# Patient Record
Sex: Female | Born: 1935 | Race: White | Hispanic: No | Marital: Married | Smoking: Never smoker
Health system: Southern US, Community
[De-identification: ages and names within clinical notes are randomized; demographics above are authoritative.]

## PROBLEM LIST (undated history)

## (undated) ENCOUNTER — Ambulatory Visit: Admission: EM | Payer: Medicare Other | Source: Home / Self Care

## (undated) DIAGNOSIS — R112 Nausea with vomiting, unspecified: Secondary | ICD-10-CM

## (undated) DIAGNOSIS — E785 Hyperlipidemia, unspecified: Secondary | ICD-10-CM

## (undated) DIAGNOSIS — N183 Chronic kidney disease, stage 3 unspecified: Secondary | ICD-10-CM

## (undated) DIAGNOSIS — F0390 Unspecified dementia without behavioral disturbance: Secondary | ICD-10-CM

## (undated) DIAGNOSIS — G4730 Sleep apnea, unspecified: Secondary | ICD-10-CM

## (undated) DIAGNOSIS — I10 Essential (primary) hypertension: Secondary | ICD-10-CM

## (undated) DIAGNOSIS — M1990 Unspecified osteoarthritis, unspecified site: Secondary | ICD-10-CM

## (undated) DIAGNOSIS — IMO0002 Reserved for concepts with insufficient information to code with codable children: Secondary | ICD-10-CM

## (undated) DIAGNOSIS — G43909 Migraine, unspecified, not intractable, without status migrainosus: Secondary | ICD-10-CM

## (undated) DIAGNOSIS — L570 Actinic keratosis: Secondary | ICD-10-CM

## (undated) DIAGNOSIS — R51 Headache: Secondary | ICD-10-CM

## (undated) DIAGNOSIS — I872 Venous insufficiency (chronic) (peripheral): Secondary | ICD-10-CM

## (undated) DIAGNOSIS — K449 Diaphragmatic hernia without obstruction or gangrene: Secondary | ICD-10-CM

## (undated) DIAGNOSIS — I43 Cardiomyopathy in diseases classified elsewhere: Secondary | ICD-10-CM

## (undated) DIAGNOSIS — I428 Other cardiomyopathies: Secondary | ICD-10-CM

## (undated) DIAGNOSIS — I4819 Other persistent atrial fibrillation: Secondary | ICD-10-CM

## (undated) DIAGNOSIS — I4892 Unspecified atrial flutter: Secondary | ICD-10-CM

## (undated) DIAGNOSIS — I4821 Permanent atrial fibrillation: Secondary | ICD-10-CM

## (undated) DIAGNOSIS — Z9889 Other specified postprocedural states: Secondary | ICD-10-CM

## (undated) DIAGNOSIS — R55 Syncope and collapse: Secondary | ICD-10-CM

## (undated) DIAGNOSIS — R000 Tachycardia, unspecified: Secondary | ICD-10-CM

## (undated) DIAGNOSIS — K219 Gastro-esophageal reflux disease without esophagitis: Secondary | ICD-10-CM

## (undated) DIAGNOSIS — R079 Chest pain, unspecified: Secondary | ICD-10-CM

## (undated) DIAGNOSIS — I441 Atrioventricular block, second degree: Secondary | ICD-10-CM

## (undated) DIAGNOSIS — M898X9 Other specified disorders of bone, unspecified site: Secondary | ICD-10-CM

## (undated) DIAGNOSIS — R42 Dizziness and giddiness: Secondary | ICD-10-CM

## (undated) HISTORY — DX: Sleep apnea, unspecified: G47.30

## (undated) HISTORY — PX: COMBINED AUGMENTATION MAMMAPLASTY AND ABDOMINOPLASTY: SUR291

## (undated) HISTORY — DX: Other cardiomyopathies: I42.8

## (undated) HISTORY — DX: Diaphragmatic hernia without obstruction or gangrene: K44.9

## (undated) HISTORY — PX: CARDIOVERSION: SHX1299

## (undated) HISTORY — DX: Chronic kidney disease, stage 3 unspecified: N18.30

## (undated) HISTORY — DX: Hyperlipidemia, unspecified: E78.5

## (undated) HISTORY — DX: Atrioventricular block, second degree: I44.1

## (undated) HISTORY — DX: Essential (primary) hypertension: I10

## (undated) HISTORY — DX: Chronic kidney disease, stage 3 (moderate): N18.3

## (undated) HISTORY — DX: Cardiomyopathy in diseases classified elsewhere: R00.0

## (undated) HISTORY — DX: Reserved for concepts with insufficient information to code with codable children: IMO0002

## (undated) HISTORY — DX: Migraine, unspecified, not intractable, without status migrainosus: G43.909

## (undated) HISTORY — DX: Other specified disorders of bone, unspecified site: M89.8X9

## (undated) HISTORY — DX: Venous insufficiency (chronic) (peripheral): I87.2

## (undated) HISTORY — PX: TOTAL KNEE ARTHROPLASTY: SHX125

## (undated) HISTORY — PX: CARDIAC CATHETERIZATION: SHX172

## (undated) HISTORY — DX: Actinic keratosis: L57.0

## (undated) HISTORY — DX: Syncope and collapse: R55

## (undated) HISTORY — DX: Other persistent atrial fibrillation: I48.19

## (undated) HISTORY — PX: APPENDECTOMY: SHX54

## (undated) HISTORY — PX: NOSE SURGERY: SHX723

## (undated) HISTORY — DX: Tachycardia, unspecified: I43

## (undated) HISTORY — DX: Permanent atrial fibrillation: I48.21

## (undated) HISTORY — DX: Headache: R51

## (undated) HISTORY — PX: OTHER SURGICAL HISTORY: SHX169

## (undated) HISTORY — DX: Unspecified atrial flutter: I48.92

## (undated) HISTORY — PX: CHOLECYSTECTOMY: SHX55

---

## 1984-12-20 HISTORY — PX: MASTECTOMY: SHX3

## 1984-12-20 HISTORY — PX: AUGMENTATION MAMMAPLASTY: SUR837

## 1988-12-20 HISTORY — PX: AUGMENTATION MAMMAPLASTY: SUR837

## 1988-12-20 HISTORY — PX: ABDOMINAL HYSTERECTOMY: SHX81

## 2003-11-30 ENCOUNTER — Ambulatory Visit (HOSPITAL_COMMUNITY): Admission: RE | Admit: 2003-11-30 | Discharge: 2003-11-30 | Payer: Self-pay | Admitting: Internal Medicine

## 2004-05-14 ENCOUNTER — Other Ambulatory Visit: Payer: Self-pay

## 2004-05-15 ENCOUNTER — Other Ambulatory Visit: Payer: Self-pay

## 2004-12-24 ENCOUNTER — Ambulatory Visit: Payer: Self-pay | Admitting: Otolaryngology

## 2005-01-19 ENCOUNTER — Ambulatory Visit: Payer: Self-pay | Admitting: Otolaryngology

## 2005-01-27 ENCOUNTER — Ambulatory Visit: Payer: Self-pay | Admitting: Otolaryngology

## 2005-05-06 ENCOUNTER — Ambulatory Visit: Payer: Self-pay | Admitting: Internal Medicine

## 2005-08-04 ENCOUNTER — Emergency Department: Payer: Self-pay | Admitting: Unknown Physician Specialty

## 2005-08-04 ENCOUNTER — Other Ambulatory Visit: Payer: Self-pay

## 2005-11-22 ENCOUNTER — Ambulatory Visit: Payer: Self-pay | Admitting: Pulmonary Disease

## 2005-12-16 ENCOUNTER — Ambulatory Visit: Payer: Self-pay | Admitting: Pulmonary Disease

## 2006-03-08 ENCOUNTER — Observation Stay (HOSPITAL_COMMUNITY): Admission: AD | Admit: 2006-03-08 | Discharge: 2006-03-09 | Payer: Self-pay | Admitting: Cardiovascular Disease

## 2006-03-16 ENCOUNTER — Ambulatory Visit: Payer: Self-pay | Admitting: Unknown Physician Specialty

## 2006-03-21 ENCOUNTER — Ambulatory Visit: Payer: Self-pay | Admitting: Unknown Physician Specialty

## 2006-03-23 ENCOUNTER — Ambulatory Visit: Payer: Self-pay | Admitting: Unknown Physician Specialty

## 2006-05-13 ENCOUNTER — Ambulatory Visit: Payer: Self-pay | Admitting: Internal Medicine

## 2006-10-27 ENCOUNTER — Ambulatory Visit: Payer: Self-pay

## 2006-11-16 ENCOUNTER — Ambulatory Visit: Payer: Self-pay | Admitting: General Practice

## 2007-03-23 ENCOUNTER — Ambulatory Visit: Payer: Self-pay | Admitting: Specialist

## 2007-10-27 ENCOUNTER — Ambulatory Visit: Payer: Self-pay | Admitting: Internal Medicine

## 2008-03-30 ENCOUNTER — Other Ambulatory Visit: Payer: Self-pay

## 2008-03-30 ENCOUNTER — Emergency Department: Payer: Self-pay | Admitting: Internal Medicine

## 2008-06-13 ENCOUNTER — Encounter: Payer: Self-pay | Admitting: Pulmonary Disease

## 2008-06-13 ENCOUNTER — Ambulatory Visit (HOSPITAL_COMMUNITY): Admission: RE | Admit: 2008-06-13 | Discharge: 2008-06-13 | Payer: Self-pay | Admitting: *Deleted

## 2008-06-13 ENCOUNTER — Encounter (INDEPENDENT_AMBULATORY_CARE_PROVIDER_SITE_OTHER): Payer: Self-pay | Admitting: *Deleted

## 2008-06-27 ENCOUNTER — Encounter: Payer: Self-pay | Admitting: Pulmonary Disease

## 2008-11-27 ENCOUNTER — Ambulatory Visit: Payer: Self-pay | Admitting: Internal Medicine

## 2009-03-05 ENCOUNTER — Ambulatory Visit: Payer: Self-pay | Admitting: Pulmonary Disease

## 2009-03-05 DIAGNOSIS — R519 Headache, unspecified: Secondary | ICD-10-CM | POA: Insufficient documentation

## 2009-03-05 DIAGNOSIS — I10 Essential (primary) hypertension: Secondary | ICD-10-CM | POA: Insufficient documentation

## 2009-03-05 DIAGNOSIS — R51 Headache: Secondary | ICD-10-CM | POA: Insufficient documentation

## 2009-03-05 DIAGNOSIS — R0602 Shortness of breath: Secondary | ICD-10-CM | POA: Insufficient documentation

## 2009-03-05 LAB — CONVERTED CEMR LAB
Basophils Absolute: 0 10*3/uL (ref 0.0–0.1)
Basophils Relative: 0.3 % (ref 0.0–3.0)
Eosinophils Absolute: 0.1 10*3/uL (ref 0.0–0.7)
Eosinophils Relative: 1.4 % (ref 0.0–5.0)
HCT: 39.6 % (ref 36.0–46.0)
Hemoglobin: 13.8 g/dL (ref 12.0–15.0)
Lymphocytes Relative: 28.5 % (ref 12.0–46.0)
Lymphs Abs: 2.1 10*3/uL (ref 0.7–4.0)
MCHC: 34.9 g/dL (ref 30.0–36.0)
MCV: 93.8 fL (ref 78.0–100.0)
Monocytes Absolute: 0.4 10*3/uL (ref 0.1–1.0)
Monocytes Relative: 5.2 % (ref 3.0–12.0)
Neutro Abs: 4.6 10*3/uL (ref 1.4–7.7)
Neutrophils Relative %: 64.6 % (ref 43.0–77.0)
Platelets: 198 10*3/uL (ref 150.0–400.0)
Pro B Natriuretic peptide (BNP): 398 pg/mL — ABNORMAL HIGH (ref 0.0–100.0)
RBC: 4.22 M/uL (ref 3.87–5.11)
RDW: 12.1 % (ref 11.5–14.6)
TSH: 0.79 microintl units/mL (ref 0.35–5.50)
WBC: 7.2 10*3/uL (ref 4.5–10.5)

## 2009-06-05 ENCOUNTER — Ambulatory Visit: Payer: Self-pay | Admitting: Cardiovascular Disease

## 2009-08-18 ENCOUNTER — Emergency Department: Payer: Self-pay | Admitting: Emergency Medicine

## 2009-12-20 HISTORY — PX: AUGMENTATION MAMMAPLASTY: SUR837

## 2010-03-17 ENCOUNTER — Ambulatory Visit: Payer: Self-pay | Admitting: Cardiovascular Disease

## 2010-03-18 ENCOUNTER — Encounter: Payer: Self-pay | Admitting: Cardiovascular Disease

## 2010-03-18 LAB — CONVERTED CEMR LAB
INR: 2.25
INR: 2.25 — ABNORMAL HIGH (ref <1.50)
Prothrombin Time: 24.7 s
Prothrombin Time: 24.7 s — ABNORMAL HIGH (ref 11.6–15.2)

## 2010-04-01 ENCOUNTER — Telehealth: Payer: Self-pay | Admitting: Cardiovascular Disease

## 2010-04-02 DIAGNOSIS — Z8679 Personal history of other diseases of the circulatory system: Secondary | ICD-10-CM | POA: Insufficient documentation

## 2010-04-08 ENCOUNTER — Ambulatory Visit: Payer: Self-pay | Admitting: Internal Medicine

## 2010-04-15 ENCOUNTER — Ambulatory Visit: Payer: Self-pay | Admitting: Cardiovascular Disease

## 2010-04-15 ENCOUNTER — Encounter: Payer: Self-pay | Admitting: Cardiovascular Disease

## 2010-04-16 ENCOUNTER — Encounter: Payer: Self-pay | Admitting: Cardiovascular Disease

## 2010-04-16 LAB — CONVERTED CEMR LAB
INR: 1.74 — ABNORMAL HIGH (ref <1.50)
POC INR: 1.74
Prothrombin Time: 20.2 s — ABNORMAL HIGH (ref 11.6–15.2)

## 2010-04-29 ENCOUNTER — Ambulatory Visit: Payer: Self-pay | Admitting: Cardiovascular Disease

## 2010-04-30 LAB — CONVERTED CEMR LAB
INR: 1.93 — ABNORMAL HIGH (ref <1.50)
POC INR: 1.93
Prothrombin Time: 21.9 s
Prothrombin Time: 21.9 s — ABNORMAL HIGH (ref 11.6–15.2)

## 2010-05-21 ENCOUNTER — Ambulatory Visit: Payer: Self-pay | Admitting: Cardiovascular Disease

## 2010-05-22 LAB — CONVERTED CEMR LAB
Cholesterol: 190 mg/dL (ref 0–200)
HDL: 58 mg/dL (ref >39)
INR: 2.15 — ABNORMAL HIGH (ref <1.50)
LDL Cholesterol: 115 mg/dL — ABNORMAL HIGH (ref 0–99)
POC INR: 2.15
Prothrombin Time: 23.8 s
Prothrombin Time: 23.8 s — ABNORMAL HIGH (ref 11.6–15.2)
Total CHOL/HDL Ratio: 3.3
Triglycerides: 84 mg/dL (ref <150)
VLDL: 17 mg/dL (ref 0–40)

## 2010-06-19 ENCOUNTER — Ambulatory Visit: Payer: Self-pay | Admitting: Cardiovascular Disease

## 2010-07-01 LAB — CONVERTED CEMR LAB
INR: 2.29 — ABNORMAL HIGH (ref <1.50)
POC INR: 2.29
Prothrombin Time: 25 s
Prothrombin Time: 25 s — ABNORMAL HIGH (ref 11.6–15.2)

## 2010-07-06 ENCOUNTER — Encounter: Payer: Self-pay | Admitting: Cardiovascular Disease

## 2010-07-08 ENCOUNTER — Ambulatory Visit: Payer: Self-pay | Admitting: Cardiovascular Disease

## 2010-07-08 DIAGNOSIS — E785 Hyperlipidemia, unspecified: Secondary | ICD-10-CM | POA: Insufficient documentation

## 2010-07-16 ENCOUNTER — Telehealth: Payer: Self-pay | Admitting: Cardiovascular Disease

## 2010-07-22 ENCOUNTER — Emergency Department: Payer: Self-pay | Admitting: Emergency Medicine

## 2010-07-22 ENCOUNTER — Encounter: Payer: Self-pay | Admitting: Cardiovascular Disease

## 2010-07-24 ENCOUNTER — Ambulatory Visit: Payer: Self-pay | Admitting: Cardiovascular Disease

## 2010-07-29 ENCOUNTER — Telehealth (INDEPENDENT_AMBULATORY_CARE_PROVIDER_SITE_OTHER): Payer: Self-pay

## 2010-07-29 ENCOUNTER — Encounter: Payer: Self-pay | Admitting: Cardiovascular Disease

## 2010-08-04 ENCOUNTER — Ambulatory Visit: Payer: Self-pay

## 2010-09-22 ENCOUNTER — Emergency Department: Payer: Self-pay | Admitting: Emergency Medicine

## 2010-10-22 ENCOUNTER — Ambulatory Visit: Payer: Self-pay | Admitting: Cardiovascular Disease

## 2010-10-22 ENCOUNTER — Telehealth: Payer: Self-pay | Admitting: Cardiovascular Disease

## 2010-11-30 ENCOUNTER — Telehealth: Payer: Self-pay | Admitting: Cardiovascular Disease

## 2010-12-28 ENCOUNTER — Telehealth: Payer: Self-pay | Admitting: Cardiovascular Disease

## 2010-12-31 ENCOUNTER — Ambulatory Visit
Admission: RE | Admit: 2010-12-31 | Discharge: 2010-12-31 | Payer: Self-pay | Source: Home / Self Care | Attending: Cardiovascular Disease | Admitting: Cardiovascular Disease

## 2011-01-09 ENCOUNTER — Encounter: Payer: Self-pay | Admitting: Internal Medicine

## 2011-01-10 ENCOUNTER — Encounter: Payer: Self-pay | Admitting: Internal Medicine

## 2011-01-11 ENCOUNTER — Telehealth: Payer: Self-pay | Admitting: Cardiovascular Disease

## 2011-01-14 ENCOUNTER — Ambulatory Visit
Admission: RE | Admit: 2011-01-14 | Discharge: 2011-01-14 | Payer: Self-pay | Source: Home / Self Care | Attending: Cardiovascular Disease | Admitting: Cardiovascular Disease

## 2011-01-15 NOTE — Assessment & Plan Note (Signed)
[facility]                       [place] CARDIOLOGY OFFICE NOTE  TEREZ, MONTEE                      MRN:          [id number] DATE:01/14/2011                            DOB:          01-Nov-1936   Ms. Lauf is a 75 year old female who is here today for a followup visit.  She has the following problem list: 1. Atrial fibrillation status post cardioversion twice in the past. 2. 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  Ejection fraction has normalized after conversion     to sinus rhythm. 3. Labile hypertension. 4. Hyperlipidemia. 5. No history of coronary artery disease per cardiac catheterization     in 2007 at [facility].  INTERIM HISTORY:  The patient is here today for a followup visit after recent adjustment in her medications.  During her last visit, I decreased carvedilol to 3.125 mg twice daily due to significant bradycardia.  I also added losartan 50 mg once daily.  Overall, she feels better than before.  Her dizziness and lightheadedness has resolved.  She is still having high blood pressure readings but reviewing her home readings, it seems like her blood pressure is very labile.  In one day, her blood pressure was 175/94 and subsequently went down to 108/59.  Overall, the readings are improved since the last visit.  MEDICATIONS: 1. Fish oil 2 once daily. 2. Red yeast rice. 3. Aspirin 81 mg once daily. 4. Prilosec 20 mg once daily. 5. Amiodarone 200 mg once daily. 6. Carvedilol 3.125 mg twice daily. 7. Losartan 50 mg once daily which will be increased to twice daily.  PHYSICAL EXAMINATION:  VITAL SIGNS:  Weight is 172.6 pounds, blood pressure is 127/70, pulse is 50, oxygen saturation is 97% on room air. NECK:  No JVD or carotid bruits. LUNGS:  Clear to auscultation. HEART:  Regular rate and rhythm with no  gallops or murmurs. ABDOMEN:  Benign, nontender, nondistended. EXTREMITIES:  No clubbing, cyanosis or edema.  IMPRESSION: 1. Labile hypertension.  She is overall doing better than before.  Her     heart rate and bradycardia has improved since decreasing her     carvedilol.  She is still having high readings of blood pressure     but overall, it tends to fluctuate significantly throughout the     day.  I think it is important to space her medications in multiple     dosages.  Thus, I will go ahead and increase losartan but make it     twice daily.  She will be taking 50 mg twice daily for a total 100     mg daily.  We will continue with carvedilol 3.125 mg twice daily.     If her blood pressure remains elevated on this regimen, I recommend     adding a small dose amlodipine. 2. Atrial fibrillation.  She is currently in normal sinus rhythm with     amiodarone.  Amiodarone was chosen in the past due to her reduced  ejection fraction.  However, I think we might be able to revisit     this issue after checking her cardiac status in the near future and     see if we can switch her to an antiarrhythmic medication with less     side effects.  She seems to be tolerating it well.  I again     discussed the long-term importance of anticoagulation.  She was     actually doing reasonably well with warfarin but stopped taking the     medication due to inconvenience.  I discussed with her Pradaxa and     she will check with her insurance company to see if it is covered.     She will follow up in few months.    Lorine Bears, MD Electronically Signed   MA/MedQ  DD: 01/14/2011  DT: 01/15/2011  Job #: [id number]

## 2011-01-19 NOTE — Assessment & Plan Note (Signed)
Summary: Increased BP, Shortness of Breath   Visit Type:  Follow-up Primary Provider:  Valentina [name]  CC:  c/o increased blood pressure and shortness of breath, headache, and nausea and dizziness.  Blood pressure yesterday BP 189/105 with heart rate of 70.  Today she has some pain in shoulder blades..  History of Present Illness: 75 year old woman with a history of HTN, atrial fibrillation, cardioversion in May of 2009, recurrent atrial fibrillation with cardioversion in June 2010 with no further episodes of atrial fibrillation over the past year, cardiac catheterization in 2007 showing no significant coronary artery disease who presents for routine followup.  [name] [name] reports that recently she has been having difficulty sleeping. She has significant stress. She is snoring and has always snored. Previously she had a test for sleep apnea and initially used oxygen though this was then stopped. She has been snoring more recently and is uncertain if this is secondary to allergies. She has been waking up with a headache, and is hypertensive when she checks her pressures with systolic pressures greater than 170. She's had to take extra lisinopril and nitroglycerin sublingual to improve her blood pressure.  Prior to this, her blood pressure was well-controlled  She reports that she had a mastectomy in 1988, had a silicone implant changed to saline implant several years late.   on August 3, she developed a severe neck discomfort, severe headache, nausea vomiting times several episodes. Blood pressure was 170 systolic. She was adjusted by a chiropractor who do to her elevated blood pressure, she was referred to the emergency room. There she felt weak, had headache and felt dizzy. In the hospital, she had an MRI was essentially negative. Chest x-ray was normal. CT of the head did not show any acute process.  Transesophageal echo performed in 2009 shows mildly dilated left atrium, no significant mitral valve  disease, normal systolic function  Cholesterol in October 2010 shows total cholesterol 222, LDL 154, HDL 57, creatinine 0.9.  EKG shows sinus bradycardia with rate 51 beats per minute, T-wave abnormality in V3 through V6, 2, 3, aVF  Current Medications (verified): 1)  Fish Oil  Oil (Fish Oil) .[name].. 1 Tsp Once Daily--Hold For Surg. 2)  Red Yeast Rice 600 Mg Caps (Red Yeast Rice Extract) .... Take 4 Tabs By Mouth Daily 3)  Aspir-Low 81 Mg Tbec (Aspirin) .[name].. 1 Once Daily--Hold For Surg. 4)  Omeprazole 20 Mg Tbec (Omeprazole) .... Take 1 Tablet By Mouth Once A Day 5)  Amiodarone Hcl 200 Mg Tabs (Amiodarone Hcl) .[name].. 1 Once Daily 6)  Tramadol Hcl 50 Mg Tabs (Tramadol Hcl) .... As Needed For Pain 7)  Carvedilol 6.25 Mg Tabs (Carvedilol) .... One Tablet Every A.m. and Two Tablets in The P.m. 8)  Lisinopril 10 Mg Tabs (Lisinopril) .... Take 1/2  Tablet in The A.m. As Needed and One Tablet in The P.m.  Allergies (verified): 1)  ! Iodine 2)  ! Codeine 3)  ! Coumadin  Past History:  Past Medical History: Last updated: 07/08/2010 ATRIAL FIBRILLATION, PAROXYSMAL, HX OF (ICD-V12.50) CARDIOMYOPATHY (ICD-425.4) DYSPNEA (ICD-786.05) HEADACHE, CHRONIC (ICD-784.0) HYPERTENSION (ICD-401.9) Cardioversion x 2  Past Surgical History: Last updated: 07/08/2010 s/p cholecystectomy s/p appendectomy, hysterectomy h/o multiple orthopedic procedures. s/p bilateral mastectomy with breast implants.  Family History: Last updated: 03/05/2009 heart disease: mother cancer: father (stomach), 2 sisters (breast) maternal grandmother (breast)   Social History: Last updated: 03/05/2009 Patient never smoked.  pt is married with children. pt is retired. 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  Risk Factors: Smoking Status: never (03/05/2009)  Review of Systems  The patient denies fever, weight loss, weight gain, vision loss, decreased hearing, hoarseness, chest pain,  syncope, dyspnea on exertion, peripheral edema, prolonged cough, abdominal pain, incontinence, muscle weakness, depression, and enlarged lymph nodes.         Headache, HTN  Vital Signs:  Patient profile:   75 year old female Height:      64 inches Weight:      174 pounds BMI:     29.97 Pulse rate:   51 / minute BP supine:   120 / 62 BP sitting:   120 / 68  (left arm) BP standing:   120 / 70 Cuff size:   regular  Vitals Entered By: [name] [name], CMA (October 22, 2010 10:49 AM)  Physical Exam  General:  Well developed, well nourished, in no acute distress. Head:  normocephalic and atraumatic Neck:  Neck supple, no JVD. No masses, thyromegaly or abnormal cervical nodes. Lungs:  Clear bilaterally to auscultation and percussion. Heart:  Non-displaced PMI, chest non-tender; regular rate and rhythm, S1, S2 with I-II/VI SEM RSB, no rubs or gallops. Carotid upstroke normal, no bruit. Pedals normal pulses. No edema, no varicosities. Abdomen:  Bowel sounds positive; abdomen soft and non-tender without masses Msk:  Back normal, normal gait. Muscle strength and tone normal. Pulses:  pulses normal in all 4 extremities Extremities:  No clubbing or cyanosis. Neurologic:  Alert and oriented x 3.   Impression & Recommendations:  Problem # 1:  HYPERTENSION (ICD-401.9) because of her hypertension could be secondary to poor sleep or her headaches. Typically she has very well-controlled blood pressure. We have given her a prescription for hydralazine which she can take p.r.n. for hypertension. She will try an antihistamine/zyrtec to see if this helps her nasal congestion and her sleep hygiene. If she continues to have trouble with her sleep, we could try a nocturnal oximetry monitor.  Her updated medication list for this problem includes:    Aspir-low 81 Mg Tbec (Aspirin) .[name]... 1 once daily--hold for surg.    Carvedilol 6.25 Mg Tabs (Carvedilol) ..... One tablet every a.m. and two tablets in  the p.m.    Lisinopril 10 Mg Tabs (Lisinopril) .[name]... Take 1/2  tablet in the a.m. as needed and one tablet in the p.m.    Hydralazine Hcl 25 Mg Tabs (Hydralazine hcl) .[name]... Take one tablet by mouth three times a day as needed for for blood pressure greater than 150/90  Problem # 2:  HYPERLIPIDEMIA-MIXED (ICD-272.4) She is on red yeast rice. She will consider low-dose generic cholesterol medication.  Problem # 3:  ATRIAL FIBRILLATION, PAROXYSMAL, HX OF (ICD-V12.50) no recent episodes of atrial fibrillation. We'll continue her on her current medication regimen. Continue low-dose amiodarone and carvedilol. She is taking Coreg x2 at nighttime and one dose in the morning. We did suggest that she could take one dose in the evening given her bradycardia.  Problem # 4:  HEADACHE, CHRONIC (ICD-784.0) etiology of her headaches is uncertain. This could be secondary to the hypertension or secondary to poor sleep and underlying stress.  Her updated medication list for this problem includes:    Aspir-low 81 Mg Tbec (Aspirin) .[name]... 1 once daily--hold for surg.    Tramadol Hcl 50 Mg Tabs (Tramadol hcl) .[name]... As needed for pain    Carvedilol 6.25 Mg Tabs (Carvedilol) ..... One tablet every a.m. and two tablets in the p.m.  Patient Instructions: 1)  Your physician has recommended you make the following change in your medication: START hydralazine 25mg  three times a day as needed for blood pressure greater150/90.  2)  Your physician wants you to follow-up in:   6 months You will receive a reminder letter in the mail two months in advance. If you don't receive a letter, please call our office to schedule the follow-up appointment. Prescriptions: HYDRALAZINE HCL 25 MG TABS (HYDRALAZINE HCL) Take one tablet by mouth three times a day as needed for for blood pressure greater than 150/90  #90 x 6   Entered by:   Benedict Needy, RN   Authorized by:   Dossie Arbour MD   Signed by:   Benedict Needy, RN on  10/22/2010   Method used:   Electronically to        [facility] [phone]* (retail)       [address] [place], [place]  [address]       Ph: [phone]       Fax: [phone]   RxID:   [id number]

## 2011-01-19 NOTE — Progress Notes (Signed)
Summary: Follow-up on Coumadin/Off Coumadin at present  Phone Note Outgoing Call   Call placed by: Cloyde Reams RN,  July 29, 2010 2:37 PM Call placed to: Patient Summary of Call: Pt is past due for Coumadin follow-up.  Called pt, she states she is currently off Coumadin per Dr Mariah Milling, pending breast surgery on 08/04/10.  Pt states Dr Mariah Milling took her off Coumadin, and she is hoping to stay off.  She is currently in NSR, and Dr Mariah Milling advised pt he will continue to monitor and she may not need to resume Coumadin.  Since pt is off Coumadin at the present and unsure if she will resume, pt has been instructed to call us back for f/u if she does in fact resume Coumadin.  Pt Agrees.  Initial call taken by: Cloyde Reams RN,  July 29, 2010 2:40 PM

## 2011-01-19 NOTE — Assessment & Plan Note (Signed)
Summary: ROV/GLC   Visit Type:  Follow-up Primary Provider:  [name]  CC:  Needs a cardiac clearance for surg.(breast surg. implants). Has spells of decrease BP and feels like could pass out at times..  History of Present Illness: [name] [name] is a very pleasant 75 year old woman with a history of HTN, atrial fibrillation, cardioversion in May of 2009, recurrent atrial fibrillation and temperature doesn't 10 with cardioversion in June 2010 with no further episodes of age fibrillation over the past year, cardiac catheterization in 2007 showing no significant coronary artery disease who presents for preoperative evaluation prior to breast surgery.  She reports that she had a mastectomy in 1988, had a silicone implant changed to saline implant several years later and is now due to have a repair of her saline implants.  At baseline, she has no symptoms. She can feel her atrial fibrillation when she has it and has not had any sensations over the past year. She would like to come off warfarin if she can. She is active, denies any significant shortness of breath, chest pain. She does have a history of eye bleeding on warfarin.  she states that she has been taking carvedilol 9 mg every evening, lisinopril 10 mg in the a.m. With that her blood pressure has been dropping too low at times and she feels fatigue. She has cut the lisinopril back to 5 mg and feels a little better.  Transesophageal echo performed in 2009 shows mildly dilated left atrium, no significant mitral valve disease, normal systolic function  Cholesterol in October 2010 shows total cholesterol 222, LDL 154, HDL 57, creatinine 0.9.  EKG July 01 2010 shows normal sinus rhythm with rate 57 beats per minute, nonspecific T wave abnormality in leads V5, V6  EKG on today's visit shows sinus bradycardia with rate 53 beats per minute, no specific abnormalities noted, no ST or T wave changes  Current Medications (verified): 1)  Fish Oil  Oil  (Fish Oil) .[name].. 1 Tsp Once Daily 2)  Red Yeast Rice 600 Mg Caps (Red Yeast Rice Extract) .... Take 4 Tabs By Mouth Daily 3)  Aspir-Low 81 Mg Tbec (Aspirin) .[name].. 1 Once Daily 4)  Diltiazem Hcl Cr 180 Mg Xr24h-Cap (Diltiazem Hcl) .... Take 1 Tablet By Mouth Once A Day 5)  Omeprazole 20 Mg Tbec (Omeprazole) .... Take 1 Tablet By Mouth Once A Day 6)  Darvocet-N 100 100-650 Mg Tabs (Propoxyphene N-Apap) .... Take By Mouth As Needed 7)  Vitamin D 09323 Unit Caps (Ergocalciferol) .... Take 1 Tablet By Mouth Once A Day 8)  Amiodarone Hcl 200 Mg Tabs (Amiodarone Hcl) .[name].. 1 Once Daily 9)  Tramadol Hcl 50 Mg Tabs (Tramadol Hcl) .... As Needed For Pain 10)  Coumadin 4 Mg Tabs (Warfarin Sodium) .... Take As Directed 11)  Carvedilol 6.25 Mg Tabs (Carvedilol) .[name].. 1 and Half At Bedtime  Allergies (verified): 1)  ! Iodine 2)  ! Codeine 3)  ! Coumadin  Past History:  Family History: Last updated: 03/05/2009 heart disease: mother cancer: father (stomach), 2 sisters (breast) maternal grandmother (breast)   Social History: Last updated: 03/05/2009 Patient never smoked.  pt is married with children. pt is retired. 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  Risk Factors: Smoking Status: never (03/05/2009)  Past Medical History: ATRIAL FIBRILLATION, PAROXYSMAL, HX OF (ICD-V12.50) CARDIOMYOPATHY (ICD-425.4) DYSPNEA (ICD-786.05) HEADACHE, CHRONIC (ICD-784.0) HYPERTENSION (ICD-401.9) Cardioversion x 2  Past Surgical History: s/p cholecystectomy s/p appendectomy, hysterectomy h/o multiple orthopedic procedures.  s/p bilateral mastectomy with breast implants.  Vital Signs:  Patient profile:   75 year old female Height:      64 inches Weight:      168 pounds BMI:     28.94 Pulse rate:   54 / minute BP sitting:   147 / 83  (left arm) Cuff size:   regular  Vitals Entered By: Bishop Dublin, CMA (July 08, 2010 3:26 PM)  Physical Exam  General:  Well developed,  well nourished, in no acute distress. Head:  normocephalic and atraumatic Neck:  Neck supple, no JVD. No masses, thyromegaly or abnormal cervical nodes. Lungs:  Clear bilaterally to auscultation and percussion. Heart:  Non-displaced PMI, chest non-tender; regular rate and rhythm, S1, S2 without murmurs, rubs or gallops. Carotid upstroke normal, no bruit. Pedals normal pulses. No edema, no varicosities. Abdomen:  Bowel sounds positive; abdomen soft and non-tender without masses Msk:  Back normal, normal gait. Muscle strength and tone normal. Pulses:  pulses normal in all 4 extremities Extremities:  No clubbing or cyanosis. Neurologic:  Alert and oriented x 3. Skin:  Intact without lesions or rashes. Psych:  Normal affect.   Impression & Recommendations:  Problem # 1:  ATRIAL FIBRILLATION, PAROXYSMAL, HX OF (ICD-V12.50) she has maintained sinus rhythm by her report over the past year. EKG today and last week showed normal sinus rhythm. We have suggested to her that she could potentially come off her warfarin for the upcoming surgery and she did hold off on restarting her warfarin and we could closely monitor her rhythm.  I would continue her on her amiodarone to 200 mg daily. I've also suggested that she take her coreg 6.2 5 milligrams b.i.d.  She was taking 9 mg q.p.m. previously.  Orders: EKG w/ Interpretation (93000)  Problem # 2:  HYPERTENSION (ICD-401.9) She feels that her blood pressure is getting a little bit too low. She will take her carvedilol b.i.d., lisinopril 5 mg in the morning and monitor her pressure. 4 hypertension, she will increase her lisinopril to 10 mg.  The following medications were removed from the medication list:    Toprol Xl 25 Mg Xr24h-tab (Metoprolol succinate) .[name]... Take 1 tablet by mouth once a day Her updated medication list for this problem includes:    Aspir-low 81 Mg Tbec (Aspirin) .[name]... 1 once daily    Diltiazem Hcl Cr 180 Mg Xr24h-cap (Diltiazem  hcl) .[name]... Take 1 tablet by mouth once a day    Carvedilol 6.25 Mg Tabs (Carvedilol) .[name]... Take 1 tablet by mouth once a day    Lisinopril 5 Mg Tabs (Lisinopril) .[name]... Take one tablet by mouth daily  Problem # 3:  HYPERLIPIDEMIA-MIXED (ICD-272.4) She does have hyperlipidemia but she had a cardiac catheterization 4 years ago that showed no significant coronary artery disease.  We will recommend she stay on an aspirin after she discontinues her warfarin.  Problem # 4:  PRE-OPERATIVE CARDIAC EXAM (ICD-V72.81) she would be an acceptable risk for her upcoming breast surgery. She has no known cardiac history. She is currently in sinus rhythm. I went through very comfortable with her coming off her warfarin.  I would recommend given her history of atrial fibrillation that minimal IV fluids be used. Excess IV fluid may contribute to left atrial stretch and convert her back to atrial fibrillation.  The following medications were removed from the medication list:    Toprol Xl 25 Mg Xr24h-tab (Metoprolol succinate) .[name]... Take 1 tablet by mouth once a day Her  updated medication list for this problem includes:    Aspir-low 81 Mg Tbec (Aspirin) .[name]... 1 once daily    Diltiazem Hcl Cr 180 Mg Xr24h-cap (Diltiazem hcl) .[name]... Take 1 tablet by mouth once a day    Coumadin 4 Mg Tabs (Warfarin sodium) .[name]... Take as directed    Carvedilol 6.25 Mg Tabs (Carvedilol) .[name]... Take 1 tablet by mouth once a day    Lisinopril 5 Mg Tabs (Lisinopril) .[name]... Take one tablet by mouth daily  Patient Instructions: 1)  Your physician has recommended you make the following change in your medication: START carvedilol 6.25 two times a day and lisinopril 5mg  daily  2)  Your physician wants you to follow-up in:  6 months  You will receive a reminder letter in the mail two months in advance. If you don't receive a letter, please call our office to schedule the follow-up appointment.

## 2011-01-19 NOTE — Progress Notes (Signed)
Summary: REFILL lisinopril  Phone Note Refill Request   Refills Requested: Medication #1:  LISINOPRIL 5 MG TABS Take one tablet by mouth daily. PT WOULD LIKE THE 10MG  TABS AND WOULD CUT THEM IN HALF, SHE SAYS THAT SHE SOMETIMES NEEDS TO TAKE THE 10MG  TABLETS.  PLEASE CALL INTO [facility]  Initial call taken by: West Carbo,  July 16, 2010 2:13 PM  Follow-up for Phone Call        Rx faxed to pharmacy [facility]  Additional Follow-up for Phone Call Additional follow up Details #1::        Rx faxed to pharmacy Additional Follow-up by: Bishop Dublin, CMA,  July 16, 2010 3:38 PM    New/Updated Medications: LISINOPRIL 10 MG TABS (LISINOPRIL) 1/2- 1 tablet everyday. Prescriptions: LISINOPRIL 10 MG TABS (LISINOPRIL) 1/2- 1 tablet everyday.  #30 x 4   Entered by:   Bishop Dublin, CMA   Authorized by:   Dossie Arbour MD   Signed by:   Bishop Dublin, CMA on 07/16/2010   Method used:   Electronically to        [facility] [phone]* (retail)       [address] [place], [place]  [address]       Ph: [phone]       Fax: [phone]   RxID:   [id number]

## 2011-01-19 NOTE — Progress Notes (Signed)
Summary: RECORD RELEASE  RECORD RELEASE   Imported By: Frazier Butt Chriscoe 04/16/2010 11:39:59  _____________________________________________________________________  External Attachment:    Type:   Image     Comment:   External Document

## 2011-01-19 NOTE — Progress Notes (Signed)
Summary: Problems  Phone Note Call from Patient   Caller: Patient Call For: Gollan Summary of Call: Patient's blood pressure this week has been up as high as  190/101, HR 61 she took nitro  last night, feels dizzy and nauseated.  Would like RN to call her this morning asap.  Took Bp at 8:30 150/89, HR 58. She has shortness of breath and headache. Initial call taken by: West Carbo,  October 22, 2010 8:22 AM  Follow-up for Phone Call        Appt Scheduled Today Follow-up by: Bishop Dublin, CMA,  October 22, 2010 8:36 AM

## 2011-01-19 NOTE — Letter (Signed)
Summary: Historic Patient File  Historic Patient File   Imported By: West Carbo 07/09/2010 07:45:23  _____________________________________________________________________  External Attachment:    Type:   Image     Comment:   External Document

## 2011-01-19 NOTE — Assessment & Plan Note (Signed)
[facility]   Visit Type:  Follow-up Primary Provider:  [name]  CC:  c/o BP getting really high during the p.m. and low during the day with feeling dizzy when low BP reading. [name]  History of Present Illness: 75 year old woman with a history of HTN, atrial fibrillation, cardioversion in May of 2009, recurrent atrial fibrillation with cardioversion in June 2010 with no further episodes of atrial fibrillation over the past year, cardiac catheterization in 2007 showing no significant coronary artery disease who presents for routine followup.  She reports that she had a mastectomy in 1988, had a silicone implant changed to saline implant several years later and is now due to have a repair of her saline implants.  she reports that recently on August 3, she developed a severe neck discomfort, severe headache, nausea vomiting times several episodes. Blood pressure was 170 systolic. She was adjusted by a chiropractor who do to her elevated blood pressure, she was referred to the emergency room. There she felt weak, had headache and felt dizzy. In the hospital, she had an MRI was essentially negative. Chest x-ray was normal. CT of the head did not show any acute process.  Transesophageal echo performed in 2009 shows mildly dilated left atrium, no significant mitral valve disease, normal systolic function  Cholesterol in October 2010 shows total cholesterol 222, LDL 154, HDL 57, creatinine 0.9.  EKG July 01 2010 shows normal sinus rhythm with rate 57 beats per minute, nonspecific T wave abnormality in leads V5, V6  EKG today shows sinus bradycardia with rate 53 beats per minute, T-wave abnormality in leads 2, 3, aVF, V5, V6  Current Medications (verified): 1)  Fish Oil  Oil (Fish Oil) .[name].. 1 Tsp Once Daily--Hold For Surg. 2)  Red Yeast Rice 600 Mg Caps (Red Yeast Rice Extract) .... Take 4 Tabs By Mouth Daily 3)  Aspir-Low 81 Mg Tbec (Aspirin) .[name].. 1 Once Daily--Hold For Surg. 4)  Omeprazole  20 Mg Tbec (Omeprazole) .... Take 1 Tablet By Mouth Once A Day 5)  Amiodarone Hcl 200 Mg Tabs (Amiodarone Hcl) .[name].. 1 Once Daily 6)  Tramadol Hcl 50 Mg Tabs (Tramadol Hcl) .... As Needed For Pain 7)  Coumadin 4 Mg Tabs (Warfarin Sodium) .... Take As Directed--Hold For Sur. 8)  Carvedilol 6.25 Mg Tabs (Carvedilol) .... Take 1 Tablet By Mouth Once A Day 9)  Lisinopril 10 Mg Tabs (Lisinopril) .... 1/2- 1 Tablet Everyday.  Allergies (verified): 1)  ! Iodine 2)  ! Codeine 3)  ! Coumadin  Past History:  Past Medical History: Last updated: 07/08/2010 ATRIAL FIBRILLATION, PAROXYSMAL, HX OF (ICD-V12.50) CARDIOMYOPATHY (ICD-425.4) DYSPNEA (ICD-786.05) HEADACHE, CHRONIC (ICD-784.0) HYPERTENSION (ICD-401.9) Cardioversion x 2  Past Surgical History: Last updated: 07/08/2010 s/p cholecystectomy s/p appendectomy, hysterectomy h/o multiple orthopedic procedures. s/p bilateral mastectomy with breast implants.  Family History: Last updated: 03/05/2009 heart disease: mother cancer: father (stomach), 2 sisters (breast) maternal grandmother (breast)   Social History: Last updated: 03/05/2009 Patient never smoked.  pt is married with children. pt is retired. 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  Risk Factors: Smoking Status: never (03/05/2009)  Review of Systems  The patient denies fever, weight loss, weight gain, vision loss, decreased hearing, hoarseness, chest pain, syncope, dyspnea on exertion, peripheral edema, prolonged cough, abdominal pain, incontinence, muscle weakness, depression, and enlarged lymph nodes.         Recent headache, nausea, vomiting, dizzy, now resolved  Vital Signs:  Patient profile:   75  year old female Height:      64 inches Weight:      171 pounds BMI:     29.46 Pulse rate:   52 / minute BP sitting:   111 / 67  (left arm) Cuff size:   regular  Vitals Entered By: Bishop Dublin, CMA (July 24, 2010 11:13  AM)  Physical Exam  General:  Well developed, well nourished, in no acute distress. Head:  normocephalic and atraumatic Neck:  Neck supple, no JVD. No masses, thyromegaly or abnormal cervical nodes. Lungs:  Clear bilaterally to auscultation and percussion. Heart:  Non-displaced PMI, chest non-tender; regular rate and rhythm, S1, S2 without murmurs, rubs or gallops. Carotid upstroke normal, no bruit. Pedals normal pulses. No edema, no varicosities. Abdomen:  Bowel sounds positive; abdomen soft and non-tender without masses Msk:  Back normal, normal gait. Muscle strength and tone normal. Pulses:  pulses normal in all 4 extremities Extremities:  No clubbing or cyanosis. Neurologic:  Alert and oriented x 3. Skin:  Intact without lesions or rashes. Psych:  Normal affect.   Impression & Recommendations:  Problem # 1:  ATRIAL FIBRILLATION, PAROXYSMAL, HX OF (ICD-V12.50)  she has maintained sinus rhythm by her report over the past year. EKG today andprevious visits showed normal sinus rhythm. We have suggested to her that she could discontinue her warfarin.  I would continue her on her amiodarone to 200 mg daily. I've also suggested that she take her coreg 3.125 milligrams b.i.d.   Orders: EKG w/ Interpretation ([id number])  Problem # 2:  PRE-OPERATIVE CARDIAC EXAM (ICD-V72.81)  she would be an acceptable risk for her upcoming breast surgery. She has no known cardiac history. She is currently in sinus rhythm.  I would recommend given her history of atrial fibrillation that minimal IV fluids be used.   Her updated medication list for this problem includes:    Aspir-low 81 Mg Tbec (Aspirin) .[name]... 1 once daily    Diltiazem Hcl Cr 180 Mg Xr24h-cap (Diltiazem hcl) .[name]... Take 1 tablet by mouth once a day    Coumadin 4 Mg Tabs (Warfarin sodium) .[name]... Take as directed    Carvedilol 3.125 Mg Tabs (Carvedilol) .[name]... Take 1 tablet by mouth once a day    Lisinopril 5 Mg Tabs (Lisinopril)  .....BID  Problem # 3:  HYPERTENSION (ICD-401.9) blood pressure is somewhat labile. She is taking all of her medications at once. We will change both the choroid and lisinopril to b.i.d..  Her updated medication list for this problem includes:    Aspir-low 81 Mg Tbec (Aspirin) .[name]... 1 once daily--hold for surg.    Carvedilol 3.125 Mg Tabs (Carvedilol) ..... BID    Lisinopril 5 Mg Tabs (Lisinopril) .....  BID  Patient Instructions: 1)  Your physician recommends that you continue on your current medications as directed. Please refer to the Current Medication list given to you today. 2)  Your physician wants you to follow-up in:   6 months You will receive a reminder letter in the mail two months in advance. If you don't receive a letter, please call our office to schedule the follow-up appointment.

## 2011-01-19 NOTE — Medication Information (Signed)
Summary: [facility]  Anticoagulant Therapy  Managed by: Inactive Referring MD: Kirke Corin PCP: Bryson Corona MD: Shirlee Latch MD, [place] Indication 1: Atrial Fibrillation [place] Site: [place] INR RANGE 2.0-3.0          Comments: Pt is off Coumadin at present per Dr Mariah Milling for pending breast surgery on 08/04/10, pt unsure if she will resume after surgery.  Advised pt to call back for follow-up appt if she resumes Coumadin.  Pt agrees. Cloyde Reams RN  July 29, 2010 2:41 PM   Allergies: 1)  ! Iodine 2)  ! Codeine 3)  ! Coumadin  Anticoagulation Management History:      Positive risk factors for bleeding include an age of 73 years or older.  The bleeding index is 'intermediate risk'.  Positive CHADS2 values include History of HTN.  Negative CHADS2 values include Age > 71 years old.  Her last INR was 2.29.  Anticoagulation responsible provider: Shirlee Latch MD, [place].    Anticoagulation Management Assessment/Plan:      The patient's current anticoagulation dose is Coumadin 4 mg tabs: take as directed--hold for sur..  The target INR is 2.0-3.0.  The next INR is due 07/22/2010.  Anticoagulation instructions were given to patient.  Results were reviewed/authorized by Inactive.         Prior Anticoagulation Instructions: INR 2.29  Continue same dose of 1 tab daily. Recheck in 4 weeks.

## 2011-01-19 NOTE — Progress Notes (Signed)
Summary: PROBLEMS  Phone Note Call from Patient Call back at Home Phone [phone]   Caller: SELF Call For: Rockelle Heuerman Summary of Call: CARVEDILOL WAS RECENTLY CHANGED BY [facility]-PT THEN ELECTED TO TAKE 2 IN THE EVENING AND 1 IN THE MORNING-PT IS NOW FEELING WASHED OUT WITH LOW BP 111/75 AND 109/67-BP WAS 179/94 THE OTHER DAY WITH A SPLITTING HEADACHE AND SHE THREW UP. Initial call taken by: Harlon Flor,  April 01, 2010 3:18 PM  Follow-up for Phone Call        pt on coreg 6.25 mg two times a day. pt concerned that bp fluctuates so much.  when pt was taking 1 tab am/2 tab pm BP would be "low" during the day and she would feel washed out.  pt decided to try 1 am/1 pm.  BP would be too high in the evenings.  instructed pt to try taking 1.5 tab pm/ 1 tab am and let me know how this works.  will make f/u appt with [facility] if medications need further adjusting. Follow-up by: Charlena Cross, RN, BSN,  April 01, 2010 3:30 PM

## 2011-01-21 NOTE — Progress Notes (Signed)
Summary: Side Effects  Phone Note Call from Patient Call back at Home Phone [phone] Call back at Work Phone [phone]   Caller: Husband Call For: [name] [name] Summary of Call: Pt is getting SOB and lightheaded with Lisinopril. Initial call taken by: Harlon Flor,  December 28, 2010 9:33 AM  Follow-up for Phone Call        patient called back with BP readings of 155/90 last night and today 158/70 with heart rate 60.  She is S.O.B., lightheaded and has blurred vision.  She stopped the medication yesterday.  She was giving hydralazine three times a day but caused her to be nauseated.  What do you recommend her to take? Follow-up by: Bishop Dublin, CMA,  December 29, 2010 8:58 AM  Additional Follow-up for Phone Call Additional follow up Details #1::        Could try amlodipine 5 mg daily. May sure symptoms of SOB, lightheaded are not from sleep apnea or something else. Symptoms could even be from BP being too high. Would write down BP twice a day and what meds she is taking for [place]. Need number in 1 - 2 weeks      Appended Document: Side Effects Notified patient per Dr. Mariah Milling could try amlodipine 5 mg daily. I also told her symptoms may be coming from the sleep apnea or something else or even the BP being too high.  Told her to write down the BP twice a day for two weeks.  She has made an appt. with a pulmonlogist and will wait to see what he recommends first. Patient very anxious and very concerned with this matter.

## 2011-01-21 NOTE — Progress Notes (Signed)
Summary: RX  Phone Note Refill Request Call back at Home Phone [phone] Message from:  Patient on November 30, 2010 2:58 PM  Refills Requested: Medication #1:  CARVEDILOL 6.25 MG TABS one tablet every a.m. and two tablets in the p.m.  Medication #2:  AMIODARONE HCL 200 MG TABS 1 once daily [facility]  Initial call taken by: Harlon Flor,  November 30, 2010 2:59 PM    Prescriptions: CARVEDILOL 6.25 MG TABS (CARVEDILOL) one tablet every a.m. and two tablets in the p.m.  #90 x 6   Entered by:   Lysbeth Galas CMA   Authorized by:   Dossie Arbour MD   Signed by:   Lysbeth Galas CMA on 11/30/2010   Method used:   Electronically to        [facility] [phone]* (retail)       [address] [place], [place]  [address]       Ph: [phone]       Fax: [phone]   RxID:   [id number] AMIODARONE HCL 200 MG TABS (AMIODARONE HCL) 1 once daily  #30 x 6   Entered by:   Lysbeth Galas CMA   Authorized by:   Dossie Arbour MD   Signed by:   Lysbeth Galas CMA on 11/30/2010   Method used:   Electronically to        [facility] [phone]* (retail)       [address] [place], [place]  [address]       Ph: [phone]       Fax: [phone]   RxID:   [id number]

## 2011-01-21 NOTE — Progress Notes (Signed)
Summary: lab results  Phone Note Outgoing Call   Call placed by: Dessie Coma  LPN,  January 11, 2011 2:02 PM Call placed to: Patient Summary of Call: Spoke to patient and notified her per Dr. Kirke Corin, labs are fine.

## 2011-01-22 NOTE — Letter (Signed)
Summary: release of records  release of records   Imported By: Frazier Butt Chriscoe 07/06/2010 14:54:27  _____________________________________________________________________  External Attachment:    Type:   Image     Comment:   External Document

## 2011-01-29 ENCOUNTER — Telehealth: Payer: Self-pay | Admitting: Cardiovascular Disease

## 2011-02-01 ENCOUNTER — Encounter: Payer: Self-pay | Admitting: Cardiovascular Disease

## 2011-02-01 ENCOUNTER — Ambulatory Visit (INDEPENDENT_AMBULATORY_CARE_PROVIDER_SITE_OTHER): Payer: Medicare Other | Admitting: Cardiovascular Disease

## 2011-02-01 DIAGNOSIS — R0989 Other specified symptoms and signs involving the circulatory and respiratory systems: Secondary | ICD-10-CM | POA: Insufficient documentation

## 2011-02-01 DIAGNOSIS — I10 Essential (primary) hypertension: Secondary | ICD-10-CM

## 2011-02-01 DIAGNOSIS — R000 Tachycardia, unspecified: Secondary | ICD-10-CM

## 2011-02-01 DIAGNOSIS — E785 Hyperlipidemia, unspecified: Secondary | ICD-10-CM

## 2011-02-02 ENCOUNTER — Encounter: Payer: Self-pay | Admitting: Cardiovascular Disease

## 2011-02-03 ENCOUNTER — Encounter: Payer: Self-pay | Admitting: Cardiovascular Disease

## 2011-02-03 ENCOUNTER — Encounter (INDEPENDENT_AMBULATORY_CARE_PROVIDER_SITE_OTHER): Payer: Medicare Other

## 2011-02-03 DIAGNOSIS — I4892 Unspecified atrial flutter: Secondary | ICD-10-CM

## 2011-02-04 ENCOUNTER — Encounter: Payer: Self-pay | Admitting: Cardiovascular Disease

## 2011-02-04 ENCOUNTER — Telehealth: Payer: Self-pay | Admitting: Cardiovascular Disease

## 2011-02-04 ENCOUNTER — Encounter (INDEPENDENT_AMBULATORY_CARE_PROVIDER_SITE_OTHER): Payer: Medicare Other

## 2011-02-04 DIAGNOSIS — I4892 Unspecified atrial flutter: Secondary | ICD-10-CM

## 2011-02-04 NOTE — Progress Notes (Addendum)
  Phone Note Call from [name] [name]   Caller: [name] [name] Summary of Call: HR running high 90-104 last night and today  BP 153/92, 123/80, 126/80.  Unsure what to do about elevated HR.    Can take 2 tablets of Carvedilol instead of 1 if needed.   Appended Document:  [name] [name] notified per Dr. Kirke Corin, when HR is high, she can take 2 tablets of Carvedilol instead of one.  [name] [name] also states she will consider going back on Coumadin if Dr. Kirke Corin wants her to.  Uses [facility] in [place].

## 2011-02-09 ENCOUNTER — Ambulatory Visit (INDEPENDENT_AMBULATORY_CARE_PROVIDER_SITE_OTHER): Payer: Medicare Other | Admitting: Cardiovascular Disease

## 2011-02-09 ENCOUNTER — Encounter: Payer: Self-pay | Admitting: Cardiovascular Disease

## 2011-02-09 DIAGNOSIS — I10 Essential (primary) hypertension: Secondary | ICD-10-CM

## 2011-02-09 DIAGNOSIS — I4892 Unspecified atrial flutter: Secondary | ICD-10-CM

## 2011-02-10 ENCOUNTER — Telehealth: Payer: Self-pay | Admitting: Cardiovascular Disease

## 2011-02-10 NOTE — Assessment & Plan Note (Signed)
Summary: EKG/ MES  Nurse Visit   Vital Signs:  Patient profile:   75 year old female Height:      64 inches Weight:      175 pounds BMI:     30.15 Pulse (ortho):   77 / minute BP sitting:   132 / 75  Vitals Entered By: Lanny Hurst RN (February 04, 2011 2:30 PM) CC: Pt feels she is back in NSR. Comments Pt in for EKG after taking incr dose of Amiodarone 400mg  two times a day for 2 days. Pt still in A-Flutter, HR is lower than previous. Pt is also concerned with her BP being higher when she wakes up then after AM BP meds it runs much lower (low 90s - 100s SBP) She takes Coreg 12.5 two times a day and is asking if possible she needs to take 6.25mg  in AM and 12.5mg  dose in PM along with her PM dose of Lisinopril 10mg . Notified pt that we will contact her with any med changes after Dr. Mariah Milling has assessed BP numbers and meds. Advised pt to continue Amiodarone 400mg  two times a day for total of 3 days, so tomorrow should be her last day, and then advised to decr dose to 200mg  bid for 1 week.  Pt will f/u with [place] on Monday regarding her rhythm. If pt feels she is NSR we can do EKG.  Since pt is still in A Flutter, gave pt samples of Pradaxa 150mg  to take two times a day. If pt still in A flutter will need to continue on long-term anticoagulation.   CC:  Pt feels she is back in NSR.[name]   Allergies: 1)  ! Iodine 2)  ! Codeine 3)  ! Coumadin Prescriptions: PRADAXA 150 MG CAPS (DABIGATRAN ETEXILATE MESYLATE) Take one tablet by mouth two times a day.  #12 x 0   Entered by:   Lanny Hurst RN   Authorized by:   Dossie Arbour MD   Signed by:   Lanny Hurst RN on 02/04/2011   Method used:   Samples Given   RxID:   [id number] AMIODARONE HCL 200 MG TABS (AMIODARONE HCL) 2 tablets two times a day for 3 days then 1 tablet two times a day x 1 week. Then 1 tablet once daily.  #50 x 6   Entered by:   Lanny Hurst RN   Authorized by:   Dossie Arbour MD   Signed by:   Lanny Hurst RN on 02/04/2011   Method  used:   Electronically to        [facility] [phone]* (retail)       [address] [place], [place]  [address]       Ph: [phone]       Fax: [phone]   RxID:   [id number]

## 2011-02-10 NOTE — Assessment & Plan Note (Signed)
Summary: HR elevated/AMD   Visit Type:  Follow-up Primary Provider:  [name]  CC:  Heart rate elevated with some chest and arm & shoulder tightness with feeling lightheaded.[name] [name]  History of Present Illness: 75 year old woman with a history of HTN, atrial fibrillation, cardioversion in May of 2009, recurrent atrial fibrillation with cardioversion in June 2010 with no further episodes of atrial fibrillation over the past year, cardiac catheterization in 2007 showing no significant coronary artery disease who presents for routine followup.  [name] [name] presents today for evaluation of her tachycardia. Most recently in January, her heart rate was in the 50s and high 40s and her Coreg was decreased to a lower dose. She has had some significant stress this past week with her grandson coming to stay. He was a stressful evening as he could not sleep. Since then, her heart rate has been elevated. She is taking Coreg 6 mg in the a.m., 12 mg in the p.m.[name] [name] She reports waking up with a heart rate of 114 this morning.  In the past, she has had problems with stress causing elevated blood pressure and needing extra doses of blood pressure medication. She has had problems with sleeping, snoring and possible sleep apnea.   on August 3, she developed a severe neck discomfort, severe headache, nausea vomiting times several episodes. Blood pressure was 170 systolic. She was adjusted by a chiropractor who do to her elevated blood pressure, she was referred to the emergency room. There she felt weak, had headache and felt dizzy. In the hospital, she had an MRI was essentially negative. Chest x-ray was normal. CT of the head did not show any acute process.  Transesophageal echo performed in 2009 shows mildly dilated left atrium, no significant mitral valve disease, normal systolic function  EKG shows sinus tachycardia with right bundle branch block, rate 108 beats per minute, nonspecific ST changes diffusely in 2, 3, aVF, V3  through V6  Current Medications (verified): 1)  Fish Oil  Oil (Fish Oil) .[name] [name].. 1 Tsp Once Daily 2)  Red Yeast Rice 600 Mg Caps (Red Yeast Rice Extract) .... Take 4 Tabs By Mouth Daily 3)  Aspir-Low 81 Mg Tbec (Aspirin) .[name] [name].. 1 Once Daily 4)  Omeprazole 20 Mg Tbec (Omeprazole) .... Take 1 Tablet By Mouth Once A Day 5)  Amiodarone Hcl 200 Mg Tabs (Amiodarone Hcl) .[name] [name].. 1 Once Daily 6)  Tramadol Hcl 50 Mg Tabs (Tramadol Hcl) .... As Needed For Pain 7)  Carvedilol 6.25 Mg Tabs (Carvedilol) .... One Tablet Every A.m. & Two in The P.m. 8)  Lisinopril 10 Mg Tabs (Lisinopril) .... Take One Tablet in The P.m.  Allergies (verified): 1)  ! Iodine 2)  ! Codeine 3)  ! Coumadin  Past History:  Past Medical History: Last updated: 07/08/2010 ATRIAL FIBRILLATION, PAROXYSMAL, HX OF (ICD-V12.50) CARDIOMYOPATHY (ICD-425.4) DYSPNEA (ICD-786.05) HEADACHE, CHRONIC (ICD-784.0) HYPERTENSION (ICD-401.9) Cardioversion x 2  Past Surgical History: Last updated: 07/08/2010 s/p cholecystectomy s/p appendectomy, hysterectomy h/o multiple orthopedic procedures. s/p bilateral mastectomy with breast implants.  Family History: Last updated: 03/05/2009 heart disease: mother cancer: father (stomach), 2 sisters (breast) maternal grandmother (breast)   Social History: Last updated: 03/05/2009 Patient never smoked.  pt is married with children. pt is retired. 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  Risk Factors: Smoking Status: never (03/05/2009)  Review of Systems  The patient denies fever, weight loss, weight gain, vision loss, decreased hearing, hoarseness, chest pain, syncope, dyspnea on exertion, peripheral edema, prolonged cough,  abdominal pain, incontinence, muscle weakness, depression, and enlarged lymph nodes.         anxious, tachycardia  Vital Signs:  Patient profile:   75 year old female Height:      64 inches Weight:      175 pounds BMI:     30.15 Pulse  rate:   107 / minute BP sitting:   122 / 96  (left arm) Cuff size:   regular  Vitals Entered By: Bishop Dublin, CMA (February 01, 2011 10:19 AM)  Physical Exam  General:  Well developed, well nourished, in no acute distress. Head:  normocephalic and atraumatic Neck:  Neck supple, no JVD. No masses, thyromegaly or abnormal cervical nodes. Lungs:  Clear bilaterally to auscultation and percussion. Heart:  Non-displaced PMI, chest non-tender; regular rate and rhythm, S1, S2 with I-II/VI SEM RSB, no rubs or gallops. Carotid upstroke normal, no bruit. Pedals normal pulses. No edema, no varicosities. Abdomen:  Bowel sounds positive; abdomen soft and non-tender without masses Msk:  Back normal, normal gait. Muscle strength and tone normal. Pulses:  pulses normal in all 4 extremities Extremities:  No clubbing or cyanosis. Neurologic:  Alert and oriented x 3. Skin:  Intact without lesions or rashes. Psych:  Normal affect.   Impression & Recommendations:  Problem # 1:  TACHYCARDIA (ICD-785) she is in sinus tachycardia today. By her measurements has been having elevated heart rates in the past several days. Uncertain if this is secondary to underlying anxiety as recently she was bradycardic. We have given her a prescription for Coreg 12.5 mg b.i.d. and have asked her to cut the dose back when her heart rate as low. She did take an additional 6 mg Corag for tachycardia. She will likely need to hold her lisinopril with a higher dose of carvedilol to avoid hypotension. We will check a TSH this week  Problem # 2:  ATRIAL FIBRILLATION, PAROXYSMAL, HX OF (ICD-V12.50) she denies having symptoms of atrial fibrillation. She is not eager to restart warfarin. We will continue to monitor her closely.  Problem # 3:  HYPERLIPIDEMIA-MIXED (ICD-272.4) she is interested in having her cholesterol reevaluated on red yeast rice. We will check this today.  Orders: T-Lipid Profile [phone]) T-Hepatic  Function [phone])  Problem # 4:  HYPERTENSION (ICD-401.9) Blood pressures reasonably well controlled on her current medication regimen. We have not made any changes apart from a higher dose Coreg as needed.  The following medications were removed from the medication list:    Hydralazine Hcl 25 Mg Tabs (Hydralazine hcl) .[name] [name]... Take one tablet by mouth three times a day as needed for for blood pressure greater than 150/90    Losartan Potassium 50 Mg Tabs (Losartan potassium) .[name] [name]... Take one tablet by mouth two times a day Her updated medication list for this problem includes:    Aspir-low 81 Mg Tbec (Aspirin) .[name] [name]... 1 once daily    Carvedilol 12.5 Mg Tabs (Carvedilol) .[name] [name]... Take one tablet by mouth twice a day    Lisinopril 10 Mg Tabs (Lisinopril) .[name] [name]... Take one tablet in the p.m.  Other Orders: T-T3, Free [phone]) T-T4, Free [phone]) T-TSH [phone])  Patient Instructions: 1)  Your physician recommends that you schedule a follow-up appointment in: 6 months 2)  TO BE DONE AT [facility]:  Your physician recommends that you return for a FASTING lipid profile: (Lipid/LFT/Thyroid Panel) 3)  Your physician has recommended you make the following change in your medication: INCREASE Carvedilol 12.5mg  two times a day. Prescriptions: CARVEDILOL 12.5  MG TABS (CARVEDILOL) Take one tablet by mouth twice a day  #60 x 6   Entered by:   Lanny Hurst RN   Authorized by:   Dossie Arbour MD   Signed by:   Lanny Hurst RN on 02/01/2011   Method used:   Electronically to        [facility] [phone]* (retail)       [address] [place], [place]  [address]       Ph: [phone]       Fax: [phone]   RxID:   [id number]

## 2011-02-10 NOTE — Assessment & Plan Note (Signed)
Summary: NURSE VISIT/SAB  Nurse Visit   Vital Signs:  Patient profile:   75 year old female Height:      64 inches Weight:      175 pounds BMI:     30.15 Pulse rate:   106 / minute Pulse rhythm:   irregular BP sitting:   122 / 90  Vitals Entered By: Lanny Hurst RN (February 03, 2011 11:09 AM) CC: Pounding in chest Comments Pt in for EKG, shows Atrial flutter. Pt is not currently on anticoagulant. She is taking Amiodarone 200mg , Carvedilol 12.5mg  two times a day and Lisinopril 10mg  in PM. Pt brought in her recent BP and HR numbers. Notified pt we will be in contact with her about medication changes.   Allergies: 1)  ! Iodine 2)  ! Codeine 3)  ! Coumadin  Appended Document: NURSE VISIT/SAB EKG shows atrial flutter with ventricular rate 80 beats per minute. I will recommend increasing her amiodarone to 400 mg b.i.d. for 3 days or until her rhythm converted back to normal sinus rhythm. She could come in for an EKG tomorrow if she feels her rhythm is back to normal. When her rhythm is a normal sinus, which go back to amiodarone 200 mg b.i.d. for one week, then 200 mg daily  Appended Document: NURSE VISIT/SAB Notified pt to incr Amiodarone to 400mg  two times a day for 3 days or until back in NSR. Pt states she can feel when she goes in and out of rhythm. Pt will take another dose of Amiodarone 200mg  now and will take 2 tabs tonight. If pt feels she is back in NSR she will contact our office and we will have her come in for EKG. Discussed the possibility of starting anticoagulation, pt states if she does have to start she cannot afford Pradaxa and would have to be put on Coumadin.

## 2011-02-10 NOTE — Progress Notes (Signed)
Summary: EKG  Phone Note Call from Patient   Caller: Patient Summary of Call: Pt called this AM stating she did take the incr dose of Amiodarone 400mg  two times a day yesterday and this AM and she believes she is now back in NSR. Pt will come in today for EKG. Initial call taken by: Lanny Hurst RN,  February 04, 2011 10:10 AM

## 2011-02-10 NOTE — Letter (Signed)
Summary: At Home BP Readings  At Home BP Readings   Imported By: Harlon Flor 02/04/2011 10:07:26  _____________________________________________________________________  External Attachment:    Type:   Image     Comment:   External Document  Appended Document: At Home BP Readings Pt states that her BP in AM's have been elevated. She is taking Lisinopril per our recommendation in PM only since she is taking Carvedilol 12.5 two times a day. Notified pt we would look at her numbers and let her know if any BP med changes needed to be made. /MES

## 2011-02-16 NOTE — Letter (Signed)
Summary: At Home BP Readings  At Home BP Readings   Imported By: Harlon Flor 02/08/2011 13:16:57  _____________________________________________________________________  External Attachment:    Type:   Image     Comment:   External Document  Appended Document: At Home BP Readings BP's prior to EKG done on 02/04/11, pt scheduled for f/u with Dr. Mariah Milling tomorrow 02/09/11 Preliminarily reviewed. Forwarded to MD desktop for review and signature /MES

## 2011-02-16 NOTE — Progress Notes (Signed)
Summary: Authorization for Med  Phone Note Call from Patient Call back at Home Phone [phone]   Caller: Patient Call For: RN Summary of Call: Patient left voicemail that she went to pharmacy to pick up Pradaxa and they said our office needs to call insurance company to get authorization.  Patient does not have any medication left, do we have any samples we could offer her to hold her? Initial call taken by: [place],  February 10, 2011 7:46 AM  Follow-up for Phone Call        samples given of pradaxa. Follow-up by: Bishop Dublin, CMA,  February 10, 2011 8:21 AM

## 2011-02-16 NOTE — Assessment & Plan Note (Signed)
Summary: A-fib/ MES   Visit Type:  Follow-up Primary Provider:  Dr. [name]  CC:  c/o episode racing HR last night. Denies chest pain and SOB.[name]  History of Present Illness: 75 year old woman with a history of HTN, atrial fibrillation, cardioversion in May of 2009, recurrent atrial fibrillation with cardioversion in June 2010 with no further episodes of atrial fibrillation over the past year, cardiac catheterization in 2007 showing no significant coronary artery disease who recently converted to atrial flutter around the 13th of this February, who presents for routine followup.  We tried higher dose amiodarone in an attempt to pharmacologically convert her though she has rate controlled atrial flutter over the past week. No significant symptoms of shortness of breath or edema. She has been taking pradaxa over the past week though there was a week or so without anticoagulation.   blood pressure has been well-controlled on Coreg 12.5 mg b.i.d.. Currently she takes amiodarone 200 mg b.i.d..   In the past, she has had problems with stress causing elevated blood pressure and needing extra doses of blood pressure medication. She has had problems with sleeping, snoring and possible sleep apnea.  Transesophageal echo performed in 2009 shows mildly dilated left atrium, no significant mitral valve disease, normal systolic function  EKG showsAtrial flutter with ventricular rate 77 beats per minute, right bundle branch block  Current Medications (verified): 1)  Fish Oil  Oil (Fish Oil) .[name].. 1 Tsp Once Daily 2)  Red Yeast Rice 600 Mg Caps (Red Yeast Rice Extract) .... Take 4 Tabs By Mouth Daily 3)  Aspir-Low 81 Mg Tbec (Aspirin) .[name].. 1 Once Daily 4)  Omeprazole 20 Mg Tbec (Omeprazole) .... Take 1 Tablet By Mouth Once A Day 5)  Amiodarone Hcl 200 Mg Tabs (Amiodarone Hcl) .[name].. 1 Tablet Two Times A Day 6)  Tramadol Hcl 50 Mg Tabs (Tramadol Hcl) .... As Needed For Pain 7)  Carvedilol 12.5 Mg Tabs  (Carvedilol) .... Take One Tablet By Mouth Twice A Day 8)  Lisinopril 10 Mg Tabs (Lisinopril) .... Take One Tablet in The P.m. 9)  Pradaxa 150 Mg Caps (Dabigatran Etexilate Mesylate) .... Take One Tablet By Mouth Two Times A Day.  Allergies (verified): 1)  ! Iodine 2)  ! Codeine 3)  ! Coumadin  Past History:  Past Medical History: Last updated: 07/08/2010 ATRIAL FIBRILLATION, PAROXYSMAL, HX OF (ICD-V12.50) CARDIOMYOPATHY (ICD-425.4) DYSPNEA (ICD-786.05) HEADACHE, CHRONIC (ICD-784.0) HYPERTENSION (ICD-401.9) Cardioversion x 2  Past Surgical History: Last updated: 07/08/2010 s/p cholecystectomy s/p appendectomy, hysterectomy h/o multiple orthopedic procedures. s/p bilateral mastectomy with breast implants.  Family History: Last updated: 03/05/2009 heart disease: mother cancer: father (stomach), 2 sisters (breast) maternal grandmother (breast)   Social History: Last updated: 03/05/2009 Patient never smoked.  pt is married with children. pt is retired. 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  Risk Factors: Smoking Status: never (03/05/2009)  Review of Systems  The patient denies fever, weight loss, weight gain, vision loss, decreased hearing, hoarseness, chest pain, syncope, dyspnea on exertion, peripheral edema, prolonged cough, abdominal pain, incontinence, muscle weakness, depression, and enlarged lymph nodes.    Vital Signs:  Patient profile:   75 year old female Height:      64 inches Weight:      174 pounds BMI:     29.97 Pulse rate:   77 / minute BP sitting:   110 / 71  (left arm) Cuff size:   regular  Vitals Entered By: Lysbeth Galas CMA (February 09, 2011 11:26 AM)  Physical Exam  General:  Well developed, well nourished, in no acute distress. Head:  normocephalic and atraumatic Neck:  Neck supple, no JVD. No masses, thyromegaly or abnormal cervical nodes. Lungs:  Clear bilaterally to auscultation and percussion. Heart:   Non-displaced PMI, chest non-tender; irregular rate and rhythm, S1, S2 with I-II/VI SEM RSB, no rubs or gallops. Carotid upstroke normal, no bruit. Pedals normal pulses. No edema, no varicosities. Abdomen:  Bowel sounds positive; abdomen soft and non-tender without masses Msk:  Back normal, normal gait. Muscle strength and tone normal. Pulses:  pulses normal in all 4 extremities Extremities:  No clubbing or cyanosis. Neurologic:  Alert and oriented x 3. Skin:  Intact without lesions or rashes. Psych:  Normal affect.   Impression & Recommendations:  Problem # 1:  ATRIAL FIBRILLATION, PAROXYSMAL, HX OF (ICD-V12.50) she is currently in atrial flutter with a rate controlled ventricular rate. We will decrease the amiodarone to 200 mg daily and continue the Coreg 12.5 mg b.i.d.. We will continue anticoagulation for one month and then consider cardioversion.  Orders: EKG w/ Interpretation (93000)  Problem # 2:  HYPERTENSION (ICD-401.9) Blood pressure has been relatively well controlled on her current medication regimen. No further changes.  Her updated medication list for this problem includes:    Aspir-low 81 Mg Tbec (Aspirin) .[name]... 1 once daily    Carvedilol 12.5 Mg Tabs (Carvedilol) .[name]... Take one tablet by mouth twice a day    Lisinopril 10 Mg Tabs (Lisinopril) .[name]... Take one tablet in the p.m.  Patient Instructions: 1)  Your physician recommends that you schedule a follow-up appointment in: We will call you with appt for Cardioversion in 4 weeks at [facility]. 2)  Your physician has recommended you make the following change in your medication: CONTINUE Pradaxa 150mg  two times a day. DECREASE Amiodarone back to 200mg  once daily. Prescriptions: AMIODARONE HCL 200 MG TABS (AMIODARONE HCL) 1 tablet two times a day  #30 x 6   Entered by:   Lanny Hurst RN   Authorized by:   Dossie Arbour MD   Signed by:   Lanny Hurst RN on 02/09/2011   Method used:   Electronically to        [facility] [phone]* (retail)       [address] [place], [place]  [address]       Ph: [phone]       Fax: [phone]   RxID:   [id number] PRADAXA 150 MG CAPS (DABIGATRAN ETEXILATE MESYLATE) Take one tablet by mouth two times a day.  #60 x 6   Entered by:   Lanny Hurst RN   Authorized by:   Dossie Arbour MD   Signed by:   Lanny Hurst RN on 02/09/2011   Method used:   Electronically to        [facility] [phone]* (retail)       [address] [place], [place]  [address]       Ph: [phone]       Fax: [phone]   RxID:   [id number]

## 2011-02-18 DIAGNOSIS — I4892 Unspecified atrial flutter: Secondary | ICD-10-CM

## 2011-02-18 HISTORY — DX: Unspecified atrial flutter: I48.92

## 2011-02-19 ENCOUNTER — Telehealth: Payer: Self-pay | Admitting: Cardiovascular Disease

## 2011-02-24 ENCOUNTER — Encounter: Payer: Self-pay | Admitting: Cardiovascular Disease

## 2011-02-25 NOTE — Progress Notes (Addendum)
Summary: Aflutter-DCCV  Phone Note Call from Patient   Caller: Patient Summary of Call: Pt called today stating that she is taking Amiodarone 200mg  once daily now and has noticed her HR has incr slightly from 60s 70s to mid-high 80s. Pt c/o only mild discomfort in chest. She feels she is still in a flutter, feeling the irregularity in her chest. Pt states she does not prefer a DCCV but will do it if its the best option. She would rather have DCCV than to continue to stay in a fib/flutter and/or start Digoxin.  Pt will be on Pradaxa for 4 weeks as of 02/27/11. Do you wan me to schedule DCCV for the week of 03/01/11? And if so, do you want her to come in 1 day prior for EKG to confirm arrhythmia? Please advise. Initial call taken by: Lanny Hurst RN,  February 19, 2011 4:44 PM  Follow-up for Phone Call        OK to schedule that week     Appended Document: Aflutter-DCCV Pt is scheduled for DCCV 03/03/11 @ [facility] and will come in office 03/02/11 for EKG to verify A flutter.

## 2011-02-27 ENCOUNTER — Encounter: Payer: Self-pay | Admitting: Cardiovascular Disease

## 2011-03-02 ENCOUNTER — Encounter: Payer: Self-pay | Admitting: Cardiovascular Disease

## 2011-03-02 ENCOUNTER — Other Ambulatory Visit (INDEPENDENT_AMBULATORY_CARE_PROVIDER_SITE_OTHER): Payer: Medicare Other

## 2011-03-02 DIAGNOSIS — Z01810 Encounter for preprocedural cardiovascular examination: Secondary | ICD-10-CM

## 2011-03-02 DIAGNOSIS — I4891 Unspecified atrial fibrillation: Secondary | ICD-10-CM

## 2011-03-02 DIAGNOSIS — Z8679 Personal history of other diseases of the circulatory system: Secondary | ICD-10-CM

## 2011-03-02 NOTE — Letter (Signed)
Summary: Cardioversion/TEE Catering manager at [facility] [address]   [place], [place] [address]   Phone: [phone]  Fax: [phone]    Cardioversion  02/24/2011 MRN: [id number]  ELEORA SUTHERLAND [address] [place], [place]  [address]  [place]  Dear Ms. Weimar, You are scheduled for a Cardioversion on 03/03/11 with Dr. Mariah Milling.   Please arrive at [facility] at 6:30 a.m. on the day of your procedure.  1)   DIET:  A)   Nothing to eat or drink after midnight except your medications with a sip of water.  2)   You will have labs done today in the office.  3)   MAKE SURE YOU TAKE YOUR PRADAXA.        4)  B)   YOU MAY TAKE ALL of your remaining medications with a small amount of water.    5)  Must have a responsible person to drive you home.  6)   Bring a current list of your medications and current insurance cards.   * Special Note:  Every effort is made to have your procedure done on time. Occasionally there are emergencies that present themselves at the hospital that may cause delays. Please be patient if a delay does occur.  * If you have any questions after you get home, please call the office at [phone].

## 2011-03-03 ENCOUNTER — Ambulatory Visit: Payer: Self-pay | Admitting: Cardiovascular Disease

## 2011-03-03 DIAGNOSIS — I4892 Unspecified atrial flutter: Secondary | ICD-10-CM

## 2011-03-05 ENCOUNTER — Telehealth: Payer: Self-pay | Admitting: Cardiovascular Disease

## 2011-03-09 ENCOUNTER — Telehealth: Payer: Self-pay | Admitting: *Deleted

## 2011-03-09 ENCOUNTER — Ambulatory Visit (INDEPENDENT_AMBULATORY_CARE_PROVIDER_SITE_OTHER): Payer: Medicare Other | Admitting: *Deleted

## 2011-03-09 ENCOUNTER — Encounter: Payer: Self-pay | Admitting: *Deleted

## 2011-03-09 ENCOUNTER — Encounter: Payer: Self-pay | Admitting: Internal Medicine

## 2011-03-09 VITALS — BP 100/62 | HR 45 | Ht 64.0 in | Wt 182.8 lb

## 2011-03-09 DIAGNOSIS — I4892 Unspecified atrial flutter: Secondary | ICD-10-CM

## 2011-03-09 NOTE — Progress Notes (Signed)
Pt in for EKG, she feels she has converted back to a-flutter. Pt had DCCV 03/03/11 that was successful. Pt in SR today with Bradycardia, HR 45 and pt symptomatic. She feels weak, has c/o dizziness recently. Reviewed medications with pt, no changes have been made since her cardioversion. Will talk with Dr. Mariah Milling to have him review EKG and see if medications need to be adjusted. Will call pt today with this information. Advised pt to take it easy today due to low HR and her symptoms.

## 2011-03-09 NOTE — Assessment & Plan Note (Signed)
Summary: EKG prior to DCCV  Nurse Visit   Vital Signs:  Patient profile:   75 year old female Height:      64 inches Weight:      174 pounds Pulse rate:   69 / minute BP sitting:   113 / 71  (left arm) Cuff size:   regular CC: Atrial Flutter Comments Pt in the office today for EKG to confirm that she is still in Atrial Flutter. Pt is scheduled for an elective cardioversion tomorrow @ [facility] with Dr. Mariah Milling. Pt has been on Pradaxa 150mg  two times a day for 4 weeks. Instructions were given today for this procedure. Labs drawn and will be faxed with orders to [facility]. Pt has no further complaints at this time.   Allergies: 1)  ! Iodine 2)  ! Codeine 3)  ! Coumadin

## 2011-03-09 NOTE — Telephone Encounter (Signed)
Spoke to Dr. Mariah Milling about pt's nurse visit today, and he reviewed EKG and meds. Spoke to pt and advised her to decrease Carvedilol to 6.25 bid and to continue to hold Lisinopril 10mg  until BP stable. BP today was 100/62 and HR was 45. Pt will monitor BP/HR and call with any changes. Notified pt that if these changes do not help, to give [place] her BP/HR numbers so Dr. Mariah Milling can assess what med changes need to be made. Also notified pt that she does not need to continue ASA per Dr. Mariah Milling. Pt will take it easy today and will monitor HR and will decr PM dose of Carvedilol.

## 2011-03-09 NOTE — Patient Instructions (Signed)
We will call you with Dr. Windell Hummingbird recommendation after he reviews EKG. Do not do anything strenuous today due to low heart rate.

## 2011-03-09 NOTE — Letter (Signed)
Summary: At Home BP Readings  At Home BP Readings   Imported By: Harlon Flor 03/03/2011 08:25:11  _____________________________________________________________________  External Attachment:    Type:   Image     Comment:   External Document  Appended Document: At Home BP Readings Pt's BP readings, this was prior to her cardioversion. Preliminarily reviewed. Forwarded to MD desktop for review and signature /MES

## 2011-03-09 NOTE — Progress Notes (Signed)
Summary: Coreg  Phone Note Call from Patient   Caller: Patient Summary of Call: The patient had cardioversion and was told to start back on all medications. She never started the carvedilol because she thought BP & heart  rate running low; BP was 114/64 and pulse 54.  She is concerned because BP is running 177/104 and heart rate is 63.  Now heart rate is 53 with BP of 143/77.  She also not sure if she is out of rhythm.  Please call [phone]. Initial call taken by: Bishop Dublin, CMA,  March 05, 2011 8:40 AM  Follow-up for Phone Call        I spoke to pt, she states that she has continued to take Carvedilol 12.5mg  two times a day, and at times her BP has ran low but right now it is 126/77. Pt was previously taking Carvedilol 6.25mg  in AM and 12.5mg  in PM and she says this worked well for her before. Advised pt to monitor BP and take prior to taking BP med, if SBP <110 to not take carvedilol. Advised pt that if continues to be a problem, she can go back to her previous dose 6.25 in AM and 12.5 in PM and would let Dr. Mariah Milling know. Pt ok with this and she ahs f/u with Dr. Mariah Milling 03/07/11 for post DCCV. Follow-up by: Lanny Hurst RN,  March 05, 2011 1:25 PM

## 2011-03-09 NOTE — Telephone Encounter (Signed)
Chart opened in error

## 2011-03-10 ENCOUNTER — Telehealth: Payer: Self-pay

## 2011-03-10 NOTE — Telephone Encounter (Signed)
Called and spoke with pt, she states her HR has been running 47-50, and her symptoms have not improved after decreasing carvedilol to 6.25mg  bid. Pt is very fatigue and c/o DOE, she is an "outdoor person" and states this is not normal for her and is affecting ADL's. Instructed pt to STOP Carvedilol per Dr. Mariah Milling, and to continue Amiodarone 200mg  qd and Lisinopril 10mg  qd. Instructed her to take an extra Lisinopril prn if BP remains elevated. Informed pt that with her hx of paroxysmal a-fib that Dr. Mariah Milling is hesitant to d/c Amiodarone, but if HR remains low, he may cut dose in half. Pt is ok with this. She will call our office by the end of week to update on status.

## 2011-03-10 NOTE — Telephone Encounter (Signed)
Patient is not doing well.  She can't function, sleeps all the time, fatigue, shortness of breath and dizzy when gets up. " She is taking carvedilol and something needs to be done."  The patient's husband said she can't continue on like this.

## 2011-03-11 NOTE — Progress Notes (Signed)
Can we call to see how she is doing off coreg? thx

## 2011-03-12 ENCOUNTER — Telehealth: Payer: Self-pay | Admitting: *Deleted

## 2011-03-12 NOTE — Progress Notes (Signed)
See phone note

## 2011-03-12 NOTE — Telephone Encounter (Signed)
Would continue lisinopril 20 mg daily. If it continues to run high, would start HCTZ 25 mg daily in AM

## 2011-03-12 NOTE — Telephone Encounter (Signed)
Pt called to update [place] on how she was doing after stopping carvedilol. Pt states her HR has improved some, the lowest it has been is 47 and highest is 59. She states she is not as fatigued, but still not back to normal. As far as her BP, she was starting back on her Lisinopril 10mg  qhs, and her BP was staying elevated at 162/87 in am and she took an extra lisinopril 10mg  it did decr to 151/75 but in PM back up to 162/77. Last night she started Lisinopril 20mg  as instructed to do. Advised her to continue taking Lisinopril 20mg  qhs and to monitor BP over the weekend and call us Monday to see if this dose/med is going to work for her.

## 2011-03-17 ENCOUNTER — Ambulatory Visit: Payer: Medicare Other | Admitting: Cardiovascular Disease

## 2011-03-17 ENCOUNTER — Ambulatory Visit (INDEPENDENT_AMBULATORY_CARE_PROVIDER_SITE_OTHER): Payer: Medicare Other | Admitting: Cardiovascular Disease

## 2011-03-17 ENCOUNTER — Encounter: Payer: Self-pay | Admitting: Cardiovascular Disease

## 2011-03-17 DIAGNOSIS — R0602 Shortness of breath: Secondary | ICD-10-CM

## 2011-03-17 DIAGNOSIS — I10 Essential (primary) hypertension: Secondary | ICD-10-CM

## 2011-03-17 DIAGNOSIS — Z8679 Personal history of other diseases of the circulatory system: Secondary | ICD-10-CM

## 2011-03-17 DIAGNOSIS — E785 Hyperlipidemia, unspecified: Secondary | ICD-10-CM

## 2011-03-17 MED ORDER — AMLODIPINE BESYLATE 5 MG PO TABS: 5.0000 mg | ORAL_TABLET | Freq: Every day | ORAL | Status: DC

## 2011-03-17 MED ORDER — AMIODARONE HCL 100 MG PO TABS: 100.0000 mg | ORAL_TABLET | Freq: Every day | ORAL | Status: DC

## 2011-03-17 NOTE — Assessment & Plan Note (Signed)
She did have shortness of breath in the setting of bradycardia. This has improved off Coreg.

## 2011-03-17 NOTE — Progress Notes (Signed)
   Patient ID: [name] [name], female    DOB: November 28, 1936, 75 y.o.   MRN: [id number]  HPI Comments: 75 year old woman with a history of HTN, atrial fibrillation, cardioversion in May of 2009, recurrent atrial fibrillation with cardioversion in June 2010, cardiac catheterization in 2007 showing no significant coronary artery disease who recently converted to atrial flutter around  February 01 2011, with cardioversion 03/03/2011, who presents for routine followup.  Since her cardioversion, her heart rate has been very slow. We have held her coronary for over 10 days. She reports her heart rate continues to be any low 50s though improved from the 40s. She does have fatigue though this is better off Coreg. Her blood pressure continues to be elevated in the morning with frequent systolic pressures in the 140s to 160 range. Otherwise she is in good spirits.  In the past, she has had problems with stress causing elevated blood pressure and needing extra doses of blood pressure medication. She has had problems with sleeping, snoring and possible sleep apnea.  Transesophageal echo performed in 2009 shows mildly dilated left atrium, no significant mitral valve disease, normal systolic function  EKG shows sinus bradycardia with rate 48 beats per minute, right bundle branch block, T-wave abnormality in leads V3 to V6, II, III, aVF    Review of Systems  Constitutional: Negative.   HENT: Negative.   Eyes: Negative.   Respiratory: Negative.   Cardiovascular: Negative.   Gastrointestinal: Negative.   Musculoskeletal: Negative.   Skin: Negative.   Neurological: Negative.   Hematological: Negative.   Psychiatric/Behavioral: Negative.   All other systems reviewed and are negative.   BP 122/64  Pulse 48  Ht 5\' 4"  (1.626 m)  Wt 172 lb (78.019 kg)  BMI 29.52 kg/m2   Physical Exam  Nursing note and vitals reviewed. Constitutional: She is oriented to person, place, and time. She appears well-developed and  well-nourished.  HENT:  Head: Normocephalic.  Nose: Nose normal.  Mouth/Throat: Oropharynx is clear and moist.  Eyes: Conjunctivae are normal. Pupils are equal, round, and reactive to light.  Neck: Normal range of motion. Neck supple. No JVD present.  Cardiovascular: Normal rate, regular rhythm, normal heart sounds and intact distal pulses.  Exam reveals no gallop and no friction rub.   No murmur heard. Pulmonary/Chest: Effort normal and breath sounds normal. No respiratory distress. She has no wheezes. She has no rales. She exhibits no tenderness.  Abdominal: Soft. Bowel sounds are normal. She exhibits no distension. There is no tenderness.  Musculoskeletal: Normal range of motion. She exhibits no edema and no tenderness.  Lymphadenopathy:    She has no cervical adenopathy.  Neurological: She is alert and oriented to person, place, and time. Coordination normal.  Skin: Skin is warm and dry. No rash noted. No erythema.  Psychiatric: She has a normal mood and affect. Her behavior is normal. Judgment and thought content normal.    Assessment and Plan

## 2011-03-17 NOTE — Patient Instructions (Addendum)
Decrease your amiodarone to 100 mg daily. Start amlodipine 5 mg daily. Monitor your blood pressure. Contact the office if your blood pressure is running low. If this happens, we might need to decrease your amlodipine to 2.5 mg daily.  Continue pradaxa until the samples run out. Your physician recommends that you schedule a follow-up appointment in: 6 months

## 2011-03-17 NOTE — Assessment & Plan Note (Signed)
Blood pressure continues to be elevated in the morning. We will start amlodipine 5 mg daily, taken at nighttime. Continue lisinopril 20 mg daily.

## 2011-03-17 NOTE — Assessment & Plan Note (Signed)
We will decrease her amiodarone to 100 mg daily and have her monitor her heart rate. She is maintaining sinus rhythm. Hold off on Coreg at this time given her bradycardia.

## 2011-03-17 NOTE — Assessment & Plan Note (Signed)
She is taking red yeast rice. She is unable to tolerate a statin because of myalgia.

## 2011-03-22 ENCOUNTER — Other Ambulatory Visit: Payer: Self-pay | Admitting: Emergency Medicine

## 2011-03-22 MED ORDER — LISINOPRIL 20 MG PO TABS: 20.0000 mg | ORAL_TABLET | Freq: Every day | ORAL | Status: DC

## 2011-03-22 NOTE — Telephone Encounter (Signed)
rx sent to [facility] pharmacy

## 2011-03-30 ENCOUNTER — Telehealth: Payer: Self-pay

## 2011-03-30 NOTE — Telephone Encounter (Signed)
It is ok of she takes amiodarone 200 daily if heart rate is ok

## 2011-03-30 NOTE — Telephone Encounter (Signed)
Wants to make sure she understood Dr. Mariah Milling correctly that when she takes the last pill of Pradaxa does not need anymore.  The increased dose of  lisinopril 20mg  for increased blood pressure has been helping the BP 131/75.  She is now taking 200mg  daily; her heart rate is staying in the 60's.  [phone].

## 2011-03-30 NOTE — Telephone Encounter (Signed)
Pt notified that she can stop Pradaxa after completed samples per last office note. She is to continue Lisinopril 20mg  and Amlodipine 5mg  (pt states her BP/HR are doing great on this dose, however, if BP low we can decr amlod to 2.5) Pt was recently told to decr Amiodarone to 100mg  but this was due to her low HR (in 40s). Pt's HR has been stable in 60s and she has been taking Amiodarone 200 for past few days, told pt she could continue the 200 as long as HR stable.

## 2011-03-31 NOTE — Telephone Encounter (Signed)
Patient notified

## 2011-04-13 ENCOUNTER — Telehealth: Payer: Self-pay | Admitting: Cardiovascular Disease

## 2011-04-13 MED ORDER — AMIODARONE HCL 200 MG PO TABS: 200.0000 mg | ORAL_TABLET | Freq: Every day | ORAL | Status: DC

## 2011-04-13 NOTE — Telephone Encounter (Signed)
Patient would like to have amiodarone 200 mg one tablet daily sent to [facility] pharmacy.  Per Dr. Mariah Milling okay to change the amiodarone dose to 200 mg one tablet daily.

## 2011-04-13 NOTE — Telephone Encounter (Signed)
Pt has questions about Amioderone.

## 2011-04-13 NOTE — Telephone Encounter (Signed)
Ok to change to amio 200. This is what we did on her last visit to the clinic

## 2011-04-13 NOTE — Telephone Encounter (Signed)
LMOMTCB

## 2011-05-04 NOTE — Assessment & Plan Note (Signed)
[facility]                        [place] CARDIOLOGY OFFICE NOTE   LATIESHA, HARADA                      MRN:          [id number]  DATE:12/31/2010                            DOB:          07-May-1936    Ms. Pridgen is a 75 year old female who is here today for a followup  visit.  She used to be my previous patient in [place] and currently  she follows up with Dr. Mariah Milling.  She is here due to concerns about her  labile blood pressure.  She has the following problem list:  1. Atrial fibrillation status post cardioversion twice in the past.  2. 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  Ejection fraction normalized completely      after conversion to sinus rhythm.  3. Labile hypertension.  4. Hyperlipidemia.  5. No history of coronary artery disease per cardiac catheterization.   INTERVAL HISTORY:  Ms. Freshour has been having problems with her  hypertension.  She noticed lately symptoms of fatigue associated with  increased dyspnea as well as dizziness and lightheadedness.  This  happens mostly actually at night after she takes her p.m. dose of  medications.  She thought it was due to lisinopril and stopped taking  it.  She was tried on hydralazine three times daily, but that made her  sick in her stomach and thus she stopped taking the medication.  She has  not been having any chest pain or palpitations.  She is actually  currently not taking lisinopril.   MEDICATIONS:  1. Fish oil once daily.  2. Red yeast rice.  3. Aspirin 81 mg once daily.  4. Prilosec 20 mg once daily.  5. Amiodarone 200 mg once daily.  6. Carvedilol 6.25 mg 2 tablets at night and one in the morning.   PHYSICAL EXAMINATION:  VITAL SIGNS:  Weight is 173 pounds, blood  pressure is 116/68, pulse is 48, oxygen saturation is 95% on room air.  NECK:   Reveals no JVD or carotid bruits.  LUNGS:  Clear to auscultation.  HEART:  Regular rate and rhythm with no gallops or murmurs.  ABDOMEN:  Benign, nontender, nondistended.  EXTREMITIES:  With no clubbing, cyanosis, or edema.   Electrocardiogram was done which showed sinus bradycardia with a heart  rate of 74, right bundle-branch block, and T-wave changes in the  inferolateral leads.   IMPRESSION:  1. Labile hypertension with intolerance to multiple medications.  I am      actually concerned about her bradycardia which might be      contributing to symptoms of lightheadedness and not feeling well.      Her symptoms are happening mostly at night and that is after she      takes the higher dose of Coreg at night which is 12.5 mg which      might make her heart rate even slower in the night hours.  Thus, I      will go ahead  and increase carvedilol to 3.125 mg twice daily.  I      will start her on losartan 50 mg once daily, Given the benefit from      angiotensin-converting enzyme inhibitors or angiotensin receptor      blockers in the setting of atrial fibrillation.  If she does not      tolerate losartan well then the next step will be amlodipine 5 mg      once daily as recommended before.  She will keep checking her blood      pressure, will notify us with the readings.  2. Atrial fibrillation:  Currently maintained in sinus rhythm with      amiodarone.  She stopped taking Coumadin last year actually per her      choice.  I had a discussion with her about the long-term risk of      thromboembolic complications related to atrial fibrillation.      Although, her CHADs score is only 1 due to her history of      hypertension, her [place] VAS score is 3 due to her age, female      status, and hypertension.  Thus, she is at the intermediate risk      for thromboembolic complications.  In this situation, the benefit      of anticoagulation outweigh the risks and this was discussed with      her  today.  She is going to think about it.  I also gave her the      other option which is Pradexa.  The patient will follow up with [place]      in few months or with Dr. Mariah Milling.     Lorine Bears, MD  Electronically Signed    MA/MedQ  DD: 12/31/2010  DT: 01/01/2011  Job #: [id number]

## 2011-05-07 NOTE — Discharge Summary (Signed)
NAME:  [name] [name], [name] [name] NO.:  [id number]   MEDICAL RECORD NO.:  [id number]          PATIENT TYPE:  INP   LOCATION:  2631                         FACILITY:  [facility]   PHYSICIAN:  Vesta Mixer, M.D. DATE OF BIRTH:  1936/06/06   DATE OF ADMISSION:  03/08/2006  DATE OF DISCHARGE:  03/09/2006                                 DISCHARGE SUMMARY   DISCHARGE DIAGNOSES:  1.  Chest pain-noncardiac.  2.  Transient hypoxemia.  3.  Hypercholesterolemia.  4.  Intermittent atrial fibrillation.   DISCHARGE MEDICATIONS:  1.  Fish oil capsules once a day.  2.  Aspirin 81 milligrams a day.  3.  Red yeast rice once a day.  4.  Cardizem CD 180 milligrams a day.  5.  Triflex over the counter once a day.   She is to see Dr. Elease Hashimoto in one to two weeks. She is to see Dr. Shelle Iron at  his office in one to two weeks. She is to see Dr. [name] as needed.   HISTORY:  [name] is a 75 year old female who was admitted from the office  yesterday with an episode of chest pain and abnormal EKG. Please see  dictated H&P for further details.   HOSPITAL COURSE:  Problem 1: CHEST PAIN: The patient ruled out for  myocardial infarction with serial CPKs. Because of her abnormal EKG, she  underwent right and left heart catheterization. The right heart  catheterization was done because she was hypoxic with her first arterial  blood sample.   She was found to have relatively smooth and normal coronary arteries. She  had normal left ventricular systolic function. She had normal right heart  pressures with a normal cardiac output.   She did fairly well. Over the day, her pO2 gradually came up. I doubt that  she has had a pulmonary embolus. She has no evidence of DVT. We will  discharge her to home and she will see Dr. Elease Hashimoto and Dr. Shelle Iron as an  outpatient. Other medical problems relatively stable.           ______________________________  Vesta Mixer, M.D.     PJN/MEDQ  D:  03/09/2006   T:  03/10/2006  Job:  [id number]   cc:   Marcelyn Bruins, M.D. [facility]  [address]  [place]  [place] [address]   Thora Lance, M.D.  Fax: [phone]

## 2011-05-07 NOTE — Cardiovascular Report (Signed)
NAME:  [name] [name], [name] [name] NO.:  [id number]   MEDICAL RECORD NO.:  [id number]          PATIENT TYPE:  INP   LOCATION:  2631                         FACILITY:  [facility]   PHYSICIAN:  Vesta Mixer, M.D. DATE OF BIRTH:  06-23-1936   DATE OF PROCEDURE:  03/09/2006  DATE OF DISCHARGE:  03/09/2006                              CARDIAC CATHETERIZATION   HISTORY:  Ms. Amacher is a middle-aged female who was admitted with chest pain  yesterday with episodes of chest discomfort and EKG changes. She is referred  for heart catheterization for further evaluation.   PROCEDURE:  Right and left heart catheterization.   The right femoral artery and the right femoral vein were easily cannulated  using the modified Seldinger technique. We decided to do right heart  catheterization when her initial arterial sample was quite dark and she was  found to have a pO2 on 59%. We performed right heart cath to ensure that she  did not have significant right heart problems.   HEMODYNAMICS:  RA pressure is 5. The right ventricular pressures 21/1.  Pulmonary capillary wedge pressure is 5. Pulmonary artery pressure 17/11.  Cardiac outputs is 4.5 with an index of 2.5.   ANGIOGRAPHY:  Left main: Left main is smooth and normal.   Left anterior descending artery is smooth and normal. There is a large  diagonal artery which is normal.   Left circumflex artery had a large and normal vessel.   Right coronary artery is large and normal. Posterior descending artery is  normal.   Left ventriculogram was performed in the 30 RAO position. It reveals normal  left ventricular systolic function and there is no mitral regurgitation.   COMPLICATIONS:  None.   CONCLUSION:  1.  Smooth and normal coronary arteries.  2.  Normal left ventricular systolic function with an ejection fraction      around 65%.  3.  The patient does have hypoxemia. This may have been due to some Versed      that she received for  relaxation. We will check O2 saturation at the end      of the case when she is a little bit more awake. If she continues to be      hypoxic, then we will proceed with a pulmonary workup.           ______________________________  Vesta Mixer, M.D.     PJN/MEDQ  D:  03/09/2006  T:  03/10/2006  Job:  [id number]

## 2011-05-07 NOTE — H&P (Signed)
NAME:  [name], [name] NO.:  [id number]   MEDICAL RECORD NO.:  [id number]          PATIENT TYPE:  INP   LOCATION:  2631                         FACILITY:  [facility]   PHYSICIAN:  Vesta Mixer, M.D. DATE OF BIRTH:  1936-05-13   DATE OF ADMISSION:  03/08/2006  DATE OF DISCHARGE:                                HISTORY & PHYSICAL   HISTORY OF PRESENT ILLNESS:  [name] [name] is a middle-aged female with a  history of intermittent atrial fibrillation and some intermittent episodes  of chest pain.  She is admitted today for episodes of chest pain and  discomfort.   [name] has had some intermittent episodes of chest pain for the past week  or so.  She was in the gastroenterologist today and describing her symptoms.  They became alarmed and told her that she should see [place].   For the past several days, she has had a dull achiness in her chest.  It  radiates through to her back.  She also feels a lump in her throat when she  swallows.  Pain seems to radiate to her left arm.  It has caused her to have  a little bit of shortness of breath.  She denies any syncope, pre-syncope,  or diaphoresis.   CURRENT MEDICATIONS:  1.  Fish oil capsules 1 g daily.  2.  Aspirin 81 mg daily.  3.  Red yeast rice two tablets daily.  4.  Cardizem CD 180 mg daily.  5.  Tri-Flex over-the-counter once daily.   ALLERGIES:  1.  IODINE which causes syncope.  2.  CODEINE which causes a headache.   PAST MEDICAL HISTORY:  1.  History of intermittent atrial fibrillation.  2.  History of sinus problems.  3.  History of gastroesophageal reflux.   SOCIAL HISTORY:  The patient is a nonsmoker.  She does not drink alcohol.   FAMILY HISTORY:  Noncontributory.   REVIEW OF SYSTEMS:  As listed in the HPI.  Is otherwise negative.  She has  no problems with her eyes, ears, nose.  She does have some pain and some  discomfort with swallowing.  She denies any heat or cold intolerance, weight  gain or  weight loss.  She denies any GU problems.   PHYSICAL EXAMINATION:  GENERAL:  She is a middle-aged female in no acute  distress.  She is alert and oriented x3, and her mood and affect are normal.  VITAL SIGNS:  Weight is 167.  Blood pressure is 124/74, with a heart rate of  62.  HEENT:  2+ carotids.  She has no bruits.  No JVD.  No thyromegaly.  LUNGS:  Clear to auscultation.  HEART:  Regular rate, S1 and S2.  She has no murmurs.  ABDOMEN:  Good bowel sounds and is nontender.  EXTREMITIES:  She has no cyanosis, clubbing, or edema.  NEUROLOGIC:  Nonfocal.   LABORATORY DATA:  Her EKG reveals normal sinus rhythm.  She has T-wave  inversions in the inferior leads as well as the lateral leads.  These were  present very slightly during her EKG  a year ago, but they are certainly more  pronounced now.  Her laboratory data is pending.   [name] [name] presents with some episodes of chest pain that are fairly  worrisome for coronary artery disease.  In addition, she has EKG changes  which are consistent with ischemic changes.  I would like to admit her to  the hospital and start her on Lovenox and nitroglycerin drip.  We will  anticipate doing a heart catheterization tomorrow.   We will pre-medicate her because of allergy to iodine.  She will receive  some steroids tonight, as well as some Benadryl, as well as Benadryl,  steroids, and Pepcid tomorrow.           ______________________________  Vesta Mixer, M.D.     PJN/MEDQ  D:  03/08/2006  T:  03/08/2006  Job:  [id number]

## 2011-05-24 ENCOUNTER — Telehealth: Payer: Self-pay | Admitting: Cardiovascular Disease

## 2011-05-24 NOTE — Telephone Encounter (Signed)
Pt c/o pain in her shoulder blades since yesterday.  Not sure if this is indigestion.

## 2011-05-26 NOTE — Telephone Encounter (Signed)
Attempted to contact pt, LMOM TCB.  

## 2011-06-21 LAB — HM COLONOSCOPY

## 2011-06-21 LAB — HM MAMMOGRAPHY

## 2011-08-09 ENCOUNTER — Encounter: Payer: Self-pay | Admitting: Cardiovascular Disease

## 2011-08-09 ENCOUNTER — Ambulatory Visit (INDEPENDENT_AMBULATORY_CARE_PROVIDER_SITE_OTHER): Payer: Medicare Other | Admitting: Cardiovascular Disease

## 2011-08-09 DIAGNOSIS — E785 Hyperlipidemia, unspecified: Secondary | ICD-10-CM

## 2011-08-09 DIAGNOSIS — Z8679 Personal history of other diseases of the circulatory system: Secondary | ICD-10-CM

## 2011-08-09 DIAGNOSIS — R0602 Shortness of breath: Secondary | ICD-10-CM

## 2011-08-09 DIAGNOSIS — I10 Essential (primary) hypertension: Secondary | ICD-10-CM

## 2011-08-09 DIAGNOSIS — R0989 Other specified symptoms and signs involving the circulatory and respiratory systems: Secondary | ICD-10-CM

## 2011-08-09 DIAGNOSIS — I428 Other cardiomyopathies: Secondary | ICD-10-CM

## 2011-08-09 NOTE — Patient Instructions (Signed)
You are doing well. No medication changes were made. Please call us if you have new issues that need to be addressed before your next appt.  We will call you for a follow up Appt. In 12 months  

## 2011-08-09 NOTE — Assessment & Plan Note (Signed)
She does report occasional shortness of breath. Symptoms are somewhat atypical and model. Symptoms occur at rest, occasionally with exertion. Chest auscultation is normal. We have suggested if symptoms get worse, we could perform a chest x-ray, otherwise no workup is needed. No previous smoking history.

## 2011-08-09 NOTE — Assessment & Plan Note (Signed)
Cholesterol is at goal on the current lipid regimen. No changes to the medications were made. Cardiac catheterization several years ago with no significant disease.

## 2011-08-09 NOTE — Assessment & Plan Note (Signed)
Holding normal sinus rhythm with no atrial fibrillation or flutter. Surveillance labs for amiodarone were normal in February of this year.

## 2011-08-09 NOTE — Assessment & Plan Note (Signed)
Blood pressure is well controlled on today's visit. No changes made to the medications. 

## 2011-08-09 NOTE — Progress Notes (Signed)
Patient ID: [name] [name], female    DOB: 06/06/1936, 75 y.o.   MRN: [id number]  HPI Comments: 75 year old woman with a history of HTN, atrial fibrillation, cardioversion in May of 2009, recurrent atrial fibrillation with cardioversion in June 2010, cardiac catheterization in 2007 showing no significant coronary artery disease who recently converted to atrial flutter around  February 01 2011, with cardioversion 03/03/2011, who presents for routine followup.  Overall, she reports that she is doing very well. She is recovering from [facility] spotted fever several months ago in June. She had a tick bite on her left leg. Since then, she has felt fatigue. She has completed a course of doxycycline. Also with some new knee discomfort requiring cortisone shots. She is complaining of mild shortness of breath, typically at rest. No significant cough. No lower extremity edema. She does have dizziness from a blockage in her ear.  In the past, she has had problems with stress causing elevated blood pressure and needing extra doses of blood pressure medication. She has had problems with sleeping, snoring and possible sleep apnea.  Transesophageal echo performed in 2009 shows mildly dilated left atrium, no significant mitral valve disease, normal systolic function  EKG shows sinus bradycardia with rate 57 beats per minute, incomplete right bundle branch block, T-wave abnormality in leads  II, III, aVF, V3 and V4    Outpatient Encounter Prescriptions as of 08/09/2011  Medication Sig Dispense Refill  . amiodarone (PACERONE) 200 MG tablet Take 1 tablet (200 mg total) by mouth daily.  30 tablet  6  . amLODipine (NORVASC) 5 MG tablet Take 1 tablet (5 mg total) by mouth daily.  30 tablet  11  . aspirin 81 MG tablet Take 81 mg by mouth daily.        . Fish Oil OIL Take 5 mLs by mouth daily.        [name] lisinopril (PRINIVIL,ZESTRIL) 20 MG tablet Take 10 mg by mouth at bedtime.        [name] omeprazole (PRILOSEC) 20 MG  capsule Take 20 mg by mouth daily.        . Red Yeast Rice 600 MG CAPS Take 1 capsule by mouth daily.        . traMADol (ULTRAM) 50 MG tablet Take 50 mg by mouth as needed. For pain          Review of Systems  Constitutional: Negative.   HENT: Negative.   Eyes: Negative.   Respiratory: Positive for shortness of breath.   Cardiovascular: Negative.   Gastrointestinal: Negative.   Musculoskeletal: Negative.   Skin: Negative.   Neurological: Positive for dizziness.  Hematological: Negative.   Psychiatric/Behavioral: Negative.   All other systems reviewed and are negative.    BP 134/76  Pulse 57  Ht 5\' 4"  (1.626 m)  Wt 168 lb (76.204 kg)  BMI 28.84 kg/m2  Physical Exam  Nursing note and vitals reviewed. Constitutional: She is oriented to person, place, and time. She appears well-developed and well-nourished.  HENT:  Head: Normocephalic.  Nose: Nose normal.  Mouth/Throat: Oropharynx is clear and moist.  Eyes: Conjunctivae are normal. Pupils are equal, round, and reactive to light.  Neck: Normal range of motion. Neck supple. No JVD present.  Cardiovascular: Normal rate, regular rhythm, S1 normal, S2 normal, normal heart sounds and intact distal pulses.  Exam reveals no gallop and no friction rub.   No murmur heard. Pulmonary/Chest: Effort normal and breath sounds normal. No respiratory distress. She has no  wheezes. She has no rales. She exhibits no tenderness.  Abdominal: Soft. Bowel sounds are normal. She exhibits no distension. There is no tenderness.  Musculoskeletal: Normal range of motion. She exhibits no edema and no tenderness.  Lymphadenopathy:    She has no cervical adenopathy.  Neurological: She is alert and oriented to person, place, and time. Coordination normal.  Skin: Skin is warm and dry. No rash noted. No erythema.  Psychiatric: She has a normal mood and affect. Her behavior is normal. Judgment and thought content normal.         Assessment and Plan

## 2011-09-02 ENCOUNTER — Encounter: Payer: Self-pay | Admitting: Cardiovascular Disease

## 2011-09-28 ENCOUNTER — Telehealth: Payer: Self-pay | Admitting: *Deleted

## 2011-09-28 NOTE — Telephone Encounter (Signed)
Pt was started on Z-pak for acute bronchitis and sinusitis by pcp, she is asking, since common side effect of this abx can be arrhythmia for pt's with h/o arrhythimia- if she can take an extra amiodarone only if she has irreg HR while taking Z-pak. She currently takes Amiodarone 200mg  daily and has been doing great on this dose. Please advise. I told pt in meantime, if she has any HR irregularity to call the office.

## 2011-10-01 NOTE — Telephone Encounter (Signed)
LMOMTCB

## 2011-10-01 NOTE — Telephone Encounter (Signed)
Ok to take extra amio if needed for arrhythmia

## 2011-10-06 NOTE — Telephone Encounter (Signed)
LmOM TCB.

## 2011-10-06 NOTE — Telephone Encounter (Signed)
Pt already notified, she was only asking about amiodarone while on Z pack if she needed it. She has not been having any symptoms. Pt aware of msg below.

## 2011-12-20 ENCOUNTER — Other Ambulatory Visit: Payer: Self-pay

## 2011-12-20 MED ORDER — AMIODARONE HCL 200 MG PO TABS: 200.0000 mg | ORAL_TABLET | Freq: Every day | ORAL | Status: DC

## 2012-01-05 ENCOUNTER — Telehealth: Payer: Self-pay

## 2012-01-05 MED ORDER — LISINOPRIL 20 MG PO TABS: 20.0000 mg | ORAL_TABLET | Freq: Every day | ORAL | Status: DC

## 2012-01-05 NOTE — Telephone Encounter (Signed)
Refill sent to [facility] pharmacy for lisinopril

## 2012-02-02 ENCOUNTER — Other Ambulatory Visit: Payer: Self-pay

## 2012-02-02 ENCOUNTER — Telehealth: Payer: Self-pay | Admitting: Cardiovascular Disease

## 2012-02-02 ENCOUNTER — Observation Stay: Payer: Self-pay | Admitting: Internal Medicine

## 2012-02-02 LAB — BASIC METABOLIC PANEL
Anion Gap: 8 (ref 7–16)
BUN: 19 mg/dL — ABNORMAL HIGH (ref 7–18)
Calcium, Total: 8.6 mg/dL (ref 8.5–10.1)
Chloride: 105 mmol/L (ref 98–107)
Co2: 26 mmol/L (ref 21–32)
Creatinine: 1.42 mg/dL — ABNORMAL HIGH (ref 0.60–1.30)
EGFR (African American): 47 — ABNORMAL LOW
EGFR (Non-African Amer.): 38 — ABNORMAL LOW
Glucose: 120 mg/dL — ABNORMAL HIGH (ref 65–99)
Osmolality: 281 (ref 275–301)
Potassium: 4.7 mmol/L (ref 3.5–5.1)
Sodium: 139 mmol/L (ref 136–145)

## 2012-02-02 LAB — CBC
HCT: 37.2 % (ref 35.0–47.0)
HGB: 12.3 g/dL (ref 12.0–16.0)
MCH: 32.4 pg (ref 26.0–34.0)
MCHC: 33.1 g/dL (ref 32.0–36.0)
MCV: 98 fL (ref 80–100)
Platelet: 191 10*3/uL (ref 150–440)
RBC: 3.81 10*6/uL (ref 3.80–5.20)
RDW: 14 % (ref 11.5–14.5)
WBC: 5.7 10*3/uL (ref 3.6–11.0)

## 2012-02-02 LAB — TROPONIN I
Troponin-I: <0.02 ng/mL
Troponin-I: 0.03 ng/mL

## 2012-02-02 NOTE — Telephone Encounter (Signed)
Pt calling states that her BP is dropping during the day. She has been increasing her fluids with gatorade and ate more salt. This morning when 131/72 HR 57 at 9:00 am and 115/53 HR 51 just now and she is dizzy  Yesterday  2:30 am 147/68    7:30 am 110/69 HR 60,  8:00 am 94/57 HR  62      10:00 am 102/47 HR 49   5:00 pm 95/49 HR 50

## 2012-02-02 NOTE — Telephone Encounter (Signed)
N/A.  LMTC. 

## 2012-02-02 NOTE — Telephone Encounter (Signed)
Would hold amlodipine for hypotension, only take daily for SBP >120 If rate is slow, hold amiodarone today, take 100 mg for rate in the 50s, full 200 mg daily for rate in the 60s

## 2012-02-02 NOTE — Telephone Encounter (Signed)
Pt called back with a decreased heart rate of 42 and increased dizziness.  I recommended that she go to the ER or urgent care now.

## 2012-02-02 NOTE — Telephone Encounter (Signed)
BP is flucuating as described below.  Dizziness is off and on usually when BP is low.  She states she is only slightly dizzy.  She states she cut her lisinopril back to 10mg  the past two nights.  She is taking the regular doses of amiodarone (200mg  qd) and Norvasc (5mg  qhs).  Please advise.  Thanks.

## 2012-02-03 LAB — BASIC METABOLIC PANEL
Anion Gap: 10 (ref 7–16)
BUN: 17 mg/dL (ref 7–18)
Calcium, Total: 8.2 mg/dL — ABNORMAL LOW (ref 8.5–10.1)
Chloride: 108 mmol/L — ABNORMAL HIGH (ref 98–107)
Co2: 26 mmol/L (ref 21–32)
Creatinine: 1.08 mg/dL (ref 0.60–1.30)
EGFR (African American): >60
EGFR (Non-African Amer.): 53 — ABNORMAL LOW
Glucose: 106 mg/dL — ABNORMAL HIGH (ref 65–99)
Osmolality: 289 (ref 275–301)
Potassium: 4.3 mmol/L (ref 3.5–5.1)
Sodium: 144 mmol/L (ref 136–145)

## 2012-02-04 ENCOUNTER — Encounter: Payer: Self-pay | Admitting: Cardiovascular Disease

## 2012-02-04 NOTE — Telephone Encounter (Signed)
Patient went to ER and was admitted will be discharged 02/02/12 and follow up in the [place] office on Monday Feb. 18th

## 2012-02-07 ENCOUNTER — Encounter: Payer: Self-pay | Admitting: Cardiovascular Disease

## 2012-02-07 ENCOUNTER — Ambulatory Visit (INDEPENDENT_AMBULATORY_CARE_PROVIDER_SITE_OTHER): Payer: Medicare Other | Admitting: Cardiovascular Disease

## 2012-02-07 DIAGNOSIS — R0989 Other specified symptoms and signs involving the circulatory and respiratory systems: Secondary | ICD-10-CM

## 2012-02-07 DIAGNOSIS — Z8679 Personal history of other diseases of the circulatory system: Secondary | ICD-10-CM

## 2012-02-07 DIAGNOSIS — I10 Essential (primary) hypertension: Secondary | ICD-10-CM

## 2012-02-07 DIAGNOSIS — E785 Hyperlipidemia, unspecified: Secondary | ICD-10-CM

## 2012-02-07 NOTE — Progress Notes (Signed)
Patient ID: [name] [name], female    DOB: 27-May-1936, 76 y.o.   MRN: [id number]  HPI Comments: 76 year old woman with a history of HTN, atrial fibrillation, cardioversion in May of 2009, recurrent atrial fibrillation with cardioversion in June 2010, cardiac catheterization in 2007 showing no significant coronary artery disease who recently converted to atrial flutter around  February 01 2011, with cardioversion 03/03/2011, who presents for routine followup. H/o [facility] spotted fever  in June 2012.  She had a recent admission to the hospital for bradycardia and hypotension. At home she recorded heart rates in the 40s, blood pressure in the 90s. In the hospital, she was noted to be mildly dehydrated and was given IV fluids. She felt better with fluids. She also has stopped her lisinopril as she reports having many  side effects. Workup in the hospital was essentially negative. It was recommended that she cut her amiodarone in half to 100 mg daily. She was hesitant to do this given previous history of arrhythmia and has continued on amiodarone 200 mg daily.  In the past, she has had problems with stress causing elevated blood pressure and needing extra doses of blood pressure medication.  She has had problems with sleeping, snoring and possible sleep apnea.  Transesophageal echo performed in 2009 shows mildly dilated left atrium, no significant mitral valve disease, normal systolic function  EKG shows sinus bradycardia with rate 55 beats per minute, incomplete right bundle branch block, T-wave abnormality in leads  II, III, aVF, V3 and V5    Outpatient Encounter Prescriptions as of 02/07/2012  Medication Sig Dispense Refill  . amiodarone (PACERONE) 200 MG tablet Take 1 tablet (200 mg total) by mouth daily.  30 tablet  8  . amLODipine (NORVASC) 5 MG tablet Take 1 tablet (5 mg total) by mouth daily.  30 tablet  11  . aspirin 81 MG tablet Take 81 mg by mouth daily.        . Fish Oil OIL  Take 5 mLs by mouth daily.        [name] omeprazole (PRILOSEC) 20 MG capsule Take 20 mg by mouth daily.        . Red Yeast Rice 600 MG CAPS Take 1 capsule by mouth daily.        . traMADol (ULTRAM) 50 MG tablet Take 50 mg by mouth as needed. For pain         Review of Systems  Constitutional: Positive for fatigue.  HENT: Negative.   Eyes: Negative.   Cardiovascular: Negative.   Gastrointestinal: Negative.   Musculoskeletal: Negative.   Skin: Negative.   Hematological: Negative.   Psychiatric/Behavioral: Negative.   All other systems reviewed and are negative.    BP 154/77  Pulse 57  Ht 5' 4.5" (1.638 m)  Wt 174 lb (78.926 kg)  BMI 29.41 kg/m2  Physical Exam  Nursing note and vitals reviewed. Constitutional: She is oriented to person, place, and time. She appears well-developed and well-nourished.  HENT:  Head: Normocephalic.  Nose: Nose normal.  Mouth/Throat: Oropharynx is clear and moist.  Eyes: Conjunctivae are normal. Pupils are equal, round, and reactive to light.  Neck: Normal range of motion. Neck supple. No JVD present.  Cardiovascular: Normal rate, regular rhythm, S1 normal, S2 normal, normal heart sounds and intact distal pulses.  Exam reveals no gallop and no friction rub.   No murmur heard. Pulmonary/Chest: Effort normal and breath sounds normal. No respiratory distress. She has no wheezes. She has no  rales. She exhibits no tenderness.  Abdominal: Soft. Bowel sounds are normal. She exhibits no distension. There is no tenderness.  Musculoskeletal: Normal range of motion. She exhibits no edema and no tenderness.  Lymphadenopathy:    She has no cervical adenopathy.  Neurological: She is alert and oriented to person, place, and time. Coordination normal.  Skin: Skin is warm and dry. No rash noted. No erythema.  Psychiatric: She has a normal mood and affect. Her behavior is normal. Judgment and thought content normal.         Assessment and Plan

## 2012-02-07 NOTE — Patient Instructions (Addendum)
You are doing well. No medication changes were made.  Please call us if you have new issues that need to be addressed before your next appt.  Your physician wants you to follow-up in: 6 months.  You will receive a reminder letter in the mail two months in advance. If you don't receive a letter, please call our office to schedule the follow-up appointment.   

## 2012-02-07 NOTE — Assessment & Plan Note (Signed)
Cholesterol is mildly elevated. We have asked her to work on her diet and exercise.

## 2012-02-07 NOTE — Assessment & Plan Note (Signed)
Maintaining normal sinus rhythm with bradycardia. She does not appear symptomatic with heart rates in the 50s to 60s. She would like to continue on amiodarone 200 mg daily.

## 2012-02-07 NOTE — Assessment & Plan Note (Signed)
Blood pressure appears adequate for now without lisinopril. We have asked her to closely monitor her blood pressure. If it starts to trend upward, she could take amlodipine 10 mg daily.

## 2012-03-06 ENCOUNTER — Other Ambulatory Visit: Payer: Self-pay | Admitting: *Deleted

## 2012-03-06 DIAGNOSIS — I10 Essential (primary) hypertension: Secondary | ICD-10-CM

## 2012-03-06 MED ORDER — AMLODIPINE BESYLATE 5 MG PO TABS: 5.0000 mg | ORAL_TABLET | Freq: Every day | ORAL | Status: DC

## 2012-03-17 ENCOUNTER — Encounter: Payer: Self-pay | Admitting: Cardiovascular Disease

## 2012-03-24 ENCOUNTER — Telehealth: Payer: Self-pay

## 2012-03-24 ENCOUNTER — Ambulatory Visit (INDEPENDENT_AMBULATORY_CARE_PROVIDER_SITE_OTHER): Payer: Medicare Other | Admitting: Cardiovascular Disease

## 2012-03-24 ENCOUNTER — Encounter: Payer: Self-pay | Admitting: Cardiovascular Disease

## 2012-03-24 ENCOUNTER — Other Ambulatory Visit: Payer: Self-pay | Admitting: *Deleted

## 2012-03-24 VITALS — BP 120/86 | HR 102 | Ht 64.0 in | Wt 171.8 lb

## 2012-03-24 DIAGNOSIS — I4892 Unspecified atrial flutter: Secondary | ICD-10-CM

## 2012-03-24 DIAGNOSIS — R0602 Shortness of breath: Secondary | ICD-10-CM

## 2012-03-24 DIAGNOSIS — Z8679 Personal history of other diseases of the circulatory system: Secondary | ICD-10-CM

## 2012-03-24 DIAGNOSIS — I10 Essential (primary) hypertension: Secondary | ICD-10-CM

## 2012-03-24 MED ORDER — RIVAROXABAN 20 MG PO TABS: 20.0000 mg | ORAL_TABLET | Freq: Every day | ORAL | Status: DC

## 2012-03-24 NOTE — Progress Notes (Signed)
HPI  This is a 76 year old female who is here today for a followup visit. She is well-known to me. She has history of paroxysmal atrial fibrillation/flutter status post multiple cardioversion in the past. She had previous tachycardia-induced cardiomyopathy which improved after maintaining sinus rhythm. She has history of labile hypertension. She was hospitalized in February with bradycardia, mild hypotension and mild acute renal failure. She was noted to be in sinus bradycardia at that time. She improved with the hydration. She has been feeling well up until early this week when she started having palpitations. Usually her heart rate runs in the 50s. However, this week she noted her heart rate to be between 100- 105. She feels more tired and has increased dyspnea. She denies any chest pain.  Allergies  Allergen Reactions  . Codeine   . Iodine   . Warfarin Sodium      Current Outpatient Prescriptions on File Prior to Visit  Medication Sig Dispense Refill  . amiodarone (PACERONE) 200 MG tablet Take 1 tablet (200 mg total) by mouth daily.  30 tablet  8  . amLODipine (NORVASC) 5 MG tablet Take 1 tablet (5 mg total) by mouth daily.  30 tablet  11  . aspirin 81 MG tablet Take 81 mg by mouth daily.        . Fish Oil OIL Take 5 mLs by mouth daily.        [name] lisinopril (PRINIVIL,ZESTRIL) 10 MG tablet Take 10 mg by mouth as needed.      [name] omeprazole (PRILOSEC) 20 MG capsule Take 20 mg by mouth 2 (two) times daily.       . Red Yeast Rice 600 MG CAPS Take 1 capsule by mouth daily.        . traMADol (ULTRAM) 50 MG tablet Take 50 mg by mouth as needed. For pain       . Rivaroxaban (XARELTO) 20 MG TABS Take 20 mg by mouth daily.  30 tablet  3     Past Medical History  Diagnosis Date  . Atrial fibrillation     Paroxysmal, hx of  . Cardiomyopathy   . Dyspnea   . Headache     chronic  . Hypertension   . Pre-syncope   . Chronic kidney disease     acute renal failure secondary to dehydration  which is now resolved  . Light headedness     due to dehydration  . Atrial flutter 02/2011    s/p cardioversion      Past Surgical History  Procedure Date  . Cardioversion     x 3  . Cholecystectomy   . Appendectomy   . Abdominal hysterectomy   . Multiple orthopedic procedures   . Mastectomy     Bilateral with breast implants  . Cardiac catheterization      Family History  Problem Relation Age of Onset  . Heart disease Mother   . Cancer Father     stomach  . Cancer Sister     breast  . Cancer Maternal Grandmother     breast  . Cancer Sister     breast  . Diabetes Neg Hx      History   Social History  . Marital Status: Married    Spouse Name: N/A    Number of Children: N/A  . Years of Education: N/A   Occupational History  . Not on file.   Social History Main Topics  . Smoking status: Never Smoker   .  Smokeless tobacco: Not on file  . Alcohol Use: No  . Drug Use: No  . Sexually Active: Not on file   Other Topics Concern  . Not on file   Social History Narrative  . No narrative on file      PHYSICAL EXAM   BP 120/86  Pulse 102  Ht 5\' 4"  (1.626 m)  Wt 171 lb 12.8 oz (77.928 kg)  BMI 29.49 kg/m2  Constitutional: She is oriented to person, place, and time. She appears well-developed and well-nourished. No distress.  HENT: No nasal discharge.  Head: Normocephalic and atraumatic.  Eyes: Pupils are equal and round. Right eye exhibits no discharge. Left eye exhibits no discharge.  Neck: Normal range of motion. Neck supple. No JVD present. No thyromegaly present.  Cardiovascular: Slightly tachycardic, irregular rhythm, normal heart sounds. Exam reveals no gallop and no friction rub. No murmur heard.  Pulmonary/Chest: Effort normal and breath sounds normal. No stridor. No respiratory distress. She has no wheezes. She has no rales. She exhibits no tenderness.  Abdominal: Soft. Bowel sounds are normal. She exhibits no distension. There is no  tenderness. There is no rebound and no guarding.  Musculoskeletal: Normal range of motion. She exhibits no edema and no tenderness.  Neurological: She is alert and oriented to person, place, and time. Coordination normal.  Skin: Skin is warm and dry. No rash noted. She is not diaphoretic. No erythema. No pallor.  Psychiatric: She has a normal mood and affect. Her behavior is normal. Judgment and thought content normal.     EKG: Atypical Atrial flutter with variable AV block. There is right bundle branch block.   ASSESSMENT AND PLAN

## 2012-03-24 NOTE — Telephone Encounter (Signed)
Notified [name] [name] for prior authorization regarding the xarelto 20 me take one tablet daily. The xarelto is approved with ref number [id number]. Notified medicap/patient xarelto is approved. The patient will call back if wants the samples from [facility] office.

## 2012-03-24 NOTE — Assessment & Plan Note (Signed)
The patient is currently in atypical atrial flutter and is mildly tachycardic. She appears to be symptomatic. She has known history of bradycardia and I'm hesitant to increase amiodarone or add any other agent for rate control. She seems to be symptomatic with this and thus I recommend cardioversion in few weeks. She will need to be fully anticoagulated. Her [place] VAS score is 3 and thus she is at moderate risk for thromboembolic complications. Due to that, I recommend lifelong anticoagulation if there is no contraindication. She reports previous problems with warfarin in the past but she did well on Pradaxa.  I recommend starting Xarelto 20 mg once daily. I will check routine labs today including CBC, CMP, TSH and PT/INR. We need to check her thyroid and liver test given that she is on amiodarone. I will also request a 2-D echo to evaluate her LV systolic function and atrial size. I will consider switching to a different antiarrhythmic medication if her ejection fraction is normal.

## 2012-03-24 NOTE — Assessment & Plan Note (Signed)
Her blood pressure is well controlled. Continue current medications. She is not having hypotension anymore.   

## 2012-03-24 NOTE — Patient Instructions (Addendum)
Your physician has recommended you make the following change in your medication: START XARELTO 20 MG 1 TABLET DAILY  Your physician recommends that you return for lab work in: TODAY CBC W/DIFF, BMET, LFT, TSH, PT/INR.  Your physician has requested that you have an echocardiogram 04/11/12 @ 8:30 AM AT THE [place] OFFICE.DX DYSPNEA AND A-FLUTTER. Echocardiography is a painless test that uses sound waves to create images of your heart. It provides your doctor with information about the size and shape of your heart and how well your heart's chambers and valves are working. This procedure takes approximately one hour. There are no restrictions for this procedure.  Your physician recommends that you schedule a follow-up appointment in: 04/13/12 @10  am to see  Dr. Kirke Corin

## 2012-03-25 LAB — BASIC METABOLIC PANEL
BUN/Creatinine Ratio: 17 (ref 11–26)
BUN: 18 mg/dL (ref 8–27)
CO2: 19 mmol/L — ABNORMAL LOW (ref 20–32)
Calcium: 9.4 mg/dL (ref 8.6–10.2)
Chloride: 103 mmol/L (ref 97–108)
Creatinine, Ser: 1.09 mg/dL — ABNORMAL HIGH (ref 0.57–1.00)
GFR calc Af Amer: 58 mL/min/{1.73_m2} — ABNORMAL LOW (ref >59)
GFR calc non Af Amer: 50 mL/min/{1.73_m2} — ABNORMAL LOW (ref >59)
Glucose: 109 mg/dL — ABNORMAL HIGH (ref 65–99)
Potassium: 4.2 mmol/L (ref 3.5–5.2)
Sodium: 139 mmol/L (ref 134–144)

## 2012-03-25 LAB — CBC WITH DIFFERENTIAL/PLATELET
Basophils Absolute: 0 10*3/uL (ref 0.0–0.2)
Basos: 1 % (ref 0–3)
Eos: 1 % (ref 0–7)
Eosinophils Absolute: 0 10*3/uL (ref 0.0–0.4)
HCT: 43.6 % (ref 34.0–46.6)
Hemoglobin: 14.7 g/dL (ref 11.1–15.9)
Immature Grans (Abs): 0 10*3/uL (ref 0.0–0.1)
Immature Granulocytes: 0 % (ref 0–2)
Lymphocytes Absolute: 1 10*3/uL (ref 0.7–4.5)
Lymphs: 18 % (ref 14–46)
MCH: 33.1 pg — ABNORMAL HIGH (ref 26.6–33.0)
MCHC: 33.7 g/dL (ref 31.5–35.7)
MCV: 98 fL — ABNORMAL HIGH (ref 79–97)
Monocytes Absolute: 0.3 10*3/uL (ref 0.1–1.0)
Monocytes: 6 % (ref 4–13)
Neutrophils Absolute: 4.2 10*3/uL (ref 1.8–7.8)
Neutrophils Relative %: 74 % (ref 40–74)
RBC: 4.44 x10E6/uL (ref 3.77–5.28)
RDW: 13.5 % (ref 12.3–15.4)
WBC: 5.6 10*3/uL (ref 4.0–10.5)

## 2012-03-25 LAB — HEPATIC FUNCTION PANEL
ALT: 18 IU/L (ref 0–32)
AST: 17 IU/L (ref 0–40)
Albumin: 4.2 g/dL (ref 3.5–4.8)
Alkaline Phosphatase: 55 IU/L (ref 25–165)
Bilirubin, Direct: 0.13 mg/dL (ref 0.00–0.40)
Total Bilirubin: 0.4 mg/dL (ref 0.0–1.2)
Total Protein: 6.8 g/dL (ref 6.0–8.5)

## 2012-03-25 LAB — PROTIME-INR
INR: 1 (ref 0.8–1.2)
Prothrombin Time: 10.5 s (ref 9.1–12.0)

## 2012-03-25 LAB — TSH: TSH: 1.57 u[IU]/mL (ref 0.450–4.500)

## 2012-03-30 ENCOUNTER — Other Ambulatory Visit: Payer: Self-pay

## 2012-03-30 ENCOUNTER — Other Ambulatory Visit (INDEPENDENT_AMBULATORY_CARE_PROVIDER_SITE_OTHER): Payer: Medicare Other

## 2012-03-30 DIAGNOSIS — I4891 Unspecified atrial fibrillation: Secondary | ICD-10-CM

## 2012-03-30 DIAGNOSIS — R0602 Shortness of breath: Secondary | ICD-10-CM

## 2012-03-30 DIAGNOSIS — I4892 Unspecified atrial flutter: Secondary | ICD-10-CM

## 2012-04-10 ENCOUNTER — Institutional Professional Consult (permissible substitution): Payer: Medicare Other | Admitting: Cardiovascular Disease

## 2012-04-11 ENCOUNTER — Other Ambulatory Visit: Payer: Medicare Other

## 2012-04-13 ENCOUNTER — Encounter: Payer: Self-pay | Admitting: Cardiovascular Disease

## 2012-04-13 ENCOUNTER — Ambulatory Visit (INDEPENDENT_AMBULATORY_CARE_PROVIDER_SITE_OTHER): Payer: Medicare Other | Admitting: Cardiovascular Disease

## 2012-04-13 VITALS — BP 138/84 | HR 93 | Ht 64.0 in | Wt 169.5 lb

## 2012-04-13 DIAGNOSIS — I10 Essential (primary) hypertension: Secondary | ICD-10-CM

## 2012-04-13 DIAGNOSIS — Z8679 Personal history of other diseases of the circulatory system: Secondary | ICD-10-CM

## 2012-04-13 DIAGNOSIS — I4891 Unspecified atrial fibrillation: Secondary | ICD-10-CM

## 2012-04-13 NOTE — Assessment & Plan Note (Signed)
The patient continues to be in atypical atrial flutter. She appears to be symptomatic.  She is tolerating Xarelto 20 mg once daily. Her labs were okay. Continue amiodarone 200 mg once daily. Liver and thyroid functions were normal Her echocardiogram showed low normal LV systolic function. I recommend proceeding with cardioversion next week. She is to continue taking Xarelto once daily.

## 2012-04-13 NOTE — Patient Instructions (Addendum)
Cardioversion for next week on Apr 21, 2012 arrive at 6:30 am at [facility]. Follow up 1 week after cardioversion.

## 2012-04-13 NOTE — Assessment & Plan Note (Signed)
Her blood pressure is well controlled. Continue current medications. She is not having hypotension anymore.

## 2012-04-13 NOTE — Progress Notes (Signed)
HPI  This is a 76 year old female who is here today for a followup visit. She has history of paroxysmal atrial fibrillation/flutter status post multiple cardioversions in the past. She had previous tachycardia-induced cardiomyopathy which improved after maintaining sinus rhythm. She has history of labile hypertension. She was hospitalized in February with bradycardia, mild hypotension and mild acute renal failure. She was noted to be in sinus bradycardia at that time. She improved with the hydration. She has been feeling well up until early this week when she started having palpitations. Usually her heart rate runs in the 50s. She was seen recently due to palpitations and was found to be in atypical atrial flutter.  She feels more tired and has increased dyspnea. She denies any chest pain. She was started on Xarelto 20 mg once daily. Labs were unremarkable. Echo showed an EF of 50-5% with mild biatrial enlargement and mild pulmonary hypertension.  She has been taking Xarelto daily without side effects.    Allergies  Allergen Reactions  . Codeine   . Iodine   . Warfarin Sodium      Current Outpatient Prescriptions on File Prior to Visit  Medication Sig Dispense Refill  . amiodarone (PACERONE) 200 MG tablet Take 1 tablet (200 mg total) by mouth daily.  30 tablet  8  . amLODipine (NORVASC) 5 MG tablet Take 1 tablet (5 mg total) by mouth daily.  30 tablet  11  . cetirizine (ZYRTEC) 10 MG tablet Take 10 mg by mouth daily.      . Fish Oil OIL Take 5 mLs by mouth daily.        [name] omeprazole (PRILOSEC) 20 MG capsule Take 20 mg by mouth 2 (two) times daily.       . Red Yeast Rice 600 MG CAPS Take 1 capsule by mouth daily.        . Rivaroxaban (XARELTO) 20 MG TABS Take 20 mg by mouth daily.  30 tablet  3  . traMADol (ULTRAM) 50 MG tablet Take 50 mg by mouth as needed. For pain          Past Medical History  Diagnosis Date  . Atrial fibrillation     Paroxysmal, hx of  . Cardiomyopathy   .  Dyspnea   . Headache     chronic  . Hypertension   . Pre-syncope   . Chronic kidney disease     acute renal failure secondary to dehydration which is now resolved  . Light headedness     due to dehydration  . Atrial flutter 02/2011    s/p cardioversion      Past Surgical History  Procedure Date  . Cardioversion     x 3  . Cholecystectomy   . Appendectomy   . Abdominal hysterectomy   . Multiple orthopedic procedures   . Mastectomy     Bilateral with breast implants  . Cardiac catheterization      Family History  Problem Relation Age of Onset  . Heart disease Mother   . Cancer Father     stomach  . Cancer Sister     breast  . Cancer Maternal Grandmother     breast  . Cancer Sister     breast  . Diabetes Neg Hx      History   Social History  . Marital Status: Married    Spouse Name: N/A    Number of Children: N/A  . Years of Education: N/A   Occupational History  .  Not on file.   Social History Main Topics  . Smoking status: Never Smoker   . Smokeless tobacco: Not on file  . Alcohol Use: No  . Drug Use: No  . Sexually Active: Not on file   Other Topics Concern  . Not on file   Social History Narrative  . No narrative on file     PHYSICAL EXAM   BP 138/84  Pulse 68  Ht 5\' 4"  (1.626 m)  Wt 169 lb 8 oz (76.885 kg)  BMI 29.09 kg/m2  Constitutional: She is oriented to person, place, and time. She appears well-developed and well-nourished. No distress.  HENT: No nasal discharge.  Head: Normocephalic and atraumatic.  Eyes: Pupils are equal and round. Right eye exhibits no discharge. Left eye exhibits no discharge.  Neck: Normal range of motion. Neck supple. No JVD present. No thyromegaly present.  Cardiovascular: Normal rate, regular rhythm, normal heart sounds. Exam reveals no gallop and no friction rub. No murmur heard.  Pulmonary/Chest: Effort normal and breath sounds normal. No stridor. No respiratory distress. She has no wheezes. She has  no rales. She exhibits no tenderness.  Abdominal: Soft. Bowel sounds are normal. She exhibits no distension. There is no tenderness. There is no rebound and no guarding.  Musculoskeletal: Normal range of motion. She exhibits no edema and no tenderness.  Neurological: She is alert and oriented to person, place, and time. Coordination normal.  Skin: Skin is warm and dry. No rash noted. She is not diaphoretic. No erythema. No pallor.  Psychiatric: She has a normal mood and affect. Her behavior is normal. Judgment and thought content normal.    EKG: Atypical atrial flutter with variable AV block. RBBB.    ASSESSMENT AND PLAN

## 2012-04-16 ENCOUNTER — Telehealth: Payer: Self-pay | Admitting: Physician Assistant

## 2012-04-16 NOTE — Telephone Encounter (Signed)
Pt called requesting possible rescheduling of planned DCCV, scheduled for next Friday, 04/21/12, with Dr Kirke Corin. She received news yesterday that her son had died suddenly. She is hoping to have procedure done earlier in week, so as to tend to funeral arrangements. I expressed my condolences to her, and asked that she contact our [place] office in AM to speak with Dr Jari Sportsman RN, regarding this issue. She was appreciative of the call back.

## 2012-04-17 ENCOUNTER — Telehealth: Payer: Self-pay | Admitting: Cardiovascular Disease

## 2012-04-17 NOTE — Telephone Encounter (Signed)
Pt calling to try and get her cardioversion. Pt son died unexpectedly and not sure when the arrangements are going to be. She was asking for it to be done tomorrow and I explained to the pt that we may not be able to do it that soon. Please call pt to have this RS

## 2012-04-18 NOTE — Telephone Encounter (Signed)
The patient will call back to reschedule the cardioversion after her son's funeral.

## 2012-04-26 ENCOUNTER — Other Ambulatory Visit: Payer: Self-pay

## 2012-04-26 ENCOUNTER — Other Ambulatory Visit: Payer: Medicare Other

## 2012-04-26 DIAGNOSIS — Z01810 Encounter for preprocedural cardiovascular examination: Secondary | ICD-10-CM

## 2012-04-26 DIAGNOSIS — Z8679 Personal history of other diseases of the circulatory system: Secondary | ICD-10-CM

## 2012-04-26 DIAGNOSIS — Z5181 Encounter for therapeutic drug level monitoring: Secondary | ICD-10-CM

## 2012-04-27 ENCOUNTER — Other Ambulatory Visit: Payer: Self-pay | Admitting: Cardiovascular Disease

## 2012-04-27 DIAGNOSIS — Z8679 Personal history of other diseases of the circulatory system: Secondary | ICD-10-CM

## 2012-04-27 DIAGNOSIS — Z01810 Encounter for preprocedural cardiovascular examination: Secondary | ICD-10-CM

## 2012-04-27 LAB — CBC WITH DIFFERENTIAL/PLATELET
Basophils Absolute: 0 10*3/uL (ref 0.0–0.2)
Basos: 0 % (ref 0–3)
Eos: 1 % (ref 0–7)
Eosinophils Absolute: 0.1 10*3/uL (ref 0.0–0.4)
HCT: 44.7 % (ref 34.0–46.6)
Hemoglobin: 14.1 g/dL (ref 11.1–15.9)
Immature Grans (Abs): 0 10*3/uL (ref 0.0–0.1)
Immature Granulocytes: 0 % (ref 0–2)
Lymphocytes Absolute: 1.1 10*3/uL (ref 0.7–4.5)
Lymphs: 16 % (ref 14–46)
MCH: 31.3 pg (ref 26.6–33.0)
MCHC: 31.5 g/dL (ref 31.5–35.7)
MCV: 99 fL — ABNORMAL HIGH (ref 79–97)
Monocytes Absolute: 0.3 10*3/uL (ref 0.1–1.0)
Monocytes: 4 % (ref 4–13)
Neutrophils Absolute: 5.3 10*3/uL (ref 1.8–7.8)
Neutrophils Relative %: 79 % — ABNORMAL HIGH (ref 40–74)
RBC: 4.51 x10E6/uL (ref 3.77–5.28)
RDW: 13.5 % (ref 12.3–15.4)
WBC: 6.8 10*3/uL (ref 4.0–10.5)

## 2012-04-27 LAB — BASIC METABOLIC PANEL
BUN/Creatinine Ratio: 14 (ref 11–26)
BUN: 14 mg/dL (ref 8–27)
CO2: 23 mmol/L (ref 20–32)
Calcium: 9.2 mg/dL (ref 8.6–10.2)
Chloride: 102 mmol/L (ref 97–108)
Creatinine, Ser: 1 mg/dL (ref 0.57–1.00)
GFR calc Af Amer: 64 mL/min/{1.73_m2} (ref >59)
GFR calc non Af Amer: 56 mL/min/{1.73_m2} — ABNORMAL LOW (ref >59)
Glucose: 162 mg/dL — ABNORMAL HIGH (ref 65–99)
Potassium: 4 mmol/L (ref 3.5–5.2)
Sodium: 138 mmol/L (ref 134–144)

## 2012-04-27 LAB — PROTIME-INR
INR: 1 (ref 0.8–1.2)
Prothrombin Time: 11.2 s (ref 9.1–12.0)

## 2012-04-28 ENCOUNTER — Ambulatory Visit: Payer: Medicare Other | Admitting: Cardiovascular Disease

## 2012-05-02 ENCOUNTER — Ambulatory Visit: Payer: Self-pay | Admitting: Cardiovascular Disease

## 2012-05-02 DIAGNOSIS — I4892 Unspecified atrial flutter: Secondary | ICD-10-CM

## 2012-05-04 ENCOUNTER — Encounter: Payer: Self-pay | Admitting: Cardiovascular Disease

## 2012-05-09 ENCOUNTER — Encounter: Payer: Self-pay | Admitting: Cardiovascular Disease

## 2012-05-09 ENCOUNTER — Ambulatory Visit (INDEPENDENT_AMBULATORY_CARE_PROVIDER_SITE_OTHER): Payer: Medicare Other | Admitting: Cardiovascular Disease

## 2012-05-09 DIAGNOSIS — F419 Anxiety disorder, unspecified: Secondary | ICD-10-CM

## 2012-05-09 DIAGNOSIS — R0989 Other specified symptoms and signs involving the circulatory and respiratory systems: Secondary | ICD-10-CM

## 2012-05-09 DIAGNOSIS — I10 Essential (primary) hypertension: Secondary | ICD-10-CM

## 2012-05-09 DIAGNOSIS — F341 Dysthymic disorder: Secondary | ICD-10-CM

## 2012-05-09 DIAGNOSIS — I4819 Other persistent atrial fibrillation: Secondary | ICD-10-CM | POA: Insufficient documentation

## 2012-05-09 DIAGNOSIS — I4891 Unspecified atrial fibrillation: Secondary | ICD-10-CM

## 2012-05-09 DIAGNOSIS — F32A Depression, unspecified: Secondary | ICD-10-CM | POA: Insufficient documentation

## 2012-05-09 DIAGNOSIS — F329 Major depressive disorder, single episode, unspecified: Secondary | ICD-10-CM

## 2012-05-09 MED ORDER — SERTRALINE HCL 50 MG PO TABS: 50.0000 mg | ORAL_TABLET | Freq: Every day | ORAL | Status: DC

## 2012-05-09 NOTE — Patient Instructions (Signed)
Start Sertraline (Zoloft) 50 mg once daily.  Take Benadryl 25-50 mg at bedtime as needed.  Follow up in 1 month.

## 2012-05-09 NOTE — Assessment & Plan Note (Signed)
Her blood pressure is well controlled. Continue treatment with amlodipine.

## 2012-05-09 NOTE — Progress Notes (Signed)
HPI  [name] is a 76 year old female who is here today for a followup visit. She had a recent successful cardioversion for atypical atrial flutter and converted to normal sinus rhythm. She has been doing well from a cardiac standpoint since then. However, she is having significant problems with anxiety, depression and insomnia after the recent sudden death of her son. She is very tearful which is unusual for her. She is only able to get a few hours of sleep every night. She is very tired during the day.  Allergies  Allergen Reactions  . Codeine   . Iodine   . Warfarin Sodium      Current Outpatient Prescriptions on File Prior to Visit  Medication Sig Dispense Refill  . amiodarone (PACERONE) 200 MG tablet Take 1 tablet (200 mg total) by mouth daily.  30 tablet  8  . amLODipine (NORVASC) 5 MG tablet Take 1 tablet (5 mg total) by mouth daily.  30 tablet  11  . Fish Oil OIL Take 5 mLs by mouth daily.        [name] omeprazole (PRILOSEC) 20 MG capsule Take 20 mg by mouth 2 (two) times daily.       . Red Yeast Rice 600 MG CAPS Take 1 capsule by mouth daily.       . Rivaroxaban (XARELTO) 20 MG TABS Take 20 mg by mouth daily.  30 tablet  3  . traMADol (ULTRAM) 50 MG tablet Take 50 mg by mouth as needed. For pain       . sertraline (ZOLOFT) 50 MG tablet Take 1 tablet (50 mg total) by mouth daily.  30 tablet  3     Past Medical History  Diagnosis Date  . Atrial fibrillation     Paroxysmal, hx of  . Cardiomyopathy   . Dyspnea   . Headache     chronic  . Hypertension   . Pre-syncope   . Chronic kidney disease     acute renal failure secondary to dehydration which is now resolved  . Light headedness     due to dehydration  . Atrial flutter 02/2011    s/p cardioversion      Past Surgical History  Procedure Date  . Cardioversion     x 3  . Cholecystectomy   . Appendectomy   . Abdominal hysterectomy   . Multiple orthopedic procedures   . Mastectomy     Bilateral with breast  implants  . Cardiac catheterization      Family History  Problem Relation Age of Onset  . Heart disease Mother   . Cancer Father     stomach  . Cancer Sister     breast  . Cancer Maternal Grandmother     breast  . Cancer Sister     breast  . Diabetes Neg Hx      History   Social History  . Marital Status: Married    Spouse Name: N/A    Number of Children: N/A  . Years of Education: N/A   Occupational History  . Not on file.   Social History Main Topics  . Smoking status: Never Smoker   . Smokeless tobacco: Not on file  . Alcohol Use: No  . Drug Use: No  . Sexually Active: Not on file   Other Topics Concern  . Not on file   Social History Narrative  . No narrative on file      PHYSICAL EXAM   BP 139/69  Pulse 56  Ht 5\' 4"  (1.626 m)  Wt 168 lb (76.204 kg)  BMI 28.84 kg/m2  Constitutional: She is oriented to person, place, and time. She appears well-developed and well-nourished. No distress.  HENT: No nasal discharge.  Head: Normocephalic and atraumatic.  Eyes: Pupils are equal and round. Right eye exhibits no discharge. Left eye exhibits no discharge.  Neck: Normal range of motion. Neck supple. No JVD present. No thyromegaly present.  Cardiovascular: Normal rate, regular rhythm, normal heart sounds. Exam reveals no gallop and no friction rub. No murmur heard.  Pulmonary/Chest: Effort normal and breath sounds normal. No stridor. No respiratory distress. She has no wheezes. She has no rales. She exhibits no tenderness.  Abdominal: Soft. Bowel sounds are normal. She exhibits no distension. There is no tenderness. There is no rebound and no guarding.  Musculoskeletal: Normal range of motion. She exhibits no edema and no tenderness.  Neurological: She is alert and oriented to person, place, and time. Coordination normal.  Skin: Skin is warm and dry. No rash noted. She is not diaphoretic. No erythema. No pallor.  Psychiatric: She has a normal mood and  affect. Her behavior is normal. Judgment and thought content normal.    ZOX:WRUEA  Bradycardia  -Right bundle branch block.    ASSESSMENT AND PLAN

## 2012-05-09 NOTE — Assessment & Plan Note (Signed)
She converted to normal sinus rhythm after recent successful cardioversion. Continue current treatment with amiodarone 200 mg once daily as well as anticoagulation with Xarelto 20 mg once daily. Her LFTs and TSH were checked in April of this year and were normal.

## 2012-05-09 NOTE — Assessment & Plan Note (Signed)
This is related to the recent death of her son. I recommend starting sertraline 50 mg once daily. She can also use Benadryl as needed at bedtime to help her sleep.

## 2012-06-09 ENCOUNTER — Encounter: Payer: Self-pay | Admitting: Cardiovascular Disease

## 2012-06-09 ENCOUNTER — Ambulatory Visit (INDEPENDENT_AMBULATORY_CARE_PROVIDER_SITE_OTHER): Payer: Medicare Other | Admitting: Cardiovascular Disease

## 2012-06-09 VITALS — BP 110/60 | HR 62 | Ht 64.0 in | Wt 168.0 lb

## 2012-06-09 DIAGNOSIS — F341 Dysthymic disorder: Secondary | ICD-10-CM

## 2012-06-09 DIAGNOSIS — I4891 Unspecified atrial fibrillation: Secondary | ICD-10-CM

## 2012-06-09 DIAGNOSIS — I10 Essential (primary) hypertension: Secondary | ICD-10-CM

## 2012-06-09 DIAGNOSIS — F32A Depression, unspecified: Secondary | ICD-10-CM

## 2012-06-09 DIAGNOSIS — F329 Major depressive disorder, single episode, unspecified: Secondary | ICD-10-CM

## 2012-06-09 DIAGNOSIS — I4819 Other persistent atrial fibrillation: Secondary | ICD-10-CM

## 2012-06-09 NOTE — Assessment & Plan Note (Signed)
She is maintaining in normal sinus rhythm with amiodarone after recent successful cardioversion. Continue current treatment with amiodarone 200 mg once daily as well as anticoagulation with Xarelto 20 mg once daily. Her LFTs and TSH were checked in April of this year and were normal. I will plan on checking these once or twice a year.

## 2012-06-09 NOTE — Patient Instructions (Addendum)
Continue same medications.  Follow up in 6 months.  

## 2012-06-09 NOTE — Assessment & Plan Note (Signed)
Her symptoms improved significantly after starting treatment with Sertraline  which will be continued at the current dose.

## 2012-06-09 NOTE — Assessment & Plan Note (Signed)
Her blood pressure is well controlled. Continue current medications. 

## 2012-06-09 NOTE — Progress Notes (Signed)
HPI  [name] [name] is a 76 year old female who is here today for a followup visit. She had a recent successful cardioversion for atypical atrial flutter and converted to normal sinus rhythm. She has been doing well from a cardiac standpoint since then. During her last visit, she was noted to have significant anxiety, depression and insomnia related to the sudden death of her son. She was started on sertraline 50 mg once daily. Today, she reports significant improvement in her symptoms. She went on a trip to [place] where her son lives to take care of some business they have. She reports no chest pain or dyspnea. No palpitations. She has mild lower extremity edema at the end of the day which resolved in the morning.   Allergies  Allergen Reactions  . Codeine   . Iodine   . Warfarin Sodium      Current Outpatient Prescriptions on File Prior to Visit  Medication Sig Dispense Refill  . amiodarone (PACERONE) 200 MG tablet Take 1 tablet (200 mg total) by mouth daily.  30 tablet  8  . amLODipine (NORVASC) 5 MG tablet Take 1 tablet (5 mg total) by mouth daily.  30 tablet  11  . omeprazole (PRILOSEC) 20 MG capsule Take 20 mg by mouth 2 (two) times daily.       . Rivaroxaban (XARELTO) 20 MG TABS Take 20 mg by mouth daily.  30 tablet  3  . sertraline (ZOLOFT) 50 MG tablet Take 1 tablet (50 mg total) by mouth daily.  30 tablet  3  . traMADol (ULTRAM) 50 MG tablet Take 50 mg by mouth as needed. For pain          Past Medical History  Diagnosis Date  . Atrial fibrillation     Paroxysmal, hx of  . Cardiomyopathy   . Dyspnea   . Headache     chronic  . Hypertension   . Pre-syncope   . Chronic kidney disease     acute renal failure secondary to dehydration which is now resolved  . Light headedness     due to dehydration  . Atrial flutter 02/2011    s/p cardioversion      Past Surgical History  Procedure Date  . Cardioversion     x 3  . Cholecystectomy   . Appendectomy   .  Abdominal hysterectomy   . Multiple orthopedic procedures   . Mastectomy     Bilateral with breast implants  . Cardiac catheterization      Family History  Problem Relation Age of Onset  . Heart disease Mother   . Cancer Father     stomach  . Cancer Sister     breast  . Cancer Maternal Grandmother     breast  . Cancer Sister     breast  . Diabetes Neg Hx      History   Social History  . Marital Status: Married    Spouse Name: N/A    Number of Children: N/A  . Years of Education: N/A   Occupational History  . Not on file.   Social History Main Topics  . Smoking status: Never Smoker   . Smokeless tobacco: Not on file  . Alcohol Use: No  . Drug Use: No  . Sexually Active: Not on file   Other Topics Concern  . Not on file   Social History Narrative  . No narrative on file      PHYSICAL EXAM   BP  110/60  Pulse 62  Ht 5\' 4"  (1.626 m)  Wt 168 lb (76.204 kg)  BMI 28.84 kg/m2 Constitutional: She is oriented to person, place, and time. She appears well-developed and well-nourished. No distress.  HENT: No nasal discharge.  Head: Normocephalic and atraumatic.  Eyes: Pupils are equal and round. Right eye exhibits no discharge. Left eye exhibits no discharge.  Neck: Normal range of motion. Neck supple. No JVD present. No thyromegaly present.  Cardiovascular: Normal rate, regular rhythm, normal heart sounds. Exam reveals no gallop and no friction rub. No murmur heard.  Pulmonary/Chest: Effort normal and breath sounds normal. No stridor. No respiratory distress. She has no wheezes. She has no rales. She exhibits no tenderness.  Abdominal: Soft. Bowel sounds are normal. She exhibits no distension. There is no tenderness. There is no rebound and no guarding.  Musculoskeletal: Normal range of motion. She exhibits no edema and no tenderness.  Neurological: She is alert and oriented to person, place, and time. Coordination normal.  Skin: Skin is warm and dry. No rash  noted. She is not diaphoretic. No erythema. No pallor.  Psychiatric: She has a normal mood and affect. Her behavior is normal. Judgment and thought content normal.      ASSESSMENT AND PLAN

## 2012-06-20 ENCOUNTER — Ambulatory Visit (INDEPENDENT_AMBULATORY_CARE_PROVIDER_SITE_OTHER): Payer: Medicare Other | Admitting: Internal Medicine

## 2012-06-20 ENCOUNTER — Encounter: Payer: Self-pay | Admitting: Internal Medicine

## 2012-06-20 VITALS — BP 132/60 | HR 58 | Temp 98.3°F | Resp 14 | Ht 62.5 in | Wt 169.0 lb

## 2012-06-20 DIAGNOSIS — R0982 Postnasal drip: Secondary | ICD-10-CM

## 2012-06-20 DIAGNOSIS — I4891 Unspecified atrial fibrillation: Secondary | ICD-10-CM

## 2012-06-20 DIAGNOSIS — E538 Deficiency of other specified B group vitamins: Secondary | ICD-10-CM

## 2012-06-20 DIAGNOSIS — R5383 Other fatigue: Secondary | ICD-10-CM

## 2012-06-20 DIAGNOSIS — E785 Hyperlipidemia, unspecified: Secondary | ICD-10-CM

## 2012-06-20 DIAGNOSIS — I10 Essential (primary) hypertension: Secondary | ICD-10-CM

## 2012-06-20 DIAGNOSIS — Z8619 Personal history of other infectious and parasitic diseases: Secondary | ICD-10-CM

## 2012-06-20 DIAGNOSIS — R5381 Other malaise: Secondary | ICD-10-CM

## 2012-06-20 DIAGNOSIS — I4819 Other persistent atrial fibrillation: Secondary | ICD-10-CM

## 2012-06-20 DIAGNOSIS — R0989 Other specified symptoms and signs involving the circulatory and respiratory systems: Secondary | ICD-10-CM

## 2012-06-20 DIAGNOSIS — R0609 Other forms of dyspnea: Secondary | ICD-10-CM

## 2012-06-20 DIAGNOSIS — R0683 Snoring: Secondary | ICD-10-CM

## 2012-06-20 DIAGNOSIS — A938 Other specified arthropod-borne viral fevers: Secondary | ICD-10-CM

## 2012-06-20 MED ORDER — DOXYCYCLINE HYCLATE 100 MG PO TABS: 100.0000 mg | ORAL_TABLET | Freq: Two times a day (BID) | ORAL | Status: DC

## 2012-06-20 NOTE — Progress Notes (Signed)
Patient ID: [name] [name], female   DOB: [birth year], 76 y.o.   MRN: [id number]  Patient Active Problem List  Diagnosis  . HYPERLIPIDEMIA-MIXED  . HYPERTENSION  . HEADACHE, CHRONIC  . DYSPNEA  . TACHYCARDIA  . Persistent atrial fibrillation  . Anxiety and depression  . Fatigue  . History of [place] spotted fever    Subjective:  CC:   Chief Complaint  Patient presents with  . New Patient    HPI:   [name] [name] is a 76 y.o. female who presents as a new patient to establish primary care with the chief complaint of  Fatigue.  She is 76 year old female who is transferring form [facility] [facility].  She has a history of snoring with frequent dispruptions.  She has several sleep  study tests over the past ten years, which resulted in the prescription of nocturnal 02 temporarily without a diagnosis of OSA. [name]  She has had recent development of fatigue and wonders if she has contracted tick borne illness again since she has a history of RMSF last year and has found a tick last week on her thigh.  Denies headaches, fevers, and rash.     Past Medical History  Diagnosis Date  . Atrial fibrillation     Paroxysmal, hx of  . Cardiomyopathy   . Dyspnea   . Headache     chronic  . Hypertension   . Pre-syncope   . Chronic kidney disease     acute renal failure secondary to dehydration which is now resolved  . Light headedness     due to dehydration  . Atrial flutter 02/2011    s/p cardioversion   . Hyperlipidemia     Past Surgical History  Procedure Date  . Cardioversion     x 3  . Cholecystectomy   . Appendectomy   . Multiple orthopedic procedures   . Mastectomy 1986    Bilateral with silicone  breast implants, s/p saline replacements  . Cardiac catheterization   . Combined augmentation mammaplasty and abdominoplasty   . Abdominal hysterectomy 1990    Family History  Problem Relation Age of Onset  . Heart disease Mother   . Cancer Father     stomach  . Cancer  Sister     breast  . Cancer Maternal Grandmother     breast  . Cancer Sister     breast  . Diabetes Neg Hx   . Heart disease Son     found at autopsy    History   Social History  . Marital Status: Married    Spouse Name: N/A    Number of Children: N/A  . Years of Education: N/A   Occupational History  . Not on file.   Social History Main Topics  . Smoking status: Never Smoker   . Smokeless tobacco: Never Used  . Alcohol Use: No  . Drug Use: No  . Sexually Active: Not on file   Other Topics Concern  . Not on file   Social History Narrative  . No narrative on file         @ALLHX @    Review of Systems:   The remainder of the review of systems was negative except those addressed in the HPI.       Objective:  BP 132/60  Pulse 58  Temp 98.3 F (36.8 C) (Oral)  Resp 14  Ht 5' 2.5" (1.588 m)  Wt 169 lb (76.658 kg)  BMI 30.42 kg/m2  SpO2 96%  General appearance: alert, cooperative and appears stated age Ears: normal TM's and external ear canals both ears Throat: lips, mucosa, and tongue normal; teeth and gums normal Neck: no adenopathy, no carotid bruit, supple, symmetrical, trachea midline and thyroid not enlarged, symmetric, no tenderness/mass/nodules Back: symmetric, no curvature. ROM normal. No CVA tenderness. Lungs: clear to auscultation bilaterally Heart: regular rate and rhythm, S1, S2 normal, no murmur, click, rub or gallop Abdomen: soft, non-tender; bowel sounds normal; no masses,  no organomegaly Pulses: 2+ and symmetric Skin: Skin color, texture, turgor normal. No rashes or lesions Lymph nodes: Cervical, supraclavicular, and axillary nodes normal.  Assessment and Plan:  HYPERTENSION previously on lisinopril.  Not sure why it was stopped.  Has atrial fib with slow heart rate.  Amlodipine started by Dr. Mariah Milling.  Occasionally notes ankle edema by the end of the day relieved with elevation of legs.  No changes today.   Fatigue Likely  multifactorial,  Aggravated by snoring and poor sleep quality. Discussed trial of afrin and saline sinus lavage at  Bedtime to manage congestion which may be contributing.  Given recent tick bite will treat empirically for RMSF.   Persistent atrial fibrillation Managed by Dossie Arbour with amiodarone. Thromboembolic risk managed with xarelto.   Updated Medication List Outpatient Encounter Prescriptions as of 06/20/2012  Medication Sig Dispense Refill  . amiodarone (PACERONE) 200 MG tablet Take 1 tablet (200 mg total) by mouth daily.  30 tablet  8  . amLODipine (NORVASC) 5 MG tablet Take 1 tablet (5 mg total) by mouth daily.  30 tablet  11  . Omega-3 Fatty Acids (FISH OIL) 1200 MG CAPS Take 1,200 mg by mouth daily.      [name] omeprazole (PRILOSEC) 20 MG capsule Take 20 mg by mouth 2 (two) times daily.       . Red Yeast Rice Extract (RED YEAST RICE PO) Take 1,200 mg by mouth daily.      . Rivaroxaban (XARELTO) 20 MG TABS Take 20 mg by mouth daily.  30 tablet  3  . traMADol (ULTRAM) 50 MG tablet Take 50 mg by mouth as needed. For pain       . doxycycline (VIBRA-TABS) 100 MG tablet Take 1 tablet (100 mg total) by mouth 2 (two) times daily.  14 tablet  0  . DISCONTD: sertraline (ZOLOFT) 50 MG tablet Take 1 tablet (50 mg total) by mouth daily.  30 tablet  3     Orders Placed This Encounter  Procedures  . HM MAMMOGRAPHY  . Lipid panel  . COMPLETE METABOLIC PANEL WITH GFR  . Vitamin B12  . [place] spotted fvr ab, IgM-blood  . HM COLONOSCOPY    No Follow-up on file.

## 2012-06-20 NOTE — Patient Instructions (Addendum)
I recommend trying  Simply  Saline to lavage your sinuses every night,  Followed by one squirt of Afrin on each side.    If this improves your sleeping and snoring,  Continue it indefinitely.  If it doesn't ,  Stop the Afrin  But continue the saline every night.   Return for fasting blood work.  We are rechecking your [place] fever ab test and your b12 as well   I have called in doxycycline in to [facility].  Take with food.

## 2012-06-20 NOTE — Assessment & Plan Note (Addendum)
previously on lisinopril.  Not sure why it was stopped.  Has atrial fib with slow heart rate.  Amlodipine started by Dr. Mariah Milling.  Occasionally notes ankle edema by the end of the day relieved with elevation of legs.  No changes today.

## 2012-06-21 ENCOUNTER — Other Ambulatory Visit (INDEPENDENT_AMBULATORY_CARE_PROVIDER_SITE_OTHER): Payer: Medicare Other | Admitting: *Deleted

## 2012-06-21 DIAGNOSIS — A938 Other specified arthropod-borne viral fevers: Secondary | ICD-10-CM

## 2012-06-21 DIAGNOSIS — E785 Hyperlipidemia, unspecified: Secondary | ICD-10-CM

## 2012-06-21 DIAGNOSIS — E538 Deficiency of other specified B group vitamins: Secondary | ICD-10-CM

## 2012-06-21 DIAGNOSIS — I10 Essential (primary) hypertension: Secondary | ICD-10-CM

## 2012-06-21 LAB — LIPID PANEL
Cholesterol: 182 mg/dL (ref 0–200)
HDL: 63.2 mg/dL
LDL Cholesterol: 99 mg/dL (ref 0–99)
Total CHOL/HDL Ratio: 3
Triglycerides: 97 mg/dL (ref 0.0–149.0)
VLDL: 19.4 mg/dL (ref 0.0–40.0)

## 2012-06-21 LAB — VITAMIN B12: Vitamin B-12: 290 pg/mL (ref 211–911)

## 2012-06-22 ENCOUNTER — Encounter: Payer: Self-pay | Admitting: Internal Medicine

## 2012-06-22 DIAGNOSIS — R5383 Other fatigue: Secondary | ICD-10-CM | POA: Insufficient documentation

## 2012-06-22 DIAGNOSIS — Z8619 Personal history of other infectious and parasitic diseases: Secondary | ICD-10-CM | POA: Insufficient documentation

## 2012-06-22 LAB — COMPLETE METABOLIC PANEL WITH GFR
ALT: 14 U/L (ref 0–35)
AST: 16 U/L (ref 0–37)
Albumin: 3.9 g/dL (ref 3.5–5.2)
Alkaline Phosphatase: 51 U/L (ref 39–117)
BUN: 19 mg/dL (ref 6–23)
CO2: 27 mEq/L (ref 19–32)
Calcium: 9.1 mg/dL (ref 8.4–10.5)
Chloride: 105 mEq/L (ref 96–112)
Creat: 0.94 mg/dL (ref 0.50–1.10)
GFR, Est African American: 69 mL/min
GFR, Est Non African American: 60 mL/min
Glucose, Bld: 98 mg/dL (ref 70–99)
Potassium: 4.4 mEq/L (ref 3.5–5.3)
Sodium: 141 mEq/L (ref 135–145)
Total Bilirubin: 0.8 mg/dL (ref 0.3–1.2)
Total Protein: 6.4 g/dL (ref 6.0–8.3)

## 2012-06-22 LAB — ROCKY MTN SPOTTED FVR AB, IGM-BLOOD: ROCKY MTN SPOTTED FEVER, IGM: 1.01 IV — ABNORMAL HIGH

## 2012-06-22 NOTE — Assessment & Plan Note (Signed)
Managed by Dossie Arbour with amiodarone. Thromboembolic risk managed with xarelto.

## 2012-06-22 NOTE — Assessment & Plan Note (Addendum)
Likely multifactorial,  Aggravated by snoring and poor sleep quality. Discussed trial of afrin and saline sinus lavage at  Bedtime to manage congestion which may be contributing.  Given recent tick bite will treat empirically for RMSF.

## 2012-06-27 ENCOUNTER — Ambulatory Visit: Payer: Self-pay | Admitting: Internal Medicine

## 2012-06-28 ENCOUNTER — Telehealth: Payer: Self-pay | Admitting: Internal Medicine

## 2012-06-28 MED ORDER — FUROSEMIDE 20 MG PO TABS: 20.0000 mg | ORAL_TABLET | Freq: Every day | ORAL | Status: DC

## 2012-06-28 NOTE — Telephone Encounter (Signed)
Her chest ray, which was done because of cough,  Shows hyperinflated lungs, and possibly a component of mild chf. i would like  to add a diuretic in the morning,  Lasix 20 mg daily and see if the cough resolves.  If not,  I will refer her for pulmonary  Function tests.  Will send rx via [facility]

## 2012-06-29 NOTE — Telephone Encounter (Signed)
Patient notified.  She has an appt tomorrow for follow up.

## 2012-06-30 ENCOUNTER — Ambulatory Visit (INDEPENDENT_AMBULATORY_CARE_PROVIDER_SITE_OTHER): Payer: Medicare Other | Admitting: Internal Medicine

## 2012-06-30 ENCOUNTER — Encounter: Payer: Self-pay | Admitting: Internal Medicine

## 2012-06-30 VITALS — BP 124/62 | HR 58 | Temp 98.3°F | Resp 16 | Wt 169.0 lb

## 2012-06-30 DIAGNOSIS — R5381 Other malaise: Secondary | ICD-10-CM

## 2012-06-30 DIAGNOSIS — I10 Essential (primary) hypertension: Secondary | ICD-10-CM

## 2012-06-30 DIAGNOSIS — E669 Obesity, unspecified: Secondary | ICD-10-CM

## 2012-06-30 DIAGNOSIS — R5383 Other fatigue: Secondary | ICD-10-CM

## 2012-06-30 DIAGNOSIS — Z79899 Other long term (current) drug therapy: Secondary | ICD-10-CM

## 2012-06-30 DIAGNOSIS — Z8619 Personal history of other infectious and parasitic diseases: Secondary | ICD-10-CM

## 2012-06-30 DIAGNOSIS — R0602 Shortness of breath: Secondary | ICD-10-CM

## 2012-06-30 NOTE — Assessment & Plan Note (Signed)
I have addressed  BMI and recommended a low glycemic index diet utilizing smaller more frequent meals to increase metabolism.  I have also recommended that patient start exercising with a goal of 30 minutes of aerobic exercise a minimum of 5 days per week.  

## 2012-06-30 NOTE — Assessment & Plan Note (Signed)
Likely multifactorial,  But adding B supplements given low normal level.

## 2012-06-30 NOTE — Assessment & Plan Note (Signed)
Mild, with persistent nonproductive cough and suggestion of alveolar edema on CXR and ankle edema ,  Prn furosemide.

## 2012-06-30 NOTE — Progress Notes (Addendum)
Patient ID: [name] [name], female   DOB: 1936-06-28, 76 y.o.   MRN: [id number]   Patient Active Problem List  Diagnosis  . HYPERLIPIDEMIA-MIXED  . HYPERTENSION  . HEADACHE, CHRONIC  . DYSPNEA  . TACHYCARDIA  . Persistent atrial fibrillation  . Anxiety and depression  . Fatigue  . History of [facility] spotted fever  . Obesity    Subjective:  CC:   Chief Complaint  Patient presents with  . Follow-up    chest x ray    HPI:   [name] [name] a 76 y.o. female who presents  For fFollow up on abnormal cxr, positive RMSF IgM titre, both ordered due to symptoms of cough and headache/fatigue.  She was treated empirically with doxycycline with minimal improvement inf symptoms.  Her b12 borderline low so she was advised to start supplementation and will start sublingual 2500 mcg. She does note some ankle edema by the end of the day   Past Medical History  Diagnosis Date  . Atrial fibrillation     Paroxysmal, hx of  . Cardiomyopathy   . Dyspnea   . Headache     chronic  . Hypertension   . Pre-syncope   . Chronic kidney disease     acute renal failure secondary to dehydration which is now resolved  . Light headedness     due to dehydration  . Atrial flutter 02/2011    s/p cardioversion   . Hyperlipidemia     Past Surgical History  Procedure Date  . Cardioversion     x 3  . Cholecystectomy   . Appendectomy   . Multiple orthopedic procedures   . Mastectomy 1986    Bilateral with silicone  breast implants, s/p saline replacements  . Cardiac catheterization   . Combined augmentation mammaplasty and abdominoplasty   . Abdominal hysterectomy 1990         The following portions of the patient's history were reviewed and updated as appropriate: Allergies, current medications, and problem list.    Review of Systems:   12 Pt  review of systems was negative except those addressed in the HPI,     History   Social History  . Marital Status: Married   Spouse Name: N/A    Number of Children: N/A  . Years of Education: N/A   Occupational History  . Not on file.   Social History Main Topics  . Smoking status: Never Smoker   . Smokeless tobacco: Never Used  . Alcohol Use: No  . Drug Use: No  . Sexually Active: Not on file   Other Topics Concern  . Not on file   Social History Narrative  . No narrative on file    Objective:  BP 124/62  Pulse 58  Temp 98.3 F (36.8 C) (Oral)  Resp 16  Wt 169 lb (76.658 kg)  SpO2 93%  General appearance: alert, cooperative and appears stated age Ears: normal TM's and external ear canals both ears Throat: lips, mucosa, and tongue normal; teeth and gums normal Neck: no adenopathy, no carotid bruit, supple, symmetrical, trachea midline and thyroid not enlarged, symmetric, no tenderness/mass/nodules Back: symmetric, no curvature. ROM normal. No CVA tenderness. Lungs: clear to auscultation bilaterally Heart: regular rate and rhythm, S1, S2 normal, no murmur, click, rub or gallop Abdomen: soft, non-tender; bowel sounds normal; no masses,  no organomegaly Pulses: 2+ and symmetric Skin: Skin color, texture, turgor normal. No rashes or lesions Lymph nodes: Cervical, supraclavicular, and axillary nodes normal.  Assessment and Plan:  Obesity I have addressed  BMI and recommended a low glycemic index diet utilizing smaller more frequent meals to increase metabolism.  I have also recommended that patient start exercising with a goal of 30 minutes of aerobic exercise a minimum of 5 days per week.    DYSPNEA Mild, with persistent nonproductive cough and suggestion of alveolar edema on CXR and ankle edema ,  Prn furosemide.   History of [facility] spotted fever Last year ,.  She was treated again empirically due to symptoms of headache and recent tick bit with IgM levels equivocal by repeat testing.   HYPERTENSION Well controlled on current medications.  No changes today.   Updated  Medication List Outpatient Encounter Prescriptions as of 06/30/2012  Medication Sig Dispense Refill  . amiodarone (PACERONE) 200 MG tablet Take 1 tablet (200 mg total) by mouth daily.  30 tablet  8  . amLODipine (NORVASC) 5 MG tablet Take 1 tablet (5 mg total) by mouth daily.  30 tablet  11  . furosemide (LASIX) 20 MG tablet Take 1 tablet (20 mg total) by mouth daily.  30 tablet  3  . Omega-3 Fatty Acids (FISH OIL) 1200 MG CAPS Take 1,200 mg by mouth daily.      [name] omeprazole (PRILOSEC) 20 MG capsule Take 20 mg by mouth 2 (two) times daily.       . Red Yeast Rice Extract (RED YEAST RICE PO) Take 1,200 mg by mouth daily.      . Rivaroxaban (XARELTO) 20 MG TABS Take 20 mg by mouth daily.  30 tablet  3  . traMADol (ULTRAM) 50 MG tablet Take 50 mg by mouth as needed. For pain       . DISCONTD: doxycycline (VIBRA-TABS) 100 MG tablet Take 1 tablet (100 mg total) by mouth 2 (two) times daily.  14 tablet  0     Orders Placed This Encounter  Procedures  . Basic metabolic panel    Return in about 6 months (around 12/31/2012).

## 2012-06-30 NOTE — Assessment & Plan Note (Signed)
Last year ,.  She was treated again empirically due to symptoms of headache and recent tick bit with IgM levels equivocal by repeat testing.

## 2012-06-30 NOTE — Patient Instructions (Signed)
You can take your B12 supplement under the tongue  minimum of 3 days weekly, but it is ok to take daily  Starting a low dose diuretic,  furosemide to treat your cough and fluid retention  May take as needed, once daily in the morning,   But if you find that the cough resolves and then returns when you stop it ,  Continue it and return for a potassium check.

## 2012-06-30 NOTE — Assessment & Plan Note (Signed)
Well controlled on current medications.  No changes today. 

## 2012-07-03 ENCOUNTER — Encounter: Payer: Self-pay | Admitting: Cardiovascular Disease

## 2012-07-03 ENCOUNTER — Telehealth: Payer: Self-pay

## 2012-07-03 ENCOUNTER — Ambulatory Visit (INDEPENDENT_AMBULATORY_CARE_PROVIDER_SITE_OTHER): Payer: Medicare Other | Admitting: Cardiovascular Disease

## 2012-07-03 VITALS — BP 122/64 | HR 56 | Ht 62.5 in | Wt 165.0 lb

## 2012-07-03 DIAGNOSIS — I4819 Other persistent atrial fibrillation: Secondary | ICD-10-CM

## 2012-07-03 DIAGNOSIS — R0602 Shortness of breath: Secondary | ICD-10-CM

## 2012-07-03 DIAGNOSIS — I4891 Unspecified atrial fibrillation: Secondary | ICD-10-CM

## 2012-07-03 DIAGNOSIS — I10 Essential (primary) hypertension: Secondary | ICD-10-CM

## 2012-07-03 NOTE — Assessment & Plan Note (Signed)
Her blood pressure is well controlled. 

## 2012-07-03 NOTE — Telephone Encounter (Signed)
Pt called with c/o chest tightness and sob.  She has been seeing Dr. Darrick Huntsman for tx of [facility] Spotted Fever and was given doxycycline for this.  Since then she has developed a nagging dry cough that warranted a CXR by Dr. Darrick Huntsman.  She was told she has some "fluid on her lungs" and was started on lasix 20 mg PRN Friday 06/30/12. She has taken this over w/e and has began to have chest tightness associated with SOB.  She says cough has improved only slightly. She is concerned and feels she needs to be seen by Dr. Kirke Corin.  Worked her in at 1045 this am.

## 2012-07-03 NOTE — Progress Notes (Signed)
HPI  [name] [name] is a 76 year old female who was added to my schedule due to concerns about dyspnea and lower extremity edema. She has known history of atrial fibrillation/flutter status post multiple cardioversions in the past. Most recent echocardiogram was in April of this year which showed low normal LV systolic function with ejection fraction of 50-55%. 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 She had cardiac catheterization twice in the past without significant coronary artery disease. She had a tick bite recently and was treated with doxycycline. When she took the medication, she had some indigestion and substernal discomfort. The symptoms improved after she finished taking the medication. She noticed increased dyspnea and lower extremity edema. She saw Dr. Darrick Huntsman who performed a chest x-ray which showed mild pulmonary vascular congestion. She was started on furosemide 20 mg once daily. Her symptoms are slightly better.  Allergies  Allergen Reactions  . Codeine   . Iodine   . Warfarin Sodium      Current Outpatient Prescriptions on File Prior to Visit  Medication Sig Dispense Refill  . amiodarone (PACERONE) 200 MG tablet Take 1 tablet (200 mg total) by mouth daily.  30 tablet  8  . amLODipine (NORVASC) 5 MG tablet Take 1 tablet (5 mg total) by mouth daily.  30 tablet  11  . furosemide (LASIX) 20 MG tablet Take 1 tablet (20 mg total) by mouth daily.  30 tablet  3  . Omega-3 Fatty Acids (FISH OIL) 1200 MG CAPS Take 1,200 mg by mouth daily.      [name] omeprazole (PRILOSEC) 20 MG capsule Take 20 mg by mouth 2 (two) times daily.       . Red Yeast Rice Extract (RED YEAST RICE PO) Take 1,200 mg by mouth daily.      . Rivaroxaban (XARELTO) 20 MG TABS Take 20 mg by mouth daily.  30 tablet  3  . traMADol (ULTRAM) 50 MG tablet Take 50 mg by mouth as needed. For pain          Past Medical History  Diagnosis Date  . Atrial fibrillation     Paroxysmal, hx  of  . Cardiomyopathy   . Dyspnea   . Headache     chronic  . Hypertension   . Pre-syncope   . Chronic kidney disease     acute renal failure secondary to dehydration which is now resolved  . Light headedness     due to dehydration  . Atrial flutter 02/2011    s/p cardioversion   . Hyperlipidemia      Past Surgical History  Procedure Date  . Cardioversion     x 3  . Cholecystectomy   . Appendectomy   . Multiple orthopedic procedures   . Mastectomy 1986    Bilateral with silicone  breast implants, s/p saline replacements  . Cardiac catheterization   . Combined augmentation mammaplasty and abdominoplasty   . Abdominal hysterectomy 1990     Family History  Problem Relation Age of Onset  . Heart disease Mother   . Cancer Father     stomach  . Cancer Sister     breast  . Cancer Maternal Grandmother     breast  . Cancer Sister     breast  . Diabetes Neg Hx   . Heart disease Son     found at autopsy     History   Social History  . Marital Status: Married    Spouse Name:  N/A    Number of Children: N/A  . Years of Education: N/A   Occupational History  . Not on file.   Social History Main Topics  . Smoking status: Never Smoker   . Smokeless tobacco: Never Used  . Alcohol Use: No  . Drug Use: No  . Sexually Active: Not on file   Other Topics Concern  . Not on file   Social History Narrative  . No narrative on file      PHYSICAL EXAM   BP 122/64  Pulse 56  Ht 5' 2.5" (1.588 m)  Wt 165 lb (74.844 kg)  BMI 29.70 kg/m2  SpO2 97%  Constitutional: She is oriented to person, place, and time. She appears well-developed and well-nourished. No distress.  HENT: No nasal discharge.  Head: Normocephalic and atraumatic.  Eyes: Pupils are equal and round. Right eye exhibits no discharge. Left eye exhibits no discharge.  Neck: Normal range of motion. Neck supple. No JVD present. No thyromegaly present.  Cardiovascular: Normal rate, regular rhythm, normal  heart sounds. Exam reveals no gallop and no friction rub. No murmur heard.  Pulmonary/Chest: Effort normal and breath sounds normal. No stridor. No respiratory distress. She has no wheezes. She has no rales. She exhibits no tenderness.  Abdominal: Soft. Bowel sounds are normal. She exhibits no distension. There is no tenderness. There is no rebound and no guarding.  Musculoskeletal: Normal range of motion. She exhibits +1 edema and no tenderness.  Neurological: She is alert and oriented to person, place, and time. Coordination normal.  Skin: Skin is warm and dry. No rash noted. She is not diaphoretic. No erythema. No pallor.  Psychiatric: She has a normal mood and affect. Her behavior is normal. Judgment and thought content normal.      ASSESSMENT AND PLAN

## 2012-07-03 NOTE — Patient Instructions (Addendum)
Continue Lasix 20 mg once daily.  Let me know if shortness of breath and swelling do not get better in few weeks.

## 2012-07-03 NOTE — Assessment & Plan Note (Signed)
She is currently in normal sinus rhythm by physical exam. Heart rate and blood pressure on optimal. Continue current management.

## 2012-07-03 NOTE — Assessment & Plan Note (Signed)
Possible component of mild diastolic heart failure. Her symptoms seems to be gradually improving with the addition of small dose Lasix 20 mg once daily. I asked her to continue taking this medication. I don't see frank evidence of cardiac decompensation at this time and she was reassured. She will let me know if her symptoms do not improve in few weeks.

## 2012-07-11 ENCOUNTER — Encounter: Payer: Self-pay | Admitting: Internal Medicine

## 2012-07-26 ENCOUNTER — Other Ambulatory Visit: Payer: Self-pay | Admitting: *Deleted

## 2012-07-26 DIAGNOSIS — R0602 Shortness of breath: Secondary | ICD-10-CM

## 2012-07-26 DIAGNOSIS — I4892 Unspecified atrial flutter: Secondary | ICD-10-CM

## 2012-07-26 MED ORDER — RIVAROXABAN 20 MG PO TABS: 20.0000 mg | ORAL_TABLET | Freq: Every day | ORAL | Status: DC

## 2012-07-26 NOTE — Telephone Encounter (Signed)
Refilled Xarelto. 

## 2012-09-01 ENCOUNTER — Ambulatory Visit (INDEPENDENT_AMBULATORY_CARE_PROVIDER_SITE_OTHER): Payer: Medicare Other | Admitting: Internal Medicine

## 2012-09-01 ENCOUNTER — Encounter: Payer: Self-pay | Admitting: Internal Medicine

## 2012-09-01 VITALS — BP 130/70 | HR 59 | Temp 98.0°F | Ht 62.5 in | Wt 166.5 lb

## 2012-09-01 DIAGNOSIS — R233 Spontaneous ecchymoses: Secondary | ICD-10-CM

## 2012-09-01 LAB — COMPREHENSIVE METABOLIC PANEL
ALT: 23 U/L (ref 0–35)
AST: 22 U/L (ref 0–37)
Albumin: 4 g/dL (ref 3.5–5.2)
Alkaline Phosphatase: 52 U/L (ref 39–117)
BUN: 18 mg/dL (ref 6–23)
CO2: 29 mEq/L (ref 19–32)
Calcium: 9.2 mg/dL (ref 8.4–10.5)
Chloride: 103 mEq/L (ref 96–112)
Creatinine, Ser: 1.1 mg/dL (ref 0.4–1.2)
GFR: 50.98 mL/min — ABNORMAL LOW (ref >60.00)
Glucose, Bld: 103 mg/dL — ABNORMAL HIGH (ref 70–99)
Potassium: 4.3 mEq/L (ref 3.5–5.1)
Sodium: 138 mEq/L (ref 135–145)
Total Bilirubin: 0.7 mg/dL (ref 0.3–1.2)
Total Protein: 7.2 g/dL (ref 6.0–8.3)

## 2012-09-01 LAB — CBC WITH DIFFERENTIAL/PLATELET
Basophils Absolute: 0 10*3/uL (ref 0.0–0.1)
Basophils Relative: 0.5 % (ref 0.0–3.0)
Eosinophils Absolute: 0.1 10*3/uL (ref 0.0–0.7)
Eosinophils Relative: 1.4 % (ref 0.0–5.0)
HCT: 41.7 % (ref 36.0–46.0)
Hemoglobin: 13.6 g/dL (ref 12.0–15.0)
Lymphocytes Relative: 18.3 % (ref 12.0–46.0)
Lymphs Abs: 1.1 10*3/uL (ref 0.7–4.0)
MCHC: 32.6 g/dL (ref 30.0–36.0)
MCV: 97.5 fl (ref 78.0–100.0)
Monocytes Absolute: 0.4 10*3/uL (ref 0.1–1.0)
Monocytes Relative: 6.5 % (ref 3.0–12.0)
Neutro Abs: 4.3 10*3/uL (ref 1.4–7.7)
Neutrophils Relative %: 73.3 % (ref 43.0–77.0)
Platelets: 202 10*3/uL (ref 150.0–400.0)
RBC: 4.28 Mil/uL (ref 3.87–5.11)
RDW: 14.1 % (ref 11.5–14.6)
WBC: 5.8 10*3/uL (ref 4.5–10.5)

## 2012-09-01 LAB — APTT: aPTT: 47.7 s — ABNORMAL HIGH (ref 21.7–28.8)

## 2012-09-01 LAB — PROTIME-INR
INR: 1.3 ratio — ABNORMAL HIGH (ref 0.8–1.0)
Prothrombin Time: 14.3 s — ABNORMAL HIGH (ref 10.2–12.4)

## 2012-09-01 NOTE — Assessment & Plan Note (Signed)
Ecchymosis noted on right arm. No known trauma to this area. Ecchymosis likely secondary to patient's use of Xarelto. Will check CBC, CMP, PTT, and PT to be sure no other risk factors for spontaneous bleeding. She will call if symptoms are not improving.

## 2012-09-01 NOTE — Progress Notes (Signed)
  Subjective:    Patient ID: [name] [name], female    DOB: Sep 13, 1936, 76 y.o.   MRN: [id number]  HPI 76 year old female with history of atrial fibrillation on anticoagulation with Xarelto presents for acute visit complaining of a bruise on her right arm. She reports that she first noticed the bruise yesterday. It extends from her right axilla down to her left forearm. She denies any known trauma to her arm. She notes she has been physically active, mowing her lawn and taking care of her husband. The area is slightly tender to touch over her right medial forearm. She denies any fever or chills. She denies any other bleeding such as bleeding from her nose, mouth, in her urine or stool.  Outpatient Encounter Prescriptions as of 09/01/2012  Medication Sig Dispense Refill  . amiodarone (PACERONE) 200 MG tablet Take 1 tablet (200 mg total) by mouth daily.  30 tablet  8  . amLODipine (NORVASC) 5 MG tablet Take 1 tablet (5 mg total) by mouth daily.  30 tablet  11  . Cyanocobalamin (B-12) 2500 MCG TABS Take by mouth daily.      . furosemide (LASIX) 20 MG tablet Take 1 tablet (20 mg total) by mouth daily.  30 tablet  3  . Omega-3 Fatty Acids (FISH OIL) 1200 MG CAPS Take 1,200 mg by mouth daily.      [name] omeprazole (PRILOSEC) 20 MG capsule Take 20 mg by mouth 2 (two) times daily.       . Red Yeast Rice Extract (RED YEAST RICE PO) Take 1,200 mg by mouth daily.      . Rivaroxaban (XARELTO) 20 MG TABS Take 1 tablet (20 mg total) by mouth daily.  30 tablet  5  . traMADol (ULTRAM) 50 MG tablet Take 50 mg by mouth as needed. For pain        BP 130/70  Pulse 59  Temp 98 F (36.7 C) (Oral)  Ht 5' 2.5" (1.588 m)  Wt 166 lb 8 oz (75.524 kg)  BMI 29.97 kg/m2  SpO2 97%  Review of Systems  Constitutional: Negative for fever, chills and fatigue.  Respiratory: Negative for cough and shortness of breath.   Cardiovascular: Negative for chest pain and palpitations.  Musculoskeletal: Positive for joint swelling.  Negative for myalgias and arthralgias.  Hematological: Negative for adenopathy. Bruises/bleeds easily.       Objective:   Physical Exam  Constitutional: She is oriented to person, place, and time. She appears well-developed and well-nourished. No distress.  HENT:  Head: Normocephalic and atraumatic.  Eyes: Pupils are equal, round, and reactive to light.  Neck: Normal range of motion. Neck supple.  Pulmonary/Chest: Effort normal.  Musculoskeletal: Normal range of motion.  Neurological: She is alert and oriented to person, place, and time.  Skin: Ecchymosis noted. She is not diaphoretic.             Assessment & Plan:

## 2012-09-04 ENCOUNTER — Telehealth: Payer: Self-pay | Admitting: Cardiovascular Disease

## 2012-09-04 NOTE — Telephone Encounter (Signed)
Pt calling and wants to discuss her xarelto and a bruise she has.

## 2012-09-04 NOTE — Telephone Encounter (Signed)
Pt says she woke up Thursday am with large hematoma under arm. She went to PCP about this on Friday. Labs were drawn. She was called today by PCP and was told all labs look normal except aPTT. Pt wants Dr. Kirke Corin to review to see if this is an abnormal finding in a pt who is taking Xarelto. I had her hold while I discussed with him. He asks that I reassure pt an elevated aPTT in a pt taking Xarelto is normal. She should hold Xarelto x 1-2 days if hematoma not resolving.  Otherwise all labs look normal. Pt informed. Understanding verb. She says hematoma improving. Will continue to monitor.

## 2012-09-08 ENCOUNTER — Other Ambulatory Visit: Payer: Self-pay | Admitting: Cardiovascular Disease

## 2012-09-08 MED ORDER — AMIODARONE HCL 200 MG PO TABS: 200.0000 mg | ORAL_TABLET | Freq: Every day | ORAL | Status: DC

## 2012-09-11 ENCOUNTER — Ambulatory Visit (INDEPENDENT_AMBULATORY_CARE_PROVIDER_SITE_OTHER): Payer: Medicare Other | Admitting: Cardiovascular Disease

## 2012-09-11 ENCOUNTER — Encounter: Payer: Self-pay | Admitting: Cardiovascular Disease

## 2012-09-11 VITALS — BP 124/70 | HR 52 | Ht 64.0 in | Wt 165.8 lb

## 2012-09-11 DIAGNOSIS — I4891 Unspecified atrial fibrillation: Secondary | ICD-10-CM

## 2012-09-11 DIAGNOSIS — R233 Spontaneous ecchymoses: Secondary | ICD-10-CM

## 2012-09-11 DIAGNOSIS — I10 Essential (primary) hypertension: Secondary | ICD-10-CM

## 2012-09-11 DIAGNOSIS — I4819 Other persistent atrial fibrillation: Secondary | ICD-10-CM

## 2012-09-11 NOTE — Assessment & Plan Note (Signed)
She is maintaining in sinus rhythm with amiodarone.  Thyroid function and LFTs have been normal this year. These will be checked at least once a year given that she is on amiodarone. Given that she has been on amiodarone for a few years, I will also consider yearly pulmonary function testing with diffusion capacity.  Lab Results  Component Value Date   TSH 1.570 03/24/2012   Lab Results  Component Value Date   ALT 23 09/01/2012   AST 22 09/01/2012   ALKPHOS 52 09/01/2012   BILITOT 0.7 09/01/2012

## 2012-09-11 NOTE — Assessment & Plan Note (Signed)
This seems to be due to the effect of Xarelto. Recent labs were unremarkable. If this becomes a recurrent issue, I might have to consider decreasing the dose to 15 mg once daily. However, given that her kidney function is normal, this will likely decrease the efficacy of anticoagulation

## 2012-09-11 NOTE — Progress Notes (Signed)
HPI  [name] [name] is a 76 year old female who is here today for a followup visit.  She has known history of atrial fibrillation/flutter status post multiple cardioversions in the past. Most recent echocardiogram was in April of this year which showed low normal LV systolic function with ejection fraction of 50-55%. 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 She had cardiac catheterization twice in the past without significant coronary artery disease.  She has been on Xarelto for anticoagulation. Recently, she had an extensive ecchymosis in the right upper extremity without any noted trauma. This gradually improved. She had routine labs done showed normal CBC and basic metabolic profile. Her PT and PTT were elevated but this is an expected effect of Xarelto. Overall, she has been doing reasonably well. She denies any palpitations. Dyspnea is stable. She has minimal lower extremity edema.  Allergies  Allergen Reactions  . Codeine   . Iodine   . Warfarin Sodium      Current Outpatient Prescriptions on File Prior to Visit  Medication Sig Dispense Refill  . amiodarone (PACERONE) 200 MG tablet Take 1 tablet (200 mg total) by mouth daily.  30 tablet  8  . amLODipine (NORVASC) 5 MG tablet Take 1 tablet (5 mg total) by mouth daily.  30 tablet  11  . Cyanocobalamin (B-12) 2500 MCG TABS Take by mouth daily.      . furosemide (LASIX) 20 MG tablet Take 1 tablet (20 mg total) by mouth daily.  30 tablet  3  . omeprazole (PRILOSEC) 20 MG capsule Take 20 mg by mouth 2 (two) times daily.       . Red Yeast Rice Extract (RED YEAST RICE PO) Take 1,200 mg by mouth daily.      . Rivaroxaban (XARELTO) 20 MG TABS Take 1 tablet (20 mg total) by mouth daily.  30 tablet  5  . traMADol (ULTRAM) 50 MG tablet Take 50 mg by mouth as needed. For pain          Past Medical History  Diagnosis Date  . Atrial fibrillation     Paroxysmal, hx of  . Cardiomyopathy   . Dyspnea   .  Headache     chronic  . Hypertension   . Pre-syncope   . Chronic kidney disease     acute renal failure secondary to dehydration which is now resolved  . Light headedness     due to dehydration  . Atrial flutter 02/2011    s/p cardioversion   . Hyperlipidemia      Past Surgical History  Procedure Date  . Cardioversion     x 3  . Cholecystectomy   . Appendectomy   . Multiple orthopedic procedures   . Mastectomy 1986    Bilateral with silicone  breast implants, s/p saline replacements  . Cardiac catheterization   . Combined augmentation mammaplasty and abdominoplasty   . Abdominal hysterectomy 1990     Family History  Problem Relation Age of Onset  . Heart disease Mother   . Cancer Father     stomach  . Cancer Sister     breast  . Cancer Maternal Grandmother     breast  . Cancer Sister     breast  . Diabetes Neg Hx   . Heart disease Son     found at autopsy     History   Social History  . Marital Status: Married    Spouse Name: N/A    Number  of Children: N/A  . Years of Education: N/A   Occupational History  . Not on file.   Social History Main Topics  . Smoking status: Never Smoker   . Smokeless tobacco: Never Used  . Alcohol Use: No  . Drug Use: No  . Sexually Active: Not on file   Other Topics Concern  . Not on file   Social History Narrative  . No narrative on file     PHYSICAL EXAM   BP 124/70  Pulse 52  Ht 5\' 4"  (1.626 m)  Wt 165 lb 12 oz (75.184 kg)  BMI 28.45 kg/m2 Constitutional: She is oriented to person, place, and time. She appears well-developed and well-nourished. No distress.  HENT: No nasal discharge.  Head: Normocephalic and atraumatic.  Eyes: Pupils are equal and round. Right eye exhibits no discharge. Left eye exhibits no discharge.  Neck: Normal range of motion. Neck supple. No JVD present. No thyromegaly present.  Cardiovascular: Normal rate, regular rhythm, normal heart sounds. Exam reveals no gallop and no  friction rub. No murmur heard.  Pulmonary/Chest: Effort normal and breath sounds normal. No stridor. No respiratory distress. She has no wheezes. She has no rales. She exhibits no tenderness.  Abdominal: Soft. Bowel sounds are normal. She exhibits no distension. There is no tenderness. There is no rebound and no guarding.  Musculoskeletal: Normal range of motion. She exhibits trace edema and no tenderness.  Neurological: She is alert and oriented to person, place, and time. Coordination normal.  Skin: Skin is warm and dry. No rash noted. She is not diaphoretic. No erythema. No pallor.  Psychiatric: She has a normal mood and affect. Her behavior is normal. Judgment and thought content normal.     EKG: Sinus  Bradycardia  -RSR(V1) -nondiagnostic.   -Nonspecific ST depression   +   Diffuse nonspecific T-abnormality  -Nondiagnostic.  Normal QTC. ABNORMAL    ASSESSMENT AND PLAN

## 2012-09-11 NOTE — Patient Instructions (Addendum)
Continue same medications.  Follow up in 6 months.  

## 2012-09-20 ENCOUNTER — Ambulatory Visit: Payer: Medicare Other | Admitting: Internal Medicine

## 2012-10-03 ENCOUNTER — Encounter: Payer: Self-pay | Admitting: Internal Medicine

## 2012-10-03 ENCOUNTER — Ambulatory Visit (INDEPENDENT_AMBULATORY_CARE_PROVIDER_SITE_OTHER): Payer: Medicare Other | Admitting: Internal Medicine

## 2012-10-03 ENCOUNTER — Telehealth: Payer: Self-pay | Admitting: Cardiovascular Disease

## 2012-10-03 VITALS — BP 130/72 | HR 56 | Temp 97.5°F | Ht 63.5 in | Wt 167.8 lb

## 2012-10-03 DIAGNOSIS — R0602 Shortness of breath: Secondary | ICD-10-CM

## 2012-10-03 DIAGNOSIS — R1031 Right lower quadrant pain: Secondary | ICD-10-CM

## 2012-10-03 DIAGNOSIS — R5381 Other malaise: Secondary | ICD-10-CM

## 2012-10-03 DIAGNOSIS — M25569 Pain in unspecified knee: Secondary | ICD-10-CM

## 2012-10-03 DIAGNOSIS — M25561 Pain in right knee: Secondary | ICD-10-CM

## 2012-10-03 DIAGNOSIS — Z79899 Other long term (current) drug therapy: Secondary | ICD-10-CM

## 2012-10-03 DIAGNOSIS — R5383 Other fatigue: Secondary | ICD-10-CM

## 2012-10-03 DIAGNOSIS — E538 Deficiency of other specified B group vitamins: Secondary | ICD-10-CM

## 2012-10-03 DIAGNOSIS — R109 Unspecified abdominal pain: Secondary | ICD-10-CM

## 2012-10-03 DIAGNOSIS — Z1239 Encounter for other screening for malignant neoplasm of breast: Secondary | ICD-10-CM

## 2012-10-03 DIAGNOSIS — I10 Essential (primary) hypertension: Secondary | ICD-10-CM

## 2012-10-03 DIAGNOSIS — R350 Frequency of micturition: Secondary | ICD-10-CM

## 2012-10-03 MED ORDER — CETIRIZINE HCL 10 MG PO TABS: 10.0000 mg | ORAL_TABLET | Freq: Every day | ORAL | Status: DC

## 2012-10-03 NOTE — Patient Instructions (Addendum)
Try using one squirt of Afrin each nostril before bed to help snoring.    If your urine is abnormal we will treat you for a urine infection

## 2012-10-03 NOTE — Progress Notes (Signed)
Patient ID: [name] [name], female   DOB: 03/03/1936, 76 y.o.   MRN: [id number]  Patient Active Problem List  Diagnosis  . HYPERLIPIDEMIA-MIXED  . HYPERTENSION  . HEADACHE, CHRONIC  . DYSPNEA  . TACHYCARDIA  . Persistent atrial fibrillation  . Anxiety and depression  . Fatigue  . History of [facility] spotted fever  . Obesity  . Knee pain, bilateral  . Inguinal pain of both sides    Subjective:  CC:   Chief Complaint  Patient presents with  . Follow-up    HPI:   [name] a 76 y.o. female who presents Followup on chronic medical conditions. She reports persistent fatigue which is more pronounced in the mornings but last throughout the day. The fatigue has not improved since she was treated for [facility] spotted fever in July. She has been told that she snores. She has a history of a prior positive sleep study for OSA years ago and was prescribed nocturnal oxygen but has not worn it in years since her more recent sleep study showed improvement but not resolution.. She did not tolerate a CPAP trial in the past.   2) needs diagnostic mamogram annual.  3) severe pain in left knee ,  With prior  arthroscopy and now receiving intrarticular injections.  She is now having recurrent bilateral inguinal pain which she wonders is due to abnormal gait from the knee pain. However she is having more frequent urination and nocturia despite using a daily diuretic. She continues to have bilateral ankle edema.   Past Medical History  Diagnosis Date  . Atrial fibrillation     Paroxysmal, hx of  . Cardiomyopathy   . Dyspnea   . Headache     chronic  . Hypertension   . Pre-syncope   . Chronic kidney disease     acute renal failure secondary to dehydration which is now resolved  . Light headedness     due to dehydration  . Atrial flutter 02/2011    s/p cardioversion   . Hyperlipidemia     Past Surgical History  Procedure Date  . Cardioversion     x 3  . Cholecystectomy     . Appendectomy   . Multiple orthopedic procedures   . Mastectomy 1986    Bilateral with silicone  breast implants, s/p saline replacements  . Cardiac catheterization   . Combined augmentation mammaplasty and abdominoplasty   . Abdominal hysterectomy 1990    The following portions of the patient's history were reviewed and updated as appropriate: Allergies, current medications, and problem list.    Review of Systems:   12 Pt  review of systems was negative except those addressed in the HPI,     History   Social History  . Marital Status: Married    Spouse Name: N/A    Number of Children: N/A  . Years of Education: N/A   Occupational History  . Not on file.   Social History Main Topics  . Smoking status: Never Smoker   . Smokeless tobacco: Never Used  . Alcohol Use: No  . Drug Use: No  . Sexually Active: Not on file   Other Topics Concern  . Not on file   Social History Narrative  . No narrative on file    Objective:  BP 130/72  Pulse 56  Temp 97.5 F (36.4 C) (Oral)  Ht 5' 3.5" (1.613 m)  Wt 167 lb 12 oz (76.091 kg)  BMI 29.25 kg/m2  SpO2 96%  General appearance: alert, cooperative and appears stated age Ears: normal TM's and external ear canals both ears Throat: lips, mucosa, and tongue normal; teeth and gums normal Neck: no adenopathy, no carotid bruit, supple, symmetrical, trachea midline and thyroid not enlarged, symmetric, no tenderness/mass/nodules Back: symmetric, no curvature. ROM normal. No CVA tenderness. Lungs: clear to auscultation bilaterally Heart: regular rate and rhythm, S1, S2 normal, no murmur, click, rub or gallop Abdomen: soft, non-tender; bowel sounds normal; no masses,  no organomegaly Pulses: 2+ and symmetric Skin: Skin color, texture, turgor normal. No rashes or lesions Lymph nodes: Cervical, supraclavicular, and axillary nodes normal.  Assessment and Plan:  Fatigue Ideology unclear. She has no history of renal failure or  anemia by labs done last month. Her continued symptoms may be due to worsening condition of sleep apnea or bradycardia due to her medications used to control her atrial fibrillation.. I recommended that she consider having a repeat sleep study.   HYPERTENSION well controlled   DYSPNEA Improved with small dose of daily Lasix. However her persistent fatigue may warrant repeat echocardiogram for evaluation of ejection fraction. She will followup with Dr. Kirke Corin.  Knee pain, bilateral Secondary to degenerative joint disease. She's had prior arthroscopies. She is deferring total knee replacement but is considering having this done given the upcoming changes in health care    Inguinal pain of both sides Her exam is normal. Her symptoms may be coming from deconditioning due to bilateral knee pain which is causing her walk differently. I have ordered a urinalysis to rule out urinary infection but the results have not been obtained yet. We'll followup on this today.   Updated Medication List Outpatient Encounter Prescriptions as of 10/03/2012  Medication Sig Dispense Refill  . amiodarone (PACERONE) 200 MG tablet Take 1 tablet (200 mg total) by mouth daily.  30 tablet  8  . amLODipine (NORVASC) 5 MG tablet Take 1 tablet (5 mg total) by mouth daily.  30 tablet  11  . Cyanocobalamin (B-12) 2500 MCG TABS Take by mouth daily.      . furosemide (LASIX) 20 MG tablet Take 1 tablet (20 mg total) by mouth daily.  30 tablet  3  . omeprazole (PRILOSEC) 20 MG capsule Take 20 mg by mouth 2 (two) times daily.       . Red Yeast Rice Extract (RED YEAST RICE PO) Take 1,200 mg by mouth daily.      . Rivaroxaban (XARELTO) 20 MG TABS Take 1 tablet (20 mg total) by mouth daily.  30 tablet  5  . traMADol (ULTRAM) 50 MG tablet Take 50 mg by mouth as needed. For pain       . cetirizine (ZYRTEC) 10 MG tablet Take 1 tablet (10 mg total) by mouth daily.  30 tablet  11     Orders Placed This Encounter  Procedures  . MM  Digital Diagnostic Bilat  . Vitamin B12  . Basic metabolic panel  . POCT urinalysis dipstick    No Follow-up on file.

## 2012-10-03 NOTE — Telephone Encounter (Signed)
Pt states she is on blood thinner and has A-Fib.  Pt states she was outside blowing leaves and doing yard work when she may have over exerted herself.  She states her BP was 120/70 and her HR 137.  Pt would like to know what she should do or take.  Please call pt to advise.

## 2012-10-04 ENCOUNTER — Other Ambulatory Visit (INDEPENDENT_AMBULATORY_CARE_PROVIDER_SITE_OTHER): Payer: Medicare Other

## 2012-10-04 ENCOUNTER — Encounter: Payer: Self-pay | Admitting: Internal Medicine

## 2012-10-04 DIAGNOSIS — R5383 Other fatigue: Secondary | ICD-10-CM

## 2012-10-04 DIAGNOSIS — M25561 Pain in right knee: Secondary | ICD-10-CM | POA: Insufficient documentation

## 2012-10-04 DIAGNOSIS — R1031 Right lower quadrant pain: Secondary | ICD-10-CM | POA: Insufficient documentation

## 2012-10-04 DIAGNOSIS — R5381 Other malaise: Secondary | ICD-10-CM

## 2012-10-04 DIAGNOSIS — I10 Essential (primary) hypertension: Secondary | ICD-10-CM

## 2012-10-04 DIAGNOSIS — M25562 Pain in left knee: Secondary | ICD-10-CM | POA: Insufficient documentation

## 2012-10-04 LAB — POCT URINALYSIS DIPSTICK
Blood, UA: NEGATIVE
Glucose, UA: NEGATIVE
Nitrite, UA: NEGATIVE
Protein, UA: NEGATIVE
Spec Grav, UA: 1.025
Urobilinogen, UA: 0.2
pH, UA: 5

## 2012-10-04 LAB — BASIC METABOLIC PANEL
BUN: 23 mg/dL (ref 6–23)
CO2: 28 mEq/L (ref 19–32)
Calcium: 8.8 mg/dL (ref 8.4–10.5)
Chloride: 104 mEq/L (ref 96–112)
Creatinine, Ser: 1.1 mg/dL (ref 0.4–1.2)
GFR: 52.05 mL/min — ABNORMAL LOW (ref >60.00)
Glucose, Bld: 96 mg/dL (ref 70–99)
Potassium: 3.9 mEq/L (ref 3.5–5.1)
Sodium: 139 mEq/L (ref 135–145)

## 2012-10-04 LAB — VITAMIN B12: Vitamin B-12: >1500 pg/mL — ABNORMAL HIGH (ref 211–911)

## 2012-10-04 NOTE — Telephone Encounter (Signed)
LMTCB

## 2012-10-04 NOTE — Telephone Encounter (Signed)
Please see below and advise. thanks 

## 2012-10-04 NOTE — Assessment & Plan Note (Signed)
Improved with small dose of daily Lasix. However her persistent fatigue may warrant repeat echocardiogram for evaluation of ejection fraction. She will followup with Dr. Kirke Corin.

## 2012-10-04 NOTE — Telephone Encounter (Signed)
Metoprolol Tartrate 25 mg prn.

## 2012-10-04 NOTE — Assessment & Plan Note (Addendum)
Ideology unclear. She has no history of renal failure or anemia by labs done last month. Her continued symptoms may be due to worsening condition of sleep apnea or bradycardia due to her medications used to control her atrial fibrillation.. I recommended that she consider having a repeat sleep study.

## 2012-10-04 NOTE — Assessment & Plan Note (Addendum)
Secondary to degenerative joint disease. She's had prior arthroscopies. She is deferring total knee replacement but is considering having this done given the upcoming changes in health care

## 2012-10-04 NOTE — Addendum Note (Signed)
Addended by: Mauri Reading on: 10/04/2012 11:06 AM   Modules accepted: Orders

## 2012-10-04 NOTE — Telephone Encounter (Signed)
Pt says she was doing a lot of yard work yesterday and noticed HR got as high as 149 BPM.  She was able to rest, lay down and elevate feet/legs.  HR came back down to normal. She asks if there is a PRN med she can take for episodes like these, if they occur in the future.  She has had no other episodes and feel well this am.  HR is WNL. I told her I would discuss with Dr. Kirke Corin and call her back. She asks that I call her back on cell# [phone]

## 2012-10-04 NOTE — Assessment & Plan Note (Signed)
well controlled  

## 2012-10-04 NOTE — Assessment & Plan Note (Signed)
Her exam is normal. Her symptoms may be coming from deconditioning due to bilateral knee pain which is causing her walk differently. I have ordered a urinalysis to rule out urinary infection but the results have not been obtained yet. We'll followup on this today.

## 2012-10-05 ENCOUNTER — Telehealth: Payer: Self-pay | Admitting: Cardiovascular Disease

## 2012-10-05 ENCOUNTER — Other Ambulatory Visit: Payer: Self-pay

## 2012-10-05 MED ORDER — METOPROLOL TARTRATE 25 MG PO TABS: 25.0000 mg | ORAL_TABLET | ORAL | Status: DC | PRN

## 2012-10-05 NOTE — Telephone Encounter (Signed)
Pt informed Understanding verb New RX sent to pharm  

## 2012-10-05 NOTE — Telephone Encounter (Signed)
Pharmacy would like to clarify prescription refill for Metoprolol 25mg .  He says it was requested as needed.  He needs to know for how long and is this for a 90 day supply.  Please call back to advise.

## 2012-10-06 ENCOUNTER — Other Ambulatory Visit: Payer: Self-pay

## 2012-10-06 MED ORDER — METOPROLOL TARTRATE 25 MG PO TABS: 25.0000 mg | ORAL_TABLET | ORAL | Status: DC | PRN

## 2012-10-06 NOTE — Telephone Encounter (Signed)
Notified Kim pharmacist at [facility] the metoprolol prn is correct.

## 2012-10-08 LAB — URINE CULTURE

## 2012-10-10 ENCOUNTER — Other Ambulatory Visit: Payer: Self-pay

## 2012-10-10 DIAGNOSIS — I4892 Unspecified atrial flutter: Secondary | ICD-10-CM

## 2012-10-10 DIAGNOSIS — R0602 Shortness of breath: Secondary | ICD-10-CM

## 2012-10-10 MED ORDER — RIVAROXABAN 20 MG PO TABS: 20.0000 mg | ORAL_TABLET | Freq: Every day | ORAL | Status: DC

## 2012-11-02 ENCOUNTER — Telehealth: Payer: Self-pay | Admitting: Cardiovascular Disease

## 2012-11-02 DIAGNOSIS — R0602 Shortness of breath: Secondary | ICD-10-CM

## 2012-11-02 DIAGNOSIS — I4892 Unspecified atrial flutter: Secondary | ICD-10-CM

## 2012-11-02 MED ORDER — RIVAROXABAN 20 MG PO TABS: 20.0000 mg | ORAL_TABLET | Freq: Every day | ORAL | Status: DC

## 2012-11-02 NOTE — Telephone Encounter (Signed)
Pt informed samples left at [facility] Understanding verb.

## 2012-11-02 NOTE — Telephone Encounter (Signed)
Pt calling to see if we have any samples of Xarelto or if there is something else she can take. She is in the donut hole.

## 2012-11-23 ENCOUNTER — Other Ambulatory Visit: Payer: Self-pay

## 2012-11-23 MED ORDER — FUROSEMIDE 20 MG PO TABS: 20.0000 mg | ORAL_TABLET | Freq: Every day | ORAL | Status: DC

## 2012-11-23 NOTE — Telephone Encounter (Signed)
Refill request for Lasix 20 mg # 30 3 R sent electronic to [facility]

## 2012-12-15 ENCOUNTER — Encounter: Payer: Self-pay | Admitting: Cardiovascular Disease

## 2013-01-15 ENCOUNTER — Telehealth: Payer: Self-pay | Admitting: *Deleted

## 2013-01-15 NOTE — Telephone Encounter (Signed)
Pt calling states that she is having some problems with her BP and hr. 119/77 105/73  HR running 77-101

## 2013-01-15 NOTE — Telephone Encounter (Signed)
FYI

## 2013-01-15 NOTE — Telephone Encounter (Signed)
Pt says she got sick 12/28 and went to urgent care. She was prescribed prednisone and cefdinir x 6 days with no improvement in symptoms.  She began having elevated HR and low BP during this time.  She then went to ENT who started her on augmentin and amoxicillin x 14 days. During this time, HR stayed eleavted (90-105 BPM), BP was 105/72.  She says she completed abx 1/20 and wanted to give HR and BP "a chance to improve" off meds before calling us. Says yesterday HR still i high 90's and low 100's but today HR=91, 74, 75, bp=126/82, 101/62 She takes metoprolol PRN fast HR This am's readings were without metoprolol She asks if she should be concerned about fast HR Pt is asymptomatic I reassured her, as long as she is asymptomatic, and metoprolol helps bring HR down and BP ok, she should not be concerned I reassured her, if she has chronic atrial fib, main concern is anticoagulation (which she is taking) and HR control.  IF HR and BPs are remaining normal, the should not be cause for concern I told her to keep log of BP and HR over the next few days and call us wed/thurs to report I will also share info with Dr. Kirke Corin Understanding verb

## 2013-01-18 ENCOUNTER — Encounter: Payer: Self-pay | Admitting: Cardiovascular Disease

## 2013-01-18 ENCOUNTER — Ambulatory Visit (INDEPENDENT_AMBULATORY_CARE_PROVIDER_SITE_OTHER): Payer: Medicare Other | Admitting: Cardiovascular Disease

## 2013-01-18 VITALS — BP 128/82 | HR 88 | Ht 64.0 in | Wt 170.5 lb

## 2013-01-18 DIAGNOSIS — I4891 Unspecified atrial fibrillation: Secondary | ICD-10-CM

## 2013-01-18 DIAGNOSIS — I4819 Other persistent atrial fibrillation: Secondary | ICD-10-CM

## 2013-01-18 DIAGNOSIS — R0789 Other chest pain: Secondary | ICD-10-CM

## 2013-01-18 DIAGNOSIS — I10 Essential (primary) hypertension: Secondary | ICD-10-CM

## 2013-01-18 MED ORDER — METOPROLOL TARTRATE 25 MG PO TABS: 25.0000 mg | ORAL_TABLET | Freq: Two times a day (BID) | ORAL | Status: DC

## 2013-01-18 NOTE — Progress Notes (Signed)
HPI  [name] is a 77 year old female who is here today for a followup visit.  She has known history of atrial fibrillation/flutter status post multiple cardioversions in the past. Most recent echocardiogram was in April of last year which showed low normal LV systolic function with ejection fraction of 50-55%. 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 She had cardiac catheterization twice in the past without significant coronary artery disease.  She has been on Xarelto for anticoagulation. She has been on amiodarone for a few years now. Recently, she had an upper respiratory tract infection treated with antibiotics and prednisone. She started having increased palpitations and she felt that her heart was out of rhythm. She had few episodes of tachycardia with a maximum heart rate was around 105. Unlike her previous episodes of fibrillation, she does not feel significantly dyspneic and denies any chest pain.   Allergies  Allergen Reactions  . Codeine   . Iodine   . Warfarin Sodium      Current Outpatient Prescriptions on File Prior to Visit  Medication Sig Dispense Refill  . cetirizine (ZYRTEC) 10 MG tablet Take 1 tablet (10 mg total) by mouth daily.  30 tablet  11  . Cyanocobalamin (B-12) 2500 MCG TABS Take by mouth daily.      . furosemide (LASIX) 20 MG tablet Take 1 tablet (20 mg total) by mouth daily.  30 tablet  3  . metoprolol tartrate (LOPRESSOR) 25 MG tablet Take 1 tablet (25 mg total) by mouth 2 (two) times daily.  60 tablet  6  . omeprazole (PRILOSEC) 20 MG capsule Take 20 mg by mouth 2 (two) times daily.       . Red Yeast Rice Extract (RED YEAST RICE PO) Take 1,200 mg by mouth daily.      . Rivaroxaban (XARELTO) 20 MG TABS Take 1 tablet (20 mg total) by mouth daily.  35 tablet  0  . traMADol (ULTRAM) 50 MG tablet Take 50 mg by mouth as needed. For pain          Past Medical History  Diagnosis Date  . Atrial fibrillation    Paroxysmal, hx of  . Cardiomyopathy   . Dyspnea   . Headache     chronic  . Hypertension   . Pre-syncope   . Chronic kidney disease     acute renal failure secondary to dehydration which is now resolved  . Light headedness     due to dehydration  . Atrial flutter 02/2011    s/p cardioversion   . Hyperlipidemia      Past Surgical History  Procedure Date  . Cardioversion     x 3  . Cholecystectomy   . Appendectomy   . Multiple orthopedic procedures   . Mastectomy 1986    Bilateral with silicone  breast implants, s/p saline replacements  . Cardiac catheterization   . Combined augmentation mammaplasty and abdominoplasty   . Abdominal hysterectomy 1990     Family History  Problem Relation Age of Onset  . Heart disease Mother   . Cancer Father     stomach  . Cancer Sister     breast  . Cancer Maternal Grandmother     breast  . Cancer Sister     breast  . Diabetes Neg Hx   . Heart disease Son     found at autopsy     History   Social History  . Marital Status: Married  Spouse Name: N/A    Number of Children: N/A  . Years of Education: N/A   Occupational History  . Not on file.   Social History Main Topics  . Smoking status: Never Smoker   . Smokeless tobacco: Never Used  . Alcohol Use: No  . Drug Use: No  . Sexually Active: Not on file   Other Topics Concern  . Not on file   Social History Narrative  . No narrative on file     PHYSICAL EXAM   BP 128/82  Pulse 88  Ht 5\' 4"  (1.626 m)  Wt 170 lb 8 oz (77.338 kg)  BMI 29.27 kg/m2 Constitutional: She is oriented to person, place, and time. She appears well-developed and well-nourished. No distress.  HENT: No nasal discharge.  Head: Normocephalic and atraumatic.  Eyes: Pupils are equal and round. Right eye exhibits no discharge. Left eye exhibits no discharge.  Neck: Normal range of motion. Neck supple. No JVD present. No thyromegaly present.  Cardiovascular: Normal rate, irregular rhythm,  normal heart sounds. Exam reveals no gallop and no friction rub. No murmur heard.  Pulmonary/Chest: Effort normal and breath sounds normal. No stridor. No respiratory distress. She has no wheezes. She has no rales. She exhibits no tenderness.  Abdominal: Soft. Bowel sounds are normal. She exhibits no distension. There is no tenderness. There is no rebound and no guarding.  Musculoskeletal: Normal range of motion. She exhibits trace edema and no tenderness.  Neurological: She is alert and oriented to person, place, and time. Coordination normal.  Skin: Skin is warm and dry. No rash noted. She is not diaphoretic. No erythema. No pallor.  Psychiatric: She has a normal mood and affect. Her behavior is normal. Judgment and thought content normal.     EKG: Atypical atrial flutter with variable AV block.  ABNORMAL    ASSESSMENT AND PLAN

## 2013-01-18 NOTE — Assessment & Plan Note (Signed)
She is back in atypical atrial flutter. She had atrial fibrillation in the past. However, ventricular rate is not high and she does not appear to be symptomatic unlike previous episodes. Due to that, I do not recommend cardioversion. It's likely best to pursue rate control strategy at this point and to avoid being on long-term amiodarone given that she is not maintaining in sinus rhythm. Thus, I will go ahead and stop amiodarone. I will start her on metoprolol 25 mg twice daily. She has been having low blood pressure readings at home and thus I will stop amlodipine. She did have previous resting tachycardia when in sinus rhythm and will have to monitor her carefully on metoprolol. Continue long-term anticoagulation with Xarelto.

## 2013-01-18 NOTE — Assessment & Plan Note (Signed)
Stop amlodipine and start metoprolol as outlined above.

## 2013-01-18 NOTE — Patient Instructions (Addendum)
Stop Amlodipine.  Stop Amiodarone.  Start Metoprolol 25 mg twice daily.  Follow up in 2 weeks.

## 2013-01-26 ENCOUNTER — Telehealth: Payer: Self-pay | Admitting: *Deleted

## 2013-01-26 NOTE — Telephone Encounter (Signed)
Pt informed Understanding verb 

## 2013-01-26 NOTE — Telephone Encounter (Signed)
Pt husband calling wants to know if ok for pt to do some exercise on the treadmill

## 2013-01-26 NOTE — Telephone Encounter (Signed)
Yes, she should. No restrictions.

## 2013-01-26 NOTE — Telephone Encounter (Signed)
Is this ok?

## 2013-01-29 ENCOUNTER — Telehealth: Payer: Self-pay | Admitting: *Deleted

## 2013-01-29 ENCOUNTER — Ambulatory Visit (INDEPENDENT_AMBULATORY_CARE_PROVIDER_SITE_OTHER): Payer: Medicare Other

## 2013-01-29 VITALS — BP 110/70 | HR 89 | Ht 64.0 in | Wt 173.5 lb

## 2013-01-29 DIAGNOSIS — R0602 Shortness of breath: Secondary | ICD-10-CM

## 2013-01-29 DIAGNOSIS — R079 Chest pain, unspecified: Secondary | ICD-10-CM

## 2013-01-29 DIAGNOSIS — R6884 Jaw pain: Secondary | ICD-10-CM

## 2013-01-29 NOTE — Telephone Encounter (Signed)
Pt calling states that she is having some HR problems yesterday and this am. Had some pains in her temple

## 2013-01-29 NOTE — Progress Notes (Signed)
Pt here for EKG after c/o elevated HR and BP over w/e after recently stopping amiodarone at Dr. Jari Sportsman instructions Is now c/o left temporal pain as well as a "dull ache" between shoulder baldes, which she says has had before and was told it was arthritic pain Attempted to reach Dr. Kirke Corin to have him review EKG and advise He is scrubbed in at cath lab at [facility] I will review with Dr. Mariah Milling.  Per Dr. Mariah Milling, EKG is unchanged. Still atrial flutter I informed pt no acute changes on EKG I told her I would continue to try to reach Dr. Kirke Corin but if pain between shoulder blades/left temporal pain continues despite tylenol/NTG or if this gets worse, she should call 911 I also advised ok to take an exttra 25 mg of metoprolol if HR and Bp remains elevated Pt and husband both verb. Understanding  I was able to talk with Dr. Kirke Corin, who says "Have pt increase metoprolol to 50 mg PO BID. Keep f/u with me 2/14" VO Dr. Adline Mango, RN  Pt informed Understanding verb

## 2013-01-29 NOTE — Telephone Encounter (Signed)
Pt says since stopping amiodarone at last OV she has felt poorly. Says BP has been higher than usual as well as HR.  Confirms compliance with metoprolol 25 mg PO BID Also c/o new onset Left jaw/temple pain Yesterday am=130/80, HR=97 1200, HR=99 BPM This am BP=135/103, HR=99 BPM She took NTG SL x 1, BP went to 120/76, HR=106 Jaw pain has not improved C/o occasional chest tightness Was told by Dr. Kirke Corin at last OV to monitor symptoms and HR closely after m,ed change Has appt with [place] Friday I advised to come in now for EKG to assess rhythm Coming in now

## 2013-02-02 ENCOUNTER — Ambulatory Visit: Payer: Medicare Other | Admitting: Cardiovascular Disease

## 2013-02-06 ENCOUNTER — Ambulatory Visit (INDEPENDENT_AMBULATORY_CARE_PROVIDER_SITE_OTHER): Payer: Medicare Other | Admitting: Cardiovascular Disease

## 2013-02-06 ENCOUNTER — Encounter: Payer: Self-pay | Admitting: Cardiovascular Disease

## 2013-02-06 VITALS — BP 104/78 | HR 77 | Ht 64.0 in | Wt 172.2 lb

## 2013-02-06 DIAGNOSIS — I4891 Unspecified atrial fibrillation: Secondary | ICD-10-CM

## 2013-02-06 DIAGNOSIS — I4819 Other persistent atrial fibrillation: Secondary | ICD-10-CM

## 2013-02-06 DIAGNOSIS — I10 Essential (primary) hypertension: Secondary | ICD-10-CM

## 2013-02-06 MED ORDER — METOPROLOL TARTRATE 50 MG PO TABS: 50.0000 mg | ORAL_TABLET | Freq: Two times a day (BID) | ORAL | Status: DC

## 2013-02-06 NOTE — Assessment & Plan Note (Signed)
Her ventricular rate is currently well controlled and she is asymptomatic. Continue current dose of metoprolol. Continue long-term anticoagulation. I will likely repeat her echocardiogram in about 6 months to ensure stability and LV systolic function. In the past, she had worsening ejection fraction in the setting of atrial fibrillation. This is likely the maximum dose of metoprolol that can be used without causing significant hypotension. Additional rate control is needed, I will consider adding a small dose digoxin as a last resort

## 2013-02-06 NOTE — Assessment & Plan Note (Signed)
Blood pressure is well-controlled on metoprolol. 

## 2013-02-06 NOTE — Patient Instructions (Addendum)
Continue same medications.  Follow up in 6 months.  

## 2013-02-06 NOTE — Progress Notes (Signed)
HPI  [name] [name] is a 77 year old female who is here today for a followup visit.  She has known history of atrial fibrillation/flutter status post multiple cardioversions in the past. Most recent echocardiogram was in April of last year which showed low normal LV systolic function with ejection fraction of 50-55%. 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 She had cardiac catheterization twice in the past without significant coronary artery disease.  She has been on Xarelto for anticoagulation.  She was seen recently for palpitations and was found to be back in atypical atrial flutter. She didn't have significant symptoms related to that and thus I decided to pursue a rate control strategy. Amiodarone was stopped. I started her on metoprolol 25 mg twice daily and stop amlodipine. She called our office a week later with persistent palpitations. Thus, I increased the dose of metoprolol to 50 mg twice daily. Since then, she has not had any chest pain, shortness of breath or palpitations. She feels well.  Allergies  Allergen Reactions  . Codeine   . Iodine   . Warfarin Sodium      Current Outpatient Prescriptions on File Prior to Visit  Medication Sig Dispense Refill  . Cyanocobalamin (B-12) 2500 MCG TABS Take by mouth daily.      . furosemide (LASIX) 20 MG tablet Take 1 tablet (20 mg total) by mouth daily.  30 tablet  3  . omeprazole (PRILOSEC) 20 MG capsule Take 20 mg by mouth 2 (two) times daily.       . Red Yeast Rice Extract (RED YEAST RICE PO) Take 1,200 mg by mouth 2 (two) times daily.       . Rivaroxaban (XARELTO) 20 MG TABS Take 1 tablet (20 mg total) by mouth daily.  35 tablet  0  . traMADol (ULTRAM) 50 MG tablet Take 50 mg by mouth as needed. For pain        No current facility-administered medications on file prior to visit.     Past Medical History  Diagnosis Date  . Atrial fibrillation     Paroxysmal, hx of  . Cardiomyopathy   .  Dyspnea   . Headache     chronic  . Hypertension   . Pre-syncope   . Chronic kidney disease     acute renal failure secondary to dehydration which is now resolved  . Light headedness     due to dehydration  . Atrial flutter 02/2011    s/p cardioversion   . Hyperlipidemia      Past Surgical History  Procedure Laterality Date  . Cardioversion      x 3  . Cholecystectomy    . Appendectomy    . Multiple orthopedic procedures    . Mastectomy  1986    Bilateral with silicone  breast implants, s/p saline replacements  . Cardiac catheterization    . Combined augmentation mammaplasty and abdominoplasty    . Abdominal hysterectomy  1990     Family History  Problem Relation Age of Onset  . Heart disease Mother   . Cancer Father     stomach  . Cancer Sister     breast  . Cancer Maternal Grandmother     breast  . Cancer Sister     breast  . Diabetes Neg Hx   . Heart disease Son     found at autopsy     History   Social History  . Marital Status: Married  Spouse Name: N/A    Number of Children: N/A  . Years of Education: N/A   Occupational History  . Not on file.   Social History Main Topics  . Smoking status: Never Smoker   . Smokeless tobacco: Never Used  . Alcohol Use: No  . Drug Use: No  . Sexually Active: Not on file   Other Topics Concern  . Not on file   Social History Narrative  . No narrative on file     PHYSICAL EXAM   BP 104/78  Pulse 77  Ht 5\' 4"  (1.626 m)  Wt 172 lb 4 oz (78.132 kg)  BMI 29.55 kg/m2 Constitutional: She is oriented to person, place, and time. She appears well-developed and well-nourished. No distress.  HENT: No nasal discharge.  Head: Normocephalic and atraumatic.  Eyes: Pupils are equal and round. Right eye exhibits no discharge. Left eye exhibits no discharge.  Neck: Normal range of motion. Neck supple. No JVD present. No thyromegaly present.  Cardiovascular: Normal rate, irregular rhythm, normal heart sounds.  Exam reveals no gallop and no friction rub. No murmur heard.  Pulmonary/Chest: Effort normal and breath sounds normal. No stridor. No respiratory distress. She has no wheezes. She has no rales. She exhibits no tenderness.  Abdominal: Soft. Bowel sounds are normal. She exhibits no distension. There is no tenderness. There is no rebound and no guarding.  Musculoskeletal: Normal range of motion. She exhibits trace edema and no tenderness.  Neurological: She is alert and oriented to person, place, and time. Coordination normal.  Skin: Skin is warm and dry. No rash noted. She is not diaphoretic. No erythema. No pallor.  Psychiatric: She has a normal mood and affect. Her behavior is normal. Judgment and thought content normal.     EKG: Atypical atrial flutter with variable AV block.  ABNORMAL    ASSESSMENT AND PLAN

## 2013-02-19 ENCOUNTER — Telehealth: Payer: Self-pay | Admitting: *Deleted

## 2013-02-19 ENCOUNTER — Other Ambulatory Visit: Payer: Self-pay

## 2013-02-19 MED ORDER — METOPROLOL TARTRATE 50 MG PO TABS: 25.0000 mg | ORAL_TABLET | Freq: Two times a day (BID) | ORAL | Status: DC

## 2013-02-19 MED ORDER — DIGOXIN 125 MCG PO TABS: 0.1250 mg | ORAL_TABLET | Freq: Every day | ORAL | Status: DC

## 2013-02-19 NOTE — Telephone Encounter (Signed)
Pt taking metoprolol 50 mg BID HR=112,109,91,95,96 BPM BP=122/83 Pt reports "flu-like" symptoms while taking metoprolol C/o worsening fatigue and "no energy" She asks is there is an alternative to metoprolol I will ask Dr. Kirke Corin and call her back

## 2013-02-19 NOTE — Telephone Encounter (Signed)
Pt informed Understanding verb New RX sent to pharm  

## 2013-02-19 NOTE — Telephone Encounter (Signed)
Pt having problems with her Toprol and wants to speak to nurse about medications. Extremely tired

## 2013-02-19 NOTE — Telephone Encounter (Signed)
LMTCB

## 2013-02-19 NOTE — Telephone Encounter (Signed)
Add Digoxin 0.125 mg once daily.  Decrease Metoprolol to 25 mg bid after 4 days.

## 2013-02-20 ENCOUNTER — Institutional Professional Consult (permissible substitution): Payer: Medicare Other | Admitting: Pulmonary Disease

## 2013-03-13 ENCOUNTER — Encounter: Payer: Self-pay | Admitting: Pulmonary Disease

## 2013-03-13 ENCOUNTER — Ambulatory Visit (INDEPENDENT_AMBULATORY_CARE_PROVIDER_SITE_OTHER): Payer: Medicare Other | Admitting: Pulmonary Disease

## 2013-03-13 VITALS — BP 112/64 | HR 70 | Temp 97.5°F | Ht 64.0 in | Wt 171.0 lb

## 2013-03-13 DIAGNOSIS — R0982 Postnasal drip: Secondary | ICD-10-CM

## 2013-03-13 MED ORDER — FLUTICASONE PROPIONATE 50 MCG/ACT NA SUSP: 1.0000 | Freq: Every day | NASAL | Status: DC

## 2013-03-13 NOTE — Patient Instructions (Addendum)
Use Saline sprays at least twice per day using the instructions on the package. 1/2 hour after using the saline sprays, use Flonase one puff in each nostril once per day. Use chlortrimeton and phenylephrine as needed for the cough.  We will see you again in 4-6 weeks or sooner if needed

## 2013-03-13 NOTE — Assessment & Plan Note (Signed)
[name] [name] states that she's here to see [place] primarily for her postnasal drip. It sounds as if she has an allergic component to her disease as in the past she has had worsening symptoms during the spring and fall. It sounds as if she has never given a nasal steroid a thorough attempt and she does not use saline rinses regularly.  She has nothing on history to suggest an underlying pulmonary disease. Further, she saw my partner Dr. Shelle Iron several years ago who agreed with this assessment and performed pulmonary function testing which was normal.  Plan: -add chlorpheniramine-phenylephrine combination tab as directed for postnasal drip  -start Flonase -start saline sprays -maintain follow up with Dr. Jenne Campus -f/u with [place] PRN

## 2013-03-13 NOTE — Progress Notes (Signed)
Subjective:    Patient ID: [name] [name], female    DOB: [birth year], 77 y.o.   MRN: [id number]  HPI This is a 77 year old female with a past medical history significant for obstructive sleep apnea who comes to clinic today for evaluation of sinus congestion, hoarseness and throat congestion. She is a never smoker and had a normal childhood without respiratory illnesses. She states that for the past several months she's had significant postnasal drip and drainage into her throat. This associated with hoarseness and and feeling stopped up in the mornings. This is been associated with a feeling of year for this congestion. This started just before Christmas when she had a cold which is associated with significant sinus congestion and epistaxis. She was treated with antibiotics for approximately 4 weeks. Since then this significant sinus pressure has improved but she continues to have a postnasal drip. She does not take any antihistamines, decongestants or nasal sprays for this. She has been seen by Dr. Jenne Campus with [facility] the past. She denies cough and chest congestion and shortness of breath. She denies acid reflux. She says that she snores frequently and has had a UP3 procedure in the past. She was on oxygen at some point many years ago with sleep but she says this was taken off because she got better. She does not believe that she has obstructive sleep apnea that requires CPAP. She says she has had multiple sleep studies in the past. Apparently the UP3 procedure was performed just for heavy snoring. Her sinus congestion typically gets worse in the spring and the fall.   Past Medical History  Diagnosis Date  . Atrial fibrillation     Paroxysmal, hx of  . Cardiomyopathy   . Dyspnea   . Headache     chronic  . Hypertension   . Pre-syncope   . Chronic kidney disease     acute renal failure secondary to dehydration which is now resolved  . Light headedness     due to  dehydration  . Atrial flutter 02/2011    s/p cardioversion   . Hyperlipidemia      Family History  Problem Relation Age of Onset  . Heart disease Mother   . Cancer Father     stomach  . Cancer Sister     breast  . Cancer Maternal Grandmother     breast  . Cancer Sister     breast  . Diabetes Neg Hx   . Heart disease Son     found at autopsy     History   Social History  . Marital Status: Married    Spouse Name: N/A    Number of Children: 1  . Years of Education: N/A   Occupational History  .     Social History Main Topics  . Smoking status: Never Smoker   . Smokeless tobacco: Never Used  . Alcohol Use: No  . Drug Use: No  . Sexually Active: Not on file   Other Topics Concern  . Not on file   Social History Narrative  . No narrative on file     Allergies  Allergen Reactions  . Biaxin (Clarithromycin)   . Codeine   . Iodine   . Warfarin Sodium      Outpatient Prescriptions Prior to Visit  Medication Sig Dispense Refill  . Cyanocobalamin (B-12) 2500 MCG TABS Take by mouth daily.      . digoxin (LANOXIN) 0.125 MG tablet Take 1  tablet (0.125 mg total) by mouth daily.  90 tablet  3  . furosemide (LASIX) 20 MG tablet Take 1 tablet (20 mg total) by mouth daily.  30 tablet  3  . metoprolol (LOPRESSOR) 50 MG tablet Take 0.5 tablets (25 mg total) by mouth 2 (two) times daily.  60 tablet  6  . omeprazole (PRILOSEC) 20 MG capsule Take 20 mg by mouth 2 (two) times daily.       . Red Yeast Rice Extract (RED YEAST RICE PO) Take 1,200 mg by mouth 2 (two) times daily.       . Rivaroxaban (XARELTO) 20 MG TABS Take 1 tablet (20 mg total) by mouth daily.  35 tablet  0  . traMADol (ULTRAM) 50 MG tablet Take 50 mg by mouth as needed. For pain        No facility-administered medications prior to visit.      Review of Systems  Constitutional: Negative for fever, chills and unexpected weight change.  HENT: Positive for voice change and postnasal drip. Negative for ear  pain, nosebleeds, congestion, sore throat, rhinorrhea, sneezing, trouble swallowing, dental problem and sinus pressure.   Eyes: Negative for visual disturbance.  Respiratory: Positive for cough and shortness of breath. Negative for choking.   Cardiovascular: Negative for chest pain and leg swelling.  Gastrointestinal: Negative for vomiting, abdominal pain and diarrhea.  Genitourinary: Negative for difficulty urinating.  Musculoskeletal: Positive for arthralgias.  Skin: Negative for rash.  Neurological: Negative for tremors, syncope and headaches.  Hematological: Does not bruise/bleed easily.       Objective:   Physical Exam  Filed Vitals:   03/13/13 1357  BP: 112/64  Pulse: 70  Temp: 97.5 F (36.4 C)  TempSrc: Oral  Height: 5\' 4"  (1.626 m)  Weight: 77.565 kg (171 lb)  SpO2: 97%   Gen: well appearing, no acute distress HEENT: NCAT, PERRL, EOMi, OP clear, neck supple without masses PULM: CTA B CV: RRR, slight systolic murmur, no JVD AB: BS+, soft, nontender, no hsm Ext: warm, trace edema, no clubbing, no cyanosis Derm: no rash or skin breakdown Neuro: A&Ox4, CN II-XII intact, strength 5/5 in all 4 extremities      Assessment & Plan:   Post-nasal drip Mrs. Arch states that she's here to see [place] primarily for her postnasal drip. It sounds as if she has an allergic component to her disease as in the past she has had worsening symptoms during the spring and fall. It sounds as if she has never given a nasal steroid a thorough attempt and she does not use saline rinses regularly.  She has nothing on history to suggest an underlying pulmonary disease. Further, she saw my partner Dr. Shelle Iron several years ago who agreed with this assessment and performed pulmonary function testing which was normal.  Plan: -add chlorpheniramine-phenylephrine combination tab as directed for postnasal drip  -start Flonase -start saline sprays -maintain follow up with Dr. Jenne Campus -f/u with [place]  PRN   Updated Medication List Outpatient Encounter Prescriptions as of 03/13/2013  Medication Sig Dispense Refill  . Cyanocobalamin (B-12) 2500 MCG TABS Take by mouth daily.      . digoxin (LANOXIN) 0.125 MG tablet Take 1 tablet (0.125 mg total) by mouth daily.  90 tablet  3  . furosemide (LASIX) 20 MG tablet Take 1 tablet (20 mg total) by mouth daily.  30 tablet  3  . metoprolol (LOPRESSOR) 50 MG tablet Take 0.5 tablets (25 mg total) by mouth 2 (two) times  daily.  60 tablet  6  . omeprazole (PRILOSEC) 20 MG capsule Take 20 mg by mouth 2 (two) times daily.       . Red Yeast Rice Extract (RED YEAST RICE PO) Take 1,200 mg by mouth 2 (two) times daily.       . Rivaroxaban (XARELTO) 20 MG TABS Take 1 tablet (20 mg total) by mouth daily.  35 tablet  0  . traMADol (ULTRAM) 50 MG tablet Take 50 mg by mouth as needed. For pain       . fluticasone (FLONASE) 50 MCG/ACT nasal spray Place 1 spray into the nose daily.  48 g  2   No facility-administered encounter medications on file as of 03/13/2013.

## 2013-03-16 ENCOUNTER — Ambulatory Visit: Payer: Medicare Other | Admitting: Cardiovascular Disease

## 2013-03-23 ENCOUNTER — Telehealth: Payer: Self-pay | Admitting: *Deleted

## 2013-03-23 NOTE — Telephone Encounter (Signed)
Pt came into the office stating that she is needing to have knee surgery and wants to know if she needs to see Dr Kirke Corin prior to the surgery. Dr Ernest Pine will be doing the surgery. Surgery is not set up yet but looking at about 2 weeks.

## 2013-03-26 NOTE — Telephone Encounter (Signed)
Is she cleared for surg?

## 2013-03-26 NOTE — Telephone Encounter (Signed)
She probably will be ok but she should come for an office visit given the recent changes in her medications. We have to make sure her BP and heart rate are controlled.

## 2013-03-26 NOTE — Telephone Encounter (Signed)
See below

## 2013-03-27 ENCOUNTER — Emergency Department: Payer: Self-pay | Admitting: Emergency Medicine

## 2013-03-27 ENCOUNTER — Telehealth: Payer: Self-pay

## 2013-03-27 ENCOUNTER — Ambulatory Visit (INDEPENDENT_AMBULATORY_CARE_PROVIDER_SITE_OTHER): Payer: Medicare Other

## 2013-03-27 VITALS — BP 158/82 | HR 67 | Ht 64.0 in | Wt 172.5 lb

## 2013-03-27 DIAGNOSIS — I4891 Unspecified atrial fibrillation: Secondary | ICD-10-CM

## 2013-03-27 LAB — CBC
HCT: 44 % (ref 35.0–47.0)
HGB: 14.6 g/dL (ref 12.0–16.0)
MCH: 31.7 pg (ref 26.0–34.0)
MCHC: 33.1 g/dL (ref 32.0–36.0)
MCV: 96 fL (ref 80–100)
Platelet: 214 10*3/uL (ref 150–440)
RBC: 4.59 10*6/uL (ref 3.80–5.20)
RDW: 13.8 % (ref 11.5–14.5)
WBC: 8.3 10*3/uL (ref 3.6–11.0)

## 2013-03-27 LAB — PROTIME-INR
INR: 1.5
Prothrombin Time: 17.8 secs — ABNORMAL HIGH (ref 11.5–14.7)

## 2013-03-27 NOTE — Telephone Encounter (Signed)
I received t/c from Dr. Margarita Grizzle in ER saying he is getting ready to d/c pt Says she had negative CT of head, still c/o h/a, but suggests we f/u re:EKG changes Pt still denies CP and sob in ER I will let Dr. Kirke Corin know.

## 2013-03-27 NOTE — Telephone Encounter (Signed)
Pt called this am c/o high BP over the last 3 days that she cannot get controlled BP=160/101, 149/99, 168/96,  This is associated with severe h/a Says h/a began last night associated with high BP=168/96 and nausea She took a SL NTG since she had already taken her BP meds that day She checked BP again last night and found it to still be elevated and continues to have h/a This am BP=160/101, she took NTG SL x2 and BP came down only slightly to 154/103 She continues to have h/a, although she has taken 2 NTG this am. I explained h/a could be coming from NTG She took BP meds this am at 0800 I had her check BP again while on the phone. It was 149/99, HR=78 She confirms compliance with medications, including Xarelto Denies blurred vision, slurred speech or numbness in extremities  I advised her to come in for BP check and EKG this am She and husband are coming in this am

## 2013-03-27 NOTE — Progress Notes (Signed)
Pt c/o sudden onset h/a associated with high BP and nausea Denies slurred speech, blurred vision Says she took metoprolol x 2 this am with NTG SL x 2 this am d/t persistent high BP She c/o feeling "lightheaded" and "foggy" Denies CP, SOB Husband is with her EKG was performed and shows to be changed compared to last EKG Still shows atrial fib but with new T wave inversion I called and spoke with Dr. Kirke Corin in [place] who advised to send pt to ER. I called non-emergency transport to take her to ER Charge nurse at [facility] notified

## 2013-03-27 NOTE — Telephone Encounter (Signed)
Ok. EKG changes are likely due to Digoxin which is not unusual. Continue to monitor HR and BP.

## 2013-03-28 NOTE — Telephone Encounter (Signed)
Pt informed Understanding verb Pt reports h/a and BP have improved She will let us know should she need to be seen sooner than f/u appt 4/21

## 2013-03-29 ENCOUNTER — Telehealth: Payer: Self-pay | Admitting: *Deleted

## 2013-03-29 NOTE — Telephone Encounter (Signed)
Pt calling needs clearance letter for knee surgery. Pt has appt with Dr Ernest Pine in May. Pt also has appt with Arida on the 21st and would pick up then if ok to have surgery

## 2013-03-29 NOTE — Telephone Encounter (Signed)
Will address at OV this month

## 2013-04-02 ENCOUNTER — Encounter: Payer: Medicare Other | Admitting: Internal Medicine

## 2013-04-09 ENCOUNTER — Encounter: Payer: Self-pay | Admitting: Cardiovascular Disease

## 2013-04-09 ENCOUNTER — Ambulatory Visit (INDEPENDENT_AMBULATORY_CARE_PROVIDER_SITE_OTHER): Payer: Medicare Other | Admitting: Cardiovascular Disease

## 2013-04-09 VITALS — BP 122/72 | HR 63 | Ht 64.0 in | Wt 171.5 lb

## 2013-04-09 DIAGNOSIS — I10 Essential (primary) hypertension: Secondary | ICD-10-CM

## 2013-04-09 DIAGNOSIS — Z01810 Encounter for preprocedural cardiovascular examination: Secondary | ICD-10-CM | POA: Insufficient documentation

## 2013-04-09 DIAGNOSIS — I4819 Other persistent atrial fibrillation: Secondary | ICD-10-CM

## 2013-04-09 DIAGNOSIS — I4891 Unspecified atrial fibrillation: Secondary | ICD-10-CM

## 2013-04-09 NOTE — Progress Notes (Signed)
HPI  [name] [name] is a 77 year old female who is here today for a followup visit.  She has known history of atrial fibrillation/flutter status post multiple cardioversions in the past. Most recent echocardiogram was in April of  2013 which showed low normal LV systolic function with ejection fraction of 50-55%. 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 She had cardiac catheterization twice in the past without significant coronary artery disease.  She has been on Xarelto for anticoagulation.  She was seen early this year for palpitations and was found to be back in atypical atrial flutter. She didn't have significant symptoms related to that and thus I decided to pursue a rate control strategy. Amiodarone was stopped. I started her on metoprolol 25 mg twice daily and stop amlodipine. She called our office a week later with persistent palpitations. Thus, I increased the dose of metoprolol to 50 mg twice daily. However, she had significant fatigue on that dose. Due to that, I decreased metoprolol back to 25 mg twice daily and added digoxin 0.125 mg once daily. On April 8, she reported significantly elevated blood pressure with severe headache. Given that she is on anticoagulation, we instructed her to go to the emergency room. CT head showed no evidence of bleed. She is feeling back to her normal self. She has severe osteoarthritis involving the left knee and will be undergoing left knee replacement by Dr. Ernest Pine. She denies any chest pain, dyspnea or palpitations.  Allergies  Allergen Reactions  . Biaxin (Clarithromycin)   . Codeine   . Iodine   . Warfarin Sodium      Current Outpatient Prescriptions on File Prior to Visit  Medication Sig Dispense Refill  . Cyanocobalamin (B-12) 2500 MCG TABS Take by mouth daily.      . digoxin (LANOXIN) 0.125 MG tablet Take 1 tablet (0.125 mg total) by mouth daily.  90 tablet  3  . fluticasone (FLONASE) 50 MCG/ACT nasal  spray Place 1 spray into the nose daily.  48 g  2  . furosemide (LASIX) 20 MG tablet Take 1 tablet (20 mg total) by mouth daily.  30 tablet  3  . metoprolol (LOPRESSOR) 50 MG tablet Take 0.5 tablets (25 mg total) by mouth 2 (two) times daily.  60 tablet  6  . omeprazole (PRILOSEC) 20 MG capsule Take 20 mg by mouth 2 (two) times daily.       . Red Yeast Rice Extract (RED YEAST RICE PO) Take 1,200 mg by mouth 2 (two) times daily.       . Rivaroxaban (XARELTO) 20 MG TABS Take 1 tablet (20 mg total) by mouth daily.  35 tablet  0  . traMADol (ULTRAM) 50 MG tablet Take 50 mg by mouth as needed. For pain        No current facility-administered medications on file prior to visit.     Past Medical History  Diagnosis Date  . Atrial fibrillation     Paroxysmal, hx of  . Cardiomyopathy   . Dyspnea   . Headache     chronic  . Hypertension   . Pre-syncope   . Chronic kidney disease     acute renal failure secondary to dehydration which is now resolved  . Light headedness     due to dehydration  . Atrial flutter 02/2011    s/p cardioversion   . Hyperlipidemia      Past Surgical History  Procedure Laterality Date  . Cardioversion  x 3  . Cholecystectomy    . Appendectomy    . Multiple orthopedic procedures    . Mastectomy  1986    Bilateral with silicone  breast implants, s/p saline replacements  . Cardiac catheterization    . Combined augmentation mammaplasty and abdominoplasty    . Abdominal hysterectomy  1990     Family History  Problem Relation Age of Onset  . Heart disease Mother   . Cancer Father     stomach  . Cancer Sister     breast  . Cancer Maternal Grandmother     breast  . Cancer Sister     breast  . Diabetes Neg Hx   . Heart disease Son     found at autopsy     History   Social History  . Marital Status: Married    Spouse Name: N/A    Number of Children: 1  . Years of Education: N/A   Occupational History  .     Social History Main Topics    . Smoking status: Never Smoker   . Smokeless tobacco: Never Used  . Alcohol Use: No  . Drug Use: No  . Sexually Active: Not on file   Other Topics Concern  . Not on file   Social History Narrative  . No narrative on file     PHYSICAL EXAM   There were no vitals taken for this visit. Constitutional: She is oriented to person, place, and time. She appears well-developed and well-nourished. No distress.  HENT: No nasal discharge.  Head: Normocephalic and atraumatic.  Eyes: Pupils are equal and round. Right eye exhibits no discharge. Left eye exhibits no discharge.  Neck: Normal range of motion. Neck supple. No JVD present. No thyromegaly present.  Cardiovascular: Normal rate, irregular rhythm, normal heart sounds. Exam reveals no gallop and no friction rub. No murmur heard.  Pulmonary/Chest: Effort normal and breath sounds normal. No stridor. No respiratory distress. She has no wheezes. She has no rales. She exhibits no tenderness.  Abdominal: Soft. Bowel sounds are normal. She exhibits no distension. There is no tenderness. There is no rebound and no guarding.  Musculoskeletal: Normal range of motion. She exhibits trace edema and no tenderness.  Neurological: She is alert and oriented to person, place, and time. Coordination normal.  Skin: Skin is warm and dry. No rash noted. She is not diaphoretic. No erythema. No pallor.  Psychiatric: She has a normal mood and affect. Her behavior is normal. Judgment and thought content normal.     EKG: Atypical atrial flutter with variable AV block -Right bundle branch block.   -  Diffuse ST changes likely due to digoxin effect  ABNORMAL    ASSESSMENT AND PLAN

## 2013-04-09 NOTE — Patient Instructions (Addendum)
Labs today.  Continue same medications.  You are low risk for knee surgery. Stop Xarelto 2 days before the surgery and resume as soon as possible after surgery.  Follow up in 6 months.

## 2013-04-09 NOTE — Assessment & Plan Note (Signed)
She is currently being treated with rate control. Her current rhythm is still atypical atrial flutter. She did have atrial fibrillation in the past. Ventricular rate seems to be well-controlled on current dose of metoprolol and digoxin. I will check basic metabolic profile and digoxin level.

## 2013-04-09 NOTE — Assessment & Plan Note (Signed)
[name] [name] has chronic atrial fibrillation which is being treated with anticoagulation and rate control. Echocardiogram from last year showed normal LV systolic function. Previous cardiac catheterization showed no evidence of coronary artery disease. Currently she has no symptoms suggestive of angina or heart failure. Her functional capacity is reasonable. Due to all of that, she is considered at an overall low risk for cardiovascular complications. No further cardiac workup is needed before the surgery. If interruption of anticoagulation is needed, I recommend stopping Xarelto 2 days before the surgery and resuming it as soon as safe from a surgical standpoint. This should help with DVT prophylaxis as well.

## 2013-04-09 NOTE — Assessment & Plan Note (Signed)
Blood pressure is well controlled on current dose of metoprolol

## 2013-04-10 LAB — BASIC METABOLIC PANEL
BUN/Creatinine Ratio: 15 (ref 11–26)
BUN: 13 mg/dL (ref 8–27)
CO2: 24 mmol/L (ref 19–28)
Calcium: 9.2 mg/dL (ref 8.6–10.2)
Chloride: 103 mmol/L (ref 97–108)
Creatinine, Ser: 0.88 mg/dL (ref 0.57–1.00)
GFR calc Af Amer: 74 mL/min/{1.73_m2} (ref >59)
GFR calc non Af Amer: 64 mL/min/{1.73_m2} (ref >59)
Glucose: 89 mg/dL (ref 65–99)
Potassium: 4.8 mmol/L (ref 3.5–5.2)
Sodium: 139 mmol/L (ref 134–144)

## 2013-04-10 LAB — DIGOXIN LEVEL: Digoxin Level: 1.5 ng/mL (ref 0.9–2.0)

## 2013-04-11 NOTE — Progress Notes (Signed)
Pt informed of normal labs.

## 2013-04-17 ENCOUNTER — Encounter: Payer: Self-pay | Admitting: Cardiovascular Disease

## 2013-04-23 ENCOUNTER — Encounter: Payer: Self-pay | Admitting: Internal Medicine

## 2013-04-23 ENCOUNTER — Ambulatory Visit (INDEPENDENT_AMBULATORY_CARE_PROVIDER_SITE_OTHER): Payer: Medicare Other | Admitting: Internal Medicine

## 2013-04-23 VITALS — BP 118/82 | HR 75 | Temp 97.4°F | Resp 16 | Ht 63.0 in | Wt 170.2 lb

## 2013-04-23 DIAGNOSIS — Z1239 Encounter for other screening for malignant neoplasm of breast: Secondary | ICD-10-CM

## 2013-04-23 DIAGNOSIS — M25562 Pain in left knee: Secondary | ICD-10-CM

## 2013-04-23 DIAGNOSIS — R5381 Other malaise: Secondary | ICD-10-CM

## 2013-04-23 DIAGNOSIS — F4329 Adjustment disorder with other symptoms: Secondary | ICD-10-CM | POA: Insufficient documentation

## 2013-04-23 DIAGNOSIS — E785 Hyperlipidemia, unspecified: Secondary | ICD-10-CM

## 2013-04-23 DIAGNOSIS — R5383 Other fatigue: Secondary | ICD-10-CM

## 2013-04-23 DIAGNOSIS — F4321 Adjustment disorder with depressed mood: Secondary | ICD-10-CM | POA: Insufficient documentation

## 2013-04-23 DIAGNOSIS — F341 Dysthymic disorder: Secondary | ICD-10-CM

## 2013-04-23 DIAGNOSIS — Z803 Family history of malignant neoplasm of breast: Secondary | ICD-10-CM | POA: Insufficient documentation

## 2013-04-23 DIAGNOSIS — F32A Depression, unspecified: Secondary | ICD-10-CM

## 2013-04-23 DIAGNOSIS — F329 Major depressive disorder, single episode, unspecified: Secondary | ICD-10-CM

## 2013-04-23 DIAGNOSIS — E559 Vitamin D deficiency, unspecified: Secondary | ICD-10-CM

## 2013-04-23 DIAGNOSIS — Z0000 Encounter for general adult medical examination without abnormal findings: Secondary | ICD-10-CM | POA: Insufficient documentation

## 2013-04-23 DIAGNOSIS — M25561 Pain in right knee: Secondary | ICD-10-CM

## 2013-04-23 DIAGNOSIS — E538 Deficiency of other specified B group vitamins: Secondary | ICD-10-CM

## 2013-04-23 DIAGNOSIS — F419 Anxiety disorder, unspecified: Secondary | ICD-10-CM

## 2013-04-23 DIAGNOSIS — M25569 Pain in unspecified knee: Secondary | ICD-10-CM

## 2013-04-23 LAB — CBC WITH DIFFERENTIAL/PLATELET
Basophils Absolute: 0 10*3/uL (ref 0.0–0.1)
Basophils Relative: 0.5 % (ref 0.0–3.0)
Eosinophils Absolute: 0.1 10*3/uL (ref 0.0–0.7)
Eosinophils Relative: 1 % (ref 0.0–5.0)
HCT: 43.7 % (ref 36.0–46.0)
Hemoglobin: 14.7 g/dL (ref 12.0–15.0)
Lymphocytes Relative: 19 % (ref 12.0–46.0)
Lymphs Abs: 1.3 10*3/uL (ref 0.7–4.0)
MCHC: 33.6 g/dL (ref 30.0–36.0)
MCV: 95.1 fl (ref 78.0–100.0)
Monocytes Absolute: 0.4 10*3/uL (ref 0.1–1.0)
Monocytes Relative: 6 % (ref 3.0–12.0)
Neutro Abs: 4.9 10*3/uL (ref 1.4–7.7)
Neutrophils Relative %: 73.5 % (ref 43.0–77.0)
Platelets: 226 10*3/uL (ref 150.0–400.0)
RBC: 4.59 Mil/uL (ref 3.87–5.11)
RDW: 14.3 % (ref 11.5–14.6)
WBC: 6.7 10*3/uL (ref 4.5–10.5)

## 2013-04-23 MED ORDER — OMEPRAZOLE 20 MG PO CPDR: 20.0000 mg | DELAYED_RELEASE_CAPSULE | Freq: Two times a day (BID) | ORAL | Status: DC

## 2013-04-23 MED ORDER — ZOSTER VACCINE LIVE 19400 UNT/0.65ML ~~LOC~~ SOLR: 0.6500 mL | Freq: Once | SUBCUTANEOUS | Status: DC

## 2013-04-23 NOTE — Assessment & Plan Note (Signed)
Discussed trial of SSRI if symptoms persist.

## 2013-04-23 NOTE — Progress Notes (Signed)
Patient ID: [name] [name], female   DOB: [birth year], 77 y.o.   MRN: [id number]  The patient is here for annual Medicare wellness examination and management of other chronic and acute problems.   She has constant knee pain and has gained weight du e to inactivity.  She is awaiting knee replacement by Dr Ernest Pine.,  And needs preoperative Clearance requested from and has already seen Dr..Arida .  She has a history of atrial fib on Xarelto , with prior coumadin  Causing a left retinal hemorrhage with blurred vision finally resolved after a year.   Having loose post prandial stools occurring 3 times daily for the past month.  GI appt sheduled May 8 with Dr Randa Evens in [place].  No recent travel or antibiotics.  No weight loss,  cramping or blood in stools  Still grieving over the loss of her son over a year ago who died unexpectedly    The risk factors are reflected in the social history.  The roster of all physicians providing medical care to patient - is listed in the Snapshot section of the chart.  Activities of daily living:  The patient is 100% independent in all ADLs: dressing, toileting, feeding as well as independent mobility  Home safety : The patient has smoke detectors in the home. They wear seatbelts.  There are no firearms at home. There is no violence in the home.   There is no risks for hepatitis, STDs or HIV. There is no   history of blood transfusion. They have no travel history to infectious disease endemic areas of the world.  The patient has seen their dentist in the last six month. They have seen their eye doctor in the last year. They admit to slight hearing difficulty with regard to whispered voices and some television programs.  They have deferred audiologic testing in the last year.  They do not  have excessive sun exposure. Discussed the need for sun protection: hats, long sleeves and use of sunscreen if there is significant sun exposure.   Diet: the importance of a healthy diet is  discussed. They do have a healthy diet.  The benefits of regular aerobic exercise were discussed. She walks 4 times per week ,  20 minutes.   Depression screen: there are no signs or vegative symptoms of depression- irritability, change in appetite, anhedonia, sadness/tearfullness.  Cognitive assessment: the patient manages all their financial and personal affairs and is actively engaged. They could relate day,date,year and events; recalled 2/3 objects at 3 minutes; performed clock-face test normally.  The following portions of the patient's history were reviewed and updated as appropriate: allergies, current medications, past family history, past medical history,  past surgical history, past social history  and problem list.  Visual acuity was not assessed per patient preference since she has regular follow up with her ophthalmologist. Hearing and body mass index were assessed and reviewed.   During the course of the visit the patient was educated and counseled about appropriate screening and preventive services including : fall prevention , diabetes screening, nutrition counseling, colorectal cancer screening, and recommended immunizations.   Objective:  BP 118/82  Pulse 75  Temp(Src) 97.4 F (36.3 C) (Oral)  Resp 16  Ht 5\' 3"  (1.6 m)  Wt 170 lb 4 oz (77.225 kg)  BMI 30.17 kg/m2  SpO2 97%  General Appearance:    Alert, cooperative, no distress, appears stated age  Head:    Normocephalic, without obvious abnormality, atraumatic  Eyes:  PERRL, conjunctiva/corneas clear, EOM's intact, fundi    benign, both eyes  Ears:    Normal TM's and external ear canals, both ears  Nose:   Nares normal, septum midline, mucosa normal, no drainage    or sinus tenderness  Throat:   Lips, mucosa, and tongue normal; teeth and gums normal  Neck:   Supple, symmetrical, trachea midline, no adenopathy;    thyroid:  no enlargement/tenderness/nodules; no carotid   bruit or JVD  Back:     Symmetric, no  curvature, ROM normal, no CVA tenderness  Lungs:     Clear to auscultation bilaterally, respirations unlabored  Chest Wall:    No tenderness or deformity   Heart:    Regular rate and rhythm, S1 and S2 normal, no murmur, rub   or gallop  Breast Exam:    No tenderness, masses, or nipple abnormality  Abdomen:     Soft, non-tender, bowel sounds active all four quadrants,    no masses, no organomegaly  Genitalia:    Pelvic: cervix normal in appearance, external genitalia normal, no adnexal masses or tenderness, no cervical motion tenderness, rectovaginal septum normal, uterus normal size, shape, and consistency and vagina normal without discharge  Extremities:   Extremities normal, atraumatic, no cyanosis or edema  Pulses:   2+ and symmetric all extremities  Skin:   Skin color, texture, turgor normal, no rashes or lesions  Lymph nodes:   Cervical, supraclavicular, and axillary nodes normal  Neurologic:   CNII-XII intact, normal strength, sensation and reflexes    throughout    Assessment and Plan:  Routine general medical examination at a health care facility Annual comprehensive exam was done including breast and pelvic exam . All screenings have been addressed .   Anxiety and depression Discussed trial of SSRI if symptoms persist.   Family history of breast cancer in female Breast exam done, mammogram ordered  Knee pain, bilateral She is awaiting TKR by Dr. Ernest Pine.  Medically she is cleared for surgery    Updated Medication List Outpatient Encounter Prescriptions as of 04/23/2013  Medication Sig Dispense Refill  . Cyanocobalamin (B-12) 2500 MCG TABS Take by mouth daily.      . digoxin (LANOXIN) 0.125 MG tablet Take 1 tablet (0.125 mg total) by mouth daily.  90 tablet  3  . furosemide (LASIX) 20 MG tablet Take 20 mg by mouth daily as needed.      . metoprolol (LOPRESSOR) 50 MG tablet Take 0.5 tablets (25 mg total) by mouth 2 (two) times daily.  60 tablet  6  . omeprazole (PRILOSEC)  20 MG capsule Take 1 capsule (20 mg total) by mouth 2 (two) times daily.  90 capsule  3  . Red Yeast Rice Extract (RED YEAST RICE PO) Take 1,200 mg by mouth 2 (two) times daily.       . Rivaroxaban (XARELTO) 20 MG TABS Take 1 tablet (20 mg total) by mouth daily.  35 tablet  0  . traMADol (ULTRAM) 50 MG tablet Take 50 mg by mouth as needed. For pain       . [DISCONTINUED] omeprazole (PRILOSEC) 20 MG capsule Take 20 mg by mouth 2 (two) times daily.       [name] zoster vaccine live, PF, (ZOSTAVAX) 19147 UNT/0.65ML injection Inject 19,400 Units into the skin once.  1 each  0   No facility-administered encounter medications on file as of 04/23/2013.

## 2013-04-23 NOTE — Patient Instructions (Addendum)
You can use tramadol and 2 ibuprofen at night as needed for knee pain   I recommend the shingles vaccine and have printed a prescription for it.   If you continue to feel sad after you have your knee surgery, please let me know and we can try a medication to help you overcome your depression

## 2013-04-23 NOTE — Assessment & Plan Note (Signed)
Annual comprehensive exam was done including breast and pelvic exam. All screenings have been addressed .  

## 2013-04-23 NOTE — Assessment & Plan Note (Signed)
Breast exam done, mammogram ordered  

## 2013-04-23 NOTE — Assessment & Plan Note (Deleted)
Cussed trial of SSRI if symptoms persist.

## 2013-04-23 NOTE — Assessment & Plan Note (Signed)
She is awaiting TKR by Dr. Ernest Pine.  Medically she is cleared for surgery

## 2013-04-24 ENCOUNTER — Ambulatory Visit: Payer: Medicare Other | Admitting: Pulmonary Disease

## 2013-04-24 DIAGNOSIS — E559 Vitamin D deficiency, unspecified: Secondary | ICD-10-CM | POA: Insufficient documentation

## 2013-04-24 LAB — COMPREHENSIVE METABOLIC PANEL
ALT: 20 U/L (ref 0–35)
AST: 21 U/L (ref 0–37)
Albumin: 4 g/dL (ref 3.5–5.2)
Alkaline Phosphatase: 70 U/L (ref 39–117)
BUN: 12 mg/dL (ref 6–23)
CO2: 25 mEq/L (ref 19–32)
Calcium: 9.1 mg/dL (ref 8.4–10.5)
Chloride: 104 mEq/L (ref 96–112)
Creatinine, Ser: 0.9 mg/dL (ref 0.4–1.2)
GFR: 65.67 mL/min (ref >60.00)
Glucose, Bld: 100 mg/dL — ABNORMAL HIGH (ref 70–99)
Potassium: 4 mEq/L (ref 3.5–5.1)
Sodium: 138 mEq/L (ref 135–145)
Total Bilirubin: 1.1 mg/dL (ref 0.3–1.2)
Total Protein: 6.9 g/dL (ref 6.0–8.3)

## 2013-04-24 LAB — VITAMIN B12: Vitamin B-12: >1500 pg/mL — ABNORMAL HIGH (ref 211–911)

## 2013-04-24 LAB — LIPID PANEL
Cholesterol: 198 mg/dL (ref 0–200)
HDL: 56.3 mg/dL (ref >39.00)
LDL Cholesterol: 122 mg/dL — ABNORMAL HIGH (ref 0–99)
Total CHOL/HDL Ratio: 4
Triglycerides: 100 mg/dL (ref 0.0–149.0)
VLDL: 20 mg/dL (ref 0.0–40.0)

## 2013-04-24 LAB — VITAMIN D 25 HYDROXY (VIT D DEFICIENCY, FRACTURES): Vit D, 25-Hydroxy: 21 ng/mL — ABNORMAL LOW (ref 30–89)

## 2013-04-24 LAB — TSH: TSH: 1.3 u[IU]/mL (ref 0.35–5.50)

## 2013-04-24 MED ORDER — ERGOCALCIFEROL 1.25 MG (50000 UT) PO CAPS: 50000.0000 [IU] | ORAL_CAPSULE | ORAL | Status: DC

## 2013-04-24 NOTE — Addendum Note (Signed)
Addended by: Sherlene Shams on: 04/24/2013 12:37 PM   Modules accepted: Orders

## 2013-04-25 NOTE — Progress Notes (Signed)
Patient called confused about dosage on vit D clarified dosage for patient as written in note.

## 2013-04-30 ENCOUNTER — Ambulatory Visit: Payer: Self-pay | Admitting: Internal Medicine

## 2013-05-15 LAB — HM MAMMOGRAPHY

## 2013-05-21 ENCOUNTER — Ambulatory Visit: Payer: Self-pay | Admitting: General Practice

## 2013-05-21 ENCOUNTER — Telehealth: Payer: Self-pay

## 2013-05-21 LAB — MRSA PCR SCREENING

## 2013-05-21 LAB — URINALYSIS, COMPLETE
Bacteria: NONE SEEN
Bilirubin,UR: NEGATIVE
Blood: NEGATIVE
Glucose,UR: NEGATIVE mg/dL (ref 0–75)
Ketone: NEGATIVE
Leukocyte Esterase: NEGATIVE
Nitrite: NEGATIVE
Ph: 5 (ref 4.5–8.0)
Protein: NEGATIVE
RBC,UR: NONE SEEN /HPF (ref 0–5)
Specific Gravity: 1.009 (ref 1.003–1.030)
Squamous Epithelial: <1
WBC UR: 1 /HPF (ref 0–5)

## 2013-05-21 LAB — CBC
HCT: 44.1 % (ref 35.0–47.0)
HGB: 14.5 g/dL (ref 12.0–16.0)
MCH: 31.4 pg (ref 26.0–34.0)
MCHC: 33 g/dL (ref 32.0–36.0)
MCV: 95 fL (ref 80–100)
Platelet: 228 10*3/uL (ref 150–440)
RBC: 4.63 10*6/uL (ref 3.80–5.20)
RDW: 13.8 % (ref 11.5–14.5)
WBC: 6.9 10*3/uL (ref 3.6–11.0)

## 2013-05-21 LAB — BASIC METABOLIC PANEL
Anion Gap: 3 — ABNORMAL LOW (ref 7–16)
BUN: 13 mg/dL (ref 7–18)
Calcium, Total: 9.2 mg/dL (ref 8.5–10.1)
Chloride: 106 mmol/L (ref 98–107)
Co2: 29 mmol/L (ref 21–32)
Creatinine: 0.8 mg/dL (ref 0.60–1.30)
EGFR (African American): >60
EGFR (Non-African Amer.): >60
Glucose: 77 mg/dL (ref 65–99)
Osmolality: 275 (ref 275–301)
Potassium: 4.6 mmol/L (ref 3.5–5.1)
Sodium: 138 mmol/L (ref 136–145)

## 2013-05-21 LAB — SEDIMENTATION RATE: Erythrocyte Sed Rate: 8 mm/hr (ref 0–30)

## 2013-05-21 LAB — APTT: Activated PTT: 57.8 secs — ABNORMAL HIGH (ref 23.6–35.9)

## 2013-05-21 LAB — PROTIME-INR
INR: 1.4
Prothrombin Time: 17.3 secs — ABNORMAL HIGH (ref 11.5–14.7)

## 2013-05-21 NOTE — Telephone Encounter (Signed)
Pt called to say she is scheduled to have knee surg with Dr. Rhona Raider 06/04/13 Concerned b/;c HR is ranging from 56-103 and continues to fluctuate Asks if she needs appt prior to surg I will forward to Dr. Kirke Corin to get his advice and call pt back

## 2013-05-22 LAB — URINE CULTURE

## 2013-05-22 NOTE — Telephone Encounter (Signed)
Pt came by office with BP/HR readings.  Says she went to surgeon today, Dr. Ernest Pine, and he reassured her these readings were stable for surgery but suggested she let us know and fax letter stating OK to proceed with surgery to his office [phone]. He states anesthesia may need this clearance on their end.  BP/HR readings placed on Dr. Jari Sportsman desk for review (ange as follows:111/73-161/87, HR=54-105 BPM)

## 2013-05-22 NOTE — Telephone Encounter (Signed)
That should be fine. It is normal for the heart rate to fluctuate with A-fib. I already cleared her during last visit.

## 2013-05-23 NOTE — Telephone Encounter (Signed)
Will fax to Dr. Ernest Pine

## 2013-06-04 ENCOUNTER — Inpatient Hospital Stay: Payer: Self-pay | Admitting: General Practice

## 2013-06-04 HISTORY — PX: JOINT REPLACEMENT: SHX530

## 2013-06-04 LAB — APTT: Activated PTT: 37.8 secs — ABNORMAL HIGH (ref 23.6–35.9)

## 2013-06-04 LAB — PROTIME-INR
INR: 1
Prothrombin Time: 13 secs (ref 11.5–14.7)

## 2013-06-05 LAB — BASIC METABOLIC PANEL
Anion Gap: 7 (ref 7–16)
BUN: 10 mg/dL (ref 7–18)
Calcium, Total: 7.8 mg/dL — ABNORMAL LOW (ref 8.5–10.1)
Chloride: 108 mmol/L — ABNORMAL HIGH (ref 98–107)
Co2: 24 mmol/L (ref 21–32)
Creatinine: 0.84 mg/dL (ref 0.60–1.30)
EGFR (African American): >60
EGFR (Non-African Amer.): >60
Glucose: 115 mg/dL — ABNORMAL HIGH (ref 65–99)
Osmolality: 278 (ref 275–301)
Potassium: 4.1 mmol/L (ref 3.5–5.1)
Sodium: 139 mmol/L (ref 136–145)

## 2013-06-05 LAB — HEMOGLOBIN: HGB: 11.8 g/dL — ABNORMAL LOW (ref 12.0–16.0)

## 2013-06-05 LAB — PLATELET COUNT: Platelet: 178 10*3/uL (ref 150–440)

## 2013-06-06 LAB — BASIC METABOLIC PANEL
Anion Gap: 5 — ABNORMAL LOW (ref 7–16)
BUN: 5 mg/dL — ABNORMAL LOW (ref 7–18)
Calcium, Total: 7.9 mg/dL — ABNORMAL LOW (ref 8.5–10.1)
Chloride: 105 mmol/L (ref 98–107)
Co2: 27 mmol/L (ref 21–32)
Creatinine: 0.79 mg/dL (ref 0.60–1.30)
EGFR (African American): >60
EGFR (Non-African Amer.): >60
Glucose: 109 mg/dL — ABNORMAL HIGH (ref 65–99)
Osmolality: 272 (ref 275–301)
Potassium: 3.5 mmol/L (ref 3.5–5.1)
Sodium: 137 mmol/L (ref 136–145)

## 2013-06-06 LAB — PLATELET COUNT: Platelet: 163 10*3/uL (ref 150–440)

## 2013-06-06 LAB — HEMOGLOBIN: HGB: 11.5 g/dL — ABNORMAL LOW (ref 12.0–16.0)

## 2013-07-02 ENCOUNTER — Telehealth: Payer: Self-pay | Admitting: Internal Medicine

## 2013-07-02 NOTE — Telephone Encounter (Signed)
Pt went to her knee surgery dr and he wanted her to ask to get her Hemoglobin checked because she is feeling dizzy and light headed.

## 2013-07-02 NOTE — Telephone Encounter (Signed)
Spoke with pt, has occasional dizzy spells since knee surgery 3-4 weeks ago. Requesting to have labs drawn. Offered appointment to be seen for symptoms, pt declined. States she sees her cardiologist on 07/05/13 and will discuss with him then. Advised to call back with persistent or worsening symptoms.

## 2013-07-05 ENCOUNTER — Encounter: Payer: Self-pay | Admitting: Cardiovascular Disease

## 2013-07-05 ENCOUNTER — Ambulatory Visit (INDEPENDENT_AMBULATORY_CARE_PROVIDER_SITE_OTHER): Payer: Medicare Other | Admitting: Cardiovascular Disease

## 2013-07-05 VITALS — BP 139/70 | HR 80 | Ht 64.0 in | Wt 164.2 lb

## 2013-07-05 DIAGNOSIS — I10 Essential (primary) hypertension: Secondary | ICD-10-CM

## 2013-07-05 DIAGNOSIS — I4891 Unspecified atrial fibrillation: Secondary | ICD-10-CM

## 2013-07-05 DIAGNOSIS — I4819 Other persistent atrial fibrillation: Secondary | ICD-10-CM

## 2013-07-05 DIAGNOSIS — R000 Tachycardia, unspecified: Secondary | ICD-10-CM

## 2013-07-05 MED ORDER — METOPROLOL TARTRATE 50 MG PO TABS: 50.0000 mg | ORAL_TABLET | Freq: Two times a day (BID) | ORAL | Status: DC

## 2013-07-05 NOTE — Patient Instructions (Addendum)
Labs today.   Your physician has requested that you have an echocardiogram. Echocardiography is a painless test that uses sound waves to create images of your heart. It provides your doctor with information about the size and shape of your heart and how well your heart's chambers and valves are working. This procedure takes approximately one hour. There are no restrictions for this procedure.  Increase Metoprolol to 50 mg twice daily.   Follow up after echo.

## 2013-07-05 NOTE — Progress Notes (Signed)
HPI  [name] is a 77 year old female who is here today for a followup visit.  She has known history of atrial fibrillation/flutter status post multiple cardioversions in the past. Most recent echocardiogram was in April of  2013 which showed low normal LV systolic function with ejection fraction of 50-55%. 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 She had cardiac catheterization twice in the past without significant coronary artery disease.  She has been on Xarelto for anticoagulation.  She was seen early this year for palpitations and was found to be back in atypical atrial flutter. She didn't have significant symptoms related to that and thus I decided to pursue a rate control strategy. Amiodarone was stopped. I started her on metoprolol 25 mg twice daily and stopped amlodipine. She called our office a week later with persistent palpitations. Thus, I increased the dose of metoprolol to 50 mg twice daily. However, she had significant fatigue on that dose. Due to that, I decreased metoprolol back to 25 mg twice daily and added digoxin 0.125 mg once daily.  She underwent recent left knee replacement without complications. Since then ,  she has noticed increased heart rate with palpitations and fatigue. Heart rate has been running between 100-110.  Allergies  Allergen Reactions  . Biaxin (Clarithromycin)   . Codeine   . Iodine   . Warfarin Sodium      Current Outpatient Prescriptions on File Prior to Visit  Medication Sig Dispense Refill  . Cyanocobalamin (B-12) 2500 MCG TABS Take by mouth daily.      . digoxin (LANOXIN) 0.125 MG tablet Take 1 tablet (0.125 mg total) by mouth daily.  90 tablet  3  . ergocalciferol (DRISDOL) 50000 UNITS capsule Take 1 capsule (50,000 Units total) by mouth once a week.  4 capsule  1  . furosemide (LASIX) 20 MG tablet Take 20 mg by mouth daily as needed.      . metoprolol (LOPRESSOR) 50 MG tablet Take 0.5 tablets (25 mg  total) by mouth 2 (two) times daily.  60 tablet  6  . omeprazole (PRILOSEC) 20 MG capsule Take 1 capsule (20 mg total) by mouth 2 (two) times daily.  90 capsule  3  . Red Yeast Rice Extract (RED YEAST RICE PO) Take 1,200 mg by mouth 2 (two) times daily.       . Rivaroxaban (XARELTO) 20 MG TABS Take 1 tablet (20 mg total) by mouth daily.  35 tablet  0  . traMADol (ULTRAM) 50 MG tablet Take 50 mg by mouth as needed. For pain       . zoster vaccine live, PF, (ZOSTAVAX) 16109 UNT/0.65ML injection Inject 19,400 Units into the skin once.  1 each  0   No current facility-administered medications on file prior to visit.     Past Medical History  Diagnosis Date  . Atrial fibrillation     Paroxysmal, hx of  . Cardiomyopathy   . Dyspnea   . Headache(784.0)     chronic  . Hypertension   . Pre-syncope   . Chronic kidney disease     acute renal failure secondary to dehydration which is now resolved  . Light headedness     due to dehydration  . Atrial flutter 02/2011    s/p cardioversion   . Hyperlipidemia      Past Surgical History  Procedure Laterality Date  . Cardioversion      x 3  . Cholecystectomy    .  Appendectomy    . Multiple orthopedic procedures    . Mastectomy  1986    Bilateral with silicone  breast implants, s/p saline replacements  . Cardiac catheterization    . Combined augmentation mammaplasty and abdominoplasty    . Abdominal hysterectomy  1990  . Total knee arthroplasty Left      Family History  Problem Relation Age of Onset  . Heart disease Mother   . Cancer Father     stomach  . Cancer Sister     breast  . Cancer Maternal Grandmother     breast  . Cancer Sister     breast  . Diabetes Neg Hx   . Heart disease Son     found at autopsy     History   Social History  . Marital Status: Married    Spouse Name: N/A    Number of Children: 1  . Years of Education: N/A   Occupational History  .     Social History Main Topics  . Smoking status:  Never Smoker   . Smokeless tobacco: Never Used  . Alcohol Use: No  . Drug Use: No  . Sexually Active: Not on file   Other Topics Concern  . Not on file   Social History Narrative  . No narrative on file     PHYSICAL EXAM   BP 139/70  Pulse 80  Ht 5\' 4"  (1.626 m)  Wt 164 lb 4 oz (74.503 kg)  BMI 28.18 kg/m2 Constitutional: She is oriented to person, place, and time. She appears well-developed and well-nourished. No distress.  HENT: No nasal discharge.  Head: Normocephalic and atraumatic.  Eyes: Pupils are equal and round. Right eye exhibits no discharge. Left eye exhibits no discharge.  Neck: Normal range of motion. Neck supple. No JVD present. No thyromegaly present.  Cardiovascular: Normal rate, irregular rhythm, normal heart sounds. Exam reveals no gallop and no friction rub. No murmur heard.  Pulmonary/Chest: Effort normal and breath sounds normal. No stridor. No respiratory distress. She has no wheezes. She has no rales. She exhibits no tenderness.  Abdominal: Soft. Bowel sounds are normal. She exhibits no distension. There is no tenderness. There is no rebound and no guarding.  Musculoskeletal: Normal range of motion. She exhibits trace edema and no tenderness.  Neurological: She is alert and oriented to person, place, and time. Coordination normal.  Skin: Skin is warm and dry. No rash noted. She is not diaphoretic. No erythema. No pallor.  Psychiatric: She has a normal mood and affect. Her behavior is normal. Judgment and thought content normal.     EKG: Atrial flutter-fibrillation  -Right bundle branch block.   -  Negative T-waves  -Possible  Inferior  ischemia.   ABNORMAL   ASSESSMENT AND PLAN

## 2013-07-05 NOTE — Assessment & Plan Note (Signed)
Blood pressure has been reasonably controlled on metoprolol alone.

## 2013-07-05 NOTE — Assessment & Plan Note (Signed)
She seems to be more tachycardic than usual and appears to be symptomatic. She is having increased dyspnea. Given her recent knee surgery, I will check CBC and basic metabolic profile to make sure there is no significant anemia or other underlying metabolic abnormalities. I will also obtain an echocardiogram given her previous history of tachycardia-induced cardiomyopathy. Increase metoprolol to 50 mg twice daily and continue small dose digoxin. If she remains symptomatic, we might need to switch her to an antiarrhythmic medication and attempt cardioversion again.

## 2013-07-06 LAB — CBC WITH DIFFERENTIAL/PLATELET
Basophils Absolute: 0 10*3/uL (ref 0.0–0.2)
Basos: 0 % (ref 0–3)
Eos: 1 % (ref 0–5)
Eosinophils Absolute: 0.1 10*3/uL (ref 0.0–0.4)
HCT: 40.5 % (ref 34.0–46.6)
Hemoglobin: 13.2 g/dL (ref 11.1–15.9)
Immature Grans (Abs): 0 10*3/uL (ref 0.0–0.1)
Immature Granulocytes: 0 % (ref 0–2)
Lymphocytes Absolute: 1.1 10*3/uL (ref 0.7–3.1)
Lymphs: 12 % — ABNORMAL LOW (ref 14–46)
MCH: 31.1 pg (ref 26.6–33.0)
MCHC: 32.6 g/dL (ref 31.5–35.7)
MCV: 95 fL (ref 79–97)
Monocytes Absolute: 0.4 10*3/uL (ref 0.1–0.9)
Monocytes: 5 % (ref 4–12)
Neutrophils Absolute: 7.4 10*3/uL — ABNORMAL HIGH (ref 1.4–7.0)
Neutrophils Relative %: 82 % — ABNORMAL HIGH (ref 40–74)
RBC: 4.25 x10E6/uL (ref 3.77–5.28)
RDW: 13.7 % (ref 12.3–15.4)
WBC: 9.1 10*3/uL (ref 3.4–10.8)

## 2013-07-06 LAB — BASIC METABOLIC PANEL
BUN/Creatinine Ratio: 17 (ref 11–26)
BUN: 14 mg/dL (ref 8–27)
CO2: 22 mmol/L (ref 18–29)
Calcium: 9.5 mg/dL (ref 8.6–10.2)
Chloride: 101 mmol/L (ref 97–108)
Creatinine, Ser: 0.82 mg/dL (ref 0.57–1.00)
GFR calc Af Amer: 81 mL/min/{1.73_m2} (ref >59)
GFR calc non Af Amer: 70 mL/min/{1.73_m2} (ref >59)
Glucose: 113 mg/dL — ABNORMAL HIGH (ref 65–99)
Potassium: 5 mmol/L (ref 3.5–5.2)
Sodium: 137 mmol/L (ref 134–144)

## 2013-07-09 ENCOUNTER — Telehealth: Payer: Self-pay

## 2013-07-09 NOTE — Telephone Encounter (Signed)
Pt states she has echo on this Friday, 25th, states she is continue to get weaker and her HR is 115, 104, 59 all this a.m. Pt states dr Kirke Corin has increased her Meetroprolol. Please call.

## 2013-07-09 NOTE — Telephone Encounter (Signed)
Returned call to pt, Dr Kirke Corin increased Metoprolol to 50mg  BID on 07/05/13.  Advised pt weakness and fatigue is a common side effect of this medication, but to give the increased dosage amount some time for her body to adjust to.  Pt reports her HR fluctuating from 115 to 59, but does not remain significantly elevated for any extended amount of time.  Pt agrees to continue to monitor HR and call with readings if they remain elevated on increased dosage of Metoprolol. Advised pt to keep Echo appt on 07/13/13 and bring in log of HR to visit for review.  Pt agrees.

## 2013-07-13 ENCOUNTER — Other Ambulatory Visit (INDEPENDENT_AMBULATORY_CARE_PROVIDER_SITE_OTHER): Payer: Medicare Other

## 2013-07-13 ENCOUNTER — Other Ambulatory Visit: Payer: Self-pay

## 2013-07-13 DIAGNOSIS — I4891 Unspecified atrial fibrillation: Secondary | ICD-10-CM

## 2013-07-20 ENCOUNTER — Ambulatory Visit (INDEPENDENT_AMBULATORY_CARE_PROVIDER_SITE_OTHER): Payer: Medicare Other | Admitting: Cardiovascular Disease

## 2013-07-20 ENCOUNTER — Encounter: Payer: Self-pay | Admitting: Cardiovascular Disease

## 2013-07-20 VITALS — BP 120/70 | HR 94 | Ht 64.0 in | Wt 163.2 lb

## 2013-07-20 DIAGNOSIS — Z01810 Encounter for preprocedural cardiovascular examination: Secondary | ICD-10-CM

## 2013-07-20 DIAGNOSIS — I10 Essential (primary) hypertension: Secondary | ICD-10-CM

## 2013-07-20 DIAGNOSIS — I43 Cardiomyopathy in diseases classified elsewhere: Secondary | ICD-10-CM

## 2013-07-20 DIAGNOSIS — I428 Other cardiomyopathies: Secondary | ICD-10-CM

## 2013-07-20 DIAGNOSIS — I4891 Unspecified atrial fibrillation: Secondary | ICD-10-CM

## 2013-07-20 DIAGNOSIS — I499 Cardiac arrhythmia, unspecified: Secondary | ICD-10-CM

## 2013-07-20 DIAGNOSIS — R000 Tachycardia, unspecified: Secondary | ICD-10-CM

## 2013-07-20 DIAGNOSIS — Z7901 Long term (current) use of anticoagulants: Secondary | ICD-10-CM

## 2013-07-20 DIAGNOSIS — I4819 Other persistent atrial fibrillation: Secondary | ICD-10-CM

## 2013-07-20 MED ORDER — AMIODARONE HCL 200 MG PO TABS: 200.0000 mg | ORAL_TABLET | Freq: Every day | ORAL | Status: DC

## 2013-07-20 NOTE — Patient Instructions (Addendum)
Stop Digoxin.  Start Amiodarone 200 mg twice daily.  Continue to monitor blood pressure and heart rate.   Schedule cardioversion in 2-3 weeks on 08/10/13 at [facility].  Pre-procedure labwork and EKG on 08/03/13 at 9:00am.

## 2013-07-20 NOTE — Progress Notes (Signed)
HPI  [name] is a 77 year old female who is here today for a followup visit.  She has known history of atrial fibrillation/flutter status post multiple cardioversions in the past. Most recent echocardiogram was in April of  2013 which showed low normal LV systolic function with ejection fraction of 50-55%. 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 She had cardiac catheterization twice in the past without significant coronary artery disease.  She has been on Xarelto for anticoagulation.  She was seen early this year for palpitations and was found to be back in atypical atrial flutter. She didn't have significant symptoms related to that and thus I decided to pursue a rate control strategy. Amiodarone was stopped. I started her on metoprolol 25 mg twice daily and stopped amlodipine. She called our office a week later with persistent palpitations. Thus, I increased the dose of metoprolol to 50 mg twice daily. However, she had significant fatigue on that dose. Due to that, I decreased metoprolol back to 25 mg twice daily and added digoxin 0.125 mg once daily.  She underwent recent left knee replacement without complications. Since then ,  she has noticed increased heart rate with palpitations and fatigue. Heart rate has been running between 100-110. I increase metoprolol back to 50 mg twice daily. An echocardiogram was done which showed an ejection fraction of 50-55% with mild to moderately dilated left atrium. She continues diffuse poorly with heart rate going up to 120 with activities.  Allergies  Allergen Reactions  . Biaxin (Clarithromycin)   . Codeine   . Iodine   . Warfarin Sodium      Current Outpatient Prescriptions on File Prior to Visit  Medication Sig Dispense Refill  . Cyanocobalamin (B-12) 2500 MCG TABS Take by mouth daily.      . digoxin (LANOXIN) 0.125 MG tablet Take 1 tablet (0.125 mg total) by mouth daily.  90 tablet  3  . ergocalciferol  (DRISDOL) 50000 UNITS capsule Take 1 capsule (50,000 Units total) by mouth once a week.  4 capsule  1  . furosemide (LASIX) 20 MG tablet Take 20 mg by mouth daily as needed.      . metoprolol (LOPRESSOR) 50 MG tablet Take 1 tablet (50 mg total) by mouth 2 (two) times daily.  60 tablet  6  . omeprazole (PRILOSEC) 20 MG capsule Take 1 capsule (20 mg total) by mouth 2 (two) times daily.  90 capsule  3  . Red Yeast Rice Extract (RED YEAST RICE PO) Take 1,200 mg by mouth 2 (two) times daily.       . Rivaroxaban (XARELTO) 20 MG TABS Take 1 tablet (20 mg total) by mouth daily.  35 tablet  0  . traMADol (ULTRAM) 50 MG tablet Take 50 mg by mouth as needed. For pain       . zoster vaccine live, PF, (ZOSTAVAX) 41324 UNT/0.65ML injection Inject 19,400 Units into the skin once.  1 each  0   No current facility-administered medications on file prior to visit.     Past Medical History  Diagnosis Date  . Atrial fibrillation     Paroxysmal, hx of  . Cardiomyopathy   . Dyspnea   . Headache(784.0)     chronic  . Hypertension   . Pre-syncope   . Chronic kidney disease     acute renal failure secondary to dehydration which is now resolved  . Light headedness     due to dehydration  . Atrial  flutter 02/2011    s/p cardioversion   . Hyperlipidemia      Past Surgical History  Procedure Laterality Date  . Cardioversion      x 3  . Cholecystectomy    . Appendectomy    . Multiple orthopedic procedures    . Mastectomy  1986    Bilateral with silicone  breast implants, s/p saline replacements  . Cardiac catheterization    . Combined augmentation mammaplasty and abdominoplasty    . Abdominal hysterectomy  1990  . Total knee arthroplasty Left      Family History  Problem Relation Age of Onset  . Heart disease Mother   . Cancer Father     stomach  . Cancer Sister     breast  . Cancer Maternal Grandmother     breast  . Cancer Sister     breast  . Diabetes Neg Hx   . Heart disease Son      found at autopsy     History   Social History  . Marital Status: Married    Spouse Name: N/A    Number of Children: 1  . Years of Education: N/A   Occupational History  .     Social History Main Topics  . Smoking status: Never Smoker   . Smokeless tobacco: Never Used  . Alcohol Use: No  . Drug Use: No  . Sexually Active: Not on file   Other Topics Concern  . Not on file   Social History Narrative  . No narrative on file     PHYSICAL EXAM   BP 120/70  Pulse 94  Ht 5\' 4"  (1.626 m)  Wt 163 lb 4 oz (74.05 kg)  BMI 28.01 kg/m2 Constitutional: She is oriented to person, place, and time. She appears well-developed and well-nourished. No distress.  HENT: No nasal discharge.  Head: Normocephalic and atraumatic.  Eyes: Pupils are equal and round. Right eye exhibits no discharge. Left eye exhibits no discharge.  Neck: Normal range of motion. Neck supple. No JVD present. No thyromegaly present.  Cardiovascular: Normal rate, irregular rhythm, normal heart sounds. Exam reveals no gallop and no friction rub. No murmur heard.  Pulmonary/Chest: Effort normal and breath sounds normal. No stridor. No respiratory distress. She has no wheezes. She has no rales. She exhibits no tenderness.  Abdominal: Soft. Bowel sounds are normal. She exhibits no distension. There is no tenderness. There is no rebound and no guarding.  Musculoskeletal: Normal range of motion. She exhibits trace edema and no tenderness.  Neurological: She is alert and oriented to person, place, and time. Coordination normal.  Skin: Skin is warm and dry. No rash noted. She is not diaphoretic. No erythema. No pallor.  Psychiatric: She has a normal mood and affect. Her behavior is normal. Judgment and thought content normal.     EKG: Atrial fibrillation  -Right bundle branch block.   ABNORMAL   ASSESSMENT AND PLAN

## 2013-07-20 NOTE — Assessment & Plan Note (Signed)
Recent ejection fraction was 50-55% on echo which is reassuring.

## 2013-07-20 NOTE — Assessment & Plan Note (Signed)
She continues to be symptomatic in spite of  increasing the dose of metoprolol to 50 mg twice daily which made her more tired. Heart rate is easily going up to 110 - 120 beats per minute with activities. I discussed with her different management options including continued rate control versus an attempt of restoring normal sinus rhythm again. We discussed the risks and benefits and decided to proceed with the later.  Thus, I stopped digoxin. I will resume amiodarone 200 mg twice daily and plan cardioversion in 2-3 weeks. She is to continue monitoring heart rate and blood pressure.  Continue anticoagulation with Xarelto.

## 2013-07-20 NOTE — Assessment & Plan Note (Signed)
Blood pressure tends to run low and does not allow up titration of rate control medications.

## 2013-07-23 ENCOUNTER — Other Ambulatory Visit: Payer: Self-pay | Admitting: *Deleted

## 2013-07-23 MED ORDER — AMIODARONE HCL 200 MG PO TABS: 200.0000 mg | ORAL_TABLET | Freq: Two times a day (BID) | ORAL | Status: DC

## 2013-07-23 NOTE — Telephone Encounter (Signed)
Refilled Amidarone sent to [facility] pharmacy.

## 2013-08-03 ENCOUNTER — Ambulatory Visit (INDEPENDENT_AMBULATORY_CARE_PROVIDER_SITE_OTHER): Payer: Medicare Other

## 2013-08-03 VITALS — BP 145/72 | HR 85 | Ht 64.0 in | Wt 166.4 lb

## 2013-08-03 DIAGNOSIS — Z7901 Long term (current) use of anticoagulants: Secondary | ICD-10-CM

## 2013-08-03 DIAGNOSIS — I499 Cardiac arrhythmia, unspecified: Secondary | ICD-10-CM

## 2013-08-03 DIAGNOSIS — Z01810 Encounter for preprocedural cardiovascular examination: Secondary | ICD-10-CM

## 2013-08-03 DIAGNOSIS — I4891 Unspecified atrial fibrillation: Secondary | ICD-10-CM

## 2013-08-03 DIAGNOSIS — I4819 Other persistent atrial fibrillation: Secondary | ICD-10-CM

## 2013-08-04 LAB — CBC WITH DIFFERENTIAL/PLATELET
Basophils Absolute: 0 10*3/uL (ref 0.0–0.2)
Basos: 0 % (ref 0–3)
Eos: 1 % (ref 0–5)
Eosinophils Absolute: 0.1 10*3/uL (ref 0.0–0.4)
HCT: 40.5 % (ref 34.0–46.6)
Hemoglobin: 12.7 g/dL (ref 11.1–15.9)
Immature Grans (Abs): 0 10*3/uL (ref 0.0–0.1)
Immature Granulocytes: 0 % (ref 0–2)
Lymphocytes Absolute: 1.2 10*3/uL (ref 0.7–3.1)
Lymphs: 18 % (ref 14–46)
MCH: 30 pg (ref 26.6–33.0)
MCHC: 31.4 g/dL — ABNORMAL LOW (ref 31.5–35.7)
MCV: 96 fL (ref 79–97)
Monocytes Absolute: 0.4 10*3/uL (ref 0.1–0.9)
Monocytes: 6 % (ref 4–12)
Neutrophils Absolute: 5.1 10*3/uL (ref 1.4–7.0)
Neutrophils Relative %: 75 % — ABNORMAL HIGH (ref 40–74)
RBC: 4.23 x10E6/uL (ref 3.77–5.28)
RDW: 13.8 % (ref 12.3–15.4)
WBC: 6.9 10*3/uL (ref 3.4–10.8)

## 2013-08-04 LAB — BASIC METABOLIC PANEL
BUN/Creatinine Ratio: 16 (ref 11–26)
BUN: 14 mg/dL (ref 8–27)
CO2: 23 mmol/L (ref 18–29)
Calcium: 9.1 mg/dL (ref 8.6–10.2)
Chloride: 105 mmol/L (ref 97–108)
Creatinine, Ser: 0.86 mg/dL (ref 0.57–1.00)
GFR calc Af Amer: 76 mL/min/{1.73_m2} (ref >59)
GFR calc non Af Amer: 66 mL/min/{1.73_m2} (ref >59)
Glucose: 107 mg/dL — ABNORMAL HIGH (ref 65–99)
Potassium: 5 mmol/L (ref 3.5–5.2)
Sodium: 140 mmol/L (ref 134–144)

## 2013-08-04 LAB — PROTIME-INR
INR: 1.3 — ABNORMAL HIGH (ref 0.8–1.2)
Prothrombin Time: 13 s — ABNORMAL HIGH (ref 9.1–12.0)

## 2013-08-10 ENCOUNTER — Ambulatory Visit: Payer: Self-pay | Admitting: Cardiovascular Disease

## 2013-08-10 DIAGNOSIS — I4891 Unspecified atrial fibrillation: Secondary | ICD-10-CM

## 2013-08-13 ENCOUNTER — Telehealth: Payer: Self-pay | Admitting: *Deleted

## 2013-08-13 NOTE — Telephone Encounter (Signed)
ptcb and I advised her per Dr. Mariah Milling to let Dr. Kirke Corin handle about the metoprolol dosing. Pt states she would like to see Dr. Kirke Corin tomorrow, since this has been going on since the weekend, I advised w/have Jasmine December cb to fit pt in

## 2013-08-13 NOTE — Telephone Encounter (Signed)
She had a procedure on Friday and her bp keeps going up. Please advise

## 2013-08-13 NOTE — Telephone Encounter (Signed)
ptcb from her call earlier today about HR dropping. Pt states Dr. Kirke Corin said to stay on Amiodarone 200 mg bid, started metoprolol 25 bid. Pt states 8/25 metoprolol 12.5 mg bp 7 am 160/77 hr 58, 9 am 159/78 hr 73, then pt took 25 mg metoprolol due to increase in bp so at 11 am 113/63 hr 43. Pt states if she takes metoprolol 12.5 mg bid BP then goes to high, HR ok, but if she takes 25 mg bp is good but HR too low. Advised Dr. Kirke Corin is not in the office today but I will d/w Dr. Mariah Milling about what to do and I will cb later today. Pt verbalized understanding to Plan of Care .

## 2013-08-13 NOTE — Telephone Encounter (Signed)
The patient will come in tomorrow at 2:30 to see [place], [place].

## 2013-08-13 NOTE — Telephone Encounter (Signed)
ptcb from her call earlier today about HR dropping. Pt states Dr. Arida said to stay on Amiodarone 200 mg bid, started metoprolol 25 bid. Pt states 8/25 metoprolol 12.5 mg bp 7 am 160/77 hr 58, 9 am 159/78 hr 73, then pt took 25 mg metoprolol due to increase in bp so at 11 am 113/63 hr 43. Pt states if she takes metoprolol 12.5 mg bid BP then goes to high, HR ok, but if she takes 25 mg bp is good but HR too low. Advised Dr. Arida is not in the office today but I will d/w Dr. Gollan about what to do and I will cb later today. Pt verbalized understanding to Plan of Care . 

## 2013-08-14 ENCOUNTER — Encounter: Payer: Self-pay | Admitting: Cardiovascular Disease

## 2013-08-14 ENCOUNTER — Ambulatory Visit: Payer: Medicare Other | Admitting: Physician Assistant

## 2013-08-14 ENCOUNTER — Ambulatory Visit (INDEPENDENT_AMBULATORY_CARE_PROVIDER_SITE_OTHER): Payer: Medicare Other | Admitting: Cardiovascular Disease

## 2013-08-14 VITALS — BP 118/60 | HR 55 | Ht 64.0 in | Wt 165.8 lb

## 2013-08-14 DIAGNOSIS — I4891 Unspecified atrial fibrillation: Secondary | ICD-10-CM

## 2013-08-14 DIAGNOSIS — I10 Essential (primary) hypertension: Secondary | ICD-10-CM

## 2013-08-14 DIAGNOSIS — I4819 Other persistent atrial fibrillation: Secondary | ICD-10-CM

## 2013-08-14 MED ORDER — AMLODIPINE BESYLATE 5 MG PO TABS: 5.0000 mg | ORAL_TABLET | Freq: Every day | ORAL | Status: DC

## 2013-08-14 NOTE — Progress Notes (Signed)
HPI  [name] [name] is a 77 year old female who is here today for a followup visit.  She has known history of atrial fibrillation/flutter status post multiple cardioversions in the past. Most recent echocardiogram was in July of 2014 showed low normal LV systolic function with ejection fraction of 50-55%. 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 She had cardiac catheterization twice in the past without significant coronary artery disease.  She has been on Xarelto for anticoagulation.  She underwent cardioversion last week without complications. Since then, she has noted significant bradycardia with fatigue. She is on amiodarone 200 mg twice daily as well as metoprolol which was decreased to 25 mg twice daily after cardioversion. She cut down the dose to 12 a half milligram twice daily on few occasions and heart rate continued to be less than 55 beats per minute and occasionally went up to the high 40s. She feels fatigued with increased headache.  Allergies  Allergen Reactions  . Biaxin [Clarithromycin]   . Codeine   . Iodine   . Warfarin Sodium      Current Outpatient Prescriptions on File Prior to Visit  Medication Sig Dispense Refill  . amiodarone (PACERONE) 200 MG tablet Take 1 tablet (200 mg total) by mouth 2 (two) times daily.  60 tablet  3  . Cyanocobalamin (B-12) 2500 MCG TABS Take by mouth daily.      . furosemide (LASIX) 20 MG tablet Take 20 mg by mouth daily as needed.      [name] omeprazole (PRILOSEC) 20 MG capsule Take 1 capsule (20 mg total) by mouth 2 (two) times daily.  90 capsule  3  . Red Yeast Rice Extract (RED YEAST RICE PO) Take 1,200 mg by mouth 2 (two) times daily.       . Rivaroxaban (XARELTO) 20 MG TABS Take 1 tablet (20 mg total) by mouth daily.  35 tablet  0  . traMADol (ULTRAM) 50 MG tablet Take 50 mg by mouth as needed. For pain       . zoster vaccine live, PF, (ZOSTAVAX) 40981 UNT/0.65ML injection Inject 19,400 Units into the  skin once.  1 each  0   No current facility-administered medications on file prior to visit.     Past Medical History  Diagnosis Date  . Atrial fibrillation     Paroxysmal, hx of  . Cardiomyopathy   . Dyspnea   . Headache(784.0)     chronic  . Hypertension   . Pre-syncope   . Chronic kidney disease     acute renal failure secondary to dehydration which is now resolved  . Light headedness     due to dehydration  . Atrial flutter 02/2011    s/p cardioversion   . Hyperlipidemia      Past Surgical History  Procedure Laterality Date  . Cardioversion      x 3  . Cholecystectomy    . Appendectomy    . Multiple orthopedic procedures    . Mastectomy  1986    Bilateral with silicone  breast implants, s/p saline replacements  . Cardiac catheterization    . Combined augmentation mammaplasty and abdominoplasty    . Abdominal hysterectomy  1990  . Total knee arthroplasty Left   . Cardioversion       Family History  Problem Relation Age of Onset  . Heart disease Mother   . Cancer Father     stomach  . Cancer Sister     breast  .  Cancer Maternal Grandmother     breast  . Cancer Sister     breast  . Diabetes Neg Hx   . Heart disease Son     found at autopsy     History   Social History  . Marital Status: Married    Spouse Name: N/A    Number of Children: 1  . Years of Education: N/A   Occupational History  .     Social History Main Topics  . Smoking status: Never Smoker   . Smokeless tobacco: Never Used  . Alcohol Use: No  . Drug Use: No  . Sexual Activity: Not on file   Other Topics Concern  . Not on file   Social History Narrative  . No narrative on file     PHYSICAL EXAM   BP 118/60  Pulse 55  Ht 5\' 4"  (1.626 m)  Wt 165 lb 12 oz (75.184 kg)  BMI 28.44 kg/m2 Constitutional: She is oriented to person, place, and time. She appears well-developed and well-nourished. No distress.  HENT: No nasal discharge.  Head: Normocephalic and  atraumatic.  Eyes: Pupils are equal and round. Right eye exhibits no discharge. Left eye exhibits no discharge.  Neck: Normal range of motion. Neck supple. No JVD present. No thyromegaly present.  Cardiovascular: Normal rate, irregular rhythm, normal heart sounds. Exam reveals no gallop and no friction rub. No murmur heard.  Pulmonary/Chest: Effort normal and breath sounds normal. No stridor. No respiratory distress. She has no wheezes. She has no rales. She exhibits no tenderness.  Abdominal: Soft. Bowel sounds are normal. She exhibits no distension. There is no tenderness. There is no rebound and no guarding.  Musculoskeletal: Normal range of motion. She exhibits trace edema and no tenderness.  Neurological: She is alert and oriented to person, place, and time. Coordination normal.  Skin: Skin is warm and dry. No rash noted. She is not diaphoretic. No erythema. No pallor.  Psychiatric: She has a normal mood and affect. Her behavior is normal. Judgment and thought content normal.     EKG: Sinus  Bradycardia  -Right bundle branch block.   -  Nonspecific T-abnormality.   ABNORMAL   ASSESSMENT AND PLAN

## 2013-08-14 NOTE — Patient Instructions (Addendum)
Stop Metoprolol.  Start Amlodipine 5 mg once daily.  Continue other medications.  Follow up in 3 months (reschedule September appointment).

## 2013-08-14 NOTE — Assessment & Plan Note (Signed)
She is maintaining in sinus rhythm after recent cardioversion. However, she is bradycardic and symptomatic. Thus, I recommend stopping metoprolol. Continue the same dose of amiodarone twice daily. I might consider decreasing the dose of amiodarone to 200 mg once daily in few months. Continue long-term anticoagulation. She will need a followup liver and thyroid profile in few months.

## 2013-08-14 NOTE — Assessment & Plan Note (Signed)
Blood pressure is going up after decreasing the dose of metoprolol. Thus, I recommend resuming amlodipine at 5 mg once daily. Continue to monitor blood pressure.

## 2013-08-17 ENCOUNTER — Telehealth: Payer: Self-pay | Admitting: *Deleted

## 2013-08-17 DIAGNOSIS — I10 Essential (primary) hypertension: Secondary | ICD-10-CM

## 2013-08-17 MED ORDER — LISINOPRIL 20 MG PO TABS: 20.0000 mg | ORAL_TABLET | Freq: Every day | ORAL | Status: DC

## 2013-08-17 NOTE — Telephone Encounter (Signed)
Patient calling her bp is going up. Wants to know what to do?

## 2013-08-17 NOTE — Telephone Encounter (Signed)
Informed pt that Dr. Kirke Corin wants to add Lisinopril 20 mg 1 tablet at night.  She is in agreement with this plan.

## 2013-08-17 NOTE — Telephone Encounter (Signed)
mmk

## 2013-08-17 NOTE — Telephone Encounter (Signed)
Add Lisinopril 20 mg at night.

## 2013-08-17 NOTE — Telephone Encounter (Signed)
Spoke w/ pt.  She states that since her visit on 8/26 with Dr. Kirke Corin, her blood pressure is climbing again.  Per 8/26 note, "Blood pressure is going up after decreasing the dose of metoprolol. Thus, I recommend resuming amlodipine at 5 mg once daily. Continue to monitor blood pressure." She doesn't feel that the amlodipine is "holding her overnight" as her BP as been 155/85, 165/81, 156/84 in the mornings.  Pt states that "may not be high to you, but it is for me and I wake up sweating". Would like to know if there are any other options for controlling her BP?  Thank you.

## 2013-08-30 ENCOUNTER — Encounter: Payer: Medicare Other | Admitting: Cardiovascular Disease

## 2013-09-17 ENCOUNTER — Telehealth: Payer: Self-pay | Admitting: *Deleted

## 2013-09-17 NOTE — Telephone Encounter (Signed)
LMOM to have patient pick up samples of xarelto 20 mg.  

## 2013-09-17 NOTE — Telephone Encounter (Signed)
Patient needs samples of Xalerto 20mg 

## 2013-09-21 ENCOUNTER — Ambulatory Visit (INDEPENDENT_AMBULATORY_CARE_PROVIDER_SITE_OTHER): Payer: Medicare Other | Admitting: Internal Medicine

## 2013-09-21 ENCOUNTER — Encounter: Payer: Self-pay | Admitting: Internal Medicine

## 2013-09-21 VITALS — BP 138/70 | HR 59 | Temp 97.7°F | Resp 14 | Wt 166.5 lb

## 2013-09-21 DIAGNOSIS — F4329 Adjustment disorder with other symptoms: Secondary | ICD-10-CM

## 2013-09-21 DIAGNOSIS — F4321 Adjustment disorder with depressed mood: Secondary | ICD-10-CM

## 2013-09-21 DIAGNOSIS — E860 Dehydration: Secondary | ICD-10-CM

## 2013-09-21 DIAGNOSIS — R1011 Right upper quadrant pain: Secondary | ICD-10-CM

## 2013-09-21 DIAGNOSIS — F4381 Prolonged grief disorder: Secondary | ICD-10-CM

## 2013-09-21 DIAGNOSIS — G8929 Other chronic pain: Secondary | ICD-10-CM | POA: Insufficient documentation

## 2013-09-21 LAB — POCT URINALYSIS DIPSTICK
Blood, UA: NEGATIVE
Glucose, UA: NEGATIVE
Nitrite, UA: NEGATIVE
Protein, UA: NEGATIVE
Spec Grav, UA: >=1.03
Urobilinogen, UA: 0.2
pH, UA: 5

## 2013-09-21 LAB — COMPREHENSIVE METABOLIC PANEL
ALT: 17 U/L (ref 0–35)
AST: 20 U/L (ref 0–37)
Albumin: 4.1 g/dL (ref 3.5–5.2)
Alkaline Phosphatase: 68 U/L (ref 39–117)
BUN: 17 mg/dL (ref 6–23)
CO2: 27 mEq/L (ref 19–32)
Calcium: 9.7 mg/dL (ref 8.4–10.5)
Chloride: 103 mEq/L (ref 96–112)
Creat: 1.12 mg/dL — ABNORMAL HIGH (ref 0.50–1.10)
Glucose, Bld: 86 mg/dL (ref 70–99)
Potassium: 4 mEq/L (ref 3.5–5.3)
Sodium: 137 mEq/L (ref 135–145)
Total Bilirubin: 0.7 mg/dL (ref 0.3–1.2)
Total Protein: 7.2 g/dL (ref 6.0–8.3)

## 2013-09-21 NOTE — Patient Instructions (Addendum)
I am ordering an ultrasound to evaluate your abdomen   If the ultrasound and labs are normal,  i will recommend a CT of the abdomen and pelvis for further evaluation  In the meantime , please make note of whether your pain  is aggravated by eating,  Stooling,  Or activity

## 2013-09-21 NOTE — Progress Notes (Signed)
**Note De-Identified [name] Obfuscation** Patient ID: [name] [name], female   DOB: 1936-01-05, 77 y.o.   MRN: [id number]  Patient Active Problem List   Diagnosis Date Noted  . Dehydration 09/23/2013  . Chronic RUQ pain 09/21/2013  . Tachycardia induced cardiomyopathy 07/20/2013  . Unspecified vitamin D deficiency 04/24/2013  . Family history of breast cancer in female 04/23/2013  . Routine general medical examination at a health care facility 04/23/2013  . Preop cardiovascular exam 04/09/2013  . Knee pain, bilateral 10/04/2012  . Inguinal pain of both sides 10/04/2012  . Obesity 06/30/2012  . Fatigue 06/22/2012  . History of [place] spotted fever 06/22/2012  . Persistent atrial fibrillation 05/09/2012  . Anxiety and depression 05/09/2012  . TACHYCARDIA 02/01/2011  . HYPERLIPIDEMIA-MIXED 07/08/2010  . HYPERTENSION 03/05/2009  . HEADACHE, CHRONIC 03/05/2009  . DYSPNEA 03/05/2009    Subjective:  CC:   Chief Complaint  Patient presents with  . Acute Visit    Pain under right rib cage.     HPI:   [name] [name] a 77 y.o. female who presents Pain under rib cage on the right side, feels like a fist balled up under her ribcage.  .  No history of fall, blunt trauma or strenuous activity.  Aggravated by pushing on area but not by twisting or bending over.   Had sugery on her left knee  On June 16th and is not sure if pain was present prior to surgery.  The pain is not severe and has not become worse but has not improved.     Past Medical History  Diagnosis Date  . Atrial fibrillation     Paroxysmal, hx of  . Cardiomyopathy   . Dyspnea   . Headache(784.0)     chronic  . Hypertension   . Pre-syncope   . Chronic kidney disease     acute renal failure secondary to dehydration which is now resolved  . Light headedness     due to dehydration  . Atrial flutter 02/2011    s/p cardioversion   . Hyperlipidemia     Past Surgical History  Procedure Laterality Date  . Cardioversion      x 3  . Cholecystectomy     . Appendectomy    . Multiple orthopedic procedures    . Mastectomy  1986    Bilateral with silicone  breast implants, s/p saline replacements  . Cardiac catheterization    . Combined augmentation mammaplasty and abdominoplasty    . Abdominal hysterectomy  1990  . Total knee arthroplasty Left   . Cardioversion         The following portions of the patient's history were reviewed and updated as appropriate: Allergies, current medications, and problem list.    Review of Systems:   12 Pt  review of systems was negative except those addressed in the HPI,     History   Social History  . Marital Status: Married    Spouse Name: N/A    Number of Children: 1  . Years of Education: N/A   Occupational History  .     Social History Main Topics  . Smoking status: Never Smoker   . Smokeless tobacco: Never Used  . Alcohol Use: No  . Drug Use: No  . Sexual Activity: Not on file   Other Topics Concern  . Not on file   Social History Narrative  . No narrative on file    Objective:  Filed Vitals:   09/21/13 1549  BP: 138/70  Pulse: 59  Temp: 97.7 F (36.5 C)  Resp: 14     General appearance: alert, cooperative and appears stated age Ears: normal TM's and external ear canals both ears Throat: lips, mucosa, and tongue normal; teeth and gums normal Neck: no adenopathy, no carotid bruit, supple, symmetrical, trachea midline and thyroid not enlarged, symmetric, no tenderness/mass/nodules Back: symmetric, no curvature. ROM normal. No CVA tenderness. Lungs: clear to auscultation bilaterally Heart: regular rate and rhythm, S1, S2 normal, no murmur, click, rub or gallop Abdomen: soft, non-tender; bowel sounds normal; no masses,  no organomegaly Pulses: 2+ and symmetric Skin: Skin color, texture, turgor normal. No rashes or lesions Lymph nodes: Cervical, supraclavicular, and axillary nodes normal.  Assessment and Plan:  Chronic RUQ pain Etiology unclear.  She is s/p  cholecystectomy and is non tender on exam. UA is negative for blood and LFTS are normal.  Abdominal ultrasoundordered.  May need to consider CT of her chest abdomen and pelvis to rule out mass  Complicated grief Discussed trial of SSRI if symptoms persist.     Dehydration Suggested by UA and BMET.  Patient advised to suspend her furosemide and repeat BMET in one week.  Last EF was 50 to 55% so stopping the furosemide should not result in decompensation    Updated Medication List Outpatient Encounter Prescriptions as of 09/21/2013  Medication Sig Dispense Refill  . amiodarone (PACERONE) 200 MG tablet Take 1 tablet (200 mg total) by mouth 2 (two) times daily.  60 tablet  3  . amLODipine (NORVASC) 5 MG tablet Take 1 tablet (5 mg total) by mouth daily.  30 tablet  6  . Cyanocobalamin (B-12) 2500 MCG TABS Take by mouth daily.      [name] lisinopril (PRINIVIL,ZESTRIL) 20 MG tablet Take 1 tablet (20 mg total) by mouth at bedtime.  30 tablet  6  . omeprazole (PRILOSEC) 20 MG capsule Take 1 capsule (20 mg total) by mouth 2 (two) times daily.  90 capsule  3  . Red Yeast Rice Extract (RED YEAST RICE PO) Take 1,200 mg by mouth 2 (two) times daily.       . Rivaroxaban (XARELTO) 20 MG TABS Take 1 tablet (20 mg total) by mouth daily.  35 tablet  0  . traMADol (ULTRAM) 50 MG tablet Take 50 mg by mouth as needed. For pain       . zoster vaccine live, PF, (ZOSTAVAX) 65784 UNT/0.65ML injection Inject 19,400 Units into the skin once.  1 each  0  . furosemide (LASIX) 20 MG tablet Take 20 mg by mouth daily as needed.       No facility-administered encounter medications on file as of 09/21/2013.     Orders Placed This Encounter  Procedures  . US Abdomen Complete  . Comprehensive metabolic panel  . Basic metabolic panel  . POCT Urinalysis Dipstick    No Follow-up on file.

## 2013-09-23 ENCOUNTER — Encounter: Payer: Self-pay | Admitting: Internal Medicine

## 2013-09-23 DIAGNOSIS — E860 Dehydration: Secondary | ICD-10-CM | POA: Insufficient documentation

## 2013-09-23 NOTE — Assessment & Plan Note (Signed)
Discussed trial of SSRI if symptoms persist.

## 2013-09-23 NOTE — Assessment & Plan Note (Signed)
Suggested by UA and BMET.  Patient advised to suspend her furosemide and repeat BMET in one week.  Last EF was 50 to 55% so stopping the furosemide should not result in decompensation

## 2013-09-23 NOTE — Assessment & Plan Note (Signed)
Etiology unclear.  She is s/p cholecystectomy and is non tender on exam. UA is negative for blood and LFTS are normal.  Abdominal ultrasoundordered.  May need to consider CT of her chest abdomen and pelvis to rule out mass

## 2013-09-26 ENCOUNTER — Ambulatory Visit: Payer: Self-pay | Admitting: Internal Medicine

## 2013-09-27 ENCOUNTER — Telehealth: Payer: Self-pay | Admitting: Internal Medicine

## 2013-09-27 ENCOUNTER — Other Ambulatory Visit (INDEPENDENT_AMBULATORY_CARE_PROVIDER_SITE_OTHER): Payer: Medicare Other

## 2013-09-27 DIAGNOSIS — E860 Dehydration: Secondary | ICD-10-CM

## 2013-09-27 DIAGNOSIS — G8929 Other chronic pain: Secondary | ICD-10-CM

## 2013-09-27 LAB — BASIC METABOLIC PANEL
BUN: 17 mg/dL (ref 6–23)
CO2: 25 mEq/L (ref 19–32)
Calcium: 9 mg/dL (ref 8.4–10.5)
Chloride: 106 mEq/L (ref 96–112)
Creatinine, Ser: 1 mg/dL (ref 0.4–1.2)
GFR: 56.05 mL/min — ABNORMAL LOW (ref >60.00)
Glucose, Bld: 105 mg/dL — ABNORMAL HIGH (ref 70–99)
Potassium: 4.1 mEq/L (ref 3.5–5.1)
Sodium: 139 mEq/L (ref 135–145)

## 2013-09-27 NOTE — Assessment & Plan Note (Signed)
Her abdominal ultrasound was completely normal

## 2013-09-27 NOTE — Telephone Encounter (Signed)
Her abdominal ultrasound was completely normal and her kidney function has improved since stopping the furosemide. She needs to stay off of it.  If her Right upper quadrant is still bothering her, we will need to do additional imaging with CT or MRI

## 2013-09-28 NOTE — Telephone Encounter (Signed)
I agree,  If it is aggravated by movement and twisting it is likely musculoskeletal.  If she would like a PT evaluation ,  We can do that  .

## 2013-09-28 NOTE — Telephone Encounter (Signed)
Called and advised patient of results, verbalized understanding. 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

## 2013-10-02 NOTE — Telephone Encounter (Signed)
Left message, notifying of advice. Recommended to call back if she would like PT referral.

## 2013-10-02 NOTE — Telephone Encounter (Signed)
Spoke with pt, declines PT at this time, will call back if she changes her mind or pain worsens or persists.

## 2013-10-12 ENCOUNTER — Other Ambulatory Visit: Payer: Self-pay

## 2013-10-12 ENCOUNTER — Other Ambulatory Visit: Payer: Self-pay | Admitting: *Deleted

## 2013-10-12 DIAGNOSIS — R0602 Shortness of breath: Secondary | ICD-10-CM

## 2013-10-12 DIAGNOSIS — I4892 Unspecified atrial flutter: Secondary | ICD-10-CM

## 2013-10-12 MED ORDER — RIVAROXABAN 20 MG PO TABS: 20.0000 mg | ORAL_TABLET | Freq: Every day | ORAL | Status: DC

## 2013-10-12 NOTE — Telephone Encounter (Signed)
Requested Prescriptions   Signed Prescriptions Disp Refills  . Rivaroxaban (XARELTO) 20 MG TABS tablet 30 tablet 3    Sig: Take 1 tablet (20 mg total) by mouth daily.    Authorizing Provider: ARIDA, MUHAMMAD A    Ordering User: Lonisha Bobby C    

## 2013-10-12 NOTE — Telephone Encounter (Signed)
Pt states she only has 1 pill left

## 2013-10-16 ENCOUNTER — Encounter: Payer: Self-pay | Admitting: Internal Medicine

## 2013-11-05 ENCOUNTER — Telehealth: Payer: Self-pay | Admitting: Internal Medicine

## 2013-11-05 ENCOUNTER — Emergency Department: Payer: Self-pay | Admitting: Emergency Medicine

## 2013-11-05 LAB — BASIC METABOLIC PANEL
Anion Gap: 5 — ABNORMAL LOW (ref 7–16)
BUN: 12 mg/dL (ref 7–18)
Calcium, Total: 8.6 mg/dL (ref 8.5–10.1)
Chloride: 105 mmol/L (ref 98–107)
Co2: 26 mmol/L (ref 21–32)
Creatinine: 1.11 mg/dL (ref 0.60–1.30)
EGFR (African American): 56 — ABNORMAL LOW
EGFR (Non-African Amer.): 49 — ABNORMAL LOW
Glucose: 169 mg/dL — ABNORMAL HIGH (ref 65–99)
Osmolality: 276 (ref 275–301)
Potassium: 3.9 mmol/L (ref 3.5–5.1)
Sodium: 136 mmol/L (ref 136–145)

## 2013-11-05 LAB — PRO B NATRIURETIC PEPTIDE: B-Type Natriuretic Peptide: 1255 pg/mL — ABNORMAL HIGH (ref 0–450)

## 2013-11-05 LAB — PROTIME-INR
INR: 1.6
Prothrombin Time: 18.9 secs — ABNORMAL HIGH (ref 11.5–14.7)

## 2013-11-05 LAB — CBC
HCT: 38.6 % (ref 35.0–47.0)
HGB: 12.9 g/dL (ref 12.0–16.0)
MCH: 31.6 pg (ref 26.0–34.0)
MCHC: 33.4 g/dL (ref 32.0–36.0)
MCV: 95 fL (ref 80–100)
Platelet: 182 10*3/uL (ref 150–440)
RBC: 4.08 10*6/uL (ref 3.80–5.20)
RDW: 15 % — ABNORMAL HIGH (ref 11.5–14.5)
WBC: 6.3 10*3/uL (ref 3.6–11.0)

## 2013-11-05 LAB — TROPONIN I: Troponin-I: <0.02 ng/mL

## 2013-11-05 NOTE — Telephone Encounter (Signed)
Made appointment11/19 @ 10:15  Left message for pt to call office

## 2013-11-05 NOTE — Telephone Encounter (Signed)
Spouse called and confirmed appt

## 2013-11-05 NOTE — Telephone Encounter (Signed)
treaeted in ER this morning for PNA.   Need follow up with me on Wednesday.  15 mintue ok

## 2013-11-07 ENCOUNTER — Ambulatory Visit (INDEPENDENT_AMBULATORY_CARE_PROVIDER_SITE_OTHER): Payer: Medicare Other | Admitting: Internal Medicine

## 2013-11-07 ENCOUNTER — Encounter: Payer: Self-pay | Admitting: *Deleted

## 2013-11-07 ENCOUNTER — Encounter (INDEPENDENT_AMBULATORY_CARE_PROVIDER_SITE_OTHER): Payer: Self-pay

## 2013-11-07 ENCOUNTER — Encounter: Payer: Self-pay | Admitting: Internal Medicine

## 2013-11-07 VITALS — BP 122/58 | HR 59 | Temp 97.6°F | Resp 12 | Ht 64.0 in | Wt 167.8 lb

## 2013-11-07 DIAGNOSIS — Z23 Encounter for immunization: Secondary | ICD-10-CM

## 2013-11-07 DIAGNOSIS — J189 Pneumonia, unspecified organism: Secondary | ICD-10-CM

## 2013-11-07 MED ORDER — AMOXICILLIN-POT CLAVULANATE 875-125 MG PO TABS: 1.0000 | ORAL_TABLET | Freq: Two times a day (BID) | ORAL | Status: DC

## 2013-11-07 NOTE — Progress Notes (Signed)
Pre-visit discussion using our clinic review tool. No additional management support is needed unless otherwise documented below in the visit note.  

## 2013-11-07 NOTE — Progress Notes (Signed)
Patient ID: [name] [name], female   DOB: 1936-02-20, 77 y.o.   MRN: [id number]  Patient Active Problem List   Diagnosis Date Noted  . CAP (community acquired pneumonia) 11/09/2013  . Dehydration 09/23/2013  . Chronic RUQ pain 09/21/2013  . Tachycardia induced cardiomyopathy 07/20/2013  . Unspecified vitamin D deficiency 04/24/2013  . Family history of breast cancer in female 04/23/2013  . Routine general medical examination at a health care facility 04/23/2013  . Preop cardiovascular exam 04/09/2013  . Knee pain, bilateral 10/04/2012  . Inguinal pain of both sides 10/04/2012  . Obesity 06/30/2012  . Fatigue 06/22/2012  . History of [place] spotted fever 06/22/2012  . Persistent atrial fibrillation 05/09/2012  . Anxiety and depression 05/09/2012  . TACHYCARDIA 02/01/2011  . HYPERLIPIDEMIA-MIXED 07/08/2010  . HYPERTENSION 03/05/2009  . HEADACHE, CHRONIC 03/05/2009  . DYSPNEA 03/05/2009    Subjective:  CC:   Chief Complaint  Patient presents with  . Follow-up    HPI:   [name] [name] a 77 y.o. female who presents  For ER followup.  She was treated in ER on Monday  Nov 17th for  2 day history of worsening dyspnea and malaise accompanied by development of dull chest pain radiating to back.   CXR showed a RML pneumonia,  Started on abx and sent home with a Z Pak.  She reports no significant change n symptoms,   Still having pleuritic chest pain,  Throat is sore  And left ear feels full.   .  Has decreased BS on right side, and  left cervical LAD     Past Medical History  Diagnosis Date  . Atrial fibrillation     Paroxysmal, hx of  . Cardiomyopathy   . Dyspnea   . Headache(784.0)     chronic  . Hypertension   . Pre-syncope   . Chronic kidney disease     acute renal failure secondary to dehydration which is now resolved  . Light headedness     due to dehydration  . Atrial flutter 02/2011    s/p cardioversion   . Hyperlipidemia     Past Surgical History   Procedure Laterality Date  . Cardioversion      x 3  . Cholecystectomy    . Appendectomy    . Multiple orthopedic procedures    . Mastectomy  1986    Bilateral with silicone  breast implants, s/p saline replacements  . Cardiac catheterization    . Combined augmentation mammaplasty and abdominoplasty    . Abdominal hysterectomy  1990  . Total knee arthroplasty Left   . Cardioversion         The following portions of the patient's history were reviewed and updated as appropriate: Allergies, current medications, and problem list.    Review of Systems:   12 Pt  review of systems was negative except those addressed in the HPI,     History   Social History  . Marital Status: Married    Spouse Name: N/A    Number of Children: 1  . Years of Education: N/A   Occupational History  .     Social History Main Topics  . Smoking status: Never Smoker   . Smokeless tobacco: Never Used  . Alcohol Use: No  . Drug Use: No  . Sexual Activity: No   Other Topics Concern  . Not on file   Social History Narrative  . No narrative on file    Objective:  Filed  Vitals:   11/07/13 1024  BP: 122/58  Pulse: 59  Temp: 97.6 F (36.4 C)  Resp: 12     General appearance: alert, cooperative and appears stated age Ears: left ear with inflammed TM, external ear canals both ears Throat: lips, mucosa, and tongue normal; teeth and gums normal Neck: bilateral cervical denopathy, no carotid bruit, supple, symmetrical, trachea midline and thyroid not enlarged, symmetric,  Back: symmetric, no curvature. ROM normal. No CVA tenderness. Lungs: decreased BS right side at base and lateral/axilla Heart: regular rate and rhythm, S1, S2 normal, no murmur, click, rub or gallop Abdomen: soft, non-tender; bowel sounds normal; no masses,  no organomegaly Pulses: 2+ and symmetric Skin: Skin color, texture, turgor normal. No rashes or lesions Lymph nodes: Cervical, supraclavicular, and axillary  nodes normal.  Assessment and Plan:  CAP (community acquired pneumonia) Supported by exam and ER chest x ray, showing RML infiltrate.  Vital signs stable.  I have stopped  Z Pak and start augmentin.  ,  Will need follow up CXR in 6 weeks to ensure resolution of RML infiltrate   A total of 25 minutes of face to face time was spent with patient more than half of which was spent in counselling and coordination of care   Updated Medication List Outpatient Encounter Prescriptions as of 11/07/2013  Medication Sig  . amiodarone (PACERONE) 200 MG tablet Take 1 tablet (200 mg total) by mouth 2 (two) times daily.  [name] amLODipine (NORVASC) 5 MG tablet Take 1 tablet (5 mg total) by mouth daily.  . Cyanocobalamin (B-12) 2500 MCG TABS Take by mouth daily.  . furosemide (LASIX) 20 MG tablet Take 20 mg by mouth daily as needed.  [name] omeprazole (PRILOSEC) 20 MG capsule Take 1 capsule (20 mg total) by mouth 2 (two) times daily.  . Red Yeast Rice Extract (RED YEAST RICE PO) Take 1,200 mg by mouth 2 (two) times daily.   . Rivaroxaban (XARELTO) 20 MG TABS tablet Take 1 tablet (20 mg total) by mouth daily.  . traMADol (ULTRAM) 50 MG tablet Take 50 mg by mouth as needed. For pain   . [DISCONTINUED] azithromycin (ZITHROMAX) 250 MG tablet Take by mouth daily.  [name] amoxicillin-clavulanate (AUGMENTIN) 875-125 MG per tablet Take 1 tablet by mouth 2 (two) times daily. With food  . lisinopril (PRINIVIL,ZESTRIL) 20 MG tablet Take 1 tablet (20 mg total) by mouth at bedtime.  . [DISCONTINUED] zoster vaccine live, PF, (ZOSTAVAX) 16109 UNT/0.65ML injection Inject 19,400 Units into the skin once.     Orders Placed This Encounter  Procedures  . DG Chest 2 View  . Pneumococcal polysaccharide vaccine 23-valent greater than or equal to 2yo subcutaneous/IM    No Follow-up on file.

## 2013-11-07 NOTE — Patient Instructions (Signed)
I am changing your antibiotic to augmentin .  Please take it twice daily with food for the next 7 days  Please take a probiotic ( Align, Floraque or Culturelle)once daily for two weeks to prevent a serious antibiotic associated diarrhea  Called clostirudium dificile colitis  We will repeat your chest x ray in 6 weeks to make sure the right lobe infiltrate has resolved,  Sooner if you start feeling worse.

## 2013-11-09 ENCOUNTER — Encounter: Payer: Self-pay | Admitting: Internal Medicine

## 2013-11-09 DIAGNOSIS — J189 Pneumonia, unspecified organism: Secondary | ICD-10-CM | POA: Insufficient documentation

## 2013-11-09 NOTE — Assessment & Plan Note (Addendum)
Supported by exam and ER chest x ray, showing RML infiltrate.  Vital signs stable.  I have stopped  Z Pak and start augmentin.  ,  Will need follow up CXR in 6 weeks to ensure resolution of RML infiltrate

## 2013-11-12 NOTE — Telephone Encounter (Signed)
Patient called in this morning states she will be done with her medication tomorrow and wanted to know if she needed to come in for a follow up. She states she is feeling better but still has cough and wasn't sure if she needed to be on more medication. Please call and advise.

## 2013-11-13 ENCOUNTER — Ambulatory Visit (INDEPENDENT_AMBULATORY_CARE_PROVIDER_SITE_OTHER): Payer: Medicare Other | Admitting: Internal Medicine

## 2013-11-13 ENCOUNTER — Encounter: Payer: Self-pay | Admitting: Internal Medicine

## 2013-11-13 VITALS — BP 124/60 | HR 60 | Temp 97.5°F | Resp 12 | Ht 64.0 in | Wt 167.5 lb

## 2013-11-13 DIAGNOSIS — J189 Pneumonia, unspecified organism: Secondary | ICD-10-CM

## 2013-11-13 DIAGNOSIS — R519 Headache, unspecified: Secondary | ICD-10-CM

## 2013-11-13 DIAGNOSIS — R51 Headache: Secondary | ICD-10-CM

## 2013-11-13 DIAGNOSIS — R1011 Right upper quadrant pain: Secondary | ICD-10-CM

## 2013-11-13 DIAGNOSIS — R0609 Other forms of dyspnea: Secondary | ICD-10-CM

## 2013-11-13 DIAGNOSIS — G8929 Other chronic pain: Secondary | ICD-10-CM

## 2013-11-13 DIAGNOSIS — R0683 Snoring: Secondary | ICD-10-CM

## 2013-11-13 DIAGNOSIS — Z90710 Acquired absence of both cervix and uterus: Secondary | ICD-10-CM

## 2013-11-13 MED ORDER — BENZONATATE 200 MG PO CAPS: 200.0000 mg | ORAL_CAPSULE | Freq: Three times a day (TID) | ORAL | Status: DC | PRN

## 2013-11-13 MED ORDER — PREDNISONE (PAK) 10 MG PO TABS: ORAL_TABLET | ORAL | Status: DC

## 2013-11-13 NOTE — Progress Notes (Signed)
Patient ID: [name] [name], female   DOB: 1936-03-31, 77 y.o.   MRN: [id number]  Patient Active Problem List   Diagnosis Date Noted  . CAP (community acquired pneumonia) 11/09/2013  . Dehydration 09/23/2013  . Chronic RUQ pain 09/21/2013  . Tachycardia induced cardiomyopathy 07/20/2013  . Unspecified vitamin D deficiency 04/24/2013  . Family history of breast cancer in female 04/23/2013  . Routine general medical examination at a health care facility 04/23/2013  . Preop cardiovascular exam 04/09/2013  . Knee pain, bilateral 10/04/2012  . Inguinal pain of both sides 10/04/2012  . Obesity 06/30/2012  . Fatigue 06/22/2012  . History of [place] spotted fever 06/22/2012  . Persistent atrial fibrillation 05/09/2012  . Anxiety and depression 05/09/2012  . TACHYCARDIA 02/01/2011  . HYPERLIPIDEMIA-MIXED 07/08/2010  . HYPERTENSION 03/05/2009  . HEADACHE, CHRONIC 03/05/2009  . DYSPNEA 03/05/2009    Subjective:  CC:   Chief Complaint  Patient presents with  . Cough    Dry cough, once in a while she will cough up a discolored mucus.    HPI:   [name] [name] a 77 y.o. female who presents One week follow up on community acquired RML pneumonia diagnosed during ER visit last  Nov 17th for cough, malaise and chest pain .  She was prescribed a Z pak initially but failed to improve and I changed her to augmentin.  She is on Day 7,  Her sputum has resolved except for rare production of yellowish non blood streaked amounts. She notes continued malaise with resolution of pleuritic chest pain , left ear pain and denies  shortness of breath.  She has not had any loose stools and is still taking the probiotic. She feels worse in the morning. .  Husband says she snores and has had an abnormal breathing pattern.  She was diagnosed with "borderline" OSA several years ago and has never worn CPAP.     Past Medical History  Diagnosis Date  . Atrial fibrillation     Paroxysmal, hx of  .  Cardiomyopathy   . Dyspnea   . Headache(784.0)     chronic  . Hypertension   . Pre-syncope   . Chronic kidney disease     acute renal failure secondary to dehydration which is now resolved  . Light headedness     due to dehydration  . Atrial flutter 02/2011    s/p cardioversion   . Hyperlipidemia     Past Surgical History  Procedure Laterality Date  . Cardioversion      x 3  . Cholecystectomy    . Appendectomy    . Multiple orthopedic procedures    . Mastectomy  1986    Bilateral with silicone  breast implants, s/p saline replacements  . Cardiac catheterization    . Combined augmentation mammaplasty and abdominoplasty    . Abdominal hysterectomy  1990  . Total knee arthroplasty Left   . Cardioversion         The following portions of the patient's history were reviewed and updated as appropriate: Allergies, current medications, and problem list.    Review of Systems:   12 Pt  review of systems was negative except those addressed in the HPI,     History   Social History  . Marital Status: Married    Spouse Name: N/A    Number of Children: 1  . Years of Education: N/A   Occupational History  .     Social History Main Topics  .  Smoking status: Never Smoker   . Smokeless tobacco: Never Used  . Alcohol Use: No  . Drug Use: No  . Sexual Activity: No   Other Topics Concern  . Not on file   Social History Narrative  . No narrative on file    Objective:  Filed Vitals:   11/13/13 1016  BP: 124/60  Pulse: 60  Temp: 97.5 F (36.4 C)  Resp: 12     General appearance: alert, cooperative and appears stated age Ears: normal TM's and external ear canals both ears Throat: lips, mucosa, and tongue normal; teeth and gums normal Neck: no adenopathy, no carotid bruit, supple, symmetrical, trachea midline and thyroid not enlarged, symmetric, no tenderness/mass/nodules Back: symmetric, no curvature. ROM normal. No CVA tenderness. Lungs: clear to  auscultation bilaterally Heart: regular rate and rhythm, S1, S2 normal, no murmur, click, rub or gallop Abdomen: soft, non-tender; bowel sounds normal; no masses,  no organomegaly Pulses: 2+ and symmetric Skin: Skin color, texture, turgor normal. No rashes or lesions Lymph nodes: Cervical, supraclavicular, and axillary nodes normal.  Assessment and Plan: CAP (community acquired pneumonia) RML diagnosed Nov 17 during ER visit.  S/p A Z Pal followed by 7 day course of augmentin and 6 day prednisone taper .  No indication for continued abx.  Encouraged to resume daily walking.  Will need repeat CXR on or around jan 1 to ensure resolution of RML infiltrate.   Chronic RUQ pain Etiology unclear.  She is s/p cholecystectomy remotely.  Abd ultrasoudn done Oct 8 was normal. Given recent diagnosis of RML pneumonia, may need CT scan if chest x ray does not clear.   HEADACHE, CHRONIC Daily headache and morning malaise may be due to untreated OSA given history of snoring and observed abnormal breathing pattern by husband. .  Repeat Sleep study ordered.    Updated Medication List Outpatient Encounter Prescriptions as of 11/13/2013  Medication Sig  . amiodarone (PACERONE) 200 MG tablet Take 1 tablet (200 mg total) by mouth 2 (two) times daily.  [name] amLODipine (NORVASC) 5 MG tablet Take 1 tablet (5 mg total) by mouth daily.  . Cyanocobalamin (B-12) 2500 MCG TABS Take by mouth daily.  . furosemide (LASIX) 20 MG tablet Take 20 mg by mouth daily as needed.  [name] omeprazole (PRILOSEC) 20 MG capsule Take 1 capsule (20 mg total) by mouth 2 (two) times daily.  . Red Yeast Rice Extract (RED YEAST RICE PO) Take 1,200 mg by mouth 2 (two) times daily.   . Rivaroxaban (XARELTO) 20 MG TABS tablet Take 1 tablet (20 mg total) by mouth daily.  . traMADol (ULTRAM) 50 MG tablet Take 50 mg by mouth as needed. For pain   . amoxicillin-clavulanate (AUGMENTIN) 875-125 MG per tablet Take 1 tablet by mouth 2 (two) times daily. With  food  . benzonatate (TESSALON) 200 MG capsule Take 1 capsule (200 mg total) by mouth 3 (three) times daily as needed for cough.  . predniSONE (STERAPRED UNI-PAK) 10 MG tablet 6 tablets on Day 1 , then reduce by 1 tablet daily until gone  . [DISCONTINUED] lisinopril (PRINIVIL,ZESTRIL) 20 MG tablet Take 1 tablet (20 mg total) by mouth at bedtime.

## 2013-11-13 NOTE — Progress Notes (Signed)
Pre-visit discussion using our clinic review tool. No additional management support is needed unless otherwise documented below in the visit note.  

## 2013-11-13 NOTE — Patient Instructions (Signed)
Your pneumonia is improving.  You do not need another round of antibiotics.  But your body may take another week or two to get back to normal  Please go for a short walk (< 15 minutes) daily to aerate your lungs  I sent prednisone taper and tessalon cough capsules to your pharmacy  Keep flusing your sinuses,  Drink plenty of water,  And add guaifenesin 600 mg every 12 hours to help thin out the mucus  You need your sleep apnea test repeated.  We will call you with this.  Sleep Apnea  Sleep apnea is a sleep disorder characterized by abnormal pauses in breathing while you sleep. When your breathing pauses, the level of oxygen in your blood decreases. This causes you to move out of deep sleep and into light sleep. As a result, your quality of sleep is poor, and the system that carries your blood throughout your body (cardiovascular system) experiences stress. If sleep apnea remains untreated, the following conditions can develop:  High blood pressure (hypertension).  Coronary artery disease.  Inability to achieve or maintain an erection (impotence).  Impairment of your thought process (cognitive dysfunction). There are three types of sleep apnea: 1. Obstructive sleep apnea Pauses in breathing during sleep because of a blocked airway. 2. Central sleep apnea Pauses in breathing during sleep because the area of the brain that controls your breathing does not send the correct signals to the muscles that control breathing. 3. Mixed sleep apnea A combination of both obstructive and central sleep apnea. RISK FACTORS The following risk factors can increase your risk of developing sleep apnea:  Being overweight.  Smoking.  Having narrow passages in your nose and throat.  Being of older age.  Being female.  Alcohol use.  Sedative and tranquilizer use.  Ethnicity. Among individuals younger than 35 years, African Americans are at increased risk of sleep apnea. SYMPTOMS   Difficulty staying  asleep.  Daytime sleepiness and fatigue.  Loss of energy.  Irritability.  Loud, heavy snoring.  Morning headaches.  Trouble concentrating.  Forgetfulness.  Decreased interest in sex. DIAGNOSIS  In order to diagnose sleep apnea, your caregiver will perform a physical examination. Your caregiver may suggest that you take a home sleep test. Your caregiver may also recommend that you spend the night in a sleep lab. In the sleep lab, several monitors record information about your heart, lungs, and brain while you sleep. Your leg and arm movements and blood oxygen level are also recorded. TREATMENT The following actions may help to resolve mild sleep apnea:  Sleeping on your side.   Using a decongestant if you have nasal congestion.   Avoiding the use of depressants, including alcohol, sedatives, and narcotics.   Losing weight and modifying your diet if you are overweight. There also are devices and treatments to help open your airway:  Oral appliances. These are custom-made mouthpieces that shift your lower jaw forward and slightly open your bite. This opens your airway.  Devices that create positive airway pressure. This positive pressure "splints" your airway open to help you breathe better during sleep. The following devices create positive airway pressure:  Continuous positive airway pressure (CPAP) device. The CPAP device creates a continuous level of air pressure with an air pump. The air is delivered to your airway through a mask while you sleep. This continuous pressure keeps your airway open.  Nasal expiratory positive airway pressure (EPAP) device. The EPAP device creates positive air pressure as you exhale. The device  consists of single-use valves, which are inserted into each nostril and held in place by adhesive. The valves create very little resistance when you inhale but create much more resistance when you exhale. That increased resistance creates the positive  airway pressure. This positive pressure while you exhale keeps your airway open, making it easier to breath when you inhale again.  Bilevel positive airway pressure (BPAP) device. The BPAP device is used mainly in patients with central sleep apnea. This device is similar to the CPAP device because it also uses an air pump to deliver continuous air pressure through a mask. However, with the BPAP machine, the pressure is set at two different levels. The pressure when you exhale is lower than the pressure when you inhale.  Surgery. Typically, surgery is only done if you cannot comply with less invasive treatments or if the less invasive treatments do not improve your condition. Surgery involves removing excess tissue in your airway to create a wider passage way. Document Released: 11/26/2002 Document Revised: 04/02/2013 Document Reviewed: 04/13/2012 [facility] Patient Information 2014 [place], [place].

## 2013-11-13 NOTE — Telephone Encounter (Signed)
Patient seen in office today. 

## 2013-11-14 ENCOUNTER — Telehealth: Payer: Self-pay | Admitting: Internal Medicine

## 2013-11-14 NOTE — Telephone Encounter (Signed)
Pt calling.  Was here yesterday for appt.  Was recommended to have  A sleep apnea test.  Pt does not want to have sleep apnea testing at this time, and if it has been scheduled she would like it to be cancelled.  Is seeing Dr. Kirke Corin Monday.  Would like to try other method of testing that sleep apnea testing and would be willing to discuss sleep apnea testing at a later date.

## 2013-11-14 NOTE — Telephone Encounter (Signed)
FYI

## 2013-11-15 DIAGNOSIS — Z90710 Acquired absence of both cervix and uterus: Secondary | ICD-10-CM | POA: Insufficient documentation

## 2013-11-15 NOTE — Assessment & Plan Note (Signed)
Daily headache and morning malaise may be due to untreated OSA given history of snoring and observed abnormal breathing pattern by husband. .  Repeat Sleep study ordered.

## 2013-11-15 NOTE — Assessment & Plan Note (Addendum)
Etiology unclear.  She is s/p cholecystectomy remotely.  Abd ultrasoudn done Oct 8 was normal. Given recent diagnosis of RML pneumonia, may need CT scan if chest x ray does not clear.

## 2013-11-15 NOTE — Assessment & Plan Note (Addendum)
RML diagnosed Nov 17 during ER visit.  S/p A Z Pal followed by 7 day course of augmentin and 6 day prednisone taper .  No indication for continued abx.  Encouraged to resume daily walking.  Will need repeat CXR on or around jan 1 to ensure resolution of RML infiltrate.

## 2013-11-16 NOTE — Telephone Encounter (Signed)
[name] [name], sleep study referral cancelled.

## 2013-11-19 ENCOUNTER — Ambulatory Visit: Payer: Medicare Other | Admitting: Cardiovascular Disease

## 2013-11-19 ENCOUNTER — Ambulatory Visit (INDEPENDENT_AMBULATORY_CARE_PROVIDER_SITE_OTHER): Payer: Medicare Other | Admitting: Cardiovascular Disease

## 2013-11-19 ENCOUNTER — Encounter: Payer: Self-pay | Admitting: Cardiovascular Disease

## 2013-11-19 VITALS — BP 143/70 | HR 55 | Ht 64.5 in | Wt 165.0 lb

## 2013-11-19 DIAGNOSIS — I4891 Unspecified atrial fibrillation: Secondary | ICD-10-CM

## 2013-11-19 DIAGNOSIS — I10 Essential (primary) hypertension: Secondary | ICD-10-CM

## 2013-11-19 DIAGNOSIS — I4819 Other persistent atrial fibrillation: Secondary | ICD-10-CM

## 2013-11-19 MED ORDER — AMIODARONE HCL 200 MG PO TABS: 200.0000 mg | ORAL_TABLET | Freq: Every day | ORAL | Status: DC

## 2013-11-19 NOTE — Patient Instructions (Signed)
Decrease Amiodarone to 200 mg once daily.  Continue other medications.   Your physician wants you to follow-up in: 6 months.  You will receive a reminder letter in the mail two months in advance. If you don't receive a letter, please call our office to schedule the follow-up appointment.

## 2013-11-19 NOTE — Progress Notes (Signed)
HPI  [name] [name] is a 77 year old female who is here today for a followup visit.  She has known history of persistent atrial fibrillation/flutter status post multiple cardioversions in the past. Most recent echocardiogram in July of 2014 showed low normal LV systolic function with ejection fraction of 50-55%. 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 She had cardiac catheterization twice in the past without significant coronary artery disease.  She has been on Xarelto for anticoagulation.  Most recent cardioversion was in August of this year. She did have bradycardia post cardioversion. Thus, metoprolol was discontinued. For hypertension, amlodipine was resumed and later lisinopril was added. Lisinopril was discontinued after blood pressure went down significantly. She has been doing well from a cardiac standpoint with no recurrent palpitations, chest pain or dyspnea. She is suffering from an upper respiratory tract infection and currently on prednisone.  Allergies  Allergen Reactions  . Biaxin [Clarithromycin]   . Codeine   . Iodine   . Warfarin Sodium      Current Outpatient Prescriptions on File Prior to Visit  Medication Sig Dispense Refill  . amLODipine (NORVASC) 5 MG tablet Take 1 tablet (5 mg total) by mouth daily.  30 tablet  6  . benzonatate (TESSALON) 200 MG capsule Take 1 capsule (200 mg total) by mouth 3 (three) times daily as needed for cough.  20 capsule  0  . Cyanocobalamin (B-12) 2500 MCG TABS Take by mouth daily.      . furosemide (LASIX) 20 MG tablet Take 20 mg by mouth daily as needed.      [name] omeprazole (PRILOSEC) 20 MG capsule Take 1 capsule (20 mg total) by mouth 2 (two) times daily.  90 capsule  3  . Red Yeast Rice Extract (RED YEAST RICE PO) Take 1,200 mg by mouth 2 (two) times daily.       . Rivaroxaban (XARELTO) 20 MG TABS tablet Take 1 tablet (20 mg total) by mouth daily.  30 tablet  3  . traMADol (ULTRAM) 50 MG tablet Take  50 mg by mouth as needed. For pain        No current facility-administered medications on file prior to visit.     Past Medical History  Diagnosis Date  . Atrial fibrillation     Paroxysmal, hx of  . Cardiomyopathy   . Dyspnea   . Headache(784.0)     chronic  . Hypertension   . Pre-syncope   . Chronic kidney disease     acute renal failure secondary to dehydration which is now resolved  . Light headedness     due to dehydration  . Atrial flutter 02/2011    s/p cardioversion   . Hyperlipidemia      Past Surgical History  Procedure Laterality Date  . Cardioversion      x 3  . Cholecystectomy    . Appendectomy    . Multiple orthopedic procedures    . Mastectomy  1986    Bilateral with silicone  breast implants, s/p saline replacements  . Cardiac catheterization    . Combined augmentation mammaplasty and abdominoplasty    . Abdominal hysterectomy  1990  . Total knee arthroplasty Left   . Cardioversion       Family History  Problem Relation Age of Onset  . Heart disease Mother   . Cancer Father     stomach  . Cancer Sister     breast  . Cancer Maternal Grandmother  breast  . Cancer Sister     breast  . Diabetes Neg Hx   . Heart disease Son     found at autopsy     History   Social History  . Marital Status: Married    Spouse Name: N/A    Number of Children: 1  . Years of Education: N/A   Occupational History  .     Social History Main Topics  . Smoking status: Never Smoker   . Smokeless tobacco: Never Used  . Alcohol Use: No  . Drug Use: No  . Sexual Activity: No   Other Topics Concern  . Not on file   Social History Narrative  . No narrative on file     PHYSICAL EXAM   BP 143/70  Pulse 55  Ht 5' 4.5" (1.638 m)  Wt 165 lb (74.844 kg)  BMI 27.90 kg/m2 Constitutional: She is oriented to person, place, and time. She appears well-developed and well-nourished. No distress.  HENT: No nasal discharge.  Head: Normocephalic and  atraumatic.  Eyes: Pupils are equal and round. Right eye exhibits no discharge. Left eye exhibits no discharge.  Neck: Normal range of motion. Neck supple. No JVD present. No thyromegaly present.  Cardiovascular: Normal rate, irregular rhythm, normal heart sounds. Exam reveals no gallop and no friction rub. No murmur heard.  Pulmonary/Chest: Effort normal and breath sounds normal. No stridor. No respiratory distress. She has no wheezes. She has no rales. She exhibits no tenderness.  Abdominal: Soft. Bowel sounds are normal. She exhibits no distension. There is no tenderness. There is no rebound and no guarding.  Musculoskeletal: Normal range of motion. She exhibits trace edema and no tenderness.  Neurological: She is alert and oriented to person, place, and time. Coordination normal.  Skin: Skin is warm and dry. No rash noted. She is not diaphoretic. No erythema. No pallor.  Psychiatric: She has a normal mood and affect. Her behavior is normal. Judgment and thought content normal.     EKG: Normal sinus rhythm -Right bundle branch block.   -  Nonspecific T-abnormality.   ABNORMAL   ASSESSMENT AND PLAN

## 2013-11-19 NOTE — Assessment & Plan Note (Signed)
She is maintaining in sinus rhythm on amiodarone. I recommend decreasing the dose to 200 mg once daily. She had liver function enzymes checked in October which were normal. I will plan on repeat liver profile and TSH in 6 months from now. She is tolerating long-term anticoagulation with Xarelto.

## 2013-11-19 NOTE — Assessment & Plan Note (Signed)
Blood pressure is reasonably controlled on amlodipine. 

## 2013-11-21 ENCOUNTER — Ambulatory Visit (INDEPENDENT_AMBULATORY_CARE_PROVIDER_SITE_OTHER): Payer: Medicare Other | Admitting: Adult Health

## 2013-11-21 ENCOUNTER — Encounter: Payer: Self-pay | Admitting: Adult Health

## 2013-11-21 VITALS — BP 126/70 | HR 60 | Temp 98.2°F | Wt 167.0 lb

## 2013-11-21 DIAGNOSIS — J3489 Other specified disorders of nose and nasal sinuses: Secondary | ICD-10-CM

## 2013-11-21 DIAGNOSIS — R0981 Nasal congestion: Secondary | ICD-10-CM | POA: Insufficient documentation

## 2013-11-21 MED ORDER — FLUTICASONE PROPIONATE 50 MCG/ACT NA SUSP: 2.0000 | Freq: Every day | NASAL | Status: DC

## 2013-11-21 NOTE — Assessment & Plan Note (Signed)
Try flonase nasal spray 2 sprays into each nostril. Saline nasal spray to moisten and irrigate sinuses. Call if symptoms worsen, you develop a fever or secretions become green colored. OTC Coricidin products contain no stimulants and are safe for people with HTN or heart history.

## 2013-11-21 NOTE — Patient Instructions (Addendum)
Coricidin Over the counter products do not interfere with your heart medication. They do not contain any stimulant medications.  Start flonase nasal spray 2 sprays into each nostril daily until your symptoms are improved.  Drink plenty of fluids to maintain hydration and to help liquify your secretions.

## 2013-11-21 NOTE — Progress Notes (Signed)
Pre visit review using our clinic review tool, if applicable. No additional management support is needed unless otherwise documented below in the visit note. 

## 2013-11-21 NOTE — Progress Notes (Signed)
   Subjective:    Patient ID: [name] [name], female    DOB: [birth year], 77 y.o.   MRN: [id number]  HPI  Patient is a pleasant 77 year old female who presents to clinic with sinus congestion. She reports completing antibiotic approximately one week ago and treated for pneumonia. These symptoms have improved. She is afebrile, denies shortness of breath or wheezing.  Current Outpatient Prescriptions on File Prior to Visit  Medication Sig Dispense Refill  . amiodarone (PACERONE) 200 MG tablet Take 1 tablet (200 mg total) by mouth daily.  30 tablet  6  . amLODipine (NORVASC) 5 MG tablet Take 1 tablet (5 mg total) by mouth daily.  30 tablet  6  . benzonatate (TESSALON) 200 MG capsule Take 1 capsule (200 mg total) by mouth 3 (three) times daily as needed for cough.  20 capsule  0  . Cyanocobalamin (B-12) 2500 MCG TABS Take by mouth daily.      . furosemide (LASIX) 20 MG tablet Take 20 mg by mouth daily as needed.      [name] omeprazole (PRILOSEC) 20 MG capsule Take 1 capsule (20 mg total) by mouth 2 (two) times daily.  90 capsule  3  . Red Yeast Rice Extract (RED YEAST RICE PO) Take 1,200 mg by mouth 2 (two) times daily.       . Rivaroxaban (XARELTO) 20 MG TABS tablet Take 1 tablet (20 mg total) by mouth daily.  30 tablet  3  . traMADol (ULTRAM) 50 MG tablet Take 50 mg by mouth as needed. For pain        No current facility-administered medications on file prior to visit.      Review of Systems Positive for sinus congestion, clear drainage, mild cough Negative for fever, chills, wheezing or shortness of breath    Objective:   Physical Exam  Constitutional: She is oriented to person, place, and time. She appears well-developed and well-nourished. No distress.  Cardiovascular: Normal rate and regular rhythm.   Pulmonary/Chest: Effort normal and breath sounds normal. No respiratory distress. She has no wheezes. She has no rales.  Lymphadenopathy:    She has no cervical adenopathy.    Neurological: She is alert and oriented to person, place, and time.  Psychiatric: She has a normal mood and affect. Her behavior is normal. Judgment and thought content normal.          Assessment & Plan:

## 2013-12-07 ENCOUNTER — Telehealth: Payer: Self-pay | Admitting: Internal Medicine

## 2013-12-07 NOTE — Telephone Encounter (Signed)
Pt states she had pneumonia about 6 wk ago and was to have another chest x-ray.  Pt asking if this can be scheduled now.

## 2013-12-07 NOTE — Telephone Encounter (Signed)
I cannot find the cxr where she was diagnosed with pneumonia, but if she states was told she needed a f/u cxr then ok for her to get this done.  I don't know where her last one was.   I am not sure where she wants to have it done.  It appears that Dr Darrick Huntsman has ordered one on her.

## 2013-12-07 NOTE — Telephone Encounter (Signed)
Pt needs to come by office to pick up signed order and take it with her to where she had the last one performed. The xray is not due until Jan per TT's order

## 2013-12-10 NOTE — Telephone Encounter (Signed)
Pt called regarding cxr.  States Thursday, 12/18 would be the 6 week mark to have cxr done.

## 2013-12-14 NOTE — Telephone Encounter (Signed)
Pt came in to pick up order to have cxr done.  Advised pt order is not due until early January per Dr. Florina Ou.  No order given at this time.

## 2013-12-25 ENCOUNTER — Telehealth: Payer: Self-pay | Admitting: *Deleted

## 2013-12-25 NOTE — Telephone Encounter (Signed)
Notified patient samples of xarelto 20 mg available to pick up.  

## 2013-12-25 NOTE — Telephone Encounter (Signed)
Samples Xarelto 20mg . Call when ready.

## 2013-12-31 ENCOUNTER — Telehealth: Payer: Self-pay | Admitting: Internal Medicine

## 2013-12-31 NOTE — Telephone Encounter (Signed)
Pt states she was to have a 6 wk f/u chest x-ray per Dr. Derrel Nip.  Pt is having some congestion and has made an appt with R. Rey 1/13.  Would like her lungs checked also to see if she still needs the chest x-ray.

## 2014-01-01 ENCOUNTER — Ambulatory Visit (INDEPENDENT_AMBULATORY_CARE_PROVIDER_SITE_OTHER): Payer: Medicare Other | Admitting: Adult Health

## 2014-01-01 ENCOUNTER — Encounter: Payer: Self-pay | Admitting: Adult Health

## 2014-01-01 VITALS — BP 128/68 | HR 56 | Temp 97.6°F | Resp 14 | Wt 171.8 lb

## 2014-01-01 DIAGNOSIS — J329 Chronic sinusitis, unspecified: Secondary | ICD-10-CM | POA: Insufficient documentation

## 2014-01-01 MED ORDER — AMOXICILLIN-POT CLAVULANATE 875-125 MG PO TABS: 1.0000 | ORAL_TABLET | Freq: Two times a day (BID) | ORAL | Status: DC

## 2014-01-01 NOTE — Patient Instructions (Signed)
  Start Augmentin twice a day for 10 days.  Drink fluids to maintain hydration.  If no improvement within 4-5 days please let us know.

## 2014-01-01 NOTE — Assessment & Plan Note (Signed)
Symptoms > 2 weeks. Maxillary sinus pain. Start Augmentin x 10 days. RTC if no improvement within 4-5 days.

## 2014-01-01 NOTE — Telephone Encounter (Signed)
Please advise would you like patient to have CXR before appointment?

## 2014-01-01 NOTE — Progress Notes (Signed)
Pre-visit discussion using our clinic review tool. No additional management support is needed unless otherwise documented below in the visit note.  

## 2014-01-01 NOTE — Progress Notes (Signed)
   Subjective:    Patient ID: [name] [name], female    DOB: 1936/11/14, 78 y.o.   MRN: [id number]  HPI  Pt is a 78 y/o female who presents with sinus pressure, drainage, maxillary sinus pain, hoarseness that has been going on for several weeks. She was helping an ailing sister in law and did not have much time for herself. She recently had pneumonia in November. She was taking OTC products to help symptoms.   Current Outpatient Prescriptions on File Prior to Visit  Medication Sig Dispense Refill  . amiodarone (PACERONE) 200 MG tablet Take 1 tablet (200 mg total) by mouth daily.  30 tablet  6  . amLODipine (NORVASC) 5 MG tablet Take 1 tablet (5 mg total) by mouth daily.  30 tablet  6  . benzonatate (TESSALON) 200 MG capsule Take 1 capsule (200 mg total) by mouth 3 (three) times daily as needed for cough.  20 capsule  0  . Cyanocobalamin (B-12) 2500 MCG TABS Take by mouth daily.      . fluticasone (FLONASE) 50 MCG/ACT nasal spray Place 2 sprays into both nostrils daily.  16 g  6  . omeprazole (PRILOSEC) 20 MG capsule Take 1 capsule (20 mg total) by mouth 2 (two) times daily.  90 capsule  3  . Red Yeast Rice Extract (RED YEAST RICE PO) Take 1,200 mg by mouth 2 (two) times daily.       . Rivaroxaban (XARELTO) 20 MG TABS tablet Take 1 tablet (20 mg total) by mouth daily.  30 tablet  3  . traMADol (ULTRAM) 50 MG tablet Take 50 mg by mouth as needed. For pain       . furosemide (LASIX) 20 MG tablet Take 20 mg by mouth daily as needed.       No current facility-administered medications on file prior to visit.     Review of Systems  Constitutional: Positive for fatigue. Negative for fever and chills.  HENT: Positive for congestion, postnasal drip, rhinorrhea and sinus pressure.   Respiratory: Negative.   Cardiovascular: Negative.   Genitourinary: Negative.   Neurological: Negative.   Psychiatric/Behavioral: Negative.        Objective:   Physical Exam  Constitutional: She is oriented to  person, place, and time. She appears well-developed and well-nourished. No distress.  HENT:  Head: Normocephalic and atraumatic.  Mouth/Throat: No oropharyngeal exudate.  Pharyngeal erythema  Cardiovascular: Normal rate, regular rhythm and normal heart sounds.   Pulmonary/Chest: Effort normal and breath sounds normal. No respiratory distress. She has no wheezes. She has no rales.  Lymphadenopathy:    She has no cervical adenopathy.  Neurological: She is alert and oriented to person, place, and time.  Skin: Skin is warm and dry.  Psychiatric: She has a normal mood and affect. Her behavior is normal. Judgment and thought content normal.          Assessment & Plan:

## 2014-01-02 NOTE — Telephone Encounter (Signed)
Lungs were completely clear with good air movement. Symptoms were all sinus related. She wanted to pass on getting the follow up xray since her symptoms were resolved.

## 2014-01-14 ENCOUNTER — Other Ambulatory Visit: Payer: Self-pay | Admitting: *Deleted

## 2014-01-14 MED ORDER — AMIODARONE HCL 200 MG PO TABS: 200.0000 mg | ORAL_TABLET | Freq: Every day | ORAL | Status: DC

## 2014-01-14 NOTE — Telephone Encounter (Signed)
Requested Prescriptions   Signed Prescriptions Disp Refills  . amiodarone (PACERONE) 200 MG tablet 30 tablet 3    Sig: Take 1 tablet (200 mg total) by mouth daily.    Authorizing Provider: ARIDA, MUHAMMAD A    Ordering User: Jahiem Franzoni C    

## 2014-01-15 ENCOUNTER — Other Ambulatory Visit: Payer: Self-pay

## 2014-01-15 MED ORDER — AMIODARONE HCL 200 MG PO TABS: 200.0000 mg | ORAL_TABLET | Freq: Every day | ORAL | Status: DC

## 2014-01-15 NOTE — Telephone Encounter (Signed)
Requested Prescriptions   Signed Prescriptions Disp Refills  . amiodarone (PACERONE) 200 MG tablet 30 tablet 3    Sig: Take 1 tablet (200 mg total) by mouth daily.    Authorizing Provider: [name] A    Ordering User: Britt Bottom

## 2014-01-17 ENCOUNTER — Telehealth: Payer: Self-pay | Admitting: *Deleted

## 2014-01-17 NOTE — Telephone Encounter (Signed)
Patient is light headed, blurry eyes and has indigestion and no energy. Please advise

## 2014-01-17 NOTE — Telephone Encounter (Signed)
Her GERD might be getting worse.

## 2014-01-17 NOTE — Telephone Encounter (Signed)
Patient has been light headed and blurry vision when she wakes in the middle of the night she stated that Dr. Derrel Nip wanted her to have a sleep study for sleep apnea. She has not had that study yet. She has indigestion and some pain below her breat on the left side. She is burping a lot and thinks it might be related to her stomach. Her pressure has been "good" 143/71. HR  64 . O2 95%. I instructed to call Dr. Derrel Nip for sleep study and avoid foods that give her indigestion. Also, to call EMS if she felt that her chest pain worsened or became an emergency situation.

## 2014-01-18 NOTE — Telephone Encounter (Signed)
Patient stated she is feeling much better today and is going to see her pcp

## 2014-01-31 ENCOUNTER — Ambulatory Visit: Payer: Medicare Other | Admitting: Cardiovascular Disease

## 2014-02-07 ENCOUNTER — Ambulatory Visit: Payer: Medicare Other | Admitting: Cardiovascular Disease

## 2014-02-11 ENCOUNTER — Telehealth: Payer: Self-pay | Admitting: *Deleted

## 2014-02-11 NOTE — Telephone Encounter (Signed)
Patient called for [facility] 20mg  samples. Please call her when ready

## 2014-02-11 NOTE — Telephone Encounter (Signed)
Patient is aware that we currently don't have any samples of Xarelto 20 mg available at the moment. We will be contacting drug reps to bring [place] some and we will give her a call once samples are available.

## 2014-02-13 NOTE — Telephone Encounter (Signed)
Samples placed up front for pick up.  

## 2014-03-05 ENCOUNTER — Telehealth: Payer: Self-pay

## 2014-03-05 NOTE — Telephone Encounter (Signed)
It is likely related to treatment with Amlodipine. Not serious.

## 2014-03-05 NOTE — Telephone Encounter (Signed)
Informed patient that per Dr. Fletcher Anon "It is likely related to treatment with Amlodipine. Not serious". She verbalized understanding. Encouraged her to call if swelling became worse.

## 2014-03-05 NOTE — Telephone Encounter (Signed)
Pt was in office with her husband, and states she has had significant swelling over the last 2 days in her legs and feet, and is concerned. Please call.

## 2014-03-05 NOTE — Telephone Encounter (Signed)
Patient stated her ankles and feet have been swelling the past 2 days. It gets a little better if she elevates them. She is concerned because this has not happened before in the past . She denies feeling like there is fluid building any where else.

## 2014-03-08 ENCOUNTER — Other Ambulatory Visit: Payer: Self-pay | Admitting: *Deleted

## 2014-03-08 DIAGNOSIS — I10 Essential (primary) hypertension: Secondary | ICD-10-CM

## 2014-03-08 MED ORDER — AMLODIPINE BESYLATE 5 MG PO TABS: 5.0000 mg | ORAL_TABLET | Freq: Every day | ORAL | Status: DC

## 2014-03-08 NOTE — Telephone Encounter (Signed)
Requested Prescriptions   Signed Prescriptions Disp Refills  . amLODipine (NORVASC) 5 MG tablet 30 tablet 6    Sig: Take 1 tablet (5 mg total) by mouth daily.    Authorizing Provider: [name] A    Ordering User: Britt Bottom

## 2014-03-11 LAB — HM PAP SMEAR: HM Pap smear: NORMAL

## 2014-03-18 ENCOUNTER — Other Ambulatory Visit: Payer: Self-pay | Admitting: Internal Medicine

## 2014-03-18 ENCOUNTER — Telehealth: Payer: Self-pay | Admitting: *Deleted

## 2014-03-18 NOTE — Telephone Encounter (Signed)
Faxed risk assessment form for colonoscopy to [facility].

## 2014-03-26 ENCOUNTER — Other Ambulatory Visit: Payer: Self-pay | Admitting: Internal Medicine

## 2014-04-04 ENCOUNTER — Telehealth: Payer: Self-pay | Admitting: *Deleted

## 2014-04-04 ENCOUNTER — Ambulatory Visit: Payer: Medicare Other | Admitting: Cardiovascular Disease

## 2014-04-04 NOTE — Telephone Encounter (Signed)
Needs samples of Xarelto 20mg . She is coming in tomorrow at 7:45 am can you please have her samples ready.

## 2014-04-04 NOTE — Telephone Encounter (Signed)
Placed samples at front desk for pick.

## 2014-04-05 ENCOUNTER — Encounter (INDEPENDENT_AMBULATORY_CARE_PROVIDER_SITE_OTHER): Payer: Self-pay

## 2014-04-05 ENCOUNTER — Ambulatory Visit (INDEPENDENT_AMBULATORY_CARE_PROVIDER_SITE_OTHER): Payer: Medicare Other | Admitting: Cardiovascular Disease

## 2014-04-05 ENCOUNTER — Encounter: Payer: Self-pay | Admitting: Cardiovascular Disease

## 2014-04-05 VITALS — BP 134/62 | HR 54 | Ht 64.5 in | Wt 169.5 lb

## 2014-04-05 DIAGNOSIS — R000 Tachycardia, unspecified: Secondary | ICD-10-CM

## 2014-04-05 DIAGNOSIS — I10 Essential (primary) hypertension: Secondary | ICD-10-CM

## 2014-04-05 DIAGNOSIS — I4891 Unspecified atrial fibrillation: Secondary | ICD-10-CM

## 2014-04-05 DIAGNOSIS — I4819 Other persistent atrial fibrillation: Secondary | ICD-10-CM

## 2014-04-05 DIAGNOSIS — I43 Cardiomyopathy in diseases classified elsewhere: Secondary | ICD-10-CM

## 2014-04-05 MED ORDER — LISINOPRIL 20 MG PO TABS: 20.0000 mg | ORAL_TABLET | Freq: Every day | ORAL | Status: DC

## 2014-04-05 NOTE — Patient Instructions (Signed)
Your physician has recommended you make the following change in your medication:  Stop Amlodipine  Start Lisinopril 20 mg daily   Your physician wants you to follow-up in: 4 months. You will receive a reminder letter in the mail two months in advance. If you don't receive a letter, please call our office to schedule the follow-up appointment.

## 2014-04-05 NOTE — Assessment & Plan Note (Signed)
She is maintaining in sinus rhythm on amiodarone. She is tolerating long-term anticoagulation with Xarelto.  Repeat liver and thyroid profile in 6 months.  Hold Xarelto 2 days before colonoscopy.

## 2014-04-05 NOTE — Assessment & Plan Note (Signed)
Most recent EF was normal.

## 2014-04-05 NOTE — Assessment & Plan Note (Signed)
BP is controlled but she is having increase leg edema. Thus, I stopped Amlodipine and started Lisinopril 20 mg once daily which she was on before.

## 2014-04-05 NOTE — Progress Notes (Signed)
HPI  [name] [name] is a 78 year old female who is here today for a followup visit.  She has known history of persistent atrial fibrillation/flutter status post multiple cardioversions in the past. Most recent echocardiogram in July of 2014 showed low normal LV systolic function with ejection fraction of 50-55%. 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 She had cardiac catheterization twice in the past without significant coronary artery disease.  She has been on Xarelto for anticoagulation.  Most recent cardioversion was in August of 2014. She did have bradycardia post cardioversion. Thus, metoprolol was discontinued. For hypertension, amlodipine was resumed and later lisinopril was added. Lisinopril was discontinued after blood pressure went down significantly. She has been doing well from a cardiac standpoint with no recurrent palpitations, chest pain or dyspnea.  However, she is having worsening leg edema. She was found to have occult blood in stool and is having colonoscopy done soon.   Allergies  Allergen Reactions  . Biaxin [Clarithromycin]   . Codeine   . Iodine   . Warfarin Sodium      Current Outpatient Prescriptions on File Prior to Visit  Medication Sig Dispense Refill  . amiodarone (PACERONE) 200 MG tablet Take 1 tablet (200 mg total) by mouth daily.  30 tablet  3  . amLODipine (NORVASC) 5 MG tablet Take 1 tablet (5 mg total) by mouth daily.  30 tablet  6  . Cyanocobalamin (B-12) 2500 MCG TABS Take by mouth daily.      . fluticasone (FLONASE) 50 MCG/ACT nasal spray Place 2 sprays into both nostrils daily.  16 g  6  . furosemide (LASIX) 20 MG tablet TAKE ONE (1) TABLET EACH DAY  30 tablet  5  . omeprazole (PRILOSEC) 20 MG capsule TAKE ONE CAPSULE BY MOUTH TWICE A DAY  90 capsule  2  . Red Yeast Rice Extract (RED YEAST RICE PO) Take 1,200 mg by mouth 2 (two) times daily.       . Rivaroxaban (XARELTO) 20 MG TABS tablet Take 1 tablet (20 mg  total) by mouth daily.  30 tablet  3  . traMADol (ULTRAM) 50 MG tablet Take 50 mg by mouth as needed. For pain        No current facility-administered medications on file prior to visit.     Past Medical History  Diagnosis Date  . Atrial fibrillation     Paroxysmal, hx of  . Cardiomyopathy   . Dyspnea   . Headache(784.0)     chronic  . Hypertension   . Pre-syncope   . Chronic kidney disease     acute renal failure secondary to dehydration which is now resolved  . Light headedness     due to dehydration  . Atrial flutter 02/2011    s/p cardioversion   . Hyperlipidemia      Past Surgical History  Procedure Laterality Date  . Cardioversion      x 3  . Cholecystectomy    . Appendectomy    . Multiple orthopedic procedures    . Mastectomy  1986    Bilateral with silicone  breast implants, s/p saline replacements  . Cardiac catheterization    . Combined augmentation mammaplasty and abdominoplasty    . Abdominal hysterectomy  1990  . Total knee arthroplasty Left   . Cardioversion       Family History  Problem Relation Age of Onset  . Heart disease Mother   . Cancer Father  stomach  . Cancer Sister     breast  . Cancer Maternal Grandmother     breast  . Cancer Sister     breast  . Diabetes Neg Hx   . Heart disease Son     found at autopsy     History   Social History  . Marital Status: Married    Spouse Name: N/A    Number of Children: 1  . Years of Education: N/A   Occupational History  .     Social History Main Topics  . Smoking status: Never Smoker   . Smokeless tobacco: Never Used  . Alcohol Use: No  . Drug Use: No  . Sexual Activity: No   Other Topics Concern  . Not on file   Social History Narrative  . No narrative on file     PHYSICAL EXAM   BP 134/62  Pulse 64  Ht 5' 4.5" (1.638 m)  Wt 169 lb 8 oz (76.885 kg)  BMI 28.66 kg/m2 Constitutional: She is oriented to person, place, and time. She appears well-developed and  well-nourished. No distress.  HENT: No nasal discharge.  Head: Normocephalic and atraumatic.  Eyes: Pupils are equal and round. Right eye exhibits no discharge. Left eye exhibits no discharge.  Neck: Normal range of motion. Neck supple. No JVD present. No thyromegaly present.  Cardiovascular: Normal rate, irregular rhythm, normal heart sounds. Exam reveals no gallop and no friction rub. No murmur heard.  Pulmonary/Chest: Effort normal and breath sounds normal. No stridor. No respiratory distress. She has no wheezes. She has no rales. She exhibits no tenderness.  Abdominal: Soft. Bowel sounds are normal. She exhibits no distension. There is no tenderness. There is no rebound and no guarding.  Musculoskeletal: Normal range of motion. She exhibits trace edema and no tenderness.  Neurological: She is alert and oriented to person, place, and time. Coordination normal.  Skin: Skin is warm and dry. No rash noted. She is not diaphoretic. No erythema. No pallor.  Psychiatric: She has a normal mood and affect. Her behavior is normal. Judgment and thought content normal.     EKG: sinus bradycardia -Right bundle branch block.   -  Nonspecific T-abnormality.   ABNORMAL   ASSESSMENT AND PLAN

## 2014-04-11 ENCOUNTER — Ambulatory Visit: Payer: Self-pay | Admitting: Gastroenterology

## 2014-04-11 LAB — HM COLONOSCOPY: HM Colonoscopy: NORMAL

## 2014-04-20 ENCOUNTER — Telehealth: Payer: Self-pay | Admitting: Internal Medicine

## 2014-04-20 DIAGNOSIS — K449 Diaphragmatic hernia without obstruction or gangrene: Secondary | ICD-10-CM

## 2014-05-01 ENCOUNTER — Ambulatory Visit: Payer: Self-pay | Admitting: Obstetrics and Gynecology

## 2014-05-03 ENCOUNTER — Telehealth: Payer: Self-pay

## 2014-05-03 NOTE — Telephone Encounter (Signed)
Placed samples of Xarelto at front desk for pick up.

## 2014-05-03 NOTE — Telephone Encounter (Signed)
Pt would like Xarelto samples.  

## 2014-05-11 LAB — HM MAMMOGRAPHY: HM Mammogram: NORMAL

## 2014-05-15 ENCOUNTER — Other Ambulatory Visit: Payer: Self-pay

## 2014-05-15 MED ORDER — AMIODARONE HCL 200 MG PO TABS: 200.0000 mg | ORAL_TABLET | Freq: Every day | ORAL | Status: DC

## 2014-05-15 NOTE — Telephone Encounter (Signed)
Refill sent for amiodarone 

## 2014-05-22 ENCOUNTER — Encounter: Payer: Self-pay | Admitting: Internal Medicine

## 2014-06-07 ENCOUNTER — Encounter: Payer: Self-pay | Admitting: Cardiovascular Disease

## 2014-06-07 ENCOUNTER — Ambulatory Visit: Payer: Medicare Other | Admitting: Cardiovascular Disease

## 2014-06-07 ENCOUNTER — Ambulatory Visit (INDEPENDENT_AMBULATORY_CARE_PROVIDER_SITE_OTHER): Payer: Medicare Other | Admitting: Cardiovascular Disease

## 2014-06-07 ENCOUNTER — Emergency Department: Payer: Self-pay | Admitting: Emergency Medicine

## 2014-06-07 ENCOUNTER — Telehealth: Payer: Self-pay

## 2014-06-07 VITALS — BP 168/78 | HR 55 | Ht 64.0 in | Wt 167.0 lb

## 2014-06-07 DIAGNOSIS — R000 Tachycardia, unspecified: Secondary | ICD-10-CM

## 2014-06-07 DIAGNOSIS — I10 Essential (primary) hypertension: Secondary | ICD-10-CM

## 2014-06-07 DIAGNOSIS — I43 Cardiomyopathy in diseases classified elsewhere: Secondary | ICD-10-CM

## 2014-06-07 DIAGNOSIS — I4891 Unspecified atrial fibrillation: Secondary | ICD-10-CM

## 2014-06-07 DIAGNOSIS — I4819 Other persistent atrial fibrillation: Secondary | ICD-10-CM

## 2014-06-07 LAB — URINALYSIS, COMPLETE
Bacteria: NONE SEEN
Bilirubin,UR: NEGATIVE
Blood: NEGATIVE
Glucose,UR: NEGATIVE mg/dL (ref 0–75)
Ketone: NEGATIVE
Leukocyte Esterase: NEGATIVE
Nitrite: NEGATIVE
Ph: 8 (ref 4.5–8.0)
Protein: NEGATIVE
RBC,UR: <1 /HPF (ref 0–5)
Specific Gravity: 1.012 (ref 1.003–1.030)
Squamous Epithelial: NONE SEEN
WBC UR: NONE SEEN /HPF (ref 0–5)

## 2014-06-07 LAB — CBC
HCT: 42.5 % (ref 35.0–47.0)
HGB: 13.9 g/dL (ref 12.0–16.0)
MCH: 31.2 pg (ref 26.0–34.0)
MCHC: 32.8 g/dL (ref 32.0–36.0)
MCV: 95 fL (ref 80–100)
Platelet: 248 10*3/uL (ref 150–440)
RBC: 4.46 10*6/uL (ref 3.80–5.20)
RDW: 14.6 % — ABNORMAL HIGH (ref 11.5–14.5)
WBC: 7.8 10*3/uL (ref 3.6–11.0)

## 2014-06-07 LAB — COMPREHENSIVE METABOLIC PANEL
Albumin: 3.9 g/dL (ref 3.4–5.0)
Alkaline Phosphatase: 81 U/L
Anion Gap: 4 — ABNORMAL LOW (ref 7–16)
BUN: 12 mg/dL (ref 7–18)
Bilirubin,Total: 0.7 mg/dL (ref 0.2–1.0)
Calcium, Total: 9.4 mg/dL (ref 8.5–10.1)
Chloride: 102 mmol/L (ref 98–107)
Co2: 28 mmol/L (ref 21–32)
Creatinine: 0.89 mg/dL (ref 0.60–1.30)
EGFR (African American): >60
EGFR (Non-African Amer.): >60
Glucose: 119 mg/dL — ABNORMAL HIGH (ref 65–99)
Osmolality: 269 (ref 275–301)
Potassium: 4.5 mmol/L (ref 3.5–5.1)
SGOT(AST): 20 U/L (ref 15–37)
SGPT (ALT): 27 U/L (ref 12–78)
Sodium: 134 mmol/L — ABNORMAL LOW (ref 136–145)
Total Protein: 7.8 g/dL (ref 6.4–8.2)

## 2014-06-07 LAB — TROPONIN I: Troponin-I: <0.02 ng/mL

## 2014-06-07 NOTE — Telephone Encounter (Signed)
Patients husband stated that patients blood pressure was 200/94 for morning She took nitro at 0600 and pressure came down to 165/83 It is now trending back up  She is having nausea, "bad headache", and dizzy   Denies chest pain  She took her AM medication but vomited some time after and is not sure if she retained her medication  Patients husband is requesting that she see Dr. Fletcher Anon ASAP  Patient placed on schedule to see Dr. Fletcher Anon today

## 2014-06-07 NOTE — Assessment & Plan Note (Signed)
The patient is having severely elevated blood pressure with severe headache associated with nausea and vomiting. Given that she is on long-term anticoagulation, I am concerned about possible intracranial bleed. Thus, I am sending her to the emergency room in order to get CT scan of the head. She has not been able to keep her medications down and might need some IV medication to lower the blood pressure. I also recommend checking routine labs including CBC, CMP and cardiac enzymes.

## 2014-06-07 NOTE — Assessment & Plan Note (Signed)
Most recent ejection fraction was normal.

## 2014-06-07 NOTE — Assessment & Plan Note (Signed)
She is maintaining in sinus rhythm with amiodarone. 

## 2014-06-07 NOTE — Patient Instructions (Addendum)
Go the ED at [facility].   You will need a Head CT and Labs   I spoke with Arbutus Ped RN  She is aware of your situation

## 2014-06-07 NOTE — Telephone Encounter (Signed)
Pt husband called states pt BP is 200/94, 196/90, states her head hurts and has taken a nitro at 6:00 brought it down to 165/83.

## 2014-06-07 NOTE — Progress Notes (Signed)
HPI  [name] [name] is a 78 year old female who is here today for a followup visit.  She has known history of persistent atrial fibrillation/flutter status post multiple cardioversions in the past. Most recent echocardiogram in July of 2014 showed low normal LV systolic function with ejection fraction of 50-55%. 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 She had cardiac catheterization twice in the past without significant coronary artery disease.  She has been on Xarelto for anticoagulation.  Most recent cardioversion was in August of 2014. She did have bradycardia post cardioversion. She called our office to be seen urgently. She started feeling nauseous yesterday. She woke up this morning with severe headache associated with nausea, vomiting and dizziness. Blood pressure was 200/94 which is very unusual for her. She has not been able to keep anything down. She denies any chest pain. She does complain of fatigue and shortness of breath.   Allergies  Allergen Reactions  . Biaxin [Clarithromycin]   . Codeine   . Iodine   . Warfarin Sodium      Current Outpatient Prescriptions on File Prior to Visit  Medication Sig Dispense Refill  . amiodarone (PACERONE) 200 MG tablet Take 1 tablet (200 mg total) by mouth daily.  30 tablet  3  . Cyanocobalamin (B-12) 2500 MCG TABS Take by mouth daily.      . fluticasone (FLONASE) 50 MCG/ACT nasal spray Place 2 sprays into both nostrils daily.  16 g  6  . furosemide (LASIX) 20 MG tablet TAKE ONE (1) TABLET EACH DAY  30 tablet  5  . omeprazole (PRILOSEC) 20 MG capsule TAKE ONE CAPSULE BY MOUTH TWICE A DAY  90 capsule  2  . Red Yeast Rice Extract (RED YEAST RICE PO) Take 1,200 mg by mouth 2 (two) times daily.       . Rivaroxaban (XARELTO) 20 MG TABS tablet Take 1 tablet (20 mg total) by mouth daily.  30 tablet  3  . traMADol (ULTRAM) 50 MG tablet Take 50 mg by mouth as needed. For pain        No current  facility-administered medications on file prior to visit.     Past Medical History  Diagnosis Date  . Atrial fibrillation     Paroxysmal, hx of  . Cardiomyopathy   . Dyspnea   . Headache(784.0)     chronic  . Hypertension   . Pre-syncope   . Chronic kidney disease     acute renal failure secondary to dehydration which is now resolved  . Light headedness     due to dehydration  . Atrial flutter 02/2011    s/p cardioversion   . Hyperlipidemia      Past Surgical History  Procedure Laterality Date  . Cardioversion      x 3  . Cholecystectomy    . Appendectomy    . Multiple orthopedic procedures    . Mastectomy  1986    Bilateral with silicone  breast implants, s/p saline replacements  . Cardiac catheterization    . Combined augmentation mammaplasty and abdominoplasty    . Abdominal hysterectomy  1990  . Total knee arthroplasty Left   . Cardioversion       Family History  Problem Relation Age of Onset  . Heart disease Mother   . Cancer Father     stomach  . Cancer Sister     breast  . Cancer Maternal Grandmother     breast  . Cancer  Sister     breast  . Diabetes Neg Hx   . Heart disease Son     found at autopsy     History   Social History  . Marital Status: Married    Spouse Name: N/A    Number of Children: 1  . Years of Education: N/A   Occupational History  .     Social History Main Topics  . Smoking status: Never Smoker   . Smokeless tobacco: Never Used  . Alcohol Use: No  . Drug Use: No  . Sexual Activity: No   Other Topics Concern  . Not on file   Social History Narrative  . No narrative on file     PHYSICAL EXAM   BP 168/78  Pulse 55  Ht 5\' 4"  (1.626 m)  Wt 167 lb (75.751 kg)  BMI 28.65 kg/m2 Constitutional: She is oriented to person, place, and time. She appears well-developed and well-nourished. No distress.  HENT: No nasal discharge.  Head: Normocephalic and atraumatic.  Eyes: Pupils are equal and round. Right eye  exhibits no discharge. Left eye exhibits no discharge.  Neck: Normal range of motion. Neck supple. No JVD present. No thyromegaly present.  Cardiovascular: Normal rate, irregular rhythm, normal heart sounds. Exam reveals no gallop and no friction rub. No murmur heard.  Pulmonary/Chest: Effort normal and breath sounds normal. No stridor. No respiratory distress. She has no wheezes. She has no rales. She exhibits no tenderness.  Abdominal: Soft. Bowel sounds are normal. She exhibits no distension. There is no tenderness. There is no rebound and no guarding.  Musculoskeletal: Normal range of motion. She exhibits trace edema and no tenderness.  Neurological: She is alert and oriented to person, place, and time. Coordination normal.  Skin: Skin is warm and dry. No rash noted. She is not diaphoretic. No erythema. No pallor.  Psychiatric: She has a normal mood and affect. Her behavior is normal. Judgment and thought content normal.     EKG: Sinus  Bradycardia  -Right bundle branch block.   -  Negative T-waves  -Possible  Anterolateral  ischemia.   ABNORMAL    ASSESSMENT AND PLAN

## 2014-06-20 ENCOUNTER — Telehealth: Payer: Self-pay

## 2014-06-20 NOTE — Telephone Encounter (Signed)
Pt would like Xarelto 20 mg samples. Please call.

## 2014-06-20 NOTE — Telephone Encounter (Signed)
Notified patient samples available of xarelto 20 mg

## 2014-06-25 NOTE — Telephone Encounter (Signed)
This encounter was created in error - please disregard.

## 2014-07-18 ENCOUNTER — Telehealth: Payer: Self-pay

## 2014-07-18 NOTE — Telephone Encounter (Signed)
Samples @ front desk for pick up xarelto 20 mg.

## 2014-07-18 NOTE — Telephone Encounter (Signed)
Pt would like Xarelto20mg  samples

## 2014-07-30 ENCOUNTER — Telehealth: Payer: Self-pay

## 2014-07-30 NOTE — Telephone Encounter (Signed)
Error  Patient not calling for herself  She is calling about her husband

## 2014-07-30 NOTE — Telephone Encounter (Signed)
Pt would like to talk with you regarding Metoprolol. Please call.

## 2014-08-02 ENCOUNTER — Telehealth: Payer: Self-pay | Admitting: *Deleted

## 2014-08-02 NOTE — Telephone Encounter (Signed)
Spoke w/ pt.  Advised her that I have left samples of Xarelto 20mg  at the front desk for her to pick up at her convenience.

## 2014-08-02 NOTE — Telephone Encounter (Signed)
Wants samples of Xarelto. Please call when ready.

## 2014-08-12 ENCOUNTER — Emergency Department: Payer: Self-pay | Admitting: Emergency Medicine

## 2014-08-12 ENCOUNTER — Telehealth: Payer: Self-pay | Admitting: *Deleted

## 2014-08-12 LAB — URINALYSIS, COMPLETE
Bacteria: NONE SEEN
Bilirubin,UR: NEGATIVE
Blood: NEGATIVE
Glucose,UR: NEGATIVE mg/dL (ref 0–75)
Ketone: NEGATIVE
Leukocyte Esterase: NEGATIVE
Nitrite: NEGATIVE
Ph: 7 (ref 4.5–8.0)
Protein: NEGATIVE
RBC,UR: <1 /HPF (ref 0–5)
Specific Gravity: 1.003 (ref 1.003–1.030)
Squamous Epithelial: <1
WBC UR: 1 /HPF (ref 0–5)

## 2014-08-12 LAB — CBC WITH DIFFERENTIAL/PLATELET
Basophil #: 0.1 10*3/uL (ref 0.0–0.1)
Basophil %: 0.8 %
Eosinophil #: 0 10*3/uL (ref 0.0–0.7)
Eosinophil %: 0.4 %
HCT: 40.4 % (ref 35.0–47.0)
HGB: 12.8 g/dL (ref 12.0–16.0)
Lymphocyte #: 0.8 10*3/uL — ABNORMAL LOW (ref 1.0–3.6)
Lymphocyte %: 9.8 %
MCH: 30.4 pg (ref 26.0–34.0)
MCHC: 31.8 g/dL — ABNORMAL LOW (ref 32.0–36.0)
MCV: 96 fL (ref 80–100)
Monocyte #: 0.5 x10 3/mm (ref 0.2–0.9)
Monocyte %: 5.3 %
Neutrophil #: 7.1 10*3/uL — ABNORMAL HIGH (ref 1.4–6.5)
Neutrophil %: 83.7 %
Platelet: 204 10*3/uL (ref 150–440)
RBC: 4.22 10*6/uL (ref 3.80–5.20)
RDW: 14.1 % (ref 11.5–14.5)
WBC: 8.5 10*3/uL (ref 3.6–11.0)

## 2014-08-12 LAB — HEPATIC FUNCTION PANEL A (ARMC)
Albumin: 3.7 g/dL (ref 3.4–5.0)
Alkaline Phosphatase: 63 U/L
Bilirubin, Direct: 0.1 mg/dL (ref 0.00–0.20)
Bilirubin,Total: 0.6 mg/dL (ref 0.2–1.0)
SGOT(AST): 30 U/L (ref 15–37)
SGPT (ALT): 23 U/L
Total Protein: 7.2 g/dL (ref 6.4–8.2)

## 2014-08-12 LAB — BASIC METABOLIC PANEL
Anion Gap: 9 (ref 7–16)
BUN: 16 mg/dL (ref 7–18)
Calcium, Total: 8.8 mg/dL (ref 8.5–10.1)
Chloride: 103 mmol/L (ref 98–107)
Co2: 25 mmol/L (ref 21–32)
Creatinine: 1.08 mg/dL (ref 0.60–1.30)
EGFR (African American): 58 — ABNORMAL LOW
EGFR (Non-African Amer.): 50 — ABNORMAL LOW
Glucose: 100 mg/dL — ABNORMAL HIGH (ref 65–99)
Osmolality: 275 (ref 275–301)
Potassium: 4.5 mmol/L (ref 3.5–5.1)
Sodium: 137 mmol/L (ref 136–145)

## 2014-08-12 LAB — LIPASE, BLOOD: Lipase: 116 U/L (ref 73–393)

## 2014-08-12 LAB — TROPONIN I: Troponin-I: 0.02 ng/mL

## 2014-08-12 NOTE — Telephone Encounter (Signed)
Please called [name] [name] he is worried about his wife's bp. Its running very high.

## 2014-08-12 NOTE — Telephone Encounter (Signed)
Returned call to patient  Patients husband has taken her to the ed  Will follow up with patient tomorrow to discuss Dr. Jacklynn Ganong recommendation

## 2014-08-12 NOTE — Telephone Encounter (Signed)
Take an extra dose of Lisinopril now.  Increase the dose to 40 mg once daily.

## 2014-08-12 NOTE — Telephone Encounter (Signed)
Patients husband called and stated her blood pressure has been elevated in the morning  Pressure has been up for 2-3 days  On average it has been around 200/100  She took her morning medications and pressure is still 177/93 She has a headache and is vomiting  Her husband is worried because there is a red tinge in her vomit  He wants someone to see her today

## 2014-08-15 ENCOUNTER — Ambulatory Visit: Payer: Medicare Other | Admitting: Cardiovascular Disease

## 2014-08-15 ENCOUNTER — Other Ambulatory Visit: Payer: Self-pay

## 2014-08-15 MED ORDER — AMIODARONE HCL 200 MG PO TABS: 200.0000 mg | ORAL_TABLET | Freq: Every day | ORAL | Status: DC

## 2014-08-15 NOTE — Telephone Encounter (Signed)
Refill sent for amiodarone 

## 2014-08-19 ENCOUNTER — Ambulatory Visit (INDEPENDENT_AMBULATORY_CARE_PROVIDER_SITE_OTHER): Payer: Medicare Other | Admitting: Cardiovascular Disease

## 2014-08-19 ENCOUNTER — Encounter: Payer: Self-pay | Admitting: Cardiovascular Disease

## 2014-08-19 VITALS — BP 110/50 | HR 58 | Ht 64.0 in | Wt 166.5 lb

## 2014-08-19 DIAGNOSIS — I10 Essential (primary) hypertension: Secondary | ICD-10-CM

## 2014-08-19 DIAGNOSIS — I4891 Unspecified atrial fibrillation: Secondary | ICD-10-CM

## 2014-08-19 DIAGNOSIS — I4819 Other persistent atrial fibrillation: Secondary | ICD-10-CM

## 2014-08-19 MED ORDER — LISINOPRIL 20 MG PO TABS: 20.0000 mg | ORAL_TABLET | Freq: Every day | ORAL | Status: DC

## 2014-08-19 MED ORDER — CLONIDINE HCL 0.1 MG PO TABS: 0.1000 mg | ORAL_TABLET | Freq: Two times a day (BID) | ORAL | Status: DC | PRN

## 2014-08-19 NOTE — Progress Notes (Signed)
HPI  [name] [name] is a 78 year old female who is here today for a followup visit.  She has known history of persistent atrial fibrillation/flutter status post multiple cardioversions in the past. Most recent echocardiogram in July of 2014 showed low normal LV systolic function with ejection fraction of 50-55%. 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 She had cardiac catheterization twice in the past without significant coronary artery disease.  She has been on Xarelto for anticoagulation.  Most recent cardioversion was in August of 2014. She did have bradycardia post cardioversion. She had 2 recent emergency room visits in the last few months for severely elevated blood pressure with nausea and headache. CT scan of the head showed no evidence of bleeding. The dose of lisinopril was increased to 20 mg twice daily. Since then, she reports that her blood pressure has been running low and she has been feeling very tired and dizzy.   Allergies  Allergen Reactions  . Biaxin [Clarithromycin]   . Codeine   . Iodine   . Warfarin Sodium      Current Outpatient Prescriptions on File Prior to Visit  Medication Sig Dispense Refill  . amiodarone (PACERONE) 200 MG tablet Take 1 tablet (200 mg total) by mouth daily.  30 tablet  6  . Cyanocobalamin (B-12) 2500 MCG TABS Take by mouth daily.      . fluticasone (FLONASE) 50 MCG/ACT nasal spray Place 2 sprays into both nostrils daily.  16 g  6  . furosemide (LASIX) 20 MG tablet TAKE ONE (1) TABLET EACH DAY  30 tablet  5  . lisinopril (PRINIVIL,ZESTRIL) 10 MG tablet Take 20 mg by mouth daily.       [name] omeprazole (PRILOSEC) 20 MG capsule TAKE ONE CAPSULE BY MOUTH TWICE A DAY  90 capsule  2  . Red Yeast Rice Extract (RED YEAST RICE PO) Take 1,200 mg by mouth 2 (two) times daily.       . Rivaroxaban (XARELTO) 20 MG TABS tablet Take 1 tablet (20 mg total) by mouth daily.  30 tablet  3  . traMADol (ULTRAM) 50 MG tablet Take  50 mg by mouth as needed. For pain        No current facility-administered medications on file prior to visit.     Past Medical History  Diagnosis Date  . Atrial fibrillation     Paroxysmal, hx of  . Cardiomyopathy   . Dyspnea   . Headache(784.0)     chronic  . Hypertension   . Pre-syncope   . Chronic kidney disease     acute renal failure secondary to dehydration which is now resolved  . Light headedness     due to dehydration  . Atrial flutter 02/2011    s/p cardioversion   . Hyperlipidemia      Past Surgical History  Procedure Laterality Date  . Cardioversion      x 3  . Cholecystectomy    . Appendectomy    . Multiple orthopedic procedures    . Mastectomy  1986    Bilateral with silicone  breast implants, s/p saline replacements  . Cardiac catheterization    . Combined augmentation mammaplasty and abdominoplasty    . Abdominal hysterectomy  1990  . Total knee arthroplasty Left   . Cardioversion       Family History  Problem Relation Age of Onset  . Heart disease Mother   . Cancer Father     stomach  .  Cancer Sister     breast  . Cancer Maternal Grandmother     breast  . Cancer Sister     breast  . Diabetes Neg Hx   . Heart disease Son     found at autopsy     History   Social History  . Marital Status: Married    Spouse Name: N/A    Number of Children: 1  . Years of Education: N/A   Occupational History  .     Social History Main Topics  . Smoking status: Never Smoker   . Smokeless tobacco: Never Used  . Alcohol Use: No  . Drug Use: No  . Sexual Activity: No   Other Topics Concern  . Not on file   Social History Narrative  . No narrative on file     PHYSICAL EXAM   BP 110/50  Ht 5\' 4"  (1.626 m)  Wt 166 lb 8 oz (75.524 kg)  BMI 28.57 kg/m2 Constitutional: She is oriented to person, place, and time. She appears well-developed and well-nourished. No distress.  HENT: No nasal discharge.  Head: Normocephalic and atraumatic.    Eyes: Pupils are equal and round. Right eye exhibits no discharge. Left eye exhibits no discharge.  Neck: Normal range of motion. Neck supple. No JVD present. No thyromegaly present.  Cardiovascular: Normal rate, irregular rhythm, normal heart sounds. Exam reveals no gallop and no friction rub. No murmur heard.  Pulmonary/Chest: Effort normal and breath sounds normal. No stridor. No respiratory distress. She has no wheezes. She has no rales. She exhibits no tenderness.  Abdominal: Soft. Bowel sounds are normal. She exhibits no distension. There is no tenderness. There is no rebound and no guarding.  Musculoskeletal: Normal range of motion. She exhibits trace edema and no tenderness.  Neurological: She is alert and oriented to person, place, and time. Coordination normal.  Skin: Skin is warm and dry. No rash noted. She is not diaphoretic. No erythema. No pallor.  Psychiatric: She has a normal mood and affect. Her behavior is normal. Judgment and thought content normal.     EKG: Sinus  Bradycardia  -Right bundle branch block.   ABNORMAL    ASSESSMENT AND PLAN

## 2014-08-19 NOTE — Patient Instructions (Signed)
Your physician has recommended you make the following change in your medication:  Increase Lisinopril to 20 mg once daily Start Clonidine 0.1 mg twice daily as needed for SBP>180  Your physician recommends that you schedule a follow-up appointment in:  3 months with Dr. Fletcher Anon

## 2014-08-19 NOTE — Assessment & Plan Note (Signed)
She is maintaining in sinus rhythm with amiodarone. Continue long-term anticoagulation. I elected to keep the current dose of amiodarone given previous atrial arrhythmia while on amiodarone.

## 2014-08-19 NOTE — Assessment & Plan Note (Signed)
She has been having labile hypertension. Blood pressure improved after increasing the dose of lisinopril. However, blood pressure is now on the low side and she does complain of dizziness and fatigue. Thus, I decreased the dose back to 20 mg once daily and decided to add clonidine 0.1 mg to be used as needed if systolic blood pressure exceeds 180.

## 2014-09-02 ENCOUNTER — Other Ambulatory Visit: Payer: Self-pay | Admitting: *Deleted

## 2014-09-02 DIAGNOSIS — R0602 Shortness of breath: Secondary | ICD-10-CM

## 2014-09-02 MED ORDER — RIVAROXABAN 20 MG PO TABS: 20.0000 mg | ORAL_TABLET | Freq: Every day | ORAL | Status: DC

## 2014-09-10 ENCOUNTER — Telehealth: Payer: Self-pay

## 2014-09-10 NOTE — Telephone Encounter (Signed)
Patient stated that her blood pressure has been elevated regularly lately  She has had to take her clonidine twice daily for the past week  She stated that it makes her feel dizzy  She "feels like her heart rate is dropping" She wanted to know if there is anything else she can take for her elevated blood pressure

## 2014-09-10 NOTE — Telephone Encounter (Signed)
We can try Amlodipine 2.5 mg once daily.

## 2014-09-10 NOTE — Telephone Encounter (Signed)
Pt states Clonidine makes her dizzy and caused her HR to drop, doesn't necessarily bring her BP down.

## 2014-09-11 MED ORDER — AMLODIPINE BESYLATE 2.5 MG PO TABS: 2.5000 mg | ORAL_TABLET | Freq: Every day | ORAL | Status: DC

## 2014-09-11 NOTE — Telephone Encounter (Signed)
LVM 9/23 

## 2014-09-11 NOTE — Telephone Encounter (Signed)
Informed patient of Dr. Aridas recommendation Patient verbalized understanding   

## 2014-09-13 ENCOUNTER — Telehealth: Payer: Self-pay

## 2014-09-13 LAB — CBC AND DIFFERENTIAL
HCT: 40 % (ref 36–46)
Hemoglobin: 12.6 g/dL (ref 12.0–16.0)
Platelets: 195 10*3/uL (ref 150–399)
WBC: 5.8 10^3/mL

## 2014-09-13 LAB — BASIC METABOLIC PANEL
Anion Gap: 4 — ABNORMAL LOW (ref 7–16)
BUN: 18 mg/dL (ref 4–21)
BUN: 18 mg/dL (ref 7–18)
Calcium, Total: 8.5 mg/dL (ref 8.5–10.1)
Chloride: 103 mmol/L (ref 98–107)
Co2: 26 mmol/L (ref 21–32)
Creatinine: 1.1 mg/dL (ref 0.5–1.1)
Creatinine: 1.14 mg/dL (ref 0.60–1.30)
EGFR (African American): 60 — ABNORMAL LOW
EGFR (Non-African Amer.): 49 — ABNORMAL LOW
Glucose: 102 mg/dL
Glucose: 102 mg/dL — ABNORMAL HIGH (ref 65–99)
Osmolality: 268 (ref 275–301)
Potassium: 4.1 mmol/L (ref 3.5–5.1)
Sodium: 133 mmol/L — AB (ref 137–147)
Sodium: 133 mmol/L — ABNORMAL LOW (ref 136–145)

## 2014-09-13 LAB — CBC WITH DIFFERENTIAL/PLATELET
Basophil #: 0.1 10*3/uL (ref 0.0–0.1)
Basophil %: 0.8 %
Eosinophil #: 0 10*3/uL (ref 0.0–0.7)
Eosinophil %: 0.5 %
HCT: 41 % (ref 35.0–47.0)
HGB: 13.1 g/dL (ref 12.0–16.0)
Lymphocyte #: 1 10*3/uL (ref 1.0–3.6)
Lymphocyte %: 10.5 %
MCH: 30.7 pg (ref 26.0–34.0)
MCHC: 32 g/dL (ref 32.0–36.0)
MCV: 96 fL (ref 80–100)
Monocyte #: 0.5 x10 3/mm (ref 0.2–0.9)
Monocyte %: 5.1 %
Neutrophil #: 7.8 10*3/uL — ABNORMAL HIGH (ref 1.4–6.5)
Neutrophil %: 83.1 %
Platelet: 203 10*3/uL (ref 150–440)
RBC: 4.28 10*6/uL (ref 3.80–5.20)
RDW: 14.8 % — ABNORMAL HIGH (ref 11.5–14.5)
WBC: 9.4 10*3/uL (ref 3.6–11.0)

## 2014-09-13 LAB — TROPONIN I: Troponin-I: <0.02 ng/mL

## 2014-09-13 NOTE — Telephone Encounter (Signed)
Pt states she has done exactly what Dr. Fletcher Anon told her to do with her medications, and it has not helped. Please call to discuss this. Pt did make an appt for this Monday 9/28.

## 2014-09-13 NOTE — Telephone Encounter (Signed)
Discussed patient issue with Dr. Fletcher Anon Dr. Fletcher Anon instructed patient to continue the amlodipine through the weekend and keep her 9/28 appt  Patient verbalized understanding

## 2014-09-14 ENCOUNTER — Observation Stay: Payer: Self-pay | Admitting: Specialist

## 2014-09-14 LAB — CBC WITH DIFFERENTIAL/PLATELET
Basophil #: 0 10*3/uL (ref 0.0–0.1)
Basophil #: 0 10*3/uL (ref 0.0–0.1)
Basophil %: 0.4 %
Basophil %: 0.1 %
Eosinophil #: 0 10*3/uL (ref 0.0–0.7)
Eosinophil #: 0 10*3/uL (ref 0.0–0.7)
Eosinophil %: 0 %
Eosinophil %: 0 %
HCT: 39.7 % (ref 35.0–47.0)
HCT: 40.2 % (ref 35.0–47.0)
HGB: 13 g/dL (ref 12.0–16.0)
HGB: 12.6 g/dL (ref 12.0–16.0)
Lymphocyte #: 0.3 10*3/uL — ABNORMAL LOW (ref 1.0–3.6)
Lymphocyte #: 0.3 10*3/uL — ABNORMAL LOW (ref 1.0–3.6)
Lymphocyte %: 4.5 %
Lymphocyte %: 5.1 %
MCH: 31.5 pg (ref 26.0–34.0)
MCH: 30.2 pg (ref 26.0–34.0)
MCHC: 32.7 g/dL (ref 32.0–36.0)
MCHC: 31.3 g/dL — ABNORMAL LOW (ref 32.0–36.0)
MCV: 96 fL (ref 80–100)
MCV: 97 fL (ref 80–100)
Monocyte #: 0 x10 3/mm — ABNORMAL LOW (ref 0.2–0.9)
Monocyte #: 0 x10 3/mm — ABNORMAL LOW (ref 0.2–0.9)
Monocyte %: 0.6 %
Monocyte %: 0.4 %
Neutrophil #: 6.6 10*3/uL — ABNORMAL HIGH (ref 1.4–6.5)
Neutrophil #: 5.5 10*3/uL (ref 1.4–6.5)
Neutrophil %: 94.5 %
Neutrophil %: 94.4 %
Platelet: 195 10*3/uL (ref 150–440)
Platelet: 195 10*3/uL (ref 150–440)
RBC: 4.13 10*6/uL (ref 3.80–5.20)
RBC: 4.16 10*6/uL (ref 3.80–5.20)
RDW: 14.7 % — ABNORMAL HIGH (ref 11.5–14.5)
RDW: 14.5 % (ref 11.5–14.5)
WBC: 7 10*3/uL (ref 3.6–11.0)
WBC: 5.8 10*3/uL (ref 3.6–11.0)

## 2014-09-14 LAB — URINALYSIS, COMPLETE
Bacteria: NONE SEEN
Bilirubin,UR: NEGATIVE
Blood: NEGATIVE
Glucose,UR: NEGATIVE mg/dL (ref 0–75)
Ketone: NEGATIVE
Nitrite: NEGATIVE
Ph: 5 (ref 4.5–8.0)
Protein: NEGATIVE
RBC,UR: <1 /HPF (ref 0–5)
Specific Gravity: 1.006 (ref 1.003–1.030)
Squamous Epithelial: NONE SEEN
WBC UR: 1 /HPF (ref 0–5)

## 2014-09-16 ENCOUNTER — Ambulatory Visit: Payer: Medicare Other | Admitting: Cardiovascular Disease

## 2014-09-16 DIAGNOSIS — I059 Rheumatic mitral valve disease, unspecified: Secondary | ICD-10-CM

## 2014-09-18 ENCOUNTER — Telehealth: Payer: Self-pay | Admitting: Internal Medicine

## 2014-09-18 ENCOUNTER — Telehealth: Payer: Self-pay | Admitting: *Deleted

## 2014-09-18 NOTE — Telephone Encounter (Signed)
When was patient discharged?  7 days would need to be seen sooner

## 2014-09-18 NOTE — Telephone Encounter (Signed)
09/25/14 there is a 8.00 am and 1015 am slot blocked same day can I use. Patient was admitted with headache and elevated BP with dizziness please advise.

## 2014-09-18 NOTE — Telephone Encounter (Signed)
Discharged: 09/16/14  Transition Care Management Follow-up Telephone Call  How have you been since you were released from the hospital? Pt states that she is still feeling "weak and dizzy"    Do you understand why you were in the hospital? YES, high blood pressure, headache and hematemesis    Do you understand the discharge instrcutions? YES  Items Reviewed:  Medications reviewed: YES  Allergies reviewed:   Dietary changes reviewed: YES  Referrals reviewed: N/A   Functional Questionnaire:   Activities of Daily Living (ADLs):   She states they are independent in the following: N/A States they require assistance with the following:    Any transportation issues/concerns?: NO   Any patient concerns? YES, wants to know what her echocardiogram showed, she is also concerned about cardiologist not being very concerned about high blood pressure as she is. She is concerned about the bleeding and being on Xarelto.   Confirmed importance and date/time of follow-up visits scheduled: YES, Oct 5th @ 3:30   Confirmed with patient if condition begins to worsen call PCP or go to the ER.  Patient was given the Call-a-Nurse line [phone]: YES

## 2014-09-18 NOTE — Telephone Encounter (Signed)
Patient scheduled for 09/23/14.

## 2014-09-23 ENCOUNTER — Ambulatory Visit (INDEPENDENT_AMBULATORY_CARE_PROVIDER_SITE_OTHER): Payer: Medicare Other | Admitting: Internal Medicine

## 2014-09-23 ENCOUNTER — Encounter: Payer: Self-pay | Admitting: Internal Medicine

## 2014-09-23 VITALS — BP 138/64 | HR 58 | Temp 98.2°F | Resp 14 | Ht 64.0 in | Wt 166.2 lb

## 2014-09-23 DIAGNOSIS — I10 Essential (primary) hypertension: Secondary | ICD-10-CM

## 2014-09-23 DIAGNOSIS — Z7901 Long term (current) use of anticoagulants: Secondary | ICD-10-CM

## 2014-09-23 DIAGNOSIS — I4819 Other persistent atrial fibrillation: Secondary | ICD-10-CM

## 2014-09-23 MED ORDER — PROMETHAZINE HCL 12.5 MG RE SUPP: 12.5000 mg | Freq: Four times a day (QID) | RECTAL | Status: DC | PRN

## 2014-09-23 MED ORDER — TRAMADOL HCL 50 MG PO TABS: 50.0000 mg | ORAL_TABLET | Freq: Four times a day (QID) | ORAL | Status: DC | PRN

## 2014-09-23 MED ORDER — AMLODIPINE BESYLATE 5 MG PO TABS: 5.0000 mg | ORAL_TABLET | Freq: Every day | ORAL | Status: DC

## 2014-09-23 NOTE — Progress Notes (Signed)
Pre-visit discussion using our clinic review tool. No additional management support is needed unless otherwise documented below in the visit note.  

## 2014-09-23 NOTE — Progress Notes (Signed)
Patient ID: [name] [name], female   DOB: 08-Apr-1936, 78 y.o.   MRN: [id number] Patient Active Problem List   Diagnosis Date Noted  . Long term current use of anticoagulant therapy 09/25/2014  . HH (hiatus hernia) 04/20/2014  . S/P hysterectomy 11/15/2013  . Tachycardia induced cardiomyopathy 07/20/2013  . Unspecified vitamin D deficiency 04/24/2013  . Family history of breast cancer in female 04/23/2013  . Routine general medical examination at a health care facility 04/23/2013  . Preop cardiovascular exam 04/09/2013  . Obesity 06/30/2012  . Fatigue 06/22/2012  . History of [place] spotted fever 06/22/2012  . Persistent atrial fibrillation 05/09/2012  . Anxiety and depression 05/09/2012  . TACHYCARDIA 02/01/2011  . HYPERLIPIDEMIA-MIXED 07/08/2010  . Essential hypertension 03/05/2009  . HEADACHE, CHRONIC 03/05/2009  . DYSPNEA 03/05/2009    Subjective:  CC:   Chief Complaint  Patient presents with  . Follow-up    From hospital  with c/o severe headache and increased BP .    HPI:   [name] [name] is a 78 y.o. female who presents for  Hospital follow up , was admitted on Sept 26 to 28 for malignant hypertension,  severe headaches and hematemesis.  Symptoms started with elevated bp despite starting low dose norvasc,  Then severe headache started,  Then became nauseated and started vomiting. Noticed blood in the bowl and called EMS.   EGD was postponed for a dya due to pulmonary edema on chest x ray/exam and Xarelto was stopped at admission.  By the time EGD was done, there were nos signs of GI bleed.  She was discharged norvasc 2.5 and lisinopril 20 mg dail.  Dr.  Fletcher Anon added clonidine but she did not tolerate it.  Has increased amlodipine to 5 mg daily   Patient upset today and very frustrated.  Feels terrible,  Tired and worn out .  Feels that xarelto is the cause.  She is NOT going to keep taking it due to recrrent hematemesis   Has had blood in the stool for several  months . Found by Defrancesco during her annual exam  EGD/normal .  Stool is turning a light pink again.  She is afraid of the drug, thinks it's going to kill her before she has a chance to have a stroke    Past Medical History  Diagnosis Date  . Atrial fibrillation     Paroxysmal, hx of  . Cardiomyopathy   . Dyspnea   . Headache(784.0)     chronic  . Hypertension   . Pre-syncope   . Chronic kidney disease     acute renal failure secondary to dehydration which is now resolved  . Light headedness     due to dehydration  . Atrial flutter 02/2011    s/p cardioversion   . Hyperlipidemia     Past Surgical History  Procedure Laterality Date  . Cardioversion      x 3  . Cholecystectomy    . Appendectomy    . Multiple orthopedic procedures    . Mastectomy  1986    Bilateral with silicone  breast implants, s/p saline replacements  . Cardiac catheterization    . Combined augmentation mammaplasty and abdominoplasty    . Abdominal hysterectomy  1990  . Total knee arthroplasty Left   . Cardioversion         The following portions of the patient's history were reviewed and updated as appropriate: Allergies, current medications, and problem list.    Review of  Systems:   Patient denies headache, fevers, malaise, unintentional weight loss, skin rash, eye pain, sinus congestion and sinus pain, sore throat, dysphagia,  hemoptysis , cough, dyspnea, wheezing, chest pain, palpitations, orthopnea, edema, abdominal pain, nausea, melena, diarrhea, constipation, flank pain, dysuria, hematuria, urinary  Frequency, nocturia, numbness, tingling, seizures,  Focal weakness, Loss of consciousness,  Tremor, insomnia, depression, anxiety, and suicidal ideation.     History   Social History  . Marital Status: Married    Spouse Name: N/A    Number of Children: 1  . Years of Education: N/A   Occupational History  .     Social History Main Topics  . Smoking status: Never Smoker   .  Smokeless tobacco: Never Used  . Alcohol Use: No  . Drug Use: No  . Sexual Activity: No   Other Topics Concern  . Not on file   Social History Narrative  . No narrative on file    Objective:  Filed Vitals:   09/23/14 1531  BP: 138/64  Pulse: 58  Temp: 98.2 F (36.8 C)  Resp: 14     General appearance: alert, cooperative and appears stated age Ears: normal TM's and external ear canals both ears Throat: lips, mucosa, and tongue normal; teeth and gums normal Neck: no adenopathy, no carotid bruit, supple, symmetrical, trachea midline and thyroid not enlarged, symmetric, no tenderness/mass/nodules Back: symmetric, no curvature. ROM normal. No CVA tenderness. Lungs: clear to auscultation bilaterally Heart: regular rate and rhythm, S1, S2 normal, no murmur, click, rub or gallop Abdomen: soft, non-tender; bowel sounds normal; no masses,  no organomegaly Pulses: 2+ and symmetric Skin: Skin color, texture, turgor normal. No rashes or lesions Lymph nodes: Cervical, supraclavicular, and axillary nodes normal.  Assessment and Plan:  Essential hypertension Well controlled on current regimen. Renal function stable, no changes today.  Long term current use of anticoagulant therapy Patient has had at least two episodes of GI bleeding while on Arlo and refuses to continue taking it.  Advised to resume full strength aspirin.  Persistent atrial fibrillation Rate controlled .  Recent admission with pulmonary edema on exam.  Sep[t 26 2015  EF done during  [facility] admission was notable for borderline LVH  ER 55 to 93% ,  No diastolic dysfunction    Updated Medication List Outpatient Encounter Prescriptions as of 09/23/2014  Medication Sig  . amiodarone (PACERONE) 200 MG tablet Take 1 tablet (200 mg total) by mouth daily.  . furosemide (LASIX) 20 MG tablet TAKE ONE (1) TABLET EACH DAY  . lisinopril (PRINIVIL,ZESTRIL) 20 MG tablet Take 1 tablet (20 mg total) by mouth daily.  [name] omeprazole  (PRILOSEC) 20 MG capsule TAKE ONE CAPSULE BY MOUTH TWICE A DAY  . Red Yeast Rice Extract (RED YEAST RICE PO) Take 1,200 mg by mouth 2 (two) times daily.   . traMADol (ULTRAM) 50 MG tablet Take 1 tablet (50 mg total) by mouth every 6 (six) hours as needed. For pain  . [DISCONTINUED] amLODipine (NORVASC) 2.5 MG tablet Take 1 tablet (2.5 mg total) by mouth daily.  . [DISCONTINUED] amLODipine (NORVASC) 2.5 MG tablet Take 5 mg by mouth daily.  . [DISCONTINUED] fluticasone (FLONASE) 50 MCG/ACT nasal spray Place 2 sprays into both nostrils daily.  . [DISCONTINUED] rivaroxaban (XARELTO) 20 MG TABS tablet Take 1 tablet (20 mg total) by mouth daily.  . [DISCONTINUED] traMADol (ULTRAM) 50 MG tablet Take 50 mg by mouth as needed. For pain   . promethazine (PHENERGAN) 12.5 MG suppository Place  1 suppository (12.5 mg total) rectally every 6 (six) hours as needed for nausea or vomiting.  . [DISCONTINUED] amLODipine (NORVASC) 5 MG tablet Take 1 tablet (5 mg total) by mouth daily.  . [DISCONTINUED] Cyanocobalamin (B-12) 2500 MCG TABS Take by mouth daily.     Orders Placed This Encounter  Procedures  . CBC and differential  . Basic metabolic panel    No Follow-up on file.

## 2014-09-23 NOTE — Patient Instructions (Signed)
I am increasing the amlodipine to 5 mg daily  I am refilling the tramadol for pain ,  And adding promethazine suppositories for management of nausea to prevent vomiting.  It is sedating.  Stat a full strength aspirin daily when you stop the Xarelto  (324 mg )

## 2014-09-24 ENCOUNTER — Ambulatory Visit (INDEPENDENT_AMBULATORY_CARE_PROVIDER_SITE_OTHER): Payer: Medicare Other | Admitting: Cardiovascular Disease

## 2014-09-24 ENCOUNTER — Encounter: Payer: Self-pay | Admitting: Cardiovascular Disease

## 2014-09-24 VITALS — BP 128/70 | HR 57 | Ht 64.5 in | Wt 166.5 lb

## 2014-09-24 DIAGNOSIS — I10 Essential (primary) hypertension: Secondary | ICD-10-CM

## 2014-09-24 DIAGNOSIS — I481 Persistent atrial fibrillation: Secondary | ICD-10-CM

## 2014-09-24 DIAGNOSIS — I4819 Other persistent atrial fibrillation: Secondary | ICD-10-CM

## 2014-09-24 MED ORDER — ASPIRIN 81 MG PO TABS: 81.0000 mg | ORAL_TABLET | Freq: Every day | ORAL | Status: DC

## 2014-09-24 NOTE — Assessment & Plan Note (Signed)
Blood pressure seems to be reasonably controlled now after increasing the dose of amlodipine. She reports not responding to as needed clonidine. His blood pressure starts going up again, I will consider adding hydralazine as needed. She tends to have labile hypertension.

## 2014-09-24 NOTE — Progress Notes (Signed)
HPI  [name] is a 78 year old female who is here today for a followup visit.  She has known history of persistent atrial fibrillation/flutter status post multiple cardioversions in the past. Most recent echocardiogram in July of 2014 showed low normal LV systolic function with ejection fraction of 50-55%. 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 She had cardiac catheterization twice in the past without significant coronary artery disease.  She has been on Xarelto for anticoagulation.  Most recent cardioversion was in August of 2014. She did have bradycardia post cardioversion. She had multiple emergency room visits in the last few months for severely elevated blood pressure with nausea and headache. CT scan of the head showed no evidence of bleeding.  She was are present at [facility] where she presented with hematemesis and elevated blood pressure. Echocardiogram showed normal LV systolic function with moderately dilated left atrium, mild mitral regurgitation, moderate tricuspid regurgitation and moderate to severe pulmonary hypertension. EGD showed no source of bleeding. Xarelto was held. She was asked to resume anticoagulation after hospital discharge but has been extremely anxious about taking it. She does not want to take it even if she is at risk of stroke. She had previous bleeding complications with warfarin as well.  Allergies  Allergen Reactions  . Biaxin [Clarithromycin]   . Codeine   . Iodine   . Warfarin Sodium      Current Outpatient Prescriptions on File Prior to Visit  Medication Sig Dispense Refill  . amiodarone (PACERONE) 200 MG tablet Take 1 tablet (200 mg total) by mouth daily.  30 tablet  6  . furosemide (LASIX) 20 MG tablet TAKE ONE (1) TABLET EACH DAY  30 tablet  5  . lisinopril (PRINIVIL,ZESTRIL) 20 MG tablet Take 1 tablet (20 mg total) by mouth daily.  90 tablet  3  . omeprazole (PRILOSEC) 20 MG capsule TAKE ONE CAPSULE BY  MOUTH TWICE A DAY  90 capsule  2  . promethazine (PHENERGAN) 12.5 MG suppository Place 1 suppository (12.5 mg total) rectally every 6 (six) hours as needed for nausea or vomiting.  12 each  0  . Red Yeast Rice Extract (RED YEAST RICE PO) Take 1,200 mg by mouth 2 (two) times daily.       . traMADol (ULTRAM) 50 MG tablet Take 1 tablet (50 mg total) by mouth every 6 (six) hours as needed. For pain  90 tablet  3   No current facility-administered medications on file prior to visit.     Past Medical History  Diagnosis Date  . Atrial fibrillation     Paroxysmal, hx of  . Cardiomyopathy   . Dyspnea   . Headache(784.0)     chronic  . Hypertension   . Pre-syncope   . Chronic kidney disease     acute renal failure secondary to dehydration which is now resolved  . Light headedness     due to dehydration  . Atrial flutter 02/2011    s/p cardioversion   . Hyperlipidemia      Past Surgical History  Procedure Laterality Date  . Cardioversion      x 3  . Cholecystectomy    . Appendectomy    . Multiple orthopedic procedures    . Mastectomy  1986    Bilateral with silicone  breast implants, s/p saline replacements  . Cardiac catheterization    . Combined augmentation mammaplasty and abdominoplasty    . Abdominal hysterectomy  1990  . Total  knee arthroplasty Left   . Cardioversion       Family History  Problem Relation Age of Onset  . Heart disease Mother   . Cancer Father     stomach  . Cancer Sister     breast  . Cancer Maternal Grandmother     breast  . Cancer Sister     breast  . Diabetes Neg Hx   . Heart disease Son     found at autopsy     History   Social History  . Marital Status: Married    Spouse Name: N/A    Number of Children: 1  . Years of Education: N/A   Occupational History  .     Social History Main Topics  . Smoking status: Never Smoker   . Smokeless tobacco: Never Used  . Alcohol Use: No  . Drug Use: No  . Sexual Activity: No   Other  Topics Concern  . Not on file   Social History Narrative  . No narrative on file     PHYSICAL EXAM   BP 128/70  Pulse 57  Ht 5' 4.5" (1.638 m)  Wt 166 lb 8 oz (75.524 kg)  BMI 28.15 kg/m2 Constitutional: She is oriented to person, place, and time. She appears well-developed and well-nourished. No distress.  HENT: No nasal discharge.  Head: Normocephalic and atraumatic.  Eyes: Pupils are equal and round. Right eye exhibits no discharge. Left eye exhibits no discharge.  Neck: Normal range of motion. Neck supple. No JVD present. No thyromegaly present.  Cardiovascular: Normal rate, irregular rhythm, normal heart sounds. Exam reveals no gallop and no friction rub. No murmur heard.  Pulmonary/Chest: Effort normal and breath sounds normal. No stridor. No respiratory distress. She has no wheezes. She has no rales. She exhibits no tenderness.  Abdominal: Soft. Bowel sounds are normal. She exhibits no distension. There is no tenderness. There is no rebound and no guarding.  Musculoskeletal: Normal range of motion. She exhibits trace edema and no tenderness.  Neurological: She is alert and oriented to person, place, and time. Coordination normal.  Skin: Skin is warm and dry. No rash noted. She is not diaphoretic. No erythema. No pallor.  Psychiatric: She has a normal mood and affect. Her behavior is normal. Judgment and thought content normal.     EKG: Sinus  Bradycardia  -Right bundle branch block.   ABNORMAL    ASSESSMENT AND PLAN

## 2014-09-24 NOTE — Patient Instructions (Signed)
Your physician has recommended you make the following change in your medication:  Start Aspirin 81 mg once daily   Your physician recommends that you schedule a follow-up appointment in:  2 months

## 2014-09-24 NOTE — Assessment & Plan Note (Signed)
She is maintaining in sinus rhythm with amiodarone. She is extremely anxious and worried about resuming anticoagulation given his recent mild GI bleed which likely was due to Mallory-Weiss tear related to recurrent vomiting. She wants to avoid anticoagulation for now and she understands the risk of stroke. I asked her to start aspirin 81 mg once daily. I will consider resuming anticoagulation maybe with a different agent such as Eliquis in the near future.

## 2014-09-25 ENCOUNTER — Encounter: Payer: Self-pay | Admitting: Internal Medicine

## 2014-09-25 DIAGNOSIS — Z7901 Long term (current) use of anticoagulants: Secondary | ICD-10-CM | POA: Insufficient documentation

## 2014-09-25 NOTE — Assessment & Plan Note (Signed)
Rate controlled .  Recent admission with pulmonary edema on exam.  Sep[t 26 2015  EF done during  [facility] admission was notable for borderline LVH  ER 55 to 50% ,  No diastolic dysfunction

## 2014-09-25 NOTE — Assessment & Plan Note (Addendum)
Patient has had at least two episodes of GI bleeding while on Arlo and refuses to continue taking it.  Advised to resume full strength aspirin.

## 2014-09-25 NOTE — Assessment & Plan Note (Signed)
Well controlled on current regimen. Renal function stable, no changes today. 

## 2014-10-15 DIAGNOSIS — I481 Persistent atrial fibrillation: Secondary | ICD-10-CM

## 2014-10-15 DIAGNOSIS — I10 Essential (primary) hypertension: Secondary | ICD-10-CM

## 2014-10-15 DIAGNOSIS — Z7901 Long term (current) use of anticoagulants: Secondary | ICD-10-CM

## 2014-10-19 ENCOUNTER — Telehealth: Payer: Self-pay

## 2014-10-19 NOTE — Telephone Encounter (Signed)
LVM for pt to call back.   RE: scheduling AWV with NP or PA if pt allows.

## 2014-10-31 ENCOUNTER — Encounter: Payer: Self-pay | Admitting: Internal Medicine

## 2014-11-08 ENCOUNTER — Other Ambulatory Visit: Payer: Self-pay | Admitting: Internal Medicine

## 2014-11-22 ENCOUNTER — Ambulatory Visit: Payer: Medicare Other | Admitting: Cardiovascular Disease

## 2014-11-25 ENCOUNTER — Ambulatory Visit (INDEPENDENT_AMBULATORY_CARE_PROVIDER_SITE_OTHER): Payer: Medicare Other | Admitting: Cardiovascular Disease

## 2014-11-25 ENCOUNTER — Encounter: Payer: Self-pay | Admitting: Cardiovascular Disease

## 2014-11-25 ENCOUNTER — Telehealth: Payer: Self-pay | Admitting: *Deleted

## 2014-11-25 VITALS — BP 104/58 | HR 51 | Ht 64.5 in | Wt 168.8 lb

## 2014-11-25 DIAGNOSIS — I10 Essential (primary) hypertension: Secondary | ICD-10-CM

## 2014-11-25 DIAGNOSIS — I43 Cardiomyopathy in diseases classified elsewhere: Secondary | ICD-10-CM

## 2014-11-25 DIAGNOSIS — I4819 Other persistent atrial fibrillation: Secondary | ICD-10-CM

## 2014-11-25 DIAGNOSIS — R000 Tachycardia, unspecified: Secondary | ICD-10-CM

## 2014-11-25 DIAGNOSIS — I481 Persistent atrial fibrillation: Secondary | ICD-10-CM

## 2014-11-25 MED ORDER — APIXABAN 5 MG PO TABS: 5.0000 mg | ORAL_TABLET | Freq: Two times a day (BID) | ORAL | Status: DC

## 2014-11-25 NOTE — Patient Instructions (Signed)
Stop Aspirin.   Start Eliquis 5 mg twice daily.   Follow up in 3 months.

## 2014-11-25 NOTE — Assessment & Plan Note (Signed)
Blood pressure is now well controlled on current medications. 

## 2014-11-25 NOTE — Assessment & Plan Note (Signed)
Most recent ejection fraction was normal.

## 2014-11-25 NOTE — Telephone Encounter (Signed)
Needs prior auth on Eloquis.  Need samples as well.

## 2014-11-25 NOTE — Progress Notes (Signed)
HPI  [name] [name] is a 78 year old female who is here today for a followup visit.  She has known history of persistent atrial fibrillation/flutter status post multiple cardioversions in the past. 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 She had cardiac catheterization twice in the past without significant coronary artery disease.  She has been on Xarelto for anticoagulation.  Most recent cardioversion was in August of 2014. She did have bradycardia post cardioversion. She has known history of labile hypertension. She did have an emergency room visit for hematemesis and elevated blood pressure. Echocardiogram showed normal LV systolic function with moderately dilated left atrium, mild mitral regurgitation, moderate tricuspid regurgitation and moderate to severe pulmonary hypertension. EGD showed no source of bleeding. Xarelto was held. She has been extremely concerned about resuming anticoagulation. She had previous bleeding complications with warfarin as well. She has been doing reasonably well with no chest pain, shortness of breath or palpitations.  Allergies  Allergen Reactions  . Biaxin [Clarithromycin]   . Codeine   . Iodine   . Warfarin Sodium      Current Outpatient Prescriptions on File Prior to Visit  Medication Sig Dispense Refill  . amiodarone (PACERONE) 200 MG tablet Take 1 tablet (200 mg total) by mouth daily. 30 tablet 6  . amLODipine (NORVASC) 5 MG tablet Take 5 mg by mouth daily.    [name] aspirin 81 MG tablet Take 1 tablet (81 mg total) by mouth daily. 30 tablet   . furosemide (LASIX) 20 MG tablet TAKE ONE (1) TABLET EACH DAY 30 tablet 5  . lisinopril (PRINIVIL,ZESTRIL) 20 MG tablet Take 1 tablet (20 mg total) by mouth daily. 90 tablet 3  . omeprazole (PRILOSEC) 20 MG capsule TAKE ONE (1) CAPSULE BY MOUTH 2 TIMES DAILY 180 capsule 1  . promethazine (PHENERGAN) 12.5 MG suppository Place 1 suppository (12.5 mg total) rectally every 6  (six) hours as needed for nausea or vomiting. 12 each 0  . Red Yeast Rice Extract (RED YEAST RICE PO) Take 1,200 mg by mouth 2 (two) times daily.     . traMADol (ULTRAM) 50 MG tablet Take 1 tablet (50 mg total) by mouth every 6 (six) hours as needed. For pain 90 tablet 3   No current facility-administered medications on file prior to visit.     Past Medical History  Diagnosis Date  . Atrial fibrillation     Paroxysmal, hx of  . Cardiomyopathy   . Dyspnea   . Headache(784.0)     chronic  . Hypertension   . Pre-syncope   . Chronic kidney disease     acute renal failure secondary to dehydration which is now resolved  . Light headedness     due to dehydration  . Atrial flutter 02/2011    s/p cardioversion   . Hyperlipidemia      Past Surgical History  Procedure Laterality Date  . Cardioversion      x 3  . Cholecystectomy    . Appendectomy    . Multiple orthopedic procedures    . Mastectomy  1986    Bilateral with silicone  breast implants, s/p saline replacements  . Cardiac catheterization    . Combined augmentation mammaplasty and abdominoplasty    . Abdominal hysterectomy  1990  . Total knee arthroplasty Left   . Cardioversion       Family History  Problem Relation Age of Onset  . Heart disease Mother   . Cancer Father  stomach  . Cancer Sister     breast  . Cancer Maternal Grandmother     breast  . Cancer Sister     breast  . Diabetes Neg Hx   . Heart disease Son     found at autopsy     History   Social History  . Marital Status: Married    Spouse Name: N/A    Number of Children: 1  . Years of Education: N/A   Occupational History  .     Social History Main Topics  . Smoking status: Never Smoker   . Smokeless tobacco: Never Used  . Alcohol Use: No  . Drug Use: No  . Sexual Activity: No   Other Topics Concern  . Not on file   Social History Narrative     PHYSICAL EXAM   BP 104/58 mmHg  Pulse 51  Ht 5' 4.5" (1.638 m)  Wt 168  lb 12 oz (76.544 kg)  BMI 28.53 kg/m2 Constitutional: She is oriented to person, place, and time. She appears well-developed and well-nourished. No distress.  HENT: No nasal discharge.  Head: Normocephalic and atraumatic.  Eyes: Pupils are equal and round. Right eye exhibits no discharge. Left eye exhibits no discharge.  Neck: Normal range of motion. Neck supple. No JVD present. No thyromegaly present.  Cardiovascular: Normal rate, irregular rhythm, normal heart sounds. Exam reveals no gallop and no friction rub. No murmur heard.  Pulmonary/Chest: Effort normal and breath sounds normal. No stridor. No respiratory distress. She has no wheezes. She has no rales. She exhibits no tenderness.  Abdominal: Soft. Bowel sounds are normal. She exhibits no distension. There is no tenderness. There is no rebound and no guarding.  Musculoskeletal: Normal range of motion. She exhibits trace edema and no tenderness.  Neurological: She is alert and oriented to person, place, and time. Coordination normal.  Skin: Skin is warm and dry. No rash noted. She is not diaphoretic. No erythema. No pallor.  Psychiatric: She has a normal mood and affect. Her behavior is normal. Judgment and thought content normal.     EKG: Sinus  Bradycardia  -Right bundle branch block.   ABNORMAL    ASSESSMENT AND PLAN

## 2014-11-25 NOTE — Assessment & Plan Note (Signed)
She is maintaining in sinus rhythm on amiodarone. She has been extremely hesitant to resume anticoagulation. I had a prolonged discussion with her about the risk of stroke. Recent studies have shown increased risk of GI bleeding with Xarelto when compared to warfarin. That has not been the case with Eliquis. I thus recommended anticoagulation with Eliquis 5 mg twice daily. Discontinue aspirin. Continue to monitor for any signs of bleeding.

## 2014-11-25 NOTE — Telephone Encounter (Signed)
Samples given of eliquis 5 mg take one tablet twice a day. [employer] to get prior authorization.

## 2014-11-27 ENCOUNTER — Other Ambulatory Visit: Payer: Self-pay

## 2014-11-30 ENCOUNTER — Emergency Department: Payer: Self-pay | Admitting: Internal Medicine

## 2014-12-02 NOTE — Telephone Encounter (Signed)
Notified pharmacy prior authorization for Eliquis has been approved through 12//10/2015

## 2014-12-03 ENCOUNTER — Telehealth: Payer: Self-pay | Admitting: Internal Medicine

## 2014-12-03 ENCOUNTER — Ambulatory Visit: Payer: Medicare Other | Admitting: Internal Medicine

## 2014-12-03 NOTE — Telephone Encounter (Signed)
Patient scheduled for wound to right arm patient stated is red and hot.

## 2014-12-11 ENCOUNTER — Encounter: Payer: Self-pay | Admitting: Internal Medicine

## 2014-12-11 ENCOUNTER — Ambulatory Visit (INDEPENDENT_AMBULATORY_CARE_PROVIDER_SITE_OTHER): Payer: Medicare Other | Admitting: Internal Medicine

## 2014-12-11 VITALS — BP 102/64 | HR 60 | Temp 98.5°F | Resp 14 | Ht 65.0 in | Wt 169.2 lb

## 2014-12-11 DIAGNOSIS — Z90710 Acquired absence of both cervix and uterus: Secondary | ICD-10-CM

## 2014-12-11 DIAGNOSIS — Z13820 Encounter for screening for osteoporosis: Secondary | ICD-10-CM

## 2014-12-11 DIAGNOSIS — Z0000 Encounter for general adult medical examination without abnormal findings: Secondary | ICD-10-CM

## 2014-12-11 DIAGNOSIS — E559 Vitamin D deficiency, unspecified: Secondary | ICD-10-CM

## 2014-12-11 DIAGNOSIS — Z9013 Acquired absence of bilateral breasts and nipples: Secondary | ICD-10-CM

## 2014-12-11 DIAGNOSIS — R5383 Other fatigue: Secondary | ICD-10-CM

## 2014-12-11 DIAGNOSIS — Z23 Encounter for immunization: Secondary | ICD-10-CM

## 2014-12-11 DIAGNOSIS — Z9079 Acquired absence of other genital organ(s): Secondary | ICD-10-CM

## 2014-12-11 DIAGNOSIS — Z7901 Long term (current) use of anticoagulants: Secondary | ICD-10-CM

## 2014-12-11 DIAGNOSIS — E785 Hyperlipidemia, unspecified: Secondary | ICD-10-CM

## 2014-12-11 DIAGNOSIS — Z90722 Acquired absence of ovaries, bilateral: Secondary | ICD-10-CM

## 2014-12-11 DIAGNOSIS — E669 Obesity, unspecified: Secondary | ICD-10-CM

## 2014-12-11 MED ORDER — ONDANSETRON 8 MG PO TBDP: 8.0000 mg | ORAL_TABLET | Freq: Three times a day (TID) | ORAL | Status: DC | PRN

## 2014-12-11 MED ORDER — HYDROCODONE-ACETAMINOPHEN 5-325 MG PO TABS: 1.0000 | ORAL_TABLET | Freq: Four times a day (QID) | ORAL | Status: DC | PRN

## 2014-12-11 NOTE — Progress Notes (Signed)
Patient ID: [name] [name], female   DOB: 1936/03/23, 78 y.o.   MRN: [id number] \ The patient is here for annual Medicare wellness examination and management of other chronic and acute problems.   Had a fall onto knee while working in her yard spreading pine needles.  Turned around and  twisted her ankle on an upright paver,  fell against trailer and sprained her wrist and hit her head.  Avulsed some skin off the wrist. Happened  Thursday Dec 10th.  Left Wrist swelled up to twice normal size so she went to  ER on Dec 12th. Head and wrist imaged,  wrist  not fractured, no SDH. [name] Placed in a  soft cast for sprain,  After 24 hours she removed it due to pressure the cast was placing on the on the avulsion.  Then on Tuesday went to Walk in clinic at [place], treated for cellulitis from infected avulsion with  doxycycline and topical  abx.  Right knee started hurting more and more , made appt with [facility].  DJD and OA ,  Received a cortisone shot,.  Was taking 81 mg asa until last Monday based on Arida's advice,  But he the prescribed Eliquis bid , so far so good,    The risk factors are reflected in the social history.  The roster of all physicians providing medical care to patient - is listed in the Snapshot section of the chart.  Activities of daily living:  The patient is 100% independent in all ADLs: dressing, toileting, feeding as well as independent mobility  Home safety : The patient has smoke detectors in the home. They wear seatbelts.  There are no firearms at home. There is no violence in the home.   There is no risks for hepatitis, STDs or HIV. There is no   history of blood transfusion. They have no travel history to infectious disease endemic areas of the world.  The patient has seen their dentist in the last six month. They have seen their eye doctor in the last year. They admit to slight hearing difficulty with regard to whispered voices and some television programs.  They have deferred  audiologic testing in the last year.  They do not  have excessive sun exposure. Discussed the need for sun protection: hats, long sleeves and use of sunscreen if there is significant sun exposure.   Diet: the importance of a healthy diet is discussed. They do have a healthy diet.  The benefits of regular aerobic exercise were discussed. She walks 4 times per week ,  20 minutes.   Depression screen: there are no signs or vegative symptoms of depression- irritability, change in appetite, anhedonia, sadness/tearfullness.  Cognitive assessment: the patient manages all their financial and personal affairs and is actively engaged. They could relate day,date,year and events; recalled 2/3 objects at 3 minutes; performed clock-face test normally.  The following portions of the patient's history were reviewed and updated as appropriate: allergies, current medications, past family history, past medical history,  past surgical history, past social history  and problem list.  Visual acuity was not assessed per patient preference since she has regular follow up with her ophthalmologist. Hearing and body mass index were assessed and reviewed.   During the course of the visit the patient was educated and counseled about appropriate screening and preventive services including : fall prevention , diabetes screening, nutrition counseling, colorectal cancer screening, and recommended immunizations.   Objective: BP 102/64 mmHg  Pulse 60  Temp(Src) 98.5 F (36.9 C) (Oral)  Resp 14  Ht 5\' 5"  (1.651 m)  Wt 169 lb 4 oz (76.771 kg)  BMI 28.16 kg/m2  SpO2 96%   General appearance: alert, cooperative and appears stated age Head: Normocephalic, without obvious abnormality, atraumatic Eyes: conjunctivae/corneas clear. PERRL, EOM's intact. Fundi benign. Ears: normal TM's and external ear canals both ears Nose: Nares normal. Septum midline. Mucosa normal. No drainage or sinus tenderness. Throat: lips, mucosa, and  tongue normal; teeth and gums normal Neck: no adenopathy, no carotid bruit, no JVD, supple, symmetrical, trachea midline and thyroid not enlarged, symmetric, no tenderness/mass/nodules Lungs: clear to auscultation bilaterally  Heart: regular rate and rhythm, S1, S2 normal, no murmur, click, rub or gallop Abdomen: soft, non-tender; bowel sounds normal; no masses,  no organomegaly Extremities: extremities normal, atraumatic, no cyanosis or edema Pulses: 2+ and symmetric Skin: Skin color, texture, turgor normal. No rashes or lesions Neurologic: Alert and oriented X 3, normal strength and tone. Normal symmetric reflexes. Normal coordination and gait.   Assessment and Plan:   Problem List Items Addressed This Visit    Fatigue   Relevant Orders      CBC with Differential      Comprehensive metabolic panel      TSH   Long term current use of anticoagulant therapy    Patient has consented to taking Eliquis and thus far has had a nasty fall with fortunately,  no serious injuries.       Medicare annual wellness visit, subsequent    Annual Medicare wellness  exam was done as well as a comprehensive physical exam and management of acute and chronic conditions .  During the course of the visit the patient was educated and counseled about appropriate screening and preventive services including : fall prevention , diabetes screening, nutrition counseling, colorectal cancer screening, and recommended immunizations.  Printed recommendations for health maintenance screenings was given.     Obesity    I have addressed  BMI and recommended a low glycemic index diet utilizing smaller more frequent meals to increase metabolism.  I have also recommended that patient start exercising with a goal of 30 minutes of aerobic exercise a minimum of 5 days per week. Screening for lipid disorders, thyroid and diabetes to be done annually.      S/P bilateral mastectomy   S/P TAH-BSO    Other Visit Diagnoses     Osteoporosis screening    -  Primary    Relevant Orders       DG Bone Density    Vitamin D deficiency        Relevant Orders       Lipid panel    Hyperlipidemia        Relevant Orders       Vit D  25 hydroxy (rtn osteoporosis monitoring)    Need for vaccination with 13-polyvalent pneumococcal conjugate vaccine        Relevant Orders       Pneumococcal conjugate vaccine 13-valent (Completed)

## 2014-12-11 NOTE — Progress Notes (Signed)
Pre visit review using our clinic review tool, if applicable. No additional management support is needed unless otherwise documented below in the visit note. 

## 2014-12-11 NOTE — Patient Instructions (Addendum)
Try Salon Pas patches for your knee pain  I have prescribed vicodin as needed for severe pain ,  It will constipate you .  Use Ex Lax or dulcolax   I have sent in zofran orally dissolvable tablets to keep on hand for nausea  You can take an extra dose of lisinopril in the evening for BP > 160  Bone Density test ordered  Keep doing annual mammograms every  May   Health Maintenance Adopting a healthy lifestyle and getting preventive care can go a long way to promote health and wellness. Talk with your health care provider about what schedule of regular examinations is right for you. This is a good chance for you to check in with your provider about disease prevention and staying healthy. In between checkups, there are plenty of things you can do on your own. Experts have done a lot of research about which lifestyle changes and preventive measures are most likely to keep you healthy. Ask your health care provider for more information. WEIGHT AND DIET  Eat a healthy diet  Be sure to include plenty of vegetables, fruits, low-fat dairy products, and lean protein.  Do not eat a lot of foods high in solid fats, added sugars, or salt.  Get regular exercise. This is one of the most important things you can do for your health.  Most adults should exercise for at least 150 minutes each week. The exercise should increase your heart rate and make you sweat (moderate-intensity exercise).  Most adults should also do strengthening exercises at least twice a week. This is in addition to the moderate-intensity exercise.  Maintain a healthy weight  Body mass index (BMI) is a measurement that can be used to identify possible weight problems. It estimates body fat based on height and weight. Your health care provider can help determine your BMI and help you achieve or maintain a healthy weight.  For females 10 years of age and older:   A BMI below 18.5 is considered underweight.  A BMI of 18.5 to 24.9  is normal.  A BMI of 25 to 29.9 is considered overweight.  A BMI of 30 and above is considered obese.  Watch levels of cholesterol and blood lipids  You should start having your blood tested for lipids and cholesterol at 76 years of age, then have this test every 5 years.  You may need to have your cholesterol levels checked more often if:  Your lipid or cholesterol levels are high.  You are older than 76 years of age.  You are at high risk for heart disease.  CANCER SCREENING   Lung Cancer  Lung cancer screening is recommended for adults 57-67 years old who are at high risk for lung cancer because of a history of smoking.  A yearly low-dose CT scan of the lungs is recommended for people who:  Currently smoke.  Have quit within the past 15 years.  Have at least a 30-pack-year history of smoking. A pack year is smoking an average of one pack of cigarettes a day for 1 year.  Yearly screening should continue until it has been 15 years since you quit.  Yearly screening should stop if you develop a health problem that would prevent you from having lung cancer treatment.  Breast Cancer  Practice breast self-awareness. This means understanding how your breasts normally appear and feel.  It also means doing regular breast self-exams. Let your health care provider know about any changes, no matter  how small.  If you are in your 20s or 30s, you should have a clinical breast exam (CBE) by a health care provider every 1-3 years as part of a regular health exam.  If you are 36 or older, have a CBE every year. Also consider having a breast X-ray (mammogram) every year.  If you have a family history of breast cancer, talk to your health care provider about genetic screening.  If you are at high risk for breast cancer, talk to your health care provider about having an MRI and a mammogram every year.  Breast cancer gene (BRCA) assessment is recommended for women who have family  members with BRCA-related cancers. BRCA-related cancers include:  Breast.  Ovarian.  Tubal.  Peritoneal cancers.  Results of the assessment will determine the need for genetic counseling and BRCA1 and BRCA2 testing. Cervical Cancer Routine pelvic examinations to screen for cervical cancer are no longer recommended for nonpregnant women who are considered low risk for cancer of the pelvic organs (ovaries, uterus, and vagina) and who do not have symptoms. A pelvic examination may be necessary if you have symptoms including those associated with pelvic infections. Ask your health care provider if a screening pelvic exam is right for you.   The Pap test is the screening test for cervical cancer for women who are considered at risk.  If you had a hysterectomy for a problem that was not cancer or a condition that could lead to cancer, then you no longer need Pap tests.  If you are older than 65 years, and you have had normal Pap tests for the past 10 years, you no longer need to have Pap tests.  If you have had past treatment for cervical cancer or a condition that could lead to cancer, you need Pap tests and screening for cancer for at least 20 years after your treatment.  If you no longer get a Pap test, assess your risk factors if they change (such as having a new sexual partner). This can affect whether you should start being screened again.  Some women have medical problems that increase their chance of getting cervical cancer. If this is the case for you, your health care provider may recommend more frequent screening and Pap tests.  The human papillomavirus (HPV) test is another test that may be used for cervical cancer screening. The HPV test looks for the virus that can cause cell changes in the cervix. The cells collected during the Pap test can be tested for HPV.  The HPV test can be used to screen women 25 years of age and older. Getting tested for HPV can extend the interval  between normal Pap tests from three to five years.  An HPV test also should be used to screen women of any age who have unclear Pap test results.  After 76 years of age, women should have HPV testing as often as Pap tests.  Colorectal Cancer  This type of cancer can be detected and often prevented.  Routine colorectal cancer screening usually begins at 76 years of age and continues through 76 years of age.  Your health care provider may recommend screening at an earlier age if you have risk factors for colon cancer.  Your health care provider may also recommend using home test kits to check for hidden blood in the stool.  A small camera at the end of a tube can be used to examine your colon directly (sigmoidoscopy or colonoscopy). This is done  to check for the earliest forms of colorectal cancer.  Routine screening usually begins at age 69.  Direct examination of the colon should be repeated every 5-10 years through 76 years of age. However, you may need to be screened more often if early forms of precancerous polyps or small growths are found. Skin Cancer  Check your skin from head to toe regularly.  Tell your health care provider about any new moles or changes in moles, especially if there is a change in a mole's shape or color.  Also tell your health care provider if you have a mole that is larger than the size of a pencil eraser.  Always use sunscreen. Apply sunscreen liberally and repeatedly throughout the day.  Protect yourself by wearing long sleeves, pants, a wide-brimmed hat, and sunglasses whenever you are outside. HEART DISEASE, DIABETES, AND HIGH BLOOD PRESSURE   Have your blood pressure checked at least every 1-2 years. High blood pressure causes heart disease and increases the risk of stroke.  If you are between 62 years and 13 years old, ask your health care provider if you should take aspirin to prevent strokes.  Have regular diabetes screenings. This involves  taking a blood sample to check your fasting blood sugar level.  If you are at a normal weight and have a low risk for diabetes, have this test once every three years after 76 years of age.  If you are overweight and have a high risk for diabetes, consider being tested at a younger age or more often. PREVENTING INFECTION  Hepatitis B  If you have a higher risk for hepatitis B, you should be screened for this virus. You are considered at high risk for hepatitis B if:  You were born in a country where hepatitis B is common. Ask your health care provider which countries are considered high risk.  Your parents were born in a high-risk country, and you have not been immunized against hepatitis B (hepatitis B vaccine).  You have HIV or AIDS.  You use needles to inject street drugs.  You live with someone who has hepatitis B.  You have had sex with someone who has hepatitis B.  You get hemodialysis treatment.  You take certain medicines for conditions, including cancer, organ transplantation, and autoimmune conditions. Hepatitis C  Blood testing is recommended for:  Everyone born from [birth year] through 1963.  Anyone with known risk factors for hepatitis C. Sexually transmitted infections (STIs)  You should be screened for sexually transmitted infections (STIs) including gonorrhea and chlamydia if:  You are sexually active and are younger than 76 years of age.  You are older than 76 years of age and your health care provider tells you that you are at risk for this type of infection.  Your sexual activity has changed since you were last screened and you are at an increased risk for chlamydia or gonorrhea. Ask your health care provider if you are at risk.  If you do not have HIV, but are at risk, it may be recommended that you take a prescription medicine daily to prevent HIV infection. This is called pre-exposure prophylaxis (PrEP). You are considered at risk if:  You are sexually active  and do not regularly use condoms or know the HIV status of your partner(s).  You take drugs by injection.  You are sexually active with a partner who has HIV. Talk with your health care provider about whether you are at high risk of being infected with  HIV. If you choose to begin PrEP, you should first be tested for HIV. You should then be tested every 3 months for as long as you are taking PrEP.  PREGNANCY   If you are premenopausal and you may become pregnant, ask your health care provider about preconception counseling.  If you may become pregnant, take 400 to 800 micrograms (mcg) of folic acid every day.  If you want to prevent pregnancy, talk to your health care provider about birth control (contraception). OSTEOPOROSIS AND MENOPAUSE   Osteoporosis is a disease in which the bones lose minerals and strength with aging. This can result in serious bone fractures. Your risk for osteoporosis can be identified using a bone density scan.  If you are 60 years of age or older, or if you are at risk for osteoporosis and fractures, ask your health care provider if you should be screened.  Ask your health care provider whether you should take a calcium or vitamin D supplement to lower your risk for osteoporosis.  Menopause may have certain physical symptoms and risks.  Hormone replacement therapy may reduce some of these symptoms and risks. Talk to your health care provider about whether hormone replacement therapy is right for you.  HOME CARE INSTRUCTIONS   Schedule regular health, dental, and eye exams.  Stay current with your immunizations.   Do not use any tobacco products including cigarettes, chewing tobacco, or electronic cigarettes.  If you are pregnant, do not drink alcohol.  If you are breastfeeding, limit how much and how often you drink alcohol.  Limit alcohol intake to no more than 1 drink per day for nonpregnant women. One drink equals 12 ounces of beer, 5 ounces of wine,  or 1 ounces of hard liquor.  Do not use street drugs.  Do not share needles.  Ask your health care provider for help if you need support or information about quitting drugs.  Tell your health care provider if you often feel depressed.  Tell your health care provider if you have ever been abused or do not feel safe at home. Document Released: 06/21/2011 Document Revised: 04/22/2014 Document Reviewed: 11/07/2013 [facility] Patient Information 2015 [place], [place]. This information is not intended to replace advice given to you by your health care provider. Make sure you discuss any questions you have with your health care provider.

## 2014-12-13 DIAGNOSIS — Z9013 Acquired absence of bilateral breasts and nipples: Secondary | ICD-10-CM | POA: Insufficient documentation

## 2014-12-13 DIAGNOSIS — Z90722 Acquired absence of ovaries, bilateral: Secondary | ICD-10-CM

## 2014-12-13 DIAGNOSIS — Z90710 Acquired absence of both cervix and uterus: Secondary | ICD-10-CM | POA: Insufficient documentation

## 2014-12-13 DIAGNOSIS — Z9079 Acquired absence of other genital organ(s): Secondary | ICD-10-CM

## 2014-12-13 NOTE — Assessment & Plan Note (Signed)
I have addressed  BMI and recommended a low glycemic index diet utilizing smaller more frequent meals to increase metabolism.  I have also recommended that patient start exercising with a goal of 30 minutes of aerobic exercise a minimum of 5 days per week. Screening for lipid disorders, thyroid and diabetes to be done annually 

## 2014-12-13 NOTE — Assessment & Plan Note (Signed)

## 2014-12-13 NOTE — Assessment & Plan Note (Signed)
Patient has consented to taking Eliquis and thus far has had a nasty fall with fortunately,  no serious injuries.

## 2014-12-26 ENCOUNTER — Ambulatory Visit: Payer: Self-pay | Admitting: General Practice

## 2014-12-26 DIAGNOSIS — M25461 Effusion, right knee: Secondary | ICD-10-CM | POA: Diagnosis not present

## 2014-12-26 DIAGNOSIS — S82141D Displaced bicondylar fracture of right tibia, subsequent encounter for closed fracture with routine healing: Secondary | ICD-10-CM | POA: Diagnosis not present

## 2014-12-26 DIAGNOSIS — M1711 Unilateral primary osteoarthritis, right knee: Secondary | ICD-10-CM | POA: Diagnosis not present

## 2014-12-27 ENCOUNTER — Other Ambulatory Visit (INDEPENDENT_AMBULATORY_CARE_PROVIDER_SITE_OTHER): Payer: Medicare Other

## 2014-12-27 ENCOUNTER — Encounter: Payer: Self-pay | Admitting: *Deleted

## 2014-12-27 DIAGNOSIS — E785 Hyperlipidemia, unspecified: Secondary | ICD-10-CM

## 2014-12-27 DIAGNOSIS — E559 Vitamin D deficiency, unspecified: Secondary | ICD-10-CM | POA: Diagnosis not present

## 2014-12-27 DIAGNOSIS — R5383 Other fatigue: Secondary | ICD-10-CM

## 2014-12-27 LAB — CBC WITH DIFFERENTIAL/PLATELET
Basophils Absolute: 0 10*3/uL (ref 0.0–0.1)
Basophils Relative: 0.8 % (ref 0.0–3.0)
Eosinophils Absolute: 0.1 10*3/uL (ref 0.0–0.7)
Eosinophils Relative: 1 % (ref 0.0–5.0)
HCT: 40.9 % (ref 36.0–46.0)
Hemoglobin: 13.3 g/dL (ref 12.0–15.0)
Lymphocytes Relative: 20.6 % (ref 12.0–46.0)
Lymphs Abs: 1.2 10*3/uL (ref 0.7–4.0)
MCHC: 32.5 g/dL (ref 30.0–36.0)
MCV: 96.5 fl (ref 78.0–100.0)
Monocytes Absolute: 0.4 10*3/uL (ref 0.1–1.0)
Monocytes Relative: 7.5 % (ref 3.0–12.0)
Neutro Abs: 4.1 10*3/uL (ref 1.4–7.7)
Neutrophils Relative %: 70.1 % (ref 43.0–77.0)
Platelets: 260 10*3/uL (ref 150.0–400.0)
RBC: 4.24 Mil/uL (ref 3.87–5.11)
RDW: 14.3 % (ref 11.5–15.5)
WBC: 5.9 10*3/uL (ref 4.0–10.5)

## 2014-12-27 LAB — COMPREHENSIVE METABOLIC PANEL
ALT: 17 U/L (ref 0–35)
AST: 17 U/L (ref 0–37)
Albumin: 4.1 g/dL (ref 3.5–5.2)
Alkaline Phosphatase: 55 U/L (ref 39–117)
BUN: 23 mg/dL (ref 6–23)
CO2: 27 mEq/L (ref 19–32)
Calcium: 9.4 mg/dL (ref 8.4–10.5)
Chloride: 106 mEq/L (ref 96–112)
Creatinine, Ser: 1.1 mg/dL (ref 0.4–1.2)
GFR: 51.74 mL/min — ABNORMAL LOW (ref >60.00)
Glucose, Bld: 96 mg/dL (ref 70–99)
Potassium: 4.6 mEq/L (ref 3.5–5.1)
Sodium: 139 mEq/L (ref 135–145)
Total Bilirubin: 0.9 mg/dL (ref 0.2–1.2)
Total Protein: 7.2 g/dL (ref 6.0–8.3)

## 2014-12-27 LAB — LIPID PANEL
Cholesterol: 226 mg/dL — ABNORMAL HIGH (ref 0–200)
HDL: 58 mg/dL (ref >39.00)
LDL Cholesterol: 152 mg/dL — ABNORMAL HIGH (ref 0–99)
NonHDL: 168
Total CHOL/HDL Ratio: 4
Triglycerides: 80 mg/dL (ref 0.0–149.0)
VLDL: 16 mg/dL (ref 0.0–40.0)

## 2014-12-27 LAB — TSH: TSH: 3.15 u[IU]/mL (ref 0.35–4.50)

## 2014-12-27 LAB — VITAMIN D 25 HYDROXY (VIT D DEFICIENCY, FRACTURES): VITD: 12.83 ng/mL — ABNORMAL LOW (ref 30.00–100.00)

## 2014-12-27 MED ORDER — ERGOCALCIFEROL 1.25 MG (50000 UT) PO CAPS: 50000.0000 [IU] | ORAL_CAPSULE | ORAL | Status: DC

## 2014-12-27 NOTE — Addendum Note (Signed)
Addended by: [name] on: 12/27/2014 12:10 PM   Modules accepted: Orders

## 2015-01-03 ENCOUNTER — Telehealth: Payer: Self-pay

## 2015-01-03 NOTE — Telephone Encounter (Signed)
Pt would like to discuss cholesterol medication with Dr. Fletcher Anon Please call.

## 2015-01-03 NOTE — Telephone Encounter (Signed)
Patient stated she had a wellness check with Dr. Vanita Ingles and her cholesterol is elevated (see result in chart)  She has only been taking red yeast rice in the past  She wants to know if Dr. Fletcher Anon thinks she should come for a visit or start a medication for her cholesterol

## 2015-01-03 NOTE — Telephone Encounter (Signed)
She can schedule an appointment with me or Dr. Derrel Nip but this is not urgent.

## 2015-01-07 ENCOUNTER — Telehealth: Payer: Self-pay | Admitting: Cardiovascular Disease

## 2015-01-07 NOTE — Telephone Encounter (Signed)
Patient stated she would like to see Dr. Fletcher Anon about her cholesterol  Patient scheduled for 01/20/15 Patient aware

## 2015-01-07 NOTE — Telephone Encounter (Signed)
Eliquis 5 mg samples placed at front desk for pick up. 

## 2015-01-07 NOTE — Telephone Encounter (Signed)
Patient requesting samples of Eliquis.  She takes 5 mg. Daily.  Please call patient when ready.

## 2015-01-07 NOTE — Telephone Encounter (Signed)
LVM 1/19

## 2015-01-11 ENCOUNTER — Emergency Department: Payer: Self-pay | Admitting: Internal Medicine

## 2015-01-11 DIAGNOSIS — T380X5A Adverse effect of glucocorticoids and synthetic analogues, initial encounter: Secondary | ICD-10-CM | POA: Diagnosis not present

## 2015-01-11 DIAGNOSIS — E860 Dehydration: Secondary | ICD-10-CM | POA: Diagnosis not present

## 2015-01-11 DIAGNOSIS — S0990XA Unspecified injury of head, initial encounter: Secondary | ICD-10-CM | POA: Diagnosis not present

## 2015-01-11 DIAGNOSIS — R51 Headache: Secondary | ICD-10-CM | POA: Diagnosis not present

## 2015-01-11 DIAGNOSIS — Z7902 Long term (current) use of antithrombotics/antiplatelets: Secondary | ICD-10-CM | POA: Diagnosis not present

## 2015-01-11 DIAGNOSIS — R739 Hyperglycemia, unspecified: Secondary | ICD-10-CM | POA: Diagnosis not present

## 2015-01-11 DIAGNOSIS — R42 Dizziness and giddiness: Secondary | ICD-10-CM | POA: Diagnosis not present

## 2015-01-11 DIAGNOSIS — N289 Disorder of kidney and ureter, unspecified: Secondary | ICD-10-CM | POA: Diagnosis not present

## 2015-01-11 DIAGNOSIS — I10 Essential (primary) hypertension: Secondary | ICD-10-CM | POA: Diagnosis not present

## 2015-01-11 LAB — CBC WITH DIFFERENTIAL/PLATELET
Basophil #: 0.1 10*3/uL (ref 0.0–0.1)
Basophil %: 0.5 %
Eosinophil #: 0 10*3/uL (ref 0.0–0.7)
Eosinophil %: 0.4 %
HCT: 40.6 % (ref 35.0–47.0)
HGB: 13.1 g/dL (ref 12.0–16.0)
Lymphocyte #: 0.9 10*3/uL — ABNORMAL LOW (ref 1.0–3.6)
Lymphocyte %: 8.3 %
MCH: 32.1 pg (ref 26.0–34.0)
MCHC: 32.4 g/dL (ref 32.0–36.0)
MCV: 99 fL (ref 80–100)
Monocyte #: 0.7 x10 3/mm (ref 0.2–0.9)
Monocyte %: 6.2 %
Neutrophil #: 9.2 10*3/uL — ABNORMAL HIGH (ref 1.4–6.5)
Neutrophil %: 84.6 %
Platelet: 219 10*3/uL (ref 150–440)
RBC: 4.1 10*6/uL (ref 3.80–5.20)
RDW: 14.3 % (ref 11.5–14.5)
WBC: 10.9 10*3/uL (ref 3.6–11.0)

## 2015-01-11 LAB — COMPREHENSIVE METABOLIC PANEL
Albumin: 3.2 g/dL — ABNORMAL LOW (ref 3.4–5.0)
Alkaline Phosphatase: 49 U/L
Anion Gap: 7 (ref 7–16)
BUN: 24 mg/dL — ABNORMAL HIGH (ref 7–18)
Bilirubin,Total: 0.4 mg/dL (ref 0.2–1.0)
Calcium, Total: 8.4 mg/dL — ABNORMAL LOW (ref 8.5–10.1)
Chloride: 103 mmol/L (ref 98–107)
Co2: 26 mmol/L (ref 21–32)
Creatinine: 1.68 mg/dL — ABNORMAL HIGH (ref 0.60–1.30)
EGFR (African American): 38 — ABNORMAL LOW
EGFR (Non-African Amer.): 31 — ABNORMAL LOW
Glucose: 194 mg/dL — ABNORMAL HIGH (ref 65–99)
Osmolality: 281 (ref 275–301)
Potassium: 4.8 mmol/L (ref 3.5–5.1)
SGOT(AST): 20 U/L (ref 15–37)
SGPT (ALT): 28 U/L
Sodium: 136 mmol/L (ref 136–145)
Total Protein: 6.5 g/dL (ref 6.4–8.2)

## 2015-01-11 LAB — HEMOGLOBIN A1C: Hemoglobin A1C: 6.3 % (ref 4.2–6.3)

## 2015-01-13 ENCOUNTER — Encounter: Payer: Self-pay | Admitting: Internal Medicine

## 2015-01-13 ENCOUNTER — Ambulatory Visit (INDEPENDENT_AMBULATORY_CARE_PROVIDER_SITE_OTHER): Payer: Medicare Other | Admitting: Internal Medicine

## 2015-01-13 VITALS — BP 114/64 | HR 62 | Temp 97.4°F | Resp 14 | Ht 64.0 in | Wt 164.0 lb

## 2015-01-13 DIAGNOSIS — N179 Acute kidney failure, unspecified: Secondary | ICD-10-CM | POA: Diagnosis not present

## 2015-01-13 DIAGNOSIS — E273 Drug-induced adrenocortical insufficiency: Secondary | ICD-10-CM | POA: Diagnosis not present

## 2015-01-13 DIAGNOSIS — T380X5A Adverse effect of glucocorticoids and synthetic analogues, initial encounter: Secondary | ICD-10-CM

## 2015-01-13 DIAGNOSIS — E785 Hyperlipidemia, unspecified: Secondary | ICD-10-CM

## 2015-01-13 LAB — BASIC METABOLIC PANEL
BUN: 17 mg/dL (ref 6–23)
CO2: 26 mEq/L (ref 19–32)
Calcium: 9.4 mg/dL (ref 8.4–10.5)
Chloride: 102 mEq/L (ref 96–112)
Creatinine, Ser: 1.06 mg/dL (ref 0.40–1.20)
GFR: 53.43 mL/min — ABNORMAL LOW (ref >60.00)
Glucose, Bld: 95 mg/dL (ref 70–99)
Potassium: 4.9 mEq/L (ref 3.5–5.1)
Sodium: 135 mEq/L (ref 135–145)

## 2015-01-13 MED ORDER — PREDNISONE 5 MG PO TABS: ORAL_TABLET | ORAL | Status: DC

## 2015-01-13 NOTE — Progress Notes (Signed)
Patient ID: [name] [name], female   DOB: 10/23/1936, 79 y.o.   MRN: [id number]   Patient Active Problem List   Diagnosis Date Noted  . 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 Vitamin D deficiency 12/27/2014  . S/P TAH-BSO 12/13/2014  . S/P bilateral mastectomy 12/13/2014  . Long term current use of anticoagulant therapy 09/25/2014  . HH (hiatus hernia) 04/20/2014  . S/P hysterectomy 11/15/2013  . Tachycardia induced cardiomyopathy 07/20/2013  . Unspecified vitamin D deficiency 04/24/2013  . Family history of breast cancer in female 04/23/2013  . Medicare annual wellness visit, subsequent 04/23/2013  . Preop cardiovascular exam 04/09/2013  . Obesity 06/30/2012  . Fatigue 06/22/2012  . History of [place] spotted fever 06/22/2012  . Persistent atrial fibrillation 05/09/2012  . Anxiety and depression 05/09/2012  . TACHYCARDIA 02/01/2011  . Hyperlipidemia 07/08/2010  . Essential hypertension 03/05/2009  . HEADACHE, CHRONIC 03/05/2009  . DYSPNEA 03/05/2009    Subjective:  CC:   Chief Complaint  Patient presents with  . Follow-up    ER Dizzines     HPI:   [name] [name] is a 79 y.o. female who presents for  recurrent dizziness,  Bps running low , nausea and weakness.Pateint was  Seen in Er on Jan 23 rd for same symptoms and and concern for concussion given recent bump to head on Wedensday prior.     History: was prescribed a 2 weeks predniosne taper starting Jan 8th by orthopedist ,  Last dose was  20 mg   And was several days before ER visit on Jan 23,  Prednisone was prescribed by [facility]  For persistent right knee pain secondary to  nondisplaced tibial plateau fracture that  Was confirmed with CT scan after persistent pain did not respond to cortisone injection .  The prednisone started to make her feel better, but then developed dizziness and low energy and weakness after stopping it.    Then hit her head on the car trunk lid,   So went to ER and CT scan was fine. Was having urinary frequency as well..  ER labs notable for Cr 1.68,  serum glucose 194,    A1c was 6.3 (ULN) .  Was given IV fluids to correct acute renal failure .    Today feels nervous and jittery and weak, nausea without emesis.  Urinary frequency is improved bt still present.   2) Follow up on Separate issue:  Wants to start statin therapy for elevated lipids despite trial of red yeast rice.   Past Medical History  Diagnosis Date  . Atrial fibrillation     Paroxysmal, hx of  . Cardiomyopathy   . Dyspnea   . Headache(784.0)     chronic  . Hypertension   . Pre-syncope   . Chronic kidney disease     acute renal failure secondary to dehydration which is now resolved  . Light headedness     due to dehydration  . Atrial flutter 02/2011    s/p cardioversion   . Hyperlipidemia     Past Surgical History  Procedure Laterality Date  . Cardioversion      x 3  . Cholecystectomy    . Appendectomy    . Multiple orthopedic procedures    . Mastectomy  1986    Bilateral with silicone  breast implants, s/p saline replacements  . Cardiac catheterization    . Combined augmentation mammaplasty and abdominoplasty    . Abdominal hysterectomy  1990  .  Total knee arthroplasty Left   . Cardioversion         The following portions of the patient's history were reviewed and updated as appropriate: Allergies, current medications, and problem list.    Review of Systems:   Patient denies headache, fevers, malaise, unintentional weight loss, skin rash, eye pain, sinus congestion and sinus pain, sore throat, dysphagia,  hemoptysis , cough, dyspnea, wheezing, chest pain, palpitations, orthopnea, edema, abdominal pain, nausea, melena, diarrhea, constipation, flank pain, dysuria, hematuria, urinary  Frequency, nocturia, numbness, tingling, seizures,  Focal weakness, Loss of consciousness,  Tremor, insomnia, depression, anxiety, and suicidal ideation.      History   Social History  . Marital Status: Married    Spouse Name: N/A    Number of Children: 1  . Years of Education: N/A   Occupational History  .     Social History Main Topics  . Smoking status: Never Smoker   . Smokeless tobacco: Never Used  . Alcohol Use: No  . Drug Use: No  . Sexual Activity: No   Other Topics Concern  . Not on file   Social History Narrative    Objective:  Filed Vitals:   01/13/15 1124  BP: 114/64  Pulse: 62  Temp: 97.4 F (36.3 C)  Resp: 14     General appearance: alert, cooperative and appears stated age Ears: normal TM's and external ear canals both ears Throat: lips, mucosa, and tongue normal; teeth and gums normal Neck: no adenopathy, no carotid bruit, supple, symmetrical, trachea midline and thyroid not enlarged, symmetric, no tenderness/mass/nodules Back: symmetric, no curvature. ROM normal. No CVA tenderness. Lungs: clear to auscultation bilaterally Heart: regular rate and rhythm, S1, S2 normal, no murmur, click, rub or gallop Abdomen: soft, non-tender; bowel sounds normal; no masses,  no organomegaly Pulses: 2+ and symmetric Skin: Skin color, texture, turgor normal. No rashes or lesions Lymph nodes: Cervical, supraclavicular, and axillary nodes normal.  Assessment and Plan:  Problem List Items Addressed This Visit    Acute renal failure    Secondary to dehydration, Per review of ER labs and notes.Resolved by repeat assessment today.    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  It appears that patient was tapered to 20 mg and then stopped,  Will resume 10 mg daily for 3 days,   Then 5 mg daily for 3 days, then 5 mg every other day for 4 DAYS THEN STOP       Hyperlipidemia    Not at goal with trial of red yeast rice .  Trial of pravastatin once she is feeling back to normal.        A total of 40 minutes was spent with patient more than half of which was spent in counseling patient on the above mentioned issues , reviewing and explaining recent labs and imaging studies done at the [facility] ER visit , and coordination of care.

## 2015-01-13 NOTE — Progress Notes (Signed)
Pre-visit discussion using our clinic review tool. No additional management support is needed unless otherwise documented below in the visit note.  

## 2015-01-13 NOTE — Assessment & Plan Note (Addendum)
Suggested by relative  hypotension, orthostasis , nausea and weakness.  It appears that patient was tapered to 20 mg and then stopped,  Will resume 10 mg daily for 3 days,  Then 5 mg daily for 3 days, then 5 mg every other day for 4 DAYS THEN STOP

## 2015-01-13 NOTE — Patient Instructions (Addendum)
Your symptoms may be due to adrenal insufficiency caused by the steroids you were taking   I  am resuming a lower dose to taper over the next 10 days as follows:   2 Tablets once daily (total of 10 mg ) for  3 days,  Then 1 tablet daily (5 mg ) x 3 days,  then 1/2  tablet daily (2.5 mg )   for 4 days,  Then stop  We will start a cholesterol medication once you are feeling better .  Just let me know when you are feeling better

## 2015-01-14 DIAGNOSIS — N179 Acute kidney failure, unspecified: Secondary | ICD-10-CM | POA: Insufficient documentation

## 2015-01-14 NOTE — Assessment & Plan Note (Addendum)
Secondary to dehydration, Per review of ER labs and notes.Resolved by repeat assessment today.    Lab Results  Component Value Date   CREATININE 1.06 01/13/2015   Lab Results  Component Value Date   NA 135 01/13/2015   K 4.9 01/13/2015   CL 102 01/13/2015   CO2 26 01/13/2015

## 2015-01-14 NOTE — Assessment & Plan Note (Signed)
Not at goal with trial of red yeast rice .  Trial of pravastatin once she is feeling back to normal.

## 2015-01-20 ENCOUNTER — Ambulatory Visit: Payer: Medicare Other | Admitting: Cardiovascular Disease

## 2015-01-28 ENCOUNTER — Telehealth: Payer: Self-pay | Admitting: Internal Medicine

## 2015-01-28 ENCOUNTER — Telehealth: Payer: Self-pay

## 2015-01-28 DIAGNOSIS — E785 Hyperlipidemia, unspecified: Secondary | ICD-10-CM

## 2015-01-28 MED ORDER — COENZYME Q10 30 MG PO CAPS: 30.0000 mg | ORAL_CAPSULE | Freq: Every day | ORAL | Status: DC

## 2015-01-28 MED ORDER — PRAVASTATIN SODIUM 20 MG PO TABS: 20.0000 mg | ORAL_TABLET | Freq: Every day | ORAL | Status: DC

## 2015-01-28 NOTE — Telephone Encounter (Signed)
Pravastatin and Co Q 10 sent to pharmacy,  Take at bedtime. The co q 10 is reported by many to help improve toleration of the statin.

## 2015-01-28 NOTE — Telephone Encounter (Signed)
Patient notified and voiced understanding.

## 2015-01-28 NOTE — Telephone Encounter (Signed)
Patient called about starting cholesterol medication, patient stated she is over her symptoms and ask if you would like her to start medication that during visit you mentioned you would order pravastatin. Please advise?

## 2015-01-28 NOTE — Assessment & Plan Note (Signed)
Resuming trial of pravastatin along with Co Q 10

## 2015-01-28 NOTE — Telephone Encounter (Signed)
Samples given of eliquis 5 mg (5 boxes given).

## 2015-01-28 NOTE — Telephone Encounter (Signed)
Pt would like Eliquis 5 mg samples

## 2015-02-03 ENCOUNTER — Telehealth: Payer: Self-pay | Admitting: *Deleted

## 2015-02-03 NOTE — Telephone Encounter (Signed)
02/01/15:141/65 hr 63  02/02/15: Day 155/73 then went down to 102/44  2/14/16t Night 136/67 hr 59  02/03/15: 8 am 132/72 and 66  02/03/15: 1030am 98/46 and hr 63  Patient stated she was on Prednisone for a while after her knee injury  It was stopped without tapering  Dr. Derrel Nip restarted her on a smaller dose  Patient stated her blood pressure has been up and down so her medications have been adjusted several times   Patient stated she is worried all of the medication  She would like to be seen in the office by Dr. Fletcher Anon  Patient scheduled to see Dr. Fletcher Anon 2/18

## 2015-02-03 NOTE — Telephone Encounter (Signed)
Pt is calling stating she is haiving problems with blood pressue.   Pt c/o BP issue: STAT if pt c/o blurred vision, one-sided weakness or slurred speech  1. What are your last 5 BP readings?   02/01/15:141/65 hr 63  Yesterday 155/73 down 102/44  Last night 136/67 hr 59  This morning around 8 am 132/72 and 66  now 98/46 and hr 63   2. Are you having any other symptoms (ex. Dizziness, headache, blurred vision, passed out)?  no 3. What is your BP issue?  When she came on prozone on the 9th. Her bp is going back and forth. From high to low. Dr Kalman Jewels changed her meds.   Made an apt to see dr Fletcher Anon for Thursday. Wanted to come see him but needs advise now.. Did not want to do this over phone. But does not want wait, please advise. She stated it may be a medication change problem.

## 2015-02-06 ENCOUNTER — Encounter: Payer: Self-pay | Admitting: Cardiovascular Disease

## 2015-02-06 ENCOUNTER — Encounter (INDEPENDENT_AMBULATORY_CARE_PROVIDER_SITE_OTHER): Payer: Self-pay

## 2015-02-06 ENCOUNTER — Ambulatory Visit (INDEPENDENT_AMBULATORY_CARE_PROVIDER_SITE_OTHER): Payer: Medicare Other | Admitting: Cardiovascular Disease

## 2015-02-06 VITALS — BP 120/64 | HR 59 | Ht 64.5 in | Wt 167.8 lb

## 2015-02-06 DIAGNOSIS — I481 Persistent atrial fibrillation: Secondary | ICD-10-CM

## 2015-02-06 DIAGNOSIS — I4819 Other persistent atrial fibrillation: Secondary | ICD-10-CM

## 2015-02-06 DIAGNOSIS — I10 Essential (primary) hypertension: Secondary | ICD-10-CM | POA: Diagnosis not present

## 2015-02-06 NOTE — Assessment & Plan Note (Signed)
Blood pressure is well controlled on current medications. Recent fluctuations in blood pressure was likely due to treatment with steroids.

## 2015-02-06 NOTE — Assessment & Plan Note (Signed)
She is maintaining in sinus rhythm on amiodarone. She is tolerating anticoagulation with Eliquis with no reported side effects. Recent labs were unremarkable including CBC and basic metabolic profile.

## 2015-02-06 NOTE — Progress Notes (Signed)
HPI  [name] [name] is a 79 year old female who is here today for a followup visit.  She has known history of persistent atrial fibrillation/flutter status post multiple cardioversions in the past. 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 She had cardiac catheterization twice in the past without significant coronary artery disease.  Most recent cardioversion was in August of 2014. She did have bradycardia post cardioversion. She has known history of labile hypertension. Most recent Echocardiogram in September 2015 showed normal LV systolic function with moderately dilated left atrium, mild mitral regurgitation, moderate tricuspid regurgitation and moderate to severe pulmonary hypertension.  He fell recently and has a right knee fracture. She was prescribed prednisone which causes significant fluctuation in her blood pressure and blood sugar. She is off steroids now and feels close to her baseline. Blood pressure has improved over the last 2 days. Otherwise she is stable from a cardiac standpoint any chest pain, shortness of breath or palpitations.  Allergies  Allergen Reactions  . Biaxin [Clarithromycin]   . Codeine   . Iodine   . Promethazine Other (See Comments)  . Warfarin Sodium      Current Outpatient Prescriptions on File Prior to Visit  Medication Sig Dispense Refill  . amiodarone (PACERONE) 200 MG tablet Take 1 tablet (200 mg total) by mouth daily. 30 tablet 6  . amLODipine (NORVASC) 5 MG tablet Take 5 mg by mouth daily.    [name] apixaban (ELIQUIS) 5 MG TABS tablet Take 1 tablet (5 mg total) by mouth 2 (two) times daily. 60 tablet 6  . co-enzyme Q-10 30 MG capsule Take 1 capsule (30 mg total) by mouth daily. 90 capsule 3  . ergocalciferol (DRISDOL) 50000 UNITS capsule Take 1 capsule (50,000 Units total) by mouth once a week. 12 capsule 0  . HYDROcodone-acetaminophen (NORCO/VICODIN) 5-325 MG per tablet Take 1 tablet by mouth every 6 (six) hours as  needed for moderate pain. 90 tablet 0  . lisinopril (PRINIVIL,ZESTRIL) 20 MG tablet Take 1 tablet (20 mg total) by mouth daily. 90 tablet 3  . omeprazole (PRILOSEC) 20 MG capsule TAKE ONE (1) CAPSULE BY MOUTH 2 TIMES DAILY 180 capsule 1  . ondansetron (ZOFRAN-ODT) 8 MG disintegrating tablet Take 1 tablet (8 mg total) by mouth every 8 (eight) hours as needed for nausea or vomiting. 20 tablet 0  . pravastatin (PRAVACHOL) 20 MG tablet Take 1 tablet (20 mg total) by mouth daily. 90 tablet 0  . traMADol (ULTRAM) 50 MG tablet Take 1 tablet (50 mg total) by mouth every 6 (six) hours as needed. For pain 90 tablet 3  . furosemide (LASIX) 20 MG tablet TAKE ONE (1) TABLET EACH DAY (Patient not taking: Reported on 02/06/2015) 30 tablet 5   No current facility-administered medications on file prior to visit.     Past Medical History  Diagnosis Date  . Atrial fibrillation     Paroxysmal, hx of  . Cardiomyopathy   . Dyspnea   . Headache(784.0)     chronic  . Hypertension   . Pre-syncope   . Chronic kidney disease     acute renal failure secondary to dehydration which is now resolved  . Light headedness     due to dehydration  . Atrial flutter 02/2011    s/p cardioversion   . Hyperlipidemia   . Knee fracture      Past Surgical History  Procedure Laterality Date  . Cardioversion      x 3  .  Cholecystectomy    . Appendectomy    . Multiple orthopedic procedures    . Mastectomy  1986    Bilateral with silicone  breast implants, s/p saline replacements  . Cardiac catheterization    . Combined augmentation mammaplasty and abdominoplasty    . Abdominal hysterectomy  1990  . Total knee arthroplasty Left   . Cardioversion       Family History  Problem Relation Age of Onset  . Heart disease Mother   . Cancer Father     stomach  . Cancer Sister     breast  . Cancer Maternal Grandmother     breast  . Cancer Sister     breast  . Diabetes Neg Hx   . Heart disease Son     found at  autopsy     History   Social History  . Marital Status: Married    Spouse Name: N/A  . Number of Children: 1  . Years of Education: N/A   Occupational History  .     Social History Main Topics  . Smoking status: Never Smoker   . Smokeless tobacco: Never Used  . Alcohol Use: No  . Drug Use: No  . Sexual Activity: No   Other Topics Concern  . Not on file   Social History Narrative     PHYSICAL EXAM   BP 120/64 mmHg  Pulse 59  Ht 5' 4.5" (1.638 m)  Wt 167 lb 12 oz (76.091 kg)  BMI 28.36 kg/m2 Constitutional: She is oriented to person, place, and time. She appears well-developed and well-nourished. No distress.  HENT: No nasal discharge.  Head: Normocephalic and atraumatic.  Eyes: Pupils are equal and round. Right eye exhibits no discharge. Left eye exhibits no discharge.  Neck: Normal range of motion. Neck supple. No JVD present. No thyromegaly present.  Cardiovascular: Normal rate, irregular rhythm, normal heart sounds. Exam reveals no gallop and no friction rub. No murmur heard.  Pulmonary/Chest: Effort normal and breath sounds normal. No stridor. No respiratory distress. She has no wheezes. She has no rales. She exhibits no tenderness.  Abdominal: Soft. Bowel sounds are normal. She exhibits no distension. There is no tenderness. There is no rebound and no guarding.  Musculoskeletal: Normal range of motion. She exhibits trace edema and no tenderness.  Neurological: She is alert and oriented to person, place, and time. Coordination normal.  Skin: Skin is warm and dry. No rash noted. She is not diaphoretic. No erythema. No pallor.  Psychiatric: She has a normal mood and affect. Her behavior is normal. Judgment and thought content normal.     EKG: Sinus  Bradycardia  -Right bundle branch block.   ABNORMAL    ASSESSMENT AND PLAN

## 2015-02-06 NOTE — Patient Instructions (Signed)
Continue same medications.   Your physician wants you to follow-up in: 6 months.  You will receive a reminder letter in the mail two months in advance. If you don't receive a letter, please call our office to schedule the follow-up appointment.  

## 2015-02-10 ENCOUNTER — Ambulatory Visit: Payer: Medicare Other | Admitting: Cardiovascular Disease

## 2015-02-13 DIAGNOSIS — M25561 Pain in right knee: Secondary | ICD-10-CM | POA: Diagnosis not present

## 2015-02-13 DIAGNOSIS — M1711 Unilateral primary osteoarthritis, right knee: Secondary | ICD-10-CM | POA: Diagnosis not present

## 2015-02-21 DIAGNOSIS — M1711 Unilateral primary osteoarthritis, right knee: Secondary | ICD-10-CM | POA: Diagnosis not present

## 2015-02-21 DIAGNOSIS — M179 Osteoarthritis of knee, unspecified: Secondary | ICD-10-CM | POA: Insufficient documentation

## 2015-02-21 DIAGNOSIS — M1710 Unilateral primary osteoarthritis, unspecified knee: Secondary | ICD-10-CM | POA: Insufficient documentation

## 2015-02-24 ENCOUNTER — Ambulatory Visit: Payer: Medicare Other | Admitting: Cardiovascular Disease

## 2015-02-25 ENCOUNTER — Telehealth: Payer: Self-pay | Admitting: *Deleted

## 2015-02-25 DIAGNOSIS — E782 Mixed hyperlipidemia: Secondary | ICD-10-CM

## 2015-02-25 DIAGNOSIS — Z79899 Other long term (current) drug therapy: Secondary | ICD-10-CM

## 2015-02-25 NOTE — Telephone Encounter (Signed)
Pt started on Pravastatin 4 weeks ago, she wants to know when she needs to have labs rechecked?

## 2015-02-25 NOTE — Telephone Encounter (Signed)
6 WEEKS FROM START OF MEDICATION,  NO SOONER.

## 2015-02-25 NOTE — Telephone Encounter (Signed)
Left message, notifying pt to call and schedule fasting lab appt

## 2015-02-28 DIAGNOSIS — M1711 Unilateral primary osteoarthritis, right knee: Secondary | ICD-10-CM | POA: Diagnosis not present

## 2015-03-07 DIAGNOSIS — M1711 Unilateral primary osteoarthritis, right knee: Secondary | ICD-10-CM | POA: Diagnosis not present

## 2015-03-12 ENCOUNTER — Other Ambulatory Visit (INDEPENDENT_AMBULATORY_CARE_PROVIDER_SITE_OTHER): Payer: Medicare Other

## 2015-03-12 DIAGNOSIS — E782 Mixed hyperlipidemia: Secondary | ICD-10-CM | POA: Diagnosis not present

## 2015-03-12 DIAGNOSIS — Z79899 Other long term (current) drug therapy: Secondary | ICD-10-CM | POA: Diagnosis not present

## 2015-03-12 LAB — LIPID PANEL
Cholesterol: 168 mg/dL (ref 0–200)
HDL: 64.2 mg/dL (ref >39.00)
LDL Cholesterol: 89 mg/dL (ref 0–99)
NonHDL: 103.8
Total CHOL/HDL Ratio: 3
Triglycerides: 75 mg/dL (ref 0.0–149.0)
VLDL: 15 mg/dL (ref 0.0–40.0)

## 2015-03-12 LAB — COMPREHENSIVE METABOLIC PANEL
ALT: 14 U/L (ref 0–35)
AST: 17 U/L (ref 0–37)
Albumin: 4 g/dL (ref 3.5–5.2)
Alkaline Phosphatase: 56 U/L (ref 39–117)
BUN: 20 mg/dL (ref 6–23)
CO2: 29 mEq/L (ref 19–32)
Calcium: 9.2 mg/dL (ref 8.4–10.5)
Chloride: 104 mEq/L (ref 96–112)
Creatinine, Ser: 1.12 mg/dL (ref 0.40–1.20)
GFR: 50.12 mL/min — ABNORMAL LOW (ref >60.00)
Glucose, Bld: 93 mg/dL (ref 70–99)
Potassium: 4.5 mEq/L (ref 3.5–5.1)
Sodium: 139 mEq/L (ref 135–145)
Total Bilirubin: 0.8 mg/dL (ref 0.2–1.2)
Total Protein: 6.7 g/dL (ref 6.0–8.3)

## 2015-03-17 ENCOUNTER — Encounter: Payer: Self-pay | Admitting: *Deleted

## 2015-03-31 ENCOUNTER — Telehealth: Payer: Self-pay

## 2015-03-31 DIAGNOSIS — Z08 Encounter for follow-up examination after completed treatment for malignant neoplasm: Secondary | ICD-10-CM | POA: Diagnosis not present

## 2015-03-31 DIAGNOSIS — L570 Actinic keratosis: Secondary | ICD-10-CM | POA: Diagnosis not present

## 2015-03-31 DIAGNOSIS — Z1283 Encounter for screening for malignant neoplasm of skin: Secondary | ICD-10-CM | POA: Diagnosis not present

## 2015-03-31 DIAGNOSIS — L650 Telogen effluvium: Secondary | ICD-10-CM | POA: Diagnosis not present

## 2015-03-31 DIAGNOSIS — I788 Other diseases of capillaries: Secondary | ICD-10-CM | POA: Diagnosis not present

## 2015-03-31 DIAGNOSIS — Z85828 Personal history of other malignant neoplasm of skin: Secondary | ICD-10-CM | POA: Diagnosis not present

## 2015-03-31 NOTE — Telephone Encounter (Signed)
From lab note from 12/27/14 patient after 3 months of megadose Vit d is to start 1000 units daily Vit D 3, patient has been notified,

## 2015-03-31 NOTE — Telephone Encounter (Signed)
The patient called and is hoping to get information regarding her vitamin D.  She is unsure of the dose she should be taking.

## 2015-04-04 ENCOUNTER — Telehealth: Payer: Self-pay | Admitting: *Deleted

## 2015-04-04 NOTE — Telephone Encounter (Signed)
Samples of Eloquis 5mg . Please call when ready for pick up.

## 2015-04-04 NOTE — Telephone Encounter (Signed)
Samples available at front desk for Eliquis 5 mg.
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
## 2015-04-11 NOTE — Op Note (Signed)
PATIENT NAME:  [name] [name], [name] [name] MR#:  [id number] DATE OF BIRTH:  Feb 28, 1936  DATE OF PROCEDURE: 06/04/2013.   PREOPERATIVE DIAGNOSIS: Degenerative arthrosis of the left knee.   POSTOPERATIVE DIAGNOSIS: Degenerative arthrosis of the left knee.   PROCEDURE PERFORMED: Left total knee arthroplasty using computer-assisted navigation.   SURGEON: Dr. Skip Estimable.   ASSISTANT: Vance Peper, PA.   ANESTHESIA: Femoral nerve block, and spinal.   ESTIMATED BLOOD LOSS: 250 mL.   FLUIDS REPLACED: 1700 mL of crystalloid.   TOURNIQUET TIME: 96 minutes.   DRAINS: Two medium drains through reinfusion system.   SOFT TISSUE RELEASES: Anterior cruciate ligament, posterior cruciate ligament, deep medial collateral ligament, patellofemoral ligament.   IMPLANTS UTILIZED: DePuy PFC Sigma, size 3 posterior-stabilized femoral component (cemented), size 2.5 MBT tibial component (cemented), 32 mm 3-pegged oval-domed patella (cemented), and a 10 mm stabilized rotating platform polyethylene insert.   Gentamicin cement was utilized during the procedure due to the patient's positive MRSA nasal swab.   INDICATIONS FOR SURGERY: The patient is a 79 year old female who has been seen for complaints of progressive left knee pain. X-rays demonstrated severe degenerative changes in tricompartmental fashion, with relative valgus deformity. After a discussion of the risks and benefits of surgical intervention, the patient expressed understanding of the risks and benefits and agreed with plans for surgical intervention.   PROCEDURE IN DETAIL: The patient was brought to the operating room, and after adequate femoral nerve block and spinal anesthesia was achieved, a tourniquet was placed on the patient's upper left thigh. The patient's left knee and leg were cleaned and prepped with alcohol and DuraPrep, draped in the usual sterile fashion. A "time-out" was performed as per usual protocol. The left lower extremity was exsanguinated  using an Esmarch, and the tourniquet was inflated to 300 mmHg. An anterior longitudinal incision was made followed by a standard mid-vastus approach. A large effusion was evacuated. The deep fibers of the medial collateral ligament were elevated in a subperiosteal fashion off the medial flare of the tibia so as to maintain a continuous soft tissue sleeve. The patella was subluxed laterally and the patellofemoral ligament was incised. Inspection of the knee demonstrated severe degenerative changes, with bone-on-bone articulation noted laterally. Prominent osteophytes were debrided using a rongeur. Anterior and posterior cruciate ligaments were excised. Two 4.0 mm Schanz pins were inserted into the femur and into the tibia for attachment of the array of trackers used for computer-assisted navigation. Hip center was identified using circumduction technique. Distal landmarks were mapped using the computer. The distal femur and proximal tibia were mapped using the computer. Distal femoral cutting guide was positioned using computer-assisted navigation so as to achieve a  5 degree distal valgus cut. Cut was performed and verified using the computer. The distal femur was sized, and it was felt that a size 3 femoral component was appropriate. Size 3 cutting guide was positioned and the anterior cut was performed and verified using the computer. This was followed by completion of the posterior and chamfer cuts. Femoral cutting guide for the central box was then positioned and the central box cut was performed. Attention was then directed to the proximal tibia. Medial and lateral menisci were excised. The extramedullary tibial cutting guide was positioned using computer-assisted navigation so as to achieve 0 degree varus-valgus alignment and a 0 degree posterior slope. A cut was performed and verified using the computer. The proximal tibia was sized and it was felt that a 2.5 tibial tray was appropriate. Tibial and femoral  trials were inserted, followed by insertion of a 10 mm polyethylene insert. The knee was felt to be tight both in flexion and in extension. The trial components were removed. The extramedullary tibial cutting guide was positioned so as to resect an additional 2 mm of bone. Cut was performed and verified using the computer. Trial components were reinserted, with excellent mediolateral soft tissue balancing both in full extension and in flexion. Finally, the patella was cut and prepared so as to accommodate a 32 mm 3 pegged oval dome patella. Patellar trial was placed and the knee was placed through a range of motion, with excellent patellar tracking appreciated. The femoral trial was removed after debridement of posterior osteophytes. Central post hole for the tibial component was reamed, followed by insertion of a keel punch. Tibial trials were then removed. The cut surfaces of bone were irrigated with copious amounts of normal saline with antibiotic solution using pulsatile lavage and then suctioned dry. Polymethyl methacrylate cement with gentamicin was prepared in the usual fashion using a vacuum mixer. Cement was applied to the cut surface of the proximal tibia as well as along the undersurface of a size 2.5 MBT tibial component. The tibial component was positioned and impacted into place. Excess cement was removed using [employer] fast streamer. Cement was then applied to the cut surface of the femur as well as along the posterior flanges of a size 3 posterior-stabilized femoral component. Femoral component was positioned and impacted into place. Excess cement was removed using [employer] fast streamer. A 10 mm polyethylene trial was inserted and the knee was brought in full extension with steady axial compression applied. Finally, cement was applied to the backside of a 32 mm, 3-peg, oval dome patella and the patellar component was positioned and a patellar clamp applied. Excess cement was removed using [employer] fast streamer.    After adequate curing of cement, the tourniquet was deflated after a total tourniquet time of  96 minutes. Hemostasis was achieved using electrocautery. The knee was irrigated with copious amounts of normal saline with antibiotic solution using pulsatile lavage and then suctioned dry. The knee was inspected for any residual cement debris. Twenty mL of Exparel mixed with  40 mL of injectable normal saline was then injected along the posterior capsule, medial and lateral gutters, as well as along the arthrotomy site. A 10 mm stabilized rotating platform polyethylene insert was inserted and the knee was placed through a range of motion, with excellent patellar tracking appreciated and excellent medial and lateral soft tissue balancing noted. Two medium drains were placed in the wound bed and brought out through a separate stab incision to be attached to a reinfusion system. The medial parapatellar portion of the incision was reapproximated using interrupted sutures of #1 Vicryl. The subcutaneous tissue was approximated in layers using, first, #0 Vicryl followed by #2-0 Vicryl. Skin was closed with skin staples. A sterile dressing was applied.   The patient tolerated the procedure well. She was transported to the recovery room in stable condition.    ____________________________ Laurice Record. Holley Bouche., MD jph:dm D: 06/04/2013 23:40:12 ET T: 06/05/2013 09:04:05 ET JOB#: [id number]  cc: Jeneen Rinks P. Holley Bouche., MD, <Dictator> JAMES P Holley Bouche MD ELECTRONICALLY SIGNED 06/06/2013 11:30

## 2015-04-12 NOTE — Consult Note (Signed)
PATIENT NAME:  [name] [name], [name] [name] MR#:  [id number] DATE OF BIRTH:  03-07-1936  DATE OF CONSULTATION:  09/14/2014  CONSULTING PHYSICIAN:  Manya Silvas, MD.  HISTORY OF PRESENT ILLNESS: The patient is a 79 year old, white female who has had significant problems with headaches in the last few months. She also has a history of hypertension. It is at times very elevated and considered malignant hypertension. She also has a history of atrial arrhythmias with fibrillation and flutter. She has been cardioverted 4 times. She also is on Xarelto because of her atrial arrhythmias. She had severe headache Thursday night. The headache started with pain in her head and her neck and then Friday, she felt bad all day with headache and nausea and had vomiting 3 times with "yellow bile and fresh blood in it." She describes the total amount of blood as a handful of bright red blood. She was seen in the ER last night and admitted to the hospital. I was asked to see her in consultation for upper GI bleeding. The patient has not had vomiting since she has been in her room. She has not had any vomiting now for 18 hours and her headache is feeling some better. She has been on tramadol and Tylenol.   She sees Dr. Fletcher Anon for her cardiac problems. She is concerned about the bleeding and wishes to get to the bottom of it. Therefore, we were going to do an upper endoscopy tomorrow with anesthesia help. She was not put back on Xarelto and we will hold that also. We will change her diet from current diet to clear liquid diet, so there will not be any possible irritation from the bleeding source. She says she had been on blood thinners for years because of her irregular atrial problems and that she was on Coumadin, but it was always very hard to regulate it and she had a retinal bleed while on Coumadin and that is why she is on Xarelto now. The patient has had problems off and on with abdominal pain for a couple of months. Her husband was  diagnosed with a recurrence of lymphoma. He had lymphoma diagnosed and treated 19 years ago and has recurrence now that he is seeing Dr. Oliva Bustard for. She has an occasional cough that she sometimes notices when she wakes up from sleep or if it wakes her up from sleep.   ALLERGIES: IODINE, CODEINE AND BIAXIN.   DIAGNOSTIC DATA: A CAT scan of the head showed no acute intracranial abnormality. The patient did have an upper endoscopy on 04/11/2014 with Dr. Candace Cruise because of the heme-positive stool and a small hiatal hernia was seen. She had a colonoscopy also and the colon appeared normal.   PHYSICAL EXAMINATION: GENERAL: Elderly, white female in no acute distress.  VITAL SIGNS: Temperature 98.4, pulse 64, respirations 18, blood pressure 137/63, pulse oximetry 96% on 3 liters.  HEENT: Sclerae anicteric. Conjunctivae negative. Tongue negative. Head is atraumatic.  CHEST: Inspiratory crackles bibasilar. No A to E changes. No egophony.  HEART: No murmurs or gallops I can hear.  ABDOMEN: Nontender. No hepatosplenomegaly. No masses. No bruits.  SKIN: Warm and dry.  PSYCHIATRIC: Mood and affect are appropriate.   LABORATORY DATA: Shows hemoglobin of 13.1 on admission and 12.6 today, platelet count 195,000, white count 5.8. Urinalysis essentially unremarkable. Glucose 102, BUN 18, creatinine 1.14, sodium 133, potassium 4.1, chloride 103, CO2 of 26, calcium 8.5. Troponin less than 0.02.   ASSESSMENT: Hematemesis with a headache and blood pressure  problems. Probably a Mallory-Weiss tear. Because of this, I will change her diet from currently prescribed to clear liquids. She is very much concerned about this bleeding and would like to get to the bottom of it. We will do an upper endoscopy tomorrow. I will be sure that she gets her blood pressure medicine in the morning and that it is not held, given her labile blood pressure. This will be done with anesthesia assistance. I am a little concerned about her bibasilar  crackles and we will get a PA and lateral of the chest tonight and repeat exam tomorrow. She is currently on oxygen, uncertain if she has any history of pulmonary disease.     ____________________________ Manya Silvas, MD rte:TT D: 09/14/2014 18:27:27 ET T: 09/14/2014 19:29:02 ET JOB#: [id number]  cc: Manya Silvas, MD, <Dictator> Aaron Mose. Hower, MD [place] Verdell Carmine, MD Manya Silvas MD ELECTRONICALLY SIGNED 09/27/2014 12:26

## 2015-04-12 NOTE — Discharge Summary (Signed)
PATIENT NAME:  [name] [name], [name] [name] MR#:  [id number] DATE OF BIRTH:  10/01/1936  DATE OF ADMISSION:  09/14/2014 DATE OF DISCHARGE:  09/16/2014  For a detailed note, please take a look at the history and physical done on admission by Dr. Valentino Nose.    DIAGNOSES AT DISCHARGE: Malignant hypertension, hematemesis not resolved, history of chronic atrial fibrillation, gastroesophageal reflux disease.   DISCHARGE DIET: The patient is being discharged on a low-sodium diet.   ACTIVITY: As tolerated.   FOLLOWUP: with Dr. Deborra Medina in the next 1-2 weeks.   DISCHARGE MEDICATIONS: Omeprazole 20 mg b.i.d., amiodarone 200 mg daily, Xarelto 20 mg daily, tramadol 50 mg as needed for pain, lisinopril 20 mg daily, and Norvasc 2.5 mg daily.   [place] COURSE: Manya Silvas, MD from gastroenterology.   PERTINENT STUDIES DONE DURING THE HOSPITAL COURSE: A CT scan of the head done without contrast on admission showing no acute intracranial process. A chest x-ray done on admission showing enlargement of the cardiac silhouette with mild pulmonary vascular congestion.   A 2-dimensional echocardiogram done showing ejection fraction of 55-60%, borderline LVH, moderately dilated left and right atrium, mild mitral valve regurgitation, moderate tricuspid regurgitation, mild aortic regurgitation.   Upper GI endoscopy done on 09/16/2014 showing no evidence of any acute bleeding with a normal esophagus, normal duodenum and normal stomach.   HOSPITAL COURSE: This is a 79 year old female with medical problems as mentioned above, who presented to the hospital with a headache and dizziness, and noted to have significantly elevated blood pressures in the 200s. She also had a few episodes of hematemesis prior to coming to the hospital.  1.  Malignant hypertension. The patient's blood pressures remained fairly stable in the hospital after she was admitted and started on back on her home medications and placed on  some as-needed hydralazine. The exact etiology of why her blood pressure went up was unclear, although possibly due to the fact that she had a headache and the pain from headache probably provoked her elevated blood pressures. The patient has been resumed back on her home antihypertensives and her blood pressures have remained stable for the past 48-72 hours.  2.  Headache. This was likely a mild migraine headache. It has been treated with some as-needed tramadol and has resolved. Her CT head on admission was negative.  3.  Hematemesis. The patient was on Xarelto, which was held. Although, while in the hospital, she had no further episodes of hematemesis and her hemoglobin remained stable. She was seen by gastroenterology and underwent an upper GI endoscopy, which showed no evidence of any acute bleeding.  4.  Chronic atrial fibrillation. The patient has remained rate controlled in the hospital on her amiodarone. She will continue that. Xarelto was resumed as she had no further hematemesis and her endoscopy was essentially benign.   The patient is a Full Code.   She was discharged home.   TIME SPENT ON DISCHARGE: 40 minutes    ____________________________ Belia Heman. Verdell Carmine, MD vjs:MT D: 09/17/2014 08:18:40 ET T: 09/17/2014 08:34:49 ET JOB#: [id number]  cc: Belia Heman. Verdell Carmine, MD, <Dictator> Deborra Medina, MD Henreitta Leber MD ELECTRONICALLY SIGNED 10/01/2014 14:58

## 2015-04-12 NOTE — H&P (Signed)
PATIENT NAME:  [name] [name], [name] [name] MR#:  [id number] DATE OF BIRTH:  October 09, 1936  DATE OF ADMISSION:  09/14/2014  REFERRING PHYSICIAN: Dr. Dahlia Client.   PRIMARY CARE PHYSICIAN: Dr. Derrel Nip.   CHIEF COMPLAINT: Headache.   HISTORY OF PRESENT ILLNESS: This is a 79 year old Caucasian female with past medical history of atrial fibrillation on Xarelto for anticoagulation, as well as hypertension, presenting with headache. She describes intermittent headache for 2 week total duration. She notes it was associated with elevated blood pressure. She took her blood pressure a few times at home with maximum readings of systolic blood pressure in the 190s. She describes her headache as frontal in location, however, radiating to the occipital and neck. She describes only as "pain." Intensity 10 out of 10. No alleviating factors. Worsened by bright lights. Once again this has been intermittent last 2 weeks. However, she states on the day of arrival to the hospital her headache was more consistent with associated nausea and vomiting. She describes her emesis as "slightly bloody". Her blood pressure improved in the Emergency Department. Currently 127/66. Her headache is also improved. Original intensity of 10 down to about 6, otherwise no further complaints.   REVIEW OF SYSTEMS:  CONSTITUTIONAL: Denies fever, fatigue, weakness.  EYES: Denies blurred vision, double vision, eye pain.  EARS, NOSE, THROAT: Denies tinnitus, ear pain or hearing loss.  RESPIRATORY: Denies cough, wheeze, shortness of breath.  CARDIOVASCULAR: Denies chest pain. Positive for edema.   GASTROINTESTINAL: Positive for nausea and vomiting as described above as well as a bout of hematemesis. Denies any diarrhea or abdominal pain. Denies any change in bowel habits.  Denies any melena or hematochezia.  GENITOURINARY: Denies dysuria, hematuria. ENDOCRINE: Denies polyuria or thyroid problems.  HEMATOLOGIC: Denies easy bruising or bleeding.  SKIN: Denies rash  or lesion. MUSCULOSKELETAL: Denies pain in the neck, back, shoulders, knees, hips or arthritic pain.  NEUROLOGIC: Denies paralysis or paresthesias.  PSYCHIATRIC: Denies anxiety or depressive symptoms.  The rest of the review of systems were performed by me is negative.   PAST MEDICAL HISTORY: Hypertension as well as atrial fibrillation on Xarelto therapy for anticoagulation.   SOCIAL HISTORY: Denies any alcohol, tobacco, or drug use. Fully independent for activities of daily living.   FAMILY HISTORY: Denies any known cardiovascular or pulmonary disorders.   ALLERGIES: BIAXIN, CODEINE, COUMADIN, DEMEROL, IV CONTRAST DYE, OXYCODONE AND PHENERGAN.   HOME MEDICATIONS: Tramadol 50 mg p.o. daily as needed for pain, lisinopril 20 mg p.o. at bedtime, amiodarone 200 mg p.o. q. daily, Xarelto 20 mg p.o. q. daily,  1 capsule p.o. b.i.d., Prilosec 20 mg p.o. b.i.d., vitamin B12 2500 mcg p.o. q. daily.   PHYSICAL EXAMINATION:  VITAL SIGNS: Temperature 98.2, heart rate 59, respirations 18, blood pressure on arrival 174/78, currently 127/66, saturating 98% on room air. Weight 78.3 kg, BMI 29.7 kg.  GENERAL: Well-nourished, well-developed, Caucasian female in no apparent distress. HEAD: Normocephalic, atraumatic.  EYES: Pupils are equal, round and reactive to light. Extraocular muscles intact. No scleral icterus.  MOUTH: Moist mucous membranes. Dentition intact. No abscess noted.  EARS, NOSE, THROAT: Clear without exudates. No external lesions.  NECK: Supple. No thyromegaly. No nodules. No JVD.  PULMONARY: Clear to auscultation bilaterally without wheezes, rales or rhonchi. No accessory muscles usage. Good respiratory effort.  CHEST: Nontender to palpation. CARDIOVASCULAR: S1, S2, bradycardic, however, no murmur, rubs or gallops. No edema. Pedal pulses 2+ bilaterally.   GASTROINTESTINAL: Soft, nontender, nondistended. No masses. Positive bowel sounds. No hepatosplenomegaly.  MUSCULOSKELETAL: No  swelling,  clubbing or edema. Range of motion full in all extremities.  NEUROLOGIC: Cranial nerves II-XII intact. No gross focal neurologic deficits. Sensation intact. Reflexes intact.  within normal limits.  Strength is 5/5 in all extremities including proximal and distal flexion and extension.  SKIN: No  lesions, rashes, or cyanosis. Skin warm, dry. Turgor intact.  PSYCHIATRIC: Mood and affect within normal limits. The patient is awake, alert, oriented x 3. Insight and judgment intact.   LABORATORY DATA: CT head performed reveals no acute intracranial process. Sodium 133, potassium 4.1, chloride 103, bicarbonate 26, BUN 18, creatinine 1.14, glucose 102, troponin less than 0.02. WBC 9.4, hemoglobin 13.1, platelets of 203,000.   ASSESSMENT AND PLAN: A 79 year old Caucasian female with history of atrial fibrillation, hypertension, presenting with headache and had elevated blood pressures at home.  1. Hypertensive urgency with headache. Headache is actually improved at this point. CT head within normal limits. Supportive care for headache. This is likely secondary to elevated blood pressure. We will add p.r.n. hydralazine to her home regiment of antihypertensive with 10 mg  IV every 4 hours as needed for systolic greater than 357, diastolic greater than 017. 2. Hematemesis. Initiate PPI therapy IV b.i.d. Trend CBCs q. 6 hours. Transfusion threshold hemoglobin less than 7. We will consult gastroenterology.  3. Atrial fibrillation, currently in sinus bradycardia. Hold anticoagulation for now given questionable hematemesis.  4. Mild hyponatremia. IV fluid hydration with normal saline. Follow sodium levels.  5. Venous thromboembolism prophylaxis with SCDs.   CODE STATUS: The patient is a full code.  TIME SPENT:  45 minutes.     ____________________________ Aaron Mose. Tejah Brekke, MD dkh:JT D: 09/14/2014 03:42:24 ET T: 09/14/2014 04:14:16 ET JOB#: [id number]  cc: Aaron Mose. Jarmar Rousseau, MD, <Dictator> Andi Mahaffy Woodfin Ganja  MD ELECTRONICALLY SIGNED 09/14/2014 21:09

## 2015-04-12 NOTE — Consult Note (Signed)
Concerned about rales on PE at both bases and hx of malig HTN possible strain on heart.  CXR showed bibasilar infiltrates.  Exam today shows rales again.  Case discussed with Hospitalist.  Will consult her cardiologist Dr. Sophronia Simas and order an echo cardiogram for LV function.   Hold EGD as long as she is not having further hematemesis until cardio pul status is better since in the absense of further bleeding this is now an elective procedure.  Electronic Signatures: Manya Silvas (MD)  (Signed on 27-Sep-15 10:19)  Authored  Last Updated: 27-Sep-15 10:19 by Manya Silvas (MD)

## 2015-04-12 NOTE — Consult Note (Signed)
Pt EGD shows no esophageal, gastric, or duodenal pathology. Likely a small spot of either MW tear or gastritis which bled partly due to Xarelto effect.  May go home tonight per discussion with Dr. Tor Netters.  Electronic Signatures: Manya Silvas (MD)  (Signed on 28-Sep-15 16:40)  Authored  Last Updated: 28-Sep-15 16:40 by Manya Silvas (MD)

## 2015-04-13 NOTE — Op Note (Signed)
PATIENT NAME:  [name] [name], [name] [name] MR#:  [id number] DATE OF BIRTH:  05-12-1936  DATE OF PROCEDURE:  05/02/2012  PREOPERATIVE DIAGNOSIS: Atypical atrial flutter.   POSTOPERATIVE DIAGNOSIS: Atypical atrial flutter.   PROCEDURE PERFORMED: Cardioversion.   ATTENDING: Dorthy Magnussen A. Fletcher Anon, MD   PROCEDURE DETAILS: The patient's baseline ECG showed atypical atrial flutter with variable AV block. Her physical examination was unremarkable. Time-out was performed. Anesthesia was performed with propofol which was administered by the anesthesia staff. After appropriate sedation, she was cardioverted with 200 synchronized shock which restored normal sinus rhythm. There were no immediate complications. The patient has been anticoagulated with Xarelto 20 mg once daily for at least one month. She has not missed any dose.   CONCLUSION: Successful cardioversion of atypical atrial flutter into normal sinus rhythm.   RECOMMENDATIONS:  1. The patient's is to continue her current medications including Xarelto 20 mg once daily and amiodarone 200 mg once daily.  2. She is to follow-up in one week or earlier if needed.   ____________________________ Mertie Clause Fletcher Anon, MD maa:drc D: 05/02/2012 08:00:37 ET T: 05/02/2012 10:31:52 ET JOB#: [id number]  cc: Kailand Seda A. Fletcher Anon, MD, <Dictator> [place] MD ELECTRONICALLY SIGNED 05/11/2012 9:05

## 2015-04-13 NOTE — H&P (Signed)
PATIENT NAME:  [name] [name], [name] [name] MR#:  [id number] DATE OF BIRTH:  Jul 12, 1936  DATE OF ADMISSION:  02/02/2012  CHIEF COMPLAINT: Dizziness and weakness.   HISTORY OF PRESENT ILLNESS: This is a 79 year old female who has a history of atrial flutter. She has been cardio converted a couple of times, last time being last March. She said yesterday she kind of felt dizzy, lightheaded. She brought in several blood pressure readings, some of them were down in the 16S systolically and she associated with her symptoms. She did not actually pass out. She lowered her lisinopril from 20 daily to 10 daily but still had same symptoms today. Currently she does not feel that way. Did sit her up and do orthostatics which were normal but she did have some mild symptoms but no change in heart rate. She denied any palpitations during these episodes.   PAST MEDICAL HISTORY:  1. Atrial flutter. a. Status post cardioversion back in March 2012. b. On chronic anticoagulation.  2. Hypertension.   PAST SURGICAL HISTORY:  1. Cholecystectomy.  2. Bladder tack.  3. Mastectomy.   ALLERGIES: Codeine, Coumadin, Demerol and intravenous pyelogram dye.   CURRENT MEDICATIONS:  1. Amiodarone 200 mg daily.  2. Aspirin 81 mg daily.  3. Lisinopril 20 mg at bedtime. 4. Norvasc 5 mg daily.  5. Omeprazole 20 mg daily.  6. Pradaxa 150 mg b.i.d.  7. Tramadol 50 mg p.r.n.   SOCIAL HISTORY: Does not smoke. Does not drink alcohol.   FAMILY HISTORY: Significant for cancer and diabetes.    REVIEW OF SYSTEMS: CONSTITUTIONAL: No fever or chills. EYES: No blurred vision. ENT: No hearing loss. CARDIOVASCULAR: No chest pain. PULMONARY: No shortness of breath. GASTROINTESTINAL: No nausea, vomiting, or diarrhea. GENITOURINARY: No dysuria. ENDOCRINE: No heat or cold intolerance. INTEGUMENT: No rash. MUSCULOSKELETAL: Occasional joint pain. NEUROLOGICAL: No numbness or weakness.   PHYSICAL EXAMINATION:  VITAL SIGNS: Temperature 97.9, pulse 52,  respirations 20, blood pressure 118/56.   GENERAL: This is a well-nourished white female in no acute distress.   HEENT: The pupils are equal, round, reactive to light. Sclerae is not icteric. Oral mucosa is moist. Oropharynx is clear. Nasopharynx is clear.   NECK: Supple. No JVD, lymphadenopathy or thyromegaly.   CARDIOVASCULAR: Regular rate and rhythm. No murmurs, rubs, or gallops.   LUNGS: Clear to auscultation. No dullness to percussion. She is not using accessory muscles.   ABDOMEN: Soft, nontender, nondistended. Bowel sounds are positive. No hepatosplenomegaly. No masses.   EXTREMITIES: There is no edema. No joint deformity. Full range of motion in all extremities.   NEUROLOGIC: Cranial nerves II through XII are intact. She is alert and oriented x4.   SKIN: Moist with no rash.   LABORATORY, DIAGNOSTIC AND RADIOLOGICAL DATA: EKG shows normal sinus rhythm. Troponin 0.03, BUN 19, creatinine 1.42, sodium 139, potassium 4.7.   ASSESSMENT AND PLAN:  1. Presyncope. I suspect this may be from some dehydration. She does have elevated creatinine. Will go ahead and observe her on a monitor. We will check orthostatics.  2. Acute renal failure. Not sure what has caused this. It does not look prerenal but I am going to go ahead and give her fluids and she how she responds. I am also going to hold her ACE inhibitor.  3. Atrial flutter. She is now in normal sinus rhythm, status post cardioversion back last year. She is on Pradaxa for anticoagulation, will continue that.  4. Hypertension. I am holding her lisinopril for now because of the  renal function and will monitor her blood pressure.  TIME SPENT ON ADMISSION: 45 minutes.  ____________________________ Baxter Hire, MD jdj:cms D: 02/02/2012 17:17:13 ET T: 02/02/2012 17:32:15 ET JOB#: [id number]  cc: Baxter Hire, MD, <Dictator> Baxter Hire MD ELECTRONICALLY SIGNED 02/12/2012 17:31

## 2015-04-13 NOTE — Discharge Summary (Signed)
PATIENT NAME:  [name] [name], [name] [name] MR#:  [id number] DATE OF BIRTH:  07-03-1936  DATE OF ADMISSION:  02/02/2012 DATE OF DISCHARGE:  02/03/2012  DISCHARGE DIAGNOSES: 1. Presyncope. 2. Acute renal failure secondary to dehydration and hypotension which has resolved with fluids.  3. Hypertension.  4. History of atrial fibrillation converted to sinus.   DISCHARGE MEDICATIONS:  1. Aspirin 81 mg daily. 2. Omeprazole 20 mg p.o. daily. 3. Red yeast rice 600 mg capsule daily.  4. Tramadol 50 mg p.o. daily as needed.  5. Norvasc 5 mg p.o. daily.   ADDITIONAL MEDICATIONS:  1. Fish oil 2 tablets daily. 2. Amiodarone. The patient is advised to take amiodarone 100 mg daily. She was taking 200, but yesterday heart rate was 45, so Dr. Rockey Situ recommended the patient can cut the pill into half which is 100 mg daily. If the heart rate was up to 60, she can resume the full pill and stop the medication, which is lisinopril.   FOLLOW-UP: See Dr. Rockey Situ in 1 to 2 weeks.   HOSPITAL COURSE: This is a 79 year old female patient with history of hypertension and history of atrial fibrillation converted to sinus in March 2012, who came in because the patient felt lightheaded and her blood pressure was around 90/60 at home and heart rate also was 45 and the patient did not lose consciousness and admitted first to presyncope. The patient was started on IV fluids with normal saline 80 mL/h and lisinopril was held because her BUN was 19 and creatinine 1.42 on admission. We have admitted her because of presyncope secondary to dehydration with acute renal failure. The patient's renal failure got much better and she does not have any symptoms today. The patient's BUN is 17, creatinine 1.08 this morning. Troponins have been negative. Chest x-ray is clear. The patient's WBC is also normal on admission. The patient's blood pressure this morning was 126/60, pulse 54, respirations 18, temperature 96.1. The patient feels perfectly fine.  Spoke with Dr. Rockey Situ over the phone who recommended to stop the lisinopril and also cut the amiodarone to half as I mentioned previously and she can be discharged home. The patient's condition is stable. I discussed the plan with the patient.   TIME SPENT ON DISCHARGE PREPARATION: Around 30 minutes.    ____________________________ Epifanio Lesches, MD sk:ap D: 02/03/2012 11:02:12 ET T: 02/03/2012 16:29:39 ET JOB#: [id number]  cc: Epifanio Lesches, MD, <Dictator> Minna Merritts, MD Epifanio Lesches MD ELECTRONICALLY SIGNED 02/10/2012 9:16

## 2015-04-15 ENCOUNTER — Ambulatory Visit (INDEPENDENT_AMBULATORY_CARE_PROVIDER_SITE_OTHER): Payer: Medicare Other | Admitting: Cardiovascular Disease

## 2015-04-15 ENCOUNTER — Encounter: Payer: Self-pay | Admitting: Cardiovascular Disease

## 2015-04-15 VITALS — BP 149/76 | HR 54 | Ht 59.0 in | Wt 169.2 lb

## 2015-04-15 DIAGNOSIS — I481 Persistent atrial fibrillation: Secondary | ICD-10-CM

## 2015-04-15 DIAGNOSIS — R5383 Other fatigue: Secondary | ICD-10-CM | POA: Diagnosis not present

## 2015-04-15 DIAGNOSIS — I10 Essential (primary) hypertension: Secondary | ICD-10-CM

## 2015-04-15 DIAGNOSIS — G4730 Sleep apnea, unspecified: Secondary | ICD-10-CM | POA: Diagnosis not present

## 2015-04-15 DIAGNOSIS — I4819 Other persistent atrial fibrillation: Secondary | ICD-10-CM

## 2015-04-15 NOTE — Assessment & Plan Note (Signed)
Blood pressure is reasonably controlled. 

## 2015-04-15 NOTE — Progress Notes (Signed)
HPI  [name] [name] is a 79 year old female who is here today for a followup visit.  She has known history of persistent atrial fibrillation/flutter status post multiple cardioversions in the past. 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 She had cardiac catheterization twice in the past without significant coronary artery disease.  Most recent cardioversion was in August of 2014. She did have bradycardia post cardioversion. She has known history of labile hypertension. Most recent Echocardiogram in September 2015 showed normal LV systolic function with moderately dilated left atrium, mild mitral regurgitation, moderate tricuspid regurgitation and moderate to severe pulmonary hypertension.  She had a right knee fracture in January after a fall. She is still recovering from that. She gained a few pounds since then due to decreased activities. She reports worsening fatigue recently. She reports loud snoring at night reported by her husband. She also reports daytime somnolence. She falls asleep when she is in the car with her husband. She had a tick bite recently.  Allergies  Allergen Reactions  . Biaxin [Clarithromycin]   . Codeine   . Iodine   . Promethazine Other (See Comments)  . Warfarin Sodium      Current Outpatient Prescriptions on File Prior to Visit  Medication Sig Dispense Refill  . amiodarone (PACERONE) 200 MG tablet Take 1 tablet (200 mg total) by mouth daily. 30 tablet 6  . amLODipine (NORVASC) 5 MG tablet Take 5 mg by mouth daily.    [name] apixaban (ELIQUIS) 5 MG TABS tablet Take 1 tablet (5 mg total) by mouth 2 (two) times daily. 60 tablet 6  . furosemide (LASIX) 20 MG tablet TAKE ONE (1) TABLET EACH DAY 30 tablet 5  . HYDROcodone-acetaminophen (NORCO/VICODIN) 5-325 MG per tablet Take 1 tablet by mouth every 6 (six) hours as needed for moderate pain. 90 tablet 0  . lisinopril (PRINIVIL,ZESTRIL) 20 MG tablet Take 1 tablet (20 mg total) by  mouth daily. 90 tablet 3  . omeprazole (PRILOSEC) 20 MG capsule TAKE ONE (1) CAPSULE BY MOUTH 2 TIMES DAILY 180 capsule 1  . ondansetron (ZOFRAN-ODT) 8 MG disintegrating tablet Take 1 tablet (8 mg total) by mouth every 8 (eight) hours as needed for nausea or vomiting. 20 tablet 0  . pravastatin (PRAVACHOL) 20 MG tablet Take 1 tablet (20 mg total) by mouth daily. 90 tablet 0   No current facility-administered medications on file prior to visit.     Past Medical History  Diagnosis Date  . Atrial fibrillation     Paroxysmal, hx of  . Cardiomyopathy   . Dyspnea   . Headache(784.0)     chronic  . Hypertension   . Pre-syncope   . Chronic kidney disease     acute renal failure secondary to dehydration which is now resolved  . Light headedness     due to dehydration  . Atrial flutter 02/2011    s/p cardioversion   . Hyperlipidemia   . Knee fracture      Past Surgical History  Procedure Laterality Date  . Cardioversion      x 3  . Cholecystectomy    . Appendectomy    . Multiple orthopedic procedures    . Mastectomy  1986    Bilateral with silicone  breast implants, s/p saline replacements  . Cardiac catheterization    . Combined augmentation mammaplasty and abdominoplasty    . Abdominal hysterectomy  1990  . Total knee arthroplasty Left   . Cardioversion  Family History  Problem Relation Age of Onset  . Heart disease Mother   . Cancer Father     stomach  . Cancer Sister     breast  . Cancer Maternal Grandmother     breast  . Cancer Sister     breast  . Diabetes Neg Hx   . Heart disease Son     found at autopsy     History   Social History  . Marital Status: Married    Spouse Name: N/A  . Number of Children: 1  . Years of Education: N/A   Occupational History  .     Social History Main Topics  . Smoking status: Never Smoker   . Smokeless tobacco: Never Used  . Alcohol Use: No  . Drug Use: No  . Sexual Activity: No   Other Topics Concern  .  Not on file   Social History Narrative     PHYSICAL EXAM   BP 149/76 mmHg  Ht 4\' 11"  (1.499 m)  Wt 169 lb 4 oz (76.771 kg)  BMI 34.17 kg/m2 Constitutional: She is oriented to person, place, and time. She appears well-developed and well-nourished. No distress.  HENT: No nasal discharge.  Head: Normocephalic and atraumatic.  Eyes: Pupils are equal and round. Right eye exhibits no discharge. Left eye exhibits no discharge.  Neck: Normal range of motion. Neck supple. No JVD present. No thyromegaly present.  Cardiovascular: Normal rate, irregular rhythm, normal heart sounds. Exam reveals no gallop and no friction rub. No murmur heard.  Pulmonary/Chest: Effort normal and breath sounds normal. No stridor. No respiratory distress. She has no wheezes. She has no rales. She exhibits no tenderness.  Abdominal: Soft. Bowel sounds are normal. She exhibits no distension. There is no tenderness. There is no rebound and no guarding.  Musculoskeletal: Normal range of motion. She exhibits trace edema and no tenderness.  Neurological: She is alert and oriented to person, place, and time. Coordination normal.  Skin: Skin is warm and dry. No rash noted. She is not diaphoretic. No erythema. No pallor.  Psychiatric: She has a normal mood and affect. Her behavior is normal. Judgment and thought content normal.     EKG: Sinus  Bradycardia  -Right bundle branch block.   ABNORMAL    ASSESSMENT AND PLAN

## 2015-04-15 NOTE — Assessment & Plan Note (Signed)
I advised her to follow-up with Dr.Tullo regarding recent tick bite. She has symptoms highly suggestive of sleep apnea and thus I referred her to pulmonary for evaluation.

## 2015-04-15 NOTE — Patient Instructions (Signed)
Medication Instructions: - No change  Labwork: - none  Procedures/Testing: - none  Follow-Up: - Your physician recommends that you schedule a follow-up appointment in: 3 months with Dr. Fletcher Anon  Any Additional Special Instructions Will Be Listed Below (If Applicable). - You have been referred to : Dr. Stevenson Clinch with [facility] for evaluation for a sleep study

## 2015-04-15 NOTE — Assessment & Plan Note (Signed)
I don't think her fatigue is related to cardiac etiology. She is maintaining a normal sinus rhythm with relatively stable vital signs. Continue treatment with amiodarone and anticoagulation with Eliquis.

## 2015-04-18 ENCOUNTER — Other Ambulatory Visit: Payer: Self-pay | Admitting: Internal Medicine

## 2015-04-18 ENCOUNTER — Other Ambulatory Visit: Payer: Self-pay

## 2015-04-18 MED ORDER — AMIODARONE HCL 200 MG PO TABS: 200.0000 mg | ORAL_TABLET | Freq: Every day | ORAL | Status: DC

## 2015-04-18 NOTE — Telephone Encounter (Signed)
Refill sent for amiodarone hcl 200 mg one tablet daily.

## 2015-04-22 ENCOUNTER — Ambulatory Visit (INDEPENDENT_AMBULATORY_CARE_PROVIDER_SITE_OTHER): Payer: Medicare Other | Admitting: Internal Medicine

## 2015-04-22 ENCOUNTER — Encounter: Payer: Self-pay | Admitting: Internal Medicine

## 2015-04-22 VITALS — BP 123/60 | HR 50 | Temp 97.6°F | Resp 14 | Ht 59.0 in | Wt 169.0 lb

## 2015-04-22 DIAGNOSIS — A938 Other specified arthropod-borne viral fevers: Secondary | ICD-10-CM | POA: Diagnosis not present

## 2015-04-22 DIAGNOSIS — R5382 Chronic fatigue, unspecified: Secondary | ICD-10-CM

## 2015-04-22 DIAGNOSIS — E785 Hyperlipidemia, unspecified: Secondary | ICD-10-CM | POA: Diagnosis not present

## 2015-04-22 DIAGNOSIS — Z8619 Personal history of other infectious and parasitic diseases: Secondary | ICD-10-CM

## 2015-04-22 DIAGNOSIS — R609 Edema, unspecified: Secondary | ICD-10-CM

## 2015-04-22 DIAGNOSIS — E669 Obesity, unspecified: Secondary | ICD-10-CM

## 2015-04-22 DIAGNOSIS — E559 Vitamin D deficiency, unspecified: Secondary | ICD-10-CM | POA: Diagnosis not present

## 2015-04-22 DIAGNOSIS — R0602 Shortness of breath: Secondary | ICD-10-CM

## 2015-04-22 DIAGNOSIS — I272 Pulmonary hypertension, unspecified: Secondary | ICD-10-CM

## 2015-04-22 DIAGNOSIS — Z79899 Other long term (current) drug therapy: Secondary | ICD-10-CM

## 2015-04-22 DIAGNOSIS — R7301 Impaired fasting glucose: Secondary | ICD-10-CM

## 2015-04-22 DIAGNOSIS — I10 Essential (primary) hypertension: Secondary | ICD-10-CM

## 2015-04-22 DIAGNOSIS — I270 Primary pulmonary hypertension: Secondary | ICD-10-CM

## 2015-04-22 LAB — COMPREHENSIVE METABOLIC PANEL
ALT: 14 U/L (ref 0–35)
AST: 15 U/L (ref 0–37)
Albumin: 4 g/dL (ref 3.5–5.2)
Alkaline Phosphatase: 59 U/L (ref 39–117)
BUN: 25 mg/dL — ABNORMAL HIGH (ref 6–23)
CO2: 27 mEq/L (ref 19–32)
Calcium: 9.3 mg/dL (ref 8.4–10.5)
Chloride: 102 mEq/L (ref 96–112)
Creatinine, Ser: 1.35 mg/dL — ABNORMAL HIGH (ref 0.40–1.20)
GFR: 40.39 mL/min — ABNORMAL LOW (ref >60.00)
Glucose, Bld: 95 mg/dL (ref 70–99)
Potassium: 5.1 mEq/L (ref 3.5–5.1)
Sodium: 135 mEq/L (ref 135–145)
Total Bilirubin: 0.9 mg/dL (ref 0.2–1.2)
Total Protein: 7 g/dL (ref 6.0–8.3)

## 2015-04-22 LAB — CBC WITH DIFFERENTIAL/PLATELET
Basophils Absolute: 0 10*3/uL (ref 0.0–0.1)
Basophils Relative: 0.8 % (ref 0.0–3.0)
Eosinophils Absolute: 0.1 10*3/uL (ref 0.0–0.7)
Eosinophils Relative: 0.9 % (ref 0.0–5.0)
HCT: 38.6 % (ref 36.0–46.0)
Hemoglobin: 12.9 g/dL (ref 12.0–15.0)
Lymphocytes Relative: 18.6 % (ref 12.0–46.0)
Lymphs Abs: 1.1 10*3/uL (ref 0.7–4.0)
MCHC: 33.3 g/dL (ref 30.0–36.0)
MCV: 94.7 fl (ref 78.0–100.0)
Monocytes Absolute: 0.3 10*3/uL (ref 0.1–1.0)
Monocytes Relative: 5.8 % (ref 3.0–12.0)
Neutro Abs: 4.2 10*3/uL (ref 1.4–7.7)
Neutrophils Relative %: 73.9 % (ref 43.0–77.0)
Platelets: 234 10*3/uL (ref 150.0–400.0)
RBC: 4.08 Mil/uL (ref 3.87–5.11)
RDW: 14.3 % (ref 11.5–15.5)
WBC: 5.7 10*3/uL (ref 4.0–10.5)

## 2015-04-22 LAB — VITAMIN B12: Vitamin B-12: 324 pg/mL (ref 211–911)

## 2015-04-22 LAB — TSH: TSH: 2.03 u[IU]/mL (ref 0.35–4.50)

## 2015-04-22 LAB — VITAMIN D 25 HYDROXY (VIT D DEFICIENCY, FRACTURES): VITD: 18.39 ng/mL — ABNORMAL LOW (ref 30.00–100.00)

## 2015-04-22 NOTE — Progress Notes (Signed)
Pre-visit discussion using our clinic review tool. No additional management support is needed unless otherwise documented below in the visit note.  

## 2015-04-22 NOTE — Progress Notes (Signed)
Patient ID: [name] [name], female   DOB: 1936/12/12, 79 y.o.   MRN: [id number]  Patient Active Problem List   Diagnosis Date Noted  . Edema 04/24/2015  . Impaired fasting glucose 04/24/2015  . Pulmonary hypertension, moderate to severe 04/24/2015  . Tick borne fever 04/22/2015  . Acute renal failure 01/14/2015  . 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 Vitamin D deficiency 12/27/2014  . S/P TAH-BSO 12/13/2014  . S/P bilateral mastectomy 12/13/2014  . Long term current use of anticoagulant therapy 09/25/2014  . HH (hiatus hernia) 04/20/2014  . S/P hysterectomy 11/15/2013  . Tachycardia induced cardiomyopathy 07/20/2013  . Unspecified vitamin D deficiency 04/24/2013  . Family history of breast cancer in female 04/23/2013  . Medicare annual wellness visit, subsequent 04/23/2013  . Preop cardiovascular exam 04/09/2013  . Obesity 06/30/2012  . Fatigue 06/22/2012  . History of Rocky Mountain spotted fever 06/22/2012  . Persistent atrial fibrillation 05/09/2012  . Anxiety and depression 05/09/2012  . TACHYCARDIA 02/01/2011  . Hyperlipidemia 07/08/2010  . Essential hypertension 03/05/2009  . HEADACHE, CHRONIC 03/05/2009  . DYSPNEA 03/05/2009    Subjective:  CC:   Chief Complaint  Patient presents with  . Follow-up    Tick bite left inner thigh,   . Fatigue    saw Dr. Fletcher Anon for fatigue and low BP while outside.  . Shortness of Breath    On exertcion ECG performed by Dr>Arida was fine.stated by patient.    HPI:   [name] [name] is a 79 y.o. female who presents for fFollow up on multiple issues including  Persistent fatigue, shortness of breath ,  Dizziness with exertion .  Cardiac workup was done by [facility] and negative for reversible ischemia.  Symptoms are aggravated by bending over,  Has learned to sit down  To garden    Arida did not adjust blood pressure  Meds.  Home BPs have been as low as 90/64 at times  ,  Takes the lisinopril and  amlodipine at bedtime.  She has a history of  labile HTN with multiple ER visits for HTN urgency, none recetmly.    Her legs are  swelling more now,  Last night had an episde of nocturia x 4 last night,  Typically has nocturia x 2,    Pulled an engorged tick off of her leg 1.5 weeks ago,  Has had a headache for the past day due to rain,  But denies any recent subjective or objective fevers . She reports a History of "tick fever"  Over 8 years ago,  few years ago, diagnosed per patient as Lymes Disease,  was treated with doxycycline .     Past Medical History  Diagnosis Date  . Atrial fibrillation     Paroxysmal, hx of  . Cardiomyopathy   . Dyspnea   . Headache(784.0)     chronic  . Hypertension   . Pre-syncope   . Chronic kidney disease     acute renal failure secondary to dehydration which is now resolved  . Light headedness     due to dehydration  . Atrial flutter 02/2011    s/p cardioversion   . Hyperlipidemia   . Knee fracture     Past Surgical History  Procedure Laterality Date  . Cardioversion      x 3  . Cholecystectomy    . Appendectomy    . Multiple orthopedic procedures    . Mastectomy  1986    Bilateral with silicone  breast implants, s/p saline replacements  . Cardiac catheterization    . Combined augmentation mammaplasty and abdominoplasty    . Abdominal hysterectomy  1990  . Total knee arthroplasty Left   . Cardioversion         The following portions of the patient's history were reviewed and updated as appropriate: Allergies, current medications, and problem list.    Review of Systems:   Patient denies headache, fevers, malaise, unintentional weight loss, skin rash, eye pain, sinus congestion and sinus pain, sore throat, dysphagia,  hemoptysis , cough, dyspnea, wheezing, chest pain, palpitations, orthopnea, edema, abdominal pain, nausea, melena, diarrhea, constipation, flank pain, dysuria, hematuria, urinary  Frequency, nocturia, numbness,  tingling, seizures,  Focal weakness, Loss of consciousness,  Tremor, insomnia, depression, anxiety, and suicidal ideation.     History   Social History  . Marital Status: Married    Spouse Name: N/A  . Number of Children: 1  . Years of Education: N/A   Occupational History  .     Social History Main Topics  . Smoking status: Never Smoker   . Smokeless tobacco: Never Used  . Alcohol Use: No  . Drug Use: No  . Sexual Activity: No   Other Topics Concern  . Not on file   Social History Narrative    Objective:  Filed Vitals:   04/22/15 1010  BP: 123/60  Pulse: 50  Temp: 97.6 F (36.4 C)  Resp: 14     General appearance: alert, cooperative and appears stated age Ears: normal TM's and external ear canals both ears Throat: lips, mucosa, and tongue normal; teeth and gums normal Neck: no adenopathy, no carotid bruit, supple, symmetrical, trachea midline and thyroid not enlarged, symmetric, no tenderness/mass/nodules Back: symmetric, no curvature. ROM normal. No CVA tenderness. Lungs: clear to auscultation bilaterally Heart: regular rate and rhythm, S1, S2 normal, no murmur, click, rub or gallop Abdomen: soft, non-tender; bowel sounds normal; no masses,  no organomegaly Pulses: 2+ and symmetric Skin: Skin color, texture, turgor normal. No rashes or lesions. 1+ pitting edema bilaterally Lymph nodes: Cervical, supraclavicular, and axillary nodes normal.  Assessment and Plan:  Essential hypertension Changing amlodipine to am, continue lisinopril q pm to avoid daytime hypotension.  stopping furosemide for decrease in GFR noted on today's labs . Repeat BMET one week   Lab Results  Component Value Date   NA 135 04/22/2015   K 5.1 04/22/2015   CL 102 04/22/2015   CO2 27 04/22/2015  \ Lab Results  Component Value Date   CREATININE 1.35* 04/22/2015        History of Rocky Mountain spotted fever She continues to report history of "tick fever" .  Her RMSF titers  were equivocal last year.  Lyme antibodies are pending.  Current tick bite is asymptomatic,  Will not treat,    Edema Likely secondary to diastolic dysfunction and concurrent use of amlodipine, now with declining GFR possibly due to daily use of furosemide.  Will order bilateral LE venous Ultrasound to determine if venous insufficiency is present, and recommend use of compression stockings.    Obesity I have addressed  BMI and recommended a low glycemic index diet utilizing smaller more frequent meals to increase metabolism.  I have also recommended that patient start exercising with a goal of 30 minutes of aerobic exercise a minimum of 5 days per week.  She has  Impaired glucose tolerance with A1c of 6.3  Lab Results  Component Value Date   TSH 2.03  04/22/2015   Lab Results  Component Value Date   CHOL 174 04/22/2015   HDL 61.00 04/22/2015   LDLCALC 97 04/22/2015   TRIG 82.0 04/22/2015   CHOLHDL 3 04/22/2015   No results found for: HGBA1C    DYSPNEA Persistent, may be due to pulmonary hypertension suggested by September 2015 TEE.  Will have Dr. Gwenette Greet address if she continues to have symptoms preventing participation in age apprpriate exercise.   A total of 40 minutes was spent with patient more than half of which was spent in counseling patient on the above mentioned issues , reviewing and explaining recent labs and imaging studies done, and coordination of care.  Updated Medication List Outpatient Encounter Prescriptions as of 04/22/2015  Medication Sig  . amiodarone (PACERONE) 200 MG tablet Take 1 tablet (200 mg total) by mouth daily.  [name] amLODipine (NORVASC) 5 MG tablet Take 5 mg by mouth daily.  [name] apixaban (ELIQUIS) 5 MG TABS tablet Take 1 tablet (5 mg total) by mouth 2 (two) times daily.  . cholecalciferol (VITAMIN D) 1000 UNITS tablet Take 1,000 Units by mouth daily.  [name] HYDROcodone-acetaminophen (NORCO/VICODIN) 5-325 MG per tablet Take 1 tablet by mouth every 6 (six) hours  as needed for moderate pain.  [name] lisinopril (PRINIVIL,ZESTRIL) 20 MG tablet Take 1 tablet (20 mg total) by mouth daily.  [name] omeprazole (PRILOSEC) 20 MG capsule TAKE ONE (1) CAPSULE BY MOUTH 2 TIMES DAILY  . ondansetron (ZOFRAN-ODT) 8 MG disintegrating tablet Take 1 tablet (8 mg total) by mouth every 8 (eight) hours as needed for nausea or vomiting.  . pravastatin (PRAVACHOL) 20 MG tablet TAKE ONE (1) TABLET BY MOUTH EVERY DAY  . [DISCONTINUED] furosemide (LASIX) 20 MG tablet TAKE ONE (1) TABLET EACH DAY   No facility-administered encounter medications on file as of 04/22/2015.     Orders Placed This Encounter  Procedures  . B12  . Folate RBC  . CBC with Differential/Platelet  . TSH  . Comprehensive metabolic panel  . Lipid panel  . Vit D  25 hydroxy (rtn osteoporosis monitoring)  . Lyme Ab, Total/IgM Responses  . Ambulatory referral to Vascular Surgery    Return in about 3 months (around 07/23/2015).

## 2015-04-22 NOTE — Patient Instructions (Addendum)
Fluid retention in the legs (edema) can happen for many reasons,  Including heart failure, kidney disease, underactive thyroid, deep vein thromboses, and venous insufficiency, and finally medications like Advil,  Aleve  And amlodipine (but not tylenol)   1) Ultrasound of your legs to rule out  venous insufficiency  2)blood and urine test to evaluated thyroid and kidney function   The treatment depends on the cause.  Let's Change amlodipine to 2.5 mg in the morning ,  Continue lisinopril at night   If the fluid persists,  We will order the ultrasound  Continue  The  fluid pill (furosemide)  Daily   I  want you to lose 24 lbs over the next 6 moths to get your BMI < 25   This is  my version of a  "Low GI"  Weight loss Diet:  It will allow you to lose 4 to 8  lbs  per month if you follow it carefully.  Your goal with exercise is a minimum of 30 minutes of aerobic exercise 5 days per week (Walking does not count once it becomes easy!)     All of the foods can be found at grocery stores and in bulk at [employer].  The Atkins protein bars and shakes are available in more varieties at Target, [employer] and [place].     7 AM Breakfast:  Choose from the following:  Low carbohydrate Protein  Shakes (I recommend the EAS AdvantEdge "Carb Control" shakes, Atkins,  Muscle Milk or Premier Protein shakes  All are < 4  carbs   a scrambled egg/bacon/cheese burrito made with Mission's "carb balance" whole wheat tortilla  (about 10 net carbs )  A slice of home made fritatta (egg based dish without a crust:  google it)    Avoid cereal and bananas, oatmeal and cream of wheat and grits. They are loaded with carbohydrates!   10 AM: high protein snack  Protein bar by Atkins  Or KIND  (the snack size, under 200 cal, usually < 6 net carbs).    A stick of cheese:  Around 1 carb,  100 cal      Other so called "protein bars" and Greek yogurts tend to be loaded with carbohydrates.  Remember, in food advertising,  the word "energy" is synonymous for " carbohydrate."  Lunch:   A Sandwich using the bread choices listed, Can use any  Eggs,  lunchmeat, grilled meat or canned tuna), avocado, regular mayo/mustard  and cheese.  A Salad using blue cheese, ranch,  Goddess or vinagrette,  No croutons or "confetti" and no "candied nuts" but regular nuts OK.   No pretzels or chips.  Pickles and miniature sweet peppers are a good low carb alternative that provide a "crunch"  The bread is the only source of carbohydrate in a sandwich and  can be decreased by trying some of these alternatives to traditional loaf bread  Joseph's makes a pita bread and a flat bread that are 50 cal and 4 net carbs available at [place] and [place].  This can be toasted to use with hummous as well  Toufayan makes a low carb flatbread that's 100 cal and 9 net carbs available at [employer] and [employer]'s makes 2 sizes of  Low carb whole wheat tortilla  (The large one is 210 cal and 6 net carbs)  Flat Out makes flatbreads that are low carb as well  Avoid "Low fat dressings, as well as Barry Brunner and  [facility] dressings They are loaded with sugar!   3 PM/ Mid day  Snack:  Consider  1 ounce of  almonds, walnuts, pistachios, pecans, peanuts,  Macadamia nuts or a nut medley.  Avoid "granola"; the dried cranberries and raisins are loaded with carbohydrates. Mixed nuts as long as there are no raisins,  cranberries or dried fruit.    Try the prosciutto/mozzarella cheese sticks by [employer]  In deli /backery section   High protein   To avoid overindulging in snacks: Try drinking a glass of unsweeted almond/coconut milk  Or a cup of coffee with your Atkins chocolate bar to keep you from having 3!!!   Pork rinds!  Yes Pork Rinds        6 PM  Dinner:     Meat/fowl/fish with a green salad, and either broccoli, cauliflower, green beans, spinach, brussel sprouts or  Lima beans. DO NOT BREAD THE PROTEIN!!      There is a low carb pasta by Dreamfield's  that is acceptable and tastes great: only 5 digestible carbs/serving.( All grocery stores but BJs carry it )  Try Hurley Cisco Angelo's chicken piccata or chicken or eggplant parm over low carb pasta.(Lowes and BJs)   Marjory Lies Sanchez's "Carnitas" (pulled pork, no sauce,  0 carbs) or his beef pot roast to make a dinner burrito (at [employer]J's)  Pesto over low carb pasta (bj's sells a good quality pesto in the center refrigerated section of the deli   Try satueeing  Cheral Marker with mushroooms  Whole wheat pasta is still full of digestible carbs and  Not as low in glycemic index as Dreamfield's.   Brown rice is still rice,  So skip the rice and noodles if you eat [place] or [place] (or at least limit to 1/2 cup)  9 PM snack :   Breyer's "low carb" fudgsicle or  ice cream bar (Carb Smart line), or  Weight Watcher's ice cream bar , or another "no sugar added" ice cream;  a serving of fresh berries/cherries with whipped cream   Cheese or DANNON'S LlGHT N FIT GREEK YOGURT or the [employer] greek yogurt   8 ounces of [place] unsweetened almond/cococunut milk  Cheese and crackers (using WASA crackers,  They are low carb) or peanut butter on low carb crackers or pita bread     Avoid bananas, pineapple, grapes  and watermelon on a regular basis because they are high in sugar.  THINK OF THEM AS DESSERT  Remember that snack Substitutions should be less than 10 NET carbs per serving and meals should be < 20 net carbs. Remember that carbohydrates from fiber do not affect blood sugar, so you can  subtract fiber grams to get the "net carbs " of any particular food item.

## 2015-04-23 ENCOUNTER — Other Ambulatory Visit: Payer: Self-pay | Admitting: Internal Medicine

## 2015-04-23 DIAGNOSIS — N179 Acute kidney failure, unspecified: Secondary | ICD-10-CM

## 2015-04-23 LAB — LIPID PANEL
Cholesterol: 174 mg/dL (ref 0–200)
HDL: 61 mg/dL (ref >39.00)
LDL Cholesterol: 97 mg/dL (ref 0–99)
NonHDL: 113
Total CHOL/HDL Ratio: 3
Triglycerides: 82 mg/dL (ref 0.0–149.0)
VLDL: 16.4 mg/dL (ref 0.0–40.0)

## 2015-04-23 LAB — FOLATE RBC: RBC Folate: 806 ng/mL (ref >280)

## 2015-04-24 DIAGNOSIS — R609 Edema, unspecified: Secondary | ICD-10-CM | POA: Insufficient documentation

## 2015-04-24 DIAGNOSIS — I272 Pulmonary hypertension, unspecified: Secondary | ICD-10-CM | POA: Insufficient documentation

## 2015-04-24 DIAGNOSIS — R7301 Impaired fasting glucose: Secondary | ICD-10-CM | POA: Insufficient documentation

## 2015-04-24 NOTE — Assessment & Plan Note (Addendum)
Persistent, may be due to pulmonary hypertension suggested by September 2015 TEE.  Will have Dr. Gwenette Greet address if she continues to have symptoms preventing participation in age apprpriate exercise.

## 2015-04-24 NOTE — Assessment & Plan Note (Signed)
She continues to report history of "tick fever" .  Her RMSF titers were equivocal last year.  Lyme antibodies are pending.  Current tick bite is asymptomatic,  Will not treat,

## 2015-04-24 NOTE — Assessment & Plan Note (Addendum)
I have addressed  BMI and recommended a low glycemic index diet utilizing smaller more frequent meals to increase metabolism.  I have also recommended that patient start exercising with a goal of 30 minutes of aerobic exercise a minimum of 5 days per week.  She has  Impaired glucose tolerance with A1c of 6.3  Lab Results  Component Value Date   TSH 2.03 04/22/2015   Lab Results  Component Value Date   CHOL 174 04/22/2015   HDL 61.00 04/22/2015   LDLCALC 97 04/22/2015   TRIG 82.0 04/22/2015   CHOLHDL 3 04/22/2015   No results found for: HGBA1C

## 2015-04-24 NOTE — Assessment & Plan Note (Signed)
Likely secondary to diastolic dysfunction and concurrent use of amlodipine, now with declining GFR possibly due to daily use of furosemide.  Will order bilateral LE venous Ultrasound to determine if venous insufficiency is present, and recommend use of compression stockings.

## 2015-04-24 NOTE — Assessment & Plan Note (Addendum)
Changing amlodipine to am, continue lisinopril q pm to avoid daytime hypotension.  stopping furosemide for decrease in GFR noted on today's labs . Repeat BMET one week   Lab Results  Component Value Date   NA 135 04/22/2015   K 5.1 04/22/2015   CL 102 04/22/2015   CO2 27 04/22/2015  \ Lab Results  Component Value Date   CREATININE 1.35* 04/22/2015

## 2015-04-25 LAB — LYME AB/WESTERN BLOT REFLEX
LYME DISEASE AB, QUANT, IGM: <0.8 index (ref 0.00–0.79)
Lyme IgG/IgM Ab: <0.91 {ISR} (ref 0.00–0.90)

## 2015-05-01 ENCOUNTER — Other Ambulatory Visit (INDEPENDENT_AMBULATORY_CARE_PROVIDER_SITE_OTHER): Payer: Medicare Other

## 2015-05-01 DIAGNOSIS — N179 Acute kidney failure, unspecified: Secondary | ICD-10-CM

## 2015-05-01 LAB — BASIC METABOLIC PANEL
BUN: 18 mg/dL (ref 6–23)
CO2: 29 mEq/L (ref 19–32)
Calcium: 9.5 mg/dL (ref 8.4–10.5)
Chloride: 103 mEq/L (ref 96–112)
Creatinine, Ser: 0.99 mg/dL (ref 0.40–1.20)
GFR: 57.77 mL/min — ABNORMAL LOW (ref >60.00)
Glucose, Bld: 89 mg/dL (ref 70–99)
Potassium: 4.1 mEq/L (ref 3.5–5.1)
Sodium: 136 mEq/L (ref 135–145)

## 2015-05-06 ENCOUNTER — Other Ambulatory Visit: Payer: Self-pay | Admitting: Internal Medicine

## 2015-05-06 DIAGNOSIS — Z1231 Encounter for screening mammogram for malignant neoplasm of breast: Secondary | ICD-10-CM

## 2015-05-16 ENCOUNTER — Other Ambulatory Visit: Payer: Self-pay | Admitting: Internal Medicine

## 2015-05-16 ENCOUNTER — Ambulatory Visit
Admission: RE | Admit: 2015-05-16 | Discharge: 2015-05-16 | Disposition: A | Discharge status: Home / Self Care | Payer: Medicare Other | Source: Ambulatory Visit | Attending: Internal Medicine | Admitting: Internal Medicine

## 2015-05-16 DIAGNOSIS — Z1231 Encounter for screening mammogram for malignant neoplasm of breast: Secondary | ICD-10-CM

## 2015-05-20 ENCOUNTER — Encounter: Payer: Self-pay | Admitting: *Deleted

## 2015-06-03 DIAGNOSIS — L821 Other seborrheic keratosis: Secondary | ICD-10-CM | POA: Diagnosis not present

## 2015-06-03 DIAGNOSIS — I8312 Varicose veins of left lower extremity with inflammation: Secondary | ICD-10-CM | POA: Diagnosis not present

## 2015-06-03 DIAGNOSIS — L650 Telogen effluvium: Secondary | ICD-10-CM | POA: Diagnosis not present

## 2015-06-09 ENCOUNTER — Telehealth: Payer: Self-pay

## 2015-06-09 NOTE — Telephone Encounter (Signed)
Pt would like Eliquis 5 mg samples.

## 2015-06-09 NOTE — Telephone Encounter (Signed)
Eliquis 5 mg samples placed at front desk for pick up. 

## 2015-06-12 ENCOUNTER — Encounter: Payer: Self-pay | Admitting: Internal Medicine

## 2015-06-12 ENCOUNTER — Ambulatory Visit (INDEPENDENT_AMBULATORY_CARE_PROVIDER_SITE_OTHER): Payer: Medicare Other | Admitting: Internal Medicine

## 2015-06-12 VITALS — BP 118/68 | HR 53 | Temp 97.7°F | Resp 14 | Ht 63.25 in | Wt 163.2 lb

## 2015-06-12 DIAGNOSIS — T380X5A Adverse effect of glucocorticoids and synthetic analogues, initial encounter: Secondary | ICD-10-CM

## 2015-06-12 DIAGNOSIS — I10 Essential (primary) hypertension: Secondary | ICD-10-CM

## 2015-06-12 DIAGNOSIS — R5383 Other fatigue: Secondary | ICD-10-CM

## 2015-06-12 DIAGNOSIS — E273 Drug-induced adrenocortical insufficiency: Secondary | ICD-10-CM | POA: Diagnosis not present

## 2015-06-12 DIAGNOSIS — E559 Vitamin D deficiency, unspecified: Secondary | ICD-10-CM

## 2015-06-12 MED ORDER — ERGOCALCIFEROL 1.25 MG (50000 UT) PO CAPS: 50000.0000 [IU] | ORAL_CAPSULE | ORAL | Status: DC

## 2015-06-12 NOTE — Patient Instructions (Addendum)
I an repeating your megadose of vitamin d  Because your level was low again.  We will keep you on it indefinitel  You can add " Hair , Skin and nails " as a daily supplement  Bone density test has been ordered   The ultrasound is being done rule out venous insufficiency  Venous Stasis or Chronic Venous Insufficiency Chronic venous insufficiency, also called venous stasis, is a condition that affects the veins in the legs. The condition prevents blood from being pumped through these veins effectively. Blood may no longer be pumped effectively from the legs back to the heart. This condition can range from mild to severe. With proper treatment, you should be able to continue with an active life. CAUSES  Chronic venous insufficiency occurs when the vein walls become stretched, weakened, or damaged or when valves within the vein are damaged. Some common causes of this include:  High blood pressure inside the veins (venous hypertension).  Increased blood pressure in the leg veins from long periods of sitting or standing.  A blood clot that blocks blood flow in a vein (deep vein thrombosis).  Inflammation of a superficial vein (phlebitis) that causes a blood clot to form. RISK FACTORS Various things can make you more likely to develop chronic venous insufficiency, including:  Family history of this condition.  Obesity.  Pregnancy.  Sedentary lifestyle.  Smoking.  Jobs requiring long periods of standing or sitting in one place.  Being a certain age. Women in their 81s and 27s and men in their 13s are more likely to develop this condition. SIGNS AND SYMPTOMS  Symptoms may include:   Varicose veins.  Skin breakdown or ulcers.  Reddened or discolored skin on the leg.  Brown, smooth, tight, and painful skin just above the ankle, usually on the inside surface (lipodermatosclerosis).  Swelling. DIAGNOSIS  To diagnose this condition, your health care provider will take a medical  history and do a physical exam. The following tests may be ordered to confirm the diagnosis:  Duplex ultrasound--A procedure that produces a picture of a blood vessel and nearby organs and also provides information on blood flow through the blood vessel.  Plethysmography--A procedure that tests blood flow.  A venogram, or venography--A procedure used to look at the veins using X-ray and dye. TREATMENT The goals of treatment are to help you return to an active life and to minimize pain or disability. Treatment will depend on the severity of the condition. Medical procedures may be needed for severe cases. Treatment options may include:   Use of compression stockings. These can help with symptoms and lower the chances of the problem getting worse, but they do not cure the problem.  Sclerotherapy--A procedure involving an injection of a material that "dissolves" the damaged veins. Other veins in the network of blood vessels take over the function of the damaged veins.  Surgery to remove the vein or cut off blood flow through the vein (vein stripping or laser ablation surgery).  Surgery to repair a valve. HOME CARE INSTRUCTIONS   Wear compression stockings as directed by your health care provider.  Only take over-the-counter or prescription medicines for pain, discomfort, or fever as directed by your health care provider.  Follow up with your health care provider as directed. SEEK MEDICAL CARE IF:   You have redness, swelling, or increasing pain in the affected area.  You see a red streak or line that extends up or down from the affected area.  You have  a breakdown or loss of skin in the affected area, even if the breakdown is small.  You have an injury to the affected area. SEEK IMMEDIATE MEDICAL CARE IF:   You have an injury and open wound in the affected area.  Your pain is severe and does not improve with medicine.  You have sudden numbness or weakness in the foot or ankle  below the affected area, or you have trouble moving your foot or ankle.  You have a fever or persistent symptoms for more than 2-3 days.  You have a fever and your symptoms suddenly get worse. MAKE SURE YOU:   Understand these instructions.  Will watch your condition.  Will get help right away if you are not doing well or get worse. Document Released: 04/11/2007 Document Revised: 09/26/2013 Document Reviewed: 08/13/2013 [facility] Patient Information 2015 [place], [place]. This information is not intended to replace advice given to you by your health care provider. Make sure you discuss any questions you have with your health care provider.

## 2015-06-12 NOTE — Progress Notes (Signed)
Subjective:  Patient ID: [name] [name], female    DOB: 1936-03-29  Age: 79 y.o. MRN: [id number]  CC: The primary encounter diagnosis was Essential hypertension. 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  HPI [name] [name] presents for follow up on hypertension and malaise. She feels generally well,   Stopped the lisinooril,  Using norvasc at night and home BPs have been less labile. morning 132/68   Hair loss, scalp issues.  Not tolerating topical steroids fluocinonide  Leg ultrasound bilateral  Scheduled for jun 29th   Outpatient Prescriptions Prior to Visit  Medication Sig Dispense Refill  . amiodarone (PACERONE) 200 MG tablet Take 1 tablet (200 mg total) by mouth daily. 30 tablet 6  . amLODipine (NORVASC) 5 MG tablet Take 5 mg by mouth daily.    [name] apixaban (ELIQUIS) 5 MG TABS tablet Take 1 tablet (5 mg total) by mouth 2 (two) times daily. 60 tablet 6  . cholecalciferol (VITAMIN D) 1000 UNITS tablet Take 1,000 Units by mouth daily.    [name] HYDROcodone-acetaminophen (NORCO/VICODIN) 5-325 MG per tablet Take 1 tablet by mouth every 6 (six) hours as needed for moderate pain. 90 tablet 0  . omeprazole (PRILOSEC) 20 MG capsule TAKE ONE (1) CAPSULE BY MOUTH 2 TIMES DAILY 180 capsule 1  . ondansetron (ZOFRAN-ODT) 8 MG disintegrating tablet Take 1 tablet (8 mg total) by mouth every 8 (eight) hours as needed for nausea or vomiting. 20 tablet 0  . pravastatin (PRAVACHOL) 20 MG tablet TAKE ONE (1) TABLET BY MOUTH EVERY DAY 90 tablet 1  . lisinopril (PRINIVIL,ZESTRIL) 20 MG tablet Take 1 tablet (20 mg total) by mouth daily. (Patient not taking: Reported on 06/12/2015) 90 tablet 3   No facility-administered medications prior to visit.    Review of Systems;  Patient denies headache, fevers, malaise, unintentional weight loss, skin rash, eye pain, sinus congestion and sinus pain, sore throat,  dysphagia,  hemoptysis , cough, dyspnea, wheezing, chest pain, palpitations, orthopnea, edema, abdominal pain, nausea, melena, diarrhea, constipation, flank pain, dysuria, hematuria, urinary  Frequency, nocturia, numbness, tingling, seizures,  Focal weakness, Loss of consciousness,  Tremor, insomnia, depression, anxiety, and suicidal ideation.      Objective:  BP 118/68 mmHg  Pulse 53  Temp(Src) 97.7 F (36.5 C) (Oral)  Resp 14  Ht 5' 3.25" (1.607 m)  Wt 163 lb 4 oz (74.05 kg)  BMI 28.67 kg/m2  SpO2 98%  BP Readings from Last 3 Encounters:  06/12/15 118/68  04/22/15 123/60  04/15/15 149/76    Wt Readings from Last 3 Encounters:  06/12/15 163 lb 4 oz (74.05 kg)  04/22/15 169 lb (76.658 kg)  04/15/15 169 lb 4 oz (76.771 kg)    General appearance: alert, cooperative and appears stated age Ears: normal TM's and external ear canals both ears Throat: lips, mucosa, and tongue normal; teeth and gums normal Neck: no adenopathy, no carotid bruit, supple, symmetrical, trachea midline and thyroid not enlarged, symmetric, no tenderness/mass/nodules Back: symmetric, no curvature. ROM normal. No CVA tenderness. Lungs: clear to auscultation bilaterally Heart: regular rate and rhythm, S1, S2 normal, no murmur, click, rub or gallop Abdomen: soft, non-tender; bowel sounds normal; no masses,  no organomegaly Pulses: 2+ and symmetric Skin: Skin color, texture, turgor normal. No rashes or lesions Lymph nodes: Cervical, supraclavicular, and axillary nodes normal.  No results found for: HGBA1C  Lab Results  Component Value Date   CREATININE  0.99 05/01/2015   CREATININE 1.35* 04/22/2015   CREATININE 1.12 03/12/2015    Lab Results  Component Value Date   WBC 5.7 04/22/2015   HGB 12.9 04/22/2015   HCT 38.6 04/22/2015   PLT 234.0 04/22/2015   GLUCOSE 89 05/01/2015   CHOL 174 04/22/2015   TRIG 82.0 04/22/2015   HDL 61.00 04/22/2015   LDLCALC 97 04/22/2015   ALT 14 04/22/2015   AST 15  04/22/2015   NA 136 05/01/2015   K 4.1 05/01/2015   CL 103 05/01/2015   CREATININE 0.99 05/01/2015   BUN 18 05/01/2015   CO2 29 05/01/2015   TSH 2.03 04/22/2015   INR 1.6 11/05/2013    Mm Digital Screening W/ Implants Bilateral  05/16/2015   CLINICAL DATA:  Screening.  EXAM: DIGITAL SCREENING BILATERAL MAMMOGRAM WITH IMPLANTS AND CAD  The patient has retropectoral implants. Standard and implant displaced views were performed.  COMPARISON:  Previous exam(s).  ACR Breast Density Category b: There are scattered areas of fibroglandular density.  FINDINGS: There are no findings suspicious for malignancy. Images were processed with CAD.  IMPRESSION: No mammographic evidence of malignancy. A result letter of this screening mammogram will be mailed directly to the patient.  RECOMMENDATION: Screening mammogram in one year. (Code:[id number]-B-01Y)  BI-RADS CATEGORY  1:  Negative.   Electronically Signed   By: Nolon Nations M.D.   On: 05/16/2015 15:17    Assessment & Plan:   Problem List Items Addressed This Visit    Essential hypertension - Primary    Well controlled on current regimen. Renal function stable, no changes today.  Lab Results  Component Value Date   CREATININE 0.99 05/01/2015   Lab Results  Component Value Date   NA 136 05/01/2015   K 4.1 05/01/2015   CL 103 05/01/2015   CO2 29 05/01/2015         Fatigue    Wit hair loss and scalp issues.  Thyroid function is normal. .  Encouraged to exercise  Lab Results  Component Value Date   TSH 2.03 04/22/2015         Vitamin D deficiency    Replacing with megadose.       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         A total of 25 minutes of face to face time was spent with patient more than half of which was spent in counselling and coordination of care   I am having [name] start on ergocalciferol. I am also having her maintain her lisinopril, amLODipine,  omeprazole, apixaban, HYDROcodone-acetaminophen, ondansetron, cholecalciferol, amiodarone, and pravastatin.  Meds ordered this encounter  Medications  . ergocalciferol (DRISDOL) 50000 UNITS capsule    Sig: Take 1 capsule (50,000 Units total) by mouth once a week.    Dispense:  12 capsule    Refill:  0   There are no discontinued medications.  Follow-up: Return in about 6 months (around 12/12/2015).   [name], MD

## 2015-06-15 NOTE — Assessment & Plan Note (Addendum)
Wit hair loss and scalp issues.  Thyroid function is normal. .  Encouraged to exercise  Lab Results  Component Value Date   TSH 2.03 04/22/2015

## 2015-06-15 NOTE — Assessment & Plan Note (Signed)
Well controlled on current regimen. Renal function stable, no changes today.  Lab Results  Component Value Date   CREATININE 0.99 05/01/2015   Lab Results  Component Value Date   NA 136 05/01/2015   K 4.1 05/01/2015   CL 103 05/01/2015   CO2 29 05/01/2015

## 2015-06-15 NOTE — Assessment & Plan Note (Signed)
Replacing with megadose.

## 2015-06-15 NOTE — Assessment & Plan Note (Signed)
Resolved with prolonged steroid taper.

## 2015-06-18 DIAGNOSIS — M7989 Other specified soft tissue disorders: Secondary | ICD-10-CM | POA: Diagnosis not present

## 2015-06-25 ENCOUNTER — Other Ambulatory Visit: Payer: Self-pay

## 2015-06-25 MED ORDER — OMEPRAZOLE 20 MG PO CPDR: 20.0000 mg | DELAYED_RELEASE_CAPSULE | Freq: Every day | ORAL | Status: DC

## 2015-06-25 NOTE — Telephone Encounter (Signed)
Spoke with the patient and confirmed pharmacy.  RX sent.

## 2015-07-17 ENCOUNTER — Telehealth: Payer: Self-pay

## 2015-07-17 ENCOUNTER — Ambulatory Visit (INDEPENDENT_AMBULATORY_CARE_PROVIDER_SITE_OTHER): Payer: Medicare Other | Admitting: Cardiovascular Disease

## 2015-07-17 ENCOUNTER — Encounter: Payer: Self-pay | Admitting: Cardiovascular Disease

## 2015-07-17 VITALS — BP 110/56 | HR 52 | Ht 64.0 in | Wt 159.5 lb

## 2015-07-17 DIAGNOSIS — I481 Persistent atrial fibrillation: Secondary | ICD-10-CM | POA: Diagnosis not present

## 2015-07-17 DIAGNOSIS — I10 Essential (primary) hypertension: Secondary | ICD-10-CM

## 2015-07-17 DIAGNOSIS — I4819 Other persistent atrial fibrillation: Secondary | ICD-10-CM

## 2015-07-17 MED ORDER — AMIODARONE HCL 100 MG PO TABS: 100.0000 mg | ORAL_TABLET | Freq: Every day | ORAL | Status: DC

## 2015-07-17 MED ORDER — AMLODIPINE BESYLATE 2.5 MG PO TABS: 2.5000 mg | ORAL_TABLET | Freq: Every day | ORAL | Status: DC

## 2015-07-17 NOTE — Patient Instructions (Addendum)
Medication Instructions:  Your physician has recommended you make the following change in your medication:  DECREASE amlodipine to 2.5mg  once per day DECREASE amiodarone to 100mg  once per day  Labwork: none  Testing/Procedures: none  Follow-Up: Your physician recommends that you schedule a follow-up appointment in: three months with Dr. Fletcher Anon.     Any Other Special Instructions Will Be Listed Below (If Applicable).

## 2015-07-17 NOTE — Progress Notes (Signed)
HPI  [name] [name] is a 79 year old female who is here today for a followup visit.  She has known history of persistent atrial fibrillation/flutter status post multiple cardioversions in the past. 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 She had cardiac catheterization twice in the past without significant coronary artery disease.  Most recent cardioversion was in August of 2014. She did have bradycardia post cardioversion. She has known history of labile hypertension. Most recent Echocardiogram in September 2015 showed normal LV systolic function with moderately dilated left atrium, mild mitral regurgitation, moderate tricuspid regurgitation and moderate to severe pulmonary hypertension.  During last visit, she reported fatigue of unclear etiology. I directed her to follow-up with Dr.Tullo  regarding that. She had tick bites but Lyme disease serology was negative. Her labs were overall unremarkable except for evidence of volume depletion. Furosemide was discontinued with subsequent improvement in renal function to normal. She reports improvement in symptoms. She continues to complain of low blood pressure readings at home with dizziness. She cut down the dose of lisinopril to 10 mg once daily but this is still happening.  Allergies  Allergen Reactions  . Biaxin [Clarithromycin]   . Codeine   . Fluocinonide Other (See Comments)    Tingling sensation in head and redness to scalp.  . Iodine   . Promethazine Other (See Comments)  . Warfarin Sodium      Current Outpatient Prescriptions on File Prior to Visit  Medication Sig Dispense Refill  . amiodarone (PACERONE) 200 MG tablet Take 1 tablet (200 mg total) by mouth daily. 30 tablet 6  . amLODipine (NORVASC) 5 MG tablet Take 5 mg by mouth daily.    [name] apixaban (ELIQUIS) 5 MG TABS tablet Take 1 tablet (5 mg total) by mouth 2 (two) times daily. 60 tablet 6  . ergocalciferol (DRISDOL) 50000 UNITS capsule  Take 1 capsule (50,000 Units total) by mouth once a week. 12 capsule 0  . HYDROcodone-acetaminophen (NORCO/VICODIN) 5-325 MG per tablet Take 1 tablet by mouth every 6 (six) hours as needed for moderate pain. 90 tablet 0  . omeprazole (PRILOSEC) 20 MG capsule Take 1 capsule (20 mg total) by mouth daily. 180 capsule 1  . ondansetron (ZOFRAN-ODT) 8 MG disintegrating tablet Take 1 tablet (8 mg total) by mouth every 8 (eight) hours as needed for nausea or vomiting. 20 tablet 0  . pravastatin (PRAVACHOL) 20 MG tablet TAKE ONE (1) TABLET BY MOUTH EVERY DAY 90 tablet 1   No current facility-administered medications on file prior to visit.     Past Medical History  Diagnosis Date  . Atrial fibrillation     Paroxysmal, hx of  . Cardiomyopathy   . Dyspnea   . Headache(784.0)     chronic  . Hypertension   . Pre-syncope   . Chronic kidney disease     acute renal failure secondary to dehydration which is now resolved  . Light headedness     due to dehydration  . Atrial flutter 02/2011    s/p cardioversion   . Hyperlipidemia   . Knee fracture      Past Surgical History  Procedure Laterality Date  . Cardioversion      x 3  . Cholecystectomy    . Appendectomy    . Multiple orthopedic procedures    . Mastectomy  1986    Bilateral with silicone  breast implants, s/p saline replacements  . Cardiac catheterization    . Combined augmentation mammaplasty  and abdominoplasty    . Abdominal hysterectomy  1990  . Total knee arthroplasty Left   . Cardioversion    . Augmentation mammaplasty Bilateral 1986    implants  . Augmentation mammaplasty  1990  . Augmentation mammaplasty  2011     Family History  Problem Relation Age of Onset  . Heart disease Mother   . Cancer Father     stomach  . Cancer Sister     breast  . Breast cancer Sister   . Cancer Maternal Grandmother     breast  . Breast cancer Maternal Grandmother   . Cancer Sister     breast  . Breast cancer Sister   . Diabetes  Neg Hx   . Heart disease Son     found at autopsy  . Breast cancer Sister      History   Social History  . Marital Status: Married    Spouse Name: N/A  . Number of Children: 1  . Years of Education: N/A   Occupational History  .     Social History Main Topics  . Smoking status: Never Smoker   . Smokeless tobacco: Never Used  . Alcohol Use: No  . Drug Use: No  . Sexual Activity: No   Other Topics Concern  . Not on file   Social History Narrative     PHYSICAL EXAM   BP 110/56 mmHg  Pulse 52  Ht 5\' 4"  (1.626 m)  Wt 159 lb 8 oz (72.349 kg)  BMI 27.36 kg/m2 Constitutional: She is oriented to person, place, and time. She appears well-developed and well-nourished. No distress.  HENT: No nasal discharge.  Head: Normocephalic and atraumatic.  Eyes: Pupils are equal and round. Right eye exhibits no discharge. Left eye exhibits no discharge.  Neck: Normal range of motion. Neck supple. No JVD present. No thyromegaly present.  Cardiovascular: Normal rate, irregular rhythm, normal heart sounds. Exam reveals no gallop and no friction rub. No murmur heard.  Pulmonary/Chest: Effort normal and breath sounds normal. No stridor. No respiratory distress. She has no wheezes. She has no rales. She exhibits no tenderness.  Abdominal: Soft. Bowel sounds are normal. She exhibits no distension. There is no tenderness. There is no rebound and no guarding.  Musculoskeletal: Normal range of motion. She exhibits trace edema and no tenderness.  Neurological: She is alert and oriented to person, place, and time. Coordination normal.  Skin: Skin is warm and dry. No rash noted. She is not diaphoretic. No erythema. No pallor.  Psychiatric: She has a normal mood and affect. Her behavior is normal. Judgment and thought content normal.     EKG: Sinus  Bradycardia  -Second degree A-V block  (Type II)  -Right bundle branch block.   -  Negative T-waves  -Possible  Anterolateral  ischemia.   ABNORMAL    ASSESSMENT AND PLAN

## 2015-07-17 NOTE — Assessment & Plan Note (Signed)
She is maintaining in sinus rhythm on amiodarone. However, she is more bradycardic with evidence of second degree AV block. Thus, I decreased the dose of amiodarone to 100 mg once daily. Her labs in May were overall unremarkable including liver and thyroid function. In the past, when we decreased the dose of amiodarone she developed atrial fibrillation. That is a possibility that we have to monitor for. Continue anticoagulation.

## 2015-07-17 NOTE — Assessment & Plan Note (Signed)
Blood pressure is still running low in spite of decreasing the dose of lisinopril to 10 mg daily. I decreased the dose of amlodipine to 2.5 mg once daily.

## 2015-07-17 NOTE — Telephone Encounter (Signed)
S/w pt regarding amlodipine and amiodarone decrease at today's visit. Pt asks if we sent in new prescription. Informed pt prescription send electronically to [facility]. States at 6/23 OV with Dr. Derrel Nip, lisinopril was decreased to 10mg . She is concerned that since Dr. Fletcher Anon decreased amlodipine today, her BP may become elevated. Suggested to pt that she take medications as directed, monitor BP and if BP increased, contact us. Pt verbalized understanding with no further questions.

## 2015-07-17 NOTE — Telephone Encounter (Signed)
Pt was in this morning, and has some questions regarding her medications. Please call.

## 2015-07-22 ENCOUNTER — Telehealth: Payer: Self-pay

## 2015-07-22 NOTE — Telephone Encounter (Signed)
S/w pt who states she has still been dizzy, lightheaded, shaky even with decreased amiodarone and amlodipine. BP readings are listed below. Takes lisinopril 10mg  and amlodipine 2.5mg  in the evening. Amiodarone 100mg  in the morning. Advised pt to check BP before taking evening meds and hold if systolic <056. Unsure if she is in afib. Would like to come in tomorrow for EKG and BP check. Agreeable to Wed at 10am.

## 2015-07-22 NOTE — Telephone Encounter (Signed)
Pt called, states when she was in here last week, Dr. Fletcher Anon changed her medication due to low HR. Pt states today she is "very shaky", fatigue, dizzy.  BP 150/76 this a.m., HR 56 10 am today, 119/60, HR 49 Sunday am. BP 91/48 HR44 at 9:30, 122/54, HR 44 at 10:00  Please call.

## 2015-07-23 ENCOUNTER — Ambulatory Visit: Payer: Medicare Other

## 2015-07-23 ENCOUNTER — Telehealth: Payer: Self-pay

## 2015-07-23 VITALS — BP 136/69 | HR 49 | Resp 16

## 2015-07-23 DIAGNOSIS — I10 Essential (primary) hypertension: Secondary | ICD-10-CM

## 2015-07-23 NOTE — Telephone Encounter (Signed)
Rise Mu, PA-C at 07/23/2015 2:06 PM     Status: Signed       Expand All Collapse All   Please have her hold lisinopril at this time. She cant take amlodipine 5 mg daily. If BP starts to get on the soft side (SBP <110 then would decrease amlodipine to 2.5 mg). It appears she may feel a little better with holding of the lisinopril.        S/w pt regarding Ryan's recommendations. Pt verbalized understanding and states she will continue to keep BP log. Will CB if HR continues to be low.

## 2015-07-23 NOTE — Progress Notes (Signed)
Please have her hold lisinopril at this time. She cant take amlodipine 5 mg daily. If BP starts to get on the soft side (SBP <110 then would decrease amlodipine to 2.5 mg). It appears she may feel a little better with holding of the lisinopril.

## 2015-07-23 NOTE — Patient Instructions (Signed)
1.) Reason for visit: EKG and BP check  2.) Name of MD requesting visit:   3.) H&P: afib, HTN.   4.) ROS related to problem: At 7/28 OV, amiodarone and amlodipine decreased. Pt c/o being dizzy, weak and jittery, even more so than at her last OV. Pt provided BP log. Heart rate continues to run in upper 40s to low 50s.   She reports taking lisinopril and amlodipine in the PM, amiodarone in the AM. Says last night, she did not take lisinopril and increased amlodipine from 2.5mg  to 5mg  and reports feeling a little better this morning. When vitals taken in office  today, she had not yet taken amiodarone or BP meds.   5.) Assessment and plan per MD: EKG and BP log, along with nurse visit notes forward to Christell Faith, PA-C

## 2015-07-24 ENCOUNTER — Ambulatory Visit (INDEPENDENT_AMBULATORY_CARE_PROVIDER_SITE_OTHER): Payer: Medicare Other | Admitting: Family Medicine

## 2015-07-24 ENCOUNTER — Encounter: Payer: Self-pay | Admitting: Family Medicine

## 2015-07-24 VITALS — BP 124/62 | HR 48 | Temp 97.8°F | Ht 64.0 in | Wt 160.5 lb

## 2015-07-24 DIAGNOSIS — I10 Essential (primary) hypertension: Secondary | ICD-10-CM

## 2015-07-24 DIAGNOSIS — R079 Chest pain, unspecified: Secondary | ICD-10-CM | POA: Diagnosis not present

## 2015-07-24 DIAGNOSIS — R001 Bradycardia, unspecified: Secondary | ICD-10-CM | POA: Diagnosis not present

## 2015-07-24 DIAGNOSIS — R5383 Other fatigue: Secondary | ICD-10-CM

## 2015-07-24 DIAGNOSIS — R531 Weakness: Secondary | ICD-10-CM | POA: Diagnosis not present

## 2015-07-24 NOTE — Assessment & Plan Note (Signed)
Patient with low blood pressures at home and symptoms suggestive of hypertension. Orthostatics negative today. Decreasing Norvasc to 2.5 mg daily.

## 2015-07-24 NOTE — Assessment & Plan Note (Addendum)
Patient with genera discussed with the patient and her husband.lized weakness and associated dizziness. Etiology is unclear at this time but history suggestive of underlying cardiac issue and/or hypotension.  EKG was performed today and revealed sinus bradycardia with right bundle branch block. No evidence of heart block at this time (this was noted previously by her cardiologist). Obtaining CBC and metabolic panel today. I advised the patient to lower her dose of Norvasc. Patient to follow-up with Dr. Fletcher Anon next week.  Return precautions discussed with patient and husband.

## 2015-07-24 NOTE — Progress Notes (Addendum)
Subjective:  Patient ID: [name] [name], female    DOB: November 02, 1936  Age: 79 y.o. MRN: [id number]  CC: Dizziness/weakness  HPI 79 year old female with a PMH of HTN, Afib (on Xarelto), Pulmonary HTN presents to clinic today for acute visit with chief complaint of dizziness and weakness.  Dizziness/weakness  Patient reports that for the past month and more so recently over the past 1-2 weeks she's been experiencing significant dizziness and associated weakness.  She states that it's been recently worsening.  She states that she feels weak and as if she is going to faint.  She states it is particularly troublesome when she is going from lying to sitting to standing.  She also reports that she feels like her blood pressure is low when this occurs.  No known exacerbating or relieving factors.  Of note, patient was recently seen by her cardiologist on 7/28 and reported similar symptoms then. EKG was done and she was found to be in second degree heart block. Her amiodarone was decreased at that time. Additionally, she followed up and her blood pressure medications were further decreased.  Social Hx - Nonsmoker.   Review of Systems  Constitutional: Positive for fatigue.  Respiratory: Positive for shortness of breath.   Neurological: Positive for dizziness.    Objective:  BP 124/62 mmHg  Pulse 48  Temp(Src) 97.8 F (36.6 C) (Oral)  Ht 5\' 4"  (1.626 m)  Wt 160 lb 8 oz (72.802 kg)  BMI 27.54 kg/m2  SpO2 96%  BP/Weight 07/24/2015 07/23/2015 06/20/6377  Systolic BP 588 502 774  Diastolic BP 62 69 56  Wt. (Lbs) 160.5 - 159.5  BMI 27.54 - 27.36     Physical Exam  Constitutional: She is oriented to person, place, and time. She appears well-developed and well-nourished. No distress.  Cardiovascular: Regular rhythm.  Bradycardia present.   No murmur heard. Pulmonary/Chest: Effort normal and breath sounds normal. She has no wheezes. She has no rales.  Abdominal: Soft. She exhibits no  distension. There is no tenderness.  Musculoskeletal:  1+ LE edema bilaterally.   Neurological: She is alert and oriented to person, place, and time. No cranial nerve deficit.  Skin: Skin is warm and dry. No rash noted.  Psychiatric: She has a normal mood and affect.    Lab Results  Component Value Date   WBC 5.7 04/22/2015   HGB 12.9 04/22/2015   HCT 38.6 04/22/2015   PLT 234.0 04/22/2015   GLUCOSE 89 05/01/2015   CHOL 174 04/22/2015   TRIG 82.0 04/22/2015   HDL 61.00 04/22/2015   LDLCALC 97 04/22/2015   ALT 14 04/22/2015   AST 15 04/22/2015   NA 136 05/01/2015   K 4.1 05/01/2015   CL 103 05/01/2015   CREATININE 0.99 05/01/2015   BUN 18 05/01/2015   CO2 29 05/01/2015   TSH 2.03 04/22/2015   INR 1.6 11/05/2013   EKG - Sinus bradycardia with RBBB.   Assessment & Plan:   Problem List Items Addressed This Visit    Essential hypertension    Patient with low blood pressures at home and symptoms suggestive of hypertension. Orthostatics negative today. Decreasing Norvasc to 2.5 mg daily.      Relevant Orders   BASIC METABOLIC PANEL WITH GFR   Generalized weakness - Primary    Patient with genera discussed with the patient and her husband.lized weakness and associated dizziness. Etiology is unclear at this time but history suggestive of underlying cardiac issue and/or hypotension.  EKG was performed today and revealed sinus bradycardia with right bundle branch block. No evidence of heart block at this time (this was noted previously by her cardiologist). Obtaining CBC and metabolic panel today. I advised the patient to lower her dose of Norvasc. Patient to follow-up with Dr. Fletcher Anon next week.  Return precautions discussed with patient and husband.       Relevant Orders   CBC    Other Visit Diagnoses    Bradycardia        Relevant Orders    EKG 12-Lead (Completed)       Follow-up: Return if symptoms worsen or fail to improve.; Patient to follow up with Dr. Derrel Nip  as indicated prior.    Thersa Salt, DO

## 2015-07-24 NOTE — Patient Instructions (Signed)
It was nice to see you today.  Your fatigue/weakness is likely coming from you heart and low blood pressure.  Please decrease your Norvasc to 2.5 mg daily.  Be sure to follow up with Dr. Fletcher Anon (appt is thurs at 915 am).  Please take it easy until you see Dr. Fletcher Anon.  Take care  Dr. Lacinda Axon

## 2015-07-24 NOTE — Progress Notes (Signed)
Pre visit review using our clinic review tool, if applicable. No additional management support is needed unless otherwise documented below in the visit note. 

## 2015-07-25 LAB — BASIC METABOLIC PANEL WITH GFR
BUN: 18 mg/dL (ref 7–25)
CO2: 23 mmol/L (ref 20–31)
Calcium: 9.1 mg/dL (ref 8.6–10.4)
Chloride: 103 mmol/L (ref 98–110)
Creat: 1.06 mg/dL — ABNORMAL HIGH (ref 0.60–0.93)
GFR, Est African American: 59 mL/min — ABNORMAL LOW (ref >=60)
GFR, Est Non African American: 51 mL/min — ABNORMAL LOW (ref >=60)
Glucose, Bld: 92 mg/dL (ref 65–99)
Potassium: 4.7 mmol/L (ref 3.5–5.3)
Sodium: 138 mmol/L (ref 135–146)

## 2015-07-28 ENCOUNTER — Telehealth: Payer: Self-pay | Admitting: *Deleted

## 2015-07-28 NOTE — Telephone Encounter (Signed)
I received a call from [facility] lab late Friday afternoon. I was informed that patient needs to have her CBC recollected. Only one purple tube was drawn (needed two). Please advise if you want her to come back this week or hold off.

## 2015-07-28 NOTE — Telephone Encounter (Signed)
If patient desires she can have this redrawn.  I do not feel like she has to have this (unless she is concerned) as her CBC was normal on 04/22/15.

## 2015-07-30 NOTE — Telephone Encounter (Signed)
Pt states that she will hold off on repeating the CBC for now & copy of lab results mailed to patient at her request.

## 2015-07-31 ENCOUNTER — Ambulatory Visit (INDEPENDENT_AMBULATORY_CARE_PROVIDER_SITE_OTHER): Payer: Medicare Other | Admitting: Cardiovascular Disease

## 2015-07-31 ENCOUNTER — Ambulatory Visit (INDEPENDENT_AMBULATORY_CARE_PROVIDER_SITE_OTHER): Payer: Medicare Other

## 2015-07-31 ENCOUNTER — Encounter: Payer: Self-pay | Admitting: Cardiovascular Disease

## 2015-07-31 VITALS — BP 130/60 | HR 50 | Ht 64.5 in | Wt 160.2 lb

## 2015-07-31 DIAGNOSIS — I10 Essential (primary) hypertension: Secondary | ICD-10-CM

## 2015-07-31 DIAGNOSIS — R531 Weakness: Secondary | ICD-10-CM | POA: Diagnosis not present

## 2015-07-31 DIAGNOSIS — R42 Dizziness and giddiness: Secondary | ICD-10-CM | POA: Diagnosis not present

## 2015-07-31 DIAGNOSIS — I495 Sick sinus syndrome: Secondary | ICD-10-CM

## 2015-07-31 DIAGNOSIS — R000 Tachycardia, unspecified: Secondary | ICD-10-CM

## 2015-07-31 DIAGNOSIS — I43 Cardiomyopathy in diseases classified elsewhere: Secondary | ICD-10-CM

## 2015-07-31 DIAGNOSIS — I4819 Other persistent atrial fibrillation: Secondary | ICD-10-CM

## 2015-07-31 DIAGNOSIS — I481 Persistent atrial fibrillation: Secondary | ICD-10-CM | POA: Diagnosis not present

## 2015-07-31 NOTE — Assessment & Plan Note (Signed)
She is maintaining in sinus rhythm on small dose amiodarone. She did have recent Mobitz 2 second degree AV block but that's presently not seen on EKG. It's really not entirely clear if her fatigue is related to his bradycardia. I requested a 48-hour Holter monitor for evaluation. Continue same medications for now.

## 2015-07-31 NOTE — Progress Notes (Signed)
HPI  [name] [name] is a 79 year old female who is here today for a followup visit.  She has known history of persistent atrial fibrillation/flutter status post multiple cardioversions in the past. 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 She had cardiac catheterization twice in the past without significant coronary artery disease.  Most recent cardioversion was in August of 2014. She did have bradycardia post cardioversion. She has known history of labile hypertension. Most recent Echocardiogram in September 2015 showed normal LV systolic function with moderately dilated left atrium, mild mitral regurgitation, moderate tricuspid regurgitation and moderate to severe pulmonary hypertension.  During last visit, she was complaining of fatigue. Blood pressure was noted to be still low. EKG also showed intermittent Mobitz 2 second degree AV block. I decreased the dose of amiodarone to 100 mg once daily and the dose of amlodipine to 2.5 mg once daily. The patient was referred back by her primary care physician for evaluation of persistent fatigue. I reviewed her home blood pressure readings in heart rate. The heart rate is mostly staying in the 50s and 60s. Blood pressure seems to be now controlled with no episodes of hypotension. She reports gradual improvement in symptoms although she is not back to her normal self.   Allergies  Allergen Reactions  . Biaxin [Clarithromycin]   . Codeine   . Fluocinonide Other (See Comments)    Tingling sensation in head and redness to scalp.  . Iodine   . Promethazine Other (See Comments)  . Warfarin Sodium      Current Outpatient Prescriptions on File Prior to Visit  Medication Sig Dispense Refill  . amiodarone (PACERONE) 100 MG tablet Take 1 tablet (100 mg total) by mouth daily. 30 tablet 5  . amLODipine (NORVASC) 2.5 MG tablet Take 1 tablet (2.5 mg total) by mouth daily. 30 tablet 11  . apixaban (ELIQUIS) 5 MG TABS  tablet Take 1 tablet (5 mg total) by mouth 2 (two) times daily. 60 tablet 6  . ergocalciferol (DRISDOL) 50000 UNITS capsule Take 1 capsule (50,000 Units total) by mouth once a week. 12 capsule 0  . omeprazole (PRILOSEC) 20 MG capsule Take 1 capsule (20 mg total) by mouth daily. 180 capsule 1  . pravastatin (PRAVACHOL) 20 MG tablet TAKE ONE (1) TABLET BY MOUTH EVERY DAY 90 tablet 1   No current facility-administered medications on file prior to visit.     Past Medical History  Diagnosis Date  . Atrial fibrillation     Paroxysmal, hx of  . Cardiomyopathy   . Dyspnea   . Headache(784.0)     chronic  . Hypertension   . Pre-syncope   . Chronic kidney disease     acute renal failure secondary to dehydration which is now resolved  . Light headedness     due to dehydration  . Atrial flutter 02/2011    s/p cardioversion   . Hyperlipidemia   . Knee fracture      Past Surgical History  Procedure Laterality Date  . Cardioversion      x 3  . Cholecystectomy    . Appendectomy    . Multiple orthopedic procedures    . Mastectomy  1986    Bilateral with silicone  breast implants, s/p saline replacements  . Cardiac catheterization    . Combined augmentation mammaplasty and abdominoplasty    . Abdominal hysterectomy  1990  . Total knee arthroplasty Left   . Cardioversion    . Augmentation  mammaplasty Bilateral 1986    implants  . Augmentation mammaplasty  1990  . Augmentation mammaplasty  2011     Family History  Problem Relation Age of Onset  . Heart disease Mother   . Cancer Father     stomach  . Cancer Sister     breast  . Breast cancer Sister   . Cancer Maternal Grandmother     breast  . Breast cancer Maternal Grandmother   . Cancer Sister     breast  . Breast cancer Sister   . Diabetes Neg Hx   . Heart disease Son     found at autopsy  . Breast cancer Sister      Social History   Social History  . Marital Status: Married    Spouse Name: N/A  . Number of  Children: 1  . Years of Education: N/A   Occupational History  .     Social History Main Topics  . Smoking status: Never Smoker   . Smokeless tobacco: Never Used  . Alcohol Use: No  . Drug Use: No  . Sexual Activity: No   Other Topics Concern  . Not on file   Social History Narrative     PHYSICAL EXAM   BP 130/60 mmHg  Pulse 50  Ht 5' 4.5" (1.638 m)  Wt 160 lb 4 oz (72.689 kg)  BMI 27.09 kg/m2 Constitutional: She is oriented to person, place, and time. She appears well-developed and well-nourished. No distress.  HENT: No nasal discharge.  Head: Normocephalic and atraumatic.  Eyes: Pupils are equal and round. Right eye exhibits no discharge. Left eye exhibits no discharge.  Neck: Normal range of motion. Neck supple. No JVD present. No thyromegaly present.  Cardiovascular: Normal rate, irregular rhythm, normal heart sounds. Exam reveals no gallop and no friction rub. No murmur heard.  Pulmonary/Chest: Effort normal and breath sounds normal. No stridor. No respiratory distress. She has no wheezes. She has no rales. She exhibits no tenderness.  Abdominal: Soft. Bowel sounds are normal. She exhibits no distension. There is no tenderness. There is no rebound and no guarding.  Musculoskeletal: Normal range of motion. She exhibits trace edema and no tenderness.  Neurological: She is alert and oriented to person, place, and time. Coordination normal.  Skin: Skin is warm and dry. No rash noted. She is not diaphoretic. No erythema. No pallor.  Psychiatric: She has a normal mood and affect. Her behavior is normal. Judgment and thought content normal.     EKG: Sinus  Bradycardia  -Right bundle branch block.   -  Negative T-waves  -Possible  Anterolateral  ischemia.   ABNORMAL    ASSESSMENT AND PLAN

## 2015-07-31 NOTE — Assessment & Plan Note (Signed)
Blood pressure seems to be well-controlled in the office and at home with no episodes of hypotension. Continue same medications.

## 2015-07-31 NOTE — Assessment & Plan Note (Signed)
Most recent echo showed normal LV systolic function.

## 2015-07-31 NOTE — Patient Instructions (Signed)
Medication Instructions:  Your physician recommends that you continue on your current medications as directed. Please refer to the Current Medication list given to you today.   Labwork: none  Testing/Procedures: Your physician has recommended that you wear a holter monitor. Holter monitors are medical devices that record the heart's electrical activity. Doctors most often use these monitors to diagnose arrhythmias. Arrhythmias are problems with the speed or rhythm of the heartbeat. The monitor is a small, portable device. You can wear one while you do your normal daily activities. This is usually used to diagnose what is causing palpitations/syncope (passing out).    Follow-Up: Your physician recommends that you schedule a follow-up appointment in: two months with Dr. Fletcher Anon.    Any Other Special Instructions Will Be Listed Below (If Applicable).  Cardiac Event Monitoring A cardiac event monitor is a small recording device used to help detect abnormal heart rhythms (arrhythmias). The monitor is used to record heart rhythm when noticeable symptoms such as the following occur:  Fast heartbeats (palpitations), such as heart racing or fluttering.  Dizziness.  Fainting or light-headedness.  Unexplained weakness. The monitor is wired to two electrodes placed on your chest. Electrodes are flat, sticky disks that attach to your skin. The monitor can be worn for up to 30 days. You will wear the monitor at all times, except when bathing.  HOW TO USE YOUR CARDIAC EVENT MONITOR A technician will prepare your chest for the electrode placement. The technician will show you how to place the electrodes, how to work the monitor, and how to replace the batteries. Take time to practice using the monitor before you leave the office. Make sure you understand how to send the information from the monitor to your health care provider. This requires a telephone with a landline, not a cell phone. You need  to:  Wear your monitor at all times, except when you are in water:  Do not get the monitor wet.  Take the monitor off when bathing. Do not swim or use a hot tub with it on.  Keep your skin clean. Do not put body lotion or moisturizer on your chest.  Change the electrodes daily or any time they stop sticking to your skin. You might need to use tape to keep them on.  It is possible that your skin under the electrodes could become irritated. To keep this from happening, try to put the electrodes in slightly different places on your chest. However, they must remain in the area under your left breast and in the upper right section of your chest.  Make sure the monitor is safely clipped to your clothing or in a location close to your body that your health care provider recommends.  Press the button to record when you feel symptoms of heart trouble, such as dizziness, weakness, light-headedness, palpitations, thumping, shortness of breath, unexplained weakness, or a fluttering or racing heart. The monitor is always on and records what happened slightly before you pressed the button, so do not worry about being too late to get good information.  Keep a diary of your activities, such as walking, doing chores, and taking medicine. It is especially important to note what you were doing when you pushed the button to record your symptoms. This will help your health care provider determine what might be contributing to your symptoms. The information stored in your monitor will be reviewed by your health care provider alongside your diary entries.  Send the recorded information as recommended  by your health care provider. It is important to understand that it will take some time for your health care provider to process the results.  Change the batteries as recommended by your health care provider. SEEK IMMEDIATE MEDICAL CARE IF:   You have chest pain.  You have extreme difficulty breathing or shortness  of breath.  You develop a very fast heartbeat that persists.  You develop dizziness that does not go away.  You faint or constantly feel you are about to faint. Document Released: 09/14/2008 Document Revised: 04/22/2014 Document Reviewed: 06/04/2013 [facility] Patient Information 2015 [place], [place]. This information is not intended to replace advice given to you by your health care provider. Make sure you discuss any questions you have with your health care provider.

## 2015-08-02 ENCOUNTER — Telehealth: Payer: Self-pay | Admitting: Cardiology

## 2015-08-02 NOTE — Telephone Encounter (Signed)
Pt called about her monitor.  Kerin Ransom PA-C 08/02/2015 11:07 AM

## 2015-08-28 ENCOUNTER — Other Ambulatory Visit: Payer: Self-pay

## 2015-08-28 ENCOUNTER — Telehealth: Payer: Self-pay

## 2015-08-28 MED ORDER — AMLODIPINE BESYLATE 2.5 MG PO TABS: 5.0000 mg | ORAL_TABLET | Freq: Every day | ORAL | Status: DC

## 2015-08-28 NOTE — Telephone Encounter (Signed)
S/w pt with recommendations. Pt verbalized understanding Refill submitted to pharmacy

## 2015-08-28 NOTE — Telephone Encounter (Signed)
Increase Amlodipine back to 5 mg daily.

## 2015-08-28 NOTE — Telephone Encounter (Signed)
Pt states she is not taking anything at night for her BP.  States her BP is going up (like 142). Wants to know if she can take a Lisinopril at night.  States yesterday morning her BP was 152. Please Call.

## 2015-08-28 NOTE — Telephone Encounter (Signed)
S/w pt who reports BP of 334 and 356 systolic for several days. At 8/3 BP check/nurse visit, pt instructed to hold lisinopril Pt amlodipine decreased to 2.5mg  at 8/4 OV with Dr. Lacinda Axon Pt asks if she can increase back to 5mg  qd or 2.5mg  BID Forward to [place].

## 2015-09-12 ENCOUNTER — Ambulatory Visit: Payer: Medicare Other | Admitting: Family Medicine

## 2015-09-12 ENCOUNTER — Ambulatory Visit (INDEPENDENT_AMBULATORY_CARE_PROVIDER_SITE_OTHER): Payer: Medicare Other | Admitting: Nurse Practitioner

## 2015-09-12 ENCOUNTER — Encounter: Payer: Self-pay | Admitting: Nurse Practitioner

## 2015-09-12 VITALS — BP 128/52 | HR 54 | Temp 97.9°F | Resp 14 | Ht 64.5 in | Wt 159.6 lb

## 2015-09-12 DIAGNOSIS — R300 Dysuria: Secondary | ICD-10-CM | POA: Diagnosis not present

## 2015-09-12 DIAGNOSIS — N760 Acute vaginitis: Secondary | ICD-10-CM | POA: Diagnosis not present

## 2015-09-12 DIAGNOSIS — E559 Vitamin D deficiency, unspecified: Secondary | ICD-10-CM | POA: Diagnosis not present

## 2015-09-12 LAB — POCT URINALYSIS DIPSTICK
Bilirubin, UA: NEGATIVE
Blood, UA: NEGATIVE
Glucose, UA: NEGATIVE
Ketones, UA: NEGATIVE
Nitrite, UA: NEGATIVE
Protein, UA: NEGATIVE
Spec Grav, UA: 1.02
Urobilinogen, UA: 1
pH, UA: 6

## 2015-09-12 NOTE — Assessment & Plan Note (Signed)
Worsening. Asked pt to decrease tea with artificial sweeteners. POCT urine not convincing for UTI. Will obtain culture. Await results for treatment.

## 2015-09-12 NOTE — Progress Notes (Signed)
Pre visit review using our clinic review tool, if applicable. No additional management support is needed unless otherwise documented below in the visit note. 

## 2015-09-12 NOTE — Progress Notes (Signed)
Patient ID: [name] [name], female    DOB: 1936/11/10  Age: 79 y.o. MRN: [id number]  CC: Urinary Tract Infection   HPI [name] [name] presents for CC UTI symptoms and burning with discharge x 1 month.   1) Dysuria- intermittent, this week worsening after urinating. Drinking tea again (apparently this was an issue in the past), states it is decaff, but has artificial sweeteners.  2) Discharge, pruritis, and lower abdominal pain x 1 month. Progressively worsening. Does have small amount from clear to white. Denies odor or bloody discharge. Denies using irritating soaps or body washes.   History [name] [name] has a past medical history of Atrial fibrillation; Cardiomyopathy; Dyspnea; Headache(784.0); Hypertension; Pre-syncope; Chronic kidney disease; Light headedness; Atrial flutter (02/2011); Hyperlipidemia; and Knee fracture.   She has past surgical history that includes Cardioversion; Cholecystectomy; Appendectomy; Multiple orthopedic procedures; Mastectomy (1986); Cardiac catheterization; Combined augmentation mammaplasty and abdominoplasty; Abdominal hysterectomy (1990); Total knee arthroplasty (Left); Cardioversion; Augmentation mammaplasty (Bilateral, 1986); Augmentation mammaplasty (1990); and Augmentation mammaplasty (2011).   Her family history includes Breast cancer in her maternal grandmother, sister, sister, and sister; Cancer in her father, maternal grandmother, sister, and sister; Heart disease in her mother and son. There is no history of Diabetes.She reports that she has never smoked. She has never used smokeless tobacco. She reports that she does not drink alcohol or use illicit drugs.  Outpatient Prescriptions Prior to Visit  Medication Sig Dispense Refill  . amiodarone (PACERONE) 100 MG tablet Take 1 tablet (100 mg total) by mouth daily. 30 tablet 5  . amLODipine (NORVASC) 2.5 MG tablet Take 2 tablets (5 mg total) by mouth daily. 60 tablet 3  . apixaban (ELIQUIS) 5 MG TABS tablet  Take 1 tablet (5 mg total) by mouth 2 (two) times daily. 60 tablet 6  . ergocalciferol (DRISDOL) 50000 UNITS capsule Take 1 capsule (50,000 Units total) by mouth once a week. 12 capsule 0  . omeprazole (PRILOSEC) 20 MG capsule Take 1 capsule (20 mg total) by mouth daily. 180 capsule 1  . pravastatin (PRAVACHOL) 20 MG tablet TAKE ONE (1) TABLET BY MOUTH EVERY DAY 90 tablet 1   No facility-administered medications prior to visit.    ROS Review of Systems  Constitutional: Negative for fever, chills, diaphoresis and fatigue.  Respiratory: Negative for chest tightness, shortness of breath and wheezing.   Gastrointestinal: Negative for nausea, vomiting and diarrhea.  Genitourinary: Positive for dysuria, urgency, frequency and vaginal discharge. Negative for hematuria, flank pain, vaginal bleeding and vaginal pain.  Skin: Negative for rash.  Neurological: Negative for dizziness, weakness, numbness and headaches.  Psychiatric/Behavioral: The patient is not nervous/anxious.     Objective:  BP 128/52 mmHg  Pulse 54  Temp(Src) 97.9 F (36.6 C)  Resp 14  Ht 5' 4.5" (1.638 m)  Wt 159 lb 9.6 oz (72.394 kg)  BMI 26.98 kg/m2  SpO2 98%  Physical Exam  Constitutional: She is oriented to person, place, and time. She appears well-developed and well-nourished. No distress.  HENT:  Head: Normocephalic and atraumatic.  Right Ear: External ear normal.  Left Ear: External ear normal.  Abdominal: There is no CVA tenderness.  Genitourinary: No vaginal discharge found.  No vaginal discharge visualized and no abnormalities visualized externally. Small protrusion of bladder.   Neurological: She is alert and oriented to person, place, and time.  Skin: Skin is warm and dry. No rash noted. She is not diaphoretic.  Psychiatric: She has a normal mood and affect. Her behavior is normal.  Judgment and thought content normal.   Assessment & Plan:   Del was seen today for urinary tract  infection.  Diagnoses and all orders for this visit:  Dysuria -     POCT Urinalysis Dipstick -     Urine Culture  Vaginitis and vulvovaginitis -     WET PREP BY MOLECULAR PROBE  Vitamin D deficiency -     Vitamin D (25 hydroxy); Future   I am having [name] [name] maintain her apixaban, pravastatin, ergocalciferol, omeprazole, amiodarone, and amLODipine.  No orders of the defined types were placed in this encounter.    Follow-up: Return in about 4 weeks (around 10/10/2015) for Lab visit for Vitamin D.

## 2015-09-12 NOTE — Assessment & Plan Note (Signed)
Vitamin D lab ordered for 1 month. Pt finished with 50,000 units and will start Vitamin D 2,000 units daily.

## 2015-09-12 NOTE — Patient Instructions (Signed)
Dr. Derrel Nip says vitamin D (over the counter) 2,000 units daily.   We will let you know results of the culture and vaginal swab.

## 2015-09-12 NOTE — Assessment & Plan Note (Signed)
Worsening. Wet prep completed. Will follow.

## 2015-09-13 LAB — WET PREP BY MOLECULAR PROBE
Candida species: NEGATIVE
Gardnerella vaginalis: NEGATIVE
Trichomonas vaginosis: NEGATIVE

## 2015-09-15 LAB — URINE CULTURE: Colony Count: 75000

## 2015-09-16 ENCOUNTER — Other Ambulatory Visit: Payer: Self-pay | Admitting: Nurse Practitioner

## 2015-09-16 MED ORDER — CEPHALEXIN 500 MG PO CAPS: 500.0000 mg | ORAL_CAPSULE | Freq: Two times a day (BID) | ORAL | Status: DC

## 2015-09-16 MED ORDER — NITROFURANTOIN MONOHYD MACRO 100 MG PO CAPS: 100.0000 mg | ORAL_CAPSULE | Freq: Two times a day (BID) | ORAL | Status: DC

## 2015-09-29 ENCOUNTER — Telehealth: Payer: Self-pay | Admitting: *Deleted

## 2015-09-29 NOTE — Telephone Encounter (Signed)
Patient calling the office for samples of medication:   1.  What medication and dosage are you requesting samples for? Eliquis 5 mg   2.  Are you currently out of this medication? Has about 5 pills left.   3. Are you requesting samples to get you through until a mail order prescription arrives? No she cant' afford it.

## 2015-09-29 NOTE — Telephone Encounter (Signed)
Placed samples at front desk for pick up. Pt is aware that we do not have 5 mg samples tablet.  She is aware to take 2 tablets 2.5 mg Eliquis bid.

## 2015-10-01 DIAGNOSIS — L728 Other follicular cysts of the skin and subcutaneous tissue: Secondary | ICD-10-CM | POA: Diagnosis not present

## 2015-10-01 DIAGNOSIS — L918 Other hypertrophic disorders of the skin: Secondary | ICD-10-CM | POA: Diagnosis not present

## 2015-10-01 DIAGNOSIS — L821 Other seborrheic keratosis: Secondary | ICD-10-CM | POA: Diagnosis not present

## 2015-10-10 ENCOUNTER — Other Ambulatory Visit (INDEPENDENT_AMBULATORY_CARE_PROVIDER_SITE_OTHER): Payer: Medicare Other

## 2015-10-10 ENCOUNTER — Telehealth: Payer: Self-pay | Admitting: *Deleted

## 2015-10-10 DIAGNOSIS — E559 Vitamin D deficiency, unspecified: Secondary | ICD-10-CM | POA: Diagnosis not present

## 2015-10-10 DIAGNOSIS — R7301 Impaired fasting glucose: Secondary | ICD-10-CM

## 2015-10-10 DIAGNOSIS — R5381 Other malaise: Secondary | ICD-10-CM | POA: Diagnosis not present

## 2015-10-10 LAB — CBC WITH DIFFERENTIAL/PLATELET
Basophils Absolute: 0 10*3/uL (ref 0.0–0.1)
Basophils Relative: 0.5 % (ref 0.0–3.0)
Eosinophils Absolute: 0.1 10*3/uL (ref 0.0–0.7)
Eosinophils Relative: 0.8 % (ref 0.0–5.0)
HCT: 40.5 % (ref 36.0–46.0)
Hemoglobin: 13.1 g/dL (ref 12.0–15.0)
Lymphocytes Relative: 21.7 % (ref 12.0–46.0)
Lymphs Abs: 1.5 10*3/uL (ref 0.7–4.0)
MCHC: 32.4 g/dL (ref 30.0–36.0)
MCV: 95.4 fl (ref 78.0–100.0)
Monocytes Absolute: 0.3 10*3/uL (ref 0.1–1.0)
Monocytes Relative: 4.2 % (ref 3.0–12.0)
Neutro Abs: 5 10*3/uL (ref 1.4–7.7)
Neutrophils Relative %: 72.8 % (ref 43.0–77.0)
Platelets: 226 10*3/uL (ref 150.0–400.0)
RBC: 4.25 Mil/uL (ref 3.87–5.11)
RDW: 13.9 % (ref 11.5–15.5)
WBC: 6.9 10*3/uL (ref 4.0–10.5)

## 2015-10-10 LAB — COMPREHENSIVE METABOLIC PANEL
ALT: 17 U/L (ref 0–35)
AST: 17 U/L (ref 0–37)
Albumin: 3.9 g/dL (ref 3.5–5.2)
Alkaline Phosphatase: 58 U/L (ref 39–117)
BUN: 20 mg/dL (ref 6–23)
CO2: 25 mEq/L (ref 19–32)
Calcium: 9.3 mg/dL (ref 8.4–10.5)
Chloride: 105 mEq/L (ref 96–112)
Creatinine, Ser: 0.98 mg/dL (ref 0.40–1.20)
GFR: 58.38 mL/min — ABNORMAL LOW (ref >60.00)
Glucose, Bld: 172 mg/dL — ABNORMAL HIGH (ref 70–99)
Potassium: 4.2 mEq/L (ref 3.5–5.1)
Sodium: 138 mEq/L (ref 135–145)
Total Bilirubin: 0.7 mg/dL (ref 0.2–1.2)
Total Protein: 7 g/dL (ref 6.0–8.3)

## 2015-10-10 LAB — VITAMIN D 25 HYDROXY (VIT D DEFICIENCY, FRACTURES): VITD: 31.65 ng/mL (ref 30.00–100.00)

## 2015-10-10 NOTE — Telephone Encounter (Signed)
YES, LABS CAN BE DONE TODAY IF SHE PREFERS.

## 2015-10-10 NOTE — Telephone Encounter (Signed)
Please advise on Monday there is one work in spot at 5 PM ok to use? And get labs that morning?

## 2015-10-10 NOTE — Telephone Encounter (Signed)
Patient requested to be seen by Dr. Derrel Nip this following week. Patient stated that she Wasn't feeling well, and was wanting blood work ordered for her kidneys. Please advise where to place patient on the schedule.

## 2015-10-10 NOTE — Telephone Encounter (Signed)
Patient scheduled for lab and appointment for Monday.

## 2015-10-12 NOTE — Addendum Note (Signed)
Addended by: [name] on: 10/12/2015 09:31 PM   Modules accepted: Orders

## 2015-10-13 ENCOUNTER — Encounter: Payer: Self-pay | Admitting: Internal Medicine

## 2015-10-13 ENCOUNTER — Ambulatory Visit (INDEPENDENT_AMBULATORY_CARE_PROVIDER_SITE_OTHER): Payer: Medicare Other | Admitting: Internal Medicine

## 2015-10-13 VITALS — BP 140/78 | HR 54 | Temp 97.9°F | Resp 12 | Ht 64.5 in | Wt 158.0 lb

## 2015-10-13 DIAGNOSIS — G8929 Other chronic pain: Secondary | ICD-10-CM | POA: Diagnosis not present

## 2015-10-13 DIAGNOSIS — R1031 Right lower quadrant pain: Secondary | ICD-10-CM

## 2015-10-13 DIAGNOSIS — K4090 Unilateral inguinal hernia, without obstruction or gangrene, not specified as recurrent: Secondary | ICD-10-CM | POA: Insufficient documentation

## 2015-10-13 DIAGNOSIS — R399 Unspecified symptoms and signs involving the genitourinary system: Secondary | ICD-10-CM | POA: Diagnosis not present

## 2015-10-13 LAB — POCT URINALYSIS DIPSTICK
Bilirubin, UA: NEGATIVE
Glucose, UA: NEGATIVE
Ketones, UA: NEGATIVE
Nitrite, UA: NEGATIVE
Protein, UA: NEGATIVE
Spec Grav, UA: <=1.005
Urobilinogen, UA: 0.2
pH, UA: 5

## 2015-10-13 NOTE — Patient Instructions (Signed)
I am ordering a CT of your abdomen and pelvis before I sen you to Dr Tamala Julian for evaluation.      I .  I advise you to try switching from daily omeprazole to  either famotidine 20 mg or   ranitidine 150 mg twice daily.  These medications are  H2 blockers and are available without a prescriptions.   if your reflux symptoms are controlled,  You can Continue the daily h2 blocker.  If not we will resume omeprazole twice daily   .   Regards,  Dr. Derrel Nip +-

## 2015-10-13 NOTE — Progress Notes (Signed)
Subjective:  Patient ID: [name] [name], female    DOB: Jan 18, 1936  Age: 79 y.o. MRN: [id number]  CC: The primary encounter diagnosis was UTI symptoms. Diagnoses of Chronic RLQ pain and Chronic suprapubic pain, right were also pertinent to this visit.  HPI [name] [name] presents for rlq pain for the last several months,  Aggravated since she has  been doing a lot of outside yardwork  Very sore in right lower abdomen  Aggravated by lifiting anything  Tried heating pad which helps but the pain is waking her up at night . Bowels  moving normally, no fevers   History of right inguinal hernia repair and appy in early 20's.   Pelivc exam late September by [facility] for multiple urogyn symptoms .  Wet prep neg,  Treated for UTI with keflex for klebsiella and e coli sept 25th .    Outpatient Prescriptions Prior to Visit  Medication Sig Dispense Refill  . amiodarone (PACERONE) 100 MG tablet Take 1 tablet (100 mg total) by mouth daily. 30 tablet 5  . amLODipine (NORVASC) 2.5 MG tablet Take 2 tablets (5 mg total) by mouth daily. 60 tablet 3  . apixaban (ELIQUIS) 5 MG TABS tablet Take 1 tablet (5 mg total) by mouth 2 (two) times daily. 60 tablet 6  . omeprazole (PRILOSEC) 20 MG capsule Take 1 capsule (20 mg total) by mouth daily. 180 capsule 1  . ergocalciferol (DRISDOL) 50000 UNITS capsule Take 1 capsule (50,000 Units total) by mouth once a week. 12 capsule 0  . pravastatin (PRAVACHOL) 20 MG tablet TAKE ONE (1) TABLET BY MOUTH EVERY DAY 90 tablet 1  . cephALEXin (KEFLEX) 500 MG capsule Take 1 capsule (500 mg total) by mouth 2 (two) times daily. (Patient not taking: Reported on 10/13/2015) 14 capsule 0   No facility-administered medications prior to visit.    Review of Systems;  Patient denies headache, fevers, malaise, unintentional weight loss, skin rash, eye pain, sinus congestion and sinus pain, sore throat, dysphagia,  hemoptysis , cough, dyspnea, wheezing, chest pain, palpitations, orthopnea,  edema, abdominal pain, nausea, melena, diarrhea, constipation, flank pain, dysuria, hematuria, urinary  Frequency, nocturia, numbness, tingling, seizures,  Focal weakness, Loss of consciousness,  Tremor, insomnia, depression, anxiety, and suicidal ideation.      Objective:  BP 140/78 mmHg  Pulse 54  Temp(Src) 97.9 F (36.6 C) (Oral)  Resp 12  Ht 5' 4.5" (1.638 m)  Wt 158 lb (71.668 kg)  BMI 26.71 kg/m2  SpO2 99%  BP Readings from Last 3 Encounters:  10/13/15 140/78  09/12/15 128/52  07/31/15 130/60    Wt Readings from Last 3 Encounters:  10/13/15 158 lb (71.668 kg)  09/12/15 159 lb 9.6 oz (72.394 kg)  07/31/15 160 lb 4 oz (72.689 kg)    General appearance: alert, cooperative and appears stated age Ears: normal TM's and external ear canals both ears Throat: lips, mucosa, and tongue normal; teeth and gums normal Neck: no adenopathy, no carotid bruit, supple, symmetrical, trachea midline and thyroid not enlarged, symmetric, no tenderness/mass/nodules Back: symmetric, no curvature. ROM normal. No CVA tenderness. Lungs: clear to auscultation bilaterally Heart: regular rate and rhythm, S1, S2 normal, no murmur, click, rub or gallop Abdomen: soft, non-tender; bowel sounds normal; no masses,  no organomegaly Pulses: 2+ and symmetric Skin: Skin color, texture, turgor normal. No rashes or lesions Lymph nodes: Cervical, supraclavicular, and axillary nodes normal.  Lab Results  Component Value Date   HGBA1C 5.9 10/14/2015   HGBA1C  6.3 01/11/2015    Lab Results  Component Value Date   CREATININE 0.98 10/10/2015   CREATININE 1.06* 07/24/2015   CREATININE 0.99 05/01/2015    Lab Results  Component Value Date   WBC 6.9 10/10/2015   HGB 13.1 10/10/2015   HCT 40.5 10/10/2015   PLT 226.0 10/10/2015   GLUCOSE 172* 10/10/2015   CHOL 174 04/22/2015   TRIG 82.0 04/22/2015   HDL 61.00 04/22/2015   LDLCALC 97 04/22/2015   ALT 17 10/10/2015   AST 17 10/10/2015   NA 138  10/10/2015   K 4.2 10/10/2015   CL 105 10/10/2015   CREATININE 0.98 10/10/2015   BUN 20 10/10/2015   CO2 25 10/10/2015   TSH 2.03 04/22/2015   INR 1.6 11/05/2013   HGBA1C 5.9 10/14/2015    Mm Digital Screening W/ Implants Bilateral  05/16/2015  CLINICAL DATA:  Screening. EXAM: DIGITAL SCREENING BILATERAL MAMMOGRAM WITH IMPLANTS AND CAD The patient has retropectoral implants. Standard and implant displaced views were performed. COMPARISON:  Previous exam(s). ACR Breast Density Category b: There are scattered areas of fibroglandular density. FINDINGS: There are no findings suspicious for malignancy. Images were processed with CAD. IMPRESSION: No mammographic evidence of malignancy. A result letter of this screening mammogram will be mailed directly to the patient. RECOMMENDATION: Screening mammogram in one year. (Code:SM-B-01Y) BI-RADS CATEGORY  1:  Negative. Electronically Signed   By: Nolon Nations M.D.   On: 05/16/2015 15:17    Assessment & Plan:   Problem List Items Addressed This Visit    Chronic suprapubic pain    Suspect recurrent inginal hernia, given normal pelvic exam and normal UA.  Will obtain CT to assess given prior inguinal hernia repair        Other Visit Diagnoses    UTI symptoms    -  Primary    Relevant Orders    POCT urinalysis dipstick (Completed)    Urine culture    Chronic RLQ pain        Relevant Orders    CT Abdomen W Contrast       I have discontinued Ms. Warne's ergocalciferol. I am also having her maintain her apixaban, omeprazole, amiodarone, amLODipine, cephALEXin, and Vitamin D3.  Meds ordered this encounter  Medications  . Cholecalciferol (VITAMIN D3) 1000 UNITS CAPS    Sig: Take 1 capsule by mouth daily.    Medications Discontinued During This Encounter  Medication Reason  . ergocalciferol (DRISDOL) 50000 UNITS capsule Completed Course    Follow-up: No Follow-up on file.   [name], MD

## 2015-10-13 NOTE — Progress Notes (Signed)
Pre-visit discussion using our clinic review tool. No additional management support is needed unless otherwise documented below in the visit note.  

## 2015-10-14 ENCOUNTER — Other Ambulatory Visit (INDEPENDENT_AMBULATORY_CARE_PROVIDER_SITE_OTHER): Payer: Medicare Other

## 2015-10-14 ENCOUNTER — Other Ambulatory Visit: Payer: Self-pay | Admitting: Internal Medicine

## 2015-10-14 DIAGNOSIS — R7301 Impaired fasting glucose: Secondary | ICD-10-CM | POA: Diagnosis not present

## 2015-10-14 DIAGNOSIS — R102 Pelvic and perineal pain: Secondary | ICD-10-CM

## 2015-10-14 DIAGNOSIS — G8929 Other chronic pain: Secondary | ICD-10-CM

## 2015-10-14 DIAGNOSIS — R1031 Right lower quadrant pain: Principal | ICD-10-CM

## 2015-10-14 LAB — HEMOGLOBIN A1C: Hgb A1c MFr Bld: 5.9 % (ref 4.6–6.5)

## 2015-10-14 NOTE — Assessment & Plan Note (Signed)
Suspect recurrent inginal hernia, given normal pelvic exam and normal UA.  Will obtain CT to assess given prior inguinal hernia repair

## 2015-10-16 LAB — URINE CULTURE: Colony Count: 75000

## 2015-10-17 ENCOUNTER — Ambulatory Visit (INDEPENDENT_AMBULATORY_CARE_PROVIDER_SITE_OTHER): Payer: Medicare Other | Admitting: Cardiovascular Disease

## 2015-10-17 ENCOUNTER — Telehealth: Payer: Self-pay

## 2015-10-17 ENCOUNTER — Other Ambulatory Visit: Payer: Self-pay | Admitting: Internal Medicine

## 2015-10-17 ENCOUNTER — Encounter: Payer: Self-pay | Admitting: Cardiovascular Disease

## 2015-10-17 ENCOUNTER — Ambulatory Visit
Admission: RE | Admit: 2015-10-17 | Discharge: 2015-10-17 | Disposition: A | Discharge status: Home / Self Care | Payer: Medicare Other | Source: Ambulatory Visit | Attending: Internal Medicine | Admitting: Internal Medicine

## 2015-10-17 VITALS — BP 102/60 | HR 48 | Ht 64.0 in | Wt 158.2 lb

## 2015-10-17 DIAGNOSIS — I10 Essential (primary) hypertension: Secondary | ICD-10-CM | POA: Diagnosis not present

## 2015-10-17 DIAGNOSIS — I43 Cardiomyopathy in diseases classified elsewhere: Secondary | ICD-10-CM | POA: Diagnosis not present

## 2015-10-17 DIAGNOSIS — G8929 Other chronic pain: Secondary | ICD-10-CM | POA: Diagnosis not present

## 2015-10-17 DIAGNOSIS — I4819 Other persistent atrial fibrillation: Secondary | ICD-10-CM

## 2015-10-17 DIAGNOSIS — I481 Persistent atrial fibrillation: Secondary | ICD-10-CM | POA: Diagnosis not present

## 2015-10-17 DIAGNOSIS — R000 Tachycardia, unspecified: Secondary | ICD-10-CM | POA: Diagnosis not present

## 2015-10-17 DIAGNOSIS — R1030 Lower abdominal pain, unspecified: Secondary | ICD-10-CM

## 2015-10-17 DIAGNOSIS — R1031 Right lower quadrant pain: Secondary | ICD-10-CM | POA: Insufficient documentation

## 2015-10-17 MED ORDER — NITROFURANTOIN MONOHYD MACRO 100 MG PO CAPS: 100.0000 mg | ORAL_CAPSULE | Freq: Two times a day (BID) | ORAL | Status: DC

## 2015-10-17 MED ORDER — CIPROFLOXACIN HCL 500 MG PO TABS: 500.0000 mg | ORAL_TABLET | Freq: Two times a day (BID) | ORAL | Status: DC

## 2015-10-17 NOTE — Patient Instructions (Signed)
Medication Instructions:  Your physician recommends that you continue on your current medications as directed. Please refer to the Current Medication list given to you today.   Labwork: none  Testing/Procedures: Your physician has requested that you have an echocardiogram. Echocardiography is a painless test that uses sound waves to create images of your heart. It provides your doctor with information about the size and shape of your heart and how well your heart's chambers and valves are working. This procedure takes approximately one hour. There are no restrictions for this procedure.    Follow-Up: Your physician recommends that you schedule a follow-up appointment in: 4 months with Dr. Fletcher Anon.    Any Other Special Instructions Will Be Listed Below (If Applicable).     If you need a refill on your cardiac medications before your next appointment, please call your pharmacy.  Echocardiogram An echocardiogram, or echocardiography, uses sound waves (ultrasound) to produce an image of your heart. The echocardiogram is simple, painless, obtained within a short period of time, and offers valuable information to your health care provider. The images from an echocardiogram can provide information such as:  Evidence of coronary artery disease (CAD).  Heart size.  Heart muscle function.  Heart valve function.  Aneurysm detection.  Evidence of a past heart attack.  Fluid buildup around the heart.  Heart muscle thickening.  Assess heart valve function. LET [facility] CARE PROVIDER KNOW ABOUT:  Any allergies you have.  All medicines you are taking, including vitamins, herbs, eye drops, creams, and over-the-counter medicines.  Previous problems you or members of your family have had with the use of anesthetics.  Any blood disorders you have.  Previous surgeries you have had.  Medical conditions you have.  Possibility of pregnancy, if this applies. BEFORE THE PROCEDURE   No special preparation is needed. Eat and drink normally.  PROCEDURE   In order to produce an image of your heart, gel will be applied to your chest and a wand-like tool (transducer) will be moved over your chest. The gel will help transmit the sound waves from the transducer. The sound waves will harmlessly bounce off your heart to allow the heart images to be captured in real-time motion. These images will then be recorded.  You may need an IV to receive a medicine that improves the quality of the pictures. AFTER THE PROCEDURE You may return to your normal schedule including diet, activities, and medicines, unless your health care provider tells you otherwise.   This information is not intended to replace advice given to you by your health care provider. Make sure you discuss any questions you have with your health care provider.   Document Released: 12/03/2000 Document Revised: 12/27/2014 Document Reviewed: 08/13/2013 Elsevier Interactive Patient Education [employer].

## 2015-10-17 NOTE — Progress Notes (Signed)
HPI  [name] is a 79 year old female who is here today for a followup visit.  She has known history of persistent atrial fibrillation/flutter status post multiple cardioversions in the past. 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 She had cardiac catheterization twice in the past without significant coronary artery disease.  Most recent cardioversion was in August of 2014. She did have bradycardia post cardioversion. She has known history of labile hypertension. Most recent Echocardiogram in September 2015 showed normal LV systolic function with moderately dilated left atrium, mild mitral regurgitation, moderate tricuspid regurgitation and moderate to severe pulmonary hypertension.  The dose of amiodarone was decreased a few months ago to 100 mg once daily due to intermittent Mobitz 2 AV block..  Her resting heart rate improved gradually since then and it's mostly running in the 50s and 60s now and rarely below 50s. She continues to complain of fatigue and low energy. She is having recurrent urinary tract infections and she is going to have CT scan of the abdomen. She complains of chronic exertional dyspnea without chest pain. No syncope or presyncope.  Allergies  Allergen Reactions  . Biaxin [Clarithromycin]   . Codeine   . Fluocinonide Other (See Comments)    Tingling sensation in head and redness to scalp.  . Iodine   . Promethazine Other (See Comments)  . Warfarin Sodium      Current Outpatient Prescriptions on File Prior to Visit  Medication Sig Dispense Refill  . amiodarone (PACERONE) 100 MG tablet Take 1 tablet (100 mg total) by mouth daily. 30 tablet 5  . amLODipine (NORVASC) 2.5 MG tablet Take 2 tablets (5 mg total) by mouth daily. 60 tablet 3  . apixaban (ELIQUIS) 5 MG TABS tablet Take 1 tablet (5 mg total) by mouth 2 (two) times daily. 60 tablet 6  . Cholecalciferol (VITAMIN D3) 1000 UNITS CAPS Take 1 capsule by mouth daily.    [name]  omeprazole (PRILOSEC) 20 MG capsule Take 1 capsule (20 mg total) by mouth daily. 180 capsule 1  . pravastatin (PRAVACHOL) 20 MG tablet TAKE ONE (1) TABLET BY MOUTH EVERY DAY 90 tablet 2   No current facility-administered medications on file prior to visit.     Past Medical History  Diagnosis Date  . Atrial fibrillation (HCC)     Paroxysmal, hx of  . Cardiomyopathy   . Dyspnea   . Headache(784.0)     chronic  . Hypertension   . Pre-syncope   . Chronic kidney disease     acute renal failure secondary to dehydration which is now resolved  . Light headedness     due to dehydration  . Atrial flutter ([place]) 02/2011    s/p cardioversion   . Hyperlipidemia   . Knee fracture      Past Surgical History  Procedure Laterality Date  . Cardioversion      x 3  . Cholecystectomy    . Appendectomy    . Multiple orthopedic procedures    . Mastectomy  1986    Bilateral with silicone  breast implants, s/p saline replacements  . Cardiac catheterization    . Combined augmentation mammaplasty and abdominoplasty    . Abdominal hysterectomy  1990  . Total knee arthroplasty Left   . Cardioversion    . Augmentation mammaplasty Bilateral 1986    implants  . Augmentation mammaplasty  1990  . Augmentation mammaplasty  2011     Family History  Problem Relation Age  of Onset  . Heart disease Mother   . Cancer Father     stomach  . Cancer Sister     breast  . Breast cancer Sister   . Cancer Maternal Grandmother     breast  . Breast cancer Maternal Grandmother   . Cancer Sister     breast  . Breast cancer Sister   . Diabetes Neg Hx   . Heart disease Son     found at autopsy  . Breast cancer Sister      Social History   Social History  . Marital Status: Married    Spouse Name: N/A  . Number of Children: 1  . Years of Education: N/A   Occupational History  .     Social History Main Topics  . Smoking status: Never Smoker   . Smokeless tobacco: Never Used  . Alcohol Use: No   . Drug Use: No  . Sexual Activity: No   Other Topics Concern  . Not on file   Social History Narrative     PHYSICAL EXAM   BP 102/60 mmHg  Pulse 48  Ht 5\' 4"  (1.626 m)  Wt 158 lb 4 oz (71.782 kg)  BMI 27.15 kg/m2 Constitutional: She is oriented to person, place, and time. She appears well-developed and well-nourished. No distress.  HENT: No nasal discharge.  Head: Normocephalic and atraumatic.  Eyes: Pupils are equal and round. Right eye exhibits no discharge. Left eye exhibits no discharge.  Neck: Normal range of motion. Neck supple. No JVD present. No thyromegaly present.  Cardiovascular: Normal rate, irregular rhythm, normal heart sounds. Exam reveals no gallop and no friction rub. No murmur heard.  Pulmonary/Chest: Effort normal and breath sounds normal. No stridor. No respiratory distress. She has no wheezes. She has no rales. She exhibits no tenderness.  Abdominal: Soft. Bowel sounds are normal. She exhibits no distension. There is no tenderness. There is no rebound and no guarding.  Musculoskeletal: Normal range of motion. She exhibits trace edema and no tenderness.  Neurological: She is alert and oriented to person, place, and time. Coordination normal.  Skin: Skin is warm and dry. No rash noted. She is not diaphoretic. No erythema. No pallor.  Psychiatric: She has a normal mood and affect. Her behavior is normal. Judgment and thought content normal.     EKG: Sinus  Bradycardia  -Right bundle branch block.   -  Negative T-waves  -Possible  Anterolateral  ischemia.   ABNORMAL    ASSESSMENT AND PLAN

## 2015-10-17 NOTE — Assessment & Plan Note (Signed)
She is maintaining in sinus rhythm with small dose amiodarone. I do not see evidence of second-degree AV block on her current EKG. Continue to monitor. If bradycardia becomes more symptomatic, then she will require a permanent pacemaker. I'm hesitant to discontinue amiodarone altogether given her recurrent episodes of highly symptomatic atrial fibrillation in the past. Continue anticoagulation.

## 2015-10-17 NOTE — Progress Notes (Signed)
Completed.

## 2015-10-17 NOTE — Assessment & Plan Note (Signed)
Blood pressure is reasonably controlled on amlodipine.

## 2015-10-17 NOTE — Telephone Encounter (Signed)
Called in to [facility] per dr. Derrel Nip

## 2015-10-17 NOTE — Telephone Encounter (Signed)
Patient scheduled for CT of ABD and PELVIS with Contrast.  Patient has documented allergy to IV dye?  Please advise.    Per technologist depending on what you are looking for inguinal hernia can be viewed without contrast, depends on what you are looking for.  This was for a 3 month follow up for abdominal pain.

## 2015-10-17 NOTE — Assessment & Plan Note (Signed)
Given her persistent symptoms of fatigue and shortness of breath, I requested an echocardiogram for evaluation especially with the presence of moderate to severe pulmonary hypertension on echocardiogram from last year. If pulmonary hypertension is still present, she might require a right heart catheterization.

## 2015-10-23 ENCOUNTER — Telehealth: Payer: Self-pay | Admitting: Internal Medicine

## 2015-10-23 NOTE — Telephone Encounter (Signed)
Pt states she is ready to see the Dr Tamala Julian the surgeon due to her bladder fallen. Thank You!

## 2015-10-23 NOTE — Telephone Encounter (Signed)
Please clarify to  patient that Dr Tamala Julian repairs hernias but not  cystoceles (prolapsed bladders),  That issue is managed by urogynecologists and gynecologists.  I recommend either [facility] if she wants to stay i n [place], or Dr Blima Rich at [facility] who is excellent.  Would she like a referral and to whom?

## 2015-10-24 NOTE — Telephone Encounter (Signed)
Spoke with patient, she is unable to go to [facility], and Dr. Enzo Bi is already her doctor so she will go there.  She said that she has a follow up in February with him already.

## 2015-11-07 ENCOUNTER — Other Ambulatory Visit: Payer: Self-pay

## 2015-11-07 ENCOUNTER — Ambulatory Visit (INDEPENDENT_AMBULATORY_CARE_PROVIDER_SITE_OTHER): Payer: Medicare Other

## 2015-11-07 DIAGNOSIS — I4819 Other persistent atrial fibrillation: Secondary | ICD-10-CM

## 2015-11-07 DIAGNOSIS — I481 Persistent atrial fibrillation: Secondary | ICD-10-CM | POA: Diagnosis not present

## 2015-11-12 ENCOUNTER — Ambulatory Visit (INDEPENDENT_AMBULATORY_CARE_PROVIDER_SITE_OTHER): Payer: Medicare Other | Admitting: Obstetrics and Gynecology

## 2015-11-12 ENCOUNTER — Encounter: Payer: Self-pay | Admitting: Obstetrics and Gynecology

## 2015-11-12 VITALS — BP 128/63 | HR 60 | Ht 63.0 in | Wt 161.1 lb

## 2015-11-12 DIAGNOSIS — N3289 Other specified disorders of bladder: Secondary | ICD-10-CM | POA: Diagnosis not present

## 2015-11-12 DIAGNOSIS — Z803 Family history of malignant neoplasm of breast: Secondary | ICD-10-CM | POA: Diagnosis not present

## 2015-11-12 DIAGNOSIS — N8110 Cystocele, unspecified: Secondary | ICD-10-CM

## 2015-11-12 DIAGNOSIS — N952 Postmenopausal atrophic vaginitis: Secondary | ICD-10-CM | POA: Diagnosis not present

## 2015-11-12 DIAGNOSIS — N390 Urinary tract infection, site not specified: Secondary | ICD-10-CM | POA: Diagnosis not present

## 2015-11-12 DIAGNOSIS — IMO0002 Reserved for concepts with insufficient information to code with codable children: Secondary | ICD-10-CM

## 2015-11-12 LAB — POCT URINALYSIS DIPSTICK
Bilirubin, UA: NEGATIVE
Glucose, UA: NEGATIVE
Leukocytes, UA: NEGATIVE
Nitrite, UA: NEGATIVE
Protein, UA: NEGATIVE
Spec Grav, UA: 1.025
Urobilinogen, UA: 0.2
pH, UA: 5

## 2015-11-12 MED ORDER — TOLTERODINE TARTRATE ER 4 MG PO CP24: 4.0000 mg | ORAL_CAPSULE | Freq: Every day | ORAL | Status: DC

## 2015-11-12 MED ORDER — ESTROGENS, CONJUGATED 0.625 MG/GM VA CREA: TOPICAL_CREAM | VAGINAL | Status: DC

## 2015-11-12 NOTE — Patient Instructions (Signed)
1.  Begin Detrol LA 4 mg at night for urinary frequency, urgency and voiding at night. 2.  Begin estrogen vaginal cream twice a week for severe vagina Atrophy symptoms 3.  Cystocele is first to second degree and is not really causing problems that would require intervention at present. 4.  Return in 6 weeks for follow-up

## 2015-11-12 NOTE — Progress Notes (Signed)
Patient ID: [name] [name], female   DOB: [birth year], 79 y.o.   MRN: [name]  Chief complaint: 1.  Cystocele. 2.  Unstable bladder symptoms  The patient is a 79 year old married white female, Eric 2002, menopausal, on no hormone replacement therapy, having no vasomotor symptoms, with strong family history of breast cancer (sisters 58, maternal grandmother), status post subcutaneous mastectomy/silicone breast implant placement/rest implant replacement), status post TAH/BSO, presents for follow-up on unstable bladder symptoms and recently identified cystocele on pelvic CT scan.  Nursing intake: bladder drop noted on ct 09/2015- dr Derrel Nip 2 uti in 2 months Right side pelvic pain Urgency Getting worse- over the last year   Past Medical History  Diagnosis Date  . Atrial fibrillation (HCC)     Paroxysmal, hx of  . Cardiomyopathy   . Dyspnea   . Headache(784.0)     chronic  . Hypertension   . Pre-syncope   . Chronic kidney disease     acute renal failure secondary to dehydration which is now resolved  . Light headedness     due to dehydration  . Atrial flutter ([place]) 02/2011    s/p cardioversion   . Hyperlipidemia   . Knee fracture   . Cystocele    Past Surgical History  Procedure Laterality Date  . Cardioversion      x 3  . Cholecystectomy    . Appendectomy    . Multiple orthopedic procedures    . Mastectomy  1986    Bilateral with silicone  breast implants, s/p saline replacements  . Cardiac catheterization    . Combined augmentation mammaplasty and abdominoplasty    . Abdominal hysterectomy  1990  . Total knee arthroplasty Left   . Cardioversion    . Augmentation mammaplasty Bilateral 1986    implants  . Augmentation mammaplasty  1990  . Augmentation mammaplasty  2011    Past medical history: Past surgical history, problem list, medications, and allergies are reviewed.  Review of systems: Per HPI  OBJECTIVE: BP 128/63 mmHg  Pulse 60  Ht 5\' 3"  (1.6 m)  Wt 161  lb 1.6 oz (73.074 kg)  BMI 28.54 kg/m2 Pleasant elderly female in no acute distress.  She is alert and oriented. Back: No CVA tenderness. Abdomen: Soft, nontender, without organomegaly.  Pelvic exam: External genitalia-atrophic changes; Sharon hemangiomas are present. BUS-normal. Vagina-moderate atrophy; no significant discharge; first to second degree cystocele is noted, made worse with Valsalva; no rectocele; good apex vaginal support.  ASSESSMENT: 1.  Unstable bladder symptoms. 2.  Vaginal atrophy. 3.  Recent CT scan demonstrating cystocele; do not feel this is complicating symptomatology at this time.  PLAN: 1.  Begin Detrol LA 4 mg daily at bedtime 2.   Premarin cream intravaginally biweekly. 3.  Return in 6 weeks for reassessment  Brayton Mars, MD  Note: This dictation was prepared with Dragon dictation along with smaller phrase technology. Any transcriptional errors that result from this process are unintentional.

## 2015-11-14 ENCOUNTER — Other Ambulatory Visit: Payer: Self-pay | Admitting: Internal Medicine

## 2015-11-14 LAB — URINE CULTURE

## 2015-11-17 ENCOUNTER — Other Ambulatory Visit: Payer: Self-pay

## 2015-11-17 MED ORDER — OXYBUTYNIN CHLORIDE ER 5 MG PO TB24: 5.0000 mg | ORAL_TABLET | Freq: Every day | ORAL | Status: DC

## 2015-11-17 NOTE — Telephone Encounter (Signed)
Refill denied.  Needs to have OV to refill vicodin

## 2015-11-18 DIAGNOSIS — N3289 Other specified disorders of bladder: Secondary | ICD-10-CM | POA: Insufficient documentation

## 2015-11-18 DIAGNOSIS — N952 Postmenopausal atrophic vaginitis: Secondary | ICD-10-CM | POA: Insufficient documentation

## 2015-11-18 DIAGNOSIS — IMO0002 Reserved for concepts with insufficient information to code with codable children: Secondary | ICD-10-CM | POA: Insufficient documentation

## 2015-11-19 ENCOUNTER — Telehealth: Payer: Self-pay | Admitting: Internal Medicine

## 2015-11-19 NOTE — Telephone Encounter (Signed)
Pt came in requesting a refill on HYDROcodone-acetaminophen (NORCO/VICODIN) 5-325 MG tablet.. Please advise pt.[name] To MEDICAP

## 2015-11-19 NOTE — Telephone Encounter (Signed)
Left message to call back and scheduled appt

## 2015-11-19 NOTE — Telephone Encounter (Signed)
I have already responded to this request. With a "No refill without an office visit'  . Why wasn't the patient called?  Somebody is not doing refills as outlined

## 2015-11-20 ENCOUNTER — Encounter: Payer: Self-pay | Admitting: Family Medicine

## 2015-11-20 ENCOUNTER — Ambulatory Visit (INDEPENDENT_AMBULATORY_CARE_PROVIDER_SITE_OTHER): Payer: Medicare Other | Admitting: Family Medicine

## 2015-11-20 VITALS — BP 112/76 | HR 54 | Temp 98.5°F | Ht 63.0 in | Wt 159.2 lb

## 2015-11-20 DIAGNOSIS — K219 Gastro-esophageal reflux disease without esophagitis: Secondary | ICD-10-CM | POA: Diagnosis not present

## 2015-11-20 DIAGNOSIS — R0989 Other specified symptoms and signs involving the circulatory and respiratory systems: Secondary | ICD-10-CM

## 2015-11-20 DIAGNOSIS — I10 Essential (primary) hypertension: Secondary | ICD-10-CM | POA: Diagnosis not present

## 2015-11-20 MED ORDER — HYDROCODONE-ACETAMINOPHEN 5-325 MG PO TABS: 1.0000 | ORAL_TABLET | Freq: Four times a day (QID) | ORAL | Status: DC | PRN

## 2015-11-20 MED ORDER — PANTOPRAZOLE SODIUM 20 MG PO TBEC: 20.0000 mg | DELAYED_RELEASE_TABLET | Freq: Two times a day (BID) | ORAL | Status: DC

## 2015-11-20 NOTE — Progress Notes (Signed)
Pre visit review using our clinic review tool, if applicable. No additional management support is needed unless otherwise documented below in the visit note. 

## 2015-11-20 NOTE — Telephone Encounter (Signed)
Patient scheduled an appointment to discuss.

## 2015-11-20 NOTE — Addendum Note (Signed)
Addended by: [name] on: 11/20/2015 10:05 AM   Modules accepted: Orders

## 2015-11-20 NOTE — Patient Instructions (Signed)
Your exam was normal today.  I not sure of the cause of your issues/complaints. You can feel reassured that your exam was normal.  Be sure to rest and let us know if you have worsening symptoms or  Something else changes.  I have switched you to protonix as requested.  Take care  Dr. Lacinda Axon

## 2015-11-20 NOTE — Telephone Encounter (Signed)
Please tell patient that the vicodin was a one time thing ,  Not a chronic refill so I need to have her see me in an office visit to refill. I will print one month refill since I am gong out of town IF she is in a lot of pain but needs appt

## 2015-11-20 NOTE — Assessment & Plan Note (Signed)
Patient with chest congestion and several other somatic complaints today including upper back pain, hoarseness, shortness breath. Normal exam today. Patient does have a history of pulmonary hypertension which is likely the source of her shortness of breath. I advised that this is likely viral in nature and will slowly improve. Supportive care.

## 2015-11-20 NOTE — Assessment & Plan Note (Signed)
BP well controlled today. I advised continuation of prescribed dosage of amlodipine.

## 2015-11-20 NOTE — Assessment & Plan Note (Signed)
Established problem, worsening. Stopping omeprazole and starting Protonix. Rx sent today.

## 2015-11-20 NOTE — Progress Notes (Signed)
Subjective:  Patient ID: [name] [name], female    DOB: December 01, 1936  Age: 79 y.o. MRN: [name]  CC: Elevated BP, Dizziness, GERD, Congestion, Hoarseness  HPI:  79 year old female with a complicated past medical history presents with several complaints (see above).  HTN  Patient reports that at the beginning of the week she had high blood pressure at home.  She reports that she's been taking her blood pressure medication.  She was concerned about her blood pressure elevation and was unsure why was elevated.  As a result she increased her amlodipine.  She reports associated dizziness which she described as "things looking foggy".  GERD  Patient reports that as of recent she's been having worsening heartburn.  She states that she feels like her medication is not working well.  She is requesting medication change today.  Congestion/Hoarsness  Patient reports that yesterday she began to have hoarseness and some congestion.  No exacerbating or relieving factors.  She reports she had associated chest tightness with the congestion but this is now resolved.  No associated fevers or chills.  She has noted that she's had some shortness of breath as well.  Social Hx   Social History   Social History  . Marital Status: Married    Spouse Name: N/A  . Number of Children: 1  . Years of Education: N/A   Occupational History  .     Social History Main Topics  . Smoking status: Never Smoker   . Smokeless tobacco: Never Used  . Alcohol Use: No  . Drug Use: No  . Sexual Activity: No   Other Topics Concern  . None   Social History Narrative   Review of Systems  Constitutional: Negative for fever.  HENT: Positive for congestion, sore throat and voice change.   Respiratory: Positive for shortness of breath.   Neurological: Positive for dizziness.   Objective:  BP 112/76 mmHg  Pulse 54  Temp(Src) 98.5 F (36.9 C) (Oral)  Ht 5\' 3"  (1.6 m)  Wt 159 lb 4 oz  (72.235 kg)  BMI 28.22 kg/m2  SpO2 97%  BP/Weight 11/20/2015 11/12/2015 [id number]  Systolic BP [id number]  Diastolic BP 76 63 60  Wt. (Lbs) 159.25 161.1 158.25  BMI 28.22 28.54 27.15   Physical Exam  Constitutional: She appears well-developed. No distress.  HENT:  Head: Normocephalic and atraumatic.  Mouth/Throat: Oropharynx is clear and moist.  Normal TM's bilaterally.  Eyes: Pupils are equal, round, and reactive to light.  Neck: Neck supple.  Cardiovascular: Normal rate and regular rhythm.   Pulmonary/Chest: Effort normal and breath sounds normal. No respiratory distress. She has no wheezes. She has no rales.  Neurological: She is alert.  No focal deficits.  Psychiatric: She has a normal mood and affect.  Vitals reviewed.  Lab Results  Component Value Date   WBC 6.9 10/10/2015   HGB 13.1 10/10/2015   HCT 40.5 10/10/2015   PLT 226.0 10/10/2015   GLUCOSE 172* 10/10/2015   CHOL 174 04/22/2015   TRIG 82.0 04/22/2015   HDL 61.00 04/22/2015   LDLCALC 97 04/22/2015   ALT 17 10/10/2015   AST 17 10/10/2015   NA 138 10/10/2015   K 4.2 10/10/2015   CL 105 10/10/2015   CREATININE 0.98 10/10/2015   BUN 20 10/10/2015   CO2 25 10/10/2015   TSH 2.03 04/22/2015   INR 1.6 11/05/2013   HGBA1C 5.9 10/14/2015    Assessment & Plan:   Problem List Items Addressed  This Visit    GERD (gastroesophageal reflux disease) - Primary    Established problem, worsening. Stopping omeprazole and starting Protonix. Rx sent today.       Relevant Medications   pantoprazole (PROTONIX) 20 MG tablet   Essential hypertension    BP well controlled today. I advised continuation of prescribed dosage of amlodipine.      Chest congestion    Patient with chest congestion and several other somatic complaints today including upper back pain, hoarseness, shortness breath. Normal exam today. Patient does have a history of pulmonary hypertension which is likely the source of her shortness of  breath. I advised that this is likely viral in nature and will slowly improve. Supportive care.         Meds ordered this encounter  Medications  . pantoprazole (PROTONIX) 20 MG tablet    Sig: Take 1 tablet (20 mg total) by mouth 2 (two) times daily.    Dispense:  60 tablet    Refill:  1    Follow-up: No Follow-up on file.  [place]

## 2015-11-22 ENCOUNTER — Emergency Department: Payer: Medicare Other

## 2015-11-22 ENCOUNTER — Encounter: Payer: Self-pay | Admitting: Emergency Medicine

## 2015-11-22 ENCOUNTER — Emergency Department
Admission: EM | Admit: 2015-11-22 | Discharge: 2015-11-22 | Disposition: A | Discharge status: Home / Self Care | Payer: Medicare Other | Attending: Emergency Medicine | Admitting: Emergency Medicine

## 2015-11-22 DIAGNOSIS — Z79899 Other long term (current) drug therapy: Secondary | ICD-10-CM | POA: Insufficient documentation

## 2015-11-22 DIAGNOSIS — N189 Chronic kidney disease, unspecified: Secondary | ICD-10-CM | POA: Diagnosis not present

## 2015-11-22 DIAGNOSIS — J209 Acute bronchitis, unspecified: Secondary | ICD-10-CM | POA: Diagnosis not present

## 2015-11-22 DIAGNOSIS — R05 Cough: Secondary | ICD-10-CM | POA: Diagnosis not present

## 2015-11-22 DIAGNOSIS — R0789 Other chest pain: Secondary | ICD-10-CM | POA: Diagnosis present

## 2015-11-22 DIAGNOSIS — R079 Chest pain, unspecified: Secondary | ICD-10-CM | POA: Diagnosis not present

## 2015-11-22 DIAGNOSIS — I129 Hypertensive chronic kidney disease with stage 1 through stage 4 chronic kidney disease, or unspecified chronic kidney disease: Secondary | ICD-10-CM | POA: Insufficient documentation

## 2015-11-22 LAB — CBC WITH DIFFERENTIAL/PLATELET
Basophils Absolute: 0.1 10*3/uL (ref 0–0.1)
Basophils Relative: 1 %
Eosinophils Absolute: 0.1 10*3/uL (ref 0–0.7)
Eosinophils Relative: 1 %
HCT: 39.9 % (ref 35.0–47.0)
Hemoglobin: 13.2 g/dL (ref 12.0–16.0)
Lymphocytes Relative: 13 %
Lymphs Abs: 0.9 10*3/uL — ABNORMAL LOW (ref 1.0–3.6)
MCH: 31.4 pg (ref 26.0–34.0)
MCHC: 33 g/dL (ref 32.0–36.0)
MCV: 95 fL (ref 80.0–100.0)
Monocytes Absolute: 0.4 10*3/uL (ref 0.2–0.9)
Monocytes Relative: 6 %
Neutro Abs: 5.3 10*3/uL (ref 1.4–6.5)
Neutrophils Relative %: 79 %
Platelets: 217 10*3/uL (ref 150–440)
RBC: 4.2 MIL/uL (ref 3.80–5.20)
RDW: 13.8 % (ref 11.5–14.5)
WBC: 6.8 10*3/uL (ref 3.6–11.0)

## 2015-11-22 LAB — BASIC METABOLIC PANEL
Anion gap: 5 (ref 5–15)
BUN: 17 mg/dL (ref 6–20)
CO2: 27 mmol/L (ref 22–32)
Calcium: 9.1 mg/dL (ref 8.9–10.3)
Chloride: 107 mmol/L (ref 101–111)
Creatinine, Ser: 1.13 mg/dL — ABNORMAL HIGH (ref 0.44–1.00)
GFR calc Af Amer: 53 mL/min — ABNORMAL LOW (ref >60)
GFR calc non Af Amer: 46 mL/min — ABNORMAL LOW (ref >60)
Glucose, Bld: 103 mg/dL — ABNORMAL HIGH (ref 65–99)
Potassium: 4.2 mmol/L (ref 3.5–5.1)
Sodium: 139 mmol/L (ref 135–145)

## 2015-11-22 MED ORDER — NAPROXEN 500 MG PO TABS: 500.0000 mg | ORAL_TABLET | Freq: Two times a day (BID) | ORAL | Status: DC

## 2015-11-22 MED ORDER — PREDNISONE 20 MG PO TABS: 20.0000 mg | ORAL_TABLET | Freq: Every day | ORAL | Status: DC

## 2015-11-22 MED ORDER — PREDNISONE 20 MG PO TABS
40.0000 mg | ORAL_TABLET | Freq: Once | ORAL | Status: AC
  Administered 2015-11-22: 40 mg via ORAL
  Filled 2015-11-22: qty 2

## 2015-11-22 NOTE — Discharge Instructions (Signed)

## 2015-11-22 NOTE — ED Notes (Signed)
Pt here with c/o midsternal cp/tightness that radiates to shoulder blade area. Pt states her pain has come off and on all week, at rest. States she has had a dry cough for a few days as well. Appears in no distress at this time.

## 2015-11-22 NOTE — ED Provider Notes (Signed)
[facility] Emergency Department Provider Note  ____________________________________________  Time seen: 10:40 AM  I have reviewed the triage vital signs and the nursing notes.   HISTORY  Chief Complaint Chest Pain    HPI [name] [name] is a 79 y.o. female who complains of midsternal aching for approximately one week. It is intermittent lasting only a minute or 2 at a time, sometimes radiates to the upper back as well. No nausea vomiting diaphoresis or shortness of breath. Not exertional. She also has a nonproductive cough at times. No recent travel trauma hospitalizations or surgeries. No fever or chills. No dizziness. She recently saw her primary care doctor who advised rest and hydration and did not prescribe any steroids or antibiotics at that time..     Past Medical History  Diagnosis Date  . Atrial fibrillation (HCC)     Paroxysmal, hx of  . Cardiomyopathy   . Dyspnea   . Headache(784.0)     chronic  . Hypertension   . Pre-syncope   . Chronic kidney disease     acute renal failure secondary to dehydration which is now resolved  . Light headedness     due to dehydration  . Atrial flutter ([place]) 02/2011    s/p cardioversion   . Hyperlipidemia   . Knee fracture   . Cystocele      Patient Active Problem List   Diagnosis Date Noted  . GERD (gastroesophageal reflux disease) 11/20/2015  . Chest congestion 11/20/2015  . Cystocele 11/18/2015  . Unstable bladder 11/18/2015  . Vaginal atrophy 11/18/2015  . Chronic suprapubic pain 10/14/2015  . Inguinal hernia 10/13/2015  . Dysuria 09/12/2015  . Generalized weakness 07/24/2015  . Impaired fasting glucose 04/24/2015  . Pulmonary hypertension, moderate to severe ([place]) 04/24/2015  . Arthritis of knee, degenerative 02/21/2015  . 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 Vitamin D deficiency 12/27/2014  . S/P TAH-BSO 12/13/2014  . S/P bilateral mastectomy  12/13/2014  . Long term current use of anticoagulant therapy 09/25/2014  . HH (hiatus hernia) 04/20/2014  . Tachycardia induced cardiomyopathy ([place]) 07/20/2013  . Unspecified vitamin D deficiency 04/24/2013  . Family history of breast cancer in female 04/23/2013  . Medicare annual wellness visit, subsequent 04/23/2013  . Obesity 06/30/2012  . Fatigue 06/22/2012  . History of [place] spotted fever 06/22/2012  . Persistent atrial fibrillation ([place]) 05/09/2012  . Anxiety and depression 05/09/2012  . TACHYCARDIA 02/01/2011  . Hyperlipidemia 07/08/2010  . Essential hypertension 03/05/2009  . HEADACHE, CHRONIC 03/05/2009     Past Surgical History  Procedure Laterality Date  . Cardioversion      x 3  . Cholecystectomy    . Appendectomy    . Multiple orthopedic procedures    . Mastectomy  1986    Bilateral with silicone  breast implants, s/p saline replacements  . Cardiac catheterization    . Combined augmentation mammaplasty and abdominoplasty    . Abdominal hysterectomy  1990  . Total knee arthroplasty Left   . Cardioversion    . Augmentation mammaplasty Bilateral 1986    implants  . Augmentation mammaplasty  1990  . Augmentation mammaplasty  2011     Current Outpatient Rx  Name  Route  Sig  Dispense  Refill  . amiodarone (PACERONE) 100 MG tablet   Oral   Take 1 tablet (100 mg total) by mouth daily.   30 tablet   5   . amLODipine (NORVASC) 2.5 MG tablet  Oral   Take 2 tablets (5 mg total) by mouth daily.   60 tablet   3   . apixaban (ELIQUIS) 5 MG TABS tablet   Oral   Take 1 tablet (5 mg total) by mouth 2 (two) times daily.   60 tablet   6   . Cholecalciferol (VITAMIN D3) 1000 UNITS CAPS   Oral   Take 1 capsule by mouth daily.         [name] conjugated estrogens (PREMARIN) vaginal cream      1/2-1 gram intravaginal BIW   60 g   1   . HYDROcodone-acetaminophen (NORCO/VICODIN) 5-325 MG tablet   Oral   Take 1 tablet by mouth every 6 (six) hours as  needed for moderate pain.   30 tablet   0   . Multiple Vitamins-Minerals (CENTRUM ADULTS PO)   Oral   Take by mouth.         . naproxen (NAPROSYN) 500 MG tablet   Oral   Take 1 tablet (500 mg total) by mouth 2 (two) times daily with a meal.   20 tablet   0   . oxybutynin (DITROPAN-XL) 5 MG 24 hr tablet   Oral   Take 1 tablet (5 mg total) by mouth at bedtime.   30 tablet   6   . pantoprazole (PROTONIX) 20 MG tablet   Oral   Take 1 tablet (20 mg total) by mouth 2 (two) times daily.   60 tablet   1   . pravastatin (PRAVACHOL) 20 MG tablet      TAKE ONE (1) TABLET BY MOUTH EVERY DAY   90 tablet   2   . predniSONE (DELTASONE) 20 MG tablet   Oral   Take 1 tablet (20 mg total) by mouth daily.   4 tablet   0      Allergies Biaxin; Codeine; Fluocinonide; Iodine; Promethazine; and Warfarin sodium   Family History  Problem Relation Age of Onset  . Heart disease Mother   . Cancer Father     stomach  . Cancer Sister     breast  . Breast cancer Sister   . Cancer Maternal Grandmother     breast  . Breast cancer Maternal Grandmother   . Cancer Sister     breast  . Breast cancer Sister   . Diabetes Neg Hx   . Heart disease Son     found at autopsy  . Breast cancer Sister     Social History Social History  Substance Use Topics  . Smoking status: Never Smoker   . Smokeless tobacco: Never Used  . Alcohol Use: No    Review of Systems  Constitutional:   No fever or chills. No weight changes Eyes:   No blurry vision or double vision.  ENT:   No sore throat. Cardiovascular:   Positive chest pain. Respiratory:   No dyspnea positive nonproductive cough. Patient has some baseline dyspnea related to pulmonary hypertension, but she denies any thing out of the ordinary Gastrointestinal:   Negative for abdominal pain, vomiting and diarrhea.  No BRBPR or melena. Genitourinary:   Negative for dysuria, urinary retention, bloody urine, or difficulty  urinating. Musculoskeletal:   Negative for back pain. No joint swelling or pain. Skin:   Negative for rash. Neurological:   Negative for headaches, focal weakness or numbness. Psychiatric:  No anxiety or depression.   Endocrine:  No hot/cold intolerance, changes in energy, or sleep difficulty.  10-point ROS otherwise negative.  ____________________________________________   PHYSICAL EXAM:  VITAL SIGNS: ED Triage Vitals  Enc Vitals Group     BP 11/22/15 1035 126/58 mmHg     Pulse Rate 11/22/15 1035 53     Resp 11/22/15 1035 18     Temp 11/22/15 1035 97.5 F (36.4 C)     Temp Source 11/22/15 1035 Oral     SpO2 11/22/15 1035 100 %     Weight 11/22/15 1035 159 lb (72.122 kg)     Height 11/22/15 1035 5\' 4"  (1.626 m)     Head Cir --      Peak Flow --      Pain Score 11/22/15 1032 7     Pain Loc --      Pain Edu? --      Excl. in [place]? --      Constitutional:   Alert and oriented. Well appearing and in no distress. Eyes:   No scleral icterus. No conjunctival pallor. PERRL. EOMI ENT   Head:   Normocephalic and atraumatic.   Nose:   No congestion/rhinnorhea. No septal hematoma   Mouth/Throat:   MMM, no pharyngeal erythema. No peritonsillar mass. No uvula shift.   Neck:   No stridor. No SubQ emphysema. No meningismus. Hematological/Lymphatic/Immunilogical:   No cervical lymphadenopathy. Cardiovascular:   RRR. Normal and symmetric distal pulses are present in all extremities. No murmurs, rubs, or gallops. Respiratory:   Normal respiratory effort without tachypnea nor retractions. Breath sounds are clear and equal bilaterally except for small amount of crackles deep in the left base. No wheezes or rales. Gastrointestinal:   Soft and nontender. No distention. There is no CVA tenderness.  No rebound, rigidity, or guarding. Genitourinary:   deferred Musculoskeletal:   Nontender with normal range of motion in all extremities. No joint effusions.  No lower extremity  tenderness.  No edema. Neurologic:   Normal speech and language.  CN 2-10 normal. Motor grossly intact. No pronator drift.  Normal gait. No gross focal neurologic deficits are appreciated.  Skin:    Skin is warm, dry and intact. No rash noted.  No petechiae, purpura, or bullae. Psychiatric:   Mood and affect are normal. Speech and behavior are normal. Patient exhibits appropriate insight and judgment.  ____________________________________________    LABS (pertinent positives/negatives) (all labs ordered are listed, but only abnormal results are displayed) Labs Reviewed  BASIC METABOLIC PANEL - Abnormal; Notable for the following:    Glucose, Bld 103 (*)    Creatinine, Ser 1.13 (*)    GFR calc non Af Amer 46 (*)    GFR calc Af Amer 53 (*)    All other components within normal limits  CBC WITH DIFFERENTIAL/PLATELET - Abnormal; Notable for the following:    Lymphs Abs 0.9 (*)    All other components within normal limits   ____________________________________________   EKG  Interpreted by me Sinus bradycardia rate of 55, normal axis and intervals. Right bundle branch block. Normal ST segments, diffuse T-wave inversions in inferior and anterior leads, unchanged from previous  ____________________________________________    RADIOLOGY  Chest x-ray unremarkable except for benign chronic opacity in the left costophrenic angle  ____________________________________________   PROCEDURES   ____________________________________________   INITIAL IMPRESSION / ASSESSMENT AND PLAN / ED COURSE  Pertinent labs & imaging results that were available during my care of the patient were reviewed by me and considered in my medical decision making (see chart for details).  Patient presents with persistent nonproductive cough and chest discomfort.  We'll check labs and x-ray. Possible atypical pneumonia. Low suspicion for ACS PE TAD pneumothorax carditis mediastinitis or sepsis. She is  overall well appearing and in no distress.  ----------------------------------------- 11:26 AM on 11/22/2015 -----------------------------------------  Workup negative. Vital signs stable. She is artery on Norco for her cough, and I'll add NSAIDs and low-dose prednisone as well. Continue following up with primary care Dr. Derrel Nip.   ____________________________________________   FINAL CLINICAL IMPRESSION(S) / ED DIAGNOSES  Final diagnoses:  Acute bronchitis, unspecified organism      Carrie Mew, MD 11/22/15 1126

## 2015-11-25 ENCOUNTER — Other Ambulatory Visit: Payer: Self-pay

## 2015-11-25 ENCOUNTER — Telehealth: Payer: Self-pay

## 2015-11-25 MED ORDER — AMLODIPINE BESYLATE 2.5 MG PO TABS: 5.0000 mg | ORAL_TABLET | Freq: Every day | ORAL | Status: DC

## 2015-11-25 NOTE — Telephone Encounter (Signed)
S/w pt who requests refill on amlodipine and eliquis samples.  Refill submitted and samples left at front desk.  Medication Samples have been provided to the patient.  Drug name: Eliquis 5mg   Qty: 4 boxes  LOT: [id number]  Exp.Date: 1/19  The patient has been instructed regarding the correct time, dose, and frequency of taking this medication, including desired effects and most common side effects.   [name] [name] 9:44 AM 11/25/2015

## 2015-11-26 ENCOUNTER — Encounter: Payer: Self-pay | Admitting: Internal Medicine

## 2015-11-26 ENCOUNTER — Ambulatory Visit (INDEPENDENT_AMBULATORY_CARE_PROVIDER_SITE_OTHER): Payer: Medicare Other | Admitting: Internal Medicine

## 2015-11-26 ENCOUNTER — Telehealth: Payer: Self-pay | Admitting: Internal Medicine

## 2015-11-26 VITALS — BP 138/62 | HR 61 | Temp 98.2°F | Ht 63.0 in | Wt 162.5 lb

## 2015-11-26 DIAGNOSIS — J209 Acute bronchitis, unspecified: Secondary | ICD-10-CM | POA: Diagnosis not present

## 2015-11-26 DIAGNOSIS — F419 Anxiety disorder, unspecified: Secondary | ICD-10-CM

## 2015-11-26 DIAGNOSIS — H2513 Age-related nuclear cataract, bilateral: Secondary | ICD-10-CM | POA: Diagnosis not present

## 2015-11-26 DIAGNOSIS — F329 Major depressive disorder, single episode, unspecified: Secondary | ICD-10-CM

## 2015-11-26 DIAGNOSIS — F418 Other specified anxiety disorders: Secondary | ICD-10-CM | POA: Diagnosis not present

## 2015-11-26 DIAGNOSIS — F32A Depression, unspecified: Secondary | ICD-10-CM

## 2015-11-26 MED ORDER — PREDNISONE 10 MG PO TABS: ORAL_TABLET | ORAL | Status: DC

## 2015-11-26 MED ORDER — DIAZEPAM 5 MG PO TABS: 5.0000 mg | ORAL_TABLET | Freq: Every evening | ORAL | Status: DC | PRN

## 2015-11-26 NOTE — Telephone Encounter (Signed)
Pt husband states pt needs a follow up appt to check the bronchitis that she was diagnosed with at the ED. No appt avail to sch. Pt only wants to sse Dr Derrel Nip. Let me know where to sch. Thank You!

## 2015-11-26 NOTE — Telephone Encounter (Signed)
Left message to call back  

## 2015-11-26 NOTE — Patient Instructions (Signed)
I am giving you a prednisone taper to help resolve your bronchitis.  I am prescribing  generic Valium to help you rest at night,  Start with 1/2 tablet,  This will relax your tense muscles and hopefully help your headache

## 2015-11-26 NOTE — Progress Notes (Signed)
Subjective:  Patient ID: [name] [name], female    DOB: 1936/07/19  Age: 79 y.o. MRN: [name]  CC: The primary encounter diagnosis was Acute bronchitis, unspecified organism. A diagnosis of Anxiety and depression was also pertinent to this visit.  HPI [name] [name] presents for persistent  Cough.  She was treated for viral URI on dec 1  by Dr. Lorelee Cover was treated in ED on Dec 3,  Because she presented to Urgent Care with  persistent symptoms accompanied by  chest pain .  After an evaluation with EKG,  Cardiac enzymes and chest x ray,  She was prescribed naproxen and prednisone by Dr Joni Fears .  Chest x ray was clear except for stable left  Sided scarring at the CPA scar seen in 2014 . She continues to reort a feeling of tightness in chest and shoulder blades. .  Did not take the naproxen. She is requesting refill of the hydrocdone for shoulder blade pain   The cough has improved,  But still having a lot of sinus post nasal drainage which she has been trying to cough up. Has a nighttime cough   Having  trouble sleeping  Due to recurrent tension headaches. She feels very anxious most of the time due to her husband's recurrence of lymphoma.          Outpatient Prescriptions Prior to Visit  Medication Sig Dispense Refill  . amiodarone (PACERONE) 100 MG tablet Take 1 tablet (100 mg total) by mouth daily. 30 tablet 5  . amLODipine (NORVASC) 2.5 MG tablet Take 2 tablets (5 mg total) by mouth daily. 60 tablet 6  . apixaban (ELIQUIS) 5 MG TABS tablet Take 1 tablet (5 mg total) by mouth 2 (two) times daily. 60 tablet 6  . Cholecalciferol (VITAMIN D3) 1000 UNITS CAPS Take 1 capsule by mouth daily.    [name] conjugated estrogens (PREMARIN) vaginal cream 1/2-1 gram intravaginal BIW 60 g 1  . HYDROcodone-acetaminophen (NORCO/VICODIN) 5-325 MG tablet Take 1 tablet by mouth every 6 (six) hours as needed for moderate pain. 30 tablet 0  . pantoprazole (PROTONIX) 20 MG tablet Take 1 tablet (20 mg total) by  mouth 2 (two) times daily. 60 tablet 1  . pravastatin (PRAVACHOL) 20 MG tablet TAKE ONE (1) TABLET BY MOUTH EVERY DAY 90 tablet 2  . Multiple Vitamins-Minerals (CENTRUM ADULTS PO) Take by mouth.    . naproxen (NAPROSYN) 500 MG tablet Take 1 tablet (500 mg total) by mouth 2 (two) times daily with a meal. 20 tablet 0  . oxybutynin (DITROPAN-XL) 5 MG 24 hr tablet Take 1 tablet (5 mg total) by mouth at bedtime. 30 tablet 6  . predniSONE (DELTASONE) 20 MG tablet Take 1 tablet (20 mg total) by mouth daily. 4 tablet 0   No facility-administered medications prior to visit.    Review of Systems;  Patient denies headache, fevers, malaise, unintentional weight loss, skin rash, eye pain, sinus congestion and sinus pain, sore throat, dysphagia,  hemoptysis , cough, dyspnea, wheezing, chest pain, palpitations, orthopnea, edema, abdominal pain, nausea, melena, diarrhea, constipation, flank pain, dysuria, hematuria, urinary  Frequency, nocturia, numbness, tingling, seizures,  Focal weakness, Loss of consciousness,  Tremor, insomnia, depression, anxiety, and suicidal ideation.      Objective:  BP 138/62 mmHg  Pulse 61  Temp(Src) 98.2 F (36.8 C) (Oral)  Ht 5\' 3"  (1.6 m)  Wt 162 lb 8 oz (73.71 kg)  BMI 28.79 kg/m2  SpO2 94%  BP Readings from Last 3  Encounters:  11/26/15 138/62  11/22/15 115/57  11/20/15 112/76    Wt Readings from Last 3 Encounters:  11/26/15 162 lb 8 oz (73.71 kg)  11/22/15 159 lb (72.122 kg)  11/20/15 159 lb 4 oz (72.235 kg)    General appearance: alert, cooperative and appears stated age Ears: normal TM's and external ear canals both ears Throat: lips, mucosa, and tongue normal; teeth and gums normal Neck: no adenopathy, no carotid bruit, supple, symmetrical, trachea midline and thyroid not enlarged, symmetric, no tenderness/mass/nodules Back: symmetric, no curvature. ROM normal. No CVA tenderness. Lungs: clear to auscultation bilaterally Heart: regular rate and rhythm,  S1, S2 normal, no murmur, click, rub or gallop Abdomen: soft, non-tender; bowel sounds normal; no masses,  no organomegaly Pulses: 2+ and symmetric Skin: Skin color, texture, turgor normal. No rashes or lesions Lymph nodes: Cervical, supraclavicular, and axillary nodes normal.  Lab Results  Component Value Date   HGBA1C 5.9 10/14/2015   HGBA1C 6.3 01/11/2015    Lab Results  Component Value Date   CREATININE 1.13* 11/22/2015   CREATININE 0.98 10/10/2015   CREATININE 1.06* 07/24/2015    Lab Results  Component Value Date   WBC 6.8 11/22/2015   HGB 13.2 11/22/2015   HCT 39.9 11/22/2015   PLT 217 11/22/2015   GLUCOSE 103* 11/22/2015   CHOL 174 04/22/2015   TRIG 82.0 04/22/2015   HDL 61.00 04/22/2015   LDLCALC 97 04/22/2015   ALT 17 10/10/2015   AST 17 10/10/2015   NA 139 11/22/2015   K 4.2 11/22/2015   CL 107 11/22/2015   CREATININE 1.13* 11/22/2015   BUN 17 11/22/2015   CO2 27 11/22/2015   TSH 2.03 04/22/2015   INR 1.6 11/05/2013   HGBA1C 5.9 10/14/2015    Dg Chest 2 View  11/22/2015  CLINICAL DATA:  Midsternal chest pain and tightness radiating to shoulders. Dry cough for a few days. History of atrial fibrillation, cardiomyopathy, hypertension. EXAM: CHEST  2 VIEW COMPARISON:  Chest x-rays dated 09/14/2014 and 11/05/2013. FINDINGS: Cardiomegaly, moderate in degree, is unchanged. Pulmonary vasculature is within normal limits. No evidence of active congestive heart failure. Lungs are clear. No pleural effusions seen. No pneumothorax. Incidental note is made of a small opacity in the left costophrenic angle region which is stable dating back to 2014, indicating benignity, possibly nodular scarring. Osseous and soft tissue structures about the chest are unremarkable. Mild degenerative change again noted throughout the scoliotic thoracolumbar spine. IMPRESSION: Cardiomegaly, stable. Stable chest x-ray. No evidence of acute cardiopulmonary abnormality Electronically Signed   By:  Franki Cabot M.D.   On: 11/22/2015 11:15    Assessment & Plan:   Problem List Items Addressed This Visit    Anxiety and depression    With nighttime insomnia and tension headaches.  Trial of low dose valium      Acute bronchitis - Primary    Resolving.  Continue prednisone taper, cough suppressants.         A total of 25 minutes of face to face time was spent with patient more than half of which was spent in counselling about the above mentioned conditions  and coordination of care  I have discontinued Ms. Comella's Multiple Vitamins-Minerals (CENTRUM ADULTS PO), oxybutynin, naproxen, and predniSONE. I am also having her start on predniSONE and diazepam. Additionally, I am having her maintain her apixaban, amiodarone, Vitamin D3, pravastatin, conjugated estrogens, HYDROcodone-acetaminophen, pantoprazole, and amLODipine.  Meds ordered this encounter  Medications  . predniSONE (DELTASONE) 10 MG tablet  Sig: 6 tablets on Day 1 , then reduce by 1 tablet daily until gone    Dispense:  21 tablet    Refill:  0  . diazepam (VALIUM) 5 MG tablet    Sig: Take 1 tablet (5 mg total) by mouth at bedtime as needed and may repeat dose one time if needed for anxiety.    Dispense:  30 tablet    Refill:  0    Medications Discontinued During This Encounter  Medication Reason  . Multiple Vitamins-Minerals (CENTRUM ADULTS PO) Completed Course  . oxybutynin (DITROPAN-XL) 5 MG 24 hr tablet Completed Course  . predniSONE (DELTASONE) 20 MG tablet   . naproxen (NAPROSYN) 500 MG tablet     Follow-up: No Follow-up on file.   [name], MD

## 2015-11-26 NOTE — Telephone Encounter (Signed)
appt scheduled

## 2015-11-26 NOTE — Progress Notes (Signed)
Pre visit review using our clinic review tool, if applicable. No additional management support is needed unless otherwise documented below in the visit note. 

## 2015-11-27 DIAGNOSIS — I10 Essential (primary) hypertension: Secondary | ICD-10-CM | POA: Diagnosis not present

## 2015-11-27 DIAGNOSIS — N183 Chronic kidney disease, stage 3 (moderate): Secondary | ICD-10-CM | POA: Diagnosis not present

## 2015-11-29 ENCOUNTER — Encounter: Payer: Self-pay | Admitting: Internal Medicine

## 2015-11-29 DIAGNOSIS — J209 Acute bronchitis, unspecified: Secondary | ICD-10-CM | POA: Insufficient documentation

## 2015-11-29 NOTE — Assessment & Plan Note (Signed)
With nighttime insomnia and tension headaches.  Trial of low dose valium

## 2015-11-29 NOTE — Assessment & Plan Note (Signed)
Resolving.  Continue prednisone taper, cough suppressants.

## 2015-12-03 DIAGNOSIS — N183 Chronic kidney disease, stage 3 (moderate): Secondary | ICD-10-CM | POA: Diagnosis not present

## 2015-12-12 ENCOUNTER — Ambulatory Visit (INDEPENDENT_AMBULATORY_CARE_PROVIDER_SITE_OTHER): Payer: Medicare Other | Admitting: Internal Medicine

## 2015-12-12 ENCOUNTER — Encounter: Payer: Self-pay | Admitting: Internal Medicine

## 2015-12-12 VITALS — BP 122/70 | HR 54 | Temp 97.7°F | Resp 12 | Ht 63.0 in | Wt 158.5 lb

## 2015-12-12 DIAGNOSIS — F418 Other specified anxiety disorders: Secondary | ICD-10-CM

## 2015-12-12 DIAGNOSIS — E2839 Other primary ovarian failure: Secondary | ICD-10-CM | POA: Diagnosis not present

## 2015-12-12 DIAGNOSIS — R7301 Impaired fasting glucose: Secondary | ICD-10-CM

## 2015-12-12 DIAGNOSIS — J208 Acute bronchitis due to other specified organisms: Secondary | ICD-10-CM

## 2015-12-12 DIAGNOSIS — E559 Vitamin D deficiency, unspecified: Secondary | ICD-10-CM | POA: Diagnosis not present

## 2015-12-12 DIAGNOSIS — K219 Gastro-esophageal reflux disease without esophagitis: Secondary | ICD-10-CM

## 2015-12-12 DIAGNOSIS — Z0000 Encounter for general adult medical examination without abnormal findings: Secondary | ICD-10-CM | POA: Diagnosis not present

## 2015-12-12 DIAGNOSIS — E785 Hyperlipidemia, unspecified: Secondary | ICD-10-CM

## 2015-12-12 DIAGNOSIS — F329 Major depressive disorder, single episode, unspecified: Secondary | ICD-10-CM

## 2015-12-12 DIAGNOSIS — F32A Depression, unspecified: Secondary | ICD-10-CM

## 2015-12-12 DIAGNOSIS — F419 Anxiety disorder, unspecified: Secondary | ICD-10-CM

## 2015-12-12 DIAGNOSIS — I10 Essential (primary) hypertension: Secondary | ICD-10-CM

## 2015-12-12 NOTE — Progress Notes (Signed)
Pre-visit discussion using our clinic review tool. No additional management support is needed unless otherwise documented below in the visit note.  

## 2015-12-12 NOTE — Patient Instructions (Signed)

## 2015-12-15 ENCOUNTER — Encounter: Payer: Self-pay | Admitting: Internal Medicine

## 2015-12-15 DIAGNOSIS — Z0000 Encounter for general adult medical examination without abnormal findings: Secondary | ICD-10-CM | POA: Insufficient documentation

## 2015-12-15 NOTE — Progress Notes (Signed)
Patient ID: [name], female    DOB: [birth year]  Age: 79 y.o. MRN: [name]  The patient is here for annual Medicare wellness examination and management of other chronic and acute problems.   Normal colonoscopy, Oh in 2015, normal EGD, Hiatal hernia, Oh 2015 S/p TAH/BSO now with cystocele,  Now on Detrol and premarin annual pelvic exam  by DeFrancesco 2015 S/p prophylactic subQ bilateral  mastectomy with silicone implants, normal mammogram,  May 2015 DEXA ordered Dec 2015, never done ECHO Nov 2016, EF 60%, mild  pulm htn [place])    The risk factors are reflected in the social history.  The roster of all physicians providing medical care to patient - is listed in the Snapshot section of the chart.  Activities of daily living:  The patient is 100% independent in all ADLs: dressing, toileting, feeding as well as independent mobility  Home safety : The patient has smoke detectors in the home. They wear seatbelts.  There are no firearms at home. There is no violence in the home.   There is no risks for hepatitis, STDs or HIV. There is no   history of blood transfusion. They have no travel history to infectious disease endemic areas of the world.  The patient has seen their dentist in the last six month. They have seen their eye doctor in the last year. They admit to slight hearing difficulty with regard to whispered voices and some television programs.  They have deferred audiologic testing in the last year.  They do not  have excessive sun exposure. Discussed the need for sun protection: hats, long sleeves and use of sunscreen if there is significant sun exposure.   Diet: the importance of a healthy diet is discussed. They do have a healthy diet.  The benefits of regular aerobic exercise were discussed. She walks 4 times per week ,  20 minutes.   Depression screen: there are no signs or vegative symptoms of depression- irritability, change in appetite, anhedonia,  sadness/tearfullness.  Cognitive assessment: the patient manages all their financial and personal affairs and is actively engaged. They could relate day,date,year and events; recalled 2/3 objects at 3 minutes; performed clock-face test normally.  The following portions of the patient's history were reviewed and updated as appropriate: allergies, current medications, past family history, past medical history,  past surgical history, past social history  and problem list.  Visual acuity was not assessed per patient preference since she has regular follow up with her ophthalmologist. Hearing and body mass index were assessed and reviewed.   During the course of the visit the patient was educated and counseled about appropriate screening and preventive services including : fall prevention , diabetes screening, nutrition counseling, colorectal cancer screening, and recommended immunizations.    CC: The primary encounter diagnosis was Estrogen deficiency. Diagnoses of Visit for preventive health examination, Vitamin D deficiency, Acute bronchitis due to other specified organisms, Anxiety and depression, Impaired fasting glucose, Hyperlipidemia, Gastroesophageal reflux disease without esophagitis, and Essential hypertension were also pertinent to this visit.  Bronchitis now resolved after taking prednisone taper.  No new issues  History Emarie has a past medical history of Atrial fibrillation ([place]); Cardiomyopathy; Dyspnea; Headache(784.0); Hypertension; Pre-syncope; Chronic kidney disease; Light headedness; Atrial flutter ([place]) (02/2011); Hyperlipidemia; Knee fracture; and Cystocele.   She has past surgical history that includes Cardioversion; Cholecystectomy; Appendectomy; Multiple orthopedic procedures; Mastectomy (1986); Cardiac catheterization; Combined augmentation mammaplasty and abdominoplasty; Abdominal hysterectomy (1990); Total knee arthroplasty (Left); Cardioversion; Augmentation mammaplasty  (Bilateral,  1986); Augmentation mammaplasty (1990); and Augmentation mammaplasty (2011).   Her family history includes Breast cancer in her maternal grandmother, sister, sister, and sister; Cancer in her father, maternal grandmother, sister, and sister; Heart disease in her mother and son. There is no history of Diabetes.She reports that she has never smoked. She has never used smokeless tobacco. She reports that she does not drink alcohol or use illicit drugs.  Outpatient Prescriptions Prior to Visit  Medication Sig Dispense Refill  . amiodarone (PACERONE) 100 MG tablet Take 1 tablet (100 mg total) by mouth daily. 30 tablet 5  . amLODipine (NORVASC) 2.5 MG tablet Take 2 tablets (5 mg total) by mouth daily. 60 tablet 6  . apixaban (ELIQUIS) 5 MG TABS tablet Take 1 tablet (5 mg total) by mouth 2 (two) times daily. 60 tablet 6  . Cholecalciferol (VITAMIN D3) 1000 UNITS CAPS Take 1 capsule by mouth daily.    [name] conjugated estrogens (PREMARIN) vaginal cream 1/2-1 gram intravaginal BIW 60 g 1  . diazepam (VALIUM) 5 MG tablet Take 1 tablet (5 mg total) by mouth at bedtime as needed and may repeat dose one time if needed for anxiety. 30 tablet 0  . HYDROcodone-acetaminophen (NORCO/VICODIN) 5-325 MG tablet Take 1 tablet by mouth every 6 (six) hours as needed for moderate pain. 30 tablet 0  . pantoprazole (PROTONIX) 20 MG tablet Take 1 tablet (20 mg total) by mouth 2 (two) times daily. 60 tablet 1  . pravastatin (PRAVACHOL) 20 MG tablet TAKE ONE (1) TABLET BY MOUTH EVERY DAY 90 tablet 2  . predniSONE (DELTASONE) 10 MG tablet 6 tablets on Day 1 , then reduce by 1 tablet daily until gone (Patient not taking: Reported on 12/12/2015) 21 tablet 0   No facility-administered medications prior to visit.    Review of Systems  Patient denies headache, fevers, malaise, unintentional weight loss, skin rash, eye pain, sinus congestion and sinus pain, sore throat, dysphagia,  hemoptysis , cough, dyspnea, wheezing,  chest pain, palpitations, orthopnea, edema, abdominal pain, nausea, melena, diarrhea, constipation, flank pain, dysuria, hematuria, urinary  Frequency, nocturia, numbness, tingling, seizures,  Focal weakness, Loss of consciousness,  Tremor, insomnia, depression, anxiety, and suicidal ideation.     Objective:  BP 122/70 mmHg  Pulse 54  Temp(Src) 97.7 F (36.5 C) (Oral)  Resp 12  Ht 5\' 3"  (1.6 m)  Wt 158 lb 8 oz (71.895 kg)  BMI 28.08 kg/m2  SpO2 97%  Physical Exam   General appearance: alert, cooperative and appears stated age Head: Normocephalic, without obvious abnormality, atraumatic Eyes: conjunctivae/corneas clear. PERRL, EOM's intact. Fundi benign. Ears: normal TM's and external ear canals both ears Nose: Nares normal. Septum midline. Mucosa normal. No drainage or sinus tenderness. Throat: lips, mucosa, and tongue normal; teeth and gums normal Neck: no adenopathy, no carotid bruit, no JVD, supple, symmetrical, trachea midline and thyroid not enlarged, symmetric, no tenderness/mass/nodules Lungs: clear to auscultation bilaterally Breasts: normal appearance, no masses or tenderness Heart: regular rate and rhythm, S1, S2 normal, no murmur, click, rub or gallop Abdomen: soft, non-tender; bowel sounds normal; no masses,  no organomegaly Extremities: extremities normal, atraumatic, no cyanosis or edema Pulses: 2+ and symmetric Skin: Skin color, texture, turgor normal. No rashes or lesions Neurologic: Alert and oriented X 3, normal strength and tone. Normal symmetric reflexes. Normal coordination and gait.     Assessment & Plan:   Problem List Items Addressed This Visit    Hyperlipidemia    Tolerating  Pravastatin. LDL and triglycerides  are at goal on current medications. sHe has no side effects and liver enzymes are normal. No changes today   Lab Results  Component Value Date   CHOL 174 04/22/2015   HDL 61.00 04/22/2015   LDLCALC 97 04/22/2015   TRIG 82.0 04/22/2015    CHOLHDL 3 04/22/2015   Lab Results  Component Value Date   ALT 17 10/10/2015   AST 17 10/10/2015   ALKPHOS 58 10/10/2015   BILITOT 0.7 10/10/2015         Essential hypertension    Well controlled on current regimen.Mild bump in renal function noted earlier in month likely related to volume status,. She is not on a diuretic, and lisinopril was stopped. no changes today.  Lab Results  Component Value Date   CREATININE 1.13* 11/22/2015   Lab Results  Component Value Date   NA 139 11/22/2015   K 4.2 11/22/2015   CL 107 11/22/2015   CO2 27 11/22/2015         Anxiety and depression    With insomnia and daily headache, aggravated by health concerns, including husband Harold's battle with lymphoma.  Improved with prn valium.       Vitamin D deficiency    Resolved with megadose of D3 per October labs, recommended daily intake of 2000 IUs D3  until April.       Impaired fasting glucose    I have addressed  BMI and recommended wt loss of 10% of body weight over the next 6 months using a low fat, fruit/vegetable based Mediteranean diet and regular exercise a minimum of 5 days per week.   Lab Results  Component Value Date   HGBA1C 5.9 10/14/2015         GERD (gastroesophageal reflux disease)    With EGD done Nov 2015 for hematemsis while on anticoagulation.  No barrett's, noted. continue protonix      Acute bronchitis    Resolved  After steroid taper.       Visit for preventive health examination    Annual comprehensive preventive exam was done as well as an evaluation and management of acute and chronic conditions .  During the course of the visit the patient was educated and counseled about appropriate screening and preventive services including :  diabetes screening, lipid analysis with projected  10 year  risk for CAD , nutrition counseling, colorectal cancer screening, and recommended immunizations.  Printed recommendations for health maintenance screenings was given.    Lab Results  Component Value Date   HGBA1C 5.9 10/14/2015   Lab Results  Component Value Date   CHOL 174 04/22/2015   HDL 61.00 04/22/2015   LDLCALC 97 04/22/2015   TRIG 82.0 04/22/2015   CHOLHDL 3 04/22/2015          Other Visit Diagnoses    Estrogen deficiency    -  Primary    Relevant Orders    DG Bone Density       I have discontinued Ms. Schobert's predniSONE. I am also having her maintain her apixaban, amiodarone, Vitamin D3, pravastatin, conjugated estrogens, HYDROcodone-acetaminophen, pantoprazole, amLODipine, and diazepam.  No orders of the defined types were placed in this encounter.    Medications Discontinued During This Encounter  Medication Reason  . predniSONE (DELTASONE) 10 MG tablet     Follow-up: Return in about 4 weeks (around 01/09/2016).   [name], MD

## 2015-12-15 NOTE — Addendum Note (Signed)
Addended by: [name] on: 12/15/2015 12:24 PM   Modules accepted: Miquel Dunn

## 2015-12-15 NOTE — Assessment & Plan Note (Signed)
Annual comprehensive preventive exam was done as well as an evaluation and management of acute and chronic conditions .  During the course of the visit the patient was educated and counseled about appropriate screening and preventive services including :  diabetes screening, lipid analysis with projected  10 year  risk for CAD , nutrition counseling, colorectal cancer screening, and recommended immunizations.  Printed recommendations for health maintenance screenings was given.   Lab Results  Component Value Date   HGBA1C 5.9 10/14/2015   Lab Results  Component Value Date   CHOL 174 04/22/2015   HDL 61.00 04/22/2015   LDLCALC 97 04/22/2015   TRIG 82.0 04/22/2015   CHOLHDL 3 04/22/2015

## 2015-12-15 NOTE — Assessment & Plan Note (Signed)
Resolved with megadose of D3 per October labs, recommended daily intake of 2000 IUs D3  until April.

## 2015-12-15 NOTE — Assessment & Plan Note (Signed)
I have addressed  BMI and recommended wt loss of 10% of body weight over the next 6 months using a low fat, fruit/vegetable based Mediteranean diet and regular exercise a minimum of 5 days per week.   Lab Results  Component Value Date   HGBA1C 5.9 10/14/2015

## 2015-12-15 NOTE — Assessment & Plan Note (Addendum)
Well controlled on current regimen.Mild bump in renal function noted earlier in month likely related to volume status,. She is not on a diuretic, and lisinopril was stopped. no changes today.  Lab Results  Component Value Date   CREATININE 1.13* 11/22/2015   Lab Results  Component Value Date   NA 139 11/22/2015   K 4.2 11/22/2015   CL 107 11/22/2015   CO2 27 11/22/2015

## 2015-12-15 NOTE — Assessment & Plan Note (Addendum)
With insomnia and daily headache, aggravated by health concerns, including husband Harold's battle with lymphoma.  Improved with prn valium.

## 2015-12-15 NOTE — Assessment & Plan Note (Signed)
Tolerating  Pravastatin. LDL and triglycerides are at goal on current medications. sHe has no side effects and liver enzymes are normal. No changes today   Lab Results  Component Value Date   CHOL 174 04/22/2015   HDL 61.00 04/22/2015   LDLCALC 97 04/22/2015   TRIG 82.0 04/22/2015   CHOLHDL 3 04/22/2015   Lab Results  Component Value Date   ALT 17 10/10/2015   AST 17 10/10/2015   ALKPHOS 58 10/10/2015   BILITOT 0.7 10/10/2015

## 2015-12-15 NOTE — Assessment & Plan Note (Signed)
Resolved  After steroid taper.

## 2015-12-15 NOTE — Assessment & Plan Note (Signed)
With EGD done Nov 2015 for hematemsis while on anticoagulation.  No barrett's, noted. continue protonix

## 2015-12-17 ENCOUNTER — Other Ambulatory Visit: Payer: Self-pay

## 2015-12-17 ENCOUNTER — Telehealth: Payer: Self-pay

## 2015-12-17 MED ORDER — AMLODIPINE BESYLATE 5 MG PO TABS: 5.0000 mg | ORAL_TABLET | Freq: Every day | ORAL | Status: DC

## 2015-12-17 NOTE — Telephone Encounter (Signed)
S/w pt who pt who states she has been taking amlodipine 2.5mg  in the morning and 5mg  in the evening to keep her BP under control. She checks it regularly and states adding extra 2.5mg  has been effective. She would like refill on amlodipine. Advised pt to take medication as directed and I will make Dr. Fletcher Anon aware of her need to increase dosage. Forward to MD for review and recommendations.

## 2015-12-18 ENCOUNTER — Encounter: Payer: Self-pay | Admitting: Internal Medicine

## 2015-12-24 ENCOUNTER — Ambulatory Visit: Payer: Medicare Other | Admitting: Obstetrics and Gynecology

## 2015-12-28 NOTE — Telephone Encounter (Signed)
I am ok with increasing Amlodipine and using it as described.

## 2015-12-29 ENCOUNTER — Ambulatory Visit (INDEPENDENT_AMBULATORY_CARE_PROVIDER_SITE_OTHER): Payer: Medicare Other | Admitting: Nurse Practitioner

## 2015-12-29 ENCOUNTER — Encounter: Payer: Self-pay | Admitting: Nurse Practitioner

## 2015-12-29 VITALS — BP 118/70 | HR 58 | Temp 97.4°F | Resp 18 | Ht 63.0 in | Wt 160.5 lb

## 2015-12-29 DIAGNOSIS — J069 Acute upper respiratory infection, unspecified: Secondary | ICD-10-CM

## 2015-12-29 DIAGNOSIS — B9789 Other viral agents as the cause of diseases classified elsewhere: Principal | ICD-10-CM

## 2015-12-29 MED ORDER — AMOXICILLIN 500 MG PO CAPS: 500.0000 mg | ORAL_CAPSULE | Freq: Three times a day (TID) | ORAL | Status: DC

## 2015-12-29 MED ORDER — FLUTICASONE PROPIONATE 50 MCG/ACT NA SUSP: 2.0000 | Freq: Every day | NASAL | Status: DC

## 2015-12-29 NOTE — Assessment & Plan Note (Signed)
Recurrence. Worsening.  Pt has been treated with prednisone taper recently Continue her OTC measures Start flonase nasal spray daily  Benadryl OTC at night for symptoms If no improvement in 48 hours- amoxicillin 3 times daily PO Encouraged probiotics FU prn worsening/failure to improve.

## 2015-12-29 NOTE — Progress Notes (Signed)
Patient ID: [name] [name], female    DOB: 1936-02-17  Age: 80 y.o. MRN: [name]  CC: URI   HPI [name] [name] presents for CC of cough x 4 days.   1) Worse since Friday after drainage  Cough- yellow production- small amount Sick contacts: Denies Treatment recently: Cough drops Robitussin OTC Coricidin HBP Sinus rinse this morning  Pt seen by Dr. Derrel Nip on 12/12/15 for physical and follow up of bronchitis, which she treated for 11/26/15. Also treated in the ER and by Dr. Lacinda Axon on 11/20/2015.  Prednisone taper  No fever today, O2 level is adequate   History Irine has a past medical history of Atrial fibrillation ([place]); Cardiomyopathy; Dyspnea; Headache(784.0); Hypertension; Pre-syncope; Chronic kidney disease; Light headedness; Atrial flutter ([place]) (02/2011); Hyperlipidemia; Knee fracture; and Cystocele.   She has past surgical history that includes Cardioversion; Cholecystectomy; Appendectomy; Multiple orthopedic procedures; Mastectomy (1986); Cardiac catheterization; Combined augmentation mammaplasty and abdominoplasty; Abdominal hysterectomy (1990); Total knee arthroplasty (Left); Cardioversion; Augmentation mammaplasty (Bilateral, 1986); Augmentation mammaplasty (1990); and Augmentation mammaplasty (2011).   Her family history includes Breast cancer in her maternal grandmother, sister, sister, and sister; Cancer in her father, maternal grandmother, sister, and sister; Heart disease in her mother and son. There is no history of Diabetes.She reports that she has never smoked. She has never used smokeless tobacco. She reports that she does not drink alcohol or use illicit drugs.  Outpatient Prescriptions Prior to Visit  Medication Sig Dispense Refill  . amiodarone (PACERONE) 100 MG tablet Take 1 tablet (100 mg total) by mouth daily. 30 tablet 5  . amLODipine (NORVASC) 5 MG tablet Take 1 tablet (5 mg total) by mouth daily. 60 tablet 5  . apixaban (ELIQUIS) 5 MG TABS tablet Take 1  tablet (5 mg total) by mouth 2 (two) times daily. 60 tablet 6  . Cholecalciferol (VITAMIN D3) 1000 UNITS CAPS Take 1 capsule by mouth daily.    [name] conjugated estrogens (PREMARIN) vaginal cream 1/2-1 gram intravaginal BIW 60 g 1  . diazepam (VALIUM) 5 MG tablet Take 1 tablet (5 mg total) by mouth at bedtime as needed and may repeat dose one time if needed for anxiety. 30 tablet 0  . HYDROcodone-acetaminophen (NORCO/VICODIN) 5-325 MG tablet Take 1 tablet by mouth every 6 (six) hours as needed for moderate pain. 30 tablet 0  . pantoprazole (PROTONIX) 20 MG tablet Take 1 tablet (20 mg total) by mouth 2 (two) times daily. 60 tablet 1  . pravastatin (PRAVACHOL) 20 MG tablet TAKE ONE (1) TABLET BY MOUTH EVERY DAY 90 tablet 2   No facility-administered medications prior to visit.    ROS Review of Systems  Constitutional: Negative for fever, chills, diaphoresis and fatigue.  HENT: Positive for congestion, postnasal drip, rhinorrhea, sneezing and sore throat. Negative for ear pain and sinus pressure.   Eyes: Positive for discharge. Negative for pain, redness and itching.       Watery eyes  Respiratory: Positive for cough. Negative for chest tightness, shortness of breath and wheezing.   Cardiovascular: Negative for chest pain, palpitations and leg swelling.  Gastrointestinal: Negative for nausea, vomiting and diarrhea.  Skin: Negative for rash.  Neurological: Negative for dizziness, weakness, numbness and headaches.  Psychiatric/Behavioral: The patient is not nervous/anxious.     Objective:  BP 118/70 mmHg  Pulse 58  Temp(Src) 97.4 F (36.3 C) (Oral)  Resp 18  Ht 5\' 3"  (1.6 m)  Wt 160 lb 8 oz (72.802 kg)  BMI 28.44 kg/m2  SpO2  97%  Physical Exam  Constitutional: She is oriented to person, place, and time. She appears well-developed and well-nourished. No distress.  HENT:  Head: Normocephalic and atraumatic.  Right Ear: External ear normal.  Left Ear: External ear normal.  Mouth/Throat:  No oropharyngeal exudate.  Cardiovascular: Normal rate, regular rhythm and normal heart sounds.  Exam reveals no gallop and no friction rub.   No murmur heard. Pulmonary/Chest: Effort normal and breath sounds normal. No respiratory distress. She has no wheezes. She has no rales. She exhibits no tenderness.  Neurological: She is alert and oriented to person, place, and time. No cranial nerve deficit. She exhibits normal muscle tone. Coordination normal.  Skin: Skin is warm and dry. No rash noted. She is not diaphoretic.  Psychiatric: She has a normal mood and affect. Her behavior is normal. Judgment and thought content normal.   Assessment & Plan:   Shailah was seen today for uri.  Diagnoses and all orders for this visit:  Viral URI with cough  Other orders -     fluticasone (FLONASE) 50 MCG/ACT nasal spray; Place 2 sprays into both nostrils daily. -     amoxicillin (AMOXIL) 500 MG capsule; Take 1 capsule (500 mg total) by mouth 3 (three) times daily.  I am having Ms. Treaster start on fluticasone and amoxicillin. I am also having her maintain her apixaban, amiodarone, Vitamin D3, pravastatin, conjugated estrogens, HYDROcodone-acetaminophen, pantoprazole, diazepam, and amLODipine.  Meds ordered this encounter  Medications  . fluticasone (FLONASE) 50 MCG/ACT nasal spray    Sig: Place 2 sprays into both nostrils daily.    Dispense:  16 g    Refill:  0    Order Specific Question:  Supervising Provider    Answer:  Derrel Nip, TERESA L [[id number]]  . amoxicillin (AMOXIL) 500 MG capsule    Sig: Take 1 capsule (500 mg total) by mouth 3 (three) times daily.    Dispense:  15 capsule    Refill:  0    Order Specific Question:  Supervising Provider    Answer:  [name] [[id number]]     Follow-up: Return if symptoms worsen or fail to improve.

## 2015-12-29 NOTE — Patient Instructions (Signed)
This is probably a virus. The post-nasal drip from the drainage is causing your cough.  I also advise the use of the following OTC meds to help with your other symptoms.   Take generic OTC benadryl 25 mg  At bedtime for the drainage, Delsym for daytime cough. flush your sinuses twice daily with Simply Saline (remember to clean the tip to keep from re-infecting yourself)  If the coughing has not impoved in  48 hours  OR  if you develop T > 100.4,  Green nasal discharge, or facial pain, start the antibiotic.  Please take a probiotic ( Align, Floraque or Culturelle) while you are on the antibiotic to prevent a serious antibiotic associated diarrhea  Called clostridium dificile colitis and a vaginal yeast infection

## 2015-12-31 DIAGNOSIS — I10 Essential (primary) hypertension: Secondary | ICD-10-CM | POA: Diagnosis not present

## 2015-12-31 DIAGNOSIS — E7800 Pure hypercholesterolemia, unspecified: Secondary | ICD-10-CM | POA: Diagnosis not present

## 2015-12-31 DIAGNOSIS — N183 Chronic kidney disease, stage 3 (moderate): Secondary | ICD-10-CM | POA: Diagnosis not present

## 2015-12-31 DIAGNOSIS — N2581 Secondary hyperparathyroidism of renal origin: Secondary | ICD-10-CM | POA: Diagnosis not present

## 2016-01-01 ENCOUNTER — Telehealth: Payer: Self-pay

## 2016-01-01 ENCOUNTER — Telehealth: Payer: Self-pay | Admitting: Internal Medicine

## 2016-01-01 NOTE — Telephone Encounter (Signed)
I don't have the note from Dr. Holley Raring so I don't really know all the details about the reasons for starting it but it is a good blood pressure medication.

## 2016-01-01 NOTE — Telephone Encounter (Signed)
S/w Anderson Malta, nurse, at Dr. Elwyn Lade office who states enalapril 2.5mg  qd was added for renal protection. BP 134/64 at 1/12 OV.

## 2016-01-01 NOTE — Telephone Encounter (Signed)
S/w pt who stopped by the office. Informed her that Dr. Fletcher Anon states enalapril is a "good blood pressure medication". Advised pt to take all meds as prescribed, monitor BP, and notify us if she becomes hypotensive. Pt agreeable w/plan and will start enalapril today. Pt had no further questions.

## 2016-01-01 NOTE — Telephone Encounter (Signed)
S/w pt who states Dr. Holley Raring prescribed 2.5mg  enalapril at 1/11 OV w/instructions to check labs in one week. Pt is unsure why she is to take this medication and would like Dr. Fletcher Anon to be aware that it was added. Reports BP under control as she is taking 2.5mg  amlodipine in the morning and 5mg  in the evening.  Reading last evening: 130/67, HR 66 This morning: 122/60, HR 63 before taking morning meds. She has not taken enalapril and will hold until she hears from Dr. Fletcher Anon. I have left message for Anderson Malta, Dr. Elwyn Lade nurse.

## 2016-01-02 ENCOUNTER — Encounter: Payer: Self-pay | Admitting: Internal Medicine

## 2016-01-02 ENCOUNTER — Ambulatory Visit (INDEPENDENT_AMBULATORY_CARE_PROVIDER_SITE_OTHER): Payer: Medicare Other | Admitting: Internal Medicine

## 2016-01-02 VITALS — BP 110/64 | HR 55 | Temp 98.0°F | Resp 12 | Ht 63.0 in | Wt 160.4 lb

## 2016-01-02 DIAGNOSIS — J069 Acute upper respiratory infection, unspecified: Secondary | ICD-10-CM | POA: Diagnosis not present

## 2016-01-02 DIAGNOSIS — B9789 Other viral agents as the cause of diseases classified elsewhere: Principal | ICD-10-CM

## 2016-01-02 MED ORDER — PREDNISONE 10 MG PO TABS: ORAL_TABLET | ORAL | Status: DC

## 2016-01-02 NOTE — Progress Notes (Signed)
Pre-visit discussion using our clinic review tool. No additional management support is needed unless otherwise documented below in the visit note.  

## 2016-01-02 NOTE — Patient Instructions (Signed)
You have a viral syndrome which is causing drainage and cough.     I am prescribing a prednisone taper to manage the inflammation   I also advise use of the following OTC meds to help with your other symptoms.   Take generic OTC benadryl 25 mg  At bedtime for the drainage,,  Delsym for daytime cough. flush your sinuses twice daily with Simply Saline

## 2016-01-02 NOTE — Progress Notes (Signed)
Subjective:  Patient ID: [name] [name], female    DOB: [birth year]  Age: 80 y.o. MRN: [id number]  CC: The encounter diagnosis was Viral URI with cough.  HPI [name] [name] presents for right eye redness and itching and burning.  Treated recently for viral URI with cough by NP  On Jan 9th.  with amoxicillin but only took one day bc it made her woozy.  She denies any recent  fevers,  Just having sneezing ,  Coughing and rhinitis.  ,  now having headache and clear to yellow sinus drainage  Using saline flushes twice daily and benadryl .     Outpatient Prescriptions Prior to Visit  Medication Sig Dispense Refill  . amiodarone (PACERONE) 100 MG tablet Take 1 tablet (100 mg total) by mouth daily. 30 tablet 5  . amLODipine (NORVASC) 5 MG tablet Take 1 tablet (5 mg total) by mouth daily. 60 tablet 5  . apixaban (ELIQUIS) 5 MG TABS tablet Take 1 tablet (5 mg total) by mouth 2 (two) times daily. 60 tablet 6  . Cholecalciferol (VITAMIN D3) 1000 UNITS CAPS Take 1 capsule by mouth daily.    [name] conjugated estrogens (PREMARIN) vaginal cream 1/2-1 gram intravaginal BIW 60 g 1  . diazepam (VALIUM) 5 MG tablet Take 1 tablet (5 mg total) by mouth at bedtime as needed and may repeat dose one time if needed for anxiety. 30 tablet 0  . fluticasone (FLONASE) 50 MCG/ACT nasal spray Place 2 sprays into both nostrils daily. 16 g 0  . HYDROcodone-acetaminophen (NORCO/VICODIN) 5-325 MG tablet Take 1 tablet by mouth every 6 (six) hours as needed for moderate pain. 30 tablet 0  . pantoprazole (PROTONIX) 20 MG tablet Take 1 tablet (20 mg total) by mouth 2 (two) times daily. 60 tablet 1  . pravastatin (PRAVACHOL) 20 MG tablet TAKE ONE (1) TABLET BY MOUTH EVERY DAY 90 tablet 2  . amoxicillin (AMOXIL) 500 MG capsule Take 1 capsule (500 mg total) by mouth 3 (three) times daily. (Patient not taking: Reported on 01/02/2016) 15 capsule 0   No facility-administered medications prior to visit.    Review of Systems;  Patient  denies headache, fevers, malaise, unintentional weight loss, skin rash, eye pain, sinus congestion and sinus pain, sore throat, dysphagia,  hemoptysis , cough, dyspnea, wheezing, chest pain, palpitations, orthopnea, edema, abdominal pain, nausea, melena, diarrhea, constipation, flank pain, dysuria, hematuria, urinary  Frequency, nocturia, numbness, tingling, seizures,  Focal weakness, Loss of consciousness,  Tremor, insomnia, depression, anxiety, and suicidal ideation.      Objective:  BP 110/64 mmHg  Pulse 55  Temp(Src) 98 F (36.7 C) (Oral)  Resp 12  Ht 5\' 3"  (1.6 m)  Wt 160 lb 6 oz (72.746 kg)  BMI 28.42 kg/m2  SpO2 98%  BP Readings from Last 3 Encounters:  01/02/16 110/64  12/29/15 118/70  12/12/15 122/70    Wt Readings from Last 3 Encounters:  01/02/16 160 lb 6 oz (72.746 kg)  12/29/15 160 lb 8 oz (72.802 kg)  12/12/15 158 lb 8 oz (71.895 kg)    General appearance: alert, cooperative and appears stated age Ears: normal TM's and external ear canals both ears Throat: lips, mucosa, and tongue normal; teeth and gums normal Neck: no adenopathy, no carotid bruit, supple, symmetrical, trachea midline and thyroid not enlarged, symmetric, no tenderness/mass/nodules Back: symmetric, no curvature. ROM normal. No CVA tenderness. Lungs: clear to auscultation bilaterally Heart: regular rate and rhythm, S1, S2 normal, no murmur, click, rub or  gallop Abdomen: soft, non-tender; bowel sounds normal; no masses,  no organomegaly Pulses: 2+ and symmetric Skin: Skin color, texture, turgor normal. No rashes or lesions Lymph nodes: Cervical, supraclavicular, and axillary nodes normal.  Lab Results  Component Value Date   HGBA1C 5.9 10/14/2015   HGBA1C 6.3 01/11/2015    Lab Results  Component Value Date   CREATININE 1.13* 11/22/2015   CREATININE 0.98 10/10/2015   CREATININE 1.06* 07/24/2015    Lab Results  Component Value Date   WBC 6.8 11/22/2015   HGB 13.2 11/22/2015   HCT 39.9  11/22/2015   PLT 217 11/22/2015   GLUCOSE 103* 11/22/2015   CHOL 174 04/22/2015   TRIG 82.0 04/22/2015   HDL 61.00 04/22/2015   LDLCALC 97 04/22/2015   ALT 17 10/10/2015   AST 17 10/10/2015   NA 139 11/22/2015   K 4.2 11/22/2015   CL 107 11/22/2015   CREATININE 1.13* 11/22/2015   BUN 17 11/22/2015   CO2 27 11/22/2015   TSH 2.03 04/22/2015   INR 1.6 11/05/2013   HGBA1C 5.9 10/14/2015    Dg Chest 2 View  11/22/2015  CLINICAL DATA:  Midsternal chest pain and tightness radiating to shoulders. Dry cough for a few days. History of atrial fibrillation, cardiomyopathy, hypertension. EXAM: CHEST  2 VIEW COMPARISON:  Chest x-rays dated 09/14/2014 and 11/05/2013. FINDINGS: Cardiomegaly, moderate in degree, is unchanged. Pulmonary vasculature is within normal limits. No evidence of active congestive heart failure. Lungs are clear. No pleural effusions seen. No pneumothorax. Incidental note is made of a small opacity in the left costophrenic angle region which is stable dating back to 2014, indicating benignity, possibly nodular scarring. Osseous and soft tissue structures about the chest are unremarkable. Mild degenerative change again noted throughout the scoliotic thoracolumbar spine. IMPRESSION: Cardiomegaly, stable. Stable chest x-ray. No evidence of acute cardiopulmonary abnormality Electronically Signed   By: Franki Cabot M.D.   On: 11/22/2015 11:15    Assessment & Plan:   Problem List Items Addressed This Visit    Viral URI with cough - Primary    She has no signs of bacterial superinfection,.  Prescribing prednisone taper to help resolve congestion , inflammation and cough         I am having Ms. Holloran start on predniSONE. I am also having her maintain her apixaban, amiodarone, Vitamin D3, pravastatin, conjugated estrogens, HYDROcodone-acetaminophen, pantoprazole, diazepam, amLODipine, fluticasone, amoxicillin, and enalapril.  Meds ordered this encounter  Medications  . enalapril  (VASOTEC) 2.5 MG tablet    Sig: Take 2.5 mg by mouth daily.  . predniSONE (DELTASONE) 10 MG tablet    Sig: 6 tablets daily for 1 day,  Then taper by 1 tablet daily until gone    Dispense:  21 tablet    Refill:  0    There are no discontinued medications.  Follow-up: No Follow-up on file.   [name], MD

## 2016-01-04 NOTE — Assessment & Plan Note (Signed)
She has no signs of bacterial superinfection,.  Prescribing prednisone taper to help resolve congestion , inflammation and cough

## 2016-01-06 DIAGNOSIS — H1131 Conjunctival hemorrhage, right eye: Secondary | ICD-10-CM | POA: Diagnosis not present

## 2016-01-08 ENCOUNTER — Ambulatory Visit
Admission: RE | Admit: 2016-01-08 | Discharge: 2016-01-08 | Disposition: A | Discharge status: Home / Self Care | Payer: Medicare Other | Source: Ambulatory Visit | Attending: Internal Medicine | Admitting: Internal Medicine

## 2016-01-08 DIAGNOSIS — M85851 Other specified disorders of bone density and structure, right thigh: Secondary | ICD-10-CM | POA: Diagnosis not present

## 2016-01-08 DIAGNOSIS — E2839 Other primary ovarian failure: Secondary | ICD-10-CM | POA: Diagnosis not present

## 2016-01-08 DIAGNOSIS — Z780 Asymptomatic menopausal state: Secondary | ICD-10-CM | POA: Insufficient documentation

## 2016-01-10 ENCOUNTER — Encounter: Payer: Self-pay | Admitting: Internal Medicine

## 2016-01-10 LAB — HM DEXA SCAN

## 2016-01-12 ENCOUNTER — Other Ambulatory Visit: Payer: Self-pay | Admitting: *Deleted

## 2016-01-12 ENCOUNTER — Encounter: Payer: Self-pay | Admitting: *Deleted

## 2016-01-12 MED ORDER — AMIODARONE HCL 100 MG PO TABS: 100.0000 mg | ORAL_TABLET | Freq: Every day | ORAL | Status: DC

## 2016-01-20 ENCOUNTER — Ambulatory Visit (INDEPENDENT_AMBULATORY_CARE_PROVIDER_SITE_OTHER): Payer: Medicare Other | Admitting: Family Medicine

## 2016-01-20 ENCOUNTER — Encounter: Payer: Self-pay | Admitting: Family Medicine

## 2016-01-20 VITALS — BP 118/58 | HR 60 | Temp 97.7°F | Ht 63.0 in | Wt 161.0 lb

## 2016-01-20 DIAGNOSIS — H5789 Other specified disorders of eye and adnexa: Secondary | ICD-10-CM | POA: Insufficient documentation

## 2016-01-20 DIAGNOSIS — H578 Other specified disorders of eye and adnexa: Secondary | ICD-10-CM | POA: Diagnosis not present

## 2016-01-20 DIAGNOSIS — B349 Viral infection, unspecified: Secondary | ICD-10-CM | POA: Diagnosis not present

## 2016-01-20 DIAGNOSIS — B9789 Other viral agents as the cause of diseases classified elsewhere: Secondary | ICD-10-CM

## 2016-01-20 DIAGNOSIS — J988 Other specified respiratory disorders: Principal | ICD-10-CM

## 2016-01-20 MED ORDER — OLOPATADINE HCL 0.2 % OP SOLN: OPHTHALMIC | Status: DC

## 2016-01-20 NOTE — Progress Notes (Addendum)
Subjective:  Patient ID: [name] [name], female    DOB: 11-12-1936  Age: 80 y.o. MRN: [name]  CC: Congestion, Eye itching  HPI:  80 year old female with a past medical history of hypertension, hyperlipidemia, A. fib, pulmonary hypertension presents to clinic today with the above complaints.  Patient continues to have trouble with congestion. She reports associated cough that is mildly productive. She has had several respiratory illnesses in the past month. She's been treated on 3 occasions for bronchitis as well as viral URIs. She states that she just can't seem to get over this. She continues to have significant congestion and cough. No exacerbating or relieving factors. No reports of shortness of breath.  Yesterday the patient developed itching of left eye and associated blurry vision. She states that she's had some gunk in her eye but states that it was not matted this morning. She states that she is concerned that she may have conjunctivitis. Her husband has a similar illness.    Social Hx   Social History   Social History  . Marital Status: Married    Spouse Name: N/A  . Number of Children: 1  . Years of Education: N/A   Occupational History  .     Social History Main Topics  . Smoking status: Never Smoker   . Smokeless tobacco: Never Used  . Alcohol Use: No  . Drug Use: No  . Sexual Activity: No   Other Topics Concern  . None   Social History Narrative   Review of Systems  Constitutional: Negative.   HENT: Positive for congestion.   Eyes: Positive for itching.   Objective:  BP 118/58 mmHg  Pulse 60  Temp(Src) 97.7 F (36.5 C) (Oral)  Ht 5\' 3"  (1.6 m)  Wt 161 lb (73.029 kg)  BMI 28.53 kg/m2  SpO2 98%  BP/Weight 01/20/2016 [id number]  Systolic BP 123456 [id number]  Diastolic BP 58 64 70  Wt. (Lbs) 161 160.38 160.5  BMI 28.53 28.42 28.44   Physical Exam  Constitutional: She appears well-developed. No distress.  HENT:  Head: Normocephalic and  atraumatic.  Normal TMs bilaterally.  Eyes: Conjunctivae are normal. Right eye exhibits no discharge. Left eye exhibits no discharge.  Cardiovascular: Normal rate.   Pulmonary/Chest: Effort normal and breath sounds normal.  Neurological: She is alert.  Psychiatric: She has a normal mood and affect.  Vitals reviewed.  Lab Results  Component Value Date   WBC 6.8 11/22/2015   HGB 13.2 11/22/2015   HCT 39.9 11/22/2015   PLT 217 11/22/2015   GLUCOSE 103* 11/22/2015   CHOL 174 04/22/2015   TRIG 82.0 04/22/2015   HDL 61.00 04/22/2015   LDLCALC 97 04/22/2015   ALT 17 10/10/2015   AST 17 10/10/2015   NA 139 11/22/2015   K 4.2 11/22/2015   CL 107 11/22/2015   CREATININE 1.13* 11/22/2015   BUN 17 11/22/2015   CO2 27 11/22/2015   TSH 2.03 04/22/2015   INR 1.6 11/05/2013   HGBA1C 5.9 10/14/2015    Assessment & Plan:   Problem List Items Addressed This Visit    Itch of left eye    New problem.  Suspect viral etiology given other symptoms. Treating with Pataday.      Viral respiratory infection - Primary    Patient with continued respiratory complaints. This is likely viral in origin. No indication for antibiotics. Supportive care.         Meds ordered this encounter  Medications  .  Olopatadine HCl 0.2 % SOLN    Sig: 1 drop to affected eye daily.    Dispense:  2.5 mL    Refill:  0   Follow-up: PRN  [place]

## 2016-01-20 NOTE — Progress Notes (Signed)
Pre visit review using our clinic review tool, if applicable. No additional management support is needed unless otherwise documented below in the visit note. 

## 2016-01-20 NOTE — Assessment & Plan Note (Signed)
New problem.  Suspect viral etiology given other symptoms. Treating with Pataday.

## 2016-01-20 NOTE — Patient Instructions (Signed)
Your exam was unremarkable.  Use the drop as directed.  If it worsens, you can use the drop that I gave your husband.  This is likely viral.  Follow up as needed  Take care  Dr. Lacinda Axon

## 2016-01-20 NOTE — Assessment & Plan Note (Addendum)
Patient with continued respiratory complaints. This is likely viral in origin. No indication for antibiotics. Supportive care.

## 2016-01-20 NOTE — Addendum Note (Signed)
Addended by: Coral Spikes on: 01/20/2016 10:28 AM   Modules accepted: Level of Service

## 2016-01-26 ENCOUNTER — Other Ambulatory Visit: Payer: Self-pay | Admitting: Cardiovascular Disease

## 2016-01-26 ENCOUNTER — Telehealth: Payer: Self-pay | Admitting: Cardiovascular Disease

## 2016-01-26 MED ORDER — AMLODIPINE BESYLATE 2.5 MG PO TABS: 5.0000 mg | ORAL_TABLET | Freq: Every day | ORAL | Status: DC

## 2016-01-26 NOTE — Telephone Encounter (Signed)
Refill sent for norvasc 2.5 mg two daily 180 with 3 refills.

## 2016-01-26 NOTE — Telephone Encounter (Signed)
Patient pharmacy [facility] needs corrected rx refill for norvasc 2.5 mg po twice daily.  Refill was sent in for 90 tablets which is 45 day supply .  Patient needs 90 day supply.    *STAT* If patient is at the pharmacy, call can be transferred to refill team.   1. Which medications need to be refilled? (please list name of each medication and dose if known) norvasc 2.5 mg po twice daily   2. Which pharmacy/location (including street and city if local pharmacy) is medication to be sent to?  [address] [place]    3. Do they need a 30 day or 90 day supply? [place]

## 2016-01-30 ENCOUNTER — Other Ambulatory Visit: Payer: Self-pay

## 2016-01-30 MED ORDER — APIXABAN 5 MG PO TABS: 5.0000 mg | ORAL_TABLET | Freq: Two times a day (BID) | ORAL | Status: DC

## 2016-02-10 ENCOUNTER — Telehealth: Payer: Self-pay | Admitting: Internal Medicine

## 2016-02-10 MED ORDER — OMEPRAZOLE 40 MG PO CPDR: 40.0000 mg | DELAYED_RELEASE_CAPSULE | Freq: Every day | ORAL | Status: DC

## 2016-02-10 NOTE — Telephone Encounter (Signed)
Patient stated she did not like protonix and wants to be re-prescribed her prilosec. Please advise?

## 2016-02-10 NOTE — Telephone Encounter (Signed)
Yes, sent

## 2016-02-16 ENCOUNTER — Ambulatory Visit: Payer: Medicare Other | Admitting: Cardiovascular Disease

## 2016-02-19 ENCOUNTER — Telehealth: Payer: Self-pay

## 2016-02-19 NOTE — Telephone Encounter (Signed)
Left a message for patient to call back and schedule annual wellness exam with [facility].

## 2016-02-24 ENCOUNTER — Encounter: Payer: Self-pay | Admitting: Obstetrics and Gynecology

## 2016-03-18 ENCOUNTER — Ambulatory Visit (INDEPENDENT_AMBULATORY_CARE_PROVIDER_SITE_OTHER): Payer: Medicare Other

## 2016-03-18 ENCOUNTER — Other Ambulatory Visit: Payer: Self-pay

## 2016-03-18 VITALS — BP 142/70 | HR 60 | Temp 97.1°F | Resp 14 | Ht 63.0 in | Wt 159.8 lb

## 2016-03-18 DIAGNOSIS — Z0000 Encounter for general adult medical examination without abnormal findings: Secondary | ICD-10-CM

## 2016-03-18 DIAGNOSIS — Z760 Encounter for issue of repeat prescription: Secondary | ICD-10-CM

## 2016-03-18 MED ORDER — HYDROCODONE-ACETAMINOPHEN 5-325 MG PO TABS: 1.0000 | ORAL_TABLET | Freq: Four times a day (QID) | ORAL | Status: DC | PRN

## 2016-03-18 NOTE — Progress Notes (Signed)
Subjective:   [name] [name] is a 80 y.o. female who presents for Medicare Annual (Subsequent) preventive examination.  Review of Systems:  No ROS.  Medicare Wellness Visit.  Cardiac Risk Factors include: advanced age (>65men, >77 women);hypertension     Objective:     Vitals: BP 142/70 mmHg  Pulse 60  Temp(Src) 97.1 F (36.2 C) (Oral)  Resp 14  Ht 5\' 3"  (1.6 m)  Wt 159 lb 12.8 oz (72.485 kg)  BMI 28.31 kg/m2  SpO2 97%  Body mass index is 28.31 kg/(m^2).   Tobacco History  Smoking status  . Never Smoker   Smokeless tobacco  . Never Used     Counseling given: Not Answered   Past Medical History  Diagnosis Date  . Atrial fibrillation (HCC)     Paroxysmal, hx of  . Cardiomyopathy   . Dyspnea   . Headache(784.0)     chronic  . Hypertension   . Pre-syncope   . Chronic kidney disease     acute renal failure secondary to dehydration which is now resolved  . Light headedness     due to dehydration  . Atrial flutter ([place]) 02/2011    s/p cardioversion   . Hyperlipidemia   . Knee fracture   . Cystocele    Past Surgical History  Procedure Laterality Date  . Cardioversion      x 3  . Cholecystectomy    . Appendectomy    . Multiple orthopedic procedures    . Mastectomy  1986    Bilateral with silicone  breast implants, s/p saline replacements  . Cardiac catheterization    . Combined augmentation mammaplasty and abdominoplasty    . Abdominal hysterectomy  1990  . Total knee arthroplasty Left   . Cardioversion    . Augmentation mammaplasty Bilateral 1986    implants  . Augmentation mammaplasty  1990  . Augmentation mammaplasty  2011   Family History  Problem Relation Age of Onset  . Heart disease Mother   . Cancer Father     stomach  . Cancer Sister     breast  . Breast cancer Sister   . Cancer Maternal Grandmother     breast  . Breast cancer Maternal Grandmother   . Cancer Sister     breast  . Breast cancer Sister   . Diabetes Neg Hx   .  Heart disease Son     found at autopsy  . Breast cancer Sister    History  Sexual Activity  . Sexual Activity: No    Outpatient Encounter Prescriptions as of 03/18/2016  Medication Sig  . amiodarone (PACERONE) 100 MG tablet Take 1 tablet (100 mg total) by mouth daily.  [name] amLODipine (NORVASC) 2.5 MG tablet Take 2 tablets (5 mg total) by mouth daily.  [name] apixaban (ELIQUIS) 5 MG TABS tablet Take 1 tablet (5 mg total) by mouth 2 (two) times daily.  . Cholecalciferol (VITAMIN D3) 1000 UNITS CAPS Take 1 capsule by mouth daily.  [name] conjugated estrogens (PREMARIN) vaginal cream 1/2-1 gram intravaginal BIW  . diazepam (VALIUM) 5 MG tablet Take 1 tablet (5 mg total) by mouth at bedtime as needed and may repeat dose one time if needed for anxiety.  . enalapril (VASOTEC) 2.5 MG tablet Take 2.5 mg by mouth daily.  . fluticasone (FLONASE) 50 MCG/ACT nasal spray Place 2 sprays into both nostrils daily.  [name] omeprazole (PRILOSEC) 40 MG capsule Take 1 capsule (40 mg total) by mouth daily.  . [  DISCONTINUED] HYDROcodone-acetaminophen (NORCO/VICODIN) 5-325 MG tablet Take 1 tablet by mouth every 6 (six) hours as needed for moderate pain.  . [DISCONTINUED] Olopatadine HCl 0.2 % SOLN 1 drop to affected eye daily.  . pravastatin (PRAVACHOL) 20 MG tablet TAKE ONE (1) TABLET BY MOUTH EVERY DAY  . [DISCONTINUED] amoxicillin (AMOXIL) 500 MG capsule Take 1 capsule (500 mg total) by mouth 3 (three) times daily. (Patient not taking: Reported on 01/20/2016)  . [DISCONTINUED] predniSONE (DELTASONE) 10 MG tablet 6 tablets daily for 1 day,  Then taper by 1 tablet daily until gone   No facility-administered encounter medications on file as of 03/18/2016.    Activities of Daily Living In your present state of health, do you have any difficulty performing the following activities: 03/18/2016  Hearing? N  Vision? N  Difficulty concentrating or making decisions? N  Walking or climbing stairs? Y  Dressing or bathing? N  Doing  errands, shopping? N  Preparing Food and eating ? N  Using the Toilet? N  In the past six months, have you accidently leaked urine? N  Do you have problems with loss of bowel control? N  Managing your Medications? N  Managing your Finances? N  Housekeeping or managing your Housekeeping? N    Patient Care Team: [name], MD as PCP - General (Internal Medicine) Wellington Hampshire, MD as Consulting Physician (Cardiology)    Assessment:   This is a routine wellness examination for [name] [name]. The goal of the wellness visit is to assist the patient how to close the gaps in care and create a preventative care plan for the patient.   Taking VIT D3 Calcium as appropriate/Osteoporosis risk reviewed.  Medications reviewed; taking without issues or barriers.  Safety issues reviewed; smoke detectors in the home. Firearms locked in a safe area in the home. Wears seatbelts when driving or riding with others. No violence in the home.  No identified risk were noted; The patient was oriented x 3; appropriate in dress and manner and no objective failures at ADL's or IADL's.   Cardiomyopathy in-stable and followed by Dr. Fletcher Anon Persistent atrial fibril-stable and followed by Dr. Fletcher Anon Primary pulmonary hypertension-stable and followed by PCP  Patient Concerns:  Reports some soreness and lower back pain from doing yard work two days ago; pain medication refilled, per PCP.  Encouraged to follow up with PCP as needed.  Exercise Activities and Dietary recommendations Current Exercise Habits: Home exercise routine (Does yard work with husband.  ), Frequency (Times/Week): 1, Intensity: Moderate  Goals    . Healthy Lifestyle     Stay hydrated and drink plenty of water. Low carb foods.  Lean meats, fruits and vegetables. Start walking 3 times a week around the track, as tolerated.      Fall Risk Fall Risk  03/18/2016 12/12/2015 06/12/2015 12/11/2014 11/07/2013  Falls in the past year? Yes No  Yes Yes No  Number falls in past yr: 1 - 1 1 -  Injury with Fall? No - Yes Yes -  Risk for fall due to : Impaired balance/gait - - Impaired balance/gait -  Follow up Falls prevention discussed;Education provided - Falls prevention discussed - -   Depression Screen PHQ 2/9 Scores 03/18/2016 12/12/2015 06/12/2015 12/11/2014  PHQ - 2 Score 0 0 0 0     Cognitive Testing MMSE - Mini Mental State Exam 03/18/2016  Orientation to time 5  Orientation to Place 5  Registration 3  Attention/ Calculation 5  Recall 3  Language- name 2 objects 2  Language- repeat 1  Language- follow 3 step command 3  Language- read & follow direction 1  Write a sentence 1  Copy design 1  Total score 30    Immunization History  Administered Date(s) Administered  . Influenza Split 10/03/2012  . Influenza-Unspecified 09/21/2013, 10/09/2014, 10/01/2015  . Pneumococcal Conjugate-13 12/11/2014  . Pneumococcal Polysaccharide-23 11/08/2003, 11/07/2013  . Td 06/22/2008, 11/30/2014  . Tdap 11/30/2014  . Zoster 04/24/2011   Screening Tests Health Maintenance  Topic Date Due  . INFLUENZA VACCINE  07/20/2016  . TETANUS/TDAP  11/30/2024  . DEXA SCAN  Completed  . ZOSTAVAX  Completed  . PNA vac Low Risk Adult  Completed      Plan:   End of life planning; Advance aging; Advanced directives discussed. Copy of current HCPOA/Living Will requested.   During the course of the visit the patient was educated and counseled about the following appropriate screening and preventive services:   Vaccines to include Pneumoccal, Influenza, Hepatitis B, Td, Zostavax, HCV  Electrocardiogram  Cardiovascular Disease  Colorectal cancer screening  Bone density screening  Diabetes screening  Glaucoma screening  Mammography/PAP  Nutrition counseling   Patient Instructions (the written plan) was given to the patient.   Varney Biles, LPN  [id number]

## 2016-03-18 NOTE — Patient Instructions (Addendum)
[name] [name] , Thank you for taking time to come for your Medicare Wellness Visit. I appreciate your ongoing commitment to your health goals. Please review the following plan we discussed and let me know if I can assist you in the future.   Follow up with Dr. Derrel Nip as needed.    This is a list of the screening recommended for you and due dates:  Health Maintenance  Topic Date Due  . Flu Shot  07/20/2016  . Tetanus Vaccine  11/30/2024  . DEXA scan (bone density measurement)  Completed  . Shingles Vaccine  Completed  . Pneumonia vaccines  Completed    Fall Prevention in the Home  Falls can cause injuries. They can happen to people of all ages. There are many things you can do to make your home safe and to help prevent falls.  WHAT CAN I DO ON THE OUTSIDE OF MY HOME?  Regularly fix the edges of walkways and driveways and fix any cracks.  Remove anything that might make you trip as you walk through a door, such as a raised step or threshold.  Trim any bushes or trees on the path to your home.  Use bright outdoor lighting.  Clear any walking paths of anything that might make someone trip, such as rocks or tools.  Regularly check to see if handrails are loose or broken. Make sure that both sides of any steps have handrails.  Any raised decks and porches should have guardrails on the edges.  Have any leaves, snow, or ice cleared regularly.  Use sand or salt on walking paths during winter.  Clean up any spills in your garage right away. This includes oil or grease spills. WHAT CAN I DO IN THE BATHROOM?   Use night lights.  Install grab bars by the toilet and in the tub and shower. Do not use towel bars as grab bars.  Use non-skid mats or decals in the tub or shower.  If you need to sit down in the shower, use a plastic, non-slip stool.  Keep the floor dry. Clean up any water that spills on the floor as soon as it happens.  Remove soap buildup in the tub or shower  regularly.  Attach bath mats securely with double-sided non-slip rug tape.  Do not have throw rugs and other things on the floor that can make you trip. WHAT CAN I DO IN THE BEDROOM?  Use night lights.  Make sure that you have a light by your bed that is easy to reach.  Do not use any sheets or blankets that are too big for your bed. They should not hang down onto the floor.  Have a firm chair that has side arms. You can use this for support while you get dressed.  Do not have throw rugs and other things on the floor that can make you trip. WHAT CAN I DO IN THE KITCHEN?  Clean up any spills right away.  Avoid walking on wet floors.  Keep items that you use a lot in easy-to-reach places.  If you need to reach something above you, use a strong step stool that has a grab bar.  Keep electrical cords out of the way.  Do not use floor polish or wax that makes floors slippery. If you must use wax, use non-skid floor wax.  Do not have throw rugs and other things on the floor that can make you trip. WHAT CAN I DO WITH MY STAIRS?  Do  not leave any items on the stairs.  Make sure that there are handrails on both sides of the stairs and use them. Fix handrails that are broken or loose. Make sure that handrails are as long as the stairways.  Check any carpeting to make sure that it is firmly attached to the stairs. Fix any carpet that is loose or worn.  Avoid having throw rugs at the top or bottom of the stairs. If you do have throw rugs, attach them to the floor with carpet tape.  Make sure that you have a light switch at the top of the stairs and the bottom of the stairs. If you do not have them, ask someone to add them for you. WHAT ELSE CAN I DO TO HELP PREVENT FALLS?  Wear shoes that:  Do not have high heels.  Have rubber bottoms.  Are comfortable and fit you well.  Are closed at the toe. Do not wear sandals.  If you use a stepladder:  Make sure that it is fully  opened. Do not climb a closed stepladder.  Make sure that both sides of the stepladder are locked into place.  Ask someone to hold it for you, if possible.  Clearly mark and make sure that you can see:  Any grab bars or handrails.  First and last steps.  Where the edge of each step is.  Use tools that help you move around (mobility aids) if they are needed. These include:  Canes.  Walkers.  Scooters.  Crutches.  Turn on the lights when you go into a dark area. Replace any light bulbs as soon as they burn out.  Set up your furniture so you have a clear path. Avoid moving your furniture around.  If any of your floors are uneven, fix them.  If there are any pets around you, be aware of where they are.  Review your medicines with your doctor. Some medicines can make you feel dizzy. This can increase your chance of falling. Ask your doctor what other things that you can do to help prevent falls.   This information is not intended to replace advice given to you by your health care provider. Make sure you discuss any questions you have with your health care provider.   Document Released: 10/02/2009 Document Revised: 04/22/2015 Document Reviewed: 01/10/2015 Elsevier Interactive Patient Education [employer].

## 2016-03-22 NOTE — Progress Notes (Signed)
  I have reviewed the above information and agree with above.   Janice Seales, MD 

## 2016-03-24 ENCOUNTER — Ambulatory Visit (INDEPENDENT_AMBULATORY_CARE_PROVIDER_SITE_OTHER): Payer: Medicare Other | Admitting: Obstetrics and Gynecology

## 2016-03-24 ENCOUNTER — Encounter: Payer: Self-pay | Admitting: Obstetrics and Gynecology

## 2016-03-24 VITALS — BP 134/65 | HR 61 | Ht 63.0 in | Wt 161.2 lb

## 2016-03-24 DIAGNOSIS — IMO0002 Reserved for concepts with insufficient information to code with codable children: Secondary | ICD-10-CM

## 2016-03-24 DIAGNOSIS — N8110 Cystocele, unspecified: Secondary | ICD-10-CM | POA: Diagnosis not present

## 2016-03-24 DIAGNOSIS — N952 Postmenopausal atrophic vaginitis: Secondary | ICD-10-CM | POA: Diagnosis not present

## 2016-03-24 DIAGNOSIS — Z780 Asymptomatic menopausal state: Secondary | ICD-10-CM | POA: Diagnosis not present

## 2016-03-24 DIAGNOSIS — Z1211 Encounter for screening for malignant neoplasm of colon: Secondary | ICD-10-CM

## 2016-03-24 DIAGNOSIS — Z803 Family history of malignant neoplasm of breast: Secondary | ICD-10-CM

## 2016-03-24 NOTE — Patient Instructions (Signed)
1. No Pap smear needed. 2. Mammogram already obtained this year 3. Stool guaiac cards are given for colon cancer screening 4. Continue calcium with vitamin D supplementation along with weightbearing exercise and healthy diet 5. Continue Premarin cream intravaginally once or twice a week 6. Return in 1 year for follow-up

## 2016-03-24 NOTE — Progress Notes (Signed)
Patient ID: [name] [name], female   DOB: 1936-02-14, 80 y.o.   MRN: [id number] ANNUAL PREVENTATIVE CARE GYN  ENCOUNTER NOTE  Subjective:       [name] KUCHTA is a 80 y.o. G48P2002 female here for a routine annual gynecologic exam.  Current complaints: 1.  none  Bowel and bladder function are normal. Patient is using Premarin cream intravaginally once or twice a week Mammogram is already been obtained this year.  Gynecologic History No LMP recorded. Patient has had a hysterectomy. Status post TAH/BSO  Contraception: status post hysterectomy Last Pap: unsure. Results were: normal Last mammogram: 2016. Results were: normal  Obstetric History OB History  Gravida Para Term Preterm AB SAB TAB Ectopic Multiple Living  2 2 2       2     # Outcome Date GA Lbr Len/2nd Weight Sex Delivery Anes PTL Lv  2 Term 1964    M VBAC   Y  1 Term 1962    M Vag-Spont   Y      Past Medical History  Diagnosis Date  . Atrial fibrillation (HCC)     Paroxysmal, hx of  . Cardiomyopathy   . Dyspnea   . Headache(784.0)     chronic  . Hypertension   . Pre-syncope   . Chronic kidney disease     acute renal failure secondary to dehydration which is now resolved  . Light headedness     due to dehydration  . Atrial flutter ([place]) 02/2011    s/p cardioversion   . Hyperlipidemia   . Knee fracture   . Cystocele     Past Surgical History  Procedure Laterality Date  . Cardioversion      x 3  . Cholecystectomy    . Appendectomy    . Multiple orthopedic procedures    . Mastectomy  1986    Bilateral with silicone  breast implants, s/p saline replacements  . Cardiac catheterization    . Combined augmentation mammaplasty and abdominoplasty    . Abdominal hysterectomy  1990  . Total knee arthroplasty Left   . Cardioversion    . Augmentation mammaplasty Bilateral 1986    implants  . Augmentation mammaplasty  1990  . Augmentation mammaplasty  2011    Current Outpatient Prescriptions on File Prior to  Visit  Medication Sig Dispense Refill  . amiodarone (PACERONE) 100 MG tablet Take 1 tablet (100 mg total) by mouth daily. 30 tablet 3  . amLODipine (NORVASC) 2.5 MG tablet Take 2 tablets (5 mg total) by mouth daily. 180 tablet 3  . apixaban (ELIQUIS) 5 MG TABS tablet Take 1 tablet (5 mg total) by mouth 2 (two) times daily. 60 tablet 6  . Cholecalciferol (VITAMIN D3) 1000 UNITS CAPS Take 1 capsule by mouth daily.    [name] conjugated estrogens (PREMARIN) vaginal cream 1/2-1 gram intravaginal BIW 60 g 1  . fluticasone (FLONASE) 50 MCG/ACT nasal spray Place 2 sprays into both nostrils daily. 16 g 0  . HYDROcodone-acetaminophen (NORCO/VICODIN) 5-325 MG tablet Take 1 tablet by mouth every 6 (six) hours as needed for moderate pain. 30 tablet 0  . pravastatin (PRAVACHOL) 20 MG tablet TAKE ONE (1) TABLET BY MOUTH EVERY DAY 90 tablet 2   No current facility-administered medications on file prior to visit.    Allergies  Allergen Reactions  . Biaxin [Clarithromycin]   . Codeine   . Fluocinonide Other (See Comments)    Tingling sensation in head and redness to scalp.  [name]  Iodine   . Promethazine Other (See Comments)  . Warfarin Sodium     Social History   Social History  . Marital Status: Married    Spouse Name: N/A  . Number of Children: 1  . Years of Education: N/A   Occupational History  .     Social History Main Topics  . Smoking status: Never Smoker   . Smokeless tobacco: Never Used  . Alcohol Use: No  . Drug Use: No  . Sexual Activity: No   Other Topics Concern  . Not on file   Social History Narrative    Family History  Problem Relation Age of Onset  . Heart disease Mother   . Cancer Father     stomach  . Cancer Sister     breast  . Breast cancer Sister   . Cancer Maternal Grandmother     breast  . Breast cancer Maternal Grandmother   . Cancer Sister     breast  . Breast cancer Sister   . Diabetes Neg Hx   . Heart disease Son     found at autopsy  . Breast cancer  Sister     The following portions of the patient's history were reviewed and updated as appropriate: allergies, current medications, past family history, past medical history, past social history, past surgical history and problem list.  Review of Systems ROS Review of Systems - General ROS: negative for - chills, fatigue, fever, hot flashes, night sweats, weight gain or weight loss Psychological ROS: negative for - anxiety, decreased libido, depression, mood swings, physical abuse or sexual abuse Ophthalmic ROS: negative for - blurry vision, eye pain or loss of vision ENT ROS: negative for - headaches, hearing change, visual changes or vocal changes Allergy and Immunology ROS: negative for - hives, itchy/watery eyes or seasonal allergies Hematological and Lymphatic ROS: negative for - bleeding problems, bruising, swollen lymph nodes or weight loss Endocrine ROS: negative for - galactorrhea, hair pattern changes, hot flashes, malaise/lethargy, mood swings, palpitations, polydipsia/polyuria, skin changes, temperature intolerance or unexpected weight changes Breast ROS: negative for - new or changing breast lumps or nipple discharge Respiratory ROS: negative for - cough or shortness of breath Cardiovascular ROS: negative for - chest pain, irregular heartbeat, palpitations or shortness of breath Gastrointestinal ROS: no abdominal pain, change in bowel habits, or black or bloody stools Genito-Urinary ROS: no dysuria, trouble voiding, or hematuria Musculoskeletal ROS: negative for - joint pain or joint stiffness Neurological ROS: negative for - bowel and bladder control changes Dermatological ROS: negative for rash and skin lesion changes   Objective:   BP 134/65 mmHg  Pulse 61  Ht 5\' 3"  (1.6 m)  Wt 161 lb 3.2 oz (73.12 kg)  BMI 28.56 kg/m2 CONSTITUTIONAL: Well-developed, well-nourished female in no acute distress.  PSYCHIATRIC: Normal mood and affect. Normal behavior. Normal judgment and  thought content. [place]: Alert and oriented to person, place, and time. Normal muscle tone coordination. No cranial nerve deficit noted. HENT:  Normocephalic, atraumatic, External right and left ear normal. Oropharynx is clear and moist EYES: Conjunctivae and EOM are normal. No scleral icterus.  NECK: Normal range of motion, supple, no masses.  Normal thyroid.  SKIN: Skin is warm and dry. No rash noted. Not diaphoretic. No erythema. No pallor. CARDIOVASCULAR: Normal heart rate noted, regular rhythm, no murmur. RESPIRATORY: Clear to auscultation bilaterally. Effort and breath sounds normal, no problems with respiration noted. BREASTS: Symmetric in size. No masses, skin changes, nipple drainage, or lymphadenopathy.  ABDOMEN: Soft, normal bowel sounds, no distention noted.  No tenderness, rebound or guarding.  BLADDER: Normal PELVIC:  External Genitalia: Normal; A few Cherry hemangiomas on right labia majora  BUS: Normal  Vagina: Normal; Mild atrophy; vaginal cuff intact and nontender  Cervix:Surgically absent  Uterus:Surgically absent  Adnexa: Normal  RV: External Exam NormaI, No Rectal Masses and Normal Sphincter tone  MUSCULOSKELETAL: Normal range of motion. No tenderness.  No cyanosis, clubbing, or edema.  2+ distal pulses. LYMPHATIC: No Axillary, Supraclavicular, or Inguinal Adenopathy.    Assessment:   Annual gynecologic examination 80 y.o. Contraception: status post hysterectomy [id number] Menopause, asymptomatic Vaginal atrophy, mild, managed with estrogen cream  Plan:  Pap: Not needed Mammogram: thru pcp Stool Guaiac Testing:  ordered Labs: thru pcp Routine preventative health maintenance measures emphasized: Exercise/Diet/Weight control, Tobacco Warnings and Alcohol/Substance use risks  Continue estrogen cream once or twice a week Return to [place], CMA  Brayton Mars, MD  Note: This dictation was prepared with Dragon dictation along  with smaller phrase technology. Any transcriptional errors that result from this process are unintentional.

## 2016-03-29 DIAGNOSIS — N2581 Secondary hyperparathyroidism of renal origin: Secondary | ICD-10-CM | POA: Diagnosis not present

## 2016-03-29 DIAGNOSIS — E785 Hyperlipidemia, unspecified: Secondary | ICD-10-CM | POA: Diagnosis not present

## 2016-03-29 DIAGNOSIS — I10 Essential (primary) hypertension: Secondary | ICD-10-CM | POA: Diagnosis not present

## 2016-03-29 DIAGNOSIS — N183 Chronic kidney disease, stage 3 (moderate): Secondary | ICD-10-CM | POA: Diagnosis not present

## 2016-03-29 LAB — BASIC METABOLIC PANEL
BUN: 20 mg/dL (ref 4–21)
Creatinine: 0.9 mg/dL (ref 0.5–1.1)
Glucose: 98 mg/dL
Potassium: 4.5 mmol/L (ref 3.4–5.3)
Sodium: 141 mmol/L (ref 137–147)

## 2016-03-29 LAB — CBC AND DIFFERENTIAL
HCT: 41 % (ref 36–46)
Hemoglobin: 13.2 g/dL (ref 12.0–16.0)
Neutrophils Absolute: 5 /uL
Platelets: 271 10*3/uL (ref 150–399)
WBC: 6.4 10^3/mL

## 2016-04-09 DIAGNOSIS — Z1211 Encounter for screening for malignant neoplasm of colon: Secondary | ICD-10-CM | POA: Diagnosis not present

## 2016-04-14 LAB — FECAL OCCULT BLOOD, IMMUNOCHEMICAL: Fecal Occult Bld: POSITIVE — AB

## 2016-04-15 ENCOUNTER — Telehealth: Payer: Self-pay

## 2016-04-15 DIAGNOSIS — R195 Other fecal abnormalities: Secondary | ICD-10-CM

## 2016-04-15 NOTE — Telephone Encounter (Signed)
Pt aware. Referral in for dr Allen Norris.

## 2016-04-15 NOTE — Telephone Encounter (Signed)
-----   Message from Brayton Mars, MD sent at 04/14/2016  9:42 PM EDT ----- Please notify - Abnormal Labs\ Refer to GI

## 2016-04-20 ENCOUNTER — Telehealth: Payer: Self-pay | Admitting: Cardiovascular Disease

## 2016-04-20 ENCOUNTER — Ambulatory Visit: Payer: Medicare Other | Admitting: Cardiovascular Disease

## 2016-04-20 NOTE — Telephone Encounter (Signed)
Samples were picked up today per pt signature.

## 2016-04-20 NOTE — Telephone Encounter (Signed)
Patient calling the office for samples of medication:   1.  What medication and dosage are you requesting samples for?  ELIQUIS 5 mg po x 2 daily   2.  Are you currently out of this medication?  Will be out before Thursday appt.

## 2016-04-22 ENCOUNTER — Ambulatory Visit (INDEPENDENT_AMBULATORY_CARE_PROVIDER_SITE_OTHER): Payer: Medicare Other | Admitting: Cardiovascular Disease

## 2016-04-22 ENCOUNTER — Encounter: Payer: Self-pay | Admitting: Cardiovascular Disease

## 2016-04-22 VITALS — BP 120/56 | HR 58 | Ht 64.0 in | Wt 159.8 lb

## 2016-04-22 DIAGNOSIS — I481 Persistent atrial fibrillation: Secondary | ICD-10-CM | POA: Diagnosis not present

## 2016-04-22 DIAGNOSIS — R000 Tachycardia, unspecified: Secondary | ICD-10-CM

## 2016-04-22 DIAGNOSIS — I43 Cardiomyopathy in diseases classified elsewhere: Secondary | ICD-10-CM

## 2016-04-22 DIAGNOSIS — I10 Essential (primary) hypertension: Secondary | ICD-10-CM | POA: Diagnosis not present

## 2016-04-22 DIAGNOSIS — I4819 Other persistent atrial fibrillation: Secondary | ICD-10-CM

## 2016-04-22 NOTE — Patient Instructions (Signed)
Medication Instructions:  Your physician recommends that you continue on your current medications as directed. Please refer to the Current Medication list given to you today.   Labwork: BMET, CBC  Testing/Procedures: none  Follow-Up: Your physician wants you to follow-up in: six months with Dr. Arida.  You will receive a reminder letter in the mail two months in advance. If you don't receive a letter, please call our office to schedule the follow-up appointment.   Any Other Special Instructions Will Be Listed Below (If Applicable).     If you need a refill on your cardiac medications before your next appointment, please call your pharmacy.   

## 2016-04-22 NOTE — Progress Notes (Signed)
Cardiology Office Note   Date:  04/22/2016   ID:  ONDRA KALE, DOB 1936-07-27, MRN [name]  PCP:  [name], MD  Cardiologist:   [name], MD   Chief Complaint  Patient presents with  . other    6 month follow up. Meds reviewed by the patient verbally. Pt. c/o BP elevated at times and was told had some blood in stool from the stool cards.       History of Present Illness: [name] [name] is a 80 y.o. female who presents for a followup visit regarding persistent atrial fibrillation.   She has known history of persistent atrial fibrillation/flutter status post multiple cardioversions in the past. 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 She had cardiac catheterization twice in the past without significant coronary artery disease.  Most recent cardioversion was in August of 2014. She did have bradycardia post cardioversion. She has known history of labile hypertension. Most recent Echocardiogram in November 2016 showed normal LV systolic function with mildly dilated left atrium, mild mitral regurgitation, and mild to moderate pulmonary hypertension.  She had intermittent Mobitz 2 AV block which resolved after decreasing the dose of amiodarone to 100 mg once daily.  She has been doing well overall with no chest pain or significant dyspnea. No palpitations or dizziness. She was found to have positive fecal occult blood. She did not notice any bleeding herself. She is awaiting GI evaluation.  Past Medical History  Diagnosis Date  . Atrial fibrillation (HCC)     Paroxysmal, hx of  . Cardiomyopathy   . Dyspnea   . Headache(784.0)     chronic  . Hypertension   . Pre-syncope   . Chronic kidney disease     acute renal failure secondary to dehydration which is now resolved  . Light headedness     due to dehydration  . Atrial flutter ([place]) 02/2011    s/p cardioversion   . Hyperlipidemia   . Knee fracture   . Cystocele      Past Surgical History  Procedure Laterality Date  . Cardioversion      x 3  . Cholecystectomy    . Appendectomy    . Multiple orthopedic procedures    . Mastectomy  1986    Bilateral with silicone  breast implants, s/p saline replacements  . Cardiac catheterization    . Combined augmentation mammaplasty and abdominoplasty    . Abdominal hysterectomy  1990  . Total knee arthroplasty Left   . Cardioversion    . Augmentation mammaplasty Bilateral 1986    implants  . Augmentation mammaplasty  1990  . Augmentation mammaplasty  2011     Current Outpatient Prescriptions  Medication Sig Dispense Refill  . amiodarone (PACERONE) 100 MG tablet Take 1 tablet (100 mg total) by mouth daily. 30 tablet 3  . amLODipine (NORVASC) 2.5 MG tablet Take 2 tablets (5 mg total) by mouth daily. 180 tablet 3  . apixaban (ELIQUIS) 5 MG TABS tablet Take 1 tablet (5 mg total) by mouth 2 (two) times daily. 60 tablet 6  . Cholecalciferol (VITAMIN D3) 1000 UNITS CAPS Take 1 capsule by mouth daily.    [name] conjugated estrogens (PREMARIN) vaginal cream 1/2-1 gram intravaginal BIW 60 g 1  . fluticasone (FLONASE) 50 MCG/ACT nasal spray Place 2 sprays into both nostrils daily. 16 g 0  . HYDROcodone-acetaminophen (NORCO/VICODIN) 5-325 MG tablet Take 1 tablet by mouth every 6 (six) hours as needed for  moderate pain. 30 tablet 0  . pantoprazole (PROTONIX) 20 MG tablet Take 20 mg by mouth daily.    . pravastatin (PRAVACHOL) 20 MG tablet TAKE ONE (1) TABLET BY MOUTH EVERY DAY 90 tablet 2   No current facility-administered medications for this visit.    Allergies:   Biaxin; Codeine; Fluocinonide; Iodine; Promethazine; and Warfarin sodium    Social History:  The patient  reports that she has never smoked. She has never used smokeless tobacco. She reports that she does not drink alcohol or use illicit drugs.   Family History:  The patient's family history includes Breast cancer in her maternal grandmother, sister,  sister, and sister; Cancer in her father, maternal grandmother, sister, and sister; Heart disease in her mother and son. There is no history of Diabetes.    ROS:  Please see the history of present illness.   Otherwise, review of systems are positive for none.   All other systems are reviewed and negative.    PHYSICAL EXAM: VS:  BP 120/56 mmHg  Pulse 58  Ht 5\' 4"  (1.626 m)  Wt 159 lb 12 oz (72.462 kg)  BMI 27.41 kg/m2 , BMI Body mass index is 27.41 kg/(m^2). GEN: Well nourished, well developed, in no acute distress HEENT: normal Neck: no JVD, carotid bruits, or masses Cardiac: RRR; no murmurs, rubs, or gallops,no edema  Respiratory:  clear to auscultation bilaterally, normal work of breathing GI: soft, nontender, nondistended, + BS MS: no deformity or atrophy Skin: warm and dry, no rash Neuro:  Strength and sensation are intact Psych: euthymic mood, full affect   EKG:  EKG is ordered today. The ekg ordered today demonstrates normal sinus rhythm with right bundle branch block and nonspecific T wave changes.   Recent Labs: 10/10/2015: ALT 17 11/22/2015: BUN 17; Creatinine, Ser 1.13*; Hemoglobin 13.2; Platelets 217; Potassium 4.2; Sodium 139    Lipid Panel    Component Value Date/Time   CHOL 174 04/22/2015 1044   TRIG 82.0 04/22/2015 1044   HDL 61.00 04/22/2015 1044   CHOLHDL 3 04/22/2015 1044   VLDL 16.4 04/22/2015 1044   LDLCALC 97 04/22/2015 1044      Wt Readings from Last 3 Encounters:  04/22/16 159 lb 12 oz (72.462 kg)  03/24/16 161 lb 3.2 oz (73.12 kg)  03/18/16 159 lb 12.8 oz (72.485 kg)       ASSESSMENT AND PLAN:  1.  Persistent atrial fibrillation: Currently maintaining in sinus rhythm with small dose amiodarone. She is on anticoagulation with Eliquis. She was found to have positive fecal occult blood recently and thus I requested CBC and basic metabolic profile. If she is significantly anemic which I doubt, we might have to hold her anticoagulation.  2.  Essential hypertension: Blood pressure is well controlled on current medications.  3. History of tachycardia-induced cardiomyopathy. Most recent ejection fraction was normal and the pulmonary pressure has improved.    Disposition:   FU with me in 6 months  Signed,  [name], MD  04/22/2016 2:21 PM    [place]

## 2016-04-23 LAB — CBC
Hematocrit: 39.2 % (ref 34.0–46.6)
Hemoglobin: 12.7 g/dL (ref 11.1–15.9)
MCH: 30.5 pg (ref 26.6–33.0)
MCHC: 32.4 g/dL (ref 31.5–35.7)
MCV: 94 fL (ref 79–97)
Platelets: 241 10*3/uL (ref 150–379)
RBC: 4.17 x10E6/uL (ref 3.77–5.28)
RDW: 13.8 % (ref 12.3–15.4)
WBC: 6 10*3/uL (ref 3.4–10.8)

## 2016-04-23 LAB — BASIC METABOLIC PANEL
BUN/Creatinine Ratio: 19 (ref 12–28)
BUN: 20 mg/dL (ref 8–27)
CO2: 20 mmol/L (ref 18–29)
Calcium: 9.1 mg/dL (ref 8.7–10.3)
Chloride: 103 mmol/L (ref 96–106)
Creatinine, Ser: 1.08 mg/dL — ABNORMAL HIGH (ref 0.57–1.00)
GFR calc Af Amer: 57 mL/min/{1.73_m2} — ABNORMAL LOW (ref >59)
GFR calc non Af Amer: 49 mL/min/{1.73_m2} — ABNORMAL LOW (ref >59)
Glucose: 123 mg/dL — ABNORMAL HIGH (ref 65–99)
Potassium: 4.3 mmol/L (ref 3.5–5.2)
Sodium: 140 mmol/L (ref 134–144)

## 2016-04-26 ENCOUNTER — Telehealth: Payer: Self-pay | Admitting: Cardiovascular Disease

## 2016-04-26 NOTE — Telephone Encounter (Signed)
Pt calling back to let us know she is taking Enalapril Maleate 2.5 mg. States Dr. Holley Raring gave it to her for her kidneys. She wanted to let Dr. Fletcher Anon know

## 2016-04-27 ENCOUNTER — Other Ambulatory Visit: Payer: Self-pay

## 2016-04-27 NOTE — Telephone Encounter (Signed)
S/w pt who reports she is not taking enalapril 2.5mg  as prescribed by Dr. Holley Raring.  Reports when she took it, BP dropped. At 5/4 OV w/Dr. Fletcher Anon, she could not remember the name of this medication and called back to let us know. Confirmed that medication is not on pt med list.

## 2016-05-03 DIAGNOSIS — R195 Other fecal abnormalities: Secondary | ICD-10-CM | POA: Diagnosis not present

## 2016-05-07 ENCOUNTER — Other Ambulatory Visit: Payer: Self-pay | Admitting: Cardiovascular Disease

## 2016-05-07 ENCOUNTER — Encounter: Payer: Self-pay | Admitting: *Deleted

## 2016-05-14 DIAGNOSIS — M25561 Pain in right knee: Secondary | ICD-10-CM | POA: Diagnosis not present

## 2016-05-14 DIAGNOSIS — M2391 Unspecified internal derangement of right knee: Secondary | ICD-10-CM | POA: Diagnosis not present

## 2016-05-19 DIAGNOSIS — H2513 Age-related nuclear cataract, bilateral: Secondary | ICD-10-CM | POA: Diagnosis not present

## 2016-05-24 ENCOUNTER — Other Ambulatory Visit: Payer: Self-pay | Admitting: Physician Assistant

## 2016-05-24 DIAGNOSIS — M2391 Unspecified internal derangement of right knee: Secondary | ICD-10-CM

## 2016-06-07 ENCOUNTER — Other Ambulatory Visit: Payer: Self-pay | Admitting: Internal Medicine

## 2016-06-07 DIAGNOSIS — Z1231 Encounter for screening mammogram for malignant neoplasm of breast: Secondary | ICD-10-CM

## 2016-06-11 ENCOUNTER — Telehealth: Payer: Self-pay | Admitting: Cardiovascular Disease

## 2016-06-11 ENCOUNTER — Ambulatory Visit
Admission: RE | Admit: 2016-06-11 | Discharge: 2016-06-11 | Disposition: A | Discharge status: Home / Self Care | Payer: Medicare Other | Source: Ambulatory Visit | Attending: Physician Assistant | Admitting: Physician Assistant

## 2016-06-11 DIAGNOSIS — S83221A Peripheral tear of medial meniscus, current injury, right knee, initial encounter: Secondary | ICD-10-CM | POA: Diagnosis not present

## 2016-06-11 DIAGNOSIS — M23341 Other meniscus derangements, anterior horn of lateral meniscus, right knee: Secondary | ICD-10-CM | POA: Diagnosis not present

## 2016-06-11 DIAGNOSIS — M2391 Unspecified internal derangement of right knee: Secondary | ICD-10-CM | POA: Diagnosis present

## 2016-06-11 DIAGNOSIS — M23303 Other meniscus derangements, unspecified medial meniscus, right knee: Secondary | ICD-10-CM | POA: Diagnosis not present

## 2016-06-11 DIAGNOSIS — S83241A Other tear of medial meniscus, current injury, right knee, initial encounter: Secondary | ICD-10-CM | POA: Diagnosis not present

## 2016-06-11 NOTE — Telephone Encounter (Signed)
LMOM samples Eliquis 5 mg available to pick up.

## 2016-06-11 NOTE — Telephone Encounter (Signed)
Pt would like Eliquis 5 mg samples.  

## 2016-06-18 DIAGNOSIS — M2391 Unspecified internal derangement of right knee: Secondary | ICD-10-CM | POA: Diagnosis not present

## 2016-06-24 ENCOUNTER — Ambulatory Visit: Payer: Medicare Other

## 2016-06-28 ENCOUNTER — Ambulatory Visit (INDEPENDENT_AMBULATORY_CARE_PROVIDER_SITE_OTHER): Payer: Medicare Other | Admitting: Internal Medicine

## 2016-06-28 ENCOUNTER — Telehealth: Payer: Self-pay | Admitting: Internal Medicine

## 2016-06-28 VITALS — BP 124/62 | HR 57 | Temp 97.7°F | Resp 15 | Ht 64.0 in | Wt 160.8 lb

## 2016-06-28 DIAGNOSIS — E538 Deficiency of other specified B group vitamins: Secondary | ICD-10-CM

## 2016-06-28 DIAGNOSIS — R7303 Prediabetes: Secondary | ICD-10-CM

## 2016-06-28 DIAGNOSIS — Z01818 Encounter for other preprocedural examination: Secondary | ICD-10-CM

## 2016-06-28 DIAGNOSIS — F419 Anxiety disorder, unspecified: Secondary | ICD-10-CM

## 2016-06-28 DIAGNOSIS — M1711 Unilateral primary osteoarthritis, right knee: Secondary | ICD-10-CM

## 2016-06-28 DIAGNOSIS — F418 Other specified anxiety disorders: Secondary | ICD-10-CM | POA: Diagnosis not present

## 2016-06-28 DIAGNOSIS — F32A Depression, unspecified: Secondary | ICD-10-CM

## 2016-06-28 DIAGNOSIS — R5383 Other fatigue: Secondary | ICD-10-CM

## 2016-06-28 DIAGNOSIS — M179 Osteoarthritis of knee, unspecified: Secondary | ICD-10-CM

## 2016-06-28 DIAGNOSIS — F329 Major depressive disorder, single episode, unspecified: Secondary | ICD-10-CM

## 2016-06-28 NOTE — Progress Notes (Signed)
Pre visit review using our clinic review tool, if applicable. No additional management support is needed unless otherwise documented below in the visit note. 
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
## 2016-06-28 NOTE — Patient Instructions (Addendum)
Try taking "Tylenol PM" at night:  1 tablet  About 1 hour before you want to be asleep.    If it does not help, or if you do not tolerate it,  I recommend a trial of lexapro   We are checking you for  sodium,  B12,  CBC  And diabetes today

## 2016-06-29 ENCOUNTER — Encounter: Payer: Self-pay | Admitting: Internal Medicine

## 2016-06-29 DIAGNOSIS — R5383 Other fatigue: Secondary | ICD-10-CM | POA: Insufficient documentation

## 2016-06-29 DIAGNOSIS — Z01818 Encounter for other preprocedural examination: Secondary | ICD-10-CM | POA: Insufficient documentation

## 2016-06-29 LAB — COMPREHENSIVE METABOLIC PANEL
ALT: 14 U/L (ref 0–35)
AST: 14 U/L (ref 0–37)
Albumin: 4.1 g/dL (ref 3.5–5.2)
Alkaline Phosphatase: 50 U/L (ref 39–117)
BUN: 25 mg/dL — ABNORMAL HIGH (ref 6–23)
CO2: 28 mEq/L (ref 19–32)
Calcium: 9.2 mg/dL (ref 8.4–10.5)
Chloride: 106 mEq/L (ref 96–112)
Creatinine, Ser: 1.29 mg/dL — ABNORMAL HIGH (ref 0.40–1.20)
GFR: 42.43 mL/min — ABNORMAL LOW (ref >60.00)
Glucose, Bld: 106 mg/dL — ABNORMAL HIGH (ref 70–99)
Potassium: 4.4 mEq/L (ref 3.5–5.1)
Sodium: 140 mEq/L (ref 135–145)
Total Bilirubin: 0.6 mg/dL (ref 0.2–1.2)
Total Protein: 6.7 g/dL (ref 6.0–8.3)

## 2016-06-29 LAB — MICROALBUMIN / CREATININE URINE RATIO
Creatinine,U: 215.5 mg/dL
Microalb Creat Ratio: 0.5 mg/g (ref 0.0–30.0)
Microalb, Ur: 1 mg/dL (ref 0.0–1.9)

## 2016-06-29 LAB — VITAMIN B12: Vitamin B-12: 371 pg/mL (ref 211–911)

## 2016-06-29 LAB — HEMOGLOBIN A1C: Hgb A1c MFr Bld: 5.8 % (ref 4.6–6.5)
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
## 2016-06-29 NOTE — Assessment & Plan Note (Addendum)
Preoperative medical clearance, requested by her orthopedist, for future right knee arthroscopy.  She has  has had no recent episodes of chest pain, does not have  DM Type 2 or CKD.  She does have pulmonary hypertension, atrial fibrillation , on anticoagulation and normal EF with no WMA by most recent ECHO that was done Nov 2016.  Her Eliquis will require suspension 3 days prior to procedure and resuming 2 days post procedure.

## 2016-06-29 NOTE — Assessment & Plan Note (Addendum)
Etiology likely inadequate restorative sleep due to insomnia and nocturia,  Checking B12 , A1c , lytes, an d Thyroid etc today .  All are normal  Lab Results  Component Value Date   VITAMINB12 371 06/28/2016   Lab Results  Component Value Date   WBC 6.0 04/22/2016   HGB 13.2 03/29/2016   HCT 39.2 04/22/2016   MCV 94 04/22/2016   PLT 241 04/22/2016   Lab Results  Component Value Date   TSH 2.03 04/22/2015   Lab Results  Component Value Date   HGBA1C 5.8 06/28/2016

## 2016-06-29 NOTE — Assessment & Plan Note (Signed)
She is scheduled for arthroscopic surgery by Dr. Marry Guan in the next few weeks .  She is medically cleared and considered low risk for perioperative morbidity.

## 2016-07-02 LAB — METHYLMALONIC ACID, SERUM: Methylmalonic Acid, Quant: 217 nmol/L (ref 87–318)

## 2016-07-10 ENCOUNTER — Other Ambulatory Visit: Payer: Self-pay | Admitting: Internal Medicine

## 2016-07-14 ENCOUNTER — Telehealth: Payer: Self-pay

## 2016-07-14 NOTE — Telephone Encounter (Signed)
Pt called to rqst samples of Eliquis. Adv pt they will be left at the front desk for pick up.

## 2016-07-20 ENCOUNTER — Other Ambulatory Visit: Payer: Self-pay | Admitting: Internal Medicine

## 2016-07-20 ENCOUNTER — Ambulatory Visit
Admission: RE | Admit: 2016-07-20 | Discharge: 2016-07-20 | Disposition: A | Discharge status: Home / Self Care | Payer: Medicare Other | Source: Ambulatory Visit | Attending: Internal Medicine | Admitting: Internal Medicine

## 2016-07-20 DIAGNOSIS — Z1231 Encounter for screening mammogram for malignant neoplasm of breast: Secondary | ICD-10-CM | POA: Diagnosis not present

## 2016-07-26 ENCOUNTER — Telehealth: Payer: Self-pay | Admitting: Cardiovascular Disease

## 2016-07-26 NOTE — Telephone Encounter (Signed)
Pt called requesting eliquis 5mg . Samples of this drug were given to the patient, quantity 2 boxes, Lot Number [id number]  Left at front desk. Left message on pt home VM that eliquis ready for pickup

## 2016-08-06 ENCOUNTER — Encounter
Admission: RE | Admit: 2016-08-06 | Discharge: 2016-08-06 | Disposition: A | Discharge status: Home / Self Care | Payer: Medicare Other | Source: Ambulatory Visit | Attending: Orthopedic Surgery | Admitting: Orthopedic Surgery

## 2016-08-06 DIAGNOSIS — Z01818 Encounter for other preprocedural examination: Secondary | ICD-10-CM | POA: Insufficient documentation

## 2016-08-06 HISTORY — DX: Nausea with vomiting, unspecified: R11.2

## 2016-08-06 HISTORY — DX: Gastro-esophageal reflux disease without esophagitis: K21.9

## 2016-08-06 HISTORY — DX: Other specified postprocedural states: Z98.890

## 2016-08-06 LAB — SURGICAL PCR SCREEN
MRSA, PCR: NEGATIVE
Staphylococcus aureus: NEGATIVE

## 2016-08-06 NOTE — Patient Instructions (Signed)
  Your procedure is scheduled on: Mon. 08/16/16 Report to Day Surgery. To find out your arrival time please call [phone] between 1PM - 3PM on Friday 08/13/16.  Remember: Instructions that are not followed completely may result in serious medical risk, up to and including death, or upon the discretion of your surgeon and anesthesiologist your surgery may need to be rescheduled.    _x___ 1. Do not eat food or drink liquids after midnight. No gum chewing or hard candies.     _x___ 2. No Alcohol for 24 hours before or after surgery.   ____ 3. Do Not Smoke For 24 Hours Prior to Your Surgery.   ____ 4. Bring all medications with you on the day of surgery if instructed.    __x__ 5. Notify your doctor if there is any change in your medical condition     (cold, fever, infections).       Do not wear jewelry, make-up, hairpins, clips or nail polish.  Do not wear lotions, powders, or perfumes. You may wear deodorant.  Do not shave 48 hours prior to surgery. Men may shave face and neck.  Do not bring valuables to the hospital.    [facility] is not responsible for any belongings or valuables.               Contacts, dentures or bridgework may not be worn into surgery.  Leave your suitcase in the car. After surgery it may be brought to your room.  For patients admitted to the hospital, discharge time is determined by your                treatment team.   Patients discharged the day of surgery will not be allowed to drive home.   Please read over the following fact sheets that you were given:   MRSA Information and Surgical Site Infection Prevention   ____ Take these medicines the morning of surgery with A SIP OF WATER:    1. HYDROcodone-acetaminophen (NORCO/VICODIN) 5-325 MG tablet if needed  2. amiodarone (PACERONE) 200 MG tablet  3. amLODipine (NORVASC) 2.5 MG tablet  4.cetirizine (ZYRTEC) 10 MG tablet if needed  5.omeprazole (PRILOSEC) 40 MG capsule  6.  ____ Fleet Enema (as  directed)   __x__ Use CHG Soap as directed  ____ Use inhalers on the day of surgery  ____ Stop metformin 2 days prior to surgery    ____ Take 1/2 of usual insulin dose the night before surgery and none on the morning of surgery.   __x__ Stop Eloquis last dose on 8/25  ____ Stop Anti-inflammatories    ____ Stop supplements until after surgery.    ____ Bring C-Pap to the hospital.

## 2016-08-09 ENCOUNTER — Telehealth: Payer: Self-pay | Admitting: Cardiovascular Disease

## 2016-08-09 NOTE — Telephone Encounter (Signed)
Request for cardiac clearance for right knee scope @ [facility], Dr. Marry Guan received and placed in MD basket.

## 2016-08-10 NOTE — Pre-Procedure Instructions (Signed)
Cleared by dr Fletcher Anon low risk 08/09/16

## 2016-08-10 NOTE — Telephone Encounter (Signed)
Clearance for pt right knee scope faxed to PAT, [phone]

## 2016-08-16 ENCOUNTER — Ambulatory Visit: Payer: Medicare Other | Admitting: Anesthesiology

## 2016-08-16 ENCOUNTER — Encounter
Admission: RE | Disposition: A | Discharge status: Home / Self Care | Payer: Self-pay | Source: Ambulatory Visit | Attending: Orthopedic Surgery

## 2016-08-16 ENCOUNTER — Ambulatory Visit
Admission: RE | Admit: 2016-08-16 | Discharge: 2016-08-16 | Disposition: A | Discharge status: Home / Self Care | Payer: Medicare Other | Source: Ambulatory Visit | Attending: Orthopedic Surgery | Admitting: Orthopedic Surgery

## 2016-08-16 ENCOUNTER — Encounter: Payer: Self-pay | Admitting: Orthopedic Surgery

## 2016-08-16 DIAGNOSIS — Z800 Family history of malignant neoplasm of digestive organs: Secondary | ICD-10-CM | POA: Diagnosis not present

## 2016-08-16 DIAGNOSIS — Z79899 Other long term (current) drug therapy: Secondary | ICD-10-CM | POA: Insufficient documentation

## 2016-08-16 DIAGNOSIS — Z888 Allergy status to other drugs, medicaments and biological substances status: Secondary | ICD-10-CM | POA: Insufficient documentation

## 2016-08-16 DIAGNOSIS — Z8249 Family history of ischemic heart disease and other diseases of the circulatory system: Secondary | ICD-10-CM | POA: Diagnosis not present

## 2016-08-16 DIAGNOSIS — M23241 Derangement of anterior horn of lateral meniscus due to old tear or injury, right knee: Secondary | ICD-10-CM | POA: Insufficient documentation

## 2016-08-16 DIAGNOSIS — Z841 Family history of disorders of kidney and ureter: Secondary | ICD-10-CM | POA: Insufficient documentation

## 2016-08-16 DIAGNOSIS — I129 Hypertensive chronic kidney disease with stage 1 through stage 4 chronic kidney disease, or unspecified chronic kidney disease: Secondary | ICD-10-CM | POA: Diagnosis not present

## 2016-08-16 DIAGNOSIS — K449 Diaphragmatic hernia without obstruction or gangrene: Secondary | ICD-10-CM | POA: Insufficient documentation

## 2016-08-16 DIAGNOSIS — Z9049 Acquired absence of other specified parts of digestive tract: Secondary | ICD-10-CM | POA: Insufficient documentation

## 2016-08-16 DIAGNOSIS — Z833 Family history of diabetes mellitus: Secondary | ICD-10-CM | POA: Diagnosis not present

## 2016-08-16 DIAGNOSIS — Z803 Family history of malignant neoplasm of breast: Secondary | ICD-10-CM | POA: Insufficient documentation

## 2016-08-16 DIAGNOSIS — N189 Chronic kidney disease, unspecified: Secondary | ICD-10-CM | POA: Diagnosis not present

## 2016-08-16 DIAGNOSIS — M94261 Chondromalacia, right knee: Secondary | ICD-10-CM | POA: Diagnosis not present

## 2016-08-16 DIAGNOSIS — G4730 Sleep apnea, unspecified: Secondary | ICD-10-CM | POA: Insufficient documentation

## 2016-08-16 DIAGNOSIS — R002 Palpitations: Secondary | ICD-10-CM | POA: Insufficient documentation

## 2016-08-16 DIAGNOSIS — E785 Hyperlipidemia, unspecified: Secondary | ICD-10-CM | POA: Insufficient documentation

## 2016-08-16 DIAGNOSIS — K219 Gastro-esophageal reflux disease without esophagitis: Secondary | ICD-10-CM | POA: Insufficient documentation

## 2016-08-16 DIAGNOSIS — M23251 Derangement of posterior horn of lateral meniscus due to old tear or injury, right knee: Secondary | ICD-10-CM | POA: Insufficient documentation

## 2016-08-16 DIAGNOSIS — G43909 Migraine, unspecified, not intractable, without status migrainosus: Secondary | ICD-10-CM | POA: Diagnosis not present

## 2016-08-16 DIAGNOSIS — Z881 Allergy status to other antibiotic agents status: Secondary | ICD-10-CM | POA: Diagnosis not present

## 2016-08-16 DIAGNOSIS — M2391 Unspecified internal derangement of right knee: Secondary | ICD-10-CM | POA: Diagnosis not present

## 2016-08-16 DIAGNOSIS — M23221 Derangement of posterior horn of medial meniscus due to old tear or injury, right knee: Secondary | ICD-10-CM | POA: Insufficient documentation

## 2016-08-16 DIAGNOSIS — Z90710 Acquired absence of both cervix and uterus: Secondary | ICD-10-CM | POA: Diagnosis not present

## 2016-08-16 DIAGNOSIS — M2241 Chondromalacia patellae, right knee: Secondary | ICD-10-CM | POA: Diagnosis not present

## 2016-08-16 HISTORY — PX: KNEE ARTHROSCOPY: SHX127

## 2016-08-16 SURGERY — ARTHROSCOPY, KNEE
Anesthesia: General | Site: Knee | Laterality: Right | Wound class: Clean

## 2016-08-16 MED ORDER — KETAMINE HCL 50 MG/ML IJ SOLN
INTRAMUSCULAR | Status: DC | PRN
  Administered 2016-08-16: 3.5 mg via INTRAMUSCULAR
  Administered 2016-08-16: 25 mg via INTRAMUSCULAR

## 2016-08-16 MED ORDER — METOCLOPRAMIDE HCL 5 MG/ML IJ SOLN: 5.0000 mg | Freq: Three times a day (TID) | INTRAMUSCULAR | Status: DC | PRN

## 2016-08-16 MED ORDER — ONDANSETRON HCL 4 MG/2ML IJ SOLN
INTRAMUSCULAR | Status: DC | PRN
Start: 2016-08-16 — End: 2016-08-16
  Administered 2016-08-16: 4 mg via INTRAVENOUS

## 2016-08-16 MED ORDER — ACETAMINOPHEN 10 MG/ML IV SOLN
INTRAVENOUS | Status: DC | PRN
Start: 2016-08-16 — End: 2016-08-16
  Administered 2016-08-16: 1000 mg via INTRAVENOUS

## 2016-08-16 MED ORDER — MORPHINE SULFATE 4 MG/ML IJ SOLN
INTRAMUSCULAR | Status: DC | PRN
  Administered 2016-08-16: 26 mL via INTRA_ARTICULAR

## 2016-08-16 MED ORDER — LIDOCAINE HCL (CARDIAC) 20 MG/ML IV SOLN
INTRAVENOUS | Status: DC | PRN
  Administered 2016-08-16: 80 mg via INTRAVENOUS

## 2016-08-16 MED ORDER — MORPHINE SULFATE (PF) 4 MG/ML IV SOLN
INTRAVENOUS | Status: AC
  Filled 2016-08-16: qty 1

## 2016-08-16 MED ORDER — BUPIVACAINE-EPINEPHRINE (PF) 0.25% -1:200000 IJ SOLN
INTRAMUSCULAR | Status: AC
  Filled 2016-08-16: qty 30

## 2016-08-16 MED ORDER — METOCLOPRAMIDE HCL 10 MG PO TABS: 5.0000 mg | ORAL_TABLET | Freq: Three times a day (TID) | ORAL | Status: DC | PRN

## 2016-08-16 MED ORDER — HYDROCODONE-ACETAMINOPHEN 5-325 MG PO TABS
1.0000 | ORAL_TABLET | ORAL | Status: DC | PRN
  Administered 2016-08-16: 1 via ORAL

## 2016-08-16 MED ORDER — PROPOFOL 500 MG/50ML IV EMUL
INTRAVENOUS | Status: DC | PRN
  Administered 2016-08-16: 50 ug/kg/min via INTRAVENOUS

## 2016-08-16 MED ORDER — BUPIVACAINE-EPINEPHRINE 0.5% -1:200000 IJ SOLN
INTRAMUSCULAR | Status: DC | PRN
  Administered 2016-08-16: 5 mL

## 2016-08-16 MED ORDER — DEXAMETHASONE SODIUM PHOSPHATE 10 MG/ML IJ SOLN
INTRAMUSCULAR | Status: DC | PRN
  Administered 2016-08-16: 5 mg via INTRAVENOUS

## 2016-08-16 MED ORDER — ONDANSETRON HCL 4 MG/2ML IJ SOLN: 4.0000 mg | Freq: Four times a day (QID) | INTRAMUSCULAR | Status: DC | PRN

## 2016-08-16 MED ORDER — SODIUM CHLORIDE 0.9 % IV SOLN
INTRAVENOUS | Status: DC | PRN
  Administered 2016-08-16: 5 ug/kg/min via INTRAVENOUS

## 2016-08-16 MED ORDER — SODIUM CHLORIDE 0.9 % IV SOLN: INTRAVENOUS | Status: DC

## 2016-08-16 MED ORDER — HYDROCODONE-ACETAMINOPHEN 5-325 MG PO TABS
ORAL_TABLET | ORAL | Status: AC
  Filled 2016-08-16: qty 1

## 2016-08-16 MED ORDER — GLYCOPYRROLATE 0.2 MG/ML IJ SOLN
INTRAMUSCULAR | Status: DC | PRN
  Administered 2016-08-16: 0.2 mg via INTRAVENOUS

## 2016-08-16 MED ORDER — ACETAMINOPHEN 10 MG/ML IV SOLN
INTRAVENOUS | Status: AC
  Filled 2016-08-16: qty 100

## 2016-08-16 MED ORDER — HYDROCODONE-ACETAMINOPHEN 5-325 MG PO TABS: 1.0000 | ORAL_TABLET | ORAL | 0 refills | Status: DC | PRN

## 2016-08-16 MED ORDER — ONDANSETRON HCL 4 MG PO TABS: 4.0000 mg | ORAL_TABLET | Freq: Four times a day (QID) | ORAL | Status: DC | PRN

## 2016-08-16 MED ORDER — FENTANYL CITRATE (PF) 100 MCG/2ML IJ SOLN
INTRAMUSCULAR | Status: DC | PRN
  Administered 2016-08-16: 25 ug via INTRAVENOUS

## 2016-08-16 MED ORDER — FENTANYL CITRATE (PF) 100 MCG/2ML IJ SOLN: 25.0000 ug | INTRAMUSCULAR | Status: DC | PRN

## 2016-08-16 MED ORDER — PROPOFOL 10 MG/ML IV BOLUS
INTRAVENOUS | Status: DC | PRN
  Administered 2016-08-16: 140 mg via INTRAVENOUS
  Administered 2016-08-16: 35 mg via INTRAVENOUS

## 2016-08-16 MED ORDER — CEFAZOLIN SODIUM 1 G IJ SOLR
INTRAMUSCULAR | Status: DC | PRN
  Administered 2016-08-16: 2 g via INTRAMUSCULAR

## 2016-08-16 MED ORDER — LACTATED RINGERS IV SOLN
INTRAVENOUS | Status: DC | PRN
  Administered 2016-08-16: 11:00:00 via INTRAVENOUS

## 2016-08-16 MED ORDER — MIDAZOLAM HCL 2 MG/2ML IJ SOLN
INTRAMUSCULAR | Status: DC | PRN
  Administered 2016-08-16: 2 mg via INTRAVENOUS

## 2016-08-16 SURGICAL SUPPLY — 26 items
ADAPTER IRRIG TUBE 2 SPIKE SOL (ADAPTER) ×2 IMPLANT
ADPR TBG 2 SPK PMP STRL ASCP (ADAPTER) ×2
BLADE SHAVER 4.5 DBL SERAT CV (CUTTER) ×2 IMPLANT
BNDG ESMARK 6X12 TAN STRL LF (GAUZE/BANDAGES/DRESSINGS) ×2 IMPLANT
CUFF TOURN 24 STER (MISCELLANEOUS) ×1 IMPLANT
CUFF TOURN 30 STER DUAL PORT (MISCELLANEOUS) ×2 IMPLANT
DRSG DERMACEA 8X12 NADH (GAUZE/BANDAGES/DRESSINGS) ×2 IMPLANT
DURAPREP 26ML APPLICATOR (WOUND CARE) ×3 IMPLANT
GAUZE SPONGE 4X4 12PLY STRL (GAUZE/BANDAGES/DRESSINGS) ×2 IMPLANT
GLOVE BIO SURGEON STRL SZ7 (GLOVE) ×1 IMPLANT
GLOVE BIOGEL M STRL SZ7.5 (GLOVE) ×2 IMPLANT
GLOVE INDICATOR 8.0 STRL GRN (GLOVE) ×2 IMPLANT
GOWN STRL REUS W/ TWL LRG LVL3 (GOWN DISPOSABLE) ×2 IMPLANT
GOWN STRL REUS W/TWL LRG LVL3 (GOWN DISPOSABLE) ×4
IV LACTATED RINGER IRRG 3000ML (IV SOLUTION) ×8
IV LR IRRIG 3000ML ARTHROMATIC (IV SOLUTION) ×6 IMPLANT
KIT RM TURNOVER STRD PROC AR (KITS) ×2 IMPLANT
MANIFOLD NEPTUNE II (INSTRUMENTS) ×2 IMPLANT
PACK ARTHROSCOPY KNEE (MISCELLANEOUS) ×2 IMPLANT
SET TUBE SUCT SHAVER OUTFL 24K (TUBING) ×2 IMPLANT
SET TUBE TIP INTRA-ARTICULAR (MISCELLANEOUS) ×2 IMPLANT
SUT ETHILON 3-0 FS-10 30 BLK (SUTURE) ×2
SUTURE EHLN 3-0 FS-10 30 BLK (SUTURE) ×1 IMPLANT
TUBING ARTHRO INFLOW-ONLY STRL (TUBING) ×2 IMPLANT
WAND HAND CNTRL MULTIVAC 50 (MISCELLANEOUS) ×2 IMPLANT
WRAP KNEE W/COLD PACKS 25.5X14 (SOFTGOODS) ×2 IMPLANT

## 2016-08-16 NOTE — Anesthesia Procedure Notes (Signed)
Procedure Name: LMA Insertion Date/Time: 08/16/2016 11:30 AM Performed by: Aline Brochure Pre-anesthesia Checklist: Patient identified, Emergency Drugs available, Suction available and Patient being monitored Patient Re-evaluated:Patient Re-evaluated prior to inductionOxygen Delivery Method: Circle system utilized Preoxygenation: Pre-oxygenation with 100% oxygen Intubation Type: IV induction Ventilation: Mask ventilation without difficulty LMA: LMA inserted LMA Size: 4.0 Number of attempts: 1

## 2016-08-16 NOTE — Transfer of Care (Signed)
Immediate Anesthesia Transfer of Care Note  [name]  Procedure(s) Performed: Procedure(s): ARTHROSCOPY KNEE (Right)  Patient Location: PACU  Anesthesia Type:General  Level of Consciousness: sedated  Airway & Oxygen Therapy: Patient Spontanous Breathing and Patient connected to nasal cannula oxygen  Post-op Assessment: Report given to RN and Post -op Vital signs reviewed and stable  Post vital signs: Reviewed and stable  Last Vitals:  Vitals:   08/16/16 1009 08/16/16 1315  BP: (!) 125/57 (!) 116/54  Pulse: 60 64  Resp: 18 18  Temp: (!) 35.9 C 36.1 C    Last Pain:  Vitals:   08/16/16 1009  TempSrc: Tympanic         Complications: No apparent anesthesia complications

## 2016-08-16 NOTE — Anesthesia Postprocedure Evaluation (Signed)
Anesthesia Post Note  [name]  Procedure(s) Performed: Procedure(s) (LRB): ARTHROSCOPY KNEE, tear posterior horn medial meniscus, tear anterior and posterior horns of lateral meniscus, chondromalacia of lateral compartment grade 3 patella and grade 4 medial (Right)  Patient location during evaluation: PACU Anesthesia Type: General Level of consciousness: awake and alert Pain management: pain level controlled Vital Signs Assessment: post-procedure vital signs reviewed and stable Respiratory status: spontaneous breathing, nonlabored ventilation, respiratory function stable and patient connected to nasal cannula oxygen Cardiovascular status: blood pressure returned to baseline and stable Postop Assessment: no signs of nausea or vomiting Anesthetic complications: no    Last Vitals:  Vitals:   08/16/16 1443 08/16/16 1509  BP: (!) 125/54 127/61  Pulse: 60 (!) 57  Resp: 15 16  Temp:      Last Pain:  Vitals:   08/16/16 1443  TempSrc:   PainSc: 3                  Precious Haws Aisa Schoeppner

## 2016-08-16 NOTE — Discharge Instructions (Signed)
°  Instructions after Knee Arthroscopy  ° °- James P. Hooten, Jr., M.D.    ° Dept. of Orthopaedics & Sports Medicine ° [facility] ° [address] ° [place], [place]  [address] ° ° Phone: [phone]   Fax: [phone] ° ° °DIET: °• Drink plenty of non-alcoholic fluids & begin a light diet. °• Resume your normal diet the day after surgery. ° °ACTIVITY:  °• You may use crutches or a walker with weight-bearing as tolerated, unless instructed otherwise. °• You may wean yourself off of the walker or crutches as tolerated.  °• Begin doing gentle exercises. Exercising will reduce the pain and swelling, increase motion, and prevent muscle weakness.   °• Avoid strenuous activities or athletics for a minimum of 4-6 weeks after arthroscopic surgery. °• Do not drive or operate any equipment until instructed. ° °WOUND CARE:  °• Place one to two pillows under the knee the first day or two when sitting or lying.  °• Continue to use the ice packs periodically to reduce pain and swelling. °• The small incisions in your knee are closed with nylon stitches. The stitches will be removed in the office. °• The bulky dressing may be removed on the second day after surgery. DO NOT TOUCH THE STITCHES. Put a Band-Aid over each stitch. Do NOT use any ointments or creams on the incisions.  °• You may bathe or shower after the stitches are removed at the first office visit following surgery. ° °MEDICATIONS: °• You may resume your regular medications. °• Please take the pain medication as prescribed. °• Do not take pain medication on an empty stomach. °• Do not drive or drink alcoholic beverages when taking pain medications. ° °CALL THE OFFICE FOR: °• Temperature above 101 degrees °• Excessive bleeding or drainage on the dressing. °• Excessive swelling, coldness, or paleness of the toes. °• Persistent nausea and vomiting. ° °FOLLOW-UP:  °• You should have an appointment to return to the office in 7-10 days after surgery.   ° ° °AMBULATORY SURGERY  °DISCHARGE INSTRUCTIONS ° ° °1) The drugs that you were given will stay in your system until tomorrow so for the next 24 hours you should not: ° °A) Drive an automobile °B) Make any legal decisions °C) Drink any alcoholic beverage ° ° °2) You may resume regular meals tomorrow.  Today it is better to start with liquids and gradually work up to solid foods. ° °You may eat anything you prefer, but it is better to start with liquids, then soup and crackers, and gradually work up to solid foods. ° ° °3) Please notify your doctor immediately if you have any unusual bleeding, trouble breathing, redness and pain at the surgery site, drainage, fever, or pain not relieved by medication. ° ° ° °4) Additional Instructions: ° ° ° ° ° ° ° °Please contact your physician with any problems or Same Day Surgery at [phone], Monday through Friday 6 am to 4 pm, or [facility] [place] Main number at [phone]. ° °

## 2016-08-16 NOTE — Op Note (Signed)
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   ASSISTANT: none  ANESTHESIA: general  ESTIMATED BLOOD LOSS: Minimal  FLUIDS REPLACED: 900 mL of crystalloid  TOURNIQUET TIME: Not used   DRAINS: none  IMPLANTS UTILIZED: None  INDICATIONS FOR SURGERY: [name] [name] is a 80 y.o. year old female who has been seen for complaints of right knee pain. MRI demonstrated findings consistent with meniscal pathology. After discussion of the risks and benefits of surgical intervention, the patient expressed understanding of the risks benefits and agree with plans for right knee arthroscopy.   PROCEDURE IN DETAIL: The patient was brought into the operating room and, after adequate general anesthesia was achieved, a tourniquet was applied to the right thigh and the leg was placed in the leg holder. All bony prominences were well padded. The patient's right knee was cleaned and prepped with alcohol and Duraprep and draped in the usual sterile fashion. A "timeout" was performed as per usual protocol. The anticipated portal sites were injected with 0.25% Marcaine with epinephrine. An anterolateral incision was made and a cannula was inserted. A moderate effusion was evacuated and the knee was distended with fluid using the pump. The scope was advanced down the medial gutter into the medial compartment. Under visualization with the  scope, an anteromedial portal was created and a hooked probe was inserted. The medial meniscus was visualized and probed. There was a complex degenerative tear of the posterior horn of the medial meniscus. The tear was debrided using a combination of meniscal punches and a 4.5 mm incisor shaver. Final contouring was performed using the 50 ArthroCare wand. The remaining rim of meniscus was probed and felt to be stable. The articular cartilage was visualized. There was some fibrillation of the articular cartilage consistent with grade 3 chondromalacia. These areas were debrided and contoured using the ArthroCare wand.  The scope was then advanced into the intercondylar notch. The anterior cruciate ligament was visualized and probed and felt to be intact. The scope was removed from the lateral portal and reinserted via the anteromedial portal to better visualize the lateral compartment. The lateral meniscus was visualized and probed. A complex degenerative tears of both the anterior and posterior horns were noted. Most of the anterior horn was involved. The tears were debrided using meniscal punches and a 4.5 mm incisor shaver. Final contouring was performed using the ArthroCare wand. The articular cartilage of the lateral compartment was visualized. Full-thickness loss of articular cartilage was noted to the areas of the lateral femoral condyle and lateral tibial plateau consistent with grade 4 chondromalacia. The margins were debrided and contoured using the ArthroCare wand. Finally, the scope was advanced so as to visualize the patellofemoral articulation. Good patellar tracking was appreciated. Grade 3 changes of chondromalacia from the noted to the patellofemoral articulation these areas were debrided using the ArthroCare wand.  The knee was irrigated with copius amounts of fluid and suctioned dry. The anterolateral  portal was re-approximated with #3-0 nylon. A combination of 0.25% Marcaine with epinephrine  and 4 mg of Morphine were injected via the scope. The scope was removed and the anteromedial portal was re-approximated with #3-0 nylon. A sterile dressing was applied followed by application of an ice wrap.  The patient tolerated the procedure well and was transported to the PACU in stable condition.  [name] [name] P. [name] [name]., M.D.

## 2016-08-16 NOTE — Brief Op Note (Signed)
08/16/2016  1:13 PM  PATIENT:  [name] [name]  80 y.o. 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, right knee Grade 3 chondromalacia of the medial and patellofemoral compartments, right knee  PROCEDURE:  Right knee arthroscopy, partial medial and lateral meniscectomies, and chondroplasty  SURGEON:  Surgeon(s) and Role:    * Dereck Leep, MD - Primary  ASSISTANTS: none   ANESTHESIA:   general  EBL:  Total I/O In: 900 [I.V.:900] Out: 2 [Blood:2]  BLOOD ADMINISTERED:none  DRAINS: none   LOCAL MEDICATIONS USED:  MARCAINE     SPECIMEN:  No Specimen  DISPOSITION OF SPECIMEN:  N/A  COUNTS:  YES  TOURNIQUET:   not used  DICTATION: .Sales executive  PLAN OF CARE: Discharge to home after PACU  PATIENT DISPOSITION:  PACU - hemodynamically stable.   Delay start of Pharmacological VTE agent (>24hrs) due to surgical blood loss or risk of bleeding: not applicable

## 2016-08-16 NOTE — H&P (Signed)
The patient has been re-examined, and the chart reviewed, and there have been no interval changes to the documented history and physical.    The risks, benefits, and alternatives have been discussed at length. The patient expressed understanding of the risks benefits and agreed with plans for surgical intervention.  [name] [name], Jr. M.D.    

## 2016-08-16 NOTE — Anesthesia Preprocedure Evaluation (Signed)
Anesthesia Evaluation  Patient identified by MRN, date of birth, ID band Patient awake    Reviewed: Allergy & Precautions, H&P , NPO status , Patient's Chart, lab work & pertinent test results  History of Anesthesia Complications (+) PONV and history of anesthetic complications  Airway Mallampati: III  TM Distance: <3 FB Neck ROM: limited    Dental no notable dental hx. (+) Poor Dentition, Chipped   Pulmonary neg shortness of breath,    Pulmonary exam normal breath sounds clear to auscultation       Cardiovascular Exercise Tolerance: Good hypertension, (-) angina(-) Past MI and (-) DOE Normal cardiovascular exam+ dysrhythmias Atrial Fibrillation  Rhythm:regular Rate:Normal     Neuro/Psych  Headaches, PSYCHIATRIC DISORDERS  Neuromuscular disease    GI/Hepatic Neg liver ROS, GERD  Controlled,  Endo/Other  negative endocrine ROS  Renal/GU Renal disease     Musculoskeletal  (+) Arthritis ,   Abdominal   Peds  Hematology negative hematology ROS (+)   Anesthesia Other Findings Past Medical History: No date: Atrial fibrillation (HCC)     Comment: Paroxysmal, hx of 02/2011: Atrial flutter (HCC)     Comment: s/p cardioversion  No date: Cardiomyopathy No date: Chronic kidney disease     Comment: acute renal failure secondary to dehydration               which is now resolved No date: Cystocele No date: Dyspnea No date: Dysrhythmia No date: GERD (gastroesophageal reflux disease) No date: Headache(784.0)     Comment: chronic No date: Hyperlipidemia No date: Hypertension No date: Knee fracture No date: Light headedness     Comment: due to dehydration No date: PONV (postoperative nausea and vomiting)     Comment: oxycodone and codiene cause N/V  No date: Pre-syncope  Past Surgical History: 1990: ABDOMINAL HYSTERECTOMY No date: APPENDECTOMY 1986: AUGMENTATION MAMMAPLASTY Bilateral     Comment: implants 1990:  AUGMENTATION MAMMAPLASTY 2011: AUGMENTATION MAMMAPLASTY No date: CARDIAC CATHETERIZATION No date: CARDIOVERSION     Comment: x 3 No date: CARDIOVERSION No date: CHOLECYSTECTOMY No date: COMBINED AUGMENTATION MAMMAPLASTY AND ABDOMINO* 06/04/2013: JOINT REPLACEMENT Left     Comment: left knee 1986: MASTECTOMY     Comment: Bilateral with silicone  breast implants, s/p               saline replacements No date: Multiple orthopedic procedures No date: TOTAL KNEE ARTHROPLASTY Left     Reproductive/Obstetrics negative OB ROS                             Anesthesia Physical Anesthesia Plan  ASA: III  Anesthesia Plan: General LMA   Post-op Pain Management:    Induction:   Airway Management Planned:   Additional Equipment:   Intra-op Plan:   Post-operative Plan:   Informed Consent: I have reviewed the patients History and Physical, chart, labs and discussed the procedure including the risks, benefits and alternatives for the proposed anesthesia with the patient or authorized representative who has indicated his/her understanding and acceptance.     Plan Discussed with: Anesthesiologist, CRNA and Surgeon  Anesthesia Plan Comments:         Anesthesia Quick Evaluation

## 2016-08-25 ENCOUNTER — Other Ambulatory Visit: Payer: Self-pay | Admitting: Internal Medicine

## 2016-08-25 NOTE — Telephone Encounter (Signed)
This is a historical med, is this ok to fill?

## 2016-08-26 ENCOUNTER — Encounter: Payer: Self-pay | Admitting: Emergency Medicine

## 2016-08-26 ENCOUNTER — Emergency Department: Payer: Medicare Other

## 2016-08-26 ENCOUNTER — Emergency Department
Admission: EM | Admit: 2016-08-26 | Discharge: 2016-08-26 | Disposition: A | Discharge status: Home / Self Care | Payer: Medicare Other | Attending: Emergency Medicine | Admitting: Emergency Medicine

## 2016-08-26 DIAGNOSIS — Y929 Unspecified place or not applicable: Secondary | ICD-10-CM | POA: Diagnosis not present

## 2016-08-26 DIAGNOSIS — Y999 Unspecified external cause status: Secondary | ICD-10-CM | POA: Diagnosis not present

## 2016-08-26 DIAGNOSIS — Z96652 Presence of left artificial knee joint: Secondary | ICD-10-CM | POA: Diagnosis not present

## 2016-08-26 DIAGNOSIS — S0083XA Contusion of other part of head, initial encounter: Secondary | ICD-10-CM | POA: Insufficient documentation

## 2016-08-26 DIAGNOSIS — I129 Hypertensive chronic kidney disease with stage 1 through stage 4 chronic kidney disease, or unspecified chronic kidney disease: Secondary | ICD-10-CM | POA: Insufficient documentation

## 2016-08-26 DIAGNOSIS — Z7901 Long term (current) use of anticoagulants: Secondary | ICD-10-CM | POA: Insufficient documentation

## 2016-08-26 DIAGNOSIS — R51 Headache: Secondary | ICD-10-CM | POA: Diagnosis not present

## 2016-08-26 DIAGNOSIS — Y9389 Activity, other specified: Secondary | ICD-10-CM | POA: Diagnosis not present

## 2016-08-26 DIAGNOSIS — Z79899 Other long term (current) drug therapy: Secondary | ICD-10-CM | POA: Diagnosis not present

## 2016-08-26 DIAGNOSIS — W208XXA Other cause of strike by thrown, projected or falling object, initial encounter: Secondary | ICD-10-CM | POA: Insufficient documentation

## 2016-08-26 DIAGNOSIS — S0990XA Unspecified injury of head, initial encounter: Secondary | ICD-10-CM | POA: Diagnosis present

## 2016-08-26 DIAGNOSIS — S060X0A Concussion without loss of consciousness, initial encounter: Secondary | ICD-10-CM | POA: Diagnosis not present

## 2016-08-26 DIAGNOSIS — N189 Chronic kidney disease, unspecified: Secondary | ICD-10-CM | POA: Diagnosis not present

## 2016-08-26 NOTE — Telephone Encounter (Signed)
Replaces protonix as daily PPI

## 2016-08-26 NOTE — ED Provider Notes (Signed)
[facility] Emergency Department Provider Note   ____________________________________________   First MD Initiated Contact with Patient 08/26/16 [phone]     (approximate)  I have reviewed the triage vital signs and the nursing notes.   HISTORY  Chief Complaint Head Injury   HPI [name] [name] is a 80 y.o. female with a history of atrial fibrillation on eliquis was presenting to the emergency Department after head trauma. She said that 2 days ago she moved a ladder that had a piece of metal on top of it. She said that when she shifted the latter the metal fell on her head. She did not lose consciousness. She did not have any symptoms after that. However, yesterday, she was getting groceries out of the trunk of her car when the wind blew forcing the trunk of the car down to the top of her head. She says ever since this injury that she has been feeling weak with mild dizziness. She came to the emergency department for further evaluation. She did sustain ecchymosis to the front of her head after the piece of metal bruised her 2 days ago.   Past Medical History:  Diagnosis Date  . Atrial fibrillation (HCC)    Paroxysmal, hx of  . Atrial flutter ([place]) 02/2011   s/p cardioversion   . Cardiomyopathy   . Chronic kidney disease    acute renal failure secondary to dehydration which is now resolved  . Cystocele   . Dyspnea   . Dysrhythmia   . GERD (gastroesophageal reflux disease)   . Headache(784.0)    chronic  . Hyperlipidemia   . Hypertension   . Knee fracture   . Light headedness    due to dehydration  . PONV (postoperative nausea and vomiting)    oxycodone and codiene cause N/V   . Pre-syncope     Patient Active Problem List   Diagnosis Date Noted  . Preoperative clearance 06/29/2016  . Other fatigue 06/29/2016  . Visit for preventive health examination 12/15/2015  . GERD (gastroesophageal reflux disease) 11/20/2015  . Cystocele 11/18/2015  .  Unstable bladder 11/18/2015  . Vaginal atrophy 11/18/2015  . Chronic suprapubic pain 10/14/2015  . Inguinal hernia 10/13/2015  . Impaired fasting glucose 04/24/2015  . Pulmonary hypertension, moderate to severe ([place]) 04/24/2015  . Arthritis of knee, degenerative 02/21/2015  . Vitamin D deficiency 12/27/2014  . S/P TAH-BSO 12/13/2014  . S/P bilateral mastectomy 12/13/2014  . Long term current use of anticoagulant therapy 09/25/2014  . HH (hiatus hernia) 04/20/2014  . 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 Unspecified vitamin D deficiency 04/24/2013  . Family history of breast cancer in female 04/23/2013  . Medicare annual wellness visit, subsequent 04/23/2013  . Obesity 06/30/2012  . History of [place] spotted fever 06/22/2012  . Persistent atrial fibrillation ([place]) 05/09/2012  . Anxiety and depression 05/09/2012  . TACHYCARDIA 02/01/2011  . Hyperlipidemia 07/08/2010  . Essential hypertension 03/05/2009    Past Surgical History:  Procedure Laterality Date  . ABDOMINAL HYSTERECTOMY  1990  . APPENDECTOMY    . AUGMENTATION MAMMAPLASTY Bilateral 1986   implants  . AUGMENTATION MAMMAPLASTY  1990  . AUGMENTATION MAMMAPLASTY  2011  . CARDIAC CATHETERIZATION    . CARDIOVERSION     x 3  . CARDIOVERSION    . CHOLECYSTECTOMY    . COMBINED AUGMENTATION MAMMAPLASTY AND ABDOMINOPLASTY    . JOINT REPLACEMENT Left 06/04/2013   left knee  . KNEE ARTHROSCOPY Right 08/16/2016  Procedure: ARTHROSCOPY KNEE, tear posterior horn medial meniscus, tear anterior and posterior horns of lateral meniscus, chondromalacia of lateral compartment grade 3 patella and grade 4 medial;  Surgeon: Dereck Leep, MD;  Location: [facility] ORS;  Service: Orthopedics;  Laterality: Right;  . MASTECTOMY  1986   Bilateral with silicone  breast implants, s/p saline replacements  . Multiple orthopedic procedures    . TOTAL KNEE ARTHROPLASTY Left     Prior to Admission medications   Medication Sig  Start Date End Date Taking? Authorizing Provider  acetaminophen (TYLENOL) 500 MG tablet Take 1,000 mg by mouth every 6 (six) hours as needed.    Historical Provider, MD  aluminum-magnesium hydroxide 200-200 MG/5ML suspension Take 15 mLs by mouth every 6 (six) hours as needed for indigestion.    Historical Provider, MD  amiodarone (PACERONE) 200 MG tablet TAKE 1/2 TABLET BY MOUTH EVERY DAY 05/07/16   Wellington Hampshire, MD  amLODipine (NORVASC) 2.5 MG tablet Take 2 tablets (5 mg total) by mouth daily. Patient taking differently: Take 5 mg by mouth daily. 2.5 in am and 5mg  in the evening 01/26/16   Wellington Hampshire, MD  apixaban (ELIQUIS) 5 MG TABS tablet Take 1 tablet (5 mg total) by mouth 2 (two) times daily. 01/30/16   Wellington Hampshire, MD  carboxymethylcellulose (REFRESH PLUS) 0.5 % SOLN Place 1 drop into both eyes daily as needed.    Historical Provider, MD  cetirizine (ZYRTEC) 10 MG tablet Take 10 mg by mouth daily as needed for allergies.    Historical Provider, MD  Cholecalciferol (VITAMIN D3) 1000 UNITS CAPS Take 1 capsule by mouth every morning.     Historical Provider, MD  diazepam (VALIUM) 5 MG tablet Take 5 mg by mouth at bedtime as needed for anxiety.    Historical Provider, MD  fluticasone (FLONASE) 50 MCG/ACT nasal spray Place 2 sprays into both nostrils daily. Patient taking differently: Place 2 sprays into both nostrils daily as needed for rhinitis.  12/29/15   Rubbie Battiest, NP  HYDROcodone-acetaminophen (NORCO) 5-325 MG tablet Take 1-2 tablets by mouth every 4 (four) hours as needed for moderate pain. 08/16/16   Dereck Leep, MD  HYDROcodone-acetaminophen (NORCO/VICODIN) 5-325 MG tablet Take 1 tablet by mouth every 6 (six) hours as needed for moderate pain. 03/18/16   [name], MD  omeprazole (PRILOSEC) 40 MG capsule Take 40 mg by mouth every morning.    Historical Provider, MD  pravastatin (PRAVACHOL) 20 MG tablet TAKE ONE (1) TABLET BY MOUTH EVERY DAY Patient taking differently:  TAKE ONE (1) TABLET BY MOUTH in the evening 07/11/16   [name], MD  sodium chloride (OCEAN) 0.65 % SOLN nasal spray Place 1 spray into both nostrils as needed for congestion.    Historical Provider, MD    Allergies Iodine; Biaxin [clarithromycin]; Codeine; Fluocinonide; Oxycodone; Promethazine; and Warfarin sodium  Family History  Problem Relation Age of Onset  . Heart disease Mother   . Cancer Father     stomach  . Heart disease Son     found at autopsy  . Cancer Sister     breast  . Breast cancer Sister   . Cancer Maternal Grandmother     breast  . Breast cancer Maternal Grandmother   . Cancer Sister     breast  . Breast cancer Sister   . Breast cancer Sister   . Diabetes Neg Hx     Social History Social History  Substance Use  Topics  . Smoking status: Never Smoker  . Smokeless tobacco: Never Used  . Alcohol use No    Review of Systems Constitutional: No fever/chills Eyes: No visual changes. ENT: No sore throat. Cardiovascular: Denies chest pain. Respiratory: Denies shortness of breath. Gastrointestinal: No abdominal pain.  No nausea, no vomiting.  No diarrhea.  No constipation. Genitourinary: Negative for dysuria. Musculoskeletal: Negative for back pain. Skin: Negative for rash. Neurological: Negative for focal weakness or numbness.  10-point ROS otherwise negative.  ____________________________________________   PHYSICAL EXAM:  VITAL SIGNS: ED Triage Vitals  Enc Vitals Group     BP 08/26/16 0929 (!) 127/59     Pulse Rate 08/26/16 0929 60     Resp 08/26/16 0929 16     Temp 08/26/16 0929 97.6 F (36.4 C)     Temp Source 08/26/16 0929 Oral     SpO2 08/26/16 0929 97 %     Weight 08/26/16 0920 157 lb (71.2 kg)     Height 08/26/16 0920 5\' 4"  (1.626 m)     Head Circumference --      Peak Flow --      Pain Score 08/26/16 0920 7     Pain Loc --      Pain Edu? --      Excl. in [place]? --     Constitutional: Alert and oriented. Well appearing and  in no acute distress. Eyes: Conjunctivae are normal. PERRL. EOMI. Head: ALeft frontal ecchymosis about 3 x 4 cm without any depression or bogginess. Mild tenderness to palpation overlying. No ecchymosis or swelling to the top of the cranium where she said she sustained from the trunk of the car. Nose: No congestion/rhinnorhea. Mouth/Throat: Mucous membranes are moist.   Neck: No stridor.   Cardiovascular: Normal rate, regular rhythm. Grossly normal heart sounds.   Respiratory: Normal respiratory effort.  No retractions. Lungs CTAB. Gastrointestinal: Soft and nontender. No distention.  Musculoskeletal: No lower extremity tenderness.  Mild bilateral lower extremity edema with the right being greater than the left. The patient says that this is chronic. No joint effusions. Neurologic:  Normal speech and language. No gross focal neurologic deficits are appreciated. No nystagmus. No ataxia on finger to nose testing. No weakness or numbness. Skin:  Skin is warm, dry and intact. No rash noted. Psychiatric: Mood and affect are normal. Speech and behavior are normal.  ____________________________________________   LABS (all labs ordered are listed, but only abnormal results are displayed)  Labs Reviewed - No data to display ____________________________________________  EKG   ____________________________________________  RADIOLOGY  CT Head Wo Contrast (Accession [id number]) (Order [id number])  Imaging  Date: 08/26/2016 Department: [facility] EMERGENCY DEPARTMENT Released By/Authorizing: Orbie Pyo, MD (auto-released)  PACS Images   Show images for CT Head Wo Contrast  Study Result   CLINICAL DATA:  Posttraumatic headache and left forehead injury after ladder fell on it.  EXAM: CT HEAD WITHOUT CONTRAST  TECHNIQUE: Contiguous axial images were obtained from the base of the skull through the vertex without intravenous contrast.  COMPARISON:  CT  scan of January 11, 2015.  FINDINGS: Bony calvarium appears intact. Mild diffuse cortical atrophy is noted. No mass effect or midline shift is noted. Ventricular size is within normal limits. There is no evidence of mass lesion, hemorrhage or acute infarction.  IMPRESSION: Mild diffuse cortical atrophy. No acute intracranial abnormality seen.   Electronically Signed   By: Marijo Conception, M.D.   On: 08/26/2016 10:03  ____________________________________________   PROCEDURES  Procedure(s) performed:   Procedures  Critical Care performed:   ____________________________________________   INITIAL IMPRESSION / ASSESSMENT AND PLAN / ED COURSE  Pertinent labs & imaging results that were available during my care of the patient were reviewed by me and considered in my medical decision making (see chart for details).  ----------------------------------------- 10:58 AM on 08/26/2016 -----------------------------------------  Patient is resting comfortably. Very reassuring head CT. Patient with symptoms of concussion. Explained to the patient as well as her husband the results of the CAT scan as well as her diagnosis. We discussed techniques for brain rest. She says that she has had a concussion several years ago and understands the plan and is willing to comply and knows these precautions. She will return for any worsening or concerning symptoms. Will be discharged home.  Clinical Course     ____________________________________________   FINAL CLINICAL IMPRESSION(S) / ED DIAGNOSES  Ecchymosis. Concussion.    NEW MEDICATIONS STARTED DURING THIS VISIT:  New Prescriptions   No medications on file     Note:  This document was prepared using Dragon voice recognition software and may include unintentional dictation errors.    Orbie Pyo, MD 08/26/16 1058

## 2016-08-26 NOTE — ED Triage Notes (Signed)
Reports moving a ladder that had metal on it on Monday and the metal fell off onto her head.  Then last pm was putting groceries in the car and the wind blew the trunk door into her head.  Headache and nausea and drowsiness since

## 2016-08-26 NOTE — ED Notes (Signed)
Pt complains of a headache after getting hit on the head twice by a piece of metal than the car trunk, pt denies nausea or vomiting no neuro deficits

## 2016-08-31 ENCOUNTER — Ambulatory Visit (INDEPENDENT_AMBULATORY_CARE_PROVIDER_SITE_OTHER): Payer: Medicare Other | Admitting: Internal Medicine

## 2016-08-31 ENCOUNTER — Encounter: Payer: Self-pay | Admitting: Internal Medicine

## 2016-08-31 VITALS — BP 108/58 | HR 61 | Temp 97.6°F | Ht 64.0 in | Wt 157.5 lb

## 2016-08-31 DIAGNOSIS — I872 Venous insufficiency (chronic) (peripheral): Secondary | ICD-10-CM | POA: Diagnosis not present

## 2016-08-31 DIAGNOSIS — R600 Localized edema: Secondary | ICD-10-CM

## 2016-08-31 DIAGNOSIS — S060X0D Concussion without loss of consciousness, subsequent encounter: Secondary | ICD-10-CM | POA: Diagnosis not present

## 2016-08-31 MED ORDER — FUROSEMIDE 20 MG PO TABS: 20.0000 mg | ORAL_TABLET | Freq: Every day | ORAL | 1 refills | Status: DC

## 2016-08-31 NOTE — Progress Notes (Signed)
Subjective:  Patient ID: [name] [name], female    DOB: 11/28/1936  Age: 80 y.o. MRN: [name]  CC: The primary encounter diagnosis was Localized edema. Diagnoses of Concussion with no loss of consciousness, subsequent encounter and Chronic venous insufficiency were also pertinent to this visit.  HPI [name] [name] presents for [employer] issues  1) ER follow up on fall with blunt head trauma , 2 episodes,  Went to ER   On Eliquis   CT head negative.  Atrophy noted. Told she has a slight concussion.  Told to rest brain .  Has had some blurred vision and was told she had a cataract and has been referred to surgeon     2) left shoulder pain. Starts in shoulder blade and radiates down left shoulder. 3 days,   3) edema lower extremities  Right worse than left   Had knee surgery on right this summer.  Swelling preceded surgery.  Early Venous stasis changes noted to lower tibia.  Also taking 7.5 mg amlodipine daily.  Pitting edema 2+ no prior use of lasix .  Swelling a lot . Needs compression stockings,    4) Grief: brother in law Jimmy Izza [place]) died on April 12, 2023 morning at [facility] .   Outpatient Medications Prior to Visit  Medication Sig Dispense Refill  . acetaminophen (TYLENOL) 500 MG tablet Take 1,000 mg by mouth every 6 (six) hours as needed.    [name] aluminum-magnesium hydroxide 200-200 MG/5ML suspension Take 15 mLs by mouth every 6 (six) hours as needed for indigestion.    [name] amiodarone (PACERONE) 200 MG tablet TAKE 1/2 TABLET BY MOUTH EVERY DAY 15 tablet 6  . amLODipine (NORVASC) 2.5 MG tablet Take 2 tablets (5 mg total) by mouth daily. (Patient taking differently: Take 5 mg by mouth daily. 2.5 in am and 5mg  in the evening) 180 tablet 3  . apixaban (ELIQUIS) 5 MG TABS tablet Take 1 tablet (5 mg total) by mouth 2 (two) times daily. 60 tablet 6  . carboxymethylcellulose (REFRESH PLUS) 0.5 % SOLN Place 1 drop into both eyes daily as needed.    . cetirizine (ZYRTEC) 10 MG tablet Take 10 mg  by mouth daily as needed for allergies.    . Cholecalciferol (VITAMIN D3) 1000 UNITS CAPS Take 1 capsule by mouth every morning.     . diazepam (VALIUM) 5 MG tablet Take 5 mg by mouth at bedtime as needed for anxiety.    . fluticasone (FLONASE) 50 MCG/ACT nasal spray Place 2 sprays into both nostrils daily. (Patient taking differently: Place 2 sprays into both nostrils daily as needed for rhinitis. ) 16 g 0  . HYDROcodone-acetaminophen (NORCO) 5-325 MG tablet Take 1-2 tablets by mouth every 4 (four) hours as needed for moderate pain. 40 tablet 0  . omeprazole (PRILOSEC) 40 MG capsule TAKE ONE CAPSULE BY MOUTH DAILY 30 capsule 5  . pravastatin (PRAVACHOL) 20 MG tablet TAKE ONE (1) TABLET BY MOUTH EVERY DAY (Patient taking differently: TAKE ONE (1) TABLET BY MOUTH in the evening) 90 tablet 0  . sodium chloride (OCEAN) 0.65 % SOLN nasal spray Place 1 spray into both nostrils as needed for congestion.    [name] HYDROcodone-acetaminophen (NORCO/VICODIN) 5-325 MG tablet Take 1 tablet by mouth every 6 (six) hours as needed for moderate pain. 30 tablet 0  . omeprazole (PRILOSEC) 40 MG capsule Take 40 mg by mouth every morning.     No facility-administered medications prior to visit.     Review of  Systems;  Patient denies headache, fevers, malaise, unintentional weight loss, skin rash, eye pain, sinus congestion and sinus pain, sore throat, dysphagia,  hemoptysis , cough, dyspnea, wheezing, chest pain, palpitations, orthopnea, edema, abdominal pain, nausea, melena, diarrhea, constipation, flank pain, dysuria, hematuria, urinary  Frequency, nocturia, numbness, tingling, seizures,  Focal weakness, Loss of consciousness,  Tremor, insomnia, depression, anxiety, and suicidal ideation.      Objective:  BP (!) 108/58   Pulse 61   Temp 97.6 F (36.4 C) (Oral)   Ht 5\' 4"  (1.626 m)   Wt 157 lb 8 oz (71.4 kg)   SpO2 97%   BMI 27.03 kg/m   BP Readings from Last 3 Encounters:  08/31/16 (!) 108/58  08/26/16  120/62  08/16/16 127/61    Wt Readings from Last 3 Encounters:  08/31/16 157 lb 8 oz (71.4 kg)  08/26/16 157 lb (71.2 kg)  08/06/16 157 lb (71.2 kg)    General appearance: alert, cooperative and appears stated age Ears: normal TM's and external ear canals both ears Throat: lips, mucosa, and tongue normal; teeth and gums normal Neck: no adenopathy, no carotid bruit, supple, symmetrical, trachea midline and thyroid not enlarged, symmetric, no tenderness/mass/nodules Back: symmetric, no curvature. ROM normal. No CVA tenderness. Lungs: clear to auscultation bilaterally Heart: regular rate and rhythm, S1, S2 normal, no murmur, click, rub or gallop Abdomen: soft, non-tender; bowel sounds normal; no masses,  no organomegaly Pulses: 2+ and symmetric Skin: Skin color, texture, turgor normal. No rashes or lesions Lymph nodes: Cervical, supraclavicular, and axillary nodes normal.  Lab Results  Component Value Date   HGBA1C 5.8 06/28/2016   HGBA1C 5.9 10/14/2015   HGBA1C 6.3 01/11/2015    Lab Results  Component Value Date   CREATININE 1.29 (H) 06/28/2016   CREATININE 1.08 (H) 04/22/2016   CREATININE 0.9 03/29/2016    Lab Results  Component Value Date   WBC 6.0 04/22/2016   HGB 13.2 03/29/2016   HCT 39.2 04/22/2016   PLT 241 04/22/2016   GLUCOSE 106 (H) 06/28/2016   CHOL 174 04/22/2015   TRIG 82.0 04/22/2015   HDL 61.00 04/22/2015   LDLCALC 97 04/22/2015   ALT 14 06/28/2016   AST 14 06/28/2016   NA 140 06/28/2016   K 4.4 06/28/2016   CL 106 06/28/2016   CREATININE 1.29 (H) 06/28/2016   BUN 25 (H) 06/28/2016   CO2 28 06/28/2016   TSH 2.03 04/22/2015   INR 1.6 11/05/2013   HGBA1C 5.8 06/28/2016   MICROALBUR 1.0 06/28/2016    Ct Head Wo Contrast  Result Date: 08/26/2016 CLINICAL DATA:  Posttraumatic headache and left forehead injury after ladder fell on it. EXAM: CT HEAD WITHOUT CONTRAST TECHNIQUE: Contiguous axial images were obtained from the base of the skull through  the vertex without intravenous contrast. COMPARISON:  CT scan of January 11, 2015. FINDINGS: Bony calvarium appears intact. Mild diffuse cortical atrophy is noted. No mass effect or midline shift is noted. Ventricular size is within normal limits. There is no evidence of mass lesion, hemorrhage or acute infarction. IMPRESSION: Mild diffuse cortical atrophy. No acute intracranial abnormality seen. Electronically Signed   By: Marijo Conception, M.D.   On: 08/26/2016 10:03    Assessment & Plan:   Problem List Items Addressed This Visit    Concussion with no loss of consciousness    ER records reviewed with patient. Neurologic exam normal.  No warning signs.  Brain rest recommended      Chronic venous insufficiency  Mechanics explained,  Venous compression stockings advised.       Relevant Medications   furosemide (LASIX) 20 MG tablet    Other Visit Diagnoses    Localized edema    -  Primary   Relevant Orders   For home use only DME Other see comment     A total of 25 minutes of face to face time was spent with patient more than half of which was spent in counselling about the above mentioned conditions  and coordination of care  I am having Ms. Belvedere start on furosemide. I am also having her maintain her Vitamin D3, fluticasone, amLODipine, apixaban, amiodarone, pravastatin, sodium chloride, diazepam, carboxymethylcellulose, cetirizine, acetaminophen, aluminum-magnesium hydroxide, HYDROcodone-acetaminophen, and omeprazole.  Meds ordered this encounter  Medications  . furosemide (LASIX) 20 MG tablet    Sig: Take 1 tablet (20 mg total) by mouth daily. As needed for edema not more than every other day    Dispense:  45 tablet    Refill:  1    Medications Discontinued During This Encounter  Medication Reason  . HYDROcodone-acetaminophen (NORCO/VICODIN) 5-325 MG tablet Completed Course  . omeprazole (PRILOSEC) 40 MG capsule Duplicate    Follow-up: Return in about 4 weeks (around  09/28/2016).   [name], MD

## 2016-08-31 NOTE — Progress Notes (Signed)
Pre visit review using our clinic review tool, if applicable. No additional management support is needed unless otherwise documented below in the visit note. 

## 2016-08-31 NOTE — Patient Instructions (Signed)
[name] [name] can try glucosamine for hiis joint pain;  no side effects    You have venous insufficiency and the rash is  Venous stasis dermatitis.  This rash can be treated with a steroid cream if it itches.  I have prescribed a fluid pill for you to take not more  than 2-3 times per week.  Take the furosemide in the morning.  It will make you urinate A LOT  You also need to start wearing compression stockings daily.  Take the prescription [facility] to  [facility],  Or check with [facility] to see if they can fit you  People who have a concussion need to rest their brain for 2-3 weeks.  That means no television,  No puzzoles,  No reading,  And NO FALLS OR JUMPING AROUND!   Venous Stasis or Chronic Venous Insufficiency Chronic venous insufficiency, also called venous stasis, is a condition that affects the veins in the legs. The condition prevents blood from being pumped through these veins effectively. Blood may no longer be pumped effectively from the legs back to the heart. This condition can range from mild to severe. With proper treatment, you should be able to continue with an active life. CAUSES  Chronic venous insufficiency occurs when the vein walls become stretched, weakened, or damaged or when valves within the vein are damaged. Some common causes of this include:  High blood pressure inside the veins (venous hypertension).  Increased blood pressure in the leg veins from long periods of sitting or standing.  A blood clot that blocks blood flow in a vein (deep vein thrombosis).  Inflammation of a superficial vein (phlebitis) that causes a blood clot to form. RISK FACTORS Various things can make you more likely to develop chronic venous insufficiency, including:  Family history of this condition.  Obesity.  Pregnancy.  Sedentary lifestyle.  Smoking.  Jobs requiring long periods of standing or sitting in one place.  Being a certain age. Women in their 50s and 43s and men in  their 46s are more likely to develop this condition. SIGNS AND SYMPTOMS  Symptoms may include:   Varicose veins.  Skin breakdown or ulcers.  Reddened or discolored skin on the leg.  Brown, smooth, tight, and painful skin just above the ankle, usually on the inside surface (lipodermatosclerosis).  Swelling. DIAGNOSIS  To diagnose this condition, your health care provider will take a medical history and do a physical exam. The following tests may be ordered to confirm the diagnosis:  Duplex ultrasound--A procedure that produces a picture of a blood vessel and nearby organs and also provides information on blood flow through the blood vessel.  Plethysmography--A procedure that tests blood flow.  A venogram, or venography--A procedure used to look at the veins using X-ray and dye. TREATMENT The goals of treatment are to help you return to an active life and to minimize pain or disability. Treatment will depend on the severity of the condition. Medical procedures may be needed for severe cases. Treatment options may include:   Use of compression stockings. These can help with symptoms and lower the chances of the problem getting worse, but they do not cure the problem.  Sclerotherapy--A procedure involving an injection of a material that "dissolves" the damaged veins. Other veins in the network of blood vessels take over the function of the damaged veins.  Surgery to remove the vein or cut off blood flow through the vein (vein stripping or laser ablation surgery).  Surgery to  repair a valve. HOME CARE INSTRUCTIONS   Wear compression stockings as directed by your health care provider.  Only take over-the-counter or prescription medicines for pain, discomfort, or fever as directed by your health care provider.  Follow up with your health care provider as directed. SEEK MEDICAL CARE IF:   You have redness, swelling, or increasing pain in the affected area.  You see a red streak or  line that extends up or down from the affected area.  You have a breakdown or loss of skin in the affected area, even if the breakdown is small.  You have an injury to the affected area. SEEK IMMEDIATE MEDICAL CARE IF:   You have an injury and open wound in the affected area.  Your pain is severe and does not improve with medicine.  You have sudden numbness or weakness in the foot or ankle below the affected area, or you have trouble moving your foot or ankle.  You have a fever or persistent symptoms for more than 2-3 days.  You have a fever and your symptoms suddenly get worse. MAKE SURE YOU:   Understand these instructions.  Will watch your condition.  Will get help right away if you are not doing well or get worse.   This information is not intended to replace advice given to you by your health care provider. Make sure you discuss any questions you have with your health care provider.   Document Released: 04/11/2007 Document Revised: 09/26/2013 Document Reviewed: 08/13/2013 Elsevier Interactive Patient Education 2016 [place], Adult A concussion, or closed-head injury, is a brain injury caused by a direct blow to the head or by a quick and sudden movement (jolt) of the head or neck. Concussions are usually not life-threatening. Even so, the effects of a concussion can be serious. If you have had a concussion before, you are more likely to experience concussion-like symptoms after a direct blow to the head.  CAUSES  Direct blow to the head, such as from running into another player during a soccer game, being hit in a fight, or hitting your head on a hard surface.  A jolt of the head or neck that causes the brain to move back and forth inside the skull, such as in a car crash. SIGNS AND SYMPTOMS The signs of a concussion can be hard to notice. Early on, they may be missed by you, family members, and health care providers. You may look fine but act or feel  differently. Symptoms are usually temporary, but they may last for days, weeks, or even longer. Some symptoms may appear right away while others may not show up for hours or days. Every head injury is different. Symptoms include:  Mild to moderate headaches that will not go away.  A feeling of pressure inside your head.  Having more trouble than usual:  Learning or remembering things you have heard.  Answering questions.  Paying attention or concentrating.  Organizing daily tasks.  Making decisions and solving problems.  Slowness in thinking, acting or reacting, speaking, or reading.  Getting lost or being easily confused.  Feeling tired all the time or lacking energy (fatigued).  Feeling drowsy.  Sleep disturbances.  Sleeping more than usual.  Sleeping less than usual.  Trouble falling asleep.  Trouble sleeping (insomnia).  Loss of balance or feeling lightheaded or dizzy.  Nausea or vomiting.  Numbness or tingling.  Increased sensitivity to:  Sounds.  Lights.  Distractions.  Vision problems or  eyes that tire easily.  Diminished sense of taste or smell.  Ringing in the ears.  Mood changes such as feeling sad or anxious.  Becoming easily irritated or angry for little or no reason.  Lack of motivation.  Seeing or hearing things other people do not see or hear (hallucinations). DIAGNOSIS Your health care provider can usually diagnose a concussion based on a description of your injury and symptoms. He or she will ask whether you passed out (lost consciousness) and whether you are having trouble remembering events that happened right before and during your injury. Your evaluation might include:  A brain scan to look for signs of injury to the brain. Even if the test shows no injury, you may still have a concussion.  Blood tests to be sure other problems are not present. TREATMENT  Concussions are usually treated in an emergency department, in urgent  care, or at a clinic. You may need to stay in the hospital overnight for further treatment.  Tell your health care provider if you are taking any medicines, including prescription medicines, over-the-counter medicines, and natural remedies. Some medicines, such as blood thinners (anticoagulants) and aspirin, may increase the chance of complications. Also tell your health care provider whether you have had alcohol or are taking illegal drugs. This information may affect treatment.  Your health care provider will send you home with important instructions to follow.  How fast you will recover from a concussion depends on many factors. These factors include how severe your concussion is, what part of your brain was injured, your age, and how healthy you were before the concussion.  Most people with mild injuries recover fully. Recovery can take time. In general, recovery is slower in older persons. Also, persons who have had a concussion in the past or have other medical problems may find that it takes longer to recover from their current injury. HOME CARE INSTRUCTIONS General Instructions  Carefully follow the directions your health care provider gave you.  Only take over-the-counter or prescription medicines for pain, discomfort, or fever as directed by your health care provider.  Take only those medicines that your health care provider has approved.  Do not drink alcohol until your health care provider says you are well enough to do so. Alcohol and certain other drugs may slow your recovery and can put you at risk of further injury.  If it is harder than usual to remember things, write them down.  If you are easily distracted, try to do one thing at a time. For example, do not try to watch TV while fixing dinner.  Talk with family members or close friends when making important decisions.  Keep all follow-up appointments. Repeated evaluation of your symptoms is recommended for your  recovery.  Watch your symptoms and tell others to do the same. Complications sometimes occur after a concussion. Older adults with a brain injury may have a higher risk of serious complications, such as a blood clot on the brain.  Tell your teachers, school nurse, school counselor, coach, athletic trainer, or work Freight forwarder about your injury, symptoms, and restrictions. Tell them about what you can or cannot do. They should watch for:  Increased problems with attention or concentration.  Increased difficulty remembering or learning new information.  Increased time needed to complete tasks or assignments.  Increased irritability or decreased ability to cope with stress.  Increased symptoms.  Rest. Rest helps the brain to heal. Make sure you:  Get plenty of sleep at  night. Avoid staying up late at night.  Keep the same bedtime hours on weekends and weekdays.  Rest during the day. Take daytime naps or rest breaks when you feel tired.  Limit activities that require a lot of thought or concentration. These include:  Doing homework or job-related work.  Watching TV.  Working on the computer.  Avoid any situation where there is potential for another head injury (football, hockey, soccer, basketball, martial arts, downhill snow sports and horseback riding). Your condition will get worse every time you experience a concussion. You should avoid these activities until you are evaluated by the appropriate follow-up health care providers. Returning To Your Regular Activities You will need to return to your normal activities slowly, not all at once. You must give your body and brain enough time for recovery.  Do not return to sports or other athletic activities until your health care provider tells you it is safe to do so.  Ask your health care provider when you can drive, ride a bicycle, or operate heavy machinery. Your ability to react may be slower after a brain injury. Never do these  activities if you are dizzy.  Ask your health care provider about when you can return to work or school. Preventing Another Concussion It is very important to avoid another brain injury, especially before you have recovered. In rare cases, another injury can lead to permanent brain damage, brain swelling, or death. The risk of this is greatest during the first 7-10 days after a head injury. Avoid injuries by:  Wearing a seat belt when riding in a car.  Drinking alcohol only in moderation.  Wearing a helmet when biking, skiing, skateboarding, skating, or doing similar activities.  Avoiding activities that could lead to a second concussion, such as contact or recreational sports, until your health care provider says it is okay.  Taking safety measures in your home.  Remove clutter and tripping hazards from floors and stairways.  Use grab bars in bathrooms and handrails by stairs.  Place non-slip mats on floors and in bathtubs.  Improve lighting in dim areas. SEEK MEDICAL CARE IF:  You have increased problems paying attention or concentrating.  You have increased difficulty remembering or learning new information.  You need more time to complete tasks or assignments than before.  You have increased irritability or decreased ability to cope with stress.  You have more symptoms than before. Seek medical care if you have any of the following symptoms for more than 2 weeks after your injury:  Lasting (chronic) headaches.  Dizziness or balance problems.  Nausea.  Vision problems.  Increased sensitivity to noise or light.  Depression or mood swings.  Anxiety or irritability.  Memory problems.  Difficulty concentrating or paying attention.  Sleep problems.  Feeling tired all the time. SEEK IMMEDIATE MEDICAL CARE IF:  You have severe or worsening headaches. These may be a sign of a blood clot in the brain.  You have weakness (even if only in one hand, leg, or part of  the face).  You have numbness.  You have decreased coordination.  You vomit repeatedly.  You have increased sleepiness.  One pupil is larger than the other.  You have convulsions.  You have slurred speech.  You have increased confusion. This may be a sign of a blood clot in the brain.  You have increased restlessness, agitation, or irritability.  You are unable to recognize people or places.  You have neck pain.  It is  difficult to wake you up.  You have unusual behavior changes.  You lose consciousness. MAKE SURE YOU:  Understand these instructions.  Will watch your condition.  Will get help right away if you are not doing well or get worse.   This information is not intended to replace advice given to you by your health care provider. Make sure you discuss any questions you have with your health care provider.   Document Released: 02/26/2004 Document Revised: 12/27/2014 Document Reviewed: 06/28/2013 Elsevier Interactive Patient Education [employer].

## 2016-09-01 DIAGNOSIS — S060X0A Concussion without loss of consciousness, initial encounter: Secondary | ICD-10-CM | POA: Insufficient documentation

## 2016-09-01 DIAGNOSIS — I872 Venous insufficiency (chronic) (peripheral): Secondary | ICD-10-CM | POA: Insufficient documentation

## 2016-09-01 HISTORY — DX: Concussion without loss of consciousness, initial encounter: S06.0X0A

## 2016-09-01 NOTE — Assessment & Plan Note (Addendum)
ER records reviewed with patient. Neurologic exam normal.  No warning signs.  Brain rest recommended

## 2016-09-01 NOTE — Assessment & Plan Note (Signed)
Mechanics explained,  Venous compression stockings advised.

## 2016-09-07 DIAGNOSIS — Z86010 Personal history of colonic polyps: Secondary | ICD-10-CM | POA: Diagnosis not present

## 2016-09-07 DIAGNOSIS — Z7901 Long term (current) use of anticoagulants: Secondary | ICD-10-CM | POA: Diagnosis not present

## 2016-09-07 DIAGNOSIS — I480 Paroxysmal atrial fibrillation: Secondary | ICD-10-CM | POA: Diagnosis not present

## 2016-09-09 DIAGNOSIS — H35371 Puckering of macula, right eye: Secondary | ICD-10-CM | POA: Diagnosis not present

## 2016-09-16 DIAGNOSIS — H2511 Age-related nuclear cataract, right eye: Secondary | ICD-10-CM | POA: Diagnosis not present

## 2016-09-20 ENCOUNTER — Encounter: Payer: Self-pay | Admitting: *Deleted

## 2016-09-20 DIAGNOSIS — L821 Other seborrheic keratosis: Secondary | ICD-10-CM | POA: Diagnosis not present

## 2016-09-21 ENCOUNTER — Encounter
Admission: RE | Disposition: A | Discharge status: Home / Self Care | Payer: Self-pay | Source: Ambulatory Visit | Attending: Ophthalmology

## 2016-09-21 ENCOUNTER — Ambulatory Visit: Payer: Medicare Other | Admitting: Certified Registered Nurse Anesthetist

## 2016-09-21 ENCOUNTER — Encounter: Payer: Self-pay | Admitting: *Deleted

## 2016-09-21 ENCOUNTER — Ambulatory Visit
Admission: RE | Admit: 2016-09-21 | Discharge: 2016-09-21 | Disposition: A | Discharge status: Home / Self Care | Payer: Medicare Other | Source: Ambulatory Visit | Attending: Ophthalmology | Admitting: Ophthalmology

## 2016-09-21 DIAGNOSIS — I4891 Unspecified atrial fibrillation: Secondary | ICD-10-CM | POA: Diagnosis not present

## 2016-09-21 DIAGNOSIS — Z79899 Other long term (current) drug therapy: Secondary | ICD-10-CM | POA: Diagnosis not present

## 2016-09-21 DIAGNOSIS — I10 Essential (primary) hypertension: Secondary | ICD-10-CM | POA: Diagnosis not present

## 2016-09-21 DIAGNOSIS — I129 Hypertensive chronic kidney disease with stage 1 through stage 4 chronic kidney disease, or unspecified chronic kidney disease: Secondary | ICD-10-CM | POA: Diagnosis not present

## 2016-09-21 DIAGNOSIS — H2511 Age-related nuclear cataract, right eye: Secondary | ICD-10-CM | POA: Insufficient documentation

## 2016-09-21 DIAGNOSIS — K219 Gastro-esophageal reflux disease without esophagitis: Secondary | ICD-10-CM | POA: Diagnosis not present

## 2016-09-21 DIAGNOSIS — K449 Diaphragmatic hernia without obstruction or gangrene: Secondary | ICD-10-CM | POA: Insufficient documentation

## 2016-09-21 DIAGNOSIS — N189 Chronic kidney disease, unspecified: Secondary | ICD-10-CM | POA: Diagnosis not present

## 2016-09-21 DIAGNOSIS — E7800 Pure hypercholesterolemia, unspecified: Secondary | ICD-10-CM | POA: Diagnosis not present

## 2016-09-21 HISTORY — DX: Unspecified osteoarthritis, unspecified site: M19.90

## 2016-09-21 HISTORY — PX: CATARACT EXTRACTION W/PHACO: SHX586

## 2016-09-21 HISTORY — DX: Dizziness and giddiness: R42

## 2016-09-21 SURGERY — PHACOEMULSIFICATION, CATARACT, WITH IOL INSERTION
Anesthesia: Monitor Anesthesia Care | Site: Eye | Laterality: Right | Wound class: Clean

## 2016-09-21 MED ORDER — SODIUM CHLORIDE 0.9 % IV SOLN
INTRAVENOUS | Status: DC
  Administered 2016-09-21: 09:00:00 via INTRAVENOUS

## 2016-09-21 MED ORDER — NA CHONDROIT SULF-NA HYALURON 40-17 MG/ML IO SOLN
INTRAOCULAR | Status: AC
  Filled 2016-09-21: qty 1

## 2016-09-21 MED ORDER — TETRACAINE HCL 0.5 % OP SOLN
1.0000 [drp] | Freq: Once | OPHTHALMIC | Status: AC
  Administered 2016-09-21: 1 [drp] via OPHTHALMIC

## 2016-09-21 MED ORDER — MIDAZOLAM HCL 2 MG/2ML IJ SOLN
INTRAMUSCULAR | Status: DC | PRN
Start: 2016-09-21 — End: 2016-09-21
  Administered 2016-09-21: 1 mg via INTRAVENOUS

## 2016-09-21 MED ORDER — MOXIFLOXACIN HCL 0.5 % OP SOLN
1.0000 [drp] | Freq: Once | OPHTHALMIC | Status: AC
  Administered 2016-09-21: 1 [drp] via OPHTHALMIC

## 2016-09-21 MED ORDER — TETRACAINE HCL 0.5 % OP SOLN
OPHTHALMIC | Status: AC
  Filled 2016-09-21: qty 2

## 2016-09-21 MED ORDER — ARMC OPHTHALMIC DILATING DROPS
1.0000 "application " | OPHTHALMIC | Status: AC
  Administered 2016-09-21 (×3): 1 via OPHTHALMIC
  Filled 2016-09-21: qty 0.4

## 2016-09-21 MED ORDER — LIDOCAINE HCL 3.5 % OP GEL
1.0000 "application " | Freq: Once | OPHTHALMIC | Status: AC
  Administered 2016-09-21: 1 via OPHTHALMIC

## 2016-09-21 MED ORDER — MOXIFLOXACIN HCL 0.5 % OP SOLN
OPHTHALMIC | Status: DC | PRN
  Administered 2016-09-21: 1 [drp] via OPHTHALMIC

## 2016-09-21 MED ORDER — POVIDONE-IODINE 5 % OP SOLN
1.0000 "application " | Freq: Once | OPHTHALMIC | Status: AC
  Administered 2016-09-21: 1 via OPHTHALMIC

## 2016-09-21 MED ORDER — NA CHONDROIT SULF-NA HYALURON 40-17 MG/ML IO SOLN
INTRAOCULAR | Status: DC | PRN
  Administered 2016-09-21: 1 mL via INTRAOCULAR

## 2016-09-21 MED ORDER — LACTATED RINGERS IV SOLN: INTRAVENOUS | Status: DC

## 2016-09-21 MED ORDER — MOXIFLOXACIN HCL 0.5 % OP SOLN
OPHTHALMIC | Status: AC
  Administered 2016-09-21: 09:00:00
  Filled 2016-09-21: qty 3

## 2016-09-21 MED ORDER — POVIDONE-IODINE 5 % OP SOLN
OPHTHALMIC | Status: AC
  Filled 2016-09-21: qty 30

## 2016-09-21 MED ORDER — LIDOCAINE HCL 3.5 % OP GEL
OPHTHALMIC | Status: AC
  Administered 2016-09-21: 1 via OPHTHALMIC
  Filled 2016-09-21: qty 1

## 2016-09-21 MED ORDER — CARBACHOL 0.01 % IO SOLN
INTRAOCULAR | Status: DC | PRN
  Administered 2016-09-21: 0.5 mL via INTRAOCULAR

## 2016-09-21 MED ORDER — EPINEPHRINE HCL 1 MG/ML IJ SOLN
INTRAOCULAR | Status: DC | PRN
  Administered 2016-09-21: 1 mL via OPHTHALMIC

## 2016-09-21 MED ORDER — EPINEPHRINE HCL 1 MG/ML IJ SOLN
INTRAMUSCULAR | Status: AC
  Filled 2016-09-21: qty 1

## 2016-09-21 SURGICAL SUPPLY — 22 items
CANNULA ANT/CHMB 27G (MISCELLANEOUS) ×1 IMPLANT
CANNULA ANT/CHMB 27GA (MISCELLANEOUS) ×2 IMPLANT
CUP MEDICINE 2OZ PLAST GRAD ST (MISCELLANEOUS) ×2 IMPLANT
GLOVE BIO SURGEON STRL SZ8 (GLOVE) ×2 IMPLANT
GLOVE BIOGEL M 6.5 STRL (GLOVE) ×2 IMPLANT
GLOVE SURG LX 8.0 MICRO (GLOVE) ×1
GLOVE SURG LX STRL 8.0 MICRO (GLOVE) ×1 IMPLANT
GOWN STRL REUS W/ TWL LRG LVL3 (GOWN DISPOSABLE) ×2 IMPLANT
GOWN STRL REUS W/TWL LRG LVL3 (GOWN DISPOSABLE) ×4
LENS IOL TECNIS ITEC 22.5 (Intraocular Lens) ×1 IMPLANT
PACK CATARACT (MISCELLANEOUS) ×2 IMPLANT
PACK CATARACT BRASINGTON LX (MISCELLANEOUS) ×2 IMPLANT
PACK EYE AFTER SURG (MISCELLANEOUS) ×2 IMPLANT
SOL BSS BAG (MISCELLANEOUS) ×2
SOL PREP PVP 2OZ (MISCELLANEOUS) ×2
SOLUTION BSS BAG (MISCELLANEOUS) ×1 IMPLANT
SOLUTION PREP PVP 2OZ (MISCELLANEOUS) ×1 IMPLANT
SYR 3ML LL SCALE MARK (SYRINGE) ×2 IMPLANT
SYR 5ML LL (SYRINGE) ×2 IMPLANT
SYR TB 1ML 27GX1/2 LL (SYRINGE) ×2 IMPLANT
WATER STERILE IRR 250ML POUR (IV SOLUTION) ×2 IMPLANT
WIPE NON LINTING 3.25X3.25 (MISCELLANEOUS) ×2 IMPLANT

## 2016-09-21 NOTE — Discharge Instructions (Signed)
Eye Surgery Discharge Instructions  Expect mild scratchy sensation or mild soreness. DO NOT RUB YOUR EYE!  The day of surgery:  Minimal physical activity, but bed rest is not required  No reading, computer work, or close hand work  No bending, lifting, or straining.  May watch TV  For 24 hours:  No driving, legal decisions, or alcoholic beverages  Safety precautions  Eat anything you prefer: It is better to start with liquids, then soup then solid foods.  _____ Eye patch should be worn until postoperative exam tomorrow.  ____ Solar shield eyeglasses should be worn for comfort in the sunlight/patch while sleeping  Resume all regular medications including aspirin or Coumadin if these were discontinued prior to surgery. You may shower, bathe, shave, or wash your hair. Tylenol may be taken for mild discomfort.  Call your doctor if you experience significant pain, nausea, or vomiting, fever > 101 or other signs of infection. [phone] or [phone] Specific instructions:  Follow-up Information    Tim Lair, MD .   Specialty:  Ophthalmology Why:  October 4 at 9:05am Contact information: [address] [place] [address] [phone]

## 2016-09-21 NOTE — Transfer of Care (Signed)
Immediate Anesthesia Transfer of Care Note  [name]  Procedure(s) Performed: Procedure(s) with comments: CATARACT EXTRACTION PHACO AND INTRAOCULAR LENS PLACEMENT (IOC) (Right) - [place] 44.1 AP% 16.5 CDE 7.30 Fluid Pack Lot [id number]  Patient Location: PACU  Anesthesia Type:MAC  Level of Consciousness: awake, alert  and oriented  Airway & Oxygen Therapy: Patient Spontanous Breathing  Post-op Assessment: Report given to RN and Post -op Vital signs reviewed and stable  Post vital signs: Reviewed and stable  Last Vitals:  Vitals:   09/21/16 0833  BP: 132/60  Pulse: (!) 59  Resp: 16  Temp: 37.2 C    Last Pain:  Vitals:   09/21/16 0833  TempSrc: Tympanic         Complications: No apparent anesthesia complications

## 2016-09-21 NOTE — Anesthesia Postprocedure Evaluation (Signed)
Anesthesia Post Note  [name]  Procedure(s) Performed: Procedure(s) (LRB): CATARACT EXTRACTION PHACO AND INTRAOCULAR LENS PLACEMENT (IOC) (Right)  Patient location during evaluation: PACU Anesthesia Type: MAC Level of consciousness: oriented and awake and alert Pain management: satisfactory to patient Vital Signs Assessment: post-procedure vital signs reviewed and stable Respiratory status: respiratory function stable Cardiovascular status: stable Anesthetic complications: no    Last Vitals:  Vitals:   09/21/16 0833  BP: 132/60  Pulse: (!) 59  Resp: 16  Temp: 37.2 C    Last Pain:  Vitals:   09/21/16 0833  TempSrc: Tympanic                 Blima Singer

## 2016-09-21 NOTE — Anesthesia Preprocedure Evaluation (Signed)
Anesthesia Evaluation  Patient identified by MRN, date of birth, ID band Patient awake    Reviewed: Allergy & Precautions, H&P , NPO status , Patient's Chart, lab work & pertinent test results  History of Anesthesia Complications (+) PONV and history of anesthetic complications  Airway Mallampati: III  TM Distance: <3 FB Neck ROM: limited    Dental no notable dental hx. (+) Poor Dentition, Chipped   Pulmonary neg pulmonary ROS,    Pulmonary exam normal breath sounds clear to auscultation       Cardiovascular Exercise Tolerance: Good hypertension, (-) angina(-) Past MI and (-) DOE Normal cardiovascular exam+ dysrhythmias Atrial Fibrillation  Rhythm:regular Rate:Normal     Neuro/Psych  Headaches, PSYCHIATRIC DISORDERS  Neuromuscular disease    GI/Hepatic Neg liver ROS, hiatal hernia, GERD  Controlled,  Endo/Other  negative endocrine ROS  Renal/GU Renal disease     Musculoskeletal  (+) Arthritis ,   Abdominal   Peds  Hematology negative hematology ROS (+)   Anesthesia Other Findings Past Medical History: No date: Atrial fibrillation (HCC)     Comment: Paroxysmal, hx of 02/2011: Atrial flutter (HCC)     Comment: s/p cardioversion  No date: Cardiomyopathy No date: Chronic kidney disease     Comment: acute renal failure secondary to dehydration               which is now resolved No date: Cystocele No date: Dyspnea No date: Dysrhythmia No date: GERD (gastroesophageal reflux disease) No date: Headache(784.0)     Comment: chronic No date: Hyperlipidemia No date: Hypertension No date: Knee fracture No date: Light headedness     Comment: due to dehydration No date: PONV (postoperative nausea and vomiting)     Comment: oxycodone and codiene cause N/V  No date: Pre-syncope  Past Surgical History: 1990: ABDOMINAL HYSTERECTOMY No date: APPENDECTOMY 1986: AUGMENTATION MAMMAPLASTY Bilateral     Comment:  implants 1990: AUGMENTATION MAMMAPLASTY 2011: AUGMENTATION MAMMAPLASTY No date: CARDIAC CATHETERIZATION No date: CARDIOVERSION     Comment: x 3 No date: CARDIOVERSION No date: CHOLECYSTECTOMY No date: COMBINED AUGMENTATION MAMMAPLASTY AND ABDOMINO* 06/04/2013: JOINT REPLACEMENT Left     Comment: left knee 1986: MASTECTOMY     Comment: Bilateral with silicone  breast implants, s/p               saline replacements No date: Multiple orthopedic procedures No date: TOTAL KNEE ARTHROPLASTY Left     Reproductive/Obstetrics negative OB ROS                             Anesthesia Physical  Anesthesia Plan  ASA: III  Anesthesia Plan: MAC   Post-op Pain Management:    Induction:   Airway Management Planned:   Additional Equipment:   Intra-op Plan:   Post-operative Plan:   Informed Consent: I have reviewed the patients History and Physical, chart, labs and discussed the procedure including the risks, benefits and alternatives for the proposed anesthesia with the patient or authorized representative who has indicated his/her understanding and acceptance.     Plan Discussed with: Anesthesiologist, CRNA and Surgeon  Anesthesia Plan Comments:         Anesthesia Quick Evaluation

## 2016-09-21 NOTE — H&P (Signed)
All labs reviewed. Abnormal studies sent to patients PCP when indicated.  Previous H&P reviewed, patient examined, there are NO CHANGES.  [name] [name] LOUIS10/3/20179:39 AM

## 2016-09-21 NOTE — Op Note (Signed)
PREOPERATIVE DIAGNOSIS:  Nuclear sclerotic cataract of the right eye.   POSTOPERATIVE DIAGNOSIS:  NUCLEAR SCLEROTIC CATARACT RIGHT EYE   OPERATIVE PROCEDURE: Procedure(s): CATARACT EXTRACTION PHACO AND INTRAOCULAR LENS PLACEMENT (IOC)   SURGEON:  Birder Robson, MD.   ANESTHESIA:  Anesthesiologist: Martha Clan, MD CRNA: Demetrius Charity, CRNA  1.      Managed anesthesia care. 2.      Topical tetracaine drops followed by 2% Xylocaine jelly applied in the preoperative holding area.   COMPLICATIONS:  None.   TECHNIQUE:   Stop and chop   DESCRIPTION OF PROCEDURE:  The patient was examined and consented in the preoperative holding area where the aforementioned topical anesthesia was applied to the right eye and then brought back to the Operating Room where the right eye was prepped and draped in the usual sterile ophthalmic fashion and a lid speculum was placed. A paracentesis was created with the side port blade and the anterior chamber was filled with viscoelastic. A near clear corneal incision was performed with the steel keratome. A continuous curvilinear capsulorrhexis was performed with a cystotome followed by the capsulorrhexis forceps. Hydrodissection and hydrodelineation were carried out with BSS on a blunt cannula. The lens was removed in a stop and chop  technique and the remaining cortical material was removed with the irrigation-aspiration handpiece. The capsular bag was inflated with viscoelastic and the Technis [id number]  lens was placed in the capsular bag without complication. The remaining viscoelastic was removed from the eye with the irrigation-aspiration handpiece. The wounds were hydrated. The anterior chamber was flushed with Miostat and the eye was inflated to physiologic pressure. 0.2 mL of Vigamox diluted three/one with BSS was placed in the anterior chamber. The wounds were found to be water tight. The eye was dressed with Vigamox. The patient was given protective glasses to  wear throughout the day and a shield with which to sleep tonight. The patient was also given drops with which to begin a drop regimen today and will follow-up with me in one day.  Implant Name Type Inv. Item Serial No. Manufacturer Lot No. LRB No. Used  LENS IOL DIOP 22.5 - [id number] Intraocular Lens LENS IOL DIOP 22.5 [id number] AMO   Right 1   Procedure(s) with comments: CATARACT EXTRACTION PHACO AND INTRAOCULAR LENS PLACEMENT (IOC) (Right) - [place] 44.1 AP% 16.5 CDE 7.30 Fluid Pack Lot [id number]  Electronically signed: Lanora Reveron LOUIS 09/21/2016 10:10 AM

## 2016-09-21 NOTE — Anesthesia Procedure Notes (Signed)
Procedure Name: MAC Performed by: Hildred Mollica Pre-anesthesia Checklist: Patient identified, Emergency Drugs available, Suction available, Patient being monitored and Timeout performed Oxygen Delivery Method: Nasal cannula       

## 2016-09-30 ENCOUNTER — Ambulatory Visit (INDEPENDENT_AMBULATORY_CARE_PROVIDER_SITE_OTHER): Payer: Medicare Other | Admitting: Cardiovascular Disease

## 2016-09-30 ENCOUNTER — Encounter: Payer: Self-pay | Admitting: Cardiovascular Disease

## 2016-09-30 VITALS — BP 122/60 | HR 56 | Ht 63.0 in | Wt 155.8 lb

## 2016-09-30 DIAGNOSIS — I481 Persistent atrial fibrillation: Secondary | ICD-10-CM | POA: Diagnosis not present

## 2016-09-30 DIAGNOSIS — I10 Essential (primary) hypertension: Secondary | ICD-10-CM | POA: Diagnosis not present

## 2016-09-30 DIAGNOSIS — I4819 Other persistent atrial fibrillation: Secondary | ICD-10-CM

## 2016-09-30 NOTE — Patient Instructions (Signed)
Medication Instructions: Continue same medications.   Labwork: None.   Procedures/Testing: None.   Follow-Up: 6 months with Dr. Arida.   Any Additional Special Instructions Will Be Listed Below (If Applicable).     If you need a refill on your cardiac medications before your next appointment, please call your pharmacy.   

## 2016-09-30 NOTE — Progress Notes (Signed)
Cardiology Office Note   Date:  09/30/2016   ID:  [name], DOB 1936-12-01, MRN [id number]  PCP:  [name] Mc, MD  Cardiologist:   [name], MD   Chief Complaint  Patient presents with  . other    6 month follow up. Meds reviewed by the pt. verbally.       History of Present Illness: [name] [name] is a 80 y.o. female who presents for a followup visit regarding persistent atrial fibrillation.   She has known history of persistent atrial fibrillation/flutter status post multiple cardioversions in the past. 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 She had cardiac catheterization twice in the past without significant coronary artery disease.  Most recent cardioversion was in August of 2014. She did have bradycardia post cardioversion. She has known history of labile hypertension. Most recent Echocardiogram in November 2016 showed normal LV systolic function with mildly dilated left atrium, mild mitral regurgitation, and mild to moderate pulmonary hypertension.  She had intermittent Mobitz 2 AV block which resolved after decreasing the dose of amiodarone to 100 mg once daily.  She is on long-term anticoagulation with Eliquis. She had recent blunt head trauma and went to the emergency room. CT scan was negative for bleeding. She had worsening leg edema and was prescribed small dose furosemide by Dr.Tullo. She has been doing well Otherwise with no chest pain, shortness of breath, palpitations or dizziness.    Past Medical History:  Diagnosis Date  . Arthritis   . Atrial fibrillation (HCC)    Paroxysmal, hx of  . Atrial flutter ([place]) 02/2011   s/p cardioversion   . Cancer (HCC)    BREAST  . Cardiomyopathy   . Chronic kidney disease    acute renal failure secondary to dehydration which is now resolved  . Cystocele   . Dyspnea   . Dysrhythmia   . Edema    LEGS/FEET  . GERD (gastroesophageal reflux disease)   .  Headache(784.0)    chronic  . Hyperlipidemia   . Hypertension   . Knee fracture   . Light headedness    due to dehydration  . Palpitations   . PONV (postoperative nausea and vomiting)    oxycodone and codiene cause N/V   . Pre-syncope   . Vertigo     Past Surgical History:  Procedure Laterality Date  . ABDOMINAL HYSTERECTOMY  1990  . APPENDECTOMY    . AUGMENTATION MAMMAPLASTY Bilateral 1986   implants  . AUGMENTATION MAMMAPLASTY  1990  . AUGMENTATION MAMMAPLASTY  2011  . CARDIAC CATHETERIZATION    . CARDIOVERSION     x 3  . CARDIOVERSION    . CATARACT EXTRACTION W/PHACO Right 09/21/2016   Procedure: CATARACT EXTRACTION PHACO AND INTRAOCULAR LENS PLACEMENT (IOC);  Surgeon: Birder Robson, MD;  Location: [facility] ORS;  Service: Ophthalmology;  Laterality: Right;  [place] 44.1 AP% 16.5 CDE 7.30 Fluid Pack Lot [id number]  . CHOLECYSTECTOMY    . COMBINED AUGMENTATION MAMMAPLASTY AND ABDOMINOPLASTY    . JOINT REPLACEMENT Left 06/04/2013   left knee  . KNEE ARTHROSCOPY Right 08/16/2016   Procedure: ARTHROSCOPY KNEE, tear posterior horn medial meniscus, tear anterior and posterior horns of lateral meniscus, chondromalacia of lateral compartment grade 3 patella and grade 4 medial;  Surgeon: Dereck Leep, MD;  Location: [facility] ORS;  Service: Orthopedics;  Laterality: Right;  . MASTECTOMY  1986   Bilateral with silicone  breast implants, s/p saline replacements  . Multiple  orthopedic procedures    . NOSE SURGERY    . TOTAL KNEE ARTHROPLASTY Left      Current Outpatient Prescriptions  Medication Sig Dispense Refill  . acetaminophen (TYLENOL) 500 MG tablet Take 1,000 mg by mouth every 6 (six) hours as needed.    [name] aluminum-magnesium hydroxide 200-200 MG/5ML suspension Take 15 mLs by mouth every 6 (six) hours as needed for indigestion.    [name] amiodarone (PACERONE) 200 MG tablet TAKE 1/2 TABLET BY MOUTH EVERY DAY 15 tablet 6  . amLODipine (NORVASC) 2.5 MG tablet Take 2 tablets (5 mg total) by  mouth daily. (Patient taking differently: Take 5 mg by mouth daily. 2.5 in am and 5mg  in the evening) 180 tablet 3  . apixaban (ELIQUIS) 5 MG TABS tablet Take 1 tablet (5 mg total) by mouth 2 (two) times daily. 60 tablet 6  . carboxymethylcellulose (REFRESH PLUS) 0.5 % SOLN Place 1 drop into both eyes daily as needed.    . cetirizine (ZYRTEC) 10 MG tablet Take 10 mg by mouth daily as needed for allergies.    . Cholecalciferol (VITAMIN D3) 1000 UNITS CAPS Take 1 capsule by mouth every morning.     . diazepam (VALIUM) 5 MG tablet Take 5 mg by mouth at bedtime as needed for anxiety.    . fluticasone (FLONASE) 50 MCG/ACT nasal spray Place 2 sprays into both nostrils daily. (Patient taking differently: Place 2 sprays into both nostrils daily as needed for rhinitis. ) 16 g 0  . furosemide (LASIX) 20 MG tablet Take 1 tablet (20 mg total) by mouth daily. As needed for edema not more than every other day 45 tablet 1  . HYDROcodone-acetaminophen (NORCO) 5-325 MG tablet Take 1-2 tablets by mouth every 4 (four) hours as needed for moderate pain. 40 tablet 0  . omeprazole (PRILOSEC) 40 MG capsule TAKE ONE CAPSULE BY MOUTH DAILY 30 capsule 5  . pravastatin (PRAVACHOL) 20 MG tablet TAKE ONE (1) TABLET BY MOUTH EVERY DAY (Patient taking differently: TAKE ONE (1) TABLET BY MOUTH in the evening) 90 tablet 0  . sodium chloride (OCEAN) 0.65 % SOLN nasal spray Place 1 spray into both nostrils as needed for congestion.     No current facility-administered medications for this visit.     Allergies:   Iodine; Biaxin [clarithromycin]; Codeine; Enalapril; Fluocinonide; Oxycodone; Promethazine; Warfarin sodium; and Xarelto [rivaroxaban]    Social History:  The patient  reports that she has never smoked. She has never used smokeless tobacco. She reports that she does not drink alcohol or use drugs.   Family History:  The patient's family history includes Breast cancer in her maternal grandmother, sister, sister, and  sister; Cancer in her father, maternal grandmother, sister, and sister; Heart disease in her mother and son.    ROS:  Please see the history of present illness.   Otherwise, review of systems are positive for none.   All other systems are reviewed and negative.    PHYSICAL EXAM: VS:  Ht 5\' 3"  (1.6 m)   Wt 155 lb 12 oz (70.6 kg)   BMI 27.59 kg/m  , BMI Body mass index is 27.59 kg/m. GEN: Well nourished, well developed, in no acute distress  HEENT: normal  Neck: no JVD, carotid bruits, or masses Cardiac: RRR; no murmurs, rubs, or gallops,no edema  Respiratory:  clear to auscultation bilaterally, normal work of breathing GI: soft, nontender, nondistended, + BS MS: no deformity or atrophy  Skin: warm and dry, no rash Neuro:  Strength and sensation are intact Psych: euthymic mood, full affect   EKG:  EKG is ordered today. The ekg ordered today demonstrates normal sinus rhythm with right bundle branch block and nonspecific T wave changes.   Recent Labs: 03/29/2016: Hemoglobin 13.2 04/22/2016: Platelets 241 06/28/2016: ALT 14; BUN 25; Creatinine, Ser 1.29; Potassium 4.4; Sodium 140    Lipid Panel    Component Value Date/Time   CHOL 174 04/22/2015 1044   TRIG 82.0 04/22/2015 1044   HDL 61.00 04/22/2015 1044   CHOLHDL 3 04/22/2015 1044   VLDL 16.4 04/22/2015 1044   LDLCALC 97 04/22/2015 1044      Wt Readings from Last 3 Encounters:  09/30/16 155 lb 12 oz (70.6 kg)  09/21/16 158 lb (71.7 kg)  08/31/16 157 lb 8 oz (71.4 kg)       ASSESSMENT AND PLAN:  1.  Persistent atrial fibrillation: Currently maintaining in sinus rhythm with small dose amiodarone. She is on anticoagulation with Eliquis.  No bleeding complications.  2. Essential hypertension: Blood pressure is well controlled on current medications.  3. History of tachycardia-induced cardiomyopathy. Most recent ejection fraction was normal .  4. Chronic venous insufficiency mostly affecting the right leg: I agree  with support stockings and leg elevation. She has used few doses of furosemide with subsequent improvement.   Disposition:   FU with me in 6 months  Signed,  [name], MD  09/30/2016 11:41 AM    [place]

## 2016-10-04 DIAGNOSIS — I129 Hypertensive chronic kidney disease with stage 1 through stage 4 chronic kidney disease, or unspecified chronic kidney disease: Secondary | ICD-10-CM | POA: Diagnosis not present

## 2016-10-04 DIAGNOSIS — N2581 Secondary hyperparathyroidism of renal origin: Secondary | ICD-10-CM | POA: Diagnosis not present

## 2016-10-04 DIAGNOSIS — N183 Chronic kidney disease, stage 3 (moderate): Secondary | ICD-10-CM | POA: Diagnosis not present

## 2016-10-07 DIAGNOSIS — H60332 Swimmer's ear, left ear: Secondary | ICD-10-CM | POA: Diagnosis not present

## 2016-10-08 ENCOUNTER — Ambulatory Visit: Payer: Medicare Other | Admitting: Cardiovascular Disease

## 2016-10-12 DIAGNOSIS — H2512 Age-related nuclear cataract, left eye: Secondary | ICD-10-CM | POA: Diagnosis not present

## 2016-10-13 ENCOUNTER — Encounter: Payer: Self-pay | Admitting: *Deleted

## 2016-10-13 ENCOUNTER — Telehealth: Payer: Self-pay | Admitting: Internal Medicine

## 2016-10-13 MED ORDER — OMEPRAZOLE 20 MG PO CPDR: 20.0000 mg | DELAYED_RELEASE_CAPSULE | Freq: Two times a day (BID) | ORAL | 3 refills | Status: DC

## 2016-10-13 NOTE — Telephone Encounter (Signed)
OK to change to BID with meals patient request?

## 2016-10-13 NOTE — Telephone Encounter (Signed)
Dose changed to 20 mg twice daily

## 2016-10-13 NOTE — Telephone Encounter (Signed)
Pt would like to have her omeprazole (PRILOSEC) 40 MG capsule refilled. She is requesting that it is changed to twice a day before meals.(20 mg per pill to equal 40 mg a day).

## 2016-10-14 ENCOUNTER — Other Ambulatory Visit: Payer: Self-pay

## 2016-10-14 ENCOUNTER — Other Ambulatory Visit: Payer: Self-pay | Admitting: Internal Medicine

## 2016-10-19 ENCOUNTER — Encounter
Admission: RE | Disposition: A | Discharge status: Home / Self Care | Payer: Self-pay | Source: Ambulatory Visit | Attending: Ophthalmology

## 2016-10-19 ENCOUNTER — Ambulatory Visit: Payer: Medicare Other | Admitting: Anesthesiology

## 2016-10-19 ENCOUNTER — Encounter: Payer: Self-pay | Admitting: *Deleted

## 2016-10-19 ENCOUNTER — Ambulatory Visit
Admission: RE | Admit: 2016-10-19 | Discharge: 2016-10-19 | Disposition: A | Discharge status: Home / Self Care | Payer: Medicare Other | Source: Ambulatory Visit | Attending: Ophthalmology | Admitting: Ophthalmology

## 2016-10-19 DIAGNOSIS — G709 Myoneural disorder, unspecified: Secondary | ICD-10-CM | POA: Diagnosis not present

## 2016-10-19 DIAGNOSIS — N189 Chronic kidney disease, unspecified: Secondary | ICD-10-CM | POA: Diagnosis not present

## 2016-10-19 DIAGNOSIS — K449 Diaphragmatic hernia without obstruction or gangrene: Secondary | ICD-10-CM | POA: Insufficient documentation

## 2016-10-19 DIAGNOSIS — Z79899 Other long term (current) drug therapy: Secondary | ICD-10-CM | POA: Insufficient documentation

## 2016-10-19 DIAGNOSIS — I739 Peripheral vascular disease, unspecified: Secondary | ICD-10-CM | POA: Insufficient documentation

## 2016-10-19 DIAGNOSIS — H2512 Age-related nuclear cataract, left eye: Secondary | ICD-10-CM | POA: Diagnosis not present

## 2016-10-19 DIAGNOSIS — N289 Disorder of kidney and ureter, unspecified: Secondary | ICD-10-CM | POA: Insufficient documentation

## 2016-10-19 DIAGNOSIS — I129 Hypertensive chronic kidney disease with stage 1 through stage 4 chronic kidney disease, or unspecified chronic kidney disease: Secondary | ICD-10-CM | POA: Diagnosis not present

## 2016-10-19 DIAGNOSIS — E1136 Type 2 diabetes mellitus with diabetic cataract: Secondary | ICD-10-CM | POA: Insufficient documentation

## 2016-10-19 DIAGNOSIS — I10 Essential (primary) hypertension: Secondary | ICD-10-CM | POA: Insufficient documentation

## 2016-10-19 DIAGNOSIS — K219 Gastro-esophageal reflux disease without esophagitis: Secondary | ICD-10-CM | POA: Diagnosis not present

## 2016-10-19 HISTORY — PX: CATARACT EXTRACTION W/PHACO: SHX586

## 2016-10-19 SURGERY — PHACOEMULSIFICATION, CATARACT, WITH IOL INSERTION
Anesthesia: Monitor Anesthesia Care | Site: Eye | Laterality: Left | Wound class: Clean

## 2016-10-19 MED ORDER — NA CHONDROIT SULF-NA HYALURON 40-17 MG/ML IO SOLN
INTRAOCULAR | Status: DC | PRN
  Administered 2016-10-19: 1 mL via INTRAOCULAR

## 2016-10-19 MED ORDER — POVIDONE-IODINE 5 % OP SOLN
OPHTHALMIC | Status: AC
  Filled 2016-10-19: qty 30

## 2016-10-19 MED ORDER — MIDAZOLAM HCL 2 MG/2ML IJ SOLN
INTRAMUSCULAR | Status: DC | PRN
  Administered 2016-10-19: 1 mg via INTRAVENOUS

## 2016-10-19 MED ORDER — LIDOCAINE HCL (PF) 4 % IJ SOLN
INTRAMUSCULAR | Status: AC
Start: 2016-10-19 — End: 2016-10-19
  Filled 2016-10-19: qty 5

## 2016-10-19 MED ORDER — EPINEPHRINE PF 1 MG/ML IJ SOLN
INTRAMUSCULAR | Status: DC | PRN
  Administered 2016-10-19: 1 mL via OPHTHALMIC

## 2016-10-19 MED ORDER — ARMC OPHTHALMIC DILATING DROPS
1.0000 "application " | OPHTHALMIC | Status: AC
  Administered 2016-10-19 (×3): 1 via OPHTHALMIC

## 2016-10-19 MED ORDER — EPINEPHRINE PF 1 MG/ML IJ SOLN
INTRAMUSCULAR | Status: AC
  Filled 2016-10-19: qty 2

## 2016-10-19 MED ORDER — POVIDONE-IODINE 5 % OP SOLN
OPHTHALMIC | Status: DC | PRN
  Administered 2016-10-19: 1 via OPHTHALMIC

## 2016-10-19 MED ORDER — SODIUM CHLORIDE 0.9 % IV SOLN
INTRAVENOUS | Status: DC
  Administered 2016-10-19: 12:00:00 via INTRAVENOUS

## 2016-10-19 MED ORDER — ARMC OPHTHALMIC DILATING DROPS: 1.0000 "application " | OPHTHALMIC | Status: DC

## 2016-10-19 MED ORDER — MOXIFLOXACIN HCL 0.5 % OP SOLN
OPHTHALMIC | Status: AC
  Filled 2016-10-19: qty 3

## 2016-10-19 MED ORDER — MOXIFLOXACIN HCL 0.5 % OP SOLN
1.0000 [drp] | OPHTHALMIC | Status: AC
  Administered 2016-10-19 (×3): 1 [drp] via OPHTHALMIC

## 2016-10-19 MED ORDER — ARMC OPHTHALMIC DILATING DROPS
OPHTHALMIC | Status: AC
  Filled 2016-10-19: qty 0.4

## 2016-10-19 MED ORDER — NA CHONDROIT SULF-NA HYALURON 40-17 MG/ML IO SOLN
INTRAOCULAR | Status: AC
  Filled 2016-10-19: qty 1

## 2016-10-19 MED ORDER — CARBACHOL 0.01 % IO SOLN
INTRAOCULAR | Status: DC | PRN
  Administered 2016-10-19: 0.5 mL via INTRAOCULAR

## 2016-10-19 MED ORDER — BSS IO SOLN
INTRAOCULAR | Status: DC | PRN
  Administered 2016-10-19: 4 mL via OPHTHALMIC

## 2016-10-19 SURGICAL SUPPLY — 22 items
CANNULA ANT/CHMB 27G (MISCELLANEOUS) ×1 IMPLANT
CANNULA ANT/CHMB 27GA (MISCELLANEOUS) ×2 IMPLANT
CUP MEDICINE 2OZ PLAST GRAD ST (MISCELLANEOUS) ×2 IMPLANT
GLOVE BIO SURGEON STRL SZ8 (GLOVE) ×2 IMPLANT
GLOVE BIOGEL M 6.5 STRL (GLOVE) ×2 IMPLANT
GLOVE SURG LX 8.0 MICRO (GLOVE) ×1
GLOVE SURG LX STRL 8.0 MICRO (GLOVE) ×1 IMPLANT
GOWN STRL REUS W/ TWL LRG LVL3 (GOWN DISPOSABLE) ×2 IMPLANT
GOWN STRL REUS W/TWL LRG LVL3 (GOWN DISPOSABLE) ×4
LENS IOL TECNIS ITEC 22.0 (Intraocular Lens) ×1 IMPLANT
PACK CATARACT (MISCELLANEOUS) ×2 IMPLANT
PACK CATARACT BRASINGTON LX (MISCELLANEOUS) ×2 IMPLANT
PACK EYE AFTER SURG (MISCELLANEOUS) ×2 IMPLANT
SOL BSS BAG (MISCELLANEOUS) ×2
SOL PREP PVP 2OZ (MISCELLANEOUS) ×2
SOLUTION BSS BAG (MISCELLANEOUS) ×1 IMPLANT
SOLUTION PREP PVP 2OZ (MISCELLANEOUS) ×1 IMPLANT
SYR 3ML LL SCALE MARK (SYRINGE) ×2 IMPLANT
SYR 5ML LL (SYRINGE) ×2 IMPLANT
SYR TB 1ML 27GX1/2 LL (SYRINGE) ×2 IMPLANT
WATER STERILE IRR 250ML POUR (IV SOLUTION) ×2 IMPLANT
WIPE NON LINTING 3.25X3.25 (MISCELLANEOUS) ×2 IMPLANT

## 2016-10-19 NOTE — Discharge Instructions (Signed)

## 2016-10-19 NOTE — Transfer of Care (Signed)
Immediate Anesthesia Transfer of Care Note  [name]  Procedure(s) Performed: Procedure(s) with comments: CATARACT EXTRACTION PHACO AND INTRAOCULAR LENS PLACEMENT (IOC) (Left) - [place] 53.7 AP% 19.5 CDE 10.45 Fluid pack lot # [id number]  Patient Location: PACU  Anesthesia Type:MAC  Level of Consciousness: awake, alert  and oriented  Airway & Oxygen Therapy: Patient Spontanous Breathing  Post-op Assessment: Report given to RN and Post -op Vital signs reviewed and stable  Post vital signs: Reviewed and stable  Last Vitals:  Vitals:   10/19/16 1134  BP: (!) 147/71  Pulse: 62  Resp: 16  Temp: 36.3 C    Last Pain:  Vitals:   10/19/16 1134  TempSrc: Tympanic         Complications: No apparent anesthesia complications

## 2016-10-19 NOTE — Anesthesia Procedure Notes (Signed)
Procedure Name: MAC Performed by: Wei Newbrough Pre-anesthesia Checklist: Patient identified, Emergency Drugs available, Suction available, Patient being monitored and Timeout performed Oxygen Delivery Method: Nasal cannula       

## 2016-10-19 NOTE — H&P (Signed)
All labs reviewed. Abnormal studies sent to patients PCP when indicated.  Previous H&P reviewed, patient examined, there are NO CHANGES.  [name] [name] LOUIS10/31/201712:28 PM

## 2016-10-19 NOTE — Anesthesia Postprocedure Evaluation (Signed)
Anesthesia Post Note  [name]  Procedure(s) Performed: Procedure(s) (LRB): CATARACT EXTRACTION PHACO AND INTRAOCULAR LENS PLACEMENT (IOC) (Left)  Patient location during evaluation: PACU Anesthesia Type: MAC Level of consciousness: oriented and awake and alert Pain management: satisfactory to patient Vital Signs Assessment: post-procedure vital signs reviewed and stable Respiratory status: respiratory function stable Cardiovascular status: stable Anesthetic complications: no    Last Vitals:  Vitals:   10/19/16 1134  BP: (!) 147/71  Pulse: 62  Resp: 16  Temp: 36.3 C    Last Pain:  Vitals:   10/19/16 1134  TempSrc: Tympanic                 Blima Singer

## 2016-10-19 NOTE — Op Note (Signed)
PREOPERATIVE DIAGNOSIS:  Nuclear sclerotic cataract of the left eye.   POSTOPERATIVE DIAGNOSIS:  Nuclear sclerotic cataract of the left eye.   OPERATIVE PROCEDURE: Procedure(s): CATARACT EXTRACTION PHACO AND INTRAOCULAR LENS PLACEMENT (IOC)   SURGEON:  Birder Robson, MD.   ANESTHESIA:  Anesthesiologist: Molli Barrows, MD CRNA: Demetrius Charity, CRNA  1.      Managed anesthesia care. 2.     0.3ml of Shugarcaine was instilled following the paracentesis   COMPLICATIONS:  None.   TECHNIQUE:   Stop and chop   DESCRIPTION OF PROCEDURE:  The patient was examined and consented in the preoperative holding area where the aforementioned topical anesthesia was applied to the left eye and then brought back to the Operating Room where the left eye was prepped and draped in the usual sterile ophthalmic fashion and a lid speculum was placed. A paracentesis was created with the side port blade and the anterior chamber was filled with viscoelastic. A near clear corneal incision was performed with the steel keratome. A continuous curvilinear capsulorrhexis was performed with a cystotome followed by the capsulorrhexis forceps. Hydrodissection and hydrodelineation were carried out with BSS on a blunt cannula. The lens was removed in a stop and chop  technique and the remaining cortical material was removed with the irrigation-aspiration handpiece. The capsular bag was inflated with viscoelastic and the Technis [id number] lens was placed in the capsular bag without complication. The remaining viscoelastic was removed from the eye with the irrigation-aspiration handpiece. The wounds were hydrated. The anterior chamber was flushed with Miostat and the eye was inflated to physiologic pressure. 0.42ml Vigamox was placed in the anterior chamber. The wounds were found to be water tight. The eye was dressed with Vigamox. The patient was given protective glasses to wear throughout the day and a shield with which to sleep  tonight. The patient was also given drops with which to begin a drop regimen today and will follow-up with me in one day.  Implant Name Type Inv. Item Serial No. Manufacturer Lot No. LRB No. Used  LENS IOL DIOP 22.0 - [id number] Intraocular Lens LENS IOL DIOP 22.0 [id number]   Left 1    Procedure(s) with comments: CATARACT EXTRACTION PHACO AND INTRAOCULAR LENS PLACEMENT (IOC) (Left) - [place] 53.7 AP% 19.5 CDE 10.45 Fluid pack lot # [id number]  Electronically signed: [place] 10/19/2016 12:54 PM

## 2016-10-19 NOTE — Anesthesia Preprocedure Evaluation (Signed)
Anesthesia Evaluation  Patient identified by MRN, date of birth, ID band Patient awake    Reviewed: Allergy & Precautions, H&P , NPO status , Patient's Chart, lab work & pertinent test results, reviewed documented beta blocker date and time   History of Anesthesia Complications (+) PONV and history of anesthetic complications  Airway Mallampati: II  TM Distance: >3 FB Neck ROM: full    Dental no notable dental hx. (+) Teeth Intact   Pulmonary neg pulmonary ROS, shortness of breath,    Pulmonary exam normal breath sounds clear to auscultation       Cardiovascular Exercise Tolerance: Good hypertension, + Peripheral Vascular Disease  negative cardio ROS  + dysrhythmias  Rhythm:regular Rate:Normal     Neuro/Psych  Headaches, PSYCHIATRIC DISORDERS  Neuromuscular disease negative neurological ROS  negative psych ROS   GI/Hepatic negative GI ROS, Neg liver ROS, hiatal hernia, GERD  Medicated,  Endo/Other  negative endocrine ROSdiabetes  Renal/GU Renal disease     Musculoskeletal   Abdominal   Peds  Hematology negative hematology ROS (+)   Anesthesia Other Findings   Reproductive/Obstetrics negative OB ROS                             Anesthesia Physical Anesthesia Plan  ASA: III  Anesthesia Plan: MAC   Post-op Pain Management:    Induction:   Airway Management Planned:   Additional Equipment:   Intra-op Plan:   Post-operative Plan:   Informed Consent: I have reviewed the patients History and Physical, chart, labs and discussed the procedure including the risks, benefits and alternatives for the proposed anesthesia with the patient or authorized representative who has indicated his/her understanding and acceptance.     Plan Discussed with: CRNA  Anesthesia Plan Comments:         Anesthesia Quick Evaluation

## 2016-11-01 ENCOUNTER — Encounter: Payer: Self-pay | Admitting: Internal Medicine

## 2016-11-01 ENCOUNTER — Ambulatory Visit (INDEPENDENT_AMBULATORY_CARE_PROVIDER_SITE_OTHER): Payer: Medicare Other | Admitting: Internal Medicine

## 2016-11-01 VITALS — BP 130/66 | HR 70 | Temp 97.9°F | Resp 15 | Wt 158.0 lb

## 2016-11-01 DIAGNOSIS — R300 Dysuria: Secondary | ICD-10-CM

## 2016-11-01 DIAGNOSIS — K219 Gastro-esophageal reflux disease without esophagitis: Secondary | ICD-10-CM | POA: Diagnosis not present

## 2016-11-01 DIAGNOSIS — N952 Postmenopausal atrophic vaginitis: Secondary | ICD-10-CM | POA: Diagnosis not present

## 2016-11-01 LAB — POCT URINALYSIS DIPSTICK
Bilirubin, UA: NEGATIVE
Blood, UA: NEGATIVE
Glucose, UA: NEGATIVE
Ketones, UA: NEGATIVE
Nitrite, UA: NEGATIVE
Protein, UA: NEGATIVE
Spec Grav, UA: 1.02
Urobilinogen, UA: 0.2
pH, UA: 6

## 2016-11-01 MED ORDER — RANITIDINE HCL 150 MG PO TABS: 150.0000 mg | ORAL_TABLET | Freq: Two times a day (BID) | ORAL | 2 refills | Status: DC

## 2016-11-01 NOTE — Progress Notes (Signed)
Pre visit review using our clinic review tool, if applicable. No additional management support is needed unless otherwise documented below in the visit note. 

## 2016-11-01 NOTE — Progress Notes (Signed)
Subjective:  Patient ID: [name] [name], female    DOB: 02-21-1936  Age: 80 y.o. MRN: [name]  CC: The primary encounter diagnosis was Dysuria. Diagnoses of Vaginal atrophy and Gastroesophageal reflux disease without esophagitis were also pertinent to this visit.  HPI [name] [name] presents for evaluation  of urinary frequency accompanied by itching and burning at the end  of urination  athat started several days ago,  Has been using a new hypoallergenic wipes  2 days ago,  No difference ,  No discharge, urine has been clear. Used to be on vaginal estrogen  For drynbess,  Has not used in it months because symptoms had  Improved .  2) Burping a lot. Off and on during the day.   Takes omeprazole .no recent dietary changes.  Not constipated,  No increased flatus. Makes her worry about her heart because she feels pressure in her esophagus when she has to burp.   Outpatient Medications Prior to Visit  Medication Sig Dispense Refill  . acetaminophen (TYLENOL) 500 MG tablet Take 1,000 mg by mouth every 6 (six) hours as needed (pain).     [name] aluminum-magnesium hydroxide 200-200 MG/5ML suspension Take 20 mLs by mouth every 6 (six) hours as needed for indigestion.     [name] amiodarone (PACERONE) 200 MG tablet TAKE 1/2 TABLET BY MOUTH EVERY DAY 15 tablet 6  . amLODipine (NORVASC) 2.5 MG tablet Take 2 tablets (5 mg total) by mouth daily. (Patient taking differently: Take 2.5-5 mg by mouth 2 (two) times daily. 2.5 in am and 5mg  in the evening) 180 tablet 3  . apixaban (ELIQUIS) 5 MG TABS tablet Take 1 tablet (5 mg total) by mouth 2 (two) times daily. 60 tablet 6  . cetirizine (ZYRTEC) 10 MG tablet Take 10 mg by mouth daily as needed for allergies.    . Cholecalciferol (VITAMIN D3) 1000 UNITS CAPS Take 1,000 Units by mouth daily.     . fluticasone (FLONASE) 50 MCG/ACT nasal spray Place 2 sprays into both nostrils daily. 16 g 0  . furosemide (LASIX) 20 MG tablet Take 1 tablet (20 mg total) by mouth daily. As  needed for edema not more than every other day (Patient taking differently: Take 20 mg by mouth every other day as needed. As needed for edema) 45 tablet 1  . HYDROcodone-acetaminophen (NORCO) 5-325 MG tablet Take 1-2 tablets by mouth every 4 (four) hours as needed for moderate pain. 40 tablet 0  . omeprazole (PRILOSEC) 20 MG capsule Take 1 capsule (20 mg total) by mouth 2 (two) times daily. 60 capsule 3  . OVER THE COUNTER MEDICATION Take 1 capsule by mouth daily. Probiotic    . pravastatin (PRAVACHOL) 20 MG tablet TAKE ONE (1) TABLET BY MOUTH EVERY DAY 90 tablet 0  . sodium chloride (OCEAN) 0.65 % SOLN nasal spray Place 1 spray into both nostrils 2 (two) times daily as needed for congestion.      No facility-administered medications prior to visit.     Review of Systems;  Patient denies headache, fevers, malaise, unintentional weight loss, skin rash, eye pain, sinus congestion and sinus pain, sore throat, dysphagia,  hemoptysis , cough, dyspnea, wheezing, chest pain, palpitations, orthopnea, edema, abdominal pain, nausea, melena, diarrhea, constipation, flank pain,  hematuria, urinary  Frequency, nocturia, numbness, tingling, seizures,  Focal weakness, Loss of consciousness,  Tremor, insomnia, depression, anxiety, and suicidal ideation.      Objective:  BP 130/66   Pulse 70   Temp 97.9  F (36.6 C) (Oral)   Resp 15   Wt 158 lb (71.7 kg)   SpO2 94%   BMI 27.12 kg/m   BP Readings from Last 3 Encounters:  11/01/16 130/66  10/19/16 132/60  09/30/16 122/60    Wt Readings from Last 3 Encounters:  11/01/16 158 lb (71.7 kg)  10/19/16 158 lb (71.7 kg)  09/30/16 155 lb 12 oz (70.6 kg)    General appearance: alert, cooperative and appears stated age , symmetric, no tenderness/mass/nodules Back: symmetric, no curvature. ROM normal. No CVA tenderness. Lungs: clear to auscultation bilaterally Heart: regular rate and rhythm, S1, S2 normal, no murmur, click, rub or gallop Abdomen: soft,  non-tender; bowel sounds normal; no masses,  no organomegaly GYN: atrophic vaginal changes without erythema or discharge ,  Lab Results  Component Value Date   HGBA1C 5.8 06/28/2016   HGBA1C 5.9 10/14/2015   HGBA1C 6.3 01/11/2015    Lab Results  Component Value Date   CREATININE 1.29 (H) 06/28/2016   CREATININE 1.08 (H) 04/22/2016   CREATININE 0.9 03/29/2016    Lab Results  Component Value Date   WBC 6.0 04/22/2016   HGB 13.2 03/29/2016   HCT 39.2 04/22/2016   PLT 241 04/22/2016   GLUCOSE 106 (H) 06/28/2016   CHOL 174 04/22/2015   TRIG 82.0 04/22/2015   HDL 61.00 04/22/2015   LDLCALC 97 04/22/2015   ALT 14 06/28/2016   AST 14 06/28/2016   NA 140 06/28/2016   K 4.4 06/28/2016   CL 106 06/28/2016   CREATININE 1.29 (H) 06/28/2016   BUN 25 (H) 06/28/2016   CO2 28 06/28/2016   TSH 2.03 04/22/2015   INR 1.6 11/05/2013   HGBA1C 5.8 06/28/2016   MICROALBUR 1.0 06/28/2016    No results found.  Assessment & Plan:   Problem List Items Addressed This Visit    Dysuria - Primary    Unable to provide specimen,  sent home with container.       Relevant Orders   POCT Urinalysis Dipstick (Completed)   Urine Culture   Urinalysis, Routine w reflex microscopic   Vaginal atrophy    Advised to resume  Premarin cream based on exam and history . Sterile urine container given as she was unable to provide urine sample inhouse      GERD (gastroesophageal reflux disease)    Addressing   Cc of burping with trial of Mylanta Gas.       Relevant Medications   ranitidine (ZANTAC) 150 MG tablet     A total of 25 minutes of face to face time was spent with patient more than half of which was spent in counselling about the above mentioned conditions  and coordination of care  I am having Ms. Crisco start on ranitidine. I am also having her maintain her Vitamin D3, fluticasone, amLODipine, apixaban, amiodarone, sodium chloride, cetirizine, acetaminophen, aluminum-magnesium hydroxide,  HYDROcodone-acetaminophen, furosemide, OVER THE COUNTER MEDICATION, omeprazole, and pravastatin.  Meds ordered this encounter  Medications  . ranitidine (ZANTAC) 150 MG tablet    Sig: Take 1 tablet (150 mg total) by mouth 2 (two) times daily.    Dispense:  60 tablet    Refill:  2    There are no discontinued medications.  Follow-up: No Follow-up on file.   [name], MD

## 2016-11-01 NOTE — Patient Instructions (Addendum)
Your vaginal symptoms do not appear to be due to infection based on your exam  However, Please bring back a urine sample using the sterile container I gave you to be sure,  Resume vaginal estrogen:  Nightly for 2 weeks,  Then twice weekly thereafter   For your burping:  You can try substituting zantac TWICE DAILY  Instead of omeprazole  If you develop burping,  Try Mylanta Gas .  Make sure you eat slowly,  Avoid carbonation.   You can try taking Beano before any meal containing beans or vegetables

## 2016-11-02 ENCOUNTER — Other Ambulatory Visit (INDEPENDENT_AMBULATORY_CARE_PROVIDER_SITE_OTHER): Payer: Medicare Other

## 2016-11-02 DIAGNOSIS — R300 Dysuria: Secondary | ICD-10-CM | POA: Diagnosis not present

## 2016-11-02 LAB — URINALYSIS, ROUTINE W REFLEX MICROSCOPIC
Bilirubin Urine: NEGATIVE
Hgb urine dipstick: NEGATIVE
Ketones, ur: NEGATIVE
Leukocytes, UA: NEGATIVE
Nitrite: NEGATIVE
RBC / HPF: NONE SEEN (ref 0–?)
Specific Gravity, Urine: 1.01 (ref 1.000–1.030)
Total Protein, Urine: NEGATIVE
Urine Glucose: NEGATIVE
Urobilinogen, UA: 0.2 (ref 0.0–1.0)
pH: 7.5 (ref 5.0–8.0)

## 2016-11-02 NOTE — Assessment & Plan Note (Signed)
Unable to provide specimen,  sent home with container.

## 2016-11-02 NOTE — Assessment & Plan Note (Signed)
Advised to resume  Premarin cream based on exam and history . Sterile urine container given as she was unable to provide urine sample inhouse

## 2016-11-02 NOTE — Assessment & Plan Note (Signed)
Addressing   Cc of burping with trial of Mylanta Gas.

## 2016-11-05 LAB — URINE CULTURE

## 2016-11-08 ENCOUNTER — Telehealth: Payer: Self-pay | Admitting: Cardiovascular Disease

## 2016-11-08 NOTE — Telephone Encounter (Signed)
Pt requested Eliquis 5mg  samples  Samples of this drug were given to the patient, quantity 4 boxes, Lot Number [id number] Exp: 02/2019

## 2016-11-19 DIAGNOSIS — Z961 Presence of intraocular lens: Secondary | ICD-10-CM | POA: Diagnosis not present

## 2016-11-25 ENCOUNTER — Telehealth: Payer: Self-pay | Admitting: *Deleted

## 2016-11-25 ENCOUNTER — Ambulatory Visit (INDEPENDENT_AMBULATORY_CARE_PROVIDER_SITE_OTHER): Payer: Medicare Other | Admitting: Family Medicine

## 2016-11-25 ENCOUNTER — Encounter: Payer: Self-pay | Admitting: Family Medicine

## 2016-11-25 VITALS — BP 118/60 | HR 58 | Temp 98.1°F | Wt 158.0 lb

## 2016-11-25 DIAGNOSIS — Z789 Other specified health status: Secondary | ICD-10-CM

## 2016-11-25 DIAGNOSIS — R21 Rash and other nonspecific skin eruption: Secondary | ICD-10-CM | POA: Diagnosis not present

## 2016-11-25 MED ORDER — VALACYCLOVIR HCL 1 G PO TABS: 1000.0000 mg | ORAL_TABLET | Freq: Two times a day (BID) | ORAL | 0 refills | Status: DC

## 2016-11-25 NOTE — Telephone Encounter (Signed)
Patient had a missed call from the office, she stated that it could have been in reference to her office visit from today  Pt contact [phone]

## 2016-11-25 NOTE — Assessment & Plan Note (Addendum)
Patient's rash most likely related to shingles versus contact dermatitis. Given history concern would be for shingles. Patient is unsure if she had chickenpox as a child though she was exposed her children multiple times with chickenpox and did not contract chickenpox. I think this would argue for her having had immunity to varicella. We will check a varicella antibody. We will proceed with treatment with Valtrex. Discussed potential side effects of this medication with the patient and she will contact us if she develops them. If varicella antibody negative would consider topical steroid for contact dermatitis. Discussed avoidance of pregnant women, people that have not had chickenpox, and immunocompromised patients. She will keep the area covered as best as possible.

## 2016-11-25 NOTE — Progress Notes (Signed)
  Tommi Rumps, MD Phone: [phone]  [name] [name] is a 80 y.o. female who presents today for same-day visit.  Patient notes onset of rash yesterday on the left upper lateral arm. Notes there is a burning sensation preceding the rash for several days and then a papulovesicular rash with an erythematous base came up on the lateral aspect of her upper arm. She has had no fevers. No change in soaps or detergents. No contacts with any allergens. She did work outside though had long sleeves on. She cannot remember if she had chickenpox as a child though she was exposed to her children who had chickenpox and she did not get chickenpox. She got shingles vaccine previously. She has no rash elsewhere on her body.  ROS see history of present illness  Objective  Physical Exam Vitals:   11/25/16 0945  BP: 118/60  Pulse: (!) 58  Temp: 98.1 F (36.7 C)    BP Readings from Last 3 Encounters:  11/25/16 118/60  11/01/16 130/66  10/19/16 132/60   Wt Readings from Last 3 Encounters:  11/25/16 158 lb (71.7 kg)  11/01/16 158 lb (71.7 kg)  10/19/16 158 lb (71.7 kg)    Physical Exam  Constitutional: No distress.  Cardiovascular: Normal rate, regular rhythm and normal heart sounds.   Pulmonary/Chest: Effort normal and breath sounds normal.  Skin: She is not diaphoretic.        Assessment/Plan: Please see individual problem list.  Rash and nonspecific skin eruption Patient's rash most likely related to shingles versus contact dermatitis. Given history concern would be for shingles. Patient is unsure if she had chickenpox as a child though she was exposed her children multiple times with chickenpox and did not contract chickenpox. I think this would argue for her having had immunity to varicella. We will check a varicella antibody. We will proceed with treatment with Valtrex. Discussed potential side effects of this medication with the patient and she will contact us if she develops them.  If varicella antibody negative would consider topical steroid for contact dermatitis. Discussed avoidance of pregnant women, people that have not had chickenpox, and immunocompromised patients. She will keep the area covered as best as possible.   Orders Placed This Encounter  Procedures  . Varicella zoster antibody, IgG    Meds ordered this encounter  Medications  . valACYclovir (VALTREX) 1000 MG tablet    Sig: Take 1 tablet (1,000 mg total) by mouth 2 (two) times daily.    Dispense:  14 tablet    Refill:  0    Tommi Rumps, MD [place]

## 2016-11-25 NOTE — Telephone Encounter (Signed)
Dr.Tullo patient  

## 2016-11-25 NOTE — Patient Instructions (Signed)
Nice to see you. You potentially have shingles or a contact dermatitis. We will test to see if you had chickenpox when you were younger. If you did we will proceed with treatment of shingles. If you develop spreading rash or increasing pain please let us know.

## 2016-11-26 LAB — VARICELLA ZOSTER ANTIBODY, IGG: Varicella IgG: 651.9 Index — ABNORMAL HIGH (ref <135.00)

## 2016-11-26 NOTE — Telephone Encounter (Signed)
Did you try to reach patient? Concerning visit yesterday.

## 2016-11-26 NOTE — Telephone Encounter (Signed)
I have not called the patient. 

## 2016-11-26 NOTE — Telephone Encounter (Signed)
Dr. Caryl Bis had called patient in reference to DX of shingles.

## 2016-12-02 ENCOUNTER — Ambulatory Visit: Payer: Medicare Other | Admitting: Family Medicine

## 2016-12-06 ENCOUNTER — Telehealth: Payer: Self-pay | Admitting: Cardiovascular Disease

## 2016-12-06 NOTE — Telephone Encounter (Signed)
Medication Samples have been provided to the patient.  Drug name: Eliquis       Strength: 5mg         Qty: 4 boxes  LOT: [id number]  Exp.Date: 02/2019  Dosing instructions: One tablet twice daily  The patient has been instructed regarding the correct time, dose, and frequency of taking this medication, including desired effects and most common side effects.   [name] [name] 9:16 AM 12/06/2016

## 2016-12-07 IMAGING — CT CT OF THE RIGHT KNEE WITHOUT CONTRAST
3 series · 12 of 33 positions shown, 14 images · non-contrast
Comparison: None.

CLINICAL DATA: Right knee pain since a fall 4 weeks ago. Abnormal
radiograph demonstrating tibial plateau fracture.

EXAM:
CT OF THE RIGHT KNEE WITHOUT CONTRAST
TECHNIQUE: Multidetector CT imaging of the right knee was performed according
to the standard protocol. Multiplanar CT image reconstructions were
also generated.

[Series 6: ax knee soft tissue 2.0 · axial · 0.25mm/px · z∈[+688,+831]mm · 4 of 105 slices shown, 5 images]
[im 17/105  soft-tissue]
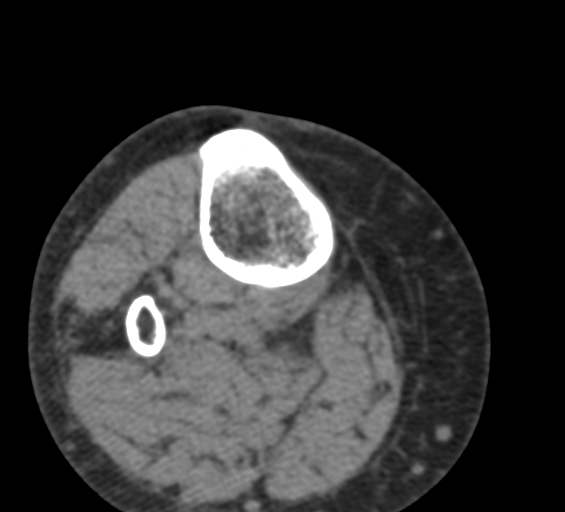
[im 17/105  bone]
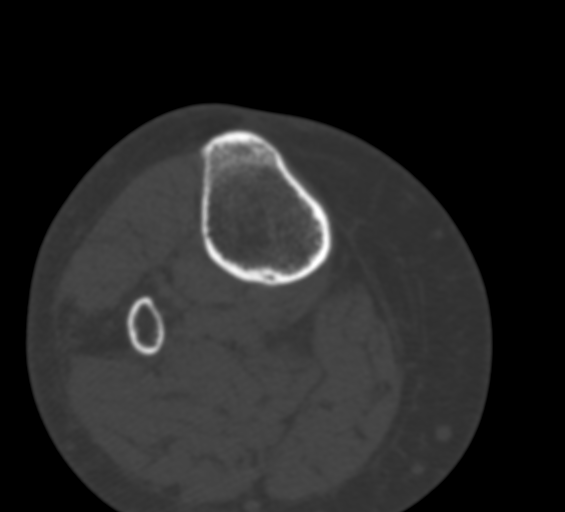
[im 41/105  bone]
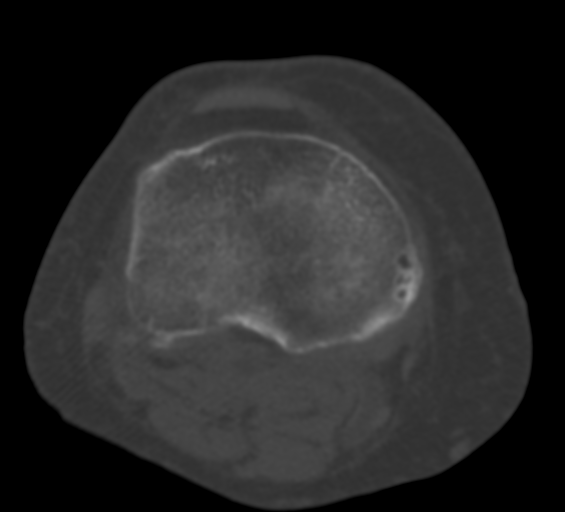
[im 65/105  bone]
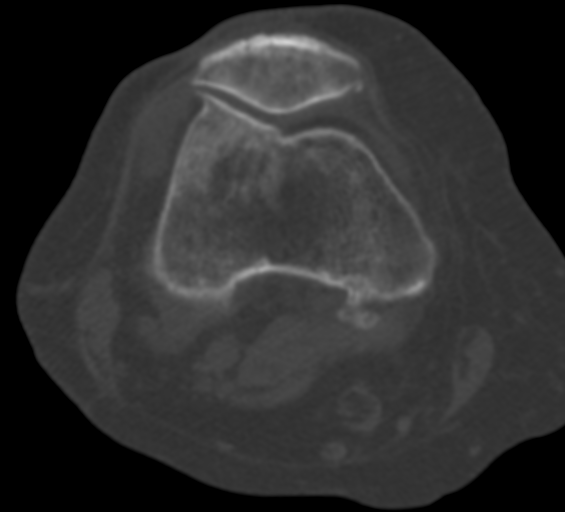
[im 89/105  bone]
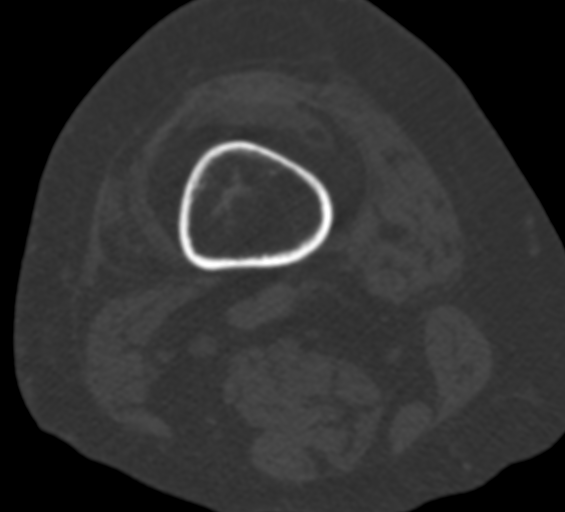

[Series 8: coronal knee st 2.0 · coronal · 0.29mm/px · 3 of 67 slices shown]
[im 14/67  bone]
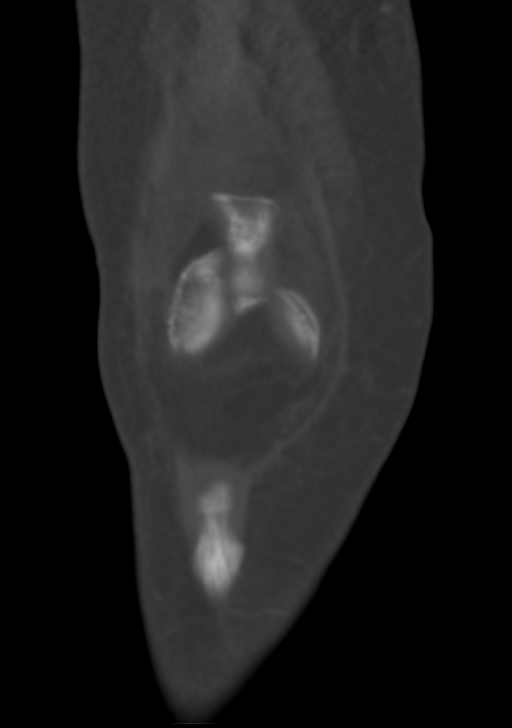
[im 27/67  bone]
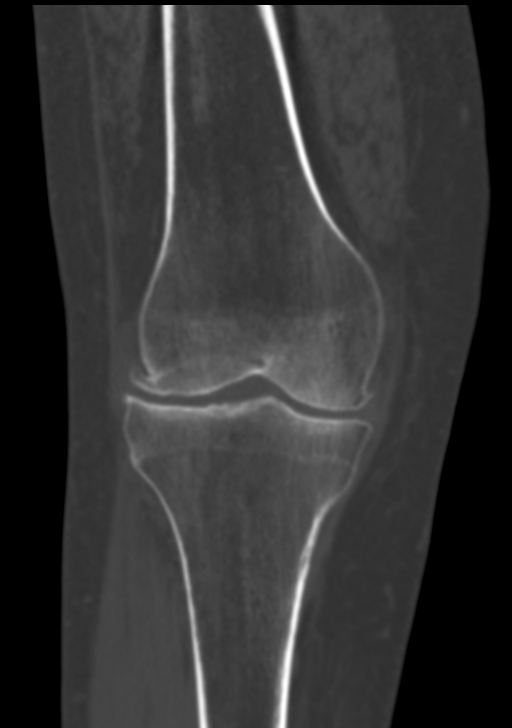
[im 40/67  bone]
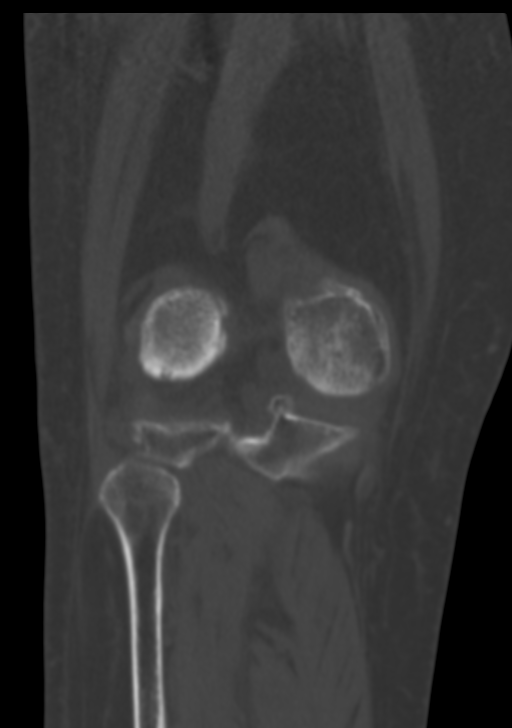

[Series 10: sagittal knee st 2.0 · sagittal · 0.26mm/px · 5 of 69 slices shown, 6 images]
[im 23/69  bone]
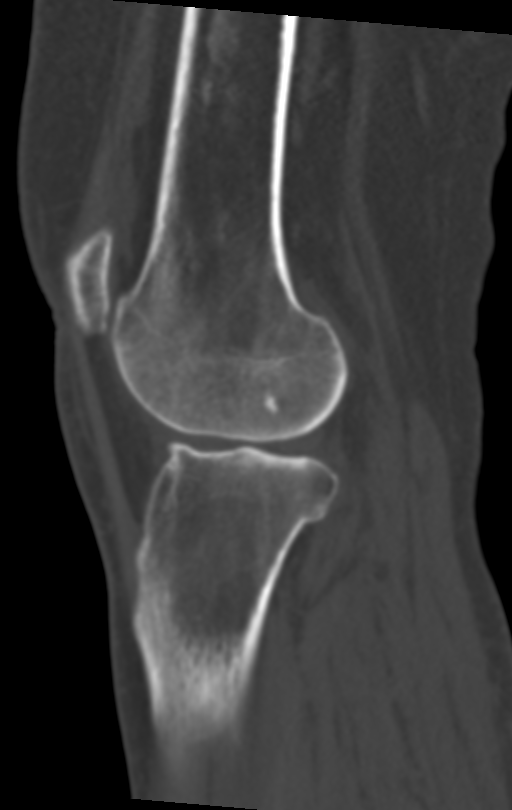
[im 29/69  bone]
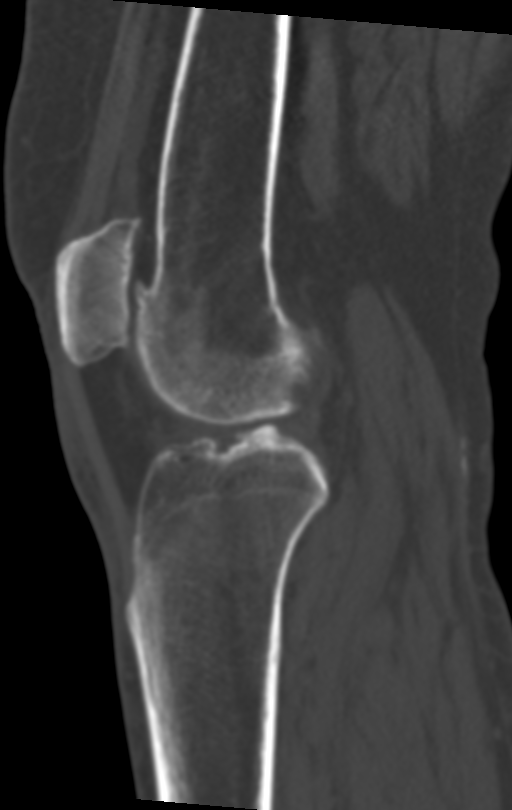
[im 35/69  soft-tissue]
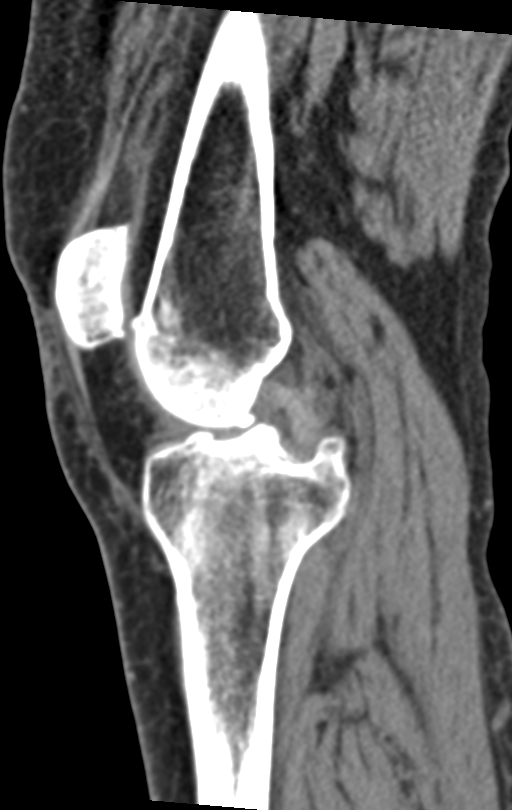
[im 35/69  bone]
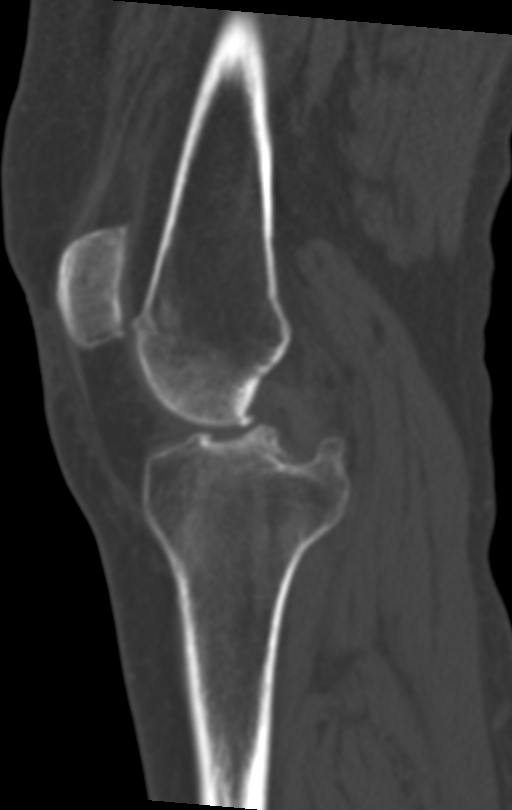
[im 40/69  bone]
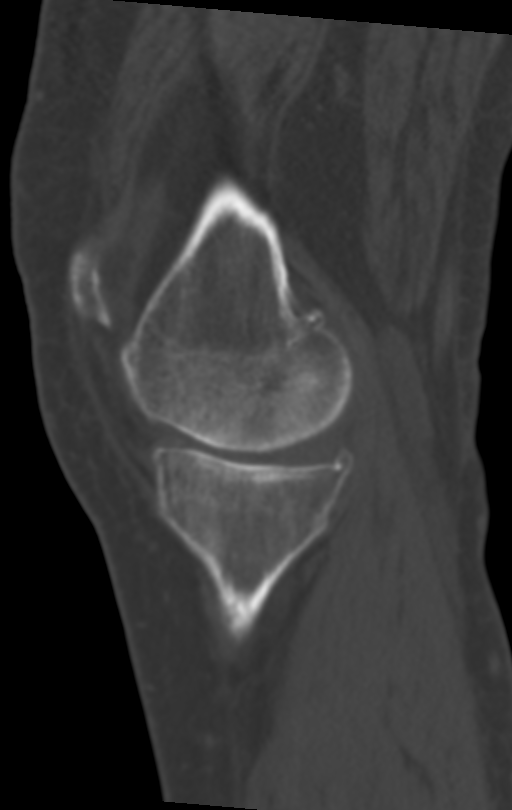
[im 46/69  bone]
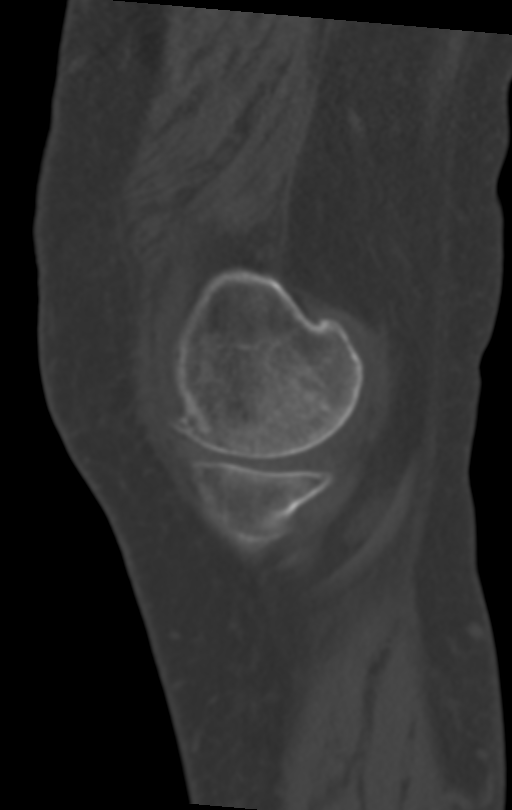

[12 of 33 positions shown; findings below may reference images not displayed]

FINDINGS: There is no fracture or dislocation. There is a small joint effusion
with a tiny Baker's cyst. Deep articular surface of the lateral
tibial plateau is undulating which simulates a impaction fracture
but this is due to asymmetric osteoarthritis. There are multiple
subcortical cysts at the site.

There is joint space narrowing, and lateral more than medial with

Tricompartmental marginal osteophytes. There is diffuse thinning of
the articular cartilage of the lateral facet of the patella.

The posterior cruciate ligament in the medial and lateral collateral
ligaments are intact. Anterior cruciate ligament is not well defined
but appears to be intact. The menisci are not well enough seen for
evaluation.

The extensor mechanism appears normal.
IMPRESSION: 1. Tricompartmental osteoarthritis, most prominent in the lateral
compartment with unusual undulation of the articular surface of the
lateral tibial plateau with simulates a fracture or but there is no
acute fracture.
2. Small joint effusion with small popliteal cyst.

## 2016-12-08 ENCOUNTER — Other Ambulatory Visit: Payer: Self-pay

## 2016-12-08 MED ORDER — AMIODARONE HCL 200 MG PO TABS
100.0000 mg | ORAL_TABLET | Freq: Every day | ORAL | 6 refills | Status: DC
Start: 2016-12-08 — End: 2017-02-04

## 2016-12-08 NOTE — Telephone Encounter (Signed)
Refill sent for amiodarone 200 mg take 1/2 tablet daily. 

## 2016-12-17 ENCOUNTER — Telehealth: Payer: Self-pay | Admitting: Cardiovascular Disease

## 2016-12-17 NOTE — Telephone Encounter (Signed)
Returned call to patient. She states this morning she woke up and felt her "heartbeat was irregular."  It lasted about 45 minutes and she experienced palpitations and some dizziness. BP 166/78, HR 84 during that time. She also said her lips felt numb and tingling.  She experiences indigestion from time to time and has that right now which she just took  Maalox for.  She took her morning cardiac medications as prescribed (eliquis, amiodarone, norvasc). At this time, patient "feels better." Most recent BP 122/59, HR 57,  Denies chest pain, palpitations, or dizziness at the current time.  Her lips still feel a little tingly. She denies weakness in upper and lower extremities and she says she can move them equally and walk without problems.  Her speech was clear on the phone, no slurring or words.  Patient has hx persistent a fib with multiple cardioversions in the past. Most recent was August 2014. EKG on 09/30/16 OV with Arida showed NSR with right bundle branch block and nonspecific T wave changes.  Advised patient that if these symptoms reoccurred to call 911. If she continued to have numbness or tingling in her lips and if it moved into her face and other parts of her body then to call 911 immediately as this could be a sign of stroke.   Will route to Christell Faith, PA for advice.

## 2016-12-17 NOTE — Telephone Encounter (Signed)
Pt is calling stating she doesn't want to go ED but is having some issues She has irregular heart and it was acting, her BP this morning was  166/78 hr 84  She states she has just taken her medications and everything seems to be getting back normal, and she feels fine now.  Has had some indigestion thinks it may be caused by that  Would like to come in for an EKG  Please call patient she is worried it may be something else.

## 2016-12-17 NOTE — Telephone Encounter (Signed)
Can add patient to the open slot at 3 PM on 1/3. Agree with continued treatment given she is feeling better.

## 2016-12-17 NOTE — Telephone Encounter (Signed)
Called aptient and appt added for 12/22/16 at 3pm. Patient verbalized understanding and will call 911 if symptoms come back.

## 2016-12-21 ENCOUNTER — Other Ambulatory Visit: Payer: Self-pay

## 2016-12-21 MED ORDER — AMLODIPINE BESYLATE 2.5 MG PO TABS: 2.5000 mg | ORAL_TABLET | Freq: Two times a day (BID) | ORAL | 3 refills | Status: DC

## 2016-12-22 ENCOUNTER — Telehealth: Payer: Self-pay | Admitting: Cardiovascular Disease

## 2016-12-22 ENCOUNTER — Encounter: Payer: Self-pay | Admitting: Physician Assistant

## 2016-12-22 ENCOUNTER — Ambulatory Visit (INDEPENDENT_AMBULATORY_CARE_PROVIDER_SITE_OTHER): Payer: Medicare Other | Admitting: Physician Assistant

## 2016-12-22 VITALS — BP 118/64 | HR 54 | Ht 64.0 in | Wt 160.5 lb

## 2016-12-22 DIAGNOSIS — Z0389 Encounter for observation for other suspected diseases and conditions ruled out: Secondary | ICD-10-CM

## 2016-12-22 DIAGNOSIS — R002 Palpitations: Secondary | ICD-10-CM | POA: Diagnosis not present

## 2016-12-22 DIAGNOSIS — I43 Cardiomyopathy in diseases classified elsewhere: Secondary | ICD-10-CM

## 2016-12-22 DIAGNOSIS — R079 Chest pain, unspecified: Secondary | ICD-10-CM | POA: Diagnosis not present

## 2016-12-22 DIAGNOSIS — I10 Essential (primary) hypertension: Secondary | ICD-10-CM

## 2016-12-22 DIAGNOSIS — R000 Tachycardia, unspecified: Secondary | ICD-10-CM | POA: Diagnosis not present

## 2016-12-22 DIAGNOSIS — I872 Venous insufficiency (chronic) (peripheral): Secondary | ICD-10-CM

## 2016-12-22 DIAGNOSIS — I481 Persistent atrial fibrillation: Secondary | ICD-10-CM | POA: Diagnosis not present

## 2016-12-22 DIAGNOSIS — I4819 Other persistent atrial fibrillation: Secondary | ICD-10-CM

## 2016-12-22 DIAGNOSIS — IMO0001 Reserved for inherently not codable concepts without codable children: Secondary | ICD-10-CM

## 2016-12-22 NOTE — Telephone Encounter (Signed)
Received refill request for Norvasc 5 mg. Order states to take two 2.5mg  tabs daily. Med list states that pt is taking differently: 2.5mg  in morning and 5mg  in evening. I called pt to verify this is how she is taking and she agreed. Pt stated that Dr.Arida is ok with this and that she will also take an extra 2.5mg  if needed. Pt states that she prefers 2.5mg  because the 5mg  crumbles when she cuts in half. Please advise. Thanks.

## 2016-12-22 NOTE — Patient Instructions (Addendum)
Medication Instructions:  Your physician has recommended you make the following change in your medication:  Start taking amlodipine 5mg  in the morning. If your systolic blood pressure (top number) is greater than 135 in the evening, you may take 2.5mg  at night.   Labwork: none  Testing/Procedures: Your physician has requested that you have a lexiscan myoview. For further information please visit [email or web address]. Please follow instruction sheet, as given.  [place]  Your caregiver has ordered a Stress Test with nuclear imaging. The purpose of this test is to evaluate the blood supply to your heart muscle. This procedure is referred to as a "Non-Invasive Stress Test." This is because other than having an IV started in your vein, nothing is inserted or "invades" your body. Cardiac stress tests are done to find areas of poor blood flow to the heart by determining the extent of coronary artery disease (CAD). Some patients exercise on a treadmill, which naturally increases the blood flow to your heart, while others who are  unable to walk on a treadmill due to physical limitations have a pharmacologic/chemical stress agent called Lexiscan . This medicine will mimic walking on a treadmill by temporarily increasing your coronary blood flow.   Please note: these test may take anywhere between 2-4 hours to complete  PLEASE REPORT TO [facility] AT THE FIRST DESK WILL DIRECT YOU WHERE TO GO  Date of Procedure:_Friday, January 5__  Arrival Time for Procedure:___8:15am____  Instructions regarding medication:   _xx___:  Hold lasix the morning of your test.  You may take all other medications with a sip of water.   PLEASE NOTIFY THE OFFICE AT LEAST 43 HOURS IN ADVANCE IF YOU ARE UNABLE TO KEEP YOUR APPOINTMENT.  [phone] AND  PLEASE NOTIFY NUCLEAR MEDICINE AT [facility] AT LEAST 24 HOURS IN ADVANCE IF YOU ARE UNABLE TO KEEP YOUR APPOINTMENT. [phone]  How to  prepare for your Myoview test:  1. Do not eat or drink after midnight 2. No caffeine for 24 hours prior to test 3. No smoking 24 hours prior to test. 4. Your medication may be taken with water.  If your doctor stopped a medication because of this test, do not take that medication. 5. Ladies, please do not wear dresses.  Skirts or pants are appropriate. Please wear a short sleeve shirt. 6. No perfume, cologne or lotion. 7. Wear comfortable walking shoes. No heels!            Follow-Up: Your physician recommends that you schedule a follow-up appointment in: 1 week with Dr. Fletcher Anon or Christell Faith, PA-C   Any Other Special Instructions Will Be Listed Below (If Applicable).     If you need a refill on your cardiac medications before your next appointment, please call your pharmacy.  Cardiac Nuclear Scanning A cardiac nuclear scan is used to check your heart for problems, such as the following:  A portion of the heart is not getting enough blood.  Part of the heart muscle has died, which happens with a heart attack.  The heart wall is not working normally.  In this test, a radioactive dye (tracer) is injected into your bloodstream. After the tracer has traveled to your heart, a scanning device is used to measure how much of the tracer is absorbed by or distributed to various areas of your heart. LET [facility] CARE PROVIDER KNOW ABOUT:  Any allergies you have.  All medicines you are taking, including vitamins, herbs, eye drops, creams,  and over-the-counter medicines.  Previous problems you or members of your family have had with the use of anesthetics.  Any blood disorders you have.  Previous surgeries you have had.  Medical conditions you have.  RISKS AND COMPLICATIONS Generally, this is a safe procedure. However, as with any procedure, problems can occur. Possible problems include:   Serious chest pain.  Rapid heartbeat.  Sensation of warmth in your chest. This  usually passes quickly. BEFORE THE PROCEDURE Ask your health care provider about changing or stopping your regular medicines. PROCEDURE This procedure is usually done at a hospital and takes 2-4 hours.  An IV tube is inserted into one of your veins.  Your health care provider will inject a small amount of radioactive tracer through the tube.  You will then wait for 20-40 minutes while the tracer travels through your bloodstream.  You will lie down on an exam table so images of your heart can be taken. Images will be taken for about 15-20 minutes.  You will exercise on a treadmill or stationary bike. While you exercise, your heart activity will be monitored with an electrocardiogram (ECG), and your blood pressure will be checked.  If you are unable to exercise, you may be given a medicine to make your heart beat faster.  When blood flow to your heart has peaked, tracer will again be injected through the IV tube.  After 20-40 minutes, you will get back on the exam table and have more images taken of your heart.  When the procedure is over, your IV tube will be removed. AFTER THE PROCEDURE  You will likely be able to leave shortly after the test. Unless your health care provider tells you otherwise, you may return to your normal schedule, including diet, activities, and medicines.  Make sure you find out how and when you will get your test results. This information is not intended to replace advice given to you by your health care provider. Make sure you discuss any questions you have with your health care provider. Document Released: 12/31/2004 Document Revised: 12/11/2013 Document Reviewed: 11/14/2013 Elsevier Interactive Patient Education  2017 [employer].

## 2016-12-22 NOTE — Progress Notes (Signed)
Cardiology Office Note Date:  12/22/2016  Patient ID:  [name] [name], DOB [birth year], MRN [name] PCP:  [name], MD  Cardiologist:  Dr. Fletcher Anon, MD    Chief Complaint: Afib follow up  History of Present Illness: [name] [name] is a 81 y.o. female with history of normal coronary arteries by cardiac cath x 2 most recently on 05/03/2011, persistent Afib on s/p multiple cardioversions in the past on Eliquis, previous tachy-mediated cardiomyopathy with resolution with rate and rhythm control, history of Mobitz 2 AV block which resolved after decreasing her dose of amiodarone to 100 mg daily, labile HTN who presents for evaluation of palpitations, elevated BP, and SOB.  Most recent cardioversion was in August 123456 complicated by bradycardia post-procedure. Most recent echo from November 2016 showed normal LV systolic function with mildly dilated left atirum, mild mitral regurgitation, and mild to moderate pulmonary hypertension. Prior episode of blunt head trauma in 08/2016 with negative CT head for bleeding. At her last follow up with Dr. Fletcher Anon on 09/30/2016 she had noted some worsening LE edema and had recently been started on low-dose Lasix by PCP, she was otherwise doing well. She was maintaining sinus rhythm at that time and it was felt her LE swelling was mostly 2/2 chronic venous insufficiency with support stockings and leg elevation being advised in addition to prn Lasix. Possible shingles in early December vs contact dermatitis.   She called the office on 12/17/16 with complaints of palpitations lasting approximately 45 minutes with associated dizziness. BP was noted to be elevated at 166/78, heart rate of 84 bpm. Status post taking her amlodipine 2.5 mg she felt much better with improved BP at 122/59 and heart rate of 57 bpm. She also noted some peri-oral numbness without further findings.   Today, she comes in stating her main concern is nonexertional chest discomfort with associated  increase in belching and generalized fatigue. She notes a history of a hiatal hernia, though over the past 4-5 days her belching has gotten worse and has developed a diffuse chest discomfort that she states is dull in nature. Pressure is worse with belching. Never with exertional symptoms. She does not feel like she has been going in and out of Afib. Weight has been stable. Currently, without pain. Has not missed any doses of Eliquis or amiodarone. BP at home has been in the 123456 systolic in the morning-->takes amlodipine 2.5 mg, and in the 1-teens systolic in the evening-->takes amlodipine 5 mg qhs. Heart rate at baseline frequently in the low to mid 50's bpm and asymptomatic. Not taking any Lasix. Wears compression stockings and elevates legs when sitting. LE swelling stable.    Past Medical History:  Diagnosis Date  . Arthritis   . Atrial flutter ([place]) 02/2011   s/p cardioversion   . Cancer (HCC)    BREAST  . Chronic kidney disease    acute renal failure secondary to dehydration which is now resolved  . Cystocele   . GERD (gastroesophageal reflux disease)   . Headache(784.0)    chronic  . Hyperlipidemia   . Hypertension   . Knee fracture   . Light headedness    due to dehydration  . Mobitz type 2 second degree atrioventricular block    a. felt to be 2/2 amiodarone, resolved with decreased amiodarone dose  . Persistent atrial fibrillation ([place])    a. status post multiple DCCV, most recently in 07/2013  . PONV (postoperative nausea and vomiting)    oxycodone and  codiene cause N/V   . Pre-syncope   . 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 Venous insufficiency   . Vertigo     Past Surgical History:  Procedure Laterality Date  . ABDOMINAL HYSTERECTOMY  1990  . APPENDECTOMY    . AUGMENTATION MAMMAPLASTY Bilateral 1986   implants  . AUGMENTATION MAMMAPLASTY  1990  . AUGMENTATION MAMMAPLASTY  2011  . CARDIAC CATHETERIZATION    . CARDIOVERSION     x 3  . CARDIOVERSION    .  CATARACT EXTRACTION W/PHACO Right 09/21/2016   Procedure: CATARACT EXTRACTION PHACO AND INTRAOCULAR LENS PLACEMENT (IOC);  Surgeon: Birder Robson, MD;  Location: [facility] ORS;  Service: Ophthalmology;  Laterality: Right;  [place] 44.1 AP% 16.5 CDE 7.30 Fluid Pack Lot [id number]  . CATARACT EXTRACTION W/PHACO Left 10/19/2016   Procedure: CATARACT EXTRACTION PHACO AND INTRAOCULAR LENS PLACEMENT (IOC);  Surgeon: Birder Robson, MD;  Location: [facility] ORS;  Service: Ophthalmology;  Laterality: Left;  [place] 53.7 AP% 19.5 CDE 10.45 Fluid pack lot # [id number]  . CHOLECYSTECTOMY    . COMBINED AUGMENTATION MAMMAPLASTY AND ABDOMINOPLASTY    . JOINT REPLACEMENT Left 06/04/2013   left knee  . KNEE ARTHROSCOPY Right 08/16/2016   Procedure: ARTHROSCOPY KNEE, tear posterior horn medial meniscus, tear anterior and posterior horns of lateral meniscus, chondromalacia of lateral compartment grade 3 patella and grade 4 medial;  Surgeon: Dereck Leep, MD;  Location: [facility] ORS;  Service: Orthopedics;  Laterality: Right;  . MASTECTOMY  1986   Bilateral with silicone  breast implants, s/p saline replacements  . Multiple orthopedic procedures    . NOSE SURGERY    . TOTAL KNEE ARTHROPLASTY Left     Current Outpatient Prescriptions  Medication Sig Dispense Refill  . acetaminophen (TYLENOL) 500 MG tablet Take 1,000 mg by mouth every 6 (six) hours as needed (pain).     [name] aluminum-magnesium hydroxide 200-200 MG/5ML suspension Take 20 mLs by mouth every 6 (six) hours as needed for indigestion.     [name] amiodarone (PACERONE) 200 MG tablet Take 0.5 tablets (100 mg total) by mouth daily. 15 tablet 6  . amLODipine (NORVASC) 2.5 MG tablet Take 1-2 tablets (2.5-5 mg total) by mouth 2 (two) times daily. 2.5 in am and 5mg  in the evening 180 tablet 3  . apixaban (ELIQUIS) 5 MG TABS tablet Take 1 tablet (5 mg total) by mouth 2 (two) times daily. 60 tablet 6  . cetirizine (ZYRTEC) 10 MG tablet Take 10 mg by mouth daily as needed for allergies.     . Cholecalciferol (VITAMIN D3) 1000 UNITS CAPS Take 1,000 Units by mouth daily.     . fluticasone (FLONASE) 50 MCG/ACT nasal spray Place 2 sprays into both nostrils daily. 16 g 0  . furosemide (LASIX) 20 MG tablet Take 1 tablet (20 mg total) by mouth daily. As needed for edema not more than every other day (Patient taking differently: Take 20 mg by mouth every other day as needed. As needed for edema) 45 tablet 1  . OVER THE COUNTER MEDICATION Take 1 capsule by mouth daily. Probiotic    . pravastatin (PRAVACHOL) 20 MG tablet TAKE ONE (1) TABLET BY MOUTH EVERY DAY 90 tablet 0  . ranitidine (ZANTAC) 150 MG tablet Take 1 tablet (150 mg total) by mouth 2 (two) times daily. 60 tablet 2  . sodium chloride (OCEAN) 0.65 % SOLN nasal spray Place 1 spray into both nostrils 2 (two) times daily as needed for congestion.     [name]  valACYclovir (VALTREX) 1000 MG tablet Take 1 tablet (1,000 mg total) by mouth 2 (two) times daily. 14 tablet 0  . HYDROcodone-acetaminophen (NORCO) 5-325 MG tablet Take 1-2 tablets by mouth every 4 (four) hours as needed for moderate pain. (Patient not taking: Reported on 12/22/2016) 40 tablet 0   No current facility-administered medications for this visit.     Allergies:   Iodine; Biaxin [clarithromycin]; Codeine; Enalapril; Fluocinonide; Oxycodone; Promethazine; Warfarin and related; and Xarelto [rivaroxaban]   Social History:  The patient  reports that she has never smoked. She has never used smokeless tobacco. She reports that she does not drink alcohol or use drugs.   Family History:  The patient's family history includes Breast cancer in her maternal grandmother, sister, sister, and sister; Cancer in her father, maternal grandmother, sister, and sister; Heart disease in her mother and son.  ROS:   Review of Systems  Constitutional: Positive for malaise/fatigue. Negative for chills, diaphoresis, fever and weight loss.  HENT: Negative for congestion.   Eyes: Negative for  discharge and redness.  Respiratory: Positive for shortness of breath. Negative for cough, hemoptysis, sputum production and wheezing.   Cardiovascular: Positive for chest pain, palpitations and leg swelling. Negative for orthopnea, claudication and PND.  Gastrointestinal: Positive for heartburn. Negative for abdominal pain, blood in stool, melena, nausea and vomiting.       Belching  Genitourinary: Negative for hematuria.  Musculoskeletal: Negative for falls and myalgias.  Skin: Negative for rash.  Neurological: Positive for weakness. Negative for dizziness, tingling, tremors, sensory change, speech change, focal weakness and loss of consciousness.  Endo/Heme/Allergies: Does not bruise/bleed easily.  Psychiatric/Behavioral: Negative for substance abuse. The patient is not nervous/anxious.   All other systems reviewed and are negative.    PHYSICAL EXAM:  VS:  BP 118/64 (BP Location: Left Arm, Patient Position: Sitting, Cuff Size: Normal)   Pulse (!) 54   Ht 5\' 4"  (1.626 m)   Wt 160 lb 8 oz (72.8 kg)   BMI 27.55 kg/m  BMI: Body mass index is 27.55 kg/m.  Physical Exam  Constitutional: She is oriented to person, place, and time. She appears well-developed and well-nourished.  HENT:  Head: Normocephalic and atraumatic.  Eyes: Right eye exhibits no discharge. Left eye exhibits no discharge.  Neck: Normal range of motion. No JVD present.  Cardiovascular: Regular rhythm, S1 normal, S2 normal and normal heart sounds.  Bradycardia present.  Exam reveals no distant heart sounds, no friction rub, no midsystolic click and no opening snap.   No murmur heard. Pulmonary/Chest: Effort normal and breath sounds normal. No respiratory distress. She has no decreased breath sounds. She has no wheezes. She has no rales. She exhibits no tenderness.  Abdominal: Soft. She exhibits no distension. There is no tenderness.  Musculoskeletal: She exhibits no edema.  1+ pitting edema to the bilateral shins.     Neurological: She is alert and oriented to person, place, and time.  Skin: Skin is warm and dry. No cyanosis. Nails show no clubbing.  Psychiatric: She has a normal mood and affect. Her speech is normal and behavior is normal. Judgment and thought content normal.     EKG:  Was ordered and interpreted by me today. Shows sinus bradycardia with sinus arrhythmia, 54 bpm, RBBB, rare blocked PAC, QTc 458 msec, diffuse nonspecific st/t changes (old)  Recent Labs: 03/29/2016: Hemoglobin 13.2 04/22/2016: Platelets 241 06/28/2016: ALT 14; BUN 25; Creatinine, Ser 1.29; Potassium 4.4; Sodium 140  No results found for requested labs  within last 8760 hours.   CrCl cannot be calculated (Patient's most recent lab result is older than the maximum 21 days allowed.).   Wt Readings from Last 3 Encounters:  12/22/16 160 lb 8 oz (72.8 kg)  11/25/16 158 lb (71.7 kg)  11/01/16 158 lb (71.7 kg)     Other studies reviewed: Additional studies/records reviewed today include: summarized above  ASSESSMENT AND PLAN:  1. Chest discomfort/belching/generalized fatigue: Schedule Lexiscan Myoview to evaluate for high-risk ischemia 9she cannto treadmill 2/2 fatigue). She does have a history of hiatal hernia and this certainly may be a symptom of this; however, must rule out ischemic etiology given multifactorial symptoms and 5+ years since her last ischemic evaluation. On Eliquis in place of ASA. Not on beta blocker 2/2 sinus bradycardia.   2. Palpitations: It appears her resting heart rate is generally in the 50's bpm range. She was symptomatic with reported palpitations with a heart rate in the 80-90 bpm range. Given her complicated Afib history and heart rate associated with these palpitations I do not suspect she has been going in and out of Afib as I do not think she was spontaneously convert to sinus rhythm without intervention given her history (she does not suspect this either as she reports being very symptomatic  with her Afib. Given the above reported heart rates at which she is symptomatic I also do not suspect SVT or ventricular ectopy. I suspect she is just symptomatic with a faster than her baseline heart rate in sinus rhythm. I cannot add prn beta blocker given the short duration of these episodes nor can I add scheduled beta blocker given her bradycardia. The only rate-limiting medication she is taking at this time is amiodarone 100 mg daily. Offered Zio patch monitor though suspect this would be of low yield, she declined this at this time. Continue current dose of amiodarone in an effort to maintain sinus rhythm.  3. Persistent Afib: She is maintaining sinus rhythm with a bradycardic rate at this time and is asymptomatic. Continue amiodarone 100 mg daily. Recent LFT from 06/28/16 ok. TSH from 04/2015 normal, needs recheck at follow up. QTx on EKG today stable. Continue Eliquis 5 mg bid (does not mean 2/3 reduced dose criteria). CHADS2VASc at least 4 (HTN, age x 2, sex category).  4. HTN: BP readings in the morning prior to taking her medication will be 123456 systolic. She has been taking amlodipine 2.5 mg q AM. Evening BP readings have been in the 1-teens systolic, takes amlodipine 5 mg after these readings. She will have a trial of amlodipine 5 mg q AM and prn amlodipine 2.5 mg q PM if systolic BP is > [id number] mmHg.   5. LE swelling: Likely venous insufficiency. Stable. Not taking prn Lasix. Continue compression hose and elevation of legs when sitting.   Disposition: F/u with Dr. Fletcher Anon 1 week s/p stress test.   Current medicines are reviewed at length with the patient today.  The patient did not have any concerns regarding medicines.  Melvern Banker PA-C 12/22/2016 3:03 PM     [address], [place] [address] [phone]

## 2016-12-22 NOTE — Telephone Encounter (Signed)
Refill submitted yesterday. She reported on medication list at [place] that she takes amlodipine 2.5mg  AM and 5mg  PM.

## 2016-12-23 ENCOUNTER — Telehealth: Payer: Self-pay | Admitting: *Deleted

## 2016-12-23 NOTE — Telephone Encounter (Signed)
No answer. Left detail message with instructions for myoview stress test tomorrow, 12/24/16, and to arrive at 08:15am.

## 2016-12-24 ENCOUNTER — Encounter
Admission: RE | Admit: 2016-12-24 | Discharge: 2016-12-24 | Disposition: A | Discharge status: Home / Self Care | Payer: Medicare Other | Source: Ambulatory Visit | Attending: Physician Assistant | Admitting: Physician Assistant

## 2016-12-24 DIAGNOSIS — R079 Chest pain, unspecified: Secondary | ICD-10-CM | POA: Diagnosis not present

## 2016-12-24 LAB — NM MYOCAR MULTI W/SPECT W/WALL MOTION / EF
Estimated workload: 1 METS
Exercise duration (min): 0 min
Exercise duration (sec): 0 s
LV dias vol: 85 mL (ref 46–106)
LV sys vol: 21 mL
MPHR: 142 {beats}/min
Peak HR: 68 {beats}/min
Percent HR: 52 %
Percent of predicted max HR: 47 %
Rest HR: 61 {beats}/min
SDS: 4
SRS: 4
SSS: 6
Stage 1 Grade: 0 %
Stage 1 HR: 58 {beats}/min
Stage 1 Speed: 0 mph
Stage 2 Grade: 0 %
Stage 2 HR: 58 {beats}/min
Stage 2 Speed: 0 mph
Stage 3 Grade: 0 %
Stage 3 HR: 68 {beats}/min
Stage 3 Speed: 0 mph
Stage 4 DBP: 37 mmHg
Stage 4 Grade: 0 %
Stage 4 HR: 65 {beats}/min
Stage 4 SBP: 101 mmHg
Stage 4 Speed: 0 mph
TID: 0.92

## 2016-12-24 MED ORDER — REGADENOSON 0.4 MG/5ML IV SOLN
0.4000 mg | Freq: Once | INTRAVENOUS | Status: AC
  Administered 2016-12-24: 0.4 mg via INTRAVENOUS

## 2016-12-24 MED ORDER — TECHNETIUM TC 99M TETROFOSMIN IV KIT
13.0000 | PACK | Freq: Once | INTRAVENOUS | Status: AC | PRN
  Administered 2016-12-24: 12.802 via INTRAVENOUS

## 2016-12-24 MED ORDER — TECHNETIUM TC 99M TETROFOSMIN IV KIT
29.6140 | PACK | Freq: Once | INTRAVENOUS | Status: AC | PRN
  Administered 2016-12-24: 29.614 via INTRAVENOUS

## 2016-12-27 ENCOUNTER — Other Ambulatory Visit: Payer: Self-pay

## 2016-12-27 MED ORDER — AMLODIPINE BESYLATE 2.5 MG PO TABS: 2.5000 mg | ORAL_TABLET | Freq: Two times a day (BID) | ORAL | 3 refills | Status: DC

## 2016-12-31 ENCOUNTER — Ambulatory Visit (INDEPENDENT_AMBULATORY_CARE_PROVIDER_SITE_OTHER): Payer: Medicare Other | Admitting: Cardiovascular Disease

## 2016-12-31 ENCOUNTER — Encounter: Payer: Self-pay | Admitting: Cardiovascular Disease

## 2016-12-31 VITALS — BP 112/60 | HR 60 | Ht 64.5 in | Wt 156.5 lb

## 2016-12-31 DIAGNOSIS — I481 Persistent atrial fibrillation: Secondary | ICD-10-CM | POA: Diagnosis not present

## 2016-12-31 DIAGNOSIS — I10 Essential (primary) hypertension: Secondary | ICD-10-CM

## 2016-12-31 DIAGNOSIS — I4819 Other persistent atrial fibrillation: Secondary | ICD-10-CM

## 2016-12-31 DIAGNOSIS — K219 Gastro-esophageal reflux disease without esophagitis: Secondary | ICD-10-CM | POA: Diagnosis not present

## 2016-12-31 MED ORDER — PANTOPRAZOLE SODIUM 40 MG PO TBEC: 40.0000 mg | DELAYED_RELEASE_TABLET | Freq: Every day | ORAL | 11 refills | Status: DC

## 2016-12-31 NOTE — Progress Notes (Signed)
Cardiology Office Note   Date:  12/31/2016   ID:  [name] [name], DOB July 28, 1936, MRN [id number]  PCP:  [name], MD  Cardiologist:   [name], MD   Chief Complaint  Patient presents with  . other    F/u myoview. Meds reviewed verbally with pt.      History of Present Illness: [name] [name] is a 81 y.o. female who presents for a followup visit regarding persistent atrial fibrillation.   She has known history of persistent atrial fibrillation/flutter status post multiple cardioversions in the past. 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 She had cardiac catheterization twice in the past without significant coronary artery disease.  Most recent cardioversion was in August of 2014. She did have bradycardia post cardioversion. She has known history of labile hypertension. Most recent Echocardiogram in November 2016 showed normal LV systolic function with mildly dilated left atrium, mild mitral regurgitation, and mild to moderate pulmonary hypertension.  She had intermittent Mobitz 2 AV block which resolved after decreasing the dose of amiodarone to 100 mg once daily.  She is on long-term anticoagulation with Eliquis. She was seen recently by [facility] for atypical chest pain. She underwent a pharmacologic nuclear stress test which showed no evidence of ischemia with normal ejection fraction. The chest pain was described as heartburn and happened at night. She does have history of GERD. No more palpitations.   Past Medical History:  Diagnosis Date  . Arthritis   . Atrial flutter ([place]) 02/2011   s/p cardioversion   . Cancer (HCC)    BREAST  . Chronic kidney disease    acute renal failure secondary to dehydration which is now resolved  . Cystocele   . GERD (gastroesophageal reflux disease)   . Headache(784.0)    chronic  . Hyperlipidemia   . Hypertension   . Knee fracture   . Light headedness    due to dehydration  . Mobitz  type 2 second degree atrioventricular block    a. felt to be 2/2 amiodarone, resolved with decreased amiodarone dose  . Persistent atrial fibrillation ([place])    a. status post multiple DCCV, most recently in 07/2013  . PONV (postoperative nausea and vomiting)    oxycodone and codiene cause N/V   . Pre-syncope   . 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 Venous insufficiency   . Vertigo     Past Surgical History:  Procedure Laterality Date  . ABDOMINAL HYSTERECTOMY  1990  . APPENDECTOMY    . AUGMENTATION MAMMAPLASTY Bilateral 1986   implants  . AUGMENTATION MAMMAPLASTY  1990  . AUGMENTATION MAMMAPLASTY  2011  . CARDIAC CATHETERIZATION    . CARDIOVERSION     x 3  . CARDIOVERSION    . CATARACT EXTRACTION W/PHACO Right 09/21/2016   Procedure: CATARACT EXTRACTION PHACO AND INTRAOCULAR LENS PLACEMENT (IOC);  Surgeon: Birder Robson, MD;  Location: [facility] ORS;  Service: Ophthalmology;  Laterality: Right;  [place] 44.1 AP% 16.5 CDE 7.30 Fluid Pack Lot [id number]  . CATARACT EXTRACTION W/PHACO Left 10/19/2016   Procedure: CATARACT EXTRACTION PHACO AND INTRAOCULAR LENS PLACEMENT (IOC);  Surgeon: Birder Robson, MD;  Location: [facility] ORS;  Service: Ophthalmology;  Laterality: Left;  [place] 53.7 AP% 19.5 CDE 10.45 Fluid pack lot # [id number]  . CHOLECYSTECTOMY    . COMBINED AUGMENTATION MAMMAPLASTY AND ABDOMINOPLASTY    . JOINT REPLACEMENT Left 06/04/2013   left knee  . KNEE ARTHROSCOPY Right 08/16/2016   Procedure: ARTHROSCOPY KNEE,  tear posterior horn medial meniscus, tear anterior and posterior horns of lateral meniscus, chondromalacia of lateral compartment grade 3 patella and grade 4 medial;  Surgeon: Dereck Leep, MD;  Location: [facility] ORS;  Service: Orthopedics;  Laterality: Right;  . MASTECTOMY  1986   Bilateral with silicone  breast implants, s/p saline replacements  . Multiple orthopedic procedures    . NOSE SURGERY    . TOTAL KNEE ARTHROPLASTY Left      Current Outpatient  Prescriptions  Medication Sig Dispense Refill  . acetaminophen (TYLENOL) 500 MG tablet Take 1,000 mg by mouth every 6 (six) hours as needed (pain).     [name] aluminum-magnesium hydroxide 200-200 MG/5ML suspension Take 20 mLs by mouth every 6 (six) hours as needed for indigestion.     [name] amiodarone (PACERONE) 200 MG tablet Take 0.5 tablets (100 mg total) by mouth daily. 15 tablet 6  . amLODipine (NORVASC) 2.5 MG tablet Take 1-2 tablets (2.5-5 mg total) by mouth 2 (two) times daily. 2.5 in am and 5mg  in the evening 180 tablet 3  . apixaban (ELIQUIS) 5 MG TABS tablet Take 1 tablet (5 mg total) by mouth 2 (two) times daily. 60 tablet 6  . cetirizine (ZYRTEC) 10 MG tablet Take 10 mg by mouth daily as needed for allergies.    . Cholecalciferol (VITAMIN D3) 1000 UNITS CAPS Take 1,000 Units by mouth daily.     . fluticasone (FLONASE) 50 MCG/ACT nasal spray Place 2 sprays into both nostrils daily. 16 g 0  . furosemide (LASIX) 20 MG tablet Take 1 tablet (20 mg total) by mouth daily. As needed for edema not more than every other day (Patient taking differently: Take 20 mg by mouth every other day as needed. As needed for edema) 45 tablet 1  . HYDROcodone-acetaminophen (NORCO) 5-325 MG tablet Take 1-2 tablets by mouth every 4 (four) hours as needed for moderate pain. 40 tablet 0  . OVER THE COUNTER MEDICATION Take 1 capsule by mouth daily. Probiotic    . pravastatin (PRAVACHOL) 20 MG tablet TAKE ONE (1) TABLET BY MOUTH EVERY DAY 90 tablet 0  . ranitidine (ZANTAC) 150 MG tablet Take 1 tablet (150 mg total) by mouth 2 (two) times daily. 60 tablet 2  . sodium chloride (OCEAN) 0.65 % SOLN nasal spray Place 1 spray into both nostrils 2 (two) times daily as needed for congestion.     . valACYclovir (VALTREX) 1000 MG tablet Take 1 tablet (1,000 mg total) by mouth 2 (two) times daily. 14 tablet 0   No current facility-administered medications for this visit.     Allergies:   Iodine; Biaxin [clarithromycin]; Codeine;  Enalapril; Fluocinonide; Oxycodone; Promethazine; Warfarin and related; and Xarelto [rivaroxaban]    Social History:  The patient  reports that she has never smoked. She has never used smokeless tobacco. She reports that she does not drink alcohol or use drugs.   Family History:  The patient's family history includes Breast cancer in her maternal grandmother, sister, sister, and sister; Cancer in her father, maternal grandmother, sister, and sister; Heart disease in her mother and son.    ROS:  Please see the history of present illness.   Otherwise, review of systems are positive for none.   All other systems are reviewed and negative.    PHYSICAL EXAM: VS:  BP 112/60 (BP Location: Left Arm, Patient Position: Sitting, Cuff Size: Normal)   Pulse 60   Ht 5' 4.5" (1.638 m)   Wt 156 lb 8  oz (71 kg)   BMI 26.45 kg/m  , BMI Body mass index is 26.45 kg/m. GEN: Well nourished, well developed, in no acute distress  HEENT: normal  Neck: no JVD, carotid bruits, or masses Cardiac: RRR; no murmurs, rubs, or gallops,no edema  Respiratory:  clear to auscultation bilaterally, normal work of breathing GI: soft, nontender, nondistended, + BS MS: no deformity or atrophy  Skin: warm and dry, no rash Neuro:  Strength and sensation are intact Psych: euthymic mood, full affect   EKG:  EKG is ordered today. The ekg ordered today demonstrates normal sinus rhythm with right bundle branch block and nonspecific T wave changes.   Recent Labs: 03/29/2016: Hemoglobin 13.2 04/22/2016: Platelets 241 06/28/2016: ALT 14; BUN 25; Creatinine, Ser 1.29; Potassium 4.4; Sodium 140    Lipid Panel    Component Value Date/Time   CHOL 174 04/22/2015 1044   TRIG 82.0 04/22/2015 1044   HDL 61.00 04/22/2015 1044   CHOLHDL 3 04/22/2015 1044   VLDL 16.4 04/22/2015 1044   LDLCALC 97 04/22/2015 1044      Wt Readings from Last 3 Encounters:  12/31/16 156 lb 8 oz (71 kg)  12/22/16 160 lb 8 oz (72.8 kg)  11/25/16 158  lb (71.7 kg)       ASSESSMENT AND PLAN:  1.  Persistent atrial fibrillation: Currently maintaining in sinus rhythm with small dose amiodarone. She is on anticoagulation with Eliquis.  No bleeding complications.  2. GERD: I suspect that her recent chest pain was likely due to this. Nuclear stress test was normal. I added Protonix 40 mg once daily for one month.  2. Essential hypertension: Blood pressure is well controlled on current medications.  3. History of tachycardia-induced cardiomyopathy. Most recent ejection fraction was normal .  4. Chronic venous insufficiency mostly affecting the right leg: continue with support stockings and leg elevation. She uses furosemide as needed.  Disposition:   FU with me in 6 months  Signed,  [name], MD  12/31/2016 2:27 PM    [place]

## 2016-12-31 NOTE — Patient Instructions (Signed)
Medication Instructions:  Your physician has recommended you make the following change in your medication:  START taking protonix 40mg  once daily   Labwork: none  Testing/Procedures: none  Follow-Up: Your physician wants you to follow-up in: 6 months with Dr. Fletcher Anon.  You will receive a reminder letter in the mail two months in advance. If you don't receive a letter, please call our office to schedule the follow-up appointment.   Any Other Special Instructions Will Be Listed Below (If Applicable).     If you need a refill on your cardiac medications before your next appointment, please call your pharmacy.

## 2017-01-10 ENCOUNTER — Encounter: Payer: Self-pay | Admitting: Internal Medicine

## 2017-01-10 ENCOUNTER — Encounter: Payer: Self-pay | Admitting: Family Medicine

## 2017-01-10 ENCOUNTER — Ambulatory Visit: Payer: Medicare Other | Admitting: Family Medicine

## 2017-01-10 ENCOUNTER — Telehealth: Payer: Self-pay | Admitting: *Deleted

## 2017-01-10 ENCOUNTER — Ambulatory Visit (INDEPENDENT_AMBULATORY_CARE_PROVIDER_SITE_OTHER): Payer: Medicare Other | Admitting: Family Medicine

## 2017-01-10 VITALS — BP 120/62 | HR 68 | Temp 98.4°F | Wt 157.6 lb

## 2017-01-10 DIAGNOSIS — R05 Cough: Secondary | ICD-10-CM | POA: Diagnosis not present

## 2017-01-10 DIAGNOSIS — J111 Influenza due to unidentified influenza virus with other respiratory manifestations: Secondary | ICD-10-CM

## 2017-01-10 DIAGNOSIS — R059 Cough, unspecified: Secondary | ICD-10-CM

## 2017-01-10 DIAGNOSIS — K219 Gastro-esophageal reflux disease without esophagitis: Secondary | ICD-10-CM | POA: Diagnosis not present

## 2017-01-10 HISTORY — DX: Influenza due to unidentified influenza virus with other respiratory manifestations: J11.1

## 2017-01-10 LAB — POCT INFLUENZA A/B
Influenza A, POC: POSITIVE — AB
Influenza B, POC: POSITIVE — AB

## 2017-01-10 MED ORDER — OSELTAMIVIR PHOSPHATE 6 MG/ML PO SUSR: 30.0000 mg | Freq: Two times a day (BID) | ORAL | 0 refills | Status: DC

## 2017-01-10 MED ORDER — OSELTAMIVIR PHOSPHATE 30 MG PO CAPS: 30.0000 mg | ORAL_CAPSULE | Freq: Two times a day (BID) | ORAL | 0 refills | Status: DC

## 2017-01-10 NOTE — Telephone Encounter (Signed)
Please advise 

## 2017-01-10 NOTE — Assessment & Plan Note (Signed)
Patient reports burping and some reflux discomfort. Recently had a cardiac evaluation to rule out cardiac cause. They placed her on Protonix which has not helped very much. We will refer to GI at [facility] for further evaluation at patient request.

## 2017-01-10 NOTE — Telephone Encounter (Signed)
[place] stated that the Rx sent over for tamiflu was not available in the generic brand. [facility] requested liquid form

## 2017-01-10 NOTE — Progress Notes (Signed)
Pre visit review using our clinic review tool, if applicable. No additional management support is needed unless otherwise documented below in the visit note. 

## 2017-01-10 NOTE — Telephone Encounter (Signed)
noted 

## 2017-01-10 NOTE — Assessment & Plan Note (Signed)
Patient's symptoms most likely related to influenza given positive influenza test. She has a benign lung exam and is not in any distress. Vital signs are stable. We will start on Tamiflu renally dosed at 30 mg twice daily. She'll stay well hydrated. If not starting to improve over the next 2-3 days she will follow-up in the office. Given return precautions.

## 2017-01-10 NOTE — Patient Instructions (Signed)
Nice to see you. Your flu test was positive. We will treat you with Tamiflu. Please stay well hydrated with this. Please continue your Protonix. We'll refer you to GI for further evaluation. If you're not improving in the next 2-3 days please follow-up in the office. If you develop cough productive of blood, fevers, shortness of breath, or any new or changing symptoms please seek medical attention immediately.

## 2017-01-10 NOTE — Progress Notes (Signed)
  Tommi Rumps, MD Phone: [phone]  [name] [name] is a 81 y.o. female who presents today for same-day visit.  Patient notes 2 days of chest congestion and head congestion with body aches. Some yellow productive cough this morning. Minimal shortness of breath with this though no chest pain, wheezing, chills, sweats, or fever. Husband has been sick and has been treated for a COPD exacerbation.  Patient also notes that she recently underwent a stress test that was low risk. They placed her on Protonix to try to help with reflux which they thought was causing the discomfort she was having previously. She notes Protonix has not helped very much and she does get reflux symptoms at night. She notes no abdominal pain or blood in her stool with this. She would like to consider GI referral.  PMH: nonsmoker.   ROS see history of present illness  Objective  Physical Exam Vitals:   01/10/17 1036  BP: 120/62  Pulse: 68  Temp: 98.4 F (36.9 C)    BP Readings from Last 3 Encounters:  01/10/17 120/62  12/31/16 112/60  12/22/16 118/64   Wt Readings from Last 3 Encounters:  01/10/17 157 lb 9.6 oz (71.5 kg)  12/31/16 156 lb 8 oz (71 kg)  12/22/16 160 lb 8 oz (72.8 kg)    Physical Exam  Constitutional: No distress.  HENT:  Head: Normocephalic and atraumatic.  Mouth/Throat: Oropharynx is clear and moist. No oropharyngeal exudate.  Normal TMs bilaterally  Eyes: Conjunctivae are normal. Pupils are equal, round, and reactive to light.  Cardiovascular: Normal rate, regular rhythm and normal heart sounds.   Pulmonary/Chest: Effort normal and breath sounds normal.  Abdominal: Soft. Bowel sounds are normal. She exhibits no distension. There is no tenderness. There is no rebound and no guarding.  Neurological: She is alert. Gait normal.  Skin: Skin is warm and dry. She is not diaphoretic.     Assessment/Plan: Please see individual problem list.  GERD (gastroesophageal reflux  disease) Patient reports burping and some reflux discomfort. Recently had a cardiac evaluation to rule out cardiac cause. They placed her on Protonix which has not helped very much. We will refer to GI at [facility] for further evaluation at patient request.  Influenza Patient's symptoms most likely related to influenza given positive influenza test. She has a benign lung exam and is not in any distress. Vital signs are stable. We will start on Tamiflu renally dosed at 30 mg twice daily. She'll stay well hydrated. If not starting to improve over the next 2-3 days she will follow-up in the office. Given return precautions.   Orders Placed This Encounter  Procedures  . Ambulatory referral to Gastroenterology    Referral Priority:   Routine    Referral Type:   Consultation    Referral Reason:   Specialty Services Required    Number of Visits Requested:   1  . POCT Influenza A/B    Meds ordered this encounter  Medications  . oseltamivir (TAMIFLU) 30 MG capsule    Sig: Take 1 capsule (30 mg total) by mouth 2 (two) times daily.    Dispense:  10 capsule    Refill:  0    This is renally dosed    Tommi Rumps, MD [place]

## 2017-01-10 NOTE — Telephone Encounter (Signed)
Liquid sent to pharmacy.

## 2017-01-12 ENCOUNTER — Telehealth: Payer: Self-pay | Admitting: Radiology

## 2017-01-12 ENCOUNTER — Other Ambulatory Visit: Payer: Self-pay | Admitting: Internal Medicine

## 2017-01-12 DIAGNOSIS — E559 Vitamin D deficiency, unspecified: Secondary | ICD-10-CM

## 2017-01-12 DIAGNOSIS — E7800 Pure hypercholesterolemia, unspecified: Secondary | ICD-10-CM

## 2017-01-12 DIAGNOSIS — I10 Essential (primary) hypertension: Secondary | ICD-10-CM

## 2017-01-12 DIAGNOSIS — Z7901 Long term (current) use of anticoagulants: Secondary | ICD-10-CM

## 2017-01-12 NOTE — Telephone Encounter (Signed)
Med refilled,  Needs fasting labs  Asap,  Ordered.

## 2017-01-12 NOTE — Telephone Encounter (Signed)
Lm on pt's vm to call office and make a lab appt.

## 2017-01-12 NOTE — Telephone Encounter (Signed)
Pt last refill of Pravachol was on 10/14/16. Last OV was on 11/01/16. Pt has not had recent labs drawn to check cholesterol. Next appt is Medicare Well Visit on 03/18/17. Ok to refill or does pt need to come in for labs?

## 2017-01-12 NOTE — Telephone Encounter (Signed)
Can you schedule pt for lab visit? Thank you.

## 2017-01-13 ENCOUNTER — Other Ambulatory Visit: Payer: Medicare Other

## 2017-01-24 ENCOUNTER — Other Ambulatory Visit (INDEPENDENT_AMBULATORY_CARE_PROVIDER_SITE_OTHER): Payer: Medicare Other

## 2017-01-24 DIAGNOSIS — Z7901 Long term (current) use of anticoagulants: Secondary | ICD-10-CM | POA: Diagnosis not present

## 2017-01-24 DIAGNOSIS — I10 Essential (primary) hypertension: Secondary | ICD-10-CM

## 2017-01-24 DIAGNOSIS — E559 Vitamin D deficiency, unspecified: Secondary | ICD-10-CM

## 2017-01-24 DIAGNOSIS — E7800 Pure hypercholesterolemia, unspecified: Secondary | ICD-10-CM

## 2017-01-24 LAB — COMPREHENSIVE METABOLIC PANEL
ALT: 17 U/L (ref 0–35)
AST: 18 U/L (ref 0–37)
Albumin: 4.3 g/dL (ref 3.5–5.2)
Alkaline Phosphatase: 66 U/L (ref 39–117)
BUN: 16 mg/dL (ref 6–23)
CO2: 29 mEq/L (ref 19–32)
Calcium: 9.3 mg/dL (ref 8.4–10.5)
Chloride: 105 mEq/L (ref 96–112)
Creatinine, Ser: 0.89 mg/dL (ref 0.40–1.20)
GFR: 65.03 mL/min (ref >60.00)
Glucose, Bld: 104 mg/dL — ABNORMAL HIGH (ref 70–99)
Potassium: 4.3 mEq/L (ref 3.5–5.1)
Sodium: 142 mEq/L (ref 135–145)
Total Bilirubin: 0.8 mg/dL (ref 0.2–1.2)
Total Protein: 7 g/dL (ref 6.0–8.3)

## 2017-01-24 LAB — CBC WITH DIFFERENTIAL/PLATELET
Basophils Absolute: 0.1 10*3/uL (ref 0.0–0.1)
Basophils Relative: 0.9 % (ref 0.0–3.0)
Eosinophils Absolute: 0.1 10*3/uL (ref 0.0–0.7)
Eosinophils Relative: 1.4 % (ref 0.0–5.0)
HCT: 39.7 % (ref 36.0–46.0)
Hemoglobin: 13.1 g/dL (ref 12.0–15.0)
Lymphocytes Relative: 19.3 % (ref 12.0–46.0)
Lymphs Abs: 1.1 10*3/uL (ref 0.7–4.0)
MCHC: 33.1 g/dL (ref 30.0–36.0)
MCV: 96.2 fl (ref 78.0–100.0)
Monocytes Absolute: 0.3 10*3/uL (ref 0.1–1.0)
Monocytes Relative: 6.1 % (ref 3.0–12.0)
Neutro Abs: 4 10*3/uL (ref 1.4–7.7)
Neutrophils Relative %: 72.3 % (ref 43.0–77.0)
Platelets: 246 10*3/uL (ref 150.0–400.0)
RBC: 4.13 Mil/uL (ref 3.87–5.11)
RDW: 14.4 % (ref 11.5–15.5)
WBC: 5.5 10*3/uL (ref 4.0–10.5)

## 2017-01-24 LAB — VITAMIN D 25 HYDROXY (VIT D DEFICIENCY, FRACTURES): VITD: 21.9 ng/mL — ABNORMAL LOW (ref 30.00–100.00)

## 2017-01-24 LAB — LIPID PANEL
Cholesterol: 169 mg/dL (ref 0–200)
HDL: 66.6 mg/dL (ref >39.00)
LDL Cholesterol: 85 mg/dL (ref 0–99)
NonHDL: 102.86
Total CHOL/HDL Ratio: 3
Triglycerides: 90 mg/dL (ref 0.0–149.0)
VLDL: 18 mg/dL (ref 0.0–40.0)

## 2017-01-24 LAB — TSH: TSH: 2.72 u[IU]/mL (ref 0.35–4.50)

## 2017-01-26 ENCOUNTER — Other Ambulatory Visit: Payer: Self-pay | Admitting: Internal Medicine

## 2017-01-26 MED ORDER — ERGOCALCIFEROL 1.25 MG (50000 UT) PO CAPS: 50000.0000 [IU] | ORAL_CAPSULE | ORAL | 2 refills | Status: DC

## 2017-02-03 ENCOUNTER — Emergency Department: Payer: Medicare Other

## 2017-02-03 ENCOUNTER — Emergency Department
Admission: EM | Admit: 2017-02-03 | Discharge: 2017-02-03 | Disposition: A | Discharge status: Home / Self Care | Payer: Medicare Other | Attending: Emergency Medicine | Admitting: Emergency Medicine

## 2017-02-03 ENCOUNTER — Telehealth: Payer: Self-pay | Admitting: Cardiovascular Disease

## 2017-02-03 DIAGNOSIS — I129 Hypertensive chronic kidney disease with stage 1 through stage 4 chronic kidney disease, or unspecified chronic kidney disease: Secondary | ICD-10-CM | POA: Insufficient documentation

## 2017-02-03 DIAGNOSIS — I480 Paroxysmal atrial fibrillation: Secondary | ICD-10-CM | POA: Insufficient documentation

## 2017-02-03 DIAGNOSIS — I4891 Unspecified atrial fibrillation: Secondary | ICD-10-CM | POA: Diagnosis not present

## 2017-02-03 DIAGNOSIS — Z79899 Other long term (current) drug therapy: Secondary | ICD-10-CM | POA: Diagnosis not present

## 2017-02-03 DIAGNOSIS — N189 Chronic kidney disease, unspecified: Secondary | ICD-10-CM | POA: Insufficient documentation

## 2017-02-03 DIAGNOSIS — R008 Other abnormalities of heart beat: Secondary | ICD-10-CM | POA: Diagnosis present

## 2017-02-03 LAB — HEPATIC FUNCTION PANEL
ALT: 17 U/L (ref 14–54)
AST: 28 U/L (ref 15–41)
Albumin: 4.3 g/dL (ref 3.5–5.0)
Alkaline Phosphatase: 64 U/L (ref 38–126)
Bilirubin, Direct: 0.1 mg/dL (ref 0.1–0.5)
Indirect Bilirubin: 0.7 mg/dL (ref 0.3–0.9)
Total Bilirubin: 0.8 mg/dL (ref 0.3–1.2)
Total Protein: 7.6 g/dL (ref 6.5–8.1)

## 2017-02-03 LAB — BASIC METABOLIC PANEL
Anion gap: 7 (ref 5–15)
BUN: 20 mg/dL (ref 6–20)
CO2: 26 mmol/L (ref 22–32)
Calcium: 9.2 mg/dL (ref 8.9–10.3)
Chloride: 105 mmol/L (ref 101–111)
Creatinine, Ser: 1 mg/dL (ref 0.44–1.00)
GFR calc Af Amer: >60 mL/min (ref >60)
GFR calc non Af Amer: 52 mL/min — ABNORMAL LOW (ref >60)
Glucose, Bld: 155 mg/dL — ABNORMAL HIGH (ref 65–99)
Potassium: 3.9 mmol/L (ref 3.5–5.1)
Sodium: 138 mmol/L (ref 135–145)

## 2017-02-03 LAB — CBC
HCT: 42.4 % (ref 35.0–47.0)
Hemoglobin: 14 g/dL (ref 12.0–16.0)
MCH: 31.6 pg (ref 26.0–34.0)
MCHC: 33.1 g/dL (ref 32.0–36.0)
MCV: 95.5 fL (ref 80.0–100.0)
Platelets: 236 10*3/uL (ref 150–440)
RBC: 4.45 MIL/uL (ref 3.80–5.20)
RDW: 14 % (ref 11.5–14.5)
WBC: 5.3 10*3/uL (ref 3.6–11.0)

## 2017-02-03 LAB — TROPONIN I: Troponin I: <0.03 ng/mL (ref <0.03)

## 2017-02-03 LAB — PROTIME-INR
INR: 1.16
Prothrombin Time: 14.9 seconds (ref 11.4–15.2)

## 2017-02-03 MED ORDER — SODIUM CHLORIDE 0.9 % IV BOLUS (SEPSIS)
1000.0000 mL | Freq: Once | INTRAVENOUS | Status: AC
  Administered 2017-02-03: 1000 mL via INTRAVENOUS

## 2017-02-03 MED ORDER — ASPIRIN 81 MG PO CHEW
162.0000 mg | CHEWABLE_TABLET | Freq: Once | ORAL | Status: AC
  Administered 2017-02-03: 162 mg via ORAL
  Filled 2017-02-03: qty 2

## 2017-02-03 NOTE — ED Provider Notes (Signed)
[facility] Emergency Department Provider Note   ____________________________________________   First MD Initiated Contact with Patient 02/03/17 0715     (approximate)  I have reviewed the triage vital signs and the nursing notes.   HISTORY  Chief Complaint Irregular Heart Beat  HPI [name] [name] is a 81 y.o. female who self presents to the ED with her husband for palpitations that began this morning.  She has a long history of intermittent atrial fibrillation and is managed with daily amiodarone and eliquis.  Her Cardiologist is Dr. Fletcher Anon.  Yesterday she began to feel fatigued with some mild nonradiating epigastric discomfort.  Pain was constant and nonexertional.  Nothing made it better or worse.  She has had no chest pain.  She has had two clean caths in the past and most recently had a stress test which was normal.  This morning she felt palpitations, checked her HR and noted it was 116 so she came to the Emergency Department.  A few weeks ago she was diagnosed with influenza and completed a course of tamiflu.  No current infectious symptoms.    Past Medical History:  Diagnosis Date  . Arthritis   . Atrial flutter ([place]) 02/2011   s/p cardioversion   . Cancer (HCC)    BREAST  . Chronic kidney disease    acute renal failure secondary to dehydration which is now resolved  . Cystocele   . GERD (gastroesophageal reflux disease)   . Headache(784.0)    chronic  . Hyperlipidemia   . Hypertension   . Knee fracture   . Light headedness    due to dehydration  . Mobitz type 2 second degree atrioventricular block    a. felt to be 2/2 amiodarone, resolved with decreased amiodarone dose  . Persistent atrial fibrillation ([place])    a. status post multiple DCCV, most recently in 07/2013  . PONV (postoperative nausea and vomiting)    oxycodone and codiene cause N/V   . Pre-syncope   . Tachycardia induced cardiomyopathy ([place])   . Venous insufficiency   .  Vertigo     Patient Active Problem List   Diagnosis Date Noted  . Influenza 01/10/2017  . Rash and nonspecific skin eruption 11/25/2016  . Concussion with no loss of consciousness 09/01/2016  . Chronic venous insufficiency 09/01/2016  . Preoperative clearance 06/29/2016  . Other fatigue 06/29/2016  . Visit for preventive health examination 12/15/2015  . GERD (gastroesophageal reflux disease) 11/20/2015  . Cystocele 11/18/2015  . Unstable bladder 11/18/2015  . Vaginal atrophy 11/18/2015  . Chronic suprapubic pain 10/14/2015  . Inguinal hernia 10/13/2015  . Dysuria 09/12/2015  . Impaired fasting glucose 04/24/2015  . Pulmonary hypertension, moderate to severe 04/24/2015  . Arthritis of knee, degenerative 02/21/2015  . Vitamin D deficiency 12/27/2014  . S/P TAH-BSO 12/13/2014  . S/P bilateral mastectomy 12/13/2014  . Long term current use of anticoagulant therapy 09/25/2014  . HH (hiatus hernia) 04/20/2014  . Tachycardia induced cardiomyopathy ([place]) 07/20/2013  . Unspecified vitamin D deficiency 04/24/2013  . Family history of breast cancer in female 04/23/2013  . Medicare annual wellness visit, subsequent 04/23/2013  . Obesity 06/30/2012  . History of [place] spotted fever 06/22/2012  . Persistent atrial fibrillation ([place]) 05/09/2012  . Anxiety and depression 05/09/2012  . TACHYCARDIA 02/01/2011  . Hyperlipidemia 07/08/2010  . Essential hypertension 03/05/2009    Past Surgical History:  Procedure Laterality Date  . ABDOMINAL HYSTERECTOMY  1990  . APPENDECTOMY    .  AUGMENTATION MAMMAPLASTY Bilateral 1986   implants  . AUGMENTATION MAMMAPLASTY  1990  . AUGMENTATION MAMMAPLASTY  2011  . CARDIAC CATHETERIZATION    . CARDIOVERSION     x 3  . CARDIOVERSION    . CATARACT EXTRACTION W/PHACO Right 09/21/2016   Procedure: CATARACT EXTRACTION PHACO AND INTRAOCULAR LENS PLACEMENT (IOC);  Surgeon: Birder Robson, MD;  Location: [facility] ORS;  Service: Ophthalmology;   Laterality: Right;  [place] 44.1 AP% 16.5 CDE 7.30 Fluid Pack Lot [id number]  . CATARACT EXTRACTION W/PHACO Left 10/19/2016   Procedure: CATARACT EXTRACTION PHACO AND INTRAOCULAR LENS PLACEMENT (IOC);  Surgeon: Birder Robson, MD;  Location: [facility] ORS;  Service: Ophthalmology;  Laterality: Left;  [place] 53.7 AP% 19.5 CDE 10.45 Fluid pack lot # [id number]  . CHOLECYSTECTOMY    . COMBINED AUGMENTATION MAMMAPLASTY AND ABDOMINOPLASTY    . JOINT REPLACEMENT Left 06/04/2013   left knee  . KNEE ARTHROSCOPY Right 08/16/2016   Procedure: ARTHROSCOPY KNEE, tear posterior horn medial meniscus, tear anterior and posterior horns of lateral meniscus, chondromalacia of lateral compartment grade 3 patella and grade 4 medial;  Surgeon: Dereck Leep, MD;  Location: [facility] ORS;  Service: Orthopedics;  Laterality: Right;  . MASTECTOMY  1986   Bilateral with silicone  breast implants, s/p saline replacements  . Multiple orthopedic procedures    . NOSE SURGERY    . TOTAL KNEE ARTHROPLASTY Left     Prior to Admission medications   Medication Sig Start Date End Date Taking? Authorizing Provider  acetaminophen (TYLENOL) 500 MG tablet Take 1,000 mg by mouth every 6 (six) hours as needed (pain).    Yes Historical Provider, MD  aluminum-magnesium hydroxide 200-200 MG/5ML suspension Take 20 mLs by mouth every 6 (six) hours as needed for indigestion.    Yes Historical Provider, MD  amLODipine (NORVASC) 2.5 MG tablet Take 1-2 tablets (2.5-5 mg total) by mouth 2 (two) times daily. 2.5 in am and 5mg  in the evening 12/27/16  Yes Wellington Hampshire, MD  apixaban (ELIQUIS) 5 MG TABS tablet Take 1 tablet (5 mg total) by mouth 2 (two) times daily. 01/30/16  Yes Wellington Hampshire, MD  cetirizine (ZYRTEC) 10 MG tablet Take 10 mg by mouth daily as needed for allergies.   Yes Historical Provider, MD  ergocalciferol (DRISDOL) 50000 units capsule Take 1 capsule (50,000 Units total) by mouth once a week. 01/26/17  Yes [name], MD  fluticasone  (FLONASE) 50 MCG/ACT nasal spray Place 2 sprays into both nostrils daily. 12/29/15  Yes Rubbie Battiest, NP  HYDROcodone-acetaminophen (NORCO) 5-325 MG tablet Take 1-2 tablets by mouth every 4 (four) hours as needed for moderate pain. 08/16/16  Yes Dereck Leep, MD  pantoprazole (PROTONIX) 40 MG tablet Take 1 tablet (40 mg total) by mouth daily. 12/31/16  Yes Wellington Hampshire, MD  pravastatin (PRAVACHOL) 20 MG tablet TAKE ONE (1) TABLET BY MOUTH EVERY DAY 01/12/17  Yes [name], MD  Probiotic Product (PROBIOTIC ADVANCED PO) Take 1 tablet by mouth daily.   Yes Historical Provider, MD  ranitidine (ZANTAC) 150 MG tablet Take 150 mg by mouth 2 (two) times daily.   Yes Historical Provider, MD  sodium chloride (OCEAN) 0.65 % SOLN nasal spray Place 1 spray into both nostrils 2 (two) times daily as needed for congestion.    Yes Historical Provider, MD  amiodarone (PACERONE) 200 MG tablet Take 1 tablet (200 mg total) by mouth 2 (two) times daily. 02/04/17   Wellington Hampshire, MD  furosemide (LASIX) 20 MG tablet Take 1 tablet (20 mg total) by mouth daily. As needed for edema not more than every other day 08/31/16   [name], MD    Allergies Iodine; Biaxin [clarithromycin]; Codeine; Enalapril; Fluocinonide; Oxycodone; Promethazine; Warfarin and related; and Xarelto [rivaroxaban]  Family History  Problem Relation Age of Onset  . Heart disease Mother   . Cancer Father     stomach  . Heart disease Son     found at autopsy  . Cancer Sister     breast  . Breast cancer Sister   . Cancer Maternal Grandmother     breast  . Breast cancer Maternal Grandmother   . Cancer Sister     breast  . Breast cancer Sister   . Breast cancer Sister   . Diabetes Neg Hx     Social History Social History  Substance Use Topics  . Smoking status: Never Smoker  . Smokeless tobacco: Never Used  . Alcohol use No    Review of Systems Constitutional: No fever/chills Eyes: No visual changes. ENT: No sore  throat. Cardiovascular: Denies chest pain.  Reports palpitations Respiratory: Denies shortness of breath. Gastrointestinal: Abdominal pain.  No nausea. Genitourinary: Negative for dysuria. Musculoskeletal: Negative for back pain. Skin: Negative for rash. Neurological: Negative for headaches, focal weakness or numbness. 10-point ROS otherwise negative.  ____________________________________________   PHYSICAL EXAM:  VITAL SIGNS: ED Triage Vitals  Enc Vitals Group     BP --      Pulse Rate 02/03/17 0705 (!) 110     Resp 02/03/17 0705 18     Temp 02/03/17 0705 97.8 F (36.6 C)     Temp Source 02/03/17 0705 Oral     SpO2 02/03/17 0705 98 %     Weight 02/03/17 0704 156 lb (70.8 kg)     Height 02/03/17 0704 5\' 4"  (1.626 m)     Head Circumference --      Peak Flow --      Pain Score --      Pain Loc --      Pain Edu? --      Excl. in [place]? --     Constitutional: Alert and oriented. Well appearing and in no acute distress. Eyes: Conjunctivae are normal. PERRL. EOMI. Head: Atraumatic. Nose: No congestion/rhinnorhea. Mouth/Throat: Mucous membranes are moist.  Oropharynx non-erythematous. Neck: No stridor.   Cardiovascular: Tachycardic regular rhythm no murmurs Respiratory: Normal respiratory effort.  No retractions. Lungs CTAB. Gastrointestinal: Soft and nontender. No distention. No abdominal bruits. No CVA tenderness. Musculoskeletal: No lower extremity tenderness nor edema.  No joint effusions.  Calves equal in size Neurologic:  Normal speech and language. No gross focal neurologic deficits are appreciated. No gait instability. Skin:  Skin is warm, dry and intact. No rash noted. Psychiatric: Mood and affect are normal. Speech and behavior are normal.  ____________________________________________   LABS (all labs ordered are listed, but only abnormal results are displayed)  Labs Reviewed  BASIC METABOLIC PANEL - Abnormal; Notable for the following:       Result Value    Glucose, Bld 155 (*)    GFR calc non Af Amer 52 (*)    All other components within normal limits  CBC  TROPONIN I  PROTIME-INR  HEPATIC FUNCTION PANEL   ____________________________________________  EKG  EKG reviewed by me at 0800: Atrial fibrillation to 107 with RBBB unchanged from previous.  No acute ischemic changes.   ____________________________________________  RADIOLOGY  No  acute disease ____________________________________________   PROCEDURES  Procedure(s) performed: None  Procedures  Critical Care performed: No  ____________________________________________   INITIAL IMPRESSION / ASSESSMENT AND PLAN / ED COURSE  Pertinent labs & imaging results that were available during my care of the patient were reviewed by me and considered in my medical decision making (see chart for details).  The patient arrives very well-appearing and comfortable although if irregularly irregular heartbeat slightly tachycardic  Clinical Course as of Feb 04 1357  Thu Feb 03, 2017  0804 Troponin I: <0.03 [NR]  0904 Dr. Rockey Situ paged  [NR]    Clinical Course User Index [NR] Darel Hong, MD     ____________________________________________   FINAL CLINICAL IMPRESSION(S) / ED DIAGNOSES  Final diagnoses:  Paroxysmal atrial fibrillation ([place])    I spoke with Dr. Rockey Situ on call for Dr. Fletcher Anon who recommends the patient take 200mg  of amiodarone whenever she becomes symptomatic.  NEW MEDICATIONS STARTED DURING THIS VISIT:  Discharge Medication List as of 02/03/2017  9:28 AM       Note:  This document was prepared using Dragon voice recognition software and may include unintentional dictation errors.    Darel Hong, MD 02/04/17 1400

## 2017-02-03 NOTE — Telephone Encounter (Signed)
Pt states she was in the ED this morning, states her heart was out of rhythm. States when she arrived home , she still felt like her heart was "flip flopping". Please call, pt also has a question regarding her amiodarone. Pt made appt for 2/27 with DR. Fletcher Anon

## 2017-02-03 NOTE — ED Triage Notes (Signed)
Pt to room 17 via w/c with no distress noted; st hx afib; st not feeling well since last night, noted HR irreg; reports dizziness this am; has noted some "indigestion" but no CP, no SHOB

## 2017-02-03 NOTE — Telephone Encounter (Signed)
S/w patient. She was concerned about not having enough amiodarone if she needs to take an extra 200 mg because she only gets 15 tablets at a time.  She was advised today in the ED to take an extra 200 mg amiodarone if she has symptoms. She has enough to equal 11 doses of 100 mg of amiodarone. She will contact her pharmacy to see if she can go ahead and get a refill so that it will last her until her appt with Dr Fletcher Anon on 02/15/17. Patient will let us know if she needs [place] to send in a refill.

## 2017-02-03 NOTE — ED Notes (Signed)
Assisted patient to ambulate to bathroom. Patient has steady gait and able to walk independently. NAD noted at this time. Will continue to monitor.

## 2017-02-03 NOTE — Discharge Instructions (Signed)
Fortunately today your blood work was normal and your atrial fibrillation converted back to normal sinus.  I spoke with Dr. Rockey Situ on call for Dr. Fletcher Anon who recommended you take a full 200mg  amiodarone tablet next time this happens and wait a few hours and you can take another 200mg  if you still feel symptomatic.  Please followup with Dr. Fletcher Anon sooner than July should your symptoms become more frequent.  Return to the ED for any new or worsening symptoms such as chest pain, shortness of breath, palpitations, or for any other concerns.  It has been a pleasure caring for you today, if you have any other questions please don't hesitate to have the nurse get me.  Darel Hong, MD

## 2017-02-04 ENCOUNTER — Encounter: Payer: Self-pay | Admitting: Cardiovascular Disease

## 2017-02-04 ENCOUNTER — Ambulatory Visit (INDEPENDENT_AMBULATORY_CARE_PROVIDER_SITE_OTHER): Payer: Medicare Other | Admitting: Cardiovascular Disease

## 2017-02-04 VITALS — BP 120/70 | HR 104 | Ht 64.5 in | Wt 159.2 lb

## 2017-02-04 DIAGNOSIS — I481 Persistent atrial fibrillation: Secondary | ICD-10-CM | POA: Diagnosis not present

## 2017-02-04 DIAGNOSIS — I4819 Other persistent atrial fibrillation: Secondary | ICD-10-CM

## 2017-02-04 DIAGNOSIS — I10 Essential (primary) hypertension: Secondary | ICD-10-CM | POA: Diagnosis not present

## 2017-02-04 MED ORDER — AMIODARONE HCL 200 MG PO TABS: 200.0000 mg | ORAL_TABLET | Freq: Two times a day (BID) | ORAL | 6 refills | Status: DC

## 2017-02-04 NOTE — Progress Notes (Signed)
Cardiology Office Note   Date:  02/04/2017   ID:  [name] [name], DOB [birth year], MRN [name]  PCP:  [name], MD  Cardiologist:   [name], MD   Chief Complaint  Patient presents with  . other    follow up from [facility] ER; A-fib. Meds reviewed by the pt. verbally. Pt. c/o still feeling like in A-Fib, weakness with pain across her shoulders.       History of Present Illness: [name] [name] is a 81 y.o. female who presents for a followup visit regarding persistent atrial fibrillation.   She has known history of persistent atrial fibrillation/flutter status post multiple cardioversions in the past. 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 She had cardiac catheterization twice in the past without significant coronary artery disease.  Most recent cardioversion was in August of 2014. She did have bradycardia post cardioversion. She has known history of labile hypertension. Most recent Echocardiogram in November 2016 showed normal LV systolic function with mildly dilated left atrium, mild mitral regurgitation, and mild to moderate pulmonary hypertension.  She had intermittent Mobitz 2 AV block which resolved after decreasing the dose of amiodarone to 100 mg once daily.  She is on long-term anticoagulation with Eliquis. Most recent cardioversion was in August 2014. She was seen last month and was noted to be in sinus rhythm at that time. She started having palpitations on Wednesday with associated shortness of breath and fatigue. This is very typical when she goes into atrial fibrillation. She went to the emergency room was noted to be in atrial fibrillation but ventricular rate was not extremely high. Heart rate was ranging from 110-115 bpm. Her labs were unremarkable. The patient was discharged and asked to follow-up with [place]. She continues to feel poorly.    Past Medical History:  Diagnosis Date  . Arthritis   . Atrial flutter ([place])  02/2011   s/p cardioversion   . Cancer (HCC)    BREAST  . Chronic kidney disease    acute renal failure secondary to dehydration which is now resolved  . Cystocele   . GERD (gastroesophageal reflux disease)   . Headache(784.0)    chronic  . Hyperlipidemia   . Hypertension   . Knee fracture   . Light headedness    due to dehydration  . Mobitz type 2 second degree atrioventricular block    a. felt to be 2/2 amiodarone, resolved with decreased amiodarone dose  . Persistent atrial fibrillation ([place])    a. status post multiple DCCV, most recently in 07/2013  . PONV (postoperative nausea and vomiting)    oxycodone and codiene cause N/V   . Pre-syncope   . 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 Venous insufficiency   . Vertigo     Past Surgical History:  Procedure Laterality Date  . ABDOMINAL HYSTERECTOMY  1990  . APPENDECTOMY    . AUGMENTATION MAMMAPLASTY Bilateral 1986   implants  . AUGMENTATION MAMMAPLASTY  1990  . AUGMENTATION MAMMAPLASTY  2011  . CARDIAC CATHETERIZATION    . CARDIOVERSION     x 3  . CARDIOVERSION    . CATARACT EXTRACTION W/PHACO Right 09/21/2016   Procedure: CATARACT EXTRACTION PHACO AND INTRAOCULAR LENS PLACEMENT (IOC);  Surgeon: Birder Robson, MD;  Location: [facility] ORS;  Service: Ophthalmology;  Laterality: Right;  [place] 44.1 AP% 16.5 CDE 7.30 Fluid Pack Lot [id number]  . CATARACT EXTRACTION W/PHACO Left 10/19/2016   Procedure: CATARACT EXTRACTION PHACO AND INTRAOCULAR  LENS PLACEMENT (IOC);  Surgeon: Birder Robson, MD;  Location: [facility] ORS;  Service: Ophthalmology;  Laterality: Left;  [place] 53.7 AP% 19.5 CDE 10.45 Fluid pack lot # [id number]  . CHOLECYSTECTOMY    . COMBINED AUGMENTATION MAMMAPLASTY AND ABDOMINOPLASTY    . JOINT REPLACEMENT Left 06/04/2013   left knee  . KNEE ARTHROSCOPY Right 08/16/2016   Procedure: ARTHROSCOPY KNEE, tear posterior horn medial meniscus, tear anterior and posterior horns of lateral meniscus, chondromalacia of lateral  compartment grade 3 patella and grade 4 medial;  Surgeon: Dereck Leep, MD;  Location: [facility] ORS;  Service: Orthopedics;  Laterality: Right;  . MASTECTOMY  1986   Bilateral with silicone  breast implants, s/p saline replacements  . Multiple orthopedic procedures    . NOSE SURGERY    . TOTAL KNEE ARTHROPLASTY Left      Current Outpatient Prescriptions  Medication Sig Dispense Refill  . acetaminophen (TYLENOL) 500 MG tablet Take 1,000 mg by mouth every 6 (six) hours as needed (pain).     [name] aluminum-magnesium hydroxide 200-200 MG/5ML suspension Take 20 mLs by mouth every 6 (six) hours as needed for indigestion.     [name] amiodarone (PACERONE) 200 MG tablet Take 0.5 tablets (100 mg total) by mouth daily. 15 tablet 6  . amLODipine (NORVASC) 2.5 MG tablet Take 1-2 tablets (2.5-5 mg total) by mouth 2 (two) times daily. 2.5 in am and 5mg  in the evening 180 tablet 3  . apixaban (ELIQUIS) 5 MG TABS tablet Take 1 tablet (5 mg total) by mouth 2 (two) times daily. 60 tablet 6  . cetirizine (ZYRTEC) 10 MG tablet Take 10 mg by mouth daily as needed for allergies.    [name] ergocalciferol (DRISDOL) 50000 units capsule Take 1 capsule (50,000 Units total) by mouth once a week. 4 capsule 2  . fluticasone (FLONASE) 50 MCG/ACT nasal spray Place 2 sprays into both nostrils daily. 16 g 0  . furosemide (LASIX) 20 MG tablet Take 1 tablet (20 mg total) by mouth daily. As needed for edema not more than every other day 45 tablet 1  . HYDROcodone-acetaminophen (NORCO) 5-325 MG tablet Take 1-2 tablets by mouth every 4 (four) hours as needed for moderate pain. 40 tablet 0  . pantoprazole (PROTONIX) 40 MG tablet Take 1 tablet (40 mg total) by mouth daily. 30 tablet 11  . pravastatin (PRAVACHOL) 20 MG tablet TAKE ONE (1) TABLET BY MOUTH EVERY DAY 90 tablet 0  . Probiotic Product (PROBIOTIC ADVANCED PO) Take 1 tablet by mouth daily.    . ranitidine (ZANTAC) 150 MG tablet Take 150 mg by mouth 2 (two) times daily.    . sodium chloride  (OCEAN) 0.65 % SOLN nasal spray Place 1 spray into both nostrils 2 (two) times daily as needed for congestion.      No current facility-administered medications for this visit.     Allergies:   Iodine; Biaxin [clarithromycin]; Codeine; Enalapril; Fluocinonide; Oxycodone; Promethazine; Warfarin and related; and Xarelto [rivaroxaban]    Social History:  The patient  reports that she has never smoked. She has never used smokeless tobacco. She reports that she does not drink alcohol or use drugs.   Family History:  The patient's family history includes Breast cancer in her maternal grandmother, sister, sister, and sister; Cancer in her father, maternal grandmother, sister, and sister; Heart disease in her mother and son.    ROS:  Please see the history of present illness.   Otherwise, review of systems are positive for none.  All other systems are reviewed and negative.    PHYSICAL EXAM: VS:  BP 120/70 (BP Location: Left Arm, Patient Position: Sitting, Cuff Size: Normal)   Pulse (!) 104   Ht 5' 4.5" (1.638 m)   Wt 159 lb 4 oz (72.2 kg)   BMI 26.91 kg/m  , BMI Body mass index is 26.91 kg/m. GEN: Well nourished, well developed, in no acute distress  HEENT: normal  Neck: no JVD, carotid bruits, or masses Cardiac: RRR; no murmurs, rubs, or gallops,no edema  Respiratory:  clear to auscultation bilaterally, normal work of breathing GI: soft, nontender, nondistended, + BS MS: no deformity or atrophy  Skin: warm and dry, no rash Neuro:  Strength and sensation are intact Psych: euthymic mood, full affect   EKG:  EKG is ordered today. The ekg ordered today demonstrates atrial fibrillation with rapid ventricular response. Ventricular rate is 104 bpm. Right bundle branch block with T wave changes suggestive of anterolateral ischemia.  Recent Labs: 01/24/2017: TSH 2.72 02/03/2017: ALT 17; BUN 20; Creatinine, Ser 1.00; Hemoglobin 14.0; Platelets 236; Potassium 3.9; Sodium 138    Lipid Panel     Component Value Date/Time   CHOL 169 01/24/2017 0823   TRIG 90.0 01/24/2017 0823   HDL 66.60 01/24/2017 0823   CHOLHDL 3 01/24/2017 0823   VLDL 18.0 01/24/2017 0823   LDLCALC 85 01/24/2017 0823      Wt Readings from Last 3 Encounters:  02/04/17 159 lb 4 oz (72.2 kg)  02/03/17 156 lb (70.8 kg)  01/10/17 157 lb 9.6 oz (71.5 kg)       ASSESSMENT AND PLAN:  1.  Persistent atrial fibrillation: The patient is currently in A. fib with RVR and she is very symptomatic. She is on anticoagulation with Eliquis and has not missed any doses.  I think the best option is to proceed with urgent cardioversion given that she is very symptomatic when she goes into A. Fib. We scheduled the procedure on Monday. In the meanwhile, I asked her to increase amiodarone to 200 mg twice daily. The dose will be decreased after cardioversion.  2. Essential hypertension: Blood pressure is well controlled on current medications.  3. History of tachycardia-induced cardiomyopathy. Most recent ejection fraction was normal .  4. Chronic venous insufficiency mostly affecting the right leg: continue with support stockings and leg elevation. She uses furosemide as needed.  Disposition:   FU with me in 1 months  Signed,  [name], MD  02/04/2017 11:46 AM    [place]

## 2017-02-04 NOTE — Telephone Encounter (Signed)
Pt states she is reall weak and "sore across her chest and shoulders". States her BP is 115/73, HR 119. Pt states the amiodarone is not helping.

## 2017-02-04 NOTE — Patient Instructions (Addendum)
Medication Instructions:  Your physician has recommended you make the following change in your medication:  INCREASE amiodarone to 200mg  twice daily   Labwork: none  Testing/Procedures: Your physician has recommended that you have a Cardioversion (DCCV). Electrical Cardioversion uses a jolt of electricity to your heart either through paddles or wired patches attached to your chest. This is a controlled, usually prescheduled, procedure. Defibrillation is done under light anesthesia in the hospital, and you usually go home the day of the procedure. This is done to get your heart back into a normal rhythm. You are not awake for the procedure. Please see the instruction sheet given to you today.  Monday, February 19 Arrival time  6:30am [name] [name] Nothing to eat or drink after midnight Sunday.  You may take your morning medications with a sip of water. YOU SHOULD TAKE YOUR ELIQUIS Monday morning!   Follow-Up: Your physician recommends that you schedule a follow-up appointment in 1-2 weeks with Dr. Fletcher Anon.    Any Other Special Instructions Will Be Listed Below (If Applicable).     If you need a refill on your cardiac medications before your next appointment, please call your pharmacy.   Electrical Cardioversion Electrical cardioversion is the delivery of a jolt of electricity to restore a normal rhythm to the heart. A rhythm that is too fast or is not regular keeps the heart from pumping well. In this procedure, sticky patches or metal paddles are placed on the chest to deliver electricity to the heart from a device. This procedure may be done in an emergency if:  There is low or no blood pressure as a result of the heart rhythm.  Normal rhythm must be restored as fast as possible to protect the brain and heart from further damage.  It may save a life. This procedure may also be done for irregular or fast heart rhythms that are not immediately life-threatening. Tell a health  care provider about:  Any allergies you have.  All medicines you are taking, including vitamins, herbs, eye drops, creams, and over-the-counter medicines.  Any problems you or family members have had with anesthetic medicines.  Any blood disorders you have.  Any surgeries you have had.  Any medical conditions you have.  Whether you are pregnant or may be pregnant. What are the risks? Generally, this is a safe procedure. However, problems may occur, including:  Allergic reactions to medicines.  A blood clot that breaks free and travels to other parts of your body.  The possible return of an abnormal heart rhythm within hours or days after the procedure.  Your heart stopping (cardiac arrest). This is rare. What happens before the procedure? Medicines  Your health care provider may have you start taking:  Blood-thinning medicines (anticoagulants) so your blood does not clot as easily.  Medicines may be given to help stabilize your heart rate and rhythm.  Ask your health care provider about changing or stopping your regular medicines. This is especially important if you are taking diabetes medicines or blood thinners. General instructions  Plan to have someone take you home from the hospital or clinic.  If you will be going home right after the procedure, plan to have someone with you for 24 hours.  Follow instructions from your health care provider about eating or drinking restrictions. What happens during the procedure?  To lower your risk of infection:  Your health care team will wash or sanitize their hands.  Your skin will be washed with soap.  An IV tube will be inserted into one of your veins.  You will be given a medicine to help you relax (sedative).  Sticky patches (electrodes) or metal paddles may be placed on your chest.  An electrical shock will be delivered. The procedure may vary among health care providers and hospitals. What happens after the  procedure?  Your blood pressure, heart rate, breathing rate, and blood oxygen level will be monitored until the medicines you were given have worn off.  Do not drive for 24 hours if you were given a sedative.  Your heart rhythm will be watched to make sure it does not change. This information is not intended to replace advice given to you by your health care provider. Make sure you discuss any questions you have with your health care provider. Document Released: 11/26/2002 Document Revised: 08/04/2016 Document Reviewed: 06/11/2016 Elsevier Interactive Patient Education  2017 [employer].  Electrical Cardioversion, Care After This sheet gives you information about how to care for yourself after your procedure. Your health care provider may also give you more specific instructions. If you have problems or questions, contact your health care provider. What can I expect after the procedure? After the procedure, it is common to have:  Some redness on the skin where the shocks were given. Follow these instructions at home:  Do not drive for 24 hours if you were given a medicine to help you relax (sedative).  Take over-the-counter and prescription medicines only as told by your health care provider.  Ask your health care provider how to check your pulse. Check it often.  Rest for 48 hours after the procedure or as told by your health care provider.  Avoid or limit your caffeine use as told by your health care provider. Contact a health care provider if:  You feel like your heart is beating too quickly or your pulse is not regular.  You have a serious muscle cramp that does not go away. Get help right away if:  You have discomfort in your chest.  You are dizzy or you feel faint.  You have trouble breathing or you are short of breath.  Your speech is slurred.  You have trouble moving an arm or leg on one side of your body.  Your fingers or toes turn cold or blue. This  information is not intended to replace advice given to you by your health care provider. Make sure you discuss any questions you have with your health care provider. Document Released: 09/26/2013 Document Revised: 07/09/2016 Document Reviewed: 06/11/2016 Elsevier Interactive Patient Education  2017 [employer].

## 2017-02-04 NOTE — Telephone Encounter (Signed)
Patient coming in to see Dr. Fletcher Anon today.

## 2017-02-07 ENCOUNTER — Ambulatory Visit: Payer: Medicare Other | Admitting: Certified Registered"

## 2017-02-07 ENCOUNTER — Ambulatory Visit
Admission: RE | Admit: 2017-02-07 | Discharge: 2017-02-07 | Disposition: A | Discharge status: Home / Self Care | Payer: Medicare Other | Source: Ambulatory Visit | Attending: Cardiovascular Disease | Admitting: Cardiovascular Disease

## 2017-02-07 ENCOUNTER — Encounter
Admission: RE | Disposition: A | Discharge status: Home / Self Care | Payer: Self-pay | Source: Ambulatory Visit | Attending: Cardiovascular Disease

## 2017-02-07 DIAGNOSIS — G709 Myoneural disorder, unspecified: Secondary | ICD-10-CM | POA: Diagnosis not present

## 2017-02-07 DIAGNOSIS — M1990 Unspecified osteoarthritis, unspecified site: Secondary | ICD-10-CM | POA: Insufficient documentation

## 2017-02-07 DIAGNOSIS — I4891 Unspecified atrial fibrillation: Secondary | ICD-10-CM | POA: Diagnosis not present

## 2017-02-07 DIAGNOSIS — K219 Gastro-esophageal reflux disease without esophagitis: Secondary | ICD-10-CM | POA: Insufficient documentation

## 2017-02-07 DIAGNOSIS — I10 Essential (primary) hypertension: Secondary | ICD-10-CM | POA: Insufficient documentation

## 2017-02-07 DIAGNOSIS — I481 Persistent atrial fibrillation: Secondary | ICD-10-CM

## 2017-02-07 DIAGNOSIS — I429 Cardiomyopathy, unspecified: Secondary | ICD-10-CM | POA: Insufficient documentation

## 2017-02-07 DIAGNOSIS — E785 Hyperlipidemia, unspecified: Secondary | ICD-10-CM | POA: Insufficient documentation

## 2017-02-07 DIAGNOSIS — K449 Diaphragmatic hernia without obstruction or gangrene: Secondary | ICD-10-CM | POA: Diagnosis not present

## 2017-02-07 DIAGNOSIS — R51 Headache: Secondary | ICD-10-CM | POA: Diagnosis not present

## 2017-02-07 DIAGNOSIS — I441 Atrioventricular block, second degree: Secondary | ICD-10-CM | POA: Diagnosis not present

## 2017-02-07 DIAGNOSIS — Z79899 Other long term (current) drug therapy: Secondary | ICD-10-CM | POA: Insufficient documentation

## 2017-02-07 DIAGNOSIS — I739 Peripheral vascular disease, unspecified: Secondary | ICD-10-CM | POA: Insufficient documentation

## 2017-02-07 DIAGNOSIS — I872 Venous insufficiency (chronic) (peripheral): Secondary | ICD-10-CM | POA: Diagnosis not present

## 2017-02-07 DIAGNOSIS — I4892 Unspecified atrial flutter: Secondary | ICD-10-CM | POA: Insufficient documentation

## 2017-02-07 HISTORY — PX: CARDIOVERSION: EP1203

## 2017-02-07 SURGERY — CARDIOVERSION (CATH LAB)
Anesthesia: General

## 2017-02-07 MED ORDER — PROPOFOL 10 MG/ML IV BOLUS
INTRAVENOUS | Status: AC
  Filled 2017-02-07: qty 20

## 2017-02-07 MED ORDER — ONDANSETRON HCL 4 MG/2ML IJ SOLN: 4.0000 mg | Freq: Once | INTRAMUSCULAR | Status: DC | PRN

## 2017-02-07 MED ORDER — PROPOFOL 10 MG/ML IV BOLUS
INTRAVENOUS | Status: DC | PRN
  Administered 2017-02-07: 40 mg via INTRAVENOUS
  Administered 2017-02-07 (×2): 10 mg via INTRAVENOUS

## 2017-02-07 MED ORDER — EPHEDRINE 5 MG/ML INJ
INTRAVENOUS | Status: AC
  Filled 2017-02-07: qty 10

## 2017-02-07 MED ORDER — FENTANYL CITRATE (PF) 100 MCG/2ML IJ SOLN: 25.0000 ug | INTRAMUSCULAR | Status: DC | PRN

## 2017-02-07 MED ORDER — SODIUM CHLORIDE 0.9 % IV SOLN
INTRAVENOUS | Status: DC
  Administered 2017-02-07: 07:00:00 via INTRAVENOUS

## 2017-02-07 MED ORDER — AMIODARONE HCL 200 MG PO TABS: 200.0000 mg | ORAL_TABLET | Freq: Every day | ORAL | 6 refills | Status: DC

## 2017-02-07 MED ORDER — PHENYLEPHRINE HCL 10 MG/ML IJ SOLN
INTRAMUSCULAR | Status: AC
  Filled 2017-02-07: qty 1

## 2017-02-07 NOTE — Interval H&P Note (Signed)
History and Physical Interval Note:  02/07/2017 7:56 AM  [name] [name]  has presented today for surgery, with the diagnosis of Cardioversion   Afib  The various methods of treatment have been discussed with the patient and family. After consideration of risks, benefits and other options for treatment, the patient has consented to  Procedure(s): CARDIOVERSION (N/A) as a surgical intervention .  The patient's history has been reviewed, patient examined, no change in status, stable for surgery.  I have reviewed the patient's chart and labs.  Questions were answered to the patient's satisfaction.     [name]

## 2017-02-07 NOTE — Anesthesia Postprocedure Evaluation (Signed)
Anesthesia Post Note  [name]  Procedure(s) Performed: Procedure(s) (LRB): CARDIOVERSION (N/A)  Patient location during evaluation: PACU Anesthesia Type: General Level of consciousness: awake and alert and oriented Pain management: pain level controlled Vital Signs Assessment: post-procedure vital signs reviewed and stable Respiratory status: spontaneous breathing Cardiovascular status: blood pressure returned to baseline Anesthetic complications: no     Last Vitals:  Vitals:   02/07/17 0800 02/07/17 0815  BP: (!) 108/55 (!) 114/57  Pulse: (!) 49 (!) 54  Resp: 17 (!) 22  Temp:      Last Pain:  Vitals:   02/07/17 0800  PainSc: 0-No pain                 Kimmie Doren

## 2017-02-07 NOTE — Transfer of Care (Signed)
Immediate Anesthesia Transfer of Care Note  [name]  Procedure(s) Performed: Procedure(s): CARDIOVERSION (N/A)  Patient Location: PACU  Anesthesia Type:MAC  Level of Consciousness: sedated and responds to stimulation  Airway & Oxygen Therapy: Patient Spontanous Breathing and Patient connected to nasal cannula oxygen  Post-op Assessment: Report given to RN and Post -op Vital signs reviewed and stable  Post vital signs: Reviewed and stable  Last Vitals:  Vitals:   02/07/17 0744 02/07/17 0745  BP:  (!) 103/57  Pulse: (!) 59 (!) 53  Resp: (!) 22 13  Temp:      Last Pain: There were no vitals filed for this visit.       Complications: No apparent anesthesia complications

## 2017-02-07 NOTE — Anesthesia Post-op Follow-up Note (Cosign Needed)
Anesthesia QCDR form completed.        

## 2017-02-07 NOTE — Anesthesia Preprocedure Evaluation (Signed)
Anesthesia Evaluation  Patient identified by MRN, date of birth, ID band Patient awake    Reviewed: Allergy & Precautions, H&P , NPO status , Patient's Chart, lab work & pertinent test results, reviewed documented beta blocker date and time   History of Anesthesia Complications (+) PONV and history of anesthetic complications  Airway Mallampati: II  TM Distance: >3 FB Neck ROM: full    Dental no notable dental hx. (+) Teeth Intact   Pulmonary neg pulmonary ROS, shortness of breath,    Pulmonary exam normal breath sounds clear to auscultation       Cardiovascular Exercise Tolerance: Good hypertension, Pt. on medications + Peripheral Vascular Disease  negative cardio ROS  + dysrhythmias Atrial Fibrillation  Rhythm:regular Rate:Normal     Neuro/Psych  Headaches, PSYCHIATRIC DISORDERS  Neuromuscular disease negative neurological ROS  negative psych ROS   GI/Hepatic negative GI ROS, Neg liver ROS, hiatal hernia, GERD  Medicated,  Endo/Other  negative endocrine ROSdiabetes  Renal/GU Renal InsufficiencyRenal disease     Musculoskeletal  (+) Arthritis , Osteoarthritis,    Abdominal   Peds  Hematology negative hematology ROS (+)   Anesthesia Other Findings   Reproductive/Obstetrics negative OB ROS                             Anesthesia Physical  Anesthesia Plan  ASA: III  Anesthesia Plan: General   Post-op Pain Management:    Induction: Inhalational  Airway Management Planned: Nasal Cannula  Additional Equipment:   Intra-op Plan:   Post-operative Plan:   Informed Consent: I have reviewed the patients History and Physical, chart, labs and discussed the procedure including the risks, benefits and alternatives for the proposed anesthesia with the patient or authorized representative who has indicated his/her understanding and acceptance.     Plan Discussed with: CRNA and  Surgeon  Anesthesia Plan Comments:         Anesthesia Quick Evaluation

## 2017-02-07 NOTE — CV Procedure (Addendum)
Cardioversion note: A standard informed consent was obtained. Timeout was performed. The pads were placed in the anterior posterior fashion. The patient was given propofol by the anesthesia team.  Successful cardioversion was performed with a 100 J. The patient converted to sinus rhythm. Pre-and post EKGs were reviewed. The patient tolerated the procedure with no immediate complications.  Recommendations: Decrease Amiodarone to 200 mg once daily, continue other medications and follow-up in 2 weeks.

## 2017-02-07 NOTE — H&P (View-Only) (Signed)
Cardiology Office Note   Date:  02/04/2017   ID:  [name], DOB 1936-05-14, MRN [name]  PCP:  [name], MD  Cardiologist:   [name], MD   Chief Complaint  Patient presents with  . other    follow up from [facility] ER; A-fib. Meds reviewed by the pt. verbally. Pt. c/o still feeling like in A-Fib, weakness with pain across her shoulders.       History of Present Illness: [name] [name] is a 81 y.o. female who presents for a followup visit regarding persistent atrial fibrillation.   She has known history of persistent atrial fibrillation/flutter status post multiple cardioversions in the past. 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 She had cardiac catheterization twice in the past without significant coronary artery disease.  Most recent cardioversion was in August of 2014. She did have bradycardia post cardioversion. She has known history of labile hypertension. Most recent Echocardiogram in November 2016 showed normal LV systolic function with mildly dilated left atrium, mild mitral regurgitation, and mild to moderate pulmonary hypertension.  She had intermittent Mobitz 2 AV block which resolved after decreasing the dose of amiodarone to 100 mg once daily.  She is on long-term anticoagulation with Eliquis. Most recent cardioversion was in August 2014. She was seen last month and was noted to be in sinus rhythm at that time. She started having palpitations on Wednesday with associated shortness of breath and fatigue. This is very typical when she goes into atrial fibrillation. She went to the emergency room was noted to be in atrial fibrillation but ventricular rate was not extremely high. Heart rate was ranging from 110-115 bpm. Her labs were unremarkable. The patient was discharged and asked to follow-up with [name] [name]. She continues to feel poorly.    Past Medical History:  Diagnosis Date  . Arthritis   . Atrial flutter ([place])  02/2011   s/p cardioversion   . Cancer (HCC)    BREAST  . Chronic kidney disease    acute renal failure secondary to dehydration which is now resolved  . Cystocele   . GERD (gastroesophageal reflux disease)   . Headache(784.0)    chronic  . Hyperlipidemia   . Hypertension   . Knee fracture   . Light headedness    due to dehydration  . Mobitz type 2 second degree atrioventricular block    a. felt to be 2/2 amiodarone, resolved with decreased amiodarone dose  . Persistent atrial fibrillation ([place])    a. status post multiple DCCV, most recently in 07/2013  . PONV (postoperative nausea and vomiting)    oxycodone and codiene cause N/V   . Pre-syncope   . 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 Venous insufficiency   . Vertigo     Past Surgical History:  Procedure Laterality Date  . ABDOMINAL HYSTERECTOMY  1990  . APPENDECTOMY    . AUGMENTATION MAMMAPLASTY Bilateral 1986   implants  . AUGMENTATION MAMMAPLASTY  1990  . AUGMENTATION MAMMAPLASTY  2011  . CARDIAC CATHETERIZATION    . CARDIOVERSION     x 3  . CARDIOVERSION    . CATARACT EXTRACTION W/PHACO Right 09/21/2016   Procedure: CATARACT EXTRACTION PHACO AND INTRAOCULAR LENS PLACEMENT (IOC);  Surgeon: Birder Robson, MD;  Location: [facility] ORS;  Service: Ophthalmology;  Laterality: Right;  [name] [name] 44.1 AP% 16.5 CDE 7.30 Fluid Pack Lot [id number]  . CATARACT EXTRACTION W/PHACO Left 10/19/2016   Procedure: CATARACT EXTRACTION PHACO AND INTRAOCULAR  LENS PLACEMENT (IOC);  Surgeon: Birder Robson, MD;  Location: [facility] ORS;  Service: Ophthalmology;  Laterality: Left;  [name] [name] 53.7 AP% 19.5 CDE 10.45 Fluid pack lot # [id number]  . CHOLECYSTECTOMY    . COMBINED AUGMENTATION MAMMAPLASTY AND ABDOMINOPLASTY    . JOINT REPLACEMENT Left 06/04/2013   left knee  . KNEE ARTHROSCOPY Right 08/16/2016   Procedure: ARTHROSCOPY KNEE, tear posterior horn medial meniscus, tear anterior and posterior horns of lateral meniscus, chondromalacia of lateral  compartment grade 3 patella and grade 4 medial;  Surgeon: Dereck Leep, MD;  Location: [facility] ORS;  Service: Orthopedics;  Laterality: Right;  . MASTECTOMY  1986   Bilateral with silicone  breast implants, s/p saline replacements  . Multiple orthopedic procedures    . NOSE SURGERY    . TOTAL KNEE ARTHROPLASTY Left      Current Outpatient Prescriptions  Medication Sig Dispense Refill  . acetaminophen (TYLENOL) 500 MG tablet Take 1,000 mg by mouth every 6 (six) hours as needed (pain).     [name] aluminum-magnesium hydroxide 200-200 MG/5ML suspension Take 20 mLs by mouth every 6 (six) hours as needed for indigestion.     [name] amiodarone (PACERONE) 200 MG tablet Take 0.5 tablets (100 mg total) by mouth daily. 15 tablet 6  . amLODipine (NORVASC) 2.5 MG tablet Take 1-2 tablets (2.5-5 mg total) by mouth 2 (two) times daily. 2.5 in am and 5mg  in the evening 180 tablet 3  . apixaban (ELIQUIS) 5 MG TABS tablet Take 1 tablet (5 mg total) by mouth 2 (two) times daily. 60 tablet 6  . cetirizine (ZYRTEC) 10 MG tablet Take 10 mg by mouth daily as needed for allergies.    [name] ergocalciferol (DRISDOL) 50000 units capsule Take 1 capsule (50,000 Units total) by mouth once a week. 4 capsule 2  . fluticasone (FLONASE) 50 MCG/ACT nasal spray Place 2 sprays into both nostrils daily. 16 g 0  . furosemide (LASIX) 20 MG tablet Take 1 tablet (20 mg total) by mouth daily. As needed for edema not more than every other day 45 tablet 1  . HYDROcodone-acetaminophen (NORCO) 5-325 MG tablet Take 1-2 tablets by mouth every 4 (four) hours as needed for moderate pain. 40 tablet 0  . pantoprazole (PROTONIX) 40 MG tablet Take 1 tablet (40 mg total) by mouth daily. 30 tablet 11  . pravastatin (PRAVACHOL) 20 MG tablet TAKE ONE (1) TABLET BY MOUTH EVERY DAY 90 tablet 0  . Probiotic Product (PROBIOTIC ADVANCED PO) Take 1 tablet by mouth daily.    . ranitidine (ZANTAC) 150 MG tablet Take 150 mg by mouth 2 (two) times daily.    . sodium chloride  (OCEAN) 0.65 % SOLN nasal spray Place 1 spray into both nostrils 2 (two) times daily as needed for congestion.      No current facility-administered medications for this visit.     Allergies:   Iodine; Biaxin [clarithromycin]; Codeine; Enalapril; Fluocinonide; Oxycodone; Promethazine; Warfarin and related; and Xarelto [rivaroxaban]    Social History:  The patient  reports that she has never smoked. She has never used smokeless tobacco. She reports that she does not drink alcohol or use drugs.   Family History:  The patient's family history includes Breast cancer in her maternal grandmother, sister, sister, and sister; Cancer in her father, maternal grandmother, sister, and sister; Heart disease in her mother and son.    ROS:  Please see the history of present illness.   Otherwise, review of systems are positive for none.  All other systems are reviewed and negative.    PHYSICAL EXAM: VS:  BP 120/70 (BP Location: Left Arm, Patient Position: Sitting, Cuff Size: Normal)   Pulse (!) 104   Ht 5' 4.5" (1.638 m)   Wt 159 lb 4 oz (72.2 kg)   BMI 26.91 kg/m  , BMI Body mass index is 26.91 kg/m. GEN: Well nourished, well developed, in no acute distress  HEENT: normal  Neck: no JVD, carotid bruits, or masses Cardiac: RRR; no murmurs, rubs, or gallops,no edema  Respiratory:  clear to auscultation bilaterally, normal work of breathing GI: soft, nontender, nondistended, + BS MS: no deformity or atrophy  Skin: warm and dry, no rash Neuro:  Strength and sensation are intact Psych: euthymic mood, full affect   EKG:  EKG is ordered today. The ekg ordered today demonstrates atrial fibrillation with rapid ventricular response. Ventricular rate is 104 bpm. Right bundle branch block with T wave changes suggestive of anterolateral ischemia.  Recent Labs: 01/24/2017: TSH 2.72 02/03/2017: ALT 17; BUN 20; Creatinine, Ser 1.00; Hemoglobin 14.0; Platelets 236; Potassium 3.9; Sodium 138    Lipid Panel     Component Value Date/Time   CHOL 169 01/24/2017 0823   TRIG 90.0 01/24/2017 0823   HDL 66.60 01/24/2017 0823   CHOLHDL 3 01/24/2017 0823   VLDL 18.0 01/24/2017 0823   LDLCALC 85 01/24/2017 0823      Wt Readings from Last 3 Encounters:  02/04/17 159 lb 4 oz (72.2 kg)  02/03/17 156 lb (70.8 kg)  01/10/17 157 lb 9.6 oz (71.5 kg)       ASSESSMENT AND PLAN:  1.  Persistent atrial fibrillation: The patient is currently in A. fib with RVR and she is very symptomatic. She is on anticoagulation with Eliquis and has not missed any doses.  I think the best option is to proceed with urgent cardioversion given that she is very symptomatic when she goes into A. Fib. We scheduled the procedure on Monday. In the meanwhile, I asked her to increase amiodarone to 200 mg twice daily. The dose will be decreased after cardioversion.  2. Essential hypertension: Blood pressure is well controlled on current medications.  3. History of tachycardia-induced cardiomyopathy. Most recent ejection fraction was normal .  4. Chronic venous insufficiency mostly affecting the right leg: continue with support stockings and leg elevation. She uses furosemide as needed.  Disposition:   FU with me in 1 months  Signed,  [name], MD  02/04/2017 11:46 AM    [place]

## 2017-02-09 ENCOUNTER — Ambulatory Visit: Payer: Medicare Other | Admitting: Internal Medicine

## 2017-02-14 DIAGNOSIS — L570 Actinic keratosis: Secondary | ICD-10-CM | POA: Diagnosis not present

## 2017-02-14 DIAGNOSIS — Z872 Personal history of diseases of the skin and subcutaneous tissue: Secondary | ICD-10-CM | POA: Diagnosis not present

## 2017-02-14 DIAGNOSIS — Z85828 Personal history of other malignant neoplasm of skin: Secondary | ICD-10-CM | POA: Diagnosis not present

## 2017-02-14 DIAGNOSIS — D229 Melanocytic nevi, unspecified: Secondary | ICD-10-CM | POA: Diagnosis not present

## 2017-02-14 DIAGNOSIS — L821 Other seborrheic keratosis: Secondary | ICD-10-CM | POA: Diagnosis not present

## 2017-02-14 DIAGNOSIS — D485 Neoplasm of uncertain behavior of skin: Secondary | ICD-10-CM | POA: Diagnosis not present

## 2017-02-15 ENCOUNTER — Ambulatory Visit: Payer: Medicare Other | Admitting: Cardiovascular Disease

## 2017-02-18 ENCOUNTER — Ambulatory Visit (INDEPENDENT_AMBULATORY_CARE_PROVIDER_SITE_OTHER): Payer: Medicare Other | Admitting: Cardiovascular Disease

## 2017-02-18 ENCOUNTER — Encounter: Payer: Self-pay | Admitting: Cardiovascular Disease

## 2017-02-18 VITALS — BP 110/56 | HR 58 | Ht 64.5 in | Wt 159.5 lb

## 2017-02-18 DIAGNOSIS — I4819 Other persistent atrial fibrillation: Secondary | ICD-10-CM

## 2017-02-18 DIAGNOSIS — I10 Essential (primary) hypertension: Secondary | ICD-10-CM | POA: Diagnosis not present

## 2017-02-18 DIAGNOSIS — I481 Persistent atrial fibrillation: Secondary | ICD-10-CM

## 2017-02-18 NOTE — Progress Notes (Signed)
Cardiology Office Note   Date:  02/18/2017   ID:  [name] [name], DOB 07/26/1936, MRN [id number]  PCP:  [name] Mc, MD  Cardiologist:   [name], MD   Chief Complaint  Patient presents with  . Other    F/u Cardioversion c/o occassional sob.  Meds reviewed verbally with pt.      History of Present Illness: [name] [name] is a 81 y.o. female who presents for a followup visit regarding persistent atrial fibrillation.   She has known history of persistent atrial fibrillation/flutter status post multiple cardioversions in the past. 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 She had cardiac catheterization twice in the past without significant coronary artery disease.  Most recent cardioversion was in August of 2014. She did have bradycardia post cardioversion. She has known history of labile hypertension. Most recent Echocardiogram in November 2016 showed normal LV systolic function with mildly dilated left atrium, mild mitral regurgitation, and mild to moderate pulmonary hypertension.  She had intermittent Mobitz 2 AV block which resolved after decreasing the dose of amiodarone to 100 mg once daily.  She is on long-term anticoagulation with Eliquis.   she was seen recently forA. fib with RVR and was highly symptomatic. I proceeded with cardioversion and she has been doing well since then with no recurrent symptoms. No chest pain, shortness of breath, palpitations or dizziness.   Past Medical History:  Diagnosis Date  . Arthritis   . Atrial flutter ([place]) 02/2011   s/p cardioversion   . Cancer (HCC)    BREAST  . Chronic kidney disease    acute renal failure secondary to dehydration which is now resolved  . Cystocele   . GERD (gastroesophageal reflux disease)   . Headache(784.0)    chronic  . Hyperlipidemia   . Hypertension   . Knee fracture   . Light headedness    due to dehydration  . Mobitz type 2 second degree  atrioventricular block    a. felt to be 2/2 amiodarone, resolved with decreased amiodarone dose  . Persistent atrial fibrillation ([place])    a. status post multiple DCCV, most recently in 07/2013  . PONV (postoperative nausea and vomiting)    oxycodone and codiene cause N/V   . Pre-syncope   . 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 Venous insufficiency   . Vertigo     Past Surgical History:  Procedure Laterality Date  . ABDOMINAL HYSTERECTOMY  1990  . APPENDECTOMY    . AUGMENTATION MAMMAPLASTY Bilateral 1986   implants  . AUGMENTATION MAMMAPLASTY  1990  . AUGMENTATION MAMMAPLASTY  2011  . CARDIAC CATHETERIZATION    . CARDIOVERSION     x 3  . CARDIOVERSION    . CARDIOVERSION N/A 02/07/2017   Procedure: CARDIOVERSION;  Surgeon: Wellington Hampshire, MD;  Location: [facility] ORS;  Service: Cardiovascular;  Laterality: N/A;  . CATARACT EXTRACTION W/PHACO Right 09/21/2016   Procedure: CATARACT EXTRACTION PHACO AND INTRAOCULAR LENS PLACEMENT ([place]);  Surgeon: Birder Robson, MD;  Location: [facility] ORS;  Service: Ophthalmology;  Laterality: Right;  [place] 44.1 AP% 16.5 CDE 7.30 Fluid Pack Lot [id number]  . CATARACT EXTRACTION W/PHACO Left 10/19/2016   Procedure: CATARACT EXTRACTION PHACO AND INTRAOCULAR LENS PLACEMENT (IOC);  Surgeon: Birder Robson, MD;  Location: [facility] ORS;  Service: Ophthalmology;  Laterality: Left;  [place] 53.7 AP% 19.5 CDE 10.45 Fluid pack lot # [id number]  . CHOLECYSTECTOMY    . COMBINED AUGMENTATION MAMMAPLASTY AND ABDOMINOPLASTY    .  JOINT REPLACEMENT Left 06/04/2013   left knee  . KNEE ARTHROSCOPY Right 08/16/2016   Procedure: ARTHROSCOPY KNEE, tear posterior horn medial meniscus, tear anterior and posterior horns of lateral meniscus, chondromalacia of lateral compartment grade 3 patella and grade 4 medial;  Surgeon: Dereck Leep, MD;  Location: [facility] ORS;  Service: Orthopedics;  Laterality: Right;  . MASTECTOMY  1986   Bilateral with silicone  breast implants, s/p saline  replacements  . Multiple orthopedic procedures    . NOSE SURGERY    . TOTAL KNEE ARTHROPLASTY Left      Current Outpatient Prescriptions  Medication Sig Dispense Refill  . acetaminophen (TYLENOL) 500 MG tablet Take 1,000 mg by mouth every 6 (six) hours as needed (pain).     [name] aluminum-magnesium hydroxide 200-200 MG/5ML suspension Take 20 mLs by mouth every 6 (six) hours as needed for indigestion.     [name] amiodarone (PACERONE) 200 MG tablet Take 1 tablet (200 mg total) by mouth daily. 60 tablet 6  . amLODipine (NORVASC) 2.5 MG tablet Take 1-2 tablets (2.5-5 mg total) by mouth 2 (two) times daily. 2.5 in am and 5mg  in the evening 180 tablet 3  . apixaban (ELIQUIS) 5 MG TABS tablet Take 1 tablet (5 mg total) by mouth 2 (two) times daily. 60 tablet 6  . cetirizine (ZYRTEC) 10 MG tablet Take 10 mg by mouth daily as needed for allergies.    [name] ergocalciferol (DRISDOL) 50000 units capsule Take 1 capsule (50,000 Units total) by mouth once a week. 4 capsule 2  . fluticasone (FLONASE) 50 MCG/ACT nasal spray Place 2 sprays into both nostrils daily. 16 g 0  . furosemide (LASIX) 20 MG tablet Take 1 tablet (20 mg total) by mouth daily. As needed for edema not more than every other day 45 tablet 1  . HYDROcodone-acetaminophen (NORCO) 5-325 MG tablet Take 1-2 tablets by mouth every 4 (four) hours as needed for moderate pain. 40 tablet 0  . pantoprazole (PROTONIX) 40 MG tablet Take 1 tablet (40 mg total) by mouth daily. 30 tablet 11  . pravastatin (PRAVACHOL) 20 MG tablet TAKE ONE (1) TABLET BY MOUTH EVERY DAY 90 tablet 0  . Probiotic Product (PROBIOTIC ADVANCED PO) Take 1 tablet by mouth daily.    . ranitidine (ZANTAC) 150 MG tablet Take 150 mg by mouth 2 (two) times daily.    . sodium chloride (OCEAN) 0.65 % SOLN nasal spray Place 1 spray into both nostrils 2 (two) times daily as needed for congestion.      No current facility-administered medications for this visit.     Allergies:   Iodine; Biaxin  [clarithromycin]; Codeine; Enalapril; Fluocinonide; Oxycodone; Promethazine; Warfarin and related; and Xarelto [rivaroxaban]    Social History:  The patient  reports that she has never smoked. She has never used smokeless tobacco. She reports that she does not drink alcohol or use drugs.   Family History:  The patient's family history includes Breast cancer in her maternal grandmother, sister, sister, and sister; Cancer in her father, maternal grandmother, sister, and sister; Heart disease in her mother and son.    ROS:  Please see the history of present illness.   Otherwise, review of systems are positive for none.   All other systems are reviewed and negative.    PHYSICAL EXAM: VS:  BP (!) 110/56 (BP Location: Left Arm, Patient Position: Sitting, Cuff Size: Normal)   Pulse (!) 58   Ht 5' 4.5" (1.638 m)   Wt 159 lb  8 oz (72.3 kg)   BMI 26.96 kg/m  , BMI Body mass index is 26.96 kg/m. GEN: Well nourished, well developed, in no acute distress  HEENT: normal  Neck: no JVD, carotid bruits, or masses Cardiac: RRR; no murmurs, rubs, or gallops,no edema  Respiratory:  clear to auscultation bilaterally, normal work of breathing GI: soft, nontender, nondistended, + BS MS: no deformity or atrophy  Skin: warm and dry, no rash Neuro:  Strength and sensation are intact Psych: euthymic mood, full affect   EKG:  EKG is ordered today. The ekg ordered today demonstrates  sinus bradycardia with a heart rate of 58 bpm, right bundle branch block  Recent Labs: 01/24/2017: TSH 2.72 02/03/2017: ALT 17; BUN 20; Creatinine, Ser 1.00; Hemoglobin 14.0; Platelets 236; Potassium 3.9; Sodium 138    Lipid Panel    Component Value Date/Time   CHOL 169 01/24/2017 0823   TRIG 90.0 01/24/2017 0823   HDL 66.60 01/24/2017 0823   CHOLHDL 3 01/24/2017 0823   VLDL 18.0 01/24/2017 0823   LDLCALC 85 01/24/2017 0823      Wt Readings from Last 3 Encounters:  02/18/17 159 lb 8 oz (72.3 kg)  02/07/17 157 lb  (71.2 kg)  02/04/17 159 lb 4 oz (72.2 kg)       ASSESSMENT AND PLAN:  1.  Persistent atrial fibrillation: She is maintaining in sinus rhythm after recent successful cardioversion. I'm going to continue same dose of amiodarone 200 mg once daily. She did have previous second degree AV block on this dose and will have to monitor this closely. Continue anticoagulation. Follow-up in 3 months.  2. Essential hypertension: Blood pressure is well controlled on current medications.  3. History of tachycardia-induced cardiomyopathy. Most recent ejection fraction was normal .   Disposition:   FU with me in 3 months  Signed,  [name], MD  02/18/2017 1:58 PM    [place]

## 2017-02-18 NOTE — Patient Instructions (Signed)
Medication Instructions: Continue same medications.   Labwork: None.   Procedures/Testing: None.   Follow-Up: 3 months with Dr. Jamiria Langill.   Any Additional Special Instructions Will Be Listed Below (If Applicable).     If you need a refill on your cardiac medications before your next appointment, please call your pharmacy.   

## 2017-02-28 DIAGNOSIS — M9902 Segmental and somatic dysfunction of thoracic region: Secondary | ICD-10-CM | POA: Diagnosis not present

## 2017-02-28 DIAGNOSIS — M5413 Radiculopathy, cervicothoracic region: Secondary | ICD-10-CM | POA: Diagnosis not present

## 2017-02-28 DIAGNOSIS — M5412 Radiculopathy, cervical region: Secondary | ICD-10-CM | POA: Diagnosis not present

## 2017-02-28 DIAGNOSIS — M9901 Segmental and somatic dysfunction of cervical region: Secondary | ICD-10-CM | POA: Diagnosis not present

## 2017-02-28 DIAGNOSIS — M542 Cervicalgia: Secondary | ICD-10-CM | POA: Diagnosis not present

## 2017-03-03 DIAGNOSIS — M5413 Radiculopathy, cervicothoracic region: Secondary | ICD-10-CM | POA: Diagnosis not present

## 2017-03-03 DIAGNOSIS — M542 Cervicalgia: Secondary | ICD-10-CM | POA: Diagnosis not present

## 2017-03-03 DIAGNOSIS — M5412 Radiculopathy, cervical region: Secondary | ICD-10-CM | POA: Diagnosis not present

## 2017-03-03 DIAGNOSIS — M9902 Segmental and somatic dysfunction of thoracic region: Secondary | ICD-10-CM | POA: Diagnosis not present

## 2017-03-03 DIAGNOSIS — M9901 Segmental and somatic dysfunction of cervical region: Secondary | ICD-10-CM | POA: Diagnosis not present

## 2017-03-08 DIAGNOSIS — M542 Cervicalgia: Secondary | ICD-10-CM | POA: Diagnosis not present

## 2017-03-08 DIAGNOSIS — M9902 Segmental and somatic dysfunction of thoracic region: Secondary | ICD-10-CM | POA: Diagnosis not present

## 2017-03-08 DIAGNOSIS — M9901 Segmental and somatic dysfunction of cervical region: Secondary | ICD-10-CM | POA: Diagnosis not present

## 2017-03-08 DIAGNOSIS — M5413 Radiculopathy, cervicothoracic region: Secondary | ICD-10-CM | POA: Diagnosis not present

## 2017-03-08 DIAGNOSIS — M5412 Radiculopathy, cervical region: Secondary | ICD-10-CM | POA: Diagnosis not present

## 2017-03-09 DIAGNOSIS — M9902 Segmental and somatic dysfunction of thoracic region: Secondary | ICD-10-CM | POA: Diagnosis not present

## 2017-03-09 DIAGNOSIS — M5413 Radiculopathy, cervicothoracic region: Secondary | ICD-10-CM | POA: Diagnosis not present

## 2017-03-09 DIAGNOSIS — M5412 Radiculopathy, cervical region: Secondary | ICD-10-CM | POA: Diagnosis not present

## 2017-03-09 DIAGNOSIS — M542 Cervicalgia: Secondary | ICD-10-CM | POA: Diagnosis not present

## 2017-03-09 DIAGNOSIS — M9901 Segmental and somatic dysfunction of cervical region: Secondary | ICD-10-CM | POA: Diagnosis not present

## 2017-03-10 DIAGNOSIS — M542 Cervicalgia: Secondary | ICD-10-CM | POA: Diagnosis not present

## 2017-03-10 DIAGNOSIS — M9901 Segmental and somatic dysfunction of cervical region: Secondary | ICD-10-CM | POA: Diagnosis not present

## 2017-03-10 DIAGNOSIS — M9902 Segmental and somatic dysfunction of thoracic region: Secondary | ICD-10-CM | POA: Diagnosis not present

## 2017-03-10 DIAGNOSIS — M5412 Radiculopathy, cervical region: Secondary | ICD-10-CM | POA: Diagnosis not present

## 2017-03-10 DIAGNOSIS — M5413 Radiculopathy, cervicothoracic region: Secondary | ICD-10-CM | POA: Diagnosis not present

## 2017-03-14 DIAGNOSIS — M542 Cervicalgia: Secondary | ICD-10-CM | POA: Diagnosis not present

## 2017-03-14 DIAGNOSIS — M9901 Segmental and somatic dysfunction of cervical region: Secondary | ICD-10-CM | POA: Diagnosis not present

## 2017-03-14 DIAGNOSIS — M5413 Radiculopathy, cervicothoracic region: Secondary | ICD-10-CM | POA: Diagnosis not present

## 2017-03-14 DIAGNOSIS — M5412 Radiculopathy, cervical region: Secondary | ICD-10-CM | POA: Diagnosis not present

## 2017-03-17 DIAGNOSIS — M542 Cervicalgia: Secondary | ICD-10-CM | POA: Diagnosis not present

## 2017-03-17 DIAGNOSIS — M9902 Segmental and somatic dysfunction of thoracic region: Secondary | ICD-10-CM | POA: Diagnosis not present

## 2017-03-17 DIAGNOSIS — M9901 Segmental and somatic dysfunction of cervical region: Secondary | ICD-10-CM | POA: Diagnosis not present

## 2017-03-17 DIAGNOSIS — M5413 Radiculopathy, cervicothoracic region: Secondary | ICD-10-CM | POA: Diagnosis not present

## 2017-03-17 DIAGNOSIS — M5412 Radiculopathy, cervical region: Secondary | ICD-10-CM | POA: Diagnosis not present

## 2017-03-18 ENCOUNTER — Ambulatory Visit: Payer: Medicare Other | Admitting: Podiatry

## 2017-03-18 ENCOUNTER — Ambulatory Visit: Payer: Medicare Other

## 2017-03-22 ENCOUNTER — Ambulatory Visit (INDEPENDENT_AMBULATORY_CARE_PROVIDER_SITE_OTHER): Payer: Medicare Other

## 2017-03-22 ENCOUNTER — Other Ambulatory Visit: Payer: Self-pay

## 2017-03-22 VITALS — BP 118/62 | HR 53 | Temp 97.6°F | Resp 14 | Ht 63.0 in | Wt 159.0 lb

## 2017-03-22 DIAGNOSIS — M542 Cervicalgia: Secondary | ICD-10-CM | POA: Diagnosis not present

## 2017-03-22 DIAGNOSIS — M5413 Radiculopathy, cervicothoracic region: Secondary | ICD-10-CM | POA: Diagnosis not present

## 2017-03-22 DIAGNOSIS — M5412 Radiculopathy, cervical region: Secondary | ICD-10-CM | POA: Diagnosis not present

## 2017-03-22 DIAGNOSIS — M9901 Segmental and somatic dysfunction of cervical region: Secondary | ICD-10-CM | POA: Diagnosis not present

## 2017-03-22 DIAGNOSIS — Z0000 Encounter for general adult medical examination without abnormal findings: Secondary | ICD-10-CM | POA: Diagnosis not present

## 2017-03-22 DIAGNOSIS — M9902 Segmental and somatic dysfunction of thoracic region: Secondary | ICD-10-CM | POA: Diagnosis not present

## 2017-03-22 NOTE — Progress Notes (Signed)
Subjective:   [name] [name] is a 81 y.o. female who presents for Medicare Annual (Subsequent) preventive examination.  Review of Systems:  No ROS.  Medicare Wellness Visit.  Cardiac Risk Factors include: advanced age (>52men, >57 women);hypertension     Objective:     Vitals: BP 118/62 (BP Location: Left Arm, Patient Position: Sitting, Cuff Size: Normal)   Pulse (!) 53   Temp 97.6 F (36.4 C) (Oral)   Resp 14   Ht 5\' 3"  (1.6 m)   Wt 159 lb (72.1 kg)   SpO2 97%   BMI 28.17 kg/m   Body mass index is 28.17 kg/m.   Tobacco History  Smoking Status  . Never Smoker  Smokeless Tobacco  . Never Used     Counseling given: Not Answered   Past Medical History:  Diagnosis Date  . Arthritis   . Atrial flutter ([place]) 02/2011   s/p cardioversion   . Cancer (HCC)    BREAST  . Chronic kidney disease    acute renal failure secondary to dehydration which is now resolved  . Cystocele   . GERD (gastroesophageal reflux disease)   . Headache(784.0)    chronic  . Hyperlipidemia   . Hypertension   . Knee fracture   . Light headedness    due to dehydration  . Mobitz type 2 second degree atrioventricular block    a. felt to be 2/2 amiodarone, resolved with decreased amiodarone dose  . Persistent atrial fibrillation ([place])    a. status post multiple DCCV, most recently in 07/2013  . PONV (postoperative nausea and vomiting)    oxycodone and codiene cause N/V   . Pre-syncope   . 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)   . Venous insufficiency   . Vertigo    Past Surgical History:  Procedure Laterality Date  . ABDOMINAL HYSTERECTOMY  1990  . APPENDECTOMY    . AUGMENTATION MAMMAPLASTY Bilateral 1986   implants  . AUGMENTATION MAMMAPLASTY  1990  . AUGMENTATION MAMMAPLASTY  2011  . CARDIAC CATHETERIZATION    . CARDIOVERSION     x 3  . CARDIOVERSION    . CARDIOVERSION N/A 02/07/2017   Procedure: CARDIOVERSION;  Surgeon: Wellington Hampshire, MD;  Location: [facility] ORS;  Service:  Cardiovascular;  Laterality: N/A;  . CATARACT EXTRACTION W/PHACO Right 09/21/2016   Procedure: CATARACT EXTRACTION PHACO AND INTRAOCULAR LENS PLACEMENT ([place]);  Surgeon: Birder Robson, MD;  Location: [facility] ORS;  Service: Ophthalmology;  Laterality: Right;  [place] 44.1 AP% 16.5 CDE 7.30 Fluid Pack Lot #[id number]  . CATARACT EXTRACTION W/PHACO Left 10/19/2016   Procedure: CATARACT EXTRACTION PHACO AND INTRAOCULAR LENS PLACEMENT ([facility]);  Surgeon: Birder Robson, MD;  Location: [facility] ORS;  Service: Ophthalmology;  Laterality: Left;  [place] 53.7 AP% 19.5 CDE 10.45 Fluid pack lot # [id number]  . CHOLECYSTECTOMY    . COMBINED AUGMENTATION MAMMAPLASTY AND ABDOMINOPLASTY    . JOINT REPLACEMENT Left 06/04/2013   left knee  . KNEE ARTHROSCOPY Right 08/16/2016   Procedure: ARTHROSCOPY KNEE, tear posterior horn medial meniscus, tear anterior and posterior horns of lateral meniscus, chondromalacia of lateral compartment grade 3 patella and grade 4 medial;  Surgeon: Dereck Leep, MD;  Location: [facility] ORS;  Service: Orthopedics;  Laterality: Right;  . MASTECTOMY  1986   Bilateral with silicone  breast implants, s/p saline replacements  . Multiple orthopedic procedures    . NOSE SURGERY    . TOTAL KNEE ARTHROPLASTY Left    Family History  Problem Relation Age of  Onset  . Heart disease Mother   . Cancer Father     stomach  . Heart disease Son     found at autopsy  . Cancer Sister     breast  . Breast cancer Sister   . Cancer Maternal Grandmother     breast  . Breast cancer Maternal Grandmother   . Cancer Sister     breast  . Breast cancer Sister   . Breast cancer Sister   . Cancer Sister     breast  . Diabetes Neg Hx    History  Sexual Activity  . Sexual activity: No    Outpatient Encounter Prescriptions as of 03/22/2017  Medication Sig  . acetaminophen (TYLENOL) 500 MG tablet Take 1,000 mg by mouth every 6 (six) hours as needed (pain).   [name] aluminum-magnesium hydroxide 200-200 MG/5ML suspension  Take 20 mLs by mouth every 6 (six) hours as needed for indigestion.   [name] amiodarone (PACERONE) 200 MG tablet Take 1 tablet (200 mg total) by mouth daily.  [name] amLODipine (NORVASC) 2.5 MG tablet Take 1-2 tablets (2.5-5 mg total) by mouth 2 (two) times daily. 2.5 in am and 5mg  in the evening  . apixaban (ELIQUIS) 5 MG TABS tablet Take 1 tablet (5 mg total) by mouth 2 (two) times daily.  . cetirizine (ZYRTEC) 10 MG tablet Take 10 mg by mouth daily as needed for allergies.  [name] ergocalciferol (DRISDOL) 50000 units capsule Take 1 capsule (50,000 Units total) by mouth once a week.  . fluticasone (FLONASE) 50 MCG/ACT nasal spray Place 2 sprays into both nostrils daily.  . furosemide (LASIX) 20 MG tablet Take 1 tablet (20 mg total) by mouth daily. As needed for edema not more than every other day  . HYDROcodone-acetaminophen (NORCO) 5-325 MG tablet Take 1-2 tablets by mouth every 4 (four) hours as needed for moderate pain.  . pantoprazole (PROTONIX) 40 MG tablet Take 1 tablet (40 mg total) by mouth daily.  . pravastatin (PRAVACHOL) 20 MG tablet TAKE ONE (1) TABLET BY MOUTH EVERY DAY  . Probiotic Product (PROBIOTIC ADVANCED PO) Take 1 tablet by mouth daily.  . sodium chloride (OCEAN) 0.65 % SOLN nasal spray Place 1 spray into both nostrils 2 (two) times daily as needed for congestion.   . [DISCONTINUED] ranitidine (ZANTAC) 150 MG tablet Take 150 mg by mouth 2 (two) times daily.   No facility-administered encounter medications on file as of 03/22/2017.     Activities of Daily Living In your present state of health, do you have any difficulty performing the following activities: 03/22/2017 08/06/2016  Hearing? N -  Vision? N -  Difficulty concentrating or making decisions? N -  Walking or climbing stairs? Y Y  Dressing or bathing? N -  Doing errands, shopping? N N  Preparing Food and eating ? N -  Using the Toilet? N -  In the past six months, have you accidently leaked urine? N -  Do you have problems with  loss of bowel control? N -  Managing your Medications? N -  Managing your Finances? N -  Housekeeping or managing your Housekeeping? N -  Some recent data might be hidden    Patient Care Team: [name], MD as PCP - General (Internal Medicine) Wellington Hampshire, MD as Consulting Physician (Cardiology)    Assessment:    This is a routine wellness examination for [place]. The goal of the wellness visit is to assist the patient how to close the gaps  in care and create a preventative care plan for the patient.   Taking calcium VIT D as appropriate/Osteoporosis risk reviewed.  Medications reviewed; taking without issues or barriers.  Safety issues reviewed; smoke detectors in the home. No firearms in the home.   Wears seatbelts when driving or riding with others. Patient does wear sunscreen or protective clothing when in direct sunlight. No violence in the home.  Patient is alert, normal appearance, oriented to person/place/and time. Correctly identified the president of the [place], recall of 3/3 words, and performing simple calculations.  Patient displays appropriate judgement and can read correct time from watch face.  No new identified risk were noted.  No failures at ADL's or IADL's.   BMI- discussed the importance of a healthy diet, water intake and exercise. Educational material provided.   HTN- followed by PCP.  Dental- every six months.  Eye- Visual acuity not assessed per patient preference since they have regular follow up with the ophthalmologist.  Wears corrective lenses.  Sleep patterns- Sleeps 4-5 hours at night.  Wakes feeling tired.  Naps in the evening.  Health maintenance gaps- closed.  Patient Concerns: None at this time. Follow up with PCP as needed.  Exercise Activities and Dietary recommendations Current Exercise Habits: Home exercise routine, Time (Minutes): 10, Frequency (Times/Week): 1, Weekly Exercise (Minutes/Week): 10, Intensity: Mild  Goals     . Increase physical activity          Ride the exercise bike 3 times a week, increase pace as tolerated.    . Increase water intake          Stay hydrated and drink plenty of fluids/water  Fill cup and finish all water when taking medications      Fall Risk Fall Risk  03/22/2017 03/18/2016 12/12/2015 06/12/2015 12/11/2014  Falls in the past year? Yes Yes No Yes Yes  Number falls in past yr: 1 1 - 1 1  Injury with Fall? Yes No - Yes Yes  Risk Factor Category  High Fall Risk - - - -  Risk for fall due to : History of fall(s) Impaired balance/gait - - Impaired balance/gait  Follow up Education provided Falls prevention discussed;Education provided - Falls prevention discussed -   Depression Screen PHQ 2/9 Scores 03/22/2017 03/18/2016 12/12/2015 06/12/2015  PHQ - 2 Score 0 0 0 0     Cognitive Function MMSE - Mini Mental State Exam 03/22/2017 03/18/2016  Orientation to time 5 5  Orientation to Place 5 5  Registration 3 3  Attention/ Calculation 5 5  Recall 3 3  Language- name 2 objects 2 2  Language- repeat 1 1  Language- follow 3 step command 3 3  Language- read & follow direction 1 1  Write a sentence 1 1  Copy design 1 1  Total score 30 30        Immunization History  Administered Date(s) Administered  . Influenza Split 10/03/2012  . Influenza-Unspecified 10/09/2014, 10/01/2015, 10/06/2016  . Pneumococcal Conjugate-13 12/11/2014  . Pneumococcal Polysaccharide-23 11/08/2003, 11/07/2013  . Td 06/22/2008, 11/30/2014  . Tdap 11/30/2014  . Zoster 04/24/2011   Screening Tests Health Maintenance  Topic Date Due  . INFLUENZA VACCINE  07/20/2017  . TETANUS/TDAP  11/30/2024  . DEXA SCAN  Completed  . PNA vac Low Risk Adult  Completed      Plan:    End of life planning; Advance aging; Advanced directives discussed. Copy of current HCPOA/Living Will requested.    Medicare Attestation I have  personally reviewed: The patient's medical and social history Their use of  alcohol, tobacco or illicit drugs Their current medications and supplements The patient's functional ability including ADLs,fall risks, home safety risks, cognitive, and hearing and visual impairment Diet and physical activities Evidence for depression   The patient's weight, height, BMI, and visual acuity have been recorded in the chart.  I have made referrals and provided education to the patient based on review of the above and I have provided the patient with a written personalized care plan for preventive services.    During the course of the visit the patient was educated and counseled about the following appropriate screening and preventive services:   Vaccines to include Pneumoccal, Influenza, Hepatitis B, Td, Zostavax, HCV  Cardiovascular Disease-followed by Dr. Fletcher Anon  Colorectal cancer screening-UTD  Bone density screening-UTD  Glaucoma screening-annual eye exam  Mammography-UTD  Nutrition counseling   Patient Instructions (the written plan) was given to the patient.   Varney Biles, LPN  02/21/3567   Reviewed above information.  Agree with plan.  Dr Nicki Reaper

## 2017-03-22 NOTE — Telephone Encounter (Signed)
Last OV with you 10/2016 Medication (NORCO 5-325) last administered 07/2016, 40 tabs Follow up scheduled 04/08/17 OK to refill and pick up Rx from front office tomorrow morning? Pended for your approval/denial

## 2017-03-22 NOTE — Telephone Encounter (Signed)
I cannot refill the vicodin without an office visit. This is a controlled substance,  And refills for chronic pain require an office visit to discuss what that entails,  She will have to sign a contract etc

## 2017-03-22 NOTE — Patient Instructions (Addendum)
Ms. Rane , Thank you for taking time to come for your Medicare Wellness Visit. I appreciate your ongoing commitment to your health goals. Please review the following plan we discussed and let me know if I can assist you in the future.  Follow up with Dr. Derrel Nip as needed.    Bring a copy of your [place] and/or Living Will to be scanned into chart.  Have a great day!   These are the goals we discussed: Goals    . Increase physical activity          Ride the exercise bike 3 times a week, increase pace as tolerated.    . Increase water intake          Stay hydrated and drink plenty of fluids/water  Fill cup and finish all water when taking medications       This is a list of the screening recommended for you and due dates:  Health Maintenance  Topic Date Due  . Flu Shot  07/20/2017  . Tetanus Vaccine  11/30/2024  . DEXA scan (bone density measurement)  Completed  . Pneumonia vaccines  Completed    Fall Prevention in the Home Falls can cause injuries. They can happen to people of all ages. There are many things you can do to make your home safe and to help prevent falls. What can I do on the outside of my home?  Regularly fix the edges of walkways and driveways and fix any cracks.  Remove anything that might make you trip as you walk through a door, such as a raised step or threshold.  Trim any bushes or trees on the path to your home.  Use bright outdoor lighting.  Clear any walking paths of anything that might make someone trip, such as rocks or tools.  Regularly check to see if handrails are loose or broken. Make sure that both sides of any steps have handrails.  Any raised decks and porches should have guardrails on the edges.  Have any leaves, snow, or ice cleared regularly.  Use sand or salt on walking paths during winter.  Clean up any spills in your garage right away. This includes oil or grease spills. What can I do in the  bathroom?  Use night lights.  Install grab bars by the toilet and in the tub and shower. Do not use towel bars as grab bars.  Use non-skid mats or decals in the tub or shower.  If you need to sit down in the shower, use a plastic, non-slip stool.  Keep the floor dry. Clean up any water that spills on the floor as soon as it happens.  Remove soap buildup in the tub or shower regularly.  Attach bath mats securely with double-sided non-slip rug tape.  Do not have throw rugs and other things on the floor that can make you trip. What can I do in the bedroom?  Use night lights.  Make sure that you have a light by your bed that is easy to reach.  Do not use any sheets or blankets that are too big for your bed. They should not hang down onto the floor.  Have a firm chair that has side arms. You can use this for support while you get dressed.  Do not have throw rugs and other things on the floor that can make you trip. What can I do in the kitchen?  Clean up any spills right away.  Avoid  walking on wet floors.  Keep items that you use a lot in easy-to-reach places.  If you need to reach something above you, use a strong step stool that has a grab bar.  Keep electrical cords out of the way.  Do not use floor polish or wax that makes floors slippery. If you must use wax, use non-skid floor wax.  Do not have throw rugs and other things on the floor that can make you trip. What can I do with my stairs?  Do not leave any items on the stairs.  Make sure that there are handrails on both sides of the stairs and use them. Fix handrails that are broken or loose. Make sure that handrails are as long as the stairways.  Check any carpeting to make sure that it is firmly attached to the stairs. Fix any carpet that is loose or worn.  Avoid having throw rugs at the top or bottom of the stairs. If you do have throw rugs, attach them to the floor with carpet tape.  Make sure that you have a  light switch at the top of the stairs and the bottom of the stairs. If you do not have them, ask someone to add them for you. What else can I do to help prevent falls?  Wear shoes that:  Do not have high heels.  Have rubber bottoms.  Are comfortable and fit you well.  Are closed at the toe. Do not wear sandals.  If you use a stepladder:  Make sure that it is fully opened. Do not climb a closed stepladder.  Make sure that both sides of the stepladder are locked into place.  Ask someone to hold it for you, if possible.  Clearly mark and make sure that you can see:  Any grab bars or handrails.  First and last steps.  Where the edge of each step is.  Use tools that help you move around (mobility aids) if they are needed. These include:  Canes.  Walkers.  Scooters.  Crutches.  Turn on the lights when you go into a dark area. Replace any light bulbs as soon as they burn out.  Set up your furniture so you have a clear path. Avoid moving your furniture around.  If any of your floors are uneven, fix them.  If there are any pets around you, be aware of where they are.  Review your medicines with your doctor. Some medicines can make you feel dizzy. This can increase your chance of falling. Ask your doctor what other things that you can do to help prevent falls. This information is not intended to replace advice given to you by your health care provider. Make sure you discuss any questions you have with your health care provider. Document Released: 10/02/2009 Document Revised: 05/13/2016 Document Reviewed: 01/10/2015 Elsevier Interactive Patient Education  2017 [employer].

## 2017-03-23 ENCOUNTER — Telehealth: Payer: Self-pay | Admitting: Cardiovascular Disease

## 2017-03-23 NOTE — Telephone Encounter (Signed)
Pt requesting eliquis 5mg  samples.  Samples of this drug were given to the patient, quantity 4 boxes, Lot Number [id number], exp date 03/2019 At front desk awaiting pick up

## 2017-03-23 NOTE — Telephone Encounter (Signed)
can you see my earlier response?  If so,  Handle it  Thank you

## 2017-03-23 NOTE — Telephone Encounter (Signed)
Patient made aware, verbalized understanding and will keep the upcoming follow up scheduled on 04/08/17.

## 2017-03-23 NOTE — Telephone Encounter (Signed)
Pt called looking for a update on this. Pt states that her spouse does not have an appt today and to please call her. Please advise, thank you!  Call pt @ [phone].

## 2017-03-24 DIAGNOSIS — M5413 Radiculopathy, cervicothoracic region: Secondary | ICD-10-CM | POA: Diagnosis not present

## 2017-03-24 DIAGNOSIS — M5412 Radiculopathy, cervical region: Secondary | ICD-10-CM | POA: Diagnosis not present

## 2017-03-24 DIAGNOSIS — M542 Cervicalgia: Secondary | ICD-10-CM | POA: Diagnosis not present

## 2017-03-24 DIAGNOSIS — M9901 Segmental and somatic dysfunction of cervical region: Secondary | ICD-10-CM | POA: Diagnosis not present

## 2017-03-24 DIAGNOSIS — M9902 Segmental and somatic dysfunction of thoracic region: Secondary | ICD-10-CM | POA: Diagnosis not present

## 2017-03-28 DIAGNOSIS — M9901 Segmental and somatic dysfunction of cervical region: Secondary | ICD-10-CM | POA: Diagnosis not present

## 2017-03-28 DIAGNOSIS — M5413 Radiculopathy, cervicothoracic region: Secondary | ICD-10-CM | POA: Diagnosis not present

## 2017-03-28 DIAGNOSIS — M5412 Radiculopathy, cervical region: Secondary | ICD-10-CM | POA: Diagnosis not present

## 2017-03-28 DIAGNOSIS — M542 Cervicalgia: Secondary | ICD-10-CM | POA: Diagnosis not present

## 2017-03-28 DIAGNOSIS — M9902 Segmental and somatic dysfunction of thoracic region: Secondary | ICD-10-CM | POA: Diagnosis not present

## 2017-03-29 ENCOUNTER — Ambulatory Visit (INDEPENDENT_AMBULATORY_CARE_PROVIDER_SITE_OTHER): Payer: Medicare Other | Admitting: Podiatry

## 2017-03-29 ENCOUNTER — Encounter: Payer: Self-pay | Admitting: Podiatry

## 2017-03-29 DIAGNOSIS — L600 Ingrowing nail: Secondary | ICD-10-CM | POA: Diagnosis not present

## 2017-03-29 DIAGNOSIS — L03039 Cellulitis of unspecified toe: Secondary | ICD-10-CM

## 2017-03-29 DIAGNOSIS — M79676 Pain in unspecified toe(s): Secondary | ICD-10-CM

## 2017-03-29 NOTE — Progress Notes (Signed)
Patient ID: [name] [name], female   DOB: April 15, 1936, 81 y.o.   MRN: [id number]    Subjective: Patient presents today for evaluation of a painful corn on the 2nd toe of the right foot that has been present for approximately 2 weeks. She also reports sudden onset second left toenail pain that began upon waking about two weeks ago. Patient is concerned for possible ingrown nail. She reports associated bleeding from the toenail. Wearing shoes increases the pain. Soaking in Epsom salt helps to alleviate the pain. Patient presents today for further treatment and evaluation.  Objective:  General: Well developed, nourished, in no acute distress, alert and oriented x3   Dermatology: Skin is warm, dry and supple bilateral. Left 2nd toe appears to be erythematous with evidence of an ingrowing nail. Pain on palpation noted to the border of the nail fold. The remaining nails appear unremarkable at this time. There are no open sores, lesions.  Vascular: Dorsalis Pedis artery and Posterior Tibial artery pedal pulses palpable. No lower extremity edema noted.   Neruologic: Grossly intact via light touch bilateral.  Musculoskeletal: Muscular strength within normal limits in all groups bilateral. Normal range of motion noted to all pedal and ankle joints.   Assesement: #1 Paronychia with ingrowing nail-2nd left toe #2 Pain in toe #3 Incurvated nail #4 Hammertoe 2-5 bilaterally #5 HAV bilaterally  Plan of Care:  1. Patient evaluated.  2. Discussed treatment alternatives and plan of care. Explained nail avulsion procedure and post procedure course to patient. 3. Patient opted for permanent total nail avulsion.  4. Prior to procedure, local anesthesia infiltration utilized using 3 ml of a 50:50 mixture of 2% plain lidocaine and 0.5% plain marcaine in a normal digital block fashion and a betadine prep performed.  5. Total permanent nail avulsion with chemical matrixectomy performed using [id number] applications  of phenol followed by alcohol flush.  6. Light dressing applied. 7. Silicone hammertoe pad dispensed for her second right 8. Post op shoe provided. 9. Return to clinic in 2 weeks.   Edrick Kins, DPM [facility]  Dr. Edrick Kins, [address]                                        [place], [place] [address]                Office [phone]  Fax [phone]

## 2017-03-29 NOTE — Progress Notes (Signed)
   Subjective:    Patient ID: [name] [name], female    DOB: August 01, 1936, 81 y.o.   MRN: [id number]  HPI    Review of Systems  Neurological: Positive for dizziness and headaches.  All other systems reviewed and are negative.      Objective:   Physical Exam        Assessment & Plan:

## 2017-03-30 DIAGNOSIS — M5412 Radiculopathy, cervical region: Secondary | ICD-10-CM | POA: Diagnosis not present

## 2017-03-30 DIAGNOSIS — M9901 Segmental and somatic dysfunction of cervical region: Secondary | ICD-10-CM | POA: Diagnosis not present

## 2017-03-30 DIAGNOSIS — M5413 Radiculopathy, cervicothoracic region: Secondary | ICD-10-CM | POA: Diagnosis not present

## 2017-03-30 DIAGNOSIS — M9902 Segmental and somatic dysfunction of thoracic region: Secondary | ICD-10-CM | POA: Diagnosis not present

## 2017-03-30 DIAGNOSIS — M542 Cervicalgia: Secondary | ICD-10-CM | POA: Diagnosis not present

## 2017-04-01 DIAGNOSIS — M5412 Radiculopathy, cervical region: Secondary | ICD-10-CM | POA: Diagnosis not present

## 2017-04-01 DIAGNOSIS — M542 Cervicalgia: Secondary | ICD-10-CM | POA: Diagnosis not present

## 2017-04-01 DIAGNOSIS — M9901 Segmental and somatic dysfunction of cervical region: Secondary | ICD-10-CM | POA: Diagnosis not present

## 2017-04-01 DIAGNOSIS — M5413 Radiculopathy, cervicothoracic region: Secondary | ICD-10-CM | POA: Diagnosis not present

## 2017-04-01 DIAGNOSIS — M9902 Segmental and somatic dysfunction of thoracic region: Secondary | ICD-10-CM | POA: Diagnosis not present

## 2017-04-04 DIAGNOSIS — R1084 Generalized abdominal pain: Secondary | ICD-10-CM | POA: Diagnosis not present

## 2017-04-04 DIAGNOSIS — R197 Diarrhea, unspecified: Secondary | ICD-10-CM | POA: Diagnosis not present

## 2017-04-08 ENCOUNTER — Encounter: Payer: Self-pay | Admitting: Internal Medicine

## 2017-04-08 ENCOUNTER — Ambulatory Visit (INDEPENDENT_AMBULATORY_CARE_PROVIDER_SITE_OTHER): Payer: Medicare Other | Admitting: Internal Medicine

## 2017-04-08 VITALS — BP 120/60 | HR 56 | Temp 97.6°F | Ht 63.0 in | Wt 161.0 lb

## 2017-04-08 DIAGNOSIS — R7301 Impaired fasting glucose: Secondary | ICD-10-CM | POA: Diagnosis not present

## 2017-04-08 DIAGNOSIS — F32A Depression, unspecified: Secondary | ICD-10-CM

## 2017-04-08 DIAGNOSIS — F419 Anxiety disorder, unspecified: Secondary | ICD-10-CM | POA: Diagnosis not present

## 2017-04-08 DIAGNOSIS — E119 Type 2 diabetes mellitus without complications: Secondary | ICD-10-CM | POA: Diagnosis not present

## 2017-04-08 DIAGNOSIS — F329 Major depressive disorder, single episode, unspecified: Secondary | ICD-10-CM | POA: Diagnosis not present

## 2017-04-08 DIAGNOSIS — I10 Essential (primary) hypertension: Secondary | ICD-10-CM | POA: Diagnosis not present

## 2017-04-08 DIAGNOSIS — R7303 Prediabetes: Secondary | ICD-10-CM

## 2017-04-08 DIAGNOSIS — E7800 Pure hypercholesterolemia, unspecified: Secondary | ICD-10-CM

## 2017-04-08 DIAGNOSIS — K219 Gastro-esophageal reflux disease without esophagitis: Secondary | ICD-10-CM | POA: Diagnosis not present

## 2017-04-08 LAB — POCT GLYCOSYLATED HEMOGLOBIN (HGB A1C): Hemoglobin A1C: 5.7

## 2017-04-08 LAB — MICROALBUMIN / CREATININE URINE RATIO
Creatinine,U: 39.2 mg/dL
Microalb Creat Ratio: 1.8 mg/g (ref 0.0–30.0)
Microalb, Ur: <0.7 mg/dL (ref 0.0–1.9)

## 2017-04-08 MED ORDER — TRAMADOL HCL 50 MG PO TABS: 50.0000 mg | ORAL_TABLET | Freq: Three times a day (TID) | ORAL | 0 refills | Status: DC | PRN

## 2017-04-08 NOTE — Progress Notes (Signed)
Pre visit review using our clinic review tool, if applicable. No additional management support is needed unless otherwise documented below in the visit note. 
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
## 2017-04-08 NOTE — Patient Instructions (Signed)
Your  fasting glucose  In February was 155 and theregfore diagnostic of diabetes; I am checking your A1c  To see if you need to start medication

## 2017-04-10 NOTE — Assessment & Plan Note (Addendum)
Fasting glucose was 155 in Feb, but her a1c remains below the prediabetes range. .  Lab Results  Component Value Date   HGBA1C 5.7 04/08/2017

## 2017-04-10 NOTE — Assessment & Plan Note (Addendum)
Patient reports improvement in burping and chest disconfort. Recently had a cardiac evaluation to rule out cardiac cause. Continue daily use of Protonix and probiotic

## 2017-04-10 NOTE — Assessment & Plan Note (Signed)
Tolerating  Pravastatin. LDL and triglycerides are at goal on current medications. sHe has no side effects and liver enzymes are normal. No changes today   Lab Results  Component Value Date   CHOL 169 01/24/2017   HDL 66.60 01/24/2017   LDLCALC 85 01/24/2017   TRIG 90.0 01/24/2017   CHOLHDL 3 01/24/2017   Lab Results  Component Value Date   ALT 17 02/03/2017   AST 28 02/03/2017   ALKPHOS 64 02/03/2017   BILITOT 0.8 02/03/2017
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
## 2017-04-11 ENCOUNTER — Telehealth: Payer: Self-pay | Admitting: *Deleted

## 2017-04-11 NOTE — Telephone Encounter (Signed)
Documented in result note.  

## 2017-04-11 NOTE — Telephone Encounter (Signed)
Pt requested lab results  Pt contact [phone]

## 2017-04-12 ENCOUNTER — Other Ambulatory Visit: Payer: Self-pay | Admitting: Internal Medicine

## 2017-04-15 ENCOUNTER — Ambulatory Visit (INDEPENDENT_AMBULATORY_CARE_PROVIDER_SITE_OTHER): Payer: Medicare Other | Admitting: Podiatry

## 2017-04-15 DIAGNOSIS — M79676 Pain in unspecified toe(s): Secondary | ICD-10-CM

## 2017-04-15 DIAGNOSIS — S91109D Unspecified open wound of unspecified toe(s) without damage to nail, subsequent encounter: Secondary | ICD-10-CM

## 2017-04-15 DIAGNOSIS — S91209D Unspecified open wound of unspecified toe(s) with damage to nail, subsequent encounter: Secondary | ICD-10-CM

## 2017-04-16 NOTE — Progress Notes (Signed)
   Subjective: Patient presents today 2 weeks post ingrown nail permanent nail avulsion procedure. Patient states that the toe and nail fold is feeling much better. She denies any drainage, redness or soreness.  Objective: Skin is warm, dry and supple. Nail and respective nail fold appears to be healing appropriately. Open wound to the associated nail fold with a granular wound base and moderate amount of fibrotic tissue. Minimal drainage noted. Mild erythema around the periungual region likely due to phenol chemical matricectomy.  Assessment: #1 postop permanent total nail avulsion left second toenail  #2 open wound periungual nail fold of respective digit.   Plan of care: #1 patient was evaluated  #2 debridement of open wound was performed to the periungual border of the respective toe using a currette. Antibiotic ointment and Band-Aid was applied. #3 patient is to return to clinic on a PRN  basis.   Edrick Kins, DPM [facility]  Dr. Edrick Kins, [address]                                        [place], [place] [address]                Office [phone]  Fax [phone]

## 2017-04-20 ENCOUNTER — Telehealth: Payer: Self-pay | Admitting: Cardiovascular Disease

## 2017-04-20 NOTE — Telephone Encounter (Signed)
Pt reports HR low 50s for the past week. Normally runs upper 62s Had Feb DCCV. Amiodarone dosage changed to 200mg  once daily. At f/u OV in March, pt instructed to continue same amiodarone dosage. Pt thinks Dr. Fletcher Anon mentioned he could decrease dosage a few months after DCCV. She asks if it is acceptable to take 100mg  QD.  Routed to MD for advice.

## 2017-04-20 NOTE — Telephone Encounter (Signed)
Patient was cardioverted and put on amiodarone. Patient says hr is recently in the 20's and wants to know if she needs to continue taking amiodarone at this dose.

## 2017-04-21 MED ORDER — AMIODARONE HCL 100 MG PO TABS
100.0000 mg | ORAL_TABLET | Freq: Every day | ORAL | 3 refills | Status: DC
Start: 2017-04-21 — End: 2017-07-18

## 2017-04-21 NOTE — Telephone Encounter (Signed)
No answer. Left message to call back.  Order for amiodarone 100 mg by mouth once a day sent to pharmacy.

## 2017-04-21 NOTE — Telephone Encounter (Signed)
Received incoming call from patient. She verbalized understanding to take Amiodarone 100 mg by mouth once a day. She will check with her pharmacy to see if the Rx went though for the 100mg  tablets and will call us back if needed.

## 2017-04-21 NOTE — Telephone Encounter (Signed)
Decrease Amiodarone to 100 mg once daily.

## 2017-04-22 ENCOUNTER — Ambulatory Visit: Payer: Self-pay | Admitting: Internal Medicine

## 2017-04-22 ENCOUNTER — Telehealth: Payer: Self-pay | Admitting: Internal Medicine

## 2017-04-22 DIAGNOSIS — H8309 Labyrinthitis, unspecified ear: Secondary | ICD-10-CM | POA: Diagnosis not present

## 2017-04-22 DIAGNOSIS — H66002 Acute suppurative otitis media without spontaneous rupture of ear drum, left ear: Secondary | ICD-10-CM | POA: Diagnosis not present

## 2017-04-22 DIAGNOSIS — J019 Acute sinusitis, unspecified: Secondary | ICD-10-CM | POA: Diagnosis not present

## 2017-04-22 DIAGNOSIS — B9689 Other specified bacterial agents as the cause of diseases classified elsewhere: Secondary | ICD-10-CM | POA: Diagnosis not present

## 2017-04-22 NOTE — Telephone Encounter (Signed)
Pt called about having left jaw tingling and swelling. No nausea. Bp is 137/67 and HR 65. Pt was transferred to [facility]. Thank you!

## 2017-04-22 NOTE — Telephone Encounter (Signed)
Reason for call: left jaw swelling, left facial swelling  Symptoms: left jaw swelling ,swelling in left side of face,  tingling at ear in left ear, once in while, no slurred talking fine, no numbness in arm, no facial droop, no arm numbness, no nausea, no chest pain BP 120/58, HR 51, HR 40 this week addressed by cardiologist Duration yesterday  Medications: CO-10 new medications  Last seen for this problem: Seen by: Patient is in agreement in going [employer] in Clinic today

## 2017-04-22 NOTE — Telephone Encounter (Signed)
Left message for patient to return call to office. 

## 2017-04-22 NOTE — Telephone Encounter (Signed)
Patient Name: [name] [name] DOB: Apr 13, 1936 Initial Comment caller states she has left jaw tingling an swelling. Blood pressure 137/67 heartrate is 65 . No nausea Nurse Assessment Nurse: Cherie Dark, RN, Jarrett Soho Date/Time (Eastern Time): 04/22/2017 8:34:27 AM Confirm and document reason for call. If symptomatic, describe symptoms. ---Caller states she has some neck pain, jaw pain and swelling and her ears are hurting. Thinks she might have an ear infection. Doesn't think it is a stroke. No numbness and tingling any other parts of her body. Pain in her face, neck, and ear. Does the patient have any new or worsening symptoms? ---Yes Will a triage be completed? ---Yes Related visit to physician within the last 2 weeks? ---No Does the PT have any chronic conditions? (i.e. diabetes, asthma, etc.) ---Yes List chronic conditions. ---HTN, A-fib, shocked back into rhythm about 2 months ago Is this a behavioral health or substance abuse call? ---No Guidelines Guideline Title Affirmed Question Affirmed Notes Face Pain Swelling around the eye Final Disposition User See Physician within 4 Hours (or PCP triage) Cherie Dark, RN, Jarrett Soho Comments patient did not want to see Dr. Silvio Pate today and now just wants to wait and see what happens after she takes some Tylenol. Encouarged pt. to call back PRN. Referrals REFERRED TO PCP OFFICE Disagree/Comply: Comply

## 2017-04-25 ENCOUNTER — Ambulatory Visit: Payer: Medicare Other | Admitting: Internal Medicine

## 2017-05-25 DIAGNOSIS — I10 Essential (primary) hypertension: Secondary | ICD-10-CM | POA: Diagnosis not present

## 2017-05-25 DIAGNOSIS — N183 Chronic kidney disease, stage 3 (moderate): Secondary | ICD-10-CM | POA: Diagnosis not present

## 2017-05-25 LAB — BASIC METABOLIC PANEL
BUN: 21 (ref 4–21)
Creatinine: 1.2 — AB (ref 0.5–1.1)
Glucose: 68
Potassium: 4.6 (ref 3.4–5.3)
Sodium: 138 (ref 137–147)

## 2017-05-25 LAB — MICROALBUMIN, URINE: Microalb, Ur: 8.3

## 2017-05-26 ENCOUNTER — Encounter: Payer: Self-pay | Admitting: Cardiovascular Disease

## 2017-05-26 ENCOUNTER — Ambulatory Visit (INDEPENDENT_AMBULATORY_CARE_PROVIDER_SITE_OTHER): Payer: Medicare Other | Admitting: Cardiovascular Disease

## 2017-05-26 VITALS — BP 130/60 | HR 50 | Ht 64.0 in | Wt 156.0 lb

## 2017-05-26 DIAGNOSIS — I10 Essential (primary) hypertension: Secondary | ICD-10-CM

## 2017-05-26 DIAGNOSIS — I481 Persistent atrial fibrillation: Secondary | ICD-10-CM | POA: Diagnosis not present

## 2017-05-26 DIAGNOSIS — I4819 Other persistent atrial fibrillation: Secondary | ICD-10-CM

## 2017-05-26 NOTE — Patient Instructions (Signed)
Medication Instructions: Continue same medications.   Labwork: None.   Procedures/Testing: None.   Follow-Up: 6 months with Dr. Khyrie Masi.   Any Additional Special Instructions Will Be Listed Below (If Applicable).     If you need a refill on your cardiac medications before your next appointment, please call your pharmacy.   

## 2017-05-26 NOTE — Progress Notes (Signed)
Cardiology Office Note   Date:  05/26/2017   ID:  [name] [name], DOB 07/29/1936, MRN [id number]  PCP:  [name], MD  Cardiologist:   [name], MD   Chief Complaint  Patient presents with  . other    3 month follow up. Patient deneis chest pain and SOB. Meds reviewed verbally with patient.       History of Present Illness: [name] [name] is a 81 y.o. female who presents for a followup visit regarding persistent atrial fibrillation.   She has known history of persistent atrial fibrillation/flutter status post multiple cardioversions in the past. 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 She had cardiac catheterization twice in the past without significant coronary artery disease.  Most recent cardioversion was in Feb of 2018.  Most recent Echocardiogram in November 2016 showed normal LV systolic function with mildly dilated left atrium, mild mitral regurgitation, and mild to moderate pulmonary hypertension.  She had intermittent Mobitz 2 AV block which resolved after decreasing the dose of amiodarone to 100 mg once daily.  She is on long-term anticoagulation with Eliquis.   She has been doing reasonably well. She called our office last month due to bradycardia with heart rate going in the 40s. Thus, we decreased the dose of amiodarone back to 100 mg once daily after it was increased to 1200 mg daily post cardioversion. She has been doing well since then with no chest pain, shortness of breath or palpitations.   Past Medical History:  Diagnosis Date  . Arthritis   . Atrial flutter ([place]) 02/2011   s/p cardioversion   . Cancer (HCC)    BREAST  . Chronic kidney disease    acute renal failure secondary to dehydration which is now resolved  . Cystocele   . GERD (gastroesophageal reflux disease)   . Headache(784.0)    chronic  . Hyperlipidemia   . Hypertension   . Knee fracture   . Light headedness    due to dehydration  .  Mobitz type 2 second degree atrioventricular block    a. felt to be 2/2 amiodarone, resolved with decreased amiodarone dose  . Persistent atrial fibrillation ([place])    a. status post multiple DCCV, most recently in 07/2013  . PONV (postoperative nausea and vomiting)    oxycodone and codiene cause N/V   . Pre-syncope   . 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 Venous insufficiency   . Vertigo     Past Surgical History:  Procedure Laterality Date  . ABDOMINAL HYSTERECTOMY  1990  . APPENDECTOMY    . AUGMENTATION MAMMAPLASTY Bilateral 1986   implants  . AUGMENTATION MAMMAPLASTY  1990  . AUGMENTATION MAMMAPLASTY  2011  . CARDIAC CATHETERIZATION    . CARDIOVERSION     x 3  . CARDIOVERSION    . CARDIOVERSION N/A 02/07/2017   Procedure: CARDIOVERSION;  Surgeon: Wellington Hampshire, MD;  Location: [facility] ORS;  Service: Cardiovascular;  Laterality: N/A;  . CATARACT EXTRACTION W/PHACO Right 09/21/2016   Procedure: CATARACT EXTRACTION PHACO AND INTRAOCULAR LENS PLACEMENT ([place]);  Surgeon: Birder Robson, MD;  Location: [facility] ORS;  Service: Ophthalmology;  Laterality: Right;  [place] 44.1 AP% 16.5 CDE 7.30 Fluid Pack Lot #[id number]  . CATARACT EXTRACTION W/PHACO Left 10/19/2016   Procedure: CATARACT EXTRACTION PHACO AND INTRAOCULAR LENS PLACEMENT (I[facility]);  Surgeon: Birder Robson, MD;  Location: [facility] ORS;  Service: Ophthalmology;  Laterality: Left;  [place] 53.7 AP% 19.5 CDE 10.45 Fluid pack  lot # [id number]  . CHOLECYSTECTOMY    . COMBINED AUGMENTATION MAMMAPLASTY AND ABDOMINOPLASTY    . JOINT REPLACEMENT Left 06/04/2013   left knee  . KNEE ARTHROSCOPY Right 08/16/2016   Procedure: ARTHROSCOPY KNEE, tear posterior horn medial meniscus, tear anterior and posterior horns of lateral meniscus, chondromalacia of lateral compartment grade 3 patella and grade 4 medial;  Surgeon: Dereck Leep, MD;  Location: [facility] ORS;  Service: Orthopedics;  Laterality: Right;  . MASTECTOMY  1986   Bilateral with silicone   breast implants, s/p saline replacements  . Multiple orthopedic procedures    . NOSE SURGERY    . TOTAL KNEE ARTHROPLASTY Left      Current Outpatient Prescriptions  Medication Sig Dispense Refill  . acetaminophen (TYLENOL) 500 MG tablet Take 1,000 mg by mouth every 6 (six) hours as needed (pain).     [name] aluminum-magnesium hydroxide 200-200 MG/5ML suspension Take 20 mLs by mouth every 6 (six) hours as needed for indigestion.     [name] amiodarone (PACERONE) 100 MG tablet Take 1 tablet (100 mg total) by mouth daily. 90 tablet 3  . amLODipine (NORVASC) 2.5 MG tablet Take 1-2 tablets (2.5-5 mg total) by mouth 2 (two) times daily. 2.5 in am and 5mg  in the evening 180 tablet 3  . apixaban (ELIQUIS) 5 MG TABS tablet Take 1 tablet (5 mg total) by mouth 2 (two) times daily. 60 tablet 6  . cetirizine (ZYRTEC) 10 MG tablet Take 10 mg by mouth daily as needed for allergies.    [name] ergocalciferol (DRISDOL) 50000 units capsule Take 1 capsule (50,000 Units total) by mouth once a week. (Patient taking differently: Take 1,000 Units by mouth daily. ) 4 capsule 2  . fluticasone (FLONASE) 50 MCG/ACT nasal spray Place 2 sprays into both nostrils daily. 16 g 0  . furosemide (LASIX) 20 MG tablet Take 1 tablet (20 mg total) by mouth daily. As needed for edema not more than every other day 45 tablet 1  . pantoprazole (PROTONIX) 40 MG tablet Take 1 tablet (40 mg total) by mouth daily. 30 tablet 11  . pravastatin (PRAVACHOL) 20 MG tablet TAKE ONE (1) TABLET BY MOUTH EVERY DAY 90 tablet 3  . Probiotic Product (PROBIOTIC ADVANCED PO) Take 1 tablet by mouth daily.    . sodium chloride (OCEAN) 0.65 % SOLN nasal spray Place 1 spray into both nostrils 2 (two) times daily as needed for congestion.     . traMADol (ULTRAM) 50 MG tablet Take 1 tablet (50 mg total) by mouth every 8 (eight) hours as needed. 30 tablet 0   No current facility-administered medications for this visit.     Allergies:   Iodine; Biaxin [clarithromycin];  Codeine; Enalapril; Fluocinonide; Oxycodone; Promethazine; Warfarin and related; and Xarelto [rivaroxaban]    Social History:  The patient  reports that she has never smoked. She has never used smokeless tobacco. She reports that she does not drink alcohol or use drugs.   Family History:  The patient's family history includes Breast cancer in her maternal grandmother, sister, sister, and sister; Cancer in her father, maternal grandmother, sister, sister, and sister; Heart disease in her mother and son.    ROS:  Please see the history of present illness.   Otherwise, review of systems are positive for none.   All other systems are reviewed and negative.    PHYSICAL EXAM: VS:  BP 130/60 (BP Location: Left Arm, Patient Position: Sitting, Cuff Size: Normal)   Pulse (!) 50  Ht 5\' 4"  (1.626 m)   Wt 156 lb (70.8 kg)   BMI 26.78 kg/m  , BMI Body mass index is 26.78 kg/m. GEN: Well nourished, well developed, in no acute distress  HEENT: normal  Neck: no JVD, carotid bruits, or masses Cardiac: RRR; no murmurs, rubs, or gallops,no edema  Respiratory:  clear to auscultation bilaterally, normal work of breathing GI: soft, nontender, nondistended, + BS MS: no deformity or atrophy  Skin: warm and dry, no rash Neuro:  Strength and sensation are intact Psych: euthymic mood, full affect   EKG:  EKG is ordered today. The ekg ordered today demonstrates  sinus bradycardia with a heart rate of 50 bpm, right bundle branch block. Inferolateral T wave changes suggestive of ischemia.  Recent Labs: 01/24/2017: TSH 2.72 02/03/2017: ALT 17; BUN 20; Creatinine, Ser 1.00; Hemoglobin 14.0; Platelets 236; Potassium 3.9; Sodium 138    Lipid Panel    Component Value Date/Time   CHOL 169 01/24/2017 0823   TRIG 90.0 01/24/2017 0823   HDL 66.60 01/24/2017 0823   CHOLHDL 3 01/24/2017 0823   VLDL 18.0 01/24/2017 0823   LDLCALC 85 01/24/2017 0823      Wt Readings from Last 3 Encounters:  05/26/17 156 lb  (70.8 kg)  04/08/17 161 lb (73 kg)  03/22/17 159 lb (72.1 kg)       ASSESSMENT AND PLAN:  1.  Persistent atrial fibrillation:  She is maintaining in sinus rhythm on small dose amiodarone 100 mg once daily. I made no changes today. Bradycardia improved after decreasing the dose of amiodarone. She is tolerating anticoagulation with Eliquis.  2. Essential hypertension: Blood pressure is well controlled on current medications.  3. History of tachycardia-induced cardiomyopathy. Most recent ejection fraction was normal .   Disposition:   FU with me in 6 months  Signed,  [name], MD  05/26/2017 11:34 AM    [place]

## 2017-05-27 ENCOUNTER — Other Ambulatory Visit: Payer: Self-pay

## 2017-06-01 DIAGNOSIS — S40811A Abrasion of right upper arm, initial encounter: Secondary | ICD-10-CM | POA: Diagnosis not present

## 2017-06-08 ENCOUNTER — Encounter: Payer: Self-pay | Admitting: Obstetrics and Gynecology

## 2017-06-08 ENCOUNTER — Ambulatory Visit (INDEPENDENT_AMBULATORY_CARE_PROVIDER_SITE_OTHER): Payer: Medicare Other | Admitting: Obstetrics and Gynecology

## 2017-06-08 VITALS — BP 109/56 | HR 61 | Ht 64.0 in | Wt 158.0 lb

## 2017-06-08 DIAGNOSIS — Z853 Personal history of malignant neoplasm of breast: Secondary | ICD-10-CM

## 2017-06-08 DIAGNOSIS — Z780 Asymptomatic menopausal state: Secondary | ICD-10-CM

## 2017-06-08 DIAGNOSIS — Z1211 Encounter for screening for malignant neoplasm of colon: Secondary | ICD-10-CM | POA: Diagnosis not present

## 2017-06-08 DIAGNOSIS — L989 Disorder of the skin and subcutaneous tissue, unspecified: Secondary | ICD-10-CM

## 2017-06-08 DIAGNOSIS — N952 Postmenopausal atrophic vaginitis: Secondary | ICD-10-CM | POA: Diagnosis not present

## 2017-06-08 NOTE — Patient Instructions (Signed)
1. No Pap smear needed 2. Mammogram through primary care 3. Stool guaiac cards are given for colon cancer screening 4. Referral to dermatology for evaluation of left lower leg skin lesion 5. Recommend calcium and vitamin D supplementation daily 6. Return in 1 year   Health Maintenance for Postmenopausal Women Menopause is a normal process in which your reproductive ability comes to an end. This process happens gradually over a span of months to years, usually between the ages of 74 and 34. Menopause is complete when you have missed 12 consecutive menstrual periods. It is important to talk with your health care provider about some of the most common conditions that affect postmenopausal women, such as heart disease, cancer, and bone loss (osteoporosis). Adopting a healthy lifestyle and getting preventive care can help to promote your health and wellness. Those actions can also lower your chances of developing some of these common conditions. What should I know about menopause? During menopause, you may experience a number of symptoms, such as:  Moderate-to-severe hot flashes.  Night sweats.  Decrease in sex drive.  Mood swings.  Headaches.  Tiredness.  Irritability.  Memory problems.  Insomnia.  Choosing to treat or not to treat menopausal changes is an individual decision that you make with your health care provider. What should I know about hormone replacement therapy and supplements? Hormone therapy products are effective for treating symptoms that are associated with menopause, such as hot flashes and night sweats. Hormone replacement carries certain risks, especially as you become older. If you are thinking about using estrogen or estrogen with progestin treatments, discuss the benefits and risks with your health care provider. What should I know about heart disease and stroke? Heart disease, heart attack, and stroke become more likely as you age. This may be due, in part, to  the hormonal changes that your body experiences during menopause. These can affect how your body processes dietary fats, triglycerides, and cholesterol. Heart attack and stroke are both medical emergencies. There are many things that you can do to help prevent heart disease and stroke:  Have your blood pressure checked at least every 1-2 years. High blood pressure causes heart disease and increases the risk of stroke.  If you are 58-66 years old, ask your health care provider if you should take aspirin to prevent a heart attack or a stroke.  Do not use any tobacco products, including cigarettes, chewing tobacco, or electronic cigarettes. If you need help quitting, ask your health care provider.  It is important to eat a healthy diet and maintain a healthy weight. ? Be sure to include plenty of vegetables, fruits, low-fat dairy products, and lean protein. ? Avoid eating foods that are high in solid fats, added sugars, or salt (sodium).  Get regular exercise. This is one of the most important things that you can do for your health. ? Try to exercise for at least 150 minutes each week. The type of exercise that you do should increase your heart rate and make you sweat. This is known as moderate-intensity exercise. ? Try to do strengthening exercises at least twice each week. Do these in addition to the moderate-intensity exercise.  Know your numbers.Ask your health care provider to check your cholesterol and your blood glucose. Continue to have your blood tested as directed by your health care provider.  What should I know about cancer screening? There are several types of cancer. Take the following steps to reduce your risk and to catch any cancer development  as early as possible. Breast Cancer  Practice breast self-awareness. ? This means understanding how your breasts normally appear and feel. ? It also means doing regular breast self-exams. Let your health care provider know about any  changes, no matter how small.  If you are 100 or older, have a clinician do a breast exam (clinical breast exam or CBE) every year. Depending on your age, family history, and medical history, it may be recommended that you also have a yearly breast X-ray (mammogram).  If you have a family history of breast cancer, talk with your health care provider about genetic screening.  If you are at high risk for breast cancer, talk with your health care provider about having an MRI and a mammogram every year.  Breast cancer (BRCA) gene test is recommended for women who have family members with BRCA-related cancers. Results of the assessment will determine the need for genetic counseling and BRCA1 and for BRCA2 testing. BRCA-related cancers include these types: ? Breast. This occurs in males or females. ? Ovarian. ? Tubal. This may also be called fallopian tube cancer. ? Cancer of the abdominal or pelvic lining (peritoneal cancer). ? Prostate. ? Pancreatic.  Cervical, Uterine, and Ovarian Cancer Your health care provider may recommend that you be screened regularly for cancer of the pelvic organs. These include your ovaries, uterus, and vagina. This screening involves a pelvic exam, which includes checking for microscopic changes to the surface of your cervix (Pap test).  For women ages 21-65, health care providers may recommend a pelvic exam and a Pap test every three years. For women ages 84-65, they may recommend the Pap test and pelvic exam, combined with testing for human papilloma virus (HPV), every five years. Some types of HPV increase your risk of cervical cancer. Testing for HPV may also be done on women of any age who have unclear Pap test results.  Other health care providers may not recommend any screening for nonpregnant women who are considered low risk for pelvic cancer and have no symptoms. Ask your health care provider if a screening pelvic exam is right for you.  If you have had past  treatment for cervical cancer or a condition that could lead to cancer, you need Pap tests and screening for cancer for at least 20 years after your treatment. If Pap tests have been discontinued for you, your risk factors (such as having a new sexual partner) need to be reassessed to determine if you should start having screenings again. Some women have medical problems that increase the chance of getting cervical cancer. In these cases, your health care provider may recommend that you have screening and Pap tests more often.  If you have a family history of uterine cancer or ovarian cancer, talk with your health care provider about genetic screening.  If you have vaginal bleeding after reaching menopause, tell your health care provider.  There are currently no reliable tests available to screen for ovarian cancer.  Lung Cancer Lung cancer screening is recommended for adults 51-78 years old who are at high risk for lung cancer because of a history of smoking. A yearly low-dose CT scan of the lungs is recommended if you:  Currently smoke.  Have a history of at least 30 pack-years of smoking and you currently smoke or have quit within the past 15 years. A pack-year is smoking an average of one pack of cigarettes per day for one year.  Yearly screening should:  Continue until it has  been 15 years since you quit.  Stop if you develop a health problem that would prevent you from having lung cancer treatment.  Colorectal Cancer  This type of cancer can be detected and can often be prevented.  Routine colorectal cancer screening usually begins at age 29 and continues through age 1.  If you have risk factors for colon cancer, your health care provider may recommend that you be screened at an earlier age.  If you have a family history of colorectal cancer, talk with your health care provider about genetic screening.  Your health care provider may also recommend using home test kits to check  for hidden blood in your stool.  A small camera at the end of a tube can be used to examine your colon directly (sigmoidoscopy or colonoscopy). This is done to check for the earliest forms of colorectal cancer.  Direct examination of the colon should be repeated every 5-10 years until age 63. However, if early forms of precancerous polyps or small growths are found or if you have a family history or genetic risk for colorectal cancer, you may need to be screened more often.  Skin Cancer  Check your skin from head to toe regularly.  Monitor any moles. Be sure to tell your health care provider: ? About any new moles or changes in moles, especially if there is a change in a mole's shape or color. ? If you have a mole that is larger than the size of a pencil eraser.  If any of your family members has a history of skin cancer, especially at a young age, talk with your health care provider about genetic screening.  Always use sunscreen. Apply sunscreen liberally and repeatedly throughout the day.  Whenever you are outside, protect yourself by wearing long sleeves, pants, a wide-brimmed hat, and sunglasses.  What should I know about osteoporosis? Osteoporosis is a condition in which bone destruction happens more quickly than new bone creation. After menopause, you may be at an increased risk for osteoporosis. To help prevent osteoporosis or the bone fractures that can happen because of osteoporosis, the following is recommended:  If you are 76-49 years old, get at least 1,000 mg of calcium and at least 600 mg of vitamin D per day.  If you are older than age 76 but younger than age 74, get at least 1,200 mg of calcium and at least 600 mg of vitamin D per day.  If you are older than age 81, get at least 1,200 mg of calcium and at least 800 mg of vitamin D per day.  Smoking and excessive alcohol intake increase the risk of osteoporosis. Eat foods that are rich in calcium and vitamin D, and do  weight-bearing exercises several times each week as directed by your health care provider. What should I know about how menopause affects my mental health? Depression may occur at any age, but it is more common as you become older. Common symptoms of depression include:  Low or sad mood.  Changes in sleep patterns.  Changes in appetite or eating patterns.  Feeling an overall lack of motivation or enjoyment of activities that you previously enjoyed.  Frequent crying spells.  Talk with your health care provider if you think that you are experiencing depression. What should I know about immunizations? It is important that you get and maintain your immunizations. These include:  Tetanus, diphtheria, and pertussis (Tdap) booster vaccine.  Influenza every year before the flu season begins.  Pneumonia vaccine.  Shingles vaccine.  Your health care provider may also recommend other immunizations. This information is not intended to replace advice given to you by your health care provider. Make sure you discuss any questions you have with your health care provider. Document Released: 01/28/2006 Document Revised: 06/25/2016 Document Reviewed: 09/09/2015 Elsevier Interactive Patient Education  2018 [employer]vier Inc.  

## 2017-06-08 NOTE — Progress Notes (Signed)
reportPatient ID: [name] [name], female   DOB: [birth year], 81 y.o.   MRN: [id number] ANNUAL PREVENTATIVE CARE GYN  ENCOUNTER NOTE  Subjective:       [name] [name] is a 81 y.o. G44P2002 female here for a routine annual gynecologic exam.  Patient reports mild increase in fatigue and some weight gain that she attributes to her knee surgery. She has no current gynecological complaints.  Current complaints: 1. Right sided abdominal pain x 1 month.  No acute trauma or injury reported, pain is local and non radiating.  She states the rest and a heating pad seem to alleviate pain.  Significant abdominal surgeries: vertical incision for hysterectomy, R side hernia repair with appendectomy.   Bowel and bladder function are normal.   Gynecologic History No LMP recorded. Patient has had a hysterectomy. Status post TAH/BSO  Contraception: status post hysterectomy Last Pap: unsure. Results were: normal Last mammogram: 2017 wnl. Results were: normal  Obstetric History OB History  Gravida Para Term Preterm AB Living  2 2 2     2   SAB TAB Ectopic Multiple Live Births          2    # Outcome Date GA Lbr Len/2nd Weight Sex Delivery Anes PTL Lv  2 Term 1964    M VBAC   LIV  1 Term 1962    M Vag-Spont   LIV      Past Medical History:  Diagnosis Date  . Arthritis   . Atrial flutter ([place]) 02/2011   s/p cardioversion   . Cancer (HCC)    BREAST  . Chronic kidney disease    acute renal failure secondary to dehydration which is now resolved  . Cystocele   . GERD (gastroesophageal reflux disease)   . Headache(784.0)    chronic  . Hyperlipidemia   . Hypertension   . Knee fracture   . Light headedness    due to dehydration  . Mobitz type 2 second degree atrioventricular block    a. felt to be 2/2 amiodarone, resolved with decreased amiodarone dose  . Persistent atrial fibrillation ([place])    a. status post multiple DCCV, most recently in 07/2013  . PONV (postoperative nausea and vomiting)     oxycodone and codiene cause N/V   . Pre-syncope   . Tachycardia induced cardiomyopathy ([place])   . Venous insufficiency   . Vertigo     Past Surgical History:  Procedure Laterality Date  . ABDOMINAL HYSTERECTOMY  1990  . APPENDECTOMY    . AUGMENTATION MAMMAPLASTY Bilateral 1986   implants  . AUGMENTATION MAMMAPLASTY  1990  . AUGMENTATION MAMMAPLASTY  2011  . CARDIAC CATHETERIZATION    . CARDIOVERSION     x 3  . CARDIOVERSION    . CARDIOVERSION N/A 02/07/2017   Procedure: CARDIOVERSION;  Surgeon: Wellington Hampshire, MD;  Location: [facility] ORS;  Service: Cardiovascular;  Laterality: N/A;  . CATARACT EXTRACTION W/PHACO Right 09/21/2016   Procedure: CATARACT EXTRACTION PHACO AND INTRAOCULAR LENS PLACEMENT ([place]);  Surgeon: Birder Robson, MD;  Location: [facility] ORS;  Service: Ophthalmology;  Laterality: Right;  [place] 44.1 AP% 16.5 CDE 7.30 Fluid Pack Lot #[id number]  . CATARACT EXTRACTION W/PHACO Left 10/19/2016   Procedure: CATARACT EXTRACTION PHACO AND INTRAOCULAR LENS PLACEMENT (IOC);  Surgeon: Birder Robson, MD;  Location: [facility] ORS;  Service: Ophthalmology;  Laterality: Left;  [place] 53.7 AP% 19.5 CDE 10.45 Fluid pack lot # [id number]  . CHOLECYSTECTOMY    . COMBINED AUGMENTATION MAMMAPLASTY AND ABDOMINOPLASTY    .  JOINT REPLACEMENT Left 06/04/2013   left knee  . KNEE ARTHROSCOPY Right 08/16/2016   Procedure: ARTHROSCOPY KNEE, tear posterior horn medial meniscus, tear anterior and posterior horns of lateral meniscus, chondromalacia of lateral compartment grade 3 patella and grade 4 medial;  Surgeon: Dereck Leep, MD;  Location: [facility] ORS;  Service: Orthopedics;  Laterality: Right;  . MASTECTOMY  1986   Bilateral with silicone  breast implants, s/p saline replacements  . Multiple orthopedic procedures    . NOSE SURGERY    . TOTAL KNEE ARTHROPLASTY Left     Current Outpatient Prescriptions on File Prior to Visit  Medication Sig Dispense Refill  . acetaminophen (TYLENOL) 500 MG tablet Take  1,000 mg by mouth every 6 (six) hours as needed (pain).     [name] aluminum-magnesium hydroxide 200-200 MG/5ML suspension Take 20 mLs by mouth every 6 (six) hours as needed for indigestion.     [name] amiodarone (PACERONE) 100 MG tablet Take 1 tablet (100 mg total) by mouth daily. 90 tablet 3  . amLODipine (NORVASC) 2.5 MG tablet Take 1-2 tablets (2.5-5 mg total) by mouth 2 (two) times daily. 2.5 in am and 5mg  in the evening 180 tablet 3  . apixaban (ELIQUIS) 5 MG TABS tablet Take 1 tablet (5 mg total) by mouth 2 (two) times daily. 60 tablet 6  . ergocalciferol (DRISDOL) 50000 units capsule Take 1 capsule (50,000 Units total) by mouth once a week. (Patient taking differently: Take 1,000 Units by mouth daily. ) 4 capsule 2  . fluticasone (FLONASE) 50 MCG/ACT nasal spray Place 2 sprays into both nostrils daily. 16 g 0  . furosemide (LASIX) 20 MG tablet Take 1 tablet (20 mg total) by mouth daily. As needed for edema not more than every other day 45 tablet 1  . pantoprazole (PROTONIX) 40 MG tablet Take 1 tablet (40 mg total) by mouth daily. 30 tablet 11  . pravastatin (PRAVACHOL) 20 MG tablet TAKE ONE (1) TABLET BY MOUTH EVERY DAY 90 tablet 3  . Probiotic Product (PROBIOTIC ADVANCED PO) Take 1 tablet by mouth daily.    . sodium chloride (OCEAN) 0.65 % SOLN nasal spray Place 1 spray into both nostrils 2 (two) times daily as needed for congestion.     . traMADol (ULTRAM) 50 MG tablet Take 1 tablet (50 mg total) by mouth every 8 (eight) hours as needed. 30 tablet 0   No current facility-administered medications on file prior to visit.     Allergies  Allergen Reactions  . Iodine Anaphylaxis    NO PROBLEMS WITH BETADINE  . Biaxin [Clarithromycin] Nausea And Vomiting  . Codeine Nausea And Vomiting  . Enalapril Other (See Comments)    unknown  . Fluocinonide Other (See Comments)    Tingling sensation in head and redness to scalp.  . Oxycodone Nausea And Vomiting  . Promethazine Other (See Comments)     Unknown   . Warfarin And Related   . Xarelto [Rivaroxaban] Other (See Comments)    Unknown     Social History   Social History  . Marital status: Married    Spouse name: N/A  . Number of children: 1  . Years of education: N/A   Occupational History  .  Retired   Social History Main Topics  . Smoking status: Never Smoker  . Smokeless tobacco: Never Used  . Alcohol use No  . Drug use: No  . Sexual activity: No   Other Topics Concern  . Not on file  Social History Narrative  . No narrative on file    Family History  Problem Relation Age of Onset  . Heart disease Mother   . Cancer Father        stomach  . Heart disease Son        found at autopsy  . Cancer Sister        breast  . Breast cancer Sister   . Cancer Maternal Grandmother        breast  . Breast cancer Maternal Grandmother   . Cancer Sister        breast  . Breast cancer Sister   . Breast cancer Sister   . Cancer Sister        breast  . Diabetes Neg Hx     The following portions of the patient's history were reviewed and updated as appropriate: allergies, current medications, past family history, past medical history, past social history, past surgical history and problem list.  Review of Systems Review of Systems  Constitutional: Positive for malaise/fatigue. Negative for chills, fever and weight loss.  HENT: Negative.   Eyes: Negative.   Respiratory: Negative.   Cardiovascular: Negative.   Gastrointestinal: Positive for abdominal pain and constipation.  Genitourinary: Negative.   Musculoskeletal: Positive for falls.  Skin: Negative.   Neurological: Negative.   Endo/Heme/Allergies: Bruises/bleeds easily.  Psychiatric/Behavioral: Negative.  Negative for suicidal ideas.    Objective:   BP (!) 109/56   Pulse 61   Ht 5\' 4"  (1.626 m)   Wt 158 lb (71.7 kg)   BMI 27.12 kg/m  CONSTITUTIONAL: Well-developed, well-nourished female in no acute distress.  PSYCHIATRIC: Normal mood and  affect. Normal behavior. Normal judgment and thought content. [place]: Alert and oriented to person, place, and time. Normal muscle tone coordination. No cranial nerve deficit noted. HENT:  Normocephalic, atraumatic, External right and left ear normal. Oropharynx is clear and moist EYES: Conjunctivae and EOM are normal. No scleral icterus.  NECK: Normal range of motion, supple, no masses.  Normal thyroid.  SKIN: Skin is warm and dry. No rash noted. Not diaphoretic. No erythema. No pallor. CARDIOVASCULAR: Normal heart rate noted, regular rhythm, no murmur. RESPIRATORY: Clear to auscultation bilaterally. Effort and breath sounds normal, no problems with respiration noted. BREASTS: Symmetric in size. No masses, skin changes, nipple drainage, or lymphadenopathy. ABDOMEN: Soft, normal bowel sounds, no distention noted.  Mild R sided tenderness, no rebound or guarding. Midline incision well-healed; appendectomy incision well-healed  BLADDER: Normal PELVIC:  External Genitalia: Normal; Moderate atrophic changes.  BUS: Normal  Vagina: Normal; Mild atrophy; vaginal cuff intact and nontender  Cervix:Surgically absent  Uterus:Surgically absent  Adnexa: Normal  RV: External Exam NormaI, No Rectal Masses and Normal Sphincter tone  MUSCULOSKELETAL: Normal range of motion. No tenderness.  No cyanosis, clubbing, or edema.  2+ distal pulses. LYMPHATIC: No Axillary, Supraclavicular, or Inguinal Adenopathy. SKIN: warm and dry, no rashes;  Left anterior thigh 1cm raised nodule with dark, increased black pigmentation and scaly appearance. Multiple seborrheic keratosis on back and thorax . Patient advised to start her abdomen increased black pigmentation  Assessment:   Annual gynecologic examination 81 y.o. Contraception: status post hysterectomy [id number] Menopause, asymptomatic Vaginal atrophy, mild, managed with estrogen cream R side belly pain- unlikely hernia, stool guiac cards to r/u occult  blood Raised, hyperpigmented scaly nodule on L anterior thigh- Ref to Dr. Sarina Ser  Plan:  Pap: Not needed Mammogram: thru pcp Stool Guaiac Testing:  Cards given Labs: thru pcp  Dermatology referral  Routine preventative health maintenance measures emphasized: Exercise/Diet/Weight control, Tobacco Warnings and Alcohol/Substance use risks  Return to [place], MD  Joyice Faster, [place]   I have seen, interviewed, and examined the patient in conjunction with the [place].A. student and affirm the diagnosis and management plan. Boen Sterbenz A. Sequita Wise, MD, FACOG   Note: This dictation was prepared with Dragon dictation along with smaller phrase technology. Any transcriptional errors that result from this process are unintentional.

## 2017-06-09 DIAGNOSIS — Z961 Presence of intraocular lens: Secondary | ICD-10-CM | POA: Diagnosis not present

## 2017-06-14 ENCOUNTER — Encounter: Payer: Self-pay | Admitting: Family Medicine

## 2017-06-14 ENCOUNTER — Ambulatory Visit (INDEPENDENT_AMBULATORY_CARE_PROVIDER_SITE_OTHER): Payer: Medicare Other | Admitting: Family Medicine

## 2017-06-14 DIAGNOSIS — J0100 Acute maxillary sinusitis, unspecified: Secondary | ICD-10-CM

## 2017-06-14 DIAGNOSIS — J329 Chronic sinusitis, unspecified: Secondary | ICD-10-CM | POA: Insufficient documentation

## 2017-06-14 MED ORDER — AMOXICILLIN-POT CLAVULANATE 875-125 MG PO TABS: 1.0000 | ORAL_TABLET | Freq: Two times a day (BID) | ORAL | 0 refills | Status: DC

## 2017-06-14 NOTE — Progress Notes (Signed)
  Tommi Rumps, MD Phone: [phone]  [name] [name] is a 81 y.o. female who presents today for same day visit.   Patient notes a week of left maxillary sinus pressure and left ear discomfort. Feels significant congestive. Was blowing stuff out of her nose that can get anything out now. Note she was treated about 2 months ago with Augmentin. Her symptoms did improve. Develop new symptoms about a week ago. No fevers or chills. Does note postnasal drip.  PMH: nonsmoker.   ROS see history of present illness  Objective  Physical Exam Vitals:   06/14/17 0947  BP: 130/60  Pulse: (!) 57  Temp: 98 F (36.7 C)    BP Readings from Last 3 Encounters:  06/14/17 130/60  06/08/17 (!) 109/56  05/26/17 130/60   Wt Readings from Last 3 Encounters:  06/14/17 160 lb 12.8 oz (72.9 kg)  06/08/17 158 lb (71.7 kg)  05/26/17 156 lb (70.8 kg)    Physical Exam  Constitutional: No distress.  HENT:  Head: Normocephalic and atraumatic.  Mouth/Throat: Oropharynx is clear and moist. No oropharyngeal exudate.  Normal TMs.  Eyes: Conjunctivae are normal. Pupils are equal, round, and reactive to light.  Cardiovascular: Normal rate, regular rhythm and normal heart sounds.   Pulmonary/Chest: Effort normal and breath sounds normal.  Neurological: She is alert. Gait normal.  Skin: Skin is warm and dry. She is not diaphoretic.     Assessment/Plan: Please see individual problem list.  Sinusitis Symptoms most consistent with sinusitis. We'll cover with Augmentin. Discussed taking a probiotic with this. She'll monitor and if not improving follow-up.   No orders of the defined types were placed in this encounter.   Meds ordered this encounter  Medications  . amoxicillin-clavulanate (AUGMENTIN) 875-125 MG tablet    Sig: Take 1 tablet by mouth 2 (two) times daily.    Dispense:  14 tablet    Refill:  0   Tommi Rumps, MD [place]

## 2017-06-14 NOTE — Assessment & Plan Note (Signed)
Symptoms most consistent with sinusitis. We'll cover with Augmentin. Discussed taking a probiotic with this. She'll monitor and if not improving follow-up.

## 2017-06-14 NOTE — Patient Instructions (Signed)
Nice to see you. You likely have a sinus infection. We will treat this with Augmentin. If you develop fevers or worsening symptoms please contact us.

## 2017-06-16 DIAGNOSIS — Z1211 Encounter for screening for malignant neoplasm of colon: Secondary | ICD-10-CM | POA: Diagnosis not present

## 2017-06-17 LAB — FECAL OCCULT BLOOD, IMMUNOCHEMICAL: Fecal Occult Bld: POSITIVE — AB

## 2017-06-17 LAB — PLEASE NOTE

## 2017-06-20 ENCOUNTER — Other Ambulatory Visit: Payer: Self-pay

## 2017-06-20 ENCOUNTER — Other Ambulatory Visit: Payer: Self-pay | Admitting: Cardiovascular Disease

## 2017-06-20 DIAGNOSIS — R195 Other fecal abnormalities: Secondary | ICD-10-CM

## 2017-06-21 ENCOUNTER — Telehealth: Payer: Self-pay | Admitting: Obstetrics and Gynecology

## 2017-06-21 NOTE — Telephone Encounter (Signed)
Patient lvm wanting to speak with a nurse or physician in regards to having suggestions to see a "gastrologist". Patient did not disclose any other information. Please advise.

## 2017-06-21 NOTE — Telephone Encounter (Signed)
lmtrc

## 2017-06-23 NOTE — Telephone Encounter (Signed)
Pt aware referral was placed on 06/20/2017. Pt aware if she has not gotten a call from [facility] by 7/16 to let me know.

## 2017-06-24 DIAGNOSIS — D485 Neoplasm of uncertain behavior of skin: Secondary | ICD-10-CM | POA: Diagnosis not present

## 2017-06-24 DIAGNOSIS — L821 Other seborrheic keratosis: Secondary | ICD-10-CM | POA: Diagnosis not present

## 2017-06-24 DIAGNOSIS — L570 Actinic keratosis: Secondary | ICD-10-CM | POA: Diagnosis not present

## 2017-06-24 DIAGNOSIS — D1801 Hemangioma of skin and subcutaneous tissue: Secondary | ICD-10-CM | POA: Diagnosis not present

## 2017-07-18 ENCOUNTER — Telehealth: Payer: Self-pay | Admitting: Cardiovascular Disease

## 2017-07-18 ENCOUNTER — Ambulatory Visit (INDEPENDENT_AMBULATORY_CARE_PROVIDER_SITE_OTHER): Payer: Medicare Other | Admitting: Physician Assistant

## 2017-07-18 ENCOUNTER — Encounter: Payer: Self-pay | Admitting: Physician Assistant

## 2017-07-18 ENCOUNTER — Ambulatory Visit (INDEPENDENT_AMBULATORY_CARE_PROVIDER_SITE_OTHER): Payer: Medicare Other | Admitting: *Deleted

## 2017-07-18 ENCOUNTER — Telehealth: Payer: Self-pay | Admitting: Internal Medicine

## 2017-07-18 VITALS — BP 158/80 | HR 59 | Resp 16

## 2017-07-18 VITALS — BP 130/82 | HR 93 | Ht 64.5 in | Wt 159.2 lb

## 2017-07-18 DIAGNOSIS — I43 Cardiomyopathy in diseases classified elsewhere: Secondary | ICD-10-CM

## 2017-07-18 DIAGNOSIS — I2510 Atherosclerotic heart disease of native coronary artery without angina pectoris: Secondary | ICD-10-CM

## 2017-07-18 DIAGNOSIS — R002 Palpitations: Secondary | ICD-10-CM | POA: Diagnosis not present

## 2017-07-18 DIAGNOSIS — R000 Tachycardia, unspecified: Secondary | ICD-10-CM | POA: Diagnosis not present

## 2017-07-18 DIAGNOSIS — I481 Persistent atrial fibrillation: Secondary | ICD-10-CM | POA: Diagnosis not present

## 2017-07-18 DIAGNOSIS — I872 Venous insufficiency (chronic) (peripheral): Secondary | ICD-10-CM

## 2017-07-18 DIAGNOSIS — I10 Essential (primary) hypertension: Secondary | ICD-10-CM | POA: Diagnosis not present

## 2017-07-18 DIAGNOSIS — I4819 Other persistent atrial fibrillation: Secondary | ICD-10-CM

## 2017-07-18 MED ORDER — AMIODARONE HCL 200 MG PO TABS: 200.0000 mg | ORAL_TABLET | Freq: Every day | ORAL | 6 refills | Status: DC

## 2017-07-18 NOTE — Progress Notes (Signed)
Patient came into office with c/o palpitations, to PCP while in visit with Husband PCP requested patient to be on nurse schedule for ECG. ECG performed and given to PCP patient BP 158/80 pulse 59-72 per pulse OX. Patient  stated symptoms started X 24 hours ago with fluttery feeling in chest with tightness. Denies chest pain at this time . PCP present ted with ECG and advised nurse to have patient call her cardiologist for appointment.

## 2017-07-18 NOTE — Telephone Encounter (Signed)
She saw her PCP today, was advised to call our office for appt.  See if she can come in to see Christell Faith, PA this afternoon.

## 2017-07-18 NOTE — Progress Notes (Signed)
Cardiology Office Note Date:  07/18/2017  Patient ID:  [name] [name], [name] [name] March 11, 1936, MRN [id number] PCP:  [name] Mc, [name] [name]  Cardiologist:  Dr. Fletcher Anon, [name] [name]    Chief Complaint: Afib follow up  History of Present Illness: [name] [name] is a 81 y.o. 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   Most recent echo from 10/2015 showed normal LV systolic function with mildly dilated left atrium, mild mitral regurgitation, and mild to moderate pulmonary hypertension. She called in May 2018 for bradycardia with heart rates in the 40s bpm. Her amiodarone was decreased back to 100 mg daily after it had been increased to 200 mg daily post DCCV. She was most recently seen by [name] [name] on 05/26/17 for follow up and was doing well at that time. Labs from PCP in June 2018 indicated a positive occult blood study, she has been referred to GI. Patient was seen at PCP office earlier today (there with her husband for his appointment) for palpitations with EKG showing Afib, 84 bpm, RBBB, inferolateral TWI.   She reported onset of palpitations with associated chest tightness on the morning of 7/29 upon waking up. She went to bed on 7/28 in her usual state of health. No recent illnesses, injuries, or trauma. She did use the riding mower to cut part of her yard on 7/27, though had no issues with that. She has continued to take Eliquis 5 mg bid and amiodarone 100 mg daily. She is not interested in undergoing another cardioversion at this time. BP at home since developing the above palpitations has been in the low 638V  systolic, leading her to hold amlodipine. Walking in from the parking lot this afternoon she did not note a large amount of SOB/fatigue, though was slightly more winded than she has been previously.   Labs 6/18: K+ 4.6, SCr 1.2 Labs 2/18: unremarkable CBC, normal TSH, LDL 85   Past Medical History:  Diagnosis Date  . Arthritis   . Atrial flutter ([place]) 02/2011   s/p cardioversion   . Cancer (HCC)    BREAST  . Chronic kidney disease    acute renal failure secondary to dehydration which is now resolved  . Cystocele   . GERD (gastroesophageal reflux disease)   . Headache(784.0)    chronic  . Hyperlipidemia   . Hypertension   . Knee fracture   . Light headedness    due to dehydration  . Mobitz type 2 second degree atrioventricular block    a. felt to be 2/2 amiodarone, resolved with decreased amiodarone dose  . Persistent atrial fibrillation ([place])    a. status post multiple DCCV, most recently in 07/2013  . PONV (postoperative nausea and vomiting)    oxycodone and codiene cause N/V   . Pre-syncope   . Tachycardia induced cardiomyopathy ([place])   . Venous insufficiency   . Vertigo     Past Surgical History:  Procedure Laterality Date  . ABDOMINAL HYSTERECTOMY  1990  . APPENDECTOMY    . AUGMENTATION MAMMAPLASTY Bilateral 1986   implants  . AUGMENTATION MAMMAPLASTY  1990  . AUGMENTATION MAMMAPLASTY  2011  . CARDIAC CATHETERIZATION    .  CARDIOVERSION     x 3  . CARDIOVERSION    . CARDIOVERSION N/A 02/07/2017   Procedure: CARDIOVERSION;  Surgeon: [name] Hampshire, [name] [name];  Location: [facility] OR[facility];  Service: Cardiovascular;  Laterality: N/A;  . CATARACT EXTRACTION W/PHACO Right 09/21/2016   Procedure: CATARACT EXTRACTION PHACO AND INTRAOCULAR LENS PLACEMENT ([place]);  Surgeon: [name] Robson, [name] [name];  Location: [facility];  Service: Ophthalmology;  Laterality: Right;  [place] 44.1 AP% 16.5 CDE 7.30 Fluid Pack Lot #[id number]  . CATARACT EXTRACTION W/PHACO Left 10/19/2016   Procedure: CATARACT  EXTRACTION PHACO AND INTRAOCULAR LENS PLACEMENT (IOC);  Surgeon: [name] Robson, [name] [name];  Location: [facility];  Service: Ophthalmology;  Laterality: Left;  [place] 53.7 AP% 19.5 CDE 10.45 Fluid pack lot # [id number]  . CHOLECYSTECTOMY    . COMBINED AUGMENTATION MAMMAPLASTY AND ABDOMINOPLASTY    . JOINT REPLACEMENT Left 06/04/2013   left knee  . KNEE ARTHROSCOPY Right 08/16/2016   Procedure: ARTHROSCOPY KNEE, tear posterior horn medial meniscus, tear anterior and posterior horns of lateral meniscus, chondromalacia of lateral compartment grade 3 patella and grade 4 medial;  Surgeon: [name] Leep, [name] [name];  Location: [facility];  Service: Orthopedics;  Laterality: Right;  . MASTECTOMY  1986   Bilateral with silicone  breast implants, s/p saline replacements  . Multiple orthopedic procedures    . NOSE SURGERY    . TOTAL KNEE ARTHROPLASTY Left     Current Meds  Medication Sig  . acetaminophen (TYLENOL) 500 MG tablet Take 1,000 mg by mouth every 6 (six) hours as needed (pain).   [name] aluminum-magnesium hydroxide 200-200 MG/5ML suspension Take 20 mLs by mouth every 6 (six) hours as needed for indigestion.   [name] amiodarone (PACERONE) 100 MG tablet Take 1 tablet (100 mg total) by mouth daily.  [name] amLODipine (NORVASC) 2.5 MG tablet TAKE 1-2 TABLETS BY MOUTH TWICE DAILY. TAKE 1 TABLET IN THE MORNING AND 2 TABLETS IN THE EVENING  . amoxicillin-clavulanate (AUGMENTIN) 875-125 MG tablet Take 1 tablet by mouth 2 (two) times daily.  [name] apixaban (ELIQUIS) 5 MG TABS tablet Take 1 tablet (5 mg total) by mouth 2 (two) times daily.  . Coenzyme Q10 100 MG capsule Take by mouth.  . ergocalciferol (DRISDOL) 50000 units capsule Take 1 capsule (50,000 Units total) by mouth once a week. (Patient taking differently: Take 1,000 Units by mouth daily. )  . fluticasone (FLONASE) 50 MCG/ACT nasal spray Place 2 sprays into both nostrils daily.  . furosemide (LASIX) 20 MG tablet Take 1 tablet (20 mg total) by mouth daily. As needed for edema not  more than every other day  . pantoprazole (PROTONIX) 40 MG tablet Take 1 tablet (40 mg total) by mouth daily.  . pravastatin (PRAVACHOL) 20 MG tablet TAKE ONE (1) TABLET BY MOUTH EVERY DAY  . Probiotic Product (PROBIOTIC ADVANCED PO) Take 1 tablet by mouth daily.  . sodium chloride (OCEAN) 0.65 % SOLN nasal spray Place 1 spray into both nostrils 2 (two) times daily as needed for congestion.   . traMADol (ULTRAM) 50 MG tablet Take 1 tablet (50 mg total) by mouth every 8 (eight) hours as needed.    Allergies:   Iodine; Biaxin [clarithromycin]; Codeine; Enalapril; Fluocinonide; Oxycodone; Promethazine; Warfarin and related; and Xarelto [rivaroxaban]   Social History:  The patient  reports that she has never smoked. She has never used smokeless tobacco. She reports that she does not drink alcohol or use drugs.   Family History:  The patient's family history includes Breast cancer in her  maternal grandmother, sister, sister, and sister; Cancer in her father, maternal grandmother, sister, sister, and sister; Diabetes in her brother; Heart disease in her mother and son.  ROS:   Review of Systems  Constitutional: Positive for malaise/fatigue. Negative for chills, diaphoresis, fever and weight loss.  HENT: Negative for congestion.   Eyes: Negative for discharge and redness.  Respiratory: Negative for cough, hemoptysis, sputum production, shortness of breath and wheezing.   Cardiovascular: Positive for palpitations. Negative for chest pain, orthopnea, claudication, leg swelling and PND.       Chest tightness  Gastrointestinal: Negative for abdominal pain, blood in stool, heartburn, melena, nausea and vomiting.  Genitourinary: Negative for hematuria.  Musculoskeletal: Negative for falls and myalgias.  Skin: Negative for rash.  Neurological: Positive for weakness. Negative for dizziness, tingling, tremors, sensory change, speech change, focal weakness and loss of consciousness.  Endo/Heme/Allergies:  Does not bruise/bleed easily.  Psychiatric/Behavioral: Negative for substance abuse. The patient is not nervous/anxious.   All other systems reviewed and are negative.    PHYSICAL EXAM:  VS:  BP 130/82 (BP Location: Left Arm, Patient Position: Sitting, Cuff Size: Normal)   Pulse 93   Ht 5' 4.5" (1.638 m)   Wt 159 lb 4 oz (72.2 kg)   BMI 26.91 kg/m  BMI: Body mass index is 26.91 kg/m.  Physical Exam  Constitutional: She is oriented to person, place, and time. She appears well-developed and well-nourished.  HENT:  Head: Normocephalic and atraumatic.  Eyes: Right eye exhibits no discharge. Left eye exhibits no discharge.  Neck: Normal range of motion. No JVD present.  Cardiovascular: Normal rate, S1 normal, S2 normal and normal heart sounds.  An irregularly irregular rhythm present. Exam reveals no distant heart sounds, no friction rub, no midsystolic click and no opening snap.   No murmur heard. Pulmonary/Chest: Effort normal and breath sounds normal. No respiratory distress. She has no decreased breath sounds. She has no wheezes. She has no rales. She exhibits no tenderness.  Abdominal: Soft. She exhibits no distension. There is no tenderness.  Musculoskeletal: She exhibits edema.  Trace to 1+ bilateral pre-tibial edema to the mid shins (left > right)  Neurological: She is alert and oriented to person, place, and time.  Skin: Skin is warm and dry. No cyanosis. Nails show no clubbing.  Psychiatric: She has a normal mood and affect. Her speech is normal and behavior is normal. Judgment and thought content normal.    EKG:  Was ordered and interpreted by me today. Shows Afib, 94 bpm, RBBB, inferolateral TWI (unchanged)  Recent Labs: 01/24/2017: TSH 2.72 02/03/2017: ALT 17; Hemoglobin 14.0; Platelets 236 05/25/2017: BUN 21; Creatinine 1.2; Potassium 4.6; Sodium 138  01/24/2017: Cholesterol 169; HDL 66.60; LDL Cholesterol 85; Total CHOL/HDL Ratio 3; Triglycerides 90.0; VLDL 18.0   CrCl  cannot be calculated (Patient's most recent lab result is older than the maximum 21 days allowed.).   Wt Readings from Last 3 Encounters:  07/18/17 159 lb 4 oz (72.2 kg)  06/14/17 160 lb 12.8 oz (72.9 kg)  06/08/17 158 lb (71.7 kg)     Other studies reviewed: Additional studies/records reviewed today include: summarized above  ASSESSMENT AND PLAN:  1. Persistent Afib: Currently in rate-controlled Afib. She has previously undergone multiple DCCV, most recently in 01/2017. She is almost certain she went into Afib on the morning of 7/29. Her case is slightly more complicated given her sinus rate is bradycardic in the 50s bpm and when she is in Afib her rate  has been in the 80s to 90s bpm. Because of her baseline bradycardic sinus rate I am unable to start her on a rate controlling medication. Increase amiodarone to 200 mg daily. Schedule to see EP for discussion of Afib ablation. She is not interested in undergoing any further DCCV. Continue Eliquis 5 mg bid. She has not missed any doses of anticoagulation. CHADS2VASc at least 4 (HTN, age x 2, female). Check bmet and magnesium. Recent tsh as above.   2. Nonobstructive CAD: No symptoms concerning for ischemia. Recent negative nuclear stress test in 12/2016. On Eliquis in place of ASA. No beta blocker given bradycardic sinus rate.   3. History of tachy-medicated cardiomyopathy: Rate controlled currently. She does not appear volume overloaded. Not on a beta blocker given underlying sinus bradycardic rate when in sinus rhythm.   4. HTN: BP is mildly elevated in the office today, though since she developed palpitations on the morning of 7/29 she has noted softer BP in the low 355H systolic. She has not taken amlodipine since the morning of 7/28. Continue to hold amlodipine for systolic blood pressure < 100 mmHg.   5. Venous insufficiency: Continue Lasix.   Disposition: F/u with [name] [name] one month after seeing EP.   Current medicines are reviewed at  length with the patient today.  The patient did not have any concerns regarding medicines.  Melvern Banker PA-C 07/18/2017 2:41 PM     [address] [place], [place] [address] [phone]

## 2017-07-18 NOTE — Telephone Encounter (Signed)
PT went to PCP office today and reports EKG A Fib Pt wants to be seen ASAP to talk about options Pt c/o of Chest Pain: STAT if CP now or developed within 24 hours  1. Are you having CP right now? Yes, tight  2. Are you experiencing any other symptoms (ex. SOB, nausea, vomiting, sweating)? SOB and weak  3. How long have you been experiencing CP? Started yesterday morning  4. Is your CP continuous or coming and going? Continuous  5. Have you taken Nitroglycerin? No, pt takes Amiodarone 100 mg once a day Pt wants to know if she should increase ?

## 2017-07-18 NOTE — Telephone Encounter (Signed)
Pt called back returning your call. Please advise, thank you! °

## 2017-07-18 NOTE — Telephone Encounter (Signed)
Notified patient was calling with labs see lab note.

## 2017-07-18 NOTE — Patient Instructions (Addendum)
Medication Instructions:  Please STOP amlodipine Please INCREASE your amiodarone to 200 mg once daily  Labwork: BMET, Magnesium  Testing/Procedures: None  Follow-Up: We are referring to Dr. Caryl Comes, electrophysiologist Follow-up with Dr. Fletcher Anon or Thurmond Butts 1 month after appt w/ Dr. Caryl Comes   If you need a refill on your cardiac medications before your next appointment, please call your pharmacy.

## 2017-07-19 LAB — BASIC METABOLIC PANEL
BUN/Creatinine Ratio: 19 (ref 12–28)
BUN: 22 mg/dL (ref 8–27)
CO2: 24 mmol/L (ref 20–29)
Calcium: 9.3 mg/dL (ref 8.7–10.3)
Chloride: 100 mmol/L (ref 96–106)
Creatinine, Ser: 1.15 mg/dL — ABNORMAL HIGH (ref 0.57–1.00)
GFR calc Af Amer: 52 mL/min/{1.73_m2} — ABNORMAL LOW (ref >59)
GFR calc non Af Amer: 45 mL/min/{1.73_m2} — ABNORMAL LOW (ref >59)
Glucose: 96 mg/dL (ref 65–99)
Potassium: 4.8 mmol/L (ref 3.5–5.2)
Sodium: 140 mmol/L (ref 134–144)

## 2017-07-19 LAB — MAGNESIUM: Magnesium: 2.5 mg/dL — ABNORMAL HIGH (ref 1.6–2.3)

## 2017-07-21 ENCOUNTER — Encounter: Payer: Self-pay | Admitting: Internal Medicine

## 2017-07-21 ENCOUNTER — Emergency Department: Payer: Medicare Other

## 2017-07-21 ENCOUNTER — Observation Stay
Admission: EM | Admit: 2017-07-21 | Discharge: 2017-07-22 | Disposition: A | Discharge status: Home / Self Care | Payer: Medicare Other | Attending: Internal Medicine | Admitting: Internal Medicine

## 2017-07-21 ENCOUNTER — Encounter: Payer: Self-pay | Admitting: Emergency Medicine

## 2017-07-21 ENCOUNTER — Ambulatory Visit (INDEPENDENT_AMBULATORY_CARE_PROVIDER_SITE_OTHER): Payer: Medicare Other | Admitting: Internal Medicine

## 2017-07-21 VITALS — BP 124/60 | HR 87 | Ht 65.0 in | Wt 159.0 lb

## 2017-07-21 DIAGNOSIS — R079 Chest pain, unspecified: Secondary | ICD-10-CM | POA: Diagnosis present

## 2017-07-21 DIAGNOSIS — I129 Hypertensive chronic kidney disease with stage 1 through stage 4 chronic kidney disease, or unspecified chronic kidney disease: Secondary | ICD-10-CM | POA: Insufficient documentation

## 2017-07-21 DIAGNOSIS — N189 Chronic kidney disease, unspecified: Secondary | ICD-10-CM | POA: Insufficient documentation

## 2017-07-21 DIAGNOSIS — Z79899 Other long term (current) drug therapy: Secondary | ICD-10-CM | POA: Diagnosis not present

## 2017-07-21 DIAGNOSIS — Z79891 Long term (current) use of opiate analgesic: Secondary | ICD-10-CM | POA: Diagnosis not present

## 2017-07-21 DIAGNOSIS — I429 Cardiomyopathy, unspecified: Secondary | ICD-10-CM | POA: Insufficient documentation

## 2017-07-21 DIAGNOSIS — Z7901 Long term (current) use of anticoagulants: Secondary | ICD-10-CM | POA: Diagnosis not present

## 2017-07-21 DIAGNOSIS — K219 Gastro-esophageal reflux disease without esophagitis: Secondary | ICD-10-CM | POA: Diagnosis not present

## 2017-07-21 DIAGNOSIS — I481 Persistent atrial fibrillation: Secondary | ICD-10-CM

## 2017-07-21 DIAGNOSIS — Z853 Personal history of malignant neoplasm of breast: Secondary | ICD-10-CM | POA: Diagnosis not present

## 2017-07-21 DIAGNOSIS — E785 Hyperlipidemia, unspecified: Secondary | ICD-10-CM | POA: Diagnosis not present

## 2017-07-21 DIAGNOSIS — Z7951 Long term (current) use of inhaled steroids: Secondary | ICD-10-CM | POA: Diagnosis not present

## 2017-07-21 DIAGNOSIS — I4819 Other persistent atrial fibrillation: Secondary | ICD-10-CM

## 2017-07-21 HISTORY — DX: Chest pain, unspecified: R07.9

## 2017-07-21 NOTE — Progress Notes (Signed)
ELECTROPHYSIOLOGY CONSULT NOTE  Patient ID: [name] [name], MRN: [id number], DOB/AGE: 07-10-1936 81 y.o. Admit date: (Not on file) Date of Consult: 07/21/2017  Primary Physician: [name], MD Primary Cardiologist: MA     [name] [name] is a 81 y.o. female who is being seen today for the evaluation of afib  at the request of MA.    HPI [name] [name] is a 81 y.o. female  Seen in consultation for atrial fibrillation and tachybradycardia syndrome.  She has a history of atrial fibrillation/flutter with multiple cardioversions most recently 2/18. She has a history of tachycardia-induced cardiomyopathy with interval resolution.   Her cardioversion prior to that was 9/14. She had been managed on amiodarone at 100 mg a day for much of that time.  A Holter monitor was read as AV block and bradycardia. Having reviewed it personally, monitor from 8/16 demonstrated perhaps sinus node exit block and bradycardia but no AV block   She has had recurrent atrial fibrillation now for about a week. It is associated with lightheadedness weakness and shortness of breath and worsening peripheral edema.   Patient denies symptoms of GI intolerance, sun sensitivity, neurological symptoms attributable to amiodarone.        DATE TEST    **9*/15    TEE   EF 55 %   11/16    Echo   EF 60% % Mild LAE                 Date          TSH      ALT Cr Hgb     2/18           2.72       17 1.00 14.0          Thromboembolic risk factors ( age  -2, HTN-1, Gender-1) for a CHADSVASc Score of 4    Past Medical History:  Diagnosis Date  . Arthritis   . Atrial flutter ([place]) 02/2011   s/p cardioversion   . Cancer (HCC)    BREAST  . Chronic kidney disease    acute renal failure secondary to dehydration which is now resolved  . Cystocele   . GERD (gastroesophageal reflux disease)   . Headache(784.0)    chronic  . Hyperlipidemia   . Hypertension   . Knee fracture   . Light headedness    due to  dehydration  . Mobitz type 2 second degree atrioventricular block    a. felt to be 2/2 amiodarone, resolved with decreased amiodarone dose  . Persistent atrial fibrillation ([place])    a. status post multiple DCCV, most recently in 07/2013  . PONV (postoperative nausea and vomiting)    oxycodone and codiene cause N/V   . Pre-syncope   . Tachycardia induced cardiomyopathy ([place])   . Venous insufficiency   . Vertigo       Surgical History:  Past Surgical History:  Procedure Laterality Date  . ABDOMINAL HYSTERECTOMY  1990  . APPENDECTOMY    . AUGMENTATION MAMMAPLASTY Bilateral 1986   implants  . AUGMENTATION MAMMAPLASTY  1990  . AUGMENTATION MAMMAPLASTY  2011  . CARDIAC CATHETERIZATION    . CARDIOVERSION     x 3  . CARDIOVERSION    . CARDIOVERSION N/A 02/07/2017   Procedure: CARDIOVERSION;  Surgeon: Wellington Hampshire, MD;  Location: [facility] ORS;  Service: Cardiovascular;  Laterality: N/A;  . CATARACT EXTRACTION W/PHACO Right 09/21/2016   Procedure:  CATARACT EXTRACTION PHACO AND INTRAOCULAR LENS PLACEMENT (IOC);  Surgeon: Birder Robson, MD;  Location: [facility] ORS;  Service: Ophthalmology;  Laterality: Right;  [place] 44.1 AP% 16.5 CDE 7.30 Fluid Pack Lot #[id number]  . CATARACT EXTRACTION W/PHACO Left 10/19/2016   Procedure: CATARACT EXTRACTION PHACO AND INTRAOCULAR LENS PLACEMENT (IOC);  Surgeon: Birder Robson, MD;  Location: [facility] ORS;  Service: Ophthalmology;  Laterality: Left;  [place] 53.7 AP% 19.5 CDE 10.45 Fluid pack lot # [id number]  . CHOLECYSTECTOMY    . COMBINED AUGMENTATION MAMMAPLASTY AND ABDOMINOPLASTY    . JOINT REPLACEMENT Left 06/04/2013   left knee  . KNEE ARTHROSCOPY Right 08/16/2016   Procedure: ARTHROSCOPY KNEE, tear posterior horn medial meniscus, tear anterior and posterior horns of lateral meniscus, chondromalacia of lateral compartment grade 3 patella and grade 4 medial;  Surgeon: Dereck Leep, MD;  Location: [facility] ORS;  Service: Orthopedics;  Laterality: Right;  .  MASTECTOMY  1986   Bilateral with silicone  breast implants, s/p saline replacements  . Multiple orthopedic procedures    . NOSE SURGERY    . TOTAL KNEE ARTHROPLASTY Left      Home Meds: Prior to Admission medications   Medication Sig Start Date End Date Taking? Authorizing Provider  acetaminophen (TYLENOL) 500 MG tablet Take 1,000 mg by mouth every 6 (six) hours as needed (pain).    Yes [provider]  aluminum-magnesium hydroxide 200-200 MG/5ML suspension Take 20 mLs by mouth every 6 (six) hours as needed for indigestion.    Yes [provider]  amiodarone (PACERONE) 200 MG tablet Take 1 tablet (200 mg total) by mouth daily. 07/18/17  Yes Dunn, Areta Haber, PA-C  apixaban (ELIQUIS) 5 MG TABS tablet Take 1 tablet (5 mg total) by mouth 2 (two) times daily. 01/30/16  Yes Wellington Hampshire, MD  cholecalciferol (VITAMIN D) 1000 units tablet Take 1,000 Units by mouth daily.   Yes [provider]  Coenzyme Q10 100 MG capsule Take by mouth.   Yes [provider]  fluticasone (FLONASE) 50 MCG/ACT nasal spray Place 2 sprays into both nostrils daily. 12/29/15  Yes Doss, Velora Heckler, NP  furosemide (LASIX) 20 MG tablet Take 1 tablet (20 mg total) by mouth daily. As needed for edema not more than every other day 08/31/16  Yes [name], MD  pantoprazole (PROTONIX) 40 MG tablet Take 1 tablet (40 mg total) by mouth daily. 12/31/16  Yes Wellington Hampshire, MD  pravastatin (PRAVACHOL) 20 MG tablet TAKE ONE (1) TABLET BY MOUTH EVERY DAY 04/12/17  Yes [name], MD  Probiotic Product (PROBIOTIC ADVANCED PO) Take 1 tablet by mouth daily.   Yes [provider]  sodium chloride (OCEAN) 0.65 % SOLN nasal spray Place 1 spray into both nostrils 2 (two) times daily as needed for congestion.    Yes [provider]  traMADol (ULTRAM) 50 MG tablet Take 1 tablet (50 mg total) by mouth every 8 (eight) hours as needed. 04/08/17  Yes [name], MD    Allergies:    Allergies  Allergen Reactions  . Iodine Anaphylaxis    NO PROBLEMS WITH BETADINE  . Biaxin [Clarithromycin] Nausea And Vomiting  . Codeine Nausea And Vomiting  . Enalapril Other (See Comments)    unknown  . Fluocinonide Other (See Comments)    Tingling sensation in head and redness to scalp.  . Oxycodone Nausea And Vomiting  . Promethazine Other (See Comments)    Unknown   . Warfarin And Related   .  Xarelto [Rivaroxaban] Other (See Comments)    Unknown     Social History   Social History  . Marital status: Married    Spouse name: N/A  . Number of children: 1  . Years of education: N/A   Occupational History  .  Retired   Social History Main Topics  . Smoking status: Never Smoker  . Smokeless tobacco: Never Used  . Alcohol use No  . Drug use: No  . Sexual activity: No   Other Topics Concern  . Not on file   Social History Narrative  . No narrative on file     Family History  Problem Relation Age of Onset  . Heart disease Mother   . Cancer Father        stomach  . Heart disease Son        found at autopsy  . Cancer Sister        breast  . Breast cancer Sister   . Cancer Maternal Grandmother        breast  . Breast cancer Maternal Grandmother   . Cancer Sister        breast  . Breast cancer Sister   . Breast cancer Sister   . Cancer Sister        breast  . Diabetes Brother   . Ovarian cancer Neg Hx      ROS:  Please see the history of present illness.     All other systems reviewed and negative.    Physical Exam: Blood pressure 124/60, pulse 87, height 5\' 5"  (1.651 m), weight 159 lb (72.1 kg). General: Well developed, well nourished female in no acute distress. Head: Normocephalic, atraumatic, sclera non-icteric, no xanthomas, nares are without discharge. EENT: normal  Lymph Nodes:  none Neck: Negative for carotid bruits. JVD not elevated. Back:without scoliosis kyphosis Lungs: Clear bilaterally to auscultation without wheezes, rales, or  rhonchi. Breathing is unlabored. Heart: Irregularly irregular rate and rhythm no  murmur . No rubs, or gallops appreciated. Abdomen: Soft, non-tender, non-distended with normoactive bowel sounds. No hepatomegaly. No rebound/guarding. No obvious abdominal masses. Msk:  Strength and tone appear normal for age. Extremities: No clubbing or cyanosis. 1-2+ edema.  Distal pedal pulses are 2+ and equal bilaterally. Skin: Warm and Dry Neuro: Alert and oriented X 3. CN III-XII intact Grossly normal sensory and motor function . Psych:  Responds to questions appropriately with a normal affect.      Labs: Cardiac Enzymes No results for input(s): CKTOTAL, CKMB, TROPONINI in the last 72 hours. CBC Lab Results  Component Value Date   WBC 5.3 02/03/2017   HGB 14.0 02/03/2017   HCT 42.4 02/03/2017   MCV 95.5 02/03/2017   PLT 236 02/03/2017   PROTIME: No results for input(s): LABPROT, INR in the last 72 hours. Chemistry  Recent Labs Lab 07/18/17 1520  NA 140  K 4.8  CL 100  CO2 24  BUN 22  CREATININE 1.15*  CALCIUM 9.3  GLUCOSE 96   Lipids Lab Results  Component Value Date   CHOL 169 01/24/2017   HDL 66.60 01/24/2017   LDLCALC 85 01/24/2017   TRIG 90.0 01/24/2017   BNP Pro B Natriuretic peptide (BNP)  Date/Time Value Ref Range Status  03/05/2009 02:49 PM 398.0 (H) 0.0 - 100.0 pg/mL Final   Thyroid Function Tests: No results for input(s): TSH, T4TOTAL, T3FREE, THYROIDAB in the last 72 hours.  Invalid input(s): FREET3 Miscellaneous No results found for: DDIMER  Radiology/Studies:  No  results found.  EKG: Atrial fibrillation and 87 Intervals-13/40 Axis XLIX Right bundle branch block   Assessment and Plan:  Atrial fibrillation-persistent  Amiodarone therapy  Sinus bradycardia/sinus node dysfunction  Rate related cardiomyopathy-resolved  Hypertension   On Anticoagulation;  No bleeding issues   we'll check amiodarone surveillance laboratories  Given her  history of sinus bradycardia, I suggested that after month she reduce her amiodarone from 200--100 mg a day.  Her frequency of atrial fibrillation episodes is very low. While there has been only a six-month interval at this point, this is only the second episode in almost 40 years. Hence, I think ongoing amiodarone therapy is a very good option. If the frequency changes, higher doses of amiodarone may well be successful. This might well precipitate the need for pacing to which she is somewhat averse as the dose reduction as noted above.  We will arrange for cardioversion with Dr. Gaylyn Cheers at his earliest convenience  Blood pressure is well-controlled  We will be glad to see her again as needed    Virl Axe

## 2017-07-21 NOTE — Patient Instructions (Addendum)
Medication Instructions: - Your physician recommends that you continue on your current medications as directed. Please refer to the Current Medication list given to you today.  Labwork: - Your physician recommends that you have lab work today: CBC/ Liver/ TSH  Procedures/Testing: - Your physician has recommended that you have a Cardioversion (DCCV). Electrical Cardioversion uses a jolt of electricity to your heart either through paddles or wired patches attached to your chest. This is a controlled, usually prescheduled, procedure. Defibrillation is done under light anesthesia in the hospital, and you usually go home the day of the procedure. This is done to get your heart back into a normal rhythm. You are not awake for the procedure.   You are scheduled for a Cardioversion on __Friday 8/10/18_ with Dr._Arida_  Please arrive at the [facility] [facility] at __6:30_ a.m. on the day of your procedure.  DIET INSTRUCTIONS:  Nothing to eat or drink after midnight the night before your procedure       1) Labs: ____8/2/18____        2) Medications:  You may take all of your medications with enough water to get                them down safely the morning of your procedure unless listed below:         - hold lasix (furosemide) the morning of your procedure  3) Must have a responsible person to drive you home.   4) Bring a current list of your medications and current insurance cards.    If you have any questions after you get home, please call the office at [phone]   Follow-Up: - Your physician recommends that you schedule a follow-up appointment in: 4-6 weeks (from 810/18) with Dr. Hillery Hunter, PA   Any Additional Special Instructions Will Be Listed Below (If Applicable).     If you need a refill on your cardiac medications before your next appointment, please call your pharmacy.

## 2017-07-21 NOTE — ED Provider Notes (Signed)
[facility] Emergency Department Provider Note   First MD Initiated Contact with Patient 07/21/17 2343     (approximate)  I have reviewed the triage vital signs and the nursing notes.   HISTORY  Chief Complaint Chest Pain    HPI [name] [name] is a 81 y.o. female Lucita Ferrara of chronic medical conditions including atrial fibrillation presents to the emergency department with central chest tightness radiating to the left shoulder accompanied by dyspnea with onset tonight. Patient states that she was seen by Dr. Caryl Comes today noted to be in atrial fibrillation with plan for cardioversion 1 week from today. Patient states however upon going home noted this chest tightness which has been persistent for "couple hours". Patient denies any diaphoresis no nausea or vomiting. Patient denies any abdominal pain. Patient also admits to bilateral lower extremity swelling.   Past Medical History:  Diagnosis Date  . Arthritis   . Atrial flutter ([place]) 02/2011   s/p cardioversion   . Cancer (HCC)    BREAST  . Chronic kidney disease    acute renal failure secondary to dehydration which is now resolved  . Cystocele   . GERD (gastroesophageal reflux disease)   . Headache(784.0)    chronic  . Hyperlipidemia   . Hypertension   . Knee fracture   . Light headedness    due to dehydration  . Mobitz type 2 second degree atrioventricular block    a. felt to be 2/2 amiodarone, resolved with decreased amiodarone dose  . Persistent atrial fibrillation ([place])    a. status post multiple DCCV, most recently in 07/2013  . PONV (postoperative nausea and vomiting)    oxycodone and codiene cause N/V   . Pre-syncope   . Tachycardia induced cardiomyopathy ([place])   . Venous insufficiency   . Vertigo     Patient Active Problem List   Diagnosis Date Noted  . Sinusitis 06/14/2017  . Influenza 01/10/2017  . Rash and nonspecific skin eruption 11/25/2016  . Concussion with no loss of  consciousness 09/01/2016  . Chronic venous insufficiency 09/01/2016  . Preoperative clearance 06/29/2016  . Other fatigue 06/29/2016  . Visit for preventive health examination 12/15/2015  . GERD (gastroesophageal reflux disease) 11/20/2015  . Cystocele 11/18/2015  . Unstable bladder 11/18/2015  . Vaginal atrophy 11/18/2015  . Chronic suprapubic pain 10/14/2015  . Inguinal hernia 10/13/2015  . Impaired fasting glucose 04/24/2015  . Pulmonary hypertension, moderate to severe ([place]) 04/24/2015  . Arthritis of knee, degenerative 02/21/2015  . Vitamin D deficiency 12/27/2014  . S/P TAH-BSO 12/13/2014  . S/P bilateral mastectomy 12/13/2014  . Long term current use of anticoagulant therapy 09/25/2014  . HH (hiatus hernia) 04/20/2014  . Tachycardia induced cardiomyopathy ([place]) 07/20/2013  . Unspecified vitamin D deficiency 04/24/2013  . Family history of breast cancer in female 04/23/2013  . Medicare annual wellness visit, subsequent 04/23/2013  . Obesity 06/30/2012  . History of [place] spotted fever 06/22/2012  . Persistent atrial fibrillation ([place]) 05/09/2012  . Anxiety and depression 05/09/2012  . TACHYCARDIA 02/01/2011  . Hyperlipidemia 07/08/2010  . Essential hypertension 03/05/2009    Past Surgical History:  Procedure Laterality Date  . ABDOMINAL HYSTERECTOMY  1990  . APPENDECTOMY    . AUGMENTATION MAMMAPLASTY Bilateral 1986   implants  . AUGMENTATION MAMMAPLASTY  1990  . AUGMENTATION MAMMAPLASTY  2011  . CARDIAC CATHETERIZATION    . CARDIOVERSION     x 3  . CARDIOVERSION    . CARDIOVERSION N/A 02/07/2017  Procedure: CARDIOVERSION;  Surgeon: Wellington Hampshire, MD;  Location: [facility] ORS;  Service: Cardiovascular;  Laterality: N/A;  . CATARACT EXTRACTION W/PHACO Right 09/21/2016   Procedure: CATARACT EXTRACTION PHACO AND INTRAOCULAR LENS PLACEMENT ([place]);  Surgeon: Birder Robson, MD;  Location: [facility] ORS;  Service: Ophthalmology;  Laterality: Right;  [place] 44.1 AP%  16.5 CDE 7.30 Fluid Pack Lot #[id number]  . CATARACT EXTRACTION W/PHACO Left 10/19/2016   Procedure: CATARACT EXTRACTION PHACO AND INTRAOCULAR LENS PLACEMENT (IOC);  Surgeon: Birder Robson, MD;  Location: [facility] ORS;  Service: Ophthalmology;  Laterality: Left;  [place] 53.7 AP% 19.5 CDE 10.45 Fluid pack lot # [id number]  . CHOLECYSTECTOMY    . COMBINED AUGMENTATION MAMMAPLASTY AND ABDOMINOPLASTY    . JOINT REPLACEMENT Left 06/04/2013   left knee  . KNEE ARTHROSCOPY Right 08/16/2016   Procedure: ARTHROSCOPY KNEE, tear posterior horn medial meniscus, tear anterior and posterior horns of lateral meniscus, chondromalacia of lateral compartment grade 3 patella and grade 4 medial;  Surgeon: Dereck Leep, MD;  Location: [facility] ORS;  Service: Orthopedics;  Laterality: Right;  . MASTECTOMY  1986   Bilateral with silicone  breast implants, s/p saline replacements  . Multiple orthopedic procedures    . NOSE SURGERY    . TOTAL KNEE ARTHROPLASTY Left     Prior to Admission medications   Medication Sig Start Date End Date Taking? Authorizing Provider  acetaminophen (TYLENOL) 500 MG tablet Take 1,000 mg by mouth every 6 (six) hours as needed (pain).     [provider]  aluminum-magnesium hydroxide 200-200 MG/5ML suspension Take 20 mLs by mouth every 6 (six) hours as needed for indigestion.     [provider]  amiodarone (PACERONE) 200 MG tablet Take 1 tablet (200 mg total) by mouth daily. 07/18/17   Rise Mu, PA-C  apixaban (ELIQUIS) 5 MG TABS tablet Take 1 tablet (5 mg total) by mouth 2 (two) times daily. 01/30/16   Wellington Hampshire, MD  cholecalciferol (VITAMIN D) 1000 units tablet Take 1,000 Units by mouth daily.    [provider]  Coenzyme Q10 100 MG capsule Take by mouth.    [provider]  fluticasone (FLONASE) 50 MCG/ACT nasal spray Place 2 sprays into both nostrils daily. 12/29/15   Rubbie Battiest, NP  furosemide (LASIX) 20 MG tablet Take 1 tablet (20 mg total)  by mouth daily. As needed for edema not more than every other day 08/31/16   [name], MD  pantoprazole (PROTONIX) 40 MG tablet Take 1 tablet (40 mg total) by mouth daily. 12/31/16   Wellington Hampshire, MD  pravastatin (PRAVACHOL) 20 MG tablet TAKE ONE (1) TABLET BY MOUTH EVERY DAY 04/12/17   [name], MD  Probiotic Product (PROBIOTIC ADVANCED PO) Take 1 tablet by mouth daily.    [provider]  sodium chloride (OCEAN) 0.65 % SOLN nasal spray Place 1 spray into both nostrils 2 (two) times daily as needed for congestion.     [provider]  traMADol (ULTRAM) 50 MG tablet Take 1 tablet (50 mg total) by mouth every 8 (eight) hours as needed. 04/08/17   [name], MD    Allergies Iodine; Biaxin [clarithromycin]; Codeine; Enalapril; Fluocinonide; Oxycodone; Promethazine; Warfarin and related; and Xarelto [rivaroxaban]  Family History  Problem Relation Age of Onset  . Heart disease Mother   . Cancer Father        stomach  . Heart disease Son        found  at autopsy  . Cancer Sister        breast  . Breast cancer Sister   . Cancer Maternal Grandmother        breast  . Breast cancer Maternal Grandmother   . Cancer Sister        breast  . Breast cancer Sister   . Breast cancer Sister   . Cancer Sister        breast  . Diabetes Brother   . Ovarian cancer Neg Hx     Social History Social History  Substance Use Topics  . Smoking status: Never Smoker  . Smokeless tobacco: Never Used  . Alcohol use No    Review of Systems Constitutional: No fever/chills Eyes: No visual changes. ENT: No sore throat. Cardiovascular: Denies chest pain. Respiratory: Denies shortness of breath. Gastrointestinal: No abdominal pain.  No nausea, no vomiting.  No diarrhea.  No constipation. Genitourinary: Negative for dysuria. Musculoskeletal: Negative for neck pain.  Negative for back pain. Integumentary: Negative for rash. Neurological: Negative for headaches, focal  weakness or numbness.   ____________________________________________   PHYSICAL EXAM:  VITAL SIGNS: ED Triage Vitals [07/21/17 2337]  Enc Vitals Group     BP      Pulse      Resp      Temp      Temp src      SpO2      Weight 72.1 kg (159 lb)     Height      Head Circumference      Peak Flow      Pain Score      Pain Loc      Pain Edu?      Excl. in [place]?     Constitutional: Alert and oriented. Well appearing and in no acute distress. Eyes: Conjunctivae are normal.  Head: Atraumatic. Mouth/Throat: Mucous membranes are moist.  Oropharynx non-erythematous. Neck: No stridor.   Cardiovascular: Normal rate, irregular rhythm. Good peripheral circulation. Grossly normal heart sounds. Respiratory: Normal respiratory effort.  No retractions. Lungs CTAB. Gastrointestinal: Soft and nontender. No distention.  Musculoskeletal: 1+ bilateral lower extremity pitting edema. No gross deformities of extremities. Neurologic:  Normal speech and language. No gross focal neurologic deficits are appreciated.  Skin:  Skin is warm, dry and intact. No rash noted. Psychiatric: Mood and affect are normal. Speech and behavior are normal.  ____________________________________________   LABS (all labs ordered are listed, but only abnormal results are displayed)  Labs Reviewed  CBC  TROPONIN I  BASIC METABOLIC PANEL   ____________________________________________  EKG  ED ECG REPORT I, [place] N Jakari Jacot, the attending physician, personally viewed and interpreted this ECG.   Date: 07/22/2017  EKG Time: 11:36 PM  Rate: 89  Rhythm: Atrial fibrillation with right bundle branch block  Axis: Normal  Intervals: Normal  ST&T Change: None  ____________________________________________  RADIOLOGY I, [place] N Satonya Lux, personally viewed and evaluated these images (plain radiographs) as part of my medical decision making, as well as reviewing the written report by the radiologist.  Dg Chest Port 1  View  Result Date: 07/22/2017 CLINICAL DATA:  Initial evaluation for acute left-sided chest pain. EXAM: PORTABLE CHEST 1 VIEW COMPARISON:  Prior radiograph from 02/03/2017. FINDINGS: Moderate cardiomegaly, stable. Mediastinal silhouette normal. Aortic atherosclerosis. Lungs normally inflated. No overt pulmonary edema. No pleural effusion. No focal infiltrates. No pneumothorax. No acute osseus abnormality. IMPRESSION: 1. Stable cardiomegaly without pulmonary edema. 2. No other active cardiopulmonary disease. 3. Aortic atherosclerosis. Electronically Signed  By: Jeannine Boga M.D.   On: 07/22/2017 00:12      Procedures   ____________________________________________   INITIAL IMPRESSION / ASSESSMENT AND PLAN / ED COURSE  Pertinent labs & imaging results that were available during my care of the patient were reviewed by me and considered in my medical decision making (see chart for details).  81 year old female presenting to the emergency department with central chest discomfort with radiation to left shoulder and bilateral lower extremity edema. EKG revealed rate control atrial fibrillation without ST segment changes. Patient also noted to have a right bundle branch block. Laboratory data unremarkable including troponin less than 0.03. Chest x-ray likewise negative. Patient with ongoing chest pain and a such patient discussed with Dr. Estanislado Pandy for hospital admission for further evaluation and management.      ____________________________________________  FINAL CLINICAL IMPRESSION(S) / ED DIAGNOSES  Final diagnoses:  Chest pain, unspecified type     MEDICATIONS GIVEN DURING THIS VISIT:  Medications - No data to display   NEW OUTPATIENT MEDICATIONS STARTED DURING THIS VISIT:  New Prescriptions   No medications on file    Modified Medications   No medications on file    Discontinued Medications   No medications on file     Note:  This document was prepared using  Dragon voice recognition software and may include unintentional dictation errors.    Gregor Hams, MD 07/22/17 [phone]

## 2017-07-21 NOTE — ED Triage Notes (Signed)
Pt arrived to triage in wheelchair, holding left side of chest. Pt c/o left sided chest pain. Pt sts she has HX of afib and seen Dr. Jens Som today and was in Afib, per pt, Dr. Jens Som set up an appointment to "shock" her into regular rhythm on Monday. Pt to ED due to increase in chest pain and SOB.

## 2017-07-22 ENCOUNTER — Other Ambulatory Visit: Payer: Medicare Other

## 2017-07-22 ENCOUNTER — Telehealth: Payer: Self-pay | Admitting: Internal Medicine

## 2017-07-22 ENCOUNTER — Ambulatory Visit: Payer: Medicare Other | Admitting: Internal Medicine

## 2017-07-22 ENCOUNTER — Observation Stay: Payer: Medicare Other | Admitting: Anesthesiology

## 2017-07-22 ENCOUNTER — Other Ambulatory Visit: Payer: Self-pay

## 2017-07-22 ENCOUNTER — Encounter
Admission: EM | Disposition: A | Discharge status: Home / Self Care | Payer: Self-pay | Source: Home / Self Care | Attending: Emergency Medicine

## 2017-07-22 ENCOUNTER — Telehealth: Payer: Self-pay

## 2017-07-22 ENCOUNTER — Encounter: Payer: Self-pay | Admitting: Internal Medicine

## 2017-07-22 DIAGNOSIS — I10 Essential (primary) hypertension: Secondary | ICD-10-CM

## 2017-07-22 DIAGNOSIS — I481 Persistent atrial fibrillation: Secondary | ICD-10-CM | POA: Diagnosis not present

## 2017-07-22 DIAGNOSIS — K219 Gastro-esophageal reflux disease without esophagitis: Secondary | ICD-10-CM | POA: Diagnosis not present

## 2017-07-22 DIAGNOSIS — E785 Hyperlipidemia, unspecified: Secondary | ICD-10-CM | POA: Diagnosis not present

## 2017-07-22 DIAGNOSIS — R079 Chest pain, unspecified: Secondary | ICD-10-CM

## 2017-07-22 DIAGNOSIS — I4891 Unspecified atrial fibrillation: Secondary | ICD-10-CM | POA: Diagnosis not present

## 2017-07-22 HISTORY — PX: CARDIOVERSION: EP1203

## 2017-07-22 LAB — CBC
HCT: 37.6 % (ref 35.0–47.0)
HCT: 39.3 % (ref 35.0–47.0)
Hemoglobin: 12.6 g/dL (ref 12.0–16.0)
Hemoglobin: 13.2 g/dL (ref 12.0–16.0)
MCH: 31.7 pg (ref 26.0–34.0)
MCH: 32.1 pg (ref 26.0–34.0)
MCHC: 33.6 g/dL (ref 32.0–36.0)
MCHC: 33.7 g/dL (ref 32.0–36.0)
MCV: 93.9 fL (ref 80.0–100.0)
MCV: 95.4 fL (ref 80.0–100.0)
Platelets: 215 10*3/uL (ref 150–440)
Platelets: 238 10*3/uL (ref 150–440)
RBC: 3.94 MIL/uL (ref 3.80–5.20)
RBC: 4.18 MIL/uL (ref 3.80–5.20)
RDW: 14.1 % (ref 11.5–14.5)
RDW: 14.4 % (ref 11.5–14.5)
WBC: 4.8 10*3/uL (ref 3.6–11.0)
WBC: 5.3 10*3/uL (ref 3.6–11.0)

## 2017-07-22 LAB — HEPATIC FUNCTION PANEL
ALT: 16 IU/L (ref 0–32)
AST: 22 IU/L (ref 0–40)
Albumin: 4.7 g/dL (ref 3.5–4.8)
Alkaline Phosphatase: 69 IU/L (ref 39–117)
Bilirubin Total: 0.7 mg/dL (ref 0.0–1.2)
Bilirubin, Direct: 0.2 mg/dL (ref 0.00–0.40)
Total Protein: 7.4 g/dL (ref 6.0–8.5)

## 2017-07-22 LAB — BASIC METABOLIC PANEL
Anion gap: 7 (ref 5–15)
Anion gap: 8 (ref 5–15)
BUN: 16 mg/dL (ref 6–20)
BUN: 22 mg/dL — ABNORMAL HIGH (ref 6–20)
CO2: 24 mmol/L (ref 22–32)
CO2: 26 mmol/L (ref 22–32)
Calcium: 8.6 mg/dL — ABNORMAL LOW (ref 8.9–10.3)
Calcium: 9.4 mg/dL (ref 8.9–10.3)
Chloride: 107 mmol/L (ref 101–111)
Chloride: 107 mmol/L (ref 101–111)
Creatinine, Ser: 0.93 mg/dL (ref 0.44–1.00)
Creatinine, Ser: 0.95 mg/dL (ref 0.44–1.00)
GFR calc Af Amer: >60 mL/min (ref >60)
GFR calc Af Amer: >60 mL/min (ref >60)
GFR calc non Af Amer: 55 mL/min — ABNORMAL LOW (ref >60)
GFR calc non Af Amer: 57 mL/min — ABNORMAL LOW (ref >60)
Glucose, Bld: 182 mg/dL — ABNORMAL HIGH (ref 65–99)
Glucose, Bld: 96 mg/dL (ref 65–99)
Potassium: 3.8 mmol/L (ref 3.5–5.1)
Potassium: 4.1 mmol/L (ref 3.5–5.1)
Sodium: 138 mmol/L (ref 135–145)
Sodium: 141 mmol/L (ref 135–145)

## 2017-07-22 LAB — LIPID PANEL
Cholesterol: 147 mg/dL (ref 0–200)
HDL: 63 mg/dL (ref >40)
LDL Cholesterol: 74 mg/dL (ref 0–99)
Total CHOL/HDL Ratio: 2.3 RATIO
Triglycerides: 51 mg/dL (ref <150)
VLDL: 10 mg/dL (ref 0–40)

## 2017-07-22 LAB — CBC WITH DIFFERENTIAL/PLATELET
Basophils Absolute: 0 10*3/uL (ref 0.0–0.2)
Basos: 1 %
EOS (ABSOLUTE): 0 10*3/uL (ref 0.0–0.4)
Eos: 1 %
Hematocrit: 42.5 % (ref 34.0–46.6)
Hemoglobin: 13.9 g/dL (ref 11.1–15.9)
Immature Grans (Abs): 0 10*3/uL (ref 0.0–0.1)
Immature Granulocytes: 0 %
Lymphocytes Absolute: 1 10*3/uL (ref 0.7–3.1)
Lymphs: 15 %
MCH: 31.4 pg (ref 26.6–33.0)
MCHC: 32.7 g/dL (ref 31.5–35.7)
MCV: 96 fL (ref 79–97)
Monocytes Absolute: 0.3 10*3/uL (ref 0.1–0.9)
Monocytes: 4 %
Neutrophils Absolute: 5.5 10*3/uL (ref 1.4–7.0)
Neutrophils: 79 %
Platelets: 272 10*3/uL (ref 150–379)
RBC: 4.42 x10E6/uL (ref 3.77–5.28)
RDW: 14.5 % (ref 12.3–15.4)
WBC: 6.9 10*3/uL (ref 3.4–10.8)

## 2017-07-22 LAB — TROPONIN I
Troponin I: <0.03 ng/mL (ref <0.03)
Troponin I: <0.03 ng/mL (ref <0.03)
Troponin I: <0.03 ng/mL (ref <0.03)

## 2017-07-22 LAB — TSH: TSH: 3.5 u[IU]/mL (ref 0.450–4.500)

## 2017-07-22 SURGERY — CARDIOVERSION (CATH LAB)
Anesthesia: General

## 2017-07-22 MED ORDER — SODIUM CHLORIDE 0.9 % IV SOLN
250.0000 mL | INTRAVENOUS | Status: DC
  Administered 2017-07-22: 250 mL via INTRAVENOUS

## 2017-07-22 MED ORDER — SODIUM CHLORIDE 0.9 % WEIGHT BASED INFUSION: 1.0000 mL/kg/h | INTRAVENOUS | Status: DC

## 2017-07-22 MED ORDER — PANTOPRAZOLE SODIUM 40 MG PO TBEC
40.0000 mg | DELAYED_RELEASE_TABLET | Freq: Every day | ORAL | Status: DC
  Administered 2017-07-22: 40 mg via ORAL
  Filled 2017-07-22: qty 1

## 2017-07-22 MED ORDER — SODIUM CHLORIDE 0.9% FLUSH: 3.0000 mL | INTRAVENOUS | Status: DC | PRN

## 2017-07-22 MED ORDER — ENOXAPARIN SODIUM 40 MG/0.4ML ~~LOC~~ SOLN: 40.0000 mg | SUBCUTANEOUS | Status: DC

## 2017-07-22 MED ORDER — FLUTICASONE PROPIONATE 50 MCG/ACT NA SUSP
2.0000 | Freq: Every day | NASAL | Status: DC
  Administered 2017-07-22: 2 via NASAL
  Filled 2017-07-22: qty 16

## 2017-07-22 MED ORDER — SODIUM CHLORIDE 0.9% FLUSH
3.0000 mL | Freq: Two times a day (BID) | INTRAVENOUS | Status: DC
  Administered 2017-07-22: 3 mL via INTRAVENOUS

## 2017-07-22 MED ORDER — SODIUM CHLORIDE 0.9 % IV SOLN
250.0000 mL | INTRAVENOUS | Status: DC | PRN
Start: 2017-07-22 — End: 2017-07-22
  Administered 2017-07-22: 10:00:00 via INTRAVENOUS

## 2017-07-22 MED ORDER — PROPOFOL 10 MG/ML IV BOLUS
INTRAVENOUS | Status: AC
  Filled 2017-07-22: qty 20

## 2017-07-22 MED ORDER — SODIUM CHLORIDE 0.9 % WEIGHT BASED INFUSION: 3.0000 mL/kg/h | INTRAVENOUS | Status: DC

## 2017-07-22 MED ORDER — PROPOFOL 10 MG/ML IV BOLUS
INTRAVENOUS | Status: DC | PRN
  Administered 2017-07-22: 20 mg via INTRAVENOUS
  Administered 2017-07-22: 50 mg via INTRAVENOUS

## 2017-07-22 MED ORDER — ACETAMINOPHEN 325 MG PO TABS
650.0000 mg | ORAL_TABLET | ORAL | Status: DC | PRN
  Administered 2017-07-22: 650 mg via ORAL
  Filled 2017-07-22: qty 2

## 2017-07-22 MED ORDER — ASPIRIN 300 MG RE SUPP: 300.0000 mg | RECTAL | Status: DC

## 2017-07-22 MED ORDER — ASPIRIN 81 MG PO CHEW
324.0000 mg | CHEWABLE_TABLET | Freq: Once | ORAL | Status: AC
  Administered 2017-07-22: 324 mg via ORAL
  Filled 2017-07-22: qty 4

## 2017-07-22 MED ORDER — APIXABAN 5 MG PO TABS
5.0000 mg | ORAL_TABLET | Freq: Two times a day (BID) | ORAL | Status: DC
  Administered 2017-07-22: 5 mg via ORAL
  Filled 2017-07-22: qty 1

## 2017-07-22 MED ORDER — ONDANSETRON HCL 4 MG/2ML IJ SOLN: 4.0000 mg | Freq: Four times a day (QID) | INTRAMUSCULAR | Status: DC | PRN

## 2017-07-22 MED ORDER — VITAMIN D 1000 UNITS PO TABS
1000.0000 [IU] | ORAL_TABLET | Freq: Every day | ORAL | Status: DC
  Administered 2017-07-22: 1000 [IU] via ORAL
  Filled 2017-07-22: qty 1

## 2017-07-22 MED ORDER — GI COCKTAIL ~~LOC~~
30.0000 mL | Freq: Once | ORAL | Status: AC
  Administered 2017-07-22: 30 mL via ORAL
  Filled 2017-07-22: qty 30

## 2017-07-22 MED ORDER — PRAVASTATIN SODIUM 20 MG PO TABS: 20.0000 mg | ORAL_TABLET | Freq: Every day | ORAL | Status: DC

## 2017-07-22 MED ORDER — ASPIRIN 81 MG PO CHEW: 324.0000 mg | CHEWABLE_TABLET | ORAL | Status: DC

## 2017-07-22 MED ORDER — NITROGLYCERIN 0.4 MG SL SUBL: 0.4000 mg | SUBLINGUAL_TABLET | SUBLINGUAL | Status: DC | PRN

## 2017-07-22 MED ORDER — AMIODARONE HCL 200 MG PO TABS
200.0000 mg | ORAL_TABLET | Freq: Every day | ORAL | Status: DC
  Administered 2017-07-22: 200 mg via ORAL
  Filled 2017-07-22: qty 1

## 2017-07-22 MED ORDER — RISAQUAD PO CAPS
ORAL_CAPSULE | Freq: Every day | ORAL | Status: DC
  Administered 2017-07-22: 1 via ORAL
  Filled 2017-07-22: qty 1

## 2017-07-22 MED ORDER — ASPIRIN EC 81 MG PO TBEC: 81.0000 mg | DELAYED_RELEASE_TABLET | Freq: Every day | ORAL | Status: DC

## 2017-07-22 NOTE — Telephone Encounter (Signed)
[name] [name] from [facility] called and needs to schedule pt for a 2 week HFU. Pt was in for chest pain. Pleas advise, thank you!

## 2017-07-22 NOTE — Discharge Summary (Signed)
[place] [facility] [place] NAME: [name] [name]    MR#:  [id number]  DATE OF BIRTH:  09/27/1936  DATE OF ADMISSION:  07/21/2017   ADMITTING PHYSICIAN: Saundra Shelling, MD  DATE OF DISCHARGE: 07/22/2017 PRIMARY CARE PHYSICIAN: [name], MD   ADMISSION DIAGNOSIS:  Chest pain, unspecified type [R07.9] DISCHARGE DIAGNOSIS:  Active Problems:   Chest pain  SECONDARY DIAGNOSIS:   Past Medical History:  Diagnosis Date  . Arthritis   . Atrial flutter ([place]) 02/2011   s/p cardioversion   . Cancer (HCC)    BREAST  . Chest pain    a. H/o cardiac cath x 2, last 2012 -->nl cors;  b. 12/2016 MV: EF 61%, small region of mild perfusion defect in the apical anteroseptal region c/w breast attenuation, no ischemia-->Low risk.  . Chronic kidney disease    acute renal failure secondary to dehydration which is now resolved  . Cystocele   . Diastolic dysfunction    a. 10/2015 Echo: EF 60-65%, no rwma, Gr1 DD, mild MR, mildly dil LA, nl RV fxn, PASP 38mmHg.  [name] GERD (gastroesophageal reflux disease)   . Headache(784.0)    chronic  . Hyperlipidemia   . Hypertension   . Knee fracture   . Light headedness    due to dehydration  . Mobitz type 2 second degree atrioventricular block    a. felt to be 2/2 amiodarone, resolved with decreased amiodarone dose  . Persistent atrial fibrillation ([place])    a. status post multiple DCCV, most recently in 07/2013  . PONV (postoperative nausea and vomiting)    oxycodone and codiene cause N/V   . Pre-syncope   . Tachycardia induced cardiomyopathy ([place])   . Venous insufficiency   . Vertigo    HOSPITAL COURSE:   81 year old female patient with history of atrial flutter, hyperlipidemia, hypertension, GERD, chronic kidney disease presented to the emergency room with chest tightness.  1. Chest tightness due to  Atrial fibrillation Back to sinus rhythm after cardioversion this morning. Continue current home medication  including amiodarone and Eliquis per Dr. Rockey Situ. The patient may be discharged to home today after ambulation per Dr. Rockey Situ.  3. Hypertension, controlled. 4. Hyperlipidemia 5. GERD  I discussed with Dr. Rockey Situ. DISCHARGE CONDITIONS:  Stable, discharge to home today. CONSULTS OBTAINED:  Treatment Team:  Minna Merritts, MD DRUG ALLERGIES:   Allergies  Allergen Reactions  . Iodine Anaphylaxis    NO PROBLEMS WITH BETADINE  . Biaxin [Clarithromycin] Nausea And Vomiting  . Codeine Nausea And Vomiting  . Enalapril Other (See Comments)    unknown  . Fluocinonide Other (See Comments)    Tingling sensation in head and redness to scalp.  . Oxycodone Nausea And Vomiting  . Promethazine Other (See Comments)    Unknown   . Warfarin And Related   . Xarelto [Rivaroxaban] Other (See Comments)    Unknown    DISCHARGE MEDICATIONS:   Allergies as of 07/22/2017      Reactions   Iodine Anaphylaxis   NO PROBLEMS WITH BETADINE   Biaxin [clarithromycin] Nausea And Vomiting   Codeine Nausea And Vomiting   Enalapril Other (See Comments)   unknown   Fluocinonide Other (See Comments)   Tingling sensation in head and redness to scalp.   Oxycodone Nausea And Vomiting   Promethazine Other (See Comments)   Unknown   Warfarin And Related    Xarelto [rivaroxaban] Other (See Comments)   Unknown  Medication List    TAKE these medications   acetaminophen 500 MG tablet Commonly known as:  TYLENOL Take 1,000 mg by mouth every 6 (six) hours as needed (pain).   aluminum-magnesium hydroxide 200-200 MG/5ML suspension Take 20 mLs by mouth every 6 (six) hours as needed for indigestion.   amiodarone 200 MG tablet Commonly known as:  PACERONE Take 1 tablet (200 mg total) by mouth daily.   apixaban 5 MG Tabs tablet Commonly known as:  ELIQUIS Take 1 tablet (5 mg total) by mouth 2 (two) times daily.   cholecalciferol 1000 units tablet Commonly known as:  VITAMIN D Take 1,000 Units by  mouth daily.   Coenzyme Q10 100 MG capsule Take by mouth.   fluticasone 50 MCG/ACT nasal spray Commonly known as:  FLONASE Place 2 sprays into both nostrils daily.   furosemide 20 MG tablet Commonly known as:  LASIX Take 1 tablet (20 mg total) by mouth daily. As needed for edema not more than every other day   pantoprazole 40 MG tablet Commonly known as:  PROTONIX Take 1 tablet (40 mg total) by mouth daily.   pravastatin 20 MG tablet Commonly known as:  PRAVACHOL TAKE ONE (1) TABLET BY MOUTH EVERY DAY   PROBIOTIC ADVANCED PO Take 1 tablet by mouth daily.   sodium chloride 0.65 % Soln nasal spray Commonly known as:  OCEAN Place 1 spray into both nostrils 2 (two) times daily as needed for congestion.   traMADol 50 MG tablet Commonly known as:  ULTRAM Take 1 tablet (50 mg total) by mouth every 8 (eight) hours as needed.        DISCHARGE INSTRUCTIONS:  See AVS. If you experience worsening of your admission symptoms, develop shortness of breath, life threatening emergency, suicidal or homicidal thoughts you must seek medical attention immediately by calling 911 or calling your MD immediately  if symptoms less severe.  You Must read complete instructions/literature along with all the possible adverse reactions/side effects for all the Medicines you take and that have been prescribed to you. Take any new Medicines after you have completely understood and accpet all the possible adverse reactions/side effects.   Please note  You were cared for by a hospitalist during your hospital stay. If you have any questions about your discharge medications or the care you received while you were in the hospital after you are discharged, you can call the unit and asked to speak with the hospitalist on call if the hospitalist that took care of you is not available. Once you are discharged, your primary care physician will handle any further medical issues. Please note that NO REFILLS for any  discharge medications will be authorized once you are discharged, as it is imperative that you return to your primary care physician (or establish a relationship with a primary care physician if you do not have one) for your aftercare needs so that they can reassess your need for medications and monitor your lab values.    On the day of Discharge:  VITAL SIGNS:  Blood pressure (!) 123/55, pulse (!) 55, temperature (!) 97.5 F (36.4 C), temperature source Oral, resp. rate 18, height 5\' 5"  (1.651 m), weight 157 lb 6.4 oz (71.4 kg), SpO2 97 %. PHYSICAL EXAMINATION:  GENERAL:  81 y.o.-year-old patient lying in the bed with no acute distress.  EYES: Pupils equal, round, reactive to light and accommodation. No scleral icterus. Extraocular muscles intact.  HEENT: Head atraumatic, normocephalic. Oropharynx and nasopharynx clear.  NECK:  Supple, no jugular venous distention. No thyroid enlargement, no tenderness.  LUNGS: Normal breath sounds bilaterally, no wheezing, rales,rhonchi or crepitation. No use of accessory muscles of respiration.  CARDIOVASCULAR: S1, S2 normal. No murmurs, rubs, or gallops.  ABDOMEN: Soft, non-tender, non-distended. Bowel sounds present. No organomegaly or mass.  EXTREMITIES: No pedal edema, cyanosis, or clubbing.  NEUROLOGIC: Cranial nerves II through XII are intact. Muscle strength 5/5 in all extremities. Sensation intact. Gait not checked.  PSYCHIATRIC: The patient is alert and oriented x 3.  SKIN: No obvious rash, lesion, or ulcer.  DATA REVIEW:   CBC  Recent Labs Lab 07/22/17 1332  WBC 4.8  HGB 12.6  HCT 37.6  PLT 215    Chemistries   Recent Labs Lab 07/18/17 1520 07/21/17 1043  07/22/17 1332  NA 140  --   < > 138  K 4.8  --   < > 3.8  CL 100  --   < > 107  CO2 24  --   < > 24  GLUCOSE 96  --   < > 182*  BUN 22  --   < > 16  CREATININE 1.15*  --   < > 0.93  CALCIUM 9.3  --   < > 8.6*  MG 2.5*  --   --   --   AST  --  22  --   --   ALT  --  16   --   --   ALKPHOS  --  69  --   --   BILITOT  --  0.7  --   --   < > = values in this interval not displayed.   Microbiology Results  Results for orders placed or performed in visit on 06/08/17  Fecal occult blood, imunochemical     Status: Abnormal   Collection Time: 06/16/17 12:00 AM  Result Value Ref Range Status   Fecal Occult Bld Positive (A) Negative Final    RADIOLOGY:  Dg Chest Port 1 View  Result Date: 07/22/2017 CLINICAL DATA:  Initial evaluation for acute left-sided chest pain. EXAM: PORTABLE CHEST 1 VIEW COMPARISON:  Prior radiograph from 02/03/2017. FINDINGS: Moderate cardiomegaly, stable. Mediastinal silhouette normal. Aortic atherosclerosis. Lungs normally inflated. No overt pulmonary edema. No pleural effusion. No focal infiltrates. No pneumothorax. No acute osseus abnormality. IMPRESSION: 1. Stable cardiomegaly without pulmonary edema. 2. No other active cardiopulmonary disease. 3. Aortic atherosclerosis. Electronically Signed   By: Jeannine Boga M.D.   On: 07/22/2017 00:12     Management plans discussed with the patient, family and they are in agreement.  CODE STATUS: Full Code   TOTAL TIME TAKING CARE OF THIS PATIENT: 33 minutes.    Demetrios Loll M.D on 07/22/2017 at 3:01 PM  Between 7am to 6pm - Pager - [phone]  After 6pm go to [email or web address] - Proofreader  [employer] [facility]  Office  [phone]  CC: Primary care physician; [name], MD   Note: This dictation was prepared with Dragon dictation along with smaller phrase technology. Any transcriptional errors that result from this process are unintentional.

## 2017-07-22 NOTE — Transfer of Care (Signed)
Immediate Anesthesia Transfer of Care Note  [name]  Procedure(s) Performed: Procedure(s): Cardioversion (N/A)  Patient Location: spu  Anesthesia Type:General  Level of Consciousness: awake  Airway & Oxygen Therapy: Patient Spontanous Breathing and Patient connected to nasal cannula oxygen  Post-op Assessment: Report given to RN and Post -op Vital signs reviewed and stable  Post vital signs: Reviewed  Last Vitals:  Vitals:   07/22/17 1011 07/22/17 1012  BP:  (!) 99/55  Pulse: (!) 52 (!) 54  Resp: 13 15  Temp:      Last Pain:  Vitals:   07/22/17 0930  TempSrc: Oral  PainSc: 3       Patients Stated Pain Goal: 1 (15/05/69 7948)  Complications: No apparent anesthesia complications

## 2017-07-22 NOTE — CV Procedure (Signed)
Cardioversion procedure note For atrial fibrillation, persistent  Procedure Details:  Consent: Risks of procedure as well as the alternatives and risks of each were explained to the (patient/caregiver). Consent for procedure obtained.  Time Out: Verified patient identification, verified procedure, site/side was marked, verified correct patient position, special equipment/implants available, medications/allergies/relevent history reviewed, required imaging and test results available. Performed  Patient placed on cardiac monitor, pulse oximetry, supplemental oxygen as necessary.  Sedation given: propofol IV, Dr. Thomas Pacer pads placed anterior and posterior chest.   Cardioverted 1 time(s).  Cardioverted at 150  J. Synchronized biphasic Converted to NSR   Evaluation: Findings: Post procedure EKG shows: NSR Complications: None Patient did tolerate procedure well.  Time Spent Directly with the Patient:  45 minutes   Tim Abir Eroh, M.D., Ph.D. 

## 2017-07-22 NOTE — Progress Notes (Signed)
Patient returned to the floor  At this time, alert and oriented, denies any pain, family at bedside. NSB at 49

## 2017-07-22 NOTE — Anesthesia Postprocedure Evaluation (Signed)
Anesthesia Post Note  Patient: [name]  Procedure(s) Performed: Procedure(s) (LRB): Cardioversion (N/A)  Patient location during evaluation: Cath Lab Anesthesia Type: General Level of consciousness: awake and alert Pain management: pain level controlled Vital Signs Assessment: post-procedure vital signs reviewed and stable Respiratory status: spontaneous breathing, nonlabored ventilation, respiratory function stable and patient connected to nasal cannula oxygen Cardiovascular status: blood pressure returned to baseline and stable Postop Assessment: no signs of nausea or vomiting Anesthetic complications: no     Last Vitals:  Vitals:   07/22/17 1045 07/22/17 1105  BP: (!) 116/54 (!) 123/55  Pulse: (!) 49 (!) 55  Resp: 17 18  Temp:  (!) 36.4 C    Last Pain:  Vitals:   07/22/17 1105  TempSrc: Oral  PainSc:                  Isaak Delmundo S

## 2017-07-22 NOTE — Consult Note (Signed)
Cardiology Consult    Patient ID: [name] [name] MRN: [id number], DOB/AGE: 79/22/1937   Admit date: 07/21/2017 Date of Consult: 07/22/2017  Primary Physician: [name], MD Primary Cardiologist: Jerilynn Mages. Fletcher Anon, MD  Requesting Provider: Q. Chen  Patient Profile    [name] [name] is a 81 y.o. female with a history of PAF/Flutter on amio/eliquis, HTN, HL, chest pain w/ nl cors on cath in 2012 and neg MV in 0/1749, and diastolic dysfunction, who is being seen today for the evaluation of chest pain at the request of Dr. Bridgett Larsson.  Past Medical History   Past Medical History:  Diagnosis Date  . Arthritis   . Atrial flutter ([place]) 02/2011   s/p cardioversion   . Cancer (HCC)    BREAST  . Chest pain    a. H/o cardiac cath x 2, last 2012 -->nl cors;  b. 12/2016 MV: EF 61%, small region of mild perfusion defect in the apical anteroseptal region c/w breast attenuation, no ischemia-->Low risk.  . Chronic kidney disease    acute renal failure secondary to dehydration which is now resolved  . Cystocele   . Diastolic dysfunction    a. 10/2015 Echo: EF 60-65%, no rwma, Gr1 DD, mild MR, mildly dil LA, nl RV fxn, PASP 92mmHg.  [name] GERD (gastroesophageal reflux disease)   . Headache(784.0)    chronic  . Hyperlipidemia   . Hypertension   . Knee fracture   . Light headedness    due to dehydration  . Mobitz type 2 second degree atrioventricular block    a. felt to be 2/2 amiodarone, resolved with decreased amiodarone dose  . Persistent atrial fibrillation ([place])    a. status post multiple DCCV, most recently in 07/2013  . PONV (postoperative nausea and vomiting)    oxycodone and codiene cause N/V   . Pre-syncope   . Tachycardia induced cardiomyopathy ([place])   . Venous insufficiency   . Vertigo     Past Surgical History:  Procedure Laterality Date  . ABDOMINAL HYSTERECTOMY  1990  . APPENDECTOMY    . AUGMENTATION MAMMAPLASTY Bilateral 1986   implants  . AUGMENTATION MAMMAPLASTY  1990  .  AUGMENTATION MAMMAPLASTY  2011  . CARDIAC CATHETERIZATION    . CARDIOVERSION     x 3  . CARDIOVERSION    . CARDIOVERSION N/A 02/07/2017   Procedure: CARDIOVERSION;  Surgeon: Wellington Hampshire, MD;  Location: [facility] ORS;  Service: Cardiovascular;  Laterality: N/A;  . CATARACT EXTRACTION W/PHACO Right 09/21/2016   Procedure: CATARACT EXTRACTION PHACO AND INTRAOCULAR LENS PLACEMENT ([place]);  Surgeon: Birder Robson, MD;  Location: [facility] ORS;  Service: Ophthalmology;  Laterality: Right;  [place] 44.1 AP% 16.5 CDE 7.30 Fluid Pack Lot #[id number]  . CATARACT EXTRACTION W/PHACO Left 10/19/2016   Procedure: CATARACT EXTRACTION PHACO AND INTRAOCULAR LENS PLACEMENT ([facility]);  Surgeon: Birder Robson, MD;  Location: [facility] ORS;  Service: Ophthalmology;  Laterality: Left;  [place] 53.7 AP% 19.5 CDE 10.45 Fluid pack lot # [id number]  . CHOLECYSTECTOMY    . COMBINED AUGMENTATION MAMMAPLASTY AND ABDOMINOPLASTY    . JOINT REPLACEMENT Left 06/04/2013   left knee  . KNEE ARTHROSCOPY Right 08/16/2016   Procedure: ARTHROSCOPY KNEE, tear posterior horn medial meniscus, tear anterior and posterior horns of lateral meniscus, chondromalacia of lateral compartment grade 3 patella and grade 4 medial;  Surgeon: Dereck Leep, MD;  Location: [facility] ORS;  Service: Orthopedics;  Laterality: Right;  . MASTECTOMY  1986   Bilateral with silicone  breast implants,  s/p saline replacements  . Multiple orthopedic procedures    . NOSE SURGERY    . TOTAL KNEE ARTHROPLASTY Left      Allergies  Allergies  Allergen Reactions  . Iodine Anaphylaxis    NO PROBLEMS WITH BETADINE  . Biaxin [Clarithromycin] Nausea And Vomiting  . Codeine Nausea And Vomiting  . Enalapril Other (See Comments)    unknown  . Fluocinonide Other (See Comments)    Tingling sensation in head and redness to scalp.  . Oxycodone Nausea And Vomiting  . Promethazine Other (See Comments)    Unknown   . Warfarin And Related   . Xarelto [Rivaroxaban] Other (See Comments)     Unknown     History of Present Illness    81 y/o ? with a h/o PAF/Flutter on amio/eliquis, HTN, HL, chest pain with nl cors on cath in 2012, breast cancer, and diastolic dysfunction.  There is also a question of tachy-brady syndrome with development of sinus bradycardia in the past on amio 200 mg daily requiring reduction of amio dose to 100 mg daily.  In late 2017, she c/o intermittent chest discomfort @ rest and underwent stress testing in Jan 2018, which was low risk and w/o ischemia.  She had recurrent afib and required DCCV in Feb 2018.  Initially, amio was increased to 200 mg daily however in 04/2017, the dose was again reduced to 100 mg daily in the setting of HRs in the 40's.  On 7/29, she noted elevated heart rates and fatigue, consistent with afib.  She was seen in clinic on 7/30 and her amio was increased back to 200 mg and she was referred to EP given prior h/o tachybrady.  She was seen by Dr. Caryl Comes on 8/2 and he recommended continuing Amio @ 200 mg daily for the time being with a plan for DCCV followed by eventual lowering of amio back to 100 mg daily.  Following that office visit, she returned home and later in the day began to experience mild retrosternal, mid-scapular, and bilateral shoulder pressure.  There were no associated Ss.  She says that her chest felt sore but somewhat improved by placing her hand over her chest and applying pressure.  Symptoms persisted for several hours and so she [facility].  Here, ECG showed afib w/o acute st/t changes.  Trop was nl.  C/p improved with tylenol but never fully abated.  She was able to sleep reasonably well but continues to note mild discomfort this am. Troponins remain nl.  Inpatient Medications    . acidophilus   Oral Daily  . amiodarone  200 mg Oral Daily  . apixaban  5 mg Oral BID  . aspirin  324 mg Oral NOW   Or  . aspirin  300 mg Rectal NOW  . [START ON 07/23/2017] aspirin EC  81 mg Oral Daily  . cholecalciferol  1,000  Units Oral Daily  . fluticasone  2 spray Each Nare Daily  . pantoprazole  40 mg Oral Daily  . pravastatin  20 mg Oral Daily  . sodium chloride flush  3 mL Intravenous Q12H  . sodium chloride flush  3 mL Intravenous Q12H    Family History    Family History  Problem Relation Age of Onset  . Heart disease Mother   . Cancer Father        stomach  . Heart disease Son        found at autopsy  . Cancer Sister  breast  . Breast cancer Sister   . Cancer Maternal Grandmother        breast  . Breast cancer Maternal Grandmother   . Cancer Sister        breast  . Breast cancer Sister   . Breast cancer Sister   . Cancer Sister        breast  . Diabetes Brother   . Ovarian cancer Neg Hx     Social History    Social History   Social History  . Marital status: Married    Spouse name: N/A  . Number of children: 1  . Years of education: N/A   Occupational History  .  Retired   Social History Main Topics  . Smoking status: Never Smoker  . Smokeless tobacco: Never Used  . Alcohol use No  . Drug use: No  . Sexual activity: No   Other Topics Concern  . Not on file   Social History Narrative  . No narrative on file     Review of Systems    General:  +++ fatigue in setting of Afib.  No chills, fever, night sweats or weight changes.  Cardiovascular:  +++ chest pain, no dyspnea on exertion, edema, orthopnea, +++ palpitations, no paroxysmal nocturnal dyspnea. Dermatological: No rash, lesions/masses Respiratory: No cough, dyspnea Urologic: No hematuria, dysuria Abdominal:   No nausea, vomiting, diarrhea, bright red blood per rectum, melena, or hematemesis Neurologic:  No visual changes, wkns, changes in mental status. All other systems reviewed and are otherwise negative except as noted above.  Physical Exam    Blood pressure (!) 123/55, pulse (!) 55, temperature (!) 97.5 F (36.4 C), temperature source Oral, resp. rate 18, height 5\' 5"  (1.651 m), weight 157 lb 6.4  oz (71.4 kg), SpO2 97 %.  General: Pleasant, NAD Psych: Normal affect. Neuro: Alert and oriented X 3. Moves all extremities spontaneously. HEENT: Normal  Neck: Supple without bruits or JVD. Lungs:  Resp regular and unlabored, CTA. Heart: IR, IR, no s3, s4, or murmurs. Abdomen: Soft, non-tender, non-distended, BS + x 4.  Extremities: No clubbing, cyanosis.  Trace bilat ankle edema. DP/PT/Radials 2+ and equal bilaterally.  Labs    Recent Labs  07/21/17 2346 07/22/17 0541  TROPONINI <0.03 <0.03   Lab Results  Component Value Date   WBC 5.3 07/21/2017   HGB 13.2 07/21/2017   HCT 39.3 07/21/2017   MCV 93.9 07/21/2017   PLT 238 07/21/2017     Recent Labs Lab 07/21/17 1043 07/21/17 2346  NA  --  141  K  --  4.1  CL  --  107  CO2  --  26  BUN  --  22*  CREATININE  --  0.95  CALCIUM  --  9.4  PROT 7.4  --   BILITOT 0.7  --   ALKPHOS 69  --   ALT 16  --   AST 22  --   GLUCOSE  --  96   Lab Results  Component Value Date   CHOL 169 01/24/2017   HDL 66.60 01/24/2017   LDLCALC 85 01/24/2017   TRIG 90.0 01/24/2017     Radiology Studies    Dg Chest Port 1 View  Result Date: 07/22/2017 CLINICAL DATA:  Initial evaluation for acute left-sided chest pain. EXAM: PORTABLE CHEST 1 VIEW COMPARISON:  Prior radiograph from 02/03/2017. FINDINGS: Moderate cardiomegaly, stable. Mediastinal silhouette normal. Aortic atherosclerosis. Lungs normally inflated. No overt pulmonary edema. No pleural effusion. No focal infiltrates.  No pneumothorax. No acute osseus abnormality. IMPRESSION: 1. Stable cardiomegaly without pulmonary edema. 2. No other active cardiopulmonary disease. 3. Aortic atherosclerosis. Electronically Signed   By: Jeannine Boga M.D.   On: 07/22/2017 00:12    ECG & Cardiac Imaging    Afib, 89, RBBB, inflat ST dep and TWI - no acute changes.  Assessment & Plan    1.  Precordial chest pain/pressure:  Pt presented with mild chest and midscapular pressure, which  lasted several hours on 8/2, eased up some with tylenol, and was present again this am.  Despite prolonged Ss, ECG is non-acute an troponins have remained nl.  She is on chronic eliquis making PE very unlikely.  She had nl cors on cath in 2012 and had a low risk/non-ischemic MV in 01/2017.  She was recently noted to be back in afib and does have a h/o fatigue and c/p when in afib in the past.  I suspect that that is playing a role now.  She was tentatively scheduled for DCCV next week, but given admission and ongoing symptoms, we will perform DCCV today.  No further ischemic eval warranted @ this time.  2.  Paroxysmal AFib:  Pt had recurrent afib beginning on 7/29.  Her amio was increased and she was seen by EP on 7/30 w/ rec for outpt DCCV and eventual lowering of amio back to 100 mg daily in the setting of a h/o bradycardia when on 200 mg in the past (as recently as 04/2017).  She has been compliant with eliquis and as a result, we will pursue DCCV today.  Plan to cont amio 200 daily for the time being.  Provided that DCCV is successful, she could be d/c'd later today.  3.  HTN:  Stable.  4.  HL:  Cont statin.  Signed, Murray Hodgkins, NP 07/22/2017, 12:27 PM

## 2017-07-22 NOTE — H&P (Addendum)
[place] [facility] [place] NAME: [name] [name]    MR#:  [id number]  DATE OF BIRTH:  November 07, 1936  DATE OF ADMISSION:  07/21/2017  PRIMARY CARE PHYSICIAN: [name], MD   REQUESTING/REFERRING PHYSICIAN:   CHIEF COMPLAINT:   Chief Complaint  Patient presents with  . Chest Pain    HISTORY OF PRESENT ILLNESS: [name] [name]  is a 81 y.o. female with a known history of Atrial flutter, chronic kidney disease, GERD, hyperlipidemia, hypertension, Mobitz type II second-degree AV block, vertigo presented to the emergency room with chest discomfort. Patient has this chest tightness since one day. It is aching in nature 4 out of 10 on a scale of 1-10. Patient had multiple episodes of cardioversion in the past for atrial fibrillation. She was seen by Dr. Caryl Comes and was noted to be in atrial fibrillation and there was a plan for cardioversion in one week from today. But after reaching home she had the chest tightness yesterday and which persisted. No history of any diaphoresis, nausea and vomiting. Has some swelling in the lower extremities and has been retaining fluid. Patient takes Lasix as needed. Troponin was negative and EKG shows atrial fibrillation. Hospitalist service was consulted. Patient follows up with [facility] as outpatient.  PAST MEDICAL HISTORY:   Past Medical History:  Diagnosis Date  . Arthritis   . Atrial flutter ([place]) 02/2011   s/p cardioversion   . Cancer (HCC)    BREAST  . Chronic kidney disease    acute renal failure secondary to dehydration which is now resolved  . Cystocele   . GERD (gastroesophageal reflux disease)   . Headache(784.0)    chronic  . Hyperlipidemia   . Hypertension   . Knee fracture   . Light headedness    due to dehydration  . Mobitz type 2 second degree atrioventricular block    a. felt to be 2/2 amiodarone, resolved with decreased amiodarone dose  . Persistent atrial fibrillation ([place])    a.  status post multiple DCCV, most recently in 07/2013  . PONV (postoperative nausea and vomiting)    oxycodone and codiene cause N/V   . Pre-syncope   . Tachycardia induced cardiomyopathy ([place])   . Venous insufficiency   . Vertigo     PAST SURGICAL HISTORY: Past Surgical History:  Procedure Laterality Date  . ABDOMINAL HYSTERECTOMY  1990  . APPENDECTOMY    . AUGMENTATION MAMMAPLASTY Bilateral 1986   implants  . AUGMENTATION MAMMAPLASTY  1990  . AUGMENTATION MAMMAPLASTY  2011  . CARDIAC CATHETERIZATION    . CARDIOVERSION     x 3  . CARDIOVERSION    . CARDIOVERSION N/A 02/07/2017   Procedure: CARDIOVERSION;  Surgeon: Wellington Hampshire, MD;  Location: [facility] ORS;  Service: Cardiovascular;  Laterality: N/A;  . CATARACT EXTRACTION W/PHACO Right 09/21/2016   Procedure: CATARACT EXTRACTION PHACO AND INTRAOCULAR LENS PLACEMENT ([place]);  Surgeon: Birder Robson, MD;  Location: [facility] ORS;  Service: Ophthalmology;  Laterality: Right;  [place] 44.1 AP% 16.5 CDE 7.30 Fluid Pack Lot #[id number]  . CATARACT EXTRACTION W/PHACO Left 10/19/2016   Procedure: CATARACT EXTRACTION PHACO AND INTRAOCULAR LENS PLACEMENT (IOC);  Surgeon: Birder Robson, MD;  Location: [facility] ORS;  Service: Ophthalmology;  Laterality: Left;  [place] 53.7 AP% 19.5 CDE 10.45 Fluid pack lot # [id number]  . CHOLECYSTECTOMY    . COMBINED AUGMENTATION MAMMAPLASTY AND ABDOMINOPLASTY    . JOINT REPLACEMENT Left 06/04/2013   left knee  . KNEE  ARTHROSCOPY Right 08/16/2016   Procedure: ARTHROSCOPY KNEE, tear posterior horn medial meniscus, tear anterior and posterior horns of lateral meniscus, chondromalacia of lateral compartment grade 3 patella and grade 4 medial;  Surgeon: Dereck Leep, MD;  Location: [facility] ORS;  Service: Orthopedics;  Laterality: Right;  . MASTECTOMY  1986   Bilateral with silicone  breast implants, s/p saline replacements  . Multiple orthopedic procedures    . NOSE SURGERY    . TOTAL KNEE ARTHROPLASTY Left     SOCIAL HISTORY:   Social History  Substance Use Topics  . Smoking status: Never Smoker  . Smokeless tobacco: Never Used  . Alcohol use No    FAMILY HISTORY:  Family History  Problem Relation Age of Onset  . Heart disease Mother   . Cancer Father        stomach  . Heart disease Son        found at autopsy  . Cancer Sister        breast  . Breast cancer Sister   . Cancer Maternal Grandmother        breast  . Breast cancer Maternal Grandmother   . Cancer Sister        breast  . Breast cancer Sister   . Breast cancer Sister   . Cancer Sister        breast  . Diabetes Brother   . Ovarian cancer Neg Hx     DRUG ALLERGIES:  Allergies  Allergen Reactions  . Iodine Anaphylaxis    NO PROBLEMS WITH BETADINE  . Biaxin [Clarithromycin] Nausea And Vomiting  . Codeine Nausea And Vomiting  . Enalapril Other (See Comments)    unknown  . Fluocinonide Other (See Comments)    Tingling sensation in head and redness to scalp.  . Oxycodone Nausea And Vomiting  . Promethazine Other (See Comments)    Unknown   . Warfarin And Related   . Xarelto [Rivaroxaban] Other (See Comments)    Unknown     REVIEW OF SYSTEMS:   CONSTITUTIONAL: No fever, fatigue or weakness.  EYES: No blurred or double vision.  EARS, NOSE, AND THROAT: No tinnitus or ear pain.  RESPIRATORY: No cough, shortness of breath, wheezing or hemoptysis.  CARDIOVASCULAR: Has chest tightness,  No orthopnea, edema.  GASTROINTESTINAL: No nausea, vomiting, diarrhea or abdominal pain.  GENITOURINARY: No dysuria, hematuria.  ENDOCRINE: No polyuria, nocturia,  HEMATOLOGY: No anemia, easy bruising or bleeding SKIN: No rash or lesion. MUSCULOSKELETAL: No joint pain or arthritis.   NEUROLOGIC: No tingling, numbness, weakness.  PSYCHIATRY: No anxiety or depression.   MEDICATIONS AT HOME:  Prior to Admission medications   Medication Sig Start Date End Date Taking? Authorizing Provider  acetaminophen (TYLENOL) 500 MG tablet Take 1,000 mg by  mouth every 6 (six) hours as needed (pain).     [provider]  aluminum-magnesium hydroxide 200-200 MG/5ML suspension Take 20 mLs by mouth every 6 (six) hours as needed for indigestion.     [provider]  amiodarone (PACERONE) 200 MG tablet Take 1 tablet (200 mg total) by mouth daily. 07/18/17   Rise Mu, PA-C  apixaban (ELIQUIS) 5 MG TABS tablet Take 1 tablet (5 mg total) by mouth 2 (two) times daily. 01/30/16   Wellington Hampshire, MD  cholecalciferol (VITAMIN D) 1000 units tablet Take 1,000 Units by mouth daily.    [provider]  Coenzyme Q10 100 MG capsule Take by mouth.    [provider]  fluticasone (FLONASE) 50 MCG/ACT nasal spray Place 2 sprays into both nostrils daily. 12/29/15   Rubbie Battiest, NP  furosemide (LASIX) 20 MG tablet Take 1 tablet (20 mg total) by mouth daily. As needed for edema not more than every other day 08/31/16   [name], MD  pantoprazole (PROTONIX) 40 MG tablet Take 1 tablet (40 mg total) by mouth daily. 12/31/16   Wellington Hampshire, MD  pravastatin (PRAVACHOL) 20 MG tablet TAKE ONE (1) TABLET BY MOUTH EVERY DAY 04/12/17   [name], MD  Probiotic Product (PROBIOTIC ADVANCED PO) Take 1 tablet by mouth daily.    [provider]  sodium chloride (OCEAN) 0.65 % SOLN nasal spray Place 1 spray into both nostrils 2 (two) times daily as needed for congestion.     [provider]  traMADol (ULTRAM) 50 MG tablet Take 1 tablet (50 mg total) by mouth every 8 (eight) hours as needed. 04/08/17   [name], MD      PHYSICAL EXAMINATION:   VITAL SIGNS: Blood pressure 134/65, pulse (!) 49, resp. rate 16, weight 72.1 kg (159 lb), SpO2 98 %.  GENERAL:  81 y.o.-year-old patient lying in the bed with no acute distress.  EYES: Pupils equal, round, reactive to light and accommodation. No scleral icterus. Extraocular muscles intact.  HEENT: Head atraumatic, normocephalic. Oropharynx and nasopharynx clear.  NECK:   Supple, no jugular venous distention. No thyroid enlargement, no tenderness.  LUNGS: Normal breath sounds bilaterally, no wheezing, rales,rhonchi or crepitation. No use of accessory muscles of respiration.  CARDIOVASCULAR: S1, S2 normal. No murmurs, rubs, or gallops.  ABDOMEN: Soft, nontender, nondistended. Bowel sounds present. No organomegaly or mass.  EXTREMITIES: Has pedal edema,  No cyanosis, or clubbing.  NEUROLOGIC: Cranial nerves II through XII are intact. Muscle strength 5/5 in all extremities. Sensation intact. Gait not checked.  PSYCHIATRIC: The patient is alert and oriented x 3.  SKIN: No obvious rash, lesion, or ulcer.   LABORATORY PANEL:   CBC  Recent Labs Lab 07/21/17 2346  WBC 5.3  HGB 13.2  HCT 39.3  PLT 238  MCV 93.9  MCH 31.7  MCHC 33.7  RDW 14.1   ------------------------------------------------------------------------------------------------------------------  Chemistries   Recent Labs Lab 07/18/17 1520 07/21/17 2346  NA 140 141  K 4.8 4.1  CL 100 107  CO2 24 26  GLUCOSE 96 96  BUN 22 22*  CREATININE 1.15* 0.95  CALCIUM 9.3 9.4  MG 2.5*  --    ------------------------------------------------------------------------------------------------------------------ estimated creatinine clearance is 47.8 mL/min (by C-G formula based on SCr of 0.95 mg/dL). ------------------------------------------------------------------------------------------------------------------ No results for input(s): TSH, T4TOTAL, T3FREE, THYROIDAB in the last 72 hours.  Invalid input(s): FREET3   Coagulation profile No results for input(s): INR, PROTIME in the last 168 hours. ------------------------------------------------------------------------------------------------------------------- No results for input(s): DDIMER in the last 72 hours. -------------------------------------------------------------------------------------------------------------------  Cardiac  Enzymes  Recent Labs Lab 07/21/17 2346  TROPONINI <0.03   ------------------------------------------------------------------------------------------------------------------ Invalid input(s): POCBNP  ---------------------------------------------------------------------------------------------------------------  Urinalysis    Component Value Date/Time   COLORURINE YELLOW 11/02/2016 1024   APPEARANCEUR CLEAR 11/02/2016 1024   APPEARANCEUR Clear 09/14/2014 0334   LABSPEC 1.010 11/02/2016 1024   LABSPEC 1.006 09/14/2014 0334   PHURINE 7.5 11/02/2016 1024   GLUCOSEU NEGATIVE 11/02/2016 1024   HGBUR NEGATIVE 11/02/2016 1024   BILIRUBINUR NEGATIVE 11/02/2016 1024   BILIRUBINUR Negative 11/01/2016 1834   BILIRUBINUR Negative 09/14/2014 [place] 11/02/2016 1024   PROTEINUR negative 11/01/2016 1834  PROTEINUR Negative 09/14/2014 0334   UROBILINOGEN 0.2 11/02/2016 1024   NITRITE NEGATIVE 11/02/2016 1024   LEUKOCYTESUR NEGATIVE 11/02/2016 1024   LEUKOCYTESUR Trace 09/14/2014 0334     RADIOLOGY: Dg Chest Port 1 View  Result Date: 07/22/2017 CLINICAL DATA:  Initial evaluation for acute left-sided chest pain. EXAM: PORTABLE CHEST 1 VIEW COMPARISON:  Prior radiograph from 02/03/2017. FINDINGS: Moderate cardiomegaly, stable. Mediastinal silhouette normal. Aortic atherosclerosis. Lungs normally inflated. No overt pulmonary edema. No pleural effusion. No focal infiltrates. No pneumothorax. No acute osseus abnormality. IMPRESSION: 1. Stable cardiomegaly without pulmonary edema. 2. No other active cardiopulmonary disease. 3. Aortic atherosclerosis. Electronically Signed   By: Jeannine Boga M.D.   On: 07/22/2017 00:12    EKG: Orders placed or performed during the hospital encounter of 07/21/17  . ED EKG within 10 minutes  . ED EKG within 10 minutes  . EKG 12-Lead  . EKG 12-Lead  . ED EKG  . ED EKG    IMPRESSION AND PLAN: 81 year old female patient with history of  atrial flutter, hyperlipidemia, hypertension, GERD, chronic kidney disease presented to the emergency room with chest tightness. Admitting diagnosis 1. Chest pain 2. Atrial fibrillation 3. Hypertension 4. Hyperlipidemia 5. GERD Treatment plan Admit patient to telemetry observation bed Cardiology consultation Serial cardiac enzymes Check echocardiogram Resume anticoagulation with oral eliquis.  All the records are reviewed and case discussed with ED provider. Management plans discussed with the patient, family and they are in agreement.  CODE STATUS:FULL CODE Surrogate decision maker : Daughter Code Status History    Date Active Date Inactive Code Status Order ID Comments User Context   08/16/2016  2:42 PM 08/16/2016  6:32 PM Full Code [id number]  Dereck Leep, MD Inpatient       TOTAL TIME TAKING CARE OF THIS PATIENT: 50 minutes.    Saundra Shelling M.D on 07/22/2017 at 1:40 AM  Between 7am to 6pm - Pager - [phone]  After 6pm go to [email or web address] - password [email or web address] [place] [facility]  Office  [phone]  CC: Primary care physician; [name], MD

## 2017-07-22 NOTE — Telephone Encounter (Signed)
-----   Message from Blain Pais sent at 07/22/2017 11:46 AM EDT ----- Regarding: tcm/ph 8/16 2:20 Dr. Fletcher Anon

## 2017-07-22 NOTE — Progress Notes (Signed)
Ambulate  around nurses station with pt she tolerated, denies any pain, heart rate SB at 55., patient is being discharge home, discharge instruction provided, iv removed tele removed

## 2017-07-22 NOTE — Progress Notes (Signed)
Patient is alert and oriented, denies any pain at this time, patient is off the floor to special  For cardioversion

## 2017-07-22 NOTE — Anesthesia Preprocedure Evaluation (Signed)
Anesthesia Evaluation  Patient identified by MRN, date of birth, ID band Patient awake    Reviewed: Allergy & Precautions, NPO status , Patient's Chart, lab work & pertinent test results, reviewed documented beta blocker date and time   History of Anesthesia Complications (+) PONV and history of anesthetic complications  Airway Mallampati: II  TM Distance: >3 FB     Dental  (+) Chipped   Pulmonary           Cardiovascular hypertension, Pt. on medications + dysrhythmias Atrial Fibrillation      Neuro/Psych  Headaches, Anxiety  Neuromuscular disease    GI/Hepatic hiatal hernia, GERD  ,  Endo/Other    Renal/GU Renal disease     Musculoskeletal  (+) Arthritis ,   Abdominal   Peds  Hematology   Anesthesia Other Findings   Reproductive/Obstetrics                             Anesthesia Physical Anesthesia Plan  ASA: III  Anesthesia Plan: General   Post-op Pain Management:    Induction: Intravenous  PONV Risk Score and Plan:   Airway Management Planned:   Additional Equipment:   Intra-op Plan:   Post-operative Plan:   Informed Consent: I have reviewed the patients History and Physical, chart, labs and discussed the procedure including the risks, benefits and alternatives for the proposed anesthesia with the patient or authorized representative who has indicated his/her understanding and acceptance.     Plan Discussed with: CRNA  Anesthesia Plan Comments:         Anesthesia Quick Evaluation

## 2017-07-22 NOTE — Anesthesia Post-op Follow-up Note (Cosign Needed)
Anesthesia QCDR form completed.        

## 2017-07-22 NOTE — Care Management Obs Status (Signed)
[place] NOTIFICATION   Patient Details  Name: [name] [name] MRN: [id number] Date of Birth: Oct 31, 1936   Medicare Observation Status Notification Given:  No < 24 hours   Katrina Stack, RN 07/22/2017, 11:50 AM

## 2017-07-22 NOTE — Telephone Encounter (Signed)
Patient contacted regarding discharge from [facility] on 07/22/17.  Patient understands to follow up with Dr. Fletcher Anon on 08/04/17 at 2:20 at [facility]. Patient understands discharge instructions? Yes  Patient understands medications and regiment? yes Patient understands to bring all medications to this visit? yes

## 2017-07-25 ENCOUNTER — Telehealth: Payer: Self-pay | Admitting: Cardiovascular Disease

## 2017-07-25 NOTE — Telephone Encounter (Signed)
no

## 2017-07-25 NOTE — Telephone Encounter (Signed)
Pt calling stating she got her heart shocked Friday Since then she's had some shoulder pain since then It is not going away. BP is also staying high  Most time when she gets it shocked BP goes down after but this time around it is not going down either.  148-151 She is concerned  Please advise.

## 2017-07-25 NOTE — Telephone Encounter (Signed)
Cardiology has the  TCM on this patient and is following up on  08/04/17 with Dr. Fletcher Anon do you want a HFU also with patient?

## 2017-07-25 NOTE — Telephone Encounter (Signed)
Reviewed with pt. She will continue tylenol for pain and resume amlodipine while SBP >100. Pt will call back if sx do not improve.

## 2017-07-25 NOTE — Telephone Encounter (Signed)
I agree with you

## 2017-07-25 NOTE — Telephone Encounter (Signed)
S/w pt who reports pain in shoulder blades and elevated BP 140s-150s since 8/3. Per July 30 AVS, amlodipine was d/c'd and amiodarone increased to 200mg  qd.  She was referred to Dr. Caryl Comes for possible afib ablation. At [place] 8/2, pt was scheduled for 8/10 DCCV. Pt experienced CP and SOB on the evening of 8/2 and proceeded to ER, was admitted, had DCCV 8/3 then d/c'd home.  Since discharge, pt has had pain in between her shoulder blades and SBP 140s-150s, HR low 60s. She has been taking tylenol 1000mg  q4hours with only slight improvement in pain. Per [facility] Dunn's 7/30 notes, pt was to hold amlodipine if SBP <100 as BP had been soft. She restarted amlodipine, 2.5mg  in the AM and  5mg  in the PM, on Aug 4. Little improvement in BP.  Discussed how pain could be from the DCCV and how pain can cause increase in BP. She will continue to monitor pain and BP, take tylenol and amlodipine as prescribed.  She asked if Dr. Fletcher Anon could prescribe stronger pain medication. Advised pt she would need to f/u with PCP for this.  Forward to Dr. Fletcher Anon for further advice regarding BP.

## 2017-07-29 ENCOUNTER — Ambulatory Visit: Admit: 2017-07-29 | Payer: Medicare Other | Admitting: Cardiovascular Disease

## 2017-07-29 SURGERY — CARDIOVERSION (CATH LAB)
Anesthesia: General

## 2017-08-01 ENCOUNTER — Ambulatory Visit (INDEPENDENT_AMBULATORY_CARE_PROVIDER_SITE_OTHER): Payer: Medicare Other | Admitting: Family Medicine

## 2017-08-01 ENCOUNTER — Encounter: Payer: Self-pay | Admitting: Family Medicine

## 2017-08-01 ENCOUNTER — Ambulatory Visit (INDEPENDENT_AMBULATORY_CARE_PROVIDER_SITE_OTHER): Payer: Medicare Other

## 2017-08-01 ENCOUNTER — Telehealth: Payer: Self-pay

## 2017-08-01 VITALS — BP 132/70 | HR 56 | Temp 97.8°F | Wt 159.0 lb

## 2017-08-01 DIAGNOSIS — M542 Cervicalgia: Secondary | ICD-10-CM

## 2017-08-01 DIAGNOSIS — M4182 Other forms of scoliosis, cervical region: Secondary | ICD-10-CM | POA: Diagnosis not present

## 2017-08-01 DIAGNOSIS — M47812 Spondylosis without myelopathy or radiculopathy, cervical region: Secondary | ICD-10-CM | POA: Diagnosis not present

## 2017-08-01 DIAGNOSIS — M898X1 Other specified disorders of bone, shoulder: Secondary | ICD-10-CM | POA: Diagnosis not present

## 2017-08-01 MED ORDER — BACLOFEN 10 MG PO TABS: 5.0000 mg | ORAL_TABLET | Freq: Three times a day (TID) | ORAL | 0 refills | Status: DC | PRN

## 2017-08-01 NOTE — Patient Instructions (Signed)
Nice to see you. We'll get an x-ray to evaluate further. You can try the muscle relaxer called baclofen. This was sent to your pharmacy. Please be wary as this may make you drowsy. If you develop worsening pain or you develop numbness, weakness, chest pain, shortness breath, or any new or changing symptoms please seek medical attention immediately.

## 2017-08-01 NOTE — Telephone Encounter (Signed)
Pt is aware and agreeable to normal results. She is also aware that a copy of labs have been forwarded to PCP Dr. Derrel Nip for her records. She is agreeable

## 2017-08-01 NOTE — Progress Notes (Signed)
Tommi Rumps, MD Phone: [phone]  [name] [name] is a 81 y.o. female who presents today for same-day visit.  Patient notes for a week or so she has had right scapular discomfort. Started after hospitalization for A. fib and chest pain. She ruled out for cardiac ischemia and underwent cardioversion that converted her to normal sinus rhythm. She notes she has done well with that since then. No palpitations, chest pain, or shortness of breath. She's not been dizzy. She notes over the weekend following the cardioversion she had developed right scapular discomfort. She contacted the cardiologist and they advised Tylenol and monitoring. Notes it feels like a spasm. Occasionally does radiate down the posterior aspect of her right arm in her tricep. Notes it feels tight if she moves it. Some specific movements do bother it. She's not had any chest pain or shortness of breath. She is taking Eliquis. She does report intermittent posterior neck pain though none currently. Discharge summary from her hospitalization has been reviewed.  PMH: nonsmoker.   ROS see history of present illness  Objective  Physical Exam Vitals:   08/01/17 0942  BP: 132/70  Pulse: (!) 56  Temp: 97.8 F (36.6 C)  SpO2: 97%    BP Readings from Last 3 Encounters:  08/01/17 132/70  07/22/17 (!) 123/55  07/21/17 124/60   Wt Readings from Last 3 Encounters:  08/01/17 159 lb (72.1 kg)  07/22/17 157 lb 6.4 oz (71.4 kg)  07/21/17 159 lb (72.1 kg)    Physical Exam  Constitutional: No distress.  Cardiovascular: Normal rate, regular rhythm and normal heart sounds.   Pulmonary/Chest: Effort normal and breath sounds normal.  Musculoskeletal: She exhibits no edema.  No midline spine tenderness, no midline spine step-off, there is muscular tenderness and spasm in the infraspinatus muscle area and right rhomboid area  Neurological: She is alert. Gait normal.  5/5 strength bilateral biceps, triceps, grip, sensation  light touch intact bilateral upper extremities  Skin: Skin is warm and dry. She is not diaphoretic.     Assessment/Plan: Please see individual problem list.  Pain of right scapula Patient with a week or so of likely muscular pain in her right scapula. She's tender over the right infraspinatus muscle and rhomboids. Suspect strain though I'm unsure exactly how she did this. She has no cardiac related symptoms. She is in sinus rhythm today on exam. She has 2+ radial pulses bilaterally. Discussed imaging her neck given history of neck pain and given the fact that the pain was down the back of her arm at times. We'll treat with baclofen as well. They did ask about narcotics for this and I advised that would not be an indicated treatment. Also advised against NSAID use given that she is on Eliquis. We'll see what the x-ray shows and then determine the next step. Given return precautions.   Orders Placed This Encounter  Procedures  . DG Cervical Spine Complete    Standing Status:   Future    Number of Occurrences:   1    Standing Expiration Date:   10/01/2018    Order Specific Question:   Reason for Exam (SYMPTOM  OR DIAGNOSIS REQUIRED)    Answer:   intermittent neck pain, persistent infraspinatus pain and radiating down posterior right arm    Order Specific Question:   Preferred imaging location?    Answer:   [facility] Specific Question:   Radiology Contrast Protocol - do NOT remove file path  Answer:   [email or web address]    Meds ordered this encounter  Medications  . baclofen (LIORESAL) 10 MG tablet    Sig: Take 0.5 tablets (5 mg total) by mouth 3 (three) times daily as needed for muscle spasms.    Dispense:  20 each    Refill:  0   Tommi Rumps, MD [place]

## 2017-08-01 NOTE — Assessment & Plan Note (Addendum)
Patient with a week or so of likely muscular pain in her right scapula. She's tender over the right infraspinatus muscle and rhomboids. Suspect strain though I'm unsure exactly how she did this. She has no cardiac related symptoms. She is in sinus rhythm today on exam. She has 2+ radial pulses bilaterally. Discussed imaging her neck given history of neck pain and given the fact that the pain was down the back of her arm at times. We'll treat with baclofen as well. They did ask about narcotics for this and I advised that would not be an indicated treatment. Also advised against NSAID use given that she is on Eliquis. We'll see what the x-ray shows and then determine the next step. Given return precautions.

## 2017-08-02 DIAGNOSIS — R195 Other fecal abnormalities: Secondary | ICD-10-CM | POA: Diagnosis not present

## 2017-08-02 DIAGNOSIS — K219 Gastro-esophageal reflux disease without esophagitis: Secondary | ICD-10-CM | POA: Diagnosis not present

## 2017-08-04 ENCOUNTER — Ambulatory Visit (INDEPENDENT_AMBULATORY_CARE_PROVIDER_SITE_OTHER): Payer: Medicare Other | Admitting: Cardiovascular Disease

## 2017-08-04 ENCOUNTER — Encounter: Payer: Self-pay | Admitting: Cardiovascular Disease

## 2017-08-04 VITALS — BP 132/62 | HR 57 | Ht 64.0 in | Wt 158.0 lb

## 2017-08-04 DIAGNOSIS — I481 Persistent atrial fibrillation: Secondary | ICD-10-CM

## 2017-08-04 DIAGNOSIS — I779 Disorder of arteries and arterioles, unspecified: Secondary | ICD-10-CM

## 2017-08-04 DIAGNOSIS — I10 Essential (primary) hypertension: Secondary | ICD-10-CM | POA: Diagnosis not present

## 2017-08-04 DIAGNOSIS — I739 Peripheral vascular disease, unspecified: Secondary | ICD-10-CM

## 2017-08-04 DIAGNOSIS — I4819 Other persistent atrial fibrillation: Secondary | ICD-10-CM

## 2017-08-04 NOTE — Progress Notes (Signed)
Cardiology Office Note   Date:  08/04/2017   ID:  [name] [name], DOB 02-Jan-1936, MRN [id number]  PCP:  [name], MD  Cardiologist:   [name], MD   Chief Complaint  Patient presents with  . other    ED follow up. Patient c/o shoulder pain. Meds reviewed verbally with patient.       History of Present Illness: [name] [name] is a 81 y.o. female who presents for a followup visit regarding persistent atrial fibrillation.   She has known history of persistent atrial fibrillation/flutter status post multiple cardioversions in the past. 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 She had cardiac catheterization twice in the past without significant coronary artery disease.  Most recent cardioversion was in Feb of 2018.  Most recent Echocardiogram in November 2016 showed normal LV systolic function with mildly dilated left atrium, mild mitral regurgitation, and mild to moderate pulmonary hypertension.  She had intermittent Mobitz 2 AV block which resolved after decreasing the dose of amiodarone to 100 mg once daily.  She is on long-term anticoagulation with Eliquis.   She had recent recurrent atrial fibrillation which was treated successfully with cardioversion. Post cardioversion, she developed right sided chest and back pain worse with certain movements. She was given baclofen by her PCP but she did not tolerate due to dizziness. She had a recent x-ray done that showed osteoarthritis of the neck and there was also incidental finding of left carotid calcifications. No recurrent palpitations or shortness of breath.   Past Medical History:  Diagnosis Date  . Arthritis   . Atrial flutter ([place]) 02/2011   s/p cardioversion   . Cancer (HCC)    BREAST  . Chest pain    a. H/o cardiac cath x 2, last 2012 -->nl cors;  b. 12/2016 MV: EF 61%, small region of mild perfusion defect in the apical anteroseptal region c/w breast attenuation, no  ischemia-->Low risk.  . Chronic kidney disease    acute renal failure secondary to dehydration which is now resolved  . Cystocele   . Diastolic dysfunction    a. 10/2015 Echo: EF 60-65%, no rwma, Gr1 DD, mild MR, mildly dil LA, nl RV fxn, PASP 58mmHg.  [name] GERD (gastroesophageal reflux disease)   . Headache(784.0)    chronic  . Hyperlipidemia   . Hypertension   . Knee fracture   . Light headedness    due to dehydration  . Mobitz type 2 second degree atrioventricular block    a. felt to be 2/2 amiodarone, resolved with decreased amiodarone dose  . Persistent atrial fibrillation ([place])    a. status post multiple DCCV, most recently in 07/2013  . PONV (postoperative nausea and vomiting)    oxycodone and codiene cause N/V   . Pre-syncope   . 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 Venous insufficiency   . Vertigo     Past Surgical History:  Procedure Laterality Date  . ABDOMINAL HYSTERECTOMY  1990  . APPENDECTOMY    . AUGMENTATION MAMMAPLASTY Bilateral 1986   implants  . AUGMENTATION MAMMAPLASTY  1990  . AUGMENTATION MAMMAPLASTY  2011  . CARDIAC CATHETERIZATION    . CARDIOVERSION     x 3  . CARDIOVERSION    . CARDIOVERSION N/A 02/07/2017   Procedure: CARDIOVERSION;  Surgeon: Wellington Hampshire, MD;  Location: [facility] ORS;  Service: Cardiovascular;  Laterality: N/A;  . CARDIOVERSION N/A 07/22/2017   Procedure: Cardioversion;  Surgeon: Minna Merritts,  MD;  Location: [facility] ORS;  Service: Cardiovascular;  Laterality: N/A;  . CATARACT EXTRACTION W/PHACO Right 09/21/2016   Procedure: CATARACT EXTRACTION PHACO AND INTRAOCULAR LENS PLACEMENT ([place]);  Surgeon: Birder Robson, MD;  Location: [facility] ORS;  Service: Ophthalmology;  Laterality: Right;  [place] 44.1 AP% 16.5 CDE 7.30 Fluid Pack Lot #[id number]  . CATARACT EXTRACTION W/PHACO Left 10/19/2016   Procedure: CATARACT EXTRACTION PHACO AND INTRAOCULAR LENS PLACEMENT ([facility]);  Surgeon: Birder Robson, MD;  Location: [facility] OR[facility];  Service:  Ophthalmology;  Laterality: Left;  [place] 53.7 AP% 19.5 CDE 10.45 Fluid pack lot # [id number]  . CHOLECYSTECTOMY    . COMBINED AUGMENTATION MAMMAPLASTY AND ABDOMINOPLASTY    . JOINT REPLACEMENT Left 06/04/2013   left knee  . KNEE ARTHROSCOPY Right 08/16/2016   Procedure: ARTHROSCOPY KNEE, tear posterior horn medial meniscus, tear anterior and posterior horns of lateral meniscus, chondromalacia of lateral compartment grade 3 patella and grade 4 medial;  Surgeon: Dereck Leep, MD;  Location: [facility] ORS;  Service: Orthopedics;  Laterality: Right;  . MASTECTOMY  1986   Bilateral with silicone  breast implants, s/p saline replacements  . Multiple orthopedic procedures    . NOSE SURGERY    . TOTAL KNEE ARTHROPLASTY Left      Current Outpatient Prescriptions  Medication Sig Dispense Refill  . acetaminophen (TYLENOL) 500 MG tablet Take 1,000 mg by mouth every 6 (six) hours as needed (pain).     [name] aluminum-magnesium hydroxide 200-200 MG/5ML suspension Take 20 mLs by mouth every 6 (six) hours as needed for indigestion.     [name] amiodarone (PACERONE) 200 MG tablet Take 1 tablet (200 mg total) by mouth daily. 30 tablet 6  . amLODipine (NORVASC) 5 MG tablet Take 5 mg by mouth daily.    [name] apixaban (ELIQUIS) 5 MG TABS tablet Take 1 tablet (5 mg total) by mouth 2 (two) times daily. 60 tablet 6  . cholecalciferol (VITAMIN D) 1000 units tablet Take 1,000 Units by mouth daily.    . Coenzyme Q10 100 MG capsule Take by mouth.    . fluticasone (FLONASE) 50 MCG/ACT nasal spray Place 2 sprays into both nostrils daily. 16 g 0  . furosemide (LASIX) 20 MG tablet Take 1 tablet (20 mg total) by mouth daily. As needed for edema not more than every other day 45 tablet 1  . pantoprazole (PROTONIX) 40 MG tablet Take 1 tablet (40 mg total) by mouth daily. 30 tablet 11  . pravastatin (PRAVACHOL) 20 MG tablet TAKE ONE (1) TABLET BY MOUTH EVERY DAY 90 tablet 3  . Probiotic Product (PROBIOTIC ADVANCED PO) Take 1 tablet by mouth  daily.    . sodium chloride (OCEAN) 0.65 % SOLN nasal spray Place 1 spray into both nostrils 2 (two) times daily as needed for congestion.      No current facility-administered medications for this visit.     Allergies:   Iodine; Biaxin [clarithromycin]; Codeine; Enalapril; Fluocinonide; Oxycodone; Promethazine; Warfarin and related; and Xarelto [rivaroxaban]    Social History:  The patient  reports that she has never smoked. She has never used smokeless tobacco. She reports that she does not drink alcohol or use drugs.   Family History:  The patient's family history includes Breast cancer in her maternal grandmother, sister, sister, and sister; Cancer in her father, maternal grandmother, sister, sister, and sister; Diabetes in her brother; Heart disease in her mother and son.    ROS:  Please see the history of present illness.   Otherwise, review  of systems are positive for none.   All other systems are reviewed and negative.    PHYSICAL EXAM: VS:  BP 132/62 (BP Location: Left Arm, Patient Position: Sitting, Cuff Size: Normal)   Pulse (!) 57   Ht 5\' 4"  (1.626 m)   Wt 158 lb (71.7 kg)   BMI 27.12 kg/m  , BMI Body mass index is 27.12 kg/m. GEN: Well nourished, well developed, in no acute distress  HEENT: normal  Neck: no JVD, carotid bruits, or masses Cardiac: RRR; no murmurs, rubs, or gallops,no edema  Respiratory:  clear to auscultation bilaterally, normal work of breathing GI: soft, nontender, nondistended, + BS MS: no deformity or atrophy  Skin: warm and dry, no rash Neuro:  Strength and sensation are intact Psych: euthymic mood, full affect   EKG:  EKG is ordered today. The ekg ordered today demonstrates  sinus bradycardia with right bundle branch block. Inferolateral T wave changes suggestive of ischemia.  Recent Labs: 07/18/2017: Magnesium 2.5 07/21/2017: ALT 16; TSH 3.500 07/22/2017: BUN 16; Creatinine, Ser 0.93; Hemoglobin 12.6; Platelets 215; Potassium 3.8; Sodium 138      Lipid Panel    Component Value Date/Time   CHOL 147 07/22/2017 1332   TRIG 51 07/22/2017 1332   HDL 63 07/22/2017 1332   CHOLHDL 2.3 07/22/2017 1332   VLDL 10 07/22/2017 1332   LDLCALC 74 07/22/2017 1332      Wt Readings from Last 3 Encounters:  08/04/17 158 lb (71.7 kg)  08/01/17 159 lb (72.1 kg)  07/22/17 157 lb 6.4 oz (71.4 kg)       ASSESSMENT AND PLAN:  1.  Persistent atrial fibrillation:  She had recurrent atrial fibrillation while she was on 100 mg of amiodarone. She is now on 200 mg once daily. She did have bradycardia in the past on this dose and will have to monitor her closely. If she develops symptomatic bradycardia, she might require a permanent pacemaker. A. fib ablation can also be considered if atrial fibrillation becomes more frequent.  She is tolerating anticoagulation with Eliquis.  2. Essential hypertension: Blood pressure is well controlled on amlodipine.  3. History of tachycardia-induced cardiomyopathy. Most recent ejection fraction was normal .  4. Left carotid calcifications: I requested carotid Doppler for evaluation.  Disposition:   FU with me in 4 months  Signed,  [name], MD  08/04/2017 3:57 PM    [place]

## 2017-08-04 NOTE — Patient Instructions (Signed)
Medication Instructions:  Your physician recommends that you continue on your current medications as directed. Please refer to the Current Medication list given to you today.   Labwork: none  Testing/Procedures: Your physician has requested that you have a carotid duplex. This test is an ultrasound of the carotid arteries in your neck. It looks at blood flow through these arteries that supply the brain with blood. Allow one hour for this exam. There are no restrictions or special instructions.    Follow-Up: Your physician recommends that you schedule a follow-up appointment in: 4 months with Dr. Fletcher Anon.    Any Other Special Instructions Will Be Listed Below (If Applicable).     If you need a refill on your cardiac medications before your next appointment, please call your pharmacy.

## 2017-08-05 ENCOUNTER — Ambulatory Visit: Payer: Medicare Other

## 2017-08-05 DIAGNOSIS — I779 Disorder of arteries and arterioles, unspecified: Secondary | ICD-10-CM | POA: Diagnosis not present

## 2017-08-05 DIAGNOSIS — I739 Peripheral vascular disease, unspecified: Principal | ICD-10-CM

## 2017-08-05 LAB — VAS US CAROTID
LEFT ECA DIAS: 0 cm/s
LEFT VERTEBRAL DIAS: -12 cm/s
Left CCA dist dias: -16 cm/s
Left CCA dist sys: -108 cm/s
Left CCA prox dias: 11 cm/s
Left CCA prox sys: 110 cm/s
Left ICA dist dias: -16 cm/s
Left ICA dist sys: -86 cm/s
Left ICA prox dias: 14 cm/s
Left ICA prox sys: 92 cm/s
RIGHT ECA DIAS: 0 cm/s
RIGHT VERTEBRAL DIAS: -8 cm/s
Right CCA prox dias: 15 cm/s
Right CCA prox sys: 81 cm/s
Right cca dist sys: 83 cm/s

## 2017-08-17 ENCOUNTER — Telehealth: Payer: Self-pay | Admitting: Cardiovascular Disease

## 2017-08-17 ENCOUNTER — Other Ambulatory Visit: Payer: Self-pay

## 2017-08-17 MED ORDER — AMLODIPINE BESYLATE 2.5 MG PO TABS: ORAL_TABLET | ORAL | 1 refills | Status: DC

## 2017-08-17 NOTE — Telephone Encounter (Signed)
Pt called requesting amlodipine refill. States there are two different directions on the prescription bottle. Confirmed pt takes 2.5mg  tablets, 1 tablet in the morning and 2 tablets in the evening. Refill submitted to [facility], clarifying instructions.

## 2017-08-18 ENCOUNTER — Ambulatory Visit: Payer: Medicare Other | Admitting: Cardiovascular Disease

## 2017-08-18 ENCOUNTER — Telehealth: Payer: Self-pay | Admitting: Cardiovascular Disease

## 2017-08-18 ENCOUNTER — Other Ambulatory Visit: Payer: Self-pay

## 2017-08-18 ENCOUNTER — Ambulatory Visit (INDEPENDENT_AMBULATORY_CARE_PROVIDER_SITE_OTHER): Payer: Medicare Other

## 2017-08-18 VITALS — BP 120/62 | HR 85 | Resp 16

## 2017-08-18 DIAGNOSIS — I482 Chronic atrial fibrillation, unspecified: Secondary | ICD-10-CM

## 2017-08-18 DIAGNOSIS — I4819 Other persistent atrial fibrillation: Secondary | ICD-10-CM

## 2017-08-18 MED ORDER — AMIODARONE HCL 200 MG PO TABS: 200.0000 mg | ORAL_TABLET | Freq: Two times a day (BID) | ORAL | 6 refills | Status: DC

## 2017-08-18 NOTE — Telephone Encounter (Signed)
Pt called today concerned for elevated HR 97-107 since yesterday.  SBP 130s-140s. August 10 DCCV, taking amiodarone 200mg  qd. She will come to the office this morning for an EKG/nurse visit.

## 2017-08-18 NOTE — Patient Instructions (Signed)
1.) Reason for visit: EKG  2.) Name of MD requesting visit:   3.) H&P: Pt has hx of afib. August 10 DCCV. Amiodarone increased to 200mg  qd.  4.) ROS related to problem: Pt called today to report elevated HR 97-103 at home. BP 130s-140s. She is slightly SOB.    5.) Assessment and plan per MD: EKG today shows atrial fibrillation, HR 85. Reviewed by Christell Faith, PA-C who advised pt to increase amiodarone to 200mg  BID. F/u w/Dr. Fletcher Anon to discuss possible cardiac ablation. Pt agreeable.

## 2017-08-23 ENCOUNTER — Ambulatory Visit (INDEPENDENT_AMBULATORY_CARE_PROVIDER_SITE_OTHER): Payer: Medicare Other | Admitting: Cardiovascular Disease

## 2017-08-23 ENCOUNTER — Encounter: Payer: Self-pay | Admitting: Cardiovascular Disease

## 2017-08-23 VITALS — BP 120/70 | HR 104 | Ht 64.5 in | Wt 158.0 lb

## 2017-08-23 DIAGNOSIS — I10 Essential (primary) hypertension: Secondary | ICD-10-CM

## 2017-08-23 DIAGNOSIS — I4819 Other persistent atrial fibrillation: Secondary | ICD-10-CM

## 2017-08-23 DIAGNOSIS — I779 Disorder of arteries and arterioles, unspecified: Secondary | ICD-10-CM

## 2017-08-23 DIAGNOSIS — I739 Peripheral vascular disease, unspecified: Secondary | ICD-10-CM

## 2017-08-23 DIAGNOSIS — I481 Persistent atrial fibrillation: Secondary | ICD-10-CM

## 2017-08-23 MED ORDER — METOPROLOL TARTRATE 25 MG PO TABS: 25.0000 mg | ORAL_TABLET | Freq: Two times a day (BID) | ORAL | 3 refills | Status: DC

## 2017-08-23 MED ORDER — AMIODARONE HCL 200 MG PO TABS
200.0000 mg | ORAL_TABLET | Freq: Every day | ORAL | 6 refills | Status: DC
Start: 2017-08-23 — End: 2017-10-19

## 2017-08-23 NOTE — Patient Instructions (Addendum)
Medication Instructions:  Your physician has recommended you make the following change in your medication:  DECREASE amiodarone to 200mg  once daily STOP taking amlodipine START taking metoprolol 25mg  twice dayly   Labwork: none  Testing/Procedures: Your physician has requested that you have an echocardiogram. Echocardiography is a painless test that uses sound waves to create images of your heart. It provides your doctor with information about the size and shape of your heart and how well your heart's chambers and valves are working. This procedure takes approximately one hour. There are no restrictions for this procedure.    Follow-Up: Your physician recommends that you schedule a follow-up appointment in: 4 months with Dr. Fletcher Anon as was recommended at August appt.     Any Other Special Instructions Will Be Listed Below (If Applicable). You have been referred to Dr. Thompson Grayer, EP, for cardiac ablation.   [name] [name]      If you need a refill on your cardiac medications before your next appointment, please call your pharmacy.  Echocardiogram An echocardiogram, or echocardiography, uses sound waves (ultrasound) to produce an image of your heart. The echocardiogram is simple, painless, obtained within a short period of time, and offers valuable information to your health care provider. The images from an echocardiogram can provide information such as:  Evidence of coronary artery disease (CAD).  Heart size.  Heart muscle function.  Heart valve function.  Aneurysm detection.  Evidence of a past heart attack.  Fluid buildup around the heart.  Heart muscle thickening.  Assess heart valve function.  Tell a health care provider about:  Any allergies you have.  All medicines you are taking, including vitamins, herbs, eye drops, creams, and over-the-counter medicines.  Any problems you or family members have had  with anesthetic medicines.  Any blood disorders you have.  Any surgeries you have had.  Any medical conditions you have.  Whether you are pregnant or may be pregnant. What happens before the procedure? No special preparation is needed. Eat and drink normally. What happens during the procedure?  In order to produce an image of your heart, gel will be applied to your chest and a wand-like tool (transducer) will be moved over your chest. The gel will help transmit the sound waves from the transducer. The sound waves will harmlessly bounce off your heart to allow the heart images to be captured in real-time motion. These images will then be recorded.  You may need an IV to receive a medicine that improves the quality of the pictures. What happens after the procedure? You may return to your normal schedule including diet, activities, and medicines, unless your health care provider tells you otherwise. This information is not intended to replace advice given to you by your health care provider. Make sure you discuss any questions you have with your health care provider. Document Released: 12/03/2000 Document Revised: 07/24/2016 Document Reviewed: 08/13/2013 Elsevier Interactive Patient Education  2017 [employer].  Cardiac Ablation Cardiac ablation is a procedure to stop some heart tissue from causing problems. The heart has many electrical connections. Sometimes these connections make the heart beat very fast or irregularly. Removing some problem areas can improve the heart rhythm or make it normal. What happens before the procedure?  Follow instructions from your doctor about what you cannot eat or drink.  Ask your doctor about: ? Changing or stopping your normal medicines. This is important if you take diabetes medicines or blood  thinners. ? Taking medicines such as aspirin and ibuprofen. These medicines can thin your blood. Do not take these medicines before your procedure if your  doctor tells you not to.  Plan to have someone take you home.  If you will be going home right after the procedure, plan to have someone with you for 24 hours. What happens during the procedure?  To lower your risk of infection: ? Your health care team will wash or sanitize their hands. ? Your skin will be washed with soap. ? Hair may be removed from your neck or groin.  An IV tube will be put into one of your veins.  You will be given a medicine to help you relax (sedative).  Skin on your neck or groin will be numbed.  A cut (incision) will be made in your neck or groin.  A needle will be put through your cut and into a vein in your neck or groin.  A tube (catheter) will be put into the needle. The tube will be moved to your heart. X-rays (fluoroscopy) will be used to help guide the tube.  Small devices (electrodes) on the tip of the tube will send out electrical currents.  Dye may be put through the tube. This helps your surgeon see your heart.  Electrical energy will be used to scar (ablate) some heart tissue. Your surgeon may use: ? Heat (radiofrequency energy). ? Laser energy. ? Extreme cold (cryoablation).  The tube will be taken out.  Pressure will be held on your cut. This helps stop bleeding.  A bandage (dressing) will be put on your cut. The procedure may vary. What happens after the procedure?  You will be monitored until your medicines have worn off.  Your cut will be watched for bleeding. You will need to lie still for a few hours.  Do not drive for 24 hours or as long as your doctor tells you. Summary  Cardiac ablation is a procedure to stop some heart tissue from causing problems.  Electrical energy will be used to scar (ablate) some heart tissue. This information is not intended to replace advice given to you by your health care provider. Make sure you discuss any questions you have with your health care provider. Document Released: 08/08/2013  Document Revised: 10/25/2016 Document Reviewed: 10/25/2016 Elsevier Interactive Patient Education  2017 [employer].

## 2017-08-23 NOTE — Progress Notes (Signed)
Cardiology Office Note   Date:  08/23/2017   ID:  [name] [name], DOB 23-Oct-1936, MRN [id number]  PCP:  [name], MD  Cardiologist:   [name], MD   Chief Complaint  Patient presents with  . other    Follow up s/p cardioversion. Pt. c/o being back in A-Fib with shortness of breath and rapid heart beats.       History of Present Illness: [name] [name] is a 81 y.o. female who presents for a followup visit regarding persistent atrial fibrillation.   She has known history of persistent atrial fibrillation/flutter status post multiple cardioversions in the past (total of 7). 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 She had cardiac catheterization twice in the past without significant coronary artery disease.  Most recent cardioversion was in Feb of 2018.  Most recent Echocardiogram in November 2016 showed normal LV systolic function with mildly dilated left atrium, mild mitral regurgitation, and mild to moderate pulmonary hypertension.  She had intermittent Mobitz 2 AV block which resolved after decreasing the dose of amiodarone to 100 mg once daily.  She is on long-term anticoagulation with Eliquis.   She had cardioversion done in February 2018 and most recently last month. Unfortunately, she called our office last week with palpitations and was noted to be back in atrial fibrillation. The dose of amiodarone was increased to 200 mg twice daily but she continues to have palpitations and fatigue. She reports mild dyspnea without chest pain.  Past Medical History:  Diagnosis Date  . Arthritis   . Atrial flutter ([place]) 02/2011   s/p cardioversion   . Cancer (HCC)    BREAST  . Chest pain    a. H/o cardiac cath x 2, last 2012 -->nl cors;  b. 12/2016 MV: EF 61%, small region of mild perfusion defect in the apical anteroseptal region c/w breast attenuation, no ischemia-->Low risk.  . Chronic kidney disease    acute renal failure secondary  to dehydration which is now resolved  . Cystocele   . Diastolic dysfunction    a. 10/2015 Echo: EF 60-65%, no rwma, Gr1 DD, mild MR, mildly dil LA, nl RV fxn, PASP 45mmHg.  [name] GERD (gastroesophageal reflux disease)   . Headache(784.0)    chronic  . Hyperlipidemia   . Hypertension   . Knee fracture   . Light headedness    due to dehydration  . Mobitz type 2 second degree atrioventricular block    a. felt to be 2/2 amiodarone, resolved with decreased amiodarone dose  . Persistent atrial fibrillation ([place])    a. status post multiple DCCV, most recently in 07/2013  . PONV (postoperative nausea and vomiting)    oxycodone and codiene cause N/V   . Pre-syncope   . 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 Venous insufficiency   . Vertigo     Past Surgical History:  Procedure Laterality Date  . ABDOMINAL HYSTERECTOMY  1990  . APPENDECTOMY    . AUGMENTATION MAMMAPLASTY Bilateral 1986   implants  . AUGMENTATION MAMMAPLASTY  1990  . AUGMENTATION MAMMAPLASTY  2011  . CARDIAC CATHETERIZATION    . CARDIOVERSION     x 3  . CARDIOVERSION    . CARDIOVERSION N/A 02/07/2017   Procedure: CARDIOVERSION;  Surgeon: Wellington Hampshire, MD;  Location: [facility];  Service: Cardiovascular;  Laterality: N/A;  . CARDIOVERSION N/A 07/22/2017   Procedure: Cardioversion;  Surgeon: Minna Merritts, MD;  Location: [facility];  Service: Cardiovascular;  Laterality: N/A;  . CATARACT EXTRACTION W/PHACO Right 09/21/2016   Procedure: CATARACT EXTRACTION PHACO AND INTRAOCULAR LENS PLACEMENT (IOC);  Surgeon: Birder Robson, MD;  Location: [facility];  Service: Ophthalmology;  Laterality: Right;  [place] 44.1 AP% 16.5 CDE 7.30 Fluid Pack Lot #[id number]  . CATARACT EXTRACTION W/PHACO Left 10/19/2016   Procedure: CATARACT EXTRACTION PHACO AND INTRAOCULAR LENS PLACEMENT (IOC);  Surgeon: Birder Robson, MD;  Location: [facility];  Service: Ophthalmology;  Laterality: Left;  [place] 53.7 AP% 19.5 CDE 10.45 Fluid pack lot #  [id number]  . CHOLECYSTECTOMY    . COMBINED AUGMENTATION MAMMAPLASTY AND ABDOMINOPLASTY    . JOINT REPLACEMENT Left 06/04/2013   left knee  . KNEE ARTHROSCOPY Right 08/16/2016   Procedure: ARTHROSCOPY KNEE, tear posterior horn medial meniscus, tear anterior and posterior horns of lateral meniscus, chondromalacia of lateral compartment grade 3 patella and grade 4 medial;  Surgeon: Dereck Leep, MD;  Location: [facility];  Service: Orthopedics;  Laterality: Right;  . MASTECTOMY  1986   Bilateral with silicone  breast implants, s/p saline replacements  . Multiple orthopedic procedures    . NOSE SURGERY    . TOTAL KNEE ARTHROPLASTY Left      Current Outpatient Prescriptions  Medication Sig Dispense Refill  . acetaminophen (TYLENOL) 500 MG tablet Take 1,000 mg by mouth every 6 (six) hours as needed (pain).     [name] aluminum-magnesium hydroxide 200-200 MG/5ML suspension Take 20 mLs by mouth every 6 (six) hours as needed for indigestion.     [name] amiodarone (PACERONE) 200 MG tablet Take 1 tablet (200 mg total) by mouth 2 (two) times daily. 60 tablet 6  . amLODipine (NORVASC) 2.5 MG tablet Take 1 tablet in the morning and 2 tablets in the evening 270 tablet 1  . apixaban (ELIQUIS) 5 MG TABS tablet Take 1 tablet (5 mg total) by mouth 2 (two) times daily. 60 tablet 6  . cholecalciferol (VITAMIN D) 1000 units tablet Take 1,000 Units by mouth daily.    . Coenzyme Q10 100 MG capsule Take by mouth.    . fluticasone (FLONASE) 50 MCG/ACT nasal spray Place 2 sprays into both nostrils daily. 16 g 0  . furosemide (LASIX) 20 MG tablet Take 1 tablet (20 mg total) by mouth daily. As needed for edema not more than every other day 45 tablet 1  . pantoprazole (PROTONIX) 40 MG tablet Take 1 tablet (40 mg total) by mouth daily. 30 tablet 11  . pravastatin (PRAVACHOL) 20 MG tablet TAKE ONE (1) TABLET BY MOUTH EVERY DAY 90 tablet 3  . Probiotic Product (PROBIOTIC ADVANCED PO) Take 1 tablet by mouth daily.    . sodium  chloride (OCEAN) 0.65 % SOLN nasal spray Place 1 spray into both nostrils 2 (two) times daily as needed for congestion.      No current facility-administered medications for this visit.     Allergies:   Iodine; Biaxin [clarithromycin]; Codeine; Enalapril; Fluocinonide; Oxycodone; Promethazine; Warfarin and related; and Xarelto [rivaroxaban]    Social History:  The patient  reports that she has never smoked. She has never used smokeless tobacco. She reports that she does not drink alcohol or use drugs.   Family History:  The patient's family history includes Breast cancer in her maternal grandmother, sister, sister, and sister; Cancer in her father, maternal grandmother, sister, sister, and sister; Diabetes in her brother; Heart disease in her mother and son.    ROS:  Please see the history of present illness.  Otherwise, review of systems are positive for none.   All other systems are reviewed and negative.    PHYSICAL EXAM: VS:  BP 120/70 (BP Location: Left Arm, Patient Position: Sitting, Cuff Size: Normal)   Pulse (!) 104   Ht 5' 4.5" (1.638 m)   Wt 158 lb (71.7 kg)   BMI 26.70 kg/m  , BMI Body mass index is 26.7 kg/m. GEN: Well nourished, well developed, in no acute distress  HEENT: normal  Neck: no JVD, carotid bruits, or masses Cardiac: Irregularly irregular; no murmurs, rubs, or gallops,no edema  Respiratory:  clear to auscultation bilaterally, normal work of breathing GI: soft, nontender, nondistended, + BS MS: no deformity or atrophy  Skin: warm and dry, no rash Neuro:  Strength and sensation are intact Psych: euthymic mood, full affect   EKG:  EKG is ordered today. The ekg ordered today demonstrates  atrial fibrillation with ventricular rate of 105 bpm with right bundle branch block. Inferolateral T wave changes suggestive of ischemia.  Recent Labs: 07/18/2017: Magnesium 2.5 07/21/2017: ALT 16; TSH 3.500 07/22/2017: BUN 16; Creatinine, Ser 0.93; Hemoglobin 12.6;  Platelets 215; Potassium 3.8; Sodium 138    Lipid Panel    Component Value Date/Time   CHOL 147 07/22/2017 1332   TRIG 51 07/22/2017 1332   HDL 63 07/22/2017 1332   CHOLHDL 2.3 07/22/2017 1332   VLDL 10 07/22/2017 1332   LDLCALC 74 07/22/2017 1332      Wt Readings from Last 3 Encounters:  08/23/17 158 lb (71.7 kg)  08/04/17 158 lb (71.7 kg)  08/01/17 159 lb (72.1 kg)       ASSESSMENT AND PLAN:  1.  Persistent atrial fibrillation:  Unfortunately, she is back in atrial fibrillation in spite of treatment with amiodarone. She had 2 cardioversions done this year and most recent one was only last month. It appears that she is failing treatment with amiodarone. I discussed different options with her including rate control versus maintaining sinus rhythm with ablation.  I elected to add metoprolol 25 mg twice daily and decrease amiodarone back to 200 mg once daily. Continue anticoagulation with Eliquis. I requested a follow-up echocardiogram. I'm going to refer her to Dr. Rayann Heman to discuss A. fib ablation. If symptoms improve with metoprolol and rate control , we can consider rate control. However, based on the patient's previous history, she has always been very symptomatic during atrial fibrillation.  2. Essential hypertension: I discontinued amlodipine in order to add metoprolol.  3. History of tachycardia-induced cardiomyopathy. Most recent ejection fraction was normal .  4. Left carotid calcifications: Recent carotid Doppler showed only mild nonobstructive disease.  Disposition:   FU with me in 3 months  Signed,  [name], MD  08/23/2017 2:07 PM    [place]

## 2017-08-30 ENCOUNTER — Ambulatory Visit (INDEPENDENT_AMBULATORY_CARE_PROVIDER_SITE_OTHER): Payer: Medicare Other

## 2017-08-30 ENCOUNTER — Other Ambulatory Visit: Payer: Self-pay

## 2017-08-30 DIAGNOSIS — I481 Persistent atrial fibrillation: Secondary | ICD-10-CM | POA: Diagnosis not present

## 2017-08-30 DIAGNOSIS — I4819 Other persistent atrial fibrillation: Secondary | ICD-10-CM

## 2017-09-05 ENCOUNTER — Other Ambulatory Visit: Payer: Self-pay | Admitting: Internal Medicine

## 2017-09-05 DIAGNOSIS — Z1231 Encounter for screening mammogram for malignant neoplasm of breast: Secondary | ICD-10-CM

## 2017-09-12 ENCOUNTER — Encounter: Payer: Self-pay | Admitting: Internal Medicine

## 2017-09-12 ENCOUNTER — Ambulatory Visit (INDEPENDENT_AMBULATORY_CARE_PROVIDER_SITE_OTHER): Payer: Medicare Other | Admitting: Internal Medicine

## 2017-09-12 ENCOUNTER — Encounter (INDEPENDENT_AMBULATORY_CARE_PROVIDER_SITE_OTHER): Payer: Self-pay

## 2017-09-12 VITALS — BP 124/70 | HR 76 | Ht 64.0 in | Wt 157.0 lb

## 2017-09-12 DIAGNOSIS — I495 Sick sinus syndrome: Secondary | ICD-10-CM

## 2017-09-12 DIAGNOSIS — I481 Persistent atrial fibrillation: Secondary | ICD-10-CM

## 2017-09-12 DIAGNOSIS — I4819 Other persistent atrial fibrillation: Secondary | ICD-10-CM

## 2017-09-12 LAB — CBC WITH DIFFERENTIAL/PLATELET
Basophils Absolute: 0 10*3/uL (ref 0.0–0.2)
Basos: 0 %
EOS (ABSOLUTE): 0.1 10*3/uL (ref 0.0–0.4)
Eos: 1 %
Hematocrit: 40.1 % (ref 34.0–46.6)
Hemoglobin: 12.7 g/dL (ref 11.1–15.9)
Immature Grans (Abs): 0 10*3/uL (ref 0.0–0.1)
Immature Granulocytes: 0 %
Lymphocytes Absolute: 1 10*3/uL (ref 0.7–3.1)
Lymphs: 13 %
MCH: 30.5 pg (ref 26.6–33.0)
MCHC: 31.7 g/dL (ref 31.5–35.7)
MCV: 96 fL (ref 79–97)
Monocytes Absolute: 0.4 10*3/uL (ref 0.1–0.9)
Monocytes: 5 %
Neutrophils Absolute: 5.8 10*3/uL (ref 1.4–7.0)
Neutrophils: 81 %
Platelets: 270 10*3/uL (ref 150–379)
RBC: 4.16 x10E6/uL (ref 3.77–5.28)
RDW: 14.1 % (ref 12.3–15.4)
WBC: 7.2 10*3/uL (ref 3.4–10.8)

## 2017-09-12 LAB — BASIC METABOLIC PANEL
BUN/Creatinine Ratio: 18 (ref 12–28)
BUN: 18 mg/dL (ref 8–27)
CO2: 24 mmol/L (ref 20–29)
Calcium: 9.4 mg/dL (ref 8.7–10.3)
Chloride: 102 mmol/L (ref 96–106)
Creatinine, Ser: 1.02 mg/dL — ABNORMAL HIGH (ref 0.57–1.00)
GFR calc Af Amer: 60 mL/min/{1.73_m2} (ref >59)
GFR calc non Af Amer: 52 mL/min/{1.73_m2} — ABNORMAL LOW (ref >59)
Glucose: 100 mg/dL — ABNORMAL HIGH (ref 65–99)
Potassium: 4.6 mmol/L (ref 3.5–5.2)
Sodium: 140 mmol/L (ref 134–144)

## 2017-09-12 NOTE — Progress Notes (Signed)
Electrophysiology Office Note   Date:  09/12/2017   ID:  [name] [name], DOB 1936/05/06, MRN [id number]  PCP:  [name], MD  Cardiologist:  Dr Fletcher Anon Primary Electrophysiologist: Thompson Grayer, MD    Chief Complaint  Patient presents with  . Persistant Atrial Fibrillation     History of Present Illness: [name] [name] is a 81 y.o. female who presents today for electrophysiology evaluation.   The patient is referred for EP consultation regarding afib.  She has had persistent afib since at least 2014.  She reports symptoms of tachypalpitations and fatigue.  She has had 7 cardioversions and has failed medical therapy with amiodarone.  She also has sinus bradycardia which has limited medical therapy.  She has declined pacing previously.  She has a sister with AF and states that her sister has a pacemaker and doesn't feel any better. She had bleeding with coumadin but is tolerating eliquis well at this time.  Today, she denies symptoms of palpitations, chest pain, shortness of breath, orthopnea, PND, lower extremity edema, claudication, dizziness, presyncope, syncope, bleeding, or neurologic sequela. The patient is tolerating medications without difficulties and is otherwise without complaint today.    Past Medical History:  Diagnosis Date  . Arthritis   . Atrial flutter ([place]) 02/2011   s/p cardioversion   . Cancer (HCC)    BREAST  . Chest pain    a. H/o cardiac cath x 2, last 2012 -->nl cors;  b. 12/2016 MV: EF 61%, small region of mild perfusion defect in the apical anteroseptal region c/w breast attenuation, no ischemia-->Low risk.  . Chronic kidney disease    acute renal failure secondary to dehydration which is now resolved  . Cystocele   . Diastolic dysfunction    a. 10/2015 Echo: EF 60-65%, no rwma, Gr1 DD, mild MR, mildly dil LA, nl RV fxn, PASP 84mmHg.  [name] GERD (gastroesophageal reflux disease)   . Headache(784.0)    chronic  . Hyperlipidemia   . Hypertension   .  Knee fracture   . Light headedness    due to dehydration  . Mobitz type 2 second degree atrioventricular block    a. felt to be 2/2 amiodarone, resolved with decreased amiodarone dose  . Persistent atrial fibrillation ([place])    a. status post multiple DCCV, most recently in 07/2013  . PONV (postoperative nausea and vomiting)    oxycodone and codiene cause N/V   . Pre-syncope   . 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 Venous insufficiency   . Vertigo    Past Surgical History:  Procedure Laterality Date  . ABDOMINAL HYSTERECTOMY  1990  . APPENDECTOMY    . AUGMENTATION MAMMAPLASTY Bilateral 1986   implants  . AUGMENTATION MAMMAPLASTY  1990  . AUGMENTATION MAMMAPLASTY  2011  . CARDIAC CATHETERIZATION    . CARDIOVERSION     x 3  . CARDIOVERSION    . CARDIOVERSION N/A 02/07/2017   Procedure: CARDIOVERSION;  Surgeon: Wellington Hampshire, MD;  Location: [facility];  Service: Cardiovascular;  Laterality: N/A;  . CARDIOVERSION N/A 07/22/2017   Procedure: Cardioversion;  Surgeon: Minna Merritts, MD;  Location: [facility];  Service: Cardiovascular;  Laterality: N/A;  . CATARACT EXTRACTION W/PHACO Right 09/21/2016   Procedure: CATARACT EXTRACTION PHACO AND INTRAOCULAR LENS PLACEMENT ([place]);  Surgeon: Birder Robson, MD;  Location: [facility];  Service: Ophthalmology;  Laterality: Right;  [place] 44.1 AP% 16.5 CDE 7.30 Fluid Pack Lot #[id number]  . CATARACT EXTRACTION W/PHACO Left 10/19/2016  Procedure: CATARACT EXTRACTION PHACO AND INTRAOCULAR LENS PLACEMENT (IOC);  Surgeon: Birder Robson, MD;  Location: [facility];  Service: Ophthalmology;  Laterality: Left;  [place] 53.7 AP% 19.5 CDE 10.45 Fluid pack lot # [id number]  . CHOLECYSTECTOMY    . COMBINED AUGMENTATION MAMMAPLASTY AND ABDOMINOPLASTY    . JOINT REPLACEMENT Left 06/04/2013   left knee  . KNEE ARTHROSCOPY Right 08/16/2016   Procedure: ARTHROSCOPY KNEE, tear posterior horn medial meniscus, tear anterior and posterior horns of lateral meniscus,  chondromalacia of lateral compartment grade 3 patella and grade 4 medial;  Surgeon: Dereck Leep, MD;  Location: [facility];  Service: Orthopedics;  Laterality: Right;  . MASTECTOMY  1986   Bilateral with silicone  breast implants, s/p saline replacements  . Multiple orthopedic procedures    . NOSE SURGERY    . TOTAL KNEE ARTHROPLASTY Left      Current Outpatient Prescriptions  Medication Sig Dispense Refill  . acetaminophen (TYLENOL) 500 MG tablet Take 1,000 mg by mouth every 6 (six) hours as needed (pain).     [name] aluminum-magnesium hydroxide 200-200 MG/5ML suspension Take 20 mLs by mouth every 6 (six) hours as needed for indigestion.     [name] amiodarone (PACERONE) 200 MG tablet Take 1 tablet (200 mg total) by mouth daily. 30 tablet 6  . apixaban (ELIQUIS) 5 MG TABS tablet Take 1 tablet (5 mg total) by mouth 2 (two) times daily. 60 tablet 6  . cholecalciferol (VITAMIN D) 1000 units tablet Take 1,000 Units by mouth daily.    . Coenzyme Q10 100 MG capsule Take by mouth.    . fluticasone (FLONASE) 50 MCG/ACT nasal spray Place 2 sprays into both nostrils daily. 16 g 0  . furosemide (LASIX) 20 MG tablet Take 1 tablet (20 mg total) by mouth daily. As needed for edema not more than every other day 45 tablet 1  . metoprolol tartrate (LOPRESSOR) 25 MG tablet Take 0.5 tablet (12.5 mg) by mouth in the AM and take 0.5 tablet (12.5 mg) by mouth in the PM as needed for increased heart rate    . pantoprazole (PROTONIX) 40 MG tablet Take 1 tablet (40 mg total) by mouth daily. 30 tablet 11  . pravastatin (PRAVACHOL) 20 MG tablet TAKE ONE (1) TABLET BY MOUTH EVERY DAY 90 tablet 3  . Probiotic Product (PROBIOTIC ADVANCED PO) Take 1 tablet by mouth daily.    . sodium chloride (OCEAN) 0.65 % SOLN nasal spray Place 1 spray into both nostrils 2 (two) times daily as needed for congestion.      No current facility-administered medications for this visit.     Allergies:   Iodine; Biaxin [clarithromycin]; Codeine;  Enalapril; Fluocinonide; Oxycodone; Promethazine; Warfarin and related; and Xarelto [rivaroxaban]   Social History:  The patient  reports that she has never smoked. She has never used smokeless tobacco. She reports that she does not drink alcohol or use drugs.   Family History:  The patient's  family history includes Breast cancer in her maternal grandmother, sister, sister, and sister; Cancer in her father, maternal grandmother, sister, sister, and sister; Diabetes in her brother; Heart disease in her mother and son.    ROS:  Please see the history of present illness.   All other systems are personally reviewed and negative.    PHYSICAL EXAM: VS:  BP 124/70   Pulse 76   Ht 5\' 4"  (1.626 m)   Wt 157 lb (71.2 kg)   SpO2 97%   BMI 26.95 kg/m  ,  BMI Body mass index is 26.95 kg/m. GEN: Well nourished, well developed, in no acute distress  HEENT: normal  Neck: no JVD, carotid bruits, or masses Cardiac: iRRR; no murmurs, rubs, or gallops,no edema  Respiratory:  clear to auscultation bilaterally, normal work of breathing GI: soft, nontender, nondistended, + BS MS: no deformity or atrophy  Skin: warm and dry  Neuro:  Strength and sensation are intact Psych: euthymic mood, full affect  EKG:  EKG is ordered today. The ekg ordered today is personally reviewed and shows afib, V rate 76 bpm, RBBB, diffuse TWI   Recent Labs: 07/18/2017: Magnesium 2.5 07/21/2017: ALT 16; TSH 3.500 07/22/2017: BUN 16; Creatinine, Ser 0.93; Hemoglobin 12.6; Platelets 215; Potassium 3.8; Sodium 138  personally reviewed   Lipid Panel     Component Value Date/Time   CHOL 147 07/22/2017 1332   TRIG 51 07/22/2017 1332   HDL 63 07/22/2017 1332   CHOLHDL 2.3 07/22/2017 1332   VLDL 10 07/22/2017 1332   LDLCALC 74 07/22/2017 1332   personally reviewed   Wt Readings from Last 3 Encounters:  09/12/17 157 lb (71.2 kg)  08/23/17 158 lb (71.7 kg)  08/04/17 158 lb (71.7 kg)      Other studies personally  reviewed: Additional studies/ records that were reviewed today include: Dr Tyrell Antonio notes , prior echo  Review of the above records today demonstrates: as above   ASSESSMENT AND PLAN:  1.  Persistent afib She has failed medical therapy with amiodarone.  Therapies have been limited by sinus bradycardia also.  Her LA is quite enlarged at 50 mm. Therapeutic strategies for afib including rate and rhythm control were discussed in detail with the patient today. We discussed PPM with AV nodal ablation as an option which she declines.  Risk, benefits, and alternatives to EP study and radiofrequency ablation for afib were also discussed in detail today.  I have informed her that I think that her anticipated success rates with ablation are 50-60% at best and will ikely require repeat procedures.  Risks including but are not limited to stroke, bleeding, vascular damage, tamponade, perforation, damage to the esophagus, lungs, and other structures, pulmonary vein stenosis, AV block or bradycardia requiring PPM worsening renal function, and death. The patient understands these risk and wishes to proceed.  We will therefore proceed with catheter ablation at the next available time.  Will plan TEE prior to ablation  2. Sinus bradycardia I worry that she will eventually require pacing Hold amiodarone and metoprolol for 48 hours prior to ablation   Current medicines are reviewed at length with the patient today.   The patient does not have concerns regarding her medicines.  The following changes were made today:  none    Signed, Thompson Grayer, MD  09/12/2017 10:50 AM     [facility] [address] [place]  [address] [phone] (office) [phone] (fax)

## 2017-09-12 NOTE — Patient Instructions (Addendum)
Medication Instructions:  Your physician recommends that you continue on your current medications as directed. Please refer to the Current Medication list given to you today.   Labwork: Your physician recommends that you return for lab work today: BMP/CBC   Testing/Procedures:   Your physician has requested that you have a TEE. During a TEE, sound waves are used to create images of your heart. It provides your doctor with information about the size and shape of your heart and how well your heart's chambers and valves are working. In this test, a transducer is attached to the end of a flexible tube that's guided down your throat and into your esophagus (the tube leading from you mouth to your stomach) to get a more detailed image of your heart. You are not awake for the procedure. Please see the instruction sheet given to you today. For further information please visit [email or web address]  Please arrive at [facility] at 7:30am Do not eat or drink after midnight the night prior to the procedure Okay to take morning medications with sip of water Will HOLD Metoprolol and Amiodarone starting 09/25/17.  Last dose of 09/24/17 Will need someone to drive you home at discharge     Your physician has recommended that you have an ablation. Catheter ablation is a medical procedure used to treat some cardiac arrhythmias (irregular heartbeats). During catheter ablation, a long, thin, flexible tube is put into a blood vessel in your groin (upper thigh), or neck. This tube is called an ablation catheter. It is then guided to your heart through the blood vessel. Radio frequency waves destroy small areas of heart tissue where abnormal heartbeats may cause an arrhythmia to start. Please see the instruction sheet given to you today.---09/27/17   Please arrive at [facility] at 5:30am Do not eat or drink after midnight  the night prior to the procedure Do not take any medications the morning of the test Will HOLD Metoprolol and Amiodarone starting 09/25/17.  Last dose of 09/24/17 Plan for one night stay Will need someone to drive you home at discharge     Follow-Up:  Your physician recommends that you schedule a follow-up appointment in: 4 weeks from 09/27/17 with afib clinic and 3 months from 09/27/17 with Dr Rayann Heman   Thank you for choosing [facility]!!     Janan Halter, RN [phone]

## 2017-09-14 ENCOUNTER — Telehealth: Payer: Self-pay

## 2017-09-14 NOTE — Telephone Encounter (Signed)
Pt would like Eliquis samples.  

## 2017-09-14 NOTE — Telephone Encounter (Signed)
Order

## 2017-09-14 NOTE — Telephone Encounter (Signed)
Samples placed up front.   4 boxes of Eliquis 5MG  LOT# [id number] EXP: 11/2019

## 2017-09-19 ENCOUNTER — Ambulatory Visit
Admission: RE | Admit: 2017-09-19 | Discharge: 2017-09-19 | Disposition: A | Discharge status: Home / Self Care | Payer: Medicare Other | Source: Ambulatory Visit | Attending: Internal Medicine | Admitting: Internal Medicine

## 2017-09-19 DIAGNOSIS — Z1231 Encounter for screening mammogram for malignant neoplasm of breast: Secondary | ICD-10-CM | POA: Diagnosis not present

## 2017-09-19 NOTE — Telephone Encounter (Signed)
Letter  

## 2017-09-26 ENCOUNTER — Ambulatory Visit (HOSPITAL_COMMUNITY)
Admission: RE | Admit: 2017-09-26 | Discharge: 2017-09-26 | Disposition: A | Discharge status: Home / Self Care | Payer: Medicare Other | Source: Ambulatory Visit | Attending: Internal Medicine | Admitting: Internal Medicine

## 2017-09-26 ENCOUNTER — Encounter (HOSPITAL_COMMUNITY)
Admission: RE | Disposition: A | Discharge status: Home / Self Care | Payer: Self-pay | Source: Ambulatory Visit | Attending: Internal Medicine

## 2017-09-26 ENCOUNTER — Ambulatory Visit (HOSPITAL_BASED_OUTPATIENT_CLINIC_OR_DEPARTMENT_OTHER)
Admission: RE | Admit: 2017-09-26 | Discharge: 2017-09-26 | Disposition: A | Discharge status: Home / Self Care | Payer: Medicare Other | Source: Ambulatory Visit | Attending: Internal Medicine | Admitting: Internal Medicine

## 2017-09-26 ENCOUNTER — Encounter (HOSPITAL_COMMUNITY): Payer: Self-pay | Admitting: *Deleted

## 2017-09-26 ENCOUNTER — Ambulatory Visit (HOSPITAL_COMMUNITY): Payer: Medicare Other | Admitting: Anesthesiology

## 2017-09-26 DIAGNOSIS — E785 Hyperlipidemia, unspecified: Secondary | ICD-10-CM | POA: Insufficient documentation

## 2017-09-26 DIAGNOSIS — Z8249 Family history of ischemic heart disease and other diseases of the circulatory system: Secondary | ICD-10-CM | POA: Insufficient documentation

## 2017-09-26 DIAGNOSIS — Z9049 Acquired absence of other specified parts of digestive tract: Secondary | ICD-10-CM | POA: Diagnosis not present

## 2017-09-26 DIAGNOSIS — Z803 Family history of malignant neoplasm of breast: Secondary | ICD-10-CM | POA: Insufficient documentation

## 2017-09-26 DIAGNOSIS — I872 Venous insufficiency (chronic) (peripheral): Secondary | ICD-10-CM | POA: Diagnosis not present

## 2017-09-26 DIAGNOSIS — R55 Syncope and collapse: Secondary | ICD-10-CM | POA: Diagnosis not present

## 2017-09-26 DIAGNOSIS — R42 Dizziness and giddiness: Secondary | ICD-10-CM | POA: Diagnosis not present

## 2017-09-26 DIAGNOSIS — R51 Headache: Secondary | ICD-10-CM | POA: Diagnosis not present

## 2017-09-26 DIAGNOSIS — I441 Atrioventricular block, second degree: Secondary | ICD-10-CM | POA: Diagnosis not present

## 2017-09-26 DIAGNOSIS — Z853 Personal history of malignant neoplasm of breast: Secondary | ICD-10-CM | POA: Diagnosis not present

## 2017-09-26 DIAGNOSIS — Z809 Family history of malignant neoplasm, unspecified: Secondary | ICD-10-CM | POA: Insufficient documentation

## 2017-09-26 DIAGNOSIS — Z888 Allergy status to other drugs, medicaments and biological substances status: Secondary | ICD-10-CM | POA: Diagnosis not present

## 2017-09-26 DIAGNOSIS — M1990 Unspecified osteoarthritis, unspecified site: Secondary | ICD-10-CM | POA: Insufficient documentation

## 2017-09-26 DIAGNOSIS — I4891 Unspecified atrial fibrillation: Secondary | ICD-10-CM | POA: Diagnosis not present

## 2017-09-26 DIAGNOSIS — R001 Bradycardia, unspecified: Secondary | ICD-10-CM | POA: Insufficient documentation

## 2017-09-26 DIAGNOSIS — Z833 Family history of diabetes mellitus: Secondary | ICD-10-CM | POA: Insufficient documentation

## 2017-09-26 DIAGNOSIS — I4892 Unspecified atrial flutter: Secondary | ICD-10-CM | POA: Diagnosis not present

## 2017-09-26 DIAGNOSIS — Z90710 Acquired absence of both cervix and uterus: Secondary | ICD-10-CM | POA: Insufficient documentation

## 2017-09-26 DIAGNOSIS — N189 Chronic kidney disease, unspecified: Secondary | ICD-10-CM | POA: Insufficient documentation

## 2017-09-26 DIAGNOSIS — I429 Cardiomyopathy, unspecified: Secondary | ICD-10-CM | POA: Diagnosis not present

## 2017-09-26 DIAGNOSIS — Z9841 Cataract extraction status, right eye: Secondary | ICD-10-CM | POA: Diagnosis not present

## 2017-09-26 DIAGNOSIS — I129 Hypertensive chronic kidney disease with stage 1 through stage 4 chronic kidney disease, or unspecified chronic kidney disease: Secondary | ICD-10-CM | POA: Insufficient documentation

## 2017-09-26 DIAGNOSIS — Z96652 Presence of left artificial knee joint: Secondary | ICD-10-CM | POA: Insufficient documentation

## 2017-09-26 DIAGNOSIS — Z885 Allergy status to narcotic agent status: Secondary | ICD-10-CM | POA: Diagnosis not present

## 2017-09-26 DIAGNOSIS — I481 Persistent atrial fibrillation: Secondary | ICD-10-CM | POA: Insufficient documentation

## 2017-09-26 DIAGNOSIS — Z79899 Other long term (current) drug therapy: Secondary | ICD-10-CM | POA: Diagnosis not present

## 2017-09-26 DIAGNOSIS — Z9842 Cataract extraction status, left eye: Secondary | ICD-10-CM | POA: Insufficient documentation

## 2017-09-26 HISTORY — PX: TEE WITHOUT CARDIOVERSION: SHX5443

## 2017-09-26 SURGERY — ECHOCARDIOGRAM, TRANSESOPHAGEAL
Anesthesia: Moderate Sedation

## 2017-09-26 MED ORDER — MIDAZOLAM HCL 10 MG/2ML IJ SOLN
INTRAMUSCULAR | Status: DC | PRN
  Administered 2017-09-26 (×2): 2 mg via INTRAVENOUS

## 2017-09-26 MED ORDER — SODIUM CHLORIDE 0.9 % IV SOLN: INTRAVENOUS | Status: DC

## 2017-09-26 MED ORDER — LIDOCAINE VISCOUS 2 % MT SOLN
OROMUCOSAL | Status: DC | PRN
  Administered 2017-09-26: 1 via OROMUCOSAL

## 2017-09-26 MED ORDER — FENTANYL CITRATE (PF) 100 MCG/2ML IJ SOLN
INTRAMUSCULAR | Status: AC
  Filled 2017-09-26: qty 2

## 2017-09-26 MED ORDER — MIDAZOLAM HCL 5 MG/ML IJ SOLN
INTRAMUSCULAR | Status: AC
  Filled 2017-09-26: qty 2

## 2017-09-26 MED ORDER — FENTANYL CITRATE (PF) 100 MCG/2ML IJ SOLN
INTRAMUSCULAR | Status: DC | PRN
  Administered 2017-09-26: 25 ug via INTRAVENOUS

## 2017-09-26 MED ORDER — LIDOCAINE VISCOUS 2 % MT SOLN
OROMUCOSAL | Status: AC
  Filled 2017-09-26: qty 15

## 2017-09-26 MED ORDER — SODIUM CHLORIDE 0.9 % IV SOLN
INTRAVENOUS | Status: DC
  Administered 2017-09-26: 500 mL via INTRAVENOUS

## 2017-09-26 NOTE — H&P (View-Only) (Signed)
Electrophysiology Office Note   Date:  09/12/2017   ID:  [name] [name], DOB [birth year], MRN [id number]  PCP:  [name], MD  Cardiologist:  Dr Fletcher Anon Primary Electrophysiologist: Thompson Grayer, MD    Chief Complaint  Patient presents with  . Persistant Atrial Fibrillation     History of Present Illness: [name] [name] is a 81 y.o. female who presents today for electrophysiology evaluation.   The patient is referred for EP consultation regarding afib.  She has had persistent afib since at least 2014.  She reports symptoms of tachypalpitations and fatigue.  She has had 7 cardioversions and has failed medical therapy with amiodarone.  She also has sinus bradycardia which has limited medical therapy.  She has declined pacing previously.  She has a sister with AF and states that her sister has a pacemaker and doesn't feel any better. She had bleeding with coumadin but is tolerating eliquis well at this time.  Today, she denies symptoms of palpitations, chest pain, shortness of breath, orthopnea, PND, lower extremity edema, claudication, dizziness, presyncope, syncope, bleeding, or neurologic sequela. The patient is tolerating medications without difficulties and is otherwise without complaint today.    Past Medical History:  Diagnosis Date  . Arthritis   . Atrial flutter ([place]) 02/2011   s/p cardioversion   . Cancer (HCC)    BREAST  . Chest pain    a. H/o cardiac cath x 2, last 2012 -->nl cors;  b. 12/2016 MV: EF 61%, small region of mild perfusion defect in the apical anteroseptal region c/w breast attenuation, no ischemia-->Low risk.  . Chronic kidney disease    acute renal failure secondary to dehydration which is now resolved  . Cystocele   . Diastolic dysfunction    a. 10/2015 Echo: EF 60-65%, no rwma, Gr1 DD, mild MR, mildly dil LA, nl RV fxn, PASP 41mmHg.  [name] GERD (gastroesophageal reflux disease)   . Headache(784.0)    chronic  . Hyperlipidemia   . Hypertension   .  Knee fracture   . Light headedness    due to dehydration  . Mobitz type 2 second degree atrioventricular block    a. felt to be 2/2 amiodarone, resolved with decreased amiodarone dose  . Persistent atrial fibrillation ([name] [name])    a. status post multiple DCCV, most recently in 07/2013  . PONV (postoperative nausea and vomiting)    oxycodone and codiene cause N/V   . Pre-syncope   . Tachycardia induced cardiomyopathy ([name] [name])   . Venous insufficiency   . Vertigo    Past Surgical History:  Procedure Laterality Date  . ABDOMINAL HYSTERECTOMY  1990  . APPENDECTOMY    . AUGMENTATION MAMMAPLASTY Bilateral 1986   implants  . AUGMENTATION MAMMAPLASTY  1990  . AUGMENTATION MAMMAPLASTY  2011  . CARDIAC CATHETERIZATION    . CARDIOVERSION     x 3  . CARDIOVERSION    . CARDIOVERSION N/A 02/07/2017   Procedure: CARDIOVERSION;  Surgeon: Wellington Hampshire, MD;  Location: [facility] ORS;  Service: Cardiovascular;  Laterality: N/A;  . CARDIOVERSION N/A 07/22/2017   Procedure: Cardioversion;  Surgeon: Minna Merritts, MD;  Location: [facility] OR[facility];  Service: Cardiovascular;  Laterality: N/A;  . CATARACT EXTRACTION W/PHACO Right 09/21/2016   Procedure: CATARACT EXTRACTION PHACO AND INTRAOCULAR LENS PLACEMENT ([place]);  Surgeon: Birder Robson, MD;  Location: [facility];  Service: Ophthalmology;  Laterality: Right;  [place] 44.1 AP% 16.5 CDE 7.30 Fluid Pack Lot #[id number]  . CATARACT EXTRACTION W/PHACO Left 10/19/2016  Procedure: CATARACT EXTRACTION PHACO AND INTRAOCULAR LENS PLACEMENT ([facility]);  Surgeon: Birder Robson, MD;  Location: [facility];  Service: Ophthalmology;  Laterality: Left;  [place] 53.7 AP% 19.5 CDE 10.45 Fluid pack lot # [id number]  . CHOLECYSTECTOMY    . COMBINED AUGMENTATION MAMMAPLASTY AND ABDOMINOPLASTY    . JOINT REPLACEMENT Left 06/04/2013   left knee  . KNEE ARTHROSCOPY Right 08/16/2016   Procedure: ARTHROSCOPY KNEE, tear posterior horn medial meniscus, tear anterior and posterior horns of lateral meniscus,  chondromalacia of lateral compartment grade 3 patella and grade 4 medial;  Surgeon: Dereck Leep, MD;  Location: [facility] OR[facility];  Service: Orthopedics;  Laterality: Right;  . MASTECTOMY  1986   Bilateral with silicone  breast implants, s/p saline replacements  . Multiple orthopedic procedures    . NOSE SURGERY    . TOTAL KNEE ARTHROPLASTY Left      Current Outpatient Prescriptions  Medication Sig Dispense Refill  . acetaminophen (TYLENOL) 500 MG tablet Take 1,000 mg by mouth every 6 (six) hours as needed (pain).     [name] aluminum-magnesium hydroxide 200-200 MG/5ML suspension Take 20 mLs by mouth every 6 (six) hours as needed for indigestion.     [name] amiodarone (PACERONE) 200 MG tablet Take 1 tablet (200 mg total) by mouth daily. 30 tablet 6  . apixaban (ELIQUIS) 5 MG TABS tablet Take 1 tablet (5 mg total) by mouth 2 (two) times daily. 60 tablet 6  . cholecalciferol (VITAMIN D) 1000 units tablet Take 1,000 Units by mouth daily.    . Coenzyme Q10 100 MG capsule Take by mouth.    . fluticasone (FLONASE) 50 MCG/ACT nasal spray Place 2 sprays into both nostrils daily. 16 g 0  . furosemide (LASIX) 20 MG tablet Take 1 tablet (20 mg total) by mouth daily. As needed for edema not more than every other day 45 tablet 1  . metoprolol tartrate (LOPRESSOR) 25 MG tablet Take 0.5 tablet (12.5 mg) by mouth in the AM and take 0.5 tablet (12.5 mg) by mouth in the PM as needed for increased heart rate    . pantoprazole (PROTONIX) 40 MG tablet Take 1 tablet (40 mg total) by mouth daily. 30 tablet 11  . pravastatin (PRAVACHOL) 20 MG tablet TAKE ONE (1) TABLET BY MOUTH EVERY DAY 90 tablet 3  . Probiotic Product (PROBIOTIC ADVANCED PO) Take 1 tablet by mouth daily.    . sodium chloride (OCEAN) 0.65 % SOLN nasal spray Place 1 spray into both nostrils 2 (two) times daily as needed for congestion.      No current facility-administered medications for this visit.     Allergies:   Iodine; Biaxin [clarithromycin]; Codeine;  Enalapril; Fluocinonide; Oxycodone; Promethazine; Warfarin and related; and Xarelto [rivaroxaban]   Social History:  The patient  reports that she has never smoked. She has never used smokeless tobacco. She reports that she does not drink alcohol or use drugs.   Family History:  The patient's  family history includes Breast cancer in her maternal grandmother, sister, sister, and sister; Cancer in her father, maternal grandmother, sister, sister, and sister; Diabetes in her brother; Heart disease in her mother and son.    ROS:  Please see the history of present illness.   All other systems are personally reviewed and negative.    PHYSICAL EXAM: VS:  BP 124/70   Pulse 76   Ht 5\' 4"  (1.626 m)   Wt 157 lb (71.2 kg)   SpO2 97%   BMI 26.95 kg/m  ,  BMI Body mass index is 26.95 kg/m. GEN: Well nourished, well developed, in no acute distress  HEENT: normal  Neck: no JVD, carotid bruits, or masses Cardiac: iRRR; no murmurs, rubs, or gallops,no edema  Respiratory:  clear to auscultation bilaterally, normal work of breathing GI: soft, nontender, nondistended, + BS MS: no deformity or atrophy  Skin: warm and dry  Neuro:  Strength and sensation are intact Psych: euthymic mood, full affect  EKG:  EKG is ordered today. The ekg ordered today is personally reviewed and shows afib, V rate 76 bpm, RBBB, diffuse TWI   Recent Labs: 07/18/2017: Magnesium 2.5 07/21/2017: ALT 16; TSH 3.500 07/22/2017: BUN 16; Creatinine, Ser 0.93; Hemoglobin 12.6; Platelets 215; Potassium 3.8; Sodium 138  personally reviewed   Lipid Panel     Component Value Date/Time   CHOL 147 07/22/2017 1332   TRIG 51 07/22/2017 1332   HDL 63 07/22/2017 1332   CHOLHDL 2.3 07/22/2017 1332   VLDL 10 07/22/2017 1332   LDLCALC 74 07/22/2017 1332   personally reviewed   Wt Readings from Last 3 Encounters:  09/12/17 157 lb (71.2 kg)  08/23/17 158 lb (71.7 kg)  08/04/17 158 lb (71.7 kg)      Other studies personally  reviewed: Additional studies/ records that were reviewed today include: Dr Tyrell Antonio notes , prior echo  Review of the above records today demonstrates: as above   ASSESSMENT AND PLAN:  1.  Persistent afib She has failed medical therapy with amiodarone.  Therapies have been limited by sinus bradycardia also.  Her LA is quite enlarged at 50 mm. Therapeutic strategies for afib including rate and rhythm control were discussed in detail with the patient today. We discussed PPM with AV nodal ablation as an option which she declines.  Risk, benefits, and alternatives to EP study and radiofrequency ablation for afib were also discussed in detail today.  I have informed her that I think that her anticipated success rates with ablation are 50-60% at best and will ikely require repeat procedures.  Risks including but are not limited to stroke, bleeding, vascular damage, tamponade, perforation, damage to the esophagus, lungs, and other structures, pulmonary vein stenosis, AV block or bradycardia requiring PPM worsening renal function, and death. The patient understands these risk and wishes to proceed.  We will therefore proceed with catheter ablation at the next available time.  Will plan TEE prior to ablation  2. Sinus bradycardia I worry that she will eventually require pacing Hold amiodarone and metoprolol for 48 hours prior to ablation   Current medicines are reviewed at length with the patient today.   The patient does not have concerns regarding her medicines.  The following changes were made today:  none    Signed, Thompson Grayer, MD  09/12/2017 10:50 AM     [facility] [address] [place] [address] [phone] (office) [phone] (fax)

## 2017-09-26 NOTE — Progress Notes (Signed)
  Echocardiogram Echocardiogram Transesophageal has been performed.  Johny Chess 09/26/2017, 10:05 AM

## 2017-09-26 NOTE — Interval H&P Note (Signed)
History and Physical Interval Note:  09/26/2017 8:49 AM  [name] [name]  has presented today for surgery, with the diagnosis of afib  The various methods of treatment have been discussed with the patient and family. After consideration of risks, benefits and other options for treatment, the patient has consented to  Procedure(s): TRANSESOPHAGEAL ECHOCARDIOGRAM (TEE) (N/A) as a surgical intervention .  The patient's history has been reviewed, patient examined, no change in status, stable for surgery.  I have reviewed the patient's chart and labs.  Questions were answered to the patient's satisfaction.     Dorris Carnes

## 2017-09-26 NOTE — CV Procedure (Signed)
TEE  LA, LA appendage without masses NO PFO by color doppler or with injection of agitated saline TV normal  MIld TR AV normal  Trace AI PV normal  MV normal LVEF normal   MIld fixed plaquing in the thoracic aorta.

## 2017-09-26 NOTE — Discharge Instructions (Signed)

## 2017-09-27 ENCOUNTER — Encounter (HOSPITAL_COMMUNITY): Payer: Self-pay | Admitting: Anesthesiology

## 2017-09-27 ENCOUNTER — Ambulatory Visit (HOSPITAL_COMMUNITY)
Admission: RE | Admit: 2017-09-27 | Discharge: 2017-09-27 | Disposition: A | Discharge status: Home / Self Care | Payer: Medicare Other | Source: Ambulatory Visit | Attending: Internal Medicine | Admitting: Internal Medicine

## 2017-09-27 ENCOUNTER — Telehealth: Payer: Self-pay | Admitting: Internal Medicine

## 2017-09-27 ENCOUNTER — Encounter (HOSPITAL_COMMUNITY)
Admission: RE | Disposition: A | Discharge status: Home / Self Care | Payer: Self-pay | Source: Ambulatory Visit | Attending: Internal Medicine

## 2017-09-27 DIAGNOSIS — I4891 Unspecified atrial fibrillation: Secondary | ICD-10-CM | POA: Diagnosis not present

## 2017-09-27 DIAGNOSIS — Z539 Procedure and treatment not carried out, unspecified reason: Secondary | ICD-10-CM | POA: Diagnosis not present

## 2017-09-27 SURGERY — ATRIAL FIBRILLATION ABLATION
Anesthesia: Monitor Anesthesia Care

## 2017-09-27 MED ORDER — SODIUM CHLORIDE 0.9 % IV SOLN: INTRAVENOUS | Status: DC

## 2017-09-27 NOTE — Telephone Encounter (Signed)
Copied from hospital progress note:  Unfortunately, the patient hasnt taken her eliquis since Sunday.  She is in afib today.  Given risks of stroke, I have cancelled her ablation.  We will need to bring her back after 3 weeks of uninterrupted eliquis.  I had a long discussion with the patient and her family about importance of compliance with anticoagulation periablation.   She will resume all medicines.  Hold amiodarone and metoprolol for 48 hours prior to her ablation.  Thompson Grayer MD, [facility] 09/27/2017   09/27/17 I spoke with patient, she  is calling to reschedule ablation. Pt advised I will forward to [facility] to follow-up with her about rescheduling ablation.

## 2017-09-27 NOTE — Progress Notes (Signed)
Unfortunately, the patient hasnt taken her eliquis since Sunday.  She is in afib today.  Given risks of stroke, I have cancelled her ablation.  We will need to bring her back after 3 weeks of uninterrupted eliquis.  I had a long discussion with the patient and her family about importance of compliance with anticoagulation periablation.   She will resume all medicines.  Hold amiodarone and metoprolol for 48 hours prior to her ablation.  Thompson Grayer MD, [facility] 09/27/2017 7:15 AM

## 2017-09-27 NOTE — Telephone Encounter (Signed)
New message    Pt is calling asking for a call back. She has some questions about the procedure that she was supposed to have today. Please call.

## 2017-09-27 NOTE — Progress Notes (Signed)
Pt has not taken Eliquis since Sat or Sun.  She can not be sure of last dose but is sure she did not take her dose last night or this am and probably not since Sat. Dr. Rayann Heman notified

## 2017-10-04 NOTE — Telephone Encounter (Signed)
Spoke with patient today to discuss the TEE that she will ned to repeat as her ablation had to be rescheduled due to missing her Eliquis.  She wants to come in and talk to Dr Rayann Heman again prior to doing the case.  I have scheduled her for 10/19/17 at 8:15am.  I will move her case to second case so we can schedule TEE same day if she proceeds.

## 2017-10-05 ENCOUNTER — Encounter: Payer: Self-pay | Admitting: Internal Medicine

## 2017-10-11 ENCOUNTER — Encounter: Payer: Self-pay | Admitting: Family Medicine

## 2017-10-11 ENCOUNTER — Ambulatory Visit (INDEPENDENT_AMBULATORY_CARE_PROVIDER_SITE_OTHER): Payer: Medicare Other | Admitting: Family Medicine

## 2017-10-11 VITALS — BP 146/84 | HR 95 | Temp 97.5°F | Ht 64.0 in | Wt 157.1 lb

## 2017-10-11 DIAGNOSIS — R300 Dysuria: Secondary | ICD-10-CM

## 2017-10-11 LAB — POCT URINALYSIS DIPSTICK
Bilirubin, UA: NEGATIVE
Blood, UA: NEGATIVE
Glucose, UA: NEGATIVE
Ketones, UA: NEGATIVE
Leukocytes, UA: NEGATIVE
Nitrite, UA: NEGATIVE
Protein, UA: NEGATIVE
Spec Grav, UA: 1.01 (ref 1.010–1.025)
Urobilinogen, UA: 0.2 E.U./dL
pH, UA: 7.5 (ref 5.0–8.0)

## 2017-10-11 LAB — COMPREHENSIVE METABOLIC PANEL
ALT: 18 U/L (ref 0–35)
AST: 19 U/L (ref 0–37)
Albumin: 4.2 g/dL (ref 3.5–5.2)
Alkaline Phosphatase: 54 U/L (ref 39–117)
BUN: 18 mg/dL (ref 6–23)
CO2: 31 mEq/L (ref 19–32)
Calcium: 9.7 mg/dL (ref 8.4–10.5)
Chloride: 101 mEq/L (ref 96–112)
Creatinine, Ser: 0.98 mg/dL (ref 0.40–1.20)
GFR: 58.08 mL/min — ABNORMAL LOW (ref >60.00)
Glucose, Bld: 76 mg/dL (ref 70–99)
Potassium: 4.4 mEq/L (ref 3.5–5.1)
Sodium: 137 mEq/L (ref 135–145)
Total Bilirubin: 0.8 mg/dL (ref 0.2–1.2)
Total Protein: 7.2 g/dL (ref 6.0–8.3)

## 2017-10-11 NOTE — Progress Notes (Signed)
SUBJECTIVE: [name] [name] is a 81 y.o. female pt of Dr. Derrel Nip, new to me, who complains of urinary frequency, urgency and dysuria x 7 days, without flank pain, fever, chills, or abnormal vaginal discharge or bleeding.   Current Outpatient Prescriptions on File Prior to Visit  Medication Sig Dispense Refill  . acetaminophen (TYLENOL) 500 MG tablet Take 1,000 mg by mouth every 6 (six) hours as needed (pain).     [name] aluminum-magnesium hydroxide 200-200 MG/5ML suspension Take 20 mLs by mouth every 6 (six) hours as needed for indigestion.     [name] amiodarone (PACERONE) 200 MG tablet Take 1 tablet (200 mg total) by mouth daily. 30 tablet 6  . apixaban (ELIQUIS) 5 MG TABS tablet Take 1 tablet (5 mg total) by mouth 2 (two) times daily. 60 tablet 6  . cholecalciferol (VITAMIN D) 1000 units tablet Take 1,000 Units by mouth daily.    . fluticasone (FLONASE) 50 MCG/ACT nasal spray Place 2 sprays into both nostrils daily. (Patient taking differently: Place 2 sprays into both nostrils daily as needed for allergies or rhinitis. ) 16 g 0  . metoprolol tartrate (LOPRESSOR) 25 MG tablet Take 0.5 tablet (12.5 mg) by mouth in the AM and take 0.5 tablet (12.5 mg) by mouth in the PM as needed for increased heart rate    . pantoprazole (PROTONIX) 40 MG tablet Take 1 tablet (40 mg total) by mouth daily. 30 tablet 11  . pravastatin (PRAVACHOL) 20 MG tablet TAKE ONE (1) TABLET BY MOUTH EVERY DAY 90 tablet 3  . Probiotic Product (PROBIOTIC ADVANCED PO) Take 1 tablet by mouth daily.    . sodium chloride (OCEAN) 0.65 % SOLN nasal spray Place 2 sprays into both nostrils 2 (two) times daily as needed for congestion.      No current facility-administered medications on file prior to visit.     Allergies  Allergen Reactions  . Iodine Anaphylaxis    NO PROBLEMS WITH BETADINE  . Biaxin [Clarithromycin] Nausea And Vomiting  . Codeine Nausea And Vomiting  . Enalapril Other (See Comments)    unknown  . Fluocinonide Other (See  Comments)    Tingling sensation in head and redness to scalp.  . Oxycodone Nausea And Vomiting  . Promethazine Other (See Comments)    Unknown   . Warfarin And Related   . Xarelto [Rivaroxaban] Other (See Comments)    Unknown     Past Medical History:  Diagnosis Date  . Arthritis   . Atrial flutter ([place]) 02/2011   s/p cardioversion   . Cancer (HCC)    BREAST  . Chest pain    a. H/o cardiac cath x 2, last 2012 -->nl cors;  b. 12/2016 MV: EF 61%, small region of mild perfusion defect in the apical anteroseptal region c/w breast attenuation, no ischemia-->Low risk.  . Chronic kidney disease    acute renal failure secondary to dehydration which is now resolved  . Cystocele   . Diastolic dysfunction    a. 10/2015 Echo: EF 60-65%, no rwma, Gr1 DD, mild MR, mildly dil LA, nl RV fxn, PASP 58mmHg.  [name] GERD (gastroesophageal reflux disease)   . Headache(784.0)    chronic  . Hyperlipidemia   . Hypertension   . Knee fracture   . Light headedness    due to dehydration  . Mobitz type 2 second degree atrioventricular block    a. felt to be 2/2 amiodarone, resolved with decreased amiodarone dose  . Persistent atrial fibrillation ([place])  a. status post multiple DCCV, most recently in 07/2013  . PONV (postoperative nausea and vomiting)    oxycodone and codiene cause N/V   . Pre-syncope   . Tachycardia induced cardiomyopathy ([place])   . Venous insufficiency   . Vertigo     Past Surgical History:  Procedure Laterality Date  . ABDOMINAL HYSTERECTOMY  1990  . APPENDECTOMY    . AUGMENTATION MAMMAPLASTY Bilateral 1986   implants  . AUGMENTATION MAMMAPLASTY  1990  . AUGMENTATION MAMMAPLASTY  2011  . CARDIAC CATHETERIZATION    . CARDIOVERSION     x 3  . CARDIOVERSION    . CARDIOVERSION N/A 02/07/2017   Procedure: CARDIOVERSION;  Surgeon: Wellington Hampshire, MD;  Location: [facility] OR[facility];  Service: Cardiovascular;  Laterality: N/A;  . CARDIOVERSION N/A 07/22/2017   Procedure: Cardioversion;   Surgeon: Minna Merritts, MD;  Location: [facility];  Service: Cardiovascular;  Laterality: N/A;  . CATARACT EXTRACTION W/PHACO Right 09/21/2016   Procedure: CATARACT EXTRACTION PHACO AND INTRAOCULAR LENS PLACEMENT ([place]);  Surgeon: Birder Robson, MD;  Location: [facility] OR[facility];  Service: Ophthalmology;  Laterality: Right;  [place] 44.1 AP% 16.5 CDE 7.30 Fluid Pack Lot #[id number]  . CATARACT EXTRACTION W/PHACO Left 10/19/2016   Procedure: CATARACT EXTRACTION PHACO AND INTRAOCULAR LENS PLACEMENT ([facility]);  Surgeon: Birder Robson, MD;  Location: [facility];  Service: Ophthalmology;  Laterality: Left;  [place] 53.7 AP% 19.5 CDE 10.45 Fluid pack lot # [id number]  . CHOLECYSTECTOMY    . COMBINED AUGMENTATION MAMMAPLASTY AND ABDOMINOPLASTY    . JOINT REPLACEMENT Left 06/04/2013   left knee  . KNEE ARTHROSCOPY Right 08/16/2016   Procedure: ARTHROSCOPY KNEE, tear posterior horn medial meniscus, tear anterior and posterior horns of lateral meniscus, chondromalacia of lateral compartment grade 3 patella and grade 4 medial;  Surgeon: Dereck Leep, MD;  Location: [facility] ORS;  Service: Orthopedics;  Laterality: Right;  . MASTECTOMY  1986   Bilateral with silicone  breast implants, s/p saline replacements  . Multiple orthopedic procedures    . NOSE SURGERY    . TEE WITHOUT CARDIOVERSION N/A 09/26/2017   Procedure: TRANSESOPHAGEAL ECHOCARDIOGRAM (TEE);  Surgeon: Fay Records, MD;  Location: [facility] ENDOSCOPY;  Service: Cardiovascular;  Laterality: N/A;  . TOTAL KNEE ARTHROPLASTY Left     Family History  Problem Relation Age of Onset  . Heart disease Mother   . Cancer Father        stomach  . Heart disease Son        found at autopsy  . Cancer Sister        breast  . Breast cancer Sister   . Cancer Maternal Grandmother        breast  . Breast cancer Maternal Grandmother   . Cancer Sister        breast  . Breast cancer Sister   . Breast cancer Sister   . Cancer Sister        breast  . Diabetes Brother   . Ovarian  cancer Neg Hx     Social History   Social History  . Marital status: Married    Spouse name: N/A  . Number of children: 1  . Years of education: N/A   Occupational History  .  Retired   Social History Main Topics  . Smoking status: Never Smoker  . Smokeless tobacco: Never Used  . Alcohol use No  . Drug use: No  . Sexual activity: No   Other Topics Concern  . Not on  file   Social History Narrative  . No narrative on file   The PMH, PSH, Social History, Family History, Medications, and allergies have been reviewed in [facility], and have been updated if relevant.  OBJECTIVE:  BP (!) 146/84 (BP Location: Left Arm, Patient Position: Sitting, Cuff Size: Normal)   Pulse 95   Temp (!) 97.5 F (36.4 C) (Oral)   Ht 5\' 4"  (1.626 m)   Wt 157 lb 1.9 oz (71.3 kg)   SpO2 99%   BMI 26.97 kg/m   Appears well, in no apparent distress.  Vital signs are normal. The abdomen is soft without tenderness, guarding, mass, rebound or organomegaly. No CVA tenderness or inguinal adenopathy noted. Urine dipstick shows negative for all components.  Micro exam: not done.   ASSESSMENT: UA neg- bladder irritation.  PLAN: . Frequency may be due to bladder irritants... Drink water, avoid alcohol, caffeine, soda, citris, tomato, spicy foods.  . Call or return to clinic prn if these symptoms worsen or fail to improve as anticipated.

## 2017-10-12 ENCOUNTER — Telehealth: Payer: Self-pay | Admitting: Internal Medicine

## 2017-10-12 ENCOUNTER — Telehealth: Payer: Self-pay | Admitting: Cardiovascular Disease

## 2017-10-12 ENCOUNTER — Emergency Department
Admission: EM | Admit: 2017-10-12 | Discharge: 2017-10-12 | Disposition: A | Discharge status: Home / Self Care | Payer: Medicare Other | Attending: Emergency Medicine | Admitting: Emergency Medicine

## 2017-10-12 ENCOUNTER — Encounter: Payer: Self-pay | Admitting: Emergency Medicine

## 2017-10-12 ENCOUNTER — Emergency Department: Payer: Medicare Other

## 2017-10-12 DIAGNOSIS — I130 Hypertensive heart and chronic kidney disease with heart failure and stage 1 through stage 4 chronic kidney disease, or unspecified chronic kidney disease: Secondary | ICD-10-CM | POA: Diagnosis not present

## 2017-10-12 DIAGNOSIS — R51 Headache: Secondary | ICD-10-CM | POA: Insufficient documentation

## 2017-10-12 DIAGNOSIS — N189 Chronic kidney disease, unspecified: Secondary | ICD-10-CM | POA: Diagnosis not present

## 2017-10-12 DIAGNOSIS — S199XXA Unspecified injury of neck, initial encounter: Secondary | ICD-10-CM | POA: Diagnosis not present

## 2017-10-12 DIAGNOSIS — R519 Headache, unspecified: Secondary | ICD-10-CM

## 2017-10-12 DIAGNOSIS — I5032 Chronic diastolic (congestive) heart failure: Secondary | ICD-10-CM | POA: Diagnosis not present

## 2017-10-12 DIAGNOSIS — Z79899 Other long term (current) drug therapy: Secondary | ICD-10-CM | POA: Insufficient documentation

## 2017-10-12 DIAGNOSIS — R110 Nausea: Secondary | ICD-10-CM

## 2017-10-12 DIAGNOSIS — W1789XA Other fall from one level to another, initial encounter: Secondary | ICD-10-CM | POA: Diagnosis not present

## 2017-10-12 DIAGNOSIS — Y929 Unspecified place or not applicable: Secondary | ICD-10-CM | POA: Diagnosis not present

## 2017-10-12 DIAGNOSIS — Z7901 Long term (current) use of anticoagulants: Secondary | ICD-10-CM | POA: Diagnosis not present

## 2017-10-12 DIAGNOSIS — Y999 Unspecified external cause status: Secondary | ICD-10-CM | POA: Diagnosis not present

## 2017-10-12 DIAGNOSIS — Y93H2 Activity, gardening and landscaping: Secondary | ICD-10-CM | POA: Insufficient documentation

## 2017-10-12 DIAGNOSIS — S0990XA Unspecified injury of head, initial encounter: Secondary | ICD-10-CM

## 2017-10-12 MED ORDER — DOCUSATE SODIUM 100 MG PO CAPS: ORAL_CAPSULE | ORAL | 0 refills | Status: DC

## 2017-10-12 MED ORDER — HYDROCODONE-ACETAMINOPHEN 5-325 MG PO TABS: 1.0000 | ORAL_TABLET | Freq: Four times a day (QID) | ORAL | 0 refills | Status: DC | PRN

## 2017-10-12 MED ORDER — ONDANSETRON 4 MG PO TBDP: ORAL_TABLET | ORAL | 0 refills | Status: DC

## 2017-10-12 NOTE — Telephone Encounter (Signed)
Pt called about her BP being high this morning 1:15 am 137/76 heart rate was 94 and 6:50 am 143/49 heart rate 73 and 11:00 am 156/97 heart rate 78. Pt had a fall last week she fell on her side and hit her head. Pt woke up this morning and vomited. Pt is being transferred to team health. Thank you!

## 2017-10-12 NOTE — Telephone Encounter (Addendum)
Pt called c/o SBP 140s, headache, and nausea since yesterday.  She has known afib; HR controlled 70s-90s. Awaiting Nov 16 ablation with Dr. Rayann Heman.  Amlodipine was changed to metoprolol at Sept 4 OV w/Dr. Fletcher Anon.  Since that time, pt reports having difficulty keeping pressures under control. She did mention epigastric pain and burping that started last evening; worse with her headache. She has hx of indigestion for which she takes Maalox but has not been able to tolerate Maalox d/t nausea. She last ate cheese toast and drank ginger ale. Vomited once; she has a history of migraines accompanied by vomiting.  She fell outside last week and landed on her shoulder. I asked her numerous times if she hit her head and pt states she did not. Denies any bruising or suspicions of a bone break. I reviewed pt concerns with Dr. Saunders Revel who recommends pt add back amlodipine 2.5mg  qd for BP control.  She should monitor sx and proceed to ED if sx continue or worsen.   Pt agreeable and will call back this afternoon to report pressures and sx.

## 2017-10-12 NOTE — Telephone Encounter (Signed)
Patient went to the ED, thanks

## 2017-10-12 NOTE — Discharge Instructions (Addendum)
You were seen in the Emergency Department (ED) today for a head injury.  Based on your evaluation, you may have sustained a concussion (or bruise) to your brain.  Fortunately, your CT scan did not show any evidence of serious injury or bleeding.    Symptoms to expect from a concussion include nausea, mild to moderate headache, difficulty concentrating or sleeping, and mild lightheadedness.  These symptoms should improve over the next few days to weeks, but it may take many weeks before you feel back to normal.  Return to the emergency department or follow-up with your primary care doctor if your symptoms are not improving over this time.  Signs of a more serious head injury include vomiting, severe headache, excessive sleepiness or confusion, and weakness or numbness in your face, arms or legs.  Return immediately to the Emergency Department if you experience any of these more concerning symptoms.    Rest, avoid strenuous physical or mental activity, and avoid activities that could potentially result in another head injury until all your symptoms from this head injury are completely resolved for at least 2-3 weeks.  You may take ibuprofen or acetaminophen over the counter according to label instructions for mild headache or scalp soreness.  Take Norco as prescribed for severe pain. Do not drink alcohol, drive or participate in any other potentially dangerous activities while taking this medication as it may make you sleepy. Do not take this medication with any other sedating medications, either prescription or over-the-counter. If you were prescribed Percocet or Vicodin, do not take these with acetaminophen (Tylenol) as it is already contained within these medications.   This medication is an opiate (or narcotic) pain medication and can be habit forming.  Use it as little as possible to achieve adequate pain control.  Do not use or use it with extreme caution if you have a history of opiate abuse or  dependence.  If you are on a pain contract with your primary care doctor or a pain specialist, be sure to let them know you were prescribed this medication today from the [facility] Emergency Department.  This medication is intended for your use only - do not give any to anyone else and keep it in a secure place where nobody else, especially children, have access to it.  It will also cause or worsen constipation, so you may want to consider taking an over-the-counter stool softener while you are taking this medication.

## 2017-10-12 NOTE — Telephone Encounter (Signed)
Patient arrived at ED about 12:30.

## 2017-10-12 NOTE — Telephone Encounter (Signed)
Please advise 

## 2017-10-12 NOTE — ED Triage Notes (Signed)
Pt from home with headache since she fell a week ago. She states that she fell off her lawn mower and her head and shoulder jerked and jarred; denies loc, bleeding. Pt takes eliquis. Pt states headache has continued and she called her pcp, who had her come here for evaluation of possible concussion. Pt alert & oriented with NAD noted.

## 2017-10-12 NOTE — Telephone Encounter (Signed)
Pt calling stating she is having really bad headaches And with that she's not able to keep her BP down  10/12/17  525 am 141/99 650 am 143/79   Would like some advise to see if she needs to be seen for this She states she knows she's in Afib but would like a call back on this concern

## 2017-10-12 NOTE — ED Provider Notes (Signed)
[facility] Emergency Department Provider Note  ____________________________________________   First MD Initiated Contact with Patient 10/12/17 1554     (approximate)  I have reviewed the triage vital signs and the nursing notes.   HISTORY  Chief Complaint Headache and Nausea    HPI [name] [name] is a 81 y.o. female with chronic A. fib on Eliquis who presents about 1 week after sustaining a head injury for evaluation of persistent headache and at least one episode of vomiting and some persistent nausea.  She was riding on the back of a lawn tractor when she fell off and struck her head on the ground.  She did not lose consciousness but says it caused quite a jolt.  She had some muscle tenderness in her sides of her neck which is improved but she still intermittently having headache.  It was more noticeable yesterday and today although it has improved since coming to the emergency department.  It feels like a generalized dull aching pain.  Nothing in particular makes it better nor worse.  She denies any visual changes.  She feels like she may have been a little bit confused when she woke up this morning but she says that that happens to her sometimes.  She denies neck pain, chest pain, shortness of breath, abdominal pain, and dysuria.  She wanted to follow-up with her primary care doctor but was not able to get in touch with them and she  Felt like she should be evaluated to make sure that her head was okay.  She is ambulatory without difficulty.   Past Medical History:  Diagnosis Date  . Arthritis   . Atrial flutter ([place]) 02/2011   s/p cardioversion   . Cancer (HCC)    BREAST  . Chest pain    a. H/o cardiac cath x 2, last 2012 -->nl cors;  b. 12/2016 MV: EF 61%, small region of mild perfusion defect in the apical anteroseptal region c/w breast attenuation, no ischemia-->Low risk.  . Chronic kidney disease    acute renal failure secondary to dehydration which  is now resolved  . Cystocele   . Diastolic dysfunction    a. 10/2015 Echo: EF 60-65%, no rwma, Gr1 DD, mild MR, mildly dil LA, nl RV fxn, PASP 54mmHg.  [name] GERD (gastroesophageal reflux disease)   . Headache(784.0)    chronic  . Hyperlipidemia   . Hypertension   . Knee fracture   . Light headedness    due to dehydration  . Mobitz type 2 second degree atrioventricular block    a. felt to be 2/2 amiodarone, resolved with decreased amiodarone dose  . Persistent atrial fibrillation ([place])    a. status post multiple DCCV, most recently in 07/2013  . PONV (postoperative nausea and vomiting)    oxycodone and codiene cause N/V   . Pre-syncope   . 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 Venous insufficiency   . Vertigo     Patient Active Problem List   Diagnosis Date Noted  . Pain of right scapula 08/01/2017  . Chest pain 07/22/2017  . Sinusitis 06/14/2017  . Influenza 01/10/2017  . Rash and nonspecific skin eruption 11/25/2016  . Concussion with no loss of consciousness 09/01/2016  . Chronic venous insufficiency 09/01/2016  . Preoperative clearance 06/29/2016  . Other fatigue 06/29/2016  . Visit for preventive health examination 12/15/2015  . GERD (gastroesophageal reflux disease) 11/20/2015  . Cystocele 11/18/2015  . Unstable bladder 11/18/2015  . Vaginal atrophy  11/18/2015  . Chronic suprapubic pain 10/14/2015  . Inguinal hernia 10/13/2015  . Impaired fasting glucose 04/24/2015  . Pulmonary hypertension, moderate to severe ([place]) 04/24/2015  . Arthritis of knee, degenerative 02/21/2015  . Vitamin D deficiency 12/27/2014  . S/P TAH-BSO 12/13/2014  . S/P bilateral mastectomy 12/13/2014  . Long term current use of anticoagulant therapy 09/25/2014  . HH (hiatus hernia) 04/20/2014  . 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 Unspecified vitamin D deficiency 04/24/2013  . Family history of breast cancer in female 04/23/2013  . Medicare annual wellness visit,  subsequent 04/23/2013  . Obesity 06/30/2012  . History of [place] spotted fever 06/22/2012  . Persistent atrial fibrillation ([place]) 05/09/2012  . Anxiety and depression 05/09/2012  . TACHYCARDIA 02/01/2011  . Hyperlipidemia 07/08/2010  . Essential hypertension 03/05/2009    Past Surgical History:  Procedure Laterality Date  . ABDOMINAL HYSTERECTOMY  1990  . APPENDECTOMY    . AUGMENTATION MAMMAPLASTY Bilateral 1986   implants  . AUGMENTATION MAMMAPLASTY  1990  . AUGMENTATION MAMMAPLASTY  2011  . CARDIAC CATHETERIZATION    . CARDIOVERSION     x 3  . CARDIOVERSION    . CARDIOVERSION N/A 02/07/2017   Procedure: CARDIOVERSION;  Surgeon: Wellington Hampshire, MD;  Location: [facility] ORS;  Service: Cardiovascular;  Laterality: N/A;  . CARDIOVERSION N/A 07/22/2017   Procedure: Cardioversion;  Surgeon: Minna Merritts, MD;  Location: [facility] ORS;  Service: Cardiovascular;  Laterality: N/A;  . CATARACT EXTRACTION W/PHACO Right 09/21/2016   Procedure: CATARACT EXTRACTION PHACO AND INTRAOCULAR LENS PLACEMENT ([place]);  Surgeon: Birder Robson, MD;  Location: [facility] ORS;  Service: Ophthalmology;  Laterality: Right;  [place] 44.1 AP% 16.5 CDE 7.30 Fluid Pack Lot #[id number]  . CATARACT EXTRACTION W/PHACO Left 10/19/2016   Procedure: CATARACT EXTRACTION PHACO AND INTRAOCULAR LENS PLACEMENT (IOC);  Surgeon: Birder Robson, MD;  Location: [facility] ORS;  Service: Ophthalmology;  Laterality: Left;  [place] 53.7 AP% 19.5 CDE 10.45 Fluid pack lot # [id number]  . CHOLECYSTECTOMY    . COMBINED AUGMENTATION MAMMAPLASTY AND ABDOMINOPLASTY    . JOINT REPLACEMENT Left 06/04/2013   left knee  . KNEE ARTHROSCOPY Right 08/16/2016   Procedure: ARTHROSCOPY KNEE, tear posterior horn medial meniscus, tear anterior and posterior horns of lateral meniscus, chondromalacia of lateral compartment grade 3 patella and grade 4 medial;  Surgeon: Dereck Leep, MD;  Location: [facility] ORS;  Service: Orthopedics;  Laterality: Right;  . MASTECTOMY   1986   Bilateral with silicone  breast implants, s/p saline replacements  . Multiple orthopedic procedures    . NOSE SURGERY    . TEE WITHOUT CARDIOVERSION N/A 09/26/2017   Procedure: TRANSESOPHAGEAL ECHOCARDIOGRAM (TEE);  Surgeon: Fay Records, MD;  Location: [place];  Service: Cardiovascular;  Laterality: N/A;  . TOTAL KNEE ARTHROPLASTY Left     Prior to Admission medications   Medication Sig Start Date End Date Taking? Authorizing Provider  acetaminophen (TYLENOL) 500 MG tablet Take 1,000 mg by mouth every 6 (six) hours as needed (pain).     [provider]  aluminum-magnesium hydroxide 200-200 MG/5ML suspension Take 20 mLs by mouth every 6 (six) hours as needed for indigestion.     [provider]  amiodarone (PACERONE) 200 MG tablet Take 1 tablet (200 mg total) by mouth daily. 08/23/17   Wellington Hampshire, MD  apixaban (ELIQUIS) 5 MG TABS tablet Take 1 tablet (5 mg total) by mouth 2 (two) times daily. 01/30/16   Wellington Hampshire, MD  cholecalciferol (  VITAMIN D) 1000 units tablet Take 1,000 Units by mouth daily.    [provider]  docusate sodium (COLACE) 100 MG capsule Take 1 tablet once or twice daily as needed for constipation while taking narcotic pain medicine 10/12/17   Hinda Kehr, MD  fluticasone [facility]) 50 MCG/ACT nasal spray Place 2 sprays into both nostrils daily. Patient taking differently: Place 2 sprays into both nostrils daily as needed for allergies or rhinitis.  12/29/15   Rubbie Battiest, RN  HYDROcodone-acetaminophen (NORCO/VICODIN) 5-325 MG tablet Take 1 tablet by mouth every 6 (six) hours as needed for moderate pain. 10/12/17   Hinda Kehr, MD  metoprolol tartrate (LOPRESSOR) 25 MG tablet Take 0.5 tablet (12.5 mg) by mouth in the AM and take 0.5 tablet (12.5 mg) by mouth in the PM as needed for increased heart rate    [provider]  ondansetron (ZOFRAN ODT) 4 MG disintegrating tablet Allow 1-2 tablets to dissolve in your mouth  every 8 hours as needed for nausea/vomiting 10/12/17   Hinda Kehr, MD  pantoprazole (PROTONIX) 40 MG tablet Take 1 tablet (40 mg total) by mouth daily. 12/31/16   Wellington Hampshire, MD  pravastatin (PRAVACHOL) 20 MG tablet TAKE ONE (1) TABLET BY MOUTH EVERY DAY 04/12/17   [name], MD  Probiotic Product (PROBIOTIC ADVANCED PO) Take 1 tablet by mouth daily.    [provider]  sodium chloride (OCEAN) 0.65 % SOLN nasal spray Place 2 sprays into both nostrils 2 (two) times daily as needed for congestion.     [provider]    Allergies Iodine; Biaxin [clarithromycin]; Codeine; Enalapril; Fluocinonide; Oxycodone; Promethazine; Warfarin and related; and Xarelto [rivaroxaban]  Family History  Problem Relation Age of Onset  . Heart disease Mother   . Cancer Father        stomach  . Heart disease Son        found at autopsy  . Cancer Sister        breast  . Breast cancer Sister   . Cancer Maternal Grandmother        breast  . Breast cancer Maternal Grandmother   . Cancer Sister        breast  . Breast cancer Sister   . Breast cancer Sister   . Cancer Sister        breast  . Diabetes Brother   . Ovarian cancer Neg Hx     Social History Social History  Substance Use Topics  . Smoking status: Never Smoker  . Smokeless tobacco: Never Used  . Alcohol use No    Review of Systems Constitutional: No fever/chills Eyes: No visual changes. ENT: No sore throat. Cardiovascular: Denies chest pain. Respiratory: Denies shortness of breath. Gastrointestinal: No abdominal pain.  Intermittent nausea after head injury, one episode emesis yesterday.  No diarrhea.  No constipation. Genitourinary: Negative for dysuria. Musculoskeletal: Negative for neck pain.  Negative for back pain. Integumentary: Negative for rash Neurological: Intermittent global headache after head injury 1 week ago, no focal  numbness/weakness   ____________________________________________   PHYSICAL EXAM:  VITAL SIGNS: ED Triage Vitals  Enc Vitals Group     BP 10/12/17 1248 138/82     Pulse Rate 10/12/17 1248 85     Resp 10/12/17 1248 16     Temp 10/12/17 1248 97.9 F (36.6 C)     Temp Source 10/12/17 1248 Oral     SpO2 10/12/17 1248 98 %     Weight 10/12/17  1249 71.2 kg (157 lb)     Height 10/12/17 1249 1.638 m (5' 4.5")     Head Circumference --      Peak Flow --      Pain Score 10/12/17 1316 10     Pain Loc --      Pain Edu? --      Excl. in [place]? --     Constitutional: Alert and oriented. Well appearing and in no acute distress. Eyes: Conjunctivae are normal. PERRL. EOMI. Head: Atraumatic.  No evidence of contusion or hematoma Nose: No congestion/rhinnorhea. Mouth/Throat: Mucous membranes are moist. Neck: No stridor.  No meningeal signs.  No cervical spine tenderness to palpation. Cardiovascular: Normal rate. Good peripheral circulation.  Respiratory: Normal respiratory effort.  No retractions.  Gastrointestinal: Soft and nontender. No distention.  Musculoskeletal: No lower extremity tenderness nor edema. No gross deformities of extremities.  No pain w/ ROM of shoulders/upper extremities Neurologic:  Normal speech and language. No gross focal neurologic deficits are appreciated.  Skin:  Skin is warm, dry and intact. No rash noted. Psychiatric: Mood and affect are normal. Speech and behavior are normal.  ____________________________________________   LABS (all labs ordered are listed, but only abnormal results are displayed)  Labs Reviewed - No data to display ____________________________________________  EKG  None - EKG not ordered by ED physician ____________________________________________  RADIOLOGY   Ct Head Wo Contrast  Result Date: 10/12/2017 CLINICAL DATA:  Pt from home with headache since she fell a week ago. She states that she fell off her lawn mower and her head  and shoulder jerked and jarred. No loss of consciousness. EXAM: CT HEAD WITHOUT CONTRAST CT CERVICAL SPINE WITHOUT CONTRAST TECHNIQUE: Multidetector CT imaging of the head and cervical spine was performed following the standard protocol without intravenous contrast. Multiplanar CT image reconstructions of the cervical spine were also generated. COMPARISON:  None. FINDINGS: CT HEAD FINDINGS Brain: No evidence of acute infarction, hemorrhage, hydrocephalus, extra-axial collection or mass lesion/mass effect. Mild generalized cerebral atrophy. Vascular: No hyperdense vessel or unexpected calcification. Skull: No osseous abnormality. Sinuses/Orbits: Visualized paranasal sinuses are clear. Visualized mastoid sinuses are clear. Visualized orbits demonstrate no focal abnormality. Other: None CT CERVICAL SPINE FINDINGS Alignment: Normal. Skull base and vertebrae: No acute fracture. No primary bone lesion or focal pathologic process. Soft tissues and spinal canal: No prevertebral fluid or swelling. No visible canal hematoma. Disc levels: Degenerative disc disease with disc height loss at C3-4, C4-5, C5-6 and C6-7. Mild right facet arthropathy at C2-3. Moderate bilateral facet arthropathy at C3-4. Mild right and severe left facet arthropathy at C4-5 with mild left foraminal stenosis. At C5-6 there is a broad-based disc osteophyte complex with left uncovertebral degenerative changes and mild left foraminal stenosis. Moderate left facet arthropathy at C7-T1. Upper chest: Lung apices are clear. Other: No fluid collection or hematoma. IMPRESSION: 1. No acute intracranial pathology. 2.  No acute osseous injury of the cervical spine. 3. Cervical spine spondylosis as described above. Electronically Signed   By: Kathreen Devoid   On: 10/12/2017 14:06   Ct Cervical Spine Wo Contrast  Result Date: 10/12/2017 CLINICAL DATA:  Pt from home with headache since she fell a week ago. She states that she fell off her lawn mower and her head  and shoulder jerked and jarred. No loss of consciousness. EXAM: CT HEAD WITHOUT CONTRAST CT CERVICAL SPINE WITHOUT CONTRAST TECHNIQUE: Multidetector CT imaging of the head and cervical spine was performed following the standard protocol without intravenous contrast.  Multiplanar CT image reconstructions of the cervical spine were also generated. COMPARISON:  None. FINDINGS: CT HEAD FINDINGS Brain: No evidence of acute infarction, hemorrhage, hydrocephalus, extra-axial collection or mass lesion/mass effect. Mild generalized cerebral atrophy. Vascular: No hyperdense vessel or unexpected calcification. Skull: No osseous abnormality. Sinuses/Orbits: Visualized paranasal sinuses are clear. Visualized mastoid sinuses are clear. Visualized orbits demonstrate no focal abnormality. Other: None CT CERVICAL SPINE FINDINGS Alignment: Normal. Skull base and vertebrae: No acute fracture. No primary bone lesion or focal pathologic process. Soft tissues and spinal canal: No prevertebral fluid or swelling. No visible canal hematoma. Disc levels: Degenerative disc disease with disc height loss at C3-4, C4-5, C5-6 and C6-7. Mild right facet arthropathy at C2-3. Moderate bilateral facet arthropathy at C3-4. Mild right and severe left facet arthropathy at C4-5 with mild left foraminal stenosis. At C5-6 there is a broad-based disc osteophyte complex with left uncovertebral degenerative changes and mild left foraminal stenosis. Moderate left facet arthropathy at C7-T1. Upper chest: Lung apices are clear. Other: No fluid collection or hematoma. IMPRESSION: 1. No acute intracranial pathology. 2.  No acute osseous injury of the cervical spine. 3. Cervical spine spondylosis as described above. Electronically Signed   By: Kathreen Devoid   On: 10/12/2017 14:06    ____________________________________________   PROCEDURES  Critical Care performed: No   Procedure(s) performed:    Procedures   ____________________________________________   INITIAL IMPRESSION / ASSESSMENT AND PLAN / ED COURSE  As part of my medical decision making, I reviewed the following data within the electronic MEDICAL RECORD NUMBER History obtained from family and Old chart reviewed    Differential diagnosis includes intracranial bleeding particularly given that she is on Eliquis, concussion/post concussive syndrome, CVA, migraine, etc.  Fortunately she has only very mild residual headache at this time and no other signs or symptoms that are concerning.  She has had some nausea and an episode of vomiting and she is not sure about the confusion; we had a talk about concussion and postconcussive syndrome and precautions.  However her CT scans were reassuring with no acute abnormality and she has no focal neurological deficits.  She should be safe for discharge and outpatient follow-up.  She is comfortable with the plan.  She has for something for pain and states that tramadol has not worked before, but recently she had some hydrocodone which did help.  I gave her strict precautions given her age for the use of hydrocodone and asked her to please only use it if absolutely necessary and she agrees.  I also warned her about the risk of constipation and gave her a prescription for Colace.     ____________________________________________  FINAL CLINICAL IMPRESSION(S) / ED DIAGNOSES  Final diagnoses:  Injury of head, initial encounter  Nausea  Acute nonintractable headache, unspecified headache type     MEDICATIONS GIVEN DURING THIS VISIT:  Medications - No data to display   NEW OUTPATIENT MEDICATIONS STARTED DURING THIS VISIT:  Discharge Medication List as of 10/12/2017  4:40 PM    START taking these medications   Details  docusate sodium (COLACE) 100 MG capsule Take 1 tablet once or twice daily as needed for constipation while taking narcotic pain medicine, Print     HYDROcodone-acetaminophen (NORCO/VICODIN) 5-325 MG tablet Take 1 tablet by mouth every 6 (six) hours as needed for moderate pain., Starting Wed 10/12/2017, Print    ondansetron (ZOFRAN ODT) 4 MG disintegrating tablet Allow 1-2 tablets to dissolve in your mouth every 8 hours as  needed for nausea/vomiting, Print        Discharge Medication List as of 10/12/2017  4:40 PM      Discharge Medication List as of 10/12/2017  4:40 PM       Note:  This document was prepared using Dragon voice recognition software and may include unintentional dictation errors.    Hinda Kehr, MD 10/12/17 [phone]

## 2017-10-12 NOTE — Telephone Encounter (Signed)
[place]  Patient Name: [name] [name]  DOB: 1936-07-19    Initial Comment Caller states she woke up vomiting, BP: 137/76 high 1:15am, in AFIB, HR: 94. 6:50am: 143/49, HR: 73. 11am: 156/97, HR: 78. Fell last week, hit head, been dizzy from that. No appts available at this time. Headache as well.    Nurse Assessment  Nurse: Sherrell Puller, RN, Amy Date/Time Eilene Ghazi Time): 10/12/2017 11:21:15 AM  Confirm and document reason for call. If symptomatic, describe symptoms. ---Caller states she has A-Fib, was supposed to have ablation recently but there was miscommunication about medications and she was unable to have the procedure done. Had a fall last week off the back of a trailer onto grass when she was reaching out for something but she doesn't remember hitting her head, has had some dizziness but she says she feels like her Metoprolol has been causing that. Says BP was elevated this morning, headache and nausea this morning, vomited one time.  Does the patient have any new or worsening symptoms? ---Yes  Will a triage be completed? ---Yes  Related visit to physician within the last 2 weeks? ---No  Does the PT have any chronic conditions? (i.e. diabetes, asthma, etc.) ---Yes  List chronic conditions. ---A-Fib, HTN  Is this a behavioral health or substance abuse call? ---No     Guidelines    Guideline Title Affirmed Question Affirmed Notes  Headache [1] SEVERE headache (e.g., excruciating) AND [2] not improved after 2 hours of pain medicine    Final Disposition User   See Physician within 4 Hours (or PCP triage) Sherrell Puller, RN, Amy    Comments  Called patient back to discuss an appt time, but she states that she decided to go to the ED.   Referrals  REFERRED TO PCP OFFICE   Caller Disagree/Comply Comply  Caller Understands Yes  PreDisposition Go to ED

## 2017-10-12 NOTE — ED Notes (Signed)
Pt verbalizes understandings of d/c teaching and RX. Pt in NAD at time of d/c, pt ambulatory with spouse to lobby. Pt unable to sign due to signature pad issues

## 2017-10-13 ENCOUNTER — Other Ambulatory Visit: Payer: Self-pay

## 2017-10-13 ENCOUNTER — Telehealth: Payer: Self-pay | Admitting: Internal Medicine

## 2017-10-13 MED ORDER — AMLODIPINE BESYLATE 2.5 MG PO TABS: 2.5000 mg | ORAL_TABLET | Freq: Every day | ORAL | 3 refills | Status: DC

## 2017-10-13 NOTE — Telephone Encounter (Signed)
yes

## 2017-10-13 NOTE — Patient Outreach (Signed)
Outreach patient after ED visit on 10/12/17 at [facility].  I spoke with patient and she said she was doing better.  She learned what to do and what not to do.  She stated that she is in the process of scheduling a follow up with her PCP.  She said it may take a few days to get through but she started reaching out to her this morning.  She thanked me for the call and hung up.  I did not get to engage patient and explain the 24 Hour Nurse Advice Line or [place].  Unsuccessful letter, magnet and know before you go will be mailed on 10/13/17.

## 2017-10-13 NOTE — Telephone Encounter (Signed)
Can you please schedule pt for Monday the 29th at 11:30am. Thank you so much!

## 2017-10-13 NOTE — Telephone Encounter (Signed)
Pt called and left a message about setting up pelvic exam. How soon did this need to be done? Please advise, thank you!

## 2017-10-13 NOTE — Telephone Encounter (Signed)
Pt called back and also stated that she needs a 5 day ed follow up

## 2017-10-13 NOTE — Telephone Encounter (Signed)
Patient scheduled.

## 2017-10-13 NOTE — Telephone Encounter (Signed)
Spoke with pt and there was a miss understanding about the pelvic exam pt was under the impression that she needed to have it done. I explained to the pt that she only needs the pelvic exam done if she continues to have the urinary symptoms. Pt stated that she does not need the pelvic exam but would like to still schedule the ED follow up. She stated that the doctor at the ED would like for her to follow up in 5 days which would be Monday. Is it okay to schedule pt for Monday at 11:30am?

## 2017-10-17 ENCOUNTER — Ambulatory Visit: Payer: Medicare Other | Admitting: Internal Medicine

## 2017-10-17 DIAGNOSIS — Z0289 Encounter for other administrative examinations: Secondary | ICD-10-CM

## 2017-10-19 ENCOUNTER — Encounter: Payer: Self-pay | Admitting: Internal Medicine

## 2017-10-19 ENCOUNTER — Ambulatory Visit (INDEPENDENT_AMBULATORY_CARE_PROVIDER_SITE_OTHER): Payer: Medicare Other | Admitting: Internal Medicine

## 2017-10-19 VITALS — BP 128/84 | HR 75 | Ht 64.5 in | Wt 158.0 lb

## 2017-10-19 DIAGNOSIS — I481 Persistent atrial fibrillation: Secondary | ICD-10-CM | POA: Diagnosis not present

## 2017-10-19 DIAGNOSIS — I4819 Other persistent atrial fibrillation: Secondary | ICD-10-CM

## 2017-10-19 NOTE — Patient Instructions (Signed)
Medication Instructions:  Your physician has recommended you make the following change in your medication:   1.) Stop Amiodarone   -- If you need a refill on your cardiac medications before your next appointment, please call your pharmacy. --  Labwork: None ordered  Testing/Procedures: None ordered  Follow-Up: Your physician wants you to follow-up as needed with Dr. Rayann Heman.  You will receive a reminder letter in the mail two months in advance. If you don't receive a letter, please call our office to schedule the follow-up appointment.  Thank you for choosing [facility]!!   Frederik Schmidt, RN [phone]  Any Other Special Instructions Will Be Listed Below (If Applicable).

## 2017-10-19 NOTE — Progress Notes (Signed)
PCP: [name], MD Primary Cardiologist: Dr Fletcher Anon Primary EP: Dr Rayann Heman  [name] [name] is a 81 y.o. female who presents today for routine electrophysiology followup. She has persistent afib.  I saw her recently had had planned ablation.  Due to noncompliance with eliquis, we had to cancel her ablation.  She then fell off of a tractor 10/12/17 and had a concussion.  She was evaluated at [facility] ED and had head CT which was unrevealing.  She is doing well in afib.  She has decided to cancel afib ablation.  Heart rates and blood pressure are currently well controlled and she feels "good".   Today, she denies symptoms of palpitations, chest pain, shortness of breath,  lower extremity edema, dizziness, presyncope, or syncope.  The patient is otherwise without complaint today.   Past Medical History:  Diagnosis Date  . Arthritis   . Atrial flutter ([place]) 02/2011   s/p cardioversion   . Cancer (HCC)    BREAST  . Chest pain    a. H/o cardiac cath x 2, last 2012 -->nl cors;  b. 12/2016 MV: EF 61%, small region of mild perfusion defect in the apical anteroseptal region c/w breast attenuation, no ischemia-->Low risk.  . Chronic kidney disease    acute renal failure secondary to dehydration which is now resolved  . Cystocele   . Diastolic dysfunction    a. 10/2015 Echo: EF 60-65%, no rwma, Gr1 DD, mild MR, mildly dil LA, nl RV fxn, PASP 16mmHg.  [name] GERD (gastroesophageal reflux disease)   . Headache(784.0)    chronic  . Hyperlipidemia   . Hypertension   . Knee fracture   . Light headedness    due to dehydration  . Mobitz type 2 second degree atrioventricular block    a. felt to be 2/2 amiodarone, resolved with decreased amiodarone dose  . Persistent atrial fibrillation ([place])    a. status post multiple DCCV, most recently in 07/2013  . PONV (postoperative nausea and vomiting)    oxycodone and codiene cause N/V   . Pre-syncope   . Tachycardia induced cardiomyopathy ([place])   . Venous  insufficiency   . Vertigo    Past Surgical History:  Procedure Laterality Date  . ABDOMINAL HYSTERECTOMY  1990  . APPENDECTOMY    . AUGMENTATION MAMMAPLASTY Bilateral 1986   implants  . AUGMENTATION MAMMAPLASTY  1990  . AUGMENTATION MAMMAPLASTY  2011  . CARDIAC CATHETERIZATION    . CARDIOVERSION     x 3  . CARDIOVERSION    . CARDIOVERSION N/A 02/07/2017   Procedure: CARDIOVERSION;  Surgeon: Wellington Hampshire, MD;  Location: [facility] ORS;  Service: Cardiovascular;  Laterality: N/A;  . CARDIOVERSION N/A 07/22/2017   Procedure: Cardioversion;  Surgeon: Minna Merritts, MD;  Location: [facility] ORS;  Service: Cardiovascular;  Laterality: N/A;  . CATARACT EXTRACTION W/PHACO Right 09/21/2016   Procedure: CATARACT EXTRACTION PHACO AND INTRAOCULAR LENS PLACEMENT ([place]);  Surgeon: Birder Robson, MD;  Location: [facility] OR[facility];  Service: Ophthalmology;  Laterality: Right;  [place] 44.1 AP% 16.5 CDE 7.30 Fluid Pack Lot #[id number]  . CATARACT EXTRACTION W/PHACO Left 10/19/2016   Procedure: CATARACT EXTRACTION PHACO AND INTRAOCULAR LENS PLACEMENT (IOC);  Surgeon: Birder Robson, MD;  Location: [facility];  Service: Ophthalmology;  Laterality: Left;  [place] 53.7 AP% 19.5 CDE 10.45 Fluid pack lot # [id number]  . CHOLECYSTECTOMY    . COMBINED AUGMENTATION MAMMAPLASTY AND ABDOMINOPLASTY    . JOINT REPLACEMENT Left 06/04/2013   left knee  .  KNEE ARTHROSCOPY Right 08/16/2016   Procedure: ARTHROSCOPY KNEE, tear posterior horn medial meniscus, tear anterior and posterior horns of lateral meniscus, chondromalacia of lateral compartment grade 3 patella and grade 4 medial;  Surgeon: Dereck Leep, MD;  Location: [facility] OR[facility];  Service: Orthopedics;  Laterality: Right;  . MASTECTOMY  1986   Bilateral with silicone  breast implants, s/p saline replacements  . Multiple orthopedic procedures    . NOSE SURGERY    . TEE WITHOUT CARDIOVERSION N/A 09/26/2017   Procedure: TRANSESOPHAGEAL ECHOCARDIOGRAM (TEE);  Surgeon: Fay Records, MD;   Location[facility] END[facility]COPY;  Service: Cardiovascular;  Laterality: N/A;  . TOTAL KNEE ARTHROPLASTY Left     ROS- all systems are reviewed and negatives except as per HPI above  Current Outpatient Prescriptions  Medication Sig Dispense Refill  . acetaminophen (TYLENOL) 500 MG tablet Take 1,000 mg by mouth every 6 (six) hours as needed (pain).     [name] aluminum-magnesium hydroxide 200-200 MG/5ML suspension Take 20 mLs by mouth every 6 (six) hours as needed for indigestion.     [name] amiodarone (PACERONE) 200 MG tablet Take 1 tablet (200 mg total) by mouth daily. 30 tablet 6  . amLODipine (NORVASC) 2.5 MG tablet Take 2.5 mg by mouth 2 (two) times daily.    [name] apixaban (ELIQUIS) 5 MG TABS tablet Take 1 tablet (5 mg total) by mouth 2 (two) times daily. 60 tablet 6  . cholecalciferol (VITAMIN D) 1000 units tablet Take 1,000 Units by mouth daily.    [name] HYDROcodone-acetaminophen (NORCO/VICODIN) 5-325 MG tablet Take 1 tablet by mouth every 6 (six) hours as needed for moderate pain. 15 tablet 0  . metoprolol tartrate (LOPRESSOR) 25 MG tablet Take 25 mg by mouth 2 (two) times daily.     . ondansetron (ZOFRAN ODT) 4 MG disintegrating tablet Allow 1-2 tablets to dissolve in your mouth every 8 hours as needed for nausea/vomiting 30 tablet 0  . pantoprazole (PROTONIX) 40 MG tablet Take 1 tablet (40 mg total) by mouth daily. 30 tablet 11  . pravastatin (PRAVACHOL) 20 MG tablet TAKE ONE (1) TABLET BY MOUTH EVERY DAY 90 tablet 3  . Probiotic Product (PROBIOTIC ADVANCED PO) Take 1 tablet by mouth daily.    . sodium chloride (OCEAN) 0.65 % SOLN nasal spray Place 2 sprays into both nostrils 2 (two) times daily as needed for congestion.      No current facility-administered medications for this visit.     Physical Exam: Vitals:   10/19/17 0901  BP: 128/84  Pulse: 75  SpO2: 96%  Weight: 158 lb (71.7 kg)  Height: 5' 4.5" (1.638 m)    GEN- The patient is well appearing, alert and oriented x 3 today.   Head-  normocephalic, atraumatic Eyes-  Sclera clear, conjunctiva pink Ears- hearing intact Oropharynx- clear Lungs- Clear to ausculation bilaterally, normal work of breathing Heart- irregular rate and rhythm, no murmurs, rubs or gallops, PMI not laterally displaced GI- soft, NT, ND, + BS Extremities- no clubbing, cyanosis, or edema   Assessment and Plan:  1. Persistent afib We discussed options again today at length.  She has decided that she would prefer rate control going forward rather than ablation or further pursuits of sinus rhythm.  Given advanced age and LA enlargement, I have not been very enthusiastic about ablation.  I think that she has made the correct decision.  I will stop amiodarone at this time. Continue eliquis She will follow-up with Dr Fletcher Anon and I will see  as needed going forward.  Thompson Grayer MD, [facility] 10/19/2017 9:16 AM

## 2017-11-03 ENCOUNTER — Telehealth: Payer: Self-pay | Admitting: Internal Medicine

## 2017-11-03 IMAGING — CR DG CHEST 2V
2 series · 2 of 2 positions shown · non-contrast
Comparison: Chest x-rays dated 09/14/2014 and 11/05/2013.

CLINICAL DATA: Midsternal chest pain and tightness radiating to
shoulders. Dry cough for a few days. History of atrial fibrillation,
cardiomyopathy, hypertension.

EXAM:
CHEST  2 VIEW

[chest pa]
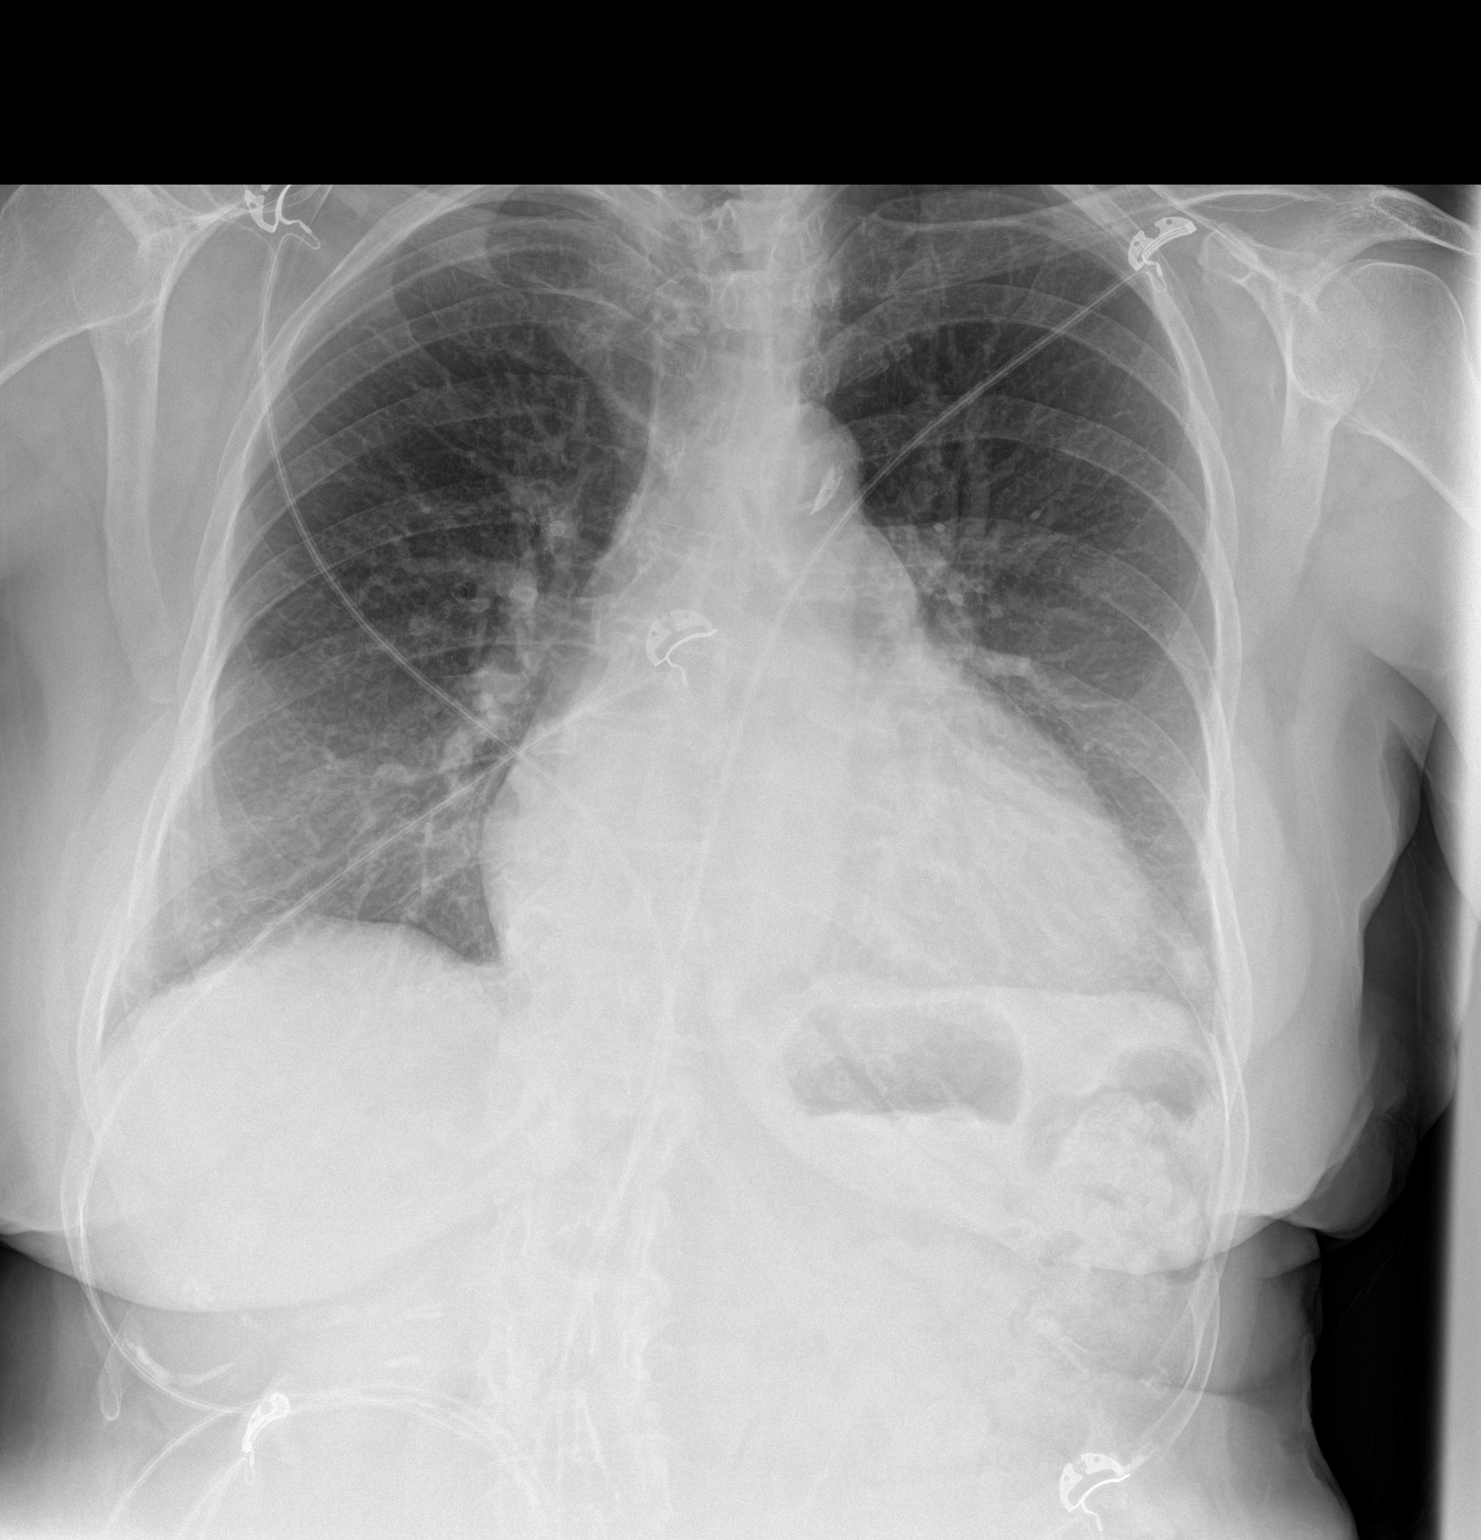

[chest lat]
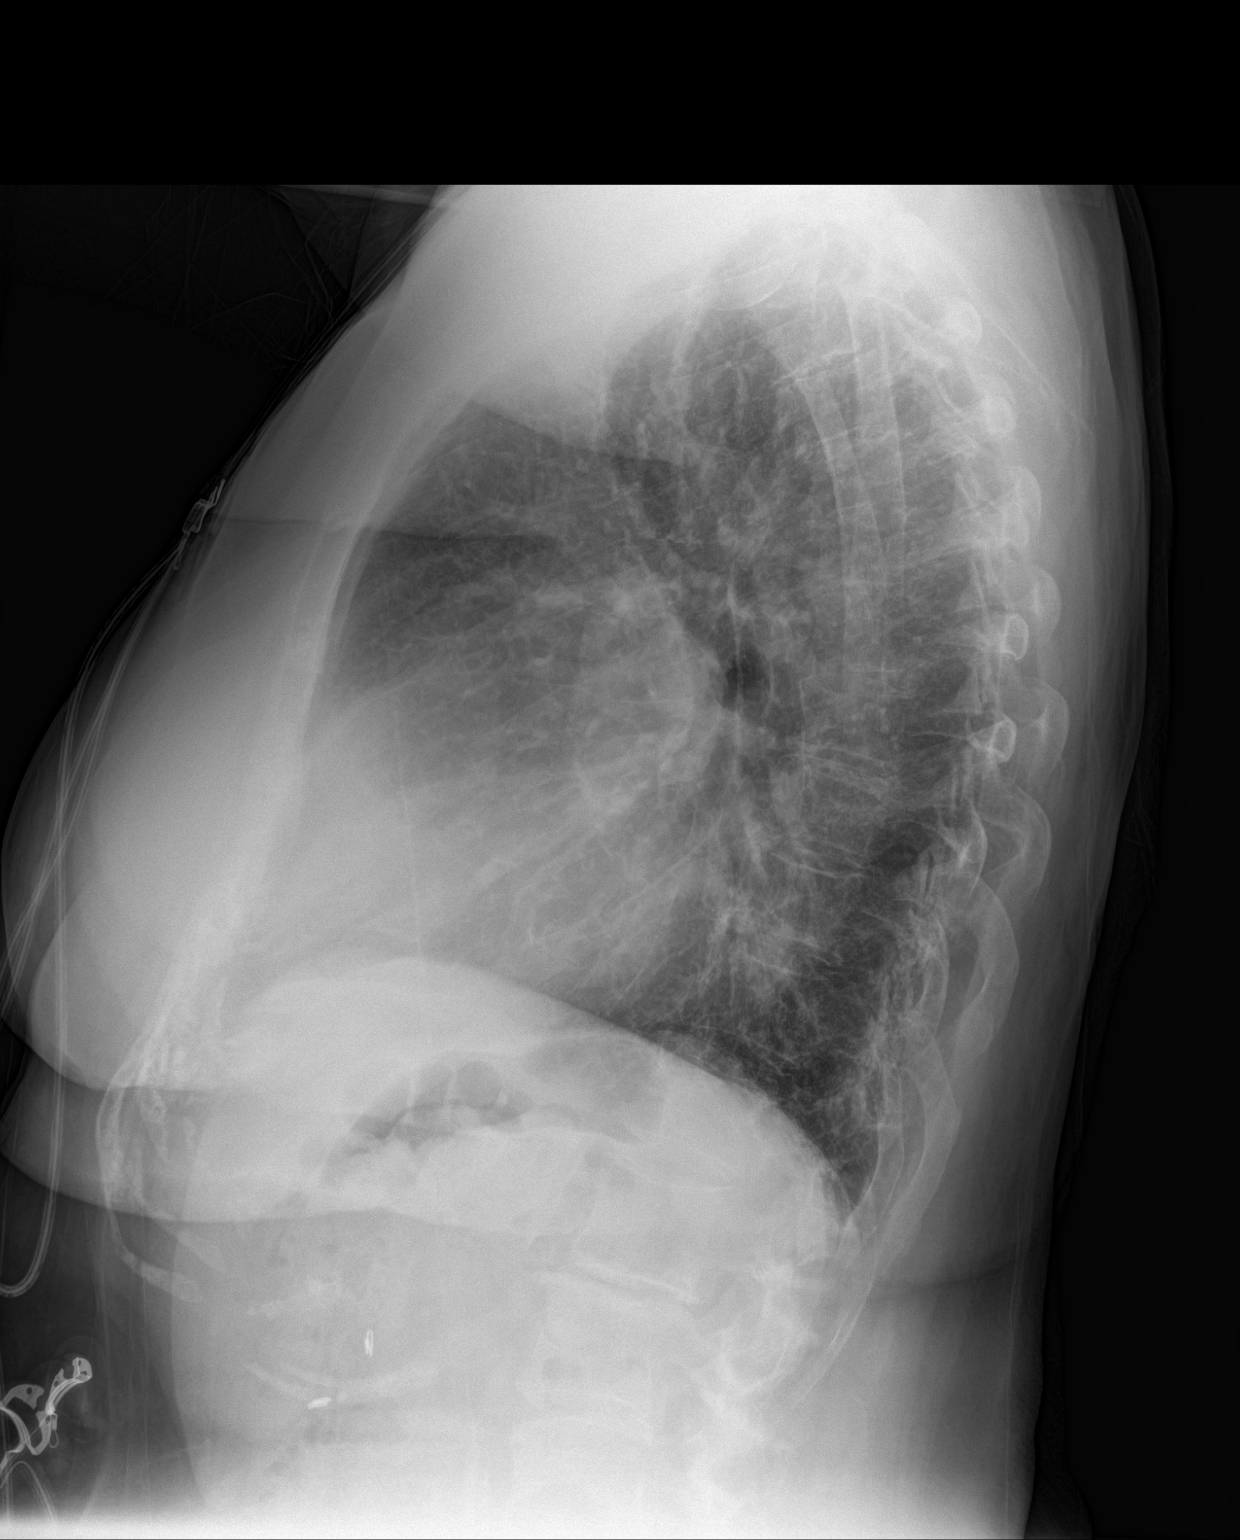

[2 of 2 positions shown; findings below may reference images not displayed]

FINDINGS: Cardiomegaly, moderate in degree, is unchanged. Pulmonary
vasculature is within normal limits. No evidence of active
congestive heart failure. Lungs are clear. No pleural effusions
seen. No pneumothorax.

Incidental note is made of a small opacity in the left costophrenic
angle region which is stable dating back to 6393, indicating
benignity, possibly nodular scarring.

Osseous and soft tissue structures about the chest are unremarkable.
Mild degenerative change again noted throughout the scoliotic
thoracolumbar spine.
IMPRESSION: Cardiomegaly, stable.

Stable chest x-ray. No evidence of acute cardiopulmonary abnormality

## 2017-11-03 NOTE — Telephone Encounter (Signed)
Please advise 

## 2017-11-03 NOTE — Telephone Encounter (Unsigned)
Copied from [place] [id number]. Topic: General - Other >> Nov 03, 2017  2:08 PM Neva Seat wrote: Pt wants most recent lab results mailed to her home address.

## 2017-11-04 ENCOUNTER — Ambulatory Visit (HOSPITAL_COMMUNITY): Admit: 2017-11-04 | Payer: Medicare Other | Admitting: Internal Medicine

## 2017-11-04 ENCOUNTER — Encounter (HOSPITAL_COMMUNITY): Payer: Self-pay

## 2017-11-04 ENCOUNTER — Other Ambulatory Visit: Payer: Self-pay

## 2017-11-04 SURGERY — ATRIAL FIBRILLATION ABLATION
Anesthesia: Monitor Anesthesia Care

## 2017-11-04 MED ORDER — APIXABAN 5 MG PO TABS: 5.0000 mg | ORAL_TABLET | Freq: Two times a day (BID) | ORAL | 3 refills | Status: DC

## 2017-11-04 NOTE — Telephone Encounter (Signed)
Labs have been mailed.

## 2017-11-04 NOTE — Telephone Encounter (Signed)
Please advise 

## 2017-11-19 DIAGNOSIS — M62838 Other muscle spasm: Secondary | ICD-10-CM | POA: Diagnosis not present

## 2017-11-22 ENCOUNTER — Ambulatory Visit (INDEPENDENT_AMBULATORY_CARE_PROVIDER_SITE_OTHER): Payer: Medicare Other

## 2017-11-22 ENCOUNTER — Encounter: Payer: Self-pay | Admitting: Internal Medicine

## 2017-11-22 ENCOUNTER — Ambulatory Visit (INDEPENDENT_AMBULATORY_CARE_PROVIDER_SITE_OTHER): Payer: Medicare Other | Admitting: Internal Medicine

## 2017-11-22 VITALS — BP 138/94 | HR 69 | Temp 97.8°F | Ht 64.5 in | Wt 159.5 lb

## 2017-11-22 DIAGNOSIS — I4819 Other persistent atrial fibrillation: Secondary | ICD-10-CM

## 2017-11-22 DIAGNOSIS — M48061 Spinal stenosis, lumbar region without neurogenic claudication: Secondary | ICD-10-CM | POA: Diagnosis not present

## 2017-11-22 DIAGNOSIS — I481 Persistent atrial fibrillation: Secondary | ICD-10-CM

## 2017-11-22 DIAGNOSIS — M25552 Pain in left hip: Secondary | ICD-10-CM

## 2017-11-22 MED ORDER — HYDROCODONE-ACETAMINOPHEN 5-325 MG PO TABS: ORAL_TABLET | ORAL | 0 refills | Status: DC

## 2017-11-22 NOTE — Progress Notes (Signed)
Chief Complaint  Patient presents with  . Hip Pain   Pt presents for f/u  1. C/o left hip pain [name] [name] went to Urgent care Saturday and they gave her Zabaflex 4 mg and [name] [name] took 1 pil and BP went to 63/41 and [name] [name] did not well but did not go to hospital. Pain is stabbing a radiates to left thigh side and posterior and at times down leg and to toe. [facility] dx'ed with "muscle spasm".   [name] [name] has been hurting worse qhs and having trouble getting out of bed and having to use a walker and [name] [name] normally does not walk with a walker. Due to hip pain gait is slowed and [name] [name] is not able to make it to the bathroom in time b/f [name] [name] urinates on herself.  2. H/o AF persistent [name] [name] reports her heart has been shocked x 7 and currently they are holding on ablation.    Review of Systems  Respiratory: Negative for shortness of breath.   Cardiovascular: Positive for palpitations. Negative for chest pain.       +AFib  Genitourinary:       +urinary incontinence   Musculoskeletal: Positive for joint pain. Negative for back pain.   Past Medical History:  Diagnosis Date  . Arthritis   . Atrial flutter ([place]) 02/2011   s/p cardioversion   . Cancer (HCC)    BREAST  . Chest pain    a. H/o cardiac cath x 2, last 2012 -->nl cors;  b. 12/2016 MV: EF 61%, small region of mild perfusion defect in the apical anteroseptal region c/w breast attenuation, no ischemia-->Low risk.  . Chronic kidney disease    acute renal failure secondary to dehydration which is now resolved  . Cystocele   . Diastolic dysfunction    a. 10/2015 Echo: EF 60-65%, no rwma, Gr1 DD, mild MR, mildly dil LA, nl RV fxn, PASP 32mmHg.  [name] GERD (gastroesophageal reflux disease)   . Headache(784.0)    chronic  . Hyperlipidemia   . Hypertension   . Knee fracture   . Light headedness    due to dehydration  . Mobitz type 2 second degree atrioventricular block    a. felt to be 2/2 amiodarone, resolved with decreased amiodarone dose  . Persistent atrial  fibrillation ([place])    a. status post multiple DCCV, most recently in 07/2013  . PONV (postoperative nausea and vomiting)    oxycodone and codiene cause N/V   . Pre-syncope   . Tachycardia induced cardiomyopathy ([place])   . Venous insufficiency   . Vertigo    Past Surgical History:  Procedure Laterality Date  . ABDOMINAL HYSTERECTOMY  1990  . APPENDECTOMY    . AUGMENTATION MAMMAPLASTY Bilateral 1986   implants  . AUGMENTATION MAMMAPLASTY  1990  . AUGMENTATION MAMMAPLASTY  2011  . CARDIAC CATHETERIZATION    . CARDIOVERSION     x 3  . CARDIOVERSION    . CARDIOVERSION N/A 02/07/2017   Procedure: CARDIOVERSION;  Surgeon: Wellington Hampshire, MD;  Location: [facility];  Service: Cardiovascular;  Laterality: N/A;  . CARDIOVERSION N/A 07/22/2017   Procedure: Cardioversion;  Surgeon: Minna Merritts, MD;  Location: [facility] OR[facility];  Service: Cardiovascular;  Laterality: N/A;  . CATARACT EXTRACTION W/PHACO Right 09/21/2016   Procedure: CATARACT EXTRACTION PHACO AND INTRAOCULAR LENS PLACEMENT ([place]);  Surgeon: Birder Robson, MD;  Location: [facility];  Service: Ophthalmology;  Laterality: Right;  [place] 44.1 AP% 16.5 CDE 7.30 Fluid Pack Lot #[id number]  .  CATARACT EXTRACTION W/PHACO Left 10/19/2016   Procedure: CATARACT EXTRACTION PHACO AND INTRAOCULAR LENS PLACEMENT (IOC);  Surgeon: Birder Robson, MD;  Location: [facility];  Service: Ophthalmology;  Laterality: Left;  [place] 53.7 AP% 19.5 CDE 10.45 Fluid pack lot # [id number]  . CHOLECYSTECTOMY    . COMBINED AUGMENTATION MAMMAPLASTY AND ABDOMINOPLASTY    . JOINT REPLACEMENT Left 06/04/2013   left knee  . KNEE ARTHROSCOPY Right 08/16/2016   Procedure: ARTHROSCOPY KNEE, tear posterior horn medial meniscus, tear anterior and posterior horns of lateral meniscus, chondromalacia of lateral compartment grade 3 patella and grade 4 medial;  Surgeon: Dereck Leep, MD;  Location: [facility] OR[facility];  Service: Orthopedics;  Laterality: Right;  . MASTECTOMY  1986   Bilateral with  silicone  breast implants, s/p saline replacements  . Multiple orthopedic procedures    . NOSE SURGERY    . TEE WITHOUT CARDIOVERSION N/A 09/26/2017   Procedure: TRANSESOPHAGEAL ECHOCARDIOGRAM (TEE);  Surgeon: Fay Records, MD;  Location: [facility] ENDOSCOPY;  Service: Cardiovascular;  Laterality: N/A;  . TOTAL KNEE ARTHROPLASTY Left    Family History  Problem Relation Age of Onset  . Heart disease Mother   . Cancer Father        stomach  . Heart disease Son        found at autopsy  . Cancer Sister        breast  . Breast cancer Sister   . Cancer Maternal Grandmother        breast  . Breast cancer Maternal Grandmother   . Cancer Sister        breast  . Breast cancer Sister   . Breast cancer Sister   . Cancer Sister        breast  . Diabetes Brother   . Ovarian cancer Neg Hx    Social History   Socioeconomic History  . Marital status: Married    Spouse name: Not on file  . Number of children: 1  . Years of education: Not on file  . Highest education level: Not on file  Social Needs  . Financial resource strain: Not on file  . Food insecurity - worry: Not on file  . Food insecurity - inability: Not on file  . Transportation needs - medical: Not on file  . Transportation needs - non-medical: Not on file  Occupational History    Employer: RETIRED  Tobacco Use  . Smoking status: Never Smoker  . Smokeless tobacco: Never Used  Substance and Sexual Activity  . Alcohol use: No  . Drug use: No  . Sexual activity: No    Birth control/protection: Surgical  Other Topics Concern  . Not on file  Social History Narrative   Married    Current Meds  Medication Sig  . acetaminophen (TYLENOL) 500 MG tablet Take 1,000 mg by mouth every 6 (six) hours as needed (pain).   [name] aluminum-magnesium hydroxide 200-200 MG/5ML suspension Take 20 mLs by mouth every 6 (six) hours as needed for indigestion.   [name] amLODipine (NORVASC) 2.5 MG tablet Take 2.5 mg by mouth 2 (two) times daily.  [name]  apixaban (ELIQUIS) 5 MG TABS tablet Take 1 tablet (5 mg total) 2 (two) times daily by mouth.  . cholecalciferol (VITAMIN D) 1000 units tablet Take 1,000 Units by mouth daily.  [name] HYDROcodone-acetaminophen (NORCO/VICODIN) 5-325 MG tablet Take 1 tablet by mouth every 6 (six) hours as needed for moderate pain.  . metoprolol tartrate (LOPRESSOR) 25 MG tablet Take 25 mg  by mouth 2 (two) times daily.   . pantoprazole (PROTONIX) 40 MG tablet Take 1 tablet (40 mg total) by mouth daily.  . pravastatin (PRAVACHOL) 20 MG tablet TAKE ONE (1) TABLET BY MOUTH EVERY DAY  . Probiotic Product (PROBIOTIC ADVANCED PO) Take 1 tablet by mouth daily.  . sodium chloride (OCEAN) 0.65 % SOLN nasal spray Place 2 sprays into both nostrils 2 (two) times daily as needed for congestion.    Allergies  Allergen Reactions  . Iodine Anaphylaxis    NO PROBLEMS WITH BETADINE  . Biaxin [Clarithromycin] Nausea And Vomiting  . Codeine Nausea And Vomiting  . Enalapril Other (See Comments)    unknown  . Fluocinonide Other (See Comments)    Tingling sensation in head and redness to scalp.  . Oxycodone Nausea And Vomiting  . Promethazine Other (See Comments)    Unknown   . Warfarin And Related   . Xarelto [Rivaroxaban] Other (See Comments)    Unknown   . Zanaflex [Tizanidine Hcl]     hypotension   Recent Results (from the past 2160 hour(s))  Basic metabolic panel     Status: Abnormal   Collection Time: 09/12/17 11:37 AM  Result Value Ref Range   Glucose 100 (H) 65 - 99 mg/dL   BUN 18 8 - 27 mg/dL   Creatinine, Ser 1.02 (H) 0.57 - 1.00 mg/dL   GFR calc non Af Amer 52 (L) >59 mL/min/1.73   GFR calc Af Amer 60 >59 mL/min/1.73   BUN/Creatinine Ratio 18 12 - 28   Sodium 140 134 - 144 mmol/L   Potassium 4.6 3.5 - 5.2 mmol/L   Chloride 102 96 - 106 mmol/L   CO2 24 20 - 29 mmol/L   Calcium 9.4 8.7 - 10.3 mg/dL  CBC with Differential     Status: None   Collection Time: 09/12/17 11:37 AM  Result Value Ref Range   WBC 7.2  3.4 - 10.8 x10E3/uL   RBC 4.16 3.77 - 5.28 x10E6/uL   Hemoglobin 12.7 11.1 - 15.9 g/dL   Hematocrit 40.1 34.0 - 46.6 %   MCV 96 79 - 97 fL   MCH 30.5 26.6 - 33.0 pg   MCHC 31.7 31.5 - 35.7 g/dL   RDW 14.1 12.3 - 15.4 %   Platelets 270 150 - 379 x10E3/uL   Neutrophils 81 Not Estab. %   Lymphs 13 Not Estab. %   Monocytes 5 Not Estab. %   Eos 1 Not Estab. %   Basos 0 Not Estab. %   Neutrophils Absolute 5.8 1.4 - 7.0 x10E3/uL   Lymphocytes Absolute 1.0 0.7 - 3.1 x10E3/uL   Monocytes Absolute 0.4 0.1 - 0.9 x10E3/uL   EOS (ABSOLUTE) 0.1 0.0 - 0.4 x10E3/uL   Basophils Absolute 0.0 0.0 - 0.2 x10E3/uL   Immature Granulocytes 0 Not Estab. %   Immature Grans (Abs) 0.0 0.0 - 0.1 x10E3/uL  POCT urinalysis dipstick     Status: None   Collection Time: 10/11/17 12:47 PM  Result Value Ref Range   Color, UA Yellow    Clarity, UA clear    Glucose, UA negative    Bilirubin, UA negative    Ketones, UA negative    Spec Grav, UA 1.010 1.010 - 1.025   Blood, UA negative    pH, UA 7.5 5.0 - 8.0   Protein, UA negative    Urobilinogen, UA 0.2 0.2 or 1.0 E.U./dL   Nitrite, UA negative    Leukocytes, UA Negative Negative  Comprehensive metabolic panel     Status: Abnormal   Collection Time: 10/11/17  1:11 PM  Result Value Ref Range   Sodium 137 135 - 145 mEq/L   Potassium 4.4 3.5 - 5.1 mEq/L   Chloride 101 96 - 112 mEq/L   CO2 31 19 - 32 mEq/L   Glucose, Bld 76 70 - 99 mg/dL   BUN 18 6 - 23 mg/dL   Creatinine, Ser 0.98 0.40 - 1.20 mg/dL   Total Bilirubin 0.8 0.2 - 1.2 mg/dL   Alkaline Phosphatase 54 39 - 117 U/L   AST 19 0 - 37 U/L   ALT 18 0 - 35 U/L   Total Protein 7.2 6.0 - 8.3 g/dL   Albumin 4.2 3.5 - 5.2 g/dL   Calcium 9.7 8.4 - 10.5 mg/dL   GFR 58.08 (L) >60.00 mL/min   Objective  Body mass index is 26.96 kg/m. Wt Readings from Last 3 Encounters:  11/22/17 159 lb 8 oz (72.3 kg)  10/19/17 158 lb (71.7 kg)  10/12/17 157 lb (71.2 kg)   Temp Readings from Last 3 Encounters:    11/22/17 97.8 F (36.6 C) (Oral)  10/12/17 97.9 F (36.6 C) (Oral)  10/11/17 (!) 97.5 F (36.4 C) (Oral)   BP Readings from Last 3 Encounters:  11/22/17 (!) 138/94  10/19/17 128/84  10/12/17 (!) 145/83   Pulse Readings from Last 3 Encounters:  11/22/17 69  10/19/17 75  10/12/17 67   Pulse oximetry on room air is 98% Physical Exam  Constitutional: [name] [name] is oriented to person, place, and time and well-developed, well-nourished, and in no distress. Vital signs are normal.  HENT:  Head: Normocephalic and atraumatic.  Mouth/Throat: Oropharynx is clear and moist and mucous membranes are normal.  Cardiovascular: Normal rate. An irregular rhythm present.  In AF  Pulmonary/Chest: Effort normal and breath sounds normal.  Musculoskeletal: [name] [name] exhibits tenderness.       Left hip: [name] [name] exhibits tenderness.       Legs: Neurological: [name] [name] is alert and oriented to person, place, and time.  Slowed gait   Skin: Skin is warm, dry and intact.  Psychiatric: Mood, memory, affect and judgment normal.  Nursing note and vitals reviewed.  Assessment   1. Left hip pain  2. Afib, persistent rate controlled   Plan  1.  Xray left hip and low back  Refer back to [place], Dr. Marry Guan  Prn Norco 1/2 tab qd to bid prn max dose Tylenol 3000 mg/day  Xray low back and left hip today  AVOID Zanaflex in future caused hypotension   2. F/u cardiology Provider: Dr. Olivia Mackie McLean-Scocuzza

## 2017-11-22 NOTE — Patient Instructions (Addendum)
We will refer you to ortho Rogers Blocker PA Please do Xrays today  We will give you medication for pain as needed  Maximum Tylenol dose is 3000 mg total in 1 day  Follow up in 2-3 months sooner if needed    Hip Pain The hip is the joint between the upper legs and the lower pelvis. The bones, cartilage, tendons, and muscles of your hip joint support your body and allow you to move around. Hip pain can range from a minor ache to severe pain in one or both of your hips. The pain may be felt on the inside of the hip joint near the groin, or the outside near the buttocks and upper thigh. You may also have swelling or stiffness. Follow these instructions at home: Managing pain, stiffness, and swelling  If directed, apply ice to the injured area. ? Put ice in a plastic bag. ? Place a towel between your skin and the bag. ? Leave the ice on for 20 minutes, 2-3 times a day  Sleep with a pillow between your legs on your most comfortable side.  Avoid any activities that cause pain. General instructions  Take over-the-counter and prescription medicines only as told by your health care provider.  Do any exercises as told by your health care provider.  Record the following: ? How often you have hip pain. ? The location of your pain. ? What the pain feels like. ? What makes the pain worse.  Keep all follow-up visits as told by your health care provider. This is important. Contact a health care provider if:  You cannot put weight on your leg.  Your pain or swelling continues or gets worse after one week.  It gets harder to walk.  You have a fever. Get help right away if:  You fall.  You have a sudden increase in pain and swelling in your hip.  Your hip is red or swollen or very tender to touch. Summary  Hip pain can range from a minor ache to severe pain in one or both of your hips.  The pain may be felt on the inside of the hip joint near the groin, or the outside near the buttocks and  upper thigh.  Avoid any activities that cause pain.  Record how often you have hip pain, the location of the pain, what makes it worse and what it feels like. This information is not intended to replace advice given to you by your health care provider. Make sure you discuss any questions you have with your health care provider. Document Released: 05/26/2010 Document Revised: 11/08/2016 Document Reviewed: 11/08/2016 Elsevier Interactive Patient Education  2017 [employer].

## 2017-11-22 NOTE — Progress Notes (Signed)
.  ho

## 2017-11-24 ENCOUNTER — Telehealth: Payer: Self-pay

## 2017-11-24 ENCOUNTER — Telehealth: Payer: Self-pay | Admitting: Internal Medicine

## 2017-11-24 DIAGNOSIS — M9903 Segmental and somatic dysfunction of lumbar region: Secondary | ICD-10-CM | POA: Diagnosis not present

## 2017-11-24 DIAGNOSIS — M5137 Other intervertebral disc degeneration, lumbosacral region: Secondary | ICD-10-CM | POA: Diagnosis not present

## 2017-11-24 DIAGNOSIS — M4608 Spinal enthesopathy, sacral and sacrococcygeal region: Secondary | ICD-10-CM | POA: Diagnosis not present

## 2017-11-24 DIAGNOSIS — M9904 Segmental and somatic dysfunction of sacral region: Secondary | ICD-10-CM | POA: Diagnosis not present

## 2017-11-24 NOTE — Telephone Encounter (Signed)
Pt needs a disc of her last xrays of her back ASAP Please advise pt when complete

## 2017-11-24 NOTE — Telephone Encounter (Signed)
Copied from [place] [id number]. Topic: Referral - Request >> Nov 24, 2017  9:19 AM Ivar Drape wrote: Reason for CRM: The patient would like a return call concerning her referrals. She didn't want to discuss it with the [place].

## 2017-11-24 NOTE — Telephone Encounter (Signed)
Copied from [place] [phone]. Topic: Referral - Request >> Nov 24, 2017  9:19 AM Ivar Drape wrote: Reason for CRM: The patient would like a return call concerning her referrals. She didn't want to discuss it with the [place].  >> Nov 24, 2017 10:21 AM Boyd Kerbs wrote: Patient wants to pick up copy of x-rays and report and take to a doctor she wants to see. Dr. Earle Gell.

## 2017-11-24 NOTE — Telephone Encounter (Signed)
Copied from [place] [phone]. Topic: Referral - Request >> Nov 24, 2017  9:19 AM Ivar Drape wrote: Reason for CRM: The patient would like a return call concerning her referrals. She didn't want to discuss it with the [place].

## 2017-11-24 NOTE — Telephone Encounter (Signed)
Called pt and notified copy of CD was ready.

## 2017-11-25 DIAGNOSIS — M9903 Segmental and somatic dysfunction of lumbar region: Secondary | ICD-10-CM | POA: Diagnosis not present

## 2017-11-25 DIAGNOSIS — M9904 Segmental and somatic dysfunction of sacral region: Secondary | ICD-10-CM | POA: Diagnosis not present

## 2017-11-25 DIAGNOSIS — M5137 Other intervertebral disc degeneration, lumbosacral region: Secondary | ICD-10-CM | POA: Diagnosis not present

## 2017-11-25 DIAGNOSIS — M4608 Spinal enthesopathy, sacral and sacrococcygeal region: Secondary | ICD-10-CM | POA: Diagnosis not present

## 2017-11-30 DIAGNOSIS — M9903 Segmental and somatic dysfunction of lumbar region: Secondary | ICD-10-CM | POA: Diagnosis not present

## 2017-11-30 DIAGNOSIS — M5137 Other intervertebral disc degeneration, lumbosacral region: Secondary | ICD-10-CM | POA: Diagnosis not present

## 2017-11-30 DIAGNOSIS — M4608 Spinal enthesopathy, sacral and sacrococcygeal region: Secondary | ICD-10-CM | POA: Diagnosis not present

## 2017-11-30 DIAGNOSIS — M9904 Segmental and somatic dysfunction of sacral region: Secondary | ICD-10-CM | POA: Diagnosis not present

## 2017-12-01 DIAGNOSIS — M9903 Segmental and somatic dysfunction of lumbar region: Secondary | ICD-10-CM | POA: Diagnosis not present

## 2017-12-01 DIAGNOSIS — M5137 Other intervertebral disc degeneration, lumbosacral region: Secondary | ICD-10-CM | POA: Diagnosis not present

## 2017-12-01 DIAGNOSIS — M4608 Spinal enthesopathy, sacral and sacrococcygeal region: Secondary | ICD-10-CM | POA: Diagnosis not present

## 2017-12-01 DIAGNOSIS — M9904 Segmental and somatic dysfunction of sacral region: Secondary | ICD-10-CM | POA: Diagnosis not present

## 2017-12-05 DIAGNOSIS — M4608 Spinal enthesopathy, sacral and sacrococcygeal region: Secondary | ICD-10-CM | POA: Diagnosis not present

## 2017-12-05 DIAGNOSIS — M5137 Other intervertebral disc degeneration, lumbosacral region: Secondary | ICD-10-CM | POA: Diagnosis not present

## 2017-12-05 DIAGNOSIS — M9903 Segmental and somatic dysfunction of lumbar region: Secondary | ICD-10-CM | POA: Diagnosis not present

## 2017-12-05 DIAGNOSIS — M9904 Segmental and somatic dysfunction of sacral region: Secondary | ICD-10-CM | POA: Diagnosis not present

## 2017-12-06 DIAGNOSIS — M9904 Segmental and somatic dysfunction of sacral region: Secondary | ICD-10-CM | POA: Diagnosis not present

## 2017-12-06 DIAGNOSIS — M5137 Other intervertebral disc degeneration, lumbosacral region: Secondary | ICD-10-CM | POA: Diagnosis not present

## 2017-12-06 DIAGNOSIS — M4608 Spinal enthesopathy, sacral and sacrococcygeal region: Secondary | ICD-10-CM | POA: Diagnosis not present

## 2017-12-06 DIAGNOSIS — M9903 Segmental and somatic dysfunction of lumbar region: Secondary | ICD-10-CM | POA: Diagnosis not present

## 2017-12-09 ENCOUNTER — Encounter: Payer: Self-pay | Admitting: Cardiovascular Disease

## 2017-12-09 ENCOUNTER — Ambulatory Visit: Payer: Medicare Other | Admitting: Cardiovascular Disease

## 2017-12-09 VITALS — BP 120/80 | HR 79 | Ht 64.5 in | Wt 159.8 lb

## 2017-12-09 DIAGNOSIS — I482 Chronic atrial fibrillation, unspecified: Secondary | ICD-10-CM

## 2017-12-09 DIAGNOSIS — I10 Essential (primary) hypertension: Secondary | ICD-10-CM | POA: Diagnosis not present

## 2017-12-09 NOTE — Progress Notes (Signed)
Cardiology Office Note   Date:  12/09/2017   ID:  CITLALLY CAPTAIN, DOB August 17, 1936, MRN [id number]  PCP:  [name], MD  Cardiologist:   [name], MD   Chief Complaint  Patient presents with  . other    4 month follow up.       History of Present Illness: [name] [name] is a 81 y.o. female who presents for a followup visit regarding permanent atrial fibrillation.   She has known history of  atrial fibrillation/flutter status post multiple cardioversions in the past (total of 7). 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 She had cardiac catheterization twice in the past without significant coronary artery disease.  Most recent cardioversion was in Feb of 2018.  Most recent echocardiogram in September 2018 showed an EF of 50-55%, mild mitral regurgitation and moderate biatrial enlargement. The patient was seen by Dr. Rayann Heman consider A. fib ablation but after discussion, she elected for rate control. She has been doing reasonably well with metoprolol with minimal palpitations.  No chest pain or shortness of breath.  Past Medical History:  Diagnosis Date  . Arthritis   . Atrial flutter ([place]) 02/2011   s/p cardioversion   . Cancer (HCC)    BREAST  . Chest pain    a. H/o cardiac cath x 2, last 2012 -->nl cors;  b. 12/2016 MV: EF 61%, small region of mild perfusion defect in the apical anteroseptal region c/w breast attenuation, no ischemia-->Low risk.  . Chronic kidney disease    acute renal failure secondary to dehydration which is now resolved  . Cystocele   . Diastolic dysfunction    a. 10/2015 Echo: EF 60-65%, no rwma, Gr1 DD, mild MR, mildly dil LA, nl RV fxn, PASP 68mmHg.  [name] GERD (gastroesophageal reflux disease)   . Headache(784.0)    chronic  . Hyperlipidemia   . Hypertension   . Knee fracture   . Light headedness    due to dehydration  . Mobitz type 2 second degree atrioventricular block    a. felt to be 2/2  amiodarone, resolved with decreased amiodarone dose  . Persistent atrial fibrillation ([place])    a. status post multiple DCCV, most recently in 07/2013  . PONV (postoperative nausea and vomiting)    oxycodone and codiene cause N/V   . Pre-syncope   . 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 Venous insufficiency   . Vertigo     Past Surgical History:  Procedure Laterality Date  . ABDOMINAL HYSTERECTOMY  1990  . APPENDECTOMY    . AUGMENTATION MAMMAPLASTY Bilateral 1986   implants  . AUGMENTATION MAMMAPLASTY  1990  . AUGMENTATION MAMMAPLASTY  2011  . CARDIAC CATHETERIZATION    . CARDIOVERSION     x 3  . CARDIOVERSION    . CARDIOVERSION N/A 02/07/2017   Procedure: CARDIOVERSION;  Surgeon: Wellington Hampshire, MD;  Location: [facility];  Service: Cardiovascular;  Laterality: N/A;  . CARDIOVERSION N/A 07/22/2017   Procedure: Cardioversion;  Surgeon: Minna Merritts, MD;  Location: [facility];  Service: Cardiovascular;  Laterality: N/A;  . CATARACT EXTRACTION W/PHACO Right 09/21/2016   Procedure: CATARACT EXTRACTION PHACO AND INTRAOCULAR LENS PLACEMENT ([place]);  Surgeon: Birder Robson, MD;  Location: [facility];  Service: Ophthalmology;  Laterality: Right;  [place] 44.1 AP% 16.5 CDE 7.30 Fluid Pack Lot #[id number]  . CATARACT EXTRACTION W/PHACO Left 10/19/2016   Procedure: CATARACT EXTRACTION PHACO AND INTRAOCULAR LENS PLACEMENT (IOC);  Surgeon: Gwyndolyn Saxon  Porfilio, MD;  Location: [facility];  Service: Ophthalmology;  Laterality: Left;  [place] 53.7 AP% 19.5 CDE 10.45 Fluid pack lot # [id number]  . CHOLECYSTECTOMY    . COMBINED AUGMENTATION MAMMAPLASTY AND ABDOMINOPLASTY    . JOINT REPLACEMENT Left 06/04/2013   left knee  . KNEE ARTHROSCOPY Right 08/16/2016   Procedure: ARTHROSCOPY KNEE, tear posterior horn medial meniscus, tear anterior and posterior horns of lateral meniscus, chondromalacia of lateral compartment grade 3 patella and grade 4 medial;  Surgeon: Dereck Leep, MD;  Location: [facility];  Service:  Orthopedics;  Laterality: Right;  . MASTECTOMY  1986   Bilateral with silicone  breast implants, s/p saline replacements  . Multiple orthopedic procedures    . NOSE SURGERY    . TEE WITHOUT CARDIOVERSION N/A 09/26/2017   Procedure: TRANSESOPHAGEAL ECHOCARDIOGRAM (TEE);  Surgeon: Fay Records, MD;  Location: [facility];  Service: Cardiovascular;  Laterality: N/A;  . TOTAL KNEE ARTHROPLASTY Left      Current Outpatient Medications  Medication Sig Dispense Refill  . acetaminophen (TYLENOL) 500 MG tablet Take 1,000 mg by mouth every 6 (six) hours as needed (pain).     [name] aluminum-magnesium hydroxide 200-200 MG/5ML suspension Take 20 mLs by mouth every 6 (six) hours as needed for indigestion.     [name] amLODipine (NORVASC) 2.5 MG tablet Take 2.5 mg by mouth 2 (two) times daily.    [name] apixaban (ELIQUIS) 5 MG TABS tablet Take 1 tablet (5 mg total) 2 (two) times daily by mouth. 60 tablet 3  . cholecalciferol (VITAMIN D) 1000 units tablet Take 1,000 Units by mouth daily.    [name] HYDROcodone-acetaminophen (NORCO/VICODIN) 5-325 MG tablet Take 1 tablet by mouth every 6 (six) hours as needed for moderate pain. 15 tablet 0  . metoprolol tartrate (LOPRESSOR) 25 MG tablet Take 25 mg by mouth 2 (two) times daily.     . pantoprazole (PROTONIX) 40 MG tablet Take 1 tablet (40 mg total) by mouth daily. 30 tablet 11  . pravastatin (PRAVACHOL) 20 MG tablet TAKE ONE (1) TABLET BY MOUTH EVERY DAY 90 tablet 3  . Probiotic Product (PROBIOTIC ADVANCED PO) Take 1 tablet by mouth daily.    . sodium chloride (OCEAN) 0.65 % SOLN nasal spray Place 2 sprays into both nostrils 2 (two) times daily as needed for congestion.      No current facility-administered medications for this visit.     Allergies:   Iodine; Biaxin [clarithromycin]; Codeine; Enalapril; Fluocinonide; Oxycodone; Promethazine; Warfarin and related; Xarelto [rivaroxaban]; and Zanaflex [tizanidine hcl]    Social History:  The patient  reports that  has never  smoked. she has never used smokeless tobacco. She reports that she does not drink alcohol or use drugs.   Family History:  The patient's family history includes Breast cancer in her maternal grandmother, sister, sister, and sister; Cancer in her father, maternal grandmother, sister, sister, and sister; Diabetes in her brother; Heart disease in her mother and son.    ROS:  Please see the history of present illness.   Otherwise, review of systems are positive for none.   All other systems are reviewed and negative.    PHYSICAL EXAM: VS:  BP 120/80 (BP Location: Left Arm, Patient Position: Sitting, Cuff Size: Normal)   Pulse 79   Ht 5' 4.5" (1.638 m)   Wt 159 lb 12 oz (72.5 kg)   BMI 27.00 kg/m  , BMI Body mass index is 27 kg/m. GEN: Well nourished, well developed, in no  acute distress  HEENT: normal  Neck: no JVD, carotid bruits, or masses Cardiac: Irregularly irregular; no murmurs, rubs, or gallops,no edema  Respiratory:  clear to auscultation bilaterally, normal work of breathing GI: soft, nontender, nondistended, + BS MS: no deformity or atrophy  Skin: warm and dry, no rash Neuro:  Strength and sensation are intact Psych: euthymic mood, full affect   EKG:  EKG is ordered today. The ekg ordered today demonstrates atrial fibrillation with ventricular rate of 79 bpm with right bundle branch block and inferolateral T wave changes.  Recent Labs: 07/18/2017: Magnesium 2.5 07/21/2017: TSH 3.500 09/12/2017: Hemoglobin 12.7; Platelets 270 10/11/2017: ALT 18; BUN 18; Creatinine, Ser 0.98; Potassium 4.4; Sodium 137    Lipid Panel    Component Value Date/Time   CHOL 147 07/22/2017 1332   TRIG 51 07/22/2017 1332   HDL 63 07/22/2017 1332   CHOLHDL 2.3 07/22/2017 1332   VLDL 10 07/22/2017 1332   LDLCALC 74 07/22/2017 1332      Wt Readings from Last 3 Encounters:  12/09/17 159 lb 12 oz (72.5 kg)  11/22/17 159 lb 8 oz (72.3 kg)  10/19/17 158 lb (71.7 kg)       ASSESSMENT AND  PLAN:  1.  Permanent atrial fibrillation: Ventricular rate is reasonably controlled on current dose of metoprolol 25 mg twice daily.  She is tolerating anticoagulation side effects.  2. Essential hypertension: His blood pressure is controlled on metoprolol and amlodipine.  3. History of tachycardia-induced cardiomyopathy. Most recent ejection fraction was low normal at 50-55%.  4. Left carotid calcifications: Carotid Doppler in August of this year showed mild nonobstructive disease.  Disposition:   FU with me in 6 months  Signed,  [name], MD  12/09/2017 11:22 AM    [place]

## 2017-12-09 NOTE — Patient Instructions (Signed)
Medication Instructions: Continue same medications.   Labwork: None.   Procedures/Testing: None.   Follow-Up: 6 months with Dr. Onesty Clair.   Any Additional Special Instructions Will Be Listed Below (If Applicable).     If you need a refill on your cardiac medications before your next appointment, please call your pharmacy.   

## 2017-12-15 DIAGNOSIS — M4608 Spinal enthesopathy, sacral and sacrococcygeal region: Secondary | ICD-10-CM | POA: Diagnosis not present

## 2017-12-15 DIAGNOSIS — M9903 Segmental and somatic dysfunction of lumbar region: Secondary | ICD-10-CM | POA: Diagnosis not present

## 2017-12-15 DIAGNOSIS — M5137 Other intervertebral disc degeneration, lumbosacral region: Secondary | ICD-10-CM | POA: Diagnosis not present

## 2017-12-15 DIAGNOSIS — M9904 Segmental and somatic dysfunction of sacral region: Secondary | ICD-10-CM | POA: Diagnosis not present

## 2017-12-22 DIAGNOSIS — M9904 Segmental and somatic dysfunction of sacral region: Secondary | ICD-10-CM | POA: Diagnosis not present

## 2017-12-22 DIAGNOSIS — M9903 Segmental and somatic dysfunction of lumbar region: Secondary | ICD-10-CM | POA: Diagnosis not present

## 2017-12-22 DIAGNOSIS — M5137 Other intervertebral disc degeneration, lumbosacral region: Secondary | ICD-10-CM | POA: Diagnosis not present

## 2017-12-22 DIAGNOSIS — M4608 Spinal enthesopathy, sacral and sacrococcygeal region: Secondary | ICD-10-CM | POA: Diagnosis not present

## 2017-12-23 ENCOUNTER — Telehealth: Payer: Self-pay | Admitting: Internal Medicine

## 2017-12-23 DIAGNOSIS — N2581 Secondary hyperparathyroidism of renal origin: Secondary | ICD-10-CM | POA: Diagnosis not present

## 2017-12-23 DIAGNOSIS — N183 Chronic kidney disease, stage 3 (moderate): Secondary | ICD-10-CM | POA: Diagnosis not present

## 2017-12-23 DIAGNOSIS — E7800 Pure hypercholesterolemia, unspecified: Secondary | ICD-10-CM | POA: Diagnosis not present

## 2017-12-23 DIAGNOSIS — I10 Essential (primary) hypertension: Secondary | ICD-10-CM | POA: Diagnosis not present

## 2017-12-23 NOTE — Telephone Encounter (Signed)
Returned the call to the patient. She stated that her blood pressure fluctuates anywhere from systolic 17'C to the 944'H. The patient stated that she decides whether or not she will take her Amlodipine 2.5 and Metoprolol 25 mg based on how she is feeling and how low her blood pressure is at that time. If her blood pressure is systolically 675 or below she will hold her Amlodipine. If her heart rate is below 70, she will hold her Metoprolol. She stated that it varies daily and there is not a definite way to say how much of the medication she actually takes. The patient has been educated that this could be why her blood pressure varies and why she feels weak because she takes her medication the way that she does.  She would like to discuss her medication at an appointment. An appointment has been made for 1/15 with Dr. Fletcher Anon.

## 2017-12-23 NOTE — Telephone Encounter (Signed)
Follow Up   Patient returning [name] [name] phone call

## 2017-12-23 NOTE — Telephone Encounter (Signed)
Pt c/o BP issue: STAT if pt c/o blurred vision, one-sided weakness or slurred speech  1. What are your last 5 BP readings?  95/58     2. Are you having any other symptoms (ex. Dizziness, headache, blurred vision, passed out)? Lightheaded   3. What is your BP issue?  Low and high and she feels weak

## 2017-12-23 NOTE — Telephone Encounter (Signed)
Left message to call back  

## 2017-12-25 NOTE — Telephone Encounter (Signed)
Ok thanks 

## 2017-12-26 ENCOUNTER — Telehealth: Payer: Self-pay

## 2017-12-26 NOTE — Telephone Encounter (Signed)
Copied from [place] [phone]. Topic: Inquiry >> Dec 26, 2017  3:45 PM Oliver Pila B wrote: Reason for CRM: pt called about the no show she is being charged for, on 10.29.18. Pt is needing a phone call to discuss the matter

## 2017-12-27 DIAGNOSIS — M25562 Pain in left knee: Secondary | ICD-10-CM | POA: Diagnosis not present

## 2017-12-27 DIAGNOSIS — M25561 Pain in right knee: Secondary | ICD-10-CM | POA: Diagnosis not present

## 2017-12-27 DIAGNOSIS — M1711 Unilateral primary osteoarthritis, right knee: Secondary | ICD-10-CM | POA: Diagnosis not present

## 2017-12-27 NOTE — Telephone Encounter (Signed)
Please advise. Thanks.  

## 2017-12-28 NOTE — Telephone Encounter (Signed)
Spoke with the patient , No show fee will be removed.

## 2017-12-29 DIAGNOSIS — M5137 Other intervertebral disc degeneration, lumbosacral region: Secondary | ICD-10-CM | POA: Diagnosis not present

## 2017-12-29 DIAGNOSIS — M4608 Spinal enthesopathy, sacral and sacrococcygeal region: Secondary | ICD-10-CM | POA: Diagnosis not present

## 2017-12-29 DIAGNOSIS — M9903 Segmental and somatic dysfunction of lumbar region: Secondary | ICD-10-CM | POA: Diagnosis not present

## 2017-12-29 DIAGNOSIS — M9904 Segmental and somatic dysfunction of sacral region: Secondary | ICD-10-CM | POA: Diagnosis not present

## 2018-01-03 ENCOUNTER — Ambulatory Visit: Payer: Medicare Other | Admitting: Cardiovascular Disease

## 2018-01-07 ENCOUNTER — Other Ambulatory Visit: Payer: Self-pay | Admitting: Internal Medicine

## 2018-01-10 ENCOUNTER — Other Ambulatory Visit: Payer: Self-pay

## 2018-01-10 MED ORDER — APIXABAN 5 MG PO TABS: 5.0000 mg | ORAL_TABLET | Freq: Two times a day (BID) | ORAL | 3 refills | Status: DC

## 2018-01-11 ENCOUNTER — Other Ambulatory Visit: Payer: Self-pay

## 2018-01-11 MED ORDER — METOPROLOL TARTRATE 25 MG PO TABS: 25.0000 mg | ORAL_TABLET | Freq: Two times a day (BID) | ORAL | 3 refills | Status: DC

## 2018-01-11 MED ORDER — AMLODIPINE BESYLATE 2.5 MG PO TABS: 2.5000 mg | ORAL_TABLET | Freq: Two times a day (BID) | ORAL | 3 refills | Status: DC

## 2018-01-13 ENCOUNTER — Telehealth: Payer: Self-pay | Admitting: Cardiovascular Disease

## 2018-01-13 ENCOUNTER — Other Ambulatory Visit: Payer: Self-pay

## 2018-01-13 MED ORDER — APIXABAN 5 MG PO TABS: 5.0000 mg | ORAL_TABLET | Freq: Two times a day (BID) | ORAL | 3 refills | Status: DC

## 2018-01-13 NOTE — Telephone Encounter (Signed)
[place] PAF form for eliquis to [phone]

## 2018-01-26 ENCOUNTER — Telehealth: Payer: Self-pay | Admitting: Cardiovascular Disease

## 2018-01-26 NOTE — Telephone Encounter (Signed)
S/w Eliquis patient assistance. Pt will need to meet out of pocket household expense of $649.48 before patient assistance may be considered. A letter was mailed to the patient and phone call initiated to inform.

## 2018-02-01 ENCOUNTER — Ambulatory Visit: Payer: Medicare Other | Admitting: Nurse Practitioner

## 2018-02-01 ENCOUNTER — Encounter: Payer: Self-pay | Admitting: Nurse Practitioner

## 2018-02-01 VITALS — BP 130/80 | HR 86 | Ht 64.5 in | Wt 163.5 lb

## 2018-02-01 DIAGNOSIS — E782 Mixed hyperlipidemia: Secondary | ICD-10-CM | POA: Diagnosis not present

## 2018-02-01 DIAGNOSIS — I482 Chronic atrial fibrillation: Secondary | ICD-10-CM | POA: Diagnosis not present

## 2018-02-01 DIAGNOSIS — I43 Cardiomyopathy in diseases classified elsewhere: Secondary | ICD-10-CM | POA: Diagnosis not present

## 2018-02-01 DIAGNOSIS — I10 Essential (primary) hypertension: Secondary | ICD-10-CM | POA: Diagnosis not present

## 2018-02-01 DIAGNOSIS — R000 Tachycardia, unspecified: Secondary | ICD-10-CM

## 2018-02-01 DIAGNOSIS — I4821 Permanent atrial fibrillation: Secondary | ICD-10-CM

## 2018-02-01 MED ORDER — METOPROLOL TARTRATE 50 MG PO TABS: 50.0000 mg | ORAL_TABLET | Freq: Two times a day (BID) | ORAL | 6 refills | Status: DC

## 2018-02-01 NOTE — Patient Instructions (Signed)
Medication Instructions: - Your physician has recommended you make the following change in your medication:  1) STOP novasc (amlodipine) 2) INCREASE lopressor (metoprolol tartate) to 50 mg- take 1 tablet by mouth TWICE daily  Labwork: - none ordered  Procedures/Testing: - .none ordered  Follow-Up: - Your physician recommends that you schedule a follow-up appointment in: 1 month with Dr. Fletcher Anon.    Any Additional Special Instructions Will Be Listed Below (If Applicable).     If you need a refill on your cardiac medications before your next appointment, please call your pharmacy.

## 2018-02-01 NOTE — Progress Notes (Signed)
Office Visit    Patient Name: [name] [name] Date of Encounter: 02/01/2018  Primary Care Provider:  [name], MD Primary Cardiologist:  [name], MD  Chief Complaint    82 year old female with a history of atrial fibrillation and flutter, hypertension, hyperlipidemia, and  remote tachycardia induced cardiomyopathy who presents for follow-up related to permanent atrial fibrillation and rate control.  Past Medical History    Past Medical History:  Diagnosis Date  . Arthritis   . Atrial flutter ([place]) 02/2011   s/p cardioversion   . Cancer (HCC)    BREAST  . Chest pain    a. H/o cardiac cath x 2, last 2012 -->nl cors;  b. 12/2016 MV: EF 61%, small region of mild perfusion defect in the apical anteroseptal region c/w breast attenuation, no ischemia-->Low risk.  . CKD (chronic kidney disease), stage III ([place])   . Cystocele   . GERD (gastroesophageal reflux disease)   . Headache(784.0)    chronic  . Hyperlipidemia   . Hypertension   . Knee fracture   . Mobitz type 2 second degree atrioventricular block    a. felt to be 2/2 amiodarone, resolved with decreased amiodarone dose.  Amio since d/c'd.  . Persistent atrial fibrillation (HCC)    a. status post multiple DCCVs; b. 2018 - eval for PVI but opted for rate control.  [name] PONV (postoperative nausea and vomiting)    oxycodone and codiene cause N/V   . Pre-syncope    a. In setting of dehydration and AKI in the past.  . 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 Resolved;  b. 08/2017 Echo: EF 50-55%, no rwma, mild MR, mildly to mod dil LA/RA.  [name] Venous insufficiency   . Vertigo    Past Surgical History:  Procedure Laterality Date  . ABDOMINAL HYSTERECTOMY  1990  . APPENDECTOMY    . AUGMENTATION MAMMAPLASTY Bilateral 1986   implants  . AUGMENTATION MAMMAPLASTY  1990  . AUGMENTATION MAMMAPLASTY  2011  . CARDIAC CATHETERIZATION    . CARDIOVERSION     x 3  . CARDIOVERSION    . CARDIOVERSION N/A 02/07/2017   Procedure: CARDIOVERSION;  Surgeon: Wellington Hampshire, MD;  Location: [facility];  Service: Cardiovascular;  Laterality: N/A;  . CARDIOVERSION N/A 07/22/2017   Procedure: Cardioversion;  Surgeon: Minna Merritts, MD;  Location: [facility];  Service: Cardiovascular;  Laterality: N/A;  . CATARACT EXTRACTION W/PHACO Right 09/21/2016   Procedure: CATARACT EXTRACTION PHACO AND INTRAOCULAR LENS PLACEMENT ([place]);  Surgeon: Birder Robson, MD;  Location: [facility];  Service: Ophthalmology;  Laterality: Right;  [place] 44.1 AP% 16.5 CDE 7.30 Fluid Pack Lot #[id number]  . CATARACT EXTRACTION W/PHACO Left 10/19/2016   Procedure: CATARACT EXTRACTION PHACO AND INTRAOCULAR LENS PLACEMENT (IOC);  Surgeon: Birder Robson, MD;  Location: [facility];  Service: Ophthalmology;  Laterality: Left;  [place] 53.7 AP% 19.5 CDE 10.45 Fluid pack lot # [id number]  . CHOLECYSTECTOMY    . COMBINED AUGMENTATION MAMMAPLASTY AND ABDOMINOPLASTY    . JOINT REPLACEMENT Left 06/04/2013   left knee  . KNEE ARTHROSCOPY Right 08/16/2016   Procedure: ARTHROSCOPY KNEE, tear posterior horn medial meniscus, tear anterior and posterior horns of lateral meniscus, chondromalacia of lateral compartment grade 3 patella and grade 4 medial;  Surgeon: Dereck Leep, MD;  Location: [facility];  Service: Orthopedics;  Laterality: Right;  . MASTECTOMY  1986   Bilateral with silicone  breast implants, s/p saline replacements  . Multiple orthopedic procedures    .  NOSE SURGERY    . TEE WITHOUT CARDIOVERSION N/A 09/26/2017   Procedure: TRANSESOPHAGEAL ECHOCARDIOGRAM (TEE);  Surgeon: Fay Records, MD;  [facility];  Service: Cardiovascular;  Laterality: N/A;  . TOTAL KNEE ARTHROPLASTY Left     Allergies  Allergies  Allergen Reactions  . Iodine Anaphylaxis    NO PROBLEMS WITH BETADINE  . Biaxin [Clarithromycin] Nausea And Vomiting  . Codeine Nausea And Vomiting  . Enalapril Other (See Comments)    unknown  . Fluocinonide Other (See Comments)     Tingling sensation in head and redness to scalp.  . Oxycodone Nausea And Vomiting  . Promethazine Other (See Comments)    Unknown   . Tizanidine Hcl Other (See Comments)    hypotension hypotension  . Warfarin And Related   . Xarelto [Rivaroxaban] Other (See Comments)    Unknown     History of Present Illness    82 year old female with the above complex past medical history including now permanent atrial fibrillation along with prior history of atrial flutter status post multiple cardioversions in the past.  She also has a history of tachycardia induced cardiomyopathy though this has resolved.  She has undergone diagnostic catheterization in the past without evidence of significant coronary artery disease.  In 2018, she was referred to electrophysiology for consideration of atrial fibrillation ablation.  After further discussion, both she and Dr. Rayann Heman agreed to pursue rate control and her amiodarone was discontinued, as she was not maintaining sinus rhythm anyway.  She was last seen in clinic in December, at which time it was felt she is doing reasonably well.  Unfortunately, since then, she notes fatigue throughout most days, often associated with heart rates in the 90s-1 teens.  This despite taking metoprolol 25 mg twice daily.  She thinks, though is not sure, that when heart rates are less than 85 or so, that she is less likely to feel fatigued.  She has also noted some fluctuation in her blood pressures, especially in the evenings prior to taking her amlodipine.  As result, she has been holding this from time to time.  She does have a list of blood pressures and heart rates with her today.  Typically, heart rates are in the 80s-90s while blood pressures are in the 120s-130s.  She denies chest pain, dyspnea, PND, orthopnea, dizziness, syncope, edema, or early satiety.  Home Medications    Prior to Admission medications   Medication Sig Start Date End Date Taking? Authorizing Provider    acetaminophen (TYLENOL) 500 MG tablet Take 1,000 mg by mouth every 6 (six) hours as needed (pain).    Yes [provider]  aluminum-magnesium hydroxide 200-200 MG/5ML suspension Take 20 mLs by mouth every 6 (six) hours as needed for indigestion.    Yes [provider]  amLODipine (NORVASC) 2.5 MG tablet Take 1 tablet (2.5 mg total) by mouth 2 (two) times daily. 01/11/18  Yes Wellington Hampshire, MD  apixaban (ELIQUIS) 5 MG TABS tablet Take 1 tablet (5 mg total) by mouth 2 (two) times daily. 01/13/18  Yes Wellington Hampshire, MD  cholecalciferol (VITAMIN D) 1000 units tablet Take 1,000 Units by mouth daily.   Yes [provider]  HYDROcodone-acetaminophen (NORCO/VICODIN) 5-325 MG tablet Take 1 tablet by mouth every 6 (six) hours as needed for moderate pain. 10/12/17  Yes Hinda Kehr, MD  metoprolol tartrate (LOPRESSOR) 25 MG tablet Take 1 tablet (25 mg total) by mouth 2 (two) times daily. 01/11/18  Yes [name]  A, MD  pantoprazole (PROTONIX) 40 MG tablet Take 1 tablet (40 mg total) by mouth daily. 12/31/16  Yes Wellington Hampshire, MD  pravastatin (PRAVACHOL) 20 MG tablet TAKE 1 TABLET BY MOUTH EVERY DAY 01/09/18  Yes [name], MD  Probiotic Product (PROBIOTIC ADVANCED PO) Take 1 tablet by mouth daily.   Yes [provider]  sodium chloride (OCEAN) 0.65 % SOLN nasal spray Place 2 sprays into both nostrils 2 (two) times daily as needed for congestion.    Yes [provider]    Review of Systems    She notes fatigue in the setting of elevated heart rates.  She denies chest pain, palpitations, PND, orthopnea, dizziness, syncope, edema, or early satiety.  All other systems reviewed and are otherwise negative except as noted above.  Physical Exam    VS:  BP 130/80 (BP Location: Left Arm, Patient Position: Sitting, Cuff Size: Normal)   Pulse 86   Ht 5' 4.5" (1.638 m)   Wt 163 lb 8 oz (74.2 kg)   BMI 27.63 kg/m  , BMI Body mass index is 27.63  kg/m. GEN: Well nourished, well developed, in no acute distress.  HEENT: normal.  Neck: Supple, no JVD, carotid bruits, or masses. Cardiac: Irregularly irregular, no murmurs, rubs, or gallops. No clubbing, cyanosis, edema.  Radials/DP/PT 2+ and equal bilaterally.  Respiratory:  Respirations regular and unlabored, clear to auscultation bilaterally. GI: Soft, nontender, nondistended, BS + x 4. MS: no deformity or atrophy. Skin: warm and dry, no rash. Neuro:  Strength and sensation are intact. Psych: Normal affect.  Accessory Clinical Findings    ECG -atrial fibrillation, 86, right bundle branch block, inferolateral T wave inversion, no acute changes.  Assessment & Plan    1.  Permanent atrial fibrillation: Patient with prior history of paroxysmal A. fib and flutter status post multiple cardioversions and also previously maintained on amiodarone.  Since discussion with Dr. Rayann Heman last fall, she has been rate controlled and in permanent atrial fibrillation.  She experiences fatigue, especially when rates trend in the 90s to low 100s.  Heart rate today is 86.  She is currently on metoprolol 25 mg twice daily.  I am going to increase her metoprolol to 50 mill grams twice daily.  She indicates that sometimes she has low blood pressures in the evening causing her to hold her amlodipine.  I have asked her to discontinue amlodipine for the time being and see how blood pressure trends on the higher dose of beta-blocker.  She remains anticoagulated on Eliquis.  If we are unable to achieve adequate rate control or help afford her fatigue, she would be willing to reconsider evaluation for catheter ablation.  2.  Essential hypertension: This has been stable though she has noted some low blood pressures in the 90s at home.  Going to hold amlodipine as we are increasing metoprolol.  3.  History of tachycardia induced cardia myopathy: EF 50-55% on most recent echo.  She is euvolemic on exam.  4.   Hyperlipidemia: LDL 74 in August 2018.  Continue pravastatin therapy.  5.  Disposition: Increasing beta-blocker.  We will follow-up in a month to reevaluate symptoms and rate control and determine whether or not referral back to A. fib clinic is appropriate.   Murray Hodgkins, NP 02/01/2018, 4:52 PM

## 2018-02-14 DIAGNOSIS — L570 Actinic keratosis: Secondary | ICD-10-CM | POA: Diagnosis not present

## 2018-02-14 DIAGNOSIS — Z872 Personal history of diseases of the skin and subcutaneous tissue: Secondary | ICD-10-CM | POA: Diagnosis not present

## 2018-02-14 DIAGNOSIS — L821 Other seborrheic keratosis: Secondary | ICD-10-CM | POA: Diagnosis not present

## 2018-02-14 DIAGNOSIS — Z85828 Personal history of other malignant neoplasm of skin: Secondary | ICD-10-CM | POA: Diagnosis not present

## 2018-02-14 DIAGNOSIS — L578 Other skin changes due to chronic exposure to nonionizing radiation: Secondary | ICD-10-CM | POA: Diagnosis not present

## 2018-02-24 ENCOUNTER — Other Ambulatory Visit: Payer: Self-pay | Admitting: Cardiovascular Disease

## 2018-03-03 ENCOUNTER — Encounter: Payer: Self-pay | Admitting: Cardiovascular Disease

## 2018-03-03 ENCOUNTER — Ambulatory Visit: Payer: Medicare Other | Admitting: Cardiovascular Disease

## 2018-03-03 VITALS — BP 142/80 | HR 86 | Ht 64.5 in | Wt 162.0 lb

## 2018-03-03 DIAGNOSIS — I4819 Other persistent atrial fibrillation: Secondary | ICD-10-CM

## 2018-03-03 DIAGNOSIS — R42 Dizziness and giddiness: Secondary | ICD-10-CM | POA: Diagnosis not present

## 2018-03-03 DIAGNOSIS — I481 Persistent atrial fibrillation: Secondary | ICD-10-CM | POA: Diagnosis not present

## 2018-03-03 DIAGNOSIS — I10 Essential (primary) hypertension: Secondary | ICD-10-CM | POA: Diagnosis not present

## 2018-03-03 MED ORDER — DILTIAZEM HCL ER COATED BEADS 180 MG PO CP24: 180.0000 mg | ORAL_CAPSULE | Freq: Every day | ORAL | 6 refills | Status: DC

## 2018-03-03 NOTE — Patient Instructions (Signed)
Medication Instructions: - Your physician has recommended you make the following change in your medication:  1) STOP metoprolol 2) START Cardizem (diltiazem) 180 mg- take 1 capsule by mouth once daily  Labwork: - Your physician recommends that you have lab work today: BMP/ CBC  Procedures/Testing: - Your physician has requested that you have an echocardiogram. Echocardiography is a painless test that uses sound waves to create images of your heart. It provides your doctor with information about the size and shape of your heart and how well your heart's chambers and valves are working. This procedure takes approximately one hour. There are no restrictions for this procedure.  Follow-Up: - Your physician recommends that you schedule a follow-up appointment in: 2 months with Dr. Fletcher Anon.   Any Additional Special Instructions Will Be Listed Below (If Applicable).     If you need a refill on your cardiac medications before your next appointment, please call your pharmacy.

## 2018-03-03 NOTE — Progress Notes (Signed)
Cardiology Office Note   Date:  03/03/2018   ID:  [name] [name], DOB Jun 07, 1936, MRN [id number]  PCP:  [name], MD  Cardiologist:   [name], MD   Chief Complaint  Patient presents with  . OTHER    1 month f/u c/o fatigue, sob, nervousness and dizziness with recommended increase of Metoprolol. Pt now taking Meto. 1/2 am and 1 tab pm. Meds reviewed verbally with pt.      History of Present Illness: [name] [name] is a 82 y.o. female who presents for a followup visit regarding permanent atrial fibrillation.   She has known history of  atrial fibrillation/flutter status post multiple cardioversions in the past (total of 7). 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 She had cardiac catheterization twice in the past without significant coronary artery disease.  Most recent cardioversion was in Feb of 2018.  Most recent echocardiogram in September 2018 showed an EF of 50-55%, mild mitral regurgitation and moderate biatrial enlargement. The patient was seen by Dr. Rayann Heman consider A. fib ablation but after discussion, she elected for rate control.  She had worsening symptoms of dizziness and fatigue over the last month and was seen by Ignacia Bayley.  The dose of metoprolol was increased to 50 mg twice daily but she did not tolerate that dose.  Thus, she decreased the morning dose to 25 mg and evening dose remained at 50 mg daily.  Initially, her symptoms improved over the last few weeks she reports extreme dizziness, fatigue and increased shortness of breath without chest pain.  She has no energy to do anything.  Past Medical History:  Diagnosis Date  . Arthritis   . Atrial flutter ([place]) 02/2011   s/p cardioversion   . Cancer (HCC)    BREAST  . Chest pain    a. H/o cardiac cath x 2, last 2012 -->nl cors;  b. 12/2016 MV: EF 61%, small region of mild perfusion defect in the apical anteroseptal region c/w breast attenuation, no  ischemia-->Low risk.  . CKD (chronic kidney disease), stage III ([place])   . Cystocele   . GERD (gastroesophageal reflux disease)   . Headache(784.0)    chronic  . Hyperlipidemia   . Hypertension   . Knee fracture   . Mobitz type 2 second degree atrioventricular block    a. felt to be 2/2 amiodarone, resolved with decreased amiodarone dose.  Amio since d/c'd.  . Persistent atrial fibrillation (HCC)    a. status post multiple DCCVs; b. 2018 - eval for PVI but opted for rate control.  [name] PONV (postoperative nausea and vomiting)    oxycodone and codiene cause N/V   . Pre-syncope    a. In setting of dehydration and AKI in the past.  . 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 Resolved;  b. 08/2017 Echo: EF 50-55%, no rwma, mild MR, mildly to mod dil LA/RA.  [name] Venous insufficiency   . Vertigo     Past Surgical History:  Procedure Laterality Date  . ABDOMINAL HYSTERECTOMY  1990  . APPENDECTOMY    . AUGMENTATION MAMMAPLASTY Bilateral 1986   implants  . AUGMENTATION MAMMAPLASTY  1990  . AUGMENTATION MAMMAPLASTY  2011  . CARDIAC CATHETERIZATION    . CARDIOVERSION     x 3  . CARDIOVERSION    . CARDIOVERSION N/A 02/07/2017   Procedure: CARDIOVERSION;  Surgeon: Wellington Hampshire, MD;  Location: [facility];  Service: Cardiovascular;  Laterality: N/A;  .  CARDIOVERSION N/A 07/22/2017   Procedure: Cardioversion;  Surgeon: Minna Merritts, MD;  Location: [facility];  Service: Cardiovascular;  Laterality: N/A;  . CATARACT EXTRACTION W/PHACO Right 09/21/2016   Procedure: CATARACT EXTRACTION PHACO AND INTRAOCULAR LENS PLACEMENT ([place]);  Surgeon: Birder Robson, MD;  Location: [facility];  Service: Ophthalmology;  Laterality: Right;  [place] 44.1 AP% 16.5 CDE 7.30 Fluid Pack Lot #[id number]  . CATARACT EXTRACTION W/PHACO Left 10/19/2016   Procedure: CATARACT EXTRACTION PHACO AND INTRAOCULAR LENS PLACEMENT (I[facility]);  Surgeon: Birder Robson, MD;  Location: [facility];  Service: Ophthalmology;  Laterality: Left;   [place] 53.7 AP% 19.5 CDE 10.45 Fluid pack lot # [id number]  . CHOLECYSTECTOMY    . COMBINED AUGMENTATION MAMMAPLASTY AND ABDOMINOPLASTY    . JOINT REPLACEMENT Left 06/04/2013   left knee  . KNEE ARTHROSCOPY Right 08/16/2016   Procedure: ARTHROSCOPY KNEE, tear posterior horn medial meniscus, tear anterior and posterior horns of lateral meniscus, chondromalacia of lateral compartment grade 3 patella and grade 4 medial;  Surgeon: Dereck Leep, MD;  Location: [facility];  Service: Orthopedics;  Laterality: Right;  . MASTECTOMY  1986   Bilateral with silicone  breast implants, s/p saline replacements  . Multiple orthopedic procedures    . NOSE SURGERY    . TEE WITHOUT CARDIOVERSION N/A 09/26/2017   Procedure: TRANSESOPHAGEAL ECHOCARDIOGRAM (TEE);  Surgeon: Fay Records, MD;  Location: [facility];  Service: Cardiovascular;  Laterality: N/A;  . TOTAL KNEE ARTHROPLASTY Left      Current Outpatient Medications  Medication Sig Dispense Refill  . acetaminophen (TYLENOL) 500 MG tablet Take 1,000 mg by mouth every 6 (six) hours as needed (pain).     [name] aluminum-magnesium hydroxide 200-200 MG/5ML suspension Take 20 mLs by mouth every 6 (six) hours as needed for indigestion.     [name] apixaban (ELIQUIS) 5 MG TABS tablet Take 1 tablet (5 mg total) by mouth 2 (two) times daily. 180 tablet 3  . cholecalciferol (VITAMIN D) 1000 units tablet Take 1,000 Units by mouth daily.    [name] HYDROcodone-acetaminophen (NORCO/VICODIN) 5-325 MG tablet Take 1 tablet by mouth every 6 (six) hours as needed for moderate pain. 15 tablet 0  . metoprolol tartrate (LOPRESSOR) 50 MG tablet Take 1 tablet (50 mg total) by mouth 2 (two) times daily. 60 tablet 6  . pantoprazole (PROTONIX) 40 MG tablet TAKE 1 TABLET BY MOUTH EVERY DAY 30 tablet 11  . pravastatin (PRAVACHOL) 20 MG tablet TAKE 1 TABLET BY MOUTH EVERY DAY 90 tablet 0  . Probiotic Product (PROBIOTIC ADVANCED PO) Take 1 tablet by mouth daily.    . sodium chloride (OCEAN) 0.65 %  SOLN nasal spray Place 2 sprays into both nostrils 2 (two) times daily as needed for congestion.      No current facility-administered medications for this visit.     Allergies:   Iodine; Biaxin [clarithromycin]; Codeine; Enalapril; Fluocinonide; Oxycodone; Promethazine; Tizanidine hcl; Warfarin and related; and Xarelto [rivaroxaban]    Social History:  The patient  reports that  has never smoked. she has never used smokeless tobacco. She reports that she does not drink alcohol or use drugs.   Family History:  The patient's family history includes Breast cancer in her maternal grandmother, sister, sister, and sister; Cancer in her father, maternal grandmother, sister, sister, and sister; Diabetes in her brother; Heart disease in her mother and son.    ROS:  Please see the history of present illness.   Otherwise, review of systems are positive for none.  All other systems are reviewed and negative.    PHYSICAL EXAM: VS:  BP (!) 142/80 (BP Location: Left Arm, Patient Position: Sitting, Cuff Size: Normal)   Pulse 86   Ht 5' 4.5" (1.638 m)   Wt 162 lb (73.5 kg)   BMI 27.38 kg/m  , BMI Body mass index is 27.38 kg/m. GEN: Well nourished, well developed, in no acute distress  HEENT: normal  Neck: no JVD, carotid bruits, or masses Cardiac: Irregularly irregular; no murmurs, rubs, or gallops,no edema  Respiratory:  clear to auscultation bilaterally, normal work of breathing GI: soft, nontender, nondistended, + BS MS: no deformity or atrophy  Skin: warm and dry, no rash Neuro:  Strength and sensation are intact Psych: euthymic mood, full affect   EKG:  EKG is ordered today. The ekg ordered today demonstrates atrial fibrillation with ventricular rate of 86 bpm, right bundle branch block and inferolateral T wave changes.   Recent Labs: 07/18/2017: Magnesium 2.5 07/21/2017: TSH 3.500 09/12/2017: Hemoglobin 12.7; Platelets 270 10/11/2017: ALT 18; BUN 18; Creatinine, Ser 0.98; Potassium  4.4; Sodium 137    Lipid Panel    Component Value Date/Time   CHOL 147 07/22/2017 1332   TRIG 51 07/22/2017 1332   HDL 63 07/22/2017 1332   CHOLHDL 2.3 07/22/2017 1332   VLDL 10 07/22/2017 1332   LDLCALC 74 07/22/2017 1332      Wt Readings from Last 3 Encounters:  03/03/18 162 lb (73.5 kg)  02/01/18 163 lb 8 oz (74.2 kg)  12/09/17 159 lb 12 oz (72.5 kg)       ASSESSMENT AND PLAN:  1.  Permanent atrial fibrillation: Ventricular rate is reasonably controlled on current dose of metoprolol .  However, she reports significant worsening of symptoms including dizziness, fatigue and shortness of breath.  I requested routine labs including CBC and basic metabolic profile. She thinks metoprolol might be making her too tired.  I elected to switch to diltiazem extended release 180 mg once daily as a trial. I am also going to repeat her echocardiogram and consider sending her back to see Dr. Rayann Heman to consider ablation if no significant improvement.  Previously, the enthusiasm was not high for ablation given her age and dilated atria.  I doubt that her symptoms are due to coronary artery disease.  Catheterizations in the past showed no significant disease and stress test last year was unremarkable.  2. Essential hypertension: Blood pressure is reasonably controlled.  3. History of tachycardia-induced cardiomyopathy. Most recent ejection fraction was low normal at 50-55%.  4. Left carotid calcifications: Carotid Doppler in August of this year showed mild nonobstructive disease.  Disposition:   FU with me in 2 months  Signed,  [name], MD  03/03/2018 10:55 AM    [place]

## 2018-03-04 LAB — CBC WITH DIFFERENTIAL/PLATELET
Basophils Absolute: 0 10*3/uL (ref 0.0–0.2)
Basos: 0 %
EOS (ABSOLUTE): 0.1 10*3/uL (ref 0.0–0.4)
Eos: 1 %
Hematocrit: 40.5 % (ref 34.0–46.6)
Hemoglobin: 13.2 g/dL (ref 11.1–15.9)
Immature Grans (Abs): 0 10*3/uL (ref 0.0–0.1)
Immature Granulocytes: 0 %
Lymphocytes Absolute: 1.2 10*3/uL (ref 0.7–3.1)
Lymphs: 17 %
MCH: 31.8 pg (ref 26.6–33.0)
MCHC: 32.6 g/dL (ref 31.5–35.7)
MCV: 98 fL — ABNORMAL HIGH (ref 79–97)
Monocytes Absolute: 0.4 10*3/uL (ref 0.1–0.9)
Monocytes: 5 %
Neutrophils Absolute: 5.6 10*3/uL (ref 1.4–7.0)
Neutrophils: 77 %
Platelets: 261 10*3/uL (ref 150–379)
RBC: 4.15 x10E6/uL (ref 3.77–5.28)
RDW: 13.8 % (ref 12.3–15.4)
WBC: 7.3 10*3/uL (ref 3.4–10.8)

## 2018-03-04 LAB — BASIC METABOLIC PANEL
BUN/Creatinine Ratio: 17 (ref 12–28)
BUN: 18 mg/dL (ref 8–27)
CO2: 22 mmol/L (ref 20–29)
Calcium: 9.2 mg/dL (ref 8.7–10.3)
Chloride: 102 mmol/L (ref 96–106)
Creatinine, Ser: 1.05 mg/dL — ABNORMAL HIGH (ref 0.57–1.00)
GFR calc Af Amer: 58 mL/min/{1.73_m2} — ABNORMAL LOW (ref >59)
GFR calc non Af Amer: 50 mL/min/{1.73_m2} — ABNORMAL LOW (ref >59)
Glucose: 81 mg/dL (ref 65–99)
Potassium: 5.2 mmol/L (ref 3.5–5.2)
Sodium: 139 mmol/L (ref 134–144)

## 2018-03-10 ENCOUNTER — Ambulatory Visit: Payer: Self-pay

## 2018-03-10 NOTE — Telephone Encounter (Signed)
Patient called with c/o "SOB, chest tightness, dizziness." She says "I have a history of A-Fib and had many shocks. I don't know if this is something with my heart or if it's with my lungs. 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 It's not bad at rest, but still noticeable. The chest tightness is mild, but noticeable and not related to anything I do. The dizziness comes and goes." I asked about chest pain, she says "no pain, just the tightness. I want to see if Dr. Derrel Nip has any time available to do a x-ray of my lungs or an EKG, whichever she thinks I need to have." I asked has she been coughing, she says "no." I asked about the severity of the SOB, she says "well, probably mild and some moderate. It's only when I go up stairs or have to walk for a distance when I do something." She says "I can just wait until Monday to be seen, but I want a physician." I advised no availability with her provider or any MD's in the practice for today or Monday, but there is availability with the NP in the practice, she says "I need to see someone, so that is fine." According to protocol, see PCP within 24 hours or 3 days, appointment made for Monday, 03/13/18 with Almira Coaster, NP, care advice given, patient verbalized understanding.  Reason for Disposition . [1] MILD difficulty breathing (e.g., minimal/no SOB at rest, SOB with walking, pulse <100) AND [2] NEW-onset or WORSE than normal . [1] MODERATE longstanding difficulty breathing (e.g., speaks in phrases, SOB even at rest, pulse 100-120) AND [2] SAME as normal  Answer Assessment - Initial Assessment Questions 1. RESPIRATORY STATUS: "Describe your breathing?" (e.g., wheezing, shortness of breath, unable to speak, severe coughing)      SOB when walking up steps 2. ONSET: "When did this breathing problem begin?"      Hard to pin point, but it's been ongoing for a while 3. PATTERN "Does the difficult breathing come and  go, or has it been constant since it started?"      Worse when going up stairs 4. SEVERITY: "How bad is your breathing?" (e.g., mild, moderate, severe)    - MILD: No SOB at rest, mild SOB with walking, speaks normally in sentences, can lay down, no retractions, pulse < 100.    - MODERATE: SOB at rest, SOB with minimal exertion and prefers to sit, cannot lie down flat, speaks in phrases, mild retractions, audible wheezing, pulse 100-120.    - SEVERE: Very SOB at rest, speaks in single words, struggling to breathe, sitting hunched forward, retractions, pulse > 120      Mild and Moderate 5. RECURRENT SYMPTOM: "Have you had difficulty breathing before?" If so, ask: "When was the last time?" and "What happened that time?"      No 6. CARDIAC HISTORY: "Do you have any history of heart disease?" (e.g., heart attack, angina, bypass surgery, angioplasty)      Afib, cardioversions 7. LUNG HISTORY: "Do you have any history of lung disease?"  (e.g., pulmonary embolus, asthma, emphysema)     No 8. CAUSE: "What do you think is causing the breathing problem?"      I don't know 9. OTHER SYMPTOMS: "Do you have any other symptoms? (e.g., dizziness, runny nose, cough, chest pain, fever)     Dizziness off and on, SOB with exertion, chest tightness-a little, worse when  walking up stairs 10. PREGNANCY: "Is there any chance you are pregnant?" "When was your last menstrual period?"       No 11. TRAVEL: "Have you traveled out of the country in the last month?" (e.g., travel history, exposures)       No  Protocols used: BREATHING DIFFICULTY-A-AH

## 2018-03-12 NOTE — Progress Notes (Signed)
Subjective:    Patient ID: [name] [name], female    DOB: 08/18/1936, 82 y.o.   MRN: [id number]  HPI  [name] [name] is an 82 yo female who presents today with fatigue and SOB.  Symptoms have been present on a chronic basis however she reports noticing these symptoms as worsening x 1 week. She believes that she does notice worsening SOB after doing yard work.   She has a history of permanent atrial fibrillation/flutter, tachycardia induced cardiomyopathy, pulmonary HTN, and GERD. She also has had 7 cardioversions in the past. Last cardioversion was 01/2017. Last ECHO 08/2017 that noted normal EF with stable findings of mild mitral regurgitation and bilatrial enlargement. Last carotid doppler 07/2017 showed mild nonobstructive disease.   She was evaluated by cardiology on 03/03/18 for a follow up visit and noted worsening symptoms of dizziness and fatigue with a change in her dose of metoprolol. She also noted increased SOB without chest pain and decreased energy level.  A trial of diltiazem 180 mg every 24 hours has been initiated instead of metoprolol for symtpoms.  She was also seen previously  by Dr. Rayann Heman to consider A Fib ablation but chose rate control therapy previously.  After visit on 03/03/18 with cardiology, her plan indicated that although her ventricular rate was reasonable controlled with metoprolol symptoms of dizziness, fatigue, and SOB promoted change to diltiazem and evaluation of CBC andBmP. Lab results were stable and she has been scheduled for a repeat ECHO (03/28/18) with consideration of sending her back to Dr. Rayann Heman to consider ablation if no improvement. Evaluation further noted that symptoms were less likely due to coronary artery disease as catheterizations in the past did not indicate significant disease and stress test last year was unremarkable.   EKG on 03/03/18 noted atrial fibrillation with ventricular rate of 86, right bundle branch block, and inferolateral T wave changes.    Today, she reports that she has stopped her diltiazem as she states that she did not feel this holds her BP down. She went back to metoprolol 50 mg in the am and 25 mg in the evening. She tried diltiazem for at least 2 days then switched back to metoprolol. She does correlate symptoms of fatigue and SOB worsening over the past week when she restarted metoprolol.    Today, patient states that symptoms of SOB is her concern Symptoms of fatigue which is chronic remains She denies dizziness, chest pain, palpitations, numbness, tingling, weakness, edema, HA, cough, nasal congestion, or sinus pressure.  She does report episodes of reflux that occurs intermittently. She does report drinking enough water that urine is pale yellow or clear.   Monitoring of BP is noted as systolic of 283T and diastolic of 51V when taking metoprolol. She denies symptoms of orthostatic hypotension.   Review of Systems  Constitutional: Positive for fatigue. Negative for chills and fever.  Eyes: Negative for visual disturbance.  Respiratory: Positive for shortness of breath. Negative for cough and wheezing.   Cardiovascular: Negative for chest pain, palpitations and leg swelling.  Gastrointestinal: Negative for abdominal pain, constipation, diarrhea, nausea and vomiting.  Skin: Negative for rash.  Neurological: Negative for dizziness, weakness, light-headedness, numbness and headaches.  Psychiatric/Behavioral:       Denies depressed or anxious mood today.   Past Medical History:  Diagnosis Date  . Arthritis   . Atrial flutter ([place]) 02/2011   s/p cardioversion   . Cancer (HCC)    BREAST  . Chest pain  a. H/o cardiac cath x 2, last 2012 -->nl cors;  b. 12/2016 MV: EF 61%, small region of mild perfusion defect in the apical anteroseptal region c/w breast attenuation, no ischemia-->Low risk.  . CKD (chronic kidney disease), stage III ([place])   . Cystocele   . GERD (gastroesophageal reflux disease)   .  Headache(784.0)    chronic  . Hyperlipidemia   . Hypertension   . Knee fracture   . Mobitz type 2 second degree atrioventricular block    a. felt to be 2/2 amiodarone, resolved with decreased amiodarone dose.  Amio since d/c'd.  . Persistent atrial fibrillation (HCC)    a. status post multiple DCCVs; b. 2018 - eval for PVI but opted for rate control.  [name] PONV (postoperative nausea and vomiting)    oxycodone and codiene cause N/V   . Pre-syncope    a. In setting of dehydration and AKI in the past.  . 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)    a. Resolved;  b. 08/2017 Echo: EF 50-55%, no rwma, mild MR, mildly to mod dil LA/RA.  [name] Venous insufficiency   . Vertigo      Social History   Socioeconomic History  . Marital status: Married    Spouse name: Not on file  . Number of children: 1  . Years of education: Not on file  . Highest education level: Not on file  Occupational History    Employer: RETIRED  Social Needs  . Financial resource strain: Not on file  . Food insecurity:    Worry: Not on file    Inability: Not on file  . Transportation needs:    Medical: Not on file    Non-medical: Not on file  Tobacco Use  . Smoking status: Never Smoker  . Smokeless tobacco: Never Used  Substance and Sexual Activity  . Alcohol use: No  . Drug use: No  . Sexual activity: Never    Birth control/protection: Surgical  Lifestyle  . Physical activity:    Days per week: Not on file    Minutes per session: Not on file  . Stress: Not on file  Relationships  . Social connections:    Talks on phone: Not on file    Gets together: Not on file    Attends religious service: Not on file    Active member of club or organization: Not on file    Attends meetings of clubs or organizations: Not on file    Relationship status: Not on file  . Intimate partner violence:    Fear of current or ex partner: Not on file    Emotionally abused: Not on file    Physically abused: Not on file    Forced  sexual activity: Not on file  Other Topics Concern  . Not on file  Social History Narrative   Married     Past Surgical History:  Procedure Laterality Date  . ABDOMINAL HYSTERECTOMY  1990  . APPENDECTOMY    . AUGMENTATION MAMMAPLASTY Bilateral 1986   implants  . AUGMENTATION MAMMAPLASTY  1990  . AUGMENTATION MAMMAPLASTY  2011  . CARDIAC CATHETERIZATION    . CARDIOVERSION     x 3  . CARDIOVERSION    . CARDIOVERSION N/A 02/07/2017   Procedure: CARDIOVERSION;  Surgeon: Wellington Hampshire, MD;  Location: [facility];  Service: Cardiovascular;  Laterality: N/A;  . CARDIOVERSION N/A 07/22/2017   Procedure: Cardioversion;  Surgeon: Minna Merritts, MD;  Location: [facility];  Service: Cardiovascular;  Laterality:  N/A;  . CATARACT EXTRACTION W/PHACO Right 09/21/2016   Procedure: CATARACT EXTRACTION PHACO AND INTRAOCULAR LENS PLACEMENT (IOC);  Surgeon: Birder Robson, MD;  Location: [facility];  Service: Ophthalmology;  Laterality: Right;  [place] 44.1 AP% 16.5 CDE 7.30 Fluid Pack Lot #[id number]  . CATARACT EXTRACTION W/PHACO Left 10/19/2016   Procedure: CATARACT EXTRACTION PHACO AND INTRAOCULAR LENS PLACEMENT (IOC);  Surgeon: Birder Robson, MD;  Location: [facility];  Service: Ophthalmology;  Laterality: Left;  [place] 53.7 AP% 19.5 CDE 10.45 Fluid pack lot # [id number]  . CHOLECYSTECTOMY    . COMBINED AUGMENTATION MAMMAPLASTY AND ABDOMINOPLASTY    . JOINT REPLACEMENT Left 06/04/2013   left knee  . KNEE ARTHROSCOPY Right 08/16/2016   Procedure: ARTHROSCOPY KNEE, tear posterior horn medial meniscus, tear anterior and posterior horns of lateral meniscus, chondromalacia of lateral compartment grade 3 patella and grade 4 medial;  Surgeon: Dereck Leep, MD;  Location: [facility];  Service: Orthopedics;  Laterality: Right;  . MASTECTOMY  1986   Bilateral with silicone  breast implants, s/p saline replacements  . Multiple orthopedic procedures    . NOSE SURGERY    . TEE WITHOUT CARDIOVERSION N/A 09/26/2017    Procedure: TRANSESOPHAGEAL ECHOCARDIOGRAM (TEE);  Surgeon: Fay Records, MD;  Location: [facility] ENDOSCOPY;  Service: Cardiovascular;  Laterality: N/A;  . TOTAL KNEE ARTHROPLASTY Left     Family History  Problem Relation Age of Onset  . Heart disease Mother   . Cancer Father        stomach  . Heart disease Son        found at autopsy  . Cancer Sister        breast  . Breast cancer Sister   . Cancer Maternal Grandmother        breast  . Breast cancer Maternal Grandmother   . Cancer Sister        breast  . Breast cancer Sister   . Breast cancer Sister   . Cancer Sister        breast  . Diabetes Brother   . Ovarian cancer Neg Hx     Allergies  Allergen Reactions  . Iodine Anaphylaxis    NO PROBLEMS WITH BETADINE  . Biaxin [Clarithromycin] Nausea And Vomiting  . Codeine Nausea And Vomiting  . Enalapril Other (See Comments)    unknown  . Fluocinonide Other (See Comments)    Tingling sensation in head and redness to scalp.  . Oxycodone Nausea And Vomiting  . Promethazine Other (See Comments)    Unknown   . Tizanidine Hcl Other (See Comments)    hypotension hypotension  . Warfarin And Related   . Xarelto [Rivaroxaban] Other (See Comments)    Unknown     Current Outpatient Medications on File Prior to Visit  Medication Sig Dispense Refill  . acetaminophen (TYLENOL) 500 MG tablet Take 1,000 mg by mouth every 6 (six) hours as needed (pain).     [name] aluminum-magnesium hydroxide 200-200 MG/5ML suspension Take 20 mLs by mouth every 6 (six) hours as needed for indigestion.     [name] apixaban (ELIQUIS) 5 MG TABS tablet Take 1 tablet (5 mg total) by mouth 2 (two) times daily. 180 tablet 3  . cholecalciferol (VITAMIN D) 1000 units tablet Take 1,000 Units by mouth daily.    . Coenzyme Q10 (COQ10 PO) Take by mouth daily.    [name] HYDROcodone-acetaminophen (NORCO/VICODIN) 5-325 MG tablet Take 1 tablet by mouth every 6 (six) hours as needed for moderate pain. 15  tablet 0  . metoprolol succinate  (TOPROL-XL) 50 MG 24 hr tablet Take 50 mg by mouth. Take 0.5mg  every morning and 1 at bedtime    . OVER THE COUNTER MEDICATION Saline nasal spray    . pantoprazole (PROTONIX) 40 MG tablet TAKE 1 TABLET BY MOUTH EVERY DAY 30 tablet 11  . pravastatin (PRAVACHOL) 20 MG tablet TAKE 1 TABLET BY MOUTH EVERY DAY 90 tablet 0  . Probiotic Product (PROBIOTIC ADVANCED PO) Take 1 tablet by mouth daily.    . sodium chloride (OCEAN) 0.65 % SOLN nasal spray Place 2 sprays into both nostrils 2 (two) times daily as needed for congestion.      No current facility-administered medications on file prior to visit.     BP 120/80 (BP Location: Left Arm, Patient Position: Sitting, Cuff Size: Normal)   Pulse 79   Temp 97.9 F (36.6 C) (Oral)   Ht 5' 4.5" (1.638 m)   Wt 162 lb 12.8 oz (73.8 kg)   SpO2 96%   BMI 27.51 kg/m       Objective:   Physical Exam  Constitutional: She is oriented to person, place, and time. She appears well-developed and well-nourished.  Eyes: Pupils are equal, round, and reactive to light. No scleral icterus.  Neck: Neck supple.  Cardiovascular: Exam reveals no gallop and no friction rub.  No murmur heard. Irregularly irregular HR  Pulmonary/Chest: Effort normal and breath sounds normal. She has no wheezes. She has no rales.  Abdominal: Soft. Bowel sounds are normal. There is no tenderness.  Musculoskeletal: She exhibits no edema.  Lymphadenopathy:    She has no cervical adenopathy.  Neurological: She is alert and oriented to person, place, and time. Coordination normal.  Skin: Skin is warm and dry. No rash noted.  Psychiatric: She has a normal mood and affect. Her behavior is normal. Judgment and thought content normal.          Assessment & Plan:  1. Persistent atrial fibrillation (HCC) HR controlled today at 79 on current dose of metoprolol. Lab work of CBC and BMP on 03/03/18 is stable. She stopped diltiazem and restarted metoprolol because she stated that diltiazem  did not control her BP. She could not remember what her BP was on diltiazem but reported this as "not controlling BP". Echo is scheduled 03/28/18.   2. Other fatigue Chronic in nature and she does not report worsening symptoms. Recent yard work with husband did make her feel tired however she reports that she does not feel worse today.  3. Shortness of breath Exam is reassuring today; Patient was walked up and down the hallway two times and O2 sats were stable and remained 96%. Heart rate ranged from 80s to low 90s with no lightheadedness noted. She reported mild dizziness that resolved spontaneously.   Recent increase in exercise with yard work and switching back to metoprolol which previously associated with symptoms of fatigue, dizziness, and SOB are most likely etiology of symptoms. We discussed the importance of obtaining Echo that has been ordered by her cardiologist and advised her to contact cardiology today to report stopping diltiazem and restarting metoprolol. She agreed to make an appointment with cardiology and will call today. She is also going to ask if Echo can be scheduled sooner.  Further advised her to seek medical attention immediately if symptoms worsen or she develops new symptoms. We reviewed reasons to call 911 including chest pain, increasing SOB, numbness, tingling, and weakness.  She voiced understand and  agreed with plan.  I spent 25 minutes with this patient and greater than 50% of the time was spent in face to face counseling regarding proper use of metoprolol and monitoring blood pressure/heart rate and bringing this information to her cardiologist today. We further reviewed prior lab results and also evaluated for changes in oxygen saturation and heart rate through activity.  Delano Metz, FNP-C

## 2018-03-13 ENCOUNTER — Ambulatory Visit (INDEPENDENT_AMBULATORY_CARE_PROVIDER_SITE_OTHER): Payer: Medicare Other | Admitting: Family Medicine

## 2018-03-13 ENCOUNTER — Encounter: Payer: Self-pay | Admitting: Family Medicine

## 2018-03-13 DIAGNOSIS — R5383 Other fatigue: Secondary | ICD-10-CM | POA: Diagnosis not present

## 2018-03-13 DIAGNOSIS — I4819 Other persistent atrial fibrillation: Secondary | ICD-10-CM

## 2018-03-13 DIAGNOSIS — I481 Persistent atrial fibrillation: Secondary | ICD-10-CM

## 2018-03-13 DIAGNOSIS — R0602 Shortness of breath: Secondary | ICD-10-CM

## 2018-03-13 NOTE — Patient Instructions (Signed)
Please follow up with cardiology and contact the office today to follow up on scheduling your ECHO. Also, please let them know that you are back on metoprolol and have stopped diltiazem that was recently changed.  If symptoms worsen, please seek medical attention immediately.

## 2018-03-14 ENCOUNTER — Telehealth: Payer: Self-pay | Admitting: Cardiovascular Disease

## 2018-03-14 NOTE — Telephone Encounter (Signed)
Patient left message stating she stopped cardizem and restarted metoprolol and would like a sooner echo appt. I s/w patient who reports cardizem 180mg  qd did not control BP and HR for 24 hours. SBP 150s. Metoprolol was switched to cardizem at March 15 OV due to fatigue. She has since discontinued cardizem and restarted metoprolol 25mg  AM; 50mg  PM. Patient saw FNP at Dr. Lupita Dawn office yesterday for SOB with recommendations for sooner echo appt. Pt agreeable to March 28 at 7:30am. Added to echo schedule.

## 2018-03-16 ENCOUNTER — Ambulatory Visit (INDEPENDENT_AMBULATORY_CARE_PROVIDER_SITE_OTHER): Payer: Medicare Other

## 2018-03-16 ENCOUNTER — Other Ambulatory Visit: Payer: Self-pay

## 2018-03-16 DIAGNOSIS — I481 Persistent atrial fibrillation: Secondary | ICD-10-CM

## 2018-03-16 DIAGNOSIS — I4819 Other persistent atrial fibrillation: Secondary | ICD-10-CM

## 2018-03-23 ENCOUNTER — Ambulatory Visit: Payer: Medicare Other

## 2018-03-28 ENCOUNTER — Other Ambulatory Visit: Payer: Medicare Other

## 2018-04-08 ENCOUNTER — Other Ambulatory Visit: Payer: Self-pay | Admitting: Internal Medicine

## 2018-04-13 DIAGNOSIS — K5909 Other constipation: Secondary | ICD-10-CM | POA: Diagnosis not present

## 2018-04-13 DIAGNOSIS — R1013 Epigastric pain: Secondary | ICD-10-CM | POA: Diagnosis not present

## 2018-04-13 DIAGNOSIS — K219 Gastro-esophageal reflux disease without esophagitis: Secondary | ICD-10-CM | POA: Diagnosis not present

## 2018-05-12 ENCOUNTER — Encounter: Payer: Self-pay | Admitting: Cardiovascular Disease

## 2018-05-12 ENCOUNTER — Ambulatory Visit: Payer: Medicare Other | Admitting: Cardiovascular Disease

## 2018-05-12 VITALS — BP 104/70 | HR 81 | Ht 64.5 in | Wt 164.0 lb

## 2018-05-12 DIAGNOSIS — I482 Chronic atrial fibrillation, unspecified: Secondary | ICD-10-CM

## 2018-05-12 DIAGNOSIS — I10 Essential (primary) hypertension: Secondary | ICD-10-CM | POA: Diagnosis not present

## 2018-05-12 MED ORDER — DIGOXIN 125 MCG PO TABS: 0.1250 mg | ORAL_TABLET | Freq: Every day | ORAL | 3 refills | Status: DC

## 2018-05-12 NOTE — Patient Instructions (Signed)
Medication Instructions:  Your physician has recommended you make the following change in your medication:  1- START Digoxin 0.125 mg (1 tablet) by mouth once a day.   Labwork: Your physician recommends that you return for lab work in: [place]. (BMET, Digoxin).   Testing/Procedures: NONE  Follow-Up: Your physician recommends that you schedule a follow-up appointment in: 2 WEEKS FOR LAB WORK.   Your physician recommends that you schedule a follow-up appointment in: [place].   If you need a refill on your cardiac medications before your next appointment, please call your pharmacy.

## 2018-05-12 NOTE — Progress Notes (Signed)
Cardiology Office Note   Date:  05/12/2018   ID:  [name] [name], DOB 02-Oct-1936, MRN [id number]  PCP:  [name], MD  Cardiologist:   [name], MD   Chief Complaint  Patient presents with  . other    2 month follow up. Meds reviewed by the pt. verbally. Pt. c/o rapid irreg. heart beats and shortness of breath.       History of Present Illness: [name] [name] is a 82 y.o. female who presents for a followup visit regarding permanent atrial fibrillation.   She has known history of  atrial fibrillation/flutter status post multiple cardioversions in the past (total of 7). 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 She had cardiac catheterization twice in the past without significant coronary artery disease.  Most recent cardioversion was in Feb of 2018.  Echocardiogram in September 2018 showed an EF of 50-55%, mild mitral regurgitation and moderate biatrial enlargement. The patient was seen by Dr. Rayann Heman consider A. fib ablation but after discussion, she elected for rate control. She is being treated with rate control.  During last visit, I switch her from metoprolol to diltiazem due to fatigue but diltiazem was not effective and she went back on metoprolol.  She currently takes 50 mg in the evening and 25 mg in the morning.  She reports intermittent palpitations with that.  She continues to complain of fatigue and low energy.  No chest pain. Echocardiogram in March showed an EF of 55 to 60% with mildly dilated left atrium and minimal pulmonary hypertension.   Past Medical History:  Diagnosis Date  . Arthritis   . Atrial flutter ([place]) 02/2011   s/p cardioversion   . Cancer (HCC)    BREAST  . Chest pain    a. H/o cardiac cath x 2, last 2012 -->nl cors;  b. 12/2016 MV: EF 61%, small region of mild perfusion defect in the apical anteroseptal region c/w breast attenuation, no ischemia-->Low risk.  . CKD (chronic kidney disease), stage  III ([place])   . Cystocele   . GERD (gastroesophageal reflux disease)   . Headache(784.0)    chronic  . Hyperlipidemia   . Hypertension   . Knee fracture   . Mobitz type 2 second degree atrioventricular block    a. felt to be 2/2 amiodarone, resolved with decreased amiodarone dose.  Amio since d/c'd.  . Persistent atrial fibrillation (HCC)    a. status post multiple DCCVs; b. 2018 - eval for PVI but opted for rate control.  [name] PONV (postoperative nausea and vomiting)    oxycodone and codiene cause N/V   . Pre-syncope    a. In setting of dehydration and AKI in the past.  . Tachycardia induced cardiomyopathy ([place])    a. Resolved;  b. 08/2017 Echo: EF 50-55%, no rwma, mild MR, mildly to mod dil LA/RA.  [name] Venous insufficiency   . Vertigo     Past Surgical History:  Procedure Laterality Date  . ABDOMINAL HYSTERECTOMY  1990  . APPENDECTOMY    . AUGMENTATION MAMMAPLASTY Bilateral 1986   implants  . AUGMENTATION MAMMAPLASTY  1990  . AUGMENTATION MAMMAPLASTY  2011  . CARDIAC CATHETERIZATION    . CARDIOVERSION     x 3  . CARDIOVERSION    . CARDIOVERSION N/A 02/07/2017   Procedure: CARDIOVERSION;  Surgeon: Wellington Hampshire, MD;  Location: [facility];  Service: Cardiovascular;  Laterality: N/A;  . CARDIOVERSION N/A 07/22/2017   Procedure: Cardioversion;  Surgeon: Minna Merritts, MD;  Location: [facility];  Service: Cardiovascular;  Laterality: N/A;  . CATARACT EXTRACTION W/PHACO Right 09/21/2016   Procedure: CATARACT EXTRACTION PHACO AND INTRAOCULAR LENS PLACEMENT ([place]);  Surgeon: Birder Robson, MD;  Location: [facility];  Service: Ophthalmology;  Laterality: Right;  [place] 44.1 AP% 16.5 CDE 7.30 Fluid Pack Lot #[id number]  . CATARACT EXTRACTION W/PHACO Left 10/19/2016   Procedure: CATARACT EXTRACTION PHACO AND INTRAOCULAR LENS PLACEMENT (IOC);  Surgeon: Birder Robson, MD;  Location: [facility];  Service: Ophthalmology;  Laterality: Left;  [place] 53.7 AP% 19.5 CDE 10.45 Fluid pack lot # [id number]  .  CHOLECYSTECTOMY    . COMBINED AUGMENTATION MAMMAPLASTY AND ABDOMINOPLASTY    . JOINT REPLACEMENT Left 06/04/2013   left knee  . KNEE ARTHROSCOPY Right 08/16/2016   Procedure: ARTHROSCOPY KNEE, tear posterior horn medial meniscus, tear anterior and posterior horns of lateral meniscus, chondromalacia of lateral compartment grade 3 patella and grade 4 medial;  Surgeon: Dereck Leep, MD;  Location: [facility];  Service: Orthopedics;  Laterality: Right;  . MASTECTOMY  1986   Bilateral with silicone  breast implants, s/p saline replacements  . Multiple orthopedic procedures    . NOSE SURGERY    . TEE WITHOUT CARDIOVERSION N/A 09/26/2017   Procedure: TRANSESOPHAGEAL ECHOCARDIOGRAM (TEE);  Surgeon: Fay Records, MD;  Location: [place];  Service: Cardiovascular;  Laterality: N/A;  . TOTAL KNEE ARTHROPLASTY Left      Current Outpatient Medications  Medication Sig Dispense Refill  . acetaminophen (TYLENOL) 500 MG tablet Take 1,000 mg by mouth every 6 (six) hours as needed (pain).     [name] aluminum-magnesium hydroxide 200-200 MG/5ML suspension Take 20 mLs by mouth every 6 (six) hours as needed for indigestion.     [name] amLODipine (NORVASC) 2.5 MG tablet Take 2.5 mg by mouth daily as needed.    [name] apixaban (ELIQUIS) 5 MG TABS tablet Take 1 tablet (5 mg total) by mouth 2 (two) times daily. 180 tablet 3  . cholecalciferol (VITAMIN D) 1000 units tablet Take 1,000 Units by mouth daily.    . Coenzyme Q10 (COQ10 PO) Take by mouth daily.    [name] HYDROcodone-acetaminophen (NORCO/VICODIN) 5-325 MG tablet Take 1 tablet by mouth every 6 (six) hours as needed for moderate pain. 15 tablet 0  . metoprolol succinate (TOPROL-XL) 50 MG 24 hr tablet Take 50 mg by mouth. Take 0.5mg  every morning and 1 at bedtime    . OVER THE COUNTER MEDICATION Saline nasal spray    . pantoprazole (PROTONIX) 40 MG tablet TAKE 1 TABLET BY MOUTH EVERY DAY 30 tablet 11  . pravastatin (PRAVACHOL) 20 MG tablet TAKE 1 TABLET BY MOUTH EVERY DAY 90  tablet 0  . Probiotic Product (PROBIOTIC ADVANCED PO) Take 1 tablet by mouth daily.     No current facility-administered medications for this visit.     Allergies:   Iodine; Biaxin [clarithromycin]; Codeine; Enalapril; Fluocinonide; Iodinated diagnostic agents; Oxycodone; Promethazine; Tizanidine hcl; Warfarin and related; and Xarelto [rivaroxaban]    Social History:  The patient  reports that she has never smoked. She has never used smokeless tobacco. She reports that she does not drink alcohol or use drugs.   Family History:  The patient's family history includes Breast cancer in her maternal grandmother, sister, sister, and sister; Cancer in her father, maternal grandmother, sister, sister, and sister; Diabetes in her brother; Heart disease in her mother and son.    ROS:  Please see the history of present illness.  Otherwise, review of systems are positive for none.   All other systems are reviewed and negative.    PHYSICAL EXAM: VS:  BP 104/70 (BP Location: Left Arm, Patient Position: Sitting, Cuff Size: Normal)   Pulse 81   Ht 5' 4.5" (1.638 m)   Wt 164 lb (74.4 kg)   BMI 27.72 kg/m  , BMI Body mass index is 27.72 kg/m. GEN: Well nourished, well developed, in no acute distress  HEENT: normal  Neck: no JVD, carotid bruits, or masses Cardiac: Irregularly irregular; no murmurs, rubs, or gallops,no edema  Respiratory:  clear to auscultation bilaterally, normal work of breathing GI: soft, nontender, nondistended, + BS MS: no deformity or atrophy  Skin: warm and dry, no rash Neuro:  Strength and sensation are intact Psych: euthymic mood, full affect   EKG:  EKG is ordered today. The ekg ordered today demonstrates atrial fibrillation with ventricular rate of 81 bpm, right bundle branch block and inferolateral T wave changes.   Recent Labs: 07/18/2017: Magnesium 2.5 07/21/2017: TSH 3.500 10/11/2017: ALT 18 03/03/2018: BUN 18; Creatinine, Ser 1.05; Hemoglobin 13.2; Platelets  261; Potassium 5.2; Sodium 139    Lipid Panel    Component Value Date/Time   CHOL 147 07/22/2017 1332   TRIG 51 07/22/2017 1332   HDL 63 07/22/2017 1332   CHOLHDL 2.3 07/22/2017 1332   VLDL 10 07/22/2017 1332   LDLCALC 74 07/22/2017 1332      Wt Readings from Last 3 Encounters:  05/12/18 164 lb (74.4 kg)  03/13/18 162 lb 12.8 oz (73.8 kg)  03/03/18 162 lb (73.5 kg)       ASSESSMENT AND PLAN:  1.  Permanent atrial fibrillation: Ventricular rate is reasonably controlled on current dose of metoprolol .  However, she continues to complain of intermittent palpitations and tachycardia.  I elected to add digoxin 0.125 mg once daily for better rate control.  Check basic metabolic profile and digoxin level in 2 weeks.  Continue current dose of Toprol.  Continue anticoagulation with Eliquis.    2. Essential hypertension: Blood pressure is well controlled.  3. History of tachycardia-induced cardiomyopathy. Most recent ejection fraction was 55%.  4. Left carotid calcifications: Carotid Doppler in August of this year showed mild nonobstructive disease.  Disposition:   FU with me in 3 months  Signed,  [name], MD  05/12/2018 10:47 AM    [place]

## 2018-05-16 DIAGNOSIS — R42 Dizziness and giddiness: Secondary | ICD-10-CM | POA: Diagnosis not present

## 2018-05-26 ENCOUNTER — Other Ambulatory Visit (INDEPENDENT_AMBULATORY_CARE_PROVIDER_SITE_OTHER): Payer: Medicare Other

## 2018-05-26 DIAGNOSIS — I482 Chronic atrial fibrillation, unspecified: Secondary | ICD-10-CM

## 2018-05-26 DIAGNOSIS — I10 Essential (primary) hypertension: Secondary | ICD-10-CM | POA: Diagnosis not present

## 2018-05-27 LAB — BASIC METABOLIC PANEL
BUN/Creatinine Ratio: 20 (ref 12–28)
BUN: 18 mg/dL (ref 8–27)
CO2: 26 mmol/L (ref 20–29)
Calcium: 9.3 mg/dL (ref 8.7–10.3)
Chloride: 101 mmol/L (ref 96–106)
Creatinine, Ser: 0.9 mg/dL (ref 0.57–1.00)
GFR calc Af Amer: 70 mL/min/{1.73_m2} (ref >59)
GFR calc non Af Amer: 61 mL/min/{1.73_m2} (ref >59)
Glucose: 91 mg/dL (ref 65–99)
Potassium: 5 mmol/L (ref 3.5–5.2)
Sodium: 139 mmol/L (ref 134–144)

## 2018-05-27 LAB — DIGOXIN LEVEL: Digoxin, Serum: 1.3 ng/mL — ABNORMAL HIGH (ref 0.5–0.9)

## 2018-05-29 ENCOUNTER — Telehealth: Payer: Self-pay | Admitting: Cardiovascular Disease

## 2018-05-29 DIAGNOSIS — R7889 Finding of other specified substances, not normally found in blood: Secondary | ICD-10-CM

## 2018-05-29 DIAGNOSIS — I4819 Other persistent atrial fibrillation: Secondary | ICD-10-CM

## 2018-05-29 MED ORDER — DIGOXIN 125 MCG PO TABS: 0.1250 mg | ORAL_TABLET | ORAL | 3 refills | Status: DC

## 2018-05-29 NOTE — Telephone Encounter (Signed)
Patient verbalized understanding of lab work from 05/26/18. She verbalized understanding to take digoxin 0.125mg  by mouth every other day [facility] work. Medication list updated and Digoxin lab work entered.

## 2018-05-29 NOTE — Telephone Encounter (Signed)
Pt calling back about lab work   Please call

## 2018-05-31 DIAGNOSIS — I10 Essential (primary) hypertension: Secondary | ICD-10-CM | POA: Diagnosis not present

## 2018-05-31 DIAGNOSIS — N183 Chronic kidney disease, stage 3 (moderate): Secondary | ICD-10-CM | POA: Diagnosis not present

## 2018-06-12 ENCOUNTER — Other Ambulatory Visit
Admission: RE | Admit: 2018-06-12 | Discharge: 2018-06-12 | Disposition: A | Discharge status: Home / Self Care | Payer: Medicare Other | Source: Ambulatory Visit | Attending: Cardiovascular Disease | Admitting: Cardiovascular Disease

## 2018-06-12 DIAGNOSIS — R7889 Finding of other specified substances, not normally found in blood: Secondary | ICD-10-CM | POA: Diagnosis not present

## 2018-06-12 DIAGNOSIS — I481 Persistent atrial fibrillation: Secondary | ICD-10-CM | POA: Diagnosis not present

## 2018-06-12 DIAGNOSIS — I4819 Other persistent atrial fibrillation: Secondary | ICD-10-CM

## 2018-06-12 LAB — DIGOXIN LEVEL: Digoxin Level: 0.3 ng/mL — ABNORMAL LOW (ref 0.8–2.0)

## 2018-06-15 ENCOUNTER — Telehealth: Payer: Self-pay | Admitting: Cardiovascular Disease

## 2018-06-15 ENCOUNTER — Other Ambulatory Visit: Payer: Self-pay | Admitting: Cardiovascular Disease

## 2018-06-15 NOTE — Telephone Encounter (Signed)
Please call the patient to find out what she is taking for metoprolol.  Someone already refilled her eliquis.   According to her last office note, she was on metoprolol succinate 50 mg- take 1/2 tablet (25 mg) in the AM & 1 tablet (50 mg) in the PM.  Please confirm what mg of tablet she has and how she is taking it.   Also make sure it is metoprolol succinate & not metoprolol tartrate.  Thank you!

## 2018-06-15 NOTE — Telephone Encounter (Signed)
Blood thinner Rx request.

## 2018-06-15 NOTE — Telephone Encounter (Signed)
Spoke to patient. Results given --continue taking every other day- digoxin.

## 2018-06-15 NOTE — Telephone Encounter (Signed)
Spoke with pt she will contact office because she is not sure if she has succinate or Tartrate.  She is not currently at home to verify her medications.

## 2018-06-15 NOTE — Telephone Encounter (Signed)
Pt called returning this office call for labs results please give her a call back.

## 2018-06-15 NOTE — Telephone Encounter (Signed)
Please advise if ok to refill Metoprolol 25 mg with updated instructions. Pt taking 1/2 tablet am and 1 tablet pm. Pt last Rx 1 tablet BID.

## 2018-06-16 ENCOUNTER — Other Ambulatory Visit: Payer: Self-pay

## 2018-06-16 NOTE — Telephone Encounter (Signed)
See note below of [place], [place].

## 2018-06-19 ENCOUNTER — Telehealth: Payer: Self-pay | Admitting: Cardiovascular Disease

## 2018-06-19 NOTE — Telephone Encounter (Signed)
I called and spoke with the patient as the refill dept had received a refill request for metoprolol tartrate 25 mg tablets. However the patient's chart was reading metoprolol succinate 50 mg tablets.  Lenda Kelp, CMA had spoken with the patient to clarify her refill. The patient advised her that she was taking: Metoprolol tartrate 25 mg once every morning & Metoprolol tartrate 50 mg once every evening  I called and spoke with [facility] and they report just having filled metoprolol tartrate 50 mg - take 1 tablet by mouth twice daily on 06/15/18 and the patient has picked this up.  They stated that a RX for metoprolol tatrate 25 mg BID originated in 08/2017 & a RX for metoprolol tartrate 50 mg BID originated in 01/2018.  I called and spoke with the patient. She confirms she picked up the metoprolol tratrate 50 mg RX. She reaffirmed that she is taking metoprolol tartrate 25 mg in the morning & 50 mg in the evening. I advised her that I am updating her chart now.

## 2018-06-19 NOTE — Telephone Encounter (Signed)
Pt mentioned that she has two separate Rx for Metoprolol Tartrate. Metoprolol Tartrate 25 mg 1 tablet am Metoprolol Tartrate 50 mg 1 tablet pm. She mentioned that she does not need the Metoprolol Tartrate 25 mg tablet refilled at this time. Please advise.

## 2018-07-03 ENCOUNTER — Telehealth: Payer: Self-pay | Admitting: Cardiovascular Disease

## 2018-07-03 NOTE — Telephone Encounter (Signed)
Pt c/o medication issue:  1. Name of Medication: Digoxin   2. How are you currently taking this medication (dosage and times per day)?  0.125 mg po q other day   3. Are you having a reaction (difficulty breathing--STAT)?  Patient has several side effects   4. What is your medication issue? Hoarseness dizziness blurry vision in left eye soreness and swelling in Left Temple  Tired weak  Change in mood memory loss and confusion    Todays BP  545 am  149/96 94 Took nitro      624 am  138/94  93      715 am 135/75  91      0800 am 123/70  97 After taking medication  PATIENT FEELS LIKE THIS IS URGENT AND WANTS TO WAIT IN THE LOBBY FOR A NURSE TO SPEAK WITH HER DIRECTLY

## 2018-07-03 NOTE — Telephone Encounter (Signed)
Vision on left eye wants to get blurry and side of head is "puffy". Seemed to feel better the day she did not take it. Stays tired all the time. Feels confused some of the time. She is experiencing hoarseness and dizziness. She does not feel she can continue taking the digoxin. She feels like these symptoms started when she started the digoxin. She does not want to continue taking the digoxin and wants to get off it as soon as she can. Advised I will route to Dr Fletcher Anon for advice.

## 2018-07-03 NOTE — Telephone Encounter (Signed)
Stop Digoxin. Continue other meds.

## 2018-07-03 NOTE — Telephone Encounter (Signed)
Patient verbalized understanding to stop digoxin and continue other medications.

## 2018-07-04 ENCOUNTER — Other Ambulatory Visit: Payer: Self-pay | Admitting: Internal Medicine

## 2018-08-17 ENCOUNTER — Encounter: Payer: Self-pay | Admitting: Cardiovascular Disease

## 2018-08-17 ENCOUNTER — Ambulatory Visit: Payer: Medicare Other | Admitting: Cardiovascular Disease

## 2018-08-17 VITALS — BP 110/66 | HR 89 | Ht 64.5 in | Wt 164.5 lb

## 2018-08-17 DIAGNOSIS — I739 Peripheral vascular disease, unspecified: Secondary | ICD-10-CM

## 2018-08-17 DIAGNOSIS — I482 Chronic atrial fibrillation: Secondary | ICD-10-CM | POA: Diagnosis not present

## 2018-08-17 DIAGNOSIS — I10 Essential (primary) hypertension: Secondary | ICD-10-CM | POA: Diagnosis not present

## 2018-08-17 DIAGNOSIS — I779 Disorder of arteries and arterioles, unspecified: Secondary | ICD-10-CM

## 2018-08-17 DIAGNOSIS — I4821 Permanent atrial fibrillation: Secondary | ICD-10-CM

## 2018-08-17 NOTE — Patient Instructions (Signed)
Medication Instructions: Your physician recommends that you continue on your current medications as directed. Please refer to the Current Medication list given to you today.  If you need a refill on your cardiac medications before your next appointment, please call your pharmacy.   Follow-Up: Your physician wants you to follow-up in 6 months with Dr. Arida. You will receive a reminder letter in the mail two months in advance. If you don't receive a letter, please call our office at [phone] to schedule this follow-up appointment.  Thank you for choosing [facility]!    

## 2018-08-17 NOTE — Progress Notes (Signed)
Cardiology Office Note   Date:  08/17/2018   ID:  [name] [name], DOB 11/07/1936, MRN [id number]  PCP:  [name], MD  Cardiologist:   [name], MD   Chief Complaint  Patient presents with  . other    3 month follow up. Meds reviewed by the pt. verbally. Pt. c/o rapid heart beats with having to take the metoprolol 50 mg one tablet three times a day.       History of Present Illness: [name] [name] is a 82 y.o. female who presents for a followup visit regarding permanent atrial fibrillation.   She has known history of  atrial fibrillation/flutter status post multiple cardioversions in the past (total of 7). 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 She had cardiac catheterization twice in the past without significant coronary artery disease.  Most recent cardioversion was in Feb of 2018. Echocardiogram in March, 2019 showed an EF of 55 to 60% with mildly dilated left atrium and minimal pulmonary hypertension. She is being treated with rate control at the present time.  Diltiazem was not effective.  We tried digoxin last time but she complained of side effects and it was stopped.  The dose of metoprolol was increased and currently she is taking 50 mg 3 times daily with reasonable control.  She denies any chest pain or shortness of breath.   Past Medical History:  Diagnosis Date  . Arthritis   . Atrial flutter ([place]) 02/2011   s/p cardioversion   . Cancer (HCC)    BREAST  . Chest pain    a. H/o cardiac cath x 2, last 2012 -->nl cors;  b. 12/2016 MV: EF 61%, small region of mild perfusion defect in the apical anteroseptal region c/w breast attenuation, no ischemia-->Low risk.  . CKD (chronic kidney disease), stage III ([place])   . Cystocele   . GERD (gastroesophageal reflux disease)   . Headache(784.0)    chronic  . Hyperlipidemia   . Hypertension   . Knee fracture   . Mobitz type 2 second degree atrioventricular block    a. felt  to be 2/2 amiodarone, resolved with decreased amiodarone dose.  Amio since d/c'd.  . Persistent atrial fibrillation (HCC)    a. status post multiple DCCVs; b. 2018 - eval for PVI but opted for rate control.  [name] PONV (postoperative nausea and vomiting)    oxycodone and codiene cause N/V   . Pre-syncope    a. In setting of dehydration and AKI in the past.  . 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 Resolved;  b. 08/2017 Echo: EF 50-55%, no rwma, mild MR, mildly to mod dil LA/RA.  [name] Venous insufficiency   . Vertigo     Past Surgical History:  Procedure Laterality Date  . ABDOMINAL HYSTERECTOMY  1990  . APPENDECTOMY    . AUGMENTATION MAMMAPLASTY Bilateral 1986   implants  . AUGMENTATION MAMMAPLASTY  1990  . AUGMENTATION MAMMAPLASTY  2011  . CARDIAC CATHETERIZATION    . CARDIOVERSION     x 3  . CARDIOVERSION    . CARDIOVERSION N/A 02/07/2017   Procedure: CARDIOVERSION;  Surgeon: Wellington Hampshire, MD;  Location: [facility];  Service: Cardiovascular;  Laterality: N/A;  . CARDIOVERSION N/A 07/22/2017   Procedure: Cardioversion;  Surgeon: Minna Merritts, MD;  Location: [facility];  Service: Cardiovascular;  Laterality: N/A;  . CATARACT EXTRACTION W/PHACO Right 09/21/2016   Procedure: CATARACT EXTRACTION PHACO AND INTRAOCULAR LENS  PLACEMENT (IOC);  Surgeon: Birder Robson, MD;  Location: [facility];  Service: Ophthalmology;  Laterality: Right;  [place] 44.1 AP% 16.5 CDE 7.30 Fluid Pack Lot #[id number]  . CATARACT EXTRACTION W/PHACO Left 10/19/2016   Procedure: CATARACT EXTRACTION PHACO AND INTRAOCULAR LENS PLACEMENT (IOC);  Surgeon: Birder Robson, MD;  Location: [facility];  Service: Ophthalmology;  Laterality: Left;  [place] 53.7 AP% 19.5 CDE 10.45 Fluid pack lot # [id number]  . CHOLECYSTECTOMY    . COMBINED AUGMENTATION MAMMAPLASTY AND ABDOMINOPLASTY    . JOINT REPLACEMENT Left 06/04/2013   left knee  . KNEE ARTHROSCOPY Right 08/16/2016   Procedure: ARTHROSCOPY KNEE, tear posterior horn medial  meniscus, tear anterior and posterior horns of lateral meniscus, chondromalacia of lateral compartment grade 3 patella and grade 4 medial;  Surgeon: Dereck Leep, MD;  Location: [facility];  Service: Orthopedics;  Laterality: Right;  . MASTECTOMY  1986   Bilateral with silicone  breast implants, s/p saline replacements  . Multiple orthopedic procedures    . NOSE SURGERY    . TEE WITHOUT CARDIOVERSION N/A 09/26/2017   Procedure: TRANSESOPHAGEAL ECHOCARDIOGRAM (TEE);  Surgeon: Fay Records, MD;  Location: [place];  Service: Cardiovascular;  Laterality: N/A;  . TOTAL KNEE ARTHROPLASTY Left      Current Outpatient Medications  Medication Sig Dispense Refill  . acetaminophen (TYLENOL) 500 MG tablet Take 1,000 mg by mouth every 6 (six) hours as needed (pain).     [name] aluminum-magnesium hydroxide 200-200 MG/5ML suspension Take 20 mLs by mouth every 6 (six) hours as needed for indigestion.     [name] amLODipine (NORVASC) 2.5 MG tablet Take 2.5 mg by mouth daily as needed.    . cholecalciferol (VITAMIN D) 1000 units tablet Take 1,000 Units by mouth daily.    . Coenzyme Q10 (COQ10 PO) Take by mouth daily.    [name] ELIQUIS 5 MG TABS tablet TAKE ONE TABLET BY MOUTH TWICE DAILY 60 tablet 3  . HYDROcodone-acetaminophen (NORCO/VICODIN) 5-325 MG tablet Take 1 tablet by mouth every 6 (six) hours as needed for moderate pain. 15 tablet 0  . metoprolol tartrate (LOPRESSOR) 50 MG tablet Take 50 mg by mouth 3 (three) times daily. Take 1 tablet (50 mg) by mouth once daily in the evening     . OVER THE COUNTER MEDICATION Saline nasal spray    . pantoprazole (PROTONIX) 40 MG tablet TAKE 1 TABLET BY MOUTH EVERY DAY 30 tablet 11  . pravastatin (PRAVACHOL) 20 MG tablet TAKE ONE TABLET BY MOUTH EVERY DAY 90 tablet 1  . Probiotic Product (PROBIOTIC ADVANCED PO) Take 1 tablet by mouth daily.     No current facility-administered medications for this visit.     Allergies:   Iodine; Biaxin [clarithromycin]; Codeine; Digoxin  and related; Enalapril; Fluocinonide; Iodinated diagnostic agents; Oxycodone; Promethazine; Tizanidine hcl; Warfarin and related; and Xarelto [rivaroxaban]    Social History:  The patient  reports that she has never smoked. She has never used smokeless tobacco. She reports that she does not drink alcohol or use drugs.   Family History:  The patient's family history includes Breast cancer in her maternal grandmother, sister, sister, and sister; Cancer in her father, maternal grandmother, sister, sister, and sister; Diabetes in her brother; Heart disease in her mother and son.    ROS:  Please see the history of present illness.   Otherwise, review of systems are positive for none.   All other systems are reviewed and negative.    PHYSICAL EXAM: VS:  BP  110/66 (BP Location: Left Arm, Patient Position: Sitting, Cuff Size: Normal)   Pulse 89   Ht 5' 4.5" (1.638 m)   Wt 164 lb 8 oz (74.6 kg)   BMI 27.80 kg/m  , BMI Body mass index is 27.8 kg/m. GEN: Well nourished, well developed, in no acute distress  HEENT: normal  Neck: no JVD, carotid bruits, or masses Cardiac: Irregularly irregular; no murmurs, rubs, or gallops,no edema  Respiratory:  clear to auscultation bilaterally, normal work of breathing GI: soft, nontender, nondistended, + BS MS: no deformity or atrophy  Skin: warm and dry, no rash Neuro:  Strength and sensation are intact Psych: euthymic mood, full affect   EKG:  EKG is ordered today. The ekg ordered today demonstrates atrial fibrillation with right bundle branch block.  Inferolateral T wave changes suggestive of ischemia.  Recent Labs: 10/11/2017: ALT 18 03/03/2018: Hemoglobin 13.2; Platelets 261 05/26/2018: BUN 18; Creatinine, Ser 0.90; Potassium 5.0; Sodium 139    Lipid Panel    Component Value Date/Time   CHOL 147 07/22/2017 1332   TRIG 51 07/22/2017 1332   HDL 63 07/22/2017 1332   CHOLHDL 2.3 07/22/2017 1332   VLDL 10 07/22/2017 1332   LDLCALC 74 07/22/2017  1332      Wt Readings from Last 3 Encounters:  08/17/18 164 lb 8 oz (74.6 kg)  05/12/18 164 lb (74.4 kg)  03/13/18 162 lb 12.8 oz (73.8 kg)       ASSESSMENT AND PLAN:  1.  Permanent atrial fibrillation: Ventricular rate is reasonably controlled on current dose of metoprolol 50 mg 3 times daily.   We stopped digoxin due to side effects. She is tolerating anticoagulation with Eliquis with no side effects.  Recent labs were unremarkable  2. Essential hypertension: Blood pressure is well controlled.  3. History of tachycardia-induced cardiomyopathy. Most recent ejection fraction was 55%.  4. Left carotid calcifications: Carotid Doppler in August of 2018 showed mild nonobstructive disease.  Disposition:   FU with me in 6 months  Signed,  [name], MD  08/17/2018 11:01 AM    [place]

## 2018-08-22 ENCOUNTER — Telehealth: Payer: Self-pay | Admitting: Cardiovascular Disease

## 2018-08-22 MED ORDER — METOPROLOL TARTRATE 50 MG PO TABS: 50.0000 mg | ORAL_TABLET | Freq: Three times a day (TID) | ORAL | 3 refills | Status: DC

## 2018-08-22 NOTE — Telephone Encounter (Signed)
°*  STAT* If patient is at the pharmacy, call can be transferred to refill team.   1. Which medications need to be refilled? (please list name of each medication and dose if known)  Metoprolol 50 mg po TID   2. Which pharmacy/location (including street and city if local pharmacy) is medication to be sent to? [facility]   3. Do they need a 30 day or 90 day supply?  [place]

## 2018-08-24 DIAGNOSIS — H6121 Impacted cerumen, right ear: Secondary | ICD-10-CM | POA: Diagnosis not present

## 2018-08-24 DIAGNOSIS — H902 Conductive hearing loss, unspecified: Secondary | ICD-10-CM | POA: Diagnosis not present

## 2018-09-15 ENCOUNTER — Other Ambulatory Visit: Payer: Self-pay | Admitting: Cardiovascular Disease

## 2018-09-15 NOTE — Telephone Encounter (Signed)
Please review for refill. Thanks!  

## 2018-10-03 ENCOUNTER — Other Ambulatory Visit: Payer: Self-pay | Admitting: Internal Medicine

## 2018-10-03 DIAGNOSIS — Z1231 Encounter for screening mammogram for malignant neoplasm of breast: Secondary | ICD-10-CM

## 2018-10-12 ENCOUNTER — Telehealth: Payer: Self-pay | Admitting: Cardiovascular Disease

## 2018-10-12 NOTE — Telephone Encounter (Signed)
Pt c/o BP issue: STAT if pt c/o blurred vision, one-sided weakness or slurred speech  1. What are your last 5 BP readings? No   2. Are you having any other symptoms (ex. Dizziness, headache, blurred vision, passed out)? Denies symptoms HR elevated to 109 sometimes   3. What is your BP issue? Sporadically elevated or low patient declined to give or have readings patient wants to only speak with Dr. Fletcher Anon a phone call will not help patient wants ov with Fletcher Anon only work in asap

## 2018-10-12 NOTE — Telephone Encounter (Signed)
Left a message for the patient to call back.  

## 2018-10-12 NOTE — Telephone Encounter (Signed)
Pt is returning your call

## 2018-10-13 MED ORDER — METOPROLOL TARTRATE 50 MG PO TABS: 100.0000 mg | ORAL_TABLET | Freq: Two times a day (BID) | ORAL | 3 refills | Status: DC

## 2018-10-13 NOTE — Telephone Encounter (Signed)
Pt c/o medication issue:  1. Name of Medication: Metroprolol 50 MG   2. How are you currently taking this medication (dosage and times per day)? 1 tablet 3 times a day  3. Are you having a reaction (difficulty breathing--STAT)? Heart rate has been ~105 in morning   4. What is your medication issue? Patient would like to know if she can take 4 a day instead.  Patient states she takes 1 in the morning, 1 at lunch and then 1 at supper - that leaves a long time in between before taking again in the morning.  Patient does eat supper early and then does not get to bed til about 10p. Patient is scheduled to see Dr. Fletcher Anon on 11/26, would like to know if she can be seen sooner.  Please call to discuss, if unable to reach ok to leave a detailed message.

## 2018-10-13 NOTE — Telephone Encounter (Signed)
Spoke with pt. Pt sts that she has been instructed by Dr.Arida to take Metoprolol 50mg  TID. She take 1 tab in the morning, 1 tab mid day, and 1 tab at dinner. When she wakes up each morning her heart rate has been well above 100. She would like to know if she can go up on her Metoprolol.  Adv pt that Dr.Arida and his nurse Lattie Haw is out of the office today. I will discuss her symptoms with the DOD Dr.Jordan and call her back with his recommendations.   Pt aware of Dr.Jordans recommendation to increase Metoprolol to 100mg  bid. She has taken 50mg  this morning. Adv her to go ahead and take another 50mg  now , and to take 100mg  this evening. Adv pt that I will fwd an update to [place]. Pt agreeable with plan and voiced appreciation for the assistance.

## 2018-10-19 ENCOUNTER — Other Ambulatory Visit: Payer: Self-pay | Admitting: Cardiovascular Disease

## 2018-10-19 NOTE — Telephone Encounter (Signed)
Left message need to clarify how she is taking her Amlodipine 2.5.

## 2018-10-19 NOTE — Telephone Encounter (Signed)
Left message to call back  

## 2018-10-19 NOTE — Telephone Encounter (Signed)
Patient returning call.

## 2018-10-19 NOTE — Telephone Encounter (Signed)
Patient returning call States that she takes Amlodipine by how she needs it day by day Bottle states take 1 2.5 MG tablet every morning and 2 tablets every evening But when patient takes Metoprolol 50 MG and her BP and HR gets low she does not take the amlodipine because it will only make it lower States Dr. Fletcher Anon is aware of this and says it is ok Please call with any further questions

## 2018-10-24 ENCOUNTER — Ambulatory Visit
Admission: RE | Admit: 2018-10-24 | Discharge: 2018-10-24 | Disposition: A | Discharge status: Home / Self Care | Payer: Medicare Other | Source: Ambulatory Visit | Attending: Internal Medicine | Admitting: Internal Medicine

## 2018-10-24 DIAGNOSIS — Z1231 Encounter for screening mammogram for malignant neoplasm of breast: Secondary | ICD-10-CM | POA: Diagnosis not present

## 2018-10-25 NOTE — Telephone Encounter (Signed)
Spoke with patient and reviewed medication changes and heart rates. She states that she did not tolerate the metoprolol 100 mg twice a day and her blood pressures were too low. She states her blood pressure this morning was 132/98 with heart rate 108. She states that she is going back to her metoprolol 50 mg three times a day until she comes in to see Dr. Fletcher Anon. Instructed her to monitor her blood pressures and heart rates and to please call us if she should have any concerns before her upcoming appointment. Confirmed date and time of that appointment with her and she verbalized understanding of our conversation with no further questions at this time.

## 2018-11-01 ENCOUNTER — Ambulatory Visit (INDEPENDENT_AMBULATORY_CARE_PROVIDER_SITE_OTHER): Payer: Medicare Other | Admitting: Obstetrics and Gynecology

## 2018-11-01 VITALS — BP 115/79 | Ht 64.5 in | Wt 165.8 lb

## 2018-11-01 DIAGNOSIS — Z01419 Encounter for gynecological examination (general) (routine) without abnormal findings: Secondary | ICD-10-CM

## 2018-11-01 DIAGNOSIS — Z90722 Acquired absence of ovaries, bilateral: Secondary | ICD-10-CM

## 2018-11-01 DIAGNOSIS — Z90710 Acquired absence of both cervix and uterus: Secondary | ICD-10-CM

## 2018-11-01 DIAGNOSIS — Z9079 Acquired absence of other genital organ(s): Secondary | ICD-10-CM

## 2018-11-01 DIAGNOSIS — Z780 Asymptomatic menopausal state: Secondary | ICD-10-CM

## 2018-11-01 DIAGNOSIS — N952 Postmenopausal atrophic vaginitis: Secondary | ICD-10-CM

## 2018-11-01 NOTE — Progress Notes (Signed)
reportPatient ID: [name] [name], female   DOB: [birth year], 82 y.o.   MRN: [id number] ANNUAL PREVENTATIVE CARE GYN  ENCOUNTER NOTE  Subjective:       [name] [name] is a 82 y.o. G13P2002 female here for a routine annual gynecologic exam.  Patient reports mild increase in fatigue and some weight gain that she attributes to her knee surgery. She has no current gynecological complaints.  Current complaints: 1. None   Bowel and bladder function are normal. Due to age and infirmity of spouse, she is no longer sexually active. Mammogram has already been obtained this year.  Ongoing health issues include chronic A. fib. Husband was recently evaluated and treated for C. difficile.   Gynecologic History No LMP recorded. Patient has had a hysterectomy. Status post TAH/BSO  Contraception: status post hysterectomy Last Pap: unsure. Results were: normal Last mammogram: 10/24/2018 wnl. Results were: Birads 1 normal  Obstetric History OB History  Gravida Para Term Preterm AB Living  2 2 2     2   SAB TAB Ectopic Multiple Live Births          2    # Outcome Date GA Lbr Len/2nd Weight Sex Delivery Anes PTL Lv  2 Term 1964    M VBAC   LIV  1 Term 1962    M Vag-Spont   LIV    Past Medical History:  Diagnosis Date  . Arthritis   . Atrial flutter ([place]) 02/2011   s/p cardioversion   . Chest pain    a. H/o cardiac cath x 2, last 2012 -->nl cors;  b. 12/2016 MV: EF 61%, small region of mild perfusion defect in the apical anteroseptal region c/w breast attenuation, no ischemia-->Low risk.  . CKD (chronic kidney disease), stage III ([place])   . Cystocele   . GERD (gastroesophageal reflux disease)   . Headache(784.0)    chronic  . Hyperlipidemia   . Hypertension   . Knee fracture   . Mobitz type 2 second degree atrioventricular block    a. felt to be 2/2 amiodarone, resolved with decreased amiodarone dose.  Amio since d/c'd.  . Persistent atrial fibrillation    a. status post multiple DCCVs; b.  2018 - eval for PVI but opted for rate control.  [name] PONV (postoperative nausea and vomiting)    oxycodone and codiene cause N/V   . Pre-syncope    a. In setting of dehydration and AKI in the past.  . Tachycardia induced cardiomyopathy ([place])    a. Resolved;  b. 08/2017 Echo: EF 50-55%, no rwma, mild MR, mildly to mod dil LA/RA.  [name] Venous insufficiency   . Vertigo     Past Surgical History:  Procedure Laterality Date  . ABDOMINAL HYSTERECTOMY  1990  . APPENDECTOMY    . AUGMENTATION MAMMAPLASTY Bilateral 1986   implants  . AUGMENTATION MAMMAPLASTY  1990  . AUGMENTATION MAMMAPLASTY  2011  . CARDIAC CATHETERIZATION    . CARDIOVERSION     x 3  . CARDIOVERSION    . CARDIOVERSION N/A 02/07/2017   Procedure: CARDIOVERSION;  Surgeon: Wellington Hampshire, MD;  Location: [facility] ORS;  Service: Cardiovascular;  Laterality: N/A;  . CARDIOVERSION N/A 07/22/2017   Procedure: Cardioversion;  Surgeon: Minna Merritts, MD;  Location: [facility] OR[facility];  Service: Cardiovascular;  Laterality: N/A;  . CATARACT EXTRACTION W/PHACO Right 09/21/2016   Procedure: CATARACT EXTRACTION PHACO AND INTRAOCULAR LENS PLACEMENT ([place]);  Surgeon: Birder Robson, MD;  Location: [facility] OR[facility];  Service: Ophthalmology;  Laterality: Right;  [place] 44.1 AP% 16.5 CDE 7.30 Fluid Pack Lot #[id number]  . CATARACT EXTRACTION W/PHACO Left 10/19/2016   Procedure: CATARACT EXTRACTION PHACO AND INTRAOCULAR LENS PLACEMENT (IOC);  Surgeon: Birder Robson, MD;  Location: [facility];  Service: Ophthalmology;  Laterality: Left;  [place] 53.7 AP% 19.5 CDE 10.45 Fluid pack lot # [id number]  . CHOLECYSTECTOMY    . COMBINED AUGMENTATION MAMMAPLASTY AND ABDOMINOPLASTY    . JOINT REPLACEMENT Left 06/04/2013   left knee  . KNEE ARTHROSCOPY Right 08/16/2016   Procedure: ARTHROSCOPY KNEE, tear posterior horn medial meniscus, tear anterior and posterior horns of lateral meniscus, chondromalacia of lateral compartment grade 3 patella and grade 4 medial;  Surgeon: Dereck Leep, MD;  Location: [facility];  Service: Orthopedics;  Laterality: Right;  . MASTECTOMY  1986   nipple sparing mastectomy/Bilateral with silicone  breast implants, s/p saline replacements  . Multiple orthopedic procedures    . NOSE SURGERY    . TEE WITHOUT CARDIOVERSION N/A 09/26/2017   Procedure: TRANSESOPHAGEAL ECHOCARDIOGRAM (TEE);  Surgeon: Fay Records, MD;  [facility]: [facility] ENDOSCOPY;  Service: Cardiovascular;  Laterality: N/A;  . TOTAL KNEE ARTHROPLASTY Left     Current Outpatient Medications on File Prior to Visit  Medication Sig Dispense Refill  . acetaminophen (TYLENOL) 500 MG tablet Take 1,000 mg by mouth every 6 (six) hours as needed (pain).     [name] aluminum-magnesium hydroxide 200-200 MG/5ML suspension Take 20 mLs by mouth every 6 (six) hours as needed for indigestion.     [name] amLODipine (NORVASC) 2.5 MG tablet Take 2.5 mg by mouth daily as needed.    [name] amLODipine (NORVASC) 2.5 MG tablet TAKE ONE TABLET EVERY MORNING AND TAKE TWO TABLETS EVERY EVENING 270 tablet 1  . cholecalciferol (VITAMIN D) 1000 units tablet Take 1,000 Units by mouth daily.    . Coenzyme Q10 (COQ10 PO) Take by mouth daily.    [name] ELIQUIS 5 MG TABS tablet TAKE 1 TABLET BY MOUTH TWICE DAILY 60 tablet 3  . HYDROcodone-acetaminophen (NORCO/VICODIN) 5-325 MG tablet Take 1 tablet by mouth every 6 (six) hours as needed for moderate pain. 15 tablet 0  . metoprolol tartrate (LOPRESSOR) 50 MG tablet Take 2 tablets (100 mg total) by mouth 2 (two) times daily. 270 tablet 3  . OVER THE COUNTER MEDICATION Saline nasal spray    . pantoprazole (PROTONIX) 40 MG tablet TAKE 1 TABLET BY MOUTH EVERY DAY 30 tablet 11  . pravastatin (PRAVACHOL) 20 MG tablet TAKE ONE TABLET BY MOUTH EVERY DAY 90 tablet 1  . Probiotic Product (PROBIOTIC ADVANCED PO) Take 1 tablet by mouth daily.     No current facility-administered medications on file prior to visit.     Allergies  Allergen Reactions  . Iodine Anaphylaxis    NO PROBLEMS WITH  BETADINE  . Biaxin [Clarithromycin] Nausea And Vomiting  . Codeine Nausea And Vomiting  . Digoxin And Related Other (See Comments)    Fatigue, eye puffiness, hoarsness  . Enalapril Other (See Comments)    unknown  . Fluocinonide Other (See Comments)    Tingling sensation in head and redness to scalp.  . Iodinated Diagnostic Agents Other (See Comments)    Tachycardia  . Oxycodone Nausea And Vomiting  . Promethazine Other (See Comments)    Unknown   . Tizanidine Hcl Other (See Comments)    hypotension hypotension  . Warfarin And Related   . Xarelto [Rivaroxaban] Other (See Comments)    Unknown  Social History   Socioeconomic History  . Marital status: Married    Spouse name: Not on file  . Number of children: 1  . Years of education: Not on file  . Highest education level: Not on file  Occupational History    Employer: RETIRED  Social Needs  . Financial resource strain: Not on file  . Food insecurity:    Worry: Not on file    Inability: Not on file  . Transportation needs:    Medical: Not on file    Non-medical: Not on file  Tobacco Use  . Smoking status: Never Smoker  . Smokeless tobacco: Never Used  Substance and Sexual Activity  . Alcohol use: No  . Drug use: No  . Sexual activity: Never    Birth control/protection: Surgical  Lifestyle  . Physical activity:    Days per week: Not on file    Minutes per session: Not on file  . Stress: Not on file  Relationships  . Social connections:    Talks on phone: Not on file    Gets together: Not on file    Attends religious service: Not on file    Active member of club or organization: Not on file    Attends meetings of clubs or organizations: Not on file    Relationship status: Not on file  . Intimate partner violence:    Fear of current or ex partner: Not on file    Emotionally abused: Not on file    Physically abused: Not on file    Forced sexual activity: Not on file  Other Topics Concern  . Not on  file  Social History Narrative   Married     Family History  Problem Relation Age of Onset  . Heart disease Mother   . Cancer Father        stomach  . Heart disease Son        found at autopsy  . Cancer Sister        breast  . Breast cancer Sister   . Cancer Maternal Grandmother        breast  . Breast cancer Maternal Grandmother   . Cancer Sister        breast  . Breast cancer Sister   . Breast cancer Sister   . Cancer Sister        breast  . Diabetes Brother   . Ovarian cancer Neg Hx     The following portions of the patient's history were reviewed and updated as appropriate: allergies, current medications, past family history, past medical history, past social history, past surgical history and problem list.  Review of Systems Review of Systems  Constitutional: Negative.   Respiratory: Negative.   Cardiovascular:       Chronic A. fib  Gastrointestinal: Negative.   Genitourinary: Negative.   Musculoskeletal: Negative.   Skin: Negative.   Neurological: Negative.   Psychiatric/Behavioral: Negative.   All other systems reviewed and are negative.    Objective:   BP 115/79   Ht 5' 4.5" (1.638 m)   Wt 165 lb 12.8 oz (75.2 kg)   BMI 28.02 kg/m  CONSTITUTIONAL: Well-developed, well-nourished female in no acute distress.  PSYCHIATRIC: Normal mood and affect. Normal behavior. Normal judgment and thought content. [place]: Alert and oriented to person, place, and time. Normal muscle tone coordination. No cranial nerve deficit noted. HENT:  Normocephalic, atraumatic, External right and left ear normal.  EYES: Conjunctivae and EOM are normal. No  scleral icterus.  NECK: Normal range of motion, supple, no masses.  Normal thyroid.  SKIN: Skin is warm and dry. No rash noted. Not diaphoretic. No erythema. No pallor. CARDIOVASCULAR: Normal heart rate noted, irregular rhythm, no murmur. RESPIRATORY: Clear to auscultation bilaterally. Effort and breath sounds normal, no  problems with respiration noted. BREASTS: Symmetric in size. No masses, skin changes, nipple drainage, or lymphadenopathy.  Post augmentation mammoplasty scarring is healed; breast implants present ABDOMEN: Soft, no distention noted.  Mild R sided tenderness, no rebound or guarding. Midline incision well-healed; appendectomy incision well-healed.  No hernias BLADDER: Normal PELVIC:  External Genitalia: Normal; Moderate atrophic changes.  BUS: Normal  Vagina: Normal; moderate atrophy; vaginal cuff intact and nontender  Cervix:Surgically absent  Uterus:Surgically absent  Adnexa: Normal  RV: External hemorrhoids nonthrombosed, No Rectal Masses and Normal Sphincter tone  MUSCULOSKELETAL: Normal range of motion. No tenderness.  No cyanosis, clubbing, or edema.  2+ distal pulses. LYMPHATIC: No Axillary, Supraclavicular, or Inguinal Adenopathy. SKIN: warm and dry, no rashes;  Left anterior thigh 1cm raised nodule with dark, increased black pigmentation and scaly appearance. Multiple seborrheic keratosis on back and thorax . Patient advised to start her abdomen increased black pigmentation  Assessment:   Annual gynecologic examination 82 y.o. Contraception: status post hysterectomy bmi 30 Menopause, asymptomatic Vaginal atrophy, mild, managed with estrogen cream    Plan:  Pap: Not needed Mammogram: thru pcp Stool Guaiac Testing:  PCP Labs: thru pcp  Continue with calcium and vitamin D supplementation daily Routine preventative health maintenance measures emphasized: Exercise/Diet/Weight control, Tobacco Warnings and Alcohol/Substance use risks  Return to Clinic - 1 Year   Durwin Glaze, CMA  Brayton Mars, MD   Note: This dictation was prepared with Dragon dictation along with smaller phrase technology. Any transcriptional errors that result from this process are unintentional.

## 2018-11-01 NOTE — Patient Instructions (Addendum)
1.  Pap smear is not done.  No further Pap smears are needed. 2.  Mammogram is already been done this year and was normal; she does get mammograms through primary care. 3.  Stool guaiac card testing is done through primary care. 4.  Screening labs are obtained through primary care. 5.  Patient is to continue with healthy eating and exercise. 6.  Continue with calcium and vitamin D supplementation twice a day 7.  Return in 1 year for annual exam-Dr. Byars Maintenance for Postmenopausal Women Menopause is a normal process in which your reproductive ability comes to an end. This process happens gradually over a span of months to years, usually between the ages of 76 and 35. Menopause is complete when you have missed 12 consecutive menstrual periods. It is important to talk with your health care provider about some of the most common conditions that affect postmenopausal women, such as heart disease, cancer, and bone loss (osteoporosis). Adopting a healthy lifestyle and getting preventive care can help to promote your health and wellness. Those actions can also lower your chances of developing some of these common conditions. What should I know about menopause? During menopause, you may experience a number of symptoms, such as:  Moderate-to-severe hot flashes.  Night sweats.  Decrease in sex drive.  Mood swings.  Headaches.  Tiredness.  Irritability.  Memory problems.  Insomnia.  Choosing to treat or not to treat menopausal changes is an individual decision that you make with your health care provider. What should I know about hormone replacement therapy and supplements? Hormone therapy products are effective for treating symptoms that are associated with menopause, such as hot flashes and night sweats. Hormone replacement carries certain risks, especially as you become older. If you are thinking about using estrogen or estrogen with progestin treatments, discuss the benefits  and risks with your health care provider. What should I know about heart disease and stroke? Heart disease, heart attack, and stroke become more likely as you age. This may be due, in part, to the hormonal changes that your body experiences during menopause. These can affect how your body processes dietary fats, triglycerides, and cholesterol. Heart attack and stroke are both medical emergencies. There are many things that you can do to help prevent heart disease and stroke:  Have your blood pressure checked at least every 1-2 years. High blood pressure causes heart disease and increases the risk of stroke.  If you are 65-28 years old, ask your health care provider if you should take aspirin to prevent a heart attack or a stroke.  Do not use any tobacco products, including cigarettes, chewing tobacco, or electronic cigarettes. If you need help quitting, ask your health care provider.  It is important to eat a healthy diet and maintain a healthy weight. ? Be sure to include plenty of vegetables, fruits, low-fat dairy products, and lean protein. ? Avoid eating foods that are high in solid fats, added sugars, or salt (sodium).  Get regular exercise. This is one of the most important things that you can do for your health. ? Try to exercise for at least 150 minutes each week. The type of exercise that you do should increase your heart rate and make you sweat. This is known as moderate-intensity exercise. ? Try to do strengthening exercises at least twice each week. Do these in addition to the moderate-intensity exercise.  Know your numbers.Ask your health care provider to check your cholesterol and your blood glucose.  Continue to have your blood tested as directed by your health care provider.  What should I know about cancer screening? There are several types of cancer. Take the following steps to reduce your risk and to catch any cancer development as early as possible. Breast  Cancer  Practice breast self-awareness. ? This means understanding how your breasts normally appear and feel. ? It also means doing regular breast self-exams. Let your health care provider know about any changes, no matter how small.  If you are 47 or older, have a clinician do a breast exam (clinical breast exam or CBE) every year. Depending on your age, family history, and medical history, it may be recommended that you also have a yearly breast X-ray (mammogram).  If you have a family history of breast cancer, talk with your health care provider about genetic screening.  If you are at high risk for breast cancer, talk with your health care provider about having an MRI and a mammogram every year.  Breast cancer (BRCA) gene test is recommended for women who have family members with BRCA-related cancers. Results of the assessment will determine the need for genetic counseling and BRCA1 and for BRCA2 testing. BRCA-related cancers include these types: ? Breast. This occurs in males or females. ? Ovarian. ? Tubal. This may also be called fallopian tube cancer. ? Cancer of the abdominal or pelvic lining (peritoneal cancer). ? Prostate. ? Pancreatic.  Cervical, Uterine, and Ovarian Cancer Your health care provider may recommend that you be screened regularly for cancer of the pelvic organs. These include your ovaries, uterus, and vagina. This screening involves a pelvic exam, which includes checking for microscopic changes to the surface of your cervix (Pap test).  For women ages 21-65, health care providers may recommend a pelvic exam and a Pap test every three years. For women ages 80-65, they may recommend the Pap test and pelvic exam, combined with testing for human papilloma virus (HPV), every five years. Some types of HPV increase your risk of cervical cancer. Testing for HPV may also be done on women of any age who have unclear Pap test results.  Other health care providers may not  recommend any screening for nonpregnant women who are considered low risk for pelvic cancer and have no symptoms. Ask your health care provider if a screening pelvic exam is right for you.  If you have had past treatment for cervical cancer or a condition that could lead to cancer, you need Pap tests and screening for cancer for at least 20 years after your treatment. If Pap tests have been discontinued for you, your risk factors (such as having a new sexual partner) need to be reassessed to determine if you should start having screenings again. Some women have medical problems that increase the chance of getting cervical cancer. In these cases, your health care provider may recommend that you have screening and Pap tests more often.  If you have a family history of uterine cancer or ovarian cancer, talk with your health care provider about genetic screening.  If you have vaginal bleeding after reaching menopause, tell your health care provider.  There are currently no reliable tests available to screen for ovarian cancer.  Lung Cancer Lung cancer screening is recommended for adults 69-43 years old who are at high risk for lung cancer because of a history of smoking. A yearly low-dose CT scan of the lungs is recommended if you:  Currently smoke.  Have a history of at least  30 pack-years of smoking and you currently smoke or have quit within the past 15 years. A pack-year is smoking an average of one pack of cigarettes per day for one year.  Yearly screening should:  Continue until it has been 15 years since you quit.  Stop if you develop a health problem that would prevent you from having lung cancer treatment.  Colorectal Cancer  This type of cancer can be detected and can often be prevented.  Routine colorectal cancer screening usually begins at age 54 and continues through age 22.  If you have risk factors for colon cancer, your health care provider may recommend that you be screened  at an earlier age.  If you have a family history of colorectal cancer, talk with your health care provider about genetic screening.  Your health care provider may also recommend using home test kits to check for hidden blood in your stool.  A small camera at the end of a tube can be used to examine your colon directly (sigmoidoscopy or colonoscopy). This is done to check for the earliest forms of colorectal cancer.  Direct examination of the colon should be repeated every 5-10 years until age 53. However, if early forms of precancerous polyps or small growths are found or if you have a family history or genetic risk for colorectal cancer, you may need to be screened more often.  Skin Cancer  Check your skin from head to toe regularly.  Monitor any moles. Be sure to tell your health care provider: ? About any new moles or changes in moles, especially if there is a change in a mole's shape or color. ? If you have a mole that is larger than the size of a pencil eraser.  If any of your family members has a history of skin cancer, especially at a young age, talk with your health care provider about genetic screening.  Always use sunscreen. Apply sunscreen liberally and repeatedly throughout the day.  Whenever you are outside, protect yourself by wearing long sleeves, pants, a wide-brimmed hat, and sunglasses.  What should I know about osteoporosis? Osteoporosis is a condition in which bone destruction happens more quickly than new bone creation. After menopause, you may be at an increased risk for osteoporosis. To help prevent osteoporosis or the bone fractures that can happen because of osteoporosis, the following is recommended:  If you are 56-60 years old, get at least 1,000 mg of calcium and at least 600 mg of vitamin D per day.  If you are older than age 55 but younger than age 75, get at least 1,200 mg of calcium and at least 600 mg of vitamin D per day.  If you are older than age 45,  get at least 1,200 mg of calcium and at least 800 mg of vitamin D per day.  Smoking and excessive alcohol intake increase the risk of osteoporosis. Eat foods that are rich in calcium and vitamin D, and do weight-bearing exercises several times each week as directed by your health care provider. What should I know about how menopause affects my mental health? Depression may occur at any age, but it is more common as you become older. Common symptoms of depression include:  Low or sad mood.  Changes in sleep patterns.  Changes in appetite or eating patterns.  Feeling an overall lack of motivation or enjoyment of activities that you previously enjoyed.  Frequent crying spells.  Talk with your health care provider if you think  that you are experiencing depression. What should I know about immunizations? It is important that you get and maintain your immunizations. These include:  Tetanus, diphtheria, and pertussis (Tdap) booster vaccine.  Influenza every year before the flu season begins.  Pneumonia vaccine.  Shingles vaccine.  Your health care provider may also recommend other immunizations. This information is not intended to replace advice given to you by your health care provider. Make sure you discuss any questions you have with your health care provider. Document Released: 01/28/2006 Document Revised: 06/25/2016 Document Reviewed: 09/09/2015 [employer]vier Interactive Patient Education  2018 [employer]  

## 2018-11-13 DIAGNOSIS — N183 Chronic kidney disease, stage 3 (moderate): Secondary | ICD-10-CM | POA: Diagnosis not present

## 2018-11-13 DIAGNOSIS — R8290 Unspecified abnormal findings in urine: Secondary | ICD-10-CM | POA: Diagnosis not present

## 2018-11-13 DIAGNOSIS — E7800 Pure hypercholesterolemia, unspecified: Secondary | ICD-10-CM | POA: Diagnosis not present

## 2018-11-13 DIAGNOSIS — N2581 Secondary hyperparathyroidism of renal origin: Secondary | ICD-10-CM | POA: Diagnosis not present

## 2018-11-13 DIAGNOSIS — I10 Essential (primary) hypertension: Secondary | ICD-10-CM | POA: Diagnosis not present

## 2018-11-14 ENCOUNTER — Encounter: Payer: Self-pay | Admitting: Cardiovascular Disease

## 2018-11-14 ENCOUNTER — Ambulatory Visit: Payer: Medicare Other | Admitting: Cardiovascular Disease

## 2018-11-14 VITALS — BP 122/70 | HR 81 | Ht 64.5 in | Wt 165.5 lb

## 2018-11-14 DIAGNOSIS — I739 Peripheral vascular disease, unspecified: Secondary | ICD-10-CM | POA: Diagnosis not present

## 2018-11-14 DIAGNOSIS — R000 Tachycardia, unspecified: Secondary | ICD-10-CM | POA: Diagnosis not present

## 2018-11-14 DIAGNOSIS — I4821 Permanent atrial fibrillation: Secondary | ICD-10-CM

## 2018-11-14 DIAGNOSIS — I43 Cardiomyopathy in diseases classified elsewhere: Secondary | ICD-10-CM

## 2018-11-14 DIAGNOSIS — I779 Disorder of arteries and arterioles, unspecified: Secondary | ICD-10-CM

## 2018-11-14 DIAGNOSIS — I10 Essential (primary) hypertension: Secondary | ICD-10-CM | POA: Diagnosis not present

## 2018-11-14 MED ORDER — DILTIAZEM HCL ER COATED BEADS 120 MG PO CP24: 120.0000 mg | ORAL_CAPSULE | Freq: Every evening | ORAL | 0 refills | Status: DC

## 2018-11-14 NOTE — Progress Notes (Signed)
Cardiology Office Note   Date:  11/14/2018   ID:  [name], DOB [birth year], MRN [id number]  PCP:  [name] Mc, MD  Cardiologist:   [name], MD   Chief Complaint  Patient presents with  . other    Pt. c/o rapid heart beats & elevated BP. Meds reviewed by the pt. verbally.       History of Present Illness: [name] [name] is a 82 y.o. female who presents for a followup visit regarding permanent atrial fibrillation.   She has known history of  atrial fibrillation/flutter status post multiple cardioversions in the past (total of 7). 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 She had cardiac catheterization twice in the past without significant coronary artery disease.  Most recent cardioversion was in Feb of 2018. Echocardiogram in March, 2019 showed an EF of 55 to 60% with mildly dilated left atrium and minimal pulmonary hypertension. She is being treated with rate control at the present time.  Diltiazem was not effective.  We tried digoxin last time but she complained of side effects and it was stopped.   Most recent echocardiogram in March of this year showed an EF of 55 to 60%.  She has been doing reasonably well but reports high heart rate readings at night.  This is associated with fatigue.  No chest pain.   Past Medical History:  Diagnosis Date  . Arthritis   . Atrial flutter ([place]) 02/2011   s/p cardioversion   . Chest pain    a. H/o cardiac cath x 2, last 2012 -->nl cors;  b. 12/2016 MV: EF 61%, small region of mild perfusion defect in the apical anteroseptal region c/w breast attenuation, no ischemia-->Low risk.  . CKD (chronic kidney disease), stage III ([place])   . Cystocele   . GERD (gastroesophageal reflux disease)   . Headache(784.0)    chronic  . Hyperlipidemia   . Hypertension   . Knee fracture   . Mobitz type 2 second degree atrioventricular block    a. felt to be 2/2 amiodarone, resolved with decreased  amiodarone dose.  Amio since d/c'd.  . Persistent atrial fibrillation    a. status post multiple DCCVs; b. 2018 - eval for PVI but opted for rate control.  [name] PONV (postoperative nausea and vomiting)    oxycodone and codiene cause N/V   . Pre-syncope    a. In setting of dehydration and AKI in the past.  . Tachycardia induced cardiomyopathy ([place])    a. Resolved;  b. 08/2017 Echo: EF 50-55%, no rwma, mild MR, mildly to mod dil LA/RA.  [name] Venous insufficiency   . Vertigo     Past Surgical History:  Procedure Laterality Date  . ABDOMINAL HYSTERECTOMY  1990  . APPENDECTOMY    . AUGMENTATION MAMMAPLASTY Bilateral 1986   implants  . AUGMENTATION MAMMAPLASTY  1990  . AUGMENTATION MAMMAPLASTY  2011  . CARDIAC CATHETERIZATION    . CARDIOVERSION     x 3  . CARDIOVERSION    . CARDIOVERSION N/A 02/07/2017   Procedure: CARDIOVERSION;  Surgeon: Wellington Hampshire, MD;  Location: [facility];  Service: Cardiovascular;  Laterality: N/A;  . CARDIOVERSION N/A 07/22/2017   Procedure: Cardioversion;  Surgeon: Minna Merritts, MD;  Location: [facility];  Service: Cardiovascular;  Laterality: N/A;  . CATARACT EXTRACTION W/PHACO Right 09/21/2016   Procedure: CATARACT EXTRACTION PHACO AND INTRAOCULAR LENS PLACEMENT ([place]);  Surgeon: Birder Robson, MD;  Location: [facility];  Service: Ophthalmology;  Laterality: Right;  [place] 44.1 AP% 16.5 CDE 7.30 Fluid Pack Lot #[id number]  . CATARACT EXTRACTION W/PHACO Left 10/19/2016   Procedure: CATARACT EXTRACTION PHACO AND INTRAOCULAR LENS PLACEMENT (IOC);  Surgeon: Birder Robson, MD;  Location: [facility];  Service: Ophthalmology;  Laterality: Left;  [place] 53.7 AP% 19.5 CDE 10.45 Fluid pack lot # [id number]  . CHOLECYSTECTOMY    . COMBINED AUGMENTATION MAMMAPLASTY AND ABDOMINOPLASTY    . JOINT REPLACEMENT Left 06/04/2013   left knee  . KNEE ARTHROSCOPY Right 08/16/2016   Procedure: ARTHROSCOPY KNEE, tear posterior horn medial meniscus, tear anterior and posterior horns of  lateral meniscus, chondromalacia of lateral compartment grade 3 patella and grade 4 medial;  Surgeon: Dereck Leep, MD;  Location: [facility];  Service: Orthopedics;  Laterality: Right;  . MASTECTOMY  1986   nipple sparing mastectomy/Bilateral with silicone  breast implants, s/p saline replacements  . Multiple orthopedic procedures    . NOSE SURGERY    . TEE WITHOUT CARDIOVERSION N/A 09/26/2017   Procedure: TRANSESOPHAGEAL ECHOCARDIOGRAM (TEE);  Surgeon: Fay Records, MD;  Location: [place];  Service: Cardiovascular;  Laterality: N/A;  . TOTAL KNEE ARTHROPLASTY Left      Current Outpatient Medications  Medication Sig Dispense Refill  . acetaminophen (TYLENOL) 500 MG tablet Take 1,000 mg by mouth every 6 (six) hours as needed (pain).     [name] aluminum-magnesium hydroxide 200-200 MG/5ML suspension Take 20 mLs by mouth every 6 (six) hours as needed for indigestion.     [name] amLODipine (NORVASC) 2.5 MG tablet Take 2.5 mg by mouth daily as needed.    [name] amLODipine (NORVASC) 2.5 MG tablet TAKE ONE TABLET EVERY MORNING AND TAKE TWO TABLETS EVERY EVENING 270 tablet 1  . cholecalciferol (VITAMIN D) 1000 units tablet Take 1,000 Units by mouth daily.    . Coenzyme Q10 (COQ10 PO) Take by mouth daily.    [name] ELIQUIS 5 MG TABS tablet TAKE 1 TABLET BY MOUTH TWICE DAILY 60 tablet 3  . metoprolol tartrate (LOPRESSOR) 50 MG tablet Take 2 tablets (100 mg total) by mouth 2 (two) times daily. (Patient taking differently: Take 50 mg by mouth 3 (three) times daily. ) 270 tablet 3  . OVER THE COUNTER MEDICATION Saline nasal spray    . pantoprazole (PROTONIX) 40 MG tablet TAKE 1 TABLET BY MOUTH EVERY DAY 30 tablet 11  . pravastatin (PRAVACHOL) 20 MG tablet TAKE ONE TABLET BY MOUTH EVERY DAY 90 tablet 1  . Probiotic Product (PROBIOTIC ADVANCED PO) Take 1 tablet by mouth daily.     No current facility-administered medications for this visit.     Allergies:   Iodine; Biaxin [clarithromycin]; Codeine; Digoxin and  related; Enalapril; Fluocinonide; Iodinated diagnostic agents; Oxycodone; Promethazine; Tizanidine hcl; Warfarin and related; and Xarelto [rivaroxaban]    Social History:  The patient  reports that she has never smoked. She has never used smokeless tobacco. She reports that she does not drink alcohol or use drugs.   Family History:  The patient's family history includes Breast cancer in her maternal grandmother, sister, sister, and sister; Cancer in her father, maternal grandmother, sister, sister, and sister; Diabetes in her brother; Heart disease in her mother and son.    ROS:  Please see the history of present illness.   Otherwise, review of systems are positive for none.   All other systems are reviewed and negative.    PHYSICAL EXAM: VS:  BP 122/70 (BP Location: Left Arm, Patient Position: Sitting, Cuff  Size: Normal)   Pulse 81   Ht 5' 4.5" (1.638 m)   Wt 165 lb 8 oz (75.1 kg)   BMI 27.97 kg/m  , BMI Body mass index is 27.97 kg/m. GEN: Well nourished, well developed, in no acute distress  HEENT: normal  Neck: no JVD, carotid bruits, or masses Cardiac: Irregularly irregular; no murmurs, rubs, or gallops,no edema  Respiratory:  clear to auscultation bilaterally, normal work of breathing GI: soft, nontender, nondistended, + BS MS: no deformity or atrophy  Skin: warm and dry, no rash Neuro:  Strength and sensation are intact Psych: euthymic mood, full affect   EKG:  EKG is ordered today. The ekg ordered today demonstrates atrial fibrillation with right bundle branch block.  Inferolateral T wave changes suggestive of ischemia.  Recent Labs: 03/03/2018: Hemoglobin 13.2; Platelets 261 05/26/2018: BUN 18; Creatinine, Ser 0.90; Potassium 5.0; Sodium 139    Lipid Panel    Component Value Date/Time   CHOL 147 07/22/2017 1332   TRIG 51 07/22/2017 1332   HDL 63 07/22/2017 1332   CHOLHDL 2.3 07/22/2017 1332   VLDL 10 07/22/2017 1332   LDLCALC 74 07/22/2017 1332      Wt Readings  from Last 3 Encounters:  11/14/18 165 lb 8 oz (75.1 kg)  11/01/18 165 lb 12.8 oz (75.2 kg)  08/17/18 164 lb 8 oz (74.6 kg)       ASSESSMENT AND PLAN:  1.  Permanent atrial fibrillation: Ventricular rate is reasonably controlled but tends to go higher at night.  She did not tolerate higher doses of metoprolol 100 mg twice daily.  Thus, I want her to continue with 50 mg 3 times daily.  She did not tolerate digoxin due to multiple side effects.  I am going to try a small dose diltiazem extended release 120 mg daily to be used daily at bedtime.  If blood pressure drops too much on this, the next step is to switch her to short acting diltiazem to be used as needed.  2. Essential hypertension: Blood pressure is well controlled.  I discontinued amlodipine in order to start diltiazem.  3. History of tachycardia-induced cardiomyopathy. Most recent ejection fraction was 55%.  4. Left carotid calcifications: Carotid Doppler in August of 2018 showed mild nonobstructive disease.  Disposition:   FU with me in 3 months  Signed,  [name], MD  11/14/2018 3:11 PM    [place]

## 2018-11-14 NOTE — Patient Instructions (Signed)
Medication Instructions:  Your physician has recommended you make the following change in your medication:  1- Stop Amlodipine today 2- Start Diltiazem 120 mg ( 1 tablet) daily in the evening.    If you need a refill on your cardiac medications before your next appointment, please call your pharmacy.   Lab work: None ordered   If you have labs (blood work) drawn today and your tests are completely normal, you will receive your results only by: [name] MyChart Message (if you have MyChart) OR . A paper copy in the mail If you have any lab test that is abnormal or we need to change your treatment, we will call you to review the results.  Testing/Procedures: None ordered   Follow-Up: At [facility], you and your health needs are our priority.  As part of our continuing mission to provide you with exceptional heart care, we have created designated Provider Care Teams.  These Care Teams include your primary Cardiologist (physician) and Advanced Practice Providers (APPs -  Physician Assistants and Nurse Practitioners) who all work together to provide you with the care you need, when you need it. You will need a follow up appointment in 3 months.  Please call our office 2 months in advance to schedule this appointment.  You may see [name], MD or one of the following Advanced Practice Providers on your designated Care Team:   Murray Hodgkins, NP Christell Faith, PA-C . Marrianne Mood, PA-C

## 2018-11-20 DIAGNOSIS — N183 Chronic kidney disease, stage 3 (moderate): Secondary | ICD-10-CM | POA: Diagnosis not present

## 2018-11-20 DIAGNOSIS — I129 Hypertensive chronic kidney disease with stage 1 through stage 4 chronic kidney disease, or unspecified chronic kidney disease: Secondary | ICD-10-CM | POA: Diagnosis not present

## 2018-12-14 ENCOUNTER — Other Ambulatory Visit: Payer: Self-pay | Admitting: *Deleted

## 2018-12-14 ENCOUNTER — Telehealth: Payer: Self-pay | Admitting: *Deleted

## 2018-12-14 MED ORDER — DILTIAZEM HCL ER COATED BEADS 180 MG PO CP24: 180.0000 mg | ORAL_CAPSULE | Freq: Every evening | ORAL | 3 refills | Status: DC

## 2018-12-14 NOTE — Telephone Encounter (Signed)
Per Dr. Fletcher Anon, Diltiazem has been increased to 180 mg daily. The patient has verbalized her understanding and will call with any concerns. The new prescription has been sent in for her.

## 2019-01-13 ENCOUNTER — Other Ambulatory Visit: Payer: Self-pay | Admitting: Cardiovascular Disease

## 2019-01-15 NOTE — Telephone Encounter (Signed)
Please review for refill.  

## 2019-02-07 NOTE — Progress Notes (Signed)
Cardiology Office Note   Date:  02/08/2019   ID:  [name] [name], DOB [birth year], MRN [id number]  PCP:  [name], MD  Cardiologist:   [name], MD   Chief Complaint  Patient presents with  . other    3 month f/u c/o swelling left side face, blurred eye vision and headache. Meds reviewed verbally with pt.      History of Present Illness: [name] [name] is a 83 y.o. female who presents for a followup visit regarding permanent atrial fibrillation.   She has known history of  atrial fibrillation/flutter status post multiple cardioversions in the past (total of 7). 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 She had cardiac catheterization twice in the past without significant coronary artery disease. Most recent cardioversion was in Feb of 2018. Echocardiogram in March, 2019 showed an EF of 55 to 60% with mildly dilated left atrium and minimal pulmonary hypertension. She is being treated with rate control at the present time.  During last visit, I switched amlodipine to diltiazem for better rate control and continued metoprolol 50 mg 3 times daily.  She is doing well, overall. Accompanied by her husband. She feels as if her heart rate has been more stable. She endorses a headache to the left side of face which started this past Sunday, 02/04/2019. Associated symptoms include swelling, numbness, left eye vision changes and very faint weakness in her left arm verses her right arm. She is trying to stay healthy because her husband will be undergoing back surgery in the near future. The patient denies SOB, chest pain, lower extremity edema or any other related symptoms or complaints at this time.    Past Medical History:  Diagnosis Date  . Arthritis   . Atrial flutter ([place]) 02/2011   s/p cardioversion   . Chest pain    a. H/o cardiac cath x 2, last 2012 -->nl cors;  b. 12/2016 MV: EF 61%, small region of mild perfusion defect in the apical  anteroseptal region c/w breast attenuation, no ischemia-->Low risk.  . CKD (chronic kidney disease), stage III ([place])   . Cystocele   . GERD (gastroesophageal reflux disease)   . Headache(784.0)    chronic  . Hyperlipidemia   . Hypertension   . Knee fracture   . Mobitz type 2 second degree atrioventricular block    a. felt to be 2/2 amiodarone, resolved with decreased amiodarone dose.  Amio since d/c'd.  . Persistent atrial fibrillation    a. status post multiple DCCVs; b. 2018 - eval for PVI but opted for rate control.  [name] PONV (postoperative nausea and vomiting)    oxycodone and codiene cause N/V   . Pre-syncope    a. In setting of dehydration and AKI in the past.  . Tachycardia induced cardiomyopathy ([place])    a. Resolved;  b. 08/2017 Echo: EF 50-55%, no rwma, mild MR, mildly to mod dil LA/RA.  [name] Venous insufficiency   . Vertigo     Past Surgical History:  Procedure Laterality Date  . ABDOMINAL HYSTERECTOMY  1990  . APPENDECTOMY    . AUGMENTATION MAMMAPLASTY Bilateral 1986   implants  . AUGMENTATION MAMMAPLASTY  1990  . AUGMENTATION MAMMAPLASTY  2011  . CARDIAC CATHETERIZATION    . CARDIOVERSION     x 3  . CARDIOVERSION    . CARDIOVERSION N/A 02/07/2017   Procedure: CARDIOVERSION;  Surgeon: Wellington Hampshire, MD;  Location: [facility];  Service: Cardiovascular;  Laterality: N/A;  . CARDIOVERSION N/A 07/22/2017   Procedure: Cardioversion;  Surgeon: Minna Merritts, MD;  Location: [facility];  Service: Cardiovascular;  Laterality: N/A;  . CATARACT EXTRACTION W/PHACO Right 09/21/2016   Procedure: CATARACT EXTRACTION PHACO AND INTRAOCULAR LENS PLACEMENT ([place]);  Surgeon: Birder Robson, MD;  Location: [facility];  Service: Ophthalmology;  Laterality: Right;  [place] 44.1 AP% 16.5 CDE 7.30 Fluid Pack Lot #[id number]  . CATARACT EXTRACTION W/PHACO Left 10/19/2016   Procedure: CATARACT EXTRACTION PHACO AND INTRAOCULAR LENS PLACEMENT (IOC);  Surgeon: Birder Robson, MD;  Location: [facility];   Service: Ophthalmology;  Laterality: Left;  [place] 53.7 AP% 19.5 CDE 10.45 Fluid pack lot # [id number]  . CHOLECYSTECTOMY    . COMBINED AUGMENTATION MAMMAPLASTY AND ABDOMINOPLASTY    . JOINT REPLACEMENT Left 06/04/2013   left knee  . KNEE ARTHROSCOPY Right 08/16/2016   Procedure: ARTHROSCOPY KNEE, tear posterior horn medial meniscus, tear anterior and posterior horns of lateral meniscus, chondromalacia of lateral compartment grade 3 patella and grade 4 medial;  Surgeon: Dereck Leep, MD;  Location: [facility];  Service: Orthopedics;  Laterality: Right;  . MASTECTOMY  1986   nipple sparing mastectomy/Bilateral with silicone  breast implants, s/p saline replacements  . Multiple orthopedic procedures    . NOSE SURGERY    . TEE WITHOUT CARDIOVERSION N/A 09/26/2017   Procedure: TRANSESOPHAGEAL ECHOCARDIOGRAM (TEE);  Surgeon: Fay Records, MD;  Location: [place];  Service: Cardiovascular;  Laterality: N/A;  . TOTAL KNEE ARTHROPLASTY Left      Current Outpatient Medications  Medication Sig Dispense Refill  . acetaminophen (TYLENOL) 500 MG tablet Take 1,000 mg by mouth every 6 (six) hours as needed (pain).     [name] aluminum-magnesium hydroxide 200-200 MG/5ML suspension Take 20 mLs by mouth every 6 (six) hours as needed for indigestion.     . cholecalciferol (VITAMIN D) 1000 units tablet Take 1,000 Units by mouth daily.    . Coenzyme Q10 (COQ10 PO) Take by mouth daily.    [name] diltiazem (CARDIZEM CD) 180 MG 24 hr capsule Take 1 capsule (180 mg total) by mouth every evening. 30 capsule 3  . ELIQUIS 5 MG TABS tablet TAKE ONE TABLET TWICE DAILY 60 tablet 3  . metoprolol tartrate (LOPRESSOR) 50 MG tablet Take 2 tablets (100 mg total) by mouth 2 (two) times daily. (Patient taking differently: Take 50 mg by mouth 3 (three) times daily. ) 270 tablet 3  . OVER THE COUNTER MEDICATION Saline nasal spray    . pantoprazole (PROTONIX) 40 MG tablet TAKE 1 TABLET BY MOUTH EVERY DAY 30 tablet 11  . pravastatin  (PRAVACHOL) 20 MG tablet TAKE ONE TABLET BY MOUTH EVERY DAY 90 tablet 1  . Probiotic Product (PROBIOTIC ADVANCED PO) Take 1 tablet by mouth daily.     No current facility-administered medications for this visit.     Allergies:   Iodine; Amiodarone; Biaxin [clarithromycin]; Codeine; Digoxin and related; Enalapril; Fluocinonide; Iodinated diagnostic agents; Oxycodone; Promethazine; Tizanidine hcl; Warfarin and related; and Xarelto [rivaroxaban]    Social History:  The patient  reports that she has never smoked. She has never used smokeless tobacco. She reports that she does not drink alcohol or use drugs.   Family History:  The patient's family history includes Breast cancer in her maternal grandmother, sister, sister, and sister; Cancer in her father, maternal grandmother, sister, sister, and sister; Diabetes in her brother; Heart disease in her mother and son.    ROS:  Please see the history  of present illness.   Otherwise, review of systems are positive for none.   All other systems are reviewed and negative.    PHYSICAL EXAM: VS:  BP 128/80 (BP Location: Left Arm, Patient Position: Sitting, Cuff Size: Normal)   Pulse 94   Ht 5' 4.5" (1.638 m)   Wt 165 lb 4 oz (75 kg)   BMI 27.93 kg/m  , BMI Body mass index is 27.93 kg/m. GEN: Well nourished, well developed, in no acute distress  HEENT: Left temporal lobe tenderness Neck: no JVD, carotid bruits, or masses Cardiac: Irregularly irregular; no murmurs, rubs, or gallops,no edema  Respiratory:  clear to auscultation bilaterally, normal work of breathing GI: soft, nontender, nondistended, + BS MS: no deformity or atrophy  Skin: warm and dry, no rash Neuro:  Strength and sensation are intact Psych: euthymic mood, full affect   EKG:  EKG is ordered today. The ekg ordered today demonstrates atrial fibrillation with right bundle branch block.  Inferolateral T wave changes suggestive of ischemia.   Recent Labs: 03/03/2018: Hemoglobin  13.2; Platelets 261 05/26/2018: BUN 18; Creatinine, Ser 0.90; Potassium 5.0; Sodium 139    Lipid Panel    Component Value Date/Time   CHOL 147 07/22/2017 1332   TRIG 51 07/22/2017 1332   HDL 63 07/22/2017 1332   CHOLHDL 2.3 07/22/2017 1332   VLDL 10 07/22/2017 1332   LDLCALC 74 07/22/2017 1332      Wt Readings from Last 3 Encounters:  02/08/19 165 lb 4 oz (75 kg)  11/14/18 165 lb 8 oz (75.1 kg)  11/01/18 165 lb 12.8 oz (75.2 kg)       ASSESSMENT AND PLAN:  1.  Permanent atrial fibrillation: Ventricular rate seems to be reasonably controlled now with diltiazem and metoprolol.  She is tolerating anticoagulation with Eliquis.  I requested routine labs including CBC and basic metabolic profile.  2. Essential hypertension: Blood pressure is well controlled on metoprolol and diltiazem.  3. History of tachycardia-induced cardiomyopathy. Most recent ejection fraction was 55%.  4. Left carotid calcifications: Carotid Doppler in August of 2018 showed mild nonobstructive disease.  5.  Left-sided headache and blurred vision: There is some tenderness in the temporal  area.  This is concerning for temporal arteritis.  Check sedimentation rate and C-reactive protein and if elevated the patient will need to be treated with steroids with possible need for temporal artery biopsy. I discussed the case with the patient's primary care physician, Dr. Derrel Nip.   Disposition:   FU with me in 4 months   I, Margit Banda am acting as a Education administrator for [name], M.D.  I have reviewed the above documentation for accuracy and completeness, and I agree with the above.   Signed, [name], MD 02/08/19 [place], [place]

## 2019-02-08 ENCOUNTER — Encounter: Payer: Self-pay | Admitting: Cardiovascular Disease

## 2019-02-08 ENCOUNTER — Ambulatory Visit: Payer: Medicare Other | Admitting: Cardiovascular Disease

## 2019-02-08 VITALS — BP 128/80 | HR 94 | Ht 64.5 in | Wt 165.2 lb

## 2019-02-08 DIAGNOSIS — I4821 Permanent atrial fibrillation: Secondary | ICD-10-CM

## 2019-02-08 DIAGNOSIS — I10 Essential (primary) hypertension: Secondary | ICD-10-CM

## 2019-02-08 DIAGNOSIS — R51 Headache: Secondary | ICD-10-CM

## 2019-02-08 DIAGNOSIS — I739 Peripheral vascular disease, unspecified: Secondary | ICD-10-CM

## 2019-02-08 DIAGNOSIS — R519 Headache, unspecified: Secondary | ICD-10-CM

## 2019-02-08 DIAGNOSIS — R000 Tachycardia, unspecified: Secondary | ICD-10-CM | POA: Diagnosis not present

## 2019-02-08 DIAGNOSIS — I779 Disorder of arteries and arterioles, unspecified: Secondary | ICD-10-CM

## 2019-02-08 DIAGNOSIS — I43 Cardiomyopathy in diseases classified elsewhere: Secondary | ICD-10-CM | POA: Diagnosis not present

## 2019-02-08 LAB — C-REACTIVE PROTEIN: CRP: 1 mg/L (ref 0–10)

## 2019-02-08 LAB — BASIC METABOLIC PANEL
BUN/Creatinine Ratio: 19 (ref 12–28)
BUN: 16 mg/dL (ref 8–27)
CO2: 20 mmol/L (ref 20–29)
Calcium: 9.2 mg/dL (ref 8.7–10.3)
Chloride: 102 mmol/L (ref 96–106)
Creatinine, Ser: 0.86 mg/dL (ref 0.57–1.00)
GFR calc Af Amer: 73 mL/min/{1.73_m2} (ref >59)
GFR calc non Af Amer: 64 mL/min/{1.73_m2} (ref >59)
Glucose: 132 mg/dL — ABNORMAL HIGH (ref 65–99)
Potassium: 4.8 mmol/L (ref 3.5–5.2)
Sodium: 137 mmol/L (ref 134–144)

## 2019-02-08 LAB — CBC
Hematocrit: 42.4 % (ref 34.0–46.6)
Hemoglobin: 14.1 g/dL (ref 11.1–15.9)
MCH: 31.6 pg (ref 26.6–33.0)
MCHC: 33.3 g/dL (ref 31.5–35.7)
MCV: 95 fL (ref 79–97)
Platelets: 266 10*3/uL (ref 150–450)
RBC: 4.46 x10E6/uL (ref 3.77–5.28)
RDW: 12.2 % (ref 11.7–15.4)
WBC: 6 10*3/uL (ref 3.4–10.8)

## 2019-02-08 LAB — SEDIMENTATION RATE: Sed Rate: 45 mm/hr — ABNORMAL HIGH (ref 0–40)

## 2019-02-08 NOTE — Patient Instructions (Signed)
Medication Instructions:  No changes If you need a refill on your cardiac medications before your next appointment, please call your pharmacy.   Lab work: Your provider would like for you to have the following labs today: Sedimentation rate, CBC, and BMET  If you have labs (blood work) drawn today and your tests are completely normal, you will receive your results only by: [name] MyChart Message (if you have MyChart) OR . A paper copy in the mail If you have any lab test that is abnormal or we need to change your treatment, we will call you to review the results.  Testing/Procedures: None ordered  Follow-Up: At [facility], you and your health needs are our priority.  As part of our continuing mission to provide you with exceptional heart care, we have created designated Provider Care Teams.  These Care Teams include your primary Cardiologist (physician) and Advanced Practice Providers (APPs -  Physician Assistants and Nurse Practitioners) who all work together to provide you with the care you need, when you need it. You will need a follow up appointment in 4 months.  You may see [name], MD or one of the following Advanced Practice Providers on your designated Care Team:   Murray Hodgkins, NP Christell Faith, PA-C

## 2019-02-09 ENCOUNTER — Other Ambulatory Visit: Payer: Self-pay | Admitting: Internal Medicine

## 2019-02-09 ENCOUNTER — Telehealth: Payer: Self-pay | Admitting: Cardiovascular Disease

## 2019-02-09 ENCOUNTER — Other Ambulatory Visit: Payer: Self-pay | Admitting: Cardiovascular Disease

## 2019-02-09 NOTE — Telephone Encounter (Signed)
Labs from 2/20 have resulted to Dr.Arida's inbasket. Awaiting MD reivew.

## 2019-02-09 NOTE — Telephone Encounter (Signed)
Patient made aware of results and verbalized understanding.  Inform patient that labs were normal. Inflammation test came back fine and thus I do not think she needs steroids. She should continue to monitor her symptoms and if there is no improvement, she should check with Dr. Lupita Dawn office early next week.

## 2019-02-09 NOTE — Telephone Encounter (Signed)
Patient came by to check on status of results Please call ASAP after reviewed by doctor  Patient wishes to also review results with PCP before weekend

## 2019-02-09 NOTE — Telephone Encounter (Signed)
Patient calling to discuss recent blood  testing results   Please call   

## 2019-02-12 ENCOUNTER — Telehealth: Payer: Self-pay

## 2019-02-12 ENCOUNTER — Emergency Department: Payer: Medicare Other

## 2019-02-12 ENCOUNTER — Encounter: Payer: Self-pay | Admitting: Emergency Medicine

## 2019-02-12 ENCOUNTER — Emergency Department
Admission: EM | Admit: 2019-02-12 | Discharge: 2019-02-12 | Disposition: A | Discharge status: Home / Self Care | Payer: Medicare Other | Attending: Emergency Medicine | Admitting: Emergency Medicine

## 2019-02-12 ENCOUNTER — Ambulatory Visit: Payer: Self-pay | Admitting: *Deleted

## 2019-02-12 ENCOUNTER — Other Ambulatory Visit: Payer: Self-pay

## 2019-02-12 DIAGNOSIS — N183 Chronic kidney disease, stage 3 (moderate): Secondary | ICD-10-CM | POA: Diagnosis not present

## 2019-02-12 DIAGNOSIS — R202 Paresthesia of skin: Secondary | ICD-10-CM | POA: Insufficient documentation

## 2019-02-12 DIAGNOSIS — I129 Hypertensive chronic kidney disease with stage 1 through stage 4 chronic kidney disease, or unspecified chronic kidney disease: Secondary | ICD-10-CM | POA: Insufficient documentation

## 2019-02-12 DIAGNOSIS — I4891 Unspecified atrial fibrillation: Secondary | ICD-10-CM | POA: Insufficient documentation

## 2019-02-12 DIAGNOSIS — Z96652 Presence of left artificial knee joint: Secondary | ICD-10-CM | POA: Diagnosis not present

## 2019-02-12 DIAGNOSIS — R200 Anesthesia of skin: Secondary | ICD-10-CM | POA: Diagnosis not present

## 2019-02-12 LAB — COMPREHENSIVE METABOLIC PANEL
ALT: 32 U/L (ref 0–44)
AST: 18 U/L (ref 15–41)
Albumin: 4.3 g/dL (ref 3.5–5.0)
Alkaline Phosphatase: 80 U/L (ref 38–126)
Anion gap: 5 (ref 5–15)
BUN: 17 mg/dL (ref 8–23)
CO2: 24 mmol/L (ref 22–32)
Calcium: 9.2 mg/dL (ref 8.9–10.3)
Chloride: 105 mmol/L (ref 98–111)
Creatinine, Ser: 0.75 mg/dL (ref 0.44–1.00)
GFR calc Af Amer: >60 mL/min (ref >60)
GFR calc non Af Amer: >60 mL/min (ref >60)
Glucose, Bld: 147 mg/dL — ABNORMAL HIGH (ref 70–99)
Potassium: 4.6 mmol/L (ref 3.5–5.1)
Sodium: 134 mmol/L — ABNORMAL LOW (ref 135–145)
Total Bilirubin: 1 mg/dL (ref 0.3–1.2)
Total Protein: 7.5 g/dL (ref 6.5–8.1)

## 2019-02-12 LAB — CBC
HCT: 44.8 % (ref 36.0–46.0)
Hemoglobin: 14.3 g/dL (ref 12.0–15.0)
MCH: 31 pg (ref 26.0–34.0)
MCHC: 31.9 g/dL (ref 30.0–36.0)
MCV: 97 fL (ref 80.0–100.0)
Platelets: 263 10*3/uL (ref 150–400)
RBC: 4.62 MIL/uL (ref 3.87–5.11)
RDW: 13.2 % (ref 11.5–15.5)
WBC: 6.1 10*3/uL (ref 4.0–10.5)
nRBC: 0 % (ref 0.0–0.2)

## 2019-02-12 LAB — DIFFERENTIAL
Abs Immature Granulocytes: 0.01 10*3/uL (ref 0.00–0.07)
Basophils Absolute: 0 10*3/uL (ref 0.0–0.1)
Basophils Relative: 1 %
Eosinophils Absolute: 0 10*3/uL (ref 0.0–0.5)
Eosinophils Relative: 1 %
Immature Granulocytes: 0 %
Lymphocytes Relative: 14 %
Lymphs Abs: 0.8 10*3/uL (ref 0.7–4.0)
Monocytes Absolute: 0.3 10*3/uL (ref 0.1–1.0)
Monocytes Relative: 5 %
Neutro Abs: 4.9 10*3/uL (ref 1.7–7.7)
Neutrophils Relative %: 79 %

## 2019-02-12 LAB — PROTIME-INR
INR: 1.3
Prothrombin Time: 15.6 seconds — ABNORMAL HIGH (ref 11.4–15.2)

## 2019-02-12 LAB — APTT: aPTT: 52 seconds — ABNORMAL HIGH (ref 24–36)

## 2019-02-12 MED ORDER — LORAZEPAM 0.5 MG PO TABS: 0.5000 mg | ORAL_TABLET | ORAL | 0 refills | Status: DC | PRN

## 2019-02-12 MED ORDER — TRAZODONE HCL 50 MG PO TABS: 50.0000 mg | ORAL_TABLET | Freq: Every day | ORAL | 0 refills | Status: DC

## 2019-02-12 NOTE — ED Provider Notes (Signed)
[facility] Emergency Department Provider Note       Time seen: ----------------------------------------- 12:06 PM on 02/12/2019 -----------------------------------------   I have reviewed the triage vital signs and the nursing notes.  HISTORY   Chief Complaint Numbness    HPI [name] [name] is a 83 y.o. female with a history of arthritis, atrial flutter, chest pain, CKD, hyperlipidemia, hypertension who presents to the ED for numbness.  Patient states she had a headache 8 days ago which got better.  She also states her lip started feeling numb on Saturday and she had some soreness to the left side of her face.  She had another headache yesterday.  She woke up this morning and her lips were numb this morning she has had left eye blurry vision as well.  Past Medical History:  Diagnosis Date  . Arthritis   . Atrial flutter ([place]) 02/2011   s/p cardioversion   . Chest pain    a. H/o cardiac cath x 2, last 2012 -->nl cors;  b. 12/2016 MV: EF 61%, small region of mild perfusion defect in the apical anteroseptal region c/w breast attenuation, no ischemia-->Low risk.  . CKD (chronic kidney disease), stage III ([place])   . Cystocele   . GERD (gastroesophageal reflux disease)   . Headache(784.0)    chronic  . Hyperlipidemia   . Hypertension   . Knee fracture   . Mobitz type 2 second degree atrioventricular block    a. felt to be 2/2 amiodarone, resolved with decreased amiodarone dose.  Amio since d/c'd.  . Persistent atrial fibrillation    a. status post multiple DCCVs; b. 2018 - eval for PVI but opted for rate control.  [name] PONV (postoperative nausea and vomiting)    oxycodone and codiene cause N/V   . Pre-syncope    a. In setting of dehydration and AKI in the past.  . Tachycardia induced cardiomyopathy ([place])    a. Resolved;  b. 08/2017 Echo: EF 50-55%, no rwma, mild MR, mildly to mod dil LA/RA.  [name] Venous insufficiency   . Vertigo     Patient Active Problem  List   Diagnosis Date Noted  . Pain of right scapula 08/01/2017  . Chest pain 07/22/2017  . Sinusitis 06/14/2017  . Influenza 01/10/2017  . Rash and nonspecific skin eruption 11/25/2016  . Concussion with no loss of consciousness 09/01/2016  . Chronic venous insufficiency 09/01/2016  . Preoperative clearance 06/29/2016  . Other fatigue 06/29/2016  . Visit for preventive health examination 12/15/2015  . GERD (gastroesophageal reflux disease) 11/20/2015  . Cystocele 11/18/2015  . Vaginal atrophy 11/18/2015  . Chronic suprapubic pain 10/14/2015  . Inguinal hernia 10/13/2015  . Impaired fasting glucose 04/24/2015  . Pulmonary hypertension, moderate to severe ([place]) 04/24/2015  . Arthritis of knee, degenerative 02/21/2015  . Vitamin D deficiency 12/27/2014  . S/P TAH-BSO 12/13/2014  . S/P bilateral mastectomy 12/13/2014  . Long term current use of anticoagulant therapy 09/25/2014  . HH (hiatus hernia) 04/20/2014  . Tachycardia induced cardiomyopathy ([place]) 07/20/2013  . Unspecified vitamin D deficiency 04/24/2013  . Family history of breast cancer in female 04/23/2013  . Medicare annual wellness visit, subsequent 04/23/2013  . Obesity 06/30/2012  . History of [place] spotted fever 06/22/2012  . Persistent atrial fibrillation 05/09/2012  . Anxiety and depression 05/09/2012  . TACHYCARDIA 02/01/2011  . Hyperlipidemia 07/08/2010  . Essential hypertension 03/05/2009    Past Surgical History:  Procedure Laterality Date  . ABDOMINAL HYSTERECTOMY  1990  .  APPENDECTOMY    . AUGMENTATION MAMMAPLASTY Bilateral 1986   implants  . AUGMENTATION MAMMAPLASTY  1990  . AUGMENTATION MAMMAPLASTY  2011  . CARDIAC CATHETERIZATION    . CARDIOVERSION     x 3  . CARDIOVERSION    . CARDIOVERSION N/A 02/07/2017   Procedure: CARDIOVERSION;  Surgeon: Wellington Hampshire, MD;  Location: [facility] ORS;  Service: Cardiovascular;  Laterality: N/A;  . CARDIOVERSION N/A 07/22/2017   Procedure: Cardioversion;   Surgeon: Minna Merritts, MD;  Location: [facility] ORS;  Service: Cardiovascular;  Laterality: N/A;  . CATARACT EXTRACTION W/PHACO Right 09/21/2016   Procedure: CATARACT EXTRACTION PHACO AND INTRAOCULAR LENS PLACEMENT ([place]);  Surgeon: Birder Robson, MD;  Location: [facility] ORS;  Service: Ophthalmology;  Laterality: Right;  [place] 44.1 AP% 16.5 CDE 7.30 Fluid Pack Lot #[id number]  . CATARACT EXTRACTION W/PHACO Left 10/19/2016   Procedure: CATARACT EXTRACTION PHACO AND INTRAOCULAR LENS PLACEMENT (IOC);  Surgeon: Birder Robson, MD;  Location: [facility] ORS;  Service: Ophthalmology;  Laterality: Left;  [place] 53.7 AP% 19.5 CDE 10.45 Fluid pack lot # [id number]  . CHOLECYSTECTOMY    . COMBINED AUGMENTATION MAMMAPLASTY AND ABDOMINOPLASTY    . JOINT REPLACEMENT Left 06/04/2013   left knee  . KNEE ARTHROSCOPY Right 08/16/2016   Procedure: ARTHROSCOPY KNEE, tear posterior horn medial meniscus, tear anterior and posterior horns of lateral meniscus, chondromalacia of lateral compartment grade 3 patella and grade 4 medial;  Surgeon: Dereck Leep, MD;  Location: [facility] ORS;  Service: Orthopedics;  Laterality: Right;  . MASTECTOMY  1986   nipple sparing mastectomy/Bilateral with silicone  breast implants, s/p saline replacements  . Multiple orthopedic procedures    . NOSE SURGERY    . TEE WITHOUT CARDIOVERSION N/A 09/26/2017   Procedure: TRANSESOPHAGEAL ECHOCARDIOGRAM (TEE);  Surgeon: Fay Records, MD;  Location: [facility] ENDOSCOPY;  Service: Cardiovascular;  Laterality: N/A;  . TOTAL KNEE ARTHROPLASTY Left     Allergies Iodine; Amiodarone; Biaxin [clarithromycin]; Codeine; Digoxin and related; Enalapril; Fluocinonide; Iodinated diagnostic agents; Oxycodone; Promethazine; Tizanidine hcl; Warfarin and related; and Xarelto [rivaroxaban]  Social History Social History   Tobacco Use  . Smoking status: Never Smoker  . Smokeless tobacco: Never Used  Substance Use Topics  . Alcohol use: No  . Drug use: No   Review of  Systems Constitutional: Negative for fever. Cardiovascular: Negative for chest pain. Respiratory: Negative for shortness of breath. Gastrointestinal: Negative for abdominal pain, vomiting and diarrhea. Musculoskeletal: Negative for back pain. Skin: Negative for rash. Neurological: Positive for numbness, blurry vision  All systems negative/normal/unremarkable except as stated in the HPI  ____________________________________________   PHYSICAL EXAM:  VITAL SIGNS: ED Triage Vitals  Enc Vitals Group     BP 02/12/19 1050 (!) 148/105     Pulse Rate 02/12/19 1050 77     Resp 02/12/19 1050 14     Temp 02/12/19 1050 98.2 F (36.8 C)     Temp Source 02/12/19 1050 Oral     SpO2 02/12/19 1050 96 %     Weight 02/12/19 1051 165 lb 2 oz (74.9 kg)     Height 02/12/19 1051 5\' 4"  (1.626 m)     Head Circumference --      Peak Flow --      Pain Score 02/12/19 1050 5     Pain Loc --      Pain Edu? --      Excl. in [place]? --    Constitutional: Alert and oriented. Well appearing and in no distress. Eyes:  Conjunctivae are normal. Normal extraocular movements. ENT      Head: Normocephalic and atraumatic.      Nose: No congestion/rhinnorhea.      Mouth/Throat: Mucous membranes are moist.      Neck: No stridor. Cardiovascular: Normal rate, regular rhythm. No murmurs, rubs, or gallops. Respiratory: Normal respiratory effort without tachypnea nor retractions. Breath sounds are clear and equal bilaterally. No wheezes/rales/rhonchi. Gastrointestinal: Soft and nontender. Normal bowel sounds Musculoskeletal: Nontender with normal range of motion in extremities. No lower extremity tenderness nor edema. Neurologic:  Normal speech and language. No gross focal neurologic deficits are appreciated.  Skin:  Skin is warm, dry and intact. No rash noted. Psychiatric: Mood and affect are normal. Speech and behavior are normal.  ____________________________________________  ED COURSE:  As part of my medical  decision making, I reviewed the following data within the [place] History obtained from family if available, nursing notes, old chart and ekg, as well as notes from prior ED visits. Patient presented for paresthesias, we will assess with labs and imaging as indicated at this time.   Procedures ____________________________________________   LABS (pertinent positives/negatives)  Labs Reviewed  PROTIME-INR - Abnormal; Notable for the following components:      Result Value   Prothrombin Time 15.6 (*)    All other components within normal limits  APTT - Abnormal; Notable for the following components:   aPTT 52 (*)    All other components within normal limits  COMPREHENSIVE METABOLIC PANEL - Abnormal; Notable for the following components:   Sodium 134 (*)    Glucose, Bld 147 (*)    All other components within normal limits  CBC  DIFFERENTIAL    RADIOLOGY  Ct head IMPRESSION: 1. No intracranial hemorrhage or CT evidence of large acute infarct. Hyperdensity of vasculature including basilar tip similar to prior exam without evidence of acute hyperdense vessel. 2. Chronic microvascular changes. 3. Global atrophy.  ____________________________________________   DIFFERENTIAL DIAGNOSIS   Anxiety, CVA, TIA, dehydration, electrolyte abnormality  FINAL ASSESSMENT AND PLAN  Paresthesias   Plan: The patient had presented for paresthesias. Patient's labs were reassuring. Patient's imaging was also reassuring.  She does appear to be under significant stress.  She notes that she is not sleeping at night, husband has been dealing with significant medical problems.  She will be discharged with Ativan to try as needed and trazodone at night to help her sleep.   Laurence Aly, MD    Note: This note was generated in part or whole with voice recognition software. Voice recognition is usually quite accurate but there are transcription errors that can and very often  do occur. I apologize for any typographical errors that were not detected and corrected.     Earleen Newport, MD 02/12/19 [phone]

## 2019-02-12 NOTE — Telephone Encounter (Signed)
Called patient to follow-up from note sent from [facility] Triage RN.  Spoke with patient.  Patient said that she was not feeling well.  Patient said that she began having headaches about a week ago and have been taking Tylenol for headaches.  Patient said that on Saturday she was swollen from fluid so much so that she could not fit into her clothes.  Patient said that she was no longer swollen all over her body and could fit into her clothes better today but now the left-side of her face is swollen.  Patient said that the left-side of her jaw is swollen and said that left temple is swollen and swore.  Patient said that the swelling is noticeable by looking at her face.  Patient also said that her upper lip was swollen and beginning to feel numb.  Patient said that she has some swelling in her ankles but denies any swelling in her feet and hands.  Patient says that she only has shortness of breath when she walks up the steps.  Did not hear any sounds of slurred speech or shortness of breath while talking with patient on phone.  Recommended that patient go to the ED for further evaluation.  Asked patient if she has someone to drive her to the hospital.  Patient said that her husband could take her.  Patient agreed to go to ED and said that she would leave for the ED as soon as she put her shirt on.  Patient said that she lives approximately 15 minutes from the hospital.  Will forward note to PCP.

## 2019-02-12 NOTE — Telephone Encounter (Signed)
Copied from [place] [id number]. Topic: Appointment Scheduling - Prior Auth Required for Appointment >> Feb 12, 2019  4:13 PM Vernona Rieger wrote: Patient states she went to the emergency room today & was advised to follow up in one week. Dr Derrel Nip is full, please advise.

## 2019-02-12 NOTE — Telephone Encounter (Signed)
Pt called with having some left facial soreness and swelling . Sore place across her temple and hairline. Tender bump there but has been.  She saw her cardiologist last Thursday and had blood work done. Per pt that was fine. Last Sunday, had a headache and took Extra strength Tylenol and went to sleep and that helped.  This morning another headache. She thinks her clothes are fitting tight that she thinks she has fluid onboard.  She states her lips sometimes feel numb.  Denies facial drooping or slurred speech.  Her b/p this morning was 138/80 HR 90 and last night it was 124/72 and HR was 84 She is requesting an appointment before March. Her husband will be having surgery and she has to be able to take care of him.  Pt stated she would go to the ED if her symptoms become worst. Routing to flow at [facility].  Reason for Disposition . Nursing judgment or information in reference  Answer Assessment - Initial Assessment Questions 1. REASON FOR CALL: "What is your main concern right now?"     To make an appointment 2. ONSET: "When did the symptoms start?"     Last week 3. SEVERITY: "How bad is the symptoms?"     Mild to moderate 4. FEVER: "Do you have a fever?"     no 5. OTHER SYMPTOMS: "Do you have any other new symptoms?"     no 6. INTERVENTIONS AND RESPONSE: "What have you done so far to try to make this better? What medications have you used?"     Take Ex-tylenol for the headache and saw her cardiologis 7. PREGNANCY: "Is there any chance you are pregnant?"     n/a  Protocols used: NO GUIDELINE AVAILABLE-A-AH

## 2019-02-12 NOTE — Telephone Encounter (Signed)
Would it be okay to schedule pt next Friday 02/23/2019 at 11:30 for an ED follow up?

## 2019-02-12 NOTE — ED Notes (Signed)
First Nurse Note: Patient complaining of numbness around her mouth and left side of face since Sat.  Worse this AM.  Recent HX of HA and "swelling all over".  Alert and oriented X4.

## 2019-02-12 NOTE — ED Notes (Signed)
NAD noted at time of D/C. Pt denies questions or concerns. Pt ambulatory to the lobby at this time.  

## 2019-02-12 NOTE — ED Triage Notes (Signed)
Says she had headache 8 days ago--got better.  Says lips started feeling numb on Saturday.  Also says she has had some sore ness in left face.  Had another headache yesterday.  She woke up and lips were numb this am.  Went to bed at 1030p, and was not numb then.  Also says her left eye is blurry since Thursday.

## 2019-02-12 NOTE — ED Notes (Signed)
Pt presents to ED via POV, states several days ago began having migraine HA. Pt c/o L temporal soreness and blurry vision to L eye that started this morning. Pt c/o some swelling to L face, and numbness around L mouth. LKW 2230 2/23. Pt c/o intermittent migraine HA at this time. Pt denies recent sickness, denies medication changes.

## 2019-02-13 ENCOUNTER — Ambulatory Visit: Payer: Medicare Other | Admitting: Family Medicine

## 2019-02-13 NOTE — Telephone Encounter (Signed)
Please use the 11:00 slot on Thursday feb 27 vacated by [employer] .

## 2019-02-13 NOTE — Telephone Encounter (Signed)
Spoke with pt and she has been scheduled for 02/15/2019 at 11am. Pt is aware of appt date and time.

## 2019-02-15 ENCOUNTER — Ambulatory Visit (INDEPENDENT_AMBULATORY_CARE_PROVIDER_SITE_OTHER): Payer: Medicare Other | Admitting: Internal Medicine

## 2019-02-15 ENCOUNTER — Encounter: Payer: Self-pay | Admitting: Internal Medicine

## 2019-02-15 VITALS — BP 118/82 | HR 78 | Temp 97.6°F | Resp 15 | Ht 64.0 in | Wt 169.0 lb

## 2019-02-15 DIAGNOSIS — F419 Anxiety disorder, unspecified: Secondary | ICD-10-CM | POA: Diagnosis not present

## 2019-02-15 DIAGNOSIS — F329 Major depressive disorder, single episode, unspecified: Secondary | ICD-10-CM

## 2019-02-15 DIAGNOSIS — E871 Hypo-osmolality and hyponatremia: Secondary | ICD-10-CM | POA: Diagnosis not present

## 2019-02-15 DIAGNOSIS — F32A Depression, unspecified: Secondary | ICD-10-CM

## 2019-02-15 DIAGNOSIS — G441 Vascular headache, not elsewhere classified: Secondary | ICD-10-CM

## 2019-02-15 DIAGNOSIS — G44001 Cluster headache syndrome, unspecified, intractable: Secondary | ICD-10-CM | POA: Diagnosis not present

## 2019-02-15 LAB — SEDIMENTATION RATE: Sed Rate: 23 mm/hr (ref 0–30)

## 2019-02-15 LAB — BASIC METABOLIC PANEL
BUN: 20 mg/dL (ref 6–23)
CO2: 26 mEq/L (ref 19–32)
Calcium: 9.2 mg/dL (ref 8.4–10.5)
Chloride: 100 mEq/L (ref 96–112)
Creatinine, Ser: 0.91 mg/dL (ref 0.40–1.20)
GFR: 59.32 mL/min — ABNORMAL LOW (ref >60.00)
Glucose, Bld: 76 mg/dL (ref 70–99)
Potassium: 4.7 mEq/L (ref 3.5–5.1)
Sodium: 135 mEq/L (ref 135–145)

## 2019-02-15 MED ORDER — PAROXETINE HCL 10 MG PO TABS: 10.0000 mg | ORAL_TABLET | Freq: Every day | ORAL | 2 refills | Status: DC

## 2019-02-15 NOTE — Patient Instructions (Addendum)
I am starting you on generic Paxil.   Start with 1/2 tablet daily on a full stomach (after dinner)  Try not to use the lorazepam during the day,  Or use 1/2 tablet if you have to.   After 4-5 days,  If no nausea,  Increase to full tablet daily  Ok to continue using lorazepam if needed for sleep   After 2 weeks on full tablet  Start taking melatonin after dinner as well   I want you to start walking every day.  It is good for anxiety
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
## 2019-02-17 DIAGNOSIS — R51 Headache: Secondary | ICD-10-CM

## 2019-02-17 DIAGNOSIS — R519 Headache, unspecified: Secondary | ICD-10-CM | POA: Insufficient documentation

## 2019-02-17 NOTE — Assessment & Plan Note (Signed)
Esr AND crp AE REPEATDLY NORMAL  Ct HEAD NEGATIVE FOR ACUTE CHANGES.[name]  Headache is currently resolved  .
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
## 2019-03-07 ENCOUNTER — Ambulatory Visit: Payer: Medicare Other | Admitting: Family Medicine

## 2019-03-08 ENCOUNTER — Other Ambulatory Visit: Payer: Self-pay

## 2019-03-08 ENCOUNTER — Encounter: Payer: Self-pay | Admitting: Internal Medicine

## 2019-03-08 ENCOUNTER — Ambulatory Visit (INDEPENDENT_AMBULATORY_CARE_PROVIDER_SITE_OTHER): Payer: Medicare Other | Admitting: Internal Medicine

## 2019-03-08 VITALS — BP 130/70 | HR 66 | Temp 98.1°F | Wt 169.0 lb

## 2019-03-08 DIAGNOSIS — S5002XA Contusion of left elbow, initial encounter: Secondary | ICD-10-CM | POA: Diagnosis not present

## 2019-03-08 DIAGNOSIS — M25422 Effusion, left elbow: Secondary | ICD-10-CM

## 2019-03-08 DIAGNOSIS — M25522 Pain in left elbow: Secondary | ICD-10-CM

## 2019-03-08 NOTE — Progress Notes (Signed)
Subjective:  Patient ID: [name] [name], female    DOB: [birth year]  Age: 83 y.o. MRN: [id number]  CC: The primary encounter diagnosis was Elbow effusion, left. A diagnosis of Left elbow pain was also pertinent to this visit.  HPI [name] [name] presents for evaluation of painful left arm following a recent fall. The fall occurred 9 days ago when she tripped over a concrete parking blokes ement chock stop  in a  public  parking lot.  Went to a Chiropractorx rayed it same day and no fracture seent but she has persistent pain at the elbow and distally ,  At the proximal end of the ulna and radius.  She is on chronic antiicoagulation with Eliquis and has developed a bloody effusion at  the elbow  ,  The skin is warm and the entire arm is purple    Outpatient Medications Prior to Visit  Medication Sig Dispense Refill   acetaminophen (TYLENOL) 500 MG tablet Take 1,000 mg by mouth every 6 (six) hours as needed (pain).      aluminum-magnesium hydroxide 200-200 MG/5ML suspension Take 20 mLs by mouth every 6 (six) hours as needed for indigestion.      amLODipine (NORVASC) 2.5 MG tablet TAKE ONE TABLET BY MOUTH EVERY MORNING AND TAKE TWO TABLETS EVERY EVENING 270 tablet 0   cholecalciferol (VITAMIN D) 1000 units tablet Take 1,000 Units by mouth daily.     Coenzyme Q10 (COQ10 PO) Take by mouth daily.     ELIQUIS 5 MG TABS tablet TAKE ONE TABLET TWICE DAILY 60 tablet 3   LORazepam (ATIVAN) 0.5 MG tablet Take 1 tablet (0.5 mg total) by mouth as needed for anxiety. 30 tablet 0   metoprolol tartrate (LOPRESSOR) 50 MG tablet Take 2 tablets (100 mg total) by mouth 2 (two) times daily. (Patient taking differently: Take 50 mg by mouth 3 (three) times daily. ) 270 tablet 3   OVER THE COUNTER MEDICATION Saline nasal spray     pantoprazole (PROTONIX) 40 MG tablet TAKE ONE TABLET EVERY DAY 30 tablet 6   pravastatin (PRAVACHOL) 20 MG tablet TAKE ONE TABLET EVERY DAY 90 tablet 1   Probiotic Product  (PROBIOTIC ADVANCED PO) Take 1 tablet by mouth daily.     traZODone (DESYREL) 50 MG tablet Take 1 tablet (50 mg total) by mouth at bedtime. 30 tablet 0   diltiazem (CARDIZEM CD) 180 MG 24 hr capsule Take 1 capsule (180 mg total) by mouth every evening. 30 capsule 3   PARoxetine (PAXIL) 10 MG tablet Take 1 tablet (10 mg total) by mouth daily. 30 tablet 2   No facility-administered medications prior to visit.     Review of Systems;  Patient denies headache, fevers, malaise, unintentional weight loss, skin rash, eye pain, sinus congestion and sinus pain, sore throat, dysphagia,  hemoptysis , cough, dyspnea, wheezing, chest pain, palpitations, orthopnea, edema, abdominal pain, nausea, melena, diarrhea, constipation, flank pain, dysuria, hematuria, urinary  Frequency, nocturia, numbness, tingling, seizures,  Focal weakness, Loss of consciousness,  Tremor, insomnia, depression, anxiety, and suicidal ideation.      Objective:  BP 130/70    Pulse 66    Temp 98.1 F (36.7 C) (Oral)    Wt 169 lb (76.7 kg)    SpO2 97%    BMI 29.01 kg/m   BP Readings from Last 3 Encounters:  03/08/19 130/70  02/15/19 118/82  02/12/19 (!) 145/94    Wt Readings from Last 3 Encounters:  03/08/19 169 lb (  76.7 kg)  02/15/19 169 lb (76.7 kg)  02/12/19 165 lb 2 oz (74.9 kg)    General appearance: alert, cooperative and appears stated age Ears: normal TM's and external ear canals both ears Throat: lips, mucosa, and tongue normal; teeth and gums normal Neck: no adenopathy, no carotid bruit, supple, symmetrical, trachea midline and thyroid not enlarged, symmetric, no tenderness/mass/nodules Back: symmetric, no curvature. ROM normal. No CVA tenderness. Lungs: clear to auscultation bilaterally Heart: regular rate and rhythm, S1, S2 normal, no murmur, click, rub or gallop Abdomen: soft, non-tender; bowel sounds normal; no masses,  no organomegaly Pulses: 2+ and symmetric Skin: left arm with diffuse purpura/hematoma  from elbow to wrist .  No warmth Lymph nodes: Cervical, supraclavicular, and axillary nodes normal.  Lab Results  Component Value Date   HGBA1C 5.7 04/08/2017   HGBA1C 5.8 06/28/2016   HGBA1C 5.9 10/14/2015    Lab Results  Component Value Date   CREATININE 0.91 02/15/2019   CREATININE 0.75 02/12/2019   CREATININE 0.86 02/08/2019    Lab Results  Component Value Date   WBC 6.1 02/12/2019   HGB 14.3 02/12/2019   HCT 44.8 02/12/2019   PLT 263 02/12/2019   GLUCOSE 76 02/15/2019   CHOL 147 07/22/2017   TRIG 51 07/22/2017   HDL 63 07/22/2017   LDLCALC 74 07/22/2017   ALT 32 02/12/2019   AST 18 02/12/2019   NA 135 02/15/2019   K 4.7 02/15/2019   CL 100 02/15/2019   CREATININE 0.91 02/15/2019   BUN 20 02/15/2019   CO2 26 02/15/2019   TSH 3.500 07/21/2017   INR 1.3 02/12/2019   HGBA1C 5.7 04/08/2017   MICROALBUR 8.3 05/25/2017    Ct Head Wo Contrast  Result Date: 02/12/2019 CLINICAL DATA:  83 year old female with numbness around mouth and left-side of face for the past 2 days. Initial encounter. EXAM: CT HEAD WITHOUT CONTRAST TECHNIQUE: Contiguous axial images were obtained from the base of the skull through the vertex without intravenous contrast. COMPARISON:  10/12/2017 CT. FINDINGS: Brain: No intracranial hemorrhage or CT evidence of large acute infarct. Chronic microvascular changes. Global atrophy. No intracranial mass lesion noted on this unenhanced exam. Vascular: Vascular calcifications. Hyperdensity of vasculature including basilar tip similar to prior exam without evidence of acute hyperdense vessel. Skull: Negative Sinuses/Orbits: No acute orbital abnormality noted. Post lens replacement. Visualized paranasal sinuses clear. Other: Visualized mastoid air cells and middle ear cavities clear. IMPRESSION: 1. No intracranial hemorrhage or CT evidence of large acute infarct. Hyperdensity of vasculature including basilar tip similar to prior exam without evidence of acute  hyperdense vessel. 2. Chronic microvascular changes. 3. Global atrophy. Electronically Signed   By: Genia Del M.D.   On: 02/12/2019 11:25    Assessment & Plan:   Problem List Items Addressed This Visit    Elbow effusion, left - Primary    Traumatic.  She has pai ndstlly at th ulc=nar  ,  Aggravated by use of Eliquis, which she has not stopped taking.  Referring to Orthopedics ([facility]) for evaluation with x rAY OF forearm  for aspiration and management .  Advised to suspend eliquis for one day       Relevant Orders   Ambulatory referral to Orthopedic Surgery    Other Visit Diagnoses    Left elbow pain       Relevant Orders   Ambulatory referral to Orthopedic Surgery      I am having Pamala Hurry A. Strauser maintain her acetaminophen,  aluminum-magnesium hydroxide, Probiotic Product (PROBIOTIC ADVANCED PO), cholecalciferol, Coenzyme Q10 (COQ10 PO), OVER THE COUNTER MEDICATION, metoprolol tartrate, Eliquis, amLODipine, pravastatin, pantoprazole, LORazepam, and traZODone.  No orders of the defined types were placed in this encounter.   There are no discontinued medications.  Follow-up: No follow-ups on file.   [name], MD

## 2019-03-08 NOTE — Patient Instructions (Addendum)
Suspend your Eliquis for 24 hours to allow your elbow to stop  Bleeding   I recommend going to [facility] to have your elbow x rayed again and wrapped.     Continue to ice it for 15 to 30 minutes 4 times daily

## 2019-03-09 ENCOUNTER — Other Ambulatory Visit: Payer: Self-pay | Admitting: Cardiovascular Disease

## 2019-03-09 ENCOUNTER — Other Ambulatory Visit: Payer: Self-pay | Admitting: Internal Medicine

## 2019-03-10 DIAGNOSIS — M25422 Effusion, left elbow: Secondary | ICD-10-CM | POA: Insufficient documentation

## 2019-03-10 NOTE — Assessment & Plan Note (Addendum)
Traumatic.  She has continued pain distally at the ulnar head and the pain is a Aggravated by use of Eliquis, which she has not stopped taking.  Referring to Orthopedics ([facility]) for evaluation with x rays of forearm  To rule out fractures of radial/ulnar heads and manage the effusion with  aspiration and management .  Advised to suspend eliquis for one day

## 2019-03-21 ENCOUNTER — Other Ambulatory Visit: Payer: Self-pay | Admitting: Internal Medicine

## 2019-03-21 DIAGNOSIS — K521 Toxic gastroenteritis and colitis: Secondary | ICD-10-CM

## 2019-03-21 DIAGNOSIS — T3695XA Adverse effect of unspecified systemic antibiotic, initial encounter: Principal | ICD-10-CM

## 2019-03-21 NOTE — Progress Notes (Unsigned)
c diff

## 2019-04-25 DIAGNOSIS — L570 Actinic keratosis: Secondary | ICD-10-CM | POA: Diagnosis not present

## 2019-04-25 DIAGNOSIS — D2230 Melanocytic nevi of unspecified part of face: Secondary | ICD-10-CM | POA: Diagnosis not present

## 2019-04-25 DIAGNOSIS — Z1283 Encounter for screening for malignant neoplasm of skin: Secondary | ICD-10-CM | POA: Diagnosis not present

## 2019-04-25 DIAGNOSIS — L820 Inflamed seborrheic keratosis: Secondary | ICD-10-CM | POA: Diagnosis not present

## 2019-04-25 DIAGNOSIS — L578 Other skin changes due to chronic exposure to nonionizing radiation: Secondary | ICD-10-CM | POA: Diagnosis not present

## 2019-04-25 DIAGNOSIS — D225 Melanocytic nevi of trunk: Secondary | ICD-10-CM | POA: Diagnosis not present

## 2019-04-25 DIAGNOSIS — D229 Melanocytic nevi, unspecified: Secondary | ICD-10-CM | POA: Diagnosis not present

## 2019-05-04 NOTE — Telephone Encounter (Signed)
error 

## 2019-05-08 ENCOUNTER — Other Ambulatory Visit: Payer: Self-pay | Admitting: Cardiovascular Disease

## 2019-05-08 ENCOUNTER — Other Ambulatory Visit: Payer: Self-pay | Admitting: Internal Medicine

## 2019-05-08 NOTE — Telephone Encounter (Signed)
Please review for refill.  

## 2019-05-08 NOTE — Telephone Encounter (Signed)
Prescription refill request received for eliquis.  83 yr Female.  LOV: Arida (02-08-2019) Weight: 76.7kg Crea: 0.91 (02-15-2019) Prescription refill sent.

## 2019-05-08 NOTE — Telephone Encounter (Signed)
The patient saw Dr. Fletcher Anon on 11/14/18 and per his note: "2. Essential hypertension: Blood pressure is well controlled.  I discontinued amlodipine in order to start diltiazem."  He saw her again on 02/08/19 and there was no mention of restarting this.  I do see it on her medication list from Dr. Derrel Nip on her visits from 02/15/19 & 03/08/19.   Please call the patient to verify if she is taking this as I do not see where anyone actually told her to restart amlodipine.

## 2019-05-17 DIAGNOSIS — I10 Essential (primary) hypertension: Secondary | ICD-10-CM | POA: Diagnosis not present

## 2019-05-17 DIAGNOSIS — E7800 Pure hypercholesterolemia, unspecified: Secondary | ICD-10-CM | POA: Diagnosis not present

## 2019-05-17 DIAGNOSIS — N183 Chronic kidney disease, stage 3 (moderate): Secondary | ICD-10-CM | POA: Diagnosis not present

## 2019-05-17 DIAGNOSIS — N2581 Secondary hyperparathyroidism of renal origin: Secondary | ICD-10-CM | POA: Diagnosis not present

## 2019-05-17 DIAGNOSIS — N390 Urinary tract infection, site not specified: Secondary | ICD-10-CM | POA: Diagnosis not present

## 2019-05-23 DIAGNOSIS — N183 Chronic kidney disease, stage 3 (moderate): Secondary | ICD-10-CM | POA: Diagnosis not present

## 2019-05-23 DIAGNOSIS — I129 Hypertensive chronic kidney disease with stage 1 through stage 4 chronic kidney disease, or unspecified chronic kidney disease: Secondary | ICD-10-CM | POA: Diagnosis not present

## 2019-06-01 ENCOUNTER — Telehealth: Payer: Self-pay

## 2019-06-01 NOTE — Telephone Encounter (Signed)

## 2019-06-02 ENCOUNTER — Other Ambulatory Visit: Payer: Self-pay | Admitting: Cardiovascular Disease

## 2019-06-07 ENCOUNTER — Other Ambulatory Visit: Payer: Self-pay

## 2019-06-07 ENCOUNTER — Telehealth (INDEPENDENT_AMBULATORY_CARE_PROVIDER_SITE_OTHER): Payer: Medicare Other | Admitting: Cardiovascular Disease

## 2019-06-07 ENCOUNTER — Encounter: Payer: Self-pay | Admitting: Cardiovascular Disease

## 2019-06-07 VITALS — BP 147/86 | HR 89 | Ht 66.0 in | Wt 165.0 lb

## 2019-06-07 DIAGNOSIS — I10 Essential (primary) hypertension: Secondary | ICD-10-CM | POA: Diagnosis not present

## 2019-06-07 DIAGNOSIS — I482 Chronic atrial fibrillation, unspecified: Secondary | ICD-10-CM | POA: Diagnosis not present

## 2019-06-07 NOTE — Patient Instructions (Signed)
Medication Instructions:  Continue same medications If you need a refill on your cardiac medications before your next appointment, please call your pharmacy.   Lab work: None If you have labs (blood work) drawn today and your tests are completely normal, you will receive your results only by: . MyChart Message (if you have MyChart) OR . A paper copy in the mail If you have any lab test that is abnormal or we need to change your treatment, we will call you to review the results.  Testing/Procedures: None  Follow-Up: At [facility], you and your health needs are our priority.  As part of our continuing mission to provide you with exceptional heart care, we have created designated Provider Care Teams.  These Care Teams include your primary Cardiologist (physician) and Advanced Practice Providers (APPs -  Physician Assistants and Nurse Practitioners) who all work together to provide you with the care you need, when you need it. You will need a follow up appointment in 4 months.  Please call our office 2 months in advance to schedule this appointment.  You may see Muhammad Arida, MD or one of the following Advanced Practice Providers on your designated Care Team:   Christopher Berge, NP Ryan Dunn, PA-C . Jacquelyn Visser, PA-C  

## 2019-06-07 NOTE — Progress Notes (Signed)
Virtual Visit via Telephone Note   This visit type was conducted due to national recommendations for restrictions regarding the COVID-19 Pandemic (e.g. social distancing) in an effort to limit this patient's exposure and mitigate transmission in our community.  Due to her co-morbid illnesses, this patient is at least at moderate risk for complications without adequate follow up.  This format is felt to be most appropriate for this patient at this time.  The patient did not have access to video technology/had technical difficulties with video requiring transitioning to audio format only (telephone).  All issues noted in this document were discussed and addressed.  No physical exam could be performed with this format.  Please refer to the patient's chart for her  consent to telehealth for [facility].   Date:  06/07/2019   ID:  [name] [name], DOB Jan 23, 1936, MRN [id number]  Patient Location: Home Provider Location: Office  PCP:  [name], MD  Cardiologist:  [name], MD  Electrophysiologist:  None   Evaluation Performed:  Follow-Up Visit  Chief Complaint:   Elevated blood pressure  History of Present Illness:    [name] [name] is a 83 y.o. female who was reached via phone for a follow-up visit regarding permanent atrial fibrillation.   She has known history of  atrial fibrillation/flutter status post multiple cardioversions in the past (total of 7). 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 She had cardiac catheterization twice in the past without significant coronary artery disease. Most recent cardioversion was in Feb of 2018. Echocardiogram in March, 2019 showed an EF of 55 to 60% with mildly dilated left atrium and minimal pulmonary hypertension. She is being treated with rate control at the present time.   She has been doing reasonably well with no recent chest pain, shortness of breath or palpitations.  Her headache improved  significantly since last visit. Her blood pressure is elevated today but usually her blood pressure is controlled.  The patient does not have symptoms concerning for COVID-19 infection (fever, chills, cough, or new shortness of breath).    Past Medical History:  Diagnosis Date   Arthritis    Atrial flutter ([place]) 02/2011   s/p cardioversion    Chest pain    a. H/o cardiac cath x 2, last 2012 -->nl cors;  b. 12/2016 MV: EF 61%, small region of mild perfusion defect in the apical anteroseptal region c/w breast attenuation, no ischemia-->Low risk.   CKD (chronic kidney disease), stage III (HCC)    Cystocele    GERD (gastroesophageal reflux disease)    Headache(784.0)    chronic   Hyperlipidemia    Hypertension    Knee fracture    Mobitz type 2 second degree atrioventricular block    a. felt to be 2/2 amiodarone, resolved with decreased amiodarone dose.  Amio since d/c'd.   Persistent atrial fibrillation    a. status post multiple DCCVs; b. 2018 - eval for PVI but opted for rate control.   PONV (postoperative nausea and vomiting)    oxycodone and codiene cause N/V    Pre-syncope    a. In setting of dehydration and AKI in the past.   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 Resolved;  b. 08/2017 Echo: EF 50-55%, no rwma, mild MR, mildly to mod dil LA/RA.   Venous insufficiency    Vertigo    Past Surgical History:  Procedure Laterality Date   ABDOMINAL HYSTERECTOMY  1990   APPENDECTOMY  AUGMENTATION MAMMAPLASTY Bilateral 1986   implants   AUGMENTATION MAMMAPLASTY  1990   AUGMENTATION MAMMAPLASTY  2011   CARDIAC CATHETERIZATION     CARDIOVERSION     x 3   CARDIOVERSION     CARDIOVERSION N/A 02/07/2017   Procedure: CARDIOVERSION;  Surgeon: Wellington Hampshire, MD;  Location: [facility];  Service: Cardiovascular;  Laterality: N/A;   CARDIOVERSION N/A 07/22/2017   Procedure: Cardioversion;  Surgeon: Minna Merritts, MD;  Location: [facility];  Service:  Cardiovascular;  Laterality: N/A;   CATARACT EXTRACTION W/PHACO Right 09/21/2016   Procedure: CATARACT EXTRACTION PHACO AND INTRAOCULAR LENS PLACEMENT ([place]);  Surgeon: Birder Robson, MD;  Location: [facility];  Service: Ophthalmology;  Laterality: Right;  [place] 44.1 AP% 16.5 CDE 7.30 Fluid Pack Lot #[id number]   CATARACT EXTRACTION W/PHACO Left 10/19/2016   Procedure: CATARACT EXTRACTION PHACO AND INTRAOCULAR LENS PLACEMENT (I[facility]);  Surgeon: Birder Robson, MD;  Location: [facility];  Service: Ophthalmology;  Laterality: Left;  [place] 53.7 AP% 19.5 CDE 10.45 Fluid pack lot # [id number]   CHOLECYSTECTOMY     COMBINED AUGMENTATION MAMMAPLASTY AND ABDOMINOPLASTY     JOINT REPLACEMENT Left 06/04/2013   left knee   KNEE ARTHROSCOPY Right 08/16/2016   Procedure: ARTHROSCOPY KNEE, tear posterior horn medial meniscus, tear anterior and posterior horns of lateral meniscus, chondromalacia of lateral compartment grade 3 patella and grade 4 medial;  Surgeon: Dereck Leep, MD;  Location: [facility];  Service: Orthopedics;  Laterality: Right;   MASTECTOMY  1986   nipple sparing mastectomy/Bilateral with silicone  breast implants, s/p saline replacements   Multiple orthopedic procedures     NOSE SURGERY     TEE WITHOUT CARDIOVERSION N/A 09/26/2017   Procedure: TRANSESOPHAGEAL ECHOCARDIOGRAM (TEE);  Surgeon: Fay Records, MD;  Location: [facility] ENDOSCOPY;  Service: Cardiovascular;  Laterality: N/A;   TOTAL KNEE ARTHROPLASTY Left      Current Meds  Medication Sig   acetaminophen (TYLENOL) 500 MG tablet Take 1,000 mg by mouth every 6 (six) hours as needed (pain).    aluminum-magnesium hydroxide 200-200 MG/5ML suspension Take 20 mLs by mouth every 6 (six) hours as needed for indigestion.    amLODipine (NORVASC) 2.5 MG tablet TAKE ONE TABLET BY MOUTH EVERY MORNING AND TAKE TWO TABLETS EVERY EVENING   cholecalciferol (VITAMIN D) 1000 units tablet Take 1,000 Units by mouth daily.   Coenzyme Q10 (COQ10 PO)  Take by mouth daily.   diltiazem (CARDIZEM CD) 180 MG 24 hr capsule TAKE 1 CAPSULE BY MOUTH EVERY EVENING   ELIQUIS 5 MG TABS tablet TAKE ONE TABLET BY MOUTH TWICE DAILY   LORazepam (ATIVAN) 0.5 MG tablet Take 1 tablet (0.5 mg total) by mouth as needed for anxiety.   metoprolol tartrate (LOPRESSOR) 50 MG tablet TAKE ONE TABLET 3 TIMES DAILY   OVER THE COUNTER MEDICATION Saline nasal spray   pantoprazole (PROTONIX) 40 MG tablet TAKE ONE TABLET BY MOUTH EVERY DAY   PARoxetine (PAXIL) 10 MG tablet TAKE ONE TABLET BY MOUTH EVERY DAY   pravastatin (PRAVACHOL) 20 MG tablet TAKE ONE TABLET BY MOUTH EVERY DAY   Probiotic Product (PROBIOTIC ADVANCED PO) Take 1 tablet by mouth daily.   traZODone (DESYREL) 50 MG tablet Take 1 tablet (50 mg total) by mouth at bedtime.     Allergies:   Iodine, Amiodarone, Biaxin [clarithromycin], Codeine, Digoxin and related, Enalapril, Fluocinonide, Iodinated diagnostic agents, Oxycodone, Promethazine, Tizanidine hcl, Warfarin and related, and Xarelto [rivaroxaban]   Social History   Tobacco Use  Smoking status: Never Smoker   Smokeless tobacco: Never Used  Substance Use Topics   Alcohol use: No   Drug use: No     Family Hx: The patient's family history includes Breast cancer in her maternal grandmother, sister, sister, and sister; Cancer in her father, maternal grandmother, sister, sister, and sister; Diabetes in her brother; Heart disease in her mother and son. There is no history of Ovarian cancer.  ROS:   Please see the history of present illness.     All other systems reviewed and are negative.   Prior CV studies:   The following studies were reviewed today:    Labs/Other Tests and Data Reviewed:    EKG:  No ECG reviewed.  Recent Labs: 02/12/2019: ALT 32; Hemoglobin 14.3; Platelets 263 02/15/2019: BUN 20; Creatinine, Ser 0.91; Potassium 4.7; Sodium 135   Recent Lipid Panel Lab Results  Component Value Date/Time   CHOL 147  07/22/2017 01:32 PM   TRIG 51 07/22/2017 01:32 PM   HDL 63 07/22/2017 01:32 PM   CHOLHDL 2.3 07/22/2017 01:32 PM   LDLCALC 74 07/22/2017 01:32 PM    Wt Readings from Last 3 Encounters:  06/07/19 165 lb (74.8 kg)  03/08/19 169 lb (76.7 kg)  02/15/19 169 lb (76.7 kg)     Objective:    Vital Signs:  BP (!) 147/86    Pulse 89    Ht 5\' 6"  (1.676 m)    Wt 165 lb (74.8 kg)    BMI 26.63 kg/m    VITAL SIGNS:  reviewed  ASSESSMENT & PLAN:    1.  Permanent atrial fibrillation: Ventricular rate seems to be reasonably controlled now with diltiazem and metoprolol.  She is tolerating anticoagulation with Eliquis.    Most recent labs in February were unremarkable.  2. Essential hypertension: Blood pressure is mildly elevated but usually her blood pressure is more controlled.  Continue to monitor for now and we can consider adding an ARB if needed.  3. History of tachycardia-induced cardiomyopathy. Most recent ejection fraction was 55%.  4. Left carotid calcifications: Carotid Doppler in August of 2018 showed mild nonobstructive disease.   COVID-19 Education: The signs and symptoms of COVID-19 were discussed with the patient and how to seek care for testing (follow up with PCP or arrange E-visit).  The importance of social distancing was discussed today.  Time:   Today, I have spent 10 minutes with the patient with telehealth technology discussing the above problems.     Medication Adjustments/Labs and Tests Ordered: Current medicines are reviewed at length with the patient today.  Concerns regarding medicines are outlined above.   Tests Ordered: No orders of the defined types were placed in this encounter.   Medication Changes: No orders of the defined types were placed in this encounter.   Follow Up:  In Person in 4 month(s)  Signed, [name], MD  06/07/2019 9:08 AM    [place]

## 2019-06-11 ENCOUNTER — Telehealth: Payer: Self-pay | Admitting: Cardiovascular Disease

## 2019-06-11 ENCOUNTER — Other Ambulatory Visit: Payer: Self-pay

## 2019-06-11 ENCOUNTER — Encounter: Payer: Self-pay | Admitting: Internal Medicine

## 2019-06-11 ENCOUNTER — Telehealth: Payer: Self-pay | Admitting: Internal Medicine

## 2019-06-11 ENCOUNTER — Ambulatory Visit (INDEPENDENT_AMBULATORY_CARE_PROVIDER_SITE_OTHER): Payer: Medicare Other | Admitting: Internal Medicine

## 2019-06-11 DIAGNOSIS — Z20828 Contact with and (suspected) exposure to other viral communicable diseases: Secondary | ICD-10-CM | POA: Diagnosis not present

## 2019-06-11 DIAGNOSIS — Z20822 Contact with and (suspected) exposure to covid-19: Secondary | ICD-10-CM

## 2019-06-11 NOTE — Assessment & Plan Note (Addendum)
Her son came into contact with her when he was not feeling well and tested positive.  She will need to be tested on Wednesday . She was advised to self quarantine until her covid screening test has been resulted

## 2019-06-11 NOTE — Telephone Encounter (Signed)
Pt called this morning. Her son has tested positive for COVID-19. Son was around her and husband. She has the following symptoms; sore throat off and on, some abdominal pain, loss stool for the past 2 days, Sunday started headache, some weakness, and some shortness of breathe. Pt has been scheduled for a telephone visit on Wednesday, 06/13/2019. There are no available appointments for today or Tuesday.

## 2019-06-11 NOTE — Telephone Encounter (Signed)
I spoke with the patient. She states that her son brought her groceries on Saturday and did not know that he was positive for COVID-19 at the time.  Her husband is currently in the hospital and awaiting COVID results.   I have advised the patient that we cannot see her in our office until she contacts her PCP for COVID testing.  Per the patient, she is awaiting a telehealth visit today with Dr.Tullo. I have advised her she will notify her how to obtain testing at that time if she feels this is appropriate.   She is aware we will follow along and touch base with her once COVID testing is hopefully obtained.   The patient voices understanding and is agreeable.

## 2019-06-11 NOTE — Telephone Encounter (Signed)
Patient wants to be evaluated by Dr. Fletcher Anon asap for generized weakness and fatigue.  Recall for October and patient declines app appt. See screening below patient advised to also call pcp office for covid testing and advice.      COVID-19 Pre-Screening Questions:   In the past 7 to 10 days have you had a cough,  shortness of breath, headache, congestion, fever (100 or greater) body aches, chills, sore throat, or sudden loss of taste or sense of smell?  YES - Congestion headache   Have you been around anyone with known Covid 19. YES  Have you been around anyone who is awaiting Covid 19 test results in the past 7 to 10 days? YES  Have you been around anyone who has been exposed to Covid 19, or has mentioned symptoms of Covid 19 within the past 7 to 10 days? YES   If you have any concerns/questions about symptoms patients report during screening (either on the phone or at threshold). Contact the provider seeing the patient or DOD for further guidance.  If neither are available contact a member of the leadership team.

## 2019-06-11 NOTE — Progress Notes (Deleted)
TELEPHONE Note  This visit type was conducted due to national recommendations for restrictions regarding the COVID-19 pandemic (e.g. social distancing).  This format is felt to be most appropriate for this patient at this time.  All issues noted in this document were discussed and addressed.  No physical exam was performed (except for noted visual exam findings with Video Visits).   I connected with@ on 06/11/19 at  4:00 PM EDT by a video enabled telemedicine application or telephone and verified that I am speaking with the correct person using two identifiers. Location patient: home Location provider: work or home office Persons participating in the virtual visit: patient, provider  I discussed the limitations, risks, security and privacy concerns of performing an evaluation and management service by telephone and the availability of in person appointments. I also discussed with the patient that there may be a patient responsible charge related to this service. The patient expressed understanding and agreed to proceed.  Interactive audio and video telecommunications were attempted between this provider and patient, however failed, due to patient having technical difficulties OR patient did not have access to video capability.  We continued and completed visit with audio only. ***  Reason for visit: COVID EXPOSURE  Symptomatic   HPI:  Son and grandson both infected and son visited her to drop off groceries wile symptomatic before he had been advised of his results .  The contact with son occurred Saturday night (3 days ago) , he bought them groceries and came into the house to carry them in.   Feels weak and tired, had a severe  Headache the night before last but has been doing the yardwork bc husband has been declining in health. .  husband was tested on Saturday during hospitalization and was negative .  She has been afebrile .  Discussed getting covid testing on 24   iin the afternoon,   Husband has a hospital follow up with me on 11:00     ROS: See pertinent positives and negatives per HPI.  Past Medical History:  Diagnosis Date  . Arthritis   . Atrial flutter ([place]) 02/2011   s/p cardioversion   . Chest pain    a. H/o cardiac cath x 2, last 2012 -->nl cors;  b. 12/2016 MV: EF 61%, small region of mild perfusion defect in the apical anteroseptal region c/w breast attenuation, no ischemia-->Low risk.  . CKD (chronic kidney disease), stage III ([place])   . Cystocele   . GERD (gastroesophageal reflux disease)   . Headache(784.0)    chronic  . Hyperlipidemia   . Hypertension   . Knee fracture   . Mobitz type 2 second degree atrioventricular block    a. felt to be 2/2 amiodarone, resolved with decreased amiodarone dose.  Amio since d/c'd.  . Persistent atrial fibrillation    a. status post multiple DCCVs; b. 2018 - eval for PVI but opted for rate control.  [name] PONV (postoperative nausea and vomiting)    oxycodone and codiene cause N/V   . Pre-syncope    a. In setting of dehydration and AKI in the past.  . Tachycardia induced cardiomyopathy ([place])    a. Resolved;  b. 08/2017 Echo: EF 50-55%, no rwma, mild MR, mildly to mod dil LA/RA.  [name] Venous insufficiency   . Vertigo     Past Surgical History:  Procedure Laterality Date  . ABDOMINAL HYSTERECTOMY  1990  . APPENDECTOMY    . AUGMENTATION MAMMAPLASTY Bilateral 1986   implants  .  AUGMENTATION MAMMAPLASTY  1990  . AUGMENTATION MAMMAPLASTY  2011  . CARDIAC CATHETERIZATION    . CARDIOVERSION     x 3  . CARDIOVERSION    . CARDIOVERSION N/A 02/07/2017   Procedure: CARDIOVERSION;  Surgeon: Wellington Hampshire, MD;  Location: [facility] ORS;  Service: Cardiovascular;  Laterality: N/A;  . CARDIOVERSION N/A 07/22/2017   Procedure: Cardioversion;  Surgeon: Minna Merritts, MD;  Location: [facility] ORS;  Service: Cardiovascular;  Laterality: N/A;  . CATARACT EXTRACTION W/PHACO Right 09/21/2016   Procedure: CATARACT EXTRACTION PHACO AND  INTRAOCULAR LENS PLACEMENT ([place]);  Surgeon: Birder Robson, MD;  Location: [facility] ORS;  Service: Ophthalmology;  Laterality: Right;  [place] 44.1 AP% 16.5 CDE 7.30 Fluid Pack Lot #[id number]  . CATARACT EXTRACTION W/PHACO Left 10/19/2016   Procedure: CATARACT EXTRACTION PHACO AND INTRAOCULAR LENS PLACEMENT (IOC);  Surgeon: Birder Robson, MD;  Location: [facility] ORS;  Service: Ophthalmology;  Laterality: Left;  [place] 53.7 AP% 19.5 CDE 10.45 Fluid pack lot # [id number]  . CHOLECYSTECTOMY    . COMBINED AUGMENTATION MAMMAPLASTY AND ABDOMINOPLASTY    . JOINT REPLACEMENT Left 06/04/2013   left knee  . KNEE ARTHROSCOPY Right 08/16/2016   Procedure: ARTHROSCOPY KNEE, tear posterior horn medial meniscus, tear anterior and posterior horns of lateral meniscus, chondromalacia of lateral compartment grade 3 patella and grade 4 medial;  Surgeon: Dereck Leep, MD;  Location: [facility] ORS;  Service: Orthopedics;  Laterality: Right;  . MASTECTOMY  1986   nipple sparing mastectomy/Bilateral with silicone  breast implants, s/p saline replacements  . Multiple orthopedic procedures    . NOSE SURGERY    . TEE WITHOUT CARDIOVERSION N/A 09/26/2017   Procedure: TRANSESOPHAGEAL ECHOCARDIOGRAM (TEE);  Surgeon: Fay Records, MD;  Location: [facility] ENDOSCOPY;  Service: Cardiovascular;  Laterality: N/A;  . TOTAL KNEE ARTHROPLASTY Left     Family History  Problem Relation Age of Onset  . Heart disease Mother   . Cancer Father        stomach  . Heart disease Son        found at autopsy  . Cancer Sister        breast  . Breast cancer Sister   . Cancer Maternal Grandmother        breast  . Breast cancer Maternal Grandmother   . Cancer Sister        breast  . Breast cancer Sister   . Breast cancer Sister   . Cancer Sister        breast  . Diabetes Brother   . Ovarian cancer Neg Hx     SOCIAL HX:  reports that she has never smoked. She has never used smokeless tobacco. She reports that she does not drink alcohol or use  drugs.   Current Outpatient Medications:  .  acetaminophen (TYLENOL) 500 MG tablet, Take 1,000 mg by mouth every 6 (six) hours as needed (pain). , Disp: , Rfl:  .  aluminum-magnesium hydroxide 200-200 MG/5ML suspension, Take 20 mLs by mouth every 6 (six) hours as needed for indigestion. , Disp: , Rfl:  .  cholecalciferol (VITAMIN D) 1000 units tablet, Take 1,000 Units by mouth daily., Disp: , Rfl:  .  Coenzyme Q10 (COQ10 PO), Take by mouth daily., Disp: , Rfl:  .  diltiazem (CARDIZEM CD) 180 MG 24 hr capsule, TAKE 1 CAPSULE BY MOUTH EVERY EVENING, Disp: 30 capsule, Rfl: 1 .  ELIQUIS 5 MG TABS tablet, TAKE ONE TABLET BY MOUTH TWICE DAILY, Disp: 60 tablet, Rfl: 5 .  LORazepam (ATIVAN) 0.5 MG tablet, Take 1 tablet (0.5 mg total) by mouth as needed for anxiety., Disp: 30 tablet, Rfl: 0 .  metoprolol tartrate (LOPRESSOR) 50 MG tablet, TAKE ONE TABLET 3 TIMES DAILY, Disp: 270 tablet, Rfl: 0 .  OVER THE COUNTER MEDICATION, Saline nasal spray, Disp: , Rfl:  .  pantoprazole (PROTONIX) 40 MG tablet, TAKE ONE TABLET BY MOUTH EVERY DAY, Disp: 30 tablet, Rfl: 6 .  PARoxetine (PAXIL) 10 MG tablet, TAKE ONE TABLET BY MOUTH EVERY DAY, Disp: 90 tablet, Rfl: 1 .  pravastatin (PRAVACHOL) 20 MG tablet, TAKE ONE TABLET BY MOUTH EVERY DAY, Disp: 90 tablet, Rfl: 1 .  Probiotic Product (PROBIOTIC ADVANCED PO), Take 1 tablet by mouth daily., Disp: , Rfl:  .  traZODone (DESYREL) 50 MG tablet, Take 1 tablet (50 mg total) by mouth at bedtime., Disp: 30 tablet, Rfl: 0  EXAM:   General impression: alert, cooperative and articulate.  No signs of being in distress  Lungs: speech is fluent sentence length suggests that patient is not short of breath and not punctuated by cough, sneezing or sniffing. [name]   Psych: affect normal.  speech is articulate and non pressured .  Denies suicidal thoughts   ASSESSMENT AND PLAN:   Close Exposure to Covid-19 Virus Her son came into contact with her when he was not feeling well and  tested positive.  She will need to be tested on Wednesday . She was advised to self quarantine until her covid screening test has been resulted     I discussed the assessment and treatment plan with the patient. The patient was provided an opportunity to ask questions and all were answered. The patient agreed with the plan and demonstrated an understanding of the instructions.   The patient was advised to call back or seek an in-person evaluation if the symptoms worsen or if the condition fails to improve as anticipated.  I provided 22 minutes of non-face-to-face time during this encounter.   [name], MD

## 2019-06-11 NOTE — Progress Notes (Signed)
Pt's great grandson tested positive and then her son and daughter in law was tested on Saturday because they didn't feel good so the result for both them came back on Sunday as positive. The pt stated that on Saturday her son had brought them so groceries over and told her that he didn't feel good he was going to go back home. Pt stated that he walked in the door long enough to deliver the groceries. Pt stated that she is weaker than normal, fatigued, hoarse, "terrible" headache.

## 2019-06-12 ENCOUNTER — Other Ambulatory Visit: Payer: Medicare Other

## 2019-06-12 ENCOUNTER — Telehealth: Payer: Self-pay | Admitting: *Deleted

## 2019-06-12 DIAGNOSIS — Z20822 Contact with and (suspected) exposure to covid-19: Secondary | ICD-10-CM

## 2019-06-12 NOTE — Telephone Encounter (Addendum)
Contacted pt to schedule COVID testing; pt accepted appointment at [facility] site 06/13/2019 at 1300; pt given address, location, and instructions that she and all occupants of her vehicle should wear masks; she verbalized understanding; orders placed per protocol. ----- Message from [name], MD sent at 06/11/2019 11:16 PM EDT ----- Covid 19 exposure to sick son occurred on Saturday ,  husband Seila Liston ws also exposed.  Both Need testing Wednesday afternoon Jun 24 or thursday

## 2019-06-13 ENCOUNTER — Ambulatory Visit: Payer: Medicare Other | Admitting: Internal Medicine

## 2019-06-13 ENCOUNTER — Other Ambulatory Visit: Payer: Medicare Other

## 2019-06-13 DIAGNOSIS — Z20822 Contact with and (suspected) exposure to covid-19: Secondary | ICD-10-CM

## 2019-06-13 DIAGNOSIS — R6889 Other general symptoms and signs: Secondary | ICD-10-CM | POA: Diagnosis not present

## 2019-06-13 NOTE — Telephone Encounter (Signed)
Patient is pending a COVID test today.

## 2019-06-14 NOTE — Progress Notes (Signed)
Telephone Note  This visit type was conducted due to national recommendations for restrictions regarding the COVID-19 pandemic (e.g. social distancing).  This format is felt to be most appropriate for this patient at this time.  All issues noted in this document were discussed and addressed.  No physical exam was performed (except for noted visual exam findings with Video Visits).   I connected with@ on 06/11/19 at  4:00 PM EDT by telephone and verified that I am speaking with the correct person using two identifiers. Location patient: home Location provider: work or home office Persons participating in the virtual visit: patient, provider  I discussed the limitations, risks, security and privacy concerns of performing an evaluation and management service by telephone and the availability of in person appointments. I also discussed with the patient that there may be a patient responsible charge related to this service. The patient expressed understanding and agreed to proceed.  Reason for visit: exposure to PATIENT infected with COVID 19 HPI:  83 yr old female presents with a history of brief  exposure to son on Saturday who was feeling ill at the time  And later tested positive for COVID 19.  The patient's son dropped off groceries for patient and patient's husband who had just been [facility] from [facility] and has tested negative for covid 19 while hospitalized.   The patient has no signs or symptoms of COVID 19 infection (fever, cough, sore throat  or shortness of breath beyond what is typical for patient)., but she does have a mild headache which is not unusual for her.   ROS: Patient denies fevers, malaise, unintentional weight loss, skin rash, eye pain, sinus congestion and sinus pain, sore throat, dysphagia,  hemoptysis , cough, dyspnea, wheezing, chest pain, palpitations, orthopnea, edema, abdominal pain, nausea, melena, diarrhea, constipation, flank pain, dysuria, hematuria, urinary  Frequency,  nocturia, numbness, tingling, seizures,  Focal weakness, Loss of consciousness,  Tremor, insomnia, depression, anxiety, and suicidal ideation.      Past Medical History:  Diagnosis Date  . Arthritis   . Atrial flutter ([place]) 02/2011   s/p cardioversion   . Chest pain    a. H/o cardiac cath x 2, last 2012 -->nl cors;  b. 12/2016 MV: EF 61%, small region of mild perfusion defect in the apical anteroseptal region c/w breast attenuation, no ischemia-->Low risk.  . CKD (chronic kidney disease), stage III ([place])   . Cystocele   . GERD (gastroesophageal reflux disease)   . Headache(784.0)    chronic  . Hyperlipidemia   . Hypertension   . Knee fracture   . Mobitz type 2 second degree atrioventricular block    a. felt to be 2/2 amiodarone, resolved with decreased amiodarone dose.  Amio since d/c'd.  . Persistent atrial fibrillation    a. status post multiple DCCVs; b. 2018 - eval for PVI but opted for rate control.  [name] PONV (postoperative nausea and vomiting)    oxycodone and codiene cause N/V   . Pre-syncope    a. In setting of dehydration and AKI in the past.  . Tachycardia induced cardiomyopathy ([place])    a. Resolved;  b. 08/2017 Echo: EF 50-55%, no rwma, mild MR, mildly to mod dil LA/RA.  [name] Venous insufficiency   . Vertigo     Past Surgical History:  Procedure Laterality Date  . ABDOMINAL HYSTERECTOMY  1990  . APPENDECTOMY    . AUGMENTATION MAMMAPLASTY Bilateral 1986   implants  . AUGMENTATION MAMMAPLASTY  1990  . AUGMENTATION  MAMMAPLASTY  2011  . CARDIAC CATHETERIZATION    . CARDIOVERSION     x 3  . CARDIOVERSION    . CARDIOVERSION N/A 02/07/2017   Procedure: CARDIOVERSION;  Surgeon: Wellington Hampshire, MD;  Location: [facility] ORS;  Service: Cardiovascular;  Laterality: N/A;  . CARDIOVERSION N/A 07/22/2017   Procedure: Cardioversion;  Surgeon: Minna Merritts, MD;  Location: [facility] ORS;  Service: Cardiovascular;  Laterality: N/A;  . CATARACT EXTRACTION W/PHACO Right 09/21/2016    Procedure: CATARACT EXTRACTION PHACO AND INTRAOCULAR LENS PLACEMENT ([place]);  Surgeon: Birder Robson, MD;  Location: [facility] ORS;  Service: Ophthalmology;  Laterality: Right;  [place] 44.1 AP% 16.5 CDE 7.30 Fluid Pack Lot #[id number]  . CATARACT EXTRACTION W/PHACO Left 10/19/2016   Procedure: CATARACT EXTRACTION PHACO AND INTRAOCULAR LENS PLACEMENT (I[place]);  Surgeon: Birder Robson, MD;  Location: [facility] ORS;  Service: Ophthalmology;  Laterality: Left;  [place] 53.7 AP% 19.5 CDE 10.45 Fluid pack lot # [id number]  . CHOLECYSTECTOMY    . COMBINED AUGMENTATION MAMMAPLASTY AND ABDOMINOPLASTY    . JOINT REPLACEMENT Left 06/04/2013   left knee  . KNEE ARTHROSCOPY Right 08/16/2016   Procedure: ARTHROSCOPY KNEE, tear posterior horn medial meniscus, tear anterior and posterior horns of lateral meniscus, chondromalacia of lateral compartment grade 3 patella and grade 4 medial;  Surgeon: Dereck Leep, MD;  Location: [facility] ORS;  Service: Orthopedics;  Laterality: Right;  . MASTECTOMY  1986   nipple sparing mastectomy/Bilateral with silicone  breast implants, s/p saline replacements  . Multiple orthopedic procedures    . NOSE SURGERY    . TEE WITHOUT CARDIOVERSION N/A 09/26/2017   Procedure: TRANSESOPHAGEAL ECHOCARDIOGRAM (TEE);  Surgeon: Fay Records, MD;  Location: [facility] ENDOSCOPY;  Service: Cardiovascular;  Laterality: N/A;  . TOTAL KNEE ARTHROPLASTY Left     Family History  Problem Relation Age of Onset  . Heart disease Mother   . Cancer Father        stomach  . Heart disease Son        found at autopsy  . Cancer Sister        breast  . Breast cancer Sister   . Cancer Maternal Grandmother        breast  . Breast cancer Maternal Grandmother   . Cancer Sister        breast  . Breast cancer Sister   . Breast cancer Sister   . Cancer Sister        breast  . Diabetes Brother   . Ovarian cancer Neg Hx     SOCIAL HX:  Social History   Tobacco Use  . Smoking status: Never Smoker  . Smokeless tobacco:  Never Used  Substance Use Topics  . Alcohol use: No  . Drug use: No     Current Outpatient Medications:  .  acetaminophen (TYLENOL) 500 MG tablet, Take 1,000 mg by mouth every 6 (six) hours as needed (pain). , Disp: , Rfl:  .  aluminum-magnesium hydroxide 200-200 MG/5ML suspension, Take 20 mLs by mouth every 6 (six) hours as needed for indigestion. , Disp: , Rfl:  .  cholecalciferol (VITAMIN D) 1000 units tablet, Take 1,000 Units by mouth daily., Disp: , Rfl:  .  Coenzyme Q10 (COQ10 PO), Take by mouth daily., Disp: , Rfl:  .  diltiazem (CARDIZEM CD) 180 MG 24 hr capsule, TAKE 1 CAPSULE BY MOUTH EVERY EVENING, Disp: 30 capsule, Rfl: 1 .  ELIQUIS 5 MG TABS tablet, TAKE ONE TABLET BY MOUTH TWICE DAILY, Disp:  60 tablet, Rfl: 5 .  LORazepam (ATIVAN) 0.5 MG tablet, Take 1 tablet (0.5 mg total) by mouth as needed for anxiety., Disp: 30 tablet, Rfl: 0 .  metoprolol tartrate (LOPRESSOR) 50 MG tablet, TAKE ONE TABLET 3 TIMES DAILY, Disp: 270 tablet, Rfl: 0 .  OVER THE COUNTER MEDICATION, Saline nasal spray, Disp: , Rfl:  .  pantoprazole (PROTONIX) 40 MG tablet, TAKE ONE TABLET BY MOUTH EVERY DAY, Disp: 30 tablet, Rfl: 6 .  PARoxetine (PAXIL) 10 MG tablet, TAKE ONE TABLET BY MOUTH EVERY DAY, Disp: 90 tablet, Rfl: 1 .  pravastatin (PRAVACHOL) 20 MG tablet, TAKE ONE TABLET BY MOUTH EVERY DAY, Disp: 90 tablet, Rfl: 1 .  Probiotic Product (PROBIOTIC ADVANCED PO), Take 1 tablet by mouth daily., Disp: , Rfl:  .  traZODone (DESYREL) 50 MG tablet, Take 1 tablet (50 mg total) by mouth at bedtime., Disp: 30 tablet, Rfl: 0  EXAM:  General impression: alert, cooperative and articulate.  No signs of being in distress  Lungs: speech is fluent sentence length suggests that patient is not short of breath and not punctuated by cough, sneezing or sniffing. [name]   Psych: affect normal.  speech is articulate and non pressured .  Denies suicidal thoughts    ASSESSMENT AND PLAN:  Discussed the following assessment and  plan:  Close Exposure to Covid-19 Virus  Close Exposure to Covid-19 Virus Her son came into contact with her when he was not feeling well and tested positive.  She will need to be tested on Wednesday . She was advised to self quarantine until her covid screening test has been resulted     I discussed the assessment and treatment plan with the patient. The patient was provided an opportunity to ask questions and all were answered. The patient agreed with the plan and demonstrated an understanding of the instructions.   The patient was advised to call back or seek an in-person evaluation if the symptoms worsen or if the condition fails to improve as anticipated.  I provided 15  minutes of non-face-to-face time during this encounter.   [name], MD

## 2019-06-18 LAB — NOVEL CORONAVIRUS, NAA: SARS-CoV-2, NAA: NOT DETECTED

## 2019-06-18 NOTE — Telephone Encounter (Signed)
Call to patient for update of sx, following negative CV 19 result.  She reports that sx of fatigue and weakness have improved over the weekend.   For now, she would like to hold off on scheduling and see how she feels.   Pt reports that she had been doing a lot of work outside in the heat since her husband was ill, in the hospital, and unable to do so.   She will call us back if sx return.   Advised pt to call for any further questions or concerns.

## 2019-06-27 ENCOUNTER — Telehealth: Payer: Self-pay

## 2019-06-27 NOTE — Telephone Encounter (Signed)
LMTCB

## 2019-06-27 NOTE — Telephone Encounter (Signed)
Copied from [place] [id number]. Topic: Referral - Request for Referral >> Jun 27, 2019 11:05 AM Alanda Slim E wrote: Has patient seen PCP for this complaint? Yes  *If NO, is insurance requiring patient see PCP for this issue before PCP can refer them? Referral for which specialty: Pulmonary Preferred provider/office: Dr. Alva Garnet [facility]/ [phone] Reason for referral: shortness of breath and strangling cough

## 2019-06-27 NOTE — Telephone Encounter (Signed)
Is returning call to [facility], please call pt.

## 2019-06-27 NOTE — Telephone Encounter (Signed)
If  All of her symptoms are brought on by exertion, then she should make an appointment with her cardiologlist or me to be seen  , it cannot be virtual  bc she needs a lung and heart exam

## 2019-06-27 NOTE — Telephone Encounter (Signed)
Called and spoke to patient.  Patient said that she has been feeling tired lately and has some coughing and shortness of breath that comes and goes especially when she walks up and down steps.  Patient says that she has shallow breathing.  Patient denied having shortness of breath while on the phone.    Patient said that her husband recently had surgery and she has been doing extra work around the house and is not sure if that is causing her symptoms.  Patient said that both her and her husband were tested for COVID due to required testing for getting hospital procedure done.  Pt said both tests were negative for COVID.  (Test result was given last Tuesday).    Patient asked about a referral to Dr. Alva Garnet at [facility] but not sure if she first needs to follow-up with her cardiologist.  Informed patient that notes will be forwarded to PCP for review and recommendations.  Advised patient to go to [facility] or ED if symptoms worsen.

## 2019-06-28 ENCOUNTER — Telehealth: Payer: Self-pay | Admitting: Cardiovascular Disease

## 2019-06-28 NOTE — Telephone Encounter (Signed)
Pt has an appt with cardiology tomorrow at 2pm.

## 2019-06-28 NOTE — Telephone Encounter (Signed)
    COVID-19 Pre-Screening Questions:  . In the past 7 to 10 days have you had a cough,  shortness of breath, headache, congestion, fever (100 or greater) body aches, chills, sore throat, or sudden loss of taste or sense of smell? Not new SOB, but worsened over the past 2 days, no to all other . Have you been around anyone with known Covid 19. no . Have you been around anyone who is awaiting Covid 19 test results in the past 7 to 10 days? no . Have you been around anyone who has been exposed to Covid 19, or has mentioned symptoms of Covid 19 within the past 7 to 10 days? no  If you have any concerns/questions about symptoms patients report during screening (either on the phone or at threshold). Contact the provider seeing the patient or DOD for further guidance.  If neither are available contact a member of the leadership team.

## 2019-06-28 NOTE — Telephone Encounter (Signed)
Yes, tomorrow with Gerald Stabs is fine.

## 2019-06-28 NOTE — Telephone Encounter (Signed)
Cal to patient to confirm POC, okay per Dr. Fletcher Anon.   Pt verbalized understanding and agreeable to POC.   I urged her to seek urgent medical care if BP >871 systolic or symptomatic of chest tightness worsens or changes.   Advised pt to call for any further questions or concerns.

## 2019-06-28 NOTE — Telephone Encounter (Signed)
Pt c/o BP issue: STAT if pt c/o blurred vision, one-sided weakness or slurred speech  1. What are your last 5 BP readings?  7/6 146/75 7/9 149/85 hr 88   2. Are you having any other symptoms (ex. Dizziness, headache, blurred vision, passed out)? SOB, chest tightness  3. What is your BP issue? BP has been high in the mornings, which is not normal for her.  Patient really feels she needs to come in to see Dr Fletcher Anon today.  Please call to discuss.

## 2019-06-28 NOTE — Telephone Encounter (Signed)
Call to patient, she reports BP medication is not controlling pressure as usual.   For 2 days she is having inc SOB with exertion and chest tightness. She reports hx of indigestion and has been belching. Pt describes it as "mild".   BP this am is 138/86 after medication.  7/6 146/75 7/9 149/85 HR 88  Pt states that she would like to avoid ED at all costs. I offered first available appt with Ignacia Bayley, NP tomorrow at 2. Pt reports that she would rather see Fletcher Anon today if possible. I let her know that he is fully booked today.   I told pt to seek urgent medical care if she tightness changes or worsens.  Routed to provider to further advise.

## 2019-06-29 ENCOUNTER — Encounter: Payer: Self-pay | Admitting: Nurse Practitioner

## 2019-06-29 ENCOUNTER — Other Ambulatory Visit: Payer: Self-pay

## 2019-06-29 ENCOUNTER — Ambulatory Visit (INDEPENDENT_AMBULATORY_CARE_PROVIDER_SITE_OTHER): Payer: Medicare Other | Admitting: Nurse Practitioner

## 2019-06-29 VITALS — BP 142/82 | HR 88 | Ht 64.0 in | Wt 164.0 lb

## 2019-06-29 DIAGNOSIS — R0609 Other forms of dyspnea: Secondary | ICD-10-CM

## 2019-06-29 DIAGNOSIS — E782 Mixed hyperlipidemia: Secondary | ICD-10-CM | POA: Diagnosis not present

## 2019-06-29 DIAGNOSIS — R079 Chest pain, unspecified: Secondary | ICD-10-CM

## 2019-06-29 DIAGNOSIS — I10 Essential (primary) hypertension: Secondary | ICD-10-CM | POA: Diagnosis not present

## 2019-06-29 DIAGNOSIS — R0600 Dyspnea, unspecified: Secondary | ICD-10-CM

## 2019-06-29 MED ORDER — METOPROLOL TARTRATE 100 MG PO TABS: 100.0000 mg | ORAL_TABLET | Freq: Two times a day (BID) | ORAL | 3 refills | Status: DC

## 2019-06-29 NOTE — Progress Notes (Signed)
Office Visit    Patient Name: [name] [name] Date of Encounter: 06/29/2019  Primary Care Provider:  [name], MD Primary Cardiologist:  [name], MD  Chief Complaint    83 year old female with a history of permanent atrial fibrillation and flutter, hypertension, hyperlipidemia, and remote tachycardia induced cardiomyopathy, who presents for follow-up related to permanent chest pressure and dyspnea.  Past Medical History    Past Medical History:  Diagnosis Date   Arthritis    Atrial flutter ([place]) 02/2011   s/p cardioversion    Chest pain    a. H/o cardiac cath x 2, last 2012 -->nl cors;  b. 12/2016 MV: EF 61%, small region of mild perfusion defect in the apical anteroseptal region c/w breast attenuation, no ischemia-->Low risk.   CKD (chronic kidney disease), stage III (HCC)    Cystocele    GERD (gastroesophageal reflux disease)    Headache(784.0)    chronic   Hyperlipidemia    Hypertension    Knee fracture    Mobitz type 2 second degree atrioventricular block    a. felt to be 2/2 amiodarone, resolved with decreased amiodarone dose.  Amio since d/c'd.   Permanent atrial fibrillation    a. status post multiple DCCVs; b. 2018 - eval for PVI but opted for rate control.   PONV (postoperative nausea and vomiting)    oxycodone and codiene cause N/V    Pre-syncope    a. In setting of dehydration and AKI in the past.   Tachycardia induced cardiomyopathy ([place])    a. Resolved;  b. 08/2017 Echo: EF 50-55%, no rwma, mild MR, mildly to mod dil LA/RA; c. 02/2018 Echo: EF 55-60%, mild MR. Mildly dil LA. Nl RVSP. PASP 53mmHg.   Venous insufficiency    Vertigo    Past Surgical History:  Procedure Laterality Date   ABDOMINAL HYSTERECTOMY  1990   APPENDECTOMY     AUGMENTATION MAMMAPLASTY Bilateral 1986   implants   AUGMENTATION MAMMAPLASTY  1990   AUGMENTATION MAMMAPLASTY  2011   CARDIAC CATHETERIZATION     CARDIOVERSION     x 3   CARDIOVERSION      CARDIOVERSION N/A 02/07/2017   Procedure: CARDIOVERSION;  Surgeon: Wellington Hampshire, MD;  Location: [facility];  Service: Cardiovascular;  Laterality: N/A;   CARDIOVERSION N/A 07/22/2017   Procedure: Cardioversion;  Surgeon: Minna Merritts, MD;  Location: [facility];  Service: Cardiovascular;  Laterality: N/A;   CATARACT EXTRACTION W/PHACO Right 09/21/2016   Procedure: CATARACT EXTRACTION PHACO AND INTRAOCULAR LENS PLACEMENT ([place]);  Surgeon: Birder Robson, MD;  Location: [facility];  Service: Ophthalmology;  Laterality: Right;  [place] 44.1 AP% 16.5 CDE 7.30 Fluid Pack Lot #[id number]   CATARACT EXTRACTION W/PHACO Left 10/19/2016   Procedure: CATARACT EXTRACTION PHACO AND INTRAOCULAR LENS PLACEMENT (I[facility]);  Surgeon: Birder Robson, MD;  Location: [facility];  Service: Ophthalmology;  Laterality: Left;  [place] 53.7 AP% 19.5 CDE 10.45 Fluid pack lot # [id number]   CHOLECYSTECTOMY     COMBINED AUGMENTATION MAMMAPLASTY AND ABDOMINOPLASTY     JOINT REPLACEMENT Left 06/04/2013   left knee   KNEE ARTHROSCOPY Right 08/16/2016   Procedure: ARTHROSCOPY KNEE, tear posterior horn medial meniscus, tear anterior and posterior horns of lateral meniscus, chondromalacia of lateral compartment grade 3 patella and grade 4 medial;  Surgeon: Dereck Leep, MD;  Location: [facility];  Service: Orthopedics;  Laterality: Right;   MASTECTOMY  1986   nipple sparing mastectomy/Bilateral with silicone  breast implants, s/p saline  replacements   Multiple orthopedic procedures     NOSE SURGERY     TEE WITHOUT CARDIOVERSION N/A 09/26/2017   Procedure: TRANSESOPHAGEAL ECHOCARDIOGRAM (TEE);  Surgeon: Fay Records, MD;  Location: [facility];  Service: Cardiovascular;  Laterality: N/A;   TOTAL KNEE ARTHROPLASTY Left     Allergies  Allergies  Allergen Reactions   Iodine Anaphylaxis    NO PROBLEMS WITH BETADINE   Amiodarone    Biaxin [Clarithromycin] Nausea And Vomiting   Codeine Nausea And Vomiting   Digoxin  And Related Other (See Comments)    Fatigue, eye puffiness, hoarsness   Enalapril Other (See Comments)    unknown   Fluocinonide Other (See Comments)    Tingling sensation in head and redness to scalp.   Iodinated Diagnostic Agents Other (See Comments)    Tachycardia   Oxycodone Nausea And Vomiting   Promethazine Other (See Comments)    Unknown    Tizanidine Hcl Other (See Comments)    hypotension hypotension   Warfarin And Related    Xarelto [Rivaroxaban] Other (See Comments)    Unknown     History of Present Illness    83 year old female with above complex past medical history including now permanent atrial fibrillation along with prior history of atrial flutter status post multiple cardioversions in the past.  She also has a history of tachycardia induced cardiomyopathy though this has resolved.  She has undergone diagnostic catheterization in the past without evidence of significant coronary artery disease.  In 2018, she was referred to electrophysiology for consideration of atrial fibrillation ablation.  After further discussion, both she and Dr. Rayann Heman agreed to pursue rate control and her amiodarone was discontinued as she was not maintaining sinus rhythm anyway.  She was last seen via telemedicine clinic on June 18 by Dr. Fletcher Anon.  She was doing well at that time.  Since then however, she has been noticing increasing dyspnea on exertion associated with left-sided tightness in her chest and shoulder.  This typically happens when she walks to her mailbox or up the steps in her house.  She has been taking care of her husband who was recently hospitalized and in general, has just been taking on more household activities like doing yard work.  Symptoms just last a few minutes and resolve spontaneously.  She does not have any symptoms at rest.  She has noted some increased indigestion and belching which seems to get better with Mylanta.  This typically occurs during the night.  She  denies palpitations, PND, orthopnea, dizziness, syncope, edema, or early satiety.  Home Medications    Prior to Admission medications   Medication Sig Start Date End Date Taking? Authorizing Provider  acetaminophen (TYLENOL) 500 MG tablet Take 1,000 mg by mouth every 6 (six) hours as needed (pain).    Yes [provider]  aluminum-magnesium hydroxide 200-200 MG/5ML suspension Take 20 mLs by mouth every 6 (six) hours as needed for indigestion.    Yes [provider]  cholecalciferol (VITAMIN D) 1000 units tablet Take 1,000 Units by mouth daily.   Yes [provider]  Coenzyme Q10 (COQ10 PO) Take by mouth daily.   Yes [provider]  diltiazem (CARDIZEM CD) 180 MG 24 hr capsule TAKE 1 CAPSULE BY MOUTH EVERY EVENING 05/08/19  Yes Arida, Mertie Clause, MD  ELIQUIS 5 MG TABS tablet TAKE ONE TABLET BY MOUTH TWICE DAILY 05/08/19  Yes Wellington Hampshire, MD  LORazepam (ATIVAN) 0.5 MG tablet Take 1 tablet (0.5 mg  total) by mouth as needed for anxiety. 02/12/19 02/12/20 Yes Earleen Newport, MD  metoprolol tartrate (LOPRESSOR) 50 MG tablet TAKE ONE TABLET 3 TIMES DAILY 05/08/19  Yes Wellington Hampshire, MD  OVER THE COUNTER MEDICATION Saline nasal spray   Yes [provider]  pantoprazole (PROTONIX) 40 MG tablet TAKE ONE TABLET BY MOUTH EVERY DAY 06/04/19  Yes Wellington Hampshire, MD  PARoxetine (PAXIL) 10 MG tablet TAKE ONE TABLET BY MOUTH EVERY DAY 05/08/19  Yes [name], MD  pravastatin (PRAVACHOL) 20 MG tablet TAKE ONE TABLET BY MOUTH EVERY DAY 05/08/19  Yes [name], MD  Probiotic Product (PROBIOTIC ADVANCED PO) Take 1 tablet by mouth daily.   Yes [provider]  traZODone (DESYREL) 50 MG tablet Take 1 tablet (50 mg total) by mouth at bedtime. 02/12/19  Yes Earleen Newport, MD    Review of Systems    Increasing dyspnea on exertion over the past few weeks with some chest pressure.  She denies palpitations, PND, orthopnea, dizziness,  syncope, edema, or early satiety.  All other systems reviewed and are otherwise negative except as noted above.  Physical Exam    VS:  BP (!) 142/82 (BP Location: Left Arm, Patient Position: Sitting, Cuff Size: Normal)    Pulse 88    Ht 5\' 4"  (1.626 m)    Wt 164 lb (74.4 kg)    SpO2 97%    BMI 28.15 kg/m  , BMI Body mass index is 28.15 kg/m. GEN: Well nourished, well developed, in no acute distress. HEENT: normal. Neck: Supple, no JVD, carotid bruits, or masses. Cardiac: Irregularly irregular, no murmurs, rubs, or gallops. No clubbing, cyanosis, trace bilateral ankle edema.  Radials/DP/PT 1+ and equal bilaterally.  Respiratory:  Respirations regular and unlabored, clear to auscultation bilaterally. GI: Soft, nontender, nondistended, BS + x 4. MS: no deformity or atrophy. Skin: warm and dry, no rash. Neuro:  Strength and sensation are intact. Psych: Normal affect.  Accessory Clinical Findings    ECG personally reviewed by me today -atrial fibrillation, 88, right bundle branch block diffuse T wave inversion- no acute changes.  Lab Results  Component Value Date   WBC 6.1 02/12/2019   HGB 14.3 02/12/2019   HCT 44.8 02/12/2019   MCV 97.0 02/12/2019   PLT 263 02/12/2019   Lab Results  Component Value Date   CREATININE 0.91 02/15/2019   BUN 20 02/15/2019   NA 135 02/15/2019   K 4.7 02/15/2019   CL 100 02/15/2019   CO2 26 02/15/2019   Lab Results  Component Value Date   ALT 32 02/12/2019   AST 18 02/12/2019   ALKPHOS 80 02/12/2019   BILITOT 1.0 02/12/2019   Lab Results  Component Value Date   CHOL 147 07/22/2017   HDL 63 07/22/2017   LDLCALC 74 07/22/2017   TRIG 51 07/22/2017   CHOLHDL 2.3 07/22/2017     Assessment & Plan    1.  Exertional dyspnea and chest pressure: Patient presents with a 2 to 3-week history of intermittent exertional dyspnea and chest pressure.  She has noticed this as she has taken on a greater responsibility for yard work as her husband is  rehabilitating from her recent surgery.  She has not had any resting symptoms.  Her heart rate and blood pressure are elevated today at 88 and 142/82 respectively.  I wonder to what extent tachycardia and hypertension are playing a role in her symptoms.  I am going to titrate  her metoprolol to 100 mg twice daily.  I am also going to arrange for a Lexiscan Myoview to assess for ischemia.  2.  Permanent atrial fibrillation: Rate is elevated at 88.  Titrating beta-blocker to 100 mg twice daily.  Continue current dose of diltiazem-180 mg daily.  3.  Essential hypertension: Blood pressure elevated.  Titrating beta-blocker to 100 mg twice daily.  Continue diltiazem.  4.  Hyperlipidemia: On Pravachol therapy.  Her LDL was 74 in August 2018 and she is overdue for follow-up labs.  She plans to have these carried out to her primary care provider's office as she has an annual physical scheduled next week.  5.  Tachycardia induced cardiomyopathy: Increasing dyspnea on exertion and chest pressure as above.  She is relatively euvolemic on exam with only trace ankle edema.  Weight has been stable.  Most recent echo in 2019 showed an EF of 55 to 60%.  Adjusting beta-blocker therapy in the setting of mild elevation in heart rate and blood pressure.  If EF down on stress testing, will pursue repeat echo.  6.  Disposition: Follow-up stress testing.  Follow-up in clinic in 1 month or sooner if necessary.  Murray Hodgkins, NP 06/29/2019, 2:23 PM

## 2019-06-29 NOTE — Patient Instructions (Addendum)
Medication Instructions:  - Your physician has recommended you make the following change in your medication:   1) Increase metoprolol tartarte to 100 mg- take 1 tablet by mouth twice daily  If you need a refill on your cardiac medications before your next appointment, please call your pharmacy.   Lab work: - none ordered  If you have labs (blood work) drawn today and your tests are completely normal, you will receive your results only by: [name] MyChart Message (if you have MyChart) OR . A paper copy in the mail If you have any lab test that is abnormal or we need to change your treatment, we will call you to review the results.  Testing/Procedures: - Your physician has requested that you have a lexiscan myoview.   [place]  Your caregiver has ordered a Stress Test with nuclear imaging. The purpose of this test is to evaluate the blood supply to your heart muscle. This procedure is referred to as a "Non-Invasive Stress Test." This is because other than having an IV started in your vein, nothing is inserted or "invades" your body. Cardiac stress tests are done to find areas of poor blood flow to the heart by determining the extent of coronary artery disease (CAD). Some patients exercise on a treadmill, which naturally increases the blood flow to your heart, while others who are  unable to walk on a treadmill due to physical limitations have a pharmacologic/chemical stress agent called Lexiscan . This medicine will mimic walking on a treadmill by temporarily increasing your coronary blood flow.   Please note: these test may take anywhere between 2-4 hours to complete  PLEASE REPORT TO [facility] AT THE FIRST DESK WILL DIRECT YOU WHERE TO GO  Date of Procedure:_____________________________________  Arrival Time for Procedure:______________________________  Instructions regarding medication:     _x___:  Hold betablocker(s) night before procedure and  morning of procedure (metoprolol)   PLEASE NOTIFY THE OFFICE AT LEAST 24 HOURS IN ADVANCE IF YOU ARE UNABLE TO KEEP YOUR APPOINTMENT.  [phone] AND  PLEASE NOTIFY NUCLEAR MEDICINE AT [facility] AT LEAST 24 HOURS IN ADVANCE IF YOU ARE UNABLE TO KEEP YOUR APPOINTMENT. [phone]  How to prepare for your Myoview test:  1. Do not eat or drink after midnight 2. No caffeine for 24 hours prior to test 3. No smoking 24 hours prior to test. 4. Your medication may be taken with water.  If your doctor stopped a medication because of this test, do not take that medication. 5. Ladies, please do not wear dresses.  Skirts or pants are appropriate. Please wear a short sleeve shirt. 6. No perfume, cologne or lotion. 7. Wear comfortable walking shoes. No heels!   Follow-Up: At [facility] you and your health needs are our priority.  As part of our continuing mission to provide you with exceptional heart care, we have created designated Provider Care Teams.  These Care Teams include your primary Cardiologist (physician) and Advanced Practice Providers (APPs -  Physician Assistants and Nurse Practitioners) who all work together to provide you with the care you need, when you need it. . 1 month with Dr. Fletcher Anon  Any Other Special Instructions Will Be Listed Below (If Applicable). - N/A   Cardiac Nuclear Scan A cardiac nuclear scan is a test that measures blood flow to the heart when a person is resting and when he or she is exercising. The test looks for problems such as:  Not enough blood  reaching a portion of the heart.  The heart muscle not working normally. You may need this test if:  You have heart disease.  You have had abnormal lab results.  You have had heart surgery or a balloon procedure to open up blocked arteries (angioplasty).  You have chest pain.  You have shortness of breath. In this test, a radioactive dye (tracer) is injected into your bloodstream. After the tracer has  traveled to your heart, an imaging device is used to measure how much of the tracer is absorbed by or distributed to various areas of your heart. This procedure is usually done at a hospital and takes 2-4 hours. Tell a health care provider about:  Any allergies you have.  All medicines you are taking, including vitamins, herbs, eye drops, creams, and over-the-counter medicines.  Any problems you or family members have had with anesthetic medicines.  Any blood disorders you have.  Any surgeries you have had.  Any medical conditions you have.  Whether you are pregnant or may be pregnant. What are the risks? Generally, this is a safe procedure. However, problems may occur, including:  Serious chest pain and heart attack. This is only a risk if the stress portion of the test is done.  Rapid heartbeat.  Sensation of warmth in your chest. This usually passes quickly.  Allergic reaction to the tracer. What happens before the procedure?  Ask your health care provider about changing or stopping your regular medicines. This is especially important if you are taking diabetes medicines or blood thinners.  Follow instructions from your health care provider about eating or drinking restrictions.  Remove your jewelry on the day of the procedure. What happens during the procedure?  An IV will be inserted into one of your veins.  Your health care provider will inject a small amount of radioactive tracer through the IV.  You will wait for 20-40 minutes while the tracer travels through your bloodstream.  Your heart activity will be monitored with an electrocardiogram (ECG).  You will lie down on an exam table.  Images of your heart will be taken for about 15-20 minutes.  You may also have a stress test. For this test, one of the following may be done: ? You will exercise on a treadmill or stationary bike. While you exercise, your heart's activity will be monitored with an ECG, and your  blood pressure will be checked. ? You will be given medicines that will increase blood flow to parts of your heart. This is done if you are unable to exercise.  When blood flow to your heart has peaked, a tracer will again be injected through the IV.  After 20-40 minutes, you will get back on the exam table and have more images taken of your heart.  Depending on the type of tracer used, scans may need to be repeated 3-4 hours later.  Your IV line will be removed when the procedure is over. The procedure may vary among health care providers and hospitals. What happens after the procedure?  Unless your health care provider tells you otherwise, you may return to your normal schedule, including diet, activities, and medicines.  Unless your health care provider tells you otherwise, you may increase your fluid intake. This will help to flush the contrast dye from your body. Drink enough fluid to keep your urine pale yellow.  Ask your health care provider, or the department that is doing the test: ? When will my results be ready? ?  How will I get my results? Summary  A cardiac nuclear scan measures the blood flow to the heart when a person is resting and when he or she is exercising.  Tell your health care provider if you are pregnant.  Before the procedure, ask your health care provider about changing or stopping your regular medicines. This is especially important if you are taking diabetes medicines or blood thinners.  After the procedure, unless your health care provider tells you otherwise, increase your fluid intake. This will help flush the contrast dye from your body.  After the procedure, unless your health care provider tells you otherwise, you may return to your normal schedule, including diet, activities, and medicines. This information is not intended to replace advice given to you by your health care provider. Make sure you discuss any questions you have with your health care  provider. Document Released: 12/31/2004 Document Revised: 05/22/2018 Document Reviewed: 05/22/2018 Elsevier Patient Education  2020 [employer].

## 2019-07-02 ENCOUNTER — Ambulatory Visit: Payer: Medicare Other

## 2019-07-02 ENCOUNTER — Other Ambulatory Visit: Payer: Self-pay

## 2019-07-11 ENCOUNTER — Other Ambulatory Visit: Payer: Self-pay

## 2019-07-11 ENCOUNTER — Telehealth: Payer: Self-pay | Admitting: Cardiovascular Disease

## 2019-07-11 ENCOUNTER — Encounter
Admission: RE | Admit: 2019-07-11 | Discharge: 2019-07-11 | Disposition: A | Discharge status: Home / Self Care | Payer: Medicare Other | Source: Ambulatory Visit | Attending: Nurse Practitioner | Admitting: Nurse Practitioner

## 2019-07-11 DIAGNOSIS — R079 Chest pain, unspecified: Secondary | ICD-10-CM | POA: Diagnosis not present

## 2019-07-11 LAB — NM MYOCAR MULTI W/SPECT W/WALL MOTION / EF
Estimated workload: 1 METS
Exercise duration (min): 0 min
Exercise duration (sec): 0 s
LV dias vol: 58 mL (ref 46–106)
LV sys vol: 24 mL
MPHR: 139 {beats}/min
Peak HR: 110 {beats}/min
Percent HR: 79 %
Rest HR: 95 {beats}/min
TID: 0.7

## 2019-07-11 MED ORDER — TECHNETIUM TC 99M TETROFOSMIN IV KIT
30.0000 | PACK | Freq: Once | INTRAVENOUS | Status: AC | PRN
  Administered 2019-07-11: 31.535 via INTRAVENOUS

## 2019-07-11 MED ORDER — REGADENOSON 0.4 MG/5ML IV SOLN
0.4000 mg | Freq: Once | INTRAVENOUS | Status: AC
  Administered 2019-07-11: 0.4 mg via INTRAVENOUS

## 2019-07-11 MED ORDER — TECHNETIUM TC 99M TETROFOSMIN IV KIT
10.8000 | PACK | Freq: Once | INTRAVENOUS | Status: AC | PRN
  Administered 2019-07-11: 10.8 via INTRAVENOUS

## 2019-07-11 NOTE — Telephone Encounter (Signed)
Pt c/o medication issue:  1. Name of Medication: Eliquis   2. How are you currently taking this medication (dosage and times per day)? 5 mg po BID   3. Are you having a reaction (difficulty breathing--STAT)? No   4. What is your medication issue? Patient in [place] and needs assistance / samples / or med change this med is not currently affordable

## 2019-07-12 NOTE — Telephone Encounter (Signed)
Patient calling the office for samples of medication:   1.  What medication and dosage are you requesting samples for? Eliquis   2.  Are you currently out of this medication?  Yes see note below patient spouse will be here this morning and wants to get samples for her while here

## 2019-07-12 NOTE — Telephone Encounter (Signed)
Spoke with patient. She says she went to pick up Rx for Eliquis and it would cost her $430 for 1 month suppldy. States she cannot afford this. She has not done patient assistance before. Her husband is coming today for an appointment and is going to ask provider about getting meds from [place]. Advised I will give her husband the patient assistance application for her, and advised her to call Eliquis as well. Samples widlldl be given to husband too.  Drug name: Eliquis       Strength: 5mg         Qty: 3 boxes  LOT: [id number]  Exp.Date: 08/2021

## 2019-07-12 NOTE — Telephone Encounter (Signed)
Patient assistance forms placed with patient samples.  Physician portion for Dr Fletcher Anon to complete given to Lattie Haw, RN.

## 2019-07-17 ENCOUNTER — Telehealth: Payer: Self-pay | Admitting: Cardiovascular Disease

## 2019-07-17 DIAGNOSIS — L820 Inflamed seborrheic keratosis: Secondary | ICD-10-CM | POA: Diagnosis not present

## 2019-07-17 DIAGNOSIS — D1800 Hemangioma unspecified site: Secondary | ICD-10-CM | POA: Diagnosis not present

## 2019-07-17 DIAGNOSIS — L821 Other seborrheic keratosis: Secondary | ICD-10-CM | POA: Diagnosis not present

## 2019-07-17 DIAGNOSIS — L578 Other skin changes due to chronic exposure to nonionizing radiation: Secondary | ICD-10-CM | POA: Diagnosis not present

## 2019-07-17 DIAGNOSIS — L570 Actinic keratosis: Secondary | ICD-10-CM | POA: Diagnosis not present

## 2019-07-17 NOTE — Telephone Encounter (Signed)

## 2019-07-18 ENCOUNTER — Other Ambulatory Visit: Payer: Self-pay

## 2019-07-18 ENCOUNTER — Encounter: Payer: Self-pay | Admitting: Nurse Practitioner

## 2019-07-18 ENCOUNTER — Ambulatory Visit: Payer: Medicare Other | Admitting: Nurse Practitioner

## 2019-07-18 VITALS — BP 130/80 | HR 87 | Temp 97.6°F | Ht 64.5 in | Wt 166.5 lb

## 2019-07-18 DIAGNOSIS — I10 Essential (primary) hypertension: Secondary | ICD-10-CM

## 2019-07-18 DIAGNOSIS — I200 Unstable angina: Secondary | ICD-10-CM

## 2019-07-18 DIAGNOSIS — I4821 Permanent atrial fibrillation: Secondary | ICD-10-CM | POA: Diagnosis not present

## 2019-07-18 DIAGNOSIS — E782 Mixed hyperlipidemia: Secondary | ICD-10-CM

## 2019-07-18 NOTE — H&P (View-Only) (Signed)
Cardiology Clinic Note   Patient Name: EMMERSEN GARRAWAY Date of Encounter: 07/18/2019  Primary Care Provider:  [name], MD Primary Cardiologist:  [name], MD  Patient Profile    83 year old female with a history of permanent atrial fibrillation and flutter, hypertension, hyperlipidemia, remote tachycardia induced cardiomyopathy, and worsening dyspnea on exertion and chest pressure with recent abnormal stress test, who presents for follow-up of the latter.  Past Medical History    Past Medical History:  Diagnosis Date  . Arthritis   . Atrial flutter ([place]) 02/2011   s/p cardioversion   . Chest pain    a. H/o cardiac cath x 2, last 2012 -->nl cors;  b. 12/2016 MV: EF 61%, small region of mild perfusion defect in the apical anteroseptal region c/w breast attenuation, no ischemia-->Low risk; c. 06/2019 MV: Mod size, mild inflat ischemia, EF 59%. Cor and Ao Ca2+. Inflat defect more pronounced on this study compared to last.  . CKD (chronic kidney disease), stage III ([place])   . Cystocele   . GERD (gastroesophageal reflux disease)   . Headache(784.0)    chronic  . Hyperlipidemia   . Hypertension   . Knee fracture   . Mobitz type 2 second degree atrioventricular block    a. felt to be 2/2 amiodarone, resolved with decreased amiodarone dose.  Amio since d/c'd.  . Permanent atrial fibrillation    a. status post multiple DCCVs; b. 2018 - eval for PVI but opted for rate control.  [name] PONV (postoperative nausea and vomiting)    oxycodone and codiene cause N/V   . Pre-syncope    a. In setting of dehydration and AKI in the past.  . Tachycardia induced cardiomyopathy ([place])    a. Resolved;  b. 08/2017 Echo: EF 50-55%, no rwma, mild MR, mildly to mod dil LA/RA; c. 02/2018 Echo: EF 55-60%, mild MR. Mildly dil LA. Nl RVSP. PASP 40mmHg.  [name] Venous insufficiency   . Vertigo    Past Surgical History:  Procedure Laterality Date  . ABDOMINAL HYSTERECTOMY  1990  . APPENDECTOMY    .  AUGMENTATION MAMMAPLASTY Bilateral 1986   implants  . AUGMENTATION MAMMAPLASTY  1990  . AUGMENTATION MAMMAPLASTY  2011  . CARDIAC CATHETERIZATION    . CARDIOVERSION     x 3  . CARDIOVERSION    . CARDIOVERSION N/A 02/07/2017   Procedure: CARDIOVERSION;  Surgeon: Wellington Hampshire, MD;  Location: [facility] ORS;  Service: Cardiovascular;  Laterality: N/A;  . CARDIOVERSION N/A 07/22/2017   Procedure: Cardioversion;  Surgeon: Minna Merritts, MD;  Location: [facility];  Service: Cardiovascular;  Laterality: N/A;  . CATARACT EXTRACTION W/PHACO Right 09/21/2016   Procedure: CATARACT EXTRACTION PHACO AND INTRAOCULAR LENS PLACEMENT ([place]);  Surgeon: Birder Robson, MD;  Location: [facility];  Service: Ophthalmology;  Laterality: Right;  [place] 44.1 AP% 16.5 CDE 7.30 Fluid Pack Lot #[id number]  . CATARACT EXTRACTION W/PHACO Left 10/19/2016   Procedure: CATARACT EXTRACTION PHACO AND INTRAOCULAR LENS PLACEMENT (I[place]);  Surgeon: Birder Robson, MD;  Location: [facility];  Service: Ophthalmology;  Laterality: Left;  [place] 53.7 AP% 19.5 CDE 10.45 Fluid pack lot # [id number]  . CHOLECYSTECTOMY    . COMBINED AUGMENTATION MAMMAPLASTY AND ABDOMINOPLASTY    . JOINT REPLACEMENT Left 06/04/2013   left knee  . KNEE ARTHROSCOPY Right 08/16/2016   Procedure: ARTHROSCOPY KNEE, tear posterior horn medial meniscus, tear anterior and posterior horns of lateral meniscus, chondromalacia of lateral compartment grade 3 patella and grade 4 medial;  Surgeon:  Dereck Leep, MD;  Location: [facility];  Service: Orthopedics;  Laterality: Right;  . MASTECTOMY  1986   nipple sparing mastectomy/Bilateral with silicone  breast implants, s/p saline replacements  . Multiple orthopedic procedures    . NOSE SURGERY    . TEE WITHOUT CARDIOVERSION N/A 09/26/2017   Procedure: TRANSESOPHAGEAL ECHOCARDIOGRAM (TEE);  Surgeon: Fay Records, MD;  Location: [facility] ENDOSCOPY;  Service: Cardiovascular;  Laterality: N/A;  . TOTAL KNEE ARTHROPLASTY Left      Allergies  Allergies  Allergen Reactions  . Iodine Anaphylaxis    NO PROBLEMS WITH BETADINE  . Amiodarone   . Biaxin [Clarithromycin] Nausea And Vomiting  . Codeine Nausea And Vomiting  . Digoxin And Related Other (See Comments)    Fatigue, eye puffiness, hoarsness  . Enalapril Other (See Comments)    unknown  . Fluocinonide Other (See Comments)    Tingling sensation in head and redness to scalp.  . Iodinated Diagnostic Agents Other (See Comments)    Tachycardia  . Oxycodone Nausea And Vomiting  . Promethazine Other (See Comments)    Unknown   . Tizanidine Hcl Other (See Comments)    hypotension hypotension  . Warfarin And Related   . Xarelto [Rivaroxaban] Other (See Comments)    Unknown     History of Present Illness    83 year old female with the above complex past medical history including now permanent atrial fibrillation along with prior history of atrial flutter status post multiple cardioversions in the past.  She also has a history of tachycardia induced cardiomyopathy though this has resolved.  She has undergone diagnostic catheterization in the past without evidence of significant coronary artery disease-last 2012.  In 2018, she was referred to electrophysiology for consideration of atrial fibrillation ablation.  After further discussion, both she and Dr. Rayann Heman agreed to pursue rate control and her amiodarone was discontinued as she was not maintaining sinus rhythm anyway.  She was doing well when seen via telemedicine visit on June 18 by Dr. Fletcher Anon however, I saw her on July 10 due to increasing dyspnea on exertion associated with left-sided tightness in her chest and shoulder.  Her HR was mildly elevated @ 88 and I asked her to increase her metoprolol to 100 bid.  She did that but says her hr got too low for her comfort (still > 60 by her report) and so she reduced metoprolol back to 50 TID.  I also obtained a [employer] which showed a moderate in size, mild  inferolateral area of reversibility/ischemia with normal LV function.  This was determined to be an intermediate risk study.  Today, she reports that she has continued to have dyspnea with somewhat minimal exertion such as walking up stairs in her home or out to her mailbox.  This continues to be associated with mild chest tightness.  She is interested in pursuing diagnostic catheterization.  She denies palpitations, PND, orthopnea, dizziness, syncope, edema, or early satiety.  Home Medications    Prior to Admission medications   Medication Sig Start Date End Date Taking? Authorizing Provider  acetaminophen (TYLENOL) 500 MG tablet Take 1,000 mg by mouth every 6 (six) hours as needed (pain).    Yes [provider]  aluminum-magnesium hydroxide 200-200 MG/5ML suspension Take 20 mLs by mouth every 6 (six) hours as needed for indigestion.    Yes [provider]  cholecalciferol (VITAMIN D) 1000 units tablet Take 1,000 Units by mouth daily.   Yes [provider]  Coenzyme  Q10 (COQ10 PO) Take by mouth daily.   Yes [provider]  diltiazem (CARDIZEM CD) 180 MG 24 hr capsule TAKE 1 CAPSULE BY MOUTH EVERY EVENING 05/08/19  Yes Wellington Hampshire, MD  ELIQUIS 5 MG TABS tablet TAKE ONE TABLET BY MOUTH TWICE DAILY 05/08/19  Yes Wellington Hampshire, MD  metoprolol tartrate (LOPRESSOR) 100 MG tablet Take 1 tablet (100 mg total) by mouth 2 (two) times daily. Patient taking differently: Take 50 mg by mouth 3 (three) times daily.  06/29/19 09/27/19 Yes Theora Gianotti, NP  OVER THE COUNTER MEDICATION Saline nasal spray   Yes [provider]  pantoprazole (PROTONIX) 40 MG tablet TAKE ONE TABLET BY MOUTH EVERY DAY 06/04/19  Yes Wellington Hampshire, MD  pravastatin (PRAVACHOL) 20 MG tablet TAKE ONE TABLET BY MOUTH EVERY DAY 05/08/19  Yes [name], MD  Probiotic Product (PROBIOTIC ADVANCED PO) Take 1 tablet by mouth daily.   Yes [provider]  traZODone  (DESYREL) 50 MG tablet Take 1 tablet (50 mg total) by mouth at bedtime. 02/12/19  Yes Earleen Newport, MD    Family History    Family History  Problem Relation Age of Onset  . Heart disease Mother   . Cancer Father        stomach  . Heart disease Son        found at autopsy  . Cancer Sister        breast  . Breast cancer Sister   . Cancer Maternal Grandmother        breast  . Breast cancer Maternal Grandmother   . Cancer Sister        breast  . Breast cancer Sister   . Breast cancer Sister   . Cancer Sister        breast  . Diabetes Brother   . Ovarian cancer Neg Hx    She indicated that her mother is deceased. She indicated that her father is deceased. She indicated that two of her three sisters are alive. She indicated that the status of her brother is unknown. She indicated that her maternal grandmother is deceased. She indicated that her maternal grandfather is deceased. She indicated that her paternal grandmother is deceased. She indicated that her paternal grandfather is deceased. She indicated that her son is deceased. She indicated that the status of her neg hx is unknown.  Social History    Social History   Socioeconomic History  . Marital status: Married    Spouse name: Not on file  . Number of children: 1  . Years of education: Not on file  . Highest education level: Not on file  Occupational History    Employer: RETIRED  Social Needs  . Financial resource strain: Not on file  . Food insecurity    Worry: Not on file    Inability: Not on file  . Transportation needs    Medical: Not on file    Non-medical: Not on file  Tobacco Use  . Smoking status: Never Smoker  . Smokeless tobacco: Never Used  Substance and Sexual Activity  . Alcohol use: No  . Drug use: No  . Sexual activity: Never    Birth control/protection: Surgical  Lifestyle  . Physical activity    Days per week: Not on file    Minutes per session: Not on file  . Stress: Not on file   Relationships  . Social connections    Talks on phone: Not on  file    Gets together: Not on file    Attends religious service: Not on file    Active member of club or organization: Not on file    Attends meetings of clubs or organizations: Not on file    Relationship status: Not on file  . Intimate partner violence    Fear of current or ex partner: Not on file    Emotionally abused: Not on file    Physically abused: Not on file    Forced sexual activity: Not on file  Other Topics Concern  . Not on file  Social History Narrative   Married      Review of Systems    General:  No chills, fever, night sweats or weight changes.  Cardiovascular:  +++ exertional chest presuure, +++ dyspnea on exertion, no edema, orthopnea, palpitations, paroxysmal nocturnal dyspnea. Dermatological: No rash, lesions/masses Respiratory: No cough, +++ dyspnea Urologic: No hematuria, dysuria Abdominal:   No nausea, vomiting, diarrhea, bright red blood per rectum, melena, or hematemesis Neurologic:  No visual changes, wkns, changes in mental status. All other systems reviewed and are otherwise negative except as noted above.  Physical Exam    VS:  BP 130/80 (BP Location: Left Arm, Patient Position: Sitting, Cuff Size: Normal)   Pulse 87   Temp 97.6 F (36.4 C)   Ht 5' 4.5" (1.638 m)   Wt 166 lb 8 oz (75.5 kg)   BMI 28.14 kg/m  , BMI Body mass index is 28.14 kg/m. GEN: Well nourished, well developed, in no acute distress. HEENT: normal. Neck: Supple, no JVD, carotid bruits, or masses. Cardiac: IR, IR, no murmurs, rubs, or gallops. No clubbing, cyanosis, edema.  Radials/PT 2+ and equal bilaterally.  Nl Allen's test. Respiratory:  Respirations regular and unlabored, clear to auscultation bilaterally. GI: Soft, nontender, nondistended, BS + x 4. MS: no deformity or atrophy. Skin: warm and dry, no rash. Neuro:  Strength and sensation are intact. Psych: Normal affect.  Accessory Clinical Findings     ECG personally reviewed by me today- afib, 87, rbbb w diff twi - No acute changes  Lab Results  Component Value Date   WBC 6.1 02/12/2019   HGB 14.3 02/12/2019   HCT 44.8 02/12/2019   MCV 97.0 02/12/2019   PLT 263 02/12/2019   Lab Results  Component Value Date   CREATININE 0.91 02/15/2019   BUN 20 02/15/2019   NA 135 02/15/2019   K 4.7 02/15/2019   CL 100 02/15/2019   CO2 26 02/15/2019   Lab Results  Component Value Date   ALT 32 02/12/2019   AST 18 02/12/2019   ALKPHOS 80 02/12/2019   BILITOT 1.0 02/12/2019   Lab Results  Component Value Date   CHOL 147 07/22/2017   HDL 63 07/22/2017   LDLCALC 74 07/22/2017   TRIG 51 07/22/2017   CHOLHDL 2.3 07/22/2017     Assessment & Plan   1.  [place]/Exertional Dyspnea: Prior history of normal coronary arteries by catheterization in 2012.  Increasing DOE and assoc chest tightness over the past month.  Recent myoview w/ mild inflat reversibility/ischemia.  We discussed options for further evaluation and management today, including cardiac catheterization.  Her husband was present for this discussion.  The patient understands that risks include but are not limited to stroke (1 in 1000), death (1 in 38), kidney failure [usually temporary] (1 in 500), bleeding (1 in 200), allergic reaction [possibly serious] (1 in 200), and agrees to proceed.  Continue beta-blocker  and statin therapy.  She will hold Eliquis for 2 days prior to catheterization with COVID testing 3 to 4 days prior to catheterization.  2.  Permanent atrial fibrillation: Had previously increase metoprolol to 100 mg twice daily however, she felt like this was causing her heart rate to be too low though notes that it was always at least greater than 60.  Regardless, she reduce her metoprolol back to 50 mg 3 times daily.  Continue that dose.  She is anticoagulated with Eliquis and is going to look into seeing how she can get this from [place] as she is currently in the donut hole.   She is aware that she will need to hold Eliquis for 2 days prior to catheterization.  3.  Essential hypertension: Stable.  Continue beta-blocker and diltiazem.  4.  Hyperlipidemia: LDL 74 on Pravachol therapy.  May to need to increase potency based on catheterization findings.  5.  History of tachycardia induced cardiomyopathy: EF 55 to 60% by echo in 2019.  EF 59% by stress testing.  Euvolemic on exam.  6.  Disposition: Follow-up lab work in preparation for catheterization including COVID testing approximately 3 to 4 days prior to cath.  Follow-up in clinic 2 weeks post cath.  Murray Hodgkins, NP 07/18/2019, 1:13 PM

## 2019-07-18 NOTE — Progress Notes (Signed)
Cardiology Clinic Note   Patient Name: [name] [name] Date of Encounter: 07/18/2019  Primary Care Provider:  [name], MD Primary Cardiologist:  [name], MD  Patient Profile    83 year old female with a history of permanent atrial fibrillation and flutter, hypertension, hyperlipidemia, remote tachycardia induced cardiomyopathy, and worsening dyspnea on exertion and chest pressure with recent abnormal stress test, who presents for follow-up of the latter.  Past Medical History    Past Medical History:  Diagnosis Date  . Arthritis   . Atrial flutter ([place]) 02/2011   s/p cardioversion   . Chest pain    a. H/o cardiac cath x 2, last 2012 -->nl cors;  b. 12/2016 MV: EF 61%, small region of mild perfusion defect in the apical anteroseptal region c/w breast attenuation, no ischemia-->Low risk; c. 06/2019 MV: Mod size, mild inflat ischemia, EF 59%. Cor and Ao Ca2+. Inflat defect more pronounced on this study compared to last.  . CKD (chronic kidney disease), stage III ([place])   . Cystocele   . GERD (gastroesophageal reflux disease)   . Headache(784.0)    chronic  . Hyperlipidemia   . Hypertension   . Knee fracture   . Mobitz type 2 second degree atrioventricular block    a. felt to be 2/2 amiodarone, resolved with decreased amiodarone dose.  Amio since d/c'd.  . Permanent atrial fibrillation    a. status post multiple DCCVs; b. 2018 - eval for PVI but opted for rate control.  [name] PONV (postoperative nausea and vomiting)    oxycodone and codiene cause N/V   . Pre-syncope    a. In setting of dehydration and AKI in the past.  . Tachycardia induced cardiomyopathy ([place])    a. Resolved;  b. 08/2017 Echo: EF 50-55%, no rwma, mild MR, mildly to mod dil LA/RA; c. 02/2018 Echo: EF 55-60%, mild MR. Mildly dil LA. Nl RVSP. PASP 55mmHg.  [name] Venous insufficiency   . Vertigo    Past Surgical History:  Procedure Laterality Date  . ABDOMINAL HYSTERECTOMY  1990  . APPENDECTOMY    .  AUGMENTATION MAMMAPLASTY Bilateral 1986   implants  . AUGMENTATION MAMMAPLASTY  1990  . AUGMENTATION MAMMAPLASTY  2011  . CARDIAC CATHETERIZATION    . CARDIOVERSION     x 3  . CARDIOVERSION    . CARDIOVERSION N/A 02/07/2017   Procedure: CARDIOVERSION;  Surgeon: Wellington Hampshire, MD;  Location: [facility] ORS;  Service: Cardiovascular;  Laterality: N/A;  . CARDIOVERSION N/A 07/22/2017   Procedure: Cardioversion;  Surgeon: Minna Merritts, MD;  Location: [facility] ORS;  Service: Cardiovascular;  Laterality: N/A;  . CATARACT EXTRACTION W/PHACO Right 09/21/2016   Procedure: CATARACT EXTRACTION PHACO AND INTRAOCULAR LENS PLACEMENT ([place]);  Surgeon: Birder Robson, MD;  Location: [facility] ORS;  Service: Ophthalmology;  Laterality: Right;  [place] 44.1 AP% 16.5 CDE 7.30 Fluid Pack Lot #[id number]  . CATARACT EXTRACTION W/PHACO Left 10/19/2016   Procedure: CATARACT EXTRACTION PHACO AND INTRAOCULAR LENS PLACEMENT (IOC);  Surgeon: Birder Robson, MD;  Location: [facility] ORS;  Service: Ophthalmology;  Laterality: Left;  [place] 53.7 AP% 19.5 CDE 10.45 Fluid pack lot # [id number]  . CHOLECYSTECTOMY    . COMBINED AUGMENTATION MAMMAPLASTY AND ABDOMINOPLASTY    . JOINT REPLACEMENT Left 06/04/2013   left knee  . KNEE ARTHROSCOPY Right 08/16/2016   Procedure: ARTHROSCOPY KNEE, tear posterior horn medial meniscus, tear anterior and posterior horns of lateral meniscus, chondromalacia of lateral compartment grade 3 patella and grade 4 medial;  Surgeon:  Dereck Leep, MD;  Location: [facility] ORS;  Service: Orthopedics;  Laterality: Right;  . MASTECTOMY  1986   nipple sparing mastectomy/Bilateral with silicone  breast implants, s/p saline replacements  . Multiple orthopedic procedures    . NOSE SURGERY    . TEE WITHOUT CARDIOVERSION N/A 09/26/2017   Procedure: TRANSESOPHAGEAL ECHOCARDIOGRAM (TEE);  Surgeon: Fay Records, MD;  Location: [facility] ENDOSCOPY;  Service: Cardiovascular;  Laterality: N/A;  . TOTAL KNEE ARTHROPLASTY Left      Allergies  Allergies  Allergen Reactions  . Iodine Anaphylaxis    NO PROBLEMS WITH BETADINE  . Amiodarone   . Biaxin [Clarithromycin] Nausea And Vomiting  . Codeine Nausea And Vomiting  . Digoxin And Related Other (See Comments)    Fatigue, eye puffiness, hoarsness  . Enalapril Other (See Comments)    unknown  . Fluocinonide Other (See Comments)    Tingling sensation in head and redness to scalp.  . Iodinated Diagnostic Agents Other (See Comments)    Tachycardia  . Oxycodone Nausea And Vomiting  . Promethazine Other (See Comments)    Unknown   . Tizanidine Hcl Other (See Comments)    hypotension hypotension  . Warfarin And Related   . Xarelto [Rivaroxaban] Other (See Comments)    Unknown     History of Present Illness    83 year old female with the above complex past medical history including now permanent atrial fibrillation along with prior history of atrial flutter status post multiple cardioversions in the past.  She also has a history of tachycardia induced cardiomyopathy though this has resolved.  She has undergone diagnostic catheterization in the past without evidence of significant coronary artery disease-last 2012.  In 2018, she was referred to electrophysiology for consideration of atrial fibrillation ablation.  After further discussion, both she and Dr. Rayann Heman agreed to pursue rate control and her amiodarone was discontinued as she was not maintaining sinus rhythm anyway.  She was doing well when seen via telemedicine visit on June 18 by Dr. Fletcher Anon however, I saw her on July 10 due to increasing dyspnea on exertion associated with left-sided tightness in her chest and shoulder.  Her HR was mildly elevated @ 88 and I asked her to increase her metoprolol to 100 bid.  She did that but says her hr got too low for her comfort (still > 60 by her report) and so she reduced metoprolol back to 50 TID.  I also obtained a [employer] which showed a moderate in size, mild  inferolateral area of reversibility/ischemia with normal LV function.  This was determined to be an intermediate risk study.  Today, she reports that she has continued to have dyspnea with somewhat minimal exertion such as walking up stairs in her home or out to her mailbox.  This continues to be associated with mild chest tightness.  She is interested in pursuing diagnostic catheterization.  She denies palpitations, PND, orthopnea, dizziness, syncope, edema, or early satiety.  Home Medications    Prior to Admission medications   Medication Sig Start Date End Date Taking? Authorizing Provider  acetaminophen (TYLENOL) 500 MG tablet Take 1,000 mg by mouth every 6 (six) hours as needed (pain).    Yes [provider]  aluminum-magnesium hydroxide 200-200 MG/5ML suspension Take 20 mLs by mouth every 6 (six) hours as needed for indigestion.    Yes [provider]  cholecalciferol (VITAMIN D) 1000 units tablet Take 1,000 Units by mouth daily.   Yes [provider]  Coenzyme  Q10 (COQ10 PO) Take by mouth daily.   Yes [provider]  diltiazem (CARDIZEM CD) 180 MG 24 hr capsule TAKE 1 CAPSULE BY MOUTH EVERY EVENING 05/08/19  Yes Wellington Hampshire, MD  ELIQUIS 5 MG TABS tablet TAKE ONE TABLET BY MOUTH TWICE DAILY 05/08/19  Yes Wellington Hampshire, MD  metoprolol tartrate (LOPRESSOR) 100 MG tablet Take 1 tablet (100 mg total) by mouth 2 (two) times daily. Patient taking differently: Take 50 mg by mouth 3 (three) times daily.  06/29/19 09/27/19 Yes Theora Gianotti, NP  OVER THE COUNTER MEDICATION Saline nasal spray   Yes [provider]  pantoprazole (PROTONIX) 40 MG tablet TAKE ONE TABLET BY MOUTH EVERY DAY 06/04/19  Yes Wellington Hampshire, MD  pravastatin (PRAVACHOL) 20 MG tablet TAKE ONE TABLET BY MOUTH EVERY DAY 05/08/19  Yes [name], MD  Probiotic Product (PROBIOTIC ADVANCED PO) Take 1 tablet by mouth daily.   Yes [provider]  traZODone  (DESYREL) 50 MG tablet Take 1 tablet (50 mg total) by mouth at bedtime. 02/12/19  Yes Earleen Newport, MD    Family History    Family History  Problem Relation Age of Onset  . Heart disease Mother   . Cancer Father        stomach  . Heart disease Son        found at autopsy  . Cancer Sister        breast  . Breast cancer Sister   . Cancer Maternal Grandmother        breast  . Breast cancer Maternal Grandmother   . Cancer Sister        breast  . Breast cancer Sister   . Breast cancer Sister   . Cancer Sister        breast  . Diabetes Brother   . Ovarian cancer Neg Hx    She indicated that her mother is deceased. She indicated that her father is deceased. She indicated that two of her three sisters are alive. She indicated that the status of her brother is unknown. She indicated that her maternal grandmother is deceased. She indicated that her maternal grandfather is deceased. She indicated that her paternal grandmother is deceased. She indicated that her paternal grandfather is deceased. She indicated that her son is deceased. She indicated that the status of her neg hx is unknown.  Social History    Social History   Socioeconomic History  . Marital status: Married    Spouse name: Not on file  . Number of children: 1  . Years of education: Not on file  . Highest education level: Not on file  Occupational History    Employer: RETIRED  Social Needs  . Financial resource strain: Not on file  . Food insecurity    Worry: Not on file    Inability: Not on file  . Transportation needs    Medical: Not on file    Non-medical: Not on file  Tobacco Use  . Smoking status: Never Smoker  . Smokeless tobacco: Never Used  Substance and Sexual Activity  . Alcohol use: No  . Drug use: No  . Sexual activity: Never    Birth control/protection: Surgical  Lifestyle  . Physical activity    Days per week: Not on file    Minutes per session: Not on file  . Stress: Not on file   Relationships  . Social connections    Talks on phone: Not on  file    Gets together: Not on file    Attends religious service: Not on file    Active member of club or organization: Not on file    Attends meetings of clubs or organizations: Not on file    Relationship status: Not on file  . Intimate partner violence    Fear of current or ex partner: Not on file    Emotionally abused: Not on file    Physically abused: Not on file    Forced sexual activity: Not on file  Other Topics Concern  . Not on file  Social History Narrative   Married      Review of Systems    General:  No chills, fever, night sweats or weight changes.  Cardiovascular:  +++ exertional chest presuure, +++ dyspnea on exertion, no edema, orthopnea, palpitations, paroxysmal nocturnal dyspnea. Dermatological: No rash, lesions/masses Respiratory: No cough, +++ dyspnea Urologic: No hematuria, dysuria Abdominal:   No nausea, vomiting, diarrhea, bright red blood per rectum, melena, or hematemesis Neurologic:  No visual changes, wkns, changes in mental status. All other systems reviewed and are otherwise negative except as noted above.  Physical Exam    VS:  BP 130/80 (BP Location: Left Arm, Patient Position: Sitting, Cuff Size: Normal)   Pulse 87   Temp 97.6 F (36.4 C)   Ht 5' 4.5" (1.638 m)   Wt 166 lb 8 oz (75.5 kg)   BMI 28.14 kg/m  , BMI Body mass index is 28.14 kg/m. GEN: Well nourished, well developed, in no acute distress. HEENT: normal. Neck: Supple, no JVD, carotid bruits, or masses. Cardiac: IR, IR, no murmurs, rubs, or gallops. No clubbing, cyanosis, edema.  Radials/PT 2+ and equal bilaterally.  Nl Allen's test. Respiratory:  Respirations regular and unlabored, clear to auscultation bilaterally. GI: Soft, nontender, nondistended, BS + x 4. MS: no deformity or atrophy. Skin: warm and dry, no rash. Neuro:  Strength and sensation are intact. Psych: Normal affect.  Accessory Clinical Findings     ECG personally reviewed by me today- afib, 87, rbbb w diff twi - No acute changes  Lab Results  Component Value Date   WBC 6.1 02/12/2019   HGB 14.3 02/12/2019   HCT 44.8 02/12/2019   MCV 97.0 02/12/2019   PLT 263 02/12/2019   Lab Results  Component Value Date   CREATININE 0.91 02/15/2019   BUN 20 02/15/2019   NA 135 02/15/2019   K 4.7 02/15/2019   CL 100 02/15/2019   CO2 26 02/15/2019   Lab Results  Component Value Date   ALT 32 02/12/2019   AST 18 02/12/2019   ALKPHOS 80 02/12/2019   BILITOT 1.0 02/12/2019   Lab Results  Component Value Date   CHOL 147 07/22/2017   HDL 63 07/22/2017   LDLCALC 74 07/22/2017   TRIG 51 07/22/2017   CHOLHDL 2.3 07/22/2017     Assessment & Plan   1.  [place]/Exertional Dyspnea: Prior history of normal coronary arteries by catheterization in 2012.  Increasing DOE and assoc chest tightness over the past month.  Recent myoview w/ mild inflat reversibility/ischemia.  We discussed options for further evaluation and management today, including cardiac catheterization.  Her husband was present for this discussion.  The patient understands that risks include but are not limited to stroke (1 in 1000), death (1 in 29), kidney failure [usually temporary] (1 in 500), bleeding (1 in 200), allergic reaction [possibly serious] (1 in 200), and agrees to proceed.  Continue beta-blocker  and statin therapy.  She will hold Eliquis for 2 days prior to catheterization with COVID testing 3 to 4 days prior to catheterization.  2.  Permanent atrial fibrillation: Had previously increase metoprolol to 100 mg twice daily however, she felt like this was causing her heart rate to be too low though notes that it was always at least greater than 60.  Regardless, she reduce her metoprolol back to 50 mg 3 times daily.  Continue that dose.  She is anticoagulated with Eliquis and is going to look into seeing how she can get this from [place] as she is currently in the donut hole.   She is aware that she will need to hold Eliquis for 2 days prior to catheterization.  3.  Essential hypertension: Stable.  Continue beta-blocker and diltiazem.  4.  Hyperlipidemia: LDL 74 on Pravachol therapy.  May to need to increase potency based on catheterization findings.  5.  History of tachycardia induced cardiomyopathy: EF 55 to 60% by echo in 2019.  EF 59% by stress testing.  Euvolemic on exam.  6.  Disposition: Follow-up lab work in preparation for catheterization including COVID testing approximately 3 to 4 days prior to cath.  Follow-up in clinic 2 weeks post cath.  Murray Hodgkins, NP 07/18/2019, 1:13 PM

## 2019-07-18 NOTE — Patient Instructions (Addendum)
Medication Instructions:  Your physician recommends that you continue on your current medications as directed. Please refer to the Current Medication list given to you today.  If you need a refill on your cardiac medications before your next appointment, please call your pharmacy.   Lab work: Your physician recommends that you have lab work today(CBC, BMET) 2- Covid Testing Wednesday [phone])  If you have labs (blood work) drawn today and your tests are completely normal, you will receive your results only by: [name] MyChart Message (if you have MyChart) OR . A paper copy in the mail If you have any lab test that is abnormal or we need to change your treatment, we will call you to review the results.  Testing/Procedures: 1-    [address], [place] [address] Dept: [phone] Loc: [phone]  [name] [name]  07/18/2019  You are scheduled for a Cardiac Catheterization on Friday, August 7 with Dr. [name].  1. Please arrive at the medical mall of [facility] at 7:30A.   Special note: Every effort is made to have your procedure done on time. Please understand that emergencies sometimes delay scheduled procedures.  2. Diet: Do not eat solid foods after midnight.  The patient may have clear liquids until 5am upon the day of the procedure.  3. Labs: You will need to have blood drawn on: today  4. Medication instructions in preparation for your procedure: Stop taking Eliquis (Apixiban) on Wednesday, August 5.  5. Plan for one night stay--bring personal belongings. 6. Bring a current list of your medications and current insurance cards. 7. You MUST have a responsible person to drive you home. 8. Someone MUST be with you the first 24 hours after you arrive home or your discharge will be delayed. 9. Please wear clothes that are easy to get on and off and wear slip-on  shoes.  Thank you for allowing [place] to care for you!   -- [facility]   Follow-Up: At [facility], you and your health needs are our priority.  As part of our continuing mission to provide you with exceptional heart care, we have created designated Provider Care Teams.  These Care Teams include your primary Cardiologist (physician) and Advanced Practice Providers (APPs -  Physician Assistants and Nurse Practitioners) who all work together to provide you with the care you need, when you need it. You will need a follow up appointment in 3 weeks.  You may see [name], MD or Murray Hodgkins, NP.

## 2019-07-19 ENCOUNTER — Encounter: Payer: Self-pay | Admitting: *Deleted

## 2019-07-19 LAB — CBC
Hematocrit: 41.4 % (ref 34.0–46.6)
Hemoglobin: 13.7 g/dL (ref 11.1–15.9)
MCH: 31.5 pg (ref 26.6–33.0)
MCHC: 33.1 g/dL (ref 31.5–35.7)
MCV: 95 fL (ref 79–97)
Platelets: 261 10*3/uL (ref 150–450)
RBC: 4.35 x10E6/uL (ref 3.77–5.28)
RDW: 12.4 % (ref 11.7–15.4)
WBC: 8.1 10*3/uL (ref 3.4–10.8)

## 2019-07-19 LAB — BASIC METABOLIC PANEL
BUN/Creatinine Ratio: 20 (ref 12–28)
BUN: 17 mg/dL (ref 8–27)
CO2: 22 mmol/L (ref 20–29)
Calcium: 9.5 mg/dL (ref 8.7–10.3)
Chloride: 103 mmol/L (ref 96–106)
Creatinine, Ser: 0.87 mg/dL (ref 0.57–1.00)
GFR calc Af Amer: 72 mL/min/{1.73_m2} (ref >59)
GFR calc non Af Amer: 63 mL/min/{1.73_m2} (ref >59)
Glucose: 82 mg/dL (ref 65–99)
Potassium: 5.1 mmol/L (ref 3.5–5.2)
Sodium: 141 mmol/L (ref 134–144)

## 2019-07-20 ENCOUNTER — Other Ambulatory Visit: Payer: Self-pay | Admitting: *Deleted

## 2019-07-20 ENCOUNTER — Other Ambulatory Visit: Payer: Self-pay | Admitting: Cardiovascular Disease

## 2019-07-20 NOTE — Telephone Encounter (Signed)
Spoke with pt who states copay for Eliquis will cost > $400 for a 1 month supply in the donut hole. She states she spoke with Johny Shock, RN with [facility] and mentioned that she or a pharmacist would follow up with pt to help with copay assistance. Will route to [place] for follow up with pt.

## 2019-07-20 NOTE — Telephone Encounter (Signed)
Patient states she needs a written rx for Eliquis. States she is in the donut hole and needs it to get rx from [place]. Please call to discuss.

## 2019-07-20 NOTE — Patient Outreach (Signed)
[facility]) Care Management  07/20/2019  [name] [name] 04-Jul-1936 [id number]   RN Health Coach received a self referral from patient.  Hip[employer] compliance verified.Per patient she is in the donut hole and needs some assistance with Eliquis. Patient read brochures that was sent to her husband and wants to know if [place] can assist her.  Plan: Referral to pharmacy for medication assistance  [place] Management [phone]

## 2019-07-23 ENCOUNTER — Telehealth: Payer: Self-pay | Admitting: Pharmacist

## 2019-07-23 NOTE — Patient Outreach (Addendum)
[facility]) Care Management  [place]   07/23/2019  [name] [name] [birth year] [id number]  Reason for referral: Medication Assistance with  Eliquis  Referral source: Patient (Self-Referral) Current insurance: [facility]  PMHx includes but not limited to:   Anxiety, Depression, Chest pain, hypertension, GERD, hyperlipidemia, Atrial Fibrillation, Vitamin D deficiency  Outreach:  Successful telephone call with patient.  HIPAA identifiers verified.   Subjective:  Patient referred herself to [place] because she was concerned about the copay of her Eliquis.  She reported the price of Eliquis from her pharmacy was >$300.  She also wondered if she could   Objective: The ASCVD Risk score [name], et al., 2013) failed to calculate for the following reasons:   The 2013 ASCVD risk score is only valid for ages 24 to 68  Lab Results  Component Value Date   CREATININE 0.87 07/18/2019   CREATININE 0.91 02/15/2019   CREATININE 0.75 02/12/2019    Lab Results  Component Value Date   HGBA1C 5.7 04/08/2017    Lipid Panel     Component Value Date/Time   CHOL 147 07/22/2017 1332   TRIG 51 07/22/2017 1332   HDL 63 07/22/2017 1332   CHOLHDL 2.3 07/22/2017 1332   VLDL 10 07/22/2017 1332   LDLCALC 74 07/22/2017 1332    BP Readings from Last 3 Encounters:  07/18/19 130/80  06/29/19 (!) 142/82  06/07/19 (!) 147/86    Allergies  Allergen Reactions  . Iodine Anaphylaxis    NO PROBLEMS WITH BETADINE  . Amiodarone     Cannot remember   . Biaxin [Clarithromycin] Nausea And Vomiting  . Codeine Nausea And Vomiting  . Digoxin And Related Other (See Comments)    Fatigue, eye puffiness, hoarsness  . Enalapril Other (See Comments)    unknown  . Fluocinonide Other (See Comments)    Tingling sensation in head and redness to scalp.  . Iodinated Diagnostic Agents Other (See Comments)    Tachycardia  . Oxycodone Nausea And Vomiting  .  Promethazine Other (See Comments)    Unknown   . Tizanidine Hcl Other (See Comments)    hypotension hypotension  . Warfarin And Related     Bleeding   . Xarelto [Rivaroxaban] Other (See Comments)    Unknown     Medications Reviewed Today    Reviewed by Theora Gianotti, NP (Nurse Practitioner) on 07/18/19 at 1312  Med List Status: <None>  Medication Order Taking? Sig Documenting Provider Last Dose Status Informant  acetaminophen (TYLENOL) 500 MG tablet [id number] Yes Take 1,000 mg by mouth every 6 (six) hours as needed (pain).  [provider] Taking Active Self  aluminum-magnesium hydroxide 200-200 MG/5ML suspension [id number] Yes Take 20 mLs by mouth every 6 (six) hours as needed for indigestion.  [provider] Taking Active Self  cholecalciferol (VITAMIN D) 1000 units tablet [id number] Yes Take 1,000 Units by mouth daily. [provider] Taking Active Self  Coenzyme Q10 (COQ10 PO) [id number] Yes Take by mouth daily. [provider] Taking Active   diltiazem (CARDIZEM CD) 180 MG 24 hr capsule [id number] Yes TAKE 1 CAPSULE BY MOUTH EVERY EVENING Wellington Hampshire, MD Taking Active   ELIQUIS 5 MG TABS tablet [id number] Yes TAKE ONE TABLET BY MOUTH TWICE DAILY Wellington Hampshire, MD Taking Active   metoprolol tartrate (LOPRESSOR) 100 MG tablet [id number] Yes Take 1 tablet (100 mg total) by mouth 2 (two) times daily.  Patient taking differently:  Take 50 mg by mouth 3 (three) times daily.    Theora Gianotti, NP Taking Active   OVER THE COUNTER MEDICATION [id number] Yes Saline nasal spray [provider] Taking Active   pantoprazole (PROTONIX) 40 MG tablet [id number] Yes TAKE ONE TABLET BY MOUTH EVERY DAY Wellington Hampshire, MD Taking Active   pravastatin (PRAVACHOL) 20 MG tablet [id number] Yes TAKE ONE TABLET BY MOUTH EVERY DAY [name], MD Taking Active   Probiotic Product (PROBIOTIC ADVANCED PO) [id number] Yes Take 1 tablet by  mouth daily. [provider] Taking Active Self  traZODone (DESYREL) 50 MG tablet [id number] Yes Take 1 tablet (50 mg total) by mouth at bedtime. Earleen Newport, MD Taking Active           Assessment: Drugs sorted by system:  Cardiovascular:  Diltiazem, Eliquis, Metoprolol, Pravastatin  Pulmonary/Allergy: Saline Nasal Spray  Gastrointestinal: Aluminum/Magnesium hydroxide, Pantoprazole, Probiotic,   Pain: Acetaminophen,   Vitamins/Minerals/Supplements: Cholecalciferol, Coenzyme Q 10 .    Medication Assistance Findings:  Medication assistance needs identified: Eliquis  Extra Help:  Not eligible for Extra Help Low Income Subsidy based on reported income and assets  Patient Assistance Programs: Eliquis made by [place] requirement met: Yes o Out-of-pocket prescription expenditure met:   Unknown - Reviewed program requirements with patient.     Additional medication assistance options reviewed with patient as warranted:  No other options identified  Plan: . I will route patient assistance letter to [place] technician who will coordinate patient assistance program application process for medications listed above.  [facility] pharmacy technician will assist with obtaining all required documents from both patient and provider(s) and submit application(s) once completed.  . Will route note to Dr. Fletcher Anon.  . Will follow-up in 4-6 weeks.  Elayne Guerin, PharmD, [place] Clinical Pharmacist [phone]

## 2019-07-24 ENCOUNTER — Other Ambulatory Visit: Payer: Self-pay | Admitting: Pharmacy Technician

## 2019-07-24 ENCOUNTER — Other Ambulatory Visit: Payer: Self-pay

## 2019-07-24 ENCOUNTER — Telehealth: Payer: Self-pay | Admitting: Cardiovascular Disease

## 2019-07-24 ENCOUNTER — Other Ambulatory Visit
Admission: RE | Admit: 2019-07-24 | Discharge: 2019-07-24 | Disposition: A | Discharge status: Home / Self Care | Payer: Medicare Other | Source: Ambulatory Visit | Attending: Cardiovascular Disease | Admitting: Cardiovascular Disease

## 2019-07-24 DIAGNOSIS — Z01812 Encounter for preprocedural laboratory examination: Secondary | ICD-10-CM | POA: Diagnosis not present

## 2019-07-24 DIAGNOSIS — Z20828 Contact with and (suspected) exposure to other viral communicable diseases: Secondary | ICD-10-CM | POA: Insufficient documentation

## 2019-07-24 LAB — SARS CORONAVIRUS 2 (TAT 6-24 HRS): SARS Coronavirus 2: NEGATIVE

## 2019-07-24 NOTE — Telephone Encounter (Signed)
-----   Message from Elayne Guerin, [facility] sent at 07/24/2019  9:28 AM EDT ----- Thank you so much! ----- Message ----- From: Lamar Laundry, RN Sent: 07/24/2019   7:52 AM EDT To: Wellington Hampshire, MD, Elayne Guerin, [place],  Thank you for assisting Mrs.Olenick. I will look out for the paperwork. We have a limited supply of Eliquis samples due to COVID. I will check to see what we have available and follow up with Mrs. Hefel.  Regards, Lattie Haw  ----- Message ----- From: Wellington Hampshire, MD Sent: 07/23/2019   5:22 PM EDT To: Lamar Laundry, RN, Elayne Guerin, [facility]  I added my nurse to assist with this.  ----- Message ----- From: Elayne Guerin, [facility] Sent: 07/23/2019   4:36 PM EDT To: Wellington Hampshire, MD  Dr. Fletcher Anon,  Please see the attached [facility] Pharmacist's note. Patient referred herself to [facility] due to medication affordability issues with Eliquis. Patient is in the donut hole and reported her copay was >$400.  She meets the financial requirements to receive Eliquis from [place] Patient Assistance Program at no cost.   If deemed therapeutically appropriate, please sign and send the forms that will be sent to you back to [facility]. [facility] will handle the correspondence with the program.    In addition, if you could provide the patient with a few more weeks of samples she would most appreciate it. The medication assistance process takes about 4-6 weeks.  Thank you so much for your time and consideration.  Blessings,  Elayne Guerin, PharmD, [place] Clinical Pharmacist [phone]

## 2019-07-24 NOTE — Telephone Encounter (Signed)
Medication Samples have been provided to the patient.  Drug name: Eliquis    Strength: 5mg       Qty: 4 boxes  LOT: [id number]/ [id number] Exp.Date: 08/2021, 05/2021  Dosing instructions:1 tablet twice daily  Patient made aware that samples of Eliquis will be left at the front desk for her to pick up. Patient verbalized understanding and voiced appreciation for the assistance.

## 2019-07-24 NOTE — Patient Outreach (Signed)
[facility]) Care Management  07/24/2019  [name] [name] Nov 06, 1936 [id number]    Error.  [name] P. Cruise Baumgardner, [place] Management [phone]

## 2019-07-24 NOTE — Telephone Encounter (Signed)
Please call to discuss which medications Dr. Fletcher Anon would like for her to stop.

## 2019-07-24 NOTE — Patient Outreach (Signed)
[facility]) Care Management  07/24/2019  LISA MILIAN 24-Dec-1935 [id number]                           Medication Assistance Referral  Referral From: [facility]  Medication/Company: ELiquis / BMS Patient application portion:  Mailed Provider application portion: Faxed  to [employer]    Follow up:  Will follow up with patient in 5-10 business days to confirm application(s) have been received.  Grisela Mesch P. Calin Fantroy, [place] Management [phone]

## 2019-07-24 NOTE — Telephone Encounter (Signed)
Returned call to patient. Pt calling to confirm when to stop Eliquis in preporation for cath on Friday.   Pt verbalized understanding to stop taking Eliquis after evening dose today.   Advised pt to call for any further questions or concerns.

## 2019-07-26 ENCOUNTER — Telehealth: Payer: Self-pay

## 2019-07-26 NOTE — Telephone Encounter (Signed)
Provider portion of the [employer] [place] patient assistance form for Eliquis completed, signed by Dr. Fletcher Anon, and faxed to [facility] attn: Sharee Pimple as requested.

## 2019-07-27 ENCOUNTER — Ambulatory Visit
Admission: RE | Admit: 2019-07-27 | Discharge: 2019-07-27 | Disposition: A | Discharge status: Home / Self Care | Payer: Medicare Other | Attending: Cardiovascular Disease | Admitting: Cardiovascular Disease

## 2019-07-27 ENCOUNTER — Other Ambulatory Visit: Payer: Self-pay

## 2019-07-27 ENCOUNTER — Encounter
Admission: RE | Disposition: A | Discharge status: Home / Self Care | Payer: Medicare Other | Source: Home / Self Care | Attending: Cardiovascular Disease

## 2019-07-27 DIAGNOSIS — I4892 Unspecified atrial flutter: Secondary | ICD-10-CM | POA: Insufficient documentation

## 2019-07-27 DIAGNOSIS — I129 Hypertensive chronic kidney disease with stage 1 through stage 4 chronic kidney disease, or unspecified chronic kidney disease: Secondary | ICD-10-CM | POA: Diagnosis not present

## 2019-07-27 DIAGNOSIS — I200 Unstable angina: Secondary | ICD-10-CM | POA: Diagnosis not present

## 2019-07-27 DIAGNOSIS — R0609 Other forms of dyspnea: Secondary | ICD-10-CM | POA: Diagnosis not present

## 2019-07-27 DIAGNOSIS — N183 Chronic kidney disease, stage 3 (moderate): Secondary | ICD-10-CM | POA: Insufficient documentation

## 2019-07-27 DIAGNOSIS — I4821 Permanent atrial fibrillation: Secondary | ICD-10-CM | POA: Diagnosis not present

## 2019-07-27 DIAGNOSIS — Z79899 Other long term (current) drug therapy: Secondary | ICD-10-CM | POA: Insufficient documentation

## 2019-07-27 DIAGNOSIS — Z7901 Long term (current) use of anticoagulants: Secondary | ICD-10-CM | POA: Diagnosis not present

## 2019-07-27 DIAGNOSIS — K219 Gastro-esophageal reflux disease without esophagitis: Secondary | ICD-10-CM | POA: Insufficient documentation

## 2019-07-27 DIAGNOSIS — R9439 Abnormal result of other cardiovascular function study: Secondary | ICD-10-CM | POA: Diagnosis not present

## 2019-07-27 DIAGNOSIS — I872 Venous insufficiency (chronic) (peripheral): Secondary | ICD-10-CM | POA: Diagnosis not present

## 2019-07-27 DIAGNOSIS — E785 Hyperlipidemia, unspecified: Secondary | ICD-10-CM | POA: Insufficient documentation

## 2019-07-27 HISTORY — PX: LEFT HEART CATH AND CORONARY ANGIOGRAPHY: CATH118249

## 2019-07-27 SURGERY — LEFT HEART CATH AND CORONARY ANGIOGRAPHY
Anesthesia: Moderate Sedation | Laterality: Left

## 2019-07-27 MED ORDER — ASPIRIN 81 MG PO CHEW
81.0000 mg | CHEWABLE_TABLET | ORAL | Status: AC
  Administered 2019-07-27: 81 mg via ORAL

## 2019-07-27 MED ORDER — FENTANYL CITRATE (PF) 100 MCG/2ML IJ SOLN
INTRAMUSCULAR | Status: AC
  Filled 2019-07-27: qty 2

## 2019-07-27 MED ORDER — SODIUM CHLORIDE 0.9% FLUSH: 3.0000 mL | Freq: Two times a day (BID) | INTRAVENOUS | Status: DC

## 2019-07-27 MED ORDER — ASPIRIN 81 MG PO CHEW
CHEWABLE_TABLET | ORAL | Status: AC
  Filled 2019-07-27: qty 1

## 2019-07-27 MED ORDER — MIDAZOLAM HCL 2 MG/2ML IJ SOLN
INTRAMUSCULAR | Status: AC
  Filled 2019-07-27: qty 2

## 2019-07-27 MED ORDER — SODIUM CHLORIDE 0.9% FLUSH: 3.0000 mL | INTRAVENOUS | Status: DC | PRN

## 2019-07-27 MED ORDER — HEPARIN SODIUM (PORCINE) 1000 UNIT/ML IJ SOLN
INTRAMUSCULAR | Status: DC | PRN
  Administered 2019-07-27: 3500 [IU] via INTRAVENOUS

## 2019-07-27 MED ORDER — ONDANSETRON HCL 4 MG/2ML IJ SOLN: 4.0000 mg | Freq: Four times a day (QID) | INTRAMUSCULAR | Status: DC | PRN

## 2019-07-27 MED ORDER — VERAPAMIL HCL 2.5 MG/ML IV SOLN
INTRAVENOUS | Status: AC
  Filled 2019-07-27: qty 2

## 2019-07-27 MED ORDER — FENTANYL CITRATE (PF) 100 MCG/2ML IJ SOLN
INTRAMUSCULAR | Status: DC | PRN
  Administered 2019-07-27: 25 ug via INTRAVENOUS

## 2019-07-27 MED ORDER — SODIUM CHLORIDE 0.9 % WEIGHT BASED INFUSION
3.0000 mL/kg/h | INTRAVENOUS | Status: AC
  Administered 2019-07-27: 3 mL/kg/h via INTRAVENOUS

## 2019-07-27 MED ORDER — SODIUM CHLORIDE 0.9 % IV SOLN: 250.0000 mL | INTRAVENOUS | Status: DC | PRN

## 2019-07-27 MED ORDER — IOHEXOL 300 MG/ML  SOLN
INTRAMUSCULAR | Status: DC | PRN
  Administered 2019-07-27: 55 mL via INTRA_ARTERIAL

## 2019-07-27 MED ORDER — VERAPAMIL HCL 2.5 MG/ML IV SOLN
INTRAVENOUS | Status: DC | PRN
  Administered 2019-07-27: 2.5 mg via INTRA_ARTERIAL

## 2019-07-27 MED ORDER — SODIUM CHLORIDE 0.9 % IV SOLN: INTRAVENOUS | Status: DC

## 2019-07-27 MED ORDER — MIDAZOLAM HCL 2 MG/2ML IJ SOLN
INTRAMUSCULAR | Status: DC | PRN
  Administered 2019-07-27: 1 mg via INTRAVENOUS

## 2019-07-27 MED ORDER — HEPARIN (PORCINE) IN NACL 1000-0.9 UT/500ML-% IV SOLN
INTRAVENOUS | Status: AC
  Filled 2019-07-27: qty 1000

## 2019-07-27 MED ORDER — SODIUM CHLORIDE 0.9 % WEIGHT BASED INFUSION: 1.0000 mL/kg/h | INTRAVENOUS | Status: DC

## 2019-07-27 MED ORDER — HEPARIN SODIUM (PORCINE) 1000 UNIT/ML IJ SOLN
INTRAMUSCULAR | Status: AC
  Filled 2019-07-27: qty 1

## 2019-07-27 MED ORDER — ACETAMINOPHEN 325 MG PO TABS: 650.0000 mg | ORAL_TABLET | ORAL | Status: DC | PRN

## 2019-07-27 SURGICAL SUPPLY — 8 items
CATH INFINITI 5FR JK (CATHETERS) ×1 IMPLANT
CATH INFINITI JR4 5F (CATHETERS) ×1 IMPLANT
DEVICE RAD TR BAND REGULAR (VASCULAR PRODUCTS) ×1 IMPLANT
GLIDESHEATH SLEND SS 6F .021 (SHEATH) ×1 IMPLANT
KIT MANI 3VAL PERCEP (MISCELLANEOUS) ×2 IMPLANT
PACK CARDIAC CATH (CUSTOM PROCEDURE TRAY) ×2 IMPLANT
WIRE HITORQ VERSACORE ST 145CM (WIRE) ×1 IMPLANT
WIRE ROSEN-J .035X260CM (WIRE) ×1 IMPLANT

## 2019-07-27 NOTE — Interval H&P Note (Signed)
Cath Lab Visit (complete for each Cath Lab visit)  Clinical Evaluation Leading to the Procedure:   ACS: No.  Non-ACS:    Anginal Classification: CCS III  Anti-ischemic medical therapy: Maximal Therapy (2 or more classes of medications)  Non-Invasive Test Results: Intermediate-risk stress test findings: cardiac mortality 1-3%/year  Prior CABG: No previous CABG      History and Physical Interval Note:  07/27/2019 8:52 AM  [name] [name]  has presented today for surgery, with the diagnosis of LT Heart Cath   Unstable angina   Abnormal stress test.  The various methods of treatment have been discussed with the patient and family. After consideration of risks, benefits and other options for treatment, the patient has consented to  Procedure(s): LEFT HEART CATH AND CORONARY ANGIOGRAPHY (Left) as a surgical intervention.  The patient's history has been reviewed, patient examined, no change in status, stable for surgery.  I have reviewed the patient's chart and labs.  Questions were answered to the patient's satisfaction.     [name]

## 2019-07-27 NOTE — Progress Notes (Signed)
Patient remains clinically stable post heart cath per DR Fletcher Anon, vitals stable. TR band intact, no bleeding nor hematoma at right radial/elevated to pillow. Denies complaints.

## 2019-07-27 NOTE — Discharge Instructions (Signed)
Radial Site Care ° °This sheet gives you information about how to care for yourself after your procedure. Your health care provider may also give you more specific instructions. If you have problems or questions, contact your health care provider. °What can I expect after the procedure? °After the procedure, it is common to have: °· Bruising and tenderness at the catheter insertion area. °Follow these instructions at home: °Medicines °· Take over-the-counter and prescription medicines only as told by your health care provider. °Insertion site care °· Follow instructions from your health care provider about how to take care of your insertion site. Make sure you: °? Wash your hands with soap and water before you change your bandage (dressing). If soap and water are not available, use hand sanitizer. °? Change your dressing as told by your health care provider. °? Leave stitches (sutures), skin glue, or adhesive strips in place. These skin closures may need to stay in place for 2 weeks or longer. If adhesive strip edges start to loosen and curl up, you may trim the loose edges. Do not remove adhesive strips completely unless your health care provider tells you to do that. °· Check your insertion site every day for signs of infection. Check for: °? Redness, swelling, or pain. °? Fluid or blood. °? Pus or a bad smell. °? Warmth. °· Do not take baths, swim, or use a hot tub until your health care provider approves. °· You may shower 24-48 hours after the procedure, or as directed by your health care provider. °? Remove the dressing and gently wash the site with plain soap and water. °? Pat the area dry with a clean towel. °? Do not rub the site. That could cause bleeding. °· Do not apply powder or lotion to the site. °Activity ° °· For 24 hours after the procedure, or as directed by your health care provider: °? Do not flex or bend the affected arm. °? Do not push or pull heavy objects with the affected arm. °? Do not  drive yourself home from the hospital or clinic. You may drive 24 hours after the procedure unless your health care provider tells you not to. °? Do not operate machinery or power tools. °· Do not lift anything that is heavier than 10 lb (4.5 kg), or the limit that you are told, until your health care provider says that it is safe. °· Ask your health care provider when it is okay to: °? Return to work or school. °? Resume usual physical activities or sports. °? Resume sexual activity. °General instructions °· If the catheter site starts to bleed, raise your arm and put firm pressure on the site. If the bleeding does not stop, get help right away. This is a medical emergency. °· If you went home on the same day as your procedure, a responsible adult should be with you for the first 24 hours after you arrive home. °· Keep all follow-up visits as told by your health care provider. This is important. °Contact a health care provider if: °· You have a fever. °· You have redness, swelling, or yellow drainage around your insertion site. °Get help right away if: °· You have unusual pain at the radial site. °· The catheter insertion area swells very fast. °· The insertion area is bleeding, and the bleeding does not stop when you hold steady pressure on the area. °· Your arm or hand becomes pale, cool, tingly, or numb. °These symptoms may represent a serious problem   that is an emergency. Do not wait to see if the symptoms will go away. Get medical help right away. Call your local emergency services (911 in the [place] Do not drive yourself to the hospital. Summary  After the procedure, it is common to have bruising and tenderness at the site.  Follow instructions from your health care provider about how to take care of your radial site wound. Check the wound every day for signs of infection.  Do not lift anything that is heavier than 10 lb (4.5 kg), or the limit that you are told, until your health care provider says  that it is safe. This information is not intended to replace advice given to you by your health care provider. Make sure you discuss any questions you have with your health care provider. Document Released: 01/08/2011 Document Revised: 01/11/2018 Document Reviewed: 01/11/2018 Elsevier Patient Education  2020 [name] tomorrow morning as long as no bleeding issues from the right radial artery.

## 2019-07-30 ENCOUNTER — Encounter: Payer: Self-pay | Admitting: Cardiovascular Disease

## 2019-08-01 ENCOUNTER — Other Ambulatory Visit: Payer: Self-pay | Admitting: Pharmacy Technician

## 2019-08-01 ENCOUNTER — Other Ambulatory Visit: Payer: Self-pay

## 2019-08-01 NOTE — Patient Outreach (Signed)
[place] [facility]) Care Management  08/01/2019  [name] [name] 08/07/1936 [id number]  Successful outreach call placed to patient in regards to BMS application for Eliquis.  Spoke to patient, HIPAA identifiers verified.  Patient informed she has received a lot of information from [facility] in the past few weeks. While on the phone call, patient was able to locate the patient assistance application. Discussed with patient the information that was needed including the original application, proof of house hold income and examples as well as a pharmacy printout detailing patient's out of pocket spend for 2020 for both herself and her husband. Patient verbalized understanding.  Patient informed she would gather this information and have the application in the mail in the next few days.  Will followup with patient in 10-14 business days if application has not been received back.  Berklie Dethlefs P. Siyona Coto, [place] Management [phone]

## 2019-08-02 ENCOUNTER — Ambulatory Visit (INDEPENDENT_AMBULATORY_CARE_PROVIDER_SITE_OTHER): Payer: Medicare Other | Admitting: Family Medicine

## 2019-08-02 ENCOUNTER — Other Ambulatory Visit: Payer: Self-pay

## 2019-08-02 ENCOUNTER — Encounter: Payer: Self-pay | Admitting: Family Medicine

## 2019-08-02 VITALS — BP 128/72 | HR 78 | Temp 96.8°F | Resp 18 | Ht 64.0 in | Wt 165.0 lb

## 2019-08-02 DIAGNOSIS — K1379 Other lesions of oral mucosa: Secondary | ICD-10-CM | POA: Diagnosis not present

## 2019-08-02 MED ORDER — CLINDAMYCIN HCL 150 MG PO CAPS: 150.0000 mg | ORAL_CAPSULE | Freq: Three times a day (TID) | ORAL | 0 refills | Status: DC

## 2019-08-02 NOTE — Progress Notes (Signed)
Subjective:    Patient ID: [name] [name], female    DOB: 08-18-1936, 83 y.o.   MRN: [id number]  HPI   Patient resents to clinic due to sore spot on the inside of her mouth.  Patient states she does have a tooth on that lower right jaw that is sharper and believes it cut the inside of her mouth when she was chewing food.  Patient had some leftover chlorhexidine mouth rinse that had been prescribed to her by her dentist and has been using this twice a day for the past 3 to 4 days.  She did reach out to her dentist, and has appointment with them next week.  Was concerned that the spot in mouth is not healing as quickly as it should and wanted to be checked out.  Denies any bleeding, or oozing type drainage from the spot.  States the sore spot is a little tender to touch.   Patient Active Problem List   Diagnosis Date Noted  . Abnormal stress test   . Close Exposure to Covid-19 Virus 06/11/2019  . Elbow effusion, left 03/10/2019  . Headache 02/17/2019  . Pain of right scapula 08/01/2017  . Chest pain 07/22/2017  . Sinusitis 06/14/2017  . Influenza 01/10/2017  . Rash and nonspecific skin eruption 11/25/2016  . Concussion with no loss of consciousness 09/01/2016  . Chronic venous insufficiency 09/01/2016  . Preoperative clearance 06/29/2016  . Other fatigue 06/29/2016  . Visit for preventive health examination 12/15/2015  . GERD (gastroesophageal reflux disease) 11/20/2015  . Cystocele 11/18/2015  . Vaginal atrophy 11/18/2015  . Chronic suprapubic pain 10/14/2015  . Inguinal hernia 10/13/2015  . Impaired fasting glucose 04/24/2015  . Pulmonary hypertension, moderate to severe ([place]) 04/24/2015  . Arthritis of knee, degenerative 02/21/2015  . Vitamin D deficiency 12/27/2014  . S/P TAH-BSO 12/13/2014  . S/P bilateral mastectomy 12/13/2014  . Long term current use of anticoagulant therapy 09/25/2014  . HH (hiatus hernia) 04/20/2014  . Tachycardia induced cardiomyopathy ([place]) 07/20/2013   . Unspecified vitamin D deficiency 04/24/2013  . Family history of breast cancer in female 04/23/2013  . Medicare annual wellness visit, subsequent 04/23/2013  . Obesity 06/30/2012  . History of [place] spotted fever 06/22/2012  . Persistent atrial fibrillation 05/09/2012  . Anxiety and depression 05/09/2012  . TACHYCARDIA 02/01/2011  . Hyperlipidemia 07/08/2010  . Essential hypertension 03/05/2009   Social History   Tobacco Use  . Smoking status: Never Smoker  . Smokeless tobacco: Never Used  Substance Use Topics  . Alcohol use: No   Review of Systems  Constitutional: Negative for chills, fatigue and fever.  HENT: Negative for congestion, ear pain, sinus pain and sore throat. +spot right side mouth, tooth cut inside   Eyes: Negative.   Respiratory: Negative for cough, shortness of breath and wheezing.   Cardiovascular: Negative for chest pain, palpitations and leg swelling.  Gastrointestinal: Negative for abdominal pain, diarrhea, nausea and vomiting.  Genitourinary: Negative for dysuria, frequency and urgency.  Musculoskeletal: Negative for arthralgias and myalgias.  Skin: Negative for color change, pallor and rash.  Neurological: Negative for syncope, light-headedness and headaches.  Psychiatric/Behavioral: The patient is not nervous/anxious.       Objective:   Physical Exam Vitals signs and nursing note reviewed.  Constitutional:      General: She is not in acute distress.    Appearance: She is not ill-appearing, toxic-appearing or diaphoretic.  HENT:     Head: Normocephalic and atraumatic.  Mouth/Throat:     Mouth: Mucous membranes are moist.     Tongue: No lesions. Tongue does not deviate from midline.     Pharynx: Oropharynx is clear.     Comments: Small sore on inside of right buccal mucosa, about size of small pea.  Appears consistent with story of tooth cutting inside of mouth. Eyes:     General: No scleral icterus.    Extraocular Movements:  Extraocular movements intact.     Pupils: Pupils are equal, round, and reactive to light.  Cardiovascular:     Rate and Rhythm: Normal rate.  Pulmonary:     Effort: Pulmonary effort is normal. No respiratory distress.     Breath sounds: Normal breath sounds.  Neurological:     Mental Status: She is alert and oriented to person, place, and time.     Gait: Gait normal.  Psychiatric:        Mood and Affect: Mood normal.        Behavior: Behavior normal.    Today's Vitals   08/02/19 1417  BP: 128/72  Pulse: 78  Resp: 18  Temp: (!) 96.8 F (36 C)  TempSrc: Temporal  SpO2: 95%  Weight: 165 lb (74.8 kg)  Height: 5\' 4"  (1.626 m)   Body mass index is 28.32 kg/m.     Assessment & Plan:    Sore in mouth - patient advised to continue using chlorhexidine mouthwash as prescribed by dentist to keep dental appointment as planned.  Due to sore area in mouth not healing as quickly as we would like, we will cover her with clindamycin tablets to prevent any infection.  She will otherwise keep regularly scheduled appt with PCP

## 2019-08-02 NOTE — Patient Instructions (Signed)
KEEP DOING RINSE FROM DENTIST  TAKE CLINDAMYCIN COURSE  KEEP DENTAL APPOINTMENT NEXT WEEK

## 2019-08-03 ENCOUNTER — Other Ambulatory Visit: Payer: Self-pay | Admitting: Cardiovascular Disease

## 2019-08-03 ENCOUNTER — Other Ambulatory Visit: Payer: Self-pay | Admitting: Pharmacy Technician

## 2019-08-03 NOTE — Patient Outreach (Signed)
[place] [facility]) Care Management  08/03/2019  [name] [name] 11/04/1936 [id number]  Incoming call received from patient in regards to BMS application for Eliquis.  Spoke to patient, HIPAA identifiers verified.  Patient informed she was ready to mail in her application but that she wanted to make sure she had all the information that was required before she put it in the mail. She informed that she had the application, her and her husband's social security statements and her and her husband's OOP spend from the pharmacy. Informed patient that was all the required information that was requested from the patient assistance company. Patient informed she would place in the mail today.  Will followup with patient in 10-15 business days if not received back.  [name] P. [name], [name] Management [phone]

## 2019-08-07 ENCOUNTER — Other Ambulatory Visit: Payer: Self-pay | Admitting: Pharmacy Technician

## 2019-08-07 NOTE — Patient Outreach (Signed)
[facility]) Care Management  08/07/2019  OKTOBER GLAZER [birth year] [id number]   Received all necessary documents and signatures from both patient and provider for [employer] patient assistance for Eliquis.  Submitted completed application to BMS via fax.  Will followup with [employer] in 5-10 business days to inquire on status of application.  Bernece Gall P. Tenicia Gural, [place] Management [phone]

## 2019-08-09 ENCOUNTER — Telehealth: Payer: Self-pay | Admitting: *Deleted

## 2019-08-09 NOTE — Telephone Encounter (Signed)
Pt has been approved for patient assistance via fax 08/08/2019-12/20/2019.

## 2019-08-13 ENCOUNTER — Other Ambulatory Visit: Payer: Self-pay | Admitting: Pharmacy Technician

## 2019-08-13 NOTE — Patient Outreach (Signed)
[place] [facility]) Care Management  08/13/2019  [name] [name] Apr 20, 1936 [name]  Addendum  Successful outreach call placed to patient in regards to BMS application for Eliquis.  Spoke to patient, HIPAA identifiers verified.  Informed patient that she had been approved for the program and that she will be getting a phone call from [facility] Pharmacy to set up delivery of the Eliquis. Provided patient the phone number to call [name] on Wednesday if she has not heard from them. Patient verbalized understanding.  Patient informed she received a call from her local pharmacy that her Eliquis was ready to be picked up but that she did not reorder it. Informed patient if she currently had enough Eliquis until the BMS shipment arrive which could take up to 10 days, then she could call and cancel the order with the local pharmacy and inform them she was receiving it through the manufacturer. Patient verbalized understanding.  Will followup with patient in 5-10 business days to confirm receipt of medicaiton.  [name] [name], [place] Management [phone]

## 2019-08-13 NOTE — Patient Outreach (Signed)
[place] [facility]) Care Management  08/13/2019  [name] [name] [birth year] [id number]   Care coordination call placed to BMS in regards to patient's Eliquis application.  Spoke to [name] who informed patient had been APPROVED 08/08/2019-12/20/2019. [name] informed an order was sent to the pharmacy on 08/08/2019. She informed patient should be receiving a call any day now from [name] to set up shipping. She informed if patient has not heard from [name] by Wednesday then she should call them at [phone] to set up delivery.  Will followup with patient.  Aleni Andrus P. Alandis Bluemel, [place] Management [phone]

## 2019-08-15 ENCOUNTER — Telehealth: Payer: Self-pay | Admitting: Cardiovascular Disease

## 2019-08-15 NOTE — Telephone Encounter (Signed)
Please call to discuss Eliquis rx. States she was approved for patient assistance. She is not sure if her rx will be shipped to her or to [place] office. Please call to discuss.

## 2019-08-15 NOTE — Telephone Encounter (Signed)
I spoke with the patient. I advised her I found a copy of her application that Abran Richard, RN for Dr. Fletcher Anon, faxed back to [facility] on 8/6. The application was marked for the eliquis to be shipped to her home address.   The patient voices understanding.

## 2019-08-17 ENCOUNTER — Other Ambulatory Visit: Payer: Self-pay | Admitting: Pharmacy Technician

## 2019-08-17 NOTE — Patient Outreach (Signed)
[facility]) Care Management  08/17/2019  [name] [name] 1936-05-18 [name]   Incoming call received from patient in regards to BMS application for Eliquis, HIPAA identifiers verified.  Spoke to patient who informed she had received a voicemail from a "[place]" at [phone] asking for a return call and she was inquiring if I knew if that was about patient assistance.  Informed patient that she should be receiving a call either from [place] at [phone] or their pharmacy [name] at [phone] and they would identify themselves and state the reason for the call which is to set up delivery of her medication. Patient verbalized understanding and appreciated me taking the call.  Will followup with patient as previously scheduled.  [name] P. Padraig Nhan, [place] Management [phone]

## 2019-08-21 ENCOUNTER — Other Ambulatory Visit: Payer: Self-pay | Admitting: Pharmacy Technician

## 2019-08-21 NOTE — Patient Outreach (Signed)
[place] [facility]) Care Management  08/21/2019  PERLINE SRINIVASAN 22-Jan-1936 [name]    Incoming call received from patient in regards to BMS application for Eliquis.  Spoke to patient, HIPAA identifiers verified.  Patient was calling to inform that the phone number provided to BMS would no longer work and she was wondering how they would get in touch with her to set up shipment. She informed the best number to reach her now was [phone]. Provided patient the phone number to [facility], the pharmacy for BMS, so that she could call and set up her delivery.  Will followup with patient as previously scheduled.  Terion Hedman P. Beverlie Kurihara, [place] Management [phone]

## 2019-08-23 ENCOUNTER — Other Ambulatory Visit: Payer: Self-pay

## 2019-08-23 ENCOUNTER — Encounter: Payer: Self-pay | Admitting: Cardiovascular Disease

## 2019-08-23 ENCOUNTER — Ambulatory Visit (INDEPENDENT_AMBULATORY_CARE_PROVIDER_SITE_OTHER): Payer: Medicare Other | Admitting: Cardiovascular Disease

## 2019-08-23 VITALS — BP 120/80 | HR 83 | Ht 64.5 in | Wt 162.5 lb

## 2019-08-23 DIAGNOSIS — I4821 Permanent atrial fibrillation: Secondary | ICD-10-CM

## 2019-08-23 DIAGNOSIS — R0789 Other chest pain: Secondary | ICD-10-CM | POA: Diagnosis not present

## 2019-08-23 DIAGNOSIS — I10 Essential (primary) hypertension: Secondary | ICD-10-CM | POA: Diagnosis not present

## 2019-08-23 NOTE — Patient Instructions (Signed)
Medication Instructions:  Your physician recommends that you continue on your current medications as directed. Please refer to the Current Medication list given to you today.  If you need a refill on your cardiac medications before your next appointment, please call your pharmacy.   Lab work: None ordered If you have labs (blood work) drawn today and your tests are completely normal, you will receive your results only by: . MyChart Message (if you have MyChart) OR . A paper copy in the mail If you have any lab test that is abnormal or we need to change your treatment, we will call you to review the results.  Testing/Procedures: None ordered  Follow-Up: At [facility], you and your health needs are our priority.  As part of our continuing mission to provide you with exceptional heart care, we have created designated Provider Care Teams.  These Care Teams include your primary Cardiologist (physician) and Advanced Practice Providers (APPs -  Physician Assistants and Nurse Practitioners) who all work together to provide you with the care you need, when you need it. You will need a follow up appointment in 6 months.  Please call our office 2 months in advance to schedule this appointment.  You may see Muhammad Arida, MD or one of the following Advanced Practice Providers on your designated Care Team:   Christopher Berge, NP Ryan Dunn, PA-C . Jacquelyn Visser, PA-C  Any Other Special Instructions Will Be Listed Below (If Applicable). N/A   

## 2019-08-23 NOTE — Progress Notes (Signed)
Cardiology Office Note   Date:  08/23/2019   ID:  [name] [name], DOB Oct 03, 1936, MRN [id number]  PCP:  [name], MD  Cardiologist:   [name], MD   Chief Complaint  Patient presents with   other    1 month f/u lexi c/o sob and fatigue. Meds reviewed verbally with pt.      History of Present Illness: [name] [name] is a 83 y.o. female who presents for a followup visit regarding permanent atrial fibrillation.   She has known history of  atrial fibrillation/flutter status post multiple cardioversions in the past (total of 7). 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 She had cardiac catheterization twice in the past without significant coronary artery disease. Most recent cardioversion was in Feb of 2018. Echocardiogram in March, 2019 showed an EF of 55 to 60% with mildly dilated left atrium and minimal pulmonary hypertension. She is being treated with rate control at the present time.   She was seen recently for atypical chest pain.  She underwent a Lexiscan Myoview which showed possible inferolateral ischemia with normal ejection fraction.  Due to that, I proceeded with left heart catheterization which showed minor luminal irregularities with no evidence of obstructive coronary artery disease.  Ejection fraction was normal with mildly elevated left ventricular end-diastolic pressure.  She is doing reasonably well now with no recurrent chest pain.  She has mild fatigue and shortness of breath.  Ventricular rate has been well controlled overall.   Past Medical History:  Diagnosis Date   Arthritis    Atrial flutter ([place]) 02/2011   s/p cardioversion    Chest pain    a. H/o cardiac cath x 2, last 2012 -->nl cors;  b. 12/2016 MV: EF 61%, small region of mild perfusion defect in the apical anteroseptal region c/w breast attenuation, no ischemia-->Low risk; c. 06/2019 MV: Mod size, mild inflat ischemia, EF 59%. Cor and Ao Ca2+. Inflat  defect more pronounced on this study compared to last.   CKD (chronic kidney disease), stage III (HCC)    Cystocele    GERD (gastroesophageal reflux disease)    Headache(784.0)    chronic   Hyperlipidemia    Hypertension    Knee fracture    Mobitz type 2 second degree atrioventricular block    a. felt to be 2/2 amiodarone, resolved with decreased amiodarone dose.  Amio since d/c'd.   Permanent atrial fibrillation    a. status post multiple DCCVs; b. 2018 - eval for PVI but opted for rate control.   PONV (postoperative nausea and vomiting)    oxycodone and codiene cause N/V    Pre-syncope    a. In setting of dehydration and AKI in the past.   Tachycardia induced cardiomyopathy ([place])    a. Resolved;  b. 08/2017 Echo: EF 50-55%, no rwma, mild MR, mildly to mod dil LA/RA; c. 02/2018 Echo: EF 55-60%, mild MR. Mildly dil LA. Nl RVSP. PASP 33mmHg.   Venous insufficiency    Vertigo     Past Surgical History:  Procedure Laterality Date   ABDOMINAL HYSTERECTOMY  1990   APPENDECTOMY     AUGMENTATION MAMMAPLASTY Bilateral 1986   implants   AUGMENTATION MAMMAPLASTY  1990   AUGMENTATION MAMMAPLASTY  2011   CARDIAC CATHETERIZATION     CARDIOVERSION     x 3   CARDIOVERSION     CARDIOVERSION N/A 02/07/2017   Procedure: CARDIOVERSION;  Surgeon: Wellington Hampshire, MD;  Location:  [facility];  Service: Cardiovascular;  Laterality: N/A;   CARDIOVERSION N/A 07/22/2017   Procedure: Cardioversion;  Surgeon: Minna Merritts, MD;  Location: [facility];  Service: Cardiovascular;  Laterality: N/A;   CATARACT EXTRACTION W/PHACO Right 09/21/2016   Procedure: CATARACT EXTRACTION PHACO AND INTRAOCULAR LENS PLACEMENT ([place]);  Surgeon: Birder Robson, MD;  Location: [facility];  Service: Ophthalmology;  Laterality: Right;  [place] 44.1 AP% 16.5 CDE 7.30 Fluid Pack Lot [id number]   CATARACT EXTRACTION W/PHACO Left 10/19/2016   Procedure: CATARACT EXTRACTION PHACO AND INTRAOCULAR LENS PLACEMENT  (IOC);  Surgeon: Birder Robson, MD;  Location: [facility];  Service: Ophthalmology;  Laterality: Left;  [place] 53.7 AP% 19.5 CDE 10.45 Fluid pack lot # [id number]   CHOLECYSTECTOMY     COMBINED AUGMENTATION MAMMAPLASTY AND ABDOMINOPLASTY     JOINT REPLACEMENT Left 06/04/2013   left knee   KNEE ARTHROSCOPY Right 08/16/2016   Procedure: ARTHROSCOPY KNEE, tear posterior horn medial meniscus, tear anterior and posterior horns of lateral meniscus, chondromalacia of lateral compartment grade 3 patella and grade 4 medial;  Surgeon: Dereck Leep, MD;  Location: [facility];  Service: Orthopedics;  Laterality: Right;   LEFT HEART CATH AND CORONARY ANGIOGRAPHY Left 07/27/2019   Procedure: LEFT HEART CATH AND CORONARY ANGIOGRAPHY;  Surgeon: Wellington Hampshire, MD;  Location: [facility];  Service: Cardiovascular;  Laterality: Left;   MASTECTOMY  1986   nipple sparing mastectomy/Bilateral with silicone  breast implants, s/p saline replacements   Multiple orthopedic procedures     NOSE SURGERY     TEE WITHOUT CARDIOVERSION N/A 09/26/2017   Procedure: TRANSESOPHAGEAL ECHOCARDIOGRAM (TEE);  Surgeon: Fay Records, MD;  Location: [facility];  Service: Cardiovascular;  Laterality: N/A;   TOTAL KNEE ARTHROPLASTY Left      Current Outpatient Medications  Medication Sig Dispense Refill   acetaminophen (TYLENOL) 500 MG tablet Take 1,000 mg by mouth every 6 (six) hours as needed (pain).      aluminum-magnesium hydroxide 200-200 MG/5ML suspension Take 20 mLs by mouth every 6 (six) hours as needed for indigestion.      Carboxymethylcellul-Glycerin (LUBRICATING EYE DROPS OP) Place 1 drop into both eyes daily as needed (dry eyes).     cholecalciferol (VITAMIN D) 1000 units tablet Take 1,000 Units by mouth daily.     Coenzyme Q10 (COQ10) 200 MG CAPS Take 200 mg by mouth daily.     diltiazem (CARDIZEM CD) 180 MG 24 hr capsule TAKE 1 CAPSULE BY MOUTH EVERY EVENING (Patient taking differently: Take  180 mg by mouth every evening. ) 30 capsule 1   ELIQUIS 5 MG TABS tablet TAKE ONE TABLET BY MOUTH TWICE DAILY (Patient taking differently: Take 5 mg by mouth 2 (two) times daily. ) 60 tablet 5   metoprolol tartrate (LOPRESSOR) 50 MG tablet TAKE 1 TABLET BY MOUTH 3 TIMES DAILY 270 tablet 3   pantoprazole (PROTONIX) 40 MG tablet TAKE ONE TABLET BY MOUTH EVERY DAY (Patient taking differently: Take 40 mg by mouth daily. ) 30 tablet 6   pravastatin (PRAVACHOL) 20 MG tablet TAKE ONE TABLET BY MOUTH EVERY DAY (Patient taking differently: Take 20 mg by mouth every evening. ) 90 tablet 1   Probiotic Product (PROBIOTIC ADVANCED PO) Take 1 tablet by mouth daily.     sodium chloride (OCEAN) 0.65 % SOLN nasal spray Place 1 spray into both nostrils daily as needed for congestion.     No current facility-administered medications for this visit.     Allergies:  Iodine, Amiodarone, Biaxin [clarithromycin], Codeine, Digoxin and related, Enalapril, Fluocinonide, Iodinated diagnostic agents, Oxycodone, Promethazine, Tizanidine hcl, Warfarin and related, and Xarelto [rivaroxaban]    Social History:  The patient  reports that she has never smoked. She has never used smokeless tobacco. She reports that she does not drink alcohol or use drugs.   Family History:  The patient's family history includes Breast cancer in her maternal grandmother, sister, sister, and sister; Cancer in her father, maternal grandmother, sister, sister, and sister; Diabetes in her brother; Heart disease in her mother and son.    ROS:  Please see the history of present illness.   Otherwise, review of systems are positive for none.   All other systems are reviewed and negative.    PHYSICAL EXAM: VS:  BP 120/80 (BP Location: Left Arm, Patient Position: Sitting, Cuff Size: Normal)    Pulse 83    Ht 5' 4.5" (1.638 m)    Wt 162 lb 8 oz (73.7 kg)    SpO2 98%    BMI 27.46 kg/m  , BMI Body mass index is 27.46 kg/m. GEN: Well nourished, well  developed, in no acute distress  HEENT: Left temporal lobe tenderness Neck: no JVD, carotid bruits, or masses Cardiac: Irregularly irregular; no murmurs, rubs, or gallops,no edema  Respiratory:  clear to auscultation bilaterally, normal work of breathing GI: soft, nontender, nondistended, + BS MS: no deformity or atrophy  Skin: warm and dry, no rash Neuro:  Strength and sensation are intact Psych: euthymic mood, full affect Right radial pulses normal with no hematoma  EKG:  EKG is ordered today. The ekg ordered today demonstrates atrial fibrillation with right bundle branch block.  Ventricular rate is 83 bpm  Recent Labs: 02/12/2019: ALT 32 07/18/2019: BUN 17; Creatinine, Ser 0.87; Hemoglobin 13.7; Platelets 261; Potassium 5.1; Sodium 141    Lipid Panel    Component Value Date/Time   CHOL 147 07/22/2017 1332   TRIG 51 07/22/2017 1332   HDL 63 07/22/2017 1332   CHOLHDL 2.3 07/22/2017 1332   VLDL 10 07/22/2017 1332   LDLCALC 74 07/22/2017 1332      Wt Readings from Last 3 Encounters:  08/23/19 162 lb 8 oz (73.7 kg)  08/02/19 165 lb (74.8 kg)  07/27/19 166 lb 7.2 oz (75.5 kg)       ASSESSMENT AND PLAN:  1.  Permanent atrial fibrillation: Ventricular rate seems to be reasonably controlled  with diltiazem and metoprolol.  She is tolerating anticoagulation with Eliquis.   2. Essential hypertension: Blood pressure is well controlled on metoprolol and diltiazem.  3. History of tachycardia-induced cardiomyopathy. Most recent ejection fraction was 55%.  4. Left carotid calcifications: Carotid Doppler in August of 2018 showed mild nonobstructive disease.  5.  Recent atypical chest pain and abnormal stress test.  Cardiac catheterization showed no obstructive disease.  Continue medical therapy   Disposition:   FU with me in 6 months     Signed, [name], MD 08/23/19 [place], [place]

## 2019-08-24 ENCOUNTER — Telehealth: Payer: Self-pay | Admitting: Pharmacist

## 2019-08-24 ENCOUNTER — Other Ambulatory Visit: Payer: Self-pay | Admitting: Pharmacy Technician

## 2019-08-24 NOTE — Patient Outreach (Addendum)
[facility]) Care Management  08/24/2019  KAMORI FEESER Oct 16, 1936 [name]   Patient's case is being closed as she received a 30 day supply of Eliquis and communicated understanding on how to reorder to [employer]. Her case can be reopened at anytime in the future and patient has our contact information to reach out for pharmacy related questions or concerns.  Closure letters will be sent to the patient and PCP.  Elayne Guerin, PharmD, [place] Clinical Pharmacist [phone]

## 2019-08-24 NOTE — Patient Outreach (Signed)
[name] Care Management  08/24/2019  [name] [name] Jan 19, 1936 [name]   Successful outreach call placed to patient in regards to BMS application for Eliquis.  Spoke to patient, HIPAA identifiers verified.  Patient informed she had received a 30 days supply of Eliquis. Discussed refill procedure with patient. Informed patient to call when she has around a 2 week supply to left to allow for shipping and processing from the pharmacy and to avoid a delay in therapy. Patient was able to locate the phone number for the patient assistance pharmacy and verbalized understanding.   Patient informed she had no other questions or concerns relating to patient assistance or this medication at this time.. Confirmed patient had name and number.  Will route note to [name] that patient assistance has been completed and will remove myself from care team.  [name]. [name], [place] Management [phone]

## 2019-09-10 ENCOUNTER — Ambulatory Visit: Payer: Self-pay | Admitting: Pharmacist

## 2019-10-02 ENCOUNTER — Other Ambulatory Visit: Payer: Self-pay

## 2019-10-02 MED ORDER — DILTIAZEM HCL ER COATED BEADS 180 MG PO CP24: 180.0000 mg | ORAL_CAPSULE | Freq: Every evening | ORAL | 1 refills | Status: DC

## 2019-11-01 ENCOUNTER — Other Ambulatory Visit: Payer: Self-pay | Admitting: Pharmacy Technician

## 2019-11-01 NOTE — Patient Outreach (Signed)
[facility]) Care Management  11/01/2019  VAIL BASISTA May 26, 1936 [id number]   Incoming call received from patient in regards to BMS re enrollment for 2021.  Spoke to patient, HIPAA identifiers verified.  Patient informed she received re enrollment papers and wanted to know how to proceed. Informed patient to hold on to the papers because she would still need to meet the OOP requirement in 2021. In order to meet that requirement they take her spending starting 12/21/2019 until the date she reaches the 3%. Informed patient to outreach me again when she has met around $500-600 out of pocket and then she could re apply. Patient verbalized understanding and confirmed having name and number.  Catalyna Reilly P. Nahsir Venezia, [place] Management [phone]

## 2019-11-05 NOTE — Progress Notes (Deleted)
Pt is present for annual exam.  

## 2019-11-06 ENCOUNTER — Encounter: Payer: Medicare Other | Admitting: Obstetrics and Gynecology

## 2019-11-23 ENCOUNTER — Other Ambulatory Visit: Payer: Self-pay

## 2019-11-23 DIAGNOSIS — Z20822 Contact with and (suspected) exposure to covid-19: Secondary | ICD-10-CM

## 2019-11-24 LAB — NOVEL CORONAVIRUS, NAA: SARS-CoV-2, NAA: NOT DETECTED

## 2019-11-26 ENCOUNTER — Ambulatory Visit (INDEPENDENT_AMBULATORY_CARE_PROVIDER_SITE_OTHER): Payer: Medicare Other | Admitting: Internal Medicine

## 2019-11-26 ENCOUNTER — Encounter: Payer: Self-pay | Admitting: Internal Medicine

## 2019-11-26 ENCOUNTER — Other Ambulatory Visit: Payer: Self-pay

## 2019-11-26 VITALS — Ht 64.5 in | Wt 162.5 lb

## 2019-11-26 DIAGNOSIS — E7800 Pure hypercholesterolemia, unspecified: Secondary | ICD-10-CM

## 2019-11-26 DIAGNOSIS — R7301 Impaired fasting glucose: Secondary | ICD-10-CM | POA: Diagnosis not present

## 2019-11-26 NOTE — Progress Notes (Signed)
Telephone Note  This visit type was conducted due to national recommendations for restrictions regarding the COVID-19 pandemic (e.g. social distancing).  This format is felt to be most appropriate for this patient at this time.  All issues noted in this document were discussed and addressed.  No physical exam was performed (except for noted visual exam findings with Video Visits).   I connected with@ on 11/26/19 at 10:30 AM EST by telephone and verified that I am speaking with the correct person using two identifiers. Location patient: home Location provider: work or home office Persons participating in the virtual visit: patient, provider  I discussed the limitations, risks, security and privacy concerns of performing an evaluation and management service by telephone and the availability of in person appointments. I also discussed with the patient that there may be a patient responsible charge related to this service. The patient expressed understanding and agreed to proceed.  Reason for visit: follow up  HPI: 83 yr old female with hypertension , Depression with GAD  Chronic pain  Presents with concern for type 2 diabetes  Due to family history of same (3 siblings)   On May 30 random glucose was 125  And fasting was reportedly 125 fasting at nephrologist   Polyuria reported as nocturia up to 4 times per night ,  No dysuria  And some urinary urgency during the day.  Denies    polydipsia and polyphagia.  Weight has been intentionally lost in the neighborhood of   6 to 10 lbs buy eliminating sweets, but has gained a few back due to less activity since cold weather started .  Denies neuropathy   The patient has no signs or symptoms of COVID 19 infection (fever, cough, sore throat  or shortness of breath beyond what is typical for patient).  Patient denies contact with other persons with the above mentioned symptoms or with anyone confirmed to have COVID 19 . However her husband's daughter in law  died of COVID 71 INFECTION  Due to respiratory infection so both have been very apprehensive about making contact with family.  recent testing was negative    Lab Results  Component Value Date   HGBA1C 5.7 04/08/2017     ROS: See pertinent positives and negatives per HPI.  Past Medical History:  Diagnosis Date  . Arthritis   . Atrial flutter ([place]) 02/2011   s/p cardioversion   . Chest pain    a. H/o cardiac cath x 2, last 2012 -->nl cors;  b. 12/2016 MV: EF 61%, small region of mild perfusion defect in the apical anteroseptal region c/w breast attenuation, no ischemia-->Low risk; c. 06/2019 MV: Mod size, mild inflat ischemia, EF 59%. Cor and Ao Ca2+. Inflat defect more pronounced on this study compared to last.  . CKD (chronic kidney disease), stage III   . Cystocele   . GERD (gastroesophageal reflux disease)   . Headache(784.0)    chronic  . Hyperlipidemia   . Hypertension   . Knee fracture   . Mobitz type 2 second degree atrioventricular block    a. felt to be 2/2 amiodarone, resolved with decreased amiodarone dose.  Amio since d/c'd.  . Permanent atrial fibrillation ([place])    a. status post multiple DCCVs; b. 2018 - eval for PVI but opted for rate control.  [name] PONV (postoperative nausea and vomiting)    oxycodone and codiene cause N/V   . Pre-syncope    a. In setting of dehydration and AKI in the past.  .  Tachycardia induced cardiomyopathy ([place])    a. Resolved;  b. 08/2017 Echo: EF 50-55%, no rwma, mild MR, mildly to mod dil LA/RA; c. 02/2018 Echo: EF 55-60%, mild MR. Mildly dil LA. Nl RVSP. PASP 13mmHg.  [name] Venous insufficiency   . Vertigo     Past Surgical History:  Procedure Laterality Date  . ABDOMINAL HYSTERECTOMY  1990  . APPENDECTOMY    . AUGMENTATION MAMMAPLASTY Bilateral 1986   implants  . AUGMENTATION MAMMAPLASTY  1990  . AUGMENTATION MAMMAPLASTY  2011  . CARDIAC CATHETERIZATION    . CARDIOVERSION     x 3  . CARDIOVERSION    . CARDIOVERSION N/A 02/07/2017    Procedure: CARDIOVERSION;  Surgeon: Wellington Hampshire, MD;  Location: [facility];  Service: Cardiovascular;  Laterality: N/A;  . CARDIOVERSION N/A 07/22/2017   Procedure: Cardioversion;  Surgeon: Minna Merritts, MD;  Location: [facility];  Service: Cardiovascular;  Laterality: N/A;  . CATARACT EXTRACTION W/PHACO Right 09/21/2016   Procedure: CATARACT EXTRACTION PHACO AND INTRAOCULAR LENS PLACEMENT ([place]);  Surgeon: Birder Robson, MD;  Location: [facility];  Service: Ophthalmology;  Laterality: Right;  [place] 44.1 AP% 16.5 CDE 7.30 Fluid Pack Lot [id number]  . CATARACT EXTRACTION W/PHACO Left 10/19/2016   Procedure: CATARACT EXTRACTION PHACO AND INTRAOCULAR LENS PLACEMENT (I[facility]);  Surgeon: Birder Robson, MD;  Location: [facility];  Service: Ophthalmology;  Laterality: Left;  [place] 53.7 AP% 19.5 CDE 10.45 Fluid pack lot # [id number]  . CHOLECYSTECTOMY    . COMBINED AUGMENTATION MAMMAPLASTY AND ABDOMINOPLASTY    . JOINT REPLACEMENT Left 06/04/2013   left knee  . KNEE ARTHROSCOPY Right 08/16/2016   Procedure: ARTHROSCOPY KNEE, tear posterior horn medial meniscus, tear anterior and posterior horns of lateral meniscus, chondromalacia of lateral compartment grade 3 patella and grade 4 medial;  Surgeon: Dereck Leep, MD;  Location: [facility];  Service: Orthopedics;  Laterality: Right;  . LEFT HEART CATH AND CORONARY ANGIOGRAPHY Left 07/27/2019   Procedure: LEFT HEART CATH AND CORONARY ANGIOGRAPHY;  Surgeon: Wellington Hampshire, MD;  Location: [place] [facility];  Service: Cardiovascular;  Laterality: Left;  [name] MASTECTOMY  1986   nipple sparing mastectomy/Bilateral with silicone  breast implants, s/p saline replacements  . Multiple orthopedic procedures    . NOSE SURGERY    . TEE WITHOUT CARDIOVERSION N/A 09/26/2017   Procedure: TRANSESOPHAGEAL ECHOCARDIOGRAM (TEE);  Surgeon: Fay Records, MD;  Location: [facility];  Service: Cardiovascular;  Laterality: N/A;  . TOTAL KNEE ARTHROPLASTY Left     Family History   Problem Relation Age of Onset  . Heart disease Mother   . Cancer Father        stomach  . Heart disease Son        found at autopsy  . Cancer Sister        breast  . Breast cancer Sister   . Cancer Maternal Grandmother        breast  . Breast cancer Maternal Grandmother   . Cancer Sister        breast  . Breast cancer Sister   . Breast cancer Sister   . Cancer Sister        breast  . Diabetes Brother   . Ovarian cancer Neg Hx     SOCIAL HX:  reports that she has never smoked. She has never used smokeless tobacco. She reports that she does not drink alcohol or use drugs.   Current Outpatient Medications:  .  acetaminophen (TYLENOL) 500 MG  tablet, Take 1,000 mg by mouth every 6 (six) hours as needed (pain). , Disp: , Rfl:  .  aluminum-magnesium hydroxide 200-200 MG/5ML suspension, Take 20 mLs by mouth every 6 (six) hours as needed for indigestion. , Disp: , Rfl:  .  Carboxymethylcellul-Glycerin (LUBRICATING EYE DROPS OP), Place 1 drop into both eyes daily as needed (dry eyes)., Disp: , Rfl:  .  cholecalciferol (VITAMIN D) 1000 units tablet, Take 1,000 Units by mouth daily., Disp: , Rfl:  .  Coenzyme Q10 (COQ10) 200 MG CAPS, Take 200 mg by mouth daily., Disp: , Rfl:  .  diltiazem (CARDIZEM CD) 180 MG 24 hr capsule, Take 1 capsule (180 mg total) by mouth every evening., Disp: 90 capsule, Rfl: 1 .  ELIQUIS 5 MG TABS tablet, TAKE ONE TABLET BY MOUTH TWICE DAILY (Patient taking differently: Take 5 mg by mouth 2 (two) times daily. ), Disp: 60 tablet, Rfl: 5 .  metoprolol tartrate (LOPRESSOR) 50 MG tablet, TAKE 1 TABLET BY MOUTH 3 TIMES DAILY, Disp: 270 tablet, Rfl: 3 .  pantoprazole (PROTONIX) 40 MG tablet, TAKE ONE TABLET BY MOUTH EVERY DAY (Patient taking differently: Take 40 mg by mouth daily. ), Disp: 30 tablet, Rfl: 6 .  pravastatin (PRAVACHOL) 20 MG tablet, TAKE ONE TABLET BY MOUTH EVERY DAY (Patient taking differently: Take 20 mg by mouth every evening. ), Disp: 90 tablet, Rfl:  1 .  Probiotic Product (PROBIOTIC ADVANCED PO), Take 1 tablet by mouth daily., Disp: , Rfl:  .  sodium chloride (OCEAN) 0.65 % SOLN nasal spray, Place 1 spray into both nostrils daily as needed for congestion., Disp: , Rfl:   EXAM:   General impression: alert, cooperative and articulate.  No signs of being in distress  Lungs: speech is fluent sentence length suggests that patient is not short of breath and not punctuated by cough, sneezing or sniffing. [name]   Psych: affect normal.  speech is articulate and non pressured .  Denies suicidal thoughts  ASSESSMENT AND PLAN:  Discussed the following assessment and plan:  Impaired fasting glucose - Plan: Comprehensive metabolic panel, Hemoglobin A1c  Pure hypercholesterolemia - Plan: Lipid panel  Impaired fasting glucose a1c was ULN in 2018 and she recently had a fasting glucose of 125 at nephrologist's office.  Will repeat a1c and fasting glucose here.  Has had polyuria.  I have  recommended a low glycemic index diet utilizing smaller more frequent meals to increase metabolism.  I have also recommended that patient start exercising with a goal of 30 minutes of aerobic exercise a minimum of 5 days per week.    I discussed the assessment and treatment plan with the patient. The patient was provided an opportunity to ask questions and all were answered. The patient agreed with the plan and demonstrated an understanding of the instructions.   The patient was advised to call back or seek an in-person evaluation if the symptoms worsen or if the condition fails to improve as anticipated.  I provided  22 minutes of non-face-to-face time during this encounter reviewing patient's current problems and past procedures/imaging studies, providing counseling on the above mentioned problems , and coordination  of care .   [name], MD

## 2019-11-26 NOTE — Assessment & Plan Note (Addendum)
a1c was ULN in 2018 and she recently had a fasting glucose of 125 at nephrologist's office.  Will repeat a1c and fasting glucose here.  Has had polyuria.  I have  recommended a low glycemic index diet utilizing smaller more frequent meals to increase metabolism.  I have also recommended that patient start exercising with a goal of 30 minutes of aerobic exercise a minimum of 5 days per week.

## 2019-12-04 ENCOUNTER — Other Ambulatory Visit: Payer: Medicare Other

## 2019-12-04 ENCOUNTER — Other Ambulatory Visit: Payer: Self-pay

## 2019-12-04 ENCOUNTER — Telehealth: Payer: Self-pay | Admitting: *Deleted

## 2019-12-04 DIAGNOSIS — E7800 Pure hypercholesterolemia, unspecified: Secondary | ICD-10-CM

## 2019-12-04 DIAGNOSIS — R7301 Impaired fasting glucose: Secondary | ICD-10-CM

## 2019-12-04 NOTE — Addendum Note (Signed)
Addended by: Leeanne Rio on: 12/04/2019 10:41 AM   Modules accepted: Orders

## 2019-12-05 NOTE — Telephone Encounter (Signed)
Reordered labs since patient was not able to have labs drawn on 12/04/19 due to recent COVID testing.

## 2019-12-17 ENCOUNTER — Other Ambulatory Visit: Payer: Medicare Other

## 2019-12-20 ENCOUNTER — Other Ambulatory Visit: Payer: Self-pay

## 2019-12-20 ENCOUNTER — Other Ambulatory Visit (INDEPENDENT_AMBULATORY_CARE_PROVIDER_SITE_OTHER): Payer: Medicare Other

## 2019-12-20 DIAGNOSIS — E7800 Pure hypercholesterolemia, unspecified: Secondary | ICD-10-CM | POA: Diagnosis not present

## 2019-12-20 DIAGNOSIS — R7301 Impaired fasting glucose: Secondary | ICD-10-CM

## 2019-12-20 LAB — HEMOGLOBIN A1C: Hgb A1c MFr Bld: 6.4 % (ref 4.6–6.5)

## 2019-12-20 LAB — COMPREHENSIVE METABOLIC PANEL
ALT: 19 U/L (ref 0–35)
AST: 21 U/L (ref 0–37)
Albumin: 4.3 g/dL (ref 3.5–5.2)
Alkaline Phosphatase: 71 U/L (ref 39–117)
BUN: 18 mg/dL (ref 6–23)
CO2: 27 mEq/L (ref 19–32)
Calcium: 9.5 mg/dL (ref 8.4–10.5)
Chloride: 100 mEq/L (ref 96–112)
Creatinine, Ser: 0.81 mg/dL (ref 0.40–1.20)
GFR: 67.71 mL/min (ref >60.00)
Glucose, Bld: 112 mg/dL — ABNORMAL HIGH (ref 70–99)
Potassium: 4.4 mEq/L (ref 3.5–5.1)
Sodium: 136 mEq/L (ref 135–145)
Total Bilirubin: 1.3 mg/dL — ABNORMAL HIGH (ref 0.2–1.2)
Total Protein: 7 g/dL (ref 6.0–8.3)

## 2019-12-20 LAB — LIPID PANEL
Cholesterol: 153 mg/dL (ref 0–200)
HDL: 59.9 mg/dL (ref >39.00)
LDL Cholesterol: 77 mg/dL (ref 0–99)
NonHDL: 93.06
Total CHOL/HDL Ratio: 3
Triglycerides: 80 mg/dL (ref 0.0–149.0)
VLDL: 16 mg/dL (ref 0.0–40.0)

## 2019-12-31 ENCOUNTER — Encounter: Payer: Self-pay | Admitting: Internal Medicine

## 2019-12-31 ENCOUNTER — Other Ambulatory Visit: Payer: Self-pay

## 2019-12-31 ENCOUNTER — Ambulatory Visit (INDEPENDENT_AMBULATORY_CARE_PROVIDER_SITE_OTHER): Payer: Medicare Other | Admitting: Internal Medicine

## 2019-12-31 DIAGNOSIS — R7301 Impaired fasting glucose: Secondary | ICD-10-CM

## 2019-12-31 DIAGNOSIS — Z803 Family history of malignant neoplasm of breast: Secondary | ICD-10-CM | POA: Diagnosis not present

## 2019-12-31 DIAGNOSIS — R7303 Prediabetes: Secondary | ICD-10-CM

## 2019-12-31 DIAGNOSIS — K2100 Gastro-esophageal reflux disease with esophagitis, without bleeding: Secondary | ICD-10-CM

## 2019-12-31 DIAGNOSIS — E7800 Pure hypercholesterolemia, unspecified: Secondary | ICD-10-CM | POA: Diagnosis not present

## 2019-12-31 DIAGNOSIS — E119 Type 2 diabetes mellitus without complications: Secondary | ICD-10-CM | POA: Insufficient documentation

## 2019-12-31 NOTE — Progress Notes (Signed)
Telephone  Note  This visit type was conducted due to national recommendations for restrictions regarding the COVID-19 pandemic (e.g. social distancing).  This format is felt to be most appropriate for this patient at this time.  All issues noted in this document were discussed and addressed.  No physical exam was performed (except for noted visual exam findings with Video Visits).   I connected with@ on 12/31/19 at  8:30 AM EST by telephone and verified that I am speaking with the correct person using two identifiers. Location patient: home Location provider: work or home office Persons participating in the virtual visit: patient, provider  I discussed the limitations, risks, security and privacy concerns of performing an evaluation and management service by telephone and the availability of in person appointments. I also discussed with the patient that there may be a patient responsible charge related to this service. The patient expressed understanding and agreed to proceed.  Reason for visit: follow up on recent labs indicated IPG   HPI:  84 yr old female with   History of obesity  s/p bariatric surgery, hypertension and hyperlipiemia presents for discussion of prediabetes diagnosed by a1c.  Had a stressful holiday,  Last sibling died of esophageal  cancer  Recently has now had all 3 siblings die of CA). Did not get together with children due to [place].   Diet and lifestyle reviewed.  She has not been exercising . Has a treadmill and exercise bike but not using it. Breakfast tends to be hi carb.  Has 3-4 servings of starch daily.   Questions about COVID 19 vaccine answered.     ROS: See pertinent positives and negatives per HPI.  Past Medical History:  Diagnosis Date  . Arthritis   . Atrial flutter ([place]) 02/2011   s/p cardioversion   . Chest pain    a. H/o cardiac cath x 2, last 2012 -->nl cors;  b. 12/2016 MV: EF 61%, small region of mild perfusion defect in the apical  anteroseptal region c/w breast attenuation, no ischemia-->Low risk; c. 06/2019 MV: Mod size, mild inflat ischemia, EF 59%. Cor and Ao Ca2+. Inflat defect more pronounced on this study compared to last.  . CKD (chronic kidney disease), stage III   . Cystocele   . GERD (gastroesophageal reflux disease)   . Headache(784.0)    chronic  . Hyperlipidemia   . Hypertension   . Knee fracture   . Mobitz type 2 second degree atrioventricular block    a. felt to be 2/2 amiodarone, resolved with decreased amiodarone dose.  Amio since d/c'd.  . Permanent atrial fibrillation ([place])    a. status post multiple DCCVs; b. 2018 - eval for PVI but opted for rate control.  [name] PONV (postoperative nausea and vomiting)    oxycodone and codiene cause N/V   . Pre-syncope    a. In setting of dehydration and AKI in the past.  . Tachycardia induced cardiomyopathy ([place])    a. Resolved;  b. 08/2017 Echo: EF 50-55%, no rwma, mild MR, mildly to mod dil LA/RA; c. 02/2018 Echo: EF 55-60%, mild MR. Mildly dil LA. Nl RVSP. PASP 67mmHg.  [name] Venous insufficiency   . Vertigo     Past Surgical History:  Procedure Laterality Date  . ABDOMINAL HYSTERECTOMY  1990  . APPENDECTOMY    . AUGMENTATION MAMMAPLASTY Bilateral 1986   implants  . AUGMENTATION MAMMAPLASTY  1990  . AUGMENTATION MAMMAPLASTY  2011  . CARDIAC CATHETERIZATION    .  CARDIOVERSION     x 3  . CARDIOVERSION    . CARDIOVERSION N/A 02/07/2017   Procedure: CARDIOVERSION;  Surgeon: Wellington Hampshire, MD;  Location: [facility];  Service: Cardiovascular;  Laterality: N/A;  . CARDIOVERSION N/A 07/22/2017   Procedure: Cardioversion;  Surgeon: Minna Merritts, MD;  Location: [facility];  Service: Cardiovascular;  Laterality: N/A;  . CATARACT EXTRACTION W/PHACO Right 09/21/2016   Procedure: CATARACT EXTRACTION PHACO AND INTRAOCULAR LENS PLACEMENT ([place]);  Surgeon: Birder Robson, MD;  Location: [facility];  Service: Ophthalmology;  Laterality: Right;  [place] 44.1 AP% 16.5 CDE  7.30 Fluid Pack Lot [id number]  . CATARACT EXTRACTION W/PHACO Left 10/19/2016   Procedure: CATARACT EXTRACTION PHACO AND INTRAOCULAR LENS PLACEMENT ([facility]);  Surgeon: Birder Robson, MD;  Location: [facility];  Service: Ophthalmology;  Laterality: Left;  [place] 53.7 AP% 19.5 CDE 10.45 Fluid pack lot # [id number]  . CHOLECYSTECTOMY    . COMBINED AUGMENTATION MAMMAPLASTY AND ABDOMINOPLASTY    . JOINT REPLACEMENT Left 06/04/2013   left knee  . KNEE ARTHROSCOPY Right 08/16/2016   Procedure: ARTHROSCOPY KNEE, tear posterior horn medial meniscus, tear anterior and posterior horns of lateral meniscus, chondromalacia of lateral compartment grade 3 patella and grade 4 medial;  Surgeon: Dereck Leep, MD;  Location: [facility];  Service: Orthopedics;  Laterality: Right;  . LEFT HEART CATH AND CORONARY ANGIOGRAPHY Left 07/27/2019   Procedure: LEFT HEART CATH AND CORONARY ANGIOGRAPHY;  Surgeon: Wellington Hampshire, MD;  Location: [facility];  Service: Cardiovascular;  Laterality: Left;  [name] MASTECTOMY  1986   nipple sparing mastectomy/Bilateral with silicone  breast implants, s/p saline replacements  . Multiple orthopedic procedures    . NOSE SURGERY    . TEE WITHOUT CARDIOVERSION N/A 09/26/2017   Procedure: TRANSESOPHAGEAL ECHOCARDIOGRAM (TEE);  Surgeon: Fay Records, MD;  Location[facility];  Service: Cardiovascular;  Laterality: N/A;  . TOTAL KNEE ARTHROPLASTY Left     Family History  Problem Relation Age of Onset  . Heart disease Mother   . Cancer Father        stomach  . Heart disease Son        found at autopsy  . Cancer Sister        breast  . Breast cancer Sister   . Cancer Maternal Grandmother        breast  . Breast cancer Maternal Grandmother   . Cancer Sister        breast  . Breast cancer Sister   . Diabetes Brother   . Esophageal cancer Brother   . Ovarian cancer Neg Hx     SOCIAL HX:  reports that she has never smoked. She has never used smokeless tobacco. She reports  that she does not drink alcohol or use drugs.   Current Outpatient Medications:  .  acetaminophen (TYLENOL) 500 MG tablet, Take 1,000 mg by mouth every 6 (six) hours as needed (pain). , Disp: , Rfl:  .  aluminum-magnesium hydroxide 200-200 MG/5ML suspension, Take 20 mLs by mouth every 6 (six) hours as needed for indigestion. , Disp: , Rfl:  .  Carboxymethylcellul-Glycerin (LUBRICATING EYE DROPS OP), Place 1 drop into both eyes daily as needed (dry eyes)., Disp: , Rfl:  .  cholecalciferol (VITAMIN D) 1000 units tablet, Take 1,000 Units by mouth daily., Disp: , Rfl:  .  Coenzyme Q10 (COQ10) 200 MG CAPS, Take 200 mg by mouth daily., Disp: , Rfl:  .  diltiazem (CARDIZEM CD) 180 MG 24  hr capsule, Take 1 capsule (180 mg total) by mouth every evening., Disp: 90 capsule, Rfl: 1 .  ELIQUIS 5 MG TABS tablet, TAKE ONE TABLET BY MOUTH TWICE DAILY (Patient taking differently: Take 5 mg by mouth 2 (two) times daily. ), Disp: 60 tablet, Rfl: 5 .  metoprolol tartrate (LOPRESSOR) 50 MG tablet, TAKE 1 TABLET BY MOUTH 3 TIMES DAILY, Disp: 270 tablet, Rfl: 3 .  pantoprazole (PROTONIX) 40 MG tablet, TAKE ONE TABLET BY MOUTH EVERY DAY (Patient taking differently: Take 40 mg by mouth daily. ), Disp: 30 tablet, Rfl: 6 .  pravastatin (PRAVACHOL) 20 MG tablet, TAKE ONE TABLET BY MOUTH EVERY DAY (Patient taking differently: Take 20 mg by mouth every evening. ), Disp: 90 tablet, Rfl: 1 .  Probiotic Product (PROBIOTIC ADVANCED PO), Take 1 tablet by mouth daily., Disp: , Rfl:  .  sodium chloride (OCEAN) 0.65 % SOLN nasal spray, Place 1 spray into both nostrils daily as needed for congestion., Disp: , Rfl:   EXAM:  VITALS per patient if applicable:  GENERAL: alert, oriented, appears well and in no acute distress  HEENT: atraumatic, conjunttiva clear, no obvious abnormalities on inspection of external nose and ears  NECK: normal movements of the head and neck  LUNGS: on inspection no signs of respiratory distress,  breathing rate appears normal, no obvious gross SOB, gasping or wheezing  CV: no obvious cyanosis  MS: moves all visible extremities without noticeable abnormality  PSYCH/NEURO: pleasant and cooperative, no obvious depression or anxiety, speech and thought processing grossly intact  ASSESSMENT AND PLAN:  Discussed the following assessment and plan:  Prediabetes  Family history of breast cancer in sister  Gastroesophageal reflux disease with esophagitis without hemorrhage  Pure hypercholesterolemia  Impaired fasting glucose  Prediabetes Fasting glucose of 112 and a1c has risen from 5.7 to 6.4 over the last year.  Addressed sedentary lifestyle and diet today.  Repeat a1c in 6 months   Lab Results  Component Value Date   HGBA1C 6.4 12/20/2019     GERD (gastroesophageal reflux disease) Continue PPI,  Her brother died of esophageal cancer recently  Hyperlipidemia Tolerating  Pravastatin. LDL and triglycerides are at goal on current medications. sHe has no side effects and liver enzymes are normal. No changes today   Lab Results  Component Value Date   CHOL 153 12/20/2019   HDL 59.90 12/20/2019   LDLCALC 77 12/20/2019   TRIG 80.0 12/20/2019   CHOLHDL 3 12/20/2019   Lab Results  Component Value Date   ALT 19 12/20/2019   AST 21 12/20/2019   ALKPHOS 71 12/20/2019   BILITOT 1.3 (H) 12/20/2019     Impaired fasting glucose a1c has risen fro m5.7 in 2018 to 6.4   Fasting glucose was 112,  But she reportedly  had a fasting glucose of 125 at nephrologist's office.    I have  recommended a low glycemic index diet utilizing smaller more frequent meals to increase metabolism.  I have also recommended that patient start exercising with a goal of 30 minutes of aerobic exercise a minimum of 5 days per week.    I discussed the assessment and treatment plan with the patient. The patient was provided an opportunity to ask questions and all were answered. The patient agreed with  the plan and demonstrated an understanding of the instructions.   The patient was advised to call back or seek an in-person evaluation if the symptoms worsen or if the condition fails to improve as anticipated.  I provided  22 minutes of non-face-to-face time during this encounter reviewing patient's current problems and past procedures/imaging studies, providing counseling on the above mentioned problems , and coordination  of care . [name], MD

## 2019-12-31 NOTE — Assessment & Plan Note (Signed)
Fasting glucose of 112 and a1c has risen from 5.7 to 6.4 over the last year.  Addressed sedentary lifestyle and diet today.  Repeat a1c in 6 months   Lab Results  Component Value Date   HGBA1C 6.4 12/20/2019

## 2019-12-31 NOTE — Assessment & Plan Note (Signed)
a1c has risen fro m5.7 in 2018 to 6.4   Fasting glucose was 112,  But she reportedly  had a fasting glucose of 125 at nephrologist's office.    I have  recommended a low glycemic index diet utilizing smaller more frequent meals to increase metabolism.  I have also recommended that patient start exercising with a goal of 30 minutes of aerobic exercise a minimum of 5 days per week.

## 2019-12-31 NOTE — Assessment & Plan Note (Signed)
Tolerating  Pravastatin. LDL and triglycerides are at goal on current medications. sHe has no side effects and liver enzymes are normal. No changes today   Lab Results  Component Value Date   CHOL 153 12/20/2019   HDL 59.90 12/20/2019   LDLCALC 77 12/20/2019   TRIG 80.0 12/20/2019   CHOLHDL 3 12/20/2019   Lab Results  Component Value Date   ALT 19 12/20/2019   AST 21 12/20/2019   ALKPHOS 71 12/20/2019   BILITOT 1.3 (H) 12/20/2019

## 2019-12-31 NOTE — Assessment & Plan Note (Signed)
Continue PPI,  Her brother died of esophageal cancer recently

## 2020-01-02 ENCOUNTER — Telehealth: Payer: Self-pay | Admitting: Cardiovascular Disease

## 2020-01-02 ENCOUNTER — Encounter: Payer: Self-pay | Admitting: Nurse Practitioner

## 2020-01-02 ENCOUNTER — Ambulatory Visit (INDEPENDENT_AMBULATORY_CARE_PROVIDER_SITE_OTHER): Payer: Medicare Other | Admitting: Nurse Practitioner

## 2020-01-02 ENCOUNTER — Other Ambulatory Visit: Payer: Self-pay

## 2020-01-02 VITALS — BP 140/70 | HR 87 | Ht 63.5 in | Wt 163.0 lb

## 2020-01-02 DIAGNOSIS — E782 Mixed hyperlipidemia: Secondary | ICD-10-CM | POA: Diagnosis not present

## 2020-01-02 DIAGNOSIS — I4821 Permanent atrial fibrillation: Secondary | ICD-10-CM

## 2020-01-02 DIAGNOSIS — I10 Essential (primary) hypertension: Secondary | ICD-10-CM

## 2020-01-02 MED ORDER — DILTIAZEM HCL ER COATED BEADS 240 MG PO CP24: 240.0000 mg | ORAL_CAPSULE | Freq: Every day | ORAL | 3 refills | Status: DC

## 2020-01-02 NOTE — Patient Instructions (Signed)
Medication Instructions:  Increase your Diltiazem to 240 mg daily.  *If you need a refill on your cardiac medications before your next appointment, please call your pharmacy*  Lab Work: None ordered.   If you have labs (blood work) drawn today and your tests are completely normal, you will receive your results only by: [name] MyChart Message (if you have MyChart) OR . A paper copy in the mail If you have any lab test that is abnormal or we need to change your treatment, we will call you to review the results.  Testing/Procedures: None ordered.  Follow-Up: At [facility], you and your health needs are our priority.  As part of our continuing mission to provide you with exceptional heart care, we have created designated Provider Care Teams.  These Care Teams include your primary Cardiologist (physician) and Advanced Practice Providers (APPs -  Physician Assistants and Nurse Practitioners) who all work together to provide you with the care you need, when you need it.  Your next appointment:  04/03/20 at 11:20 with Dr. Fletcher Anon    Other Instructions NA

## 2020-01-02 NOTE — Telephone Encounter (Signed)
Pt c/o BP issue: STAT if pt c/o blurred vision, one-sided weakness or slurred speech  1. What are your last 5 BP readings?  142/87  100   1/13 this morning 140/83   2. Are you having any other symptoms (ex. Dizziness, headache, blurred vision, passed out)? Lips numb, little dizzy  3. What is your BP issue? High bp   Patient is scheduled at 3:35 with Sharolyn Douglas but would like sooner if possible

## 2020-01-02 NOTE — Progress Notes (Signed)
Office Visit    Patient Name: [name] [name] Date of Encounter: 01/02/2020  Primary Care Provider:  [name], MD  Primary Cardiologist:  [name], MD  Chief Complaint    84 year old female with a history of permanent atrial fibrillation and flutter, hypertension, hyperlipidemia, remote tachycardia induced cardiomyopathy, and chest pain status post catheterization August 2020 revealing only minor irregularities, who presents for follow-up related to hypertension.  Past Medical History    Past Medical History:  Diagnosis Date   Arthritis    Atrial flutter ([place]) 02/2011   s/p cardioversion    Chest pain    a. H/o cardiac cath x 2-> 2012 -->nl cors;  b. 12/2016 MV: EF 61%, small region of mild perfusion defect in the apical anteroseptal region c/w breast attenuation, no ischemia-->Low risk; c. 06/2019 MV: Mod size, mild inflat ischemia, EF 59%. Cor and Ao Ca2+. Inflat defect more pronounced on this study compared to last; c. 07/2019 Cath: LM nl, LAD min irregs, LCX nl, OM1/2/3 nl, RCA min irregs.   CKD (chronic kidney disease), stage III    Cystocele    GERD (gastroesophageal reflux disease)    Headache(784.0)    chronic   Hyperlipidemia    Hypertension    Knee fracture    Mobitz type 2 second degree atrioventricular block    a. felt to be 2/2 amiodarone, resolved with decreased amiodarone dose.  Amio since d/c'd.   Permanent atrial fibrillation (HCC)    a. status post multiple DCCVs; b. 2018 - eval for PVI but opted for rate control.   PONV (postoperative nausea and vomiting)    oxycodone and codiene cause N/V    Pre-syncope    a. In setting of dehydration and AKI in the past.   Tachycardia induced cardiomyopathy ([place])    a. Resolved;  b. 08/2017 Echo: EF 50-55%, no rwma, mild MR, mildly to mod dil LA/RA; c. 02/2018 Echo: EF 55-60%, mild MR. Mildly dil LA. Nl RVSP. PASP 52mmHg.   Venous insufficiency    Vertigo    Past Surgical History:  Procedure  Laterality Date   ABDOMINAL HYSTERECTOMY  1990   APPENDECTOMY     AUGMENTATION MAMMAPLASTY Bilateral 1986   implants   AUGMENTATION MAMMAPLASTY  1990   AUGMENTATION MAMMAPLASTY  2011   CARDIAC CATHETERIZATION     CARDIOVERSION     x 3   CARDIOVERSION     CARDIOVERSION N/A 02/07/2017   Procedure: CARDIOVERSION;  Surgeon: Wellington Hampshire, MD;  Location: [facility];  Service: Cardiovascular;  Laterality: N/A;   CARDIOVERSION N/A 07/22/2017   Procedure: Cardioversion;  Surgeon: Minna Merritts, MD;  Location: [facility];  Service: Cardiovascular;  Laterality: N/A;   CATARACT EXTRACTION W/PHACO Right 09/21/2016   Procedure: CATARACT EXTRACTION PHACO AND INTRAOCULAR LENS PLACEMENT ([place]);  Surgeon: Birder Robson, MD;  Location: [facility];  Service: Ophthalmology;  Laterality: Right;  [place] 44.1 AP% 16.5 CDE 7.30 Fluid Pack Lot [id number]   CATARACT EXTRACTION W/PHACO Left 10/19/2016   Procedure: CATARACT EXTRACTION PHACO AND INTRAOCULAR LENS PLACEMENT ([facility]);  Surgeon: Birder Robson, MD;  Location: [facility];  Service: Ophthalmology;  Laterality: Left;  [place] 53.7 AP% 19.5 CDE 10.45 Fluid pack lot # [id number]   CHOLECYSTECTOMY     COMBINED AUGMENTATION MAMMAPLASTY AND ABDOMINOPLASTY     JOINT REPLACEMENT Left 06/04/2013   left knee   KNEE ARTHROSCOPY Right 08/16/2016   Procedure: ARTHROSCOPY KNEE, tear posterior horn medial meniscus, tear anterior and posterior horns of  lateral meniscus, chondromalacia of lateral compartment grade 3 patella and grade 4 medial;  Surgeon: Dereck Leep, MD;  Location: [facility];  Service: Orthopedics;  Laterality: Right;   LEFT HEART CATH AND CORONARY ANGIOGRAPHY Left 07/27/2019   Procedure: LEFT HEART CATH AND CORONARY ANGIOGRAPHY;  Surgeon: Wellington Hampshire, MD;  Location: [facility];  Service: Cardiovascular;  Laterality: Left;   MASTECTOMY  1986   nipple sparing mastectomy/Bilateral with silicone  breast implants, s/p saline replacements     Multiple orthopedic procedures     NOSE SURGERY     TEE WITHOUT CARDIOVERSION N/A 09/26/2017   Procedure: TRANSESOPHAGEAL ECHOCARDIOGRAM (TEE);  Surgeon: Fay Records, MD;  Location: [facility];  Service: Cardiovascular;  Laterality: N/A;   TOTAL KNEE ARTHROPLASTY Left     Allergies  Allergies  Allergen Reactions   Iodine Anaphylaxis    NO PROBLEMS WITH BETADINE   Amiodarone     Cannot remember    Biaxin [Clarithromycin] Nausea And Vomiting   Codeine Nausea And Vomiting   Digoxin And Related Other (See Comments)    Fatigue, eye puffiness, hoarsness   Enalapril Other (See Comments)    unknown   Fluocinonide Other (See Comments)    Tingling sensation in head and redness to scalp.   Iodinated Diagnostic Agents Other (See Comments)    Tachycardia   Oxycodone Nausea And Vomiting   Promethazine Other (See Comments)    Unknown    Tizanidine Hcl Other (See Comments)    hypotension    Warfarin And Related     Bleeding    Xarelto [Rivaroxaban] Other (See Comments)    bleeding    History of Present Illness    84 year old female with above complex past medical history including permanent atrial fibrillation along with prior history of atrial flutter status post multiple cardioversions in the past (chronic Eliquis).  She also has a history of tachycardia induced cardiomyopathy though this is resolved.  Other history includes hypertension, hyperlipidemia, and chest pain with minimal, nonobstructive CAD noted on catheterization in 2012 and again in August 2020 following an abnormal stress test.  She was last seen in clinic in September 2020, at which time she was doing well.  Over the past week or more, she has noted that her bp has been trending in the 140's, which is high for her.  She had previously reduced her metoprolol dose from 50 3 times daily to 50 twice daily but in the setting of elevated pressures, 2 days ago she increased back to 50 3 times daily.  She  has not had c/p or dyspnea, but was feeling somewhat 'off' 2 nights ago, and thought that perhaps her lips had swollen a little bit.  This has since resolved.  She contacted our office as her bp's were in the upper 140's and requested to be seen.  Her husband notes that she hasn't been sleeping well and is often up early, busy in the morning, but then frequently naps during the day.  She says that this far into pandemic living, and rarely leaving her house, she is finding herself bored, and so it's been rather easy to doze off while reading or watching television.  She denies palpitations, pnd, orthopnea, n, v, dizziness, syncope, edema, weight gain, or early satiety.   Home Medications    Prior to Admission medications   Medication Sig Start Date End Date Taking? Authorizing Provider  acetaminophen (TYLENOL) 500 MG tablet Take 1,000 mg by mouth every 6 (  six) hours as needed (pain).     [provider]  aluminum-magnesium hydroxide 200-200 MG/5ML suspension Take 20 mLs by mouth every 6 (six) hours as needed for indigestion.     [provider]  Carboxymethylcellul-Glycerin (LUBRICATING EYE DROPS OP) Place 1 drop into both eyes daily as needed (dry eyes).    [provider]  cholecalciferol (VITAMIN D) 1000 units tablet Take 1,000 Units by mouth daily.    [provider]  Coenzyme Q10 (COQ10) 200 MG CAPS Take 200 mg by mouth daily.    [provider]  diltiazem (CARDIZEM CD) 180 MG 24 hr capsule Take 1 capsule (180 mg total) by mouth every evening. 10/02/19   Wellington Hampshire, MD  ELIQUIS 5 MG TABS tablet TAKE ONE TABLET BY MOUTH TWICE DAILY Patient taking differently: Take 5 mg by mouth 2 (two) times daily.  05/08/19   Wellington Hampshire, MD  metoprolol tartrate (LOPRESSOR) 50 MG tablet TAKE 1 TABLET BY MOUTH 3 TIMES DAILY 08/06/19   Wellington Hampshire, MD  pantoprazole (PROTONIX) 40 MG tablet TAKE ONE TABLET BY MOUTH EVERY DAY Patient taking differently:  Take 40 mg by mouth daily.  06/04/19   Wellington Hampshire, MD  pravastatin (PRAVACHOL) 20 MG tablet TAKE ONE TABLET BY MOUTH EVERY DAY Patient taking differently: Take 20 mg by mouth every evening.  05/08/19   [name], MD  Probiotic Product (PROBIOTIC ADVANCED PO) Take 1 tablet by mouth daily.    [provider]  sodium chloride (OCEAN) 0.65 % SOLN nasal spray Place 1 spray into both nostrils daily as needed for congestion.    [provider]    Review of Systems    She denies chest pain, palpitations, dyspnea, pnd, orthopnea, n, v, dizziness, syncope, edema, weight gain, or early satiety.  She hadn't felt quite right for the past day or two and wanted to be sure that her ECG was ok.  All other systems reviewed and are otherwise negative except as noted above.  Physical Exam    VS:  BP 140/70 (BP Location: Left Arm, Patient Position: Sitting, Cuff Size: Normal)    Pulse 87    Ht 5' 3.5" (1.613 m)    Wt 163 lb (73.9 kg)    SpO2 95%    BMI 28.42 kg/m  , BMI Body mass index is 28.42 kg/m. GEN: Well nourished, well developed, in no acute distress. HEENT: normal. Neck: Supple, no JVD, carotid bruits, or masses. Cardiac: IR, IR, no murmurs, rubs, or gallops. No clubbing, cyanosis, edema.  Radials/DP/PT 2+ and equal bilaterally.  Respiratory:  Respirations regular and unlabored, clear to auscultation bilaterally. GI: Soft, nontender, nondistended, BS + x 4. MS: no deformity or atrophy. Skin: warm and dry, no rash. Neuro:  Strength and sensation are intact. Psych: Normal affect.  Accessory Clinical Findings    ECG personally reviewed by me today -atrial fibrillation, 87, right bundle branch block, inferolateral T wave inversion - no acute changes.  Lab Results  Component Value Date   WBC 8.1 07/18/2019   HGB 13.7 07/18/2019   HCT 41.4 07/18/2019   MCV 95 07/18/2019   PLT 261 07/18/2019   Lab Results  Component Value Date   CREATININE 0.81 12/20/2019   BUN 18  12/20/2019   NA 136 12/20/2019   K 4.4 12/20/2019   CL 100 12/20/2019   CO2 27 12/20/2019   Lab Results  Component Value Date   ALT 19 12/20/2019  AST 21 12/20/2019   ALKPHOS 71 12/20/2019   BILITOT 1.3 (H) 12/20/2019   Lab Results  Component Value Date   CHOL 153 12/20/2019   HDL 59.90 12/20/2019   LDLCALC 77 12/20/2019   TRIG 80.0 12/20/2019   CHOLHDL 3 12/20/2019    Lab Results  Component Value Date   HGBA1C 6.4 12/20/2019    Assessment & Plan    1.  Essential hypertension: Blood pressure has been trending higher over the past week or more, now typically in the 140s.  She is 140/70 today.  Heart rate is 87.  With blood pressure being elevated, she says she has felt a little bit off.  No chest pain or dyspnea.  I am going to increase diltiazem to 240 mg daily.  She otherwise remains on metoprolol 50 mg 3 times daily.  I advised that if blood pressure is stabilized on the higher dose of diltiazem, she is welcome to drop metoprolol back down to twice daily.  2.  Permanent atrial fibrillation: Heart rate 87 today.  Continue beta-blocker and Eliquis.  Adjusting diltiazem as above.  3.  Hyperlipidemia: LDL 77 on Pravachol therapy.  4.  History of chest pain: Minimal nonobstructive CAD on catheterization in August 2020.  No chest pain.  Continue statin and beta-blocker therapy.  No aspirin in the setting of Eliquis.  5.  History of tachycardia induced cardiomyopathy: EF 55-6% by echo in 2019 and 59% by SPECT in July 2020.  Euvolemic on exam.  6.  Disposition: Patient will continue to follow blood pressure closely at home.  She has follow-up arranged with Dr. Fletcher Anon in April.   Murray Hodgkins, NP 01/02/2020, 4:27 PM

## 2020-01-08 DIAGNOSIS — H60332 Swimmer's ear, left ear: Secondary | ICD-10-CM | POA: Diagnosis not present

## 2020-01-09 ENCOUNTER — Other Ambulatory Visit: Payer: Self-pay

## 2020-01-09 ENCOUNTER — Ambulatory Visit
Admission: EM | Admit: 2020-01-09 | Discharge: 2020-01-09 | Disposition: A | Discharge status: Home / Self Care | Payer: Medicare Other | Attending: Emergency Medicine | Admitting: Emergency Medicine

## 2020-01-09 DIAGNOSIS — Z20822 Contact with and (suspected) exposure to covid-19: Secondary | ICD-10-CM | POA: Diagnosis not present

## 2020-01-09 DIAGNOSIS — Z0189 Encounter for other specified special examinations: Secondary | ICD-10-CM | POA: Diagnosis not present

## 2020-01-09 NOTE — ED Provider Notes (Signed)
Roderic Palau    CSN: [id number] Arrival date & time: 01/09/20  1328      History   Chief Complaint Chief Complaint  Patient presents with  . Covid Testing    HPI [name] [name] is a 84 y.o. female.   Reports that she is afraid that she has been exposed to [place] at church, requesting COVID testing today. Denies fever, cough, headache, rash, n/v/d, congestion. Reports that she has A fib and is currently being managed by cardiology. Reports that she is being treated for L OM with antibiotic ear drops. Reports that these are helping, denies ear pain today.  The history is provided by the patient.    Past Medical History:  Diagnosis Date  . Arthritis   . Atrial flutter ([place]) 02/2011   s/p cardioversion   . Chest pain    a. H/o cardiac cath x 2-> 2012 -->nl cors;  b. 12/2016 MV: EF 61%, small region of mild perfusion defect in the apical anteroseptal region c/w breast attenuation, no ischemia-->Low risk; c. 06/2019 MV: Mod size, mild inflat ischemia, EF 59%. Cor and Ao Ca2+. Inflat defect more pronounced on this study compared to last; c. 07/2019 Cath: LM nl, LAD min irregs, LCX nl, OM1/2/3 nl, RCA min irregs.  . CKD (chronic kidney disease), stage III   . Cystocele   . GERD (gastroesophageal reflux disease)   . Headache(784.0)    chronic  . Hyperlipidemia   . Hypertension   . Knee fracture   . Mobitz type 2 second degree atrioventricular block    a. felt to be 2/2 amiodarone, resolved with decreased amiodarone dose.  Amio since d/c'd.  . Permanent atrial fibrillation ([place])    a. status post multiple DCCVs; b. 2018 - eval for PVI but opted for rate control.  [name] PONV (postoperative nausea and vomiting)    oxycodone and codiene cause N/V   . Pre-syncope    a. In setting of dehydration and AKI in the past.  . Tachycardia induced cardiomyopathy ([place])    a. Resolved;  b. 08/2017 Echo: EF 50-55%, no rwma, mild MR, mildly to mod dil LA/RA; c. 02/2018 Echo: EF 55-60%, mild MR.  Mildly dil LA. Nl RVSP. PASP 76mmHg.  [name] Venous insufficiency   . Vertigo     Patient Active Problem List   Diagnosis Date Noted  . Prediabetes 12/31/2019  . Abnormal stress test   . Close exposure to COVID-19 virus 06/11/2019  . Elbow effusion, left 03/10/2019  . Headache 02/17/2019  . Pain of right scapula 08/01/2017  . Chest pain 07/22/2017  . Sinusitis 06/14/2017  . Influenza 01/10/2017  . Rash and nonspecific skin eruption 11/25/2016  . Concussion with no loss of consciousness 09/01/2016  . Chronic venous insufficiency 09/01/2016  . Preoperative clearance 06/29/2016  . Other fatigue 06/29/2016  . Visit for preventive health examination 12/15/2015  . GERD (gastroesophageal reflux disease) 11/20/2015  . Cystocele 11/18/2015  . Vaginal atrophy 11/18/2015  . Chronic suprapubic pain 10/14/2015  . Inguinal hernia 10/13/2015  . Impaired fasting glucose 04/24/2015  . Pulmonary hypertension, moderate to severe ([place]) 04/24/2015  . Arthritis of knee, degenerative 02/21/2015  . Vitamin D deficiency 12/27/2014  . S/P TAH-BSO 12/13/2014  . S/P bilateral mastectomy 12/13/2014  . Long term current use of anticoagulant therapy 09/25/2014  . HH (hiatus hernia) 04/20/2014  . Tachycardia induced cardiomyopathy ([place]) 07/20/2013  . Unspecified vitamin D deficiency 04/24/2013  . Family history of breast cancer in sister 04/23/2013  .  Medicare annual wellness visit, subsequent 04/23/2013  . Obesity 06/30/2012  . History of [place] spotted fever 06/22/2012  . Persistent atrial fibrillation ([place]) 05/09/2012  . Anxiety and depression 05/09/2012  . TACHYCARDIA 02/01/2011  . Hyperlipidemia 07/08/2010  . Essential hypertension 03/05/2009    Past Surgical History:  Procedure Laterality Date  . ABDOMINAL HYSTERECTOMY  1990  . APPENDECTOMY    . AUGMENTATION MAMMAPLASTY Bilateral 1986   implants  . AUGMENTATION MAMMAPLASTY  1990  . AUGMENTATION MAMMAPLASTY  2011  . CARDIAC  CATHETERIZATION    . CARDIOVERSION     x 3  . CARDIOVERSION    . CARDIOVERSION N/A 02/07/2017   Procedure: CARDIOVERSION;  Surgeon: Wellington Hampshire, MD;  Location: [facility];  Service: Cardiovascular;  Laterality: N/A;  . CARDIOVERSION N/A 07/22/2017   Procedure: Cardioversion;  Surgeon: Minna Merritts, MD;  Location: [facility];  Service: Cardiovascular;  Laterality: N/A;  . CATARACT EXTRACTION W/PHACO Right 09/21/2016   Procedure: CATARACT EXTRACTION PHACO AND INTRAOCULAR LENS PLACEMENT ([place]);  Surgeon: Birder Robson, MD;  Location: [facility];  Service: Ophthalmology;  Laterality: Right;  [place] 44.1 AP% 16.5 CDE 7.30 Fluid Pack Lot [id number]  . CATARACT EXTRACTION W/PHACO Left 10/19/2016   Procedure: CATARACT EXTRACTION PHACO AND INTRAOCULAR LENS PLACEMENT (IOC);  Surgeon: Birder Robson, MD;  Location: [facility];  Service: Ophthalmology;  Laterality: Left;  [place] 53.7 AP% 19.5 CDE 10.45 Fluid pack lot # [id number]  . CHOLECYSTECTOMY    . COMBINED AUGMENTATION MAMMAPLASTY AND ABDOMINOPLASTY    . JOINT REPLACEMENT Left 06/04/2013   left knee  . KNEE ARTHROSCOPY Right 08/16/2016   Procedure: ARTHROSCOPY KNEE, tear posterior horn medial meniscus, tear anterior and posterior horns of lateral meniscus, chondromalacia of lateral compartment grade 3 patella and grade 4 medial;  Surgeon: Dereck Leep, MD;  Location: [facility];  Service: Orthopedics;  Laterality: Right;  . LEFT HEART CATH AND CORONARY ANGIOGRAPHY Left 07/27/2019   Procedure: LEFT HEART CATH AND CORONARY ANGIOGRAPHY;  Surgeon: Wellington Hampshire, MD;  Location: [facility];  Service: Cardiovascular;  Laterality: Left;  [name] MASTECTOMY  1986   nipple sparing mastectomy/Bilateral with silicone  breast implants, s/p saline replacements  . Multiple orthopedic procedures    . NOSE SURGERY    . TEE WITHOUT CARDIOVERSION N/A 09/26/2017   Procedure: TRANSESOPHAGEAL ECHOCARDIOGRAM (TEE);  Surgeon: Fay Records, MD;  Location: [facility];   Service: Cardiovascular;  Laterality: N/A;  . TOTAL KNEE ARTHROPLASTY Left     OB History    Gravida  2   Para  2   Term  2   Preterm      AB      Living  2     SAB      TAB      Ectopic      Multiple      Live Births  2            Home Medications    Prior to Admission medications   Medication Sig Start Date End Date Taking? Authorizing Provider  acetaminophen (TYLENOL) 500 MG tablet Take 1,000 mg by mouth every 6 (six) hours as needed (pain).    Yes [provider]  aluminum-magnesium hydroxide 200-200 MG/5ML suspension Take 20 mLs by mouth every 6 (six) hours as needed for indigestion.    Yes [provider]  Carboxymethylcellul-Glycerin (LUBRICATING EYE DROPS OP) Place 1 drop into both eyes daily as needed (dry eyes).   Yes [provider]  cholecalciferol (VITAMIN D) 1000 units tablet Take 1,000 Units by mouth daily.   Yes [provider]  Coenzyme Q10 (COQ10) 200 MG CAPS Take 200 mg by mouth daily.   Yes [provider]  diltiazem (CARDIZEM CD) 240 MG 24 hr capsule Take 1 capsule (240 mg total) by mouth daily. 01/02/20 04/01/20 Yes Theora Gianotti, NP  ELIQUIS 5 MG TABS tablet TAKE ONE TABLET BY MOUTH TWICE DAILY Patient taking differently: Take 5 mg by mouth 2 (two) times daily.  05/08/19  Yes Wellington Hampshire, MD  metoprolol tartrate (LOPRESSOR) 50 MG tablet TAKE 1 TABLET BY MOUTH 3 TIMES DAILY 08/06/19  Yes Wellington Hampshire, MD  pantoprazole (PROTONIX) 40 MG tablet TAKE ONE TABLET BY MOUTH EVERY DAY Patient taking differently: Take 40 mg by mouth daily.  06/04/19  Yes Wellington Hampshire, MD  pravastatin (PRAVACHOL) 20 MG tablet TAKE ONE TABLET BY MOUTH EVERY DAY Patient taking differently: Take 20 mg by mouth every evening.  05/08/19  Yes [name], MD  Probiotic Product (PROBIOTIC ADVANCED PO) Take 1 tablet by mouth daily.   Yes [provider]  sodium chloride (OCEAN) 0.65 % SOLN nasal  spray Place 1 spray into both nostrils daily as needed for congestion.   Yes [provider]    Family History Family History  Problem Relation Age of Onset  . Heart disease Mother   . Cancer Father        stomach  . Heart disease Son        found at autopsy  . Cancer Sister        breast  . Breast cancer Sister   . Cancer Maternal Grandmother        breast  . Breast cancer Maternal Grandmother   . Cancer Sister        breast  . Breast cancer Sister   . Diabetes Brother   . Esophageal cancer Brother   . Ovarian cancer Neg Hx     Social History Social History   Tobacco Use  . Smoking status: Never Smoker  . Smokeless tobacco: Never Used  Substance Use Topics  . Alcohol use: No  . Drug use: No     Allergies   Iodine, Amiodarone, Biaxin [clarithromycin], Codeine, Digoxin and related, Enalapril, Fluocinonide, Iodinated diagnostic agents, Oxycodone, Promethazine, Tizanidine hcl, Warfarin and related, and Xarelto [rivaroxaban]   Review of Systems Review of Systems  Constitutional: Negative for appetite change, chills, fatigue, fever and unexpected weight change.  HENT: Negative for congestion, ear pain, rhinorrhea and sore throat.   Eyes: Negative for pain and visual disturbance.  Respiratory: Negative for cough and shortness of breath.   Cardiovascular: Negative for chest pain, palpitations and leg swelling.  Gastrointestinal: Negative for abdominal pain, constipation, diarrhea, nausea and vomiting.  Genitourinary: Negative for dysuria and hematuria.  Musculoskeletal: Negative for arthralgias and back pain.  Skin: Negative for color change and rash.  Allergic/Immunologic: Negative for immunocompromised state.  Neurological: Negative for dizziness, seizures, syncope, light-headedness and headaches.  Psychiatric/Behavioral: Negative for sleep disturbance.  All other systems reviewed and are negative.    Physical Exam Triage Vital Signs ED Triage Vitals   Enc Vitals Group     BP 01/09/20 1345 124/76     Pulse Rate 01/09/20 1345 89     Resp 01/09/20 1345 16     Temp 01/09/20 1345 98.2 F (36.8 C)     Temp Source 01/09/20 1345 Oral  SpO2 01/09/20 1345 96 %     Weight 01/09/20 1340 162 lb (73.5 kg)     Height 01/09/20 1340 5' 4.5" (1.638 m)     Head Circumference --      Peak Flow --      Pain Score 01/09/20 1340 0     Pain Loc --      Pain Edu? --      Excl. in [place]? --    No data found.  Updated Vital Signs BP 124/76 (BP Location: Left Arm)   Pulse 89   Temp 98.2 F (36.8 C) (Oral)   Resp 16   Ht 5' 4.5" (1.638 m)   Wt 162 lb (73.5 kg)   SpO2 96%   BMI 27.38 kg/m   Visual Acuity Right Eye Distance:   Left Eye Distance:   Bilateral Distance:    Right Eye Near:   Left Eye Near:    Bilateral Near:     Physical Exam Vitals and nursing note reviewed.  Constitutional:      General: She is not in acute distress.    Appearance: She is well-developed.  HENT:     Head: Normocephalic and atraumatic.     Right Ear: Tympanic membrane normal.     Ears:     Comments: L TM erythematous, bulging.     Nose: No congestion or rhinorrhea.     Mouth/Throat:     Pharynx: No oropharyngeal exudate or posterior oropharyngeal erythema.  Eyes:     Conjunctiva/sclera: Conjunctivae normal.     Pupils: Pupils are equal, round, and reactive to light.  Cardiovascular:     Rate and Rhythm: Normal rate. Rhythm irregular.     Heart sounds: No murmur.  Pulmonary:     Effort: Pulmonary effort is normal. No respiratory distress.     Breath sounds: Normal breath sounds. No wheezing.  Abdominal:     Palpations: Abdomen is soft.     Tenderness: There is no abdominal tenderness.  Musculoskeletal:        General: Normal range of motion.     Cervical back: Normal range of motion and neck supple.     Right lower leg: No edema.     Left lower leg: No edema.  Skin:    General: Skin is warm and dry.  Neurological:     General: No focal  deficit present.     Mental Status: She is alert and oriented to person, place, and time.  Psychiatric:        Mood and Affect: Mood normal.      UC Treatments / Results  Labs (all labs ordered are listed, but only abnormal results are displayed) Labs Reviewed  NOVEL CORONAVIRUS, NAA    EKG   Radiology No results found.  Procedures Procedures (including critical care time)  Medications Ordered in UC Medications - No data to display  Initial Impression / Assessment and Plan / UC Course  I have reviewed the triage vital signs and the nursing notes.  Pertinent labs & imaging results that were available during my care of the patient were reviewed by me and considered in my medical decision making (see chart for details).     Patient request for COVID test. PCR COVID test performed here. Instructed to quarantine until test results come back negative. Advised to take tylenol as needed for fever, aches. Go to ER for high fever, shortness of breath and consistent diarrhea, or other concerns.   Final Clinical Impressions(s) /  UC Diagnoses   Final diagnoses:  Patient request for diagnostic testing     Discharge Instructions     COVID test is pending. Advised to quarantine until test results are back.   May take tylenol as needed for fever, pain. Be sure to stay hydrated and that she needs to drink 8-10 glasses of water per day.  Go to the Emergency Room if experiencing shortness of breath, or other concerns.    ED Prescriptions    None     PDMP not reviewed this encounter.   Faustino Congress, NP 01/09/20 1458

## 2020-01-09 NOTE — Discharge Instructions (Addendum)
COVID test is pending. Advised to quarantine until test results are back.   May take tylenol as needed for fever, pain. Be sure to stay hydrated and that she needs to drink 8-10 glasses of water per day.  Go to the Emergency Room if experiencing shortness of breath, or other concerns.

## 2020-01-09 NOTE — ED Triage Notes (Signed)
Patient states that she is here for Covid Testing. States that she has not had any known exposure but would like to be tested since her husband is having symptoms.

## 2020-01-10 LAB — NOVEL CORONAVIRUS, NAA: SARS-CoV-2, NAA: NOT DETECTED

## 2020-01-21 DIAGNOSIS — L821 Other seborrheic keratosis: Secondary | ICD-10-CM | POA: Diagnosis not present

## 2020-01-21 DIAGNOSIS — L820 Inflamed seborrheic keratosis: Secondary | ICD-10-CM | POA: Diagnosis not present

## 2020-01-21 DIAGNOSIS — D1800 Hemangioma unspecified site: Secondary | ICD-10-CM | POA: Diagnosis not present

## 2020-01-21 DIAGNOSIS — Z872 Personal history of diseases of the skin and subcutaneous tissue: Secondary | ICD-10-CM | POA: Diagnosis not present

## 2020-01-21 DIAGNOSIS — D485 Neoplasm of uncertain behavior of skin: Secondary | ICD-10-CM | POA: Diagnosis not present

## 2020-01-22 DIAGNOSIS — H60332 Swimmer's ear, left ear: Secondary | ICD-10-CM | POA: Diagnosis not present

## 2020-01-24 ENCOUNTER — Other Ambulatory Visit: Payer: Self-pay | Admitting: Cardiovascular Disease

## 2020-01-25 ENCOUNTER — Other Ambulatory Visit: Payer: Self-pay

## 2020-01-25 NOTE — Telephone Encounter (Signed)
*  STAT* If patient is at the pharmacy, call can be transferred to refill team.   1. Which medications need to be refilled? (please list name of each medication and dose if known)  Eliquis 2. Which pharmacy/location (including street and city if local pharmacy) is medication to be sent to? [facility]  3. Do they need a 30 day or 90 day supply? [place]

## 2020-01-28 MED ORDER — APIXABAN 5 MG PO TABS: 5.0000 mg | ORAL_TABLET | Freq: Two times a day (BID) | ORAL | 5 refills | Status: DC

## 2020-01-28 NOTE — Telephone Encounter (Signed)
Refill Request.  

## 2020-01-28 NOTE — Telephone Encounter (Signed)
Pt's age 84, wt 73.5 kg, SCr 0.81, CrCl 62.13, last ov w/ CB 01/02/20.

## 2020-01-29 DIAGNOSIS — H903 Sensorineural hearing loss, bilateral: Secondary | ICD-10-CM | POA: Diagnosis not present

## 2020-02-06 ENCOUNTER — Ambulatory Visit: Payer: Medicare Other

## 2020-02-13 ENCOUNTER — Other Ambulatory Visit: Payer: Self-pay

## 2020-02-13 ENCOUNTER — Ambulatory Visit (INDEPENDENT_AMBULATORY_CARE_PROVIDER_SITE_OTHER): Payer: Medicare Other | Admitting: Obstetrics and Gynecology

## 2020-02-13 ENCOUNTER — Encounter: Payer: Self-pay | Admitting: Obstetrics and Gynecology

## 2020-02-13 VITALS — BP 93/56 | HR 74 | Ht 64.5 in | Wt 166.7 lb

## 2020-02-13 DIAGNOSIS — Z01419 Encounter for gynecological examination (general) (routine) without abnormal findings: Secondary | ICD-10-CM

## 2020-02-13 DIAGNOSIS — Z1231 Encounter for screening mammogram for malignant neoplasm of breast: Secondary | ICD-10-CM

## 2020-02-13 DIAGNOSIS — Z90710 Acquired absence of both cervix and uterus: Secondary | ICD-10-CM | POA: Diagnosis not present

## 2020-02-13 DIAGNOSIS — N952 Postmenopausal atrophic vaginitis: Secondary | ICD-10-CM

## 2020-02-13 DIAGNOSIS — Z90722 Acquired absence of ovaries, bilateral: Secondary | ICD-10-CM

## 2020-02-13 DIAGNOSIS — Z780 Asymptomatic menopausal state: Secondary | ICD-10-CM

## 2020-02-13 DIAGNOSIS — M85859 Other specified disorders of bone density and structure, unspecified thigh: Secondary | ICD-10-CM

## 2020-02-13 DIAGNOSIS — Z9079 Acquired absence of other genital organ(s): Secondary | ICD-10-CM

## 2020-02-13 NOTE — Progress Notes (Signed)
ANNUAL PREVENTATIVE CARE GYNECOLOGY  ENCOUNTER NOTE  Subjective:       [name] [name] is a 84 y.o. G51P2002 female here for a routine annual gynecologic exam. The patient is occasionally sexually active. The patient has never been taking hormone replacement therapy. Patient denies post-menopausal vaginal bleeding. The patient wears seatbelts: yes. The patient participates in regular exercise: no, not regularly. Has the patient ever been transfused?: no.   Current complaints: 1.  None   Gynecologic History No LMP recorded. Patient has had a hysterectomy. Contraception: post menopausal status and hysterectomy Last Pap: 2015. Normal. No longer needed.  Last mammogram: 04/2017. Results were: normal Last Colonoscopy: 03/2014. Results were: normal.  Last Dexa Scan: 01/10/2016. Results: Osteopenia. T score is -1.5 (femur).   Obstetric History OB History  Gravida Para Term Preterm AB Living  2 2 2     2   SAB TAB Ectopic Multiple Live Births          2    # Outcome Date GA Lbr Len/2nd Weight Sex Delivery Anes PTL Lv  2 Term 1964    M VBAC   LIV  1 Term 1962    M Vag-Spont   LIV    Past Medical History:  Diagnosis Date  . Arthritis   . Atrial flutter ([place]) 02/2011   s/p cardioversion   . Chest pain    a. H/o cardiac cath x 2-> 2012 -->nl cors;  b. 12/2016 MV: EF 61%, small region of mild perfusion defect in the apical anteroseptal region c/w breast attenuation, no ischemia-->Low risk; c. 06/2019 MV: Mod size, mild inflat ischemia, EF 59%. Cor and Ao Ca2+. Inflat defect more pronounced on this study compared to last; c. 07/2019 Cath: LM nl, LAD min irregs, LCX nl, OM1/2/3 nl, RCA min irregs.  . CKD (chronic kidney disease), stage III   . Cystocele   . GERD (gastroesophageal reflux disease)   . Headache(784.0)    chronic  . Hyperlipidemia   . Hypertension   . Knee fracture   . Mobitz type 2 second degree atrioventricular block    a. felt to be 2/2 amiodarone, resolved with decreased  amiodarone dose.  Amio since d/c'd.  . Permanent atrial fibrillation ([place])    a. status post multiple DCCVs; b. 2018 - eval for PVI but opted for rate control.  [name] PONV (postoperative nausea and vomiting)    oxycodone and codiene cause N/V   . Pre-syncope    a. In setting of dehydration and AKI in the past.  . Tachycardia induced cardiomyopathy ([place])    a. Resolved;  b. 08/2017 Echo: EF 50-55%, no rwma, mild MR, mildly to mod dil LA/RA; c. 02/2018 Echo: EF 55-60%, mild MR. Mildly dil LA. Nl RVSP. PASP 26mmHg.  [name] Venous insufficiency   . Vertigo     Family History  Problem Relation Age of Onset  . Heart disease Mother   . Cancer Father        stomach  . Heart disease Son        found at autopsy  . Cancer Sister        breast  . Breast cancer Sister   . Cancer Maternal Grandmother        breast  . Breast cancer Maternal Grandmother   . Cancer Sister        breast  . Breast cancer Sister   . Diabetes Brother   . Esophageal cancer Brother   . Ovarian cancer Neg  Hx     Past Surgical History:  Procedure Laterality Date  . ABDOMINAL HYSTERECTOMY  1990  . APPENDECTOMY    . AUGMENTATION MAMMAPLASTY Bilateral 1986   implants  . AUGMENTATION MAMMAPLASTY  1990  . AUGMENTATION MAMMAPLASTY  2011  . CARDIAC CATHETERIZATION    . CARDIOVERSION     x 3  . CARDIOVERSION    . CARDIOVERSION N/A 02/07/2017   Procedure: CARDIOVERSION;  Surgeon: Wellington Hampshire, MD;  Location: [facility];  Service: Cardiovascular;  Laterality: N/A;  . CARDIOVERSION N/A 07/22/2017   Procedure: Cardioversion;  Surgeon: Minna Merritts, MD;  Location: [facility];  Service: Cardiovascular;  Laterality: N/A;  . CATARACT EXTRACTION W/PHACO Right 09/21/2016   Procedure: CATARACT EXTRACTION PHACO AND INTRAOCULAR LENS PLACEMENT ([place]);  Surgeon: Birder Robson, MD;  Location: [facility];  Service: Ophthalmology;  Laterality: Right;  [place] 44.1 AP% 16.5 CDE 7.30 Fluid Pack Lot [id number]  . CATARACT EXTRACTION W/PHACO Left  10/19/2016   Procedure: CATARACT EXTRACTION PHACO AND INTRAOCULAR LENS PLACEMENT ([facility]);  Surgeon: Birder Robson, MD;  Location: [facility];  Service: Ophthalmology;  Laterality: Left;  [place] 53.7 AP% 19.5 CDE 10.45 Fluid pack lot # [id number]  . CHOLECYSTECTOMY    . COMBINED AUGMENTATION MAMMAPLASTY AND ABDOMINOPLASTY    . JOINT REPLACEMENT Left 06/04/2013   left knee  . KNEE ARTHROSCOPY Right 08/16/2016   Procedure: ARTHROSCOPY KNEE, tear posterior horn medial meniscus, tear anterior and posterior horns of lateral meniscus, chondromalacia of lateral compartment grade 3 patella and grade 4 medial;  Surgeon: Dereck Leep, MD;  Location: [facility];  Service: Orthopedics;  Laterality: Right;  . LEFT HEART CATH AND CORONARY ANGIOGRAPHY Left 07/27/2019   Procedure: LEFT HEART CATH AND CORONARY ANGIOGRAPHY;  Surgeon: Wellington Hampshire, MD;  Location: [facility];  Service: Cardiovascular;  Laterality: Left;  [name] MASTECTOMY  1986   nipple sparing mastectomy/Bilateral with silicone  breast implants, s/p saline replacements  . Multiple orthopedic procedures    . NOSE SURGERY    . TEE WITHOUT CARDIOVERSION N/A 09/26/2017   Procedure: TRANSESOPHAGEAL ECHOCARDIOGRAM (TEE);  Surgeon: Fay Records, MD;  Location: [facility];  Service: Cardiovascular;  Laterality: N/A;  . TOTAL KNEE ARTHROPLASTY Left     Social History   Socioeconomic History  . Marital status: Married    Spouse name: Not on file  . Number of children: 1  . Years of education: Not on file  . Highest education level: Not on file  Occupational History    Employer: RETIRED  Tobacco Use  . Smoking status: Never Smoker  . Smokeless tobacco: Never Used  Substance and Sexual Activity  . Alcohol use: No  . Drug use: No  . Sexual activity: Never    Birth control/protection: Surgical  Other Topics Concern  . Not on file  Social History Narrative   Married    Social Determinants of Health   Financial Resource Strain:   .  Difficulty of Paying Living Expenses: Not on file  Food Insecurity:   . Worried About Charity fundraiser in the Last Year: Not on file  . Ran Out of Food in the Last Year: Not on file  Transportation Needs:   . Lack of Transportation (Medical): Not on file  . Lack of Transportation (Non-Medical): Not on file  Physical Activity:   . Days of Exercise per Week: Not on file  . Minutes of Exercise per Session: Not on file  Stress:   . Feeling  of Stress : Not on file  Social Connections:   . Frequency of Communication with Friends and Family: Not on file  . Frequency of Social Gatherings with Friends and Family: Not on file  . Attends Religious Services: Not on file  . Active Member of Clubs or Organizations: Not on file  . Attends Archivist Meetings: Not on file  . Marital Status: Not on file  Intimate Partner Violence:   . Fear of Current or Ex-Partner: Not on file  . Emotionally Abused: Not on file  . Physically Abused: Not on file  . Sexually Abused: Not on file    Current Outpatient Medications on File Prior to Visit  Medication Sig Dispense Refill  . acetaminophen (TYLENOL) 500 MG tablet Take 1,000 mg by mouth every 6 (six) hours as needed (pain).     [name] aluminum-magnesium hydroxide 200-200 MG/5ML suspension Take 20 mLs by mouth every 6 (six) hours as needed for indigestion.     [name] apixaban (ELIQUIS) 5 MG TABS tablet Take 1 tablet (5 mg total) by mouth 2 (two) times daily. 60 tablet 5  . Carboxymethylcellul-Glycerin (LUBRICATING EYE DROPS OP) Place 1 drop into both eyes daily as needed (dry eyes).    . cholecalciferol (VITAMIN D) 1000 units tablet Take 1,000 Units by mouth daily.    . Coenzyme Q10 (COQ10) 200 MG CAPS Take 200 mg by mouth daily.    [name] diltiazem (CARDIZEM CD) 240 MG 24 hr capsule Take 1 capsule (240 mg total) by mouth daily. (Patient taking differently: Take 180 mg by mouth daily. ) 90 capsule 3  . metoprolol tartrate (LOPRESSOR) 50 MG tablet TAKE 1 TABLET  BY MOUTH 3 TIMES DAILY 270 tablet 3  . pantoprazole (PROTONIX) 40 MG tablet TAKE 1 TABLET EVERY DAY 30 tablet 2  . pravastatin (PRAVACHOL) 20 MG tablet TAKE ONE TABLET BY MOUTH EVERY DAY (Patient taking differently: Take 20 mg by mouth every evening. ) 90 tablet 1  . Probiotic Product (PROBIOTIC ADVANCED PO) Take 1 tablet by mouth daily.    . sodium chloride (OCEAN) 0.65 % SOLN nasal spray Place 1 spray into both nostrils daily as needed for congestion.     No current facility-administered medications on file prior to visit.    Allergies  Allergen Reactions  . Iodine Anaphylaxis    NO PROBLEMS WITH BETADINE  . Amiodarone     Cannot remember   . Biaxin [Clarithromycin] Nausea And Vomiting  . Codeine Nausea And Vomiting  . Digoxin And Related Other (See Comments)    Fatigue, eye puffiness, hoarsness  . Enalapril Other (See Comments)    unknown  . Fluocinonide Other (See Comments)    Tingling sensation in head and redness to scalp.  . Iodinated Diagnostic Agents Other (See Comments)    Tachycardia  . Oxycodone Nausea And Vomiting  . Promethazine Other (See Comments)    Unknown   . Tizanidine Hcl Other (See Comments)    hypotension   . Warfarin And Related     Bleeding   . Xarelto [Rivaroxaban] Other (See Comments)    bleeding      Review of Systems ROS Review of Systems - General ROS: negative for - chills, fatigue, fever, hot flashes, night sweats, weight gain or weight loss Psychological ROS: negative for - anxiety, decreased libido, depression, mood swings, physical abuse or sexual abuse Ophthalmic ROS: negative for - blurry vision, eye pain or loss of vision ENT ROS: negative for - headaches, hearing change,  visual changes or vocal changes Allergy and Immunology ROS: negative for - hives, itchy/watery eyes or seasonal allergies Hematological and Lymphatic ROS: negative for - bleeding problems, bruising, swollen lymph nodes or weight loss Endocrine ROS: negative for  - galactorrhea, hair pattern changes, hot flashes, malaise/lethargy, mood swings, palpitations, polydipsia/polyuria, skin changes, temperature intolerance or unexpected weight changes Breast ROS: negative for - new or changing breast lumps or nipple discharge Respiratory ROS: negative for - cough or shortness of breath Cardiovascular ROS: negative for - chest pain, irregular heartbeat, palpitations or shortness of breath Gastrointestinal ROS: no abdominal pain, change in bowel habits, or black or bloody stools Genito-Urinary ROS: no dysuria, trouble voiding, or hematuria.  Does note occasional urinary frequency but managing with cranberry extract and increasing water intake.  Musculoskeletal ROS: negative for - joint pain or joint stiffness Neurological ROS: negative for - bowel and bladder control changes Dermatological ROS: negative for rash and skin lesion changes   Objective:   BP (!) 93/56   Pulse 74   Ht 5' 4.5" (1.638 m)   Wt 166 lb 11.2 oz (75.6 kg)   BMI 28.17 kg/m  CONSTITUTIONAL: Well-developed, well-nourished female in no acute distress.  PSYCHIATRIC: Normal mood and affect. Normal behavior. Normal judgment and thought content. [place]: Alert and oriented to person, place, and time. Normal muscle tone coordination. No cranial nerve deficit noted. HENT:  Normocephalic, atraumatic, External right and left ear normal. Oropharynx is clear and moist EYES: Conjunctivae and EOM are normal. Pupils are equal, round, and reactive to light. No scleral icterus.  NECK: Normal range of motion, supple, no masses.  Normal thyroid.  SKIN: Skin is warm and dry. No rash noted. Not diaphoretic. No erythema. No pallor. CARDIOVASCULAR: Normal heart rate noted, regular rhythm, no murmur. RESPIRATORY: Clear to auscultation bilaterally. Effort and breath sounds normal, no problems with respiration noted. BREASTS: Symmetric in size. No masses, skin changes, nipple drainage, or lymphadenopathy.  Bilateral breast implants present.  ABDOMEN: Soft, normal bowel sounds, no distention noted.  No tenderness, rebound or guarding.  BLADDER: Normal PELVIC:  Bladder no bladder distension noted  Urethra: normal appearing urethra with no masses, tenderness or lesions  Vulva: normal appearing vulva with no masses, tenderness or lesions  Vagina: mildly atrophic. No discharge or lesions.   Cervix: surgically absent  Uterus: surgically absent, vaginal cuff well healed  Adnexa: surgically absent bilateral  RV: External Exam NormaI (except skin tag noted), No Rectal Masses and Normal Sphincter tone  MUSCULOSKELETAL: Normal range of motion. No tenderness.  No cyanosis, clubbing, or edema.  2+ distal pulses. LYMPHATIC: No Axillary, Supraclavicular, or Inguinal Adenopathy.   Labs: Lab Results  Component Value Date   WBC 8.1 07/18/2019   HGB 13.7 07/18/2019   HCT 41.4 07/18/2019   MCV 95 07/18/2019   PLT 261 07/18/2019    Lab Results  Component Value Date   CREATININE 0.81 12/20/2019   BUN 18 12/20/2019   NA 136 12/20/2019   K 4.4 12/20/2019   CL 100 12/20/2019   CO2 27 12/20/2019    Lab Results  Component Value Date   ALT 19 12/20/2019   AST 21 12/20/2019   ALKPHOS 71 12/20/2019   BILITOT 1.3 (H) 12/20/2019    Lab Results  Component Value Date   CHOL 153 12/20/2019   HDL 59.90 12/20/2019   LDLCALC 77 12/20/2019   TRIG 80.0 12/20/2019   CHOLHDL 3 12/20/2019    Lab Results  Component Value Date   TSH  3.500 07/21/2017    Lab Results  Component Value Date   HGBA1C 6.4 12/20/2019     Assessment:   1. Well woman exam with routine gynecological exam   2. Menopause   3. Vaginal atrophy   4. S/P TAH-BSO   5. Breast cancer screening by mammogram   6. Osteopenia of neck of femur, unspecified laterality     Plan:  Pap: Not needed. Beyond screening age Mammogram: Not Indicated. Beyond screening age Stool Guaiac Testing:  Not Indicated. Colonoscopy up to date.    Labs: None. Has labs done by PCP. Routine preventative health maintenance measures emphasized: Exercise/Diet/Weight control, Tobacco Warnings, Alcohol/Substance use risks, Stress Management and Peer Pressure Issues. Encouraged Vitamin D and Calcium supplementation for osteopenia.  Reports she has received her COVID vaccination series.  Up to date on flu and pneumonia vaccines.  Return to [place] have every other year physicals or can discontinue GYN care at this time unless problems arise.    Rubie Maid, MD  [facility]

## 2020-02-13 NOTE — Progress Notes (Signed)
Pt present for annual exam. Pt stated that she was doing well no problems. Pt due for mammogram order placed.

## 2020-02-13 NOTE — Patient Instructions (Signed)

## 2020-02-18 ENCOUNTER — Other Ambulatory Visit: Payer: Self-pay | Admitting: Internal Medicine

## 2020-02-21 ENCOUNTER — Other Ambulatory Visit: Payer: Self-pay

## 2020-02-21 ENCOUNTER — Ambulatory Visit: Payer: Medicare Other | Admitting: Podiatry

## 2020-02-21 ENCOUNTER — Encounter: Payer: Self-pay | Admitting: Podiatry

## 2020-02-21 DIAGNOSIS — M216X2 Other acquired deformities of left foot: Secondary | ICD-10-CM

## 2020-02-21 DIAGNOSIS — Q828 Other specified congenital malformations of skin: Secondary | ICD-10-CM | POA: Insufficient documentation

## 2020-02-21 NOTE — Progress Notes (Signed)
This patient presents the office with chief complaint of painful left forefoot for years.  She says that her forefoot is painful walking wearing her shoes.  She says that she has developed painful callus on the bottom of her left forefoot.  She also states that she is starting to have pain due to her big toenail on her left big toe.  She says this is been painful for weeks but is increasing in severity.  She presents the office today for an evaluation and treatment of her left painful foot.  Vascular  Dorsalis pedis and posterior tibial pulses are weakly  palpable  B/L.  Capillary return  WNL.  Temperature gradient is  WNL.  Skin turgor  WNL  Venous disease present on feet and legs  B/L  Sensorium  Senn Weinstein monofilament wire  WNL. Normal tactile sensation.  Nail Exam  Patient has normal nails with no evidence of bacterial or fungal infection.  Orthopedic  Exam  Muscle tone and muscle strength  WNL.  No limitations of motion feet  B/L.  No crepitus or joint effusion noted.  HAV B/L with left greater.  HT 2-4  B/L with left greater.  Skin  No open lesions.  Normal skin texture and turgor.  Porokeratosis sub 2,3 left foot  Porokeratosis sub 2,3  Left foot.  HAV with hammer toes  B/L.  ROV.  Debride hallux toenail left foot.  Debride porokeratosis.  Told her to cushion her left forefoot.  Most of her pain is due to bunion and hammer toes causing plantar flexed metatarsals  B/L    RTC prn.   Gardiner Barefoot DPM

## 2020-02-27 DIAGNOSIS — S8011XA Contusion of right lower leg, initial encounter: Secondary | ICD-10-CM | POA: Diagnosis not present

## 2020-02-27 DIAGNOSIS — M25571 Pain in right ankle and joints of right foot: Secondary | ICD-10-CM | POA: Diagnosis not present

## 2020-02-27 DIAGNOSIS — M25572 Pain in left ankle and joints of left foot: Secondary | ICD-10-CM | POA: Diagnosis not present

## 2020-03-24 ENCOUNTER — Emergency Department: Payer: Medicare Other

## 2020-03-24 ENCOUNTER — Other Ambulatory Visit: Payer: Self-pay

## 2020-03-24 ENCOUNTER — Emergency Department
Admission: EM | Admit: 2020-03-24 | Discharge: 2020-03-24 | Disposition: A | Discharge status: Home / Self Care | Payer: Medicare Other | Attending: Emergency Medicine | Admitting: Emergency Medicine

## 2020-03-24 ENCOUNTER — Telehealth: Payer: Self-pay | Admitting: Internal Medicine

## 2020-03-24 DIAGNOSIS — Z9049 Acquired absence of other specified parts of digestive tract: Secondary | ICD-10-CM | POA: Diagnosis not present

## 2020-03-24 DIAGNOSIS — Y999 Unspecified external cause status: Secondary | ICD-10-CM | POA: Insufficient documentation

## 2020-03-24 DIAGNOSIS — Y929 Unspecified place or not applicable: Secondary | ICD-10-CM | POA: Diagnosis not present

## 2020-03-24 DIAGNOSIS — S199XXA Unspecified injury of neck, initial encounter: Secondary | ICD-10-CM | POA: Diagnosis not present

## 2020-03-24 DIAGNOSIS — S161XXA Strain of muscle, fascia and tendon at neck level, initial encounter: Secondary | ICD-10-CM

## 2020-03-24 DIAGNOSIS — W1789XA Other fall from one level to another, initial encounter: Secondary | ICD-10-CM | POA: Insufficient documentation

## 2020-03-24 DIAGNOSIS — W19XXXA Unspecified fall, initial encounter: Secondary | ICD-10-CM

## 2020-03-24 DIAGNOSIS — S0003XA Contusion of scalp, initial encounter: Secondary | ICD-10-CM | POA: Insufficient documentation

## 2020-03-24 DIAGNOSIS — R519 Headache, unspecified: Secondary | ICD-10-CM | POA: Diagnosis not present

## 2020-03-24 DIAGNOSIS — Y939 Activity, unspecified: Secondary | ICD-10-CM | POA: Insufficient documentation

## 2020-03-24 DIAGNOSIS — I129 Hypertensive chronic kidney disease with stage 1 through stage 4 chronic kidney disease, or unspecified chronic kidney disease: Secondary | ICD-10-CM | POA: Insufficient documentation

## 2020-03-24 DIAGNOSIS — Z79899 Other long term (current) drug therapy: Secondary | ICD-10-CM | POA: Diagnosis not present

## 2020-03-24 DIAGNOSIS — Z7901 Long term (current) use of anticoagulants: Secondary | ICD-10-CM | POA: Insufficient documentation

## 2020-03-24 DIAGNOSIS — Z96652 Presence of left artificial knee joint: Secondary | ICD-10-CM | POA: Insufficient documentation

## 2020-03-24 DIAGNOSIS — M542 Cervicalgia: Secondary | ICD-10-CM | POA: Diagnosis not present

## 2020-03-24 DIAGNOSIS — S0990XA Unspecified injury of head, initial encounter: Secondary | ICD-10-CM | POA: Diagnosis not present

## 2020-03-24 DIAGNOSIS — N183 Chronic kidney disease, stage 3 unspecified: Secondary | ICD-10-CM | POA: Insufficient documentation

## 2020-03-24 NOTE — Discharge Instructions (Addendum)
Follow-up with your primary care provider if any continued problems or concerns.  You may use ice to your scalp as needed for discomfort.  Continue with your regular medications.  CT scans today were negative for any acute changes.

## 2020-03-24 NOTE — Telephone Encounter (Signed)
Patient currently at ER

## 2020-03-24 NOTE — ED Provider Notes (Signed)
[facility] Emergency Department Provider Note   ____________________________________________   First MD Initiated Contact with Patient 03/24/20 1106     (approximate)  I have reviewed the triage vital signs and the nursing notes.   HISTORY  Chief Complaint Fall   HPI [name] [name] is a 84 y.o. female presents to the ED after being seen at [facility].  Patient states that 1 week ago she was on a lawn more after in the rain.  She states that the ground was wet and she was on a slight heel.  She states that she fell off the lawn more and may have hit her head or neck on the fender of the lawn more.  Patient denies any LOC or headaches but states that her scalp still "has a twinge".  Denies any visual changes, nausea or vomiting.  Patient has continued to be ambulatory since the accident.  Patient is on blood thinners.  She rates her pain as a 4 out of 10.       Past Medical History:  Diagnosis Date  . Arthritis   . Atrial flutter ([place]) 02/2011   s/p cardioversion   . Chest pain    a. H/o cardiac cath x 2-> 2012 -->nl cors;  b. 12/2016 MV: EF 61%, small region of mild perfusion defect in the apical anteroseptal region c/w breast attenuation, no ischemia-->Low risk; c. 06/2019 MV: Mod size, mild inflat ischemia, EF 59%. Cor and Ao Ca2+. Inflat defect more pronounced on this study compared to last; c. 07/2019 Cath: LM nl, LAD min irregs, LCX nl, OM1/2/3 nl, RCA min irregs.  . CKD (chronic kidney disease), stage III   . Cystocele   . GERD (gastroesophageal reflux disease)   . Headache(784.0)    chronic  . Hyperlipidemia   . Hypertension   . Knee fracture   . Mobitz type 2 second degree atrioventricular block    a. felt to be 2/2 amiodarone, resolved with decreased amiodarone dose.  Amio since d/c'd.  . Permanent atrial fibrillation ([place])    a. status post multiple DCCVs; b. 2018 - eval for PVI but opted for rate control.  [name] PONV (postoperative nausea and  vomiting)    oxycodone and codiene cause N/V   . Pre-syncope    a. In setting of dehydration and AKI in the past.  . Tachycardia induced cardiomyopathy ([place])    a. Resolved;  b. 08/2017 Echo: EF 50-55%, no rwma, mild MR, mildly to mod dil LA/RA; c. 02/2018 Echo: EF 55-60%, mild MR. Mildly dil LA. Nl RVSP. PASP 41mmHg.  [name] Venous insufficiency   . Vertigo     Patient Active Problem List   Diagnosis Date Noted  . Porokeratosis 02/21/2020  . Plantar flexed metatarsal bone of left foot 02/21/2020  . Prediabetes 12/31/2019  . Abnormal stress test   . Close exposure to COVID-19 virus 06/11/2019  . Elbow effusion, left 03/10/2019  . Headache 02/17/2019  . Pain of right scapula 08/01/2017  . Chest pain 07/22/2017  . Sinusitis 06/14/2017  . Influenza 01/10/2017  . Rash and nonspecific skin eruption 11/25/2016  . Concussion with no loss of consciousness 09/01/2016  . Chronic venous insufficiency 09/01/2016  . Preoperative clearance 06/29/2016  . Other fatigue 06/29/2016  . Visit for preventive health examination 12/15/2015  . GERD (gastroesophageal reflux disease) 11/20/2015  . Cystocele 11/18/2015  . Vaginal atrophy 11/18/2015  . Chronic suprapubic pain 10/14/2015  . Inguinal hernia 10/13/2015  . Impaired fasting glucose  04/24/2015  . Pulmonary hypertension, moderate to severe ([place]) 04/24/2015  . Arthritis of knee, degenerative 02/21/2015  . Vitamin D deficiency 12/27/2014  . S/P TAH-BSO 12/13/2014  . S/P bilateral mastectomy 12/13/2014  . Long term current use of anticoagulant therapy 09/25/2014  . HH (hiatus hernia) 04/20/2014  . Tachycardia induced cardiomyopathy ([place]) 07/20/2013  . Unspecified vitamin D deficiency 04/24/2013  . Family history of breast cancer in sister 04/23/2013  . Medicare annual wellness visit, subsequent 04/23/2013  . Obesity 06/30/2012  . History of [place] spotted fever 06/22/2012  . Persistent atrial fibrillation ([place]) 05/09/2012  . Anxiety and  depression 05/09/2012  . TACHYCARDIA 02/01/2011  . Hyperlipidemia 07/08/2010  . Essential hypertension 03/05/2009    Past Surgical History:  Procedure Laterality Date  . ABDOMINAL HYSTERECTOMY  1990  . APPENDECTOMY    . AUGMENTATION MAMMAPLASTY Bilateral 1986   implants  . AUGMENTATION MAMMAPLASTY  1990  . AUGMENTATION MAMMAPLASTY  2011  . CARDIAC CATHETERIZATION    . CARDIOVERSION     x 3  . CARDIOVERSION    . CARDIOVERSION N/A 02/07/2017   Procedure: CARDIOVERSION;  Surgeon: Wellington Hampshire, MD;  Location: [facility] ORS;  Service: Cardiovascular;  Laterality: N/A;  . CARDIOVERSION N/A 07/22/2017   Procedure: Cardioversion;  Surgeon: Minna Merritts, MD;  Location: [facility] ORS;  Service: Cardiovascular;  Laterality: N/A;  . CATARACT EXTRACTION W/PHACO Right 09/21/2016   Procedure: CATARACT EXTRACTION PHACO AND INTRAOCULAR LENS PLACEMENT ([place]);  Surgeon: Birder Robson, MD;  Location: [facility] ORS;  Service: Ophthalmology;  Laterality: Right;  [place] 44.1 AP% 16.5 CDE 7.30 Fluid Pack Lot [id number]  . CATARACT EXTRACTION W/PHACO Left 10/19/2016   Procedure: CATARACT EXTRACTION PHACO AND INTRAOCULAR LENS PLACEMENT (IOC);  Surgeon: Birder Robson, MD;  Location: [facility] ORS;  Service: Ophthalmology;  Laterality: Left;  [place] 53.7 AP% 19.5 CDE 10.45 Fluid pack lot # [id number]  . CHOLECYSTECTOMY    . COMBINED AUGMENTATION MAMMAPLASTY AND ABDOMINOPLASTY    . JOINT REPLACEMENT Left 06/04/2013   left knee  . KNEE ARTHROSCOPY Right 08/16/2016   Procedure: ARTHROSCOPY KNEE, tear posterior horn medial meniscus, tear anterior and posterior horns of lateral meniscus, chondromalacia of lateral compartment grade 3 patella and grade 4 medial;  Surgeon: Dereck Leep, MD;  Location: [facility] ORS;  Service: Orthopedics;  Laterality: Right;  . LEFT HEART CATH AND CORONARY ANGIOGRAPHY Left 07/27/2019   Procedure: LEFT HEART CATH AND CORONARY ANGIOGRAPHY;  Surgeon: Wellington Hampshire, MD;  Location: [place] [facility];   Service: Cardiovascular;  Laterality: Left;  [name] MASTECTOMY  1986   nipple sparing mastectomy/Bilateral with silicone  breast implants, s/p saline replacements  . Multiple orthopedic procedures    . NOSE SURGERY    . TEE WITHOUT CARDIOVERSION N/A 09/26/2017   Procedure: TRANSESOPHAGEAL ECHOCARDIOGRAM (TEE);  Surgeon: Fay Records, MD;  Location: [place];  Service: Cardiovascular;  Laterality: N/A;  . TOTAL KNEE ARTHROPLASTY Left     Prior to Admission medications   Medication Sig Start Date End Date Taking? Authorizing Provider  acetaminophen (TYLENOL) 500 MG tablet Take 1,000 mg by mouth every 6 (six) hours as needed (pain).     [provider]  aluminum-magnesium hydroxide 200-200 MG/5ML suspension Take 20 mLs by mouth every 6 (six) hours as needed for indigestion.     [provider]  apixaban (ELIQUIS) 5 MG TABS tablet Take 1 tablet (5 mg total) by mouth 2 (two) times daily. 01/28/20   Minna Merritts, MD  Carboxymethylcellul-Glycerin (  LUBRICATING EYE DROPS OP) Place 1 drop into both eyes daily as needed (dry eyes).    [provider]  cholecalciferol (VITAMIN D) 1000 units tablet Take 1,000 Units by mouth daily.    [provider]  Coenzyme Q10 (COQ10) 200 MG CAPS Take 200 mg by mouth daily.    [provider]  diltiazem (CARDIZEM CD) 240 MG 24 hr capsule Take 1 capsule (240 mg total) by mouth daily. Patient taking differently: Take 180 mg by mouth daily.  01/02/20 04/01/20  Theora Gianotti, NP  metoprolol tartrate (LOPRESSOR) 50 MG tablet TAKE 1 TABLET BY MOUTH 3 TIMES DAILY 08/06/19   Wellington Hampshire, MD  pantoprazole (PROTONIX) 40 MG tablet TAKE 1 TABLET EVERY DAY 01/24/20   Wellington Hampshire, MD  pravastatin (PRAVACHOL) 20 MG tablet TAKE 1 TABLET DAILY 02/18/20   [name], MD  Probiotic Product (PROBIOTIC ADVANCED PO) Take 1 tablet by mouth daily.    [provider]  sodium chloride (OCEAN) 0.65 % SOLN nasal spray  Place 1 spray into both nostrils daily as needed for congestion.    [provider]    Allergies Iodine, Amiodarone, Biaxin [clarithromycin], Codeine, Digoxin and related, Enalapril, Fluocinonide, Iodinated diagnostic agents, Oxycodone, Promethazine, Tizanidine hcl, Warfarin and related, and Xarelto [rivaroxaban]  Family History  Problem Relation Age of Onset  . Heart disease Mother   . Cancer Father        stomach  . Heart disease Son        found at autopsy  . Cancer Sister        breast  . Breast cancer Sister   . Cancer Maternal Grandmother        breast  . Breast cancer Maternal Grandmother   . Cancer Sister        breast  . Breast cancer Sister   . Diabetes Brother   . Esophageal cancer Brother   . Ovarian cancer Neg Hx     Social History Social History   Tobacco Use  . Smoking status: Never Smoker  . Smokeless tobacco: Never Used  Substance Use Topics  . Alcohol use: No  . Drug use: No    Review of Systems Constitutional: No fever/chills Eyes: No visual changes. ENT: No trauma. Cardiovascular: Denies chest pain. Respiratory: Denies shortness of breath. Gastrointestinal: No abdominal pain.  No nausea, no vomiting.  Genitourinary: Negative for dysuria.  Negative for hematuria. Musculoskeletal: Positive for left foot pain which is already been x-rayed. Skin: Negative for rash.  No abrasions. Neurological: Negative for headaches, focal weakness or numbness.  ____________________________________________   PHYSICAL EXAM:  VITAL SIGNS: ED Triage Vitals [03/24/20 1046]  Enc Vitals Group     BP (!) 149/90     Pulse Rate 90     Resp 18     Temp 98 F (36.7 C)     Temp src      SpO2 97 %     Weight 164 lb (74.4 kg)     Height 5\' 2"  (1.575 m)     Head Circumference      Peak Flow      Pain Score 4     Pain Loc      Pain Edu?      Excl. in [place]?     Constitutional: Alert and oriented. Well appearing and in no acute distress. Eyes:  Conjunctivae are normal. PERRL. EOMI. Head: Atraumatic. Nose: No congestion/rhinnorhea. Neck: No stridor.  No point tenderness  on palpation posteriorly.  No skin discoloration noted. Cardiovascular: Normal rate, regular rhythm. Grossly normal heart sounds.  Good peripheral circulation. Respiratory: Normal respiratory effort.  No retractions. Lungs CTAB. Gastrointestinal: Soft and nontender. No distention.  Musculoskeletal: No point tenderness on palpation of the thoracic or lumbar spine.  Patient is able move upper and lower extremities without any difficulty.  She is ambulatory without any assistance. Neurologic:  Normal speech and language. No gross focal neurologic deficits are appreciated. No gait instability. Skin:  Skin is warm, dry and intact.  On examination of the scalp posteriorly there is no discoloration or abrasions noted.  Area is slightly tender to palpation on the left posterior aspect.  No soft tissue edema is present. Psychiatric: Mood and affect are normal. Speech and behavior are normal.  ____________________________________________   LABS (all labs ordered are listed, but only abnormal results are displayed)  Labs Reviewed - No data to display RADIOLOGY   Official radiology report(s): CT Head Wo Contrast  Result Date: 03/24/2020 CLINICAL DATA:  Persistent headache following fall 2 weeks prior EXAM: CT HEAD WITHOUT CONTRAST TECHNIQUE: Contiguous axial images were obtained from the base of the skull through the vertex without intravenous contrast. COMPARISON:  February 12, 2019 FINDINGS: Brain: Mild diffuse atrophy is stable. There is no intracranial mass, hemorrhage, extra-axial fluid collection, or midline shift. Brain parenchyma appears unremarkable. No demonstrable acute infarct. Vascular: No hyperdense vessel. No appreciable vascular calcification. Skull: The bony calvarium appears intact. Sinuses/Orbits: There is mucosal thickening in several ethmoid air cells. Other  visualized paranasal sinuses are clear. Visualized orbits appear symmetric bilaterally. Other: Mastoid air cells are clear. IMPRESSION: Mild diffuse atrophy, stable. No findings indicative of acute infarct. No mass or hemorrhage. Mucosal thickening noted in several ethmoid air cells. Electronically Signed   By: Lowella Grip III M.D.   On: 03/24/2020 11:23   CT Cervical Spine Wo Contrast  Result Date: 03/24/2020 CLINICAL DATA:  Neck pain, fall off lawnmower 2 weeks ago EXAM: CT CERVICAL SPINE WITHOUT CONTRAST TECHNIQUE: Multidetector CT imaging of the cervical spine was performed without intravenous contrast. Multiplanar CT image reconstructions were also generated. COMPARISON:  Multiple prior imaging evaluations most recent 10/12/2017 FINDINGS: Alignment: Straightening of normal cervical lordosis and mild reversal of normal cervical lordosis in the mid and upper cervical spine in the setting of extensive degenerative change not changed from previous imaging. Skull base and vertebrae: No acute fracture. No primary bone lesion or focal pathologic process. Soft tissues and spinal canal: No prevertebral fluid or swelling. No visible canal hematoma. Disc levels: Multilevel degenerative changes greatest at C3-4, C4-5 and C5-6 and C6-7 in terms of discogenic changes and loss of disc space. Facet hypertrophy worse on the right than the left is similar to previous imaging. Upper chest: Negative. Other: None IMPRESSION: 1. No acute fracture or dislocation. 2. Multilevel degenerative changes as before. Electronically Signed   By: Zetta Bills M.D.   On: 03/24/2020 11:49    ____________________________________________   PROCEDURES  Procedure(s) performed (including Critical Care):  Procedures   ____________________________________________   INITIAL IMPRESSION / ASSESSMENT AND PLAN / ED COURSE  As part of my medical decision making, I reviewed the following data within the electronic MEDICAL RECORD NUMBER  Notes from prior ED visits and [place] Controlled Substance Database  84 year old female presents to the ED with complaint of scalp discomfort after an accident 1 week ago in which she fell from a [employer], nature.  Patient denies any head injury and states  that she may have hit her head on the fender of the lawn more.  Patient denies any visual changes, nausea or vomiting.  She states that she does not actually have a headache but more of a "twinge" to her scalp.  On examination there was no skin discoloration or evidence of abrasions.  Patient was advised to have this checked out as she is on blood thinners.  CT cervical spine and head were negative for acute bony injury or bleeding.  Patient was reassured.  She is aware that she can take Tylenol if needed and also ice to her scalp.  Patient will follow up with her PCP if any continued problems.  ____________________________________________   FINAL CLINICAL IMPRESSION(S) / ED DIAGNOSES  Final diagnoses:  Contusion of scalp, initial encounter  Cervical strain, acute, initial encounter  Fall, initial encounter     ED Discharge Orders    None       Note:  This document was prepared using Dragon voice recognition software and may include unintentional dictation errors.    Johnn Hai, PA-C 03/24/20 1517    Blake Divine, MD 03/25/20 [phone]

## 2020-03-24 NOTE — Telephone Encounter (Signed)
Pt came in and states that she fell off the lawn mower about two weeks ago and had bruises and scrapes. She states that she hit her head on the seat when she fell off. Her head is still sore and wanted [place] to do a scan on her head. Pt states that she has no other symptoms other than sore scalp I let her know that we do not have to machines to do that here and pt states that she was going to go over to [facility] ED to get checked out.

## 2020-03-24 NOTE — ED Triage Notes (Addendum)
Pt comes via POV from home with c/o fall. Pt states she fell off the mower while going up a slight hill. Pt denies any LOC. Pt states some head pain. Pt states she is on blood thinners. Pt denies any other symptoms. Pt is A&OX4  Pt states this happened a week ago. Pt states her head is still sore and kinda tingling. Pt wants to get it checked out.

## 2020-03-24 NOTE — ED Notes (Signed)
See triage note  Present s/p fall  States she fell from lawn mower  Hitting her head    Denies any LOC  States this happened about 1 week ago  But conts to have headaches

## 2020-04-01 ENCOUNTER — Other Ambulatory Visit: Payer: Self-pay

## 2020-04-01 ENCOUNTER — Ambulatory Visit: Payer: Medicare Other | Admitting: General Surgery

## 2020-04-01 ENCOUNTER — Encounter: Payer: Self-pay | Admitting: General Surgery

## 2020-04-01 ENCOUNTER — Telehealth: Payer: Self-pay | Admitting: General Surgery

## 2020-04-01 VITALS — BP 131/72 | HR 68 | Temp 97.7°F | Resp 15 | Ht 64.0 in | Wt 166.4 lb

## 2020-04-01 DIAGNOSIS — L723 Sebaceous cyst: Secondary | ICD-10-CM

## 2020-04-01 NOTE — Patient Instructions (Addendum)
We will schedule you for an excision of your cyst of the right shoulder.  You will need to stop your Eliquis for 48 hours prior to your procedure.  We have scheduled this for Tuesday April 20th at 10:45 am. Your last dose of Eliquis will be on Saturday April 17th.       Epidermal Cyst Removal  Epidermal cyst removal is a procedure to remove a sac of oily material (keratin) that has formed under your skin (epidermal cyst). Epidermal cysts may also be called epidermoid cysts, sebaceous cysts, or keratin cysts. Normally, the skin secretes this oily material through a gland or a hair follicle. However, when a skin gland or hair follicle becomes blocked, an epidermal cyst can form. You may need this procedure if you have an epidermal cyst that becomes large, uncomfortable, or infected. Tell a health care provider about:  Any allergies you have.  All medicines you are taking, including vitamins, herbs, eye drops, creams, and over-the-counter medicines.  Any problems you or family members have had with anesthetic medicines.  Any blood disorders you have.  Any surgeries you have had.  Any medical conditions you have now or have had.  Whether you are pregnant or may be pregnant. What are the risks? Generally, this is a safe procedure. However, problems may occur, including:  Development of another cyst.  Bleeding.  Infection.  Scarring. What happens before the procedure?  Ask your health care provider about: ? Changing or stopping your regular medicines. This is especially important if you are taking diabetes medicines or blood thinners. ? Taking medicines such as aspirin and ibuprofen. These medicines can thin your blood. Do not take these medicines unless your health care provider tells you to take them. ? Taking over-the-counter medicines, vitamins, herbs, and supplements.  If you have an infected cyst, you may have to take antibiotic medicine before the cyst removal. Take  your antibiotic as told by your health care provider. Do not stop taking the antibiotic even if you start to feel better.  Take a shower on the morning of your procedure. Your health care provider may ask you to use a germ-killing soap. What happens during the procedure?   You will be given a medicine to numb the area (local anesthetic).  The skin around the cyst will be cleaned with a germ-killing solution.  The health care provider will make a small incision in your skin over the cyst.  The health care provider will separate the cyst from the surrounding tissues that are under your skin.  If possible, the cyst will be removed undamaged (intact).  If the cyst bursts (ruptures), it will be removed in pieces.  After the cyst is removed, the health care provider will control any bleeding and close the incision with small stitches (sutures). Small incisions may not need sutures, and the bleeding will be controlled by applying direct pressure with gauze.  The health care provider may apply antibiotic ointment and a bandage (dressing) over the incision. The procedure may vary among health care providers and hospitals. What happens after the procedure?  If your cyst ruptured during the procedure, you may need an antibiotic. If you are prescribed an antibiotic medicine or ointment, take or apply it as told by your health care provider. Do not stop using the antibiotic even if you start to feel better. Summary  Epidermal cyst removal is a procedure to remove a sac of oily material (keratin) that has formed under your skin (epidermal cyst).  You may need this procedure if you have an epidermal cyst that becomes large, uncomfortable, or infected.  The health care provider will make a small incision in your skin to remove the cyst.  If you are prescribed an antibiotic medicine before the procedure, after the procedure, or both, use the antibiotic as told by your health care provider. Do not  stop using the antibiotic even if you start to feel better. This information is not intended to replace advice given to you by your health care provider. Make sure you discuss any questions you have with your health care provider. Document Revised: 03/29/2019 Document Reviewed: 09/29/2017 Elsevier Patient Education  [place].

## 2020-04-01 NOTE — Telephone Encounter (Signed)
Patient is calling and is asking if we can r/s her procedure, patient is also asking questions about her medication that she will need to stop two days prior please call patient and advise.

## 2020-04-01 NOTE — Progress Notes (Signed)
Patient ID: [name] [name], female   DOB: Aug 03, 1936, 84 y.o.   MRN: [id number]  Chief Complaint  Patient presents with  . New Patient (Initial Visit)    Cysts    HPI [name] [name] is a 84 y.o. female.   She is here for evaluation of a possible cyst on her right shoulder.  She says she first noticed it about 2 weeks ago when she fell off the riding lawnmower at her home.  She says that it seems to be getting bigger.  It rubs against her bra strap and is irritated.  She denies any drainage from the site.  No fevers or chills.  No nausea or vomiting.  She says it is painful if she sleeps on it.  She has never had a similar lesion in the past.  She is interested in having it removed.   Past Medical History:  Diagnosis Date  . Arthritis   . Atrial flutter ([place]) 02/2011   s/p cardioversion   . Chest pain    a. H/o cardiac cath x 2-> 2012 -->nl cors;  b. 12/2016 MV: EF 61%, small region of mild perfusion defect in the apical anteroseptal region c/w breast attenuation, no ischemia-->Low risk; c. 06/2019 MV: Mod size, mild inflat ischemia, EF 59%. Cor and Ao Ca2+. Inflat defect more pronounced on this study compared to last; c. 07/2019 Cath: LM nl, LAD min irregs, LCX nl, OM1/2/3 nl, RCA min irregs.  . CKD (chronic kidney disease), stage III   . Cystocele   . GERD (gastroesophageal reflux disease)   . Headache(784.0)    chronic  . Hyperlipidemia   . Hypertension   . Knee fracture   . Mobitz type 2 second degree atrioventricular block    a. felt to be 2/2 amiodarone, resolved with decreased amiodarone dose.  Amio since d/c'd.  . Permanent atrial fibrillation ([place])    a. status post multiple DCCVs; b. 2018 - eval for PVI but opted for rate control.  [name] PONV (postoperative nausea and vomiting)    oxycodone and codiene cause N/V   . Pre-syncope    a. In setting of dehydration and AKI in the past.  . Tachycardia induced cardiomyopathy ([place])    a. Resolved;  b. 08/2017 Echo: EF 50-55%, no rwma,  mild MR, mildly to mod dil LA/RA; c. 02/2018 Echo: EF 55-60%, mild MR. Mildly dil LA. Nl RVSP. PASP 23mmHg.  [name] Venous insufficiency   . Vertigo     Past Surgical History:  Procedure Laterality Date  . ABDOMINAL HYSTERECTOMY  1990  . APPENDECTOMY    . AUGMENTATION MAMMAPLASTY Bilateral 1986   implants  . AUGMENTATION MAMMAPLASTY  1990  . AUGMENTATION MAMMAPLASTY  2011  . CARDIAC CATHETERIZATION    . CARDIOVERSION     x 3  . CARDIOVERSION    . CARDIOVERSION N/A 02/07/2017   Procedure: CARDIOVERSION;  Surgeon: Wellington Hampshire, MD;  Location: [facility];  Service: Cardiovascular;  Laterality: N/A;  . CARDIOVERSION N/A 07/22/2017   Procedure: Cardioversion;  Surgeon: Minna Merritts, MD;  Location: [facility];  Service: Cardiovascular;  Laterality: N/A;  . CATARACT EXTRACTION W/PHACO Right 09/21/2016   Procedure: CATARACT EXTRACTION PHACO AND INTRAOCULAR LENS PLACEMENT ([place]);  Surgeon: Birder Robson, MD;  Location: [facility];  Service: Ophthalmology;  Laterality: Right;  [place] 44.1 AP% 16.5 CDE 7.30 Fluid Pack Lot [id number]  . CATARACT EXTRACTION W/PHACO Left 10/19/2016   Procedure: CATARACT EXTRACTION PHACO AND INTRAOCULAR LENS PLACEMENT ([facility]);  Surgeon:  Birder Robson, MD;  Location: [facility];  Service: Ophthalmology;  Laterality: Left;  [place] 53.7 AP% 19.5 CDE 10.45 Fluid pack lot # [id number]  . CHOLECYSTECTOMY    . COMBINED AUGMENTATION MAMMAPLASTY AND ABDOMINOPLASTY    . JOINT REPLACEMENT Left 06/04/2013   left knee  . KNEE ARTHROSCOPY Right 08/16/2016   Procedure: ARTHROSCOPY KNEE, tear posterior horn medial meniscus, tear anterior and posterior horns of lateral meniscus, chondromalacia of lateral compartment grade 3 patella and grade 4 medial;  Surgeon: Dereck Leep, MD;  Location: [facility];  Service: Orthopedics;  Laterality: Right;  . LEFT HEART CATH AND CORONARY ANGIOGRAPHY Left 07/27/2019   Procedure: LEFT HEART CATH AND CORONARY ANGIOGRAPHY;  Surgeon: Wellington Hampshire, MD;   Location: [facility];  Service: Cardiovascular;  Laterality: Left;  [name] MASTECTOMY  1986   nipple sparing mastectomy/Bilateral with silicone  breast implants, s/p saline replacements  . Multiple orthopedic procedures    . NOSE SURGERY    . TEE WITHOUT CARDIOVERSION N/A 09/26/2017   Procedure: TRANSESOPHAGEAL ECHOCARDIOGRAM (TEE);  Surgeon: Fay Records, MD;  Location: [facility];  Service: Cardiovascular;  Laterality: N/A;  . TOTAL KNEE ARTHROPLASTY Left     Family History  Problem Relation Age of Onset  . Heart disease Mother   . Cancer Father        stomach  . Heart disease Son        found at autopsy  . Cancer Sister        breast  . Breast cancer Sister   . Cancer Maternal Grandmother        breast  . Breast cancer Maternal Grandmother   . Cancer Sister        breast  . Breast cancer Sister   . Diabetes Brother   . Esophageal cancer Brother   . Ovarian cancer Neg Hx     Social History Social History   Tobacco Use  . Smoking status: Never Smoker  . Smokeless tobacco: Never Used  Substance Use Topics  . Alcohol use: No  . Drug use: No    Allergies  Allergen Reactions  . Iodine Anaphylaxis    NO PROBLEMS WITH BETADINE  . Amiodarone     Cannot remember   . Biaxin [Clarithromycin] Nausea And Vomiting  . Codeine Nausea And Vomiting  . Digoxin And Related Other (See Comments)    Fatigue, eye puffiness, hoarsness  . Enalapril Other (See Comments)    unknown  . Fluocinonide Other (See Comments)    Tingling sensation in head and redness to scalp.  . Iodinated Diagnostic Agents Other (See Comments)    Tachycardia  . Oxycodone Nausea And Vomiting  . Promethazine Other (See Comments)    Unknown   . Tizanidine Hcl Other (See Comments)    hypotension   . Warfarin And Related     Bleeding   . Xarelto [Rivaroxaban] Other (See Comments)    bleeding    Current Outpatient Medications  Medication Sig Dispense Refill  . acetaminophen (TYLENOL) 500 MG  tablet Take 1,000 mg by mouth every 6 (six) hours as needed (pain).     [name] aluminum-magnesium hydroxide 200-200 MG/5ML suspension Take 20 mLs by mouth every 6 (six) hours as needed for indigestion.     [name] apixaban (ELIQUIS) 5 MG TABS tablet Take 1 tablet (5 mg total) by mouth 2 (two) times daily. 60 tablet 5  . Carboxymethylcellul-Glycerin (LUBRICATING EYE DROPS OP) Place 1 drop into both eyes daily  as needed (dry eyes).    . cholecalciferol (VITAMIN D) 1000 units tablet Take 1,000 Units by mouth daily.    . Coenzyme Q10 (COQ10) 200 MG CAPS Take 200 mg by mouth daily.    [name] diltiazem (CARDIZEM CD) 180 MG 24 hr capsule Take 180 mg by mouth daily.    . metoprolol tartrate (LOPRESSOR) 50 MG tablet TAKE 1 TABLET BY MOUTH 3 TIMES DAILY 270 tablet 3  . pantoprazole (PROTONIX) 40 MG tablet TAKE 1 TABLET EVERY DAY 30 tablet 2  . pravastatin (PRAVACHOL) 20 MG tablet TAKE 1 TABLET DAILY 90 tablet 0  . Probiotic Product (PROBIOTIC ADVANCED PO) Take 1 tablet by mouth daily.    . sodium chloride (OCEAN) 0.65 % SOLN nasal spray Place 1 spray into both nostrils daily as needed for congestion.     No current facility-administered medications for this visit.    Review of Systems Review of Systems  All other systems reviewed and are negative.   Blood pressure 131/72, pulse 68, temperature 97.7 F (36.5 C), resp. rate 15, height 5\' 4"  (1.626 m), weight 166 lb 6.4 oz (75.5 kg), SpO2 95 %. Body mass index is 28.56 kg/m.  Physical Exam Physical Exam Vitals reviewed.  Constitutional:      General: She is not in acute distress.    Appearance: Normal appearance.  HENT:     Head: Normocephalic and atraumatic.     Nose:     Comments: Covered with a mask secondary to COVID-19 precautions    Mouth/Throat:     Comments: Covered with a mask secondary to COVID-19 precautions Eyes:     General: No scleral icterus.       Right eye: No discharge.        Left eye: No discharge.     Conjunctiva/sclera:  Conjunctivae normal.  Neck:     Comments: No thyromegaly or dominant thyroid masses appreciated. Cardiovascular:     Rate and Rhythm: Rhythm irregular.     Comments: Irregularly irregular, consistent with her known atrial fibrillation. Pulmonary:     Effort: Pulmonary effort is normal. No respiratory distress.     Breath sounds: Normal breath sounds.  Abdominal:     General: Bowel sounds are normal.     Palpations: Abdomen is soft.  Genitourinary:    Comments: Deferred Musculoskeletal:     Cervical back: No rigidity.     Right lower leg: Edema present.     Left lower leg: Edema present.     Comments: 1+ pitting pretibial edema bilaterally.  Lymphadenopathy:     Cervical: No cervical adenopathy.  Skin:    General: Skin is warm and dry.     Findings: Lesion present.          Comments: There is an approximately 1.5 cm raised lesion on her right shoulder.  There is a central pore.  It is well-circumscribed and soft.  The skin is somewhat erythematous and the lesion is tender to manipulation.  Neurological:     General: No focal deficit present.     Mental Status: She is alert and oriented to person, place, and time.  Psychiatric:        Mood and Affect: Mood normal.        Behavior: Behavior normal.     Data Reviewed I reviewed the emergency department visit notes from March 24, 2020.  Specifically, the CT scan of her head that did not show any intracranial bleeding or infarct.  Assessment This is  an 84 year old woman who has a lesion on her right shoulder that appears most consistent with a sebaceous cyst.  Due to irritation from her bra strap, she would like to have it removed.  Unfortunately, she is on Eliquis which prohibits a bedside procedure today.  Plan We will schedule her for a procedure only visit next week.  She will not take her Eliquis on Sunday or Monday and we will remove the cyst in clinic on Tuesday.  The risks of the procedure were discussed with her.  These  include, but are not limited to, bleeding, infection, incomplete resection, wound healing issues, recurrence.  She is willing to accept these risks and desires to proceed.    Fredirick Maudlin 04/01/2020, 10:36 AM

## 2020-04-03 ENCOUNTER — Other Ambulatory Visit: Payer: Self-pay

## 2020-04-03 ENCOUNTER — Encounter: Payer: Self-pay | Admitting: Cardiovascular Disease

## 2020-04-03 ENCOUNTER — Ambulatory Visit: Payer: Medicare Other | Admitting: Cardiovascular Disease

## 2020-04-03 VITALS — BP 132/78 | HR 101 | Ht 64.0 in | Wt 162.2 lb

## 2020-04-03 DIAGNOSIS — I10 Essential (primary) hypertension: Secondary | ICD-10-CM

## 2020-04-03 DIAGNOSIS — I4821 Permanent atrial fibrillation: Secondary | ICD-10-CM

## 2020-04-03 NOTE — Patient Instructions (Signed)

## 2020-04-03 NOTE — Progress Notes (Signed)
Cardiology Office Note   Date:  04/03/2020   ID:  [name] [name], DOB 10/09/1936, MRN [name]  PCP:  [name], MD  Cardiologist:   [name], MD   Chief Complaint  Patient presents with  . other    6 mth f/u      History of Present Illness: [name] [name] is a 84 y.o. female who presents for a followup visit regarding permanent atrial fibrillation.   She has known history of  atrial fibrillation/flutter status post multiple cardioversions in the past (total of 7). 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 She had cardiac catheterization twice in the past without significant coronary artery disease. Most recent cardioversion was in Feb of 2018. Echocardiogram in March, 2019 showed an EF of 55 to 60% with mildly dilated left atrium and minimal pulmonary hypertension. She is being treated with rate control at the present time.   Most recent ischemic cardiac evaluation was in July 2020 which included a Lexiscan Myoview which showed possible inferolateral ischemia with normal ejection fraction.  Cardiac catheterization was followed in August which showed minimal irregularities with no evidence of obstructive coronary artery disease, normal ejection fraction mildly elevated left ventricular end-diastolic pressure.  The patient has been doing well with no recent chest pain, shortness of breath or palpitations.  During her last visit, she was noted to be hypertensive and the dose of diltiazem was increased to 240 mg daily.  However, there was subsequent improvement and she went back on the 180 mg daily.  She continues to take metoprolol 50 mg 3 times daily.  She is tolerating anticoagulation with no side effects. She did have a fall earlier this month when she was on the riding lawnmower but fortunately she had no serious injuries.   Past Medical History:  Diagnosis Date  . Arthritis   . Atrial flutter ([place]) 02/2011   s/p cardioversion    . Chest pain    a. H/o cardiac cath x 2-> 2012 -->nl cors;  b. 12/2016 MV: EF 61%, small region of mild perfusion defect in the apical anteroseptal region c/w breast attenuation, no ischemia-->Low risk; c. 06/2019 MV: Mod size, mild inflat ischemia, EF 59%. Cor and Ao Ca2+. Inflat defect more pronounced on this study compared to last; c. 07/2019 Cath: LM nl, LAD min irregs, LCX nl, OM1/2/3 nl, RCA min irregs.  . CKD (chronic kidney disease), stage III   . Cystocele   . GERD (gastroesophageal reflux disease)   . Headache(784.0)    chronic  . Hyperlipidemia   . Hypertension   . Knee fracture   . Mobitz type 2 second degree atrioventricular block    a. felt to be 2/2 amiodarone, resolved with decreased amiodarone dose.  Amio since d/c'd.  . Permanent atrial fibrillation ([place])    a. status post multiple DCCVs; b. 2018 - eval for PVI but opted for rate control.  [name] PONV (postoperative nausea and vomiting)    oxycodone and codiene cause N/V   . Pre-syncope    a. In setting of dehydration and AKI in the past.  . Tachycardia induced cardiomyopathy ([place])    a. Resolved;  b. 08/2017 Echo: EF 50-55%, no rwma, mild MR, mildly to mod dil LA/RA; c. 02/2018 Echo: EF 55-60%, mild MR. Mildly dil LA. Nl RVSP. PASP 84mmHg.  [name] Venous insufficiency   . Vertigo     Past Surgical History:  Procedure Laterality Date  . ABDOMINAL HYSTERECTOMY  1990  . APPENDECTOMY    . AUGMENTATION MAMMAPLASTY Bilateral 1986   implants  . AUGMENTATION MAMMAPLASTY  1990  . AUGMENTATION MAMMAPLASTY  2011  . CARDIAC CATHETERIZATION    . CARDIOVERSION     x 3  . CARDIOVERSION    . CARDIOVERSION N/A 02/07/2017   Procedure: CARDIOVERSION;  Surgeon: Wellington Hampshire, MD;  Location: [facility];  Service: Cardiovascular;  Laterality: N/A;  . CARDIOVERSION N/A 07/22/2017   Procedure: Cardioversion;  Surgeon: Minna Merritts, MD;  Location: [facility];  Service: Cardiovascular;  Laterality: N/A;  . CATARACT EXTRACTION W/PHACO Right  09/21/2016   Procedure: CATARACT EXTRACTION PHACO AND INTRAOCULAR LENS PLACEMENT ([place]);  Surgeon: Birder Robson, MD;  Location: [facility];  Service: Ophthalmology;  Laterality: Right;  [place] 44.1 AP% 16.5 CDE 7.30 Fluid Pack Lot [id number]  . CATARACT EXTRACTION W/PHACO Left 10/19/2016   Procedure: CATARACT EXTRACTION PHACO AND INTRAOCULAR LENS PLACEMENT ([facility]);  Surgeon: Birder Robson, MD;  Location: [facility];  Service: Ophthalmology;  Laterality: Left;  [place] 53.7 AP% 19.5 CDE 10.45 Fluid pack lot # [id number]  . CHOLECYSTECTOMY    . COMBINED AUGMENTATION MAMMAPLASTY AND ABDOMINOPLASTY    . JOINT REPLACEMENT Left 06/04/2013   left knee  . KNEE ARTHROSCOPY Right 08/16/2016   Procedure: ARTHROSCOPY KNEE, tear posterior horn medial meniscus, tear anterior and posterior horns of lateral meniscus, chondromalacia of lateral compartment grade 3 patella and grade 4 medial;  Surgeon: Dereck Leep, MD;  Location: [facility];  Service: Orthopedics;  Laterality: Right;  . LEFT HEART CATH AND CORONARY ANGIOGRAPHY Left 07/27/2019   Procedure: LEFT HEART CATH AND CORONARY ANGIOGRAPHY;  Surgeon: Wellington Hampshire, MD;  Location: [facility];  Service: Cardiovascular;  Laterality: Left;  [name] MASTECTOMY  1986   nipple sparing mastectomy/Bilateral with silicone  breast implants, s/p saline replacements  . Multiple orthopedic procedures    . NOSE SURGERY    . TEE WITHOUT CARDIOVERSION N/A 09/26/2017   Procedure: TRANSESOPHAGEAL ECHOCARDIOGRAM (TEE);  Surgeon: Fay Records, MD;  Location: [facility];  Service: Cardiovascular;  Laterality: N/A;  . TOTAL KNEE ARTHROPLASTY Left      Current Outpatient Medications  Medication Sig Dispense Refill  . acetaminophen (TYLENOL) 500 MG tablet Take 1,000 mg by mouth every 6 (six) hours as needed (pain).     [name] aluminum-magnesium hydroxide 200-200 MG/5ML suspension Take 20 mLs by mouth every 6 (six) hours as needed for indigestion.     [name] apixaban (ELIQUIS) 5 MG TABS  tablet Take 1 tablet (5 mg total) by mouth 2 (two) times daily. 60 tablet 5  . Carboxymethylcellul-Glycerin (LUBRICATING EYE DROPS OP) Place 1 drop into both eyes daily as needed (dry eyes).    . cholecalciferol (VITAMIN D) 1000 units tablet Take 1,000 Units by mouth daily.    . Coenzyme Q10 (COQ10) 200 MG CAPS Take 200 mg by mouth daily.    [name] diltiazem (CARDIZEM CD) 180 MG 24 hr capsule Take 180 mg by mouth daily.    . metoprolol tartrate (LOPRESSOR) 50 MG tablet TAKE 1 TABLET BY MOUTH 3 TIMES DAILY 270 tablet 3  . pantoprazole (PROTONIX) 40 MG tablet TAKE 1 TABLET EVERY DAY 30 tablet 2  . pravastatin (PRAVACHOL) 20 MG tablet TAKE 1 TABLET DAILY 90 tablet 0  . Probiotic Product (PROBIOTIC ADVANCED PO) Take 1 tablet by mouth daily.    . sodium chloride (OCEAN) 0.65 % SOLN nasal spray Place 1 spray into both nostrils daily as needed for  congestion.     No current facility-administered medications for this visit.    Allergies:   Iodine, Amiodarone, Biaxin [clarithromycin], Codeine, Digoxin and related, Enalapril, Fluocinonide, Iodinated diagnostic agents, Oxycodone, Promethazine, Tizanidine hcl, Warfarin and related, and Xarelto [rivaroxaban]    Social History:  The patient  reports that she has never smoked. She has never used smokeless tobacco. She reports that she does not drink alcohol or use drugs.   Family History:  The patient's family history includes Breast cancer in her maternal grandmother, sister, and sister; Cancer in her father, maternal grandmother, sister, and sister; Diabetes in her brother; Esophageal cancer in her brother; Heart disease in her mother and son.    ROS:  Please see the history of present illness.   Otherwise, review of systems are positive for none.   All other systems are reviewed and negative.    PHYSICAL EXAM: VS:  BP 132/78   Pulse (!) 101   Ht 5\' 4"  (1.626 m)   Wt 162 lb 3.2 oz (73.6 kg)   SpO2 96%   BMI 27.84 kg/m  , BMI Body mass index is 27.84  kg/m. GEN: Well nourished, well developed, in no acute distress  HEENT: Left temporal lobe tenderness Neck: no JVD, carotid bruits, or masses Cardiac: Irregularly irregular; no murmurs, rubs, or gallops,no edema  Respiratory:  clear to auscultation bilaterally, normal work of breathing GI: soft, nontender, nondistended, + BS MS: no deformity or atrophy  Skin: warm and dry, no rash Neuro:  Strength and sensation are intact Psych: euthymic mood, full affect Right radial pulses normal with no hematoma  EKG:  EKG is ordered today. The ekg ordered today demonstrates atrial fibrillation with right bundle branch block.  Ventricular rate is 101 bpm  Recent Labs: 07/18/2019: Hemoglobin 13.7; Platelets 261 12/20/2019: ALT 19; BUN 18; Creatinine, Ser 0.81; Potassium 4.4; Sodium 136    Lipid Panel    Component Value Date/Time   CHOL 153 12/20/2019 1035   TRIG 80.0 12/20/2019 1035   HDL 59.90 12/20/2019 1035   CHOLHDL 3 12/20/2019 1035   VLDL 16.0 12/20/2019 1035   LDLCALC 77 12/20/2019 1035      Wt Readings from Last 3 Encounters:  04/03/20 162 lb 3.2 oz (73.6 kg)  04/01/20 166 lb 6.4 oz (75.5 kg)  03/24/20 164 lb (74.4 kg)       ASSESSMENT AND PLAN:  1.  Permanent atrial fibrillation: Ventricular rate seems to be reasonably controlled  with diltiazem and metoprolol.  She is tolerating anticoagulation with Eliquis.  I discussed with her the importance of avoiding falls or injuries given that she is on anticoagulation.  2. Essential hypertension: Blood pressure is well controlled on metoprolol and diltiazem.  3. History of tachycardia-induced cardiomyopathy. Most recent ejection fraction was 55%.  4. Left carotid calcifications: Carotid Doppler in August of 2018 showed mild nonobstructive disease.     Disposition:   FU with me in 6 months     Signed, [name], MD 04/03/20 [place], [place]

## 2020-04-08 ENCOUNTER — Ambulatory Visit: Payer: Medicare Other | Admitting: General Surgery

## 2020-04-10 ENCOUNTER — Encounter: Payer: Self-pay | Admitting: General Surgery

## 2020-04-10 ENCOUNTER — Other Ambulatory Visit: Payer: Self-pay

## 2020-04-10 ENCOUNTER — Ambulatory Visit: Payer: Medicare Other | Admitting: General Surgery

## 2020-04-10 VITALS — BP 134/78 | HR 77 | Temp 97.7°F | Resp 12 | Ht 64.0 in | Wt 166.0 lb

## 2020-04-10 DIAGNOSIS — L723 Sebaceous cyst: Secondary | ICD-10-CM

## 2020-04-10 MED ORDER — AMOXICILLIN-POT CLAVULANATE 875-125 MG PO TABS: 1.0000 | ORAL_TABLET | Freq: Two times a day (BID) | ORAL | 0 refills | Status: AC

## 2020-04-10 NOTE — Progress Notes (Signed)
Sebaceous Cyst Excision Procedure Note  Pre-operative Diagnosis: Infected epidermal inclusion cyst  Post-operative Diagnosis: same  Locations:right, upper Back  Indications: This is an 84 year old woman who saw me last week with an irritated sebaceous cyst.  Due to being on Eliquis, we had to schedule her procedure after she had been off of it for a couple of days.  In the interim, the area has become much more red, inflamed, and tender.  Anesthesia: Lidocaine 1% with epinephrine without added sodium bicarbonate  Procedure Details  History of allergy to iodine: no  Patient informed of the risks (including bleeding and infection) and benefits of the  procedure and Written informed consent obtained.  The lesion and surrounding area was given a sterile prep using chlorhexidine and draped in the usual sterile fashion. An incision was made over the cyst, which was dissected free of the surrounding tissue and removed, however due to the inflammation, the cyst wall came out in fragments and I was unable to be certain that all of the cyst wall was removed..  The cyst was filled with malodorous purulent material.  A culture was obtained.  The wound was closed with 3-0 Nylon using simple interrupted stitches.  A sterile dressing was applied.  The specimen was not sent for pathologic examination. The patient tolerated the procedure well.  EBL: Less than 2 ml  Findings: Grossly infected epidermal inclusion cyst.  Cultures taken and pending.  Condition: Stable  Complications: none.  Plan: 1. Instructed to keep the wound dry and covered for 24-48h and clean thereafter. 2. Warning signs of infection were reviewed.   3. Recommended that the patient use OTC analgesics as needed for pain.  4. Return for suture removal in 10 days. 5.  10-day course of antibiotics prescribed.

## 2020-04-10 NOTE — Patient Instructions (Addendum)
We have removed a Cyst in our office today.  You have sutures that will have to be removed in the office. We placed a dry gauze on it and you may need to change it once a day or more if oozing.   You may resume your Eliquis tomorrow. You may shower tomorrow. Begin using a sports bra with a wide strap to help relieve irritation.   Pick up your antibiotic at your local pharmacy. You may take Tylenol or Ibuprofen as needed for pain. You may also use cold compresses to relieve swelling.    Avoid Strenuous activities that will make you sweat during the next 48 hours to avoid the glue coming off prematurely. Avoid activities that will place pressure to this area of the body for 1-2 weeks to avoid re-injury to incision site.  Please see your follow-up appointment provided. If you have any questions or concerns prior to this appointment, call our office and speak with a nurse.    Excision of Skin Cysts or Lesions Excision of a skin lesion refers to the removal of a section of skin by making small cuts (incisions) in the skin. This procedure may be done to remove a cancerous (malignant) or noncancerous (benign) growth on the skin. It is typically done to treat or prevent cancer or infection. It may also be done to improve cosmetic appearance. The procedure may be done to remove:  Cancerous growths, such as basal cell carcinoma, squamous cell carcinoma, or melanoma.  Noncancerous growths, such as a cyst or lipoma.  Growths, such as moles or skin tags, which may be removed for cosmetic reasons.  Various excision or surgical techniques may be used depending on your condition, the location of the lesion, and your overall health. Tell a health care provider about:  Any allergies you have.  All medicines you are taking, including vitamins, herbs, eye drops, creams, and over-the-counter medicines.  Any problems you or family members have had with anesthetic medicines.  Any blood disorders you  have.  Any surgeries you have had.  Any medical conditions you have.  Whether you are pregnant or may be pregnant. What are the risks? Generally, this is a safe procedure. However, problems may occur, including:  Bleeding.  Infection.  Scarring.  Recurrence of the cyst, lipoma, or cancer.  Changes in skin sensation or appearance, such as discoloration or swelling.  Reaction to the anesthetics.  Allergic reaction to surgical materials or ointments.  Damage to nerves, blood vessels, muscles, or other structures.  Continued pain.  What happens before the procedure?  Ask your health care provider about: ? Changing or stopping your regular medicines. This is especially important if you are taking diabetes medicines or blood thinners. ? Taking medicines such as aspirin and ibuprofen. These medicines can thin your blood. Do not take these medicines before your procedure if your health care provider instructs you not to.  You may be asked to take certain medicines.  You may be asked to stop smoking.  You may have an exam or testing.  Plan to have someone take you home after the procedure.  Plan to have someone help you with activities during recovery. What happens during the procedure?  To reduce your risk of infection: ? Your health care team will wash or sanitize their hands. ? Your skin will be washed with soap.  You will be given a medicine to numb the area (local anesthetic).  One of the following excision techniques will be performed.  At the end of any of these procedures, antibiotic ointment will be applied as needed. Each of the following techniques may vary among health care providers and hospitals. Complete Surgical Excision The area of skin that needs to be removed will be marked with a pen. Using a small scalpel or scissors, the surgeon will gently cut around and under the lesion until it is completely removed. The lesion will be placed in a fluid and sent  to the lab for examination. If necessary, bleeding will be controlled with a device that delivers heat (electrocautery). The edges of the wound may be stitched (sutured) together, and a bandage (dressing) will be applied. This procedure may be performed to treat a cancerous growth or a noncancerous cyst or lesion. Excision of a Cyst The surgeon will make an incision on the cyst. The entire cyst will be removed through the incision. The incision may be closed with sutures. Shave Excision During shave excision, the surgeon will use a small blade or an electrically heated loop instrument to shave off the lesion. This may be done to remove a mole or a skin tag. The wound will usually be left to heal on its own without sutures. Punch Excision During punch excision, the surgeon will use a small tool that is like a cookie cutter or a hole punch to cut a circle shape out of the skin. The outer edges of the skin will be sutured together. This may be done to remove a mole or a scar or to perform a biopsy of the lesion. Mohs Micrographic Surgery During Mohs micrographic surgery, layers of the lesion will be removed with a scalpel or a loop instrument and will be examined right away under a microscope. Layers will be removed until all of the abnormal or cancerous tissue has been removed. This procedure is minimally invasive, and it ensures the best cosmetic outcome. It involves the removal of as little normal tissue as possible. Mohs is usually done to treat skin cancer, such as basal cell carcinoma or squamous cell carcinoma, particularly on the face and ears. Depending on the size of the surgical wound, it may be sutured closed. What happens after the procedure?  Return to your normal activities as told by your health care provider.  Talk with your health care provider to discuss any test results, treatment options, and if necessary, the need for more tests. This information is not intended to replace advice  given to you by your health care provider. Make sure you discuss any questions you have with your health care provider. Document Released: 03/02/2010 Document Revised: 05/13/2016 Document Reviewed: 01/22/2015 Elsevier Interactive Patient Education  [employer].

## 2020-04-14 LAB — ANAEROBIC AND AEROBIC CULTURE: Result 1: NEGATIVE — AB

## 2020-04-23 ENCOUNTER — Ambulatory Visit: Payer: Medicare Other | Admitting: Dermatology

## 2020-04-24 ENCOUNTER — Ambulatory Visit (INDEPENDENT_AMBULATORY_CARE_PROVIDER_SITE_OTHER): Payer: Medicare Other | Admitting: General Surgery

## 2020-04-24 ENCOUNTER — Other Ambulatory Visit: Payer: Self-pay

## 2020-04-24 ENCOUNTER — Encounter: Payer: Self-pay | Admitting: General Surgery

## 2020-04-24 VITALS — BP 130/82 | HR 106 | Temp 96.6°F | Ht 62.0 in | Wt 165.2 lb

## 2020-04-24 DIAGNOSIS — L723 Sebaceous cyst: Secondary | ICD-10-CM

## 2020-04-24 DIAGNOSIS — L089 Local infection of the skin and subcutaneous tissue, unspecified: Secondary | ICD-10-CM

## 2020-04-24 NOTE — Progress Notes (Signed)
[name] [name] is here today for a post procedure check.  She is an 84 year old woman who had an inflamed and infected sebaceous cyst on her posterior right shoulder.  Due to the infection, the site was closed loosely with interrupted nylon suture.  She is here for suture removal and a wound check.  She states that she has done well since the procedure.  No nausea or vomiting, no fevers or chills.  Culture from the site grew skin flora.  Today's Vitals   04/24/20 0916  BP: 130/82  Pulse: (!) 106  Temp: (!) 96.6 F (35.9 C)  TempSrc: Temporal  SpO2: 95%  Weight: 165 lb 3.2 oz (74.9 kg)  Height: 5\' 2"  (1.575 m)  PainSc: 0-No pain  PainLoc: Shoulder   Body mass index is 30.22 kg/m. Focused examination of the procedure site demonstrates that the sutures are intact.  The skin is well approximated.  There is a small amount of old bruising, but no erythema, induration, or drainage present.   I removed her sutures today.  She may resume all of her usual activities and I will see her on an as-needed basis.

## 2020-04-24 NOTE — Patient Instructions (Addendum)
Dr.Cannon removed patient's stitches from the wound at today's visit. Follow-up with our office as needed.Please call and ask to speak with a nurse if you develop questions or concerns. Patient may continue with showering.   Wound Care, Adult Taking care of your wound properly can help to prevent pain, infection, and scarring. It can also help your wound to heal more quickly. How to care for your wound Wound care      Follow instructions from your health care provider about how to take care of your wound. Make sure you: ? Wash your hands with soap and water before you change the bandage (dressing). If soap and water are not available, use hand sanitizer. ? Change your dressing as told by your health care provider. ? Leave stitches (sutures), skin glue, or adhesive strips in place. These skin closures may need to stay in place for 2 weeks or longer. If adhesive strip edges start to loosen and curl up, you may trim the loose edges. Do not remove adhesive strips completely unless your health care provider tells you to do that.  Check your wound area every day for signs of infection. Check for: ? Redness, swelling, or pain. ? Fluid or blood. ? Warmth. ? Pus or a bad smell.  Ask your health care provider if you should clean the wound with mild soap and water. Doing this may include: ? Using a clean towel to pat the wound dry after cleaning it. Do not rub or scrub the wound. ? Applying a cream or ointment. Do this only as told by your health care provider. ? Covering the incision with a clean dressing.  Ask your health care provider when you can leave the wound uncovered.  Keep the dressing dry until your health care provider says it can be removed. Do not take baths, swim, use a hot tub, or do anything that would put the wound underwater until your health care provider approves. Ask your health care provider if you can take showers. You may only be allowed to take sponge  baths. Medicines   If you were prescribed an antibiotic medicine, cream, or ointment, take or use the antibiotic as told by your health care provider. Do not stop taking or using the antibiotic even if your condition improves.  Take over-the-counter and prescription medicines only as told by your health care provider. If you were prescribed pain medicine, take it 30 or more minutes before you do any wound care or as told by your health care provider. General instructions  Return to your normal activities as told by your health care provider. Ask your health care provider what activities are safe.  Do not scratch or pick at the wound.  Do not use any products that contain nicotine or tobacco, such as cigarettes and e-cigarettes. These may delay wound healing. If you need help quitting, ask your health care provider.  Keep all follow-up visits as told by your health care provider. This is important.  Eat a diet that includes protein, vitamin A, vitamin C, and other nutrient-rich foods to help the wound heal. ? Foods rich in protein include meat, dairy, beans, nuts, and other sources. ? Foods rich in vitamin A include carrots and dark green, leafy vegetables. ? Foods rich in vitamin C include citrus, tomatoes, and other fruits and vegetables. ? Nutrient-rich foods have protein, carbohydrates, fat, vitamins, or minerals. Eat a variety of healthy foods including vegetables, fruits, and whole grains. Contact a health care provider if:  You received a tetanus shot and you have swelling, severe pain, redness, or bleeding at the injection site.  Your pain is not controlled with medicine.  You have redness, swelling, or pain around the wound.  You have fluid or blood coming from the wound.  Your wound feels warm to the touch.  You have pus or a bad smell coming from the wound.  You have a fever or chills.  You are nauseous or you vomit.  You are dizzy. Get help right away if:  You  have a red streak going away from your wound.  The edges of the wound open up and separate.  Your wound is bleeding, and the bleeding does not stop with gentle pressure.  You have a rash.  You faint.  You have trouble breathing. Summary  Always wash your hands with soap and water before changing your bandage (dressing).  To help with healing, eat foods that are rich in protein, vitamin A, vitamin C, and other nutrients.  Check your wound every day for signs of infection. Contact your health care provider if you suspect that your wound is infected. This information is not intended to replace advice given to you by your health care provider. Make sure you discuss any questions you have with your health care provider. Document Revised: 03/26/2019 Document Reviewed: 06/22/2016 Elsevier Patient Education  [employer].

## 2020-04-26 ENCOUNTER — Other Ambulatory Visit: Payer: Self-pay

## 2020-04-26 ENCOUNTER — Emergency Department
Admission: EM | Admit: 2020-04-26 | Discharge: 2020-04-26 | Disposition: A | Discharge status: Home / Self Care | Payer: Medicare Other | Attending: Emergency Medicine | Admitting: Emergency Medicine

## 2020-04-26 DIAGNOSIS — I129 Hypertensive chronic kidney disease with stage 1 through stage 4 chronic kidney disease, or unspecified chronic kidney disease: Secondary | ICD-10-CM | POA: Insufficient documentation

## 2020-04-26 DIAGNOSIS — Z20822 Contact with and (suspected) exposure to covid-19: Secondary | ICD-10-CM | POA: Diagnosis not present

## 2020-04-26 DIAGNOSIS — I454 Nonspecific intraventricular block: Secondary | ICD-10-CM | POA: Diagnosis not present

## 2020-04-26 DIAGNOSIS — Z8616 Personal history of COVID-19: Secondary | ICD-10-CM | POA: Diagnosis not present

## 2020-04-26 DIAGNOSIS — Z7901 Long term (current) use of anticoagulants: Secondary | ICD-10-CM | POA: Insufficient documentation

## 2020-04-26 DIAGNOSIS — R531 Weakness: Secondary | ICD-10-CM | POA: Diagnosis present

## 2020-04-26 DIAGNOSIS — Z96652 Presence of left artificial knee joint: Secondary | ICD-10-CM | POA: Diagnosis not present

## 2020-04-26 DIAGNOSIS — I4510 Unspecified right bundle-branch block: Secondary | ICD-10-CM | POA: Diagnosis not present

## 2020-04-26 DIAGNOSIS — Z9013 Acquired absence of bilateral breasts and nipples: Secondary | ICD-10-CM | POA: Insufficient documentation

## 2020-04-26 DIAGNOSIS — Z79899 Other long term (current) drug therapy: Secondary | ICD-10-CM | POA: Insufficient documentation

## 2020-04-26 DIAGNOSIS — R001 Bradycardia, unspecified: Secondary | ICD-10-CM | POA: Diagnosis not present

## 2020-04-26 DIAGNOSIS — I4891 Unspecified atrial fibrillation: Secondary | ICD-10-CM | POA: Diagnosis not present

## 2020-04-26 DIAGNOSIS — N183 Chronic kidney disease, stage 3 unspecified: Secondary | ICD-10-CM | POA: Insufficient documentation

## 2020-04-26 DIAGNOSIS — Z743 Need for continuous supervision: Secondary | ICD-10-CM | POA: Diagnosis not present

## 2020-04-26 DIAGNOSIS — I499 Cardiac arrhythmia, unspecified: Secondary | ICD-10-CM | POA: Diagnosis not present

## 2020-04-26 LAB — CBC
HCT: 40.8 % (ref 36.0–46.0)
Hemoglobin: 13.3 g/dL (ref 12.0–15.0)
MCH: 31.7 pg (ref 26.0–34.0)
MCHC: 32.6 g/dL (ref 30.0–36.0)
MCV: 97.4 fL (ref 80.0–100.0)
Platelets: 217 10*3/uL (ref 150–400)
RBC: 4.19 MIL/uL (ref 3.87–5.11)
RDW: 13.1 % (ref 11.5–15.5)
WBC: 6.2 10*3/uL (ref 4.0–10.5)
nRBC: 0 % (ref 0.0–0.2)

## 2020-04-26 LAB — COMPREHENSIVE METABOLIC PANEL
ALT: 74 U/L — ABNORMAL HIGH (ref 0–44)
AST: 126 U/L — ABNORMAL HIGH (ref 15–41)
Albumin: 3.7 g/dL (ref 3.5–5.0)
Alkaline Phosphatase: 68 U/L (ref 38–126)
Anion gap: 5 (ref 5–15)
BUN: 18 mg/dL (ref 8–23)
CO2: 25 mmol/L (ref 22–32)
Calcium: 8.1 mg/dL — ABNORMAL LOW (ref 8.9–10.3)
Chloride: 104 mmol/L (ref 98–111)
Creatinine, Ser: 0.92 mg/dL (ref 0.44–1.00)
GFR calc Af Amer: >60 mL/min (ref >60)
GFR calc non Af Amer: 58 mL/min — ABNORMAL LOW (ref >60)
Glucose, Bld: 134 mg/dL — ABNORMAL HIGH (ref 70–99)
Potassium: 4.3 mmol/L (ref 3.5–5.1)
Sodium: 134 mmol/L — ABNORMAL LOW (ref 135–145)
Total Bilirubin: 1.1 mg/dL (ref 0.3–1.2)
Total Protein: 6.6 g/dL (ref 6.5–8.1)

## 2020-04-26 LAB — URINALYSIS, ROUTINE W REFLEX MICROSCOPIC
Bilirubin Urine: NEGATIVE
Glucose, UA: NEGATIVE mg/dL
Hgb urine dipstick: NEGATIVE
Ketones, ur: NEGATIVE mg/dL
Nitrite: NEGATIVE
Protein, ur: 30 mg/dL — AB
Specific Gravity, Urine: 1.017 (ref 1.005–1.030)
pH: 5 (ref 5.0–8.0)

## 2020-04-26 LAB — RESPIRATORY PANEL BY RT PCR (FLU A&B, COVID)
Influenza A by PCR: NEGATIVE
Influenza B by PCR: NEGATIVE
SARS Coronavirus 2 by RT PCR: NEGATIVE

## 2020-04-26 LAB — TROPONIN I (HIGH SENSITIVITY)
Troponin I (High Sensitivity): 12 ng/L (ref <18)
Troponin I (High Sensitivity): 12 ng/L (ref <18)

## 2020-04-26 LAB — LIPASE, BLOOD: Lipase: 26 U/L (ref 11–51)

## 2020-04-26 MED ORDER — ACETAMINOPHEN 325 MG PO TABS
650.0000 mg | ORAL_TABLET | Freq: Once | ORAL | Status: AC
  Administered 2020-04-26: 650 mg via ORAL
  Filled 2020-04-26: qty 2

## 2020-04-26 NOTE — ED Provider Notes (Signed)
[facility] Emergency Department Provider Note   ____________________________________________   First MD Initiated Contact with Patient 04/26/20 0730     (approximate)  I have reviewed the triage vital signs and the nursing notes.   HISTORY  Chief Complaint Weakness    HPI [name] [name] is a 84 y.o. female for evaluation of feeling fatigued with some slight nausea  Patient reports that yesterday she just felt a little bit nauseated cannot really describe it otherwise.  A little bit more fatigued than her typical.  Then this morning when she got up again still experiencing the symptoms of feeling nauseated and she checked her heart rate at home and found it was in the low 40s which was unusual prompting her to call 911.  She is able to walk, get herself dressed, not having any headaches.  No focal weakness.  No trouble speaking.  No chest pain.  No cough or recent infection except she had a cyst removed on the skin of her right upper back about 2 weeks ago and she completed Augmentin as a preventive after the procedure but reports that she is doing well and that is healing.  Continues on Eliquis and has not yet had her metoprolol or Cardizem this morning  No medication changes.  Follows with Dr. Fletcher Anon.  No pain or burning with urination.  No abdominal pain.   Past Medical History:  Diagnosis Date  . Arthritis   . Atrial flutter ([place]) 02/2011   s/p cardioversion   . Chest pain    a. H/o cardiac cath x 2-> 2012 -->nl cors;  b. 12/2016 MV: EF 61%, small region of mild perfusion defect in the apical anteroseptal region c/w breast attenuation, no ischemia-->Low risk; c. 06/2019 MV: Mod size, mild inflat ischemia, EF 59%. Cor and Ao Ca2+. Inflat defect more pronounced on this study compared to last; c. 07/2019 Cath: LM nl, LAD min irregs, LCX nl, OM1/2/3 nl, RCA min irregs.  . CKD (chronic kidney disease), stage III   . Cystocele   . GERD (gastroesophageal reflux  disease)   . Headache(784.0)    chronic  . Hyperlipidemia   . Hypertension   . Knee fracture   . Mobitz type 2 second degree atrioventricular block    a. felt to be 2/2 amiodarone, resolved with decreased amiodarone dose.  Amio since d/c'd.  . Permanent atrial fibrillation ([place])    a. status post multiple DCCVs; b. 2018 - eval for PVI but opted for rate control.  [name] PONV (postoperative nausea and vomiting)    oxycodone and codiene cause N/V   . Pre-syncope    a. In setting of dehydration and AKI in the past.  . Tachycardia induced cardiomyopathy ([place])    a. Resolved;  b. 08/2017 Echo: EF 50-55%, no rwma, mild MR, mildly to mod dil LA/RA; c. 02/2018 Echo: EF 55-60%, mild MR. Mildly dil LA. Nl RVSP. PASP 51mmHg.  [name] Venous insufficiency   . Vertigo     Patient Active Problem List   Diagnosis Date Noted  . Porokeratosis 02/21/2020  . Plantar flexed metatarsal bone of left foot 02/21/2020  . Prediabetes 12/31/2019  . Abnormal stress test   . Close exposure to COVID-19 virus 06/11/2019  . Elbow effusion, left 03/10/2019  . Headache 02/17/2019  . Pain of right scapula 08/01/2017  . Chest pain 07/22/2017  . Sinusitis 06/14/2017  . Influenza 01/10/2017  . Rash and nonspecific skin eruption 11/25/2016  . Concussion with no  loss of consciousness 09/01/2016  . Chronic venous insufficiency 09/01/2016  . Preoperative clearance 06/29/2016  . Other fatigue 06/29/2016  . Visit for preventive health examination 12/15/2015  . GERD (gastroesophageal reflux disease) 11/20/2015  . Cystocele 11/18/2015  . Vaginal atrophy 11/18/2015  . Chronic suprapubic pain 10/14/2015  . Inguinal hernia 10/13/2015  . Impaired fasting glucose 04/24/2015  . Pulmonary hypertension, moderate to severe ([place]) 04/24/2015  . Arthritis of knee, degenerative 02/21/2015  . Vitamin D deficiency 12/27/2014  . S/P TAH-BSO 12/13/2014  . S/P bilateral mastectomy 12/13/2014  . Long term current use of anticoagulant therapy  09/25/2014  . HH (hiatus hernia) 04/20/2014  . Tachycardia induced cardiomyopathy ([place]) 07/20/2013  . Unspecified vitamin D deficiency 04/24/2013  . Family history of breast cancer in sister 04/23/2013  . Medicare annual wellness visit, subsequent 04/23/2013  . Obesity 06/30/2012  . History of [place] spotted fever 06/22/2012  . Persistent atrial fibrillation ([place]) 05/09/2012  . Anxiety and depression 05/09/2012  . TACHYCARDIA 02/01/2011  . Hyperlipidemia 07/08/2010  . Essential hypertension 03/05/2009    Past Surgical History:  Procedure Laterality Date  . ABDOMINAL HYSTERECTOMY  1990  . APPENDECTOMY    . AUGMENTATION MAMMAPLASTY Bilateral 1986   implants  . AUGMENTATION MAMMAPLASTY  1990  . AUGMENTATION MAMMAPLASTY  2011  . CARDIAC CATHETERIZATION    . CARDIOVERSION     x 3  . CARDIOVERSION    . CARDIOVERSION N/A 02/07/2017   Procedure: CARDIOVERSION;  Surgeon: Wellington Hampshire, MD;  Location: [facility] OR[facility];  Service: Cardiovascular;  Laterality: N/A;  . CARDIOVERSION N/A 07/22/2017   Procedure: Cardioversion;  Surgeon: Minna Merritts, MD;  Location: [facility];  Service: Cardiovascular;  Laterality: N/A;  . CATARACT EXTRACTION W/PHACO Right 09/21/2016   Procedure: CATARACT EXTRACTION PHACO AND INTRAOCULAR LENS PLACEMENT ([place]);  Surgeon: Birder Robson, MD;  Location: [facility];  Service: Ophthalmology;  Laterality: Right;  [place] 44.1 AP% 16.5 CDE 7.30 Fluid Pack Lot [id number]  . CATARACT EXTRACTION W/PHACO Left 10/19/2016   Procedure: CATARACT EXTRACTION PHACO AND INTRAOCULAR LENS PLACEMENT (IOC);  Surgeon: Birder Robson, MD;  Location: [facility];  Service: Ophthalmology;  Laterality: Left;  [place] 53.7 AP% 19.5 CDE 10.45 Fluid pack lot # [id number]  . CHOLECYSTECTOMY    . COMBINED AUGMENTATION MAMMAPLASTY AND ABDOMINOPLASTY    . JOINT REPLACEMENT Left 06/04/2013   left knee  . KNEE ARTHROSCOPY Right 08/16/2016   Procedure: ARTHROSCOPY KNEE, tear posterior horn medial  meniscus, tear anterior and posterior horns of lateral meniscus, chondromalacia of lateral compartment grade 3 patella and grade 4 medial;  Surgeon: Dereck Leep, MD;  Location: [facility];  Service: Orthopedics;  Laterality: Right;  . LEFT HEART CATH AND CORONARY ANGIOGRAPHY Left 07/27/2019   Procedure: LEFT HEART CATH AND CORONARY ANGIOGRAPHY;  Surgeon: Wellington Hampshire, MD;  Location: [place] [facility];  Service: Cardiovascular;  Laterality: Left;  [name] MASTECTOMY  1986   nipple sparing mastectomy/Bilateral with silicone  breast implants, s/p saline replacements  . Multiple orthopedic procedures    . NOSE SURGERY    . TEE WITHOUT CARDIOVERSION N/A 09/26/2017   Procedure: TRANSESOPHAGEAL ECHOCARDIOGRAM (TEE);  Surgeon: Fay Records, MD;  Location: [place];  Service: Cardiovascular;  Laterality: N/A;  . TOTAL KNEE ARTHROPLASTY Left     Prior to Admission medications   Medication Sig Start Date End Date Taking? Authorizing Provider  acetaminophen (TYLENOL) 500 MG tablet Take 1,000 mg by mouth every 6 (six) hours as needed (pain).  [provider]  aluminum-magnesium hydroxide 200-200 MG/5ML suspension Take 20 mLs by mouth every 6 (six) hours as needed for indigestion.     [provider]  apixaban (ELIQUIS) 5 MG TABS tablet Take 1 tablet (5 mg total) by mouth 2 (two) times daily. 01/28/20   Minna Merritts, MD  Carboxymethylcellul-Glycerin (LUBRICATING EYE DROPS OP) Place 1 drop into both eyes daily as needed (dry eyes).    [provider]  cholecalciferol (VITAMIN D) 1000 units tablet Take 1,000 Units by mouth daily.    [provider]  Coenzyme Q10 (COQ10) 200 MG CAPS Take 200 mg by mouth daily.    [provider]  diltiazem (CARDIZEM CD) 180 MG 24 hr capsule Take 180 mg by mouth daily.    [provider]  metoprolol tartrate (LOPRESSOR) 50 MG tablet TAKE 1 TABLET BY MOUTH 3 TIMES DAILY 08/06/19   Wellington Hampshire, MD  pantoprazole  (PROTONIX) 40 MG tablet TAKE 1 TABLET EVERY DAY 01/24/20   Wellington Hampshire, MD  pravastatin (PRAVACHOL) 20 MG tablet TAKE 1 TABLET DAILY 02/18/20   [name], MD  Probiotic Product (PROBIOTIC ADVANCED PO) Take 1 tablet by mouth daily.    [provider]  sodium chloride (OCEAN) 0.65 % SOLN nasal spray Place 1 spray into both nostrils daily as needed for congestion.    [provider]    Allergies Iodine, Amiodarone, Biaxin [clarithromycin], Codeine, Digoxin and related, Enalapril, Fluocinonide, Iodinated diagnostic agents, Oxycodone, Promethazine, Tizanidine hcl, Warfarin and related, and Xarelto [rivaroxaban]  Family History  Problem Relation Age of Onset  . Heart disease Mother   . Cancer Father        stomach  . Heart disease Son        found at autopsy  . Cancer Sister        breast  . Breast cancer Sister   . Cancer Maternal Grandmother        breast  . Breast cancer Maternal Grandmother   . Cancer Sister        breast  . Breast cancer Sister   . Diabetes Brother   . Esophageal cancer Brother   . Ovarian cancer Neg Hx     Social History Social History   Tobacco Use  . Smoking status: Never Smoker  . Smokeless tobacco: Never Used  Substance Use Topics  . Alcohol use: No  . Drug use: No    Review of Systems Constitutional: No fever/chills just feeling a little bit generally fatigued from her normal.  Describes her self is very high energy and just has not felt high energy for the last day. Eyes: No visual changes. ENT: No sore throat. Cardiovascular: Denies chest pain. Respiratory: Denies shortness of breath. Gastrointestinal: No abdominal pain.   Genitourinary: Negative for dysuria. Musculoskeletal: Negative for back pain. Skin: Negative for rash. Neurological: Negative for headaches, areas of focal weakness or numbness.    ____________________________________________   PHYSICAL EXAM:  VITAL SIGNS: ED Triage Vitals  Enc Vitals  Group     BP 04/26/20 0739 (!) 134/57     Pulse Rate 04/26/20 0739 61     Resp 04/26/20 0739 20     Temp 04/26/20 0739 98.2 F (36.8 C)     Temp Source 04/26/20 0739 Oral     SpO2 04/26/20 0739 99 %     Weight 04/26/20 0735 165 lb 5.5 oz (75 kg)     Height 04/26/20 0735 5\' 4"  (1.626  m)     Head Circumference --      Peak Flow --      Pain Score 04/26/20 0738 0     Pain Loc --      Pain Edu? --      Excl. in [place]? --     Constitutional: Alert and oriented. Well appearing and in no acute distress. Eyes: Conjunctivae are normal. Head: Atraumatic. Nose: No congestion/rhinnorhea. Mouth/Throat: Mucous membranes are slightly dry. Neck: No stridor.  Cardiovascular: Normal rate, irregular rhythm. Grossly normal heart sounds.  Good peripheral circulation. Respiratory: Normal respiratory effort.  No retractions. Lungs CTAB. Gastrointestinal: Soft and nontender. No distention. Musculoskeletal: No lower extremity tenderness nor edema. Neurologic:  Normal speech and language. No gross focal neurologic deficits are appreciated.  She moves all of her extremities well without defect.  Her speech is clear. Skin:  Skin is warm, dry and intact. No rash noted. Psychiatric: Mood and affect are normal. Speech and behavior are normal.  ____________________________________________   LABS (all labs ordered are listed, but only abnormal results are displayed)  Labs Reviewed  COMPREHENSIVE METABOLIC PANEL - Abnormal; Notable for the following components:      Result Value   Sodium 134 (*)    Glucose, Bld 134 (*)    Calcium 8.1 (*)    AST 126 (*)    ALT 74 (*)    GFR calc non Af Amer 58 (*)    All other components within normal limits  URINALYSIS, ROUTINE W REFLEX MICROSCOPIC - Abnormal; Notable for the following components:   Color, Urine YELLOW (*)    APPearance CLEAR (*)    Protein, ur 30 (*)    Leukocytes,Ua SMALL (*)    Bacteria, UA RARE (*)    All other components within normal limits   RESPIRATORY PANEL BY RT PCR (FLU A&B, COVID)  CBC  LIPASE, BLOOD  TROPONIN I (HIGH SENSITIVITY)  TROPONIN I (HIGH SENSITIVITY)   ____________________________________________  EKG  Reviewed inter by me at 740 Heart rate 65 QRs 150 QTc 430 Atrial fibrillation, rate bundle branch block.  Lateral T wave inversions also notable in inferior leads.  As compared with previous EKG from July 18, 2019, no significant changes noted. ____________________________________________  RADIOLOGY  No results found.   ____________________________________________   PROCEDURES  Procedure(s) performed: 3-lead  .1-3 Lead EKG Interpretation Performed by: Delman Kitten, MD Authorized by: Delman Kitten, MD     Interpretation: abnormal     ECG rate:  62   ECG rate assessment: normal     Rhythm: atrial fibrillation     Ectopy: none   Comments:     Rbbb. AfIB. Normal rate. Time 831am    Critical Care performed: No  ____________________________________________   INITIAL IMPRESSION / ASSESSMENT AND PLAN / ED COURSE  Pertinent labs & imaging results that were available during my care of the patient were reviewed by me and considered in my medical decision making (see chart for details).   Patient presents for generalized fatigue, also a slight feeling of nausea but she reports it feels better.  EMS found her bradycardic heart rate in the 40s, given 1/2 mg of atropine heart rate now in the 60s and she reports she feels less nauseated  Clinical Course as of Apr 26 1116  Sat Apr 26, 2020  0904 Discussed and updated patient on her test results.  She reports that she forgot but last night she took an increased dose of her diltiazem 240 mg  which her doctor just recently prescribed her.  She thinks that might be a potential cause.  I think in the context of awakening with nausea and bradycardia that is now resolved this is likely the etiology of her symptoms.  Urinalysis reassuring and she denies  symptoms of UTI.Have paged cardiology to discuss, and discussed with the patient and she will resume her normal 180 mg diltiazem and stop use of 240 tablet which I suspect may have led to some unintended bradycardia.  She feels much better, resting comfortably without concern or distress at this time   [MQ]    Clinical Course User Index [MQ] Delman Kitten, MD    ----------------------------------------- 11:16 AM on 04/26/2020 -----------------------------------------  Patient feels improved, troponin on reassessment is also normal.  Discussed with Dr. Harrell Gave of cardiology, she will have Dr. Cyndi Lennert office contact the patient Monday for follow-up.  Patient is doing well at this time, understands plan to discontinue use of her diltiazem 240 mg tablet and return to use of her 180mg  tablet once daily.  Return precautions and treatment recommendations and follow-up discussed with the patient who is agreeable with the plan.  ____________________________________________   FINAL CLINICAL IMPRESSION(S) / ED DIAGNOSES  Final diagnoses:  Bradycardia with 41-50 beats per minute        Note:  This document was prepared using Dragon voice recognition software and may include unintentional dictation errors       Delman Kitten, MD 04/26/20 1117

## 2020-04-26 NOTE — ED Notes (Signed)
Pt's husband out to the nursing station, pt reports that her headache has returned and it is moving across her forehead, nagging and increased to 6/10, Dr Jacqualine Code made aware

## 2020-04-26 NOTE — ED Provider Notes (Signed)
Patient reports to have a very mild slight frontal headache.  Reports this is not uncommon for her and she has frequent headaches that are worse in intensity on a regular basis.  Reports he feels like she does need something to eat, would still like to be discharged.  Discussed potentially ordering a CT of the head to evaluate for unlikely, but potential cause of headache and nausea could be bleeding such as spontaneous bleeding due to being on Eliquis versus other cause.  However with shared medical decision making involving patient and husband, will forego CT of the head.  She is very much intact neuro exam fully alert.  Normal vital signs.  Reports similar headaches in the past and I do not believe that imaging is strongly needed at this time.  Return precautions regarding headache and her symptoms today discussed with patient and husband both in agreement with plan.   Delman Kitten, MD 04/26/20 1216

## 2020-04-26 NOTE — ED Notes (Signed)
Dr Jacqualine Code to bedside

## 2020-04-26 NOTE — ED Triage Notes (Signed)
Pt arrives via [facility] from home for c/o dizziness and weakness for 2 weeks. Pt reports she has been taking an abx for a cyst removal on her back for 2 weeks and it "messed up her blood pressure" and made her "just not feel well". Pt A&Ox4 and in NAD. Pt HR 42 on EMS arrival and was given 1/2 mg atropine and 500 cc NS. Pt has hx of afib

## 2020-04-26 NOTE — Discharge Instructions (Signed)
Please discontinue use of you are diltiazem 240 mg tablet.  Please resume your normal medication schedule to use your diltiazem 180 mg tablet.  Follow-up closely with cardiology.  Return to the ER if you have worsening symptoms, fatigue, passout, develop a fever, pain including chest pain or abdominal pain, weakness, or other new concerns arise.

## 2020-04-28 ENCOUNTER — Encounter: Payer: Self-pay | Admitting: Cardiovascular Disease

## 2020-04-28 ENCOUNTER — Ambulatory Visit: Payer: Medicare Other | Admitting: Family

## 2020-04-28 ENCOUNTER — Ambulatory Visit: Payer: Medicare Other | Admitting: Dermatology

## 2020-04-28 ENCOUNTER — Telehealth: Payer: Self-pay | Admitting: Family

## 2020-04-28 ENCOUNTER — Telehealth: Payer: Self-pay | Admitting: Cardiovascular Disease

## 2020-04-28 ENCOUNTER — Other Ambulatory Visit: Payer: Self-pay | Admitting: Cardiovascular Disease

## 2020-04-28 ENCOUNTER — Encounter: Payer: Self-pay | Admitting: Family

## 2020-04-28 ENCOUNTER — Ambulatory Visit (INDEPENDENT_AMBULATORY_CARE_PROVIDER_SITE_OTHER): Payer: Medicare Other

## 2020-04-28 ENCOUNTER — Other Ambulatory Visit: Payer: Self-pay

## 2020-04-28 VITALS — BP 158/90 | HR 104 | Ht 63.5 in | Wt 165.2 lb

## 2020-04-28 DIAGNOSIS — L578 Other skin changes due to chronic exposure to nonionizing radiation: Secondary | ICD-10-CM | POA: Diagnosis not present

## 2020-04-28 DIAGNOSIS — I10 Essential (primary) hypertension: Secondary | ICD-10-CM | POA: Diagnosis not present

## 2020-04-28 DIAGNOSIS — I43 Cardiomyopathy in diseases classified elsewhere: Secondary | ICD-10-CM

## 2020-04-28 DIAGNOSIS — L820 Inflamed seborrheic keratosis: Secondary | ICD-10-CM | POA: Diagnosis not present

## 2020-04-28 DIAGNOSIS — R001 Bradycardia, unspecified: Secondary | ICD-10-CM

## 2020-04-28 DIAGNOSIS — Z7901 Long term (current) use of anticoagulants: Secondary | ICD-10-CM

## 2020-04-28 DIAGNOSIS — I4821 Permanent atrial fibrillation: Secondary | ICD-10-CM

## 2020-04-28 DIAGNOSIS — D1800 Hemangioma unspecified site: Secondary | ICD-10-CM

## 2020-04-28 DIAGNOSIS — L821 Other seborrheic keratosis: Secondary | ICD-10-CM | POA: Diagnosis not present

## 2020-04-28 DIAGNOSIS — R000 Tachycardia, unspecified: Secondary | ICD-10-CM | POA: Diagnosis not present

## 2020-04-28 MED ORDER — LOSARTAN POTASSIUM 25 MG PO TABS: 12.5000 mg | ORAL_TABLET | Freq: Every day | ORAL | 3 refills | Status: DC

## 2020-04-28 MED ORDER — PANTOPRAZOLE SODIUM 40 MG PO TBEC: 40.0000 mg | DELAYED_RELEASE_TABLET | Freq: Every day | ORAL | 2 refills | Status: DC

## 2020-04-28 MED ORDER — METOPROLOL TARTRATE 75 MG PO TABS: 75.0000 mg | ORAL_TABLET | Freq: Two times a day (BID) | ORAL | 3 refills | Status: DC

## 2020-04-28 NOTE — Patient Instructions (Addendum)
Medication Instructions:  Your physician has recommended you make the following change in your medication:   CHANGE Metoprolol to 75 mg (1.5 tablet) twice per day  If your heart rate is less than 60bpm would recommend holding your Metoprolol.   START Losartan 12.5mg  daily  CONTINUE Diltiazem (Cardizem) 180mg  at bedtime  *If you need a refill on your cardiac medications before your next appointment, please call your pharmacy*  Lab Work None ordered today. Lab work in the hospital showed normal electrolytes and no evidence of anemia.   Testing/Procedures: 1-Your EKG today shows normal sinus rhythm  2- A zio monitor was placed today. It will remain on for 14 days. You will then return monitor and event diary in provided box. It takes 1-2 weeks for report to be downloaded and returned to [place]. We will call you with the results. If monitor falls of or has orange flashing light, please call Zio for further instructions.     Follow-Up: At [facility], you and your health needs are our priority.  As part of our continuing mission to provide you with exceptional heart care, we have created designated Provider Care Teams.  These Care Teams include your primary Cardiologist (physician) and Advanced Practice Providers (APPs -  Physician Assistants and Nurse Practitioners) who all work together to provide you with the care you need, when you need it.  We recommend signing up for the patient portal called "MyChart".  Sign up information is provided on this After Visit Summary.  MyChart is used to connect with patients for Virtual Visits (Telemedicine).  Patients are able to view lab/test results, encounter notes, upcoming appointments, etc.  Non-urgent messages can be sent to your provider as well.   To learn more about what you can do with MyChart, go to [email or web address].    Your next appointment:  In 3-4 weeks with Dr. Fletcher Anon or APP  Other Instructions: Would recommend bringing your blood  pressure and heart rate log to your next visit.   Please bring your blood pressure cuff to your next office visit.   A good heart rate is 50 beats per minute to 100 beats per minute. We would expect it to go up if you are worried, stressed, have eaten recently, or exercised recently.    If your blood pressure is less than 110 on the top number, recommend holding your Losartan  If your heart rate is less than 60, recommend holding your Metoprolol.  If your blood pressure is consistently more than 130 on the top number, call our office to let us know.

## 2020-04-28 NOTE — Progress Notes (Signed)
Office Visit    Patient Name: [name] [name] Date of Encounter: 04/28/2020  Primary Care Provider:  [name], MD Primary Cardiologist:  [name], MD Electrophysiologist:  None   Chief Complaint    [name] [name] is a 84 y.o. female with a hx of permanent atrial fibrillation/flutter s/p multiple cardioversions in the past (total of 7), tachycardia induced cardiomyopathy which has since resolved presents today for follow-up after ED visit  Past Medical History    Past Medical History:  Diagnosis Date  . Arthritis   . Atrial flutter ([place]) 02/2011   s/p cardioversion   . Chest pain    a. H/o cardiac cath x 2-> 2012 -->nl cors;  b. 12/2016 MV: EF 61%, small region of mild perfusion defect in the apical anteroseptal region c/w breast attenuation, no ischemia-->Low risk; c. 06/2019 MV: Mod size, mild inflat ischemia, EF 59%. Cor and Ao Ca2+. Inflat defect more pronounced on this study compared to last; c. 07/2019 Cath: LM nl, LAD min irregs, LCX nl, OM1/2/3 nl, RCA min irregs.  . CKD (chronic kidney disease), stage III   . Cystocele   . GERD (gastroesophageal reflux disease)   . Headache(784.0)    chronic  . Hyperlipidemia   . Hypertension   . Knee fracture   . Mobitz type 2 second degree atrioventricular block    a. felt to be 2/2 amiodarone, resolved with decreased amiodarone dose.  Amio since d/c'd.  . Permanent atrial fibrillation ([name])    a. status post multiple DCCVs; b. 2018 - eval for PVI but opted for rate control.  [name] PONV (postoperative nausea and vomiting)    oxycodone and codiene cause N/V   . Pre-syncope    a. In setting of dehydration and AKI in the past.  . 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)    a. Resolved;  b. 08/2017 Echo: EF 50-55%, no rwma, mild MR, mildly to mod dil LA/RA; c. 02/2018 Echo: EF 55-60%, mild MR. Mildly dil LA. Nl RVSP. PASP 37mHg.  .Marland KitchenVenous insufficiency   . Vertigo    Past Surgical History:  Procedure Laterality Date  .  ABDOMINAL HYSTERECTOMY  1990  . APPENDECTOMY    . AUGMENTATION MAMMAPLASTY Bilateral 1986   implants  . AUGMENTATION MAMMAPLASTY  1990  . AUGMENTATION MAMMAPLASTY  2011  . CARDIAC CATHETERIZATION    . CARDIOVERSION     x 3  . CARDIOVERSION    . CARDIOVERSION N/A 02/07/2017   Procedure: CARDIOVERSION;  Surgeon: MWellington Hampshire MD;  Location: [facility] ORS;  Service: Cardiovascular;  Laterality: N/A;  . CARDIOVERSION N/A 07/22/2017   Procedure: Cardioversion;  Surgeon: GMinna Merritts MD;  Location: [facility];  Service: Cardiovascular;  Laterality: N/A;  . CATARACT EXTRACTION W/PHACO Right 09/21/2016   Procedure: CATARACT EXTRACTION PHACO AND INTRAOCULAR LENS PLACEMENT (IPence;  Surgeon: WBirder Robson MD;  Location: [facility];  Service: Ophthalmology;  Laterality: Right;  [place]44[place] AP% 16.5 CDE 7.30 Fluid Pack Lot [id number]  . CATARACT EXTRACTION W/PHACO Left 10/19/2016   Procedure: CATARACT EXTRACTION PHACO AND INTRAOCULAR LENS PLACEMENT (IOC);  Surgeon: WBirder Robson MD;  Location: [facility];  Service: Ophthalmology;  Laterality: Left;  [place]53.7 AP% 19.5 CDE 10.45 Fluid pack lot # [id number]  . CHOLECYSTECTOMY    . COMBINED AUGMENTATION MAMMAPLASTY AND ABDOMINOPLASTY    . JOINT REPLACEMENT Left 06/04/2013   left knee  . KNEE ARTHROSCOPY Right 08/16/2016   Procedure: ARTHROSCOPY KNEE, tear posterior horn medial meniscus, tear  anterior and posterior horns of lateral meniscus, chondromalacia of lateral compartment grade 3 patella and grade 4 medial;  Surgeon: Dereck Leep, MD;  Location: [facility];  Service: Orthopedics;  Laterality: Right;  . LEFT HEART CATH AND CORONARY ANGIOGRAPHY Left 07/27/2019   Procedure: LEFT HEART CATH AND CORONARY ANGIOGRAPHY;  Surgeon: Wellington Hampshire, MD;  Location: [facility];  Service: Cardiovascular;  Laterality: Left;  [name] MASTECTOMY  1986   nipple sparing mastectomy/Bilateral with silicone  breast implants, s/p saline replacements  . Multiple  orthopedic procedures    . NOSE SURGERY    . TEE WITHOUT CARDIOVERSION N/A 09/26/2017   Procedure: TRANSESOPHAGEAL ECHOCARDIOGRAM (TEE);  Surgeon: Fay Records, MD;  Location[facility];  Service: Cardiovascular;  Laterality: N/A;  . TOTAL KNEE ARTHROPLASTY Left     Allergies  Allergies  Allergen Reactions  . Iodine Anaphylaxis    NO PROBLEMS WITH BETADINE  . Amiodarone     Cannot remember   . Biaxin [Clarithromycin] Nausea And Vomiting  . Codeine Nausea And Vomiting  . Digoxin And Related Other (See Comments)    Fatigue, eye puffiness, hoarsness  . Enalapril Other (See Comments)    unknown  . Fluocinonide Other (See Comments)    Tingling sensation in head and redness to scalp.  . Iodinated Diagnostic Agents Other (See Comments)    Tachycardia  . Oxycodone Nausea And Vomiting  . Promethazine Other (See Comments)    Unknown   . Tizanidine Hcl Other (See Comments)    hypotension   . Warfarin And Related     Bleeding   . Xarelto [Rivaroxaban] Other (See Comments)    bleeding    History of Present Illness    [name] [name] is a 84 y.o. female with a hx of  permanent atrial fibrillation/flutter s/p multiple cardioversions in the past (total of 7), tachycardia induced cardiomyopathy which has since resolved.  She was last seen 04/03/2020 by Dr. Fletcher Anon.  She had cardiac catheterization twice in the past with no significant coronary artery disease.  Most recent cardioversion February 2018.  Carotid Doppler August 2018 showed mild nonobstructive disease in her left carotid artery.  Echocardiogram 02/2018 with LVEF 55 to 60%, mildly dilated left atrium and minimal pulmonary hypertension.  Most recent ischemic evaluation July 2020 with [facility] which showed possible inferolateral ischemia with normal LVEF.  Cardiac catheterization August 2020 with minimal irregularities with no evidence of obstructive coronary artery disease, normal ejection fraction, mildly elevated  LVEDP.  Her diltiazem was previously increased to 240 mg for blood pressure control.  However at last clinic visit 04/03/2020 she was noted to be taking and again reduced dose of 180 mg daily with good control of BP and heart rate.  She was recommended continue diltiazem 180 mg daily as well as metoprolol 50 mg 3 times daily.  She was seen in the ED 04/26/2020 for fatigue and slight nausea.  She had heart rate at home in the low 40s prompting her to call 911.  She did take diltiazem 240 mg instead of her regular 180 mg the night prior. Case discussed with Dr. Harrell Gave. This is presumed to be the cause of her bradycardia.  EKG from the ED is unfortunately not available for my review. Per ED documentation HR 41-50 bpm. Lab work was sent including K4.3, AST 126, ALT 74, creatinine 0.92, GFR 58, hemoglobin 13.3, HS-TropI 12 and 12.   Tells me her blood pressure and heart rate have been  labile. Takes her Metoprolol in the morning, about 6 hours later, and one before bed. Tells me she can notice her heart rate being elevated if she is resting.  We discussed optimal heart rate of less than 100 bpm.  We did discuss the heart rate and blood pressure will go up with things such as eating, activity, stress, worry.  She does check her blood pressure routinely at home but cannot recall readings today. Has been taking her Diltiazem 169m along with metoprolol 50 mg 3 times daily and reports no recurrent bradycardia.  Reports no shortness of breath nor dyspnea on exertion. Reports no chest pain, pressure, or tightness. No edema, orthopnea, PND.  EKGs/Labs/Other Studies Reviewed:   The following studies were reviewed today: [facility] 808-21-20 The left ventricular systolic function is normal.  LV end diastolic pressure is mildly elevated.  The left ventricular ejection fraction is 55-65% by visual estimate.   1.  Minimal irregularities with no evidence of obstructive coronary artery disease. 2.  Normal LV systolic  function and mildly elevated left ventricular end-diastolic pressure.   Recommendations: The chest pain is likely noncardiac.  Continue rate control and anticoagulation for atrial fibrillation.  Can resume Eliquis tomorrow morning.  Echo 02/2018 Left ventricle: The cavity size was normal. Systolic function was    normal. The estimated ejection fraction was in the range of 55%    to 60%. Wall motion was normal; there were no regional wall    motion abnormalities. The study is not technically sufficient to    allow evaluation of LV diastolic function.  - Mitral valve: There was mild regurgitation.  - Left atrium: The atrium was mildly dilated.  - Right ventricle: Systolic function was normal.  - Pulmonary arteries: Systolic pressure was mildly elevated. PA    peak pressure: 35 mm Hg (S).   EKG:  EKG is  ordered today.  The ekg ordered today demonstrates atrial fibrillation 104 bpm with known RBBB and stable inferior lateral T wave inversion.  Recent Labs: 04/26/2020: ALT 74; BUN 18; Creatinine, Ser 0.92; Hemoglobin 13.3; Platelets 217; Potassium 4.3; Sodium 134  Recent Lipid Panel    Component Value Date/Time   CHOL 153 12/20/2019 1035   TRIG 80.0 12/20/2019 1035   HDL 59.90 12/20/2019 1035   CHOLHDL 3 12/20/2019 1035   VLDL 16.0 12/20/2019 1035   LDLCALC 77 12/20/2019 1035    Home Medications   Current Meds  Medication Sig  . acetaminophen (TYLENOL) 500 MG tablet Take 1,000 mg by mouth every 6 (six) hours as needed (pain).   .Marland Kitchenaluminum-magnesium hydroxide 200-200 MG/5ML suspension Take 20 mLs by mouth every 6 (six) hours as needed for indigestion.   .Marland Kitchenapixaban (ELIQUIS) 5 MG TABS tablet Take 1 tablet (5 mg total) by mouth 2 (two) times daily.  . Carboxymethylcellul-Glycerin (LUBRICATING EYE DROPS OP) Place 1 drop into both eyes daily as needed (dry eyes).  . cholecalciferol (VITAMIN D) 1000 units tablet Take 1,000 Units by mouth daily.  . Coenzyme Q10 (COQ10) 200 MG CAPS Take 200  mg by mouth daily.  .Marland Kitchendiltiazem (CARDIZEM CD) 180 MG 24 hr capsule Take 180 mg by mouth daily.  . metoprolol tartrate 75 MG TABS Take 75 mg by mouth 2 (two) times daily.  . pantoprazole (PROTONIX) 40 MG tablet Take 1 tablet (40 mg total) by mouth daily.  . pravastatin (PRAVACHOL) 20 MG tablet TAKE 1 TABLET DAILY  . Probiotic Product (PROBIOTIC ADVANCED PO) Take 1 tablet by mouth  daily.  . sodium chloride (OCEAN) 0.65 % SOLN nasal spray Place 1 spray into both nostrils daily as needed for congestion.  . [DISCONTINUED] metoprolol tartrate (LOPRESSOR) 50 MG tablet TAKE 1 TABLET BY MOUTH 3 TIMES DAILY  . [DISCONTINUED] pantoprazole (PROTONIX) 40 MG tablet TAKE 1 TABLET EVERY DAY      Review of Systems    Review of Systems  Constitution: Negative for chills, fever and malaise/fatigue.  Cardiovascular: Positive for palpitations. Negative for chest pain, dyspnea on exertion, irregular heartbeat, leg swelling, near-syncope, orthopnea and syncope.  Respiratory: Negative for cough, shortness of breath and wheezing.   Gastrointestinal: Negative for melena, nausea and vomiting.  Genitourinary: Negative for hematuria.  Neurological: Negative for dizziness, light-headedness and weakness.  Psychiatric/Behavioral: The patient is nervous/anxious.    All other systems reviewed and are otherwise negative except as noted above.  Physical Exam    VS:  BP (!) 158/90 (BP Location: Left Arm, Patient Position: Sitting, Cuff Size: Normal)   Pulse (!) 104   Ht 5' 3.5" (1.613 m)   Wt 165 lb 4 oz (75 kg)   SpO2 96%   BMI 28.81 kg/m  , BMI Body mass index is 28.81 kg/m. GEN: Well nourished, well developed, in no acute distress. HEENT: normal. Neck: Supple, no JVD, carotid bruits, or masses. Cardiac: Irregularly irregular, tachycardic, no murmurs, rubs, or gallops. No clubbing, cyanosis, edema.  Radials/PT 2+ and equal bilaterally.  Respiratory:  Respirations regular and unlabored, clear to auscultation  bilaterally. GI: Soft, nontender, nondistended. MS: No deformity or atrophy. Skin: Warm and dry, no rash. Neuro:  Strength and sensation are intact. Psych: Normal affect.  Anxious.  Accessory Clinical Findings    ECG personally reviewed by me today -atrial fibrillation 104 bpm with stable RBBB and T wave inversion in inferior lateral leads.- no acute changes.  Assessment & Plan    1. Permanent atrial fibrillation -EKG today shows atrial fibrillation 104 bpm.  Her heart rate has routinely been greater than 100 bpm at recent clinic visits.  Tells me her heart rate starts elevating about 2 or 3 PM and that is when she takes her second dose of metoprolol.  She was previously on metoprolol 100 mg twice daily and felt more fatigued with this dosing.  Change Metoprolol Tartrate from 50 mg TID to 75 mg twice per day.  Hopefully will prevent midafternoon elevated heart rates.  Continue Diltiazem 180 mg daily at bedtime.  Discussed lability of both blood pressure and heart rate and encouraged her to rest prior to taking her readings.  Does not that she was recently on Augmentin after having a cyst removed from her back and she feels this "messed up her medications ".  Discussed that this is not routinely a proarrhythmic medication but the infection or stress over the infection could increase her heart rate. Anticipate her anxiety is contributory to elevated HR at times.   7-day ZIO placed today to determine rate control in the setting of permanent atrial fibrillation.  She had recent ED visit with bradycardia and still reports tachycardia episodes at home this will allow [place] to evaluate if she is a candidate for PPM.  2. Bradycardia - Recent ED visit 04/26/2020 for bradycardia with heart rate as low as 41 bpm by EMS after taking previous prescription for 240 mg of diltiazem.  No recurrence since returning to diltiazem 180 mg daily.  ZIO monitor placed, as above.   3. Chronic anticoagulation - Secondary  to atrial fibrillation.  CHA2DS2-VASc of at least 5 (agex2, gender, HTN, CHF).  Denies bleeding complications.  04/26/2020 hemoglobin 13.3.  Continue Eliquis 5 mg twice daily.  Does not meet criteria for reduced dosing at this time.  4. HTN -BP elevated in clinic today.  Previous clinic BPs have routinely been greater than goal of 130/80.  Monitors BP at home and report readings consistently greater than 130/80.  Diltiazem dose was previously increased to 240 mg for hypertension but we will not increase again due to bradycardia.  Start losartan 12.5 mg daily.  Be met 04/26/2020 with creatinine 0.92, GFR 58.  Repeat BMP at follow-up.  5. History of tachycardia induced cardiomyopathy -euvolemic and well compensated on exam.  Most recent EF 55%.  6. Left carotid calcification -carotid Doppler August 2018 with mild nonobstructive disease.  Continue statin.  No aspirin secondary to chronic anticoagulation.  Disposition: Follow up in 3 week(s) with Dr. Fletcher Anon or APP   Loel Dubonnet, NP 04/28/2020, 12:09 PM

## 2020-04-28 NOTE — Telephone Encounter (Signed)
I attempted to call the patient back. No answer- I left a detailed message on her voice mail (ok per DPR) that she may take metoprolol tartrate 50 mg- 1.5 tablets (75 mg) twice daily. So is ok to cut the tablets in half.   I asked that she call back with any further questions/ concerns.

## 2020-04-28 NOTE — Telephone Encounter (Signed)
Pt has OV with [facility] today pending refill.

## 2020-04-28 NOTE — Telephone Encounter (Signed)
Pt c/o medication issue:  1. Name of Medication: metoprolol   2. How are you currently taking this medication (dosage and times per day)?  Told to increase   3. Are you having a reaction (difficulty breathing--STAT)? No   4. What is your medication issue? Patient wants to make sure she can cut current pills and take 1.5 of those .  Please clarify

## 2020-04-28 NOTE — Telephone Encounter (Signed)
This encounter was created in error - please disregard.

## 2020-04-28 NOTE — Progress Notes (Signed)
   Follow-Up Visit   Subjective  [name] [name] is a 84 y.o. female who presents for the following: lesions (on the R cheek and temple that the patient is concerned about and would like checked. She has also noticed other lesions on the face).  The following portions of the chart were reviewed this encounter and updated as appropriate:  Tobacco  Allergies  Meds  Problems  Med Hx  Surg Hx  Fam Hx     Review of Systems:  No other skin or systemic complaints except as noted in HPI or Assessment and Plan.  Objective  Well appearing patient in no apparent distress; mood and affect are within normal limits.  A focused examination was performed including face. Relevant physical exam findings are noted in the Assessment and Plan.  Objective  R temple, R cheek (2): Erythematous keratotic or waxy stuck-on papule or plaque.   Assessment & Plan  Inflamed seborrheic keratosis (2) R temple, R cheek  Destruction of lesion - R temple, R cheek Complexity: simple   Destruction method: cryotherapy   Informed consent: discussed and consent obtained   Timeout:  patient name, date of birth, surgical site, and procedure verified Lesion destroyed using liquid nitrogen: Yes   Region frozen until ice ball extended beyond lesion: Yes   Outcome: patient tolerated procedure well with no complications   Post-procedure details: wound care instructions given    Seborrheic Keratoses - Stuck-on, waxy, tan-brown papules and plaques  - Discussed benign etiology and prognosis. - Observe - Call for any changes  Hemangiomas - Red papules - Discussed benign nature - Observe - Call for any changes  Actinic Damage - diffuse scaly erythematous macules with underlying dyspigmentation - Recommend daily broad spectrum sunscreen SPF 30+ to sun-exposed areas, reapply every 2 hours as needed.  - Call for new or changing lesions.  Return if symptoms worsen or fail to improve.   Luther Redo, CMA, am  acting as scribe for Sarina Ser, MD .  Documentation: I have reviewed the above documentation for accuracy and completeness, and I agree with the above.  Sarina Ser, MD

## 2020-04-29 ENCOUNTER — Telehealth: Payer: Self-pay | Admitting: Family

## 2020-04-29 MED ORDER — METOPROLOL TARTRATE 50 MG PO TABS: 50.0000 mg | ORAL_TABLET | Freq: Three times a day (TID) | ORAL | Status: DC

## 2020-04-29 NOTE — Telephone Encounter (Signed)
Routed to Dr.Arida to advise. °

## 2020-04-29 NOTE — Telephone Encounter (Signed)
-----   Message from Wellington Hampshire, MD sent at 04/28/2020  8:10 AM EDT ----- Regarding: RE: ER follow up Thanks Shelton Silvas.  Lattie Haw,  Please call the patient and get a status update.  Thanks  ----- Message ----- From: Buford Dresser, MD Sent: 04/26/2020  11:11 AM EDT To: Wellington Hampshire, MD Subject: ER follow up                                   This patient came in with nausea/bradycardia Fri night/Sat AM. HR was in the 89s. It seems as though her most recent diltiazem prescription was 240 mg instead of prior 180 mg. ER instructed her to go back to her 180 mg dilt, and I asked them to have her monitor her HR and hold metoprolol if her HR drops again. May just want to have someone give her a call Monday AM to see how she does and see if she needs any additional adjustments to meds.  Thanks! Bridgette

## 2020-04-29 NOTE — Telephone Encounter (Signed)
Spoke with the patient. Patient sts that she has had a headache and has been a little dizzy with the medication changes made at her 04/28/20 appt.  Patient made aware of Dr. Tyrell Antonio recommendation below. Patient agreeable with the plane and voiced appreciation for the call.   Patients medication list updated.

## 2020-04-29 NOTE — Telephone Encounter (Signed)
If she is having problems, she can stop losartan for now and switch metoprolol back to 50 mg 3 times daily.

## 2020-04-29 NOTE — Telephone Encounter (Signed)
Patient would like to discuss Losartan and switching back to Metoprolol doseage. Patient states that she has had a headache all day.  She is seeing Dr. Fletcher Anon on 5/18 , as she wishes to discuss in person her medication concerns.

## 2020-05-06 ENCOUNTER — Other Ambulatory Visit: Payer: Self-pay

## 2020-05-06 ENCOUNTER — Encounter: Payer: Self-pay | Admitting: Cardiovascular Disease

## 2020-05-06 ENCOUNTER — Ambulatory Visit (INDEPENDENT_AMBULATORY_CARE_PROVIDER_SITE_OTHER): Payer: Medicare Other | Admitting: Cardiovascular Disease

## 2020-05-06 VITALS — BP 144/88 | HR 86 | Ht 63.0 in | Wt 164.0 lb

## 2020-05-06 DIAGNOSIS — I10 Essential (primary) hypertension: Secondary | ICD-10-CM

## 2020-05-06 DIAGNOSIS — I4821 Permanent atrial fibrillation: Secondary | ICD-10-CM | POA: Diagnosis not present

## 2020-05-06 MED ORDER — METOPROLOL TARTRATE 50 MG PO TABS: 50.0000 mg | ORAL_TABLET | Freq: Two times a day (BID) | ORAL | 2 refills | Status: DC

## 2020-05-06 MED ORDER — DILTIAZEM HCL ER COATED BEADS 180 MG PO CP24: 180.0000 mg | ORAL_CAPSULE | Freq: Every morning | ORAL | Status: DC

## 2020-05-06 NOTE — Progress Notes (Signed)
Cardiology Office Note   Date:  05/06/2020   ID:  [name] [name], DOB 05-23-1936, MRN [name]  PCP:  [name] Mc, MD  Cardiologist:   [name] Sacramento, MD   Chief Complaint  Patient presents with  . office visit    Pt has some concerns w/ meds/ swelling/ dizziness at times states due to meds/ SOB. Meds verbally reviewed w/ pt.       History of Present Illness: [name] [name] is a 84 y.o. female who presents for a followup visit regarding permanent atrial fibrillation.   She has known history of  atrial fibrillation/flutter status post multiple cardioversions in the past (total of 7). 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  Most recent cardioversion was in Feb of 2018. Echocardiogram in March, 2019 showed an EF of 55 to 60% with mildly dilated left atrium and minimal pulmonary hypertension. She is being treated with rate control at the present time.   Most recent ischemic cardiac evaluation was in July 2020 which included a Lexiscan Myoview which showed possible inferolateral ischemia with normal ejection fraction.  Cardiac catheterization was followed in August which showed minimal irregularities with no evidence of obstructive coronary artery disease, normal ejection fraction mildly elevated left ventricular end-diastolic pressure.  She had an emergency room visit recently due to fatigue and nausea.  Heart rate was in the 40s and thus she called 911.  She was taking diltiazem 240 mg daily instead of the regular 180 mg daily. The dose of diltiazem was decreased again to 180 mg daily.  She saw [name] [name] recently and metoprolol was changed from 50 mg 3 times daily to 75 mg twice daily.  Blood pressure was elevated and thus losartan 12.5 mg daily was added.  She did not feel well with losartan and stopped the medication.  She has been extremely anxious. She has been checking her blood pressure and heart rate frequently at home and takes  diltiazem and metoprolol according to these readings.  Sometimes she does not take these medications if the heart rate is in the 60s.  Past Medical History:  Diagnosis Date  . Arthritis   . Atrial flutter ([place]) 02/2011   s/p cardioversion   . Chest pain    a. H/o cardiac cath x 2-> 2012 -->nl cors;  b. 12/2016 MV: EF 61%, small region of mild perfusion defect in the apical anteroseptal region c/w breast attenuation, no ischemia-->Low risk; c. 06/2019 MV: Mod size, mild inflat ischemia, EF 59%. Cor and Ao Ca2+. Inflat defect more pronounced on this study compared to last; c. 07/2019 Cath: LM nl, LAD min irregs, LCX nl, OM1/2/3 nl, RCA min irregs.  . CKD (chronic kidney disease), stage III   . Cystocele   . GERD (gastroesophageal reflux disease)   . Headache(784.0)    chronic  . Hyperlipidemia   . Hypertension   . Knee fracture   . Mobitz type 2 second degree atrioventricular block    a. felt to be 2/2 amiodarone, resolved with decreased amiodarone dose.  Amio since d/c'd.  . Permanent atrial fibrillation ([place])    a. status post multiple DCCVs; b. 2018 - eval for PVI but opted for rate control.  [name] PONV (postoperative nausea and vomiting)    oxycodone and codiene cause N/V   . Pre-syncope    a. In setting of dehydration and AKI in the past.  . 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 Resolved;  b.  08/2017 Echo: EF 50-55%, no rwma, mild MR, mildly to mod dil LA/RA; c. 02/2018 Echo: EF 55-60%, mild MR. Mildly dil LA. Nl RVSP. PASP 7mmHg.  [name] Venous insufficiency   . Vertigo     Past Surgical History:  Procedure Laterality Date  . ABDOMINAL HYSTERECTOMY  1990  . APPENDECTOMY    . AUGMENTATION MAMMAPLASTY Bilateral 1986   implants  . AUGMENTATION MAMMAPLASTY  1990  . AUGMENTATION MAMMAPLASTY  2011  . CARDIAC CATHETERIZATION    . CARDIOVERSION     x 3  . CARDIOVERSION    . CARDIOVERSION N/A 02/07/2017   Procedure: CARDIOVERSION;  Surgeon: [name] Hampshire, MD;  Location: [facility];   Service: Cardiovascular;  Laterality: N/A;  . CARDIOVERSION N/A 07/22/2017   Procedure: Cardioversion;  Surgeon: [name] Merritts, MD;  Location: [facility];  Service: Cardiovascular;  Laterality: N/A;  . CATARACT EXTRACTION W/PHACO Right 09/21/2016   Procedure: CATARACT EXTRACTION PHACO AND INTRAOCULAR LENS PLACEMENT ([place]);  Surgeon: [name] Robson, MD;  Location: [facility];  Service: Ophthalmology;  Laterality: Right;  [place] 44.1 AP% 16.5 CDE 7.30 Fluid Pack Lot [id number]  . CATARACT EXTRACTION W/PHACO Left 10/19/2016   Procedure: CATARACT EXTRACTION PHACO AND INTRAOCULAR LENS PLACEMENT (IOC);  Surgeon: [name] Robson, MD;  Location: [facility];  Service: Ophthalmology;  Laterality: Left;  [place] 53.7 AP% 19.5 CDE 10.45 Fluid pack lot # [id number]  . CHOLECYSTECTOMY    . COMBINED AUGMENTATION MAMMAPLASTY AND ABDOMINOPLASTY    . JOINT REPLACEMENT Left 06/04/2013   left knee  . KNEE ARTHROSCOPY Right 08/16/2016   Procedure: ARTHROSCOPY KNEE, tear posterior horn medial meniscus, tear anterior and posterior horns of lateral meniscus, chondromalacia of lateral compartment grade 3 patella and grade 4 medial;  Surgeon: Dereck Leep, MD;  Location: [facility];  Service: Orthopedics;  Laterality: Right;  . LEFT HEART CATH AND CORONARY ANGIOGRAPHY Left 07/27/2019   Procedure: LEFT HEART CATH AND CORONARY ANGIOGRAPHY;  Surgeon: [name] Hampshire, MD;  Location: [facility];  Service: Cardiovascular;  Laterality: Left;  [name] MASTECTOMY  1986   nipple sparing mastectomy/Bilateral with silicone  breast implants, s/p saline replacements  . Multiple orthopedic procedures    . NOSE SURGERY    . TEE WITHOUT CARDIOVERSION N/A 09/26/2017   Procedure: TRANSESOPHAGEAL ECHOCARDIOGRAM (TEE);  Surgeon: [name] Records, MD;  Location: [facility];  Service: Cardiovascular;  Laterality: N/A;  . TOTAL KNEE ARTHROPLASTY Left      Current Outpatient Medications  Medication Sig Dispense Refill  . acetaminophen (TYLENOL)  500 MG tablet Take 1,000 mg by mouth every 6 (six) hours as needed (pain).     [name] aluminum-magnesium hydroxide 200-200 MG/5ML suspension Take 20 mLs by mouth every 6 (six) hours as needed for indigestion.     [name] apixaban (ELIQUIS) 5 MG TABS tablet Take 1 tablet (5 mg total) by mouth 2 (two) times daily. 60 tablet 5  . Carboxymethylcellul-Glycerin (LUBRICATING EYE DROPS OP) Place 1 drop into both eyes daily as needed (dry eyes).    . cholecalciferol (VITAMIN D) 1000 units tablet Take 1,000 Units by mouth daily.    . Coenzyme Q10 (COQ10) 200 MG CAPS Take 200 mg by mouth daily.    [name] diltiazem (CARDIZEM CD) 180 MG 24 hr capsule Take 180 mg by mouth daily.    . metoprolol tartrate (LOPRESSOR) 50 MG tablet Take 1 tablet (50 mg total) by mouth in the morning, at noon, and at bedtime.    . pantoprazole (PROTONIX) 40 MG  tablet TAKE 1 TABLET EVERY DAY 30 tablet 2  . pantoprazole (PROTONIX) 40 MG tablet Take 1 tablet (40 mg total) by mouth daily. 30 tablet 2  . pravastatin (PRAVACHOL) 20 MG tablet TAKE 1 TABLET DAILY 90 tablet 0  . Probiotic Product (PROBIOTIC ADVANCED PO) Take 1 tablet by mouth daily.    . sodium chloride (OCEAN) 0.65 % SOLN nasal spray Place 1 spray into both nostrils daily as needed for congestion.     No current facility-administered medications for this visit.    Allergies:   Iodine, Amiodarone, Biaxin [clarithromycin], Codeine, Digoxin and related, Enalapril, Fluocinonide, Iodinated diagnostic agents, Oxycodone, Promethazine, Tizanidine hcl, Warfarin and related, and Xarelto [rivaroxaban]    Social History:  The patient  reports that she has never smoked. She has never used smokeless tobacco. She reports that she does not drink alcohol or use drugs.   Family History:  The patient's family history includes Breast cancer in her maternal grandmother, sister, and sister; Cancer in her father, maternal grandmother, sister, and sister; Diabetes in her brother; Esophageal cancer in her  brother; Heart disease in her mother and son.    ROS:  Please see the history of present illness.   Otherwise, review of systems are positive for none.   All other systems are reviewed and negative.    PHYSICAL EXAM: VS:  BP (!) 144/88 (BP Location: Left Arm, Patient Position: Sitting, Cuff Size: Normal)   Pulse 86   Ht 5\' 3"  (1.6 m)   Wt 164 lb (74.4 kg)   SpO2 97%   BMI 29.05 kg/m  , BMI Body mass index is 29.05 kg/m. GEN: Well nourished, well developed, in no acute distress  HEENT: Left temporal lobe tenderness Neck: no JVD, carotid bruits, or masses Cardiac: Irregularly irregular; no murmurs, rubs, or gallops,no edema  Respiratory:  clear to auscultation bilaterally, normal work of breathing GI: soft, nontender, nondistended, + BS MS: no deformity or atrophy  Skin: warm and dry, no rash Neuro:  Strength and sensation are intact Psych: euthymic mood, full affect Right radial pulses normal with no hematoma  EKG:  EKG is ordered today. The ekg ordered today demonstrates atrial fibrillation with right bundle branch block.  Ventricular rate is 86 bpm  Recent Labs: 04/26/2020: ALT 74; BUN 18; Creatinine, Ser 0.92; Hemoglobin 13.3; Platelets 217; Potassium 4.3; Sodium 134    Lipid Panel    Component Value Date/Time   CHOL 153 12/20/2019 1035   TRIG 80.0 12/20/2019 1035   HDL 59.90 12/20/2019 1035   CHOLHDL 3 12/20/2019 1035   VLDL 16.0 12/20/2019 1035   LDLCALC 77 12/20/2019 1035      Wt Readings from Last 3 Encounters:  05/06/20 164 lb (74.4 kg)  04/28/20 165 lb 4 oz (75 kg)  04/26/20 165 lb 5.5 oz (75 kg)       ASSESSMENT AND PLAN:  1.  Permanent atrial fibrillation: The patient is having significant fluctuation in heart rate and blood pressure.  I think this is mostly related to her frequent checking of heart rate and blood pressure and holding the medications when the heart rate is in the 60s.  I explained to her that we have to achieve a steady state of these  medications and avoid holding medications.  To simplify her medications, I asked her to take diltiazem extended release 180 mg every day in the morning and metoprolol tartrate 50 mg twice daily.  I explained to her that the normal heart range for  her is between 50 to 100 bpm at rest.  1 consideration would be to switch metoprolol tartrate to metoprolol succinate to simplify her medications.   She is tolerating anticoagulation with Eliquis.   2. Essential hypertension: Blood pressure is mildly elevated but I suspect that stress and anxiety is contributing.  She did not tolerate losartan.  3. History of tachycardia-induced cardiomyopathy. Most recent ejection fraction was 55%.  4. Left carotid calcifications: Carotid Doppler in August of 2018 showed mild nonobstructive disease.     Disposition:   FU with me in 4 months     Signed, [name] Sacramento, MD 05/06/20 [place], [place]

## 2020-05-06 NOTE — Patient Instructions (Signed)
Medication Instructions:  Your physician has recommended you make the following change in your medication:   DECREASE Metoprolol to 50 mg twice daily.  Take Diltiazem 180 mg every morning.   *If you need a refill on your cardiac medications before your next appointment, please call your pharmacy*   Lab Work: None ordered If you have labs (blood work) drawn today and your tests are completely normal, you will receive your results only by: [name] MyChart Message (if you have MyChart) OR . A paper copy in the mail If you have any lab test that is abnormal or we need to change your treatment, we will call you to review the results.   Testing/Procedures: None ordered   Follow-Up: At [facility], you and your health needs are our priority.  As part of our continuing mission to provide you with exceptional heart care, we have created designated Provider Care Teams.  These Care Teams include your primary Cardiologist (physician) and Advanced Practice Providers (APPs -  Physician Assistants and Nurse Practitioners) who all work together to provide you with the care you need, when you need it.  We recommend signing up for the patient portal called "MyChart".  Sign up information is provided on this After Visit Summary.  MyChart is used to connect with patients for Virtual Visits (Telemedicine).  Patients are able to view lab/test results, encounter notes, upcoming appointments, etc.  Non-urgent messages can be sent to your provider as well.   To learn more about what you can do with MyChart, go to [email or web address].    Your next appointment:   4 month(s)  The format for your next appointment:   In Person  Provider:    You may see [name], MD or one of the following Advanced Practice Providers on your designated Care Team:    Murray Hodgkins, NP  Christell Faith, PA-C  Marrianne Mood, PA-C    Other Instructions N/A

## 2020-05-13 ENCOUNTER — Other Ambulatory Visit: Payer: Self-pay | Admitting: Cardiovascular Disease

## 2020-05-13 ENCOUNTER — Encounter: Payer: Self-pay | Admitting: Dermatology

## 2020-05-15 ENCOUNTER — Other Ambulatory Visit: Payer: Self-pay

## 2020-05-15 ENCOUNTER — Ambulatory Visit (INDEPENDENT_AMBULATORY_CARE_PROVIDER_SITE_OTHER): Payer: Medicare Other | Admitting: Internal Medicine

## 2020-05-15 VITALS — BP 124/78 | HR 115 | Temp 97.5°F | Wt 162.8 lb

## 2020-05-15 DIAGNOSIS — R7301 Impaired fasting glucose: Secondary | ICD-10-CM | POA: Diagnosis not present

## 2020-05-15 DIAGNOSIS — R000 Tachycardia, unspecified: Secondary | ICD-10-CM

## 2020-05-15 DIAGNOSIS — I43 Cardiomyopathy in diseases classified elsewhere: Secondary | ICD-10-CM

## 2020-05-15 DIAGNOSIS — I4819 Other persistent atrial fibrillation: Secondary | ICD-10-CM

## 2020-05-15 DIAGNOSIS — I10 Essential (primary) hypertension: Secondary | ICD-10-CM | POA: Diagnosis not present

## 2020-05-15 DIAGNOSIS — I428 Other cardiomyopathies: Secondary | ICD-10-CM

## 2020-05-15 DIAGNOSIS — E7800 Pure hypercholesterolemia, unspecified: Secondary | ICD-10-CM

## 2020-05-15 DIAGNOSIS — E559 Vitamin D deficiency, unspecified: Secondary | ICD-10-CM

## 2020-05-15 DIAGNOSIS — R5383 Other fatigue: Secondary | ICD-10-CM

## 2020-05-15 DIAGNOSIS — R7303 Prediabetes: Secondary | ICD-10-CM

## 2020-05-15 LAB — COMPREHENSIVE METABOLIC PANEL
ALT: 18 U/L (ref 0–35)
AST: 19 U/L (ref 0–37)
Albumin: 4.3 g/dL (ref 3.5–5.2)
Alkaline Phosphatase: 68 U/L (ref 39–117)
BUN: 16 mg/dL (ref 6–23)
CO2: 28 mEq/L (ref 19–32)
Calcium: 9.6 mg/dL (ref 8.4–10.5)
Chloride: 100 mEq/L (ref 96–112)
Creatinine, Ser: 0.78 mg/dL (ref 0.40–1.20)
GFR: 70.65 mL/min (ref >60.00)
Glucose, Bld: 104 mg/dL — ABNORMAL HIGH (ref 70–99)
Potassium: 4.7 mEq/L (ref 3.5–5.1)
Sodium: 133 mEq/L — ABNORMAL LOW (ref 135–145)
Total Bilirubin: 1 mg/dL (ref 0.2–1.2)
Total Protein: 6.7 g/dL (ref 6.0–8.3)

## 2020-05-15 LAB — VITAMIN D 25 HYDROXY (VIT D DEFICIENCY, FRACTURES): VITD: 34.51 ng/mL (ref 30.00–100.00)

## 2020-05-15 LAB — CBC WITH DIFFERENTIAL/PLATELET
Basophils Absolute: 0.1 10*3/uL (ref 0.0–0.1)
Basophils Relative: 0.9 % (ref 0.0–3.0)
Eosinophils Absolute: 0 10*3/uL (ref 0.0–0.7)
Eosinophils Relative: 0.6 % (ref 0.0–5.0)
HCT: 41.4 % (ref 36.0–46.0)
Hemoglobin: 13.7 g/dL (ref 12.0–15.0)
Lymphocytes Relative: 16.3 % (ref 12.0–46.0)
Lymphs Abs: 1.1 10*3/uL (ref 0.7–4.0)
MCHC: 33 g/dL (ref 30.0–36.0)
MCV: 97.1 fl (ref 78.0–100.0)
Monocytes Absolute: 0.4 10*3/uL (ref 0.1–1.0)
Monocytes Relative: 6.2 % (ref 3.0–12.0)
Neutro Abs: 5.3 10*3/uL (ref 1.4–7.7)
Neutrophils Relative %: 76 % (ref 43.0–77.0)
Platelets: 254 10*3/uL (ref 150.0–400.0)
RBC: 4.27 Mil/uL (ref 3.87–5.11)
RDW: 13 % (ref 11.5–15.5)
WBC: 7 10*3/uL (ref 4.0–10.5)

## 2020-05-15 LAB — TSH: TSH: 0.55 u[IU]/mL (ref 0.35–4.50)

## 2020-05-15 LAB — LIPID PANEL
Cholesterol: 139 mg/dL (ref 0–200)
HDL: 53.8 mg/dL (ref >39.00)
LDL Cholesterol: 70 mg/dL (ref 0–99)
NonHDL: 85.5
Total CHOL/HDL Ratio: 3
Triglycerides: 78 mg/dL (ref 0.0–149.0)
VLDL: 15.6 mg/dL (ref 0.0–40.0)

## 2020-05-15 LAB — HEMOGLOBIN A1C: Hgb A1c MFr Bld: 6.2 % (ref 4.6–6.5)

## 2020-05-16 ENCOUNTER — Telehealth: Payer: Self-pay | Admitting: Internal Medicine

## 2020-05-16 NOTE — Telephone Encounter (Signed)
Pt returned your call would like a call back.

## 2020-05-17 ENCOUNTER — Encounter: Payer: Self-pay | Admitting: Internal Medicine

## 2020-05-17 NOTE — Assessment & Plan Note (Signed)
Fasting glucose of 112 and a1c has dropped to 6.2 .  Addressed sedentary lifestyle and diet today.  Repeat a1c in 6 months   Lab Results  Component Value Date   HGBA1C 6.2 05/15/2020

## 2020-05-17 NOTE — Assessment & Plan Note (Addendum)
Managed with metoprolol 50 mg bid  Advised to increase evening dose to 75 mg.  Continue Eliquis

## 2020-05-17 NOTE — Progress Notes (Signed)
Subjective:  Patient ID: [name] [name], female    DOB: June 02, 1936  Age: 84 y.o. MRN: [id number]  CC: The primary encounter diagnosis was Essential hypertension. 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  HPI [name] [name] presents for follow up on chronic conditions  This visit occurred during the SARS-CoV-2 public health emergency.  Safety protocols were in place, including screening questions prior to the visit, additional usage of staff PPE, and extensive cleaning of exam room while observing appropriate contact time as indicated for disinfecting solutions.    Patient has received both doses of the available COVID 19 vaccine without complications.  Patient continues to mask when outside of the home except when walking in yard or at safe distances from others .  Patient denies any change in mood or development of unhealthy behaviors resuting from the pandemic's restriction of activities and socialization.    Patient is taking her medications as prescribed and notes no adverse effects.  Amlodipine was changed to diltiazem 180 mg  Home BP readings have been done daily and are  generally < 130/80 .  She is avoiding added salt in her diet and not  walking regularly or doing any exercises   Decreased memory , feels scatterbrained,  Absent minded, but always remembers what she forgot.    Outpatient Medications Prior to Visit  Medication Sig Dispense Refill  . acetaminophen (TYLENOL) 500 MG tablet Take 1,000 mg by mouth every 6 (six) hours as needed (pain).     [name] aluminum-magnesium hydroxide 200-200 MG/5ML suspension Take 20 mLs by mouth every 6 (six) hours as needed for indigestion.     [name] apixaban (ELIQUIS) 5 MG TABS tablet Take 1 tablet (5 mg total) by mouth 2 (two) times daily. 60 tablet 5  .  Carboxymethylcellul-Glycerin (LUBRICATING EYE DROPS OP) Place 1 drop into both eyes daily as needed (dry eyes).    . cholecalciferol (VITAMIN D) 1000 units tablet Take 1,000 Units by mouth daily.    . Coenzyme Q10 (COQ10) 200 MG CAPS Take 200 mg by mouth daily.    [name] diltiazem (CARDIZEM CD) 180 MG 24 hr capsule Take 1 capsule (180 mg total) by mouth every evening. 90 capsule 0  . metoprolol tartrate (LOPRESSOR) 50 MG tablet Take 1 tablet (50 mg total) by mouth 2 (two) times daily. 60 tablet 2  . pantoprazole (PROTONIX) 40 MG tablet Take 1 tablet (40 mg total) by mouth daily. 30 tablet 2  . pravastatin (PRAVACHOL) 20 MG tablet TAKE 1 TABLET DAILY 90 tablet 0  . Probiotic Product (PROBIOTIC ADVANCED PO) Take 1 tablet by mouth daily.    . sodium chloride (OCEAN) 0.65 % SOLN nasal spray Place 1 spray into both nostrils daily as needed for congestion.    . pantoprazole (PROTONIX) 40 MG tablet TAKE 1 TABLET EVERY DAY (Patient not taking: Reported on 05/15/2020) 30 tablet 2   No facility-administered medications prior to visit.    Review of Systems;  Patient denies headache, fevers, malaise, unintentional weight loss, skin rash, eye pain, sinus congestion and sinus pain, sore throat, dysphagia,  hemoptysis , cough, dyspnea, wheezing, chest pain, palpitations, orthopnea, edema, abdominal pain, nausea, melena, diarrhea, constipation, flank pain, dysuria, hematuria, urinary  Frequency, nocturia, numbness, tingling, seizures,  Focal weakness, Loss of consciousness,  Tremor, insomnia, depression, anxiety, and suicidal ideation.      Objective:  BP 124/78   Pulse (!) 115   Temp (!) 97.5 F (36.4 C)   Wt 162 lb 12.8 oz (73.8 kg)   BMI 28.84 kg/m   BP Readings from Last 3 Encounters:  05/17/20 124/78  05/06/20 (!) 144/88  04/28/20 (!) 158/90    Wt Readings from Last 3 Encounters:  05/17/20 162 lb 12.8 oz (73.8 kg)  05/06/20 164 lb (74.4 kg)  04/28/20 165 lb 4 oz (75 kg)    General appearance:  alert, cooperative and appears stated age Ears: normal TM's and external ear canals both ears Throat: lips, mucosa, and tongue normal; teeth and gums normal Neck: no adenopathy, no carotid bruit, supple, symmetrical, trachea midline and thyroid not enlarged, symmetric, no tenderness/mass/nodules Back: symmetric, no curvature. ROM normal. No CVA tenderness. Lungs: clear to auscultation bilaterally Heart: regular rate and rhythm, S1, S2 normal, no murmur, click, rub or gallop Abdomen: soft, non-tender; bowel sounds normal; no masses,  no organomegaly Pulses: 2+ and symmetric Skin: Skin color, texture, turgor normal. No rashes or lesions Lymph nodes: Cervical, supraclavicular, and axillary nodes normal.  Lab Results  Component Value Date   HGBA1C 6.2 05/15/2020   HGBA1C 6.4 12/20/2019   HGBA1C 5.7 04/08/2017    Lab Results  Component Value Date   CREATININE 0.78 05/15/2020   CREATININE 0.92 04/26/2020   CREATININE 0.81 12/20/2019    Lab Results  Component Value Date   WBC 7.0 05/15/2020   HGB 13.7 05/15/2020   HCT 41.4 05/15/2020   PLT 254.0 05/15/2020   GLUCOSE 104 (H) 05/15/2020   CHOL 139 05/15/2020   TRIG 78.0 05/15/2020   HDL 53.80 05/15/2020   LDLCALC 70 05/15/2020   ALT 18 05/15/2020   AST 19 05/15/2020   NA 133 (L) 05/15/2020   K 4.7 05/15/2020   CL 100 05/15/2020   CREATININE 0.78 05/15/2020   BUN 16 05/15/2020   CO2 28 05/15/2020   TSH 0.55 05/15/2020   INR 1.3 02/12/2019   HGBA1C 6.2 05/15/2020   MICROALBUR 8.3 05/25/2017    No results found.  Assessment & Plan:   Problem List Items Addressed This Visit      Unprioritized   Essential hypertension - Primary    Well controlled on current regimen. Renal function stable, no changes today.  Lab Results  Component Value Date   CREATININE 0.78 05/15/2020   Lab Results  Component Value Date   NA 133 (L) 05/15/2020   K 4.7 05/15/2020   CL 100 05/15/2020   CO2 28 05/15/2020         Relevant  Orders   Comp Met (CMET) (Completed)   Hyperlipidemia   Relevant Orders   Lipid Profile (Completed)   RESOLVED: Impaired fasting glucose    a1c has  Been reduce from 6.4 to 6.2      I have  recommended a low glycemic index diet utilizing smaller more frequent meals to increase metabolism.  I have also recommended that patient start exercising with a goal of 30 minutes of aerobic exercise a minimum of 5 days per week.  Lab Results  Component Value Date   HGBA1C 6.2 05/15/2020         Relevant Orders   HgB A1c (Completed)   Other fatigue   Relevant Orders   TSH (Completed)   CBC w/Diff (Completed)   Persistent atrial fibrillation ([place])    Managed with metoprolol 50 mg bid  Advised to increase evening dose to 75 mg.  Continue Eliquis  Prediabetes    Fasting glucose of 112 and a1c has dropped to 6.2 .  Addressed sedentary lifestyle and diet today.  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 Continue 50 mg in the am       Vitamin D deficiency   Relevant Orders   Vitamin D (25 hydroxy) (Completed)      I am having Corryn A. Attwood maintain her acetaminophen, aluminum-magnesium hydroxide, Probiotic Product (PROBIOTIC ADVANCED PO), cholecalciferol, CoQ10, Carboxymethylcellul-Glycerin (LUBRICATING EYE DROPS OP), sodium chloride, apixaban, pravastatin, pantoprazole, metoprolol tartrate, and diltiazem.  No orders of the defined types were placed in this encounter.   Medications Discontinued During This Encounter  Medication Reason  . pantoprazole (PROTONIX) 40 MG tablet Duplicate    Follow-up: No follow-ups on file.   [name], MD

## 2020-05-17 NOTE — Assessment & Plan Note (Signed)
a1c has  Been reduce from 6.4 to 6.2      I have  recommended a low glycemic index diet utilizing smaller more frequent meals to increase metabolism.  I have also recommended that patient start exercising with a goal of 30 minutes of aerobic exercise a minimum of 5 days per week.  Lab Results  Component Value Date   HGBA1C 6.2 05/15/2020

## 2020-05-17 NOTE — Assessment & Plan Note (Signed)
Advised to increase her evening dose of metoprolol to 75 mg for pulse > 100. Continue 50 mg in the am

## 2020-05-17 NOTE — Assessment & Plan Note (Signed)
Well controlled on current regimen. Renal function stable, no changes today.  Lab Results  Component Value Date   CREATININE 0.78 05/15/2020   Lab Results  Component Value Date   NA 133 (L) 05/15/2020   K 4.7 05/15/2020   CL 100 05/15/2020   CO2 28 05/15/2020

## 2020-05-19 DIAGNOSIS — I4819 Other persistent atrial fibrillation: Secondary | ICD-10-CM | POA: Diagnosis not present

## 2020-05-20 ENCOUNTER — Ambulatory Visit: Payer: Medicare Other | Admitting: Cardiovascular Disease

## 2020-05-20 ENCOUNTER — Other Ambulatory Visit: Payer: Self-pay | Admitting: Internal Medicine

## 2020-05-20 NOTE — Telephone Encounter (Signed)
See result note message 

## 2020-05-21 ENCOUNTER — Ambulatory Visit: Payer: Medicare Other | Admitting: Family

## 2020-05-28 ENCOUNTER — Other Ambulatory Visit: Payer: Self-pay | Admitting: Internal Medicine

## 2020-05-28 DIAGNOSIS — Z1231 Encounter for screening mammogram for malignant neoplasm of breast: Secondary | ICD-10-CM

## 2020-05-30 ENCOUNTER — Telehealth: Payer: Self-pay

## 2020-05-30 NOTE — Telephone Encounter (Signed)
Call to patient to review labs.    Pt verbalized understanding and has no further questions at this time.    Advised pt to call for any further questions or concerns.  No further orders.   

## 2020-05-30 NOTE — Telephone Encounter (Signed)
-----   Message from Loel Dubonnet, NP sent at 05/29/2020 11:43 AM EDT ----- Heart monitor looked great! Continue current medications. Monitor showed known atrial fibrillation with good heart rate control.

## 2020-06-10 ENCOUNTER — Ambulatory Visit
Admission: RE | Admit: 2020-06-10 | Discharge: 2020-06-10 | Disposition: A | Discharge status: Home / Self Care | Payer: Medicare Other | Source: Ambulatory Visit | Attending: Internal Medicine | Admitting: Internal Medicine

## 2020-06-10 DIAGNOSIS — Z1231 Encounter for screening mammogram for malignant neoplasm of breast: Secondary | ICD-10-CM

## 2020-07-15 DIAGNOSIS — R0981 Nasal congestion: Secondary | ICD-10-CM | POA: Diagnosis not present

## 2020-07-15 DIAGNOSIS — Z20822 Contact with and (suspected) exposure to covid-19: Secondary | ICD-10-CM | POA: Diagnosis not present

## 2020-07-24 ENCOUNTER — Other Ambulatory Visit: Payer: Self-pay | Admitting: Cardiovascular Disease

## 2020-07-24 NOTE — Telephone Encounter (Signed)
Prescription refill request for Eliquis received.  Last office visit: Ar[place], 05/06/2020 Scr: 05/15/2020,  0.78 Age: 84 y.o. Weight: 73.8 kg   Prescription refill sent.

## 2020-07-24 NOTE — Telephone Encounter (Signed)
Refill request

## 2020-08-26 ENCOUNTER — Other Ambulatory Visit: Payer: Self-pay | Admitting: Cardiovascular Disease

## 2020-08-26 ENCOUNTER — Other Ambulatory Visit: Payer: Self-pay | Admitting: Internal Medicine

## 2020-08-31 ENCOUNTER — Encounter: Payer: Self-pay | Admitting: Emergency Medicine

## 2020-08-31 ENCOUNTER — Ambulatory Visit
Admission: EM | Admit: 2020-08-31 | Discharge: 2020-08-31 | Disposition: A | Discharge status: Home / Self Care | Payer: Medicare Other | Attending: Emergency Medicine | Admitting: Emergency Medicine

## 2020-08-31 ENCOUNTER — Ambulatory Visit (INDEPENDENT_AMBULATORY_CARE_PROVIDER_SITE_OTHER): Payer: Medicare Other

## 2020-08-31 ENCOUNTER — Other Ambulatory Visit: Payer: Self-pay

## 2020-08-31 DIAGNOSIS — S0083XA Contusion of other part of head, initial encounter: Secondary | ICD-10-CM | POA: Diagnosis not present

## 2020-08-31 DIAGNOSIS — R519 Headache, unspecified: Secondary | ICD-10-CM | POA: Diagnosis not present

## 2020-08-31 DIAGNOSIS — S0990XA Unspecified injury of head, initial encounter: Secondary | ICD-10-CM

## 2020-08-31 DIAGNOSIS — S022XXA Fracture of nasal bones, initial encounter for closed fracture: Secondary | ICD-10-CM

## 2020-08-31 NOTE — ED Triage Notes (Signed)
Patient states that she fell on Friday, she tried to catch herself with her hand but hit her face and nose on the rocks in her yard.  Paitnet states that later that afternoon she was pulling on the handles of the hose and it came off and she fell back and hit the back of her head on the gravel.  Patient c/o pain and bruising on her nose, back of her head.  Patient also reports abrasions on her left elbow.  Patient has bruising on her knees and right lower abdomen.

## 2020-08-31 NOTE — Discharge Instructions (Addendum)
Your CT was negative for bleed or bruise in your brain.  You do have a slightly inwardly displaced fracture of the nasal bone, but this should heal on its own without any problem.  You have an abnormality on your CT, radiology thinks that it could be a nerve sheath tumor and have recommended a nonemergent MRI of the face with and without contrast.  However, this can be done on a outpatient basis.

## 2020-08-31 NOTE — ED Provider Notes (Signed)
HPI  SUBJECTIVE:  [name] [name] is a 84 y.o. female who presents with facial and head injury status post slip and fall 3 days ago.  She states that she slid down a ramp, went forward, hit her right face on her glasses.  She reports nasal and infraorbital bruising, swelling.  She reports pain with extraocular movements at first, but this has resolved.  She states that later that day, she pulled backwards on a hose reel and the handle came off.  she fell backwards, hitting her posterior head on some rocks.  She reports a throbbing headache in the area of trauma.  No loss of consciousness amnesia, nausea, vomiting.  No visual changes, arm or leg weakness, facial droop, slurred speech.  No neck or back pain.  No shortness of breath currently.  She denies chest pain, shortness of breath, palpitations, syncope causing her fall.  She has no anterograde or retrograde amnesia.  She has a past medical history of concussion, atrial fibrillation on Eliquis, hypertension.  No history of diabetes, stroke, ICH, subdural hematoma, osteoporosis.  EKC:MKLKJ, Aris Everts, MD   Past Medical History:  Diagnosis Date  . Arthritis   . Atrial flutter ([place]) 02/2011   s/p cardioversion   . Chest pain    a. H/o cardiac cath x 2-> 2012 -->nl cors;  b. 12/2016 MV: EF 61%, small region of mild perfusion defect in the apical anteroseptal region c/w breast attenuation, no ischemia-->Low risk; c. 06/2019 MV: Mod size, mild inflat ischemia, EF 59%. Cor and Ao Ca2+. Inflat defect more pronounced on this study compared to last; c. 07/2019 Cath: LM nl, LAD min irregs, LCX nl, OM1/2/3 nl, RCA min irregs.  . CKD (chronic kidney disease), stage III   . Cystocele   . GERD (gastroesophageal reflux disease)   . Headache(784.0)    chronic  . Hyperlipidemia   . Hypertension   . Knee fracture   . Mobitz type 2 second degree atrioventricular block    a. felt to be 2/2 amiodarone, resolved with decreased amiodarone dose.  Amio since d/c'd.  .  Permanent atrial fibrillation ([place])    a. status post multiple DCCVs; b. 2018 - eval for PVI but opted for rate control.  [name] PONV (postoperative nausea and vomiting)    oxycodone and codiene cause N/V   . Pre-syncope    a. In setting of dehydration and AKI in the past.  . Tachycardia induced cardiomyopathy ([place])    a. Resolved;  b. 08/2017 Echo: EF 50-55%, no rwma, mild MR, mildly to mod dil LA/RA; c. 02/2018 Echo: EF 55-60%, mild MR. Mildly dil LA. Nl RVSP. PASP 26mmHg.  [name] Venous insufficiency   . Vertigo     Past Surgical History:  Procedure Laterality Date  . ABDOMINAL HYSTERECTOMY  1990  . APPENDECTOMY    . AUGMENTATION MAMMAPLASTY Bilateral 1986   implants  . AUGMENTATION MAMMAPLASTY  1990  . AUGMENTATION MAMMAPLASTY  2011  . CARDIAC CATHETERIZATION    . CARDIOVERSION     x 3  . CARDIOVERSION    . CARDIOVERSION N/A 02/07/2017   Procedure: CARDIOVERSION;  Surgeon: Wellington Hampshire, MD;  Location: [facility];  Service: Cardiovascular;  Laterality: N/A;  . CARDIOVERSION N/A 07/22/2017   Procedure: Cardioversion;  Surgeon: Minna Merritts, MD;  Location: [facility];  Service: Cardiovascular;  Laterality: N/A;  . CATARACT EXTRACTION W/PHACO Right 09/21/2016   Procedure: CATARACT EXTRACTION PHACO AND INTRAOCULAR LENS PLACEMENT ([place]);  Surgeon: Birder Robson, MD;  Location: [facility];  Service: Ophthalmology;  Laterality: Right;  [place] 44.1 AP% 16.5 CDE 7.30 Fluid Pack Lot #[id number]  . CATARACT EXTRACTION W/PHACO Left 10/19/2016   Procedure: CATARACT EXTRACTION PHACO AND INTRAOCULAR LENS PLACEMENT (I[facility]);  Surgeon: Birder Robson, MD;  Location: [facility];  Service: Ophthalmology;  Laterality: Left;  [place] 53.7 AP% 19.5 CDE 10.45 Fluid pack lot # [id number]  . CHOLECYSTECTOMY    . COMBINED AUGMENTATION MAMMAPLASTY AND ABDOMINOPLASTY    . JOINT REPLACEMENT Left 06/04/2013   left knee  . KNEE ARTHROSCOPY Right 08/16/2016   Procedure: ARTHROSCOPY KNEE, tear posterior horn medial meniscus, tear  anterior and posterior horns of lateral meniscus, chondromalacia of lateral compartment grade 3 patella and grade 4 medial;  Surgeon: Dereck Leep, MD;  Location: [facility];  Service: Orthopedics;  Laterality: Right;  . LEFT HEART CATH AND CORONARY ANGIOGRAPHY Left 07/27/2019   Procedure: LEFT HEART CATH AND CORONARY ANGIOGRAPHY;  Surgeon: Wellington Hampshire, MD;  Location: [facility];  Service: Cardiovascular;  Laterality: Left;  [name] MASTECTOMY  1986   nipple sparing mastectomy/Bilateral with silicone  breast implants, s/p saline replacements  . Multiple orthopedic procedures    . NOSE SURGERY    . TEE WITHOUT CARDIOVERSION N/A 09/26/2017   Procedure: TRANSESOPHAGEAL ECHOCARDIOGRAM (TEE);  Surgeon: Fay Records, MD;  Location: [facility];  Service: Cardiovascular;  Laterality: N/A;  . TOTAL KNEE ARTHROPLASTY Left     Family History  Problem Relation Age of Onset  . Heart disease Mother   . Cancer Father        stomach  . Heart disease Son        found at autopsy  . Cancer Sister        breast  . Breast cancer Sister   . Cancer Maternal Grandmother        breast  . Breast cancer Maternal Grandmother   . Cancer Sister        breast  . Breast cancer Sister   . Diabetes Brother   . Esophageal cancer Brother   . Ovarian cancer Neg Hx     Social History   Tobacco Use  . Smoking status: Never Smoker  . Smokeless tobacco: Never Used  Vaping Use  . Vaping Use: Never used  Substance Use Topics  . Alcohol use: No  . Drug use: No    No current facility-administered medications for this encounter.  Current Outpatient Medications:  .  acetaminophen (TYLENOL) 500 MG tablet, Take 1,000 mg by mouth every 6 (six) hours as needed (pain). , Disp: , Rfl:  .  aluminum-magnesium hydroxide 200-200 MG/5ML suspension, Take 20 mLs by mouth every 6 (six) hours as needed for indigestion. , Disp: , Rfl:  .  Carboxymethylcellul-Glycerin (LUBRICATING EYE DROPS OP), Place 1 drop into both  eyes daily as needed (dry eyes)., Disp: , Rfl:  .  cholecalciferol (VITAMIN D) 1000 units tablet, Take 1,000 Units by mouth daily., Disp: , Rfl:  .  Coenzyme Q10 (COQ10) 200 MG CAPS, Take 200 mg by mouth daily., Disp: , Rfl:  .  diltiazem (CARDIZEM CD) 180 MG 24 hr capsule, Take 1 capsule (180 mg total) by mouth every evening., Disp: 90 capsule, Rfl: 0 .  ELIQUIS 5 MG TABS tablet, Take 1 tablet (5 mg total) by mouth 2 (two) times daily., Disp: 60 tablet, Rfl: 5 .  metoprolol tartrate (LOPRESSOR) 50 MG tablet, Take 1 tablet (50 mg total) by mouth 2 (two) times daily. (Patient  taking differently: Take 75 mg by mouth 2 (two) times daily. ), Disp: 60 tablet, Rfl: 2 .  pantoprazole (PROTONIX) 40 MG tablet, Take 1 tablet (40 mg total) by mouth daily., Disp: 30 tablet, Rfl: 2 .  pravastatin (PRAVACHOL) 20 MG tablet, TAKE 1 TABLET DAILY, Disp: 90 tablet, Rfl: 0 .  Probiotic Product (PROBIOTIC ADVANCED PO), Take 1 tablet by mouth daily., Disp: , Rfl:  .  sodium chloride (OCEAN) 0.65 % SOLN nasal spray, Place 1 spray into both nostrils daily as needed for congestion., Disp: , Rfl:   Allergies  Allergen Reactions  . Iodine Anaphylaxis    NO PROBLEMS WITH BETADINE  . Amiodarone     Cannot remember   . Biaxin [Clarithromycin] Nausea And Vomiting  . Codeine Nausea And Vomiting  . Digoxin And Related Other (See Comments)    Fatigue, eye puffiness, hoarsness  . Enalapril Other (See Comments)    unknown  . Fluocinonide Other (See Comments)    Tingling sensation in head and redness to scalp.  . Iodinated Diagnostic Agents Other (See Comments)    Tachycardia  . Oxycodone Nausea And Vomiting  . Promethazine Other (See Comments)    Unknown   . Tizanidine Hcl Other (See Comments)    hypotension   . Warfarin And Related     Bleeding   . Xarelto [Rivaroxaban] Other (See Comments)    bleeding     ROS  As noted in HPI.   Physical Exam  BP (!) 141/89 (BP Location: Left Arm)   Pulse 87   Temp  98.2 F (36.8 C) (Oral)   Resp 14   Ht 5\' 3"  (1.6 m)   Wt 73 kg   SpO2 98%   BMI 28.52 kg/m   Constitutional: Well developed, well nourished, no acute distress Eyes:  EOMI, conjunctiva normal bilaterally PERRLA.  No direct or consensual photophobia.  No evidence of entrapment.  Positive infraorbital tenderness, ecchymosis right side.  Positive right nasal bridge tenderness.       HENT: Normocephalic, no other facial tenderness other than the right nasal bridge.  No hemotympanum, septal hematoma.   Respiratory: Normal inspiratory effort, lungs clear bilaterally Cardiovascular: Irregularly irregular no murmurs rubs or gallops GI: nondistended skin: No rash, skin intact Musculoskeletal: no deformities.  No C, T, L-spine tenderness. Neurologic: Alert & oriented x 3, no focal neuro deficits.  Finger-nose, heel shin within normal limits.  Tandem gait steady. Psychiatric: Speech and behavior appropriate   ED Course   Medications - No data to display  Orders Placed This Encounter  Procedures  . CT Maxillofacial Wo Contrast    Standing Status:   Standing    Number of Occurrences:   1  . CT Head Wo Contrast    Standing Status:   Standing    Number of Occurrences:   1    No results found for this or any previous visit (from the past 24 hour(s)). CT Head Wo Contrast  Result Date: 08/31/2020 CLINICAL DATA:  84 year old who is currently anticoagulated on Eliquis who has fallen multiple times in the past 3 days, striking her head at least once. Bruising to the nose. Occipital headache. Initial encounter. EXAM: CT HEAD WITHOUT CONTRAST CT MAXILLOFACIAL WITHOUT CONTRAST TECHNIQUE: Multidetector CT imaging of the head and maxillofacial structures were performed using the standard protocol without intravenous contrast. Multiplanar CT image reconstructions of the maxillofacial structures were also generated. A metallic BB was placed on the right temple in order to reliably differentiate  right from left. COMPARISON:  CT head 03/24/2020 and earlier. No prior maxillofacial CT. FINDINGS: CT HEAD FINDINGS Brain: Ventricular system normal in size and appearance for age. Mild age related cortical atrophy, unchanged dating back to at least 2015. No mass lesion. No midline shift. No acute hemorrhage or hematoma. No extra-axial fluid collections. No evidence of acute infarction. Vascular: No hyperdense vessel.  No visible atherosclerosis. Skull: No skull fracture or other focal osseous abnormality involving the skull. Other: None. CT MAXILLOFACIAL FINDINGS Osseous: Slight inwardly displaced fracture of the RIGHT nasal bone (3/33). No other facial bone fractures. Temporomandibular joints anatomically aligned without significant degenerative changes. Orbits: No fractures. 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 Normal appearing LEFT orbit. Sinuses: Minimal to mild mucosal thickening involving the RIGHT maxillary sinus. Remaining paranasal sinuses well aerated. BILATERAL mastoid air cells and BILATERAL middle ear cavities well-aerated. Soft tissues: No evidence of hematoma involving the facial soft tissues. IMPRESSION: 1. No acute intracranial abnormality. 2. Slight inwardly displaced fracture of the RIGHT nasal bone. No other facial bone fractures identified. 3. 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 Non-emergent MRI of the face without and with contrast is recommended in further evaluation. 4. Minimal to mild chronic RIGHT maxillary sinus disease. Electronically Signed   By: Evangeline Dakin M.D.   On: 08/31/2020 13:44   CT Maxillofacial Wo Contrast  Result Date: 08/31/2020 CLINICAL DATA:  84 year old who is currently anticoagulated on Eliquis who has fallen multiple  times in the past 3 days, striking her head at least once. Bruising to the nose. Occipital headache. Initial encounter. EXAM: CT HEAD WITHOUT CONTRAST CT MAXILLOFACIAL WITHOUT CONTRAST TECHNIQUE: Multidetector CT imaging of the head and maxillofacial structures were performed using the standard protocol without intravenous contrast. Multiplanar CT image reconstructions of the maxillofacial structures were also generated. A metallic BB was placed on the right temple in order to reliably differentiate right from left. COMPARISON:  CT head 03/24/2020 and earlier. No prior maxillofacial CT. FINDINGS: CT HEAD FINDINGS Brain: Ventricular system normal in size and appearance for age. Mild age related cortical atrophy, unchanged dating back to at least 2015. No mass lesion. No midline shift. No acute hemorrhage or hematoma. No extra-axial fluid collections. No evidence of acute infarction. Vascular: No hyperdense vessel.  No visible atherosclerosis. Skull: No skull fracture or other focal osseous abnormality involving the skull. Other: None. CT MAXILLOFACIAL FINDINGS Osseous: Slight inwardly displaced fracture of the RIGHT nasal bone (3/33). No other facial bone fractures. Temporomandibular joints anatomically aligned without significant degenerative changes. Orbits: No fractures. 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 Normal appearing LEFT orbit. Sinuses: Minimal to mild mucosal thickening involving the RIGHT maxillary sinus. Remaining paranasal sinuses well aerated. BILATERAL mastoid air cells and BILATERAL middle ear cavities well-aerated. Soft tissues: No evidence of hematoma involving the facial soft tissues. IMPRESSION: 1. No acute intracranial abnormality. 2. Slight inwardly displaced fracture of the RIGHT nasal bone. No other facial bone fractures identified. 3. 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 Non-emergent MRI of the face without  and with contrast is recommended in further evaluation. 4. Minimal to mild chronic RIGHT maxillary sinus disease. Electronically Signed   By: Evangeline Dakin M.D.   On: 08/31/2020 13:44    ED Clinical Impression  1. Minor head injury, initial encounter   2. Closed fracture of nasal bone, initial encounter   3. Contusion of face, initial encounter      ED Assessment/Plan  Patient with minor head trauma on Eliquis, will CT head.  She is completely neurologically intact.  She also has orbital and nasal bridge tenderness.  Will get a maxillofacial bones to rule out orbital fracture.  No evidence of globe injury  Radiology independently reviewed.  No subarachnoid hemorrhage or subdural hematoma.  Positive slight inwardly displaced fracture right nasal bone.  No other facial bone fractures.   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  Nonemergent MRI of the face with and without contrast is recommended.  See radiology report for details.  Patient with minor head trauma, possibly concussion.  She has no evidence of intracranial or skull injury.  She has a slightly inwardly displaced nasal bone fracture, but this should heal on its own.  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  This can be done with her primary care physician.  Home with Tylenol, ice.  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 Discussed sn/sx that should prompt return to the ED. patient agrees with plan.   No orders of the defined types were placed in this encounter.   *This clinic note  was created using Dragon dictation software. Therefore, there may be occasional mistakes despite careful proofreading.   ?    Melynda Ripple, MD 09/01/20 [phone]

## 2020-09-04 ENCOUNTER — Other Ambulatory Visit: Payer: Self-pay

## 2020-09-04 ENCOUNTER — Encounter: Payer: Self-pay | Admitting: Cardiovascular Disease

## 2020-09-04 ENCOUNTER — Other Ambulatory Visit: Payer: Self-pay | Admitting: Cardiovascular Disease

## 2020-09-04 ENCOUNTER — Ambulatory Visit: Payer: Medicare Other | Admitting: Cardiovascular Disease

## 2020-09-04 VITALS — BP 144/82 | HR 89 | Ht 63.0 in | Wt 160.0 lb

## 2020-09-04 DIAGNOSIS — I4819 Other persistent atrial fibrillation: Secondary | ICD-10-CM | POA: Diagnosis not present

## 2020-09-04 DIAGNOSIS — I10 Essential (primary) hypertension: Secondary | ICD-10-CM

## 2020-09-04 DIAGNOSIS — E7800 Pure hypercholesterolemia, unspecified: Secondary | ICD-10-CM | POA: Diagnosis not present

## 2020-09-04 MED ORDER — METOPROLOL TARTRATE 50 MG PO TABS: 50.0000 mg | ORAL_TABLET | Freq: Three times a day (TID) | ORAL | 1 refills | Status: DC

## 2020-09-04 NOTE — Progress Notes (Signed)
Cardiology Office Note   Date:  09/04/2020   ID:  [name] [name], DOB 1936-10-28, MRN [id number]  PCP:  [name], MD  Cardiologist:   [name], MD   Chief Complaint  Patient presents with  . office visit    4 month F/U; Meds verbally reviewed with patient.      History of Present Illness: [name] [name] is a 84 y.o. female who presents for a followup visit regarding permanent atrial fibrillation.   She has known history of  atrial fibrillation/flutter status post multiple cardioversions in the past (total of 7). 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  Most recent cardioversion was in Feb of 2018. Echocardiogram in March, 2019 showed an EF of 55 to 60% with mildly dilated left atrium and minimal pulmonary hypertension. She is being treated with rate control at the present time.   Most recent ischemic cardiac evaluation was in July 2020 which included a Lexiscan Myoview which showed possible inferolateral ischemia with normal ejection fraction.  Cardiac catheterization was followed in August which showed minimal irregularities with no evidence of obstructive coronary artery disease, normal ejection fraction mildly elevated left ventricular end-diastolic pressure.  She has been doing reasonably well with no recent chest pain, worsening dyspnea or palpitations.  She takes diltiazem extended release 180 mg once daily and metoprolol 50 mg twice daily and occasionally she takes a third dose if needed for blood pressure and heart rate. She fell recently while she was doing some work in the [place].  She did not have syncope.  She had some facial contusion and went to the emergency room.  CT scan of the head showed no evidence of bleed but she was found to have a nondisplaced fracture of the nose.  Past Medical History:  Diagnosis Date  . Arthritis   . Atrial flutter ([place]) 02/2011   s/p cardioversion   . Chest pain    a. H/o cardiac cath x  2-> 2012 -->nl cors;  b. 12/2016 MV: EF 61%, small region of mild perfusion defect in the apical anteroseptal region c/w breast attenuation, no ischemia-->Low risk; c. 06/2019 MV: Mod size, mild inflat ischemia, EF 59%. Cor and Ao Ca2+. Inflat defect more pronounced on this study compared to last; c. 07/2019 Cath: LM nl, LAD min irregs, LCX nl, OM1/2/3 nl, RCA min irregs.  . CKD (chronic kidney disease), stage III   . Cystocele   . GERD (gastroesophageal reflux disease)   . Headache(784.0)    chronic  . Hyperlipidemia   . Hypertension   . Knee fracture   . Mobitz type 2 second degree atrioventricular block    a. felt to be 2/2 amiodarone, resolved with decreased amiodarone dose.  Amio since d/c'd.  . Permanent atrial fibrillation ([place])    a. status post multiple DCCVs; b. 2018 - eval for PVI but opted for rate control.  [name] PONV (postoperative nausea and vomiting)    oxycodone and codiene cause N/V   . Pre-syncope    a. In setting of dehydration and AKI in the past.  . 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 Resolved;  b. 08/2017 Echo: EF 50-55%, no rwma, mild MR, mildly to mod dil LA/RA; c. 02/2018 Echo: EF 55-60%, mild MR. Mildly dil LA. Nl RVSP. PASP 39mmHg.  [name] Venous insufficiency   . Vertigo     Past Surgical History:  Procedure Laterality Date  . ABDOMINAL HYSTERECTOMY  1990  .  APPENDECTOMY    . AUGMENTATION MAMMAPLASTY Bilateral 1986   implants  . AUGMENTATION MAMMAPLASTY  1990  . AUGMENTATION MAMMAPLASTY  2011  . CARDIAC CATHETERIZATION    . CARDIOVERSION     x 3  . CARDIOVERSION    . CARDIOVERSION N/A 02/07/2017   Procedure: CARDIOVERSION;  Surgeon: Wellington Hampshire, MD;  Location: [facility];  Service: Cardiovascular;  Laterality: N/A;  . CARDIOVERSION N/A 07/22/2017   Procedure: Cardioversion;  Surgeon: Minna Merritts, MD;  Location: [facility];  Service: Cardiovascular;  Laterality: N/A;  . CATARACT EXTRACTION W/PHACO Right 09/21/2016   Procedure: CATARACT EXTRACTION  PHACO AND INTRAOCULAR LENS PLACEMENT ([place]);  Surgeon: Birder Robson, MD;  Location: [facility];  Service: Ophthalmology;  Laterality: Right;  [place] 44.1 AP% 16.5 CDE 7.30 Fluid Pack Lot #[id number]  . CATARACT EXTRACTION W/PHACO Left 10/19/2016   Procedure: CATARACT EXTRACTION PHACO AND INTRAOCULAR LENS PLACEMENT (IOC);  Surgeon: Birder Robson, MD;  Location: [facility];  Service: Ophthalmology;  Laterality: Left;  [place] 53.7 AP% 19.5 CDE 10.45 Fluid pack lot # [id number]  . CHOLECYSTECTOMY    . COMBINED AUGMENTATION MAMMAPLASTY AND ABDOMINOPLASTY    . JOINT REPLACEMENT Left 06/04/2013   left knee  . KNEE ARTHROSCOPY Right 08/16/2016   Procedure: ARTHROSCOPY KNEE, tear posterior horn medial meniscus, tear anterior and posterior horns of lateral meniscus, chondromalacia of lateral compartment grade 3 patella and grade 4 medial;  Surgeon: Dereck Leep, MD;  Location: [facility];  Service: Orthopedics;  Laterality: Right;  . LEFT HEART CATH AND CORONARY ANGIOGRAPHY Left 07/27/2019   Procedure: LEFT HEART CATH AND CORONARY ANGIOGRAPHY;  Surgeon: Wellington Hampshire, MD;  Location: [facility];  Service: Cardiovascular;  Laterality: Left;  [name] MASTECTOMY  1986   nipple sparing mastectomy/Bilateral with silicone  breast implants, s/p saline replacements  . Multiple orthopedic procedures    . NOSE SURGERY    . TEE WITHOUT CARDIOVERSION N/A 09/26/2017   Procedure: TRANSESOPHAGEAL ECHOCARDIOGRAM (TEE);  Surgeon: Fay Records, MD;  Location: [facility];  Service: Cardiovascular;  Laterality: N/A;  . TOTAL KNEE ARTHROPLASTY Left      Current Outpatient Medications  Medication Sig Dispense Refill  . acetaminophen (TYLENOL) 500 MG tablet Take 1,000 mg by mouth every 6 (six) hours as needed (pain).     [name] aluminum-magnesium hydroxide 200-200 MG/5ML suspension Take 20 mLs by mouth every 6 (six) hours as needed for indigestion.     . Carboxymethylcellul-Glycerin (LUBRICATING EYE DROPS OP) Place 1 drop into  both eyes daily as needed (dry eyes).    . cholecalciferol (VITAMIN D) 1000 units tablet Take 1,000 Units by mouth daily.    . Coenzyme Q10 (COQ10) 200 MG CAPS Take 200 mg by mouth daily.    [name] diltiazem (CARDIZEM CD) 180 MG 24 hr capsule Take 1 capsule (180 mg total) by mouth every evening. 90 capsule 0  . ELIQUIS 5 MG TABS tablet Take 1 tablet (5 mg total) by mouth 2 (two) times daily. 60 tablet 5  . METOPROLOL TARTRATE PO Take by mouth. Take 1 tablet (50mg ) 2-3 times daily as directed    . pantoprazole (PROTONIX) 40 MG tablet Take 1 tablet (40 mg total) by mouth daily. 30 tablet 2  . pravastatin (PRAVACHOL) 20 MG tablet TAKE 1 TABLET DAILY 90 tablet 0  . Probiotic Product (PROBIOTIC ADVANCED PO) Take 1 tablet by mouth daily.    . sodium chloride (OCEAN) 0.65 % SOLN nasal spray Place 1 spray into both  nostrils daily as needed for congestion.    . metoprolol tartrate (LOPRESSOR) 50 MG tablet Take 1 tablet (50 mg total) by mouth 2 (two) times daily. 270 tablet 0   No current facility-administered medications for this visit.    Allergies:   Iodine, Amiodarone, Biaxin [clarithromycin], Codeine, Digoxin and related, Enalapril, Fluocinonide, Iodinated diagnostic agents, Oxycodone, Promethazine, Tizanidine hcl, Warfarin and related, and Xarelto [rivaroxaban]    Social History:  The patient  reports that she has never smoked. She has never used smokeless tobacco. She reports that she does not drink alcohol and does not use drugs.   Family History:  The patient's family history includes Breast cancer in her maternal grandmother, sister, and sister; Cancer in her father, maternal grandmother, sister, and sister; Diabetes in her brother; Esophageal cancer in her brother; Heart disease in her mother and son.    ROS:  Please see the history of present illness.   Otherwise, review of systems are positive for none.   All other systems are reviewed and negative.    PHYSICAL EXAM: VS:  BP (!) 144/82 (BP  Location: Left Arm, Patient Position: Sitting, Cuff Size: Normal)   Pulse 89   Ht 5\' 3"  (1.6 m)   Wt 160 lb (72.6 kg)   SpO2 98%   BMI 28.34 kg/m  , BMI Body mass index is 28.34 kg/m. GEN: Well nourished, well developed, in no acute distress  HEENT: Left temporal lobe tenderness Neck: no JVD, carotid bruits, or masses Cardiac: Irregularly irregular; no murmurs, rubs, or gallops,no edema  Respiratory:  clear to auscultation bilaterally, normal work of breathing GI: soft, nontender, nondistended, + BS MS: no deformity or atrophy  Skin: warm and dry, no rash Neuro:  Strength and sensation are intact Psych: euthymic mood, full affect Right radial pulses normal with no hematoma  EKG:  EKG is ordered today. The ekg ordered today demonstrates atrial fibrillation with right bundle branch block.  Ventricular rate is 89 bpm  Recent Labs: 05/15/2020: ALT 18; BUN 16; Creatinine, Ser 0.78; Hemoglobin 13.7; Platelets 254.0; Potassium 4.7; Sodium 133; TSH 0.55    Lipid Panel    Component Value Date/Time   CHOL 139 05/15/2020 1441   TRIG 78.0 05/15/2020 1441   HDL 53.80 05/15/2020 1441   CHOLHDL 3 05/15/2020 1441   VLDL 15.6 05/15/2020 1441   LDLCALC 70 05/15/2020 1441      Wt Readings from Last 3 Encounters:  09/04/20 160 lb (72.6 kg)  08/31/20 161 lb (73 kg)  05/17/20 162 lb 12.8 oz (73.8 kg)       ASSESSMENT AND PLAN:  1.  Permanent atrial fibrillation: Ventricular rate is reasonably controlled on diltiazem and metoprolol.  She takes metoprolol 50 mg twice daily but occasionally she takes a third dose as needed.  She is tolerating anticoagulation with no side effects.  I discussed with her the importance of being careful about avoiding injuries and falls.    2. Essential hypertension: Blood pressure is reasonably controlled.  3. History of tachycardia-induced cardiomyopathy. Most recent ejection fraction was 55%.  4. Left carotid calcifications: Carotid Doppler in August of  2018 showed mild nonobstructive disease.   Disposition:   FU with me in 6 months    Signed, [name], MD 09/04/20 [place], [place]

## 2020-09-04 NOTE — Patient Instructions (Signed)
Medication Instructions:  Your physician recommends that you continue on your current medications as directed. Please refer to the Current Medication list given to you today.   Metoprolol 50 mg three times daily has been refilled.  *If you need a refill on your cardiac medications before your next appointment, please call your pharmacy*   Lab Work: None ordered If you have labs (blood work) drawn today and your tests are completely normal, you will receive your results only by: [name] MyChart Message (if you have MyChart) OR . A paper copy in the mail If you have any lab test that is abnormal or we need to change your treatment, we will call you to review the results.   Testing/Procedures: None ordered   Follow-Up: At [facility], you and your health needs are our priority.  As part of our continuing mission to provide you with exceptional heart care, we have created designated Provider Care Teams.  These Care Teams include your primary Cardiologist (physician) and Advanced Practice Providers (APPs -  Physician Assistants and Nurse Practitioners) who all work together to provide you with the care you need, when you need it.  We recommend signing up for the patient portal called "MyChart".  Sign up information is provided on this After Visit Summary.  MyChart is used to connect with patients for Virtual Visits (Telemedicine).  Patients are able to view lab/test results, encounter notes, upcoming appointments, etc.  Non-urgent messages can be sent to your provider as well.   To learn more about what you can do with MyChart, go to [email or web address].    Your next appointment:   6 month(s)  The format for your next appointment:   In Person  Provider:    You may see [name], MD or one of the following Advanced Practice Providers on your designated Care Team:    Murray Hodgkins, NP  Christell Faith, PA-C  Marrianne Mood, PA-C    Other Instructions N/A

## 2020-10-03 DIAGNOSIS — H26493 Other secondary cataract, bilateral: Secondary | ICD-10-CM | POA: Diagnosis not present

## 2020-10-09 DIAGNOSIS — H26491 Other secondary cataract, right eye: Secondary | ICD-10-CM | POA: Diagnosis not present

## 2020-10-24 ENCOUNTER — Ambulatory Visit: Payer: Medicare Other | Admitting: Cardiovascular Disease

## 2020-11-10 ENCOUNTER — Other Ambulatory Visit: Payer: Self-pay

## 2020-11-10 ENCOUNTER — Ambulatory Visit (INDEPENDENT_AMBULATORY_CARE_PROVIDER_SITE_OTHER): Payer: Medicare Other | Admitting: Dermatology

## 2020-11-10 DIAGNOSIS — D0462 Carcinoma in situ of skin of left upper limb, including shoulder: Secondary | ICD-10-CM

## 2020-11-10 DIAGNOSIS — L820 Inflamed seborrheic keratosis: Secondary | ICD-10-CM

## 2020-11-10 DIAGNOSIS — L578 Other skin changes due to chronic exposure to nonionizing radiation: Secondary | ICD-10-CM | POA: Diagnosis not present

## 2020-11-10 DIAGNOSIS — D485 Neoplasm of uncertain behavior of skin: Secondary | ICD-10-CM

## 2020-11-10 DIAGNOSIS — C4492 Squamous cell carcinoma of skin, unspecified: Secondary | ICD-10-CM

## 2020-11-10 DIAGNOSIS — L821 Other seborrheic keratosis: Secondary | ICD-10-CM | POA: Diagnosis not present

## 2020-11-10 HISTORY — DX: Squamous cell carcinoma of skin, unspecified: C44.92

## 2020-11-10 NOTE — Progress Notes (Signed)
   Follow-Up Visit   Subjective  [name] [name] is a 84 y.o. female who presents for the following: Skin Problem (Patient has a spot at scalp that feels sharp and has been there for a few months. She also has a spot that has been on her left arm for a few months that itches and sometimes burns. ).  The following portions of the chart were reviewed this encounter and updated as appropriate:  Tobacco  Allergies  Meds  Problems  Med Hx  Surg Hx  Fam Hx     Review of Systems:  No other skin or systemic complaints except as noted in HPI or Assessment and Plan.  Objective  Well appearing patient in no apparent distress; mood and affect are within normal limits.  A focused examination was performed including scalp, arms. Relevant physical exam findings are noted in the Assessment and Plan.  Objective  Left distal posterior deltoid: 1.2cm pink patch  Objective  Scalp: Erythematous keratotic or waxy stuck-on papule or plaque.    Assessment & Plan  Neoplasm of uncertain behavior of skin Left distal posterior deltoid  Skin / nail biopsy Type of biopsy: tangential   Informed consent: discussed and consent obtained   Timeout: patient name, date of birth, surgical site, and procedure verified   Procedure prep:  Patient was prepped and draped in usual sterile fashion Prep type:  Isopropyl alcohol Anesthesia: the lesion was anesthetized in a standard fashion   Anesthetic:  1% lidocaine w/ epinephrine 1-100,000 buffered w/ 8.4% NaHCO3 Instrument used: flexible razor blade   Hemostasis achieved with: pressure, aluminum chloride and electrodesiccation   Outcome: patient tolerated procedure well   Post-procedure details: sterile dressing applied and wound care instructions given   Dressing type: petrolatum and bandage    Specimen 1 - Surgical pathology Differential Diagnosis: r/o BCC Check Margins: No 1.2cm pink patch  Inflamed seborrheic keratosis Scalp  Destruction of lesion  - Scalp Complexity: simple   Destruction method: cryotherapy   Informed consent: discussed and consent obtained   Timeout:  patient name, date of birth, surgical site, and procedure verified Lesion destroyed using liquid nitrogen: Yes   Region frozen until ice ball extended beyond lesion: Yes   Outcome: patient tolerated procedure well with no complications   Post-procedure details: wound care instructions given    Actinic Damage - chronic, secondary to cumulative UV radiation exposure/sun exposure over time - diffuse scaly erythematous macules with underlying dyspigmentation - Recommend daily broad spectrum sunscreen SPF 30+ to sun-exposed areas, reapply every 2 hours as needed.  - Call for new or changing lesions.  Seborrheic Keratoses - Stuck-on, waxy, tan-brown papules and plaques  - Discussed benign etiology and prognosis. - Observe - Call for any changes  Return in about 2 months (around 01/10/2021).  Graciella Belton, RMA, am acting as scribe for Sarina Ser, MD . Documentation: I have reviewed the above documentation for accuracy and completeness, and I agree with the above.  Sarina Ser, MD

## 2020-11-10 NOTE — Patient Instructions (Addendum)
Wound Care Instructions  1. Cleanse wound gently with soap and water once a day then pat dry with clean gauze. Apply a thing coat of Petrolatum (petroleum jelly, "Vaseline") over the wound (unless you have an allergy to this). We recommend that you use a new, sterile tube of Vaseline. Do not pick or remove scabs. Do not remove the yellow or white "healing tissue" from the base of the wound.  2. Cover the wound with fresh, clean, nonstick gauze and secure with paper tape. You may use Band-Aids in place of gauze and tape if the would is small enough, but would recommend trimming much of the tape off as there is often too much. Sometimes Band-Aids can irritate the skin.  3. You should call the office for your biopsy report after 1 week if you have not already been contacted.  4. If you experience any problems, such as abnormal amounts of bleeding, swelling, significant bruising, significant pain, or evidence of infection, please call the office immediately.  5. FOR ADULT SURGERY PATIENTS: If you need something for pain relief you may take 1 extra strength Tylenol (acetaminophen) AND 2 Ibuprofen (200mg  each) together every 4 hours as needed for pain. (do not take these if you are allergic to them or if you have a reason you should not take them.) Typically, you may only need pain medication for 1 to 3 days.    Cryotherapy Aftercare  . Wash gently with soap and water everyday.   [name] Apply Vaseline and Band-Aid daily until healed.  Seborrheic Keratosis  What causes seborrheic keratoses? Seborrheic keratoses are harmless, common skin growths that first appear during adult life.  As time goes by, more growths appear.  Some people may develop a large number of them.  Seborrheic keratoses appear on both covered and uncovered body parts.  They are not caused by sunlight.  The tendency to develop seborrheic keratoses can be inherited.  They vary in color from skin-colored to gray, brown, or even black.  They  can be either smooth or have a rough, warty surface.   Seborrheic keratoses are superficial and look as if they were stuck on the skin.  Under the microscope this type of keratosis looks like layers upon layers of skin.  That is why at times the top layer may seem to fall off, but the rest of the growth remains and re-grows.    Treatment Seborrheic keratoses do not need to be treated, but can easily be removed in the office.  Seborrheic keratoses often cause symptoms when they rub on clothing or jewelry.  Lesions can be in the way of shaving.  If they become inflamed, they can cause itching, soreness, or burning.  Removal of a seborrheic keratosis can be accomplished by freezing, burning, or surgery. If any spot bleeds, scabs, or grows rapidly, please return to have it checked, as these can be an indication of a skin cancer.

## 2020-11-12 ENCOUNTER — Telehealth: Payer: Self-pay

## 2020-11-12 NOTE — Telephone Encounter (Signed)
-----   Message from Ralene Bathe, MD sent at 11/11/2020  6:39 PM EST ----- Diagnosis Skin , left distal posterior deltoid SQUAMOUS CELL CARCINOMA IN SITU  Cancer - SCC in situ Superficial Schedule for treatment (EDC)

## 2020-11-12 NOTE — Telephone Encounter (Signed)
Unable to leave a message, because the patient's phone hung up.

## 2020-11-18 ENCOUNTER — Encounter: Payer: Self-pay | Admitting: Dermatology

## 2020-11-24 ENCOUNTER — Telehealth: Payer: Self-pay | Admitting: Internal Medicine

## 2020-11-24 ENCOUNTER — Other Ambulatory Visit: Payer: Self-pay | Admitting: Internal Medicine

## 2020-11-24 NOTE — Telephone Encounter (Signed)
Pt called to give her Flu and Covid vaccine info  Flu-10/15/20 [employer] drug  Covid 1st 01/14/20 2nd 02/04/20 Booster 11/18/20- [address]

## 2020-11-24 NOTE — Telephone Encounter (Signed)
Called pt to see what covid vaccine she received. Pt stated she got pfizer. Chart has been updated.

## 2020-11-25 ENCOUNTER — Other Ambulatory Visit: Payer: Self-pay | Admitting: Cardiovascular Disease

## 2020-12-02 ENCOUNTER — Telehealth: Payer: Self-pay

## 2020-12-02 NOTE — Telephone Encounter (Signed)
-----   Message from Ralene Bathe, MD sent at 11/11/2020  6:39 PM EST ----- Diagnosis Skin , left distal posterior deltoid SQUAMOUS CELL CARCINOMA IN SITU  Cancer - SCC in situ Superficial Schedule for treatment (EDC)

## 2020-12-02 NOTE — Telephone Encounter (Signed)
Patient informed of pathology results. Plan to treat at follow up appointment.

## 2020-12-17 ENCOUNTER — Ambulatory Visit: Payer: Medicare Other | Admitting: Cardiovascular Disease

## 2020-12-17 ENCOUNTER — Telehealth: Payer: Self-pay | Admitting: Cardiovascular Disease

## 2020-12-17 ENCOUNTER — Other Ambulatory Visit: Payer: Self-pay

## 2020-12-17 ENCOUNTER — Encounter: Payer: Self-pay | Admitting: Cardiovascular Disease

## 2020-12-17 VITALS — BP 120/72 | HR 81 | Ht 64.0 in | Wt 162.2 lb

## 2020-12-17 DIAGNOSIS — I272 Pulmonary hypertension, unspecified: Secondary | ICD-10-CM

## 2020-12-17 DIAGNOSIS — I43 Cardiomyopathy in diseases classified elsewhere: Secondary | ICD-10-CM

## 2020-12-17 DIAGNOSIS — I4819 Other persistent atrial fibrillation: Secondary | ICD-10-CM | POA: Diagnosis not present

## 2020-12-17 DIAGNOSIS — R000 Tachycardia, unspecified: Secondary | ICD-10-CM | POA: Diagnosis not present

## 2020-12-17 DIAGNOSIS — I10 Essential (primary) hypertension: Secondary | ICD-10-CM

## 2020-12-17 DIAGNOSIS — R079 Chest pain, unspecified: Secondary | ICD-10-CM

## 2020-12-17 NOTE — Telephone Encounter (Signed)
Pt c/o of Chest Pain: STAT if CP now or developed within 24 hours  1. Are you having CP right now? yes  2. Are you experiencing any other symptoms (ex. SOB, nausea, vomiting, sweating)? Indigestion, high BP (144/76), pain on left side  3. How long have you been experiencing CP? Since she woke up  4. Is your CP continuous or coming and going? States it is worse at some times  5. Have you taken Nitroglycerin? no ?  Patient wants to come in and get an EKG this morning and will refuse the emergency room

## 2020-12-17 NOTE — Telephone Encounter (Signed)
Spoke with patient and she reports indigestion and had some chest pressure and in her arm. She has not felt well and states she has a history of afib but when she woke up she had chest pain and pain up into her arm. She has been burping and just concerned and adamently  refuses going to the ED. She states it is not severe at this time and wants to come in for appointment. Blood pressures go up and down and wants to come in for assessment and EKG. Scheduled her to come in and see Dr. Mariah Milling but that we highly recommend going to ED for assessment. She again refused but let her know that she needs to please go to ED if chest pain persists or gets worse. She verbalized understanding of our conversation, agreement with plan, and had no further questions at this time.

## 2020-12-17 NOTE — Patient Instructions (Signed)
Medication Instructions:  No changes  If you need a refill on your cardiac medications before your next appointment, please call your pharmacy.    Lab work: No new labs needed   If you have labs (blood work) drawn today and your tests are completely normal, you will receive your results only by: . MyChart Message (if you have MyChart) OR . A paper copy in the mail If you have any lab test that is abnormal or we need to change your treatment, we will call you to review the results.   Testing/Procedures: No new testing needed   Follow-Up: At [facility], you and your health needs are our priority.  As part of our continuing mission to provide you with exceptional heart care, we have created designated Provider Care Teams.  These Care Teams include your primary Cardiologist (physician) and Advanced Practice Providers (APPs -  Physician Assistants and Nurse Practitioners) who all work together to provide you with the care you need, when you need it.  . You will need a follow up appointment in 6 months with Dr. Arida  . Providers on your designated Care Team:   . Christopher Berge, NP . Ryan Dunn, PA-C . Jacquelyn Visser, PA-C  Any Other Special Instructions Will Be Listed Below (If Applicable).  COVID-19 Vaccine Information can be found at: [email or web address] For questions related to vaccine distribution or appointments, please email [email or web address] or call [phone].     

## 2020-12-17 NOTE — Progress Notes (Signed)
Cardiology Office Note  Date:  12/17/2020   ID:  [name] [name], DOB [birth year], MRN [id number]  PCP:  [name], MD   Chief Complaint  Patient presents with  . Chest Pain    c/o waking up this am with chest pain/indigestion.     HPI:  [name] [name] is a 84 y.o. female with past medical history of atrial fibrillation/flutter status post multiple cardioversions in the past (total of 7).  tachycardia-induced cardiomyopathy, resolved cardioversion in Feb of 2018.  EF of 55 to 60%   She is being treated with rate control at the present time.  Who presents for follow-up of her atrial fibrillation, chest pain symptoms  Patient of Dr. Fletcher Anon, added on the schedule today for new symptoms of chest pain This Am woke up,  Left pectoral area with pain radiating up into her left shoulder Also reports having some indigestion, some burping Call primary care, did not get to talk to anybody Called our office, declined to go to the emergency room Reports that her symptoms resolved without intervention  Denies having any pain on exertion Active at baseline She does report lifting some heavy bags of salt for her water softener, taking them into the basement one or 2 days ago  Cardiac catheterization performed last year reviewed with her, no significant coronary disease  EKG personally reviewed by myself on todays visit Atrial fibrillation rate 81 bpm right bundle branch block   PMH:   has a past medical history of Arthritis, Atrial flutter ([place]) (02/2011), Chest pain, CKD (chronic kidney disease), stage III ([place]), Cystocele, GERD (gastroesophageal reflux disease), Headache(784.0), Hyperlipidemia, Hypertension, Knee fracture, Mobitz type 2 second degree atrioventricular block, Permanent atrial fibrillation ([place]), PONV (postoperative nausea and vomiting), Pre-syncope, Squamous cell carcinoma of skin (11/10/2020), Tachycardia induced cardiomyopathy ([place]), Venous insufficiency, and  Vertigo.  PSH:    Past Surgical History:  Procedure Laterality Date  . ABDOMINAL HYSTERECTOMY  1990  . APPENDECTOMY    . AUGMENTATION MAMMAPLASTY Bilateral 1986   implants  . AUGMENTATION MAMMAPLASTY  1990  . AUGMENTATION MAMMAPLASTY  2011  . CARDIAC CATHETERIZATION    . CARDIOVERSION     x 3  . CARDIOVERSION    . CARDIOVERSION N/A 02/07/2017   Procedure: CARDIOVERSION;  Surgeon: Wellington Hampshire, MD;  Location: [facility] ORS;  Service: Cardiovascular;  Laterality: N/A;  . CARDIOVERSION N/A 07/22/2017   Procedure: Cardioversion;  Surgeon: Minna Merritts, MD;  Location: [facility] ORS;  Service: Cardiovascular;  Laterality: N/A;  . CATARACT EXTRACTION W/PHACO Right 09/21/2016   Procedure: CATARACT EXTRACTION PHACO AND INTRAOCULAR LENS PLACEMENT ([place]);  Surgeon: Birder Robson, MD;  Location: [facility] ORS;  Service: Ophthalmology;  Laterality: Right;  [place] 44.1 AP% 16.5 CDE 7.30 Fluid Pack Lot [id number]  . CATARACT EXTRACTION W/PHACO Left 10/19/2016   Procedure: CATARACT EXTRACTION PHACO AND INTRAOCULAR LENS PLACEMENT (IOC);  Surgeon: Birder Robson, MD;  Location: [facility];  Service: Ophthalmology;  Laterality: Left;  [place] 53.7 AP% 19.5 CDE 10.45 Fluid pack lot # [id number]  . CHOLECYSTECTOMY    . COMBINED AUGMENTATION MAMMAPLASTY AND ABDOMINOPLASTY    . JOINT REPLACEMENT Left 06/04/2013   left knee  . KNEE ARTHROSCOPY Right 08/16/2016   Procedure: ARTHROSCOPY KNEE, tear posterior horn medial meniscus, tear anterior and posterior horns of lateral meniscus, chondromalacia of lateral compartment grade 3 patella and grade 4 medial;  Surgeon: Dereck Leep, MD;  Location: [facility];  Service: Orthopedics;  Laterality: Right;  . LEFT HEART CATH AND  CORONARY ANGIOGRAPHY Left 07/27/2019   Procedure: LEFT HEART CATH AND CORONARY ANGIOGRAPHY;  Surgeon: Iran Ouch, MD;  Location: [facility]IVE [facility];  Service: Cardiovascular;  Laterality: Left;  [name] MASTECTOMY  1986   nipple sparing mastectomy/Bilateral  with silicone  breast implants, s/p saline replacements  . Multiple orthopedic procedures    . NOSE SURGERY    . TEE WITHOUT CARDIOVERSION N/A 09/26/2017   Procedure: TRANSESOPHAGEAL ECHOCARDIOGRAM (TEE);  Surgeon: Pricilla Riffle, MD;  Location: [facility] ENDOSCOPY;  Service: Cardiovascular;  Laterality: N/A;  . TOTAL KNEE ARTHROPLASTY Left     Current Outpatient Medications  Medication Sig Dispense Refill  . acetaminophen (TYLENOL) 500 MG tablet Take 1,000 mg by mouth every 6 (six) hours as needed (pain).     [name] aluminum-magnesium hydroxide 200-200 MG/5ML suspension Take 20 mLs by mouth every 6 (six) hours as needed for indigestion.     . Carboxymethylcellul-Glycerin (LUBRICATING EYE DROPS OP) Place 1 drop into both eyes daily as needed (dry eyes).    . chlorhexidine (PERIDEX) 0.12 % solution Use as directed in the mouth or throat.    . cholecalciferol (VITAMIN D) 1000 units tablet Take 1,000 Units by mouth daily.    . Coenzyme Q10 (COQ10) 200 MG CAPS Take 200 mg by mouth daily.    [name] diltiazem (CARDIZEM CD) 180 MG 24 hr capsule Take 1 capsule (180 mg total) by mouth every evening. 90 capsule 1  . ELIQUIS 5 MG TABS tablet Take 1 tablet (5 mg total) by mouth 2 (two) times daily. 60 tablet 5  . metoprolol tartrate (LOPRESSOR) 50 MG tablet Take 1 tablet (50 mg total) by mouth in the morning, at noon, and at bedtime. 270 tablet 1  . pantoprazole (PROTONIX) 40 MG tablet Take 1 tablet (40 mg total) by mouth daily. 30 tablet 2  . pravastatin (PRAVACHOL) 20 MG tablet TAKE 1 TABLET DAILY 90 tablet 0  . Probiotic Product (PROBIOTIC ADVANCED PO) Take 1 tablet by mouth daily.    . sodium chloride (OCEAN) 0.65 % SOLN nasal spray Place 1 spray into both nostrils daily as needed for congestion.     No current facility-administered medications for this visit.     Allergies:   Iodine, Amiodarone, Biaxin [clarithromycin], Codeine, Digoxin and related, Enalapril, Fluocinonide, Iodinated diagnostic agents, Oxycodone,  Promethazine, Tizanidine hcl, Warfarin and related, and Xarelto [rivaroxaban]   Social History:  The patient  reports that she has never smoked. She has never used smokeless tobacco. She reports that she does not drink alcohol and does not use drugs.   Family History:   family history includes Breast cancer in her maternal grandmother, sister, and sister; Cancer in her father, maternal grandmother, sister, and sister; Diabetes in her brother; Esophageal cancer in her brother; Heart disease in her mother and son.    Review of Systems: Review of Systems  Constitutional: Negative.   HENT: Negative.   Respiratory: Negative.   Cardiovascular: Negative.   Gastrointestinal: Negative.   Musculoskeletal: Negative.   Neurological: Negative.   Psychiatric/Behavioral: Negative.   All other systems reviewed and are negative.    PHYSICAL EXAM: VS:  BP 120/72 (BP Location: Left Arm, Patient Position: Sitting, Cuff Size: Normal)   Pulse 81   Ht 5\' 4"  (1.626 m)   Wt 162 lb 4 oz (73.6 kg)   SpO2 98%   BMI 27.85 kg/m  , BMI Body mass index is 27.85 kg/m. GEN: Well nourished, well developed, in no acute distress HEENT: normal  Neck: no JVD, carotid bruits, or masses Cardiac: RRR; no murmurs, rubs, or gallops,no edema  Respiratory:  clear to auscultation bilaterally, normal work of breathing GI: soft, nontender, nondistended, + BS MS: no deformity or atrophy Skin: warm and dry, no rash Neuro:  Strength and sensation are intact Psych: euthymic mood, full affect   Recent Labs: 05/15/2020: ALT 18; BUN 16; Creatinine, Ser 0.78; Hemoglobin 13.7; Platelets 254.0; Potassium 4.7; Sodium 133; TSH 0.55    Lipid Panel Lab Results  Component Value Date   CHOL 139 05/15/2020   HDL 53.80 05/15/2020   LDLCALC 70 05/15/2020   TRIG 78.0 05/15/2020      Wt Readings from Last 3 Encounters:  12/17/20 162 lb 4 oz (73.6 kg)  09/04/20 160 lb (72.6 kg)  08/31/20 161 lb (73 kg)       ASSESSMENT AND  PLAN:  Problem List Items Addressed This Visit      Cardiology Problems   Essential hypertension   Persistent atrial fibrillation (HCC)   Tachycardia induced cardiomyopathy ([place])   Pulmonary hypertension, moderate to severe ([place]) - Primary    Other Visit Diagnoses    Chest pain of uncertain etiology         Chest pain Notes indicating prior history of chest pain Atypical in nature, unable to exclude musculoskeletal versus GI etiology Was lifting heavy bags of salt 1 to 2 days ago She is unclear if it was uncomfortable with palpation Prior cardiac catheterization no significant disease EKG unchanged, currently feeling well with no symptoms Medical management recommended Recommended she call our office if she has recurrent symptoms  Hyperlipidemia Cholesterol is at goal on the current lipid regimen. No changes to the medications were made.  Atrial fibrillation Rate relatively well controlled, on anticoagulation   Total encounter time more than 45 minutes  Greater than 50% was spent in counseling and coordination of care with the patient    Signed, Esmond Plants, M.D., Ph.D. [place], [place]

## 2020-12-30 ENCOUNTER — Encounter: Payer: Self-pay | Admitting: Internal Medicine

## 2020-12-30 ENCOUNTER — Telehealth: Payer: Self-pay | Admitting: Internal Medicine

## 2020-12-30 ENCOUNTER — Telehealth (INDEPENDENT_AMBULATORY_CARE_PROVIDER_SITE_OTHER): Payer: Medicare Other | Admitting: Internal Medicine

## 2020-12-30 ENCOUNTER — Other Ambulatory Visit (INDEPENDENT_AMBULATORY_CARE_PROVIDER_SITE_OTHER): Payer: Medicare Other

## 2020-12-30 ENCOUNTER — Other Ambulatory Visit: Payer: Self-pay

## 2020-12-30 DIAGNOSIS — R197 Diarrhea, unspecified: Secondary | ICD-10-CM | POA: Diagnosis not present

## 2020-12-30 DIAGNOSIS — R1110 Vomiting, unspecified: Secondary | ICD-10-CM

## 2020-12-30 DIAGNOSIS — B349 Viral infection, unspecified: Secondary | ICD-10-CM

## 2020-12-30 LAB — POCT INFLUENZA A/B
Influenza A, POC: NEGATIVE
Influenza B, POC: NEGATIVE

## 2020-12-30 NOTE — Telephone Encounter (Signed)
Pt called she is having diarrhea, vomiting and a headache she is wanting to be covid tested  No appts available

## 2020-12-30 NOTE — Telephone Encounter (Signed)
Is it okay to go ahead and schedule pt for covid and flu testing and then schedule for a virtual on Friday?

## 2020-12-30 NOTE — Telephone Encounter (Signed)
Spoke with pt and scheduled her for covid/flu testing and a telephone visit this afternoon with Dr. Derrel Nip.

## 2020-12-30 NOTE — Progress Notes (Signed)
Telephone  Note  This visit type was conducted due to national recommendations for restrictions regarding the COVID-19 pandemic (e.g. social distancing).  This format is felt to be most appropriate for this patient at this time.  All issues noted in this document were discussed and addressed.  No physical exam was performed (except for noted visual exam findings with Video Visits).   I connected with@ on 12/30/20 at  4:00 PM EST by  telephone and verified that I am speaking with the correct person using two identifiers. Location patient: home Location provider: work or home office Persons participating in the virtual visit: patient, provider  I discussed the limitations, risks, security and privacy concerns of performing an evaluation and management service by telephone and the availability of in person appointments. I also discussed with the patient that there may be a patient responsible charge related to this service. The patient expressed understanding and agreed to proceed.   Reason for visit: illness  HPI:  85 yr old female with atrial fibrillation , prediabetes presents with s/s of COVID 19 infection. Symptoms started on Jan 10 with dizziness and and abdominal pain, which progressed to nausea,  vomiting and diarrhea .  Reports having 3 liquid stools yesterday .  Today's stool was more formed and much smaller volume. .  She ate a regular diet today including  Cereal with milk and  orange juice today , chick Fil A chicken nuggets, with recurrent abdominal but no nausea.   Dizziness has improved,  Resolving  Headache with tylenol.  No recent sick contacts. [place] over the holidays , but son visited them  yesterday afternoon.    ROS: See pertinent positives and negatives per HPI.  Past Medical History:  Diagnosis Date  . Arthritis   . Atrial flutter ([place]) 02/2011   s/p cardioversion   . Chest pain    a. H/o cardiac cath x 2-> 2012 -->nl cors;  b. 12/2016 MV: EF 61%, small region  of mild perfusion defect in the apical anteroseptal region c/w breast attenuation, no ischemia-->Low risk; c. 06/2019 MV: Mod size, mild inflat ischemia, EF 59%. Cor and Ao Ca2+. Inflat defect more pronounced on this study compared to last; c. 07/2019 Cath: LM nl, LAD min irregs, LCX nl, OM1/2/3 nl, RCA min irregs.  . CKD (chronic kidney disease), stage III ([place])   . Concussion with no loss of consciousness 09/01/2016  . Cystocele   . GERD (gastroesophageal reflux disease)   . Headache(784.0)    chronic  . Hyperlipidemia   . Hypertension   . Influenza 01/10/2017  . Knee fracture   . Mobitz type 2 second degree atrioventricular block    a. felt to be 2/2 amiodarone, resolved with decreased amiodarone dose.  Amio since d/c'd.  . Permanent atrial fibrillation ([place])    a. status post multiple DCCVs; b. 2018 - eval for PVI but opted for rate control.  [name] PONV (postoperative nausea and vomiting)    oxycodone and codiene cause N/V   . Pre-syncope    a. In setting of dehydration and AKI in the past.  . Squamous cell carcinoma of skin 11/10/2020   left distal posterior deltoid  . Tachycardia induced cardiomyopathy ([place])    a. Resolved;  b. 08/2017 Echo: EF 50-55%, no rwma, mild MR, mildly to mod dil LA/RA; c. 02/2018 Echo: EF 55-60%, mild MR. Mildly dil LA. Nl RVSP. PASP 53mmHg.  [name] Venous insufficiency   . Vertigo     Past Surgical  History:  Procedure Laterality Date  . ABDOMINAL HYSTERECTOMY  1990  . APPENDECTOMY    . AUGMENTATION MAMMAPLASTY Bilateral 1986   implants  . AUGMENTATION MAMMAPLASTY  1990  . AUGMENTATION MAMMAPLASTY  2011  . CARDIAC CATHETERIZATION    . CARDIOVERSION     x 3  . CARDIOVERSION    . CARDIOVERSION N/A 02/07/2017   Procedure: CARDIOVERSION;  Surgeon: Wellington Hampshire, MD;  Location: [facility];  Service: Cardiovascular;  Laterality: N/A;  . CARDIOVERSION N/A 07/22/2017   Procedure: Cardioversion;  Surgeon: Minna Merritts, MD;  Location: [facility] ORS;  Service:  Cardiovascular;  Laterality: N/A;  . CATARACT EXTRACTION W/PHACO Right 09/21/2016   Procedure: CATARACT EXTRACTION PHACO AND INTRAOCULAR LENS PLACEMENT ([place]);  Surgeon: Birder Robson, MD;  Location: [facility] ORS;  Service: Ophthalmology;  Laterality: Right;  [place] 44.1 AP% 16.5 CDE 7.30 Fluid Pack Lot #[id number]  . CATARACT EXTRACTION W/PHACO Left 10/19/2016   Procedure: CATARACT EXTRACTION PHACO AND INTRAOCULAR LENS PLACEMENT (IOC);  Surgeon: Birder Robson, MD;  Location: [facility] ORS;  Service: Ophthalmology;  Laterality: Left;  [place] 53.7 AP% 19.5 CDE 10.45 Fluid pack lot # [id number]  . CHOLECYSTECTOMY    . COMBINED AUGMENTATION MAMMAPLASTY AND ABDOMINOPLASTY    . JOINT REPLACEMENT Left 06/04/2013   left knee  . KNEE ARTHROSCOPY Right 08/16/2016   Procedure: ARTHROSCOPY KNEE, tear posterior horn medial meniscus, tear anterior and posterior horns of lateral meniscus, chondromalacia of lateral compartment grade 3 patella and grade 4 medial;  Surgeon: Dereck Leep, MD;  Location: [facility] ORS;  Service: Orthopedics;  Laterality: Right;  . LEFT HEART CATH AND CORONARY ANGIOGRAPHY Left 07/27/2019   Procedure: LEFT HEART CATH AND CORONARY ANGIOGRAPHY;  Surgeon: Wellington Hampshire, MD;  Location: [facility] LAB;  Service: Cardiovascular;  Laterality: Left;  [name] MASTECTOMY  1986   nipple sparing mastectomy/Bilateral with silicone  breast implants, s/p saline replacements  . Multiple orthopedic procedures    . NOSE SURGERY    . TEE WITHOUT CARDIOVERSION N/A 09/26/2017   Procedure: TRANSESOPHAGEAL ECHOCARDIOGRAM (TEE);  Surgeon: Fay Records, MD;  Location: [facility]COPY;  Service: Cardiovascular;  Laterality: N/A;  . TOTAL KNEE ARTHROPLASTY Left     Family History  Problem Relation Age of Onset  . Heart disease Mother   . Cancer Father        stomach  . Heart disease Son        found at autopsy  . Cancer Sister        breast  . Breast cancer Sister   . Cancer Maternal Grandmother        breast  .  Breast cancer Maternal Grandmother   . Cancer Sister        breast  . Breast cancer Sister   . Diabetes Brother   . Esophageal cancer Brother   . Ovarian cancer Neg Hx     SOCIAL HX:  reports that she has never smoked. She has never used smokeless tobacco. She reports that she does not drink alcohol and does not use drugs.   Current Outpatient Medications:  .  acetaminophen (TYLENOL) 500 MG tablet, Take 1,000 mg by mouth every 6 (six) hours as needed (pain). , Disp: , Rfl:  .  aluminum-magnesium hydroxide 200-200 MG/5ML suspension, Take 20 mLs by mouth every 6 (six) hours as needed for indigestion. , Disp: , Rfl:  .  Carboxymethylcellul-Glycerin (LUBRICATING EYE DROPS OP), Place 1 drop into both eyes daily as needed (dry eyes)., Disp: ,  Rfl:  .  chlorhexidine (PERIDEX) 0.12 % solution, Use as directed in the mouth or throat., Disp: , Rfl:  .  cholecalciferol (VITAMIN D) 1000 units tablet, Take 1,000 Units by mouth daily., Disp: , Rfl:  .  Coenzyme Q10 (COQ10) 200 MG CAPS, Take 200 mg by mouth daily., Disp: , Rfl:  .  diltiazem (CARDIZEM CD) 180 MG 24 hr capsule, Take 1 capsule (180 mg total) by mouth every evening., Disp: 90 capsule, Rfl: 1 .  ELIQUIS 5 MG TABS tablet, Take 1 tablet (5 mg total) by mouth 2 (two) times daily., Disp: 60 tablet, Rfl: 5 .  metoprolol tartrate (LOPRESSOR) 50 MG tablet, Take 1 tablet (50 mg total) by mouth in the morning, at noon, and at bedtime., Disp: 270 tablet, Rfl: 1 .  pantoprazole (PROTONIX) 40 MG tablet, Take 1 tablet (40 mg total) by mouth daily., Disp: 30 tablet, Rfl: 2 .  pravastatin (PRAVACHOL) 20 MG tablet, TAKE 1 TABLET DAILY, Disp: 90 tablet, Rfl: 0 .  Probiotic Product (PROBIOTIC ADVANCED PO), Take 1 tablet by mouth daily., Disp: , Rfl:  .  sodium chloride (OCEAN) 0.65 % SOLN nasal spray, Place 1 spray into both nostrils daily as needed for congestion., Disp: , Rfl:   EXAM:   General impression: alert, cooperative and articulate.  No signs of  being in distress  Lungs: speech is fluent sentence length suggests that patient is not short of breath and not punctuated by cough, sneezing or sniffing. [name]   Psych: affect normal.  speech is articulate and non pressured .  Denies suicidal thoughts   ASSESSMENT AND PLAN:  Discussed the following assessment and plan:  Viral illness  Viral illness influenza test is negative.  covid test is pending. She is afebrile.  Advised to isolate until results of test are known.  she is fully vaccinated.      I discussed the assessment and treatment plan with the patient. The patient was provided an opportunity to ask questions and all were answered. The patient agreed with the plan and demonstrated an understanding of the instructions.   The patient was advised to call back or seek an in-person evaluation if the symptoms worsen or if the condition fails to improve as anticipated.  I provided 15 minutes of non-face-to-face time during this encounter.   [name], MD

## 2020-12-30 NOTE — Telephone Encounter (Signed)
Yes please schedule her today for covid and flu testing . I will look at my schedule for the next 2 days and see if slots can be made available.

## 2020-12-30 NOTE — Patient Instructions (Signed)
Ask your family to get you the following vitamins  Vitamin C 1000 mg  Daily  Vitamin D 4000 Ius daily  Zinc 50 mg daily   You can TAKE up to 2000 mg of acetominophen (tylenol) every day safely  In divided doses (500 mg every 6 hours  Or 1000 mg every 12 hours.)  YOU SHOULD BE FEVER FREE FOR 24 HOURS WITHOUT THE USE OF ADVIL OR MOTRIN ,  AND ALL SYMPTOMS NEED TO BE IMPROVING BEFORE YOU CAN BREAK QUARANTINE (MINIMUM OF 7 DAYS  FROM DAY 1 OF SYMPTOMS )

## 2020-12-30 NOTE — Assessment & Plan Note (Signed)
influenza test is negative.  covid test is pending. She is afebrile.  Advised to isolate until results of test are known.  she is fully vaccinated.

## 2020-12-30 NOTE — Telephone Encounter (Signed)
Orders placed for testing.

## 2020-12-30 NOTE — Addendum Note (Signed)
Addended by: Adair Laundry on: 12/30/2020 01:36 PM   Modules accepted: Orders

## 2020-12-31 ENCOUNTER — Telehealth: Payer: Self-pay

## 2020-12-31 NOTE — Telephone Encounter (Signed)
The 'BRAT' diet is suggested, then progress to diet as tolerated as symptoms abate.  Avoid raw vegetables and dairu until stools are solid     Bananas  Rice   Applesauce  toast

## 2020-12-31 NOTE — Telephone Encounter (Signed)
Pt called to follow up on message below.

## 2020-12-31 NOTE — Telephone Encounter (Signed)
Would you like for me to suggest the BRAT diet to them and that they only need to do this until their feeling better?

## 2020-12-31 NOTE — Telephone Encounter (Signed)
Spoke with pt and informed her of the brat diet that her and husband can both try until their stools are solid again.

## 2020-12-31 NOTE — Telephone Encounter (Signed)
Pt states that she wants Dr Derrel Nip to write out a meal plan for her and her husband. She states that they can not live off of soup and apple juice. She wants a call back from [place] today to clarify what she would like in letter format to come pick up?

## 2021-01-02 LAB — NOVEL CORONAVIRUS, NAA: SARS-CoV-2, NAA: NOT DETECTED

## 2021-01-02 LAB — SARS-COV-2, NAA 2 DAY TAT

## 2021-01-02 NOTE — Progress Notes (Signed)
Your covid 19 test was negative.   Regards,   Deborra Medina, MD

## 2021-01-15 ENCOUNTER — Encounter: Payer: Self-pay | Admitting: Dermatology

## 2021-01-15 ENCOUNTER — Other Ambulatory Visit: Payer: Self-pay

## 2021-01-15 ENCOUNTER — Ambulatory Visit: Payer: Medicare Other | Admitting: Dermatology

## 2021-01-15 ENCOUNTER — Other Ambulatory Visit: Payer: Self-pay | Admitting: Cardiovascular Disease

## 2021-01-15 DIAGNOSIS — L570 Actinic keratosis: Secondary | ICD-10-CM | POA: Diagnosis not present

## 2021-01-15 DIAGNOSIS — D0462 Carcinoma in situ of skin of left upper limb, including shoulder: Secondary | ICD-10-CM

## 2021-01-15 DIAGNOSIS — D099 Carcinoma in situ, unspecified: Secondary | ICD-10-CM

## 2021-01-15 DIAGNOSIS — L820 Inflamed seborrheic keratosis: Secondary | ICD-10-CM | POA: Diagnosis not present

## 2021-01-15 DIAGNOSIS — L578 Other skin changes due to chronic exposure to nonionizing radiation: Secondary | ICD-10-CM | POA: Diagnosis not present

## 2021-01-15 NOTE — Telephone Encounter (Signed)
Prescription refill request for Eliquis received. Indication: Atrial Fibrillation Last office visit: 12/17/20 Scr:0.78 on 05/15/20 Age: 85 Weight: 73.6kg

## 2021-01-15 NOTE — Patient Instructions (Addendum)
Wound Care Instructions  1. Cleanse wound gently with soap and water once a day then pat dry with clean gauze. Apply a thing coat of Petrolatum (petroleum jelly, "Vaseline") over the wound (unless you have an allergy to this). We recommend that you use a new, sterile tube of Vaseline. Do not pick or remove scabs. Do not remove the yellow or white "healing tissue" from the base of the wound.  2. Cover the wound with fresh, clean, nonstick gauze and secure with paper tape. You may use Band-Aids in place of gauze and tape if the would is small enough, but would recommend trimming much of the tape off as there is often too much. Sometimes Band-Aids can irritate the skin.  3. You should call the office for your biopsy report after 1 week if you have not already been contacted.  4. If you experience any problems, such as abnormal amounts of bleeding, swelling, significant bruising, significant pain, or evidence of infection, please call the office immediately.  5. FOR ADULT SURGERY PATIENTS: If you need something for pain relief you may take 1 extra strength Tylenol (acetaminophen) AND 2 Ibuprofen (200mg  each) together every 4 hours as needed for pain. (do not take these if you are allergic to them or if you have a reason you should not take them.) Typically, you may only need pain medication for 1 to 3 days.    Recommend Cerave cream

## 2021-01-15 NOTE — Progress Notes (Addendum)
° °  Follow-Up Visit   Subjective  [name] [name] is a 85 y.o. female who presents for the following: Skin Cancer (Biopsy proven SCC in situ of left distal post deltoid - EDC today). Accompanied by husband who contributes to history.  The following portions of the chart were reviewed this encounter and updated as appropriate:   Tobacco   Allergies   Meds   Problems   Med Hx   Surg Hx   Fam Hx      Review of Systems:  No other skin or systemic complaints except as noted in HPI or Assessment and Plan.  Objective  Well appearing patient in no apparent distress; mood and affect are within normal limits.  A focused examination was performed including face, left arm. Relevant physical exam findings are noted in the Assessment and Plan.  Objective  Face (6): Erythematous thin papules/macules with gritty scale.   Objective  Face: Erythematous keratotic or waxy stuck-on papule or plaque.   Objective  Right distal post deltoid: Healing biopsy site   Assessment & Plan    Actinic Damage - chronic, secondary to cumulative UV radiation exposure/sun exposure over time - diffuse scaly erythematous macules with underlying dyspigmentation - Recommend daily broad spectrum sunscreen SPF 30+ to sun-exposed areas, reapply every 2 hours as needed.  - Call for new or changing lesions.  AK (actinic keratosis) (6) Face  Destruction of lesion - Face Complexity: simple   Destruction method: cryotherapy   Informed consent: discussed and consent obtained   Timeout:  patient name, date of birth, surgical site, and procedure verified Lesion destroyed using liquid nitrogen: Yes   Region frozen until ice ball extended beyond lesion: Yes   Outcome: patient tolerated procedure well with no complications   Post-procedure details: wound care instructions given    Inflamed seborrheic keratosis Face  Destruction of lesion - Face Complexity: simple   Destruction method: cryotherapy   Informed consent:  discussed and consent obtained   Timeout:  patient name, date of birth, surgical site, and procedure verified Lesion destroyed using liquid nitrogen: Yes   Region frozen until ice ball extended beyond lesion: Yes   Outcome: patient tolerated procedure well with no complications   Post-procedure details: wound care instructions given    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  Destruction of lesion Complexity: extensive   Destruction method: electrodesiccation and curettage   Informed consent: discussed and consent obtained   Timeout:  patient name, date of birth, surgical site, and procedure verified Procedure prep:  Patient was prepped and draped in usual sterile fashion Prep type:  Isopropyl alcohol Anesthesia: the lesion was anesthetized in a standard fashion   Anesthetic:  1% lidocaine w/ epinephrine 1-100,000 buffered w/ 8.4% NaHCO3 Curettage performed in three different directions: Yes   Electrodesiccation performed over the curetted area: Yes   Final wound size (cm):  1.5 Hemostasis achieved with:  pressure and aluminum chloride Outcome: patient tolerated procedure well with no complications   Post-procedure details: sterile dressing applied and wound care instructions given   Dressing type: bandage and petrolatum    Biopsy proven SCC in situ  Return in about 6 months (around 07/15/2021).  I, Ashok Cordia, CMA, am acting as scribe for Sarina Ser, MD .  Documentation: I have reviewed the above documentation for accuracy and completeness, and I agree with the above.  Sarina Ser, MD

## 2021-01-28 ENCOUNTER — Encounter: Payer: Self-pay | Admitting: Dermatology

## 2021-01-28 NOTE — Progress Notes (Signed)
Cardiology Office Note   Date:  01/29/2021   ID:  [name] [name], DOB [birth year], MRN [id number]  PCP:  Sherlene Shams, MD  Cardiologist:   Lorine Bears, MD   Chief Complaint  Patient presents with  . Follow-up    Chest Pain       History of Present Illness: [name] [name] is a 85 y.o. female who presents for a followup visit regarding permanent atrial fibrillation.   She has known history of  atrial fibrillation/flutter status post multiple cardioversions in the past (total of 7). 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  Most recent cardioversion was in Feb of 2018. Echocardiogram in March, 2019 showed an EF of 55 to 60% with mildly dilated left atrium and minimal pulmonary hypertension. She is being treated with rate control at the present time.   Most recent ischemic cardiac evaluation was in July 2020 which included a Lexiscan Myoview which showed possible inferolateral ischemia with normal ejection fraction.  Cardiac catheterization was followed in August which showed minimal irregularities with no evidence of obstructive coronary artery disease, normal ejection fraction mildly elevated left ventricular end-diastolic pressure.   She takes diltiazem extended release 180 mg once daily and metoprolol 50 mg twice daily and occasionally she takes a third dose if needed for blood pressure and heart rate. She was added to my schedule today for evaluation of chest pain.  She reports substernal chest and upper epigastric discomfort that is described as burning sensation with frequent belching and burping.  Most of the time that happens after she eats but can happen without correlation to food.  It is definitely not exertional.  Atrial fibrillation has been controlled.  She has some exertional dyspnea.  Past Medical History:  Diagnosis Date  . Arthritis   . Atrial flutter (HCC) 02/2011   s/p cardioversion   . Chest pain    a. H/o cardiac cath  x 2-> 2012 -->nl cors;  b. 12/2016 MV: EF 61%, small region of mild perfusion defect in the apical anteroseptal region c/w breast attenuation, no ischemia-->Low risk; c. 06/2019 MV: Mod size, mild inflat ischemia, EF 59%. Cor and Ao Ca2+. Inflat defect more pronounced on this study compared to last; c. 07/2019 Cath: LM nl, LAD min irregs, LCX nl, OM1/2/3 nl, RCA min irregs.  . CKD (chronic kidney disease), stage III (HCC)   . Concussion with no loss of consciousness 09/01/2016  . Cystocele   . GERD (gastroesophageal reflux disease)   . Headache(784.0)    chronic  . Hyperlipidemia   . Hypertension   . Influenza 01/10/2017  . Knee fracture   . Mobitz type 2 second degree atrioventricular block    a. felt to be 2/2 amiodarone, resolved with decreased amiodarone dose.  Amio since d/c'd.  . Permanent atrial fibrillation (HCC)    a. status post multiple DCCVs; b. 2018 - eval for PVI but opted for rate control.  [name] PONV (postoperative nausea and vomiting)    oxycodone and codiene cause N/V   . Pre-syncope    a. In setting of dehydration and AKI in the past.  . Squamous cell carcinoma of skin 11/10/2020   left distal posterior deltoid (EDC 01/15/2021)  . Tachycardia induced cardiomyopathy (HCC)    a. Resolved;  b. 08/2017 Echo: EF 50-55%, no rwma, mild MR, mildly to mod dil LA/RA; c. 02/2018 Echo: EF 55-60%, mild MR. Mildly dil LA. Nl RVSP. PASP .  [name] Venous insufficiency   .  Vertigo     Past Surgical History:  Procedure Laterality Date  . ABDOMINAL HYSTERECTOMY  1990  . APPENDECTOMY    . AUGMENTATION MAMMAPLASTY Bilateral 1986   implants  . AUGMENTATION MAMMAPLASTY  1990  . AUGMENTATION MAMMAPLASTY  2011  . CARDIAC CATHETERIZATION    . CARDIOVERSION     x 3  . CARDIOVERSION    . CARDIOVERSION N/A 02/07/2017   Procedure: CARDIOVERSION;  Surgeon: Iran Ouch, MD;  Location: [facility] ORS;  Service: Cardiovascular;  Laterality: N/A;  . CARDIOVERSION N/A 07/22/2017   Procedure: Cardioversion;   Surgeon: Antonieta Iba, MD;  Location: [facility] ORS;  Service: Cardiovascular;  Laterality: N/A;  . CATARACT EXTRACTION W/PHACO Right 09/21/2016   Procedure: CATARACT EXTRACTION PHACO AND INTRAOCULAR LENS PLACEMENT (IOC);  Surgeon: Galen Manila, MD;  Location: [facility] ORS;  Service: Ophthalmology;  Laterality: Right;  [place] 44.1 AP% 16.5 CDE 7.30 Fluid Pack Lot #[id number]  . CATARACT EXTRACTION W/PHACO Left 10/19/2016   Procedure: CATARACT EXTRACTION PHACO AND INTRAOCULAR LENS PLACEMENT (IOC);  Surgeon: Galen Manila, MD;  Location: [facility] ORS;  Service: Ophthalmology;  Laterality: Left;  [place] 53.7 AP% 19.5 CDE 10.45 Fluid pack lot # [id number]  . CHOLECYSTECTOMY    . COMBINED AUGMENTATION MAMMAPLASTY AND ABDOMINOPLASTY    . JOINT REPLACEMENT Left 06/04/2013   left knee  . KNEE ARTHROSCOPY Right 08/16/2016   Procedure: ARTHROSCOPY KNEE, tear posterior horn medial meniscus, tear anterior and posterior horns of lateral meniscus, chondromalacia of lateral compartment grade 3 patella and grade 4 medial;  Surgeon: Donato Heinz, MD;  Location: [facility];  Service: Orthopedics;  Laterality: Right;  . LEFT HEART CATH AND CORONARY ANGIOGRAPHY Left 07/27/2019   Procedure: LEFT HEART CATH AND CORONARY ANGIOGRAPHY;  Surgeon: Iran Ouch, MD;  Location: [facility] INVASIVE CV LAB;  Service: Cardiovascular;  Laterality: Left;  [name] MASTECTOMY  1986   nipple sparing mastectomy/Bilateral with silicone  breast implants, s/p saline replacements  . Multiple orthopedic procedures    . NOSE SURGERY    . TEE WITHOUT CARDIOVERSION N/A 09/26/2017   Procedure: TRANSESOPHAGEAL ECHOCARDIOGRAM (TEE);  Surgeon: Pricilla Riffle, MD;  Location: [facility] ENDOSCOPY;  Service: Cardiovascular;  Laterality: N/A;  . TOTAL KNEE ARTHROPLASTY Left      Current Outpatient Medications  Medication Sig Dispense Refill  . acetaminophen (TYLENOL) 500 MG tablet Take 1,000 mg by mouth every 6 (six) hours as needed (pain).     [name] aluminum-magnesium  hydroxide 200-200 MG/5ML suspension Take 20 mLs by mouth every 6 (six) hours as needed for indigestion.     . Carboxymethylcellul-Glycerin (LUBRICATING EYE DROPS OP) Place 1 drop into both eyes daily as needed (dry eyes).    . chlorhexidine (PERIDEX) 0.12 % solution Use as directed in the mouth or throat.    . cholecalciferol (VITAMIN D) 1000 units tablet Take 1,000 Units by mouth daily.    . Coenzyme Q10 (COQ10) 200 MG CAPS Take 200 mg by mouth daily.    [name] diltiazem (CARDIZEM CD) 180 MG 24 hr capsule Take 1 capsule (180 mg total) by mouth every evening. 90 capsule 1  . ELIQUIS 5 MG TABS tablet Take 1 tablet (5 mg total) by mouth 2 (two) times daily. 60 tablet 3  . metoprolol tartrate (LOPRESSOR) 50 MG tablet Take 1 tablet (50 mg total) by mouth in the morning, at noon, and at bedtime. 270 tablet 1  . pantoprazole (PROTONIX) 40 MG tablet Take 1 tablet (40 mg total) by mouth daily. 30 tablet  2  . pravastatin (PRAVACHOL) 20 MG tablet TAKE 1 TABLET DAILY 90 tablet 0  . Probiotic Product (PROBIOTIC ADVANCED PO) Take 1 tablet by mouth daily.    . sodium chloride (OCEAN) 0.65 % SOLN nasal spray Place 1 spray into both nostrils daily as needed for congestion.     No current facility-administered medications for this visit.    Allergies:   Iodine, Amiodarone, Biaxin [clarithromycin], Codeine, Digoxin and related, Enalapril, Fluocinonide, Iodinated diagnostic agents, Oxycodone, Promethazine, Tizanidine hcl, Warfarin and related, and Xarelto [rivaroxaban]    Social History:  The patient  reports that she has never smoked. She has never used smokeless tobacco. She reports that she does not drink alcohol and does not use drugs.   Family History:  The patient's family history includes Breast cancer in her maternal grandmother, sister, and sister; Cancer in her father, maternal grandmother, sister, and sister; Diabetes in her brother; Esophageal cancer in her brother; Heart disease in her mother and son.     ROS:  Please see the history of present illness.   Otherwise, review of systems are positive for none.   All other systems are reviewed and negative.    PHYSICAL EXAM: VS:  BP 136/72   Pulse 78   Ht 5\' 4"  (1.626 m)   Wt 160 lb (72.6 kg)   BMI 27.46 kg/m  , BMI Body mass index is 27.46 kg/m. GEN: Well nourished, well developed, in no acute distress  HEENT: Left temporal lobe tenderness Neck: no JVD, carotid bruits, or masses Cardiac: Irregularly irregular; no murmurs, rubs, or gallops,no edema  Respiratory:  clear to auscultation bilaterally, normal work of breathing GI: soft, nontender, nondistended, + BS MS: no deformity or atrophy  Skin: warm and dry, no rash Neuro:  Strength and sensation are intact Psych: euthymic mood, full affect   EKG:  EKG is ordered today. The ekg ordered today demonstrates atrial fibrillation with right bundle branch block and chronic anterolateral T wave changes.  No new changes compared to most recent EKG.   Recent Labs: 05/15/2020: ALT 18; BUN 16; Creatinine, Ser 0.78; Hemoglobin 13.7; Platelets 254.0; Potassium 4.7; Sodium 133; TSH 0.55    Lipid Panel    Component Value Date/Time   CHOL 139 05/15/2020 1441   TRIG 78.0 05/15/2020 1441   HDL 53.80 05/15/2020 1441   CHOLHDL 3 05/15/2020 1441   VLDL 15.6 05/15/2020 1441   LDLCALC 70 05/15/2020 1441      Wt Readings from Last 3 Encounters:  01/29/21 160 lb (72.6 kg)  12/30/20 162 lb (73.5 kg)  12/17/20 162 lb 4 oz (73.6 kg)       ASSESSMENT AND PLAN:  1.  Permanent atrial fibrillation: Ventricular rate is reasonably controlled on diltiazem and metoprolol.  She takes metoprolol 50 mg twice daily but occasionally she takes a third dose as needed.  She is tolerating anticoagulation with no side effects.  I requested CBC and basic metabolic profile.  2. Essential hypertension: Blood pressure is reasonably controlled.  3. History of tachycardia-induced cardiomyopathy. Most recent  ejection fraction was 55%.  Given complaint of chest pain and shortness of breath, I requested a follow-up echocardiogram.  4. Left carotid calcifications: Carotid Doppler in August of 2018 showed mild nonobstructive disease.  5.  Chest pain: Does not seem to be cardiac.  Cardiac catheterization multiple times so showed no evidence of coronary artery disease.  Her symptoms are suggestive of a GI etiology.  She reports persistent symptoms for more than  3 months despite of taking Protonix.  Thus, I am going to refer her to gastroenterology to consider an EGD.   Disposition:   FU with me in 6 months    Signed, Lorine Bears, MD 01/29/21 [facility] [place], [place] [phone]

## 2021-01-29 ENCOUNTER — Other Ambulatory Visit: Payer: Self-pay

## 2021-01-29 ENCOUNTER — Ambulatory Visit (INDEPENDENT_AMBULATORY_CARE_PROVIDER_SITE_OTHER): Payer: Medicare Other | Admitting: Cardiovascular Disease

## 2021-01-29 ENCOUNTER — Encounter: Payer: Self-pay | Admitting: Cardiovascular Disease

## 2021-01-29 ENCOUNTER — Telehealth: Payer: Self-pay | Admitting: Cardiovascular Disease

## 2021-01-29 VITALS — BP 136/72 | HR 78 | Ht 64.0 in | Wt 160.0 lb

## 2021-01-29 DIAGNOSIS — R109 Unspecified abdominal pain: Secondary | ICD-10-CM

## 2021-01-29 DIAGNOSIS — I6522 Occlusion and stenosis of left carotid artery: Secondary | ICD-10-CM

## 2021-01-29 DIAGNOSIS — I4821 Permanent atrial fibrillation: Secondary | ICD-10-CM

## 2021-01-29 DIAGNOSIS — R0602 Shortness of breath: Secondary | ICD-10-CM

## 2021-01-29 DIAGNOSIS — R079 Chest pain, unspecified: Secondary | ICD-10-CM | POA: Diagnosis not present

## 2021-01-29 DIAGNOSIS — Z8679 Personal history of other diseases of the circulatory system: Secondary | ICD-10-CM

## 2021-01-29 DIAGNOSIS — I10 Essential (primary) hypertension: Secondary | ICD-10-CM | POA: Diagnosis not present

## 2021-01-29 NOTE — Patient Instructions (Signed)
Medication Instructions:  Your physician recommends that you continue on your current medications as directed. Please refer to the Current Medication list given to you today.  *If you need a refill on your cardiac medications before your next appointment, please call your pharmacy*   Lab Work: Bmet and Cbc today If you have labs (blood work) drawn today and your tests are completely normal, you will receive your results only by: [name] MyChart Message (if you have MyChart) OR . A paper copy in the mail If you have any lab test that is abnormal or we need to change your treatment, we will call you to review the results.   Testing/Procedures: Your physician has requested that you have an echocardiogram. Echocardiography is a painless test that uses sound waves to create images of your heart. It provides your doctor with information about the size and shape of your heart and how well your heart's chambers and valves are working. This procedure takes approximately one hour. There are no restrictions for this procedure.     Follow-Up: At [facility], you and your health needs are our priority.  As part of our continuing mission to provide you with exceptional heart care, we have created designated Provider Care Teams.  These Care Teams include your primary Cardiologist (physician) and Advanced Practice Providers (APPs -  Physician Assistants and Nurse Practitioners) who all work together to provide you with the care you need, when you need it.  We recommend signing up for the patient portal called "MyChart".  Sign up information is provided on this After Visit Summary.  MyChart is used to connect with patients for Virtual Visits (Telemedicine).  Patients are able to view lab/test results, encounter notes, upcoming appointments, etc.  Non-urgent messages can be sent to your provider as well.   To learn more about what you can do with MyChart, go to [email or web address].    Your next appointment:    6 month(s)  The format for your next appointment:   In Person  Provider:   You may see [name], MD or one of the following Advanced Practice Providers on your designated Care Team:    Murray Hodgkins, NP  Christell Faith, PA-C  Marrianne Mood, PA-C  Cadence [place], [place]  Laurann Montana, NP    Other Instructions You have been referred to Dr. Hilarie Fredrickson with [facility]. His office will contact you to schedule the appointment.

## 2021-01-29 NOTE — Telephone Encounter (Signed)
Spoke with the patient. Advised her to contact Dr. Vena Rua office at [facility] to schedule her and her husbands appt with their office.  Pt verbalized understanding and voiced appreciation for the call.

## 2021-01-29 NOTE — Telephone Encounter (Signed)
Please call patient regarding a GI referral to Dr. Hilarie Fredrickson for EGD. Please call to discuss.patient would like this to be scheduled the same time as her husband needing a colonoscopy.

## 2021-01-30 LAB — BASIC METABOLIC PANEL
BUN/Creatinine Ratio: 19 (ref 12–28)
BUN: 15 mg/dL (ref 8–27)
CO2: 23 mmol/L (ref 20–29)
Calcium: 9.6 mg/dL (ref 8.7–10.3)
Chloride: 102 mmol/L (ref 96–106)
Creatinine, Ser: 0.8 mg/dL (ref 0.57–1.00)
GFR calc Af Amer: 79 mL/min/{1.73_m2} (ref >59)
GFR calc non Af Amer: 68 mL/min/{1.73_m2} (ref >59)
Glucose: 105 mg/dL — ABNORMAL HIGH (ref 65–99)
Potassium: 4.8 mmol/L (ref 3.5–5.2)
Sodium: 139 mmol/L (ref 134–144)

## 2021-01-30 LAB — CBC WITH DIFFERENTIAL/PLATELET
Basophils Absolute: 0 10*3/uL (ref 0.0–0.2)
Basos: 1 %
EOS (ABSOLUTE): 0.1 10*3/uL (ref 0.0–0.4)
Eos: 1 %
Hematocrit: 42 % (ref 34.0–46.6)
Hemoglobin: 14.1 g/dL (ref 11.1–15.9)
Immature Grans (Abs): 0 10*3/uL (ref 0.0–0.1)
Immature Granulocytes: 0 %
Lymphocytes Absolute: 0.8 10*3/uL (ref 0.7–3.1)
Lymphs: 12 %
MCH: 31.3 pg (ref 26.6–33.0)
MCHC: 33.6 g/dL (ref 31.5–35.7)
MCV: 93 fL (ref 79–97)
Monocytes Absolute: 0.5 10*3/uL (ref 0.1–0.9)
Monocytes: 8 %
Neutrophils Absolute: 5.6 10*3/uL (ref 1.4–7.0)
Neutrophils: 78 %
Platelets: 287 10*3/uL (ref 150–450)
RBC: 4.51 x10E6/uL (ref 3.77–5.28)
RDW: 12.2 % (ref 11.7–15.4)
WBC: 7.1 10*3/uL (ref 3.4–10.8)

## 2021-02-04 ENCOUNTER — Ambulatory Visit (INDEPENDENT_AMBULATORY_CARE_PROVIDER_SITE_OTHER): Payer: Medicare Other | Admitting: Internal Medicine

## 2021-02-04 ENCOUNTER — Encounter: Payer: Self-pay | Admitting: Internal Medicine

## 2021-02-04 ENCOUNTER — Other Ambulatory Visit: Payer: Self-pay

## 2021-02-04 VITALS — BP 134/64 | HR 85 | Temp 97.6°F | Resp 14 | Ht 64.0 in | Wt 161.2 lb

## 2021-02-04 DIAGNOSIS — R7303 Prediabetes: Secondary | ICD-10-CM | POA: Diagnosis not present

## 2021-02-04 DIAGNOSIS — I10 Essential (primary) hypertension: Secondary | ICD-10-CM | POA: Diagnosis not present

## 2021-02-04 DIAGNOSIS — E538 Deficiency of other specified B group vitamins: Secondary | ICD-10-CM

## 2021-02-04 DIAGNOSIS — E7800 Pure hypercholesterolemia, unspecified: Secondary | ICD-10-CM | POA: Diagnosis not present

## 2021-02-04 DIAGNOSIS — R419 Unspecified symptoms and signs involving cognitive functions and awareness: Secondary | ICD-10-CM | POA: Insufficient documentation

## 2021-02-04 DIAGNOSIS — I700 Atherosclerosis of aorta: Secondary | ICD-10-CM

## 2021-02-04 LAB — POCT GLYCOSYLATED HEMOGLOBIN (HGB A1C): Hemoglobin A1C: 6 % — AB (ref 4.0–5.6)

## 2021-02-04 NOTE — Patient Instructions (Signed)
You are doing well!   You have lowered your A1c to 6.0.  This is excellent  See you I n 3 months \\fasting labs same day or prior

## 2021-02-04 NOTE — Assessment & Plan Note (Signed)
Reviewed findings of prior CT scan today..  Patient is tolerating pravastatin  And last LDL was 70.  No changes today   . 

## 2021-02-04 NOTE — Assessment & Plan Note (Signed)
Complaints are limited to tasks and names.  MMSE is 28/30  Previous cerebral imaging reviewed.  Will check TSH and B12 at next lab draw

## 2021-02-04 NOTE — Assessment & Plan Note (Signed)
She has been lowering her A1c with careful diet.  Encouraged to exercise

## 2021-02-04 NOTE — Assessment & Plan Note (Signed)
Well controlled on current regimen of dlitiazem and metoprolol.  No adverse effects. Renal function normal, no changes today.  Lab Results  Component Value Date   CREATININE 0.80 01/29/2021   Lab Results  Component Value Date   NA 139 01/29/2021   K 4.8 01/29/2021   CL 102 01/29/2021   CO2 23 01/29/2021

## 2021-02-04 NOTE — Progress Notes (Signed)
Subjective:  Patient ID: [name] [name], female    DOB: May 25, 1936  Age: 85 y.o. MRN: [id number]  CC: The primary encounter diagnosis was Prediabetes. Diagnoses of Pure hypercholesterolemia, B12 deficiency, Cognitive complaints, Aortic atherosclerosis ([place]), and Essential hypertension were also pertinent to this visit.  HPI MARILYN NIHISER presents for follow up on prediabetes, aortic atherosclerosis ,  hyperlipidemia, GERD and atrial fibrillation  This visit occurred during the SARS-CoV-2 public health emergency.  Safety protocols were in place, including screening questions prior to the visit, additional usage of staff PPE, and extensive cleaning of exam room while observing appropriate contact time as indicated for disinfecting solutions.   Reviewed findings of prior CT scan today..  Patient is tolerating high potency statin therapy   Saw cardiology recently,  Scheduled for ECHO due to c/o dizziness  Chest pain/GERD improved .     [name] [name] Reminisced about [place]  .  discussed her anxiety:  Doesn't worry about her children  But Worried about COVID,  About her memory, getting forgetful. Father had Alzheimers but was very functional.  She spent more time with Dad than with Mom.  Husband  Keith's medical issues, new spot on lungs, has been referred to pulmonology.   Outpatient Medications Prior to Visit  Medication Sig Dispense Refill  . acetaminophen (TYLENOL) 500 MG tablet Take 1,000 mg by mouth every 6 (six) hours as needed (pain).     [name] [name] aluminum-magnesium hydroxide 200-200 MG/5ML suspension Take 20 mLs by mouth every 6 (six) hours as needed for indigestion.     . Carboxymethylcellul-Glycerin (LUBRICATING EYE DROPS OP) Place 1 drop into both eyes daily as needed (dry eyes).    . chlorhexidine (PERIDEX) 0.12 % solution Use as directed in the mouth or throat.    . cholecalciferol (VITAMIN D) 1000 units tablet Take 1,000 Units by mouth daily.    .  Coenzyme Q10 (COQ10) 200 MG CAPS Take 200 mg by mouth daily.    [name] [name] diltiazem (CARDIZEM CD) 180 MG 24 hr capsule Take 1 capsule (180 mg total) by mouth every evening. 90 capsule 1  . ELIQUIS 5 MG TABS tablet Take 1 tablet (5 mg total) by mouth 2 (two) times daily. 60 tablet 3  . metoprolol tartrate (LOPRESSOR) 50 MG tablet Take 1 tablet (50 mg total) by mouth in the morning, at noon, and at bedtime. 270 tablet 1  . pantoprazole (PROTONIX) 40 MG tablet Take 1 tablet (40 mg total) by mouth daily. 30 tablet 2  . pravastatin (PRAVACHOL) 20 MG tablet TAKE 1 TABLET DAILY 90 tablet 0  . Probiotic Product (PROBIOTIC ADVANCED PO) Take 1 tablet by mouth daily.    . sodium chloride (OCEAN) 0.65 % SOLN nasal spray Place 1 spray into both nostrils daily as needed for congestion.     No facility-administered medications prior to visit.    Review of Systems;  Patient denies headache, fevers, malaise, unintentional weight loss, skin rash, eye pain, sinus congestion and sinus pain, sore throat, dysphagia,  hemoptysis , cough, dyspnea, wheezing, chest pain, palpitations, orthopnea, edema, abdominal pain, nausea, melena, diarrhea, constipation, flank pain, dysuria, hematuria, urinary  Frequency, nocturia, numbness, tingling, seizures,  Focal weakness, Loss of consciousness,  Tremor, insomnia, depression, anxiety, and suicidal ideation.      Objective:  BP 134/64 (BP Location: Right Arm, Patient Position: Sitting, Cuff Size: Normal)   Pulse 85   Temp 97.6 F (36.4 C) (Oral)   Resp  14   Ht 5\' 4"  (1.626 m)   Wt 161 lb 3.2 oz (73.1 kg)   SpO2 98%   BMI 27.67 kg/m   BP Readings from Last 3 Encounters:  02/04/21 134/64  01/29/21 136/72  12/17/20 120/72    Wt Readings from Last 3 Encounters:  02/04/21 161 lb 3.2 oz (73.1 kg)  01/29/21 160 lb (72.6 kg)  12/30/20 162 lb (73.5 kg)    General appearance: alert, cooperative and appears stated age Ears: normal TM's and external ear canals both ears Throat:  lips, mucosa, and tongue normal; teeth and gums normal Neck: no adenopathy, no carotid bruit, supple, symmetrical, trachea midline and thyroid not enlarged, symmetric, no tenderness/mass/nodules Back: symmetric, no curvature. ROM normal. No CVA tenderness. Lungs: clear to auscultation bilaterally Heart: irreg irreg ,  no murmur, click, rub or gallop Abdomen: soft, non-tender; bowel sounds normal; no masses,  no organomegaly Pulses: 2+ and symmetric Skin: Skin color, texture, turgor normal. No rashes or lesions Lymph nodes: Cervical, supraclavicular, and axillary nodes normal. MSK:  No proximal muscle weakness (able to rise from chair ) Neuro:  awake and interactive with normal mood and affect. Higher cortical functions are normal. Speech is clear without word-finding difficulty or dysarthria. Extraocular movements are intact. Visual fields of both eyes are grossly intact. Sensation to light touch is grossly intact bilaterally of upper and lower extremities. Motor examination shows 4+/5 symmetric hand grip and upper extremity and 5/5 lower extremity strength. There is no pronation or drift. Gait is non-ataxic    Lab Results  Component Value Date   HGBA1C 6.2 05/15/2020   HGBA1C 6.4 12/20/2019   HGBA1C 5.7 04/08/2017    Lab Results  Component Value Date   CREATININE 0.80 01/29/2021   CREATININE 0.78 05/15/2020   CREATININE 0.92 04/26/2020    Lab Results  Component Value Date   WBC 7.1 01/29/2021   HGB 14.1 01/29/2021   HCT 42.0 01/29/2021   PLT 287 01/29/2021   GLUCOSE 105 (H) 01/29/2021   CHOL 139 05/15/2020   TRIG 78.0 05/15/2020   HDL 53.80 05/15/2020   LDLCALC 70 05/15/2020   ALT 18 05/15/2020   AST 19 05/15/2020   NA 139 01/29/2021   K 4.8 01/29/2021   CL 102 01/29/2021   CREATININE 0.80 01/29/2021   BUN 15 01/29/2021   CO2 23 01/29/2021   TSH 0.55 05/15/2020   INR 1.3 02/12/2019   HGBA1C 6.2 05/15/2020   MICROALBUR 8.3 05/25/2017    CT Head Wo  Contrast  Result Date: 08/31/2020 CLINICAL DATA:  85 year old who is currently anticoagulated on Eliquis who has fallen multiple times in the past 3 days, striking her head at least once. Bruising to the nose. Occipital headache. Initial encounter. EXAM: CT HEAD WITHOUT CONTRAST CT MAXILLOFACIAL WITHOUT CONTRAST TECHNIQUE: Multidetector CT imaging of the head and maxillofacial structures were performed using the standard protocol without intravenous contrast. Multiplanar CT image reconstructions of the maxillofacial structures were also generated. A metallic BB was placed on the right temple in order to reliably differentiate right from left. COMPARISON:  CT head 03/24/2020 and earlier. No prior maxillofacial CT. FINDINGS: CT HEAD FINDINGS Brain: Ventricular system normal in size and appearance for age. Mild age related cortical atrophy, unchanged dating back to at least 2015. No mass lesion. No midline shift. No acute hemorrhage or hematoma. No extra-axial fluid collections. No evidence of acute infarction. Vascular: No hyperdense vessel.  No visible atherosclerosis. Skull: No skull fracture or other focal osseous  abnormality involving the skull. Other: None. CT MAXILLOFACIAL FINDINGS Osseous: Slight inwardly displaced fracture of the RIGHT nasal bone (3/33). No other facial bone fractures. Temporomandibular joints anatomically aligned without significant degenerative changes. Orbits: No fractures. 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 Normal appearing LEFT orbit. Sinuses: Minimal to mild mucosal thickening involving the RIGHT maxillary sinus. Remaining paranasal sinuses well aerated. BILATERAL mastoid air cells and BILATERAL middle ear cavities well-aerated. Soft tissues: No evidence of hematoma involving the facial soft tissues. IMPRESSION: 1. No acute intracranial abnormality. 2. Slight inwardly displaced  fracture of the RIGHT nasal bone. No other facial bone fractures identified. 3. 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 Non-emergent MRI of the face without and with contrast is recommended in further evaluation. 4. Minimal to mild chronic RIGHT maxillary sinus disease. Electronically Signed   By: Evangeline Dakin M.D.   On: 08/31/2020 13:44   CT Maxillofacial Wo Contrast  Result Date: 08/31/2020 CLINICAL DATA:  85 year old who is currently anticoagulated on Eliquis who has fallen multiple times in the past 3 days, striking her head at least once. Bruising to the nose. Occipital headache. Initial encounter. EXAM: CT HEAD WITHOUT CONTRAST CT MAXILLOFACIAL WITHOUT CONTRAST TECHNIQUE: Multidetector CT imaging of the head and maxillofacial structures were performed using the standard protocol without intravenous contrast. Multiplanar CT image reconstructions of the maxillofacial structures were also generated. A metallic BB was placed on the right temple in order to reliably differentiate right from left. COMPARISON:  CT head 03/24/2020 and earlier. No prior maxillofacial CT. FINDINGS: CT HEAD FINDINGS Brain: Ventricular system normal in size and appearance for age. Mild age related cortical atrophy, unchanged dating back to at least 2015. No mass lesion. No midline shift. No acute hemorrhage or hematoma. No extra-axial fluid collections. No evidence of acute infarction. Vascular: No hyperdense vessel.  No visible atherosclerosis. Skull: No skull fracture or other focal osseous abnormality involving the skull. Other: None. CT MAXILLOFACIAL FINDINGS Osseous: Slight inwardly displaced fracture of the RIGHT nasal bone (3/33). No other facial bone fractures. Temporomandibular joints anatomically aligned without significant degenerative changes. Orbits: No fractures. Expansion of  the INFERIOR orbital canal on the RIGHT with soft tissue that extends into the SUPERIOR aspect of the RIGHT maxillary sinus. Normal appearing LEFT orbit. Sinuses: Minimal to mild mucosal thickening involving the RIGHT maxillary sinus. Remaining paranasal sinuses well aerated. BILATERAL mastoid air cells and BILATERAL middle ear cavities well-aerated. Soft tissues: No evidence of hematoma involving the facial soft tissues. IMPRESSION: 1. No acute intracranial abnormality. 2. Slight inwardly displaced fracture of the RIGHT nasal bone. No other facial bone fractures identified. 3. 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 Non-emergent MRI of the face without and with contrast is recommended in further evaluation. 4. Minimal to mild chronic RIGHT maxillary sinus disease. Electronically Signed   By: Evangeline Dakin M.D.   On: 08/31/2020 13:44    Assessment & Plan:   Problem List Items Addressed This Visit      Unprioritized   Aortic atherosclerosis ([place])    Reviewed findings of prior CT scan today..  Patient is tolerating pravastatin  And last LDL was  70.  No changes today       Essential hypertension    Well controlled on current regimen of dlitiazem and metoprolol.  No adverse effects. Renal function normal, no changes today.  Lab Results  Component Value Date   CREATININE 0.80 01/29/2021   Lab Results  Component Value Date   NA 139 01/29/2021   K 4.8 01/29/2021   CL 102 01/29/2021   CO2 23 01/29/2021         Hyperlipidemia   Relevant Orders   Lipid panel   Comprehensive metabolic panel   Prediabetes - Primary    She has been lowering her A1c with careful diet.  Encouraged to exercise       Relevant Orders   POCT HgB A1C    Other Visit Diagnoses    B12 deficiency       Relevant Orders   Vitamin B12   Cognitive complaints       Relevant  Orders   TSH      I am having Lashala A. Kibble maintain her acetaminophen, aluminum-magnesium hydroxide, Probiotic Product (PROBIOTIC ADVANCED PO), cholecalciferol, CoQ10, Carboxymethylcellul-Glycerin (LUBRICATING EYE DROPS OP), sodium chloride, pantoprazole, metoprolol tartrate, pravastatin, diltiazem, chlorhexidine, and Eliquis.  No orders of the defined types were placed in this encounter.   There are no discontinued medications.  Follow-up: Return in about 3 months (around 05/04/2021).   [name], MD

## 2021-02-09 ENCOUNTER — Ambulatory Visit (INDEPENDENT_AMBULATORY_CARE_PROVIDER_SITE_OTHER): Payer: Medicare Other

## 2021-02-09 ENCOUNTER — Other Ambulatory Visit: Payer: Self-pay

## 2021-02-09 DIAGNOSIS — R079 Chest pain, unspecified: Secondary | ICD-10-CM

## 2021-02-09 DIAGNOSIS — I4821 Permanent atrial fibrillation: Secondary | ICD-10-CM

## 2021-02-10 LAB — ECHOCARDIOGRAM COMPLETE
AV Vena cont: 0.3 cm
Area-P 1/2: 2.6 cm2
Calc EF: 50.3 %
S' Lateral: 3 cm
Single Plane A2C EF: 43.8 %
Single Plane A4C EF: 58.3 %

## 2021-02-16 ENCOUNTER — Telehealth: Payer: Self-pay | Admitting: Cardiovascular Disease

## 2021-02-16 ENCOUNTER — Other Ambulatory Visit: Payer: Self-pay | Admitting: Cardiovascular Disease

## 2021-02-16 MED ORDER — PANTOPRAZOLE SODIUM 40 MG PO TBEC
40.0000 mg | DELAYED_RELEASE_TABLET | Freq: Every day | ORAL | 6 refills | Status: DC
Start: 2021-02-16 — End: 2021-12-22

## 2021-02-16 NOTE — Telephone Encounter (Signed)
Patient would like echocardiogram results.  

## 2021-02-16 NOTE — Telephone Encounter (Signed)
Received fax from [employer] requesting refills for Pantoprazole. Rx request sent to pharmacy.

## 2021-02-16 NOTE — Telephone Encounter (Signed)
The patient had her echocardiogram on 02/09/21 and is awaiting Dr. Fletcher Anon to review (in MD inbasket).  Message sent to MD to notify him the patient is calling for final results.    I have also called the patient and notified her we are waiting for Dr. Fletcher Anon to sign off on the final report and will call her back once this has been completed.  The patient states her primary number didn't seem to be working today. She would like [place] to try that number 1st when calling back [phone]) & if that does not work, then call ([phone]).

## 2021-02-17 NOTE — Telephone Encounter (Signed)
-----   Message from Wellington Hampshire, MD sent at 02/16/2021  6:03 PM EST ----- Inform patient that echo was fine.  Normal LV systolic function with mild pulmonary hypertension.  No significant change from most recent echocardiogram.

## 2021-02-17 NOTE — Telephone Encounter (Signed)
Patient made aware of echo results with verbalized understanding. 

## 2021-02-20 ENCOUNTER — Other Ambulatory Visit: Payer: Self-pay | Admitting: Internal Medicine

## 2021-03-27 ENCOUNTER — Encounter: Payer: Self-pay | Admitting: *Deleted

## 2021-04-06 ENCOUNTER — Encounter: Payer: Self-pay | Admitting: Internal Medicine

## 2021-04-06 ENCOUNTER — Ambulatory Visit (INDEPENDENT_AMBULATORY_CARE_PROVIDER_SITE_OTHER): Payer: Medicare Other | Admitting: Internal Medicine

## 2021-04-06 VITALS — BP 128/70 | HR 105 | Ht 63.0 in | Wt 160.2 lb

## 2021-04-06 DIAGNOSIS — R142 Eructation: Secondary | ICD-10-CM

## 2021-04-06 DIAGNOSIS — K219 Gastro-esophageal reflux disease without esophagitis: Secondary | ICD-10-CM

## 2021-04-06 DIAGNOSIS — R150 Incomplete defecation: Secondary | ICD-10-CM

## 2021-04-06 MED ORDER — FAMOTIDINE 20 MG PO TABS: 20.0000 mg | ORAL_TABLET | Freq: Every day | ORAL | 3 refills | Status: DC

## 2021-04-06 NOTE — Progress Notes (Signed)
Patient ID: [name] [name], female   DOB: 06-Aug-1936, 85 y.o.   MRN: [id number] HPI: [name] [name] is an 85 year old female with a history of GERD, hypertension, hyperlipidemia, atrial fibrillation, sleep apnea, CKD who is seen to establish care and to discuss belching and reflux.  She is here today with her husband.  Her GI history was in [place] previously.  Her last upper endoscopy and colonoscopy were performed in 2015. EGD performed on 09/16/2014 was normal Colonoscopy performed on 04/13/2014 was normal --indication for this exam was heme positive stool.  I do see that she has had approximately 3 colonoscopies with no report of adenomatous polyps.  She reports that for the most part she does well but she has issues with indigestion particularly at night.  For her this is belching and burping.  There is some mild pyrosis that can happen with her belching.  She does not have nausea or vomiting.  No dysphagia or odynophagia.  Appetite is good.  She has issues with the belching on an almost nightly basis.  She takes pantoprazole 40 mg once daily.  Her bowel movements are regular though at times feel incomplete.  She does not see blood in her stool or melena.  She tries to have a bowel movement every day.  Past Medical History:  Diagnosis Date  . Arthritis   . Atrial flutter ([place]) 02/2011   s/p cardioversion   . Chest pain    a. H/o cardiac cath x 2-> 2012 -->nl cors;  b. 12/2016 MV: EF 61%, small region of mild perfusion defect in the apical anteroseptal region c/w breast attenuation, no ischemia-->Low risk; c. 06/2019 MV: Mod size, mild inflat ischemia, EF 59%. Cor and Ao Ca2+. Inflat defect more pronounced on this study compared to last; c. 07/2019 Cath: LM nl, LAD min irregs, LCX nl, OM1/2/3 nl, RCA min irregs.  . CKD (chronic kidney disease), stage III ([place])   . Concussion with no loss of consciousness 09/01/2016  . Cystocele   . Degenerative disorder of bone   . GERD (gastroesophageal reflux  disease)   . Headache(784.0)    chronic  . Hiatal hernia   . Hyperlipidemia   . Hypertension   . Influenza 01/10/2017  . Knee fracture   . Migraines   . Mobitz type 2 second degree atrioventricular block    a. felt to be 2/2 amiodarone, resolved with decreased amiodarone dose.  Amio since d/c'd.  . Permanent atrial fibrillation ([place])    a. status post multiple DCCVs; b. 2018 - eval for PVI but opted for rate control.  [name] PONV (postoperative nausea and vomiting)    oxycodone and codiene cause N/V   . Pre-syncope    a. In setting of dehydration and AKI in the past.  . Sleep apnea   . Squamous cell carcinoma of skin 11/10/2020   left distal posterior deltoid ([facility] 01/15/2021)  . Tachycardia induced cardiomyopathy ([place])    a. Resolved;  b. 08/2017 Echo: EF 50-55%, no rwma, mild MR, mildly to mod dil LA/RA; c. 02/2018 Echo: EF 55-60%, mild MR. Mildly dil LA. Nl RVSP. PASP 37mmHg.  [name] Venous insufficiency   . Vertigo     Past Surgical History:  Procedure Laterality Date  . ABDOMINAL HYSTERECTOMY  1990  . APPENDECTOMY    . AUGMENTATION MAMMAPLASTY Bilateral 1986   implants  . AUGMENTATION MAMMAPLASTY  1990  . AUGMENTATION MAMMAPLASTY  2011  . CARDIAC CATHETERIZATION    . CARDIOVERSION  x 3  . CARDIOVERSION    . CARDIOVERSION N/A 02/07/2017   Procedure: CARDIOVERSION;  Surgeon: Wellington Hampshire, MD;  Location: [facility];  Service: Cardiovascular;  Laterality: N/A;  . CARDIOVERSION N/A 07/22/2017   Procedure: Cardioversion;  Surgeon: Minna Merritts, MD;  Location: [facility];  Service: Cardiovascular;  Laterality: N/A;  . CATARACT EXTRACTION W/PHACO Right 09/21/2016   Procedure: CATARACT EXTRACTION PHACO AND INTRAOCULAR LENS PLACEMENT ([place]);  Surgeon: Birder Robson, MD;  Location: [facility];  Service: Ophthalmology;  Laterality: Right;  [place] 44.1 AP% 16.5 CDE 7.30 Fluid Pack Lot #[id number]  . CATARACT EXTRACTION W/PHACO Left 10/19/2016   Procedure: CATARACT EXTRACTION PHACO AND  INTRAOCULAR LENS PLACEMENT (IOC);  Surgeon: Birder Robson, MD;  Location: [facility];  Service: Ophthalmology;  Laterality: Left;  [place] 53.7 AP% 19.5 CDE 10.45 Fluid pack lot # [id number]  . CHOLECYSTECTOMY    . COMBINED AUGMENTATION MAMMAPLASTY AND ABDOMINOPLASTY    . JOINT REPLACEMENT Left 06/04/2013   left knee  . KNEE ARTHROSCOPY Right 08/16/2016   Procedure: ARTHROSCOPY KNEE, tear posterior horn medial meniscus, tear anterior and posterior horns of lateral meniscus, chondromalacia of lateral compartment grade 3 patella and grade 4 medial;  Surgeon: Dereck Leep, MD;  Location: [facility];  Service: Orthopedics;  Laterality: Right;  . LEFT HEART CATH AND CORONARY ANGIOGRAPHY Left 07/27/2019   Procedure: LEFT HEART CATH AND CORONARY ANGIOGRAPHY;  Surgeon: Wellington Hampshire, MD;  Location: [facility];  Service: Cardiovascular;  Laterality: Left;  [name] MASTECTOMY  1986   nipple sparing mastectomy/Bilateral with silicone  breast implants, s/p saline replacements  . Multiple orthopedic procedures    . NOSE SURGERY    . TEE WITHOUT CARDIOVERSION N/A 09/26/2017   Procedure: TRANSESOPHAGEAL ECHOCARDIOGRAM (TEE);  Surgeon: Fay Records, MD;  Location: [facility];  Service: Cardiovascular;  Laterality: N/A;  . TOTAL KNEE ARTHROPLASTY Left     Outpatient Medications Prior to Visit  Medication Sig Dispense Refill  . acetaminophen (TYLENOL) 500 MG tablet Take 1,000 mg by mouth every 6 (six) hours as needed (pain).     [name] aluminum-magnesium hydroxide 200-200 MG/5ML suspension Take 20 mLs by mouth every 6 (six) hours as needed for indigestion.     . Carboxymethylcellul-Glycerin (LUBRICATING EYE DROPS OP) Place 1 drop into both eyes daily as needed (dry eyes).    . chlorhexidine (PERIDEX) 0.12 % solution Use as directed in the mouth or throat.    . cholecalciferol (VITAMIN D) 1000 units tablet Take 1,000 Units by mouth daily.    . Coenzyme Q10 (COQ10) 200 MG CAPS Take 200 mg by mouth daily.    [name]  diltiazem (CARDIZEM CD) 180 MG 24 hr capsule Take 1 capsule (180 mg total) by mouth every evening. 90 capsule 1  . ELIQUIS 5 MG TABS tablet Take 1 tablet (5 mg total) by mouth 2 (two) times daily. 60 tablet 3  . metoprolol tartrate (LOPRESSOR) 50 MG tablet Take 1 tablet (50 mg total) by mouth in the morning, at noon, and at bedtime. 270 tablet 1  . pantoprazole (PROTONIX) 40 MG tablet Take 1 tablet (40 mg total) by mouth daily. 30 tablet 6  . pravastatin (PRAVACHOL) 20 MG tablet TAKE 1 TABLET DAILY 90 tablet 0  . Probiotic Product (PROBIOTIC ADVANCED PO) Take 1 tablet by mouth daily.    . sodium chloride (OCEAN) 0.65 % SOLN nasal spray Place 1 spray into both nostrils daily as needed for congestion.     No  facility-administered medications prior to visit.    Allergies  Allergen Reactions  . Iodine Anaphylaxis    NO PROBLEMS WITH BETADINE  . Amiodarone     Cannot remember   . Biaxin [Clarithromycin] Nausea And Vomiting  . Codeine Nausea And Vomiting  . Digoxin And Related Other (See Comments)    Fatigue, eye puffiness, hoarsness  . Enalapril Other (See Comments)    unknown  . Fluocinonide Other (See Comments)    Tingling sensation in head and redness to scalp.  . Iodinated Diagnostic Agents Other (See Comments)    Tachycardia  . Losartan Potassium Other (See Comments)    Dizzy and headache  . Oxycodone Nausea And Vomiting  . Promethazine Other (See Comments)    Unknown   . Tizanidine Hcl Other (See Comments)    hypotension   . Warfarin And Related     Bleeding   . Xarelto [Rivaroxaban] Other (See Comments)    bleeding    Family History  Problem Relation Age of Onset  . Heart disease Mother   . Stomach cancer Father   . Heart disease Son        found at autopsy  . Breast cancer Sister 53  . Breast cancer Maternal Grandmother   . Breast cancer Sister 49  . Diabetes Brother   . Esophageal cancer Brother   . Kidney failure Brother   . Breast cancer Sister 62  .  Ovarian cancer Neg Hx     Social History   Tobacco Use  . Smoking status: Never Smoker  . Smokeless tobacco: Never Used  Vaping Use  . Vaping Use: Never used  Substance Use Topics  . Alcohol use: No  . Drug use: No    ROS: As per history of present illness, otherwise negative  BP 128/70   Pulse (!) 105   Ht 5\' 3"  (1.6 m)   Wt 160 lb 3.2 oz (72.7 kg)   SpO2 95%   BMI 28.38 kg/m  Gen: awake, alert, NAD HEENT: anicteric, op clear CV: RRR, no mrg Pulm: CTA b/l Abd: soft, NT/ND, +BS throughout Ext: no c/c/e Neuro: nonfocal   RELEVANT LABS AND IMAGING: CBC    Component Value Date/Time   WBC 7.1 01/29/2021 0845   WBC 7.0 05/15/2020 1441   RBC 4.51 01/29/2021 0845   RBC 4.27 05/15/2020 1441   HGB 14.1 01/29/2021 0845   HCT 42.0 01/29/2021 0845   PLT 287 01/29/2021 0845   MCV 93 01/29/2021 0845   MCV 99 01/11/2015 1623   MCH 31.3 01/29/2021 0845   MCH 31.7 04/26/2020 0744   MCHC 33.6 01/29/2021 0845   MCHC 33.0 05/15/2020 1441   RDW 12.2 01/29/2021 0845   RDW 14.3 01/11/2015 1623   LYMPHSABS 0.8 01/29/2021 0845   LYMPHSABS 0.9 (L) 01/11/2015 1623   MONOABS 0.4 05/15/2020 1441   MONOABS 0.7 01/11/2015 1623   EOSABS 0.1 01/29/2021 0845   EOSABS 0.0 01/11/2015 1623   BASOSABS 0.0 01/29/2021 0845   BASOSABS 0.1 01/11/2015 1623    CMP     Component Value Date/Time   NA 139 01/29/2021 0845   NA 136 01/11/2015 1623   K 4.8 01/29/2021 0845   K 4.8 01/11/2015 1623   CL 102 01/29/2021 0845   CL 103 01/11/2015 1623   CO2 23 01/29/2021 0845   CO2 26 01/11/2015 1623   GLUCOSE 105 (H) 01/29/2021 0845   GLUCOSE 104 (H) 05/15/2020 1441   GLUCOSE 194 (H) 01/11/2015 1623  BUN 15 01/29/2021 0845   BUN 24 (H) 01/11/2015 1623   CREATININE 0.80 01/29/2021 0845   CREATININE 1.06 (H) 07/24/2015 1606   CALCIUM 9.6 01/29/2021 0845   CALCIUM 8.4 (L) 01/11/2015 1623   PROT 6.7 05/15/2020 1441   PROT 7.4 07/21/2017 1043   PROT 6.5 01/11/2015 1623   ALBUMIN 4.3  05/15/2020 1441   ALBUMIN 4.7 07/21/2017 1043   ALBUMIN 3.2 (L) 01/11/2015 1623   AST 19 05/15/2020 1441   AST 20 01/11/2015 1623   ALT 18 05/15/2020 1441   ALT 28 01/11/2015 1623   ALKPHOS 68 05/15/2020 1441   ALKPHOS 49 01/11/2015 1623   BILITOT 1.0 05/15/2020 1441   BILITOT 0.7 07/21/2017 1043   BILITOT 0.4 01/11/2015 1623   GFRNONAA 68 01/29/2021 0845   GFRNONAA 51 (L) 07/24/2015 1606   GFRAA 79 01/29/2021 0845   GFRAA 59 (L) 07/24/2015 1606    ASSESSMENT/PLAN: 85 year old female with a history of GERD, hypertension, hyperlipidemia, atrial fibrillation, sleep apnea, CKD who is seen to establish care and to discuss belching and reflux.   1. GERD/belching symptom --she is having predominantly nocturnal breakthrough of her heartburn as well as belching symptoms.  She had a normal endoscopy about 7 years ago.  I recommended the following: -- Continue pantoprazole 40 mg daily, 30 minutes before the first meal of the day -- Add famotidine 20 mg in the late evening nightly; she is asked to notify me if this fails to improve her symptoms  2.  Mild constipation/incomplete defecation --add Benefiber 1 heaping tablespoon daily.  This can be increased to 2 tablespoons daily if well-tolerated and helpful  3.  CRC screening --no history of adenomatous polyps and a normal colonoscopy 7 years ago.  She will likely not require or benefit from future screening colonoscopy based on age.  This does not mean that we would not investigate lower GI symptoms should they occur.      UQ:JFHLK, Aris Everts, Md [address] [place],  [place] [address]

## 2021-04-06 NOTE — Patient Instructions (Addendum)
Continue pantoprazole 40 mg daily.  We have sent the following medications to your pharmacy for you to pick up at your convenience: pepcid 20 mg every night  Please purchase the following medications over the counter and take as directed: Benefiber 1 heaping tablespoon daily  Please follow up with Dr Hilarie Fredrickson on 07/13/21 at 9:10 am.  If you are age 83 or older, your body mass index should be between 23-30. Your Body mass index is 28.38 kg/m. If this is out of the aforementioned range listed, please consider follow up with your Primary Care Provider.  If you are age 63 or younger, your body mass index should be between 19-25. Your Body mass index is 28.38 kg/m. If this is out of the aformentioned range listed, please consider follow up with your Primary Care Provider.   Due to recent changes in healthcare laws, you may see the results of your imaging and laboratory studies on MyChart before your provider has had a chance to review them.  We understand that in some cases there may be results that are confusing or concerning to you. Not all laboratory results come back in the same time frame and the provider may be waiting for multiple results in order to interpret others.  Please give [place] 48 hours in order for your provider to thoroughly review all the results before contacting the office for clarification of your results.

## 2021-04-08 ENCOUNTER — Telehealth: Payer: Self-pay | Admitting: Internal Medicine

## 2021-04-08 NOTE — Telephone Encounter (Signed)
Left message for patient to call back and schedule Medicare Annual Wellness Visit (AWV)   Please schedule on the [facility] template  Last AWV 06/19/58; please schedule at anytime   This should be a 45 minute visit

## 2021-04-10 ENCOUNTER — Telehealth: Payer: Medicare Other | Admitting: Internal Medicine

## 2021-04-10 DIAGNOSIS — R350 Frequency of micturition: Secondary | ICD-10-CM | POA: Diagnosis not present

## 2021-04-27 ENCOUNTER — Other Ambulatory Visit: Payer: Self-pay

## 2021-04-29 ENCOUNTER — Telehealth: Payer: Self-pay

## 2021-04-29 ENCOUNTER — Telehealth: Payer: Medicare Other | Admitting: Adult Health

## 2021-04-29 NOTE — Telephone Encounter (Signed)
Called and spoke to [place]. Lauraine states that she is doing better since her fall and has already been to [facility] to be evaluated. She was told that she does not have a concussion and was cleared to go home. Charlen reports that her headaches have gone away and that she does not need todays appointment. She only asks for an appointment to have blood drawn as she wants her cholesterol to be checked. I reiterated that she does not need todays appointment and she confirmed that it was not needed and she wished to cancel it and only wanted to be seen for labs instead. I informed the patient that she has a lab fasting lab appointment on 05/04/2021 and a F/u appointment with Dr. Derrel Nip on 05/06/2021. Eleasha verbalized understanding and had no further questions.

## 2021-04-30 NOTE — Progress Notes (Signed)
Patient spoke with [name] [name] CMA and declined provider appointment she was already seen at [facility] urgent care- he advised her to call back to be seen if any symptoms or concerns at anytime and seek after hours care/ emergency room if needed at anytime.   No provider contact.

## 2021-05-04 ENCOUNTER — Other Ambulatory Visit: Payer: Self-pay

## 2021-05-04 ENCOUNTER — Other Ambulatory Visit (INDEPENDENT_AMBULATORY_CARE_PROVIDER_SITE_OTHER): Payer: Medicare Other

## 2021-05-04 DIAGNOSIS — E538 Deficiency of other specified B group vitamins: Secondary | ICD-10-CM | POA: Diagnosis not present

## 2021-05-04 DIAGNOSIS — E7800 Pure hypercholesterolemia, unspecified: Secondary | ICD-10-CM

## 2021-05-04 DIAGNOSIS — R419 Unspecified symptoms and signs involving cognitive functions and awareness: Secondary | ICD-10-CM

## 2021-05-04 LAB — COMPREHENSIVE METABOLIC PANEL
ALT: 18 U/L (ref 0–35)
AST: 18 U/L (ref 0–37)
Albumin: 4.2 g/dL (ref 3.5–5.2)
Alkaline Phosphatase: 67 U/L (ref 39–117)
BUN: 16 mg/dL (ref 6–23)
CO2: 27 mEq/L (ref 19–32)
Calcium: 9.4 mg/dL (ref 8.4–10.5)
Chloride: 102 mEq/L (ref 96–112)
Creatinine, Ser: 0.79 mg/dL (ref 0.40–1.20)
GFR: 69.24 mL/min (ref >60.00)
Glucose, Bld: 114 mg/dL — ABNORMAL HIGH (ref 70–99)
Potassium: 4.8 mEq/L (ref 3.5–5.1)
Sodium: 136 mEq/L (ref 135–145)
Total Bilirubin: 1 mg/dL (ref 0.2–1.2)
Total Protein: 6.8 g/dL (ref 6.0–8.3)

## 2021-05-04 LAB — LIPID PANEL
Cholesterol: 138 mg/dL (ref 0–200)
HDL: 60.2 mg/dL (ref >39.00)
LDL Cholesterol: 64 mg/dL (ref 0–99)
NonHDL: 77.65
Total CHOL/HDL Ratio: 2
Triglycerides: 68 mg/dL (ref 0.0–149.0)
VLDL: 13.6 mg/dL (ref 0.0–40.0)

## 2021-05-04 LAB — VITAMIN B12: Vitamin B-12: 328 pg/mL (ref 211–911)

## 2021-05-04 LAB — TSH: TSH: 0.61 u[IU]/mL (ref 0.35–4.50)

## 2021-05-06 ENCOUNTER — Encounter: Payer: Self-pay | Admitting: Internal Medicine

## 2021-05-06 ENCOUNTER — Ambulatory Visit (INDEPENDENT_AMBULATORY_CARE_PROVIDER_SITE_OTHER): Payer: Medicare Other | Admitting: Internal Medicine

## 2021-05-06 ENCOUNTER — Other Ambulatory Visit: Payer: Self-pay | Admitting: Internal Medicine

## 2021-05-06 ENCOUNTER — Other Ambulatory Visit: Payer: Self-pay

## 2021-05-06 VITALS — BP 126/68 | HR 90 | Temp 97.6°F | Ht 63.0 in | Wt 160.4 lb

## 2021-05-06 DIAGNOSIS — I700 Atherosclerosis of aorta: Secondary | ICD-10-CM | POA: Diagnosis not present

## 2021-05-06 DIAGNOSIS — Z803 Family history of malignant neoplasm of breast: Secondary | ICD-10-CM

## 2021-05-06 DIAGNOSIS — R350 Frequency of micturition: Secondary | ICD-10-CM | POA: Diagnosis not present

## 2021-05-06 DIAGNOSIS — R7303 Prediabetes: Secondary | ICD-10-CM

## 2021-05-06 MED ORDER — ZOSTER VAC RECOMB ADJUVANTED 50 MCG/0.5ML IM SUSR: 0.5000 mL | Freq: Once | INTRAMUSCULAR | 1 refills | Status: AC

## 2021-05-06 MED ORDER — PRAVASTATIN SODIUM 20 MG PO TABS
20.0000 mg | ORAL_TABLET | Freq: Every day | ORAL | 2 refills | Status: DC
Start: 2021-05-06 — End: 2022-02-11

## 2021-05-06 NOTE — Progress Notes (Signed)
Subjective:  Patient ID: [name] [name], female    DOB: [birth year]  Age: 85 y.o. MRN: [id number]  CC: The primary encounter diagnosis was Frequency of urination. Diagnoses of Aortic atherosclerosis ([place]), Family history of breast cancer in sister, and Prediabetes were also pertinent to this visit.  HPI LIEL RUDDEN presents for FOLLOW up on chronic issues  This visit occurred during the SARS-CoV-2 public health emergency.  Safety protocols were in place, including screening questions prior to the visit, additional usage of staff PPE, and extensive cleaning of exam room while observing appropriate contact time as indicated for disinfecting solutions.   Treated with cefdinir by [facility] on April 22 for urinary frequency without dysuria   UA and culture were ultimately  NEGATIVE   She can't remember if she took the antibiotic or not.  Does not drink caffeine.  Has urinary urgency with occasional episodes of incontinence (urge) when working outside. Does not want medications.      Outpatient Medications Prior to Visit  Medication Sig Dispense Refill  . acetaminophen (TYLENOL) 500 MG tablet Take 1,000 mg by mouth every 6 (six) hours as needed (pain).     [name] aluminum-magnesium hydroxide 200-200 MG/5ML suspension Take 20 mLs by mouth every 6 (six) hours as needed for indigestion.     . Carboxymethylcellul-Glycerin (LUBRICATING EYE DROPS OP) Place 1 drop into both eyes daily as needed (dry eyes).    . chlorhexidine (PERIDEX) 0.12 % solution Use as directed in the mouth or throat.    . cholecalciferol (VITAMIN D) 1000 units tablet Take 1,000 Units by mouth daily.    . Coenzyme Q10 (COQ10) 200 MG CAPS Take 200 mg by mouth daily.    [name] diltiazem (CARDIZEM CD) 180 MG 24 hr capsule Take 1 capsule (180 mg total) by mouth every evening. 90 capsule 1  . ELIQUIS 5 MG TABS tablet Take 1 tablet (5 mg total) by mouth 2 (two) times daily. 60 tablet 3  . famotidine (PEPCID) 20 MG tablet Take 1  tablet (20 mg total) by mouth at bedtime. 30 tablet 3  . metoprolol tartrate (LOPRESSOR) 50 MG tablet Take 1 tablet (50 mg total) by mouth in the morning, at noon, and at bedtime. 270 tablet 1  . pantoprazole (PROTONIX) 40 MG tablet Take 1 tablet (40 mg total) by mouth daily. 30 tablet 6  . Probiotic Product (PROBIOTIC ADVANCED PO) Take 1 tablet by mouth daily.    . sodium chloride (OCEAN) 0.65 % SOLN nasal spray Place 1 spray into both nostrils daily as needed for congestion.    . pravastatin (PRAVACHOL) 20 MG tablet TAKE 1 TABLET DAILY 90 tablet 0   No facility-administered medications prior to visit.    Review of Systems;  Patient denies headache, fevers, malaise, unintentional weight loss, skin rash, eye pain, sinus congestion and sinus pain, sore throat, dysphagia,  hemoptysis , cough, dyspnea, wheezing, chest pain, palpitations, orthopnea, edema, abdominal pain, nausea, melena, diarrhea, constipation, flank pain, dysuria, hematuria, urinary  Frequency, nocturia, numbness, tingling, seizures,  Focal weakness, Loss of consciousness,  Tremor, insomnia, depression, anxiety, and suicidal ideation.      Objective:  BP 126/68   Pulse 90   Temp 97.6 F (36.4 C) (Oral)   Ht 5\' 3"  (1.6 m)   Wt 160 lb 6.4 oz (72.8 kg)   SpO2 96%   BMI 28.41 kg/m   BP Readings from Last 3 Encounters:  05/06/21 126/68  04/06/21 128/70  02/04/21 134/64  Wt Readings from Last 3 Encounters:  05/06/21 160 lb 6.4 oz (72.8 kg)  04/06/21 160 lb 3.2 oz (72.7 kg)  02/04/21 161 lb 3.2 oz (73.1 kg)    General appearance: alert, cooperative and appears stated age Ears: normal TM's and external ear canals both ears Throat: lips, mucosa, and tongue normal; teeth and gums normal Neck: no adenopathy, no carotid bruit, supple, symmetrical, trachea midline and thyroid not enlarged, symmetric, no tenderness/mass/nodules Back: symmetric, no curvature. ROM normal. No CVA tenderness. Lungs: clear to auscultation  bilaterally Heart: regular rate and rhythm, S1, S2 normal, no murmur, click, rub or gallop Abdomen: soft, non-tender; bowel sounds normal; no masses,  no organomegaly Pulses: 2+ and symmetric Skin: Skin color, texture, turgor normal. No rashes or lesions Lymph nodes: Cervical, supraclavicular, and axillary nodes normal.  Lab Results  Component Value Date   HGBA1C 6.0 (A) 02/04/2021   HGBA1C 6.2 05/15/2020   HGBA1C 6.4 12/20/2019    Lab Results  Component Value Date   CREATININE 0.79 05/04/2021   CREATININE 0.80 01/29/2021   CREATININE 0.78 05/15/2020    Lab Results  Component Value Date   WBC 7.1 01/29/2021   HGB 14.1 01/29/2021   HCT 42.0 01/29/2021   PLT 287 01/29/2021   GLUCOSE 114 (H) 05/04/2021   CHOL 138 05/04/2021   TRIG 68.0 05/04/2021   HDL 60.20 05/04/2021   LDLCALC 64 05/04/2021   ALT 18 05/04/2021   AST 18 05/04/2021   NA 136 05/04/2021   K 4.8 05/04/2021   CL 102 05/04/2021   CREATININE 0.79 05/04/2021   BUN 16 05/04/2021   CO2 27 05/04/2021   TSH 0.61 05/04/2021   INR 1.3 02/12/2019   HGBA1C 6.0 (A) 02/04/2021   MICROALBUR 8.3 05/25/2017    CT Head Wo Contrast  Result Date: 08/31/2020 CLINICAL DATA:  85 year old who is currently anticoagulated on Eliquis who has fallen multiple times in the past 3 days, striking her head at least once. Bruising to the nose. Occipital headache. Initial encounter. EXAM: CT HEAD WITHOUT CONTRAST CT MAXILLOFACIAL WITHOUT CONTRAST TECHNIQUE: Multidetector CT imaging of the head and maxillofacial structures were performed using the standard protocol without intravenous contrast. Multiplanar CT image reconstructions of the maxillofacial structures were also generated. A metallic BB was placed on the right temple in order to reliably differentiate right from left. COMPARISON:  CT head 03/24/2020 and earlier. No prior maxillofacial CT. FINDINGS: CT HEAD FINDINGS Brain: Ventricular system normal in size and appearance for age. Mild  age related cortical atrophy, unchanged dating back to at least 2015. No mass lesion. No midline shift. No acute hemorrhage or hematoma. No extra-axial fluid collections. No evidence of acute infarction. Vascular: No hyperdense vessel.  No visible atherosclerosis. Skull: No skull fracture or other focal osseous abnormality involving the skull. Other: None. CT MAXILLOFACIAL FINDINGS Osseous: Slight inwardly displaced fracture of the RIGHT nasal bone (3/33). No other facial bone fractures. Temporomandibular joints anatomically aligned without significant degenerative changes. Orbits: No fractures. 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 Normal appearing LEFT orbit. Sinuses: Minimal to mild mucosal thickening involving the RIGHT maxillary sinus. Remaining paranasal sinuses well aerated. BILATERAL mastoid air cells and BILATERAL middle ear cavities well-aerated. Soft tissues: No evidence of hematoma involving the facial soft tissues. IMPRESSION: 1. No acute intracranial abnormality. 2. Slight inwardly displaced fracture of the RIGHT nasal bone. No other facial bone fractures identified. 3.  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 Non-emergent MRI of the face without and with contrast is recommended in further evaluation. 4. Minimal to mild chronic RIGHT maxillary sinus disease. Electronically Signed   By: Evangeline Dakin M.D.   On: 08/31/2020 13:44   CT Maxillofacial Wo Contrast  Result Date: 08/31/2020 CLINICAL DATA:  85 year old who is currently anticoagulated on Eliquis who has fallen multiple times in the past 3 days, striking her head at least once. Bruising to the nose. Occipital headache. Initial encounter. EXAM: CT HEAD WITHOUT CONTRAST CT MAXILLOFACIAL WITHOUT CONTRAST  TECHNIQUE: Multidetector CT imaging of the head and maxillofacial structures were performed using the standard protocol without intravenous contrast. Multiplanar CT image reconstructions of the maxillofacial structures were also generated. A metallic BB was placed on the right temple in order to reliably differentiate right from left. COMPARISON:  CT head 03/24/2020 and earlier. No prior maxillofacial CT. FINDINGS: CT HEAD FINDINGS Brain: Ventricular system normal in size and appearance for age. Mild age related cortical atrophy, unchanged dating back to at least 2015. No mass lesion. No midline shift. No acute hemorrhage or hematoma. No extra-axial fluid collections. No evidence of acute infarction. Vascular: No hyperdense vessel.  No visible atherosclerosis. Skull: No skull fracture or other focal osseous abnormality involving the skull. Other: None. CT MAXILLOFACIAL FINDINGS Osseous: Slight inwardly displaced fracture of the RIGHT nasal bone (3/33). No other facial bone fractures. Temporomandibular joints anatomically aligned without significant degenerative changes. Orbits: No fractures. 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 Normal appearing LEFT orbit. Sinuses: Minimal to mild mucosal thickening involving the RIGHT maxillary sinus. Remaining paranasal sinuses well aerated. BILATERAL mastoid air cells and BILATERAL middle ear cavities well-aerated. Soft tissues: No evidence of hematoma involving the facial soft tissues. IMPRESSION: 1. No acute intracranial abnormality. 2. Slight inwardly displaced fracture of the RIGHT nasal bone. No other facial bone fractures identified. 3. 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 Non-emergent MRI of the face without and with contrast is  recommended in further evaluation. 4. Minimal to mild chronic RIGHT maxillary sinus disease. Electronically Signed   By: Evangeline Dakin M.D.   On: 08/31/2020 13:44    Assessment & Plan:   Problem List Items Addressed This Visit      Unprioritized   Aortic atherosclerosis ([place])    Reviewed findings of prior CT scan today..  Patient is tolerating pravastatin  And last LDL was 70.  No changes today       Relevant Medications   pravastatin (PRAVACHOL) 20 MG tablet   Family history of breast cancer in sister   Prediabetes    She has been lowering her A1c with careful diet.  Encouraged to exercise regularly to improve insulin sensitivity  Lab Results  Component Value Date   HGBA1C 6.0 (A) 02/04/2021          Other Visit Diagnoses    Frequency of urination    -  Primary   Relevant Orders   Urinalysis, Routine w reflex microscopic   Urine Culture      I provided  30 minutes of  face-to-face time during this encounter reviewing  patient's current problems and past surgeries,  Recent labs and imaging studies, providing counseling on the above mentioned problems , and coordination  of care .  I have changed Pamala Hurry A. Tokarski's pravastatin. I am also having her start on Zoster Vaccine Adjuvanted. Additionally, I am having her maintain her acetaminophen, aluminum-magnesium hydroxide, Probiotic Product (PROBIOTIC ADVANCED PO), cholecalciferol, CoQ10, Carboxymethylcellul-Glycerin (LUBRICATING EYE DROPS OP), sodium chloride, metoprolol tartrate, diltiazem, chlorhexidine, Eliquis, pantoprazole, and famotidine.  Meds ordered this encounter  Medications  . pravastatin (PRAVACHOL) 20 MG tablet    Sig: Take 1 tablet (20 mg total) by mouth daily.    Dispense:  90 tablet    Refill:  2  . Zoster Vaccine Adjuvanted [facility]) injection    Sig: Inject 0.5 mLs into the muscle once for 1 dose.    Dispense:  1 each    Refill:  1    Medications Discontinued During This Encounter  Medication  Reason  . pravastatin (PRAVACHOL) 20 MG tablet Reorder    Follow-up: No follow-ups on file.   [name], MD

## 2021-05-06 NOTE — Patient Instructions (Addendum)
Your Cholesterol ,  thyroid , liver and kidney function are normal.   Your cholesterol has improved compared to prior testing . please continue your current medications and Plan to repeat fasting labs in 6 months.   Urinary frequency without burning is often NOT due to infection but due to changes that occur after menopause,  Or due to diabetes,  Or due to drinking a lot of water     I have put a standing order for a urine test.  You can drop off a urine specimen the next time you have symptoms ,  And we will schedule a follow up to review the results

## 2021-05-09 NOTE — Assessment & Plan Note (Signed)
Likely contributing to her recurrent urinary frequency .  Advised to submit urine when symptoms include dysuria,  And wait for result of culture before taking antibitoics

## 2021-05-09 NOTE — Assessment & Plan Note (Signed)
She has been lowering her A1c with careful diet.  Encouraged to exercise regularly to improve insulin sensitivity  Lab Results  Component Value Date   HGBA1C 6.0 (A) 02/04/2021

## 2021-05-09 NOTE — Assessment & Plan Note (Signed)
Reviewed findings of prior CT scan today..  Patient is tolerating pravastatin  And last LDL was 70.  No changes today   . 

## 2021-05-20 ENCOUNTER — Other Ambulatory Visit: Payer: Self-pay | Admitting: Cardiovascular Disease

## 2021-05-20 NOTE — Telephone Encounter (Signed)
Refill request

## 2021-05-20 NOTE — Telephone Encounter (Signed)
Pt's age 85, wt 72.8 kg, SCr 0.79, CrCl 62.01, last ov w/ MA 01/29/21.

## 2021-05-27 ENCOUNTER — Ambulatory Visit: Payer: Medicare Other

## 2021-06-03 ENCOUNTER — Encounter: Payer: Self-pay | Admitting: Internal Medicine

## 2021-06-03 ENCOUNTER — Other Ambulatory Visit: Payer: Self-pay

## 2021-06-03 ENCOUNTER — Ambulatory Visit (INDEPENDENT_AMBULATORY_CARE_PROVIDER_SITE_OTHER): Payer: Medicare Other | Admitting: Internal Medicine

## 2021-06-03 DIAGNOSIS — L700 Acne vulgaris: Secondary | ICD-10-CM | POA: Diagnosis not present

## 2021-06-03 DIAGNOSIS — G4700 Insomnia, unspecified: Secondary | ICD-10-CM | POA: Diagnosis not present

## 2021-06-03 DIAGNOSIS — E7800 Pure hypercholesterolemia, unspecified: Secondary | ICD-10-CM

## 2021-06-03 DIAGNOSIS — I4819 Other persistent atrial fibrillation: Secondary | ICD-10-CM

## 2021-06-03 DIAGNOSIS — I10 Essential (primary) hypertension: Secondary | ICD-10-CM | POA: Diagnosis not present

## 2021-06-03 MED ORDER — DOXYCYCLINE HYCLATE 100 MG PO TABS: 100.0000 mg | ORAL_TABLET | Freq: Two times a day (BID) | ORAL | 0 refills | Status: DC

## 2021-06-03 NOTE — Patient Instructions (Addendum)
I recommend trying melatonin for your insomnia.  It is not a sedative,  But must be taken on  a regular basis to help your internal clock.  Take every evening after dinner start with 3 mg dose   Max effective dose is 6 mg  You do not have a tick on your vagina.  You have a small blackhead. Leave it alone   [facility] Spotted Fever  can START  with fever and a headache, NOT a rash;  the occurrence of these symptoms during spring/summer/fall in the context of recent tick exposure is RMSF until proven otherwise and should be treated immediately with doxycycline.  I have sent this rx to your pharmacy to use if you develop these symptoms.  Let me know if this occurs so we can set you up for the appropriate testing .

## 2021-06-03 NOTE — Assessment & Plan Note (Signed)
I recommended  trying melatonin for  Her insomnia.  Explained that It is not a sedative,  But must be taken on  a regular basis to help your internal clock.  Take every evening after dinner start with 3 mg dose   Max effective dose is 6 mg

## 2021-06-03 NOTE — Assessment & Plan Note (Addendum)
Patient reassured that she does not have a tick on her labia. The are in question has a small comedone that is not enflamed.

## 2021-06-03 NOTE — Progress Notes (Signed)
Subjective:  Patient ID: [name] [name], female    DOB: [birth year]  Age: 85 y.o. MRN: [id number]  CC: Diagnoses of Comedone, Insomnia, unspecified type, Essential hypertension, Persistent atrial fibrillation ([place]), and Pure hypercholesterolemia were pertinent to this visit.  HPI [name] [name] presents for evaluation of vaginal issue  This visit occurred during the SARS-CoV-2 public health emergency.  Safety protocols were in place, including screening questions prior to the visit, additional usage of staff PPE, and extensive cleaning of exam room while observing appropriate contact time as indicated for disinfecting solutions.   85 yr old female presents with a small bump on her labia majora that is non tender but new.  She and her 58 yr old husband spent several hours working in their field and she is concerned that she may have picked up a tick.  She denies fevers,  headaches and rash.      HTN:  Patient is taking her medications as prescribed (metropolol and diltiazem) and notes no adverse effects.  Home BP readings have been done about once per week and are  generally < 130/80 .  She is avoiding added salt in her diet and walking regularly about 3 times per week for exercise .   Outpatient Medications Prior to Visit  Medication Sig Dispense Refill   acetaminophen (TYLENOL) 500 MG tablet Take 1,000 mg by mouth every 6 (six) hours as needed (pain).      aluminum-magnesium hydroxide 200-200 MG/5ML suspension Take 20 mLs by mouth every 6 (six) hours as needed for indigestion.      Carboxymethylcellul-Glycerin (LUBRICATING EYE DROPS OP) Place 1 drop into both eyes daily as needed (dry eyes).     chlorhexidine (PERIDEX) 0.12 % solution Use as directed in the mouth or throat.     cholecalciferol (VITAMIN D) 1000 units tablet Take 1,000 Units by mouth daily.     Coenzyme Q10 (COQ10) 200 MG CAPS Take 200 mg by mouth daily.     diltiazem (CARDIZEM CD) 180 MG 24 hr capsule Take 1 capsule (180  mg total) by mouth every evening. 90 capsule 0   ELIQUIS 5 MG TABS tablet Take 1 tablet (5 mg total) by mouth 2 (two) times daily. 60 tablet 6   famotidine (PEPCID) 20 MG tablet Take 1 tablet (20 mg total) by mouth at bedtime. 30 tablet 3   metoprolol tartrate (LOPRESSOR) 50 MG tablet Take 1 tablet (50 mg total) by mouth in the morning, at noon, and at bedtime. 270 tablet 0   pantoprazole (PROTONIX) 40 MG tablet Take 1 tablet (40 mg total) by mouth daily. 30 tablet 6   pravastatin (PRAVACHOL) 20 MG tablet Take 1 tablet (20 mg total) by mouth daily. 90 tablet 2   Probiotic Product (PROBIOTIC ADVANCED PO) Take 1 tablet by mouth daily.     sodium chloride (OCEAN) 0.65 % SOLN nasal spray Place 1 spray into both nostrils daily as needed for congestion.     No facility-administered medications prior to visit.    Review of Systems;  Patient denies headache, fevers, malaise, unintentional weight loss, skin rash, eye pain, sinus congestion and sinus pain, sore throat, dysphagia,  hemoptysis , cough, dyspnea, wheezing, chest pain, palpitations, orthopnea, edema, abdominal pain, nausea, melena, diarrhea, constipation, flank pain, dysuria, hematuria, urinary  Frequency, nocturia, numbness, tingling, seizures,  Focal weakness, Loss of consciousness,  Tremor, insomnia, depression, anxiety, and suicidal ideation.      Objective:  BP 132/74 (BP Location: Left Arm, Patient Position:  Sitting, Cuff Size: Normal)   Pulse 91   Temp (!) 95.7 F (35.4 C) (Temporal)   Resp 15   Ht 5\' 3"  (1.6 m)   Wt 161 lb 3.2 oz (73.1 kg)   SpO2 97%   BMI 28.56 kg/m   BP Readings from Last 3 Encounters:  06/03/21 132/74  05/06/21 126/68  04/06/21 128/70    Wt Readings from Last 3 Encounters:  06/03/21 161 lb 3.2 oz (73.1 kg)  05/06/21 160 lb 6.4 oz (72.8 kg)  04/06/21 160 lb 3.2 oz (72.7 kg)    General appearance: alert, cooperative and appears stated age Ears: normal TM's and external ear canals both  ears Throat: lips, mucosa, and tongue normal; teeth and gums normal Neck: no adenopathy, no carotid bruit, supple, symmetrical, trachea midline and thyroid not enlarged, symmetric, no tenderness/mass/nodules Back: symmetric, no curvature. ROM normal. No CVA tenderness. Lungs: clear to auscultation bilaterally Heart: regular rate and rhythm, S1, S2 normal, no murmur, click, rub or gallop Abdomen: soft, non-tender; bowel sounds normal; no masses,  no organomegaly GYN:  right labia major with small comedone,  no evidence of tick or other insect  Pulses: 2+ and symmetric Skin: Skin color, texture, turgor normal. No rashes or lesions Lymph nodes: Cervical, supraclavicular, and axillary nodes normal.  Lab Results  Component Value Date   HGBA1C 6.0 (A) 02/04/2021   HGBA1C 6.2 05/15/2020   HGBA1C 6.4 12/20/2019    Lab Results  Component Value Date   CREATININE 0.79 05/04/2021   CREATININE 0.80 01/29/2021   CREATININE 0.78 05/15/2020    Lab Results  Component Value Date   WBC 7.1 01/29/2021   HGB 14.1 01/29/2021   HCT 42.0 01/29/2021   PLT 287 01/29/2021   GLUCOSE 114 (H) 05/04/2021   CHOL 138 05/04/2021   TRIG 68.0 05/04/2021   HDL 60.20 05/04/2021   LDLCALC 64 05/04/2021   ALT 18 05/04/2021   AST 18 05/04/2021   NA 136 05/04/2021   K 4.8 05/04/2021   CL 102 05/04/2021   CREATININE 0.79 05/04/2021   BUN 16 05/04/2021   CO2 27 05/04/2021   TSH 0.61 05/04/2021   INR 1.3 02/12/2019   HGBA1C 6.0 (A) 02/04/2021   MICROALBUR 8.3 05/25/2017    CT Head Wo Contrast  Result Date: 08/31/2020 CLINICAL DATA:  85 year old who is currently anticoagulated on Eliquis who has fallen multiple times in the past 3 days, striking her head at least once. Bruising to the nose. Occipital headache. Initial encounter. EXAM: CT HEAD WITHOUT CONTRAST CT MAXILLOFACIAL WITHOUT CONTRAST TECHNIQUE: Multidetector CT imaging of the head and maxillofacial structures were performed using the standard  protocol without intravenous contrast. Multiplanar CT image reconstructions of the maxillofacial structures were also generated. A metallic BB was placed on the right temple in order to reliably differentiate right from left. COMPARISON:  CT head 03/24/2020 and earlier. No prior maxillofacial CT. FINDINGS: CT HEAD FINDINGS Brain: Ventricular system normal in size and appearance for age. Mild age related cortical atrophy, unchanged dating back to at least 2015. No mass lesion. No midline shift. No acute hemorrhage or hematoma. No extra-axial fluid collections. No evidence of acute infarction. Vascular: No hyperdense vessel.  No visible atherosclerosis. Skull: No skull fracture or other focal osseous abnormality involving the skull. Other: None. CT MAXILLOFACIAL FINDINGS Osseous: Slight inwardly displaced fracture of the RIGHT nasal bone (3/33). No other facial bone fractures. Temporomandibular joints anatomically aligned without significant degenerative changes. Orbits: No fractures. Expansion of the INFERIOR  orbital canal on the RIGHT with soft tissue that extends into the SUPERIOR aspect of the RIGHT maxillary sinus. Normal appearing LEFT orbit. Sinuses: Minimal to mild mucosal thickening involving the RIGHT maxillary sinus. Remaining paranasal sinuses well aerated. BILATERAL mastoid air cells and BILATERAL middle ear cavities well-aerated. Soft tissues: No evidence of hematoma involving the facial soft tissues. IMPRESSION: 1. No acute intracranial abnormality. 2. Slight inwardly displaced fracture of the RIGHT nasal bone. No other facial bone fractures identified. 3. 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 Non-emergent MRI of the face without and with contrast is recommended in further evaluation. 4. Minimal to mild chronic RIGHT maxillary sinus disease. Electronically  Signed   By: Evangeline Dakin M.D.   On: 08/31/2020 13:44   CT Maxillofacial Wo Contrast  Result Date: 08/31/2020 CLINICAL DATA:  85 year old who is currently anticoagulated on Eliquis who has fallen multiple times in the past 3 days, striking her head at least once. Bruising to the nose. Occipital headache. Initial encounter. EXAM: CT HEAD WITHOUT CONTRAST CT MAXILLOFACIAL WITHOUT CONTRAST TECHNIQUE: Multidetector CT imaging of the head and maxillofacial structures were performed using the standard protocol without intravenous contrast. Multiplanar CT image reconstructions of the maxillofacial structures were also generated. A metallic BB was placed on the right temple in order to reliably differentiate right from left. COMPARISON:  CT head 03/24/2020 and earlier. No prior maxillofacial CT. FINDINGS: CT HEAD FINDINGS Brain: Ventricular system normal in size and appearance for age. Mild age related cortical atrophy, unchanged dating back to at least 2015. No mass lesion. No midline shift. No acute hemorrhage or hematoma. No extra-axial fluid collections. No evidence of acute infarction. Vascular: No hyperdense vessel.  No visible atherosclerosis. Skull: No skull fracture or other focal osseous abnormality involving the skull. Other: None. CT MAXILLOFACIAL FINDINGS Osseous: Slight inwardly displaced fracture of the RIGHT nasal bone (3/33). No other facial bone fractures. Temporomandibular joints anatomically aligned without significant degenerative changes. Orbits: No fractures. 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 Normal appearing LEFT orbit. Sinuses: Minimal to mild mucosal thickening involving the RIGHT maxillary sinus. Remaining paranasal sinuses well aerated. BILATERAL mastoid air cells and BILATERAL middle ear cavities well-aerated. Soft tissues: No evidence of hematoma involving the facial soft tissues.  IMPRESSION: 1. No acute intracranial abnormality. 2. Slight inwardly displaced fracture of the RIGHT nasal bone. No other facial bone fractures identified. 3. 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 Non-emergent MRI of the face without and with contrast is recommended in further evaluation. 4. Minimal to mild chronic RIGHT maxillary sinus disease. Electronically Signed   By: Evangeline Dakin M.D.   On: 08/31/2020 13:44    Assessment & Plan:   Problem List Items Addressed This Visit       Unprioritized   Hyperlipidemia    Tolerating  Pravastatin. She has minimal aortic atherosclerosis ,  High potency statin not advised by cardiology.  LDL and triglycerides are at goal on current medications. she has no side effects and liver enzymes are normal. No changes today   Lab Results  Component Value Date   CHOL 138 05/04/2021   HDL 60.20 05/04/2021  LDLCALC 64 05/04/2021   TRIG 68.0 05/04/2021   CHOLHDL 2 05/04/2021   Lab Results  Component Value Date   ALT 18 05/04/2021   AST 18 05/04/2021   ALKPHOS 67 05/04/2021   BILITOT 1.0 05/04/2021          Essential hypertension    Well controlled on current regimen. Renal function stable, no changes today.       Persistent atrial fibrillation (HCC)    Managed with metoprolol 50 mg bid  And diltiazem 180 mg daily   Continue Eliquis  For embolic stroke risk mitigation       Comedone    Patient reassured that she does not have a tick on her labia. The are in question has a small comedone that is not enflamed.        Relevant Medications   doxycycline (VIBRA-TABS) 100 MG tablet   Insomnia    I recommended  trying melatonin for  Her insomnia.  Explained that It is not a sedative,  But must be taken on  a regular basis to help your internal clock.  Take every evening after dinner start with 3 mg  dose   Max effective dose is 6 mg         I am having Shalondra A. Mansour start on doxycycline. I am also having her maintain her acetaminophen, aluminum-magnesium hydroxide, Probiotic Product (PROBIOTIC ADVANCED PO), cholecalciferol, CoQ10, Carboxymethylcellul-Glycerin (LUBRICATING EYE DROPS OP), sodium chloride, chlorhexidine, pantoprazole, famotidine, pravastatin, diltiazem, metoprolol tartrate, and Eliquis.  Meds ordered this encounter  Medications   doxycycline (VIBRA-TABS) 100 MG tablet    Sig: Take 1 tablet (100 mg total) by mouth 2 (two) times daily.    Dispense:  10 tablet    Refill:  0    There are no discontinued medications.  Follow-up: No follow-ups on file.   [name], MD

## 2021-06-05 NOTE — Assessment & Plan Note (Signed)
Managed with metoprolol 50 mg bid  And diltiazem 180 mg daily   Continue Eliquis  For embolic stroke risk mitigation

## 2021-06-05 NOTE — Assessment & Plan Note (Signed)
Tolerating  Pravastatin. She has minimal aortic atherosclerosis ,  High potency statin not advised by cardiology.  LDL and triglycerides are at goal on current medications. she has no side effects and liver enzymes are normal. No changes today   Lab Results  Component Value Date   CHOL 138 05/04/2021   HDL 60.20 05/04/2021   LDLCALC 64 05/04/2021   TRIG 68.0 05/04/2021   CHOLHDL 2 05/04/2021   Lab Results  Component Value Date   ALT 18 05/04/2021   AST 18 05/04/2021   ALKPHOS 67 05/04/2021   BILITOT 1.0 05/04/2021

## 2021-06-05 NOTE — Assessment & Plan Note (Signed)
Well controlled on current regimen. Renal function stable, no changes today. 

## 2021-07-03 DIAGNOSIS — S6992XA Unspecified injury of left wrist, hand and finger(s), initial encounter: Secondary | ICD-10-CM | POA: Diagnosis not present

## 2021-07-10 DIAGNOSIS — S61412A Laceration without foreign body of left hand, initial encounter: Secondary | ICD-10-CM | POA: Diagnosis not present

## 2021-07-13 ENCOUNTER — Ambulatory Visit: Payer: Medicare Other | Admitting: Internal Medicine

## 2021-07-13 ENCOUNTER — Encounter: Payer: Self-pay | Admitting: Internal Medicine

## 2021-07-13 VITALS — BP 138/68 | HR 60 | Ht 63.0 in | Wt 164.8 lb

## 2021-07-13 DIAGNOSIS — K5900 Constipation, unspecified: Secondary | ICD-10-CM

## 2021-07-13 DIAGNOSIS — K30 Functional dyspepsia: Secondary | ICD-10-CM

## 2021-07-13 DIAGNOSIS — K219 Gastro-esophageal reflux disease without esophagitis: Secondary | ICD-10-CM

## 2021-07-13 NOTE — Patient Instructions (Signed)
Continue clearlax once daily for constipation.  Change how you take your pantoprazole. You should take it 30 minutes before your breakfast meal. If you continue having reflux/indigestion after 2 weeks of making this change, call our office at [phone].  If you are age 83 or older, your body mass index should be between 23-30. Your Body mass index is 29.19 kg/m. If this is out of the aforementioned range listed, please consider follow up with your Primary Care Provider. __________________________________________________________  The [facility] providers would like to encourage you to use [facility] to communicate with providers for non-urgent requests or questions.  Due to long hold times on the telephone, sending your provider a message by [facility] may be a faster and more efficient way to get a response.  Please allow 48 business hours for a response.  Please remember that this is for non-urgent requests.   Due to recent changes in healthcare laws, you may see the results of your imaging and laboratory studies on MyChart before your provider has had a chance to review them.  We understand that in some cases there may be results that are confusing or concerning to you. Not all laboratory results come back in the same time frame and the provider may be waiting for multiple results in order to interpret others.  Please give [place] 48 hours in order for your provider to thoroughly review all the results before contacting the office for clarification of your results.

## 2021-07-13 NOTE — Progress Notes (Signed)
   Subjective:    Patient ID: [name] [name], female    DOB: Aug 29, 1936, 85 y.o.   MRN: [name]  HPI [name] [name] is an 85 year old female with a history of GERD, hypertension, hyperlipidemia, A. fib, sleep apnea and CKD who is seen in follow-up.  I saw her last in April 2022.  She is here today with her husband.  At the time of her last visit she was having some breakthrough heartburn and indigestion symptoms.  She continued pantoprazole and we added famotidine 20 mg in the evening.  She reports that the famotidine she had to stop because it caused some dizziness and was ineffective for her.  She has noticed that she continues to have intermittent issues with heartburn and indigestion symptoms.  She is using Mylanta periodically and last used this this morning.  She denies dysphagia and odynophagia.  She is able to eat without pain.  She is using her pantoprazole 40 mg at bedtime.  She also tried Benefiber to help with mild constipation and incomplete evacuation.  She did not find this helpful and she has tried ClearLax 17 g daily and is found this to be very helpful.  Her bowel movements have normalized.  No lower abdominal pain or blood in stool.  She was using a weed eater recently and the line got wrapped around a pole which jerked the equipment out of her hand and caused a skin tear.  This required wound closure and antibiotic treatment.  She followed up recently and this is healing well.   Review of Systems As per HPI, otherwise negative  Current Medications, Allergies, Past Medical History, Past Surgical History, Family History and Social History were reviewed in [employer] record.    Objective:   Physical Exam BP 138/68   Pulse 60   Ht '5\' 3"'$  (1.6 m)   Wt 164 lb 12.8 oz (74.8 kg)   BMI 29.19 kg/m  Gen: awake, alert, NAD HEENT: anicteric Ext: Left hand and wrist covered with Ace bandage Neuro: nonfocal     Assessment & Plan:  85 year old female  with a history of GERD, hypertension, hyperlipidemia, A. fib, sleep apnea and CKD who is seen in follow-up.   GERD/indigestion --she did not tolerate famotidine and is still having some breakthrough heartburn.  This may be due to the fact that she is taking pantoprazole at bedtime.  We discussed how this can decrease efficacy of PPI therapy.  I suggested the following: --Move pantoprazole 40 mg to 30 minutes before first meal of the day; she will take this immediately on getting up in the morning. --She can still use Mylanta if needed for breakthrough however if she is still having breakthrough heartburn and indigestion I suggested she let me know.  We could consider twice daily pantoprazole --No alarm symptoms to warrant EGD at this time --We reviewed GERD diet, she is drinking orange juice in the morning and we discussed how acidic fruits and juices can worsen heartburn and indigestion  2.  Mild constipation --doing well with ClearLax, continue this once daily.  She did not find Benefiber helpful.  Annual follow-up unless symptoms remain uncontrolled  20 minutes total spent today including patient facing time, coordination of care, reviewing medical history/procedures/pertinent radiology studies, and documentation of the encounter.

## 2021-07-15 ENCOUNTER — Ambulatory Visit: Payer: Medicare Other | Admitting: Dermatology

## 2021-07-15 ENCOUNTER — Other Ambulatory Visit: Payer: Self-pay

## 2021-07-15 DIAGNOSIS — L820 Inflamed seborrheic keratosis: Secondary | ICD-10-CM

## 2021-07-15 DIAGNOSIS — Z85828 Personal history of other malignant neoplasm of skin: Secondary | ICD-10-CM | POA: Diagnosis not present

## 2021-07-15 DIAGNOSIS — L821 Other seborrheic keratosis: Secondary | ICD-10-CM | POA: Diagnosis not present

## 2021-07-15 DIAGNOSIS — L578 Other skin changes due to chronic exposure to nonionizing radiation: Secondary | ICD-10-CM

## 2021-07-15 NOTE — Patient Instructions (Signed)

## 2021-07-15 NOTE — Progress Notes (Signed)
   Follow-Up Visit   Subjective  [name] [name] is a 85 y.o. female who presents for the following: skin lesions (On the L leg and back - irregular and irritated, patient is concerned and would like them checked.).  The following portions of the chart were reviewed this encounter and updated as appropriate:   Tobacco  Allergies  Meds  Problems  Med Hx  Surg Hx  Fam Hx     Review of Systems:  No other skin or systemic complaints except as noted in HPI or Assessment and Plan.  Objective  Well appearing patient in no apparent distress; mood and affect are within normal limits.  A focused examination was performed including the face, trunk, and extremities. Relevant physical exam findings are noted in the Assessment and Plan.  legs and low back x 17 (17) Erythematous keratotic or waxy stuck-on papule or plaque.    Assessment & Plan  Inflamed seborrheic keratosis legs and low back x 17  Destruction of lesion - legs and low back x 17 Complexity: simple   Destruction method: cryotherapy   Informed consent: discussed and consent obtained   Timeout:  patient name, date of birth, surgical site, and procedure verified Lesion destroyed using liquid nitrogen: Yes   Region frozen until ice ball extended beyond lesion: Yes   Outcome: patient tolerated procedure well with no complications   Post-procedure details: wound care instructions given    Actinic Damage - chronic, secondary to cumulative UV radiation exposure/sun exposure over time - diffuse scaly erythematous macules with underlying dyspigmentation - Recommend daily broad spectrum sunscreen SPF 30+ to sun-exposed areas, reapply every 2 hours as needed.  - Recommend staying in the shade or wearing long sleeves, sun glasses (UVA+UVB protection) and wide brim hats (4-inch brim around the entire circumference of the hat). - Call for new or changing lesions.  Seborrheic Keratoses - Stuck-on, waxy, tan-brown papules and/or  plaques  - Benign-appearing - Discussed benign etiology and prognosis. - Observe - Call for any changes  History of Squamous Cell Carcinoma of the Skin - No evidence of recurrence today - No lymphadenopathy - Recommend regular full body skin exams - Recommend daily broad spectrum sunscreen SPF 30+ to sun-exposed areas, reapply every 2 hours as needed.  - Call if any new or changing lesions are noted between office visits  Return in about 6 months (around 01/15/2022) for TBSE.  Luther Redo, CMA, am acting as scribe for Sarina Ser, MD . Documentation: I have reviewed the above documentation for accuracy and completeness, and I agree with the above.  Sarina Ser, MD

## 2021-07-16 ENCOUNTER — Encounter: Payer: Self-pay | Admitting: Dermatology

## 2021-07-22 ENCOUNTER — Telehealth: Payer: Self-pay | Admitting: Cardiovascular Disease

## 2021-07-22 NOTE — Telephone Encounter (Signed)
Pt c/o of Chest Pain: STAT if CP now or developed within 24 hours  1. Are you having CP right now? Not bad, feels like indigestion on the left side  2. Are you experiencing any other symptoms (ex. SOB, nausea, vomiting, sweating)? Weakness, indigestion like feeling.  3. How long have you been experiencing CP? About a week  4. Is your CP continuous or coming and going? continual  5. Have you taken Nitroglycerin? no ?

## 2021-07-22 NOTE — Telephone Encounter (Signed)
I spoke with the patient. She states for the last 2 mornings she has been feeling dizzy when getting up. She reports her HR's have been running in the upper 80s- low-mid 90s.  07/21/21: HR 95 when getting up, she took metoprolol  ~2-3 hours later HR- 88,  Early afternoon she was 91, she took metoprolol Last night HR 91, she took metoprolol again  07/22/21: HR 95 when getting up, BP- 150/90 11:00 am- HR 89, BP 127/64.  I inquired what her HR's are typically running and she advised "they vary," but usually mid 80's.  I inquired if she takes metoprolol tartrate 50 mg TID every day and she advised she does take this BID, but only take the mid day dose when HR's are elevated.   I inquired if she feels her dizziness correlates with her elevated AM HR's and she feels this is the case.  She states she has done a little more yard work lately, but is staying inside during the hottest part of the and she is drinking gatorade.   She takes diltiazem 180 mg once daily at night.   She is taking her eliquis BID as well.   The patient reports some feeling so indigestion, but she recently saw Dr. Hilarie Fredrickson on 07/13/21 and he advised her:  Change how you take your pantoprazole. You should take it 30 minutes before your breakfast meal. If you continue having reflux/indigestion after 2 weeks of making this change, call our office at [phone].  I have advised the patient I will forward her message to Dr. Fletcher Anon to see if he feels medication changes are necessary for a little better HR control. She is aware we will call back with further recommendations.  She is also advised to call Dr. Vena Rua office back if she feels her indigestion is no better.  The patient is voices understanding and is agreeable.

## 2021-07-23 NOTE — Telephone Encounter (Signed)
Probably best if she takes metoprolol 3 times daily on a regular basis.  Her heart rate seems to be on the high side.

## 2021-07-23 NOTE — Telephone Encounter (Signed)
I spoke with the patient. I have advised her of Dr. Tyrell Antonio recommendations to take her metoprolol tartrate 50 mg TID every day for more optimal HR control.  The patient voices understanding and is agreeable.

## 2021-08-06 ENCOUNTER — Telehealth: Payer: Self-pay | Admitting: Cardiovascular Disease

## 2021-08-06 NOTE — Telephone Encounter (Signed)
STAT if HR is under 50 or over 120 (normal HR is 60-100 beats per minute)  What is your heart rate? HR 73  Do you have a log of your heart rate readings (document readings)? No log  Do you have any other symptoms? Headache, just doesn't feel good. States her heart rate is usually in the 80s

## 2021-08-06 NOTE — Telephone Encounter (Signed)
Spoke with the patient. Adv her that the appt with Dr. Fletcher Anon has been out on for the original date and time 08/11/21 @ 1:40pm.  Patient sts that she is doing ok today. She has noticed that her heart running in the 60-70's bpm at times and this is unusual for her. She denies chest pain, sob, palpitations, swelling.  Adv the patient to keep the 08/11/21 appt with Dr. Fletcher Anon. She is to contact the office in the interim if needed. Patient verbalized understanding and voiced appreciation for the call.

## 2021-08-11 ENCOUNTER — Ambulatory Visit: Payer: Medicare Other | Admitting: Cardiovascular Disease

## 2021-08-14 ENCOUNTER — Telehealth: Payer: Self-pay | Admitting: Cardiovascular Disease

## 2021-08-14 NOTE — Telephone Encounter (Signed)
Returned the patients call. Lmtcb if assistance is still needed. We do not prescribe anti-anxiety medication. Patient will need to contact her pcp Dr. Lupita Dawn office.

## 2021-08-14 NOTE — Telephone Encounter (Signed)
Patient states with her husband just being released from the hospital she needs something for her anxiety. Please call to discuss.

## 2021-08-18 ENCOUNTER — Other Ambulatory Visit: Payer: Self-pay | Admitting: Cardiovascular Disease

## 2021-08-20 ENCOUNTER — Telehealth: Payer: Self-pay | Admitting: Internal Medicine

## 2021-08-20 NOTE — Telephone Encounter (Signed)
Spoke with pt and scheduled her for an appt on 09/09/2021. The pt wanted to know if she needed to be seen sooner because she is waking up every morning with a headache and her husband has noticed her memory is getting worse.

## 2021-08-20 NOTE — Telephone Encounter (Signed)
Patient is having headaches and wants to have labs done to check her cholesterol.No openings with Dr.Tullo until 9/28, please advise.

## 2021-08-20 NOTE — Telephone Encounter (Signed)
No sooner availability. Pt is aware and will keep her appt on 9/21/222.

## 2021-08-31 ENCOUNTER — Other Ambulatory Visit: Payer: Self-pay | Admitting: Internal Medicine

## 2021-08-31 DIAGNOSIS — Z1231 Encounter for screening mammogram for malignant neoplasm of breast: Secondary | ICD-10-CM

## 2021-09-03 ENCOUNTER — Other Ambulatory Visit: Payer: Self-pay

## 2021-09-03 ENCOUNTER — Ambulatory Visit
Admission: RE | Admit: 2021-09-03 | Discharge: 2021-09-03 | Disposition: A | Discharge status: Home / Self Care | Payer: Medicare Other | Source: Ambulatory Visit | Attending: Internal Medicine | Admitting: Internal Medicine

## 2021-09-03 DIAGNOSIS — Z1231 Encounter for screening mammogram for malignant neoplasm of breast: Secondary | ICD-10-CM | POA: Diagnosis not present

## 2021-09-04 ENCOUNTER — Telehealth: Payer: Self-pay | Admitting: *Deleted

## 2021-09-04 NOTE — Telephone Encounter (Signed)
Pt is here in the office and requests Eliquis sample.  Please advise if office can supply sample for Eliquis 5 mg.  Forwarding to anti-coag pool.

## 2021-09-04 NOTE — Telephone Encounter (Signed)
Sample request for Eliquis received. Indication: AFib Last office visit: 01/29/21 Scr: 0.79 Age: 85 Weight: 74kg  Patient's Eliquis dose for sample is '5mg'$  BID

## 2021-09-09 ENCOUNTER — Ambulatory Visit (INDEPENDENT_AMBULATORY_CARE_PROVIDER_SITE_OTHER): Payer: Medicare Other | Admitting: Internal Medicine

## 2021-09-09 ENCOUNTER — Other Ambulatory Visit: Payer: Self-pay

## 2021-09-09 ENCOUNTER — Other Ambulatory Visit: Payer: Self-pay | Admitting: Cardiovascular Disease

## 2021-09-09 VITALS — BP 138/78 | HR 82 | Temp 97.6°F | Ht 63.0 in | Wt 158.2 lb

## 2021-09-09 DIAGNOSIS — I10 Essential (primary) hypertension: Secondary | ICD-10-CM

## 2021-09-09 DIAGNOSIS — F32A Depression, unspecified: Secondary | ICD-10-CM | POA: Diagnosis not present

## 2021-09-09 DIAGNOSIS — D6859 Other primary thrombophilia: Secondary | ICD-10-CM

## 2021-09-09 DIAGNOSIS — F419 Anxiety disorder, unspecified: Secondary | ICD-10-CM

## 2021-09-09 DIAGNOSIS — E559 Vitamin D deficiency, unspecified: Secondary | ICD-10-CM

## 2021-09-09 DIAGNOSIS — E871 Hypo-osmolality and hyponatremia: Secondary | ICD-10-CM

## 2021-09-09 DIAGNOSIS — E7800 Pure hypercholesterolemia, unspecified: Secondary | ICD-10-CM

## 2021-09-09 LAB — COMPREHENSIVE METABOLIC PANEL
ALT: 19 U/L (ref 0–35)
AST: 22 U/L (ref 0–37)
Albumin: 4.3 g/dL (ref 3.5–5.2)
Alkaline Phosphatase: 74 U/L (ref 39–117)
BUN: 13 mg/dL (ref 6–23)
CO2: 28 mEq/L (ref 19–32)
Calcium: 10 mg/dL (ref 8.4–10.5)
Chloride: 97 mEq/L (ref 96–112)
Creatinine, Ser: 0.76 mg/dL (ref 0.40–1.20)
GFR: 72.35 mL/min (ref >60.00)
Glucose, Bld: 88 mg/dL (ref 70–99)
Potassium: 4.5 mEq/L (ref 3.5–5.1)
Sodium: 133 mEq/L — ABNORMAL LOW (ref 135–145)
Total Bilirubin: 0.9 mg/dL (ref 0.2–1.2)
Total Protein: 7.9 g/dL (ref 6.0–8.3)

## 2021-09-09 LAB — LIPID PANEL
Cholesterol: 155 mg/dL (ref 0–200)
HDL: 65.2 mg/dL (ref >39.00)
LDL Cholesterol: 72 mg/dL (ref 0–99)
NonHDL: 89.4
Total CHOL/HDL Ratio: 2
Triglycerides: 85 mg/dL (ref 0.0–149.0)
VLDL: 17 mg/dL (ref 0.0–40.0)

## 2021-09-09 LAB — CBC WITH DIFFERENTIAL/PLATELET
Basophils Absolute: 0 10*3/uL (ref 0.0–0.1)
Basophils Relative: 0.5 % (ref 0.0–3.0)
Eosinophils Absolute: 0 10*3/uL (ref 0.0–0.7)
Eosinophils Relative: 0.7 % (ref 0.0–5.0)
HCT: 42.4 % (ref 36.0–46.0)
Hemoglobin: 13.9 g/dL (ref 12.0–15.0)
Lymphocytes Relative: 11.5 % — ABNORMAL LOW (ref 12.0–46.0)
Lymphs Abs: 0.8 10*3/uL (ref 0.7–4.0)
MCHC: 32.9 g/dL (ref 30.0–36.0)
MCV: 95.5 fl (ref 78.0–100.0)
Monocytes Absolute: 0.5 10*3/uL (ref 0.1–1.0)
Monocytes Relative: 6.9 % (ref 3.0–12.0)
Neutro Abs: 5.8 10*3/uL (ref 1.4–7.7)
Neutrophils Relative %: 80.4 % — ABNORMAL HIGH (ref 43.0–77.0)
Platelets: 293 10*3/uL (ref 150.0–400.0)
RBC: 4.44 Mil/uL (ref 3.87–5.11)
RDW: 13.4 % (ref 11.5–15.5)
WBC: 7.2 10*3/uL (ref 4.0–10.5)

## 2021-09-09 LAB — TSH: TSH: 0.53 u[IU]/mL (ref 0.35–5.50)

## 2021-09-09 LAB — VITAMIN D 25 HYDROXY (VIT D DEFICIENCY, FRACTURES): VITD: 36.04 ng/mL (ref 30.00–100.00)

## 2021-09-09 MED ORDER — SERTRALINE HCL 25 MG PO TABS: 25.0000 mg | ORAL_TABLET | Freq: Every day | ORAL | 1 refills | Status: DC

## 2021-09-09 NOTE — Patient Instructions (Signed)
Please start the  Sertraline (generic for Zoloft) at 1/2 tablet daily in the morning with breakfast for the first week to avoid nausea.  You can increase to a full tablet after 1 week if you havenot developed side effects of nausea.    You should start to eel a difference after two weeks on the full dose

## 2021-09-09 NOTE — Progress Notes (Addendum)
Subjective:  Patient ID: [name] [name], female    DOB: 1936/08/26  Age: 85 y.o. MRN: [id number]  CC: The primary encounter diagnosis was Pure hypercholesterolemia. Diagnoses of Essential hypertension, Thrombophilia ([place]), Vitamin D deficiency, Anxiety and depression, and Hyponatremia were also pertinent to this visit.  HPI [name] presents for management of new onset anxiety and other issues  Chief Complaint  Patient presents with   Follow-up   1) anxiety: husband says she has episodes of tachycardia rate 162 whie she was staying by his side during this hospitalization  .  She slept in the chair next to his  bed for 3 nights.  Husband notes that she has been more forgetful with names, setting the alarm.. primarily worried about Keith's health,  his many appointments are kept on a calendar .  Not missing any appointments.  Denies insomnia,  but has nocturia 2-3 times per day . "I guess I worry too much", worries about everybody in the family . Lots of family living nearby.  She never knew her mother who died when she was 32 ,  she was adopted and raised by another family, but never got along with her adopted father's wife.  Discussed their relationship with their children (his children, from his first marriage, he is  widowed)  patient and husband have been married 28 years. Reads the Bible daily  but has stopped going to [employer] because of [employer] health and decreased endurance and fear of COVID. [place] , planning to return to church when husband's health allows.   2) thrombophilia:  secondary to atrial fib.  Taking Eliquis and    Outpatient Medications Prior to Visit  Medication Sig Dispense Refill   acetaminophen (TYLENOL) 500 MG tablet Take 1,000 mg by mouth every 6 (six) hours as needed (pain).      aluminum-magnesium hydroxide 200-200 MG/5ML suspension Take 20 mLs by mouth every 6 (six) hours as needed for indigestion.      Carboxymethylcellul-Glycerin (LUBRICATING  EYE DROPS OP) Place 1 drop into both eyes daily as needed (dry eyes).     chlorhexidine (PERIDEX) 0.12 % solution Use as directed in the mouth or throat.     cholecalciferol (VITAMIN D) 1000 units tablet Take 1,000 Units by mouth daily.     Coenzyme Q10 (COQ10) 200 MG CAPS Take 200 mg by mouth daily.     diltiazem (CARDIZEM CD) 180 MG 24 hr capsule Take 1 capsule (180 mg total) by mouth every evening. 90 capsule 0   doxycycline (VIBRA-TABS) 100 MG tablet Take 1 tablet (100 mg total) by mouth 2 (two) times daily. 10 tablet 0   ELIQUIS 5 MG TABS tablet Take 1 tablet (5 mg total) by mouth 2 (two) times daily. 60 tablet 6   pantoprazole (PROTONIX) 40 MG tablet Take 1 tablet (40 mg total) by mouth daily. 30 tablet 6   pravastatin (PRAVACHOL) 20 MG tablet Take 1 tablet (20 mg total) by mouth daily. 90 tablet 2   Probiotic Product (PROBIOTIC ADVANCED PO) Take 1 tablet by mouth daily.     sodium chloride (OCEAN) 0.65 % SOLN nasal spray Place 1 spray into both nostrils daily as needed for congestion.     metoprolol tartrate (LOPRESSOR) 50 MG tablet Take 1 tablet (50 mg total) by mouth in the morning, at noon, and at bedtime. 270 tablet 0   No facility-administered medications prior to visit.    Review of Systems;  Patient denies headache, fevers, malaise,  unintentional weight loss, skin rash, eye pain, sinus congestion and sinus pain, sore throat, dysphagia,  hemoptysis , cough, dyspnea, wheezing, chest pain, palpitations, orthopnea, edema, abdominal pain, nausea, melena, diarrhea, constipation, flank pain, dysuria, hematuria, urinary  Frequency, nocturia, numbness, tingling, seizures,  Focal weakness, Loss of consciousness,  Tremor, insomnia, depression, anxiety, and suicidal ideation.      Objective:  BP 138/78   Pulse 82   Temp 97.6 F (36.4 C) (Skin)   Ht 5\' 3"  (1.6 m)   Wt 158 lb 3.2 oz (71.8 kg)   SpO2 97%   BMI 28.02 kg/m   BP Readings from Last 3 Encounters:  09/09/21 138/78   07/13/21 138/68  06/03/21 132/74    Wt Readings from Last 3 Encounters:  09/09/21 158 lb 3.2 oz (71.8 kg)  07/13/21 164 lb 12.8 oz (74.8 kg)  06/03/21 161 lb 3.2 oz (73.1 kg)    General appearance: alert, cooperative and appears stated age Ears: normal TM's and external ear canals both ears Throat: lips, mucosa, and tongue normal; teeth and gums normal Neck: no adenopathy, no carotid bruit, supple, symmetrical, trachea midline and thyroid not enlarged, symmetric, no tenderness/mass/nodules Back: symmetric, no curvature. ROM normal. No CVA tenderness. Lungs: clear to auscultation bilaterally Heart: regular rate and rhythm, S1, S2 normal, no murmur, click, rub or gallop Abdomen: soft, non-tender; bowel sounds normal; no masses,  no organomegaly Pulses: 2+ and symmetric Skin: Skin color, texture, turgor normal. No rashes or lesions Lymph nodes: Cervical, supraclavicular, and axillary nodes normal. Psych: affect anxious,  makes good eye contact. Speaks rapidly, in short sentences.  Frequently interrupted by husband,  smiles easily.  Denies suicidal thoughts    Lab Results  Component Value Date   HGBA1C 6.0 (A) 02/04/2021   HGBA1C 6.2 05/15/2020   HGBA1C 6.4 12/20/2019    Lab Results  Component Value Date   CREATININE 0.76 09/09/2021   CREATININE 0.79 05/04/2021   CREATININE 0.80 01/29/2021    Lab Results  Component Value Date   WBC 7.2 09/09/2021   HGB 13.9 09/09/2021   HCT 42.4 09/09/2021   PLT 293.0 09/09/2021   GLUCOSE 88 09/09/2021   CHOL 155 09/09/2021   TRIG 85.0 09/09/2021   HDL 65.20 09/09/2021   LDLCALC 72 09/09/2021   ALT 19 09/09/2021   AST 22 09/09/2021   NA 133 (L) 09/09/2021   K 4.5 09/09/2021   CL 97 09/09/2021   CREATININE 0.76 09/09/2021   BUN 13 09/09/2021   CO2 28 09/09/2021   TSH 0.53 09/09/2021   INR 1.3 02/12/2019   HGBA1C 6.0 (A) 02/04/2021   MICROALBUR 8.3 05/25/2017    MM 3D SCREEN BREAST W/IMPLANT BILATERAL  Result Date:  09/08/2021 CLINICAL DATA:  Screening. EXAM: DIGITAL SCREENING BILATERAL MAMMOGRAM WITH IMPLANTS, CAD AND TOMOSYNTHESIS TECHNIQUE: Bilateral screening digital craniocaudal and mediolateral oblique mammograms were obtained. Bilateral screening digital breast tomosynthesis was performed. The images were evaluated with computer-aided detection. Standard and/or implant displaced views were performed. COMPARISON:  Previous exam(s). ACR Breast Density Category a: The breast tissue is almost entirely fatty. FINDINGS: The patient has retropectoral implants. There are no findings suspicious for malignancy. IMPRESSION: No mammographic evidence of malignancy. A result letter of this screening mammogram will be mailed directly to the patient. RECOMMENDATION: Screening mammogram in one year. (Code:SM-B-01Y) BI-RADS CATEGORY  1:  Negative. Electronically Signed   By: Fidela Salisbury M.D.   On: 09/08/2021 15:32   Assessment & Plan:   Problem List Items Addressed This  Visit       Unprioritized   Anxiety and depression    Chronic anxiety largely due to worrying about her husband and other family members.  counselling given  Trial of zoloft.        Relevant Medications   sertraline (ZOLOFT) 25 MG tablet   Essential hypertension   Relevant Orders   Comprehensive metabolic panel (Completed)   Hyperlipidemia - Primary    Tolerating  Pravastatin. She has minimal aortic atherosclerosis ,  High potency statin not advised by cardiology.  LDL and triglycerides are at goal on current medications. she has no side effects and liver enzymes are normal. No changes today   Lab Results  Component Value Date   CHOL 155 09/09/2021   HDL 65.20 09/09/2021   LDLCALC 72 09/09/2021   TRIG 85.0 09/09/2021   CHOLHDL 2 09/09/2021   Lab Results  Component Value Date   ALT 19 09/09/2021   AST 22 09/09/2021   ALKPHOS 74 09/09/2021   BILITOT 0.9 09/09/2021         Relevant Orders   Lipid panel (Completed)   TSH  (Completed)   Hyponatremia    Mild,  meds  And history reviewed. No obvious cause.  Repeat in one month,       Relevant Orders   Basic metabolic panel   Thrombophilia ([place])    Secondary to chronic atrial fib  Continue eliquis       Relevant Orders   CBC with Differential/Platelet (Completed)   Vitamin D deficiency   Relevant Orders   Vitamin D (25 hydroxy) (Completed)   I provided  30 minutes of  face-to-face time during this encounter reviewing patient's current problems and past surgeries, labs and imaging studies, providing counseling on her management of anxiety, and coordination  of care .  Meds ordered this encounter  Medications   sertraline (ZOLOFT) 25 MG tablet    Sig: Take 1 tablet (25 mg total) by mouth daily.    Dispense:  90 tablet    Refill:  1    There are no discontinued medications.  Follow-up: Return in about 4 weeks (around 10/07/2021).   [name], MD

## 2021-09-10 ENCOUNTER — Encounter: Payer: Self-pay | Admitting: Internal Medicine

## 2021-09-10 DIAGNOSIS — D6859 Other primary thrombophilia: Secondary | ICD-10-CM | POA: Insufficient documentation

## 2021-09-10 NOTE — Assessment & Plan Note (Signed)
Tolerating  Pravastatin. She has minimal aortic atherosclerosis ,  High potency statin not advised by cardiology.  LDL and triglycerides are at goal on current medications. she has no side effects and liver enzymes are normal. No changes today   Lab Results  Component Value Date   CHOL 155 09/09/2021   HDL 65.20 09/09/2021   LDLCALC 72 09/09/2021   TRIG 85.0 09/09/2021   CHOLHDL 2 09/09/2021   Lab Results  Component Value Date   ALT 19 09/09/2021   AST 22 09/09/2021   ALKPHOS 74 09/09/2021   BILITOT 0.9 09/09/2021

## 2021-09-10 NOTE — Assessment & Plan Note (Signed)
Chronic anxiety largely due to worrying about her husband and other family members.  counselling given  Trial of zoloft.

## 2021-09-10 NOTE — Assessment & Plan Note (Signed)
Secondary to chronic atrial fib  Continue eliquis

## 2021-09-13 DIAGNOSIS — E871 Hypo-osmolality and hyponatremia: Secondary | ICD-10-CM | POA: Insufficient documentation

## 2021-09-13 NOTE — Assessment & Plan Note (Signed)
Mild,  meds  And history reviewed. No obvious cause.  Repeat in one month,

## 2021-09-13 NOTE — Addendum Note (Signed)
Addended by: [name] on: 09/13/2021 06:00 PM   Modules accepted: Orders

## 2021-09-14 ENCOUNTER — Telehealth: Payer: Self-pay

## 2021-09-14 NOTE — Telephone Encounter (Signed)
LMTCB in regards to lab results.  

## 2021-10-05 ENCOUNTER — Telehealth: Payer: Self-pay | Admitting: Internal Medicine

## 2021-10-05 NOTE — Telephone Encounter (Signed)
FYI

## 2021-10-05 NOTE — Telephone Encounter (Signed)
Patient is calling in to let Dr.Tullo know that she took sertraline (ZOLOFT) 25 MG tablet for her memory but it caused her to feel dizzy and weak so she stopped taking the medicine.She also would like Dr.Tullo to know she cancelled her appointment because she has to help her husband.

## 2021-10-07 ENCOUNTER — Telehealth: Payer: Self-pay | Admitting: Cardiovascular Disease

## 2021-10-07 NOTE — Telephone Encounter (Signed)
Eliquis 5 mg refill request received. Patient is 85 years old, weight-71.8 kg, Crea- 0.76 on 09/09/21, Diagnosis-afib, and last seen by Dr. Fletcher Anon on 01/29/21. Dose is appropriate based on dosing criteria. Last refill sent in on 05/20/21 for #60 w/ 6 refills, so she has enough refills to last until December.  Ok to give samples if she is unable to afford to pick up refills.

## 2021-10-07 NOTE — Telephone Encounter (Signed)
Patient would like Eliquis samples.

## 2021-10-07 NOTE — Telephone Encounter (Signed)
Please advise pt requesting refills.

## 2021-10-07 NOTE — Telephone Encounter (Signed)
Pt is aware that we are limited in samples and are for pt who are new starts. Pt will fill out Pt assistance program application and return to our office so that we can fax it toe [place]. Pt was given 1 box of Eliquis 5 mg tablet.  Lot: [id number] Exp. 08/2023

## 2021-10-08 ENCOUNTER — Ambulatory Visit: Payer: Medicare Other | Admitting: Internal Medicine

## 2021-10-12 ENCOUNTER — Telehealth: Payer: Self-pay | Admitting: Cardiovascular Disease

## 2021-10-12 NOTE — Telephone Encounter (Signed)
  Yes, an EKG is fine.

## 2021-10-12 NOTE — Telephone Encounter (Signed)
Pt c/o of Chest Pain: STAT if CP now or developed within 24 hours  1. Are you having CP right now? Not at the moment, last night had some and took some Tylenol  2. Are you experiencing any other symptoms (ex. SOB, nausea, vomiting, sweating)? no  3. How long have you been experiencing CP? Last ninght  4. Is your CP continuous or coming and going? Comes and goes. Yesterday was more continual  5. Have you taken Nitroglycerin? No  Patient requests to come in and have an EKG ?

## 2021-10-13 ENCOUNTER — Encounter: Payer: Self-pay | Admitting: General Surgery

## 2021-10-13 NOTE — Telephone Encounter (Signed)
Spoke with pt.  Pt would like to discuss HR/medication changes with Dr. Fletcher Anon and appt was made prior to this call by scheduling to see Dr. Fletcher Anon this Thursday 10/27 at 4 PM.  Pt c/o "feeling weaker than normal a couple days ago" in addition to shortness of breath after working out doors.  Also notes HR ranging 79-104 and would like to discuss in office visit.  Yesterday 8 AM BP/HR 164/87 79 9 AM BP/HR 121/71 81 1:15 PM (prior to lopressor) HR was 104.   Pt continues medications per med list.  Pt denies chest pain, SOB at this time.  Advised pt to contact office of any change in s/s.  Otherwise, pt will follow up as scheduled.

## 2021-10-13 NOTE — Telephone Encounter (Signed)
Patient states her HR was "up an down" yesterday.  Yesterday started at 25 as the day went on it went to 95.  Please call to discuss.

## 2021-10-15 ENCOUNTER — Encounter: Payer: Self-pay | Admitting: Cardiovascular Disease

## 2021-10-15 ENCOUNTER — Other Ambulatory Visit: Payer: Self-pay

## 2021-10-15 ENCOUNTER — Ambulatory Visit: Payer: Medicare Other | Admitting: Cardiovascular Disease

## 2021-10-15 VITALS — BP 138/90 | HR 96 | Ht 62.0 in | Wt 157.2 lb

## 2021-10-15 DIAGNOSIS — I10 Essential (primary) hypertension: Secondary | ICD-10-CM | POA: Diagnosis not present

## 2021-10-15 DIAGNOSIS — I4821 Permanent atrial fibrillation: Secondary | ICD-10-CM

## 2021-10-15 DIAGNOSIS — R079 Chest pain, unspecified: Secondary | ICD-10-CM

## 2021-10-15 NOTE — Progress Notes (Signed)
Cardiology Office Note   Date:  10/15/2021   ID:  [name] [name], DOB 1936-11-26, MRN [id number]  PCP:  [name], MD  Cardiologist:   [name], MD   Chief Complaint  Patient presents with   Other    Check up c/o weakness and chest discomfort with burning sensation. Meds reviewed verbally with pt.       History of Present Illness: [name] [name] is a 85 y.o. female who presents for a followup visit regarding permanent atrial fibrillation.   She has known history of  atrial fibrillation/flutter status post multiple cardioversions in the past (total of 7). 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  Most recent cardioversion was in Feb of 2018. Echocardiogram in March, 2019 showed an EF of 55 to 60% with mildly dilated left atrium and minimal pulmonary hypertension. She is being treated with rate control at the present time.   Most recent ischemic cardiac evaluation was in July 2020 which included a Lexiscan Myoview which showed possible inferolateral ischemia with normal ejection fraction.  Cardiac catheterization was followed in August which showed minimal irregularities with no evidence of obstructive coronary artery disease, normal ejection fraction mildly elevated left ventricular end-diastolic pressure.   She takes diltiazem extended release 180 mg once daily and metoprolol 50 mg 3 times daily but sometimes she takes half a tablet of metoprolol at night.  When she does that, sometimes her heart rate is high in the morning.  She had few episodes of tachycardia associated with some chest pain.   No dizziness or syncope.  Past Medical History:  Diagnosis Date   Arthritis    Atrial flutter ([place]) 02/2011   s/p cardioversion    Chest pain    a. H/o cardiac cath x 2-> 2012 -->nl cors;  b. 12/2016 MV: EF 61%, small region of mild perfusion defect in the apical anteroseptal region c/w breast attenuation, no ischemia-->Low risk; c.  06/2019 MV: Mod size, mild inflat ischemia, EF 59%. Cor and Ao Ca2+. Inflat defect more pronounced on this study compared to last; c. 07/2019 Cath: LM nl, LAD min irregs, LCX nl, OM1/2/3 nl, RCA min irregs.   CKD (chronic kidney disease), stage III ([place])    Concussion with no loss of consciousness 09/01/2016   Cystocele    Degenerative disorder of bone    GERD (gastroesophageal reflux disease)    Headache(784.0)    chronic   Hiatal hernia    Hyperlipidemia    Hypertension    Influenza 01/10/2017   Knee fracture    Migraines    Mobitz type 2 second degree atrioventricular block    a. felt to be 2/2 amiodarone, resolved with decreased amiodarone dose.  Amio since d/c'd.   Permanent atrial fibrillation (HCC)    a. status post multiple DCCVs; b. 2018 - eval for PVI but opted for rate control.   PONV (postoperative nausea and vomiting)    oxycodone and codiene cause N/V    Pre-syncope    a. In setting of dehydration and AKI in the past.   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 Resolved;  b. 08/2017 Echo: EF 50-55%, no rwma, mild MR, mildly to mod dil LA/RA; c. 02/2018 Echo: EF 55-60%, mild MR. Mildly dil LA. Nl RVSP. PASP 31mmHg.   Venous insufficiency    Vertigo  Past Surgical History:  Procedure Laterality Date   ABDOMINAL HYSTERECTOMY  1990   APPENDECTOMY     AUGMENTATION MAMMAPLASTY Bilateral 1986   implants   AUGMENTATION MAMMAPLASTY  1990   AUGMENTATION MAMMAPLASTY  2011   CARDIAC CATHETERIZATION     CARDIOVERSION     x 3   CARDIOVERSION     CARDIOVERSION N/A 02/07/2017   Procedure: CARDIOVERSION;  Surgeon: Wellington Hampshire, MD;  Location: [facility];  Service: Cardiovascular;  Laterality: N/A;   CARDIOVERSION N/A 07/22/2017   Procedure: Cardioversion;  Surgeon: Minna Merritts, MD;  Location: [facility];  Service: Cardiovascular;  Laterality: N/A;   CATARACT  EXTRACTION W/PHACO Right 09/21/2016   Procedure: CATARACT EXTRACTION PHACO AND INTRAOCULAR LENS PLACEMENT ([place]);  Surgeon: Birder Robson, MD;  Location: [facility];  Service: Ophthalmology;  Laterality: Right;  [place] 44.1 AP% 16.5 CDE 7.30 Fluid Pack Lot #[id number]   CATARACT EXTRACTION W/PHACO Left 10/19/2016   Procedure: CATARACT EXTRACTION PHACO AND INTRAOCULAR LENS PLACEMENT (IOC);  Surgeon: Birder Robson, MD;  Location: [facility];  Service: Ophthalmology;  Laterality: Left;  [place] 53.7 AP% 19.5 CDE 10.45 Fluid pack lot # [id number]   CHOLECYSTECTOMY     COMBINED AUGMENTATION MAMMAPLASTY AND ABDOMINOPLASTY     JOINT REPLACEMENT Left 06/04/2013   left knee   KNEE ARTHROSCOPY Right 08/16/2016   Procedure: ARTHROSCOPY KNEE, tear posterior horn medial meniscus, tear anterior and posterior horns of lateral meniscus, chondromalacia of lateral compartment grade 3 patella and grade 4 medial;  Surgeon: Dereck Leep, MD;  Location: [facility];  Service: Orthopedics;  Laterality: Right;   LEFT HEART CATH AND CORONARY ANGIOGRAPHY Left 07/27/2019   Procedure: LEFT HEART CATH AND CORONARY ANGIOGRAPHY;  Surgeon: Wellington Hampshire, MD;  Location: [facility];  Service: Cardiovascular;  Laterality: Left;   MASTECTOMY  1986   nipple sparing mastectomy/Bilateral with silicone  breast implants, s/p saline replacements   Multiple orthopedic procedures     NOSE SURGERY     TEE WITHOUT CARDIOVERSION N/A 09/26/2017   Procedure: TRANSESOPHAGEAL ECHOCARDIOGRAM (TEE);  Surgeon: Fay Records, MD;  Location[facility];  Service: Cardiovascular;  Laterality: N/A;   TOTAL KNEE ARTHROPLASTY Left      Current Outpatient Medications  Medication Sig Dispense Refill   acetaminophen (TYLENOL) 500 MG tablet Take 1,000 mg by mouth every 6 (six) hours as needed (pain).      aluminum-magnesium hydroxide 200-200 MG/5ML suspension Take 20 mLs by mouth every 6 (six) hours as needed for indigestion.       Carboxymethylcellul-Glycerin (LUBRICATING EYE DROPS OP) Place 1 drop into both eyes daily as needed (dry eyes).     chlorhexidine (PERIDEX) 0.12 % solution Use as directed in the mouth or throat.     cholecalciferol (VITAMIN D) 1000 units tablet Take 1,000 Units by mouth daily.     Coenzyme Q10 (COQ10) 200 MG CAPS Take 200 mg by mouth daily.     diltiazem (CARDIZEM CD) 180 MG 24 hr capsule Take 1 capsule (180 mg total) by mouth every evening. 90 capsule 0   ELIQUIS 5 MG TABS tablet Take 1 tablet (5 mg total) by mouth 2 (two) times daily. 60 tablet 6   metoprolol tartrate (LOPRESSOR) 50 MG tablet Take 1 tablet (50 mg total) by mouth in the morning, at noon, and at bedtime. 270 tablet 0   pantoprazole (PROTONIX) 40 MG tablet Take 1 tablet (40 mg total) by mouth daily. 30 tablet 6   pravastatin (PRAVACHOL)  20 MG tablet Take 1 tablet (20 mg total) by mouth daily. 90 tablet 2   Probiotic Product (PROBIOTIC ADVANCED PO) Take 1 tablet by mouth daily.     sodium chloride (OCEAN) 0.65 % SOLN nasal spray Place 1 spray into both nostrils daily as needed for congestion.     No current facility-administered medications for this visit.    Allergies:   Iodine, Amiodarone, Biaxin [clarithromycin], Codeine, Digoxin and related, Enalapril, Famotidine, Fluocinonide, Iodinated diagnostic agents, Losartan potassium, Oxycodone, Promethazine, Sertraline, Tizanidine hcl, Warfarin and related, and Xarelto [rivaroxaban]    Social History:  The patient  reports that she has never smoked. She has never used smokeless tobacco. She reports that she does not drink alcohol and does not use drugs.   Family History:  The patient's family history includes Breast cancer in her maternal grandmother; Breast cancer (age of onset: 81) in her sister; Breast cancer (age of onset: 74) in her sister; Breast cancer (age of onset: 48) in her sister; Diabetes in her brother; Esophageal cancer in her brother; Heart disease in her mother and  son; Kidney failure in her brother; Stomach cancer in her father.    ROS:  Please see the history of present illness.   Otherwise, review of systems are positive for none.   All other systems are reviewed and negative.    PHYSICAL EXAM: VS:  BP 138/90 (BP Location: Left Arm, Patient Position: Sitting, Cuff Size: Normal)   Pulse 96   Ht 5\' 2"  (1.575 m)   Wt 157 lb 4 oz (71.3 kg)   SpO2 98%   BMI 28.76 kg/m  , BMI Body mass index is 28.76 kg/m. GEN: Well nourished, well developed, in no acute distress  HEENT: Left temporal lobe tenderness Neck: no JVD, carotid bruits, or masses Cardiac: Irregularly irregular; no murmurs, rubs, or gallops,no edema  Respiratory:  clear to auscultation bilaterally, normal work of breathing GI: soft, nontender, nondistended, + BS MS: no deformity or atrophy  Skin: warm and dry, no rash Neuro:  Strength and sensation are intact Psych: euthymic mood, full affect   EKG:  EKG is ordered today. The ekg ordered today demonstrates atrial fibrillation with right bundle branch block and nonspecific T wave changes.   Recent Labs: 09/09/2021: ALT 19; BUN 13; Creatinine, Ser 0.76; Hemoglobin 13.9; Platelets 293.0; Potassium 4.5; Sodium 133; TSH 0.53    Lipid Panel    Component Value Date/Time   CHOL 155 09/09/2021 1205   TRIG 85.0 09/09/2021 1205   HDL 65.20 09/09/2021 1205   CHOLHDL 2 09/09/2021 1205   VLDL 17.0 09/09/2021 1205   LDLCALC 72 09/09/2021 1205      Wt Readings from Last 3 Encounters:  10/15/21 157 lb 4 oz (71.3 kg)  09/09/21 158 lb 3.2 oz (71.8 kg)  07/13/21 164 lb 12.8 oz (74.8 kg)       ASSESSMENT AND PLAN:  1.  Permanent atrial fibrillation: Some fluctuation in her heart rate with increased palpitations especially when she does not take the third dose of metoprolol or if she takes 25 mg.  I instructed her to take metoprolol 50 mg 3 times daily in addition to diltiazem.  Continue anticoagulation with Eliquis.    2. Essential  hypertension: Blood pressure is reasonably controlled.  3. History of tachycardia-induced cardiomyopathy.  Most recent echocardiogram in February showed an EF of 55 to 60% with mild pulmonary hypertension.  4. Left carotid calcifications: Carotid Doppler in August of 2018 showed mild nonobstructive disease.  5.  Chest pain:   Cardiac catheterization multiple times so showed no evidence of coronary artery disease.  Some of her symptoms are not the setting of tachycardia.   Disposition:   FU with me in 4 months    Signed, [name], MD 10/15/21 [place], [place]

## 2021-10-15 NOTE — Patient Instructions (Signed)
Medication Instructions:  No changes at this time.  *If you need a refill on your cardiac medications before your next appointment, please call your pharmacy*   Lab Work: None  If you have labs (blood work) drawn today and your tests are completely normal, you will receive your results only by: [place] (if you have MyChart) OR A paper copy in the mail If you have any lab test that is abnormal or we need to change your treatment, we will call you to review the results.   Testing/Procedures: None   Follow-Up: At [facility], you and your health needs are our priority.  As part of our continuing mission to provide you with exceptional heart care, we have created designated Provider Care Teams.  These Care Teams include your primary Cardiologist (physician) and Advanced Practice Providers (APPs -  Physician Assistants and Nurse Practitioners) who all work together to provide you with the care you need, when you need it.  We recommend signing up for the patient portal called "MyChart".  Sign up information is provided on this After Visit Summary.  MyChart is used to connect with patients for Virtual Visits (Telemedicine).  Patients are able to view lab/test results, encounter notes, upcoming appointments, etc.  Non-urgent messages can be sent to your provider as well.   To learn more about what you can do with MyChart, go to [email or web address].    Your next appointment:   Keep scheduled appointment on 02/11/2022 at 9:20 am  The format for your next appointment:   In Person  Provider:   [name], MD

## 2021-10-23 ENCOUNTER — Ambulatory Visit: Payer: Medicare Other | Admitting: Cardiovascular Disease

## 2021-11-16 ENCOUNTER — Other Ambulatory Visit: Payer: Self-pay | Admitting: Cardiovascular Disease

## 2021-12-10 ENCOUNTER — Ambulatory Visit: Payer: Medicare Other | Admitting: Cardiovascular Disease

## 2021-12-16 ENCOUNTER — Other Ambulatory Visit: Payer: Self-pay | Admitting: Cardiovascular Disease

## 2021-12-17 NOTE — Telephone Encounter (Signed)
*  STAT* If patient is at the pharmacy, call can be transferred to refill team.   1. Which medications need to be refilled? (please list name of each medication and dose if known) Protonix  2. Which pharmacy/location (including street and city if local pharmacy) is medication to be sent to? [facility]  3. Do they need a 30 day or 90 day supply? [place]

## 2021-12-22 ENCOUNTER — Other Ambulatory Visit: Payer: Self-pay

## 2021-12-22 MED ORDER — PANTOPRAZOLE SODIUM 40 MG PO TBEC: 40.0000 mg | DELAYED_RELEASE_TABLET | Freq: Every day | ORAL | 1 refills | Status: DC

## 2022-01-14 DIAGNOSIS — H26492 Other secondary cataract, left eye: Secondary | ICD-10-CM | POA: Diagnosis not present

## 2022-01-21 ENCOUNTER — Ambulatory Visit: Payer: Medicare Other | Admitting: Dermatology

## 2022-01-21 ENCOUNTER — Encounter: Payer: Self-pay | Admitting: Dermatology

## 2022-01-21 ENCOUNTER — Other Ambulatory Visit: Payer: Self-pay

## 2022-01-21 DIAGNOSIS — L814 Other melanin hyperpigmentation: Secondary | ICD-10-CM

## 2022-01-21 DIAGNOSIS — L821 Other seborrheic keratosis: Secondary | ICD-10-CM | POA: Diagnosis not present

## 2022-01-21 DIAGNOSIS — Z85828 Personal history of other malignant neoplasm of skin: Secondary | ICD-10-CM

## 2022-01-21 DIAGNOSIS — Z1283 Encounter for screening for malignant neoplasm of skin: Secondary | ICD-10-CM | POA: Diagnosis not present

## 2022-01-21 DIAGNOSIS — L570 Actinic keratosis: Secondary | ICD-10-CM

## 2022-01-21 DIAGNOSIS — L578 Other skin changes due to chronic exposure to nonionizing radiation: Secondary | ICD-10-CM | POA: Diagnosis not present

## 2022-01-21 DIAGNOSIS — D229 Melanocytic nevi, unspecified: Secondary | ICD-10-CM | POA: Diagnosis not present

## 2022-01-21 DIAGNOSIS — D1800 Hemangioma unspecified site: Secondary | ICD-10-CM

## 2022-01-21 DIAGNOSIS — L820 Inflamed seborrheic keratosis: Secondary | ICD-10-CM | POA: Diagnosis not present

## 2022-01-21 NOTE — Patient Instructions (Signed)

## 2022-01-21 NOTE — Progress Notes (Signed)
Follow-Up Visit   Subjective  [name] [name] is a 86 y.o. female who presents for the following: check spots (Face, back, irritating, some sore). The patient has spots, moles and lesions to be evaluated, some may be new or changing and the patient has concerns that these could be cancer.  Patient accompanied by husband who contributes to history.  The following portions of the chart were reviewed this encounter and updated as appropriate:   Tobacco   Allergies   Meds   Problems   Med Hx   Surg Hx   Fam Hx      Review of Systems:  No other skin or systemic complaints except as noted in HPI or Assessment and Plan.  Objective  Well appearing patient in no apparent distress; mood and affect are within normal limits.  A focused examination was performed including face, back, arms. Relevant physical exam findings are noted in the Assessment and Plan.  L medial cheek x 1, L forehead x 2, back x 8 (11) Stuck on waxy paps with erythema   L brow x 1 Pink scaly macules    Assessment & Plan  Inflamed seborrheic keratosis (11) L medial cheek x 1, L forehead x 2, back x 8  Destruction of lesion - L medial cheek x 1, L forehead x 2, back x 8 Complexity: simple   Destruction method: cryotherapy   Informed consent: discussed and consent obtained   Timeout:  patient name, date of birth, surgical site, and procedure verified Lesion destroyed using liquid nitrogen: Yes   Region frozen until ice ball extended beyond lesion: Yes   Outcome: patient tolerated procedure well with no complications   Post-procedure details: wound care instructions given    AK (actinic keratosis) L brow x 1  Destruction of lesion - L brow x 1 Complexity: simple   Destruction method: cryotherapy   Informed consent: discussed and consent obtained   Timeout:  patient name, date of birth, surgical site, and procedure verified Lesion destroyed using liquid nitrogen: Yes   Region frozen until ice ball extended  beyond lesion: Yes   Outcome: patient tolerated procedure well with no complications   Post-procedure details: wound care instructions given    Seborrheic Keratoses - Stuck-on, waxy, tan-brown papules and/or plaques  - Benign-appearing - Discussed benign etiology and prognosis. - Observe - Call for any changes  Hemangiomas - Red papules - Discussed benign nature - Observe - Call for any changes - face, back  Lentigines - Scattered tan macules - Due to sun exposure - Benign-appering, observe - Recommend daily broad spectrum sunscreen SPF 30+ to sun-exposed areas, reapply every 2 hours as needed. - Call for any changes  Actinic Damage - chronic, secondary to cumulative UV radiation exposure/sun exposure over time - diffuse scaly erythematous macules with underlying dyspigmentation - Recommend daily broad spectrum sunscreen SPF 30+ to sun-exposed areas, reapply every 2 hours as needed.  - Recommend staying in the shade or wearing long sleeves, sun glasses (UVA+UVB protection) and wide brim hats (4-inch brim around the entire circumference of the hat). - Call for new or changing lesions.   History of Squamous Cell Carcinoma of the Skin - No evidence of recurrence today - No lymphadenopathy - Recommend regular full body skin exams - Recommend daily broad spectrum sunscreen SPF 30+ to sun-exposed areas, reapply every 2 hours as needed.  - Call if any new or changing lesions are noted between office visits - L distal post deltoid  Return in about 1 year (around 01/21/2023) for TBSE, Hx of SCC, Hx of AKs.  I, Othelia Pulling, RMA, am acting as scribe for Sarina Ser, MD . Documentation: I have reviewed the above documentation for accuracy and completeness, and I agree with the above.  Sarina Ser, MD

## 2022-01-24 ENCOUNTER — Encounter: Payer: Self-pay | Admitting: Dermatology

## 2022-01-29 ENCOUNTER — Telehealth: Payer: Self-pay | Admitting: Cardiovascular Disease

## 2022-01-29 MED ORDER — APIXABAN 5 MG PO TABS: 5.0000 mg | ORAL_TABLET | Freq: Two times a day (BID) | ORAL | 6 refills | Status: DC

## 2022-01-29 NOTE — Telephone Encounter (Signed)
°*  STAT* If patient is at the pharmacy, call can be transferred to refill team.   1. Which medications need to be refilled? (please list name of each medication and dose if known) Eliquis 5mg  1 tablet twice a day  2. Which pharmacy/location (including street and city if local pharmacy) is medication to be sent to? [facility] [place]  3. Do they need a 30 day or 90 day supply? 30 day

## 2022-01-29 NOTE — Telephone Encounter (Signed)
°*  STAT* If patient is at the pharmacy, call can be transferred to refill team.   1. Which medications need to be refilled? (please list name of each medication and dose if known) Eliquis 5 mg po BID  2. Which pharmacy/location (including street and city if local pharmacy) is medication to be sent to?[facility]   3. Do they need a 30 day or 90 day supply? [place]

## 2022-01-29 NOTE — Telephone Encounter (Signed)
Eliquis 5 mg refill request received. Patient is 86 years old, weight- 71.3 kg, Crea- 0.76 on 09/09/21, Diagnosis- afib, and last seen by Dr. Fletcher Anon on 09/09/21. Dose is appropriate based on dosing criteria. Will send in refill to requested pharmacy.

## 2022-01-29 NOTE — Telephone Encounter (Signed)
Refill request

## 2022-01-29 NOTE — Telephone Encounter (Signed)
Eliquis 5 mg refill request received. Patient is 85 years old, weight-71.3 kg, Crea- 0.76 on 09/09/21, Diagnosis- afib, and last seen by Dr. Fletcher Anon on 10/15/21. Dose is appropriate based on dosing criteria. Will send in refill to requested pharmacy.

## 2022-02-04 DIAGNOSIS — H26492 Other secondary cataract, left eye: Secondary | ICD-10-CM | POA: Diagnosis not present

## 2022-02-11 ENCOUNTER — Encounter: Payer: Self-pay | Admitting: Cardiovascular Disease

## 2022-02-11 ENCOUNTER — Other Ambulatory Visit: Payer: Self-pay

## 2022-02-11 ENCOUNTER — Ambulatory Visit: Payer: Medicare Other | Admitting: Cardiovascular Disease

## 2022-02-11 VITALS — BP 126/70 | HR 85 | Ht 63.0 in | Wt 161.1 lb

## 2022-02-11 DIAGNOSIS — E7800 Pure hypercholesterolemia, unspecified: Secondary | ICD-10-CM

## 2022-02-11 DIAGNOSIS — I6522 Occlusion and stenosis of left carotid artery: Secondary | ICD-10-CM

## 2022-02-11 DIAGNOSIS — I4821 Permanent atrial fibrillation: Secondary | ICD-10-CM

## 2022-02-11 DIAGNOSIS — I10 Essential (primary) hypertension: Secondary | ICD-10-CM | POA: Diagnosis not present

## 2022-02-11 DIAGNOSIS — Z8679 Personal history of other diseases of the circulatory system: Secondary | ICD-10-CM | POA: Diagnosis not present

## 2022-02-11 MED ORDER — PRAVASTATIN SODIUM 20 MG PO TABS: 20.0000 mg | ORAL_TABLET | Freq: Every day | ORAL | 2 refills | Status: DC

## 2022-02-11 NOTE — Progress Notes (Signed)
Cardiology Office Note   Date:  02/11/2022   ID:  [name] [name], DOB Nov 19, 1936, MRN [id number]  PCP:  [name], MD  Cardiologist:   [name], MD   Chief Complaint  Patient presents with   Other    C/o unable to sleep and headaches. Meds reviewed verbally with pt.       History of Present Illness: [name] [name] is a 86 y.o. female who presents for a followup visit regarding permanent atrial fibrillation.   She has known history of  atrial fibrillation/flutter status post multiple cardioversions in the past but currently with chronic atrial fibrillation being treated with rate control.  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    Most recent ischemic cardiac evaluation was in July 2020 which included a Lexiscan Myoview which showed possible inferolateral ischemia with normal ejection fraction.  Cardiac catheterization was followed in August which showed minimal irregularities with no evidence of obstructive coronary artery disease, normal ejection fraction mildly elevated left ventricular end-diastolic pressure.  Most recent echocardiogram in February 2022 showed normal LV systolic function with an EF of 55 to 60% with mild to moderate pulmonary hypertension and estimated systolic pulmonary pressure of 45 mmHg.  She has been doing very well with no recent chest pain, shortness of breath or palpitations.  She complains of mild headache.  No issues with anticoagulation.  Blood pressure has been controlled.  Past Medical History:  Diagnosis Date   Actinic keratosis    Arthritis    Atrial flutter ([place]) 02/2011   s/p cardioversion    Chest pain    a. H/o cardiac cath x 2-> 2012 -->nl cors;  b. 12/2016 MV: EF 61%, small region of mild perfusion defect in the apical anteroseptal region c/w breast attenuation, no ischemia-->Low risk; c. 06/2019 MV: Mod size, mild inflat ischemia, EF 59%. Cor and Ao Ca2+. Inflat defect more pronounced  on this study compared to last; c. 07/2019 Cath: LM nl, LAD min irregs, LCX nl, OM1/2/3 nl, RCA min irregs.   CKD (chronic kidney disease), stage III ([place])    Concussion with no loss of consciousness 09/01/2016   Cystocele    Degenerative disorder of bone    GERD (gastroesophageal reflux disease)    Headache(784.0)    chronic   Hiatal hernia    Hyperlipidemia    Hypertension    Influenza 01/10/2017   Knee fracture    Migraines    Mobitz type 2 second degree atrioventricular block    a. felt to be 2/2 amiodarone, resolved with decreased amiodarone dose.  Amio since d/c'd.   Permanent atrial fibrillation (HCC)    a. status post multiple DCCVs; b. 2018 - eval for PVI but opted for rate control.   PONV (postoperative nausea and vomiting)    oxycodone and codiene cause N/V    Pre-syncope    a. In setting of dehydration and AKI in the past.   Sleep apnea    Squamous cell carcinoma of skin 11/10/2020   left distal posterior deltoid (EDC 01/15/2021)   Tachycardia induced cardiomyopathy ([place])    a. Resolved;  b. 08/2017 Echo: EF 50-55%, no rwma, mild MR, mildly to mod dil LA/RA; c. 02/2018 Echo: EF 55-60%, mild MR. Mildly dil LA. Nl RVSP. PASP 78mmHg.   Venous insufficiency    Vertigo     Past Surgical History:  Procedure Laterality Date   ABDOMINAL HYSTERECTOMY  1990   APPENDECTOMY  AUGMENTATION MAMMAPLASTY Bilateral 1986   implants   AUGMENTATION MAMMAPLASTY  1990   AUGMENTATION MAMMAPLASTY  2011   CARDIAC CATHETERIZATION     CARDIOVERSION     x 3   CARDIOVERSION     CARDIOVERSION N/A 02/07/2017   Procedure: CARDIOVERSION;  Surgeon: Wellington Hampshire, MD;  Location: [facility];  Service: Cardiovascular;  Laterality: N/A;   CARDIOVERSION N/A 07/22/2017   Procedure: Cardioversion;  Surgeon: Minna Merritts, MD;  Location: [facility];  Service: Cardiovascular;  Laterality: N/A;   CATARACT EXTRACTION W/PHACO Right 09/21/2016   Procedure: CATARACT EXTRACTION PHACO AND INTRAOCULAR LENS  PLACEMENT ([place]);  Surgeon: Birder Robson, MD;  Location: [facility];  Service: Ophthalmology;  Laterality: Right;  [place] 44.1 AP% 16.5 CDE 7.30 Fluid Pack Lot #[id number]   CATARACT EXTRACTION W/PHACO Left 10/19/2016   Procedure: CATARACT EXTRACTION PHACO AND INTRAOCULAR LENS PLACEMENT (IOC);  Surgeon: Birder Robson, MD;  Location: [facility];  Service: Ophthalmology;  Laterality: Left;  [place] 53.7 AP% 19.5 CDE 10.45 Fluid pack lot # [id number]   CHOLECYSTECTOMY     COMBINED AUGMENTATION MAMMAPLASTY AND ABDOMINOPLASTY     JOINT REPLACEMENT Left 06/04/2013   left knee   KNEE ARTHROSCOPY Right 08/16/2016   Procedure: ARTHROSCOPY KNEE, tear posterior horn medial meniscus, tear anterior and posterior horns of lateral meniscus, chondromalacia of lateral compartment grade 3 patella and grade 4 medial;  Surgeon: Dereck Leep, MD;  Location: [facility];  Service: Orthopedics;  Laterality: Right;   LEFT HEART CATH AND CORONARY ANGIOGRAPHY Left 07/27/2019   Procedure: LEFT HEART CATH AND CORONARY ANGIOGRAPHY;  Surgeon: Wellington Hampshire, MD;  Location: [place] [facility];  Service: Cardiovascular;  Laterality: Left;   MASTECTOMY  1986   nipple sparing mastectomy/Bilateral with silicone  breast implants, s/p saline replacements   Multiple orthopedic procedures     NOSE SURGERY     TEE WITHOUT CARDIOVERSION N/A 09/26/2017   Procedure: TRANSESOPHAGEAL ECHOCARDIOGRAM (TEE);  Surgeon: Fay Records, MD;  Location: [facility];  Service: Cardiovascular;  Laterality: N/A;   TOTAL KNEE ARTHROPLASTY Left      Current Outpatient Medications  Medication Sig Dispense Refill   acetaminophen (TYLENOL) 500 MG tablet Take 1,000 mg by mouth every 6 (six) hours as needed (pain).      aluminum-magnesium hydroxide 200-200 MG/5ML suspension Take 20 mLs by mouth every 6 (six) hours as needed for indigestion.      apixaban (ELIQUIS) 5 MG TABS tablet Take 1 tablet (5 mg total) by mouth 2 (two) times daily. 60 tablet 6    Carboxymethylcellul-Glycerin (LUBRICATING EYE DROPS OP) Place 1 drop into both eyes daily as needed (dry eyes).     chlorhexidine (PERIDEX) 0.12 % solution Use as directed in the mouth or throat.     cholecalciferol (VITAMIN D) 1000 units tablet Take 1,000 Units by mouth daily.     Coenzyme Q10 (COQ10) 200 MG CAPS Take 200 mg by mouth daily.     diltiazem (CARDIZEM CD) 180 MG 24 hr capsule Take 1 capsule (180 mg total) by mouth every evening. 90 capsule 0   metoprolol tartrate (LOPRESSOR) 50 MG tablet Take 1 tablet (50 mg total) by mouth in the morning, at noon, and at bedtime. 270 tablet 3   pantoprazole (PROTONIX) 40 MG tablet Take 1 tablet (40 mg total) by mouth daily. 90 tablet 1   pravastatin (PRAVACHOL) 20 MG tablet Take 1 tablet (20 mg total) by mouth daily. 90 tablet 2   Probiotic Product (  PROBIOTIC ADVANCED PO) Take 1 tablet by mouth daily.     sodium chloride (OCEAN) 0.65 % SOLN nasal spray Place 1 spray into both nostrils daily as needed for congestion.     No current facility-administered medications for this visit.    Allergies:   Iodine, Amiodarone, Biaxin [clarithromycin], Codeine, Digoxin and related, Enalapril, Famotidine, Fluocinonide, Iodinated contrast media, Losartan potassium, Oxycodone, Promethazine, Sertraline, Tizanidine hcl, Warfarin and related, and Xarelto [rivaroxaban]    Social History:  The patient  reports that she has never smoked. She has never used smokeless tobacco. She reports that she does not drink alcohol and does not use drugs.   Family History:  The patient's family history includes Breast cancer in her maternal grandmother; Breast cancer (age of onset: 85) in her sister; Breast cancer (age of onset: 55) in her sister; Breast cancer (age of onset: 74) in her sister; Diabetes in her brother; Esophageal cancer in her brother; Heart disease in her mother and son; Kidney failure in her brother; Stomach cancer in her father.    ROS:  Please see the history  of present illness.   Otherwise, review of systems are positive for none.   All other systems are reviewed and negative.    PHYSICAL EXAM: VS:  BP 126/70 (BP Location: Right Arm, Patient Position: Sitting, Cuff Size: Normal)    Pulse 85    Ht 5\' 3"  (1.6 m)    Wt 161 lb 2 oz (73.1 kg)    SpO2 98%    BMI 28.54 kg/m  , BMI Body mass index is 28.54 kg/m. GEN: Well nourished, well developed, in no acute distress  HEENT: Left temporal lobe tenderness Neck: no JVD, carotid bruits, or masses Cardiac: Irregularly irregular; no murmurs, rubs, or gallops,no edema  Respiratory:  clear to auscultation bilaterally, normal work of breathing GI: soft, nontender, nondistended, + BS MS: no deformity or atrophy  Skin: warm and dry, no rash Neuro:  Strength and sensation are intact Psych: euthymic mood, full affect   EKG:  EKG is ordered today. The ekg ordered today demonstrates coarse atrial fibrillation with right bundle branch block.  Ventricular rate is 85 bpm.  Recent Labs: 09/09/2021: ALT 19; BUN 13; Creatinine, Ser 0.76; Hemoglobin 13.9; Platelets 293.0; Potassium 4.5; Sodium 133; TSH 0.53    Lipid Panel    Component Value Date/Time   CHOL 155 09/09/2021 1205   TRIG 85.0 09/09/2021 1205   HDL 65.20 09/09/2021 1205   CHOLHDL 2 09/09/2021 1205   VLDL 17.0 09/09/2021 1205   LDLCALC 72 09/09/2021 1205      Wt Readings from Last 3 Encounters:  02/11/22 161 lb 2 oz (73.1 kg)  10/15/21 157 lb 4 oz (71.3 kg)  09/09/21 158 lb 3.2 oz (71.8 kg)       ASSESSMENT AND PLAN:  1.  Permanent atrial fibrillation: Ventricular rate is well controlled on metoprolol and diltiazem.   Continue anticoagulation with Eliquis.  I reviewed most recent labs in September which showed normal renal function and CBC.  2. Essential hypertension: Blood pressure is well controlled.  3. History of tachycardia-induced cardiomyopathy.  Most recent echocardiogram last year showed an EF of 55 to 60% with mild pulmonary  hypertension.  4. Left carotid calcifications: Carotid Doppler in August of 2018 showed mild nonobstructive disease.  5.  Hyperlipidemia: Most recent lipid profile in September showed an LDL of 72.  I refilled pravastatin today.   Disposition:   FU with me in 6 months  Signed, [name], MD 02/11/22 [place], [place]

## 2022-02-11 NOTE — Patient Instructions (Signed)

## 2022-02-12 NOTE — Addendum Note (Signed)
Addended by: Britt Bottom on: 02/12/2022 08:27 AM   Modules accepted: Orders

## 2022-02-16 ENCOUNTER — Other Ambulatory Visit: Payer: Self-pay

## 2022-02-16 ENCOUNTER — Telehealth: Payer: Self-pay | Admitting: Cardiovascular Disease

## 2022-02-16 MED ORDER — DILTIAZEM HCL ER COATED BEADS 180 MG PO CP24: ORAL_CAPSULE | ORAL | 1 refills | Status: DC

## 2022-02-16 NOTE — Telephone Encounter (Signed)
°*  STAT* If patient is at the pharmacy, call can be transferred to refill team.   1. Which medications need to be refilled? (please list name of each medication and dose if known) Diltiazem 180 mg  2. Which pharmacy/location (including street and city if local pharmacy) is medication to be sent to? [facility]  3. Do they need a 30 day or 90 day supply? 90 day supply

## 2022-02-16 NOTE — Telephone Encounter (Signed)
*  STAT* If patient is at the pharmacy, call can be transferred to refill team.   1. Which medications need to be refilled? (please list name of each medication and dose if known) Valsartan  2. Which pharmacy/location (including street and city if local pharmacy) is medication to be sent to? [facility]  3. Do they need a 30 day or 90 day supply? [place]

## 2022-02-16 NOTE — Telephone Encounter (Signed)
Requested Prescriptions   Signed Prescriptions Disp Refills   diltiazem (CARDIZEM CD) 180 MG 24 hr capsule 90 capsule 1    Sig: Take 1 capsule (180 mg total) by mouth every evening.    Authorizing Provider: [name] A    Ordering User: Raelene Bott, [place] Day L

## 2022-02-18 ENCOUNTER — Telehealth: Payer: Self-pay | Admitting: Internal Medicine

## 2022-02-18 NOTE — Telephone Encounter (Signed)
Pt called in stating she was cleaning out the car on yesterday and she hit her head on the seat of the truck. Pt stated her head was sore late that afternoon and she woke up with a headache. Pt wants to know if we do head xrays at the office. sent to access nurse ?

## 2022-02-18 NOTE — Telephone Encounter (Signed)
LMTCB to follow up with pt and see if she was still going to go to the ED after her husbands appt  ?

## 2022-02-19 NOTE — Telephone Encounter (Signed)
Spoke with pt and she stated that she went to UC in front of [employer] and they told her that if she had a concussion she would have been throwing up. Pt stated that she is better headache is gone and there is no swelling. Pt stated that if she would have thought it was any thing worse she would have went to the ED.  ?

## 2022-03-31 ENCOUNTER — Telehealth: Payer: Self-pay | Admitting: Internal Medicine

## 2022-03-31 NOTE — Telephone Encounter (Signed)
Attempted to schedule AWV. Unable to LVM.  Will try at later time.  

## 2022-04-14 ENCOUNTER — Ambulatory Visit: Payer: Medicare Other | Admitting: Primary Care

## 2022-04-14 ENCOUNTER — Ambulatory Visit (INDEPENDENT_AMBULATORY_CARE_PROVIDER_SITE_OTHER): Payer: Medicare Other | Admitting: Internal Medicine

## 2022-04-14 ENCOUNTER — Encounter: Payer: Self-pay | Admitting: Internal Medicine

## 2022-04-14 VITALS — Ht 63.0 in | Wt 161.0 lb

## 2022-04-14 DIAGNOSIS — R5381 Other malaise: Secondary | ICD-10-CM | POA: Diagnosis not present

## 2022-04-14 DIAGNOSIS — R0602 Shortness of breath: Secondary | ICD-10-CM | POA: Diagnosis not present

## 2022-04-14 NOTE — Assessment & Plan Note (Signed)
She is emphatic about the "soreness in her chest" not being exertional or anginal and is not interested in going to urgent Care or ER. .  Given her concurrent respiratory and sinus symptoms, will obtain CBC, BNP,  Chest x ray tomorrow and treat as indicated ?

## 2022-04-14 NOTE — Progress Notes (Signed)
Virtual Visit converted to Telephone  Note ? ?This visit type was conducted due to national recommendations for restrictions regarding the COVID-19 pandemic (e.g. social distancing).  This format is felt to be most appropriate for this patient at this time.  All issues noted in this document were discussed and addressed.  No physical exam was performed (except for noted visual exam findings with Video Visits).  ? ?I connected withNAME@ on 04/14/22 at  4:45 PM EDT by  telephone and verified that I am speaking with the correct person using two identifiers. ?Location patient: home ?Location provider: work or home office ?Persons participating in the virtual visit: patient, provider ? ?I discussed the limitations, risks, security and privacy concerns of performing an evaluation and management service by telephone and the availability of in person appointments. I also discussed with the patient that there may be a patient responsible charge related to this service. The patient expressed understanding and agreed to proceed. ? ?Interactive audio and video telecommunications were attempted between this provider and patient, however failed, due to patient having technical difficulties OR patient did not have access to video capability.  We continued and completed visit with audio only.  ? ?Reason for visit: ? ?HPI: ? ?86 yr old female with tachycardia induced cardiomyopathy presents with several days of sinus congestion, rhinorrhea,  malaise  work up this morning with headache and felt "sore in the chest" at rest associated with mild cough and chest tightness .  She denies fevers . Pulse has been in the 90's , usually in the 80's .  Denies diaphoresis,  radiation to jaw, exertional pain and lower extremity swelling. Wanted to be seen today but "called around and nobody had any openings. "     She is  worried about pneumonia  and is requesting a chest x ray  ? ? ? ?ROS: See pertinent positives and negatives per HPI. ? ?Past  Medical History:  ?Diagnosis Date  ? Actinic keratosis   ? Arthritis   ? Atrial flutter ([place]) 02/2011  ? s/p cardioversion   ? Chest pain   ? a. H/o cardiac cath x 2-> 2012 -->nl cors;  b. 12/2016 MV: EF 61%, small region of mild perfusion defect in the apical anteroseptal region c/w breast attenuation, no ischemia-->Low risk; c. 06/2019 MV: Mod size, mild inflat ischemia, EF 59%. Cor and Ao Ca2+. Inflat defect more pronounced on this study compared to last; c. 07/2019 Cath: LM nl, LAD min irregs, LCX nl, OM1/2/3 nl, RCA min irregs.  ? CKD (chronic kidney disease), stage III ([place])   ? Concussion with no loss of consciousness 09/01/2016  ? Cystocele   ? Degenerative disorder of bone   ? GERD (gastroesophageal reflux disease)   ? Headache(784.0)   ? chronic  ? Hiatal hernia   ? Hyperlipidemia   ? Hypertension   ? Influenza 01/10/2017  ? Knee fracture   ? Migraines   ? Mobitz type 2 second degree atrioventricular block   ? a. felt to be 2/2 amiodarone, resolved with decreased amiodarone dose.  Amio since d/c'd.  ? Permanent atrial fibrillation ([place])   ? a. status post multiple DCCVs; b. 2018 - eval for PVI but opted for rate control.  ? PONV (postoperative nausea and vomiting)   ? oxycodone and codiene cause N/V   ? Pre-syncope   ? a. In setting of dehydration and AKI in the past.  ? Sleep apnea   ? Squamous cell carcinoma of skin 11/10/2020  ?  left distal posterior deltoid (EDC 01/15/2021)  ? Tachycardia induced cardiomyopathy ([place])   ? a. Resolved;  b. 08/2017 Echo: EF 50-55%, no rwma, mild MR, mildly to mod dil LA/RA; c. 02/2018 Echo: EF 55-60%, mild MR. Mildly dil LA. Nl RVSP. PASP 8mHg.  ? Venous insufficiency   ? Vertigo   ? ? ?Past Surgical History:  ?Procedure Laterality Date  ? ABDOMINAL HYSTERECTOMY  1990  ? APPENDECTOMY    ? AUGMENTATION MAMMAPLASTY Bilateral 1986  ? implants  ? [place] ? AUGMENTATION MAMMAPLASTY  2011  ? CARDIAC CATHETERIZATION    ? CARDIOVERSION    ? x 3  ?  CARDIOVERSION    ? CARDIOVERSION N/A 02/07/2017  ? Procedure: CARDIOVERSION;  Surgeon: MWellington Hampshire MD;  Location: [facility];  Service: Cardiovascular;  Laterality: N/A;  ? CARDIOVERSION N/A 07/22/2017  ? Procedure: Cardioversion;  Surgeon: GMinna Merritts MD;  Location: [facility];  Service: Cardiovascular;  Laterality: N/A;  ? CATARACT EXTRACTION W/PHACO Right 09/21/2016  ? Procedure: CATARACT EXTRACTION PHACO AND INTRAOCULAR LENS PLACEMENT (IOC);  Surgeon: WBirder Robson MD;  Location: [facility];  Service: Ophthalmology;  Laterality: Right;  [place]44.1 ?AP% 16.5 ?CDE 7.30 ?Fluid Pack Lot ##[id number]  ? CATARACT EXTRACTION W/PHACO Left 10/19/2016  ? Procedure: CATARACT EXTRACTION PHACO AND INTRAOCULAR LENS PLACEMENT (I[facility]);  Surgeon: WBirder Robson MD;  Location: [facility];  Service: Ophthalmology;  Laterality: Left;  [place]53.7 ?AP% 19.5 ?CDE 10.45 ?Fluid pack lot # [id number]  ? CHOLECYSTECTOMY    ? COMBINED AUGMENTATION MAMMAPLASTY AND ABDOMINOPLASTY    ? JOINT REPLACEMENT Left 06/04/2013  ? left knee  ? KNEE ARTHROSCOPY Right 08/16/2016  ? Procedure: ARTHROSCOPY KNEE, tear posterior horn medial meniscus, tear anterior and posterior horns of lateral meniscus, chondromalacia of lateral compartment grade 3 patella and grade 4 medial;  Surgeon: JDereck Leep MD;  Location: [facility];  Service: Orthopedics;  Laterality: Right;  ? LEFT HEART CATH AND CORONARY ANGIOGRAPHY Left 07/27/2019  ? Procedure: LEFT HEART CATH AND CORONARY ANGIOGRAPHY;  Surgeon: AWellington Hampshire MD;  [facility];  Service: Cardiovascular;  Laterality: Left;  ? MASTECTOMY  1986  ? nipple sparing mastectomy/Bilateral with silicone  breast implants, s/p saline replacements  ? Multiple orthopedic procedures    ? NOSE SURGERY    ? TEE WITHOUT CARDIOVERSION N/A 09/26/2017  ? Procedure: TRANSESOPHAGEAL ECHOCARDIOGRAM (TEE);  Surgeon: RFay Records MD;  [facility]  Service: Cardiovascular;  Laterality: N/A;  ? TOTAL KNEE  ARTHROPLASTY Left   ? ? ?Family History  ?Problem Relation Age of Onset  ? Heart disease Mother   ? Stomach cancer Father   ? Heart disease Son   ?     found at autopsy  ? Breast cancer Sister 548 ? Breast cancer Maternal Grandmother   ? Breast cancer Sister 622 ? Diabetes Brother   ? Esophageal cancer Brother   ? Kidney failure Brother   ? Breast cancer Sister [phone] ? Ovarian cancer Neg Hx   ? ? ?SOCIAL HX:  reports that she has never smoked. She has never used smokeless tobacco. She reports that she does not drink alcohol and does not use drugs.  ? ? ?Current Outpatient Medications:  ?  acetaminophen (TYLENOL) 500 MG tablet, Take 1,000 mg by mouth every 6 (six) hours as needed (pain). , Disp: , Rfl:  ?  aluminum-magnesium hydroxide 200-200 MG/5ML suspension, Take 20 mLs by mouth every 6 (  six) hours as needed for indigestion. , Disp: , Rfl:  ?  apixaban (ELIQUIS) 5 MG TABS tablet, Take 1 tablet (5 mg total) by mouth 2 (two) times daily., Disp: 60 tablet, Rfl: 6 ?  Carboxymethylcellul-Glycerin (LUBRICATING EYE DROPS OP), Place 1 drop into both eyes daily as needed (dry eyes)., Disp: , Rfl:  ?  chlorhexidine (PERIDEX) 0.12 % solution, Use as directed in the mouth or throat., Disp: , Rfl:  ?  cholecalciferol (VITAMIN D) 1000 units tablet, Take 1,000 Units by mouth daily., Disp: , Rfl:  ?  Coenzyme Q10 (COQ10) 200 MG CAPS, Take 200 mg by mouth daily., Disp: , Rfl:  ?  diltiazem (CARDIZEM CD) 180 MG 24 hr capsule, Take 1 capsule (180 mg total) by mouth every evening., Disp: 90 capsule, Rfl: 1 ?  metoprolol tartrate (LOPRESSOR) 50 MG tablet, Take 1 tablet (50 mg total) by mouth in the morning, at noon, and at bedtime., Disp: 270 tablet, Rfl: 3 ?  pantoprazole (PROTONIX) 40 MG tablet, Take 1 tablet (40 mg total) by mouth daily., Disp: 90 tablet, Rfl: 1 ?  pravastatin (PRAVACHOL) 20 MG tablet, Take 1 tablet (20 mg total) by mouth daily., Disp: 90 tablet, Rfl: 2 ?  Probiotic Product (PROBIOTIC ADVANCED PO), Take 1 tablet by  mouth daily., Disp: , Rfl:  ?  sodium chloride (OCEAN) 0.65 % SOLN nasal spray, Place 1 spray into both nostrils daily as needed for congestion., Disp: , Rfl:  ? ?EXAM: ? ? ?General impression: alert, cooperative and a

## 2022-04-15 ENCOUNTER — Ambulatory Visit (INDEPENDENT_AMBULATORY_CARE_PROVIDER_SITE_OTHER): Payer: Medicare Other

## 2022-04-15 ENCOUNTER — Other Ambulatory Visit (INDEPENDENT_AMBULATORY_CARE_PROVIDER_SITE_OTHER): Payer: Medicare Other

## 2022-04-15 DIAGNOSIS — R0602 Shortness of breath: Secondary | ICD-10-CM | POA: Diagnosis not present

## 2022-04-15 DIAGNOSIS — E871 Hypo-osmolality and hyponatremia: Secondary | ICD-10-CM

## 2022-04-15 DIAGNOSIS — R5381 Other malaise: Secondary | ICD-10-CM | POA: Diagnosis not present

## 2022-04-15 LAB — CBC WITH DIFFERENTIAL/PLATELET
Basophils Absolute: 0.1 10*3/uL (ref 0.0–0.1)
Basophils Relative: 0.8 % (ref 0.0–3.0)
Eosinophils Absolute: 0.1 10*3/uL (ref 0.0–0.7)
Eosinophils Relative: 0.8 % (ref 0.0–5.0)
HCT: 40.8 % (ref 36.0–46.0)
Hemoglobin: 13.4 g/dL (ref 12.0–15.0)
Lymphocytes Relative: 10.8 % — ABNORMAL LOW (ref 12.0–46.0)
Lymphs Abs: 0.7 10*3/uL (ref 0.7–4.0)
MCHC: 32.8 g/dL (ref 30.0–36.0)
MCV: 96.8 fl (ref 78.0–100.0)
Monocytes Absolute: 0.4 10*3/uL (ref 0.1–1.0)
Monocytes Relative: 5.8 % (ref 3.0–12.0)
Neutro Abs: 5.6 10*3/uL (ref 1.4–7.7)
Neutrophils Relative %: 81.8 % — ABNORMAL HIGH (ref 43.0–77.0)
Platelets: 274 10*3/uL (ref 150.0–400.0)
RBC: 4.21 Mil/uL (ref 3.87–5.11)
RDW: 13.7 % (ref 11.5–15.5)
WBC: 6.8 10*3/uL (ref 4.0–10.5)

## 2022-04-15 LAB — BASIC METABOLIC PANEL
BUN: 14 mg/dL (ref 6–23)
CO2: 30 mEq/L (ref 19–32)
Calcium: 9.3 mg/dL (ref 8.4–10.5)
Chloride: 97 mEq/L (ref 96–112)
Creatinine, Ser: 0.81 mg/dL (ref 0.40–1.20)
GFR: 66.74 mL/min (ref >60.00)
Glucose, Bld: 131 mg/dL — ABNORMAL HIGH (ref 70–99)
Potassium: 4.4 mEq/L (ref 3.5–5.1)
Sodium: 132 mEq/L — ABNORMAL LOW (ref 135–145)

## 2022-04-15 LAB — BRAIN NATRIURETIC PEPTIDE: Pro B Natriuretic peptide (BNP): 159 pg/mL — ABNORMAL HIGH (ref 0.0–100.0)

## 2022-04-23 ENCOUNTER — Other Ambulatory Visit: Payer: Self-pay | Admitting: Internal Medicine

## 2022-04-30 DIAGNOSIS — R296 Repeated falls: Secondary | ICD-10-CM | POA: Diagnosis not present

## 2022-04-30 DIAGNOSIS — S81812A Laceration without foreign body, left lower leg, initial encounter: Secondary | ICD-10-CM | POA: Diagnosis not present

## 2022-05-06 ENCOUNTER — Ambulatory Visit (INDEPENDENT_AMBULATORY_CARE_PROVIDER_SITE_OTHER): Payer: Medicare Other

## 2022-05-06 ENCOUNTER — Telehealth: Payer: Self-pay

## 2022-05-06 VITALS — BP 104/61 | HR 92 | Ht 63.0 in | Wt 161.0 lb

## 2022-05-06 DIAGNOSIS — Z0000 Encounter for general adult medical examination without abnormal findings: Secondary | ICD-10-CM

## 2022-05-06 NOTE — Telephone Encounter (Signed)
No answer when called for scheduled AWV. No answer. Okay to reschedule.

## 2022-05-06 NOTE — Progress Notes (Signed)
Subjective:   [name] [name] is a 86 y.o. female who presents for Medicare Annual (Subsequent) preventive examination.  Review of Systems    No ROS.  Medicare Wellness Virtual Visit.  Visual/audio telehealth visit, UTA vital signs.   See social history for additional risk factors.   Cardiac Risk Factors include: advanced age (>31mn, >>24women);hypertension     Objective:    Today's Vitals   05/06/22 1601  BP: 104/61  Pulse: 92  Weight: 161 lb (73 kg)  Height: '5\' 3"'$  (1.6 m)   Body mass index is 28.52 kg/m.     05/06/2022    4:07 PM 08/31/2020   11:59 AM 04/26/2020    7:36 AM 03/24/2020   10:48 AM 01/09/2020    1:45 PM 07/27/2019    7:37 AM 02/12/2019   10:52 AM  Advanced Directives  Does Patient Have a Medical Advance Directive? Yes Yes No No No No Yes  Type of [place] will       Does patient want to make changes to medical advance directive? No - Patient declined        Copy of [place] Chart? No - copy requested        Would patient like information on creating a medical advance directive?      No - Patient declined     Current Medications (verified) Outpatient Encounter Medications as of 05/06/2022  Medication Sig   acetaminophen (TYLENOL) 500 MG tablet Take 1,000 mg by mouth every 6 (six) hours as needed (pain).    aluminum-magnesium hydroxide 200-200 MG/5ML suspension Take 20 mLs by mouth every 6 (six) hours as needed for indigestion.    apixaban (ELIQUIS) 5 MG TABS tablet Take 1 tablet (5 mg total) by mouth 2 (two) times daily.   Carboxymethylcellul-Glycerin (LUBRICATING EYE DROPS OP) Place 1 drop into both eyes daily as needed (dry eyes).   chlorhexidine (PERIDEX) 0.12 % solution Use as directed in the mouth or throat.   cholecalciferol (VITAMIN D) 1000 units tablet Take 1,000 Units by mouth daily.   Coenzyme Q10 (COQ10) 200 MG CAPS Take 200 mg by mouth  daily.   diltiazem (CARDIZEM CD) 180 MG 24 hr capsule Take 1 capsule (180 mg total) by mouth every evening.   metoprolol tartrate (LOPRESSOR) 50 MG tablet Take 1 tablet (50 mg total) by mouth in the morning, at noon, and at bedtime.   pantoprazole (PROTONIX) 40 MG tablet TAKE 1 TABLET BY MOUTH DAILY   pravastatin (PRAVACHOL) 20 MG tablet Take 1 tablet (20 mg total) by mouth daily.   Probiotic Product (PROBIOTIC ADVANCED PO) Take 1 tablet by mouth daily.   sodium chloride (OCEAN) 0.65 % SOLN nasal spray Place 1 spray into both nostrils daily as needed for congestion.   No facility-administered encounter medications on file as of 05/06/2022.    Allergies (verified) Iodine, Amiodarone, Biaxin [clarithromycin], Codeine, Digoxin and related, Enalapril, Famotidine, Fluocinonide, Iodinated contrast media, Losartan potassium, Oxycodone, Promethazine, Sertraline, Tizanidine hcl, Warfarin and related, and Xarelto [rivaroxaban]   History: Past Medical History:  Diagnosis Date   Actinic keratosis    Arthritis    [place]rial fl[place] 02/2011   s/p cardioversion    Chest pain    a. H/o cardiac cath x 2-> 2012 -->nl cors;  b. 12/2016 MV: EF 61%, small region of mild perfusion defect in the apical anteroseptal region c/w breast attenuation, no ischemia-->Low risk; c.  06/2019 MV: Mod size, mild inflat ischemia, EF 59%. Cor and Ao Ca2+. Inflat defect more pronounced on this study compared to last; c. 07/2019 Cath: LM nl, LAD min irregs, LCX nl, OM1/2/3 nl, RCA min irregs.   CKD (chronic kidney disease), stage III ([place])    Concussion with no loss of consciousness 09/01/2016   Cystocele    Degenerative disorder of bone    GERD (gastroesophageal reflux disease)    Headache(784.0)    chronic   Hiatal hernia    Hyperlipidemia    Hypertension    Influenza 01/10/2017   Knee fracture    Migraines    Mobitz type 2 second degree atrioventricular block    a. felt to be 2/2 amiodarone, resolved with decreased  amiodarone dose.  Amio since d/c'd.   Permanent atrial fibrillation (HCC)    a. status post multiple DCCVs; b. 2018 - eval for PVI but opted for rate control.   PONV (postoperative nausea and vomiting)    oxycodone and codiene cause N/V    Pre-syncope    a. In setting of dehydration and AKI in the past.   Sleep apnea    Squamous cell carcinoma of skin 11/10/2020   left distal posterior deltoid (EDC 01/15/2021)   Tachycardia induced cardiomyopathy ([place])    a. Resolved;  b. 08/2017 Echo: EF 50-55%, no rwma, mild MR, mildly to mod dil LA/RA; c. 02/2018 Echo: EF 55-60%, mild MR. Mildly dil LA. Nl RVSP. PASP 46mHg.   Venous insufficiency    Vertigo    Past Surgical History:  Procedure Laterality Date   ABDOMINAL HYSTERECTOMY  1990   APPENDECTOMY     AUGMENTATION MAMMAPLASTY Bilateral 1986   implants   AUGMENTATION MAMMAPLASTY  1990   AUGMENTATION MAMMAPLASTY  2011   CARDIAC CATHETERIZATION     CARDIOVERSION     x 3   CARDIOVERSION     CARDIOVERSION N/A 02/07/2017   Procedure: CARDIOVERSION;  Surgeon: MWellington Hampshire MD;  Location: [facility] OR[facility];  Service: Cardiovascular;  Laterality: N/A;   CARDIOVERSION N/A 07/22/2017   Procedure: Cardioversion;  Surgeon: GMinna Merritts MD;  Location: [facility];  Service: Cardiovascular;  Laterality: N/A;   CATARACT EXTRACTION W/PHACO Right 09/21/2016   Procedure: CATARACT EXTRACTION PHACO AND INTRAOCULAR LENS PLACEMENT ([place];  Surgeon: WBirder Robson MD;  Location: [facility];  Service: Ophthalmology;  Laterality: Right;  [place]44.[place] AP% 16.5 CDE 7.30 Fluid Pack Lot ##[id number]   CATARACT EXTRACTION W/PHACO Left 10/19/2016   Procedure: CATARACT EXTRACTION PHACO AND INTRAOCULAR LENS PLACEMENT (IOC);  Surgeon: WBirder Robson MD;  Location: [facility];  Service: Ophthalmology;  Laterality: Left;  [place]53.7 AP% 19.5 CDE 10.45 Fluid pack lot # [id number]   CHOLECYSTECTOMY     COMBINED AUGMENTATION MAMMAPLASTY AND ABDOMINOPLASTY     JOINT REPLACEMENT Left  06/04/2013   left knee   KNEE ARTHROSCOPY Right 08/16/2016   Procedure: ARTHROSCOPY KNEE, tear posterior horn medial meniscus, tear anterior and posterior horns of lateral meniscus, chondromalacia of lateral compartment grade 3 patella and grade 4 medial;  Surgeon: JDereck Leep MD;  Location: [facility];  Service: Orthopedics;  Laterality: Right;   LEFT HEART CATH AND CORONARY ANGIOGRAPHY Left 07/27/2019   Procedure: LEFT HEART CATH AND CORONARY ANGIOGRAPHY;  Surgeon: AWellington Hampshire MD;  [facility];  Service: Cardiovascular;  Laterality: Left;   MASTECTOMY  1986   nipple sparing mastectomy/Bilateral with silicone  breast implants, s/p saline replacements   Multiple orthopedic procedures  NOSE SURGERY     TEE WITHOUT CARDIOVERSION N/A 09/26/2017   Procedure: TRANSESOPHAGEAL ECHOCARDIOGRAM (TEE);  Surgeon: Fay Records, MD;  Location: [facility];  Service: Cardiovascular;  Laterality: N/A;   TOTAL KNEE ARTHROPLASTY Left    Family History  Problem Relation Age of Onset   Heart disease Mother    Stomach cancer Father    Heart disease Son        found at autopsy   Breast cancer Sister 96   Breast cancer Maternal Grandmother    Breast cancer Sister 48   Diabetes Brother    Esophageal cancer Brother    Kidney failure Brother    Breast cancer Sister 19   Ovarian cancer Neg Hx    Social History   Socioeconomic History   Marital status: Married    Spouse name: Not on file   Number of children: 1   Years of education: Not on file   Highest education level: Not on file  Occupational History    Employer: RETIRED  Tobacco Use   Smoking status: Never   Smokeless tobacco: Never  Vaping Use   Vaping Use: Never used  Substance and Sexual Activity   Alcohol use: No   Drug use: No   Sexual activity: Never    Birth control/protection: Surgical  Other Topics Concern   Not on file  Social History Narrative   Married    Social Determinants of Health    Financial Resource Strain: Low Risk    Difficulty of Paying Living Expenses: Not hard at all  Food Insecurity: No Food Insecurity   Worried About Charity fundraiser in the Last Year: Never true   Arboriculturist in the Last Year: Never true  Transportation Needs: No Transportation Needs   Lack of Transportation (Medical): No   Lack of Transportation (Non-Medical): No  Physical Activity: Insufficiently Active   Days of Exercise per Week: 3 days   Minutes of Exercise per Session: 20 min  Stress: No Stress Concern Present   Feeling of Stress : Not at all  Social Connections: Unknown   Frequency of Communication with Friends and Family: Not on file   Frequency of Social Gatherings with Friends and Family: Not on file   Attends Religious Services: Not on Electrical engineer or Organizations: Not on file   Attends Archivist Meetings: Not on file   Marital Status: Married    Tobacco Counseling Counseling given: Not Answered   Clinical Intake:  Pre-visit preparation completed: Yes        Diabetes: No  How often do you need to have someone help you when you read instructions, pamphlets, or other written materials from your doctor or pharmacy?: 1 - Never    Interpreter Needed?: No    Activities of Daily Living    05/06/2022    4:08 PM  In your present state of health, do you have any difficulty performing the following activities:  Hearing? 0  Vision? 0  Difficulty concentrating or making decisions? 0  Walking or climbing stairs? 0  Dressing or bathing? 0  Doing errands, shopping? 0  Preparing Food and eating ? N  Using the Toilet? N  In the past six months, have you accidently leaked urine? N  Do you have problems with loss of bowel control? N  Managing your Medications? N  Managing your Finances? N  Housekeeping or managing your Housekeeping? N    Patient Care  Team: [name], MD as PCP - General (Internal Medicine) Wellington Hampshire, MD as PCP - Cardiology (Cardiology) Wellington Hampshire, MD as Consulting Physician (Cardiology)  Indicate any recent Medical Services you may have received from other than [facility] providers in the past year (date may be approximate).     Assessment:   This is a routine wellness examination for [place].  Virtual Visit via Telephone Note  I connected with  [name] on 05/06/22 at  3:15 PM EDT by telephone and verified that I am speaking with the correct person using two identifiers.  Persons participating in the virtual visit: patient/Nurse Health Advisor   I discussed the limitations of performing an evaluation and management service by telehealth. We continued and completed visit with audio only. Some vital signs may be absent or patient reported.   Hearing/Vision screen Hearing Screening - Comments:: Patient is able to hear conversational tones without difficulty.  No issues reported. Vision Screening - Comments:: Followed by [facility]  Wears corrective lenses for reading  Cataract extraction, bilateral They have regular follow up with the ophthalmologist  Dietary issues and exercise activities discussed: Current Exercise Habits: Home exercise routine, Intensity: Mild   Goals Addressed             This Visit's Progress    Increase water intake   On track    Stay hydrated and drink plenty of fluids/water  Fill cup and finish all water when taking medications      Maintain healthy lifestyle       Stay active Healthy diet       Depression Screen    05/06/2022    4:06 PM 09/09/2021   11:16 AM 06/03/2021    8:47 AM 05/06/2021   10:38 AM 12/31/2019    8:16 AM 03/22/2017    9:29 AM 03/18/2016    9:59 AM  PHQ 2/9 Scores  PHQ - 2 Score 0 0 1 0 0 0 0  PHQ- 9 Score   4  0      Fall Risk    05/06/2022    3:33 PM 09/09/2021   11:15 AM 06/03/2021    8:45 AM 05/06/2021   10:38 AM 02/04/2021    1:29 PM  Fall Risk   Falls in the past year? 1 0 0 0 0   Number falls in past yr: 1 0  0   Injury with Fall? 1 0  0   Comment Sought medical care at ED. Hit leg on wall and fell.      Follow up Falls evaluation completed  Falls evaluation completed Falls evaluation completed Falls evaluation completed    [place]: Home free of loose throw rugs in walkways, pet beds, electrical cords, etc? Yes  Adequate lighting in your home to reduce risk of falls? Yes   ASSISTIVE DEVICES UTILIZED TO PREVENT FALLS: Life alert? No  Use of a cane, walker or w/c? No   TIMED UP AND GO: Was the test performed? No .   Cognitive Function: Patient is alert and oriented x3.     03/22/2017    9:38 AM 03/18/2016   10:09 AM  MMSE - Mini Mental State Exam  Orientation to time 5 5  Orientation to Place 5 5  Registration 3 3  Attention/ Calculation 5 5  Recall 3 3  Language- name 2 objects 2 2  Language- repeat 1 1  Language- follow 3 step  command 3 3  Language- read & follow direction 1 1  Write a sentence 1 1  Copy design 1 1  Total score 30 30        05/06/2022    4:15 PM  6CIT Screen  What Year? 0 points  What month? 0 points  What time? 0 points  Count back from 20 0 points    Immunizations Immunization History  Administered Date(s) Administered   Influenza Split 10/03/2012   Influenza, High Dose Seasonal PF 10/25/2018   Influenza,inj,quad, With Preservative 10/08/2019   Influenza-Unspecified 10/09/2014, 10/01/2015, 10/06/2016, 10/01/2017, 10/15/2020, 10/20/2021   PFIZER(Purple Top)SARS-COV-2 Vaccination 01/14/2020, 02/04/2020, 11/18/2020   Pneumococcal Conjugate-13 12/11/2014   Pneumococcal Polysaccharide-23 11/08/2003, 11/07/2013   Td 06/22/2008, 11/30/2014   Tdap 11/30/2014   Zoster, Live 04/24/2011   Shingrix Completed?: No.    Education has been provided regarding the importance of this vaccine. Patient has been advised to call insurance company to determine out of pocket expense if they have not yet  received this vaccine. Advised may also receive vaccine at local pharmacy or Health Dept. Verbalized acceptance and understanding.  Screening Tests Health Maintenance  Topic Date Due   COVID-19 Vaccine (4 - Booster for Pfizer series) 05/22/2022 (Originally 01/13/2021)   Zoster Vaccines- Shingrix (1 of 2) 08/06/2022 (Originally 01/17/1955)   INFLUENZA VACCINE  07/20/2022   TETANUS/TDAP  11/30/2024   Pneumonia Vaccine 35+ Years old  Completed   DEXA SCAN  Completed   HPV VACCINES  Aged Out   Health Maintenance There are no preventive care reminders to display for this patient.  Lung Cancer Screening: (Low Dose CT Chest recommended if Age 84-80 years, 30 pack-year currently smoking OR have quit w/in 15years.) does not qualify.   Hepatitis C Screening: does not qualify.  Vision Screening: Recommended annual ophthalmology exams for early detection of glaucoma and other disorders of the eye.  Dental Screening: Recommended annual dental exams for proper oral hygiene  Community Resource Referral / Chronic Care Management: CRR required this visit?  No   CCM required this visit?  No      Plan:   Keep all routine maintenance appointments.   I have personally reviewed and noted the following in the patient's chart:   Medical and social history Use of alcohol, tobacco or illicit drugs  Current medications and supplements including opioid prescriptions.  Functional ability and status Nutritional status Physical activity Advanced directives List of other physicians Hospitalizations, surgeries, and ER visits in previous 12 months Vitals Screenings to include cognitive, depression, and falls Referrals and appointments  In addition, I have reviewed and discussed with patient certain preventive protocols, quality metrics, and best practice recommendations. A written personalized care plan for preventive services as well as general preventive health recommendations were provided to  patient.     Varney Biles, LPN   [id number]

## 2022-05-06 NOTE — Patient Instructions (Addendum)
  [name] [name] , Thank you for taking time to come for your Medicare Wellness Visit. I appreciate your ongoing commitment to your health goals. Please review the following plan we discussed and let me know if I can assist you in the future.   These are the goals we discussed:  Goals      Increase water intake     Stay hydrated and drink plenty of fluids/water  Fill cup and finish all water when taking medications      Maintain healthy lifestyle     Stay active Healthy diet        This is a list of the screening recommended for you and due dates:  Health Maintenance  Topic Date Due   COVID-19 Vaccine (4 - Booster for Pfizer series) 05/22/2022*   Zoster (Shingles) Vaccine (1 of 2) 08/06/2022*   Flu Shot  07/20/2022   Tetanus Vaccine  11/30/2024   Pneumonia Vaccine  Completed   DEXA scan (bone density measurement)  Completed   HPV Vaccine  Aged Out  *Topic was postponed. The date shown is not the original due date.

## 2022-05-10 ENCOUNTER — Ambulatory Visit (INDEPENDENT_AMBULATORY_CARE_PROVIDER_SITE_OTHER): Payer: Medicare Other | Admitting: Dermatology

## 2022-05-10 ENCOUNTER — Encounter: Payer: Self-pay | Admitting: Dermatology

## 2022-05-10 DIAGNOSIS — L821 Other seborrheic keratosis: Secondary | ICD-10-CM | POA: Diagnosis not present

## 2022-05-10 DIAGNOSIS — L820 Inflamed seborrheic keratosis: Secondary | ICD-10-CM

## 2022-05-10 DIAGNOSIS — L578 Other skin changes due to chronic exposure to nonionizing radiation: Secondary | ICD-10-CM | POA: Diagnosis not present

## 2022-05-10 NOTE — Progress Notes (Signed)
   Follow-Up Visit   Subjective  [name] [name] is a 86 y.o. female who presents for the following: lesions/moles (Face and scalp. Raised, rough, irritated, growing. Dur: several months). The patient has spots, moles and lesions to be evaluated, some may be new or changing and the patient has concerns that these could be cancer.  The following portions of the chart were reviewed this encounter and updated as appropriate:  Tobacco  Allergies  Meds  Problems  Med Hx  Surg Hx  Fam Hx     Review of Systems: No other skin or systemic complaints except as noted in HPI or Assessment and Plan.  Objective  Well appearing patient in no apparent distress; mood and affect are within normal limits.  A focused examination was performed including head, including the scalp, face, neck, nose, ears, eyelids, and lips. Relevant physical exam findings are noted in the Assessment and Plan.  left side burn x1, right crown scalp x1 (2) Erythematous keratotic or waxy stuck-on papule or plaque.   Assessment & Plan   Actinic Damage - chronic, secondary to cumulative UV radiation exposure/sun exposure over time - diffuse scaly erythematous macules with underlying dyspigmentation - Recommend daily broad spectrum sunscreen SPF 30+ to sun-exposed areas, reapply every 2 hours as needed.  - Recommend staying in the shade or wearing long sleeves, sun glasses (UVA+UVB protection) and wide brim hats (4-inch brim around the entire circumference of the hat). - Call for new or changing lesions.   Seborrheic Keratoses - Stuck-on, waxy, tan-brown papules and/or plaques  - Benign-appearing - Discussed benign etiology and prognosis. - Observe - Call for any changes  Inflamed seborrheic keratosis (2) left side burn x1, right crown scalp x1  Destruction of lesion - left side burn x1, right crown scalp x1 Complexity: simple   Destruction method: cryotherapy   Informed consent: discussed and consent obtained    Timeout:  patient name, date of birth, surgical site, and procedure verified Lesion destroyed using liquid nitrogen: Yes   Region frozen until ice ball extended beyond lesion: Yes   Outcome: patient tolerated procedure well with no complications   Post-procedure details: wound care instructions given     Return in about 6 weeks (around 06/21/2022) for ISK Follow Up.  I, Emelia Salisbury, CMA, am acting as scribe for Sarina Ser, MD. Documentation: I have reviewed the above documentation for accuracy and completeness, and I agree with the above.  Sarina Ser, MD

## 2022-05-10 NOTE — Patient Instructions (Signed)
Cryotherapy Aftercare  Wash gently with soap and water everyday.   Apply Vaseline daily until healed.   Prior to procedure, discussed risks of blister formation, small wound, skin dyspigmentation, or rare scar following cryotherapy. Recommend Vaseline ointment to treated areas while healing.    Recommend daily broad spectrum sunscreen SPF 30+ to sun-exposed areas, reapply every 2 hours as needed. Call for new or changing lesions.  Staying in the shade or wearing long sleeves, sun glasses (UVA+UVB protection) and wide brim hats (4-inch brim around the entire circumference of the hat) are also recommended for sun protection.     If You Need Anything After Your Visit  If you have any questions or concerns for your doctor, please call our main line at [phone] and press option 4 to reach your doctor's medical assistant. If no one answers, please leave a voicemail as directed and we will return your call as soon as possible. Messages left after 4 pm will be answered the following business day.   You may also send us a message via MyChart. We typically respond to MyChart messages within 1-2 business days.  For prescription refills, please ask your pharmacy to contact our office. Our fax number is [phone].  If you have an urgent issue when the clinic is closed that cannot wait until the next business day, you can page your doctor at the number below.    Please note that while we do our best to be available for urgent issues outside of office hours, we are not available 24/7.   If you have an urgent issue and are unable to reach us, you may choose to seek medical care at your doctor's office, retail clinic, urgent care center, or emergency room.  If you have a medical emergency, please immediately call 911 or go to the emergency department.  Pager Numbers  - Dr. Kowalski: [phone]  - Dr. Moye: [phone]  - Dr. Stewart: [phone]  In the event of inclement weather,  please call our main line at [phone] for an update on the status of any delays or closures.  Dermatology Medication Tips: Please keep the boxes that topical medications come in in order to help keep track of the instructions about where and how to use these. Pharmacies typically print the medication instructions only on the boxes and not directly on the medication tubes.   If your medication is too expensive, please contact our office at [phone] option 4 or send us a message through MyChart.   We are unable to tell what your co-pay for medications will be in advance as this is different depending on your insurance coverage. However, we may be able to find a substitute medication at lower cost or fill out paperwork to get insurance to cover a needed medication.   If a prior authorization is required to get your medication covered by your insurance company, please allow us 1-2 business days to complete this process.  Drug prices often vary depending on where the prescription is filled and some pharmacies may offer cheaper prices.  The website www.goodrx.com contains coupons for medications through different pharmacies. The prices here do not account for what the cost may be with help from insurance (it may be cheaper with your insurance), but the website can give you the price if you did not use any insurance.  - You can print the associated coupon and take it with your prescription to the pharmacy.  - You may also stop by our office during   regular business hours and pick up a GoodRx coupon card.  - If you need your prescription sent electronically to a different pharmacy, notify our office through [facility] MyChart or by phone at [phone] option 4.     Si Usted Necesita Algo Despus de Su Visita  Tambin puede enviarnos un mensaje a travs de MyChart. Por lo general respondemos a los mensajes de MyChart en el transcurso de 1 a 2 das hbiles.  Para renovar recetas, por favor  pida a su farmacia que se ponga en contacto con nuestra oficina. Nuestro nmero de fax es el [phone].  Si tiene un asunto urgente cuando la clnica est cerrada y que no puede esperar hasta el siguiente da hbil, puede llamar/localizar a su doctor(a) al nmero que aparece a continuacin.   Por favor, tenga en cuenta que aunque hacemos todo lo posible para estar disponibles para asuntos urgentes fuera del horario de oficina, no estamos disponibles las 24 horas del da, los 7 das de la semana.   Si tiene un problema urgente y no puede comunicarse con nosotros, puede optar por buscar atencin mdica  en el consultorio de su doctor(a), en una clnica privada, en un centro de atencin urgente o en una sala de emergencias.  Si tiene una emergencia mdica, por favor llame inmediatamente al 911 o vaya a la sala de emergencias.  Nmeros de bper  - Dr. Kowalski: [phone]  - Dra. Moye: [phone]  - Dra. Stewart: [phone]  En caso de inclemencias del tiempo, por favor llame a nuestra lnea principal al [phone] para una actualizacin sobre el estado de cualquier retraso o cierre.  Consejos para la medicacin en dermatologa: Por favor, guarde las cajas en las que vienen los medicamentos de uso tpico para ayudarle a seguir las instrucciones sobre dnde y cmo usarlos. Las farmacias generalmente imprimen las instrucciones del medicamento slo en las cajas y no directamente en los tubos del medicamento.   Si su medicamento es muy caro, por favor, pngase en contacto con nuestra oficina llamando al [phone] y presione la opcin 4 o envenos un mensaje a travs de MyChart.   No podemos decirle cul ser su copago por los medicamentos por adelantado ya que esto es diferente dependiendo de la cobertura de su seguro. Sin embargo, es posible que podamos encontrar un medicamento sustituto a menor costo o llenar un formulario para que el seguro cubra el medicamento que se considera  necesario.   Si se requiere una autorizacin previa para que su compaa de seguros cubra su medicamento, por favor permtanos de 1 a 2 das hbiles para completar este proceso.  Los precios de los medicamentos varan con frecuencia dependiendo del lugar de dnde se surte la receta y alguna farmacias pueden ofrecer precios ms baratos.  El sitio web www.goodrx.com tiene cupones para medicamentos de diferentes farmacias. Los precios aqu no tienen en cuenta lo que podra costar con la ayuda del seguro (puede ser ms barato con su seguro), pero el sitio web puede darle el precio si no utiliz ningn seguro.  - Puede imprimir el cupn correspondiente y llevarlo con su receta a la farmacia.  - Tambin puede pasar por nuestra oficina durante el horario de atencin regular y recoger una tarjeta de cupones de GoodRx.  - Si necesita que su receta se enve electrnicamente a una farmacia diferente, informe a nuestra oficina a travs de MyChart de [facility] o por telfono llamando al [phone] y presione la opcin 4.  

## 2022-05-11 ENCOUNTER — Ambulatory Visit (INDEPENDENT_AMBULATORY_CARE_PROVIDER_SITE_OTHER): Payer: Medicare Other | Admitting: Internal Medicine

## 2022-05-11 ENCOUNTER — Encounter: Payer: Self-pay | Admitting: Internal Medicine

## 2022-05-11 VITALS — BP 132/84 | HR 89 | Temp 97.6°F | Ht 63.0 in | Wt 161.6 lb

## 2022-05-11 DIAGNOSIS — S81812A Laceration without foreign body, left lower leg, initial encounter: Secondary | ICD-10-CM | POA: Insufficient documentation

## 2022-05-11 NOTE — Patient Instructions (Addendum)
Your   wound is not infected.  No more antibiotics needed  It is healing slowly because of moisture imbalance. Keep the wound covered 24/7 with a dressing.   Do not let  it  get immersed  in bath water or shower water   Clean it daily with sterile saline  and pat dry before replacing  the dressing   [facility]   Wound care referral made , but it may be resolved by then

## 2022-05-11 NOTE — Progress Notes (Signed)
Subjective:  Patient ID: [name] [name], female    DOB: [birth year]  Age: 86 y.o. MRN: [id number]  CC: The encounter diagnosis was Noninfected skin tear of leg, left, initial encounter.   HPI [name] [name] presents for  Chief Complaint  Patient presents with   Acute Visit    Skin tare on left lower leg.   Evaluation and treatment of a superficial wound on left leg that began as a skin tear 3 weeks ago . When she fell against the brick retaining wall.    She was seen by her dermatologist yesterday who deferred treatment   Treated by walk in clinic 10 days ago for same and treated by Dr Fulton Mole,  prescribed Omnicef  and xerogrm gazue  took the antibiotic for 6 days,  stools turned  a different color ("red") ,  returned to clinic and was given a xeroform dressing by the drug store.  Has been taking the patch at night to let it breathe .  Has been avoiding immersion  and shower    Outpatient Medications Prior to Visit  Medication Sig Dispense Refill   acetaminophen (TYLENOL) 500 MG tablet Take 1,000 mg by mouth every 6 (six) hours as needed (pain).      aluminum-magnesium hydroxide 200-200 MG/5ML suspension Take 20 mLs by mouth every 6 (six) hours as needed for indigestion.      apixaban (ELIQUIS) 5 MG TABS tablet Take 1 tablet (5 mg total) by mouth 2 (two) times daily. 60 tablet 6   Carboxymethylcellul-Glycerin (LUBRICATING EYE DROPS OP) Place 1 drop into both eyes daily as needed (dry eyes).     chlorhexidine (PERIDEX) 0.12 % solution Use as directed in the mouth or throat.     cholecalciferol (VITAMIN D) 1000 units tablet Take 1,000 Units by mouth daily.     Coenzyme Q10 (COQ10) 200 MG CAPS Take 200 mg by mouth daily.     diltiazem (CARDIZEM CD) 180 MG 24 hr capsule Take 1 capsule (180 mg total) by mouth every evening. 90 capsule 1   metoprolol tartrate (LOPRESSOR) 50 MG tablet Take 1 tablet (50 mg total) by mouth in the morning, at noon, and at bedtime. 270 tablet 3   pantoprazole  (PROTONIX) 40 MG tablet TAKE 1 TABLET BY MOUTH DAILY 90 tablet 1   pravastatin (PRAVACHOL) 20 MG tablet Take 1 tablet (20 mg total) by mouth daily. 90 tablet 2   Probiotic Product (PROBIOTIC ADVANCED PO) Take 1 tablet by mouth daily.     sodium chloride (OCEAN) 0.65 % SOLN nasal spray Place 1 spray into both nostrils daily as needed for congestion.     No facility-administered medications prior to visit.    Review of Systems;  Patient denies headache, fevers, malaise, unintentional weight loss, skin rash, eye pain, sinus congestion and sinus pain, sore throat, dysphagia,  hemoptysis , cough, dyspnea, wheezing, chest pain, palpitations, orthopnea, edema, abdominal pain, nausea, melena, diarrhea, constipation, flank pain, dysuria, hematuria, urinary  Frequency, nocturia, numbness, tingling, seizures,  Focal weakness, Loss of consciousness,  Tremor, insomnia, depression, anxiety, and suicidal ideation.      Objective:  BP 132/84 (BP Location: Left Arm, Patient Position: Sitting, Cuff Size: Normal)   Pulse 89   Temp 97.6 F (36.4 C) (Oral)   Ht '5\' 3"'$  (1.6 m)   Wt 161 lb 9.6 oz (73.3 kg)   SpO2 97%   BMI 28.63 kg/m   BP Readings from Last 3 Encounters:  05/11/22 132/84  05/06/22  104/61  02/11/22 126/70    Wt Readings from Last 3 Encounters:  05/11/22 161 lb 9.6 oz (73.3 kg)  05/06/22 161 lb (73 kg)  04/14/22 161 lb (73 kg)    General appearance: alert, cooperative and appears stated age Ears: normal TM's and external ear canals both ears Throat: lips, mucosa, and tongue normal; teeth and gums normal Neck: no adenopathy, no carotid bruit, supple, symmetrical, trachea midline and thyroid not enlarged, symmetric, no tenderness/mass/nodules Back: symmetric, no curvature. ROM normal. No CVA tenderness. Lungs: clear to auscultation bilaterally Heart: regular rate and rhythm, S1, S2 normal, no murmur, click, rub or gallop Abdomen: soft, non-tender; bowel sounds normal; no masses,  no  organomegaly Pulses: 2+ and symmetric Skin:   left anterior calf with half dollar sized wound,  covered in devitalized skin ,  no erythema or purulent drainage .  Venous insufficiency changes noted  Lymph nodes: Cervical, supraclavicular, and axillary nodes normal.  Lab Results  Component Value Date   HGBA1C 6.0 (A) 02/04/2021   HGBA1C 6.2 05/15/2020   HGBA1C 6.4 12/20/2019    Lab Results  Component Value Date   CREATININE 0.81 04/15/2022   CREATININE 0.76 09/09/2021   CREATININE 0.79 05/04/2021    Lab Results  Component Value Date   WBC 6.8 04/15/2022   HGB 13.4 04/15/2022   HCT 40.8 04/15/2022   PLT 274.0 04/15/2022   GLUCOSE 131 (H) 04/15/2022   CHOL 155 09/09/2021   TRIG 85.0 09/09/2021   HDL 65.20 09/09/2021   LDLCALC 72 09/09/2021   ALT 19 09/09/2021   AST 22 09/09/2021   NA 132 (L) 04/15/2022   K 4.4 04/15/2022   CL 97 04/15/2022   CREATININE 0.81 04/15/2022   BUN 14 04/15/2022   CO2 30 04/15/2022   TSH 0.53 09/09/2021   INR 1.3 02/12/2019   HGBA1C 6.0 (A) 02/04/2021   MICROALBUR 8.3 05/25/2017    MM 3D SCREEN BREAST W/IMPLANT BILATERAL  Result Date: 09/08/2021 CLINICAL DATA:  Screening. EXAM: DIGITAL SCREENING BILATERAL MAMMOGRAM WITH IMPLANTS, CAD AND TOMOSYNTHESIS TECHNIQUE: Bilateral screening digital craniocaudal and mediolateral oblique mammograms were obtained. Bilateral screening digital breast tomosynthesis was performed. The images were evaluated with computer-aided detection. Standard and/or implant displaced views were performed. COMPARISON:  Previous exam(s). ACR Breast Density Category a: The breast tissue is almost entirely fatty. FINDINGS: The patient has retropectoral implants. There are no findings suspicious for malignancy. IMPRESSION: No mammographic evidence of malignancy. A result letter of this screening mammogram will be mailed directly to the patient. RECOMMENDATION: Screening mammogram in one year. (Code:SM-B-01Y) BI-RADS CATEGORY  1:   Negative. Electronically Signed   By: Fidela Salisbury M.D.   On: 09/08/2021 15:32   Assessment & Plan:   Problem List Items Addressed This Visit     Noninfected skin tear of leg, left, initial encounter - Primary    Present for 2 weeks ,  Slow to heal due to drying out.  Wound care instructions given,  Referral placed        Relevant Orders   Ambulatory referral to Wound Clinic    Follow-up: No follow-ups on file.   [name], MD

## 2022-05-11 NOTE — Assessment & Plan Note (Signed)
Present for 2 weeks ,  Slow to heal due to drying out.  Wound care instructions given,  Referral placed

## 2022-05-12 ENCOUNTER — Telehealth: Payer: Self-pay

## 2022-05-12 NOTE — Telephone Encounter (Signed)
Patient states she saw Dr. Derrel Nip yesterday and she dressed her leg.  Patient states she discovered she needs a prescription for the sterile saline 0.9%.  Patient states she needs this to clean her leg.  Patient states she would like to know if we can call it in for her or if she can come by and pick it up.  Please call.  *Patient states her preferred pharmacy is [facility].

## 2022-05-12 NOTE — Telephone Encounter (Signed)
Patient came by and picked some up.

## 2022-05-16 ENCOUNTER — Encounter: Payer: Self-pay | Admitting: Dermatology

## 2022-05-17 ENCOUNTER — Other Ambulatory Visit: Payer: Self-pay | Admitting: Cardiovascular Disease

## 2022-05-19 ENCOUNTER — Ambulatory Visit: Payer: Medicare Other | Admitting: Dermatology

## 2022-05-24 ENCOUNTER — Ambulatory Visit: Payer: Medicare Other | Admitting: Physician Assistant

## 2022-05-26 ENCOUNTER — Ambulatory Visit (INDEPENDENT_AMBULATORY_CARE_PROVIDER_SITE_OTHER): Payer: Medicare Other | Admitting: Internal Medicine

## 2022-05-26 ENCOUNTER — Encounter: Payer: Self-pay | Admitting: Internal Medicine

## 2022-05-26 VITALS — BP 136/84 | HR 85 | Temp 97.6°F | Ht 63.0 in | Wt 160.2 lb

## 2022-05-26 DIAGNOSIS — D6859 Other primary thrombophilia: Secondary | ICD-10-CM

## 2022-05-26 DIAGNOSIS — T466X5A Adverse effect of antihyperlipidemic and antiarteriosclerotic drugs, initial encounter: Secondary | ICD-10-CM | POA: Diagnosis not present

## 2022-05-26 DIAGNOSIS — R7303 Prediabetes: Secondary | ICD-10-CM

## 2022-05-26 DIAGNOSIS — F32A Depression, unspecified: Secondary | ICD-10-CM | POA: Diagnosis not present

## 2022-05-26 DIAGNOSIS — F419 Anxiety disorder, unspecified: Secondary | ICD-10-CM

## 2022-05-26 DIAGNOSIS — S81812A Laceration without foreign body, left lower leg, initial encounter: Secondary | ICD-10-CM | POA: Diagnosis not present

## 2022-05-26 DIAGNOSIS — G720 Drug-induced myopathy: Secondary | ICD-10-CM | POA: Diagnosis not present

## 2022-05-26 DIAGNOSIS — I10 Essential (primary) hypertension: Secondary | ICD-10-CM | POA: Diagnosis not present

## 2022-05-26 DIAGNOSIS — R5383 Other fatigue: Secondary | ICD-10-CM

## 2022-05-26 DIAGNOSIS — E7800 Pure hypercholesterolemia, unspecified: Secondary | ICD-10-CM

## 2022-05-26 DIAGNOSIS — I700 Atherosclerosis of aorta: Secondary | ICD-10-CM | POA: Diagnosis not present

## 2022-05-26 LAB — COMPREHENSIVE METABOLIC PANEL
ALT: 18 U/L (ref 0–35)
AST: 22 U/L (ref 0–37)
Albumin: 4.3 g/dL (ref 3.5–5.2)
Alkaline Phosphatase: 77 U/L (ref 39–117)
BUN: 12 mg/dL (ref 6–23)
CO2: 25 mEq/L (ref 19–32)
Calcium: 9.8 mg/dL (ref 8.4–10.5)
Chloride: 99 mEq/L (ref 96–112)
Creatinine, Ser: 0.71 mg/dL (ref 0.40–1.20)
GFR: 78.12 mL/min (ref >60.00)
Glucose, Bld: 104 mg/dL — ABNORMAL HIGH (ref 70–99)
Potassium: 4.5 mEq/L (ref 3.5–5.1)
Sodium: 131 mEq/L — ABNORMAL LOW (ref 135–145)
Total Bilirubin: 1.6 mg/dL — ABNORMAL HIGH (ref 0.2–1.2)
Total Protein: 7.1 g/dL (ref 6.0–8.3)

## 2022-05-26 LAB — LIPID PANEL
Cholesterol: 146 mg/dL (ref 0–200)
HDL: 68 mg/dL (ref >39.00)
LDL Cholesterol: 67 mg/dL (ref 0–99)
NonHDL: 78.4
Total CHOL/HDL Ratio: 2
Triglycerides: 57 mg/dL (ref 0.0–149.0)
VLDL: 11.4 mg/dL (ref 0.0–40.0)

## 2022-05-26 LAB — HEMOGLOBIN A1C: Hgb A1c MFr Bld: 6.5 % (ref 4.6–6.5)

## 2022-05-26 LAB — TSH: TSH: 0.81 u[IU]/mL (ref 0.35–5.50)

## 2022-05-26 MED ORDER — ZOSTER VAC RECOMB ADJUVANTED 50 MCG/0.5ML IM SUSR: 0.5000 mL | Freq: Once | INTRAMUSCULAR | 1 refills | Status: AC

## 2022-05-26 NOTE — Progress Notes (Signed)
Subjective:  Patient ID: [name] [name], female    DOB: 11-Nov-1936  Age: 86 y.o. MRN: [id number]  CC: The primary encounter diagnosis was Essential hypertension. Diagnoses of Pure hypercholesterolemia, Prediabetes, Other fatigue, Statin myopathy, Aortic atherosclerosis ([place]), Thrombophilia ([place]), Anxiety and depression, and Noninfected skin tear of leg, left, initial encounter were also pertinent to this visit.   HPI [name] [name] presents for  Chief Complaint  Patient presents with   Follow-up    Follow up on hypertension, hyperlipidemia   1) HTN: Hypertension: patient checks blood pressure twice weekly at home.  Readings have been for the most part < 140/80 at rest . Patient is following a reduce salt diet most days and is taking medications as prescribed   2) HLD/aortic atherosclerosis:  Reviewed findings of prior CT scan today..  Patient is tolerating  low potency statin therapy  and declines change in therapy to high potency therapy due to prior epsiodes of statin myopathy with atorvastatin .    3) LLE wound : the wound s healing but large eschar has formed over the remaining surface area.  She has been keeping it covered about 75% of the time and cleaning it with saline . S  hehas deferred wound care referral appt    Outpatient Medications Prior to Visit  Medication Sig Dispense Refill   acetaminophen (TYLENOL) 500 MG tablet Take 1,000 mg by mouth every 6 (six) hours as needed (pain).      aluminum-magnesium hydroxide 200-200 MG/5ML suspension Take 20 mLs by mouth every 6 (six) hours as needed for indigestion.      apixaban (ELIQUIS) 5 MG TABS tablet Take 1 tablet (5 mg total) by mouth 2 (two) times daily. 60 tablet 6   Carboxymethylcellul-Glycerin (LUBRICATING EYE DROPS OP) Place 1 drop into both eyes daily as needed (dry eyes).     chlorhexidine (PERIDEX) 0.12 % solution Use as directed in the mouth or throat.     cholecalciferol (VITAMIN D) 1000 units tablet Take 1,000 Units  by mouth daily.     Coenzyme Q10 (COQ10) 200 MG CAPS Take 200 mg by mouth daily.     diltiazem (CARDIZEM CD) 180 MG 24 hr capsule TAKE 1 CAPSULE (180 MG) BY MOUTH EVERY EVENING 90 capsule 1   metoprolol tartrate (LOPRESSOR) 50 MG tablet Take 1 tablet (50 mg total) by mouth in the morning, at noon, and at bedtime. 270 tablet 3   pantoprazole (PROTONIX) 40 MG tablet TAKE 1 TABLET BY MOUTH DAILY 90 tablet 1   pravastatin (PRAVACHOL) 20 MG tablet Take 1 tablet (20 mg total) by mouth daily. 90 tablet 2   Probiotic Product (PROBIOTIC ADVANCED PO) Take 1 tablet by mouth daily.     sodium chloride (OCEAN) 0.65 % SOLN nasal spray Place 1 spray into both nostrils daily as needed for congestion.     No facility-administered medications prior to visit.    Review of Systems;  Patient denies headache, fevers, malaise, unintentional weight loss, skin rash, eye pain, sinus congestion and sinus pain, sore throat, dysphagia,  hemoptysis , cough, dyspnea, wheezing, chest pain, palpitations, orthopnea, edema, abdominal pain, nausea, melena, diarrhea, constipation, flank pain, dysuria, hematuria, urinary  Frequency, nocturia, numbness, tingling, seizures,  Focal weakness, Loss of consciousness,  Tremor, insomnia, depression, anxiety, and suicidal ideation.      Objective:  BP 136/84 (BP Location: Left Arm, Patient Position: Sitting, Cuff Size: Normal)   Pulse 85   Temp 97.6 F (36.4 C) (Oral)  Ht '5\' 3"'  (1.6 m)   Wt 160 lb 3.2 oz (72.7 kg)   SpO2 96%   BMI 28.38 kg/m   BP Readings from Last 3 Encounters:  05/26/22 136/84  05/11/22 132/84  05/06/22 104/61    Wt Readings from Last 3 Encounters:  05/26/22 160 lb 3.2 oz (72.7 kg)  05/11/22 161 lb 9.6 oz (73.3 kg)  05/06/22 161 lb (73 kg)    General appearance: alert, cooperative and appears stated age Ears: normal TM's and external ear canals both ears Throat: lips, mucosa, and tongue normal; teeth and gums normal Neck: no adenopathy, no carotid  bruit, supple, symmetrical, trachea midline and thyroid not enlarged, symmetric, no tenderness/mass/nodules Back: symmetric, no curvature. ROM normal. No CVA tenderness. Lungs: clear to auscultation bilaterally Heart: regular rate and rhythm, S1, S2 normal, no murmur, click, rub or gallop Abdomen: soft, non-tender; bowel sounds normal; no masses,  no organomegaly Pulses: 2+ and symmetric Skin: left lower leg with large VI wound covered in eschar without drainage or erythema  Lymph nodes: Cervical, supraclavicular, and axillary nodes normal.  Lab Results  Component Value Date   HGBA1C 6.5 05/26/2022   HGBA1C 6.0 (A) 02/04/2021   HGBA1C 6.2 05/15/2020    Lab Results  Component Value Date   CREATININE 0.71 05/26/2022   CREATININE 0.81 04/15/2022   CREATININE 0.76 09/09/2021    Lab Results  Component Value Date   WBC 6.8 04/15/2022   HGB 13.4 04/15/2022   HCT 40.8 04/15/2022   PLT 274.0 04/15/2022   GLUCOSE 104 (H) 05/26/2022   CHOL 146 05/26/2022   TRIG 57.0 05/26/2022   HDL 68.00 05/26/2022   LDLCALC 67 05/26/2022   ALT 18 05/26/2022   AST 22 05/26/2022   NA 131 (L) 05/26/2022   K 4.5 05/26/2022   CL 99 05/26/2022   CREATININE 0.71 05/26/2022   BUN 12 05/26/2022   CO2 25 05/26/2022   TSH 0.81 05/26/2022   INR 1.3 02/12/2019   HGBA1C 6.5 05/26/2022   MICROALBUR 8.3 05/25/2017    MM 3D SCREEN BREAST W/IMPLANT BILATERAL  Result Date: 09/08/2021 CLINICAL DATA:  Screening. EXAM: DIGITAL SCREENING BILATERAL MAMMOGRAM WITH IMPLANTS, CAD AND TOMOSYNTHESIS TECHNIQUE: Bilateral screening digital craniocaudal and mediolateral oblique mammograms were obtained. Bilateral screening digital breast tomosynthesis was performed. The images were evaluated with computer-aided detection. Standard and/or implant displaced views were performed. COMPARISON:  Previous exam(s). ACR Breast Density Category a: The breast tissue is almost entirely fatty. FINDINGS: The patient has retropectoral  implants. There are no findings suspicious for malignancy. IMPRESSION: No mammographic evidence of malignancy. A result letter of this screening mammogram will be mailed directly to the patient. RECOMMENDATION: Screening mammogram in one year. (Code:SM-B-01Y) BI-RADS CATEGORY  1:  Negative. Electronically Signed   By: Fidela Salisbury M.D.   On: 09/08/2021 15:32   Assessment & Plan:   Problem List Items Addressed This Visit     Anxiety and depression    Chronic anxiety largely due to worrying about her husband and other family members.  counselling given  Trial of zoloft. Offered but deferred.       Aortic atherosclerosis ([place])    Reviewed findings of prior CT scan today..  Patient is tolerating pravastatin  And last LDL was 70.  No changes today   .      Essential hypertension - Primary   Relevant Orders   Comp Met (CMET) (Completed)   Hyperlipidemia    Tolerating  Pravastatin. She has minimal aortic  atherosclerosis ,  High potency statin repeat trial is not advised by cardiology.  LDL and triglycerides are at goal on current medications. she has no side effects and liver enzymes are normal. No changes today   Lab Results  Component Value Date   CHOL 146 05/26/2022   HDL 68.00 05/26/2022   LDLCALC 67 05/26/2022   TRIG 57.0 05/26/2022   CHOLHDL 2 05/26/2022   Lab Results  Component Value Date   ALT 18 05/26/2022   AST 22 05/26/2022   ALKPHOS 77 05/26/2022   BILITOT 1.6 (H) 05/26/2022         Relevant Orders   Lipid Profile (Completed)   Noninfected skin tear of leg, left, initial encounter    Wound care outlined.  Wound is healing without signs of infection       Other fatigue   Relevant Orders   TSH (Completed)   Prediabetes   Relevant Orders   Comp Met (CMET) (Completed)   HgB A1c (Completed)   Statin myopathy    She is tolerating low p otency statin but has ha myalgias with higher potency statins. She is not interested in trial of higher potency statin  use less frequently       Thrombophilia ([place])    Secondary to chronic atrial fib  Continue eliquis. She has had no bleeding episodes and no history  of CVA        Follow-up: Return in about 6 months (around 11/25/2022).   [name], MD

## 2022-05-26 NOTE — Patient Instructions (Addendum)
Always ribse any wound  with salt water (sterile saline ) after showering    If wound has a dry scab ,  place a dab of vaseline drectly on the scab and cover  it with a dressing . Change the dressing daily    The ShingRx vaccine is now available in local pharmacies and is recommended  for all adults over 50 to prevent shingles.

## 2022-05-27 DIAGNOSIS — G720 Drug-induced myopathy: Secondary | ICD-10-CM | POA: Insufficient documentation

## 2022-05-27 NOTE — Assessment & Plan Note (Signed)
Tolerating  Pravastatin. She has minimal aortic atherosclerosis ,  High potency statin repeat trial is not advised by cardiology.  LDL and triglycerides are at goal on current medications. she has no side effects and liver enzymes are normal. No changes today   Lab Results  Component Value Date   CHOL 146 05/26/2022   HDL 68.00 05/26/2022   LDLCALC 67 05/26/2022   TRIG 57.0 05/26/2022   CHOLHDL 2 05/26/2022   Lab Results  Component Value Date   ALT 18 05/26/2022   AST 22 05/26/2022   ALKPHOS 77 05/26/2022   BILITOT 1.6 (H) 05/26/2022

## 2022-05-27 NOTE — Assessment & Plan Note (Signed)
Reviewed findings of prior CT scan today..  Patient is tolerating pravastatin  And last LDL was 70.  No changes today   .

## 2022-05-27 NOTE — Assessment & Plan Note (Signed)
Chronic anxiety largely due to worrying about her husband and other family members.  counselling given  Trial of zoloft. Offered but deferred.

## 2022-05-27 NOTE — Assessment & Plan Note (Signed)
Secondary to chronic atrial fib  Continue eliquis. She has had no bleeding episodes and no history  of CVA

## 2022-05-27 NOTE — Assessment & Plan Note (Addendum)
Wound care outlined.  Wound is healing without signs of infection

## 2022-05-27 NOTE — Assessment & Plan Note (Addendum)
She is tolerating low p otency statin but has ha myalgias with higher potency statins. She is not interested in trial of higher potency statin use less frequently

## 2022-05-30 NOTE — Progress Notes (Signed)
Your a1c is elevated and  diagnostic of diabetes; however,   You do not need medications at this time,  But  when you come in for your  next office visit  We will  Discuss how we can address this with a  lifestyle changes including diet  and regular  exercise .

## 2022-06-01 ENCOUNTER — Telehealth: Payer: Self-pay | Admitting: Internal Medicine

## 2022-06-01 NOTE — Telephone Encounter (Signed)
Pt came by and picked up copy of blood work.

## 2022-06-01 NOTE — Telephone Encounter (Signed)
Pt called wanting to pick up recent lab work

## 2022-06-03 ENCOUNTER — Ambulatory Visit: Payer: Medicare Other | Admitting: Pulmonary Disease

## 2022-06-30 ENCOUNTER — Ambulatory Visit: Payer: Medicare Other | Admitting: Dermatology

## 2022-06-30 DIAGNOSIS — L821 Other seborrheic keratosis: Secondary | ICD-10-CM

## 2022-06-30 DIAGNOSIS — Z872 Personal history of diseases of the skin and subcutaneous tissue: Secondary | ICD-10-CM

## 2022-06-30 NOTE — Progress Notes (Signed)
   Follow-Up Visit   Subjective  [name] [name] is a 86 y.o. female who presents for the following: ISK f/u (L side burn, R crown scalp).  Patient accompanied by husband.  The following portions of the chart were reviewed this encounter and updated as appropriate:   Tobacco  Allergies  Meds  Problems  Med Hx  Surg Hx  Fam Hx     Review of Systems:  No other skin or systemic complaints except as noted in HPI or Assessment and Plan.  Objective  Well appearing patient in no apparent distress; mood and affect are within normal limits.  A focused examination was performed including face, scalp, back. Relevant physical exam findings are noted in the Assessment and Plan.  back, arms Stuck-on, waxy, tan-brown papule or plaque --Discussed benign etiology and prognosis.    Assessment & Plan   History of ISKs  - L sideburn, R vertex scalp  clear  Seborrheic keratosis back, arms  Benign, observe.     Return if symptoms worsen or fail to improve.  I, Othelia Pulling, RMA, am acting as scribe for Sarina Ser, MD . Documentation: I have reviewed the above documentation for accuracy and completeness, and I agree with the above.  Sarina Ser, MD

## 2022-06-30 NOTE — Patient Instructions (Signed)
Due to recent changes in healthcare laws, you may see results of your pathology and/or laboratory studies on MyChart before the doctors have had a chance to review them. We understand that in some cases there may be results that are confusing or concerning to you. Please understand that not all results are received at the same time and often the doctors may need to interpret multiple results in order to provide you with the best plan of care or course of treatment. Therefore, we ask that you please give us 2 business days to thoroughly review all your results before contacting the office for clarification. Should we see a critical lab result, you will be contacted sooner.   If You Need Anything After Your Visit  If you have any questions or concerns for your doctor, please call our main line at [phone] and press option 4 to reach your doctor's medical assistant. If no one answers, please leave a voicemail as directed and we will return your call as soon as possible. Messages left after 4 pm will be answered the following business day.   You may also send us a message via MyChart. We typically respond to MyChart messages within 1-2 business days.  For prescription refills, please ask your pharmacy to contact our office. Our fax number is [phone].  If you have an urgent issue when the clinic is closed that cannot wait until the next business day, you can page your doctor at the number below.    Please note that while we do our best to be available for urgent issues outside of office hours, we are not available 24/7.   If you have an urgent issue and are unable to reach us, you may choose to seek medical care at your doctor's office, retail clinic, urgent care center, or emergency room.  If you have a medical emergency, please immediately call 911 or go to the emergency department.  Pager Numbers  - Dr. Kowalski: [phone]  - Dr. Moye: [phone]  - Dr. Stewart:  [phone]  In the event of inclement weather, please call our main line at [phone] for an update on the status of any delays or closures.  Dermatology Medication Tips: Please keep the boxes that topical medications come in in order to help keep track of the instructions about where and how to use these. Pharmacies typically print the medication instructions only on the boxes and not directly on the medication tubes.   If your medication is too expensive, please contact our office at [phone] option 4 or send us a message through MyChart.   We are unable to tell what your co-pay for medications will be in advance as this is different depending on your insurance coverage. However, we may be able to find a substitute medication at lower cost or fill out paperwork to get insurance to cover a needed medication.   If a prior authorization is required to get your medication covered by your insurance company, please allow us 1-2 business days to complete this process.  Drug prices often vary depending on where the prescription is filled and some pharmacies may offer cheaper prices.  The website www.goodrx.com contains coupons for medications through different pharmacies. The prices here do not account for what the cost may be with help from insurance (it may be cheaper with your insurance), but the website can give you the price if you did not use any insurance.  - You can print the associated coupon and take it with   your prescription to the pharmacy.  - You may also stop by our office during regular business hours and pick up a GoodRx coupon card.  - If you need your prescription sent electronically to a different pharmacy, notify our office through [facility] MyChart or by phone at [phone] option 4.     Si Usted Necesita Algo Despus de Su Visita  Tambin puede enviarnos un mensaje a travs de MyChart. Por lo general respondemos a los mensajes de MyChart en el transcurso de 1 a 2  das hbiles.  Para renovar recetas, por favor pida a su farmacia que se ponga en contacto con nuestra oficina. Nuestro nmero de fax es el [phone].  Si tiene un asunto urgente cuando la clnica est cerrada y que no puede esperar hasta el siguiente da hbil, puede llamar/localizar a su doctor(a) al nmero que aparece a continuacin.   Por favor, tenga en cuenta que aunque hacemos todo lo posible para estar disponibles para asuntos urgentes fuera del horario de oficina, no estamos disponibles las 24 horas del da, los 7 das de la semana.   Si tiene un problema urgente y no puede comunicarse con nosotros, puede optar por buscar atencin mdica  en el consultorio de su doctor(a), en una clnica privada, en un centro de atencin urgente o en una sala de emergencias.  Si tiene una emergencia mdica, por favor llame inmediatamente al 911 o vaya a la sala de emergencias.  Nmeros de bper  - Dr. Kowalski: [phone]  - Dra. Moye: [phone]  - Dra. Stewart: [phone]  En caso de inclemencias del tiempo, por favor llame a nuestra lnea principal al [phone] para una actualizacin sobre el estado de cualquier retraso o cierre.  Consejos para la medicacin en dermatologa: Por favor, guarde las cajas en las que vienen los medicamentos de uso tpico para ayudarle a seguir las instrucciones sobre dnde y cmo usarlos. Las farmacias generalmente imprimen las instrucciones del medicamento slo en las cajas y no directamente en los tubos del medicamento.   Si su medicamento es muy caro, por favor, pngase en contacto con nuestra oficina llamando al [phone] y presione la opcin 4 o envenos un mensaje a travs de MyChart.   No podemos decirle cul ser su copago por los medicamentos por adelantado ya que esto es diferente dependiendo de la cobertura de su seguro. Sin embargo, es posible que podamos encontrar un medicamento sustituto a menor costo o llenar un formulario para que el  seguro cubra el medicamento que se considera necesario.   Si se requiere una autorizacin previa para que su compaa de seguros cubra su medicamento, por favor permtanos de 1 a 2 das hbiles para completar este proceso.  Los precios de los medicamentos varan con frecuencia dependiendo del lugar de dnde se surte la receta y alguna farmacias pueden ofrecer precios ms baratos.  El sitio web www.goodrx.com tiene cupones para medicamentos de diferentes farmacias. Los precios aqu no tienen en cuenta lo que podra costar con la ayuda del seguro (puede ser ms barato con su seguro), pero el sitio web puede darle el precio si no utiliz ningn seguro.  - Puede imprimir el cupn correspondiente y llevarlo con su receta a la farmacia.  - Tambin puede pasar por nuestra oficina durante el horario de atencin regular y recoger una tarjeta de cupones de GoodRx.  - Si necesita que su receta se enve electrnicamente a una farmacia diferente, informe a nuestra oficina a travs de MyChart de [facility]   o por telfono llamando al [phone] y presione la opcin 4.  

## 2022-07-07 ENCOUNTER — Encounter: Payer: Self-pay | Admitting: Dermatology

## 2022-07-12 ENCOUNTER — Ambulatory Visit: Payer: Medicare Other | Admitting: Dermatology

## 2022-07-20 ENCOUNTER — Encounter: Payer: Self-pay | Admitting: Cardiovascular Disease

## 2022-07-20 ENCOUNTER — Ambulatory Visit: Payer: Medicare Other | Admitting: Cardiovascular Disease

## 2022-07-20 VITALS — BP 110/70 | HR 74 | Ht 63.0 in | Wt 159.4 lb

## 2022-07-20 DIAGNOSIS — I10 Essential (primary) hypertension: Secondary | ICD-10-CM

## 2022-07-20 DIAGNOSIS — E785 Hyperlipidemia, unspecified: Secondary | ICD-10-CM

## 2022-07-20 DIAGNOSIS — I4821 Permanent atrial fibrillation: Secondary | ICD-10-CM | POA: Diagnosis not present

## 2022-07-20 DIAGNOSIS — I6522 Occlusion and stenosis of left carotid artery: Secondary | ICD-10-CM

## 2022-07-20 DIAGNOSIS — Z8679 Personal history of other diseases of the circulatory system: Secondary | ICD-10-CM

## 2022-07-20 NOTE — Patient Instructions (Signed)

## 2022-07-20 NOTE — Progress Notes (Signed)
Cardiology Office Note   Date:  07/20/2022   ID:  [name] [name], DOB May 15, 1936, MRN [id number]  PCP:  [name], MD  Cardiologist:   [name], MD   Chief Complaint  Patient presents with   Other    6 month f/u. Meds reviewed verbally with pt. Verbally. Patient c/o chest pain at times as well as shortness of breath.        History of Present Illness: [name] [name] is a 86 y.o. female who presents for a followup visit regarding permanent atrial fibrillation.   She has known history of  atrial fibrillation/flutter status post multiple cardioversions in the past but currently with chronic atrial fibrillation being treated with rate control.  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    Most recent ischemic cardiac evaluation was in July 2020 which included a Lexiscan Myoview which showed possible inferolateral ischemia with normal ejection fraction.  Cardiac catheterization was followed in August which showed minimal irregularities with no evidence of obstructive coronary artery disease, normal ejection fraction mildly elevated left ventricular end-diastolic pressure.  Most recent echocardiogram in February 2022 showed normal LV systolic function with an EF of 55 to 60% with mild to moderate pulmonary hypertension and estimated systolic pulmonary pressure of 45 mmHg.  She has been doing reasonably well overall with no  shortness of breath or palpitations.  She has occasional sensation in the chest when her heart rate is slightly elevated but no true chest pain.   Past Medical History:  Diagnosis Date   Actinic keratosis    Arthritis    Atrial flutter ([place]) 02/2011   s/p cardioversion    Chest pain    a. H/o cardiac cath x 2-> 2012 -->nl cors;  b. 12/2016 MV: EF 61%, small region of mild perfusion defect in the apical anteroseptal region c/w breast attenuation, no ischemia-->Low risk; c. 06/2019 MV: Mod size, mild inflat ischemia,  EF 59%. Cor and Ao Ca2+. Inflat defect more pronounced on this study compared to last; c. 07/2019 Cath: LM nl, LAD min irregs, LCX nl, OM1/2/3 nl, RCA min irregs.   CKD (chronic kidney disease), stage III ([place])    Concussion with no loss of consciousness 09/01/2016   Cystocele    Degenerative disorder of bone    GERD (gastroesophageal reflux disease)    Headache(784.0)    chronic   Hiatal hernia    Hyperlipidemia    Hypertension    Influenza 01/10/2017   Knee fracture    Migraines    Mobitz type 2 second degree atrioventricular block    a. felt to be 2/2 amiodarone, resolved with decreased amiodarone dose.  Amio since d/c'd.   Permanent atrial fibrillation (HCC)    a. status post multiple DCCVs; b. 2018 - eval for PVI but opted for rate control.   PONV (postoperative nausea and vomiting)    oxycodone and codiene cause N/V    Pre-syncope    a. In setting of dehydration and AKI in the past.   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 Resolved;  b. 08/2017 Echo: EF 50-55%, no rwma, mild MR, mildly to mod dil LA/RA; c. 02/2018 Echo: EF 55-60%, mild MR. Mildly dil LA. Nl RVSP. PASP 75mHg.   Venous insufficiency    Vertigo     Past Surgical History:  Procedure  Laterality Date   ABDOMINAL HYSTERECTOMY  1990   APPENDECTOMY     AUGMENTATION MAMMAPLASTY Bilateral 1986   implants   AUGMENTATION MAMMAPLASTY  1990   AUGMENTATION MAMMAPLASTY  2011   CARDIAC CATHETERIZATION     CARDIOVERSION     x 3   CARDIOVERSION     CARDIOVERSION N/A 02/07/2017   Procedure: CARDIOVERSION;  Surgeon: Wellington Hampshire, MD;  Location: [facility];  Service: Cardiovascular;  Laterality: N/A;   CARDIOVERSION N/A 07/22/2017   Procedure: Cardioversion;  Surgeon: Minna Merritts, MD;  Location: [facility];  Service: Cardiovascular;  Laterality: N/A;   CATARACT EXTRACTION W/PHACO Right 09/21/2016    Procedure: CATARACT EXTRACTION PHACO AND INTRAOCULAR LENS PLACEMENT ([place]);  Surgeon: Birder Robson, MD;  Location: [facility];  Service: Ophthalmology;  Laterality: Right;  [place] 44.1 AP% 16.5 CDE 7.30 Fluid Pack Lot #[id number]   CATARACT EXTRACTION W/PHACO Left 10/19/2016   Procedure: CATARACT EXTRACTION PHACO AND INTRAOCULAR LENS PLACEMENT ([facility]);  Surgeon: Birder Robson, MD;  Location: [facility];  Service: Ophthalmology;  Laterality: Left;  [place] 53.7 AP% 19.5 CDE 10.45 Fluid pack lot # [id number]   CHOLECYSTECTOMY     COMBINED AUGMENTATION MAMMAPLASTY AND ABDOMINOPLASTY     JOINT REPLACEMENT Left 06/04/2013   left knee   KNEE ARTHROSCOPY Right 08/16/2016   Procedure: ARTHROSCOPY KNEE, tear posterior horn medial meniscus, tear anterior and posterior horns of lateral meniscus, chondromalacia of lateral compartment grade 3 patella and grade 4 medial;  Surgeon: Dereck Leep, MD;  Location: [facility];  Service: Orthopedics;  Laterality: Right;   LEFT HEART CATH AND CORONARY ANGIOGRAPHY Left 07/27/2019   Procedure: LEFT HEART CATH AND CORONARY ANGIOGRAPHY;  Surgeon: Wellington Hampshire, MD;  Location: [facility];  Service: Cardiovascular;  Laterality: Left;   MASTECTOMY  1986   nipple sparing mastectomy/Bilateral with silicone  breast implants, s/p saline replacements   Multiple orthopedic procedures     NOSE SURGERY     TEE WITHOUT CARDIOVERSION N/A 09/26/2017   Procedure: TRANSESOPHAGEAL ECHOCARDIOGRAM (TEE);  Surgeon: Fay Records, MD;  Location: [facility];  Service: Cardiovascular;  Laterality: N/A;   TOTAL KNEE ARTHROPLASTY Left      Current Outpatient Medications  Medication Sig Dispense Refill   acetaminophen (TYLENOL) 500 MG tablet Take 1,000 mg by mouth every 6 (six) hours as needed (pain).      aluminum-magnesium hydroxide 200-200 MG/5ML suspension Take 20 mLs by mouth every 6 (six) hours as needed for indigestion.      apixaban (ELIQUIS) 5 MG TABS tablet Take 1 tablet (5 mg  total) by mouth 2 (two) times daily. 60 tablet 6   Carboxymethylcellul-Glycerin (LUBRICATING EYE DROPS OP) Place 1 drop into both eyes daily as needed (dry eyes).     chlorhexidine (PERIDEX) 0.12 % solution Use as directed in the mouth or throat.     cholecalciferol (VITAMIN D) 1000 units tablet Take 1,000 Units by mouth daily.     Coenzyme Q10 (COQ10) 200 MG CAPS Take 200 mg by mouth daily.     diltiazem (CARDIZEM CD) 180 MG 24 hr capsule TAKE 1 CAPSULE (180 MG) BY MOUTH EVERY EVENING 90 capsule 1   metoprolol tartrate (LOPRESSOR) 50 MG tablet Take 1 tablet (50 mg total) by mouth in the morning, at noon, and at bedtime. 270 tablet 3   pantoprazole (PROTONIX) 40 MG tablet TAKE 1 TABLET BY MOUTH DAILY 90 tablet 1   pravastatin (PRAVACHOL) 20 MG tablet Take 1 tablet (20 mg  total) by mouth daily. 90 tablet 2   Probiotic Product (PROBIOTIC ADVANCED PO) Take 1 tablet by mouth daily.     sodium chloride (OCEAN) 0.65 % SOLN nasal spray Place 1 spray into both nostrils daily as needed for congestion.     No current facility-administered medications for this visit.    Allergies:   Iodine, Amiodarone, Biaxin [clarithromycin], Codeine, Digoxin and related, Enalapril, Famotidine, Fluocinonide, Iodinated contrast media, Iodine-131, Losartan potassium, Omnicef [cefdinir], Oxycodone, Promethazine, Sertraline, Tizanidine hcl, Warfarin and related, and Xarelto [rivaroxaban]    Social History:  The patient  reports that she has never smoked. She has never used smokeless tobacco. She reports that she does not drink alcohol and does not use drugs.   Family History:  The patient's family history includes Breast cancer in her maternal grandmother; Breast cancer (age of onset: 43) in her sister; Breast cancer (age of onset: 68) in her sister; Breast cancer (age of onset: 12) in her sister; Diabetes in her brother; Esophageal cancer in her brother; Heart disease in her mother and son; Kidney failure in her brother;  Stomach cancer in her father.    ROS:  Please see the history of present illness.   Otherwise, review of systems are positive for none.   All other systems are reviewed and negative.    PHYSICAL EXAM: VS:  BP 110/70 (BP Location: Left Arm, Patient Position: Sitting, Cuff Size: Normal)   Pulse 74   Ht '5\' 3"'$  (1.6 m)   Wt 159 lb 6 oz (72.3 kg)   SpO2 97%   BMI 28.23 kg/m  , BMI Body mass index is 28.23 kg/m. GEN: Well nourished, well developed, in no acute distress  HEENT: Left temporal lobe tenderness Neck: no JVD, carotid bruits, or masses Cardiac: Irregularly irregular; no murmurs, rubs, or gallops,no edema  Respiratory:  clear to auscultation bilaterally, normal work of breathing GI: soft, nontender, nondistended, + BS MS: no deformity or atrophy  Skin: warm and dry, no rash Neuro:  Strength and sensation are intact Psych: euthymic mood, full affect   EKG:  EKG is ordered today. The ekg ordered today demonstrates atrial fibrillation with right bundle branch block and inferior T wave changes.   Recent Labs: 04/15/2022: Hemoglobin 13.4; Platelets 274.0; Pro B Natriuretic peptide (BNP) 159.0 05/26/2022: ALT 18; BUN 12; Creatinine, Ser 0.71; Potassium 4.5; Sodium 131; TSH 0.81    Lipid Panel    Component Value Date/Time   CHOL 146 05/26/2022 1118   TRIG 57.0 05/26/2022 1118   HDL 68.00 05/26/2022 1118   CHOLHDL 2 05/26/2022 1118   VLDL 11.4 05/26/2022 1118   LDLCALC 67 05/26/2022 1118      Wt Readings from Last 3 Encounters:  07/20/22 159 lb 6 oz (72.3 kg)  05/26/22 160 lb 3.2 oz (72.7 kg)  05/11/22 161 lb 9.6 oz (73.3 kg)       ASSESSMENT AND PLAN:  1.  Permanent atrial fibrillation: Ventricular rate is well controlled on metoprolol and diltiazem.   Continue anticoagulation with Eliquis.  I reviewed most recent labs in June which showed normal renal function and CBC.  2. Essential hypertension: Blood pressure is well controlled.  3. History of  tachycardia-induced cardiomyopathy.  Most recent echocardiogram last year showed an EF of 55 to 60% with mild pulmonary hypertension.  4. Left carotid calcifications: Carotid Doppler in August of 2018 showed mild nonobstructive disease.  5.  Hyperlipidemia: She continues to take pravastatin.  I reviewed her recent lipid profile which  showed an LDL of 67.   Disposition:   FU with me in 6 months    Signed, [name], MD 07/20/22 [place], [place]

## 2022-07-28 ENCOUNTER — Other Ambulatory Visit: Payer: Self-pay | Admitting: Nurse Practitioner

## 2022-07-28 ENCOUNTER — Other Ambulatory Visit: Payer: Self-pay | Admitting: Internal Medicine

## 2022-07-28 DIAGNOSIS — Z1231 Encounter for screening mammogram for malignant neoplasm of breast: Secondary | ICD-10-CM

## 2022-08-06 ENCOUNTER — Ambulatory Visit: Payer: Medicare Other | Admitting: Primary Care

## 2022-08-10 ENCOUNTER — Encounter: Payer: Self-pay | Admitting: Student in an Organized Health Care Education/Training Program

## 2022-08-10 ENCOUNTER — Ambulatory Visit: Payer: Medicare Other | Admitting: Student in an Organized Health Care Education/Training Program

## 2022-08-10 ENCOUNTER — Ambulatory Visit
Admission: RE | Admit: 2022-08-10 | Discharge: 2022-08-10 | Disposition: A | Discharge status: Home / Self Care | Payer: Medicare Other | Attending: Student in an Organized Health Care Education/Training Program | Admitting: Student in an Organized Health Care Education/Training Program

## 2022-08-10 ENCOUNTER — Ambulatory Visit
Admission: RE | Admit: 2022-08-10 | Discharge: 2022-08-10 | Disposition: A | Discharge status: Home / Self Care | Payer: Medicare Other | Source: Ambulatory Visit | Attending: Student in an Organized Health Care Education/Training Program | Admitting: Student in an Organized Health Care Education/Training Program

## 2022-08-10 VITALS — BP 128/80 | HR 76 | Temp 97.9°F | Ht 63.0 in | Wt 159.4 lb

## 2022-08-10 DIAGNOSIS — R0609 Other forms of dyspnea: Secondary | ICD-10-CM

## 2022-08-10 DIAGNOSIS — R0600 Dyspnea, unspecified: Secondary | ICD-10-CM | POA: Diagnosis not present

## 2022-08-10 NOTE — Progress Notes (Signed)
Synopsis: Referred in for exertional dyspnea by [name] Mc, MD  Assessment & Plan:   #Dyspnea on Exertion:   [name] [name] is presenting for the evaluation of exertional dyspnea with no other symptoms. She has no history of smoking nor any personal history of lung disease. She has no infectious symptoms, and her physical exam is within normal. There is no wheezing, rales or rhonchi.  Given that the differential for her exertional dyspnea is broad, I will be ruling out pulmonary vascular and parenchymal diseases as a cause behind her shortness of breath. She does have a history of Amiodarone exposure and amiodarone associated lung injury will be on my differential, albeit she doesn't have rales or rhonchi on exam, this remains a possibility.  I will initiate a workup with a CXR today and a pulmonary function test (spirometry, lung volumes, and DLCO). I will see her in follow up in four weeks and continue workup depending on the results of the PFTs.  - CXR today - PFT's  Return in about 4 weeks (around 09/07/2022).  I spent 50 minutes caring for this patient today, including preparing to see the patient, obtaining and/or reviewing separately obtained history, performing a medically appropriate examination and/or evaluation, counseling and educating the patient/family/caregiver, ordering medications, tests, or procedures, and documenting clinical information in the electronic health record  Armando Reichert, MD [facility] 08/10/2022 1:12 PM    End of visit medications:  Current Outpatient Medications:    acetaminophen (TYLENOL) 500 MG tablet, Take 1,000 mg by mouth every 6 (six) hours as needed (pain). , Disp: , Rfl:    aluminum-magnesium hydroxide 200-200 MG/5ML suspension, Take 20 mLs by mouth every 6 (six) hours as needed for indigestion. , Disp: , Rfl:    apixaban (ELIQUIS) 5 MG TABS tablet, Take 1 tablet (5 mg total) by mouth 2 (two) times daily., Disp: 60 tablet,  Rfl: 6   Carboxymethylcellul-Glycerin (LUBRICATING EYE DROPS OP), Place 1 drop into both eyes daily as needed (dry eyes)., Disp: , Rfl:    chlorhexidine (PERIDEX) 0.12 % solution, Use as directed in the mouth or throat., Disp: , Rfl:    cholecalciferol (VITAMIN D) 1000 units tablet, Take 1,000 Units by mouth daily., Disp: , Rfl:    Coenzyme Q10 (COQ10) 200 MG CAPS, Take 200 mg by mouth daily., Disp: , Rfl:    diltiazem (CARDIZEM CD) 180 MG 24 hr capsule, TAKE 1 CAPSULE (180 MG) BY MOUTH EVERY EVENING, Disp: 90 capsule, Rfl: 1   metoprolol tartrate (LOPRESSOR) 50 MG tablet, Take 1 tablet (50 mg total) by mouth in the morning, at noon, and at bedtime., Disp: 270 tablet, Rfl: 3   pantoprazole (PROTONIX) 40 MG tablet, TAKE 1 TABLET BY MOUTH DAILY, Disp: 90 tablet, Rfl: 1   pravastatin (PRAVACHOL) 20 MG tablet, Take 1 tablet (20 mg total) by mouth daily., Disp: 90 tablet, Rfl: 2   Probiotic Product (PROBIOTIC ADVANCED PO), Take 1 tablet by mouth daily., Disp: , Rfl:    sodium chloride (OCEAN) 0.65 % SOLN nasal spray, Place 1 spray into both nostrils daily as needed for congestion., Disp: , Rfl:    Subjective:   PATIENT ID: [name] [name] GENDER: female DOB: 06-28-1936, MRN: [id number]  Chief Complaint  Patient presents with   Acute Visit   pulmonary consult    C/o non prod cough and SOB with exertion x78mo   HPI  [name] [name] a pleasant 86year old female with a past  medical history of atrial fibrillation who is presenting to clinic for the chief complaint of dyspnea on exertion. She is here in the company of her husband.  Patient reports that aside from feeling short of breath with exertion, she has no other complaints. She reports that when walking long distances, she has to go slower and can't catch up to her husband when they go shopping at [employer]. She is able to go up a flight of stairs but that does leave her winded. The shortness of breath is only with exertion and she has no symptoms  with rest.  She denies any chest pain, chest tightness, cough, hemoptysis, reflux, weight changes, rashes, or swollen joints. She also denies any past medical history of pulmonary disease.  She used to work an Marketing executive job as an Scientist, water quality. She has had no exposures to any chemicals that she is aware of. They have lived in the same house since 1960. They don't have mold or water damage to their house. They do not have any pets. She is a lifelong non-smoker.  Ancillary information including prior medications, full medical/surgical/family/social histories, and PFTs (when available) are listed below and have been reviewed.   Review of Systems  Constitutional:  Negative for chills, fever and weight loss.  HENT:  Positive for hearing loss.   Respiratory:  Positive for shortness of breath (exertional). Negative for cough, hemoptysis, sputum production and wheezing.   Cardiovascular:  Positive for leg swelling. Negative for chest pain, palpitations and orthopnea.  Gastrointestinal:  Negative for abdominal pain.  Musculoskeletal:  Positive for back pain. Negative for joint pain.     Objective:   Vitals:   08/10/22 1126  BP: 128/80  Pulse: 76  Temp: 97.9 F (36.6 C)  TempSrc: Temporal  SpO2: 96%  Weight: 159 lb 6.4 oz (72.3 kg)  Height: '5\' 3"'$  (1.6 m)   96% on RA BMI Readings from Last 3 Encounters:  08/10/22 28.24 kg/m  07/20/22 28.23 kg/m  05/26/22 28.38 kg/m   Wt Readings from Last 3 Encounters:  08/10/22 159 lb 6.4 oz (72.3 kg)  07/20/22 159 lb 6 oz (72.3 kg)  05/26/22 160 lb 3.2 oz (72.7 kg)    Physical Exam Constitutional:      General: She is not in acute distress.    Appearance: Normal appearance.  HENT:     Head: Normocephalic.     Nose: Nose normal.     Mouth/Throat:     Mouth: Mucous membranes are moist.  Eyes:     Extraocular Movements: Extraocular movements intact.  Cardiovascular:     Rate and Rhythm: Normal rate. Rhythm irregular.     Pulses:  Normal pulses.     Heart sounds: Normal heart sounds.  Pulmonary:     Effort: Pulmonary effort is normal. No respiratory distress.     Breath sounds: Normal breath sounds. No wheezing, rhonchi or rales.  Abdominal:     General: There is distension.     Palpations: Abdomen is soft.  Musculoskeletal:     Cervical back: Normal range of motion.  Skin:    General: Skin is warm.  Neurological:     General: No focal deficit present.     Mental Status: She is alert and oriented to person, place, and time. Mental status is at baseline.       Ancillary Information    Past Medical History:  Diagnosis Date   Actinic keratosis    Arthritis    Atrial flutter ([place]) 02/2011  s/p cardioversion    Chest pain    a. H/o cardiac cath x 2-> 2012 -->nl cors;  b. 12/2016 MV: EF 61%, small region of mild perfusion defect in the apical anteroseptal region c/w breast attenuation, no ischemia-->Low risk; c. 06/2019 MV: Mod size, mild inflat ischemia, EF 59%. Cor and Ao Ca2+. Inflat defect more pronounced on this study compared to last; c. 07/2019 Cath: LM nl, LAD min irregs, LCX nl, OM1/2/3 nl, RCA min irregs.   CKD (chronic kidney disease), stage III ([place])    Concussion with no loss of consciousness 09/01/2016   Cystocele    Degenerative disorder of bone    GERD (gastroesophageal reflux disease)    Headache(784.0)    chronic   Hiatal hernia    Hyperlipidemia    Hypertension    Influenza 01/10/2017   Knee fracture    Migraines    Mobitz type 2 second degree atrioventricular block    a. felt to be 2/2 amiodarone, resolved with decreased amiodarone dose.  Amio since d/c'd.   Permanent atrial fibrillation (HCC)    a. status post multiple DCCVs; b. 2018 - eval for PVI but opted for rate control.   PONV (postoperative nausea and vomiting)    oxycodone and codiene cause N/V    Pre-syncope    a. In setting of dehydration and AKI in the past.   Sleep apnea    Squamous cell carcinoma of skin 11/10/2020    left distal posterior deltoid (EDC 01/15/2021)   Tachycardia induced cardiomyopathy ([place])    a. Resolved;  b. 08/2017 Echo: EF 50-55%, no rwma, mild MR, mildly to mod dil LA/RA; c. 02/2018 Echo: EF 55-60%, mild MR. Mildly dil LA. Nl RVSP. PASP 73mHg.   Venous insufficiency    Vertigo      Family History  Problem Relation Age of Onset   Heart disease Mother    Stomach cancer Father    Heart disease Son        found at autopsy   Breast cancer Sister [phone]  Breast cancer Maternal Grandmother    Breast cancer Sister [phone]  Diabetes Brother    Esophageal cancer Brother    Kidney failure Brother    Breast cancer Sister [phone]  Ovarian cancer Neg Hx      Past Surgical History:  Procedure Laterality Date   ABDOMINAL HYSTERECTOMY  1990   APPENDECTOMY     AUGMENTATION MAMMAPLASTY Bilateral 1986   implants   AUGMENTATION MAMMAPLASTY  1990   AUGMENTATION MAMMAPLASTY  2011   CARDIAC CATHETERIZATION     CARDIOVERSION     x 3   CARDIOVERSION     CARDIOVERSION N/A 02/07/2017   Procedure: CARDIOVERSION;  Surgeon: MWellington Hampshire MD;  Location: [facility];  Service: Cardiovascular;  Laterality: N/A;   CARDIOVERSION N/A 07/22/2017   Procedure: Cardioversion;  Surgeon: GMinna Merritts MD;  Location: [facility];  Service: Cardiovascular;  Laterality: N/A;   CATARACT EXTRACTION W/PHACO Right 09/21/2016   Procedure: CATARACT EXTRACTION PHACO AND INTRAOCULAR LENS PLACEMENT ([place];  Surgeon: WBirder Robson MD;  Location: [facility];  Service: Ophthalmology;  Laterality: Right;  [place]44.[place] AP% 16.5 CDE 7.30 Fluid Pack Lot [id number]   CATARACT EXTRACTION W/PHACO Left 10/19/2016   Procedure: CATARACT EXTRACTION PHACO AND INTRAOCULAR LENS PLACEMENT (IOC);  Surgeon: WBirder Robson MD;  Location: [facility];  Service: Ophthalmology;  Laterality: Left;  [place]53.7 AP% 19.5 CDE 10.45 Fluid pack lot # [id number]   CHOLECYSTECTOMY  COMBINED AUGMENTATION MAMMAPLASTY AND ABDOMINOPLASTY     JOINT REPLACEMENT Left  06/04/2013   left knee   KNEE ARTHROSCOPY Right 08/16/2016   Procedure: ARTHROSCOPY KNEE, tear posterior horn medial meniscus, tear anterior and posterior horns of lateral meniscus, chondromalacia of lateral compartment grade 3 patella and grade 4 medial;  Surgeon: Dereck Leep, MD;  Location: [facility];  Service: Orthopedics;  Laterality: Right;   LEFT HEART CATH AND CORONARY ANGIOGRAPHY Left 07/27/2019   Procedure: LEFT HEART CATH AND CORONARY ANGIOGRAPHY;  Surgeon: Wellington Hampshire, MD;  Location: [facility];  Service: Cardiovascular;  Laterality: Left;   MASTECTOMY  1986   nipple sparing mastectomy/Bilateral with silicone  breast implants, s/p saline replacements   Multiple orthopedic procedures     NOSE SURGERY     TEE WITHOUT CARDIOVERSION N/A 09/26/2017   Procedure: TRANSESOPHAGEAL ECHOCARDIOGRAM (TEE);  Surgeon: Fay Records, MD;  Location: [facility];  Service: Cardiovascular;  Laterality: N/A;   TOTAL KNEE ARTHROPLASTY Left     Social History   Socioeconomic History   Marital status: Married    Spouse name: Not on file   Number of children: 1   Years of education: Not on file   Highest education level: Not on file  Occupational History    Employer: RETIRED  Tobacco Use   Smoking status: Never   Smokeless tobacco: Never  Vaping Use   Vaping Use: Never used  Substance and Sexual Activity   Alcohol use: No   Drug use: No   Sexual activity: Never    Birth control/protection: Surgical  Other Topics Concern   Not on file  Social History Narrative   Married    Social Determinants of Health   Financial Resource Strain: Low Risk  (05/06/2022)   Overall Financial Resource Strain (CARDIA)    Difficulty of Paying Living Expenses: Not hard at all  Food Insecurity: No Food Insecurity (05/06/2022)   Hunger Vital Sign    Worried About Running Out of Food in the Last Year: Never true    Ran Out of Food in the Last Year: Never true  Transportation Needs: No  Transportation Needs (05/06/2022)   PRAPARE - Hydrologist (Medical): No    Lack of Transportation (Non-Medical): No  Physical Activity: Insufficiently Active (05/06/2022)   Exercise Vital Sign    Days of Exercise per Week: 3 days    Minutes of Exercise per Session: 20 min  Stress: No Stress Concern Present (05/06/2022)   [place]    Feeling of Stress : Not at all  Social Connections: Unknown (05/06/2022)   Social Connection and Isolation Panel [NHANES]    Frequency of Communication with Friends and Family: Not on file    Frequency of Social Gatherings with Friends and Family: Not on file    Attends Religious Services: Not on file    Active Member of Clubs or Organizations: Not on file    Attends Archivist Meetings: Not on file    Marital Status: Married  Intimate Partner Violence: Not At Risk (05/06/2022)   Humiliation, Afraid, Rape, and Kick questionnaire    Fear of Current or Ex-Partner: No    Emotionally Abused: No    Physically Abused: No    Sexually Abused: No     Allergies  Allergen Reactions   Iodine Anaphylaxis    NO PROBLEMS WITH BETADINE   Amiodarone     Cannot  remember    Biaxin [Clarithromycin] Nausea And Vomiting   Codeine Nausea And Vomiting   Digoxin And Related Other (See Comments)    Fatigue, eye puffiness, hoarsness   Enalapril Other (See Comments)    unknown   Famotidine     Caused dizziness   Fluocinonide Other (See Comments)    Tingling sensation in head and redness to scalp.   Iodinated Contrast Media Other (See Comments)    Tachycardia   Iodine-131    Losartan Potassium Other (See Comments)    Dizzy and headache   Omnicef [Cefdinir]    Oxycodone Nausea And Vomiting   Promethazine Other (See Comments)    Unknown    Sertraline     Dizziness and weakness   Tizanidine Hcl Other (See Comments)    hypotension    Warfarin And Related      Bleeding    Xarelto [Rivaroxaban] Other (See Comments)    bleeding     CBC    Component Value Date/Time   WBC 6.8 04/15/2022 0844   RBC 4.21 04/15/2022 0844   HGB 13.4 04/15/2022 0844   HGB 14.1 01/29/2021 0845   HCT 40.8 04/15/2022 0844   HCT 42.0 01/29/2021 0845   PLT 274.0 04/15/2022 0844   PLT 287 01/29/2021 0845   MCV 96.8 04/15/2022 0844   MCV 93 01/29/2021 0845   MCV 99 01/11/2015 1623   MCH 31.3 01/29/2021 0845   MCH 31.7 04/26/2020 0744   MCHC 32.8 04/15/2022 0844   RDW 13.7 04/15/2022 0844   RDW 12.2 01/29/2021 0845   RDW 14.3 01/11/2015 1623   LYMPHSABS 0.7 04/15/2022 0844   LYMPHSABS 0.8 01/29/2021 0845   LYMPHSABS 0.9 (L) 01/11/2015 1623   MONOABS 0.4 04/15/2022 0844   MONOABS 0.7 01/11/2015 1623   EOSABS 0.1 04/15/2022 0844   EOSABS 0.1 01/29/2021 0845   EOSABS 0.0 01/11/2015 1623   BASOSABS 0.1 04/15/2022 0844   BASOSABS 0.0 01/29/2021 0845   BASOSABS 0.1 01/11/2015 1623    Pulmonary Functions Testing Results:     No data to display          Outpatient Medications Prior to Visit  Medication Sig Dispense Refill   acetaminophen (TYLENOL) 500 MG tablet Take 1,000 mg by mouth every 6 (six) hours as needed (pain).      aluminum-magnesium hydroxide 200-200 MG/5ML suspension Take 20 mLs by mouth every 6 (six) hours as needed for indigestion.      apixaban (ELIQUIS) 5 MG TABS tablet Take 1 tablet (5 mg total) by mouth 2 (two) times daily. 60 tablet 6   Carboxymethylcellul-Glycerin (LUBRICATING EYE DROPS OP) Place 1 drop into both eyes daily as needed (dry eyes).     chlorhexidine (PERIDEX) 0.12 % solution Use as directed in the mouth or throat.     cholecalciferol (VITAMIN D) 1000 units tablet Take 1,000 Units by mouth daily.     Coenzyme Q10 (COQ10) 200 MG CAPS Take 200 mg by mouth daily.     diltiazem (CARDIZEM CD) 180 MG 24 hr capsule TAKE 1 CAPSULE (180 MG) BY MOUTH EVERY EVENING 90 capsule 1   metoprolol tartrate (LOPRESSOR) 50 MG tablet Take 1  tablet (50 mg total) by mouth in the morning, at noon, and at bedtime. 270 tablet 3   pantoprazole (PROTONIX) 40 MG tablet TAKE 1 TABLET BY MOUTH DAILY 90 tablet 1   pravastatin (PRAVACHOL) 20 MG tablet Take 1 tablet (20 mg total) by mouth daily. 90 tablet 2  Probiotic Product (PROBIOTIC ADVANCED PO) Take 1 tablet by mouth daily.     sodium chloride (OCEAN) 0.65 % SOLN nasal spray Place 1 spray into both nostrils daily as needed for congestion.     No facility-administered medications prior to visit.

## 2022-08-24 ENCOUNTER — Telehealth: Payer: Self-pay | Admitting: Cardiovascular Disease

## 2022-08-24 NOTE — Telephone Encounter (Signed)
Pt c/o of Chest Pain: STAT if CP now or developed within 24 hours  1. Are you having CP right now? No   2. Are you experiencing any other symptoms (ex. SOB, nausea, vomiting, sweating)? No   3. How long have you been experiencing CP? Has been on going for awhile  4. Is your CP continuous or coming and going? Comes and goes  5. Have you taken Nitroglycerin? No    Patient has been scheduled per her request on 09/11 to be seen for recent episodes. ?

## 2022-08-25 NOTE — Telephone Encounter (Signed)
Called the pt lmtcb.

## 2022-08-26 NOTE — Telephone Encounter (Signed)
2nd attempt to contact the pt. Lmtcb.

## 2022-08-30 ENCOUNTER — Ambulatory Visit: Payer: Medicare Other | Admitting: Medical

## 2022-08-30 ENCOUNTER — Ambulatory Visit: Payer: Medicare Other | Attending: Physician Assistant | Admitting: Physician Assistant

## 2022-08-30 ENCOUNTER — Encounter: Payer: Self-pay | Admitting: Physician Assistant

## 2022-08-30 VITALS — BP 120/70 | HR 81 | Ht 64.0 in | Wt 159.0 lb

## 2022-08-30 DIAGNOSIS — I10 Essential (primary) hypertension: Secondary | ICD-10-CM

## 2022-08-30 DIAGNOSIS — I2510 Atherosclerotic heart disease of native coronary artery without angina pectoris: Secondary | ICD-10-CM

## 2022-08-30 DIAGNOSIS — Z8679 Personal history of other diseases of the circulatory system: Secondary | ICD-10-CM | POA: Diagnosis not present

## 2022-08-30 DIAGNOSIS — R072 Precordial pain: Secondary | ICD-10-CM | POA: Diagnosis not present

## 2022-08-30 DIAGNOSIS — R079 Chest pain, unspecified: Secondary | ICD-10-CM

## 2022-08-30 DIAGNOSIS — E785 Hyperlipidemia, unspecified: Secondary | ICD-10-CM | POA: Diagnosis not present

## 2022-08-30 DIAGNOSIS — I4821 Permanent atrial fibrillation: Secondary | ICD-10-CM

## 2022-08-30 DIAGNOSIS — R0602 Shortness of breath: Secondary | ICD-10-CM | POA: Diagnosis not present

## 2022-08-30 NOTE — Telephone Encounter (Signed)
Patient seen for an o/v today.

## 2022-08-30 NOTE — Progress Notes (Signed)
Cardiology Office Note    Date:  08/30/2022   ID:  [name] [name], DOB 06/03/1936, MRN [id number]  PCP:  [name], MD  Cardiologist:  [name], MD  Electrophysiologist:  None   Chief Complaint: Chest pain  History of Present Illness:   [name] [name] is a 86 y.o. female with history of nonobstructive CAD by [place] in 2012 and 07/2019, permanent A-fib status post multiple cardioversions in the past, atrial flutter, tachycardia-induced cardiomyopathy, HTN, and HLD who presents for evaluation of chest pain.  She underwent Lexiscan MPI in 06/2019 which showed possible inferolateral ischemia with normal EF.  LHC in 07/2019 showed minimal irregularities with no evidence of obstructive CAD, normal LVEF, and mildly elevated LVEDP.  Echo from 01/2021 showed normal LV systolic function with an EF of 55 to 60% with mild to moderate pulmonary hypertension with an estimated PASP of 45 mmHg.  She was last seen in the office in 07/2022 and was doing reasonably well with no shortness of breath or palpitations.  She had an occasional sensation in her chest when her heart rate was slightly elevated, though without true chest pain.  Blood pressure was well controlled at 110/70.  No changes were indicated.  Follow-up was recommended for 6 months.  She comes in accompanied by her husband today.  He is concerned with the patient having an increase in exertional dyspnea/fatigue, and intermittent chest discomfort.  Symptoms are largely unchanged when compared to how she was feeling last month.  She has also been noting an increase in indigestion and belching.  He is concerned regarding the symptoms given a prior coworker that had similar symptoms and apparently suffered an MI there after.  Patient remains fairly active at baseline.  She reports her symptoms of shortness of breath and chest discomfort are typically after going up and down her stairs 4-5 times per day carrying loads of laundry.  No falls or symptoms  concerning for bleeding.  No dizziness, presyncope, or syncope.  Her husband is also concerned with the patient's interrupted sleep.  Currently chest pain-free.   Labs independently reviewed: 05/2022 - TSH normal, A1c 6.5, potassium 4.5, BUN 12, serum creatinine 0.71, albumin 4.3, AST/ALT normal, TC 146, TG 57, HDL 60, LDL 67 03/2022 - Hgb 13.4, PLT 274, BNP 159  Past Medical History:  Diagnosis Date   Actinic keratosis    Arthritis    Atrial flutter ([place]) 02/2011   s/p cardioversion    Chest pain    a. H/o cardiac cath x 2-> 2012 -->nl cors;  b. 12/2016 MV: EF 61%, small region of mild perfusion defect in the apical anteroseptal region c/w breast attenuation, no ischemia-->Low risk; c. 06/2019 MV: Mod size, mild inflat ischemia, EF 59%. Cor and Ao Ca2+. Inflat defect more pronounced on this study compared to last; c. 07/2019 Cath: LM nl, LAD min irregs, LCX nl, OM1/2/3 nl, RCA min irregs.   CKD (chronic kidney disease), stage III ([place])    Concussion with no loss of consciousness 09/01/2016   Cystocele    Degenerative disorder of bone    GERD (gastroesophageal reflux disease)    Headache(784.0)    chronic   Hiatal hernia    Hyperlipidemia    Hypertension    Influenza 01/10/2017   Knee fracture    Migraines    Mobitz type 2 second degree atrioventricular block    a. felt to be 2/2 amiodarone, resolved with decreased amiodarone dose.  Amio since d/c'd.  Permanent atrial fibrillation (HCC)    a. status post multiple DCCVs; b. 2018 - eval for PVI but opted for rate control.   PONV (postoperative nausea and vomiting)    oxycodone and codiene cause N/V    Pre-syncope    a. In setting of dehydration and AKI in the past.   Sleep apnea    Squamous cell carcinoma of skin 11/10/2020   left distal posterior deltoid (EDC 01/15/2021)   Tachycardia induced cardiomyopathy ([place])    a. Resolved;  b. 08/2017 Echo: EF 50-55%, no rwma, mild MR, mildly to mod dil LA/RA; c. 02/2018 Echo: EF 55-60%, mild  MR. Mildly dil LA. Nl RVSP. PASP 54mHg.   Venous insufficiency    Vertigo     Past Surgical History:  Procedure Laterality Date   ABDOMINAL HYSTERECTOMY  1990   APPENDECTOMY     AUGMENTATION MAMMAPLASTY Bilateral 1986   implants   AUGMENTATION MAMMAPLASTY  1990   AUGMENTATION MAMMAPLASTY  2011   CARDIAC CATHETERIZATION     CARDIOVERSION     x 3   CARDIOVERSION     CARDIOVERSION N/A 02/07/2017   Procedure: CARDIOVERSION;  Surgeon: MWellington Hampshire MD;  Location: [facility];  Service: Cardiovascular;  Laterality: N/A;   CARDIOVERSION N/A 07/22/2017   Procedure: Cardioversion;  Surgeon: GMinna Merritts MD;  Location: [facility];  Service: Cardiovascular;  Laterality: N/A;   CATARACT EXTRACTION W/PHACO Right 09/21/2016   Procedure: CATARACT EXTRACTION PHACO AND INTRAOCULAR LENS PLACEMENT ([place];  Surgeon: WBirder Robson MD;  Location: [facility];  Service: Ophthalmology;  Laterality: Right;  [place]44.1 AP% 16.5 CDE 7.30 Fluid Pack Lot ##[id number]   CATARACT EXTRACTION W/PHACO Left 10/19/2016   Procedure: CATARACT EXTRACTION PHACO AND INTRAOCULAR LENS PLACEMENT (IOC);  Surgeon: WBirder Robson MD;  Location: [facility];  Service: Ophthalmology;  Laterality: Left;  [place]53.7 AP% 19.5 CDE 10.45 Fluid pack lot # [id number]   CHOLECYSTECTOMY     COMBINED AUGMENTATION MAMMAPLASTY AND ABDOMINOPLASTY     JOINT REPLACEMENT Left 06/04/2013   left knee   KNEE ARTHROSCOPY Right 08/16/2016   Procedure: ARTHROSCOPY KNEE, tear posterior horn medial meniscus, tear anterior and posterior horns of lateral meniscus, chondromalacia of lateral compartment grade 3 patella and grade 4 medial;  Surgeon: JDereck Leep MD;  Location: [facility];  Service: Orthopedics;  Laterality: Right;   LEFT HEART CATH AND CORONARY ANGIOGRAPHY Left 07/27/2019   Procedure: LEFT HEART CATH AND CORONARY ANGIOGRAPHY;  Surgeon: AWellington Hampshire MD;  [facility];  Service: Cardiovascular;  Laterality: Left;   MASTECTOMY   1986   nipple sparing mastectomy/Bilateral with silicone  breast implants, s/p saline replacements   Multiple orthopedic procedures     NOSE SURGERY     TEE WITHOUT CARDIOVERSION N/A 09/26/2017   Procedure: TRANSESOPHAGEAL ECHOCARDIOGRAM (TEE);  Surgeon: RFay Records MD;  [facility];  Service: Cardiovascular;  Laterality: N/A;   TOTAL KNEE ARTHROPLASTY Left     Current Medications: Current Meds  Medication Sig   acetaminophen (TYLENOL) 500 MG tablet Take 1,000 mg by mouth every 6 (six) hours as needed (pain).    aluminum-magnesium hydroxide 200-200 MG/5ML suspension Take 20 mLs by mouth every 6 (six) hours as needed for indigestion.    apixaban (ELIQUIS) 5 MG TABS tablet Take 1 tablet (5 mg total) by mouth 2 (two) times daily.   Carboxymethylcellul-Glycerin (LUBRICATING EYE DROPS OP) Place 1 drop into both eyes daily as needed (dry eyes).   chlorhexidine ([place]outh [place]  0.12 % solution Use as directed in the mouth or throat.   cholecalciferol (VITAMIN D) 1000 units tablet Take 1,000 Units by mouth daily.   Coenzyme Q10 (COQ10) 200 MG CAPS Take 200 mg by mouth daily.   diltiazem (CARDIZEM CD) 180 MG 24 hr capsule TAKE 1 CAPSULE (180 MG) BY MOUTH EVERY EVENING   metoprolol tartrate (LOPRESSOR) 50 MG tablet Take 1 tablet (50 mg total) by mouth in the morning, at noon, and at bedtime.   pantoprazole (PROTONIX) 40 MG tablet TAKE 1 TABLET BY MOUTH DAILY   pravastatin (PRAVACHOL) 20 MG tablet Take 1 tablet (20 mg total) by mouth daily.   sodium chloride (OCEAN) 0.65 % SOLN nasal spray Place 1 spray into both nostrils daily as needed for congestion.    Allergies:   Iodine, Amiodarone, Biaxin [clarithromycin], Codeine, Digoxin and related, Enalapril, Famotidine, Fluocinonide, Iodinated contrast media, Iodine-131, Losartan potassium, Omnicef [cefdinir], Oxycodone, Promethazine, Sertraline, Tizanidine hcl, Warfarin and related, and Xarelto [rivaroxaban]   Social History   Socioeconomic  History   Marital status: Married    Spouse name: Not on file   Number of children: 1   Years of education: Not on file   Highest education level: Not on file  Occupational History    Employer: RETIRED  Tobacco Use   Smoking status: Never   Smokeless tobacco: Never  Vaping Use   Vaping Use: Never used  Substance and Sexual Activity   Alcohol use: No   Drug use: No   Sexual activity: Never    Birth control/protection: Surgical  Other Topics Concern   Not on file  Social History Narrative   Married    Social Determinants of Health   Financial Resource Strain: Low Risk  (05/06/2022)   Overall Financial Resource Strain (CARDIA)    Difficulty of Paying Living Expenses: Not hard at all  Food Insecurity: No Food Insecurity (05/06/2022)   Hunger Vital Sign    Worried About Running Out of Food in the Last Year: Never true    Ran Out of Food in the Last Year: Never true  Transportation Needs: No Transportation Needs (05/06/2022)   PRAPARE - Hydrologist (Medical): No    Lack of Transportation (Non-Medical): No  Physical Activity: Insufficiently Active (05/06/2022)   Exercise Vital Sign    Days of Exercise per Week: 3 days    Minutes of Exercise per Session: 20 min  Stress: No Stress Concern Present (05/06/2022)   [place]    Feeling of Stress : Not at all  Social Connections: Unknown (05/06/2022)   Social Connection and Isolation Panel [NHANES]    Frequency of Communication with Friends and Family: Not on file    Frequency of Social Gatherings with Friends and Family: Not on file    Attends Religious Services: Not on file    Active Member of Clubs or Organizations: Not on file    Attends Archivist Meetings: Not on file    Marital Status: Married     Family History:  The patient's family history includes Breast cancer in her maternal grandmother; Breast cancer (age of onset:  65) in her sister; Breast cancer (age of onset: 62) in her sister; Breast cancer (age of onset: 15) in her sister; Diabetes in her brother; Esophageal cancer in her brother; Heart disease in her mother and son; Kidney failure in her brother; Stomach cancer in her father. There is no history  of Ovarian cancer.  ROS:   12-point review of systems is negative unless otherwise noted in the HPI.   EKGs/Labs/Other Studies Reviewed:    Studies reviewed were summarized above. The additional studies were reviewed today: As above.  EKG:  EKG is ordered today.  The EKG ordered today demonstrates A-fib, 81 bpm, RBBB, inferior T wave changes, no significant change when compared to prior tracing  Recent Labs: 04/15/2022: Hemoglobin 13.4; Platelets 274.0; Pro B Natriuretic peptide (BNP) 159.0 05/26/2022: ALT 18; BUN 12; Creatinine, Ser 0.71; Potassium 4.5; Sodium 131; TSH 0.81  Recent Lipid Panel    Component Value Date/Time   CHOL 146 05/26/2022 1118   TRIG 57.0 05/26/2022 1118   HDL 68.00 05/26/2022 1118   CHOLHDL 2 05/26/2022 1118   VLDL 11.4 05/26/2022 1118   LDLCALC 67 05/26/2022 1118    PHYSICAL EXAM:    VS:  BP 120/70 (BP Location: Left Arm, Patient Position: Sitting, Cuff Size: Large)   Pulse 81   Ht '5\' 4"'$  (1.626 m)   Wt 159 lb (72.1 kg)   SpO2 97%   BMI 27.29 kg/m   BMI: Body mass index is 27.29 kg/m.  Physical Exam Vitals reviewed.  Constitutional:      Appearance: She is well-developed.  HENT:     Head: Normocephalic and atraumatic.  Eyes:     General:        Right eye: No discharge.        Left eye: No discharge.  Neck:     Vascular: No JVD.  Cardiovascular:     Rate and Rhythm: Normal rate. Rhythm irregularly irregular.     Pulses:          Posterior tibial pulses are 2+ on the right side and 2+ on the left side.     Heart sounds: Normal heart sounds, S1 normal and S2 normal. Heart sounds not distant. No midsystolic click and no opening snap. No murmur heard.    No  friction rub.  Pulmonary:     Effort: Pulmonary effort is normal. No respiratory distress.     Breath sounds: Normal breath sounds. No decreased breath sounds, wheezing or rales.  Chest:     Chest wall: No tenderness.  Abdominal:     General: There is no distension.  Musculoskeletal:     Cervical back: Normal range of motion.     Right lower leg: No edema.     Left lower leg: No edema.  Skin:    General: Skin is warm and dry.     Nails: There is no clubbing.  Neurological:     Mental Status: She is alert and oriented to person, place, and time.  Psychiatric:        Speech: Speech normal.        Behavior: Behavior normal.        Thought Content: Thought content normal.        Judgment: Judgment normal.     Wt Readings from Last 3 Encounters:  08/30/22 159 lb (72.1 kg)  08/10/22 159 lb 6.4 oz (72.3 kg)  07/20/22 159 lb 6 oz (72.3 kg)     ASSESSMENT & PLAN:   Nonobstructive CAD with chest pain with moderate risk for cardiac etiology: Currently chest pain-free.  Schedule Lexiscan MPI to evaluate for high risk ischemia and echo given exertional dyspnea component.  Continue aggressive risk factor modification and current medical therapy including Eliquis in place of aspirin to minimize bleeding given permanent A-fib, Lopressor,  and pravastatin.  Permanent A-fib: She remains in A-fib with controlled ventricular response.  She remains on Lopressor 50 mg 3 times daily and Cardizem CD 180 mg daily.  CHA2DS2-VASc at least 6 (CHF, HTN, age x2, vascular disease, sex category).  She remains on apixaban 5 mg twice daily and does not meet reduced dosing criteria.  Recent labs showed stable potassium, renal function, and hemoglobin.  History of tachycardia induced cardiomyopathy: She appears euvolemic and well compensated.  Most recent echo from 01/2021 showed normal LV systolic function with normal wall motion.  HTN: Blood pressure is well controlled in the office today.  She remains on  Cardizem CD 180 mg and Lopressor 50 mg 3 times daily.  HLD: LDL 67 in 05/2022.  She remains on pravastatin 20 mg.   Shared Decision Making/Informed Consent{  The risks [chest pain, shortness of breath, cardiac arrhythmias, dizziness, blood pressure fluctuations, myocardial infarction, stroke/transient ischemic attack, nausea, vomiting, allergic reaction, radiation exposure, metallic taste sensation and life-threatening complications (estimated to be 1 in 10,000)], benefits (risk stratification, diagnosing coronary artery disease, treatment guidance) and alternatives of a nuclear stress test were discussed in detail with Ms. Desantiago and she agrees to proceed.     Disposition: F/u with Dr. Fletcher Anon or an APP in 2 months.   Medication Adjustments/Labs and Tests Ordered: Current medicines are reviewed at length with the patient today.  Concerns regarding medicines are outlined above. Medication changes, Labs and Tests ordered today are summarized above and listed in the Patient Instructions accessible in Encounters.   Signed, Christell Faith, PA-C 08/30/2022 9:40 AM     [facility] [address] [place] [address] [place], [place] [address] [phone]

## 2022-08-30 NOTE — Patient Instructions (Addendum)
Medication Instructions:  Your physician recommends that you continue on your current medications as directed. Please refer to the Current Medication list given to you today.  *If you need a refill on your cardiac medications before your next appointment, please call your pharmacy*   Lab Work: None  If you have labs (blood work) drawn today and your tests are completely normal, you will receive your results only by: [facility] (if you have MyChart) OR A paper copy in the mail If you have any lab test that is abnormal or we need to change your treatment, we will call you to review the results.   Testing/Procedures: Your physician has requested that you have an echocardiogram. Echocardiography is a painless test that uses sound waves to create images of your heart. It provides your doctor with information about the size and shape of your heart and how well your heart's chambers and valves are working. This procedure takes approximately one hour. There are no restrictions for this procedure.  [place]  Your caregiver has ordered a Stress Test with nuclear imaging. The purpose of this test is to evaluate the blood supply to your heart muscle. This procedure is referred to as a "Non-Invasive Stress Test." This is because other than having an IV started in your vein, nothing is inserted or "invades" your body. Cardiac stress tests are done to find areas of poor blood flow to the heart by determining the extent of coronary artery disease (CAD). Some patients exercise on a treadmill, which naturally increases the blood flow to your heart, while others who are  unable to walk on a treadmill due to physical limitations have a pharmacologic/chemical stress agent called Lexiscan . This medicine will mimic walking on a treadmill by temporarily increasing your coronary blood flow.   Please note: these test may take anywhere between 2-4 hours to complete  PLEASE REPORT TO [facility] AT THE FIRST DESK WILL DIRECT YOU WHERE TO GO  Date of Procedure:____________________  Arrival Time for Procedure:___________________________  Instructions regarding medication:   _XX___ : You may take all of your medications the morning of your test.   PLEASE NOTIFY THE OFFICE AT LEAST 24 HOURS IN ADVANCE IF YOU ARE UNABLE TO [place].  [phone] AND  PLEASE NOTIFY NUCLEAR MEDICINE AT [facility] AT LEAST 24 HOURS IN ADVANCE IF YOU ARE UNABLE TO KEEP YOUR APPOINTMENT. [phone]  How to prepare for your Myoview test:  Do not eat or drink after midnight No caffeine for 24 hours prior to test No smoking 24 hours prior to test. Your medication may be taken with water.  If your doctor stopped a medication because of this test, do not take that medication. Ladies, please do not wear dresses.  Skirts or pants are appropriate. Please wear a short sleeve shirt. No perfume, cologne or lotion. Wear comfortable walking shoes. No heels!    Follow-Up: At [facility], you and your health needs are our priority.  As part of our continuing mission to provide you with exceptional heart care, we have created designated Provider Care Teams.  These Care Teams include your primary Cardiologist (physician) and Advanced Practice Providers (APPs -  Physician Assistants and Nurse Practitioners) who all work together to provide you with the care you need, when you need it.  We recommend signing up for the patient portal called "MyChart".  Sign up information is provided on this After Visit Summary.  MyChart is used  to connect with patients for Virtual Visits (Telemedicine).  Patients are able to view lab/test results, encounter notes, upcoming appointments, etc.  Non-urgent messages can be sent to your provider as well.   To learn more about what you can do with MyChart, go to [email or web address].    Your next appointment:   2 month(s)  The format for your next  appointment:   In Person  Provider:   [name], MD or Christell Faith, PA-C        Important Information About Sugar

## 2022-09-02 ENCOUNTER — Ambulatory Visit: Payer: Medicare Other

## 2022-09-02 DIAGNOSIS — J309 Allergic rhinitis, unspecified: Secondary | ICD-10-CM | POA: Diagnosis not present

## 2022-09-07 ENCOUNTER — Ambulatory Visit
Admission: RE | Admit: 2022-09-07 | Discharge: 2022-09-07 | Disposition: A | Discharge status: Home / Self Care | Payer: Medicare Other | Source: Ambulatory Visit | Attending: Internal Medicine | Admitting: Internal Medicine

## 2022-09-07 DIAGNOSIS — Z1231 Encounter for screening mammogram for malignant neoplasm of breast: Secondary | ICD-10-CM | POA: Insufficient documentation

## 2022-09-08 ENCOUNTER — Ambulatory Visit: Payer: Medicare Other | Admitting: Student in an Organized Health Care Education/Training Program

## 2022-09-09 ENCOUNTER — Encounter
Admission: RE | Admit: 2022-09-09 | Discharge: 2022-09-09 | Disposition: A | Discharge status: Home / Self Care | Payer: Medicare Other | Source: Ambulatory Visit | Attending: Physician Assistant | Admitting: Physician Assistant

## 2022-09-09 DIAGNOSIS — R072 Precordial pain: Secondary | ICD-10-CM | POA: Insufficient documentation

## 2022-09-09 LAB — NM MYOCAR MULTI W/SPECT W/WALL MOTION / EF
Estimated workload: 1
Exercise duration (min): 0 min
Exercise duration (sec): 0 s
LV dias vol: 79 mL (ref 46–106)
LV sys vol: 49 mL
MPHR: 136 {beats}/min
Nuc Stress EF: 38 %
Peak HR: 107 {beats}/min
Percent HR: 78 %
Rest HR: 105 {beats}/min
Rest Nuclear Isotope Dose: 10.2 mCi
SDS: 4
SRS: 14
SSS: 15
Stress Nuclear Isotope Dose: 32.5 mCi
TID: 0.93

## 2022-09-09 MED ORDER — TECHNETIUM TC 99M TETROFOSMIN IV KIT
10.0000 | PACK | Freq: Once | INTRAVENOUS | Status: AC | PRN
  Administered 2022-09-09: 10.16 via INTRAVENOUS

## 2022-09-09 MED ORDER — TECHNETIUM TC 99M TETROFOSMIN IV KIT
32.4900 | PACK | Freq: Once | INTRAVENOUS | Status: AC | PRN
  Administered 2022-09-09: 32.49 via INTRAVENOUS

## 2022-09-09 MED ORDER — REGADENOSON 0.4 MG/5ML IV SOLN
0.4000 mg | Freq: Once | INTRAVENOUS | Status: AC
  Administered 2022-09-09: 0.4 mg via INTRAVENOUS

## 2022-09-10 ENCOUNTER — Other Ambulatory Visit: Payer: Self-pay | Admitting: *Deleted

## 2022-09-10 MED ORDER — APIXABAN 5 MG PO TABS: 5.0000 mg | ORAL_TABLET | Freq: Two times a day (BID) | ORAL | 6 refills | Status: DC

## 2022-09-16 DIAGNOSIS — J069 Acute upper respiratory infection, unspecified: Secondary | ICD-10-CM | POA: Diagnosis not present

## 2022-09-16 DIAGNOSIS — J029 Acute pharyngitis, unspecified: Secondary | ICD-10-CM | POA: Diagnosis not present

## 2022-09-16 DIAGNOSIS — Z03818 Encounter for observation for suspected exposure to other biological agents ruled out: Secondary | ICD-10-CM | POA: Diagnosis not present

## 2022-09-20 ENCOUNTER — Other Ambulatory Visit: Payer: Self-pay | Admitting: Cardiovascular Disease

## 2022-10-05 ENCOUNTER — Ambulatory Visit (INDEPENDENT_AMBULATORY_CARE_PROVIDER_SITE_OTHER): Payer: Medicare Other

## 2022-10-05 DIAGNOSIS — Z23 Encounter for immunization: Secondary | ICD-10-CM

## 2022-10-06 ENCOUNTER — Ambulatory Visit: Payer: Medicare Other

## 2022-10-11 ENCOUNTER — Ambulatory Visit: Payer: Medicare Other | Attending: Physician Assistant

## 2022-10-11 ENCOUNTER — Ambulatory Visit: Payer: Medicare Other

## 2022-10-11 DIAGNOSIS — R0602 Shortness of breath: Secondary | ICD-10-CM | POA: Diagnosis not present

## 2022-10-11 DIAGNOSIS — R079 Chest pain, unspecified: Secondary | ICD-10-CM | POA: Diagnosis not present

## 2022-10-11 LAB — ECHOCARDIOGRAM COMPLETE
AR max vel: 2.72 cm2
AV Area VTI: 2.59 cm2
AV Area mean vel: 2.56 cm2
AV Mean grad: 2 mmHg
AV Peak grad: 3 mmHg
Ao pk vel: 0.87 m/s
Area-P 1/2: 3.26 cm2
Calc EF: 68.3 %
Single Plane A2C EF: 71.3 %
Single Plane A4C EF: 66.4 %

## 2022-10-20 ENCOUNTER — Other Ambulatory Visit: Payer: Self-pay | Admitting: Internal Medicine

## 2022-11-04 ENCOUNTER — Ambulatory Visit: Payer: Medicare Other | Attending: Cardiovascular Disease | Admitting: Cardiovascular Disease

## 2022-11-04 ENCOUNTER — Encounter: Payer: Self-pay | Admitting: Cardiovascular Disease

## 2022-11-04 VITALS — BP 138/74 | HR 88 | Ht 62.0 in | Wt 158.5 lb

## 2022-11-04 DIAGNOSIS — E785 Hyperlipidemia, unspecified: Secondary | ICD-10-CM

## 2022-11-04 DIAGNOSIS — I4821 Permanent atrial fibrillation: Secondary | ICD-10-CM | POA: Diagnosis not present

## 2022-11-04 DIAGNOSIS — I6522 Occlusion and stenosis of left carotid artery: Secondary | ICD-10-CM | POA: Diagnosis not present

## 2022-11-04 DIAGNOSIS — I43 Cardiomyopathy in diseases classified elsewhere: Secondary | ICD-10-CM | POA: Diagnosis not present

## 2022-11-04 DIAGNOSIS — I10 Essential (primary) hypertension: Secondary | ICD-10-CM | POA: Diagnosis not present

## 2022-11-04 DIAGNOSIS — R000 Tachycardia, unspecified: Secondary | ICD-10-CM

## 2022-11-04 MED ORDER — METOPROLOL TARTRATE 50 MG PO TABS: 50.0000 mg | ORAL_TABLET | Freq: Two times a day (BID) | ORAL | 1 refills | Status: DC

## 2022-11-04 NOTE — Progress Notes (Signed)
Cardiology Office Note   Date:  11/04/2022   ID:  NAO LINZ, DOB 1936/07/18, MRN [id number]  PCP:  [name] Mc, MD  Cardiologist:   [name], MD   Chief Complaint  Patient presents with   Other    2 month f/u echo/myoview c/o fluctuating HR. Meds reviewed verbally with pt.       History of Present Illness: [name] [name] is a 86 y.o. female who presents for a followup visit regarding permanent atrial fibrillation.   She has known history of  atrial fibrillation/flutter status post multiple cardioversions in the past but currently with chronic atrial fibrillation being treated with rate control.  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    Most recent ischemic cardiac evaluation was in July 2020 which included a Lexiscan Myoview which showed possible inferolateral ischemia with normal ejection fraction.  Cardiac catheterization was followed in August which showed minimal irregularities with no evidence of obstructive coronary artery disease, normal ejection fraction mildly elevated left ventricular end-diastolic pressure.  Most recent echocardiogram in February 2022 showed normal LV systolic function with an EF of 55 to 60% with mild to moderate pulmonary hypertension and estimated systolic pulmonary pressure of 45 mmHg.  She was seen recently by [facility] for atypical chest pain and increased shortness of breath.  She underwent a Lexiscan Myoview which showed no evidence of ischemia with normal ejection fraction.  Echocardiogram showed normal LV systolic function, mild mitral regurgitation and mild pulmonary hypertension.  She has been doing reasonably well overall with no recent chest pain.  Dyspnea is stable.  Her husband gets concerned about her atrial fibrillation and checks her vitals at night.  If heart rate is relatively low, she skips taking metoprolol.  By next morning, her ventricular rate is usually on the high  side.   Past Medical History:  Diagnosis Date   Actinic keratosis    Arthritis    Atrial flutter ([place]) 02/2011   s/p cardioversion    Chest pain    a. H/o cardiac cath x 2-> 2012 -->nl cors;  b. 12/2016 MV: EF 61%, small region of mild perfusion defect in the apical anteroseptal region c/w breast attenuation, no ischemia-->Low risk; c. 06/2019 MV: Mod size, mild inflat ischemia, EF 59%. Cor and Ao Ca2+. Inflat defect more pronounced on this study compared to last; c. 07/2019 Cath: LM nl, LAD min irregs, LCX nl, OM1/2/3 nl, RCA min irregs.   CKD (chronic kidney disease), stage III ([place])    Concussion with no loss of consciousness 09/01/2016   Cystocele    Degenerative disorder of bone    GERD (gastroesophageal reflux disease)    Headache(784.0)    chronic   Hiatal hernia    Hyperlipidemia    Hypertension    Influenza 01/10/2017   Knee fracture    Migraines    Mobitz type 2 second degree atrioventricular block    a. felt to be 2/2 amiodarone, resolved with decreased amiodarone dose.  Amio since d/c'd.   Permanent atrial fibrillation (HCC)    a. status post multiple DCCVs; b. 2018 - eval for PVI but opted for rate control.   PONV (postoperative nausea and vomiting)    oxycodone and codiene cause N/V    Pre-syncope    a. In setting of dehydration and AKI in the past.   Sleep apnea    Squamous cell carcinoma of skin 11/10/2020   left distal posterior deltoid (EDC 01/15/2021)  Tachycardia induced cardiomyopathy ([place])    a. Resolved;  b. 08/2017 Echo: EF 50-55%, no rwma, mild MR, mildly to mod dil LA/RA; c. 02/2018 Echo: EF 55-60%, mild MR. Mildly dil LA. Nl RVSP. PASP 71mHg.   Venous insufficiency    Vertigo     Past Surgical History:  Procedure Laterality Date   ABDOMINAL HYSTERECTOMY  1990   APPENDECTOMY     AUGMENTATION MAMMAPLASTY Bilateral 1986   implants   AUGMENTATION MAMMAPLASTY  1990   AUGMENTATION MAMMAPLASTY  2011   CARDIAC CATHETERIZATION     CARDIOVERSION     x 3    CARDIOVERSION     CARDIOVERSION N/A 02/07/2017   Procedure: CARDIOVERSION;  Surgeon: MWellington Hampshire MD;  Location: [facility];  Service: Cardiovascular;  Laterality: N/A;   CARDIOVERSION N/A 07/22/2017   Procedure: Cardioversion;  Surgeon: GMinna Merritts MD;  Location: [facility];  Service: Cardiovascular;  Laterality: N/A;   CATARACT EXTRACTION W/PHACO Right 09/21/2016   Procedure: CATARACT EXTRACTION PHACO AND INTRAOCULAR LENS PLACEMENT ([place];  Surgeon: WBirder Robson MD;  Location: [facility];  Service: Ophthalmology;  Laterality: Right;  [place]44.[place] AP% 16.5 CDE 7.30 Fluid Pack Lot ##[id number]   CATARACT EXTRACTION W/PHACO Left 10/19/2016   Procedure: CATARACT EXTRACTION PHACO AND INTRAOCULAR LENS PLACEMENT (IOC);  Surgeon: WBirder Robson MD;  Location: [facility];  Service: Ophthalmology;  Laterality: Left;  [place]53.7 AP% 19.5 CDE 10.45 Fluid pack lot # [id number]   CHOLECYSTECTOMY     COMBINED AUGMENTATION MAMMAPLASTY AND ABDOMINOPLASTY     JOINT REPLACEMENT Left 06/04/2013   left knee   KNEE ARTHROSCOPY Right 08/16/2016   Procedure: ARTHROSCOPY KNEE, tear posterior horn medial meniscus, tear anterior and posterior horns of lateral meniscus, chondromalacia of lateral compartment grade 3 patella and grade 4 medial;  Surgeon: JDereck Leep MD;  Location: [facility] OR[facility];  Service: Orthopedics;  Laterality: Right;   LEFT HEART CATH AND CORONARY ANGIOGRAPHY Left 07/27/2019   Procedure: LEFT HEART CATH AND CORONARY ANGIOGRAPHY;  Surgeon: AWellington Hampshire MD;  [facility];  Service: Cardiovascular;  Laterality: Left;   MASTECTOMY  1986   nipple sparing mastectomy/Bilateral with silicone  breast implants, s/p saline replacements   Multiple orthopedic procedures     NOSE SURGERY     TEE WITHOUT CARDIOVERSION N/A 09/26/2017   Procedure: TRANSESOPHAGEAL ECHOCARDIOGRAM (TEE);  Surgeon: RFay Records MD;  [facility];  Service: Cardiovascular;  Laterality: N/A;   TOTAL KNEE  ARTHROPLASTY Left      Current Outpatient Medications  Medication Sig Dispense Refill   acetaminophen (TYLENOL) 500 MG tablet Take 1,000 mg by mouth every 6 (six) hours as needed (pain).      aluminum-magnesium hydroxide 200-200 MG/5ML suspension Take 20 mLs by mouth every 6 (six) hours as needed for indigestion.      apixaban (ELIQUIS) 5 MG TABS tablet Take 1 tablet (5 mg total) by mouth 2 (two) times daily. 60 tablet 6   Carboxymethylcellul-Glycerin (LUBRICATING EYE DROPS OP) Place 1 drop into both eyes daily as needed (dry eyes).     chlorhexidine (PERIDEX) 0.12 % solution Use as directed in the mouth or throat.     cholecalciferol (VITAMIN D) 1000 units tablet Take 1,000 Units by mouth daily.     Coenzyme Q10 (COQ10) 200 MG CAPS Take 200 mg by mouth daily.     diltiazem (CARDIZEM CD) 180 MG 24 hr capsule TAKE 1 CAPSULE (180 MG) BY MOUTH EVERY EVENING 90 capsule 1  pantoprazole (PROTONIX) 40 MG tablet TAKE 1 TABLET BY MOUTH DAILY 90 tablet 1   pravastatin (PRAVACHOL) 20 MG tablet Take 1 tablet (20 mg total) by mouth daily. 90 tablet 2   Probiotic Product (PROBIOTIC ADVANCED PO) Take 1 tablet by mouth daily.     sodium chloride (OCEAN) 0.65 % SOLN nasal spray Place 1 spray into both nostrils daily as needed for congestion.     metoprolol tartrate (LOPRESSOR) 50 MG tablet Take 1 tablet (50 mg total) by mouth 2 (two) times daily. 180 tablet 1   No current facility-administered medications for this visit.    Allergies:   Iodine, Amiodarone, Biaxin [clarithromycin], Codeine, Digoxin and related, Enalapril, Famotidine, Fluocinonide, Iodinated contrast media, Iodine-131, Losartan potassium, Omnicef [cefdinir], Oxycodone, Promethazine, Sertraline, Tizanidine hcl, Warfarin and related, and Xarelto [rivaroxaban]    Social History:  The patient  reports that she has never smoked. She has never used smokeless tobacco. She reports that she does not drink alcohol and does not use drugs.   Family  History:  The patient's family history includes Breast cancer in her maternal grandmother and sister; Breast cancer (age of onset: 83) in her sister; Breast cancer (age of onset: 93) in her sister; Breast cancer (age of onset: 19) in her sister; Diabetes in her brother; Esophageal cancer in her brother; Heart disease in her mother and son; Kidney failure in her brother; Stomach cancer in her father.    ROS:  Please see the history of present illness.   Otherwise, review of systems are positive for none.   All other systems are reviewed and negative.    PHYSICAL EXAM: VS:  BP 138/74 (BP Location: Left Arm, Patient Position: Sitting, Cuff Size: Normal)   Pulse 88   Ht '5\' 2"'$  (1.575 m)   Wt 158 lb 8 oz (71.9 kg)   SpO2 97%   BMI 28.99 kg/m  , BMI Body mass index is 28.99 kg/m. GEN: Well nourished, well developed, in no acute distress  HEENT: Left temporal lobe tenderness Neck: no JVD, carotid bruits, or masses Cardiac: Irregularly irregular; no murmurs, rubs, or gallops,no edema  Respiratory:  clear to auscultation bilaterally, normal work of breathing GI: soft, nontender, nondistended, + BS MS: no deformity or atrophy  Skin: warm and dry, no rash Neuro:  Strength and sensation are intact Psych: euthymic mood, full affect   EKG:  EKG is ordered today. The ekg ordered today demonstrates atrial fibrillation with right bundle branch block and inferior T wave changes.   Recent Labs: 04/15/2022: Hemoglobin 13.4; Platelets 274.0; Pro B Natriuretic peptide (BNP) 159.0 05/26/2022: ALT 18; BUN 12; Creatinine, Ser 0.71; Potassium 4.5; Sodium 131; TSH 0.81    Lipid Panel    Component Value Date/Time   CHOL 146 05/26/2022 1118   TRIG 57.0 05/26/2022 1118   HDL 68.00 05/26/2022 1118   CHOLHDL 2 05/26/2022 1118   VLDL 11.4 05/26/2022 1118   LDLCALC 67 05/26/2022 1118      Wt Readings from Last 3 Encounters:  11/04/22 158 lb 8 oz (71.9 kg)  08/30/22 159 lb (72.1 kg)  08/10/22 159 lb 6.4  oz (72.3 kg)       ASSESSMENT AND PLAN:  1.  Permanent atrial fibrillation: I explained to her that it is normal for her heart rate to fluctuate with atrial fibrillation.  She should not skip a dose of metoprolol based on 1 reading.  She does not seem to be using the midday metoprolol dose most of the  time.  In order to simplify her medications, I change metoprolol to 50 mg twice daily.  Continue diltiazem.  I asked her to take these medications on a regular basis and not as needed.   Continue anticoagulation with Eliquis.   2. Essential hypertension: Blood pressure is well controlled.  3. History of tachycardia-induced cardiomyopathy.  I reviewed the results of recent echocardiogram with her which showed normal LV systolic function.  In addition, Lexiscan Myoview was normal.  4. Left carotid calcifications: Carotid Doppler in August of 2018 showed mild nonobstructive disease.  5.  Hyperlipidemia: She continues to take pravastatin.  I reviewed her recent lipid profile which showed an LDL of 67.   Disposition:   FU with me in 6 months    Signed, [name], MD 11/04/22 [place], [place]

## 2022-11-04 NOTE — Patient Instructions (Signed)
Medication Instructions:  DECREASE Metoprolol Tartrate to 50 mg twice daily  *If you need a refill on your cardiac medications before your next appointment, please call your pharmacy*   Lab Work: None ordered If you have labs (blood work) drawn today and your tests are completely normal, you will receive your results only by: [place] (if you have MyChart) OR A paper copy in the mail If you have any lab test that is abnormal or we need to change your treatment, we will call you to review the results.   Testing/Procedures: None ordered   Follow-Up: At [facility], you and your health needs are our priority.  As part of our continuing mission to provide you with exceptional heart care, we have created designated Provider Care Teams.  These Care Teams include your primary Cardiologist (physician) and Advanced Practice Providers (APPs -  Physician Assistants and Nurse Practitioners) who all work together to provide you with the care you need, when you need it.  We recommend signing up for the patient portal called "MyChart".  Sign up information is provided on this After Visit Summary.  MyChart is used to connect with patients for Virtual Visits (Telemedicine).  Patients are able to view lab/test results, encounter notes, upcoming appointments, etc.  Non-urgent messages can be sent to your provider as well.   To learn more about what you can do with MyChart, go to [email or web address].    Your next appointment:   6 month(s)  The format for your next appointment:   In Person  Provider:   You may see [name], MD or one of the following Advanced Practice Providers on your designated Care Team:   Murray Hodgkins, NP Christell Faith, PA-C Cadence Kathlen Mody, PA-C Gerrie Nordmann, NP    Important Information About Sugar

## 2022-11-06 ENCOUNTER — Other Ambulatory Visit: Payer: Self-pay | Admitting: Cardiovascular Disease

## 2022-11-06 DIAGNOSIS — I10 Essential (primary) hypertension: Secondary | ICD-10-CM

## 2022-11-06 DIAGNOSIS — I4821 Permanent atrial fibrillation: Secondary | ICD-10-CM

## 2022-11-16 ENCOUNTER — Other Ambulatory Visit: Payer: Self-pay | Admitting: Cardiovascular Disease

## 2022-11-24 ENCOUNTER — Other Ambulatory Visit: Payer: Medicare Other

## 2022-11-25 DIAGNOSIS — M9904 Segmental and somatic dysfunction of sacral region: Secondary | ICD-10-CM | POA: Diagnosis not present

## 2022-11-25 DIAGNOSIS — M9905 Segmental and somatic dysfunction of pelvic region: Secondary | ICD-10-CM | POA: Diagnosis not present

## 2022-11-25 DIAGNOSIS — M9903 Segmental and somatic dysfunction of lumbar region: Secondary | ICD-10-CM | POA: Diagnosis not present

## 2022-11-25 DIAGNOSIS — M9901 Segmental and somatic dysfunction of cervical region: Secondary | ICD-10-CM | POA: Diagnosis not present

## 2022-11-29 DIAGNOSIS — M9903 Segmental and somatic dysfunction of lumbar region: Secondary | ICD-10-CM | POA: Diagnosis not present

## 2022-11-29 DIAGNOSIS — M9904 Segmental and somatic dysfunction of sacral region: Secondary | ICD-10-CM | POA: Diagnosis not present

## 2022-11-29 DIAGNOSIS — M9901 Segmental and somatic dysfunction of cervical region: Secondary | ICD-10-CM | POA: Diagnosis not present

## 2022-11-29 DIAGNOSIS — M9905 Segmental and somatic dysfunction of pelvic region: Secondary | ICD-10-CM | POA: Diagnosis not present

## 2022-12-29 ENCOUNTER — Encounter: Payer: Self-pay | Admitting: Internal Medicine

## 2022-12-29 ENCOUNTER — Ambulatory Visit (INDEPENDENT_AMBULATORY_CARE_PROVIDER_SITE_OTHER): Payer: Medicare Other | Admitting: Internal Medicine

## 2022-12-29 VITALS — BP 136/86 | HR 118 | Temp 98.0°F | Ht 62.0 in | Wt 161.2 lb

## 2022-12-29 DIAGNOSIS — R4189 Other symptoms and signs involving cognitive functions and awareness: Secondary | ICD-10-CM | POA: Diagnosis not present

## 2022-12-29 DIAGNOSIS — L603 Nail dystrophy: Secondary | ICD-10-CM

## 2022-12-29 DIAGNOSIS — E119 Type 2 diabetes mellitus without complications: Secondary | ICD-10-CM

## 2022-12-29 DIAGNOSIS — R7303 Prediabetes: Secondary | ICD-10-CM | POA: Diagnosis not present

## 2022-12-29 DIAGNOSIS — E7800 Pure hypercholesterolemia, unspecified: Secondary | ICD-10-CM | POA: Diagnosis not present

## 2022-12-29 DIAGNOSIS — R419 Unspecified symptoms and signs involving cognitive functions and awareness: Secondary | ICD-10-CM

## 2022-12-29 DIAGNOSIS — I10 Essential (primary) hypertension: Secondary | ICD-10-CM

## 2022-12-29 NOTE — Patient Instructions (Addendum)
YOU NEED A DIABETIC EYE EXAM ONCE A YEAR   I am referring you to  [facility] to take care of your feet   We are repeating bloodwork today

## 2022-12-29 NOTE — Progress Notes (Unsigned)
Subjective:  Patient ID: [name] [name], female    DOB: 1936/06/01  Age: 87 y.o. MRN: [id number]  CC: The primary encounter diagnosis was Essential hypertension. Diagnoses of Pure hypercholesterolemia, Prediabetes, Dystrophic nail, Cognitive changes, Diet-controlled diabetes mellitus ([place]), and Cognitive complaints with normal exam were also pertinent to this visit.   HPI [name] [name] presents for  Chief Complaint  Patient presents with   Medical Management of Chronic Issues    6 month follow up   1) new onset Type 2 DM diagnosed in June with A1c OF 6.5 .  Fasting glucoses have not been diagnostic. Does not exercise regularly.  Diet reviewed   2) Cognitive changes reported by husband,  described as apraxia,  name forgetting.   Couldn't remember how to start the car, has forgotten grandson's name . No kitchen mishaps,  no getting lost.  No word finding problems.  Manages her own medications   Outpatient Medications Prior to Visit  Medication Sig Dispense Refill   acetaminophen (TYLENOL) 500 MG tablet Take 1,000 mg by mouth every 6 (six) hours as needed (pain).      aluminum-magnesium hydroxide 200-200 MG/5ML suspension Take 20 mLs by mouth every 6 (six) hours as needed for indigestion.      apixaban (ELIQUIS) 5 MG TABS tablet Take 1 tablet (5 mg total) by mouth 2 (two) times daily. 60 tablet 6   Carboxymethylcellul-Glycerin (LUBRICATING EYE DROPS OP) Place 1 drop into both eyes daily as needed (dry eyes).     chlorhexidine (PERIDEX) 0.12 % solution Use as directed in the mouth or throat.     cholecalciferol (VITAMIN D) 1000 units tablet Take 1,000 Units by mouth daily.     Coenzyme Q10 (COQ10) 200 MG CAPS Take 200 mg by mouth daily.     diltiazem (CARDIZEM CD) 180 MG 24 hr capsule TAKE 1 CAPSULE (180 MG) BY MOUTH EVERY EVENING 90 capsule 1   metoprolol tartrate (LOPRESSOR) 50 MG tablet Take 1 tablet (50 mg total) by mouth 2 (two) times daily. 180 tablet 1   pantoprazole (PROTONIX) 40  MG tablet TAKE 1 TABLET BY MOUTH DAILY 90 tablet 1   pravastatin (PRAVACHOL) 20 MG tablet TAKE 1 TABLET BY MOUTH DAILY 90 tablet 1   Probiotic Product (PROBIOTIC ADVANCED PO) Take 1 tablet by mouth daily.     sodium chloride (OCEAN) 0.65 % SOLN nasal spray Place 1 spray into both nostrils daily as needed for congestion.     No facility-administered medications prior to visit.    Review of Systems;  Patient denies headache, fevers, malaise, unintentional weight loss, skin rash, eye pain, sinus congestion and sinus pain, sore throat, dysphagia,  hemoptysis , cough, dyspnea, wheezing, chest pain, palpitations, orthopnea, edema, abdominal pain, nausea, melena, diarrhea, constipation, flank pain, dysuria, hematuria, urinary  Frequency, nocturia, numbness, tingling, seizures,  Focal weakness, Loss of consciousness,  Tremor, insomnia, depression, anxiety, and suicidal ideation.      Objective:  BP 136/86   Pulse (!) 118   Temp 98 F (36.7 C) (Oral)   Ht '5\' 2"'$  (1.575 m)   Wt 161 lb 3.2 oz (73.1 kg)   SpO2 95%   BMI 29.48 kg/m   BP Readings from Last 3 Encounters:  12/29/22 136/86  11/04/22 138/74  08/30/22 120/70    Wt Readings from Last 3 Encounters:  12/29/22 161 lb 3.2 oz (73.1 kg)  11/04/22 158 lb 8 oz (71.9 kg)  08/30/22 159 lb (72.1 kg)    Physical  Exam  Lab Results  Component Value Date   HGBA1C 6.5 12/29/2022   HGBA1C 6.5 05/26/2022   HGBA1C 6.0 (A) 02/04/2021    Lab Results  Component Value Date   CREATININE 0.77 12/29/2022   CREATININE 0.71 05/26/2022   CREATININE 0.81 04/15/2022    Lab Results  Component Value Date   WBC 6.8 04/15/2022   HGB 13.4 04/15/2022   HCT 40.8 04/15/2022   PLT 274.0 04/15/2022   GLUCOSE 97 12/29/2022   CHOL 149 12/29/2022   TRIG 65.0 12/29/2022   HDL 66.10 12/29/2022   LDLDIRECT 72.0 12/29/2022   LDLCALC 70 12/29/2022   ALT 20 12/29/2022   AST 22 12/29/2022   NA 135 12/29/2022   K 4.4 12/29/2022   CL 99 12/29/2022    CREATININE 0.77 12/29/2022   BUN 11 12/29/2022   CO2 28 12/29/2022   TSH 0.60 12/29/2022   INR 1.3 02/12/2019   HGBA1C 6.5 12/29/2022   MICROALBUR 8.3 05/25/2017    NM Myocar Multi W/Spect W/Wall Motion / EF  Result Date: 09/09/2022 Pharmacological myocardial perfusion imaging study with no significant  ischemia Normal wall motion, EF estimated at 57% No EKG changes concerning for ischemia at peak stress or in recovery. CT attenuation correction images with moderate aortic atherosclerosis, mild coronary calcification Low risk scan Signed, Esmond Plants, MD, Ph.D [facility]    Assessment & Plan:  .Essential hypertension -     Comprehensive metabolic panel -     Microalbumin / creatinine urine ratio  Pure hypercholesterolemia -     Lipid panel -     LDL cholesterol, direct  Prediabetes Assessment & Plan: Diet and lifestyle reviewed.  Standards of care for diabetes management reviewed..vaccinations reviewed.   Feet are examined today and show normal  sensation to light touch   I have recommended a low glycemic index diet regular participation in aerobic exercise with a goal of 30 minutes of aerobic exercise a minimum of 3 days per week. Return in 3 months.   Lab Results  Component Value Date   HGBA1C 6.5 12/29/2022   Lab Results  Component Value Date   MICROALBUR 8.3 05/25/2017   MICROALBUR <0.7 04/08/2017         Orders: -     Hemoglobin A1c -     Comprehensive metabolic panel -     Microalbumin / creatinine urine ratio  Dystrophic nail -     Ambulatory referral to Podiatry  Cognitive changes -     B12 and Folate Panel -     TSH  Diet-controlled diabetes mellitus ([place]) Assessment & Plan: Diet and lifestyle reviewed.  Standards of care for diabetes management reviewed..vaccinations reviewed.   Feet are examined today and show normal  sensation to light touch   I have recommended a low glycemic index diet regular participation in aerobic exercise with a goal of 30  minutes of aerobic exercise a minimum of 3 days per week. Return in 3 months.   Lab Results  Component Value Date   HGBA1C 6.5 12/29/2022   Lab Results  Component Value Date   MICROALBUR 8.3 05/25/2017   MICROALBUR <0.7 04/08/2017          Cognitive complaints with normal exam Assessment & Plan: Complaints are limited to tasks and names.  MMSE is 28/30  Previous cerebral imaging reviewed.       Follow-up: Return in about 3 months (around 03/30/2023).   [name], MD

## 2022-12-30 LAB — LIPID PANEL
Cholesterol: 149 mg/dL (ref 0–200)
HDL: 66.1 mg/dL (ref >39.00)
LDL Cholesterol: 70 mg/dL (ref 0–99)
NonHDL: 83.04
Total CHOL/HDL Ratio: 2
Triglycerides: 65 mg/dL (ref 0.0–149.0)
VLDL: 13 mg/dL (ref 0.0–40.0)

## 2022-12-30 LAB — B12 AND FOLATE PANEL
Folate: >23.8 ng/mL (ref >5.9)
Vitamin B-12: 418 pg/mL (ref 211–911)

## 2022-12-30 LAB — COMPREHENSIVE METABOLIC PANEL
ALT: 20 U/L (ref 0–35)
AST: 22 U/L (ref 0–37)
Albumin: 4.5 g/dL (ref 3.5–5.2)
Alkaline Phosphatase: 87 U/L (ref 39–117)
BUN: 11 mg/dL (ref 6–23)
CO2: 28 mEq/L (ref 19–32)
Calcium: 9.7 mg/dL (ref 8.4–10.5)
Chloride: 99 mEq/L (ref 96–112)
Creatinine, Ser: 0.77 mg/dL (ref 0.40–1.20)
GFR: 70.57 mL/min (ref >60.00)
Glucose, Bld: 97 mg/dL (ref 70–99)
Potassium: 4.4 mEq/L (ref 3.5–5.1)
Sodium: 135 mEq/L (ref 135–145)
Total Bilirubin: 1.3 mg/dL — ABNORMAL HIGH (ref 0.2–1.2)
Total Protein: 7.6 g/dL (ref 6.0–8.3)

## 2022-12-30 LAB — TSH: TSH: 0.6 u[IU]/mL (ref 0.35–5.50)

## 2022-12-30 LAB — HEMOGLOBIN A1C: Hgb A1c MFr Bld: 6.5 % (ref 4.6–6.5)

## 2022-12-30 LAB — LDL CHOLESTEROL, DIRECT: Direct LDL: 72 mg/dL

## 2022-12-30 NOTE — Assessment & Plan Note (Signed)
Complaints are limited to tasks and names.  MMSE is 28/30  Previous cerebral imaging reviewed.

## 2022-12-30 NOTE — Addendum Note (Signed)
Addended by: Neta Ehlers on: 12/30/2022 03:53 PM   Modules accepted: Orders

## 2022-12-30 NOTE — Assessment & Plan Note (Addendum)
Diet and lifestyle reviewed.  Standards of care for diabetes management reviewed..vaccinations reviewed.   Feet are examined today and show normal  sensation to light touch   I have recommended a low glycemic index diet regular participation in aerobic exercise with a goal of 30 minutes of aerobic exercise a minimum of 3 days per week. Return in 3 months.   Lab Results  Component Value Date   HGBA1C 6.5 12/29/2022   Lab Results  Component Value Date   MICROALBUR 8.3 05/25/2017   MICROALBUR <0.7 04/08/2017

## 2023-01-27 ENCOUNTER — Ambulatory Visit: Payer: Medicare Other | Admitting: Dermatology

## 2023-01-27 VITALS — BP 121/68 | HR 88

## 2023-01-27 DIAGNOSIS — Z1283 Encounter for screening for malignant neoplasm of skin: Secondary | ICD-10-CM

## 2023-01-27 DIAGNOSIS — D692 Other nonthrombocytopenic purpura: Secondary | ICD-10-CM | POA: Diagnosis not present

## 2023-01-27 DIAGNOSIS — I872 Venous insufficiency (chronic) (peripheral): Secondary | ICD-10-CM | POA: Diagnosis not present

## 2023-01-27 DIAGNOSIS — L814 Other melanin hyperpigmentation: Secondary | ICD-10-CM

## 2023-01-27 DIAGNOSIS — L570 Actinic keratosis: Secondary | ICD-10-CM | POA: Diagnosis not present

## 2023-01-27 DIAGNOSIS — L820 Inflamed seborrheic keratosis: Secondary | ICD-10-CM

## 2023-01-27 DIAGNOSIS — L578 Other skin changes due to chronic exposure to nonionizing radiation: Secondary | ICD-10-CM | POA: Diagnosis not present

## 2023-01-27 DIAGNOSIS — Z8589 Personal history of malignant neoplasm of other organs and systems: Secondary | ICD-10-CM

## 2023-01-27 DIAGNOSIS — Z85828 Personal history of other malignant neoplasm of skin: Secondary | ICD-10-CM | POA: Diagnosis not present

## 2023-01-27 DIAGNOSIS — L821 Other seborrheic keratosis: Secondary | ICD-10-CM | POA: Diagnosis not present

## 2023-01-27 NOTE — Patient Instructions (Addendum)
Cryotherapy Aftercare  Wash gently with soap and water everyday.   Apply Vaseline and Band-Aid daily until healed.     Due to recent changes in healthcare laws, you may see results of your pathology and/or laboratory studies on MyChart before the doctors have had a chance to review them. We understand that in some cases there may be results that are confusing or concerning to you. Please understand that not all results are received at the same time and often the doctors may need to interpret multiple results in order to provide you with the best plan of care or course of treatment. Therefore, we ask that you please give us 2 business days to thoroughly review all your results before contacting the office for clarification. Should we see a critical lab result, you will be contacted sooner.   If You Need Anything After Your Visit  If you have any questions or concerns for your doctor, please call our main line at [phone] and press option 4 to reach your doctor's medical assistant. If no one answers, please leave a voicemail as directed and we will return your call as soon as possible. Messages left after 4 pm will be answered the following business day.   You may also send us a message via MyChart. We typically respond to MyChart messages within 1-2 business days.  For prescription refills, please ask your pharmacy to contact our office. Our fax number is [phone].  If you have an urgent issue when the clinic is closed that cannot wait until the next business day, you can page your doctor at the number below.    Please note that while we do our best to be available for urgent issues outside of office hours, we are not available 24/7.   If you have an urgent issue and are unable to reach us, you may choose to seek medical care at your doctor's office, retail clinic, urgent care center, or emergency room.  If you have a medical emergency, please immediately call 911 or go to the  emergency department.  Pager Numbers  - Dr. Kowalski: [phone]  - Dr. Moye: [phone]  - Dr. Stewart: [phone]  In the event of inclement weather, please call our main line at [phone] for an update on the status of any delays or closures.  Dermatology Medication Tips: Please keep the boxes that topical medications come in in order to help keep track of the instructions about where and how to use these. Pharmacies typically print the medication instructions only on the boxes and not directly on the medication tubes.   If your medication is too expensive, please contact our office at [phone] option 4 or send us a message through MyChart.   We are unable to tell what your co-pay for medications will be in advance as this is different depending on your insurance coverage. However, we may be able to find a substitute medication at lower cost or fill out paperwork to get insurance to cover a needed medication.   If a prior authorization is required to get your medication covered by your insurance company, please allow us 1-2 business days to complete this process.  Drug prices often vary depending on where the prescription is filled and some pharmacies may offer cheaper prices.  The website www.goodrx.com contains coupons for medications through different pharmacies. The prices here do not account for what the cost may be with help from insurance (it may be cheaper with your insurance), but the website can   give you the price if you did not use any insurance.  - You can print the associated coupon and take it with your prescription to the pharmacy.  - You may also stop by our office during regular business hours and pick up a GoodRx coupon card.  - If you need your prescription sent electronically to a different pharmacy, notify our office through [facility] MyChart or by phone at [phone] option 4.     Si Usted Necesita Algo Despus de Su Visita  Tambin puede  enviarnos un mensaje a travs de MyChart. Por lo general respondemos a los mensajes de MyChart en el transcurso de 1 a 2 das hbiles.  Para renovar recetas, por favor pida a su farmacia que se ponga en contacto con nuestra oficina. Nuestro nmero de fax es el [phone].  Si tiene un asunto urgente cuando la clnica est cerrada y que no puede esperar hasta el siguiente da hbil, puede llamar/localizar a su doctor(a) al nmero que aparece a continuacin.   Por favor, tenga en cuenta que aunque hacemos todo lo posible para estar disponibles para asuntos urgentes fuera del horario de oficina, no estamos disponibles las 24 horas del da, los 7 das de la semana.   Si tiene un problema urgente y no puede comunicarse con nosotros, puede optar por buscar atencin mdica  en el consultorio de su doctor(a), en una clnica privada, en un centro de atencin urgente o en una sala de emergencias.  Si tiene una emergencia mdica, por favor llame inmediatamente al 911 o vaya a la sala de emergencias.  Nmeros de bper  - Dr. Kowalski: [phone]  - Dra. Moye: [phone]  - Dra. Stewart: [phone]  En caso de inclemencias del tiempo, por favor llame a nuestra lnea principal al [phone] para una actualizacin sobre el estado de cualquier retraso o cierre.  Consejos para la medicacin en dermatologa: Por favor, guarde las cajas en las que vienen los medicamentos de uso tpico para ayudarle a seguir las instrucciones sobre dnde y cmo usarlos. Las farmacias generalmente imprimen las instrucciones del medicamento slo en las cajas y no directamente en los tubos del medicamento.   Si su medicamento es muy caro, por favor, pngase en contacto con nuestra oficina llamando al [phone] y presione la opcin 4 o envenos un mensaje a travs de MyChart.   No podemos decirle cul ser su copago por los medicamentos por adelantado ya que esto es diferente dependiendo de la cobertura de su seguro.  Sin embargo, es posible que podamos encontrar un medicamento sustituto a menor costo o llenar un formulario para que el seguro cubra el medicamento que se considera necesario.   Si se requiere una autorizacin previa para que su compaa de seguros cubra su medicamento, por favor permtanos de 1 a 2 das hbiles para completar este proceso.  Los precios de los medicamentos varan con frecuencia dependiendo del lugar de dnde se surte la receta y alguna farmacias pueden ofrecer precios ms baratos.  El sitio web www.goodrx.com tiene cupones para medicamentos de diferentes farmacias. Los precios aqu no tienen en cuenta lo que podra costar con la ayuda del seguro (puede ser ms barato con su seguro), pero el sitio web puede darle el precio si no utiliz ningn seguro.  - Puede imprimir el cupn correspondiente y llevarlo con su receta a la farmacia.  - Tambin puede pasar por nuestra oficina durante el horario de atencin regular y recoger una tarjeta de cupones de GoodRx.  -   Si necesita que su receta se enve electrnicamente a una farmacia diferente, informe a nuestra oficina a travs de MyChart de [facility] o por telfono llamando al [phone] y presione la opcin 4.  

## 2023-01-27 NOTE — Progress Notes (Signed)
Follow-Up Visit   Subjective  [name] [name] is a 87 y.o. female who presents for the following: Follow-up. Hx of SCC The patient presents for Upper Body Skin Exam (UBSE) for skin cancer screening and mole check.  The patient has spots, moles and lesions to be evaluated, some may be new or changing and the patient has concerns that these could be cancer.   Husband with patient   The following portions of the chart were reviewed this encounter and updated as appropriate:   Tobacco  Allergies  Meds  Problems  Med Hx  Surg Hx  Fam Hx     Review of Systems:  No other skin or systemic complaints except as noted in HPI or Assessment and Plan.  Objective  Well appearing patient in no apparent distress; mood and affect are within normal limits.  A focused examination was performed including face,arms,legs. Relevant physical exam findings are noted in the Assessment and Plan.upper body skin exam performed.  arms, legs, face x 19 (19) Stuck-on, waxy, tan-brown papules -- Discussed benign etiology and prognosis.   right temple x 1 Erythematous thin papules/macules with gritty scale.   Right Lower Leg - Anterior Erythematous, scaly patches involving the ankle and distal lower leg with associated lower leg edema.    Assessment & Plan  Inflamed seborrheic keratosis (19) arms, legs, face x 19 Symptomatic, irritating, patient would like treated.  Destruction of lesion - arms, legs, face x 19 Complexity: simple   Destruction method: cryotherapy   Informed consent: discussed and consent obtained   Timeout:  patient name, date of birth, surgical site, and procedure verified Lesion destroyed using liquid nitrogen: Yes   Region frozen until ice ball extended beyond lesion: Yes   Outcome: patient tolerated procedure well with no complications   Post-procedure details: wound care instructions given    AK (actinic keratosis) right temple x 1 Actinic keratoses are precancerous spots  that appear secondary to cumulative UV radiation exposure/sun exposure over time. They are chronic with expected duration over 1 year. A portion of actinic keratoses will progress to squamous cell carcinoma of the skin. It is not possible to reliably predict which spots will progress to skin cancer and so treatment is recommended to prevent development of skin cancer.  Recommend daily broad spectrum sunscreen SPF 30+ to sun-exposed areas, reapply every 2 hours as needed.  Recommend staying in the shade or wearing long sleeves, sun glasses (UVA+UVB protection) and wide brim hats (4-inch brim around the entire circumference of the hat). Call for new or changing lesions.   Destruction of lesion - right temple x 1 Complexity: simple   Destruction method: cryotherapy   Informed consent: discussed and consent obtained   Timeout:  patient name, date of birth, surgical site, and procedure verified Lesion destroyed using liquid nitrogen: Yes   Region frozen until ice ball extended beyond lesion: Yes   Outcome: patient tolerated procedure well with no complications   Post-procedure details: wound care instructions given    Stasis dermatitis of both legs Right Lower Leg - Anterior Stasis dermatitis with Schamberg's purpura  Stasis in the legs causes chronic leg swelling, which may result in itchy or painful rashes, skin discoloration, skin texture changes, and sometimes ulceration.  Recommend daily graduated compression hose/stockings- easiest to put on first thing in morning, remove at bedtime.  Elevate legs as much as possible. Avoid salt/sodium rich foods.   Lentigines - Scattered tan macules - Due to sun exposure - Benign-appearing,  observe - Recommend daily broad spectrum sunscreen SPF 30+ to sun-exposed areas, reapply every 2 hours as needed. - Call for any changes   Seborrheic Keratoses - Stuck-on, waxy, tan-brown papules and/or plaques  - Benign-appearing - Discussed benign etiology and  prognosis. - Observe - Call for any changes  Actinic Damage - chronic, secondary to cumulative UV radiation exposure/sun exposure over time - diffuse scaly erythematous macules with underlying dyspigmentation - Recommend daily broad spectrum sunscreen SPF 30+ to sun-exposed areas, reapply every 2 hours as needed.  - Recommend staying in the shade or wearing long sleeves, sun glasses (UVA+UVB protection) and wide brim hats (4-inch brim around the entire circumference of the hat). - Call for new or changing lesions.    Purpura - Chronic; persistent and recurrent.  Treatable, but not curable. - Violaceous macules and patches - Benign - Related to trauma, age, sun damage and/or use of blood thinners, chronic use of topical and/or oral steroids - Observe - Can use OTC arnica containing moisturizer such as Dermend Bruise Formula if desired - Call for worsening or other concerns   History of Squamous Cell Carcinoma of the Skin - No evidence of recurrence today - No lymphadenopathy - Recommend regular full body skin exams - Recommend daily broad spectrum sunscreen SPF 30+ to sun-exposed areas, reapply every 2 hours as needed.  - Call if any new or changing lesions are noted between office visits   Return in about 1 year (around 01/28/2024) for TBSE, hx of SCC.  IMarye Round, CMA, am acting as scribe for Sarina Ser, MD .  Documentation: I have reviewed the above documentation for accuracy and completeness, and I agree with the above.  Sarina Ser, MD

## 2023-02-07 ENCOUNTER — Encounter: Payer: Self-pay | Admitting: Dermatology

## 2023-02-07 ENCOUNTER — Other Ambulatory Visit: Payer: Self-pay | Admitting: Cardiovascular Disease

## 2023-02-07 DIAGNOSIS — I4821 Permanent atrial fibrillation: Secondary | ICD-10-CM

## 2023-02-07 DIAGNOSIS — I10 Essential (primary) hypertension: Secondary | ICD-10-CM

## 2023-02-21 DIAGNOSIS — N182 Chronic kidney disease, stage 2 (mild): Secondary | ICD-10-CM | POA: Diagnosis not present

## 2023-02-21 DIAGNOSIS — I10 Essential (primary) hypertension: Secondary | ICD-10-CM | POA: Diagnosis not present

## 2023-02-28 ENCOUNTER — Encounter: Payer: Self-pay | Admitting: Podiatry

## 2023-02-28 ENCOUNTER — Ambulatory Visit: Payer: Medicare Other | Admitting: Podiatry

## 2023-02-28 VITALS — BP 142/80 | HR 79

## 2023-02-28 DIAGNOSIS — Z7901 Long term (current) use of anticoagulants: Secondary | ICD-10-CM

## 2023-02-28 DIAGNOSIS — Q828 Other specified congenital malformations of skin: Secondary | ICD-10-CM | POA: Diagnosis not present

## 2023-02-28 DIAGNOSIS — M79674 Pain in right toe(s): Secondary | ICD-10-CM

## 2023-02-28 DIAGNOSIS — B351 Tinea unguium: Secondary | ICD-10-CM

## 2023-02-28 DIAGNOSIS — M79675 Pain in left toe(s): Secondary | ICD-10-CM | POA: Diagnosis not present

## 2023-03-01 ENCOUNTER — Encounter: Payer: Self-pay | Admitting: Podiatry

## 2023-03-01 NOTE — Progress Notes (Signed)
  Subjective:  Patient ID: [name] [name], female    DOB: March 08, 1936,  MRN: [id number]  Chief Complaint  Patient presents with   Nail Problem    "I need my toenails cut and I have a callus on the bottom of my foot on the left foot." N - toenails and callus L - bilateral 1-5 toenails, callus - left D - 2-4 mos toenails, callus - 1 yr  O - gradually gotten worse C - they hurt, they curl,  A - shoes T - soak them, put pad over the callus    87 y.o. female presents with the above complaint. History confirmed with patient.   Objective:  Physical Exam: warm, good capillary refill, no trophic changes or ulcerative lesions, normal DP and PT pulses, and normal sensory exam. Left Foot: dystrophic yellowed discolored nail plates with subungual debris and large porokeratosis noted submetatarsal 3 Right Foot: dystrophic yellowed discolored nail plates with subungual debris    Assessment:   1. Porokeratosis   2. Chronic anticoagulation   3. Pain due to onychomycosis of toenails of both feet      Plan:  Patient was evaluated and treated and all questions answered.  Discussed the etiology and treatment options for the condition in detail with the patient. Educated patient on the topical and oral treatment options for mycotic nails. Recommended debridement of the nails today. Sharp and mechanical debridement performed of all painful and mycotic nails today. Nails debrided in length and thickness using a nail nipper to level of comfort. Discussed treatment options including appropriate shoe gear. Follow up as needed for painful nails.   All symptomatic hyperkeratoses were safely debrided with a sterile #15 blade to patient's level of comfort without incident. We discussed preventative and palliative care of these lesions including supportive and accommodative shoegear, padding, prefabricated and custom molded accommodative orthoses, use of a pumice stone and lotions/creams daily.   Return in  about 3 months (around 05/31/2023) for at risk foot care.

## 2023-03-04 ENCOUNTER — Encounter: Payer: Self-pay | Admitting: Internal Medicine

## 2023-03-04 ENCOUNTER — Ambulatory Visit (INDEPENDENT_AMBULATORY_CARE_PROVIDER_SITE_OTHER): Payer: Medicare Other | Admitting: Internal Medicine

## 2023-03-04 VITALS — BP 122/68 | HR 79 | Temp 98.0°F | Ht 62.0 in | Wt 162.6 lb

## 2023-03-04 DIAGNOSIS — I2584 Coronary atherosclerosis due to calcified coronary lesion: Secondary | ICD-10-CM | POA: Diagnosis not present

## 2023-03-04 DIAGNOSIS — I2510 Atherosclerotic heart disease of native coronary artery without angina pectoris: Secondary | ICD-10-CM | POA: Diagnosis not present

## 2023-03-04 DIAGNOSIS — J0100 Acute maxillary sinusitis, unspecified: Secondary | ICD-10-CM | POA: Diagnosis not present

## 2023-03-04 MED ORDER — AMOXICILLIN-POT CLAVULANATE 875-125 MG PO TABS: 1.0000 | ORAL_TABLET | Freq: Two times a day (BID) | ORAL | 0 refills | Status: DC

## 2023-03-04 NOTE — Progress Notes (Unsigned)
Subjective:  Patient ID: [name] [name], female    DOB: 11-06-1936  Age: 87 y.o. MRN: [id number]  CC: There were no encounter diagnoses.   HPI [name] [name] presents for  Chief Complaint  Patient presents with   Cough    Cough and head congestion   Frontal, maxillary and ethmoid pressure/Sinus congestion thick  purulent drainage for the past  several weeks,  more recently has been seeing  some blood streaked drainage if she blows repeatedly . Denies rhinitis and sneezing   History:  given a muscle relaxer  (flexeril 10 mg ) by the dentist Dr Ceasar Mons on Wednesday for persistent tooth pain , took one dose caused her BP to drop to 90/50 hr in the 40's , acc'd by lethargy,  delirium,  husband gave her Gatorade ,  AMS lasted up to an hour      Outpatient Medications Prior to Visit  Medication Sig Dispense Refill   acetaminophen (TYLENOL) 500 MG tablet Take 1,000 mg by mouth every 6 (six) hours as needed (pain).      aluminum-magnesium hydroxide 200-200 MG/5ML suspension Take 20 mLs by mouth every 6 (six) hours as needed for indigestion.      apixaban (ELIQUIS) 5 MG TABS tablet Take 1 tablet (5 mg total) by mouth 2 (two) times daily. 60 tablet 6   Carboxymethylcellul-Glycerin (LUBRICATING EYE DROPS OP) Place 1 drop into both eyes daily as needed (dry eyes).     chlorhexidine (PERIDEX) 0.12 % solution Use as directed in the mouth or throat.     cholecalciferol (VITAMIN D) 1000 units tablet Take 1,000 Units by mouth daily.     Coenzyme Q10 (COQ10) 200 MG CAPS Take 200 mg by mouth daily.     diltiazem (CARDIZEM CD) 180 MG 24 hr capsule TAKE 1 CAPSULE (180 MG) BY MOUTH EVERY EVENING 90 capsule 1   metoprolol tartrate (LOPRESSOR) 50 MG tablet Take 1 tablet (50 mg total) by mouth 2 (two) times daily. 180 tablet 1   pantoprazole (PROTONIX) 40 MG tablet TAKE 1 TABLET BY MOUTH DAILY 90 tablet 1   pravastatin (PRAVACHOL) 20 MG tablet TAKE 1 TABLET BY MOUTH DAILY 90 tablet 1   Probiotic  Product (PROBIOTIC ADVANCED PO) Take 1 tablet by mouth daily.     sodium chloride (OCEAN) 0.65 % SOLN nasal spray Place 1 spray into both nostrils daily as needed for congestion.     No facility-administered medications prior to visit.    Review of Systems;  Patient denies headache, fevers, malaise, unintentional weight loss, skin rash, eye pain, sinus congestion and sinus pain, sore throat, dysphagia,  hemoptysis , cough, dyspnea, wheezing, chest pain, palpitations, orthopnea, edema, abdominal pain, nausea, melena, diarrhea, constipation, flank pain, dysuria, hematuria, urinary  Frequency, nocturia, numbness, tingling, seizures,  Focal weakness, Loss of consciousness,  Tremor, insomnia, depression, anxiety, and suicidal ideation.      Objective:  BP 122/68   Pulse 79   Temp 98 F (36.7 C) (Oral)   Ht 5\' 2"  (1.575 m)   Wt 162 lb 9.6 oz (73.8 kg)   SpO2 96%   BMI 29.74 kg/m   BP Readings from Last 3 Encounters:  03/04/23 122/68  02/28/23 (!) 142/80  01/27/23 121/68    Wt Readings from Last 3 Encounters:  03/04/23 162 lb 9.6 oz (73.8 kg)  12/29/22 161 lb 3.2 oz (73.1 kg)  11/04/22 158 lb 8 oz (71.9 kg)    Physical Exam  Lab Results  Component Value Date   HGBA1C 6.5 12/29/2022   HGBA1C 6.5 05/26/2022   HGBA1C 6.0 (A) 02/04/2021    Lab Results  Component Value Date   CREATININE 0.77 12/29/2022   CREATININE 0.71 05/26/2022   CREATININE 0.81 04/15/2022    Lab Results  Component Value Date   WBC 6.8 04/15/2022   HGB 13.4 04/15/2022   HCT 40.8 04/15/2022   PLT 274.0 04/15/2022   GLUCOSE 97 12/29/2022   CHOL 149 12/29/2022   TRIG 65.0 12/29/2022   HDL 66.10 12/29/2022   LDLDIRECT 72.0 12/29/2022   LDLCALC 70 12/29/2022   ALT 20 12/29/2022   AST 22 12/29/2022   NA 135 12/29/2022   K 4.4 12/29/2022   CL 99 12/29/2022   CREATININE 0.77 12/29/2022   BUN 11 12/29/2022   CO2 28 12/29/2022   TSH 0.60 12/29/2022   INR 1.3 02/12/2019   HGBA1C 6.5 12/29/2022    MICROALBUR 8.3 05/25/2017    NM Myocar Multi W/Spect W/Wall Motion / EF  Result Date: 09/09/2022 Pharmacological myocardial perfusion imaging study with no significant  ischemia Normal wall motion, EF estimated at 57% No EKG changes concerning for ischemia at peak stress or in recovery. CT attenuation correction images with moderate aortic atherosclerosis, mild coronary calcification Low risk scan Signed, Esmond Plants, MD, Ph.D [facility]    Assessment & Plan:  .There are no diagnoses linked to this encounter.   I provided 30 minutes of face-to-face time during this encounter reviewing patient's last visit with me, patient's  most recent visit with cardiology,  nephrology,  and neurology,  recent surgical and non surgical procedures, previous  labs and imaging studies, counseling on currently addressed issues,  and post visit ordering to diagnostics and therapeutics .   Follow-up: No follow-ups on file.   [name], MD

## 2023-03-04 NOTE — Patient Instructions (Addendum)
You can Use 1000 mg of tylenol every 6 hours for severe pain  ,  for up to one week   I am treating you  for sinusitis with augmentin .  Please Take it twice daily with food   Please eat a serving of yogurt or take a probiotic ( Align, Floraque or Culturelle) while you are on the antibiotic to prevent a serious antibiotic associated diarrhea  Called clostirudium dificile colitis

## 2023-03-04 NOTE — Assessment & Plan Note (Signed)
She has been congested for several weeks,  now having blood streaked sputum .  For dental procedure on Monday on same side,  will treate with augmentin x 7 days

## 2023-03-05 DIAGNOSIS — I2510 Atherosclerotic heart disease of native coronary artery without angina pectoris: Secondary | ICD-10-CM | POA: Insufficient documentation

## 2023-03-05 NOTE — Assessment & Plan Note (Signed)
She is currently asymptomatic. Continue current medications

## 2023-03-09 DIAGNOSIS — H43811 Vitreous degeneration, right eye: Secondary | ICD-10-CM | POA: Diagnosis not present

## 2023-03-09 DIAGNOSIS — H4322 Crystalline deposits in vitreous body, left eye: Secondary | ICD-10-CM | POA: Diagnosis not present

## 2023-03-09 DIAGNOSIS — H35371 Puckering of macula, right eye: Secondary | ICD-10-CM | POA: Diagnosis not present

## 2023-03-09 DIAGNOSIS — Z961 Presence of intraocular lens: Secondary | ICD-10-CM | POA: Diagnosis not present

## 2023-04-05 ENCOUNTER — Other Ambulatory Visit: Payer: Self-pay | Admitting: Physician Assistant

## 2023-04-05 NOTE — Telephone Encounter (Signed)
Refill Request.  

## 2023-04-05 NOTE — Telephone Encounter (Signed)
Pt last saw Dr Kirke Corin 11/04/22, last labs 12/29/22 Creat 0.77, age 87, weight 73.8kg, based on specified criteria pt is on appropriate dosage of Eliquis  BID for afib.  Will refill rx.

## 2023-04-07 ENCOUNTER — Inpatient Hospital Stay
Admission: EM | Admit: 2023-04-07 | Discharge: 2023-04-14 | DRG: 242 | Disposition: A | Discharge status: Home / Self Care | Payer: Medicare Other | Attending: Internal Medicine | Admitting: Internal Medicine

## 2023-04-07 DIAGNOSIS — Z8249 Family history of ischemic heart disease and other diseases of the circulatory system: Secondary | ICD-10-CM

## 2023-04-07 DIAGNOSIS — Z7901 Long term (current) use of anticoagulants: Secondary | ICD-10-CM

## 2023-04-07 DIAGNOSIS — I4819 Other persistent atrial fibrillation: Secondary | ICD-10-CM | POA: Diagnosis present

## 2023-04-07 DIAGNOSIS — I480 Paroxysmal atrial fibrillation: Secondary | ICD-10-CM

## 2023-04-07 DIAGNOSIS — Z96652 Presence of left artificial knee joint: Secondary | ICD-10-CM | POA: Diagnosis present

## 2023-04-07 DIAGNOSIS — N183 Chronic kidney disease, stage 3 unspecified: Secondary | ICD-10-CM | POA: Diagnosis present

## 2023-04-07 DIAGNOSIS — I455 Other specified heart block: Secondary | ICD-10-CM | POA: Diagnosis not present

## 2023-04-07 DIAGNOSIS — I129 Hypertensive chronic kidney disease with stage 1 through stage 4 chronic kidney disease, or unspecified chronic kidney disease: Secondary | ICD-10-CM | POA: Diagnosis present

## 2023-04-07 DIAGNOSIS — R0689 Other abnormalities of breathing: Secondary | ICD-10-CM | POA: Diagnosis not present

## 2023-04-07 DIAGNOSIS — Z833 Family history of diabetes mellitus: Secondary | ICD-10-CM

## 2023-04-07 DIAGNOSIS — R55 Syncope and collapse: Secondary | ICD-10-CM

## 2023-04-07 DIAGNOSIS — I10 Essential (primary) hypertension: Secondary | ICD-10-CM

## 2023-04-07 DIAGNOSIS — Z90710 Acquired absence of both cervix and uterus: Secondary | ICD-10-CM

## 2023-04-07 DIAGNOSIS — K219 Gastro-esophageal reflux disease without esophagitis: Secondary | ICD-10-CM | POA: Diagnosis present

## 2023-04-07 DIAGNOSIS — Z803 Family history of malignant neoplasm of breast: Secondary | ICD-10-CM

## 2023-04-07 DIAGNOSIS — I441 Atrioventricular block, second degree: Secondary | ICD-10-CM | POA: Diagnosis not present

## 2023-04-07 DIAGNOSIS — I495 Sick sinus syndrome: Principal | ICD-10-CM | POA: Diagnosis present

## 2023-04-07 DIAGNOSIS — I4892 Unspecified atrial flutter: Secondary | ICD-10-CM | POA: Diagnosis not present

## 2023-04-07 DIAGNOSIS — I2510 Atherosclerotic heart disease of native coronary artery without angina pectoris: Secondary | ICD-10-CM | POA: Diagnosis present

## 2023-04-07 DIAGNOSIS — E871 Hypo-osmolality and hyponatremia: Secondary | ICD-10-CM | POA: Diagnosis present

## 2023-04-07 DIAGNOSIS — K047 Periapical abscess without sinus: Secondary | ICD-10-CM | POA: Diagnosis present

## 2023-04-07 DIAGNOSIS — Z743 Need for continuous supervision: Secondary | ICD-10-CM | POA: Diagnosis not present

## 2023-04-07 DIAGNOSIS — Z9010 Acquired absence of unspecified breast and nipple: Secondary | ICD-10-CM | POA: Diagnosis not present

## 2023-04-07 DIAGNOSIS — Z91041 Radiographic dye allergy status: Secondary | ICD-10-CM

## 2023-04-07 DIAGNOSIS — I4510 Unspecified right bundle-branch block: Secondary | ICD-10-CM | POA: Diagnosis present

## 2023-04-07 DIAGNOSIS — Z885 Allergy status to narcotic agent status: Secondary | ICD-10-CM

## 2023-04-07 DIAGNOSIS — E876 Hypokalemia: Secondary | ICD-10-CM | POA: Diagnosis not present

## 2023-04-07 DIAGNOSIS — G9341 Metabolic encephalopathy: Secondary | ICD-10-CM | POA: Diagnosis not present

## 2023-04-07 DIAGNOSIS — I4821 Permanent atrial fibrillation: Secondary | ICD-10-CM

## 2023-04-07 DIAGNOSIS — E785 Hyperlipidemia, unspecified: Secondary | ICD-10-CM | POA: Diagnosis not present

## 2023-04-07 DIAGNOSIS — Z888 Allergy status to other drugs, medicaments and biological substances status: Secondary | ICD-10-CM

## 2023-04-07 DIAGNOSIS — Z9882 Breast implant status: Secondary | ICD-10-CM

## 2023-04-07 DIAGNOSIS — F0390 Unspecified dementia without behavioral disturbance: Secondary | ICD-10-CM | POA: Diagnosis present

## 2023-04-07 DIAGNOSIS — I499 Cardiac arrhythmia, unspecified: Secondary | ICD-10-CM | POA: Diagnosis not present

## 2023-04-07 DIAGNOSIS — R410 Disorientation, unspecified: Secondary | ICD-10-CM | POA: Diagnosis not present

## 2023-04-07 DIAGNOSIS — I4430 Unspecified atrioventricular block: Secondary | ICD-10-CM | POA: Diagnosis present

## 2023-04-07 DIAGNOSIS — Z841 Family history of disorders of kidney and ureter: Secondary | ICD-10-CM

## 2023-04-07 DIAGNOSIS — Z881 Allergy status to other antibiotic agents status: Secondary | ICD-10-CM

## 2023-04-07 DIAGNOSIS — Z9049 Acquired absence of other specified parts of digestive tract: Secondary | ICD-10-CM

## 2023-04-07 DIAGNOSIS — Z79899 Other long term (current) drug therapy: Secondary | ICD-10-CM | POA: Diagnosis not present

## 2023-04-07 DIAGNOSIS — Z85828 Personal history of other malignant neoplasm of skin: Secondary | ICD-10-CM

## 2023-04-07 DIAGNOSIS — R001 Bradycardia, unspecified: Secondary | ICD-10-CM | POA: Diagnosis not present

## 2023-04-07 DIAGNOSIS — S069X9A Unspecified intracranial injury with loss of consciousness of unspecified duration, initial encounter: Secondary | ICD-10-CM | POA: Diagnosis not present

## 2023-04-07 DIAGNOSIS — N1831 Chronic kidney disease, stage 3a: Secondary | ICD-10-CM | POA: Diagnosis not present

## 2023-04-07 DIAGNOSIS — Z800 Family history of malignant neoplasm of digestive organs: Secondary | ICD-10-CM

## 2023-04-07 DIAGNOSIS — Z87892 Personal history of anaphylaxis: Secondary | ICD-10-CM

## 2023-04-07 DIAGNOSIS — R404 Transient alteration of awareness: Secondary | ICD-10-CM | POA: Diagnosis not present

## 2023-04-07 DIAGNOSIS — W19XXXA Unspecified fall, initial encounter: Secondary | ICD-10-CM | POA: Diagnosis present

## 2023-04-07 LAB — CBC WITH DIFFERENTIAL/PLATELET
Abs Immature Granulocytes: 0.04 10*3/uL (ref 0.00–0.07)
Basophils Absolute: 0.1 10*3/uL (ref 0.0–0.1)
Basophils Relative: 1 %
Eosinophils Absolute: 0.1 10*3/uL (ref 0.0–0.5)
Eosinophils Relative: 1 %
HCT: 39 % (ref 36.0–46.0)
Hemoglobin: 12.7 g/dL (ref 12.0–15.0)
Immature Granulocytes: 0 %
Lymphocytes Relative: 7 %
Lymphs Abs: 0.6 10*3/uL — ABNORMAL LOW (ref 0.7–4.0)
MCH: 31.3 pg (ref 26.0–34.0)
MCHC: 32.6 g/dL (ref 30.0–36.0)
MCV: 96.1 fL (ref 80.0–100.0)
Monocytes Absolute: 0.4 10*3/uL (ref 0.1–1.0)
Monocytes Relative: 4 %
Neutro Abs: 8.6 10*3/uL — ABNORMAL HIGH (ref 1.7–7.7)
Neutrophils Relative %: 87 %
Platelets: 287 10*3/uL (ref 150–400)
RBC: 4.06 MIL/uL (ref 3.87–5.11)
RDW: 13.2 % (ref 11.5–15.5)
WBC: 9.8 10*3/uL (ref 4.0–10.5)
nRBC: 0 % (ref 0.0–0.2)

## 2023-04-07 LAB — BASIC METABOLIC PANEL
Anion gap: 7 (ref 5–15)
BUN: 20 mg/dL (ref 8–23)
CO2: 24 mmol/L (ref 22–32)
Calcium: 8.9 mg/dL (ref 8.9–10.3)
Chloride: 100 mmol/L (ref 98–111)
Creatinine, Ser: 0.89 mg/dL (ref 0.44–1.00)
GFR, Estimated: >60 mL/min (ref >60)
Glucose, Bld: 186 mg/dL — ABNORMAL HIGH (ref 70–99)
Potassium: 4.2 mmol/L (ref 3.5–5.1)
Sodium: 131 mmol/L — ABNORMAL LOW (ref 135–145)

## 2023-04-07 LAB — URINALYSIS, ROUTINE W REFLEX MICROSCOPIC
Bilirubin Urine: NEGATIVE
Glucose, UA: NEGATIVE mg/dL
Hgb urine dipstick: NEGATIVE
Ketones, ur: NEGATIVE mg/dL
Leukocytes,Ua: NEGATIVE
Nitrite: NEGATIVE
Protein, ur: NEGATIVE mg/dL
Specific Gravity, Urine: 1.005 (ref 1.005–1.030)
pH: 7 (ref 5.0–8.0)

## 2023-04-07 LAB — CBC
HCT: 37.7 % (ref 36.0–46.0)
Hemoglobin: 12.3 g/dL (ref 12.0–15.0)
MCH: 31.5 pg (ref 26.0–34.0)
MCHC: 32.6 g/dL (ref 30.0–36.0)
MCV: 96.7 fL (ref 80.0–100.0)
Platelets: 277 10*3/uL (ref 150–400)
RBC: 3.9 MIL/uL (ref 3.87–5.11)
RDW: 13.2 % (ref 11.5–15.5)
WBC: 8.7 10*3/uL (ref 4.0–10.5)
nRBC: 0 % (ref 0.0–0.2)

## 2023-04-07 LAB — CREATININE, SERUM
Creatinine, Ser: 0.78 mg/dL (ref 0.44–1.00)
GFR, Estimated: >60 mL/min (ref >60)

## 2023-04-07 LAB — TROPONIN I (HIGH SENSITIVITY)
Troponin I (High Sensitivity): 13 ng/L (ref <18)
Troponin I (High Sensitivity): 17 ng/L (ref <18)

## 2023-04-07 LAB — MAGNESIUM: Magnesium: 2.1 mg/dL (ref 1.7–2.4)

## 2023-04-07 MED ORDER — PRAVASTATIN SODIUM 20 MG PO TABS
20.0000 mg | ORAL_TABLET | Freq: Every day | ORAL | Status: DC
  Administered 2023-04-08 – 2023-04-14 (×6): 20 mg via ORAL
  Filled 2023-04-07 (×6): qty 1

## 2023-04-07 MED ORDER — ONDANSETRON HCL 4 MG/2ML IJ SOLN: 4.0000 mg | Freq: Four times a day (QID) | INTRAMUSCULAR | Status: DC | PRN

## 2023-04-07 MED ORDER — ACETAMINOPHEN 650 MG RE SUPP: 650.0000 mg | Freq: Four times a day (QID) | RECTAL | Status: DC | PRN

## 2023-04-07 MED ORDER — PANTOPRAZOLE SODIUM 40 MG PO TBEC
40.0000 mg | DELAYED_RELEASE_TABLET | Freq: Every day | ORAL | Status: DC
  Administered 2023-04-08 – 2023-04-14 (×6): 40 mg via ORAL
  Filled 2023-04-07 (×6): qty 1

## 2023-04-07 MED ORDER — APIXABAN 5 MG PO TABS
5.0000 mg | ORAL_TABLET | Freq: Two times a day (BID) | ORAL | Status: DC
  Administered 2023-04-07 – 2023-04-09 (×5): 5 mg via ORAL
  Filled 2023-04-07 (×5): qty 1

## 2023-04-07 MED ORDER — SODIUM CHLORIDE 0.9 % IV SOLN: INTRAVENOUS | Status: DC

## 2023-04-07 MED ORDER — ALUM & MAG HYDROXIDE-SIMETH 200-200-20 MG/5ML PO SUSP: 20.0000 mL | Freq: Four times a day (QID) | ORAL | Status: DC | PRN

## 2023-04-07 MED ORDER — POLYVINYL ALCOHOL 1.4 % OP SOLN: 1.0000 [drp] | Freq: Every day | OPHTHALMIC | Status: DC | PRN

## 2023-04-07 MED ORDER — ENOXAPARIN SODIUM 40 MG/0.4ML IJ SOSY: 40.0000 mg | PREFILLED_SYRINGE | INTRAMUSCULAR | Status: DC

## 2023-04-07 MED ORDER — ONDANSETRON HCL 4 MG PO TABS: 4.0000 mg | ORAL_TABLET | Freq: Four times a day (QID) | ORAL | Status: DC | PRN

## 2023-04-07 MED ORDER — ACETAMINOPHEN 325 MG PO TABS
650.0000 mg | ORAL_TABLET | Freq: Four times a day (QID) | ORAL | Status: DC | PRN
  Administered 2023-04-08 – 2023-04-11 (×5): 650 mg via ORAL
  Filled 2023-04-07 (×5): qty 2

## 2023-04-07 MED ORDER — MAGNESIUM HYDROXIDE 400 MG/5ML PO SUSP: 30.0000 mL | Freq: Every day | ORAL | Status: DC | PRN

## 2023-04-07 MED ORDER — TRAZODONE HCL 50 MG PO TABS
25.0000 mg | ORAL_TABLET | Freq: Every evening | ORAL | Status: DC | PRN
  Administered 2023-04-08 – 2023-04-13 (×6): 25 mg via ORAL
  Filled 2023-04-07 (×6): qty 1

## 2023-04-07 MED ORDER — COQ10 200 MG PO CAPS: 200.0000 mg | ORAL_CAPSULE | Freq: Every day | ORAL | Status: DC

## 2023-04-07 MED ORDER — VITAMIN D 25 MCG (1000 UNIT) PO TABS
1000.0000 [IU] | ORAL_TABLET | Freq: Every day | ORAL | Status: DC
  Administered 2023-04-08 – 2023-04-12 (×4): 1000 [IU] via ORAL
  Filled 2023-04-07 (×4): qty 1

## 2023-04-07 MED ORDER — SALINE SPRAY 0.65 % NA SOLN: 1.0000 | Freq: Every day | NASAL | Status: DC | PRN

## 2023-04-07 NOTE — ED Notes (Signed)
Cardiac pads placed on the patient. Code cart next to pt's bed.

## 2023-04-07 NOTE — ED Notes (Signed)
Pt had a 4.6 second cardiac pause. Report printed and given to provider. PT assessment: PT states that she felt a little dizzy. Able to follow commands.

## 2023-04-07 NOTE — ED Provider Notes (Signed)
[facility] Provider Note    Event Date/Time   First MD Initiated Contact with Patient 04/07/23 1739     (approximate)   History   Dizziness and Altered Mental Status   HPI  [name] [name] is a 87 y.o. female who presents to the emergency department today via EMS because of concerns for weakness and some altered mental status.  Symptoms apparently started this morning.  She does state that she thinks that he passed out.  She denies any pain.  EMS noted during transport that she had a couple episodes of sinus pauses where she seemed to pass out. EMS gave atropine.      Physical Exam   Triage Vital Signs: ED Triage Vitals  Enc Vitals Group     BP 04/07/23 1733 135/79     Pulse Rate 04/07/23 1733 (!) 102     Resp 04/07/23 1733 (!) 22     Temp 04/07/23 1733 98.2 F (36.8 C)     Temp Source 04/07/23 1733 Oral     SpO2 04/07/23 1733 99 %     Weight --      Height --      Head Circumference --      Peak Flow --      Pain Score 04/07/23 1734 0     Pain Loc --      Pain Edu? --      Excl. in GC? --     Most recent vital signs: Vitals:   04/07/23 1733  BP: 135/79  Pulse: (!) 102  Resp: (!) 22  Temp: 98.2 F (36.8 C)  SpO2: 99%   General: Awake, alert, not completely oriented. CV:  Good peripheral perfusion. Tachycardia Resp:  Normal effort. Lungs clear. Abd:  No distention.     ED Results / Procedures / Treatments   Labs (all labs ordered are listed, but only abnormal results are displayed) Labs Reviewed  CBC WITH DIFFERENTIAL/PLATELET - Abnormal; Notable for the following components:      Result Value   Neutro Abs 8.6 (*)    Lymphs Abs 0.6 (*)    All other components within normal limits  BASIC METABOLIC PANEL  URINALYSIS, ROUTINE W REFLEX MICROSCOPIC  MAGNESIUM  TROPONIN I (HIGH SENSITIVITY)     EKG  I, Phineas Semen, attending physician, personally viewed and interpreted this EKG  EKG Time: 1730 Rate: 104 Rhythm:  sinus rhythm vs junctional tachycardia vs a flutter Axis: normal Intervals: qtc 449 QRS: RBBB ST changes: no st elevation Impression: abnormal ekg   RADIOLOGY None   PROCEDURES:  Critical Care performed: No   MEDICATIONS ORDERED IN ED: Medications - No data to display   IMPRESSION / MDM / ASSESSMENT AND PLAN / ED COURSE  I reviewed the triage vital signs and the nursing notes.                              Differential diagnosis includes, but is not limited to, anemia, electrolyte abnormality, arrhythmia, MI  Patient's presentation is most consistent with acute presentation with potential threat to life or bodily function.   The patient is on the cardiac monitor to evaluate for evidence of arrhythmia and/or significant heart rate changes.  Patient presents to the emergency department today because of concerns for dizziness and syncope.  EMS reported multiple sinus pauses.  Here in the emergency department initial EKG showed potentially sinus tach versus a  flutter versus functional tachycardia however appropriate rate.  Blood work was initiated.  This did not show any anemia or concerning electrolyte abnormality.  However while on the cardiac monitor we were able to pick up a couple sinus pauses that lasted roughly 5 seconds.  This time do think this is likely cause of the patient's dizziness and syncope.  Discussed with Dr. Tharon Aquas with cardiology.  Did recommend holding beta and calcium channel blockers at this time.  Discussed with Dr. Arville Care with the hospitalist service who will plan on admission.      FINAL CLINICAL IMPRESSION(S) / ED DIAGNOSES   Final diagnoses:  Sinus pause  Syncope, unspecified syncope type     Note:  This document was prepared using Dragon voice recognition software and may include unintentional dictation errors.    Phineas Semen, MD 04/07/23 [phone]

## 2023-04-07 NOTE — ED Triage Notes (Signed)
Pt BIBA from home. Pt called for dizziness and AMS. EMS placed pt on the monitor and a 6 second pause. EMS never had to do CPR or pace the patient. Provider at bedside

## 2023-04-07 NOTE — H&P (Addendum)
[name] [name]   PATIENT NAME: [name] [name]    MR#:  [id number]  DATE OF BIRTH:  1936/03/02  DATE OF ADMISSION:  04/07/2023  PRIMARY CARE PHYSICIAN: Sherlene Shams, MD   Patient is coming from: Home  REQUESTING/REFERRING PHYSICIAN: Phineas Semen, MD  CHIEF COMPLAINT:   Chief Complaint  Patient presents with   Dizziness   Altered Mental Status    HISTORY OF PRESENT ILLNESS:  [name] [name] is a 87 y.o. 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  She has been having Alterman status and generalized weakness.  After syncope she had subsequent nausea and vomiting without bilious vomitus or hematemesis.  No paresthesias or focal muscle weakness.  She admitted to mild dyspnea.  No cough or wheezing.  She denies any chest pain or palpitations.  No fever or chills.  No dysuria, oliguria or hematuria or flank pain.  She stated that she has not been feeling good since yesterday.  The patient was noted to have sinus pauses by EMS as well as in the ER.  She was given atropine by EMS.  She denies any headache, dizziness or blurry vision or diplopia.  ED Course: Upon presentation to the emergency room, heart rate was 102 and respiratory rate 22 with otherwise normal vital signs.  Later on heart rate was 95.  Labs revealed hyponatremia 131 and blood glucose of 186 with otherwise normal BMP.  High sensitive troponin I was 13 and later 17 and CBC was within normal.  UA came back negative EKG as reviewed by me : EKG showed junctional tachycardia with rate 104 with right bundle branch block and T wave inversion laterally Imaging: None.  Contact made with Dr.  Lalla Brothers recommending holding of Cardizem and Lopressor for now.  She had pacer pads placed in the ER.  She will be admitted to cardiac telemetry bed for further evaluation and management. PAST MEDICAL HISTORY:   Past Medical History:  Diagnosis Date   Actinic keratosis    Arthritis    Atrial flutter (HCC) 02/2011   s/p cardioversion    Chest pain    a. H/o cardiac cath x 2-> 2012 -->nl cors;  b. 12/2016 MV: EF 61%, small region of mild perfusion defect in the apical anteroseptal region c/w breast attenuation, no ischemia-->Low risk; c. 06/2019 MV: Mod size, mild inflat ischemia, EF 59%. Cor and Ao Ca2+. Inflat defect more pronounced on this study compared to last; c. 07/2019 Cath: LM nl, LAD min irregs, LCX nl, OM1/2/3 nl, RCA min irregs.   CKD (chronic kidney disease), stage III (HCC)    Concussion with no loss of consciousness 09/01/2016   Cystocele    Degenerative disorder of bone    GERD (gastroesophageal reflux disease)    Headache(784.0)    chronic   Hiatal hernia    Hyperlipidemia    Hypertension    Influenza 01/10/2017   Knee fracture    Migraines    Mobitz type 2 second degree atrioventricular block    a. felt to be 2/2 amiodarone, resolved with decreased amiodarone dose.  Amio since d/c'd.   Permanent atrial fibrillation (HCC)    a. status post multiple DCCVs; b.  2018 - eval for PVI but opted for rate control.   PONV (postoperative nausea and vomiting)    oxycodone and codiene cause N/V    Pre-syncope    a. In setting of dehydration and AKI in the past.   Sleep apnea    Squamous cell carcinoma of skin 11/10/2020   left distal posterior deltoid (EDC 01/15/2021)   Tachycardia induced cardiomyopathy (HCC)    a. Resolved;  b. 08/2017 Echo: EF 50-55%, no rwma, mild MR, mildly to mod dil LA/RA; c. 02/2018 Echo: EF 55-60%, mild MR. Mildly dil LA. Nl RVSP. PASP .   Venous insufficiency    Vertigo     PAST SURGICAL HISTORY:   Past Surgical History:  Procedure Laterality  Date   ABDOMINAL HYSTERECTOMY  1990   APPENDECTOMY     AUGMENTATION MAMMAPLASTY Bilateral 1986   implants   AUGMENTATION MAMMAPLASTY  1990   AUGMENTATION MAMMAPLASTY  2011   CARDIAC CATHETERIZATION     CARDIOVERSION     x 3   CARDIOVERSION     CARDIOVERSION N/A 02/07/2017   Procedure: CARDIOVERSION;  Surgeon: Iran Ouch, MD;  Location: [facility] ORS;  Service: Cardiovascular;  Laterality: N/A;   CARDIOVERSION N/A 07/22/2017   Procedure: Cardioversion;  Surgeon: Antonieta Iba, MD;  Location: [facility] ORS;  Service: Cardiovascular;  Laterality: N/A;   CATARACT EXTRACTION W/PHACO Right 09/21/2016   Procedure: CATARACT EXTRACTION PHACO AND INTRAOCULAR LENS PLACEMENT (IOC);  Surgeon: Galen Manila, MD;  Location: [facility] ORS;  Service: Ophthalmology;  Laterality: Right;  [place] 44.1 AP% 16.5 CDE 7.30 Fluid Pack Lot #[id number]   CATARACT EXTRACTION W/PHACO Left 10/19/2016   Procedure: CATARACT EXTRACTION PHACO AND INTRAOCULAR LENS PLACEMENT (IOC);  Surgeon: Galen Manila, MD;  Location: [facility] ORS;  Service: Ophthalmology;  Laterality: Left;  [place] 53.7 AP% 19.5 CDE 10.45 Fluid pack lot # [id number]   CHOLECYSTECTOMY     COMBINED AUGMENTATION MAMMAPLASTY AND ABDOMINOPLASTY     JOINT REPLACEMENT Left 06/04/2013   left knee   KNEE ARTHROSCOPY Right 08/16/2016   Procedure: ARTHROSCOPY KNEE, tear posterior horn medial meniscus, tear anterior and posterior horns of lateral meniscus, chondromalacia of lateral compartment grade 3 patella and grade 4 medial;  Surgeon: Donato Heinz, MD;  Location: [facility] ORS;  Service: Orthopedics;  Laterality: Right;   LEFT HEART CATH AND CORONARY ANGIOGRAPHY Left 07/27/2019   Procedure: LEFT HEART CATH AND CORONARY ANGIOGRAPHY;  Surgeon: Iran Ouch, MD;  Location: [facility] INVASIVE CV LAB;  Service: Cardiovascular;  Laterality: Left;   MASTECTOMY  1986   nipple sparing mastectomy/Bilateral with silicone  breast implants, s/p saline replacements   Multiple orthopedic  procedures     NOSE SURGERY     TEE WITHOUT CARDIOVERSION N/A 09/26/2017   Procedure: TRANSESOPHAGEAL ECHOCARDIOGRAM (TEE);  Surgeon: Pricilla Riffle, MD;  Location: [facility] ENDOSCOPY;  Service: Cardiovascular;  Laterality: N/A;   TOTAL KNEE ARTHROPLASTY Left     SOCIAL HISTORY:   Social History   Tobacco Use   Smoking status: Never   Smokeless tobacco: Never  Substance Use Topics   Alcohol use: No    FAMILY HISTORY:   Family History  Problem Relation Age of Onset   Heart disease Mother    Stomach cancer Father    Breast cancer Sister 67   Breast cancer Sister 57   Breast cancer Sister 12   Breast cancer Sister    Breast cancer Maternal Grandmother    Diabetes Brother    Esophageal cancer Brother  Kidney failure Brother    Heart disease Son        found at autopsy   Ovarian cancer Neg Hx     DRUG ALLERGIES:   Allergies  Allergen Reactions   Cyclobenzaprine Other (See Comments)    Dropped blood pressure and heart rate, "talking out of my mind"   Iodine Anaphylaxis    NO PROBLEMS WITH BETADINE   Amiodarone     Cannot remember    Biaxin [Clarithromycin] Nausea And Vomiting   Codeine Nausea And Vomiting   Digoxin And Related Other (See Comments)    Fatigue, eye puffiness, hoarsness   Enalapril Other (See Comments)    unknown   Famotidine     Caused dizziness   Fluocinonide Other (See Comments)    Tingling sensation in head and redness to scalp.   Iodinated Contrast Media Other (See Comments)    Tachycardia   Iodine-131    Losartan Potassium Other (See Comments)    Dizzy and headache   Omnicef [Cefdinir]    Oxycodone Nausea And Vomiting   Promethazine Other (See Comments)    Unknown    Sertraline     Dizziness and weakness   Tizanidine Hcl Other (See Comments)    hypotension    Warfarin And Related     Bleeding    Xarelto [Rivaroxaban] Other (See Comments)    bleeding    REVIEW OF SYSTEMS:   ROS As per history of present illness. All pertinent  systems were reviewed above. Constitutional, HEENT, cardiovascular, respiratory, GI, GU, musculoskeletal, neuro, psychiatric, endocrine, integumentary and hematologic systems were reviewed and are otherwise negative/unremarkable except for positive findings mentioned above in the HPI.   MEDICATIONS AT HOME:   Prior to Admission medications   Medication Sig Start Date End Date Taking? Authorizing Provider  acetaminophen (TYLENOL) 500 MG tablet Take 1,000 mg by mouth every 6 (six) hours as needed (pain).    Yes [provider]  aluminum-magnesium hydroxide 200-200 MG/5ML suspension Take 20 mLs by mouth every 6 (six) hours as needed for indigestion.    Yes [provider]  amoxicillin (AMOXIL) 500 MG capsule Take 500 mg by mouth 3 (three) times daily. 04/01/23  Yes [provider]  apixaban (ELIQUIS) 5 MG TABS tablet TAKE ONE TABLET TWICE DAILY 04/05/23  Yes Iran Ouch, MD  Carboxymethylcellul-Glycerin (LUBRICATING EYE DROPS OP) Place 1 drop into both eyes daily as needed (dry eyes).   Yes [provider]  cholecalciferol (VITAMIN D) 1000 units tablet Take 1,000 Units by mouth daily.   Yes [provider]  Coenzyme Q10 (COQ10) 200 MG CAPS Take 200 mg by mouth daily.   Yes [provider]  diltiazem (CARDIZEM CD) 180 MG 24 hr capsule TAKE 1 CAPSULE (180 MG) BY MOUTH EVERY EVENING 11/16/22  Yes Iran Ouch, MD  metoprolol tartrate (LOPRESSOR) 50 MG tablet Take 1 tablet (50 mg total) by mouth 2 (two) times daily. 11/04/22  Yes Iran Ouch, MD  pantoprazole (PROTONIX) 40 MG tablet TAKE 1 TABLET BY MOUTH DAILY 10/20/22  Yes Sherlene Shams, MD  pravastatin (PRAVACHOL) 20 MG tablet TAKE 1 TABLET BY MOUTH DAILY 11/08/22  Yes Iran Ouch, MD  Probiotic Product (PROBIOTIC ADVANCED PO) Take 1 tablet by mouth daily.   Yes [provider]  sodium chloride (OCEAN) 0.65 % SOLN nasal spray Place 1 spray into both nostrils daily as  needed for congestion.   Yes [provider]  amoxicillin-clavulanate (AUGMENTIN) [phone]  MG tablet Take 1 tablet by mouth 2 (two) times daily. Patient not taking: Reported on 04/07/2023 03/04/23   Sherlene Shams, MD  chlorhexidine (PERIDEX) 0.12 % solution Use as directed in the mouth or throat. Patient not taking: Reported on 04/07/2023 09/24/20   [provider]      VITAL SIGNS:  Blood pressure 134/85, pulse 96, temperature 98.2 F (36.8 C), temperature source Oral, resp. rate 16, SpO2 95 %.  PHYSICAL EXAMINATION:  Physical Exam  GENERAL:  87 y.o.-year-old Caucasian female patient lying in the bed with no acute distress.  EYES: Pupils equal, round, reactive to light and accommodation. No scleral icterus. Extraocular muscles intact.  HEENT: Head atraumatic, normocephalic. Oropharynx and nasopharynx clear.  NECK:  Supple, no jugular venous distention. No thyroid enlargement, no tenderness.  LUNGS: Normal breath sounds bilaterally, no wheezing, rales,rhonchi or crepitation. No use of accessory muscles of respiration.  CARDIOVASCULAR: Regular rate and rhythm, S1, S2 normal. No murmurs, rubs, or gallops.  ABDOMEN: Soft, nondistended, nontender. Bowel sounds present. No organomegaly or mass.  EXTREMITIES: No pedal edema, cyanosis, or clubbing.  NEUROLOGIC: Cranial nerves II through XII are intact. Muscle strength 5/5 in all extremities. Sensation intact. Gait not checked.  PSYCHIATRIC: The patient is alert and oriented x 3.  Normal affect and good eye contact. SKIN: No obvious rash, lesion, or ulcer.   LABORATORY PANEL:   CBC Recent Labs  Lab 04/07/23 2014  WBC 8.7  HGB 12.3  HCT 37.7  PLT 277   ------------------------------------------------------------------------------------------------------------------  Chemistries  Recent Labs  Lab 04/07/23 1733 04/07/23 2014  NA 131*  --   K 4.2  --   CL 100  --   CO2 24  --   GLUCOSE 186*  --   BUN 20  --    CREATININE 0.89 0.78  CALCIUM 8.9  --   MG 2.1  --    ------------------------------------------------------------------------------------------------------------------  Cardiac Enzymes No results for input(s): "TROPONINI" in the last 168 hours. ------------------------------------------------------------------------------------------------------------------  RADIOLOGY:  No results found.    IMPRESSION AND PLAN:  Assessment and Plan: * Syncope - The patient will be admitted to an observation cardiac telemetry bed. -This is likely secondary to her sinus pauses. - Will check orthostatics q 12 hours. - Will obtain a bilateral carotid Doppler and 2D echo. - The patient will be gently hydrated with IV normal saline and monitored for arrhythmias. -Differential diagnoses would include neurally mediated syncope,  arrhythmias related,  orthostatic hypotension and less likely hypoglycemia.     Paroxysmal atrial fibrillation - We will continue Eliquis. - We will hold off Lopressor and Cardizem CD for now. - The patient has a history of Mobitz type II second-degree heart block from amiodarone.  Essential hypertension - We will place her on as needed IV hydralazine.  GERD without esophagitis - We will continue PPI therapy.    DVT prophylaxis: Eliquis Advanced Care Planning:  Code Status: full code. Family Communication:  The plan of care was discussed in details with the patient (and family). I answered all questions. The patient agreed to proceed with the above mentioned plan. Further management will depend upon hospital course. Disposition Plan: Back to previous home environment Consults called: Cardiology All the records are reviewed and case discussed with ED provider.  Status is: Observation   I certify that at the time of admission, it is my clinical judgment that the patient will require  hospital care extending less than 2 midnights.  Dispo: The patient is from: Home              Anticipated d/c is to: Home              Patient currently is not medically stable to d/c.              Difficult to place patient: No  Hannah Beat M.D on 04/08/2023 at 1:35 AM  [facility]   From 7 PM-7 AM, contact night-coverage [email or web address]  CC: Primary care physician; Sherlene Shams, MD

## 2023-04-08 ENCOUNTER — Other Ambulatory Visit: Payer: Self-pay

## 2023-04-08 ENCOUNTER — Encounter: Payer: Self-pay | Admitting: Family Medicine

## 2023-04-08 ENCOUNTER — Observation Stay: Payer: Medicare Other

## 2023-04-08 ENCOUNTER — Observation Stay (HOSPITAL_COMMUNITY)
Admit: 2023-04-08 | Discharge: 2023-04-08 | Disposition: A | Discharge status: Home / Self Care | Payer: Medicare Other | Attending: Family Medicine | Admitting: Family Medicine

## 2023-04-08 DIAGNOSIS — I480 Paroxysmal atrial fibrillation: Secondary | ICD-10-CM | POA: Insufficient documentation

## 2023-04-08 DIAGNOSIS — Z90710 Acquired absence of both cervix and uterus: Secondary | ICD-10-CM | POA: Diagnosis not present

## 2023-04-08 DIAGNOSIS — I4821 Permanent atrial fibrillation: Secondary | ICD-10-CM

## 2023-04-08 DIAGNOSIS — I4430 Unspecified atrioventricular block: Secondary | ICD-10-CM | POA: Diagnosis present

## 2023-04-08 DIAGNOSIS — N1831 Chronic kidney disease, stage 3a: Secondary | ICD-10-CM | POA: Diagnosis present

## 2023-04-08 DIAGNOSIS — G9341 Metabolic encephalopathy: Secondary | ICD-10-CM | POA: Diagnosis present

## 2023-04-08 DIAGNOSIS — Z7901 Long term (current) use of anticoagulants: Secondary | ICD-10-CM | POA: Diagnosis not present

## 2023-04-08 DIAGNOSIS — I129 Hypertensive chronic kidney disease with stage 1 through stage 4 chronic kidney disease, or unspecified chronic kidney disease: Secondary | ICD-10-CM | POA: Diagnosis present

## 2023-04-08 DIAGNOSIS — Z79899 Other long term (current) drug therapy: Secondary | ICD-10-CM | POA: Diagnosis not present

## 2023-04-08 DIAGNOSIS — R001 Bradycardia, unspecified: Secondary | ICD-10-CM | POA: Diagnosis not present

## 2023-04-08 DIAGNOSIS — I6523 Occlusion and stenosis of bilateral carotid arteries: Secondary | ICD-10-CM | POA: Diagnosis not present

## 2023-04-08 DIAGNOSIS — I2510 Atherosclerotic heart disease of native coronary artery without angina pectoris: Secondary | ICD-10-CM | POA: Diagnosis present

## 2023-04-08 DIAGNOSIS — K047 Periapical abscess without sinus: Secondary | ICD-10-CM | POA: Diagnosis present

## 2023-04-08 DIAGNOSIS — R410 Disorientation, unspecified: Secondary | ICD-10-CM

## 2023-04-08 DIAGNOSIS — E871 Hypo-osmolality and hyponatremia: Secondary | ICD-10-CM | POA: Diagnosis present

## 2023-04-08 DIAGNOSIS — Z96652 Presence of left artificial knee joint: Secondary | ICD-10-CM | POA: Diagnosis present

## 2023-04-08 DIAGNOSIS — K219 Gastro-esophageal reflux disease without esophagitis: Secondary | ICD-10-CM | POA: Diagnosis present

## 2023-04-08 DIAGNOSIS — E785 Hyperlipidemia, unspecified: Secondary | ICD-10-CM | POA: Diagnosis present

## 2023-04-08 DIAGNOSIS — I10 Essential (primary) hypertension: Secondary | ICD-10-CM | POA: Diagnosis not present

## 2023-04-08 DIAGNOSIS — R42 Dizziness and giddiness: Secondary | ICD-10-CM | POA: Diagnosis not present

## 2023-04-08 DIAGNOSIS — E876 Hypokalemia: Secondary | ICD-10-CM | POA: Diagnosis not present

## 2023-04-08 DIAGNOSIS — R55 Syncope and collapse: Secondary | ICD-10-CM | POA: Diagnosis not present

## 2023-04-08 DIAGNOSIS — N183 Chronic kidney disease, stage 3 unspecified: Secondary | ICD-10-CM | POA: Diagnosis present

## 2023-04-08 DIAGNOSIS — F0390 Unspecified dementia without behavioral disturbance: Secondary | ICD-10-CM | POA: Diagnosis present

## 2023-04-08 DIAGNOSIS — W19XXXA Unspecified fall, initial encounter: Secondary | ICD-10-CM | POA: Diagnosis present

## 2023-04-08 DIAGNOSIS — I4892 Unspecified atrial flutter: Secondary | ICD-10-CM | POA: Diagnosis present

## 2023-04-08 DIAGNOSIS — I455 Other specified heart block: Secondary | ICD-10-CM | POA: Diagnosis not present

## 2023-04-08 DIAGNOSIS — Z85828 Personal history of other malignant neoplasm of skin: Secondary | ICD-10-CM | POA: Diagnosis not present

## 2023-04-08 DIAGNOSIS — Z950 Presence of cardiac pacemaker: Secondary | ICD-10-CM | POA: Diagnosis not present

## 2023-04-08 DIAGNOSIS — I441 Atrioventricular block, second degree: Secondary | ICD-10-CM | POA: Diagnosis present

## 2023-04-08 DIAGNOSIS — I495 Sick sinus syndrome: Secondary | ICD-10-CM | POA: Diagnosis not present

## 2023-04-08 DIAGNOSIS — I4510 Unspecified right bundle-branch block: Secondary | ICD-10-CM | POA: Diagnosis present

## 2023-04-08 DIAGNOSIS — S069X9A Unspecified intracranial injury with loss of consciousness of unspecified duration, initial encounter: Secondary | ICD-10-CM | POA: Diagnosis present

## 2023-04-08 DIAGNOSIS — Z9010 Acquired absence of unspecified breast and nipple: Secondary | ICD-10-CM | POA: Diagnosis not present

## 2023-04-08 LAB — CBC
HCT: 38 % (ref 36.0–46.0)
Hemoglobin: 12.2 g/dL (ref 12.0–15.0)
MCH: 31.2 pg (ref 26.0–34.0)
MCHC: 32.1 g/dL (ref 30.0–36.0)
MCV: 97.2 fL (ref 80.0–100.0)
Platelets: 282 10*3/uL (ref 150–400)
RBC: 3.91 MIL/uL (ref 3.87–5.11)
RDW: 13.2 % (ref 11.5–15.5)
WBC: 10.2 10*3/uL (ref 4.0–10.5)
nRBC: 0 % (ref 0.0–0.2)

## 2023-04-08 LAB — BASIC METABOLIC PANEL
Anion gap: 6 (ref 5–15)
BUN: 16 mg/dL (ref 8–23)
CO2: 23 mmol/L (ref 22–32)
Calcium: 9 mg/dL (ref 8.9–10.3)
Chloride: 109 mmol/L (ref 98–111)
Creatinine, Ser: 0.77 mg/dL (ref 0.44–1.00)
GFR, Estimated: >60 mL/min (ref >60)
Glucose, Bld: 132 mg/dL — ABNORMAL HIGH (ref 70–99)
Potassium: 3.9 mmol/L (ref 3.5–5.1)
Sodium: 136 mmol/L (ref 135–145)

## 2023-04-08 LAB — ECHOCARDIOGRAM COMPLETE
AR max vel: 2.24 cm2
AV Area VTI: 2.56 cm2
AV Area mean vel: 2.24 cm2
AV Mean grad: 3 mmHg
AV Peak grad: 4.8 mmHg
Ao pk vel: 1.09 m/s
Area-P 1/2: 5.79 cm2
S' Lateral: 2 cm

## 2023-04-08 LAB — TSH: TSH: 0.971 u[IU]/mL (ref 0.350–4.500)

## 2023-04-08 MED ORDER — HYDRALAZINE HCL 20 MG/ML IJ SOLN: 10.0000 mg | Freq: Four times a day (QID) | INTRAMUSCULAR | Status: DC | PRN

## 2023-04-08 MED ORDER — ALPRAZOLAM 0.5 MG PO TABS
0.2500 mg | ORAL_TABLET | Freq: Once | ORAL | Status: AC
  Administered 2023-04-08: 0.25 mg via ORAL
  Filled 2023-04-08: qty 1

## 2023-04-08 MED ORDER — HALOPERIDOL LACTATE 5 MG/ML IJ SOLN
1.0000 mg | Freq: Four times a day (QID) | INTRAMUSCULAR | Status: DC | PRN
  Administered 2023-04-08: 1 mg via INTRAVENOUS
  Filled 2023-04-08: qty 1

## 2023-04-08 NOTE — ED Notes (Signed)
Pt given lunch tray.

## 2023-04-08 NOTE — ED Notes (Signed)
Orthostatic vital signs not done d/t pt AMS and cardiac rhythm in ER over night

## 2023-04-08 NOTE — ED Notes (Addendum)
Was called into the room by family. Pt standing on side of bed with her gown in hand, urine on the floor and pulling at all wires. Pt assisted to the bedside commode. New gown placed monitor reattached, Pt back to bed with bed alarm on. Pt confused. Pt alert to herself. Pt talking about going into the other room to do some sewing. Family at bedside. Primary RN notified.

## 2023-04-08 NOTE — Progress Notes (Signed)
Progress Note   [name]:[id number] DOB: 05-07-1936 DOA: 04/07/2023     0 DOS: the patient was seen and examined on 04/08/2023   Brief hospital course: HPI on admission 04/07/23 PM by Dr. Arville Care: "[name] [name] is a 87 y.o. 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  She has been having Alterman status and generalized weakness.  After syncope she had subsequent nausea and vomiting without bilious vomitus or hematemesis.  No paresthesias or focal muscle weakness.  She admitted to mild dyspnea.  No cough or wheezing.  She denies any chest pain or palpitations.  No fever or chills.  No dysuria, oliguria or hematuria or flank pain.  She stated that she has not been feeling good since yesterday.  The patient was noted to have sinus pauses by EMS as well as in the ER.  She was given atropine by EMS.  She denies any headache, dizziness or blurry vision or diplopia.   ED Course: Upon presentation to the emergency room, heart rate was 102 and respiratory rate 22 with otherwise normal vital signs.  Later on heart rate was 95.  Labs revealed hyponatremia 131 and blood glucose of 186 with otherwise normal BMP.  High sensitive troponin I was 13 and later 17 and CBC was within normal.  UA came back negative EKG as reviewed by me : EKG "  Patient was admitted to cardiac telemetry with Cardiology consulted.  Cardizem and Lopressor were held.  Pacer pads were placed in case of need for transcutaneous pacing.    Assessment and Plan: * Syncope and collapse Due to sinus pauses.   --Mgmt of pauses as outlined --Fall precautions --Orthostatic vitals  Sinus  pause Sinus pauses up to 6 sec noted by EMS.  In setting of rate control agents for Permanent A-fib --Hold Cardizem and Lopressor --Monitor on telemetry --Atropine PRN for symptomatic pauses/bradycardia --Pacer pads in place --Allow time for rate control meds to wash out --May require pacemaker  --Cardiology following, see their recommendations  Paroxysmal atrial fibrillation-resolved as of 04/08/2023 - We will continue Eliquis. - We will hold off Lopressor and Cardizem CD for now. - The patient has a history of Mobitz type II second-degree heart block from amiodarone.  Essential hypertension IV hydralazine PRN for now. Holding Cardizem and Lopressor  Confusion Baseline mental status is unclear, suspect at least some mild cognitive impairment.  Might be related to sinus pauses and her syncopal episode.  Head CT was non-acute. --Delirium precautions --Avoid sedating meds as possible --Given low dose Xanax x 1 today for persistent agitation, restlessness  --Mgmt of underlying issues as outlined --Inquire baseline mentation from family as able  GERD without esophagitis Continue PPI   Persistent atrial fibrillation Continue Eliquis for now, hold on Saturday 4/20 if persistent sinus pauses after wash out of rate control agents. Hold Cardizem, Lopressor Telemetry monitoring Cardiology following        Subjective: Pt seen in ED holding for a bed, husband at bedside.  Pt is pleasantly confused. When I ask about her passing out and her recent symptoms, she talks about her husband's recent fall/s.  She denies acute complaints including any head pain, chest pain or  palpitations, feeling dizzy or lightheaded.   Physical Exam: Vitals:   04/08/23 1130 04/08/23 1157 04/08/23 1430 04/08/23 1530  BP:   (!) 149/77 (!) 145/110  Pulse: 95  (!) 102 96  Resp: (!) 23  15 (!) 24  Temp:  98.4 F (36.9 C)    TempSrc:      SpO2: 96%  95% 95%   General exam: awake, alert, no acute distress,  confused, pleasant HEENT: atraumatic, clear conjunctiva, anicteric sclera, moist mucus membranes, hearing grossly normal  Respiratory system: CTAB, no wheezes, rales or rhonchi, normal respiratory effort. Cardiovascular system: normal S1/S2, irregular rhythm, regular rate, no JVD, murmurs, rubs, gallops, no pedal edema.   Gastrointestinal system: soft, NT, ND, no HSM felt, +bowel sounds. Central nervous system: A&O x self. no gross focal neurologic deficits, normal speech Extremities: moves all , no edema, normal tone Skin: dry, intact, normal temperature Psychiatry: normal mood, congruent affect, abnormal judgement and insight    Data Reviewed:  Notable labs --- glucose 132, otherwise normal BMP and CBC    Echocardiogram -- EF 55-60%, indeterminate diastolic parameters, moderately elevated PASP, severely dilated right and left atria, mild-moderate MR, mild-moderate TR  Family Communication: husband at bedside  Disposition: Status is: Inpatient Remains inpatient appropriate because: Ongoing evaluation of sinus pauses   Planned Discharge Destination: Home    Time spent: 42 minutes  Author: Pennie Banter, DO 04/08/2023 4:37 PM  For on call review [email or web address].

## 2023-04-08 NOTE — Assessment & Plan Note (Addendum)
IV hydralazine PRN for now. Holding Cardizem and Lopressor

## 2023-04-08 NOTE — Assessment & Plan Note (Signed)
Hold Eliquis for possible PM placement. Hold Cardizem, Lopressor Telemetry monitoring Cardiology following

## 2023-04-08 NOTE — ED Notes (Signed)
Pt confused, trying to get out bed. Redirected and given breakfast tray.

## 2023-04-08 NOTE — Consult Note (Signed)
ELECTROPHYSIOLOGY CONSULT NOTE    Patient ID: [name] [name] MRN: [id number], DOB/AGE: 1936-03-25 87 y.o.  Admit date: 04/07/2023 Date of Consult: 04/08/2023  Primary Physician: Sherlene Shams, MD Primary Cardiologist: Lorine Bears, MD  Electrophysiologist: none, previously Dr. Johney Frame  Referring Provider: Dr. Denton Lank  Patient Profile: [name] [name] is a 87 y.o. 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 Denton Lank.  HPI:  [name] [name] is a 87 y.o. female with above PMH who syncopized with brief LOC yesterday, with head injury. Had altered mental status and weakness afterwards. Nausea and vomiting.   EMS found her to have sinus pauses on EKG was given 1mg  atropine    In [facility] ER, HR 102. BB and dilt has been held.   Today, she is unable to recall the events that led to her presentation to ER. Step-son is sat bedside who was not present, husband has gone home to rest for a bit.  Step-son tells me that patient's memory has been bad lately.   She denies chest pain, palpitations, dyspnea, PND, orthopnea, nausea, vomiting, dizziness, syncope, edema, weight gain, or early satiety.   Labs Potassium3.9 (04/19 2130) Magnesium  2.1 (04/18 1733) Creatinine, ser  0.77 (04/19 0516) PLT  282 (04/19 0516) HGB  12.2 (04/19 0516) WBC 10.2 (04/19 0516) Troponin I (High Sensitivity)17 (04/18 2014).    Past Medical History:  Diagnosis Date   Actinic keratosis    Arthritis    Atrial flutter (HCC) 02/2011   s/p cardioversion    Chest pain    a. H/o cardiac cath x 2-> 2012 -->nl cors;  b. 12/2016 MV: EF 61%, small region of mild perfusion defect in the apical anteroseptal region c/w breast attenuation, no ischemia-->Low risk; c. 06/2019 MV: Mod size, mild inflat ischemia, EF 59%. Cor and Ao Ca2+. Inflat defect more pronounced on this study compared to  last; c. 07/2019 Cath: LM nl, LAD min irregs, LCX nl, OM1/2/3 nl, RCA min irregs.   CKD (chronic kidney disease), stage III (HCC)    Concussion with no loss of consciousness 09/01/2016   Cystocele    Degenerative disorder of bone    GERD (gastroesophageal reflux disease)    Headache(784.0)    chronic   Hiatal hernia    Hyperlipidemia    Hypertension    Influenza 01/10/2017   Knee fracture    Migraines    Mobitz type 2 second degree atrioventricular block    a. felt to be 2/2 amiodarone, resolved with decreased amiodarone dose.  Amio since d/c'd.   Permanent atrial fibrillation (HCC)    a. status post multiple DCCVs; b. 2018 - eval for PVI but opted for rate control.   PONV (postoperative nausea and vomiting)    oxycodone and codiene cause N/V    Pre-syncope    a. In setting of dehydration and AKI in the past.   Sleep apnea    Squamous cell carcinoma of skin 11/10/2020   left distal posterior deltoid (EDC 01/15/2021)   Tachycardia induced cardiomyopathy (HCC)    a. Resolved;  b. 08/2017 Echo: EF 50-55%, no rwma, mild MR, mildly to mod dil LA/RA; c. 02/2018 Echo: EF 55-60%, mild MR. Mildly dil LA. Nl RVSP. PASP .   Venous insufficiency    Vertigo      Surgical History:  Past Surgical History:  Procedure Laterality Date   ABDOMINAL HYSTERECTOMY  1990   APPENDECTOMY     AUGMENTATION MAMMAPLASTY Bilateral 1986   implants   AUGMENTATION MAMMAPLASTY  1990   AUGMENTATION MAMMAPLASTY  2011   CARDIAC CATHETERIZATION     CARDIOVERSION     x 3   CARDIOVERSION     CARDIOVERSION N/A 02/07/2017   Procedure: CARDIOVERSION;  Surgeon: Iran Ouch, MD;  Location: [facility] ORS;  Service: Cardiovascular;  Laterality: N/A;   CARDIOVERSION N/A 07/22/2017   Procedure: Cardioversion;  Surgeon: Antonieta Iba, MD;  Location: [facility] ORS;  Service: Cardiovascular;  Laterality: N/A;   CATARACT EXTRACTION W/PHACO Right 09/21/2016   Procedure: CATARACT EXTRACTION PHACO AND INTRAOCULAR LENS  PLACEMENT (IOC);  Surgeon: Galen Manila, MD;  Location: [facility] ORS;  Service: Ophthalmology;  Laterality: Right;  [place] 44.1 AP% 16.5 CDE 7.30 Fluid Pack Lot #[id number]   CATARACT EXTRACTION W/PHACO Left 10/19/2016   Procedure: CATARACT EXTRACTION PHACO AND INTRAOCULAR LENS PLACEMENT (IOC);  Surgeon: Galen Manila, MD;  Location: [facility] ORS;  Service: Ophthalmology;  Laterality: Left;  [place] 53.7 AP% 19.5 CDE 10.45 Fluid pack lot # [id number]   CHOLECYSTECTOMY     COMBINED AUGMENTATION MAMMAPLASTY AND ABDOMINOPLASTY     JOINT REPLACEMENT Left 06/04/2013   left knee   KNEE ARTHROSCOPY Right 08/16/2016   Procedure: ARTHROSCOPY KNEE, tear posterior horn medial meniscus, tear anterior and posterior horns of lateral meniscus, chondromalacia of lateral compartment grade 3 patella and grade 4 medial;  Surgeon: Donato Heinz, MD;  Location: [facility] ORS;  Service: Orthopedics;  Laterality: Right;   LEFT HEART CATH AND CORONARY ANGIOGRAPHY Left 07/27/2019   Procedure: LEFT HEART CATH AND CORONARY ANGIOGRAPHY;  Surgeon: Iran Ouch, MD;  Location: [facility] INVASIVE CV LAB;  Service: Cardiovascular;  Laterality: Left;   MASTECTOMY  1986   nipple sparing mastectomy/Bilateral with silicone  breast implants, s/p saline replacements   Multiple orthopedic procedures     NOSE SURGERY     TEE WITHOUT CARDIOVERSION N/A 09/26/2017   Procedure: TRANSESOPHAGEAL ECHOCARDIOGRAM (TEE);  Surgeon: Pricilla Riffle, MD;  Location: [facility] ENDOSCOPY;  Service: Cardiovascular;  Laterality: N/A;   TOTAL KNEE ARTHROPLASTY Left      (Not in a hospital admission)   Inpatient Medications:   apixaban  5 mg Oral BID   cholecalciferol  1,000 Units Oral Daily   pantoprazole  40 mg Oral Daily   pravastatin  20 mg Oral Daily    Allergies:  Allergies  Allergen Reactions   Cyclobenzaprine Other (See Comments)    Dropped blood pressure and heart rate, "talking out of my mind"   Iodine Anaphylaxis    NO PROBLEMS WITH BETADINE    Amiodarone     Cannot remember    Biaxin [Clarithromycin] Nausea And Vomiting   Codeine Nausea And Vomiting   Digoxin And Related Other (See Comments)    Fatigue, eye puffiness, hoarsness   Enalapril Other (See Comments)    unknown   Famotidine     Caused dizziness   Fluocinonide Other (See Comments)    Tingling sensation in head and redness to scalp.   Iodinated Contrast Media Other (See Comments)    Tachycardia   Iodine-131    Losartan Potassium Other (See Comments)    Dizzy and headache   Omnicef [Cefdinir]    Oxycodone Nausea And Vomiting   Promethazine Other (See Comments)    Unknown    Sertraline     Dizziness and weakness   Tizanidine Hcl Other (  See Comments)    hypotension    Warfarin And Related     Bleeding    Xarelto [Rivaroxaban] Other (See Comments)    bleeding    Family History  Problem Relation Age of Onset   Heart disease Mother    Stomach cancer Father    Breast cancer Sister 20   Breast cancer Sister 72   Breast cancer Sister 58   Breast cancer Sister    Breast cancer Maternal Grandmother    Diabetes Brother    Esophageal cancer Brother    Kidney failure Brother    Heart disease Son        found at autopsy   Ovarian cancer Neg Hx      Physical Exam: Vitals:   04/08/23 0400 04/08/23 0500 04/08/23 0530 04/08/23 0600  BP: (!) 134/98 136/79 (!) 141/72 (!) 146/76  Pulse: (!) 105 97 90 100  Resp: (!) Temp:      TempSrc:      SpO2: 95% 96% 97% 96%    GEN- NAD, A&O x 1, pleasant affect HEENT: Normocephalic, atraumatic Lungs- CTAB, Normal effort.  Heart- Regular rate and rhythm, No M/G/R.  GI- Soft, NT, ND.  Extremities- No clubbing, cyanosis, or edema   Radiology/Studies: US Carotid Bilateral  Result Date: 04/08/2023 CLINICAL DATA:  106001.  Syncopal episode. EXAM: BILATERAL CAROTID DUPLEX ULTRASOUND TECHNIQUE: Wallace Cullens scale imaging, color Doppler and duplex ultrasound were performed of bilateral carotid and vertebral  arteries in the neck. COMPARISON:  No prior physiologic study. Limited comparison is available with noncontrast CT scan cervical spine dated 03/24/2020, showing mild calcific plaque at the left carotid bifurcation but no other visible carotid disease without contrast. FINDINGS: Criteria: Quantification of carotid stenosis is based on velocity parameters that correlate the residual internal carotid diameter with NASCET-based stenosis levels, using the diameter of the distal internal carotid lumen as the denominator for stenosis measurement. The following velocity measurements were obtained: RIGHT Subclavian PSV: 154.3 cm/sec ICA: 80.5 cm/sec CCA: 77.9 cm/sec SYSTOLIC ICA/CCA RATIO:  1.2 ECA: 106.7 cm/sec LEFT Subclavian PSV: 148.6 cm/sec ICA: 190.5 cm/sec CCA: 84.0 cm/sec SYSTOLIC ICA/CCA RATIO:  2.6 ECA: 101.8 cm/sec RIGHT CAROTID ARTERY: No critical stenosis is seen. The ICA EDV is 22.0 cm/sec. There is mild intimal thickening in the common carotid artery. There is a small amount of hypoechoic nonstenosing soft plaque at the bifurcation. This does extend into the cervical ICA origin but it causes less than 50% stenosis visually. RIGHT VERTEBRAL ARTERY: Patent with antegrade flow, PSV 79.7 cm/sec. LEFT CAROTID ARTERY: The common carotid artery demonstrates mild intimal thickening without stenosis. There are heterogeneous mixed plaques at the bulb extending into the cervical ICA origin with estimated approximately 60-70% stenosis in the proximal cervical ICA. Remainder of the vessel is tortuous with some degree of spectral broadening when compared to the right side. Left ICA EDV is 54.8 cm/sec. LEFT VERTEBRAL ARTERY: Patent with antegrade flow, PSV 49.1 centimeter/second. Upper extremity blood pressures: Not measured. IMPRESSION: 1. Heterogeneous mixed plaques at the left carotid bulb extending into the cervical ICA with estimated 60-70% proximal left ICA stenosis by Doppler criteria. 2. Mild hypoechoic nonstenosing  soft plaque at the right carotid bifurcation, extending into the cervical ICA origin but causing less than 50% stenosis. 3. Vertebral arteries are patent with antegrade flow bilaterally. 4. Degree of carotid stenosis reported correlate with diameter stenosis reduction as determined by NASCET criteria. Consider MRA or CTA to confirm. Electronically Signed  By: Almira Bar M.D.   On: 04/08/2023 03:36   CT HEAD WO CONTRAST ( )  Result Date: 04/08/2023 CLINICAL DATA:  Altered mental status, dizziness EXAM: CT HEAD WITHOUT CONTRAST TECHNIQUE: Contiguous axial images were obtained from the base of the skull through the vertex without intravenous contrast. RADIATION DOSE REDUCTION: This exam was performed according to the departmental dose-optimization program which includes automated exposure control, adjustment of the mA and/or kV according to patient size and/or use of iterative reconstruction technique. COMPARISON:  08/31/2020 FINDINGS: Brain: No evidence of acute infarction, hemorrhage, hydrocephalus, extra-axial collection or mass lesion/mass effect. Vascular: No hyperdense vessel or unexpected calcification. Skull: Normal. Negative for fracture or focal lesion. Sinuses/Orbits: The visualized paranasal sinuses are essentially clear. The mastoid air cells are unopacified. Other: None. IMPRESSION: Normal head CT. Electronically Signed   By: Charline Bills M.D.   On: 04/08/2023 02:02    EKG: fine Afib, RBBB, rate 104bpm (personally reviewed)  TELEMETRY: Afib with intermittent dropped beats, rates 70-90s (personally reviewed)    Assessment/Plan:  #) Sinus pauses #) perm Afib #) HTN  Presented to ER with sinus pauses after syncope at home.  Patient takes metop and dilt for rate mgmt of Afib, unknown last doses at this time Will need to wash-out BB and dilt  PRN hydral for HTN Pacer pads in place Continue eliquis for now, if PPM is indicated, will need to hold prior to procedure Continuous  tele         For questions or updates, please contact [facility] Please consult [email or web address] for contact info under Cardiology/STEMI.  Signed, Sherie Don, NP  04/08/2023 7:39 AM

## 2023-04-08 NOTE — ED Notes (Signed)
Pt continues to be quite anxious, taking some monitors off. Son coming to desk frequently for assistance reorienting pt.  Dr Jiles Harold for prn orders

## 2023-04-08 NOTE — Assessment & Plan Note (Addendum)
Continue PPI ?

## 2023-04-08 NOTE — ED Notes (Signed)
Pt continues to be confused. RN to room for bed alarm, pt attempting to get out of bed stating she needs to go back to the jewelry store. Husband at bedside unable to redirect pt.

## 2023-04-08 NOTE — ED Notes (Signed)
Pt not understanding use of purewick. Assisted to restroom with this RN and Mykia NT. Pt reports dizziness and shob with exertion.

## 2023-04-08 NOTE — Progress Notes (Signed)
       CROSS COVER NOTE  NAME: [name] [name] MRN: [id number] DOB : [birth year]    HPI/Events of Note   Report:patient still with agitation/impulsivity post haldol given 1805.  Nurse reports sinus pauses still noted 5-6 sec but asymptomatic  On review of chart:Patient admitted with syncopal episode causing fall and head trauma.  Syncopal event thought to be related to sinus pauses. Plan is to hold antiarrhythmics to determine if pacemeaker is needed. Patinet currently on cardiac progressive unit.     Assessment and  Interventions   Assessment: Heart rhythm a fib 95-105/ BP 158/80 Alert to self and appropriate conversation. Does sem to have some confusion with current situation No focal deficit Plan: Sitter needed at bedside for patient safety as medications for agitation/delirium contraindicated with her cardiac issues Continue to monitor on tele       Donnie Mesa NP [facility]

## 2023-04-08 NOTE — Assessment & Plan Note (Signed)
-   We will continue Eliquis. - We will hold off Lopressor and Cardizem CD for now. - The patient has a history of Mobitz type II second-degree heart block from amiodarone.

## 2023-04-08 NOTE — ED Notes (Signed)
Pt assisted to restroom.  

## 2023-04-08 NOTE — Assessment & Plan Note (Signed)
Baseline mental status is unclear, suspect at least some mild cognitive impairment.  Might be related to sinus pauses and her syncopal episode.  Head CT was non-acute. Family report memory issues intermittently at home --Delirium precautions --Avoid sedating meds as possible --Given low dose Xanax x 1 today for persistent agitation, restlessness  --Mgmt of underlying issues as outlined --Inquire baseline mentation from family as able

## 2023-04-08 NOTE — ED Notes (Signed)
Dr Jiles Harold for additional prn orders d/t pt unable to be redirected by husband, bed alarm or staff

## 2023-04-08 NOTE — ED Notes (Signed)
Pt very fidgety and anxious. Continuously being redirected.

## 2023-04-08 NOTE — ED Notes (Signed)
Pt continues to trigger bed alarm

## 2023-04-08 NOTE — Assessment & Plan Note (Deleted)
Due to sinus pauses.   --Mgmt of pauses as outlined --Fall precautions --Orthostatic vitals 

## 2023-04-08 NOTE — Hospital Course (Signed)
HPI on admission 04/07/23 PM by Dr. Arville Care: "[name] [name] is a 87 y.o. 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  She has been having Alterman status and generalized weakness.  After syncope she had subsequent nausea and vomiting without bilious vomitus or hematemesis.  No paresthesias or focal muscle weakness.  She admitted to mild dyspnea.  No cough or wheezing.  She denies any chest pain or palpitations.  No fever or chills.  No dysuria, oliguria or hematuria or flank pain.  She stated that she has not been feeling good since yesterday.  The patient was noted to have sinus pauses by EMS as well as in the ER.  She was given atropine by EMS.  She denies any headache, dizziness or blurry vision or diplopia.   ED Course: Upon presentation to the emergency room, heart rate was 102 and respiratory rate 22 with otherwise normal vital signs.  Later on heart rate was 95.  Labs revealed hyponatremia 131 and blood glucose of 186 with otherwise normal BMP.  High sensitive troponin I was 13 and later 17 and CBC was within normal.  UA came back negative EKG as reviewed by me : EKG "  Patient was admitted to cardiac telemetry with Cardiology consulted.  Cardizem and Lopressor were held.  Pacer pads were placed in case of need for transcutaneous pacing.

## 2023-04-08 NOTE — Assessment & Plan Note (Signed)
Sinus pauses up to 6 sec noted by EMS.  In setting of rate control agents for Permanent A-fib --Hold Cardizem and Lopressor --Monitor on telemetry --Atropine PRN for symptomatic pauses/bradycardia --Pacer pads in place --Allow time for rate control meds to wash out --May require pacemaker  --Cardiology following, see their recommendations

## 2023-04-08 NOTE — ED Notes (Signed)
Pt continues to be anxious, disoriented.

## 2023-04-08 NOTE — Progress Notes (Signed)
*  PRELIMINARY RESULTS* Echocardiogram 2D Echocardiogram has been performed.  Cristela Blue 04/08/2023, 8:36 AM

## 2023-04-08 NOTE — ED Notes (Signed)
Pt eating dinner tray °

## 2023-04-08 NOTE — Assessment & Plan Note (Signed)
Due to sinus pauses.   --Mgmt of pauses as outlined --Fall precautions --Orthostatic vitals

## 2023-04-09 DIAGNOSIS — I4821 Permanent atrial fibrillation: Secondary | ICD-10-CM | POA: Diagnosis not present

## 2023-04-09 DIAGNOSIS — R55 Syncope and collapse: Secondary | ICD-10-CM | POA: Diagnosis not present

## 2023-04-09 DIAGNOSIS — I455 Other specified heart block: Secondary | ICD-10-CM | POA: Diagnosis not present

## 2023-04-09 DIAGNOSIS — E876 Hypokalemia: Secondary | ICD-10-CM | POA: Diagnosis not present

## 2023-04-09 DIAGNOSIS — R001 Bradycardia, unspecified: Secondary | ICD-10-CM

## 2023-04-09 LAB — BASIC METABOLIC PANEL
Anion gap: 10 (ref 5–15)
BUN: 8 mg/dL (ref 8–23)
CO2: 23 mmol/L (ref 22–32)
Calcium: 8.6 mg/dL — ABNORMAL LOW (ref 8.9–10.3)
Chloride: 105 mmol/L (ref 98–111)
Creatinine, Ser: 0.6 mg/dL (ref 0.44–1.00)
GFR, Estimated: >60 mL/min (ref >60)
Glucose, Bld: 110 mg/dL — ABNORMAL HIGH (ref 70–99)
Potassium: 3.1 mmol/L — ABNORMAL LOW (ref 3.5–5.1)
Sodium: 138 mmol/L (ref 135–145)

## 2023-04-09 LAB — MAGNESIUM: Magnesium: 2 mg/dL (ref 1.7–2.4)

## 2023-04-09 MED ORDER — METOPROLOL TARTRATE 5 MG/5ML IV SOLN
5.0000 mg | Freq: Four times a day (QID) | INTRAVENOUS | Status: DC | PRN
  Administered 2023-04-09 – 2023-04-10 (×2): 5 mg via INTRAVENOUS
  Filled 2023-04-09 (×2): qty 5

## 2023-04-09 MED ORDER — AMLODIPINE BESYLATE 5 MG PO TABS
5.0000 mg | ORAL_TABLET | Freq: Every day | ORAL | Status: DC
  Administered 2023-04-09 – 2023-04-12 (×4): 5 mg via ORAL
  Filled 2023-04-09 (×4): qty 1

## 2023-04-09 MED ORDER — POTASSIUM CHLORIDE CRYS ER 20 MEQ PO TBCR
40.0000 meq | EXTENDED_RELEASE_TABLET | ORAL | Status: AC
  Administered 2023-04-09 (×2): 40 meq via ORAL
  Filled 2023-04-09 (×2): qty 2

## 2023-04-09 NOTE — Progress Notes (Signed)
At approximately 0430 hrs., pt's HR sustaining in the 160s. RN notified NP Jon Billings and cardiology, MD Graciela Husbands. EKG obtained at which time pt's HR came back down between 98-105 (NSR/ST). Per cardiology, RN also obtained orthostatic vital signs. NP came to bedside to check on the patient.

## 2023-04-09 NOTE — Progress Notes (Signed)
Progress Note  Patient Name: [name] [name] Date of Encounter: 04/09/2023  Primary Cardiologist: Kirke Corin  Subjective   Pauses have improved with washout of diltiazem and metoprolol with longest R-R interval currently 2/13 seconds occurring last evening around 1030 PM. SHe did have a brief episode of Afib with RVR this morning around 0430 with ventricular rates into the 160s bpm. Overall, ventricular rates have been well controlled in the 90s bpm.   No chest pain or dyspnea. Did feel palpitations during episode of Afib with RVR this morning. Complaining of a toothache.   Inpatient Medications    Scheduled Meds:  apixaban  5 mg Oral BID   cholecalciferol  1,000 Units Oral Daily   pantoprazole  40 mg Oral Daily   potassium chloride  40 mEq Oral Q4H   pravastatin  20 mg Oral Daily   Continuous Infusions:  PRN Meds: acetaminophen **OR** acetaminophen, alum & mag hydroxide-simeth, hydrALAZINE, magnesium hydroxide, ondansetron **OR** ondansetron (ZOFRAN) IV, polyvinyl alcohol, sodium chloride, traZODone   Vital Signs    Vitals:   04/09/23 0501 04/09/23 0700 04/09/23 0800 04/09/23 0830  BP: (!) 148/75 103/72 139/73   Pulse: 96 97 100 99  Resp: 20 (!) 24 (!) 22 19  Temp:      TempSrc:      SpO2: 95% 95% 96% 96%    Intake/Output Summary (Last 24 hours) at 04/09/2023 1026 Last data filed at 04/09/2023 0700 Gross per 24 hour  Intake 3203.99 ml  Output 500 ml  Net 2703.99 ml   There were no vitals filed for this visit.  Telemetry    Afib with ventriclar rates overall in the 90s bpm with brief episode into the 160s bpm around 0430 this morning, longest R-R interval 2.13 seconds around 10 30 PM last night - Personally Reviewed  ECG    Afib, 98 bpm, RBBB - Personally Reviewed  Physical Exam   GEN: No acute distress.   Neck: No JVD. Cardiac: IRIR, no murmurs, rubs, or gallops.  Respiratory: Clear to auscultation bilaterally.  GI: Soft, nontender, non-distended.   MS: No  edema; No deformity. Neuro:  Alert and oriented x 2 (person and place); Nonfocal.  Psych: Normal affect.  Labs    Chemistry Recent Labs  Lab 04/07/23 1733 04/07/23 2014 04/08/23 0516 04/09/23 0458  NA 131*  --  136 138  K 4.2  --  3.9 3.1*  CL 100  --  109 105  CO2 24  --  23 23  GLUCOSE 186*  --  132* 110*  BUN 20  --  16 8  CREATININE 0.89 0.78 0.77 0.60  CALCIUM 8.9  --  9.0 8.6*  GFRNONAA >60 >60 >60 >60  ANIONGAP 7  --  6 10     Hematology Recent Labs  Lab 04/07/23 1733 04/07/23 2014 04/08/23 0516  WBC 9.8 8.7 10.2  RBC 4.06 3.90 3.91  HGB 12.7 12.3 12.2  HCT 39.0 37.7 38.0  MCV 96.1 96.7 97.2  MCH 31.3 31.5 31.2  MCHC 32.6 32.6 32.1  RDW 13.2 13.2 13.2  PLT 287 277 282    Cardiac EnzymesNo results for input(s): "TROPONINI" in the last 168 hours. No results for input(s): "TROPIPOC" in the last 168 hours.   BNPNo results for input(s): "BNP", "PROBNP" in the last 168 hours.   DDimer No results for input(s): "DDIMER" in the last 168 hours.   Radiology    US Carotid Bilateral  Result Date: 04/08/2023 IMPRESSION: 1. Heterogeneous mixed  plaques at the left carotid bulb extending into the cervical ICA with estimated 60-70% proximal left ICA stenosis by Doppler criteria. 2. Mild hypoechoic nonstenosing soft plaque at the right carotid bifurcation, extending into the cervical ICA origin but causing less than 50% stenosis. 3. Vertebral arteries are patent with antegrade flow bilaterally. 4. Degree of carotid stenosis reported correlate with diameter stenosis reduction as determined by NASCET criteria. Consider MRA or CTA to confirm. Electronically Signed   By: Almira Bar M.D.   On: 04/08/2023 03:36   CT HEAD WO CONTRAST ( )  Result Date: 04/08/2023 IMPRESSION: Normal head CT. Electronically Signed   By: Charline Bills M.D.   On: 04/08/2023 02:02    Cardiac Studies   2D echo 04/08/2023: 1. Left ventricular ejection fraction, by estimation, is 55 to 60%.  The  left ventricle has normal function. The left ventricle has no regional  wall motion abnormalities. Left ventricular diastolic parameters are  indeterminate.   2. Right ventricular systolic function is normal. The right ventricular  size is mildly enlarged. There is moderately elevated pulmonary artery  systolic pressure. The estimated right ventricular systolic pressure is  51.8 mmHg.   3. Left atrial size was severely dilated.   4. Right atrial size was severely dilated.   5. The mitral valve is normal in structure. Mild to moderate mitral valve  regurgitation. No evidence of mitral stenosis.   6. Tricuspid valve regurgitation is mild to moderate.   7. The aortic valve is normal in structure. Aortic valve regurgitation is  not visualized. No aortic stenosis is present.   8. The inferior vena cava is normal in size with greater than 50%  respiratory variability, suggesting right atrial pressure of 3 mmHg.   Patient Profile     87 y.o. female with history of nonobstructive CAD by LHC in 2012 and 07/2019, permanent A-fib status post multiple cardioversions in the past, atrial flutter, tachycardia-induced cardiomyopathy, HTN, and HLD who we are seeing for pauses.   Assessment & Plan    1. Permanent Afib with pauses and possible tachybrady syndrome: -Overall, pauses have improved with washout of metoprolol and diltiazem -No current indication for venous temp wire or emergent PPM -Pacer pads and atropine at bedside -Monitor on tele -Evaluated by EP with recommendation to washout diltiazem and metoprolol as above -Will need to monitor ventricular rates off AV nodal blocking medications given concern for possible tachybrady syndrome -Ambulate this afternoon to assess ventricular rates off AV nodal blocking medications, I suspect she may develop RVR with this -Suspect she may need pacemaker implantation for tachybrady syndrome, will defer this to EP -Remains on Eliquis for now, will need  to be held if PPM is indicated -Magnesium and TSH normal -Replete potassium to goal 4.0 -Ongoing management per EP  2. Nonobstructive CAD: -No symptoms of angina -Ruled out -PTA Eliquis in place of ASA, does not meet reduced dosing criteria of Eliquis -PTA statin -No plans for inpatient ischemic testing  3. Hypokalemia: -Repletion ordered     For questions or updates, please contact [facility] Please consult [email or web address] for contact info under Cardiology/STEMI.    Signed, Eula Listen, PA-C [facility] Pager: [phone] 04/09/2023, 10:26 AM

## 2023-04-09 NOTE — Assessment & Plan Note (Signed)
Resolved with replacment --Monitor BMP --Replace K for goal >4

## 2023-04-09 NOTE — Progress Notes (Addendum)
Progress Note   [name] ZOX:[id number] DOB: 02/22/1936 DOA: 04/07/2023     1 DOS: the patient was seen and examined on 04/09/2023   Brief hospital course: HPI on admission 04/07/23 PM by Dr. Arville Care: "[name] [name] is a 87 y.o. 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  She has been having Alterman status and generalized weakness.  After syncope she had subsequent nausea and vomiting without bilious vomitus or hematemesis.  No paresthesias or focal muscle weakness.  She admitted to mild dyspnea.  No cough or wheezing.  She denies any chest pain or palpitations.  No fever or chills.  No dysuria, oliguria or hematuria or flank pain.  She stated that she has not been feeling good since yesterday.  The patient was noted to have sinus pauses by EMS as well as in the ER.  She was given atropine by EMS.  She denies any headache, dizziness or blurry vision or diplopia.   ED Course: Upon presentation to the emergency room, heart rate was 102 and respiratory rate 22 with otherwise normal vital signs.  Later on heart rate was 95.  Labs revealed hyponatremia 131 and blood glucose of 186 with otherwise normal BMP.  High sensitive troponin I was 13 and later 17 and CBC was within normal.  UA came back negative EKG as reviewed by me : EKG "  Patient was admitted to cardiac telemetry with Cardiology consulted.  Cardizem and Lopressor were held.  Pacer pads were placed in case of need for transcutaneous pacing.    Assessment and Plan: * Syncope and collapse Due to sinus pauses.   --Mgmt of pauses as outlined --Fall precautions --Orthostatic vitals  Sinus  pause Sinus pauses up to 6 sec noted by EMS.  In setting of rate control agents for Permanent A-fib --Hold Cardizem and Lopressor --Monitor on telemetry --Atropine PRN for symptomatic pauses/bradycardia --Pacer pads in place --Allow time for rate control meds to wash out --May require pacemaker  --Cardiology following, see their recommendations  Paroxysmal atrial fibrillation-resolved as of 04/08/2023 - We will continue Eliquis. - We will hold off Lopressor and Cardizem CD for now. - The patient has a history of Mobitz type II second-degree heart block from amiodarone.  Essential hypertension IV hydralazine PRN for now. Holding Cardizem and Lopressor  Hypokalemia K 3.1 this AM, normal on admission --Replace with 40 mEq x 2 PO K-Cl  Confusion Baseline mental status is unclear, suspect at least some mild cognitive impairment.  Might be related to sinus pauses and her syncopal episode.  Head CT was non-acute. --Delirium precautions --Avoid sedating meds as possible --Given low dose Xanax x 1 today for persistent agitation, restlessness  --Mgmt of underlying issues as outlined --Inquire baseline mentation from family as able  GERD without esophagitis Continue PPI   Persistent atrial fibrillation Continue Eliquis for now, hold on Saturday 4/20 if persistent sinus pauses after wash out of rate control agents. Hold Cardizem, Lopressor Telemetry monitoring Cardiology following        Subjective: Pt sitting up in bed, husband and sitter at bedside this AM.  Cardiology PA finishing up encounter, expects potential need for pacemaker.  Pt remains pleasantly confused, denies  complaints other than tooth pain where she recently had (vs will soon have) a tooth extracted.   She had A-fib RVR overnight, rates better this AM.   Physical Exam: Vitals:   04/09/23 0501 04/09/23 0700 04/09/23 0800 04/09/23 0830  BP: (!) 148/75 103/72 139/73   Pulse: 96 97 100 99  Resp: 20 (!) 24 (!) 22  19  Temp:      TempSrc:      SpO2: 95% 95% 96% 96%   General exam: awake, alert, no acute distress, confused, pleasant HEENT: wearing glasses, moist mucus membranes, hearing grossly normal  Respiratory system: CTAB, no wheezes, rales or rhonchi, normal respiratory effort. Cardiovascular system: normal S1/S2, irregular rhythm, regular rate, no JVD, murmurs, rubs, gallops, no pedal edema.   Gastrointestinal system: soft, NT, ND Central nervous system: A&O x self. no gross focal neurologic deficits, normal speech Extremities: moves all , no edema, normal tone Skin: dry, intact, normal temperature Psychiatry: normal mood, congruent affect, abnormal judgement and insight    Data Reviewed:  Notable labs --- K 3.1, glucose 110, Ca 8.6 otherwise normal BMP   Echocardiogram -- EF 55-60%, indeterminate diastolic parameters, moderately elevated PASP, severely dilated right and left atria, mild-moderate MR, mild-moderate TR  Family Communication: husband at bedside  Disposition: Status is: Inpatient Remains inpatient appropriate because: Ongoing evaluation of sinus pauses, syncope   Planned Discharge Destination: Home    Time spent: 35 minutes  Author: Pennie Banter, DO 04/09/2023 2:25 PM  For on call review [email or web address].

## 2023-04-10 DIAGNOSIS — I4821 Permanent atrial fibrillation: Secondary | ICD-10-CM | POA: Diagnosis not present

## 2023-04-10 DIAGNOSIS — I10 Essential (primary) hypertension: Secondary | ICD-10-CM | POA: Diagnosis not present

## 2023-04-10 DIAGNOSIS — R55 Syncope and collapse: Secondary | ICD-10-CM | POA: Diagnosis not present

## 2023-04-10 LAB — BASIC METABOLIC PANEL
Anion gap: 10 (ref 5–15)
BUN: 9 mg/dL (ref 8–23)
CO2: 22 mmol/L (ref 22–32)
Calcium: 9.1 mg/dL (ref 8.9–10.3)
Chloride: 102 mmol/L (ref 98–111)
Creatinine, Ser: 0.69 mg/dL (ref 0.44–1.00)
GFR, Estimated: >60 mL/min (ref >60)
Glucose, Bld: 165 mg/dL — ABNORMAL HIGH (ref 70–99)
Potassium: 4 mmol/L (ref 3.5–5.1)
Sodium: 134 mmol/L — ABNORMAL LOW (ref 135–145)

## 2023-04-10 LAB — APTT: aPTT: 115 seconds — ABNORMAL HIGH (ref 24–36)

## 2023-04-10 MED ORDER — AMOXICILLIN 500 MG PO CAPS
500.0000 mg | ORAL_CAPSULE | Freq: Three times a day (TID) | ORAL | Status: DC
  Administered 2023-04-10 – 2023-04-14 (×10): 500 mg via ORAL
  Filled 2023-04-10 (×12): qty 1

## 2023-04-10 MED ORDER — RISAQUAD PO CAPS
1.0000 | ORAL_CAPSULE | Freq: Every day | ORAL | Status: DC
  Administered 2023-04-11 – 2023-04-14 (×3): 1 via ORAL
  Filled 2023-04-10 (×3): qty 1

## 2023-04-10 MED ORDER — HEPARIN (PORCINE) 25000 UT/250ML-% IV SOLN
950.0000 [IU]/h | INTRAVENOUS | Status: AC
  Administered 2023-04-10: 1100 [IU]/h via INTRAVENOUS
  Administered 2023-04-11 – 2023-04-12 (×2): 950 [IU]/h via INTRAVENOUS
  Filled 2023-04-10 (×3): qty 250

## 2023-04-10 MED ORDER — METOPROLOL TARTRATE 25 MG PO TABS
12.5000 mg | ORAL_TABLET | Freq: Two times a day (BID) | ORAL | Status: DC
  Administered 2023-04-10 – 2023-04-12 (×6): 12.5 mg via ORAL
  Filled 2023-04-10 (×6): qty 1

## 2023-04-10 MED ORDER — METOPROLOL TARTRATE 25 MG PO TABS: 12.5000 mg | ORAL_TABLET | Freq: Two times a day (BID) | ORAL | Status: DC

## 2023-04-10 MED ORDER — POTASSIUM CHLORIDE CRYS ER 20 MEQ PO TBCR
40.0000 meq | EXTENDED_RELEASE_TABLET | Freq: Two times a day (BID) | ORAL | Status: AC
  Administered 2023-04-10 (×2): 40 meq via ORAL
  Filled 2023-04-10 (×2): qty 2

## 2023-04-10 NOTE — Progress Notes (Signed)
ANTICOAGULATION CONSULT NOTE  Pharmacy Consult for heparin infusion Indication: atrial fibrillation  Allergies  Allergen Reactions   Cyclobenzaprine Other (See Comments)    Dropped blood pressure and heart rate, "talking out of my mind"   Iodine Anaphylaxis    NO PROBLEMS WITH BETADINE   Amiodarone     Cannot remember    Biaxin [Clarithromycin] Nausea And Vomiting   Codeine Nausea And Vomiting   Digoxin And Related Other (See Comments)    Fatigue, eye puffiness, hoarsness   Enalapril Other (See Comments)    unknown   Famotidine     Caused dizziness   Fluocinonide Other (See Comments)    Tingling sensation in head and redness to scalp.   Iodinated Contrast Media Other (See Comments)    Tachycardia   Iodine-131    Losartan Potassium Other (See Comments)    Dizzy and headache   Omnicef [Cefdinir]    Oxycodone Nausea And Vomiting   Promethazine Other (See Comments)    Unknown    Sertraline     Dizziness and weakness   Tizanidine Hcl Other (See Comments)    hypotension    Warfarin And Related     Bleeding    Xarelto [Rivaroxaban] Other (See Comments)    bleeding    Patient Measurements:   Heparin Dosing Weight: 71.7 kg (measured 02/2023)  Vital Signs: Temp: 98.4 F (36.9 C) (04/21 0513) Temp Source: Oral (04/21 0513) BP: 147/90 (04/21 0513) Pulse Rate: 102 (04/21 0513)  Labs: Recent Labs    04/07/23 1733 04/07/23 2014 04/08/23 0516 04/09/23 0458  HGB 12.7 12.3 12.2  --   HCT 39.0 37.7 38.0  --   PLT 287 277 282  --   CREATININE 0.89 0.78 0.77 0.60  TROPONINIHS 13 17  --   --     CrCl cannot be calculated (Unknown ideal weight.).   Medical History: Past Medical History:  Diagnosis Date   Actinic keratosis    Arthritis    Atrial flutter 02/2011   s/p cardioversion    Chest pain    a. H/o cardiac cath x 2-> 2012 -->nl cors;  b. 12/2016 MV: EF 61%, small region of mild perfusion defect in the apical anteroseptal region c/w breast attenuation, no  ischemia-->Low risk; c. 06/2019 MV: Mod size, mild inflat ischemia, EF 59%. Cor and Ao Ca2+. Inflat defect more pronounced on this study compared to last; c. 07/2019 Cath: LM nl, LAD min irregs, LCX nl, OM1/2/3 nl, RCA min irregs.   CKD (chronic kidney disease), stage III    Concussion with no loss of consciousness 09/01/2016   Cystocele    Degenerative disorder of bone    GERD (gastroesophageal reflux disease)    Headache(784.0)    chronic   Hiatal hernia    Hyperlipidemia    Hypertension    Influenza 01/10/2017   Knee fracture    Migraines    Mobitz type 2 second degree atrioventricular block    a. felt to be 2/2 amiodarone, resolved with decreased amiodarone dose.  Amio since d/c'd.   Permanent atrial fibrillation    a. status post multiple DCCVs; b. 2018 - eval for PVI but opted for rate control.   PONV (postoperative nausea and vomiting)    oxycodone and codiene cause N/V    Pre-syncope    a. In setting of dehydration and AKI in the past.   Sleep apnea    Squamous cell carcinoma of skin 11/10/2020   left distal posterior deltoid (EDC  01/15/2021)   Tachycardia induced cardiomyopathy    a. Resolved;  b. 08/2017 Echo: EF 50-55%, no rwma, mild MR, mildly to mod dil LA/RA; c. 02/2018 Echo: EF 55-60%, mild MR. Mildly dil LA. Nl RVSP. PASP .   Venous insufficiency    Vertigo     Medications:  Scheduled:   amLODipine  5 mg Oral Daily   cholecalciferol  1,000 Units Oral Daily   metoprolol tartrate  12.5 mg Oral BID   pantoprazole  40 mg Oral Daily   potassium chloride  40 mEq Oral BID   pravastatin  20 mg Oral Daily    Assessment: 87 y.o.  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 She takes apixaban with last dose 04/20 2039   Goal of Therapy:  Heparin level 0.3-0.7 units/ml aPTT 66 - 102   seconds Monitor platelets by anticoagulation protocol: Yes   Plan:  ---Start heparin infusion at 1100 units/hr (avoid bolus ISO recent apixaban administration) ---Check aPTT level in 8 hours (will use aPTT to guide therapy until anti-Xa and aPTT correlate) ---Continue to monitor H&H and platelets  [name] [name] 04/10/2023,8:34 AM

## 2023-04-10 NOTE — Progress Notes (Signed)
Progress Note  Patient Name: [name] [name] Date of Encounter: 04/10/2023  Primary Cardiologist: Kirke Corin  Subjective   Pauses have improved with washout of diltiazem and metoprolol. However, she is now having frequent tachycardic episodes, up into the 180s bpm. Currently tachycardic with rates in the 170s bpm with associated palpitations. No chest pain. Continues to focus on toothache.    Inpatient Medications    Scheduled Meds:  amLODipine  5 mg Oral Daily   apixaban  5 mg Oral BID   cholecalciferol  1,000 Units Oral Daily   pantoprazole  40 mg Oral Daily   pravastatin  20 mg Oral Daily   Continuous Infusions:  PRN Meds: acetaminophen **OR** acetaminophen, alum & mag hydroxide-simeth, hydrALAZINE, magnesium hydroxide, metoprolol tartrate, ondansetron **OR** ondansetron (ZOFRAN) IV, polyvinyl alcohol, sodium chloride, traZODone   Vital Signs    Vitals:   04/09/23 1928 04/09/23 2306 04/10/23 0313 04/10/23 0513  BP: (!) 151/93 (!) 142/73 (!) 142/81 (!) 147/90  Pulse: (!) 106 (!) 108 (!) 114 (!) 102  Resp: Temp: (!) 97.5 F (36.4 C) 97.9 F (36.6 C) 98.4 F (36.9 C) 98.4 F (36.9 C)  TempSrc: Oral Oral Oral Oral  SpO2: 96% 94% 95%     Intake/Output Summary (Last 24 hours) at 04/10/2023 1610 Last data filed at 04/09/2023 1914 Gross per 24 hour  Intake 500 ml  Output --  Net 500 ml    There were no vitals filed for this visit.  Telemetry    Afib with ventriclar rates overall in the low 100s bpm with more frequent episodes of RVR into the 180s bpm, no further significant prolonged R-R intervals - Personally Reviewed  ECG    No new tracings - Personally Reviewed  Physical Exam   GEN: No acute distress.   Neck: No JVD. Cardiac: Tachycardic, no murmurs, rubs, or gallops.  Respiratory: Clear to auscultation bilaterally.  GI: Soft, nontender, non-distended.   MS: No edema; No deformity. Neuro:  Alert and oriented x 3; Nonfocal.  Psych: Normal  affect.  Labs    Chemistry Recent Labs  Lab 04/07/23 1733 04/07/23 2014 04/08/23 0516 04/09/23 0458  NA 131*  --  136 138  K 4.2  --  3.9 3.1*  CL 100  --  109 105  CO2 24  --  23 23  GLUCOSE 186*  --  132* 110*  BUN 20  --  16 8  CREATININE 0.89 0.78 0.77 0.60  CALCIUM 8.9  --  9.0 8.6*  GFRNONAA >60 >60 >60 >60  ANIONGAP 7  --  6 10      Hematology Recent Labs  Lab 04/07/23 1733 04/07/23 2014 04/08/23 0516  WBC 9.8 8.7 10.2  RBC 4.06 3.90 3.91  HGB 12.7 12.3 12.2  HCT 39.0 37.7 38.0  MCV 96.1 96.7 97.2  MCH 31.3 31.5 31.2  MCHC 32.6 32.6 32.1  RDW 13.2 13.2 13.2  PLT 287 277 282     Cardiac EnzymesNo results for input(s): "TROPONINI" in the last 168 hours. No results for input(s): "TROPIPOC" in the last 168 hours.   BNPNo results for input(s): "BNP", "PROBNP" in the last 168 hours.   DDimer No results for input(s): "DDIMER" in the last 168 hours.   Radiology    US Carotid Bilateral  Result Date: 04/08/2023 IMPRESSION: 1. Heterogeneous mixed plaques at the left carotid bulb extending into the cervical ICA with estimated 60-70% proximal left ICA stenosis by Doppler criteria. 2.  Mild hypoechoic nonstenosing soft plaque at the right carotid bifurcation, extending into the cervical ICA origin but causing less than 50% stenosis. 3. Vertebral arteries are patent with antegrade flow bilaterally. 4. Degree of carotid stenosis reported correlate with diameter stenosis reduction as determined by NASCET criteria. Consider MRA or CTA to confirm. Electronically Signed   By: Almira Bar M.D.   On: 04/08/2023 03:36   CT HEAD WO CONTRAST ( )  Result Date: 04/08/2023 IMPRESSION: Normal head CT. Electronically Signed   By: Charline Bills M.D.   On: 04/08/2023 02:02    Cardiac Studies   2D echo 04/08/2023: 1. Left ventricular ejection fraction, by estimation, is 55 to 60%. The  left ventricle has normal function. The left ventricle has no regional  wall motion  abnormalities. Left ventricular diastolic parameters are  indeterminate.   2. Right ventricular systolic function is normal. The right ventricular  size is mildly enlarged. There is moderately elevated pulmonary artery  systolic pressure. The estimated right ventricular systolic pressure is  51.8 mmHg.   3. Left atrial size was severely dilated.   4. Right atrial size was severely dilated.   5. The mitral valve is normal in structure. Mild to moderate mitral valve  regurgitation. No evidence of mitral stenosis.   6. Tricuspid valve regurgitation is mild to moderate.   7. The aortic valve is normal in structure. Aortic valve regurgitation is  not visualized. No aortic stenosis is present.   8. The inferior vena cava is normal in size with greater than 50%  respiratory variability, suggesting right atrial pressure of 3 mmHg.   Patient Profile     87 y.o. female with history of nonobstructive CAD by LHC in 2012 and 07/2019, permanent A-fib status post multiple cardioversions in the past, atrial flutter, tachycardia-induced cardiomyopathy, HTN, and HLD who we are seeing for pauses.   Assessment & Plan    1. Permanent Afib with pauses and possible tachybrady syndrome: -Overall, pauses have improved with washout of metoprolol and diltiazem -However, off AV nodal blocking medications, she is having frequent episodes of RVR, up into the 180s bpm -Start low dose Lopressor 12.5 mg bid, monitor on tele -Patient has tachybrady syndrome and will likely require pacemaker implantation prior to discharge -Given increase in RVR, add low dose metoprolol 12.5 mg bid -EP to reevaluate Monday for consideration of PPM, will stop Eliquis today and place on heparin gtt to allow for washout of OAC in case EP elects to proceed with PPM -No current indication for venous temp wire or emergent PPM -Pacer pads and atropine at bedside -Monitor on tele -Magnesium and TSH normal -Replete potassium to goal  4.0 -Ongoing management per EP  2. Nonobstructive CAD: -No symptoms of angina -Ruled out -PTA Eliquis in place of ASA, does not meet reduced dosing criteria of Eliquis -PTA statin -No plans for inpatient ischemic testing  3. Hypokalemia: -Repletion ordered     For questions or updates, please contact [facility] Please consult [email or web address] for contact info under Cardiology/STEMI.    Signed, Eula Listen, PA-C [facility] Pager: [phone] 04/10/2023, 8:08 AM

## 2023-04-10 NOTE — Progress Notes (Signed)
ANTICOAGULATION CONSULT NOTE  Pharmacy Consult for heparin infusion Indication: atrial fibrillation  Allergies  Allergen Reactions   Cyclobenzaprine Other (See Comments)    Dropped blood pressure and heart rate, "talking out of my mind"   Iodine Anaphylaxis    NO PROBLEMS WITH BETADINE   Amiodarone     Cannot remember    Biaxin [Clarithromycin] Nausea And Vomiting   Codeine Nausea And Vomiting   Digoxin And Related Other (See Comments)    Fatigue, eye puffiness, hoarsness   Enalapril Other (See Comments)    unknown   Famotidine     Caused dizziness   Fluocinonide Other (See Comments)    Tingling sensation in head and redness to scalp.   Iodinated Contrast Media Other (See Comments)    Tachycardia   Iodine-131    Losartan Potassium Other (See Comments)    Dizzy and headache   Omnicef [Cefdinir]    Oxycodone Nausea And Vomiting   Promethazine Other (See Comments)    Unknown    Sertraline     Dizziness and weakness   Tizanidine Hcl Other (See Comments)    hypotension    Warfarin And Related     Bleeding    Xarelto [Rivaroxaban] Other (See Comments)    bleeding    Patient Measurements:   Heparin Dosing Weight: 71.7 kg (measured 02/2023)  Vital Signs: Temp: 97.6 F (36.4 C) (04/21 1144) Temp Source: Oral (04/21 0839) BP: 151/86 (04/21 1648) Pulse Rate: 106 (04/21 1648)  Labs: Recent Labs    04/07/23 2014 04/08/23 0516 04/09/23 0458 04/10/23 0936 04/10/23 1748  HGB 12.3 12.2  --   --   --   HCT 37.7 38.0  --   --   --   PLT 277 282  --   --   --   APTT  --   --   --   --  115*  CREATININE 0.78 0.77 0.60 0.69  --   TROPONINIHS 17  --   --   --   --      CrCl cannot be calculated (Unknown ideal weight.).   Medical History: Past Medical History:  Diagnosis Date   Actinic keratosis    Arthritis    Atrial flutter 02/2011   s/p cardioversion    Chest pain    a. H/o cardiac cath x 2-> 2012 -->nl cors;  b. 12/2016 MV: EF 61%, small region of mild  perfusion defect in the apical anteroseptal region c/w breast attenuation, no ischemia-->Low risk; c. 06/2019 MV: Mod size, mild inflat ischemia, EF 59%. Cor and Ao Ca2+. Inflat defect more pronounced on this study compared to last; c. 07/2019 Cath: LM nl, LAD min irregs, LCX nl, OM1/2/3 nl, RCA min irregs.   CKD (chronic kidney disease), stage III    Concussion with no loss of consciousness 09/01/2016   Cystocele    Degenerative disorder of bone    GERD (gastroesophageal reflux disease)    Headache(784.0)    chronic   Hiatal hernia    Hyperlipidemia    Hypertension    Influenza 01/10/2017   Knee fracture    Migraines    Mobitz type 2 second degree atrioventricular block    a. felt to be 2/2 amiodarone, resolved with decreased amiodarone dose.  Amio since d/c'd.   Permanent atrial fibrillation    a. status post multiple DCCVs; b. 2018 - eval for PVI but opted for rate control.   PONV (postoperative nausea and vomiting)    oxycodone  and codiene cause N/V    Pre-syncope    a. In setting of dehydration and AKI in the past.   Sleep apnea    Squamous cell carcinoma of skin 11/10/2020   left distal posterior deltoid (EDC 01/15/2021)   Tachycardia induced cardiomyopathy    a. Resolved;  b. 08/2017 Echo: EF 50-55%, no rwma, mild MR, mildly to mod dil LA/RA; c. 02/2018 Echo: EF 55-60%, mild MR. Mildly dil LA. Nl RVSP. PASP .   Venous insufficiency    Vertigo     Medications:  Scheduled:   [START ON 04/11/2023] acidophilus  1 capsule Oral Daily   amLODipine  5 mg Oral Daily   amoxicillin  500 mg Oral TID   cholecalciferol  1,000 Units Oral Daily   metoprolol tartrate  12.5 mg Oral BID   pantoprazole  40 mg Oral Daily   potassium chloride  40 mEq Oral BID   pravastatin  20 mg Oral Daily    Assessment: 87 y.o.  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 She takes apixaban with last dose 04/20 2039   Goal of Therapy:  Heparin level 0.3-0.7 units/ml aPTT 66 - 102  seconds Monitor platelets by anticoagulation protocol: Yes   0421 1748 aPTT 115 sec, supratherapeutic  Plan: aPTT is supratherapeutic  Reduce heparin infusion rate to 950 units/hr Recheck aPTT 8 hours after rate change Adjust based on aPTT until correlation with HL Daily CBC while on heparin  Barrie Folk, PharmD 04/10/2023,6:10 PM

## 2023-04-10 NOTE — Progress Notes (Addendum)
Progress Note   [name] ZOX:[id number] DOB: [birth year] DOA: 04/07/2023     2 DOS: the patient was seen and examined on 04/10/2023   Brief hospital course: HPI on admission 04/07/23 PM by Dr. Arville Care: "[name] [name] is a 87 y.o. 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  She has been having Alterman status and generalized weakness.  After syncope she had subsequent nausea and vomiting without bilious vomitus or hematemesis.  No paresthesias or focal muscle weakness.  She admitted to mild dyspnea.  No cough or wheezing.  She denies any chest pain or palpitations.  No fever or chills.  No dysuria, oliguria or hematuria or flank pain.  She stated that she has not been feeling good since yesterday.  The patient was noted to have sinus pauses by EMS as well as in the ER.  She was given atropine by EMS.  She denies any headache, dizziness or blurry vision or diplopia.   ED Course: Upon presentation to the emergency room, heart rate was 102 and respiratory rate 22 with otherwise normal vital signs.  Later on heart rate was 95.  Labs revealed hyponatremia 131 and blood glucose of 186 with otherwise normal BMP.  High sensitive troponin I was 13 and later 17 and CBC was within normal.  UA came back negative EKG as reviewed by me : EKG "  Patient was admitted to cardiac telemetry with Cardiology consulted.  Cardizem and Lopressor were held.  Pacer pads were placed in case of need for transcutaneous pacing.    Assessment and Plan: * Syncope and collapse Due to sinus pauses.   --Mgmt of pauses as outlined --Fall precautions --Orthostatic vitals  Sinus  pause Sinus pauses up to 6 sec noted by EMS.  In setting of rate control agents for Permanent A-fib --Hold Cardizem and Lopressor --Monitor on telemetry --Atropine PRN for symptomatic pauses/bradycardia --Pacer pads in place --Allow time for rate control meds to wash out --May require pacemaker  --Cardiology following, see their recommendations  Essential hypertension IV hydralazine PRN for now. Holding Cardizem and Lopressor  Hypokalemia K 3.1 this AM, normal on admission --Replace with 40 mEq x 2 PO K-Cl  Confusion Baseline mental status is unclear, suspect at least some mild cognitive impairment.  Might be related to sinus pauses and her syncopal episode.  Head CT was non-acute. --Delirium precautions --Avoid sedating meds as possible --Given low dose Xanax x 1 today for persistent agitation, restlessness  --Mgmt of underlying issues as outlined --Inquire baseline mentation from family as able  GERD without esophagitis Continue PPI   Persistent atrial fibrillation Hold Eliquis for possible PM placement. Hold Cardizem, Lopressor Telemetry monitoring Cardiology following        Subjective: Pt sitting up in bed, sitter at bedside.  Her husband is apparently not feeling well today and is at home.  Pt remains pleasantly confused.  States she feels well.  No acute complaints or acute events reported.   Physical Exam: Vitals:   04/10/23 0940 04/10/23 1144 04/10/23 1500 04/10/23 1600  BP:  (!) 141/93 (!) 142/87 137/85  Pulse:  (!) 105 96   Resp: Temp:  97.6 F (  36.4 C)    TempSrc:      SpO2:  94% 98%    General exam: awake, alert, no acute distress, confused, pleasant HEENT: wearing glasses, moist mucus membranes, hearing grossly normal  Respiratory system: CTAB, no wheezes, rales or rhonchi, normal respiratory effort. Cardiovascular system: normal S1/S2, irregular rhythm, regular rate Gastrointestinal system: soft, NT, ND Central nervous system: A&O  x self. no gross focal neurologic deficits, normal speech Extremities: moves all , no edema, normal tone Skin: dry, intact, normal temperature Psychiatry: normal mood, congruent affect, abnormal judgement and insight    Data Reviewed:  Notable labs --- glucose 165, Na 134, otherwise normal BMP   Echocardiogram -- EF 55-60%, indeterminate diastolic parameters, moderately elevated PASP, severely dilated right and left atria, mild-moderate MR, mild-moderate TR  Family Communication: husband at bedside 4/19, 4/20. None present today.  Daughter Boyd Kerbs -- [phone]  - updated   Disposition: Status is: Inpatient Remains inpatient appropriate because: Ongoing evaluation of sinus pauses, syncope   Planned Discharge Destination: Home    Time spent: 35 minutes  Author: Pennie Banter, DO 04/10/2023 4:24 PM  For on call review [email or web address].

## 2023-04-10 NOTE — Progress Notes (Signed)
Patient confused throughout the night, alert to self only, continues to try to get out of bed, patient unaware of surroundings, patient becoming increasingly irritable throughout the night.  Patient has been awake all night.

## 2023-04-11 DIAGNOSIS — I455 Other specified heart block: Secondary | ICD-10-CM | POA: Diagnosis not present

## 2023-04-11 DIAGNOSIS — R55 Syncope and collapse: Secondary | ICD-10-CM | POA: Diagnosis not present

## 2023-04-11 DIAGNOSIS — K047 Periapical abscess without sinus: Secondary | ICD-10-CM | POA: Diagnosis present

## 2023-04-11 LAB — CBC
HCT: 39.2 % (ref 36.0–46.0)
Hemoglobin: 12.6 g/dL (ref 12.0–15.0)
MCH: 30.7 pg (ref 26.0–34.0)
MCHC: 32.1 g/dL (ref 30.0–36.0)
MCV: 95.6 fL (ref 80.0–100.0)
Platelets: 290 10*3/uL (ref 150–400)
RBC: 4.1 MIL/uL (ref 3.87–5.11)
RDW: 13.3 % (ref 11.5–15.5)
WBC: 10.7 10*3/uL — ABNORMAL HIGH (ref 4.0–10.5)
nRBC: 0 % (ref 0.0–0.2)

## 2023-04-11 LAB — BASIC METABOLIC PANEL
Anion gap: 5 (ref 5–15)
BUN: 10 mg/dL (ref 8–23)
CO2: 25 mmol/L (ref 22–32)
Calcium: 8.8 mg/dL — ABNORMAL LOW (ref 8.9–10.3)
Chloride: 103 mmol/L (ref 98–111)
Creatinine, Ser: 0.63 mg/dL (ref 0.44–1.00)
GFR, Estimated: >60 mL/min (ref >60)
Glucose, Bld: 119 mg/dL — ABNORMAL HIGH (ref 70–99)
Potassium: 4.5 mmol/L (ref 3.5–5.1)
Sodium: 133 mmol/L — ABNORMAL LOW (ref 135–145)

## 2023-04-11 LAB — HEPARIN LEVEL (UNFRACTIONATED): Heparin Unfractionated: >1.1 IU/mL — ABNORMAL HIGH (ref 0.30–0.70)

## 2023-04-11 LAB — APTT
aPTT: 80 seconds — ABNORMAL HIGH (ref 24–36)
aPTT: 67 seconds — ABNORMAL HIGH (ref 24–36)

## 2023-04-11 MED ORDER — METOPROLOL TARTRATE 5 MG/5ML IV SOLN: 5.0000 mg | Freq: Four times a day (QID) | INTRAVENOUS | Status: DC | PRN

## 2023-04-11 NOTE — Progress Notes (Signed)
Progress Note  Patient Name: [name] [name] Date of Encounter: 04/11/2023  Primary Cardiologist: Kirke Corin  Subjective   R-R interval improved with washout of diltiazem and metoprolol. However, with this, she was having frequent tachycardic episodes, up into the 180s bpm leading to the restarting of low-dose metoprolol. EP planning for pacemaker implantation on 04/13/2023.  No chest pains or symptoms of cardiac decompensation.  Inpatient Medications    Scheduled Meds:  acidophilus  1 capsule Oral Daily   amLODipine  5 mg Oral Daily   amoxicillin  500 mg Oral TID   cholecalciferol  1,000 Units Oral Daily   metoprolol tartrate  12.5 mg Oral BID   pantoprazole  40 mg Oral Daily   pravastatin  20 mg Oral Daily   Continuous Infusions:  heparin 950 Units/hr (04/10/23 1816)   PRN Meds: acetaminophen **OR** acetaminophen, alum & mag hydroxide-simeth, hydrALAZINE, magnesium hydroxide, metoprolol tartrate, ondansetron **OR** ondansetron (ZOFRAN) IV, polyvinyl alcohol, sodium chloride, traZODone   Vital Signs    Vitals:   04/11/23 0200 04/11/23 0400 04/11/23 0742 04/11/23 0800  BP:  122/70 (!) 144/67 137/85  Pulse:      Resp: 18 (!) 26 15   Temp:      TempSrc:      SpO2:      Height:        Intake/Output Summary (Last 24 hours) at 04/11/2023 0801 Last data filed at 04/10/2023 2300 Gross per 24 hour  Intake 420.44 ml  Output --  Net 420.44 ml    There were no vitals filed for this visit.  Telemetry    Afib with ventriclar rates overall in the low 100s bpm, no further significant prolonged R-R intervals, brief SR noted - Personally Reviewed  ECG    No new tracings - Personally Reviewed  Physical Exam   GEN: No acute distress.   Neck: No JVD. Cardiac: IRIR, no murmurs, rubs, or gallops.  Respiratory: Clear to auscultation bilaterally.  GI: Soft, nontender, non-distended.   MS: No edema; No deformity. Neuro:  Alert and oriented x 3; Nonfocal.  Psych: Normal  affect.  Labs    Chemistry Recent Labs  Lab 04/09/23 0458 04/10/23 0936 04/11/23 0456  NA 138 134* 133*  K 3.1* 4.0 4.5  CL 105 102 103  CO2 GLUCOSE 110* 165* 119*  BUN CREATININE 0.60 0.69 0.63  CALCIUM 8.6* 9.1 8.8*  GFRNONAA >60 >60 >60  ANIONGAP Hematology Recent Labs  Lab 04/07/23 2014 04/08/23 0516 04/11/23 0456  WBC 8.7 10.2 10.7*  RBC 3.90 3.91 4.10  HGB 12.3 12.2 12.6  HCT 37.7 38.0 39.2  MCV 96.7 97.2 95.6  MCH 31.5 31.2 30.7  MCHC 32.6 32.1 32.1  RDW 13.2 13.2 13.3  PLT 277 282 290     Cardiac EnzymesNo results for input(s): "TROPONINI" in the last 168 hours. No results for input(s): "TROPIPOC" in the last 168 hours.   BNPNo results for input(s): "BNP", "PROBNP" in the last 168 hours.   DDimer No results for input(s): "DDIMER" in the last 168 hours.   Radiology    US Carotid Bilateral  Result Date: 04/08/2023 IMPRESSION: 1. Heterogeneous mixed plaques at the left carotid bulb extending into the cervical ICA with estimated 60-70% proximal left ICA stenosis by Doppler criteria. 2. Mild hypoechoic nonstenosing soft plaque at the right carotid bifurcation, extending into the cervical ICA origin but causing less than 50%  stenosis. 3. Vertebral arteries are patent with antegrade flow bilaterally. 4. Degree of carotid stenosis reported correlate with diameter stenosis reduction as determined by NASCET criteria. Consider MRA or CTA to confirm. Electronically Signed   By: Almira Bar M.D.   On: 04/08/2023 03:36   CT HEAD WO CONTRAST ( )  Result Date: 04/08/2023 IMPRESSION: Normal head CT. Electronically Signed   By: Charline Bills M.D.   On: 04/08/2023 02:02    Cardiac Studies   2D echo 04/08/2023: 1. Left ventricular ejection fraction, by estimation, is 55 to 60%. The  left ventricle has normal function. The left ventricle has no regional  wall motion abnormalities. Left ventricular diastolic parameters are   indeterminate.   2. Right ventricular systolic function is normal. The right ventricular  size is mildly enlarged. There is moderately elevated pulmonary artery  systolic pressure. The estimated right ventricular systolic pressure is  51.8 mmHg.   3. Left atrial size was severely dilated.   4. Right atrial size was severely dilated.   5. The mitral valve is normal in structure. Mild to moderate mitral valve  regurgitation. No evidence of mitral stenosis.   6. Tricuspid valve regurgitation is mild to moderate.   7. The aortic valve is normal in structure. Aortic valve regurgitation is  not visualized. No aortic stenosis is present.   8. The inferior vena cava is normal in size with greater than 50%  respiratory variability, suggesting right atrial pressure of 3 mmHg.   Patient Profile     87 y.o. female with history of nonobstructive CAD by LHC in 2012 and 07/2019, permanent A-fib status post multiple cardioversions in the past, atrial flutter, tachycardia-induced cardiomyopathy, HTN, and HLD who we are seeing for pauses.   Assessment & Plan    1. Persistent Afib with pauses and tachybrady syndrome: -Overall, pauses have improved with washout of metoprolol and diltiazem -However, off AV nodal blocking medications, she was having frequent episodes of RVR, up into the 180s bpm on 4/21 leading to the restarting of low-dose Lopressor 12.5 mg bid -Heparin gtt in place of Eliquis -EP planning for pacemaker implantation on 4/24 -Will stop heparin gtt at 2300 on 4/23 at the request of EP, SCDs to start at that time -No current indication for venous temp wire or emergent PPM -Pacer pads and atropine at bedside -Monitor on tele -Magnesium and TSH normal -Replete potassium to goal 4.0 -Ongoing management per EP  2. Nonobstructive CAD: -No symptoms of angina -Ruled out -PTA Eliquis in place of ASA, does not meet reduced dosing criteria of Eliquis -PTA statin -No plans for inpatient  ischemic testing  3. Hypokalemia: -Repleted    For questions or updates, please contact [facility] Please consult [email or web address] for contact info under Cardiology/STEMI.    Signed, Eula Listen, PA-C [facility] Pager: [phone] 04/11/2023, 8:01 AM

## 2023-04-11 NOTE — Progress Notes (Addendum)
Patient seen and examined.   Tele: ST/afib with rates around 100bpm. No further significant pauses.  Yesterday around 0900, appeared to go into Afib w rvr (unable to appreciate P-waves), with rates up to 180.   She was scheduled to have tooth removed by Dr. Kassie Mends (dentist). I spoke to dental office, who has eval'd area. No abscess noted, abx for pain. She is afebrile with stable WBC - cont to monitor   Planning for pacemaker implant 4/24 with Dr. Graciela Husbands Discussed risks and benefits of PPM with patient and spouse at bedside, they both verbalized understanding and wished to proceed.  Lopressor 12.5mg  BID + PRN  IV for HR above 140 Off eliquis for procedure Hep gtt to stop 2300 4/23    Full EP consult to follow tomorrow    Sherie Don, NP  04/11/23 9:00 AM

## 2023-04-11 NOTE — Progress Notes (Signed)
ANTICOAGULATION CONSULT NOTE  Pharmacy Consult for Heparin Infusion Indication: atrial fibrillation  Patient Measurements: Height:  (157.5 cm) IBW/kg (Calculated) : 50.1 Heparin Dosing Weight: 71.7 kg (measured 02/2023)  Labs: Recent Labs    04/09/23 0458 04/10/23 0936 04/10/23 1748 04/11/23 0456 04/11/23 1314  HGB  --   --   --  12.6  --   HCT  --   --   --  39.2  --   PLT  --   --   --  290  --   APTT  --   --  115* 80* 67*  HEPARINUNFRC  --   --   --  >1.10*  --   CREATININE 0.60 0.69  --  0.63  --     CrCl cannot be calculated (Unknown ideal weight.).  Medical History: Past Medical History:  Diagnosis Date   Actinic keratosis    Arthritis    Atrial flutter 02/2011   s/p cardioversion    Chest pain    a. H/o cardiac cath x 2-> 2012 -->nl cors;  b. 12/2016 MV: EF 61%, small region of mild perfusion defect in the apical anteroseptal region c/w breast attenuation, no ischemia-->Low risk; c. 06/2019 MV: Mod size, mild inflat ischemia, EF 59%. Cor and Ao Ca2+. Inflat defect more pronounced on this study compared to last; c. 07/2019 Cath: LM nl, LAD min irregs, LCX nl, OM1/2/3 nl, RCA min irregs.   CKD (chronic kidney disease), stage III    Concussion with no loss of consciousness 09/01/2016   Cystocele    Degenerative disorder of bone    GERD (gastroesophageal reflux disease)    Headache(784.0)    chronic   Hiatal hernia    Hyperlipidemia    Hypertension    Influenza 01/10/2017   Knee fracture    Migraines    Mobitz type 2 second degree atrioventricular block    a. felt to be 2/2 amiodarone, resolved with decreased amiodarone dose.  Amio since d/c'd.   Permanent atrial fibrillation    a. status post multiple DCCVs; b. 2018 - eval for PVI but opted for rate control.   PONV (postoperative nausea and vomiting)    oxycodone and codiene cause N/V    Pre-syncope    a. In setting of dehydration and AKI in the past.   Sleep apnea    Squamous cell carcinoma of skin  11/10/2020   left distal posterior deltoid (EDC 01/15/2021)   Tachycardia induced cardiomyopathy    a. Resolved;  b. 08/2017 Echo: EF 50-55%, no rwma, mild MR, mildly to mod dil LA/RA; c. 02/2018 Echo: EF 55-60%, mild MR. Mildly dil LA. Nl RVSP. PASP .   Venous insufficiency    Vertigo     Assessment: 87 y.o.  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 She takes apixaban with last dose 04/20 2039   Goal of Therapy:  Heparin level 0.3-0.7 units/ml aPTT 66 - 102  seconds Monitor platelets by anticoagulation protocol: Yes   0421 1748 aPTT 115 sec, supratherapeutic 0422 0456 aPTT 80 sec, therapeutic x 1, HL not correlating 0422 1314 aPTT 67 sec, therapeutic x 2  Plan: Continue heparin infusion rate at 950 units/hr Recheck aPTT and heparin level tomorrow AM Adjust based on aPTT until correlation with HL Daily CBC while on heparin  Trinna Post  B Bennie Chirico 04/11/2023 2:01 PM

## 2023-04-11 NOTE — TOC CM/SW Note (Signed)
  Transition of Care [facility]) Screening Note   Patient Details  Name: [name] [name] Date of Birth: 07/11/1936   Transition of Care [facility]) CM/SW Contact:    Margarito Liner, LCSW Phone Number: 04/11/2023, 9:58 AM    Transition of Care Department [facility]) has reviewed patient and no TOC needs have been identified at this time. We will continue to monitor patient advancement through interdisciplinary progression rounds. If new patient transition needs arise, please place a TOC consult.

## 2023-04-11 NOTE — Care Management Important Message (Signed)
Important Message  Patient Details  Name: [name] [name] MRN: [id number] Date of Birth: 21-Feb-1936   Medicare Important Message Given:  Yes     [name] 04/11/2023, 11:53 AM

## 2023-04-11 NOTE — Assessment & Plan Note (Signed)
Continue amoxicillin started prior to admission. Follow up with dentist as planned for tooth extraction.   No evidence on exam for abscess. CT maxillofacial if worsening pain, swelling or fevers develop

## 2023-04-11 NOTE — Progress Notes (Signed)
Progress Note   [name]:[id number] DOB: 03-06-1936 DOA: 04/07/2023     3 DOS: the patient was seen and examined on 04/11/2023   Brief hospital course: HPI on admission 04/07/23 PM by Dr. Arville Care: "ZALEA PETE is a 87 y.o. 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  She has been having Alterman status and generalized weakness.  After syncope she had subsequent nausea and vomiting without bilious vomitus or hematemesis.  No paresthesias or focal muscle weakness.  She admitted to mild dyspnea.  No cough or wheezing.  She denies any chest pain or palpitations.  No fever or chills.  No dysuria, oliguria or hematuria or flank pain.  She stated that she has not been feeling good since yesterday.  The patient was noted to have sinus pauses by EMS as well as in the ER.  She was given atropine by EMS.  She denies any headache, dizziness or blurry vision or diplopia.   ED Course: Upon presentation to the emergency room, heart rate was 102 and respiratory rate 22 with otherwise normal vital signs.  Later on heart rate was 95.  Labs revealed hyponatremia 131 and blood glucose of 186 with otherwise normal BMP.  High sensitive troponin I was 13 and later 17 and CBC was within normal.  UA came back negative EKG as reviewed by me : EKG "  Patient was admitted to cardiac telemetry with Cardiology consulted.  Cardizem and Lopressor were held.  Pacer pads were placed in case of need for transcutaneous pacing.    Assessment and Plan: * Syncope and collapse Due to sinus pauses.   --Mgmt of pauses as outlined --Fall precautions --Orthostatic vitals  Sinus  pause Sinus pauses up to 6 sec noted by EMS.  In setting of rate control agents for Permanent A-fib --Hold Cardizem and Lopressor --Monitor on telemetry --Atropine PRN for symptomatic pauses/bradycardia --Pacer pads in place --Allow time for rate control meds to wash out --May require pacemaker  --Cardiology following, see their recommendations  Essential hypertension IV hydralazine PRN for now. Holding Cardizem and Lopressor  Dental infection Continue amoxicillin started prior to admission. Follow up with dentist as planned for tooth extraction.   No evidence on exam for abscess. CT maxillofacial if worsening pain, swelling or fevers develop  Hypokalemia K 3.1 this AM, normal on admission --Replace with 40 mEq x 2 PO K-Cl  Confusion Baseline mental status is unclear, suspect at least some mild cognitive impairment.  Might be related to sinus pauses and her syncopal episode.  Head CT was non-acute. --Delirium precautions --Avoid sedating meds as possible --Given low dose Xanax x 1 today for persistent agitation, restlessness  --Mgmt of underlying issues as outlined --Inquire baseline mentation from family as able  GERD without esophagitis Continue PPI   Persistent atrial fibrillation Hold Eliquis for possible PM placement. Hold Cardizem, Lopressor Telemetry monitoring Cardiology following        Subjective: Pt sitting up in bed, sitter and husband at bedside.   Pt remains pleasantly confused.  She ate more for breakfast than she's been eating.  She does report tooth pain in the upper left.  No fever/chills.  Reports otherwise she feels well.  Physical Exam: Vitals:   04/11/23 0914 04/11/23 0916 04/11/23 0918 04/11/23 1220  BP: 127/67 136/76 137/86 128/69  Pulse: (!) 109 (!) 109 (!) 107   Resp:    (!) 24  Temp:      TempSrc:      SpO2: 93% 93% 95%   Height:       General exam: awake, alert, no acute distress, confused, pleasant HEENT: left upper molars and  gums without swelling, erythema or any visible signs of infection, no facial tenderness on left or palpable swelling, no submandibular or cervical lymphadenopathy Respiratory system: CTAB, no wheezes, rales or rhonchi, normal respiratory effort. Cardiovascular system: normal S1/S2, irregular rhythm, regular rate Gastrointestinal system: soft, NT, ND Central nervous system: A&O x self. no gross focal neurologic deficits, normal speech Extremities: moves all , no edema, normal tone Skin: dry, intact, normal temperature Psychiatry: normal mood, congruent affect, abnormal judgement and insight    Data Reviewed:  Notable labs --- glucose 119, Na 133, Ca 8.8, otherwise normal BMP  CBC normal except WBC 10.7 from 10.2  Echocardiogram -- EF 55-60%, indeterminate diastolic parameters, moderately elevated PASP, severely dilated right and left atria, mild-moderate MR, mild-moderate TR   Family Communication: husband at bedside 4/19, 4/20, 4/22.  Daughter Boyd Kerbs -- [phone]  - updated 4/21 evening   Disposition: Status is: Inpatient Remains inpatient appropriate because: Ongoing evaluation of sinus pauses, syncope.  Pacemaker placement planned for Wed 4/24   Planned Discharge Destination: Home    Time spent: 35 minutes  Author: Pennie Banter, DO 04/11/2023 1:50 PM  For on call review [email or web address].

## 2023-04-11 NOTE — Progress Notes (Signed)
ANTICOAGULATION CONSULT NOTE  Pharmacy Consult for heparin infusion Indication: atrial fibrillation  Allergies  Allergen Reactions   Cyclobenzaprine Other (See Comments)    Dropped blood pressure and heart rate, "talking out of my mind"   Iodine Anaphylaxis    NO PROBLEMS WITH BETADINE   Amiodarone     Cannot remember    Biaxin [Clarithromycin] Nausea And Vomiting   Codeine Nausea And Vomiting   Digoxin And Related Other (See Comments)    Fatigue, eye puffiness, hoarsness   Enalapril Other (See Comments)    unknown   Famotidine     Caused dizziness   Fluocinonide Other (See Comments)    Tingling sensation in head and redness to scalp.   Iodinated Contrast Media Other (See Comments)    Tachycardia   Iodine-131    Losartan Potassium Other (See Comments)    Dizzy and headache   Omnicef [Cefdinir]    Oxycodone Nausea And Vomiting   Promethazine Other (See Comments)    Unknown    Sertraline     Dizziness and weakness   Tizanidine Hcl Other (See Comments)    hypotension    Warfarin And Related     Bleeding    Xarelto [Rivaroxaban] Other (See Comments)    bleeding    Patient Measurements: Height:  (157.5 cm) IBW/kg (Calculated) : 50.1 Heparin Dosing Weight: 71.7 kg (measured 02/2023)  Vital Signs: Temp: 97.8 F (36.6 C) (04/21 2245) Temp Source: Oral (04/21 2245) BP: 122/70 (04/22 0400) Pulse Rate: 100 (04/21 2245)  Labs: Recent Labs    04/09/23 0458 04/10/23 0936 04/10/23 1748 04/11/23 0456  HGB  --   --   --  12.6  HCT  --   --   --  39.2  PLT  --   --   --  290  APTT  --   --  115* 80*  HEPARINUNFRC  --   --   --  >1.10*  CREATININE 0.60 0.69  --  0.63     CrCl cannot be calculated (Unknown ideal weight.).   Medical History: Past Medical History:  Diagnosis Date   Actinic keratosis    Arthritis    Atrial flutter 02/2011   s/p cardioversion    Chest pain    a. H/o cardiac cath x 2-> 2012 -->nl cors;  b. 12/2016 MV: EF 61%, small  region of mild perfusion defect in the apical anteroseptal region c/w breast attenuation, no ischemia-->Low risk; c. 06/2019 MV: Mod size, mild inflat ischemia, EF 59%. Cor and Ao Ca2+. Inflat defect more pronounced on this study compared to last; c. 07/2019 Cath: LM nl, LAD min irregs, LCX nl, OM1/2/3 nl, RCA min irregs.   CKD (chronic kidney disease), stage III    Concussion with no loss of consciousness 09/01/2016   Cystocele    Degenerative disorder of bone    GERD (gastroesophageal reflux disease)    Headache(784.0)    chronic   Hiatal hernia    Hyperlipidemia    Hypertension    Influenza 01/10/2017   Knee fracture    Migraines    Mobitz type 2 second degree atrioventricular block    a. felt to be 2/2 amiodarone, resolved with decreased amiodarone dose.  Amio since d/c'd.   Permanent atrial fibrillation    a. status post multiple DCCVs; b. 2018 - eval for PVI but opted for rate control.   PONV (postoperative nausea and vomiting)    oxycodone and codiene cause N/V  Pre-syncope    a. In setting of dehydration and AKI in the past.   Sleep apnea    Squamous cell carcinoma of skin 11/10/2020   left distal posterior deltoid (EDC 01/15/2021)   Tachycardia induced cardiomyopathy    a. Resolved;  b. 08/2017 Echo: EF 50-55%, no rwma, mild MR, mildly to mod dil LA/RA; c. 02/2018 Echo: EF 55-60%, mild MR. Mildly dil LA. Nl RVSP. PASP .   Venous insufficiency    Vertigo     Medications:  Scheduled:   acidophilus  1 capsule Oral Daily   amLODipine  5 mg Oral Daily   amoxicillin  500 mg Oral TID   cholecalciferol  1,000 Units Oral Daily   metoprolol tartrate  12.5 mg Oral BID   pantoprazole  40 mg Oral Daily   pravastatin  20 mg Oral Daily    Assessment: 87 y.o.  female w/ PMH of GERD, stage III CKD, HTN, HLD, atrial fibrillation,, history of Mobitz type II second-degree AV block fall due to amiodarone and osteoarthritis, who presented to the emergency room with acute onset of  syncope. She takes apixaban with last dose 04/20 2039   Goal of Therapy:  Heparin level 0.3-0.7 units/ml aPTT 66 - 102  seconds Monitor platelets by anticoagulation protocol: Yes   0421 1748 aPTT 115 sec, supratherapeutic 0422 0456 aPTT 80 sec, therapeutic x 1, HL not correlating  Plan: Continue heparin infusion rate at 950 units/hr Recheck aPTT 8 hours to confirm Adjust based on aPTT until correlation with HL Daily CBC while on heparin  Otelia Sergeant, PharmD, [facility] 04/11/2023 6:37 AM

## 2023-04-11 NOTE — H&P (View-Only) (Signed)
Patient seen and examined.   Tele: ST/afib with rates around 100bpm. No further significant pauses.  Yesterday around 0900, appeared to go into Afib w rvr (unable to appreciate P-waves), with rates up to 180.   She was scheduled to have tooth removed by Dr. Todd Greeson (dentist). I spoke to dental office, who has eval'd area. No abscess noted, abx for pain. She is afebrile with stable WBC - cont to monitor   Planning for pacemaker implant 4/24 with Dr. Klein Discussed risks and benefits of PPM with patient and spouse at bedside, they both verbalized understanding and wished to proceed.  Lopressor 12.5mg BID + PRN 5mg IV for HR above 140 Off eliquis for procedure Hep gtt to stop 2300 4/23    Full EP consult to follow tomorrow    Labria Wos, NP  04/11/23 9:00 AM    

## 2023-04-12 DIAGNOSIS — I455 Other specified heart block: Secondary | ICD-10-CM | POA: Diagnosis not present

## 2023-04-12 DIAGNOSIS — I4821 Permanent atrial fibrillation: Secondary | ICD-10-CM | POA: Diagnosis not present

## 2023-04-12 DIAGNOSIS — R55 Syncope and collapse: Secondary | ICD-10-CM | POA: Diagnosis not present

## 2023-04-12 LAB — HEPARIN LEVEL (UNFRACTIONATED): Heparin Unfractionated: 0.63 IU/mL (ref 0.30–0.70)

## 2023-04-12 LAB — CBC
HCT: 39.7 % (ref 36.0–46.0)
Hemoglobin: 12.9 g/dL (ref 12.0–15.0)
MCH: 31 pg (ref 26.0–34.0)
MCHC: 32.5 g/dL (ref 30.0–36.0)
MCV: 95.4 fL (ref 80.0–100.0)
Platelets: 285 10*3/uL (ref 150–400)
RBC: 4.16 MIL/uL (ref 3.87–5.11)
RDW: 13.2 % (ref 11.5–15.5)
WBC: 8.9 10*3/uL (ref 4.0–10.5)
nRBC: 0 % (ref 0.0–0.2)

## 2023-04-12 LAB — APTT: aPTT: 98 seconds — ABNORMAL HIGH (ref 24–36)

## 2023-04-12 MED ORDER — CHLORHEXIDINE GLUCONATE 4 % EX SOLN
60.0000 mL | Freq: Once | CUTANEOUS | Status: AC
  Administered 2023-04-12: 4 via TOPICAL

## 2023-04-12 MED ORDER — VANCOMYCIN HCL IN DEXTROSE 1-5 GM/200ML-% IV SOLN
1000.0000 mg | INTRAVENOUS | Status: AC
  Administered 2023-04-13: 1000 mg via INTRAVENOUS

## 2023-04-12 MED ORDER — SODIUM CHLORIDE 0.9 % IV SOLN: INTRAVENOUS | Status: DC

## 2023-04-12 MED ORDER — SODIUM CHLORIDE 0.9 % IV SOLN
80.0000 mg | INTRAVENOUS | Status: AC
  Administered 2023-04-13: 80 mg
  Filled 2023-04-12: qty 2

## 2023-04-12 MED ORDER — HYDROXYZINE HCL 25 MG PO TABS
25.0000 mg | ORAL_TABLET | Freq: Three times a day (TID) | ORAL | Status: DC | PRN
  Administered 2023-04-12 – 2023-04-13 (×2): 25 mg via ORAL
  Filled 2023-04-12 (×2): qty 1

## 2023-04-12 MED ORDER — CHLORHEXIDINE GLUCONATE 4 % EX SOLN
60.0000 mL | Freq: Once | CUTANEOUS | Status: AC
  Administered 2023-04-13: 4 via TOPICAL

## 2023-04-12 NOTE — Progress Notes (Signed)
ANTICOAGULATION CONSULT NOTE  Pharmacy Consult for Heparin Infusion Indication: atrial fibrillation  Patient Measurements: Height:  (157.5 cm) IBW/kg (Calculated) : 50.1 Heparin Dosing Weight: 71.7 kg (measured 02/2023)  Labs: Recent Labs    04/10/23 0936 04/10/23 1748 04/11/23 0456 04/11/23 1314 04/12/23 0357  HGB  --   --  12.6  --  12.9  HCT  --   --  39.2  --  39.7  PLT  --   --  290  --  285  APTT  --    < > 80* 67* 98*  HEPARINUNFRC  --   --  >1.10*  --  0.63  CREATININE 0.69  --  0.63  --   --    < > = values in this interval not displayed.    CrCl cannot be calculated (Unknown ideal weight.).  Medical History: Past Medical History:  Diagnosis Date   Actinic keratosis    Arthritis    Atrial flutter 02/2011   s/p cardioversion    Chest pain    a. H/o cardiac cath x 2-> 2012 -->nl cors;  b. 12/2016 MV: EF 61%, small region of mild perfusion defect in the apical anteroseptal region c/w breast attenuation, no ischemia-->Low risk; c. 06/2019 MV: Mod size, mild inflat ischemia, EF 59%. Cor and Ao Ca2+. Inflat defect more pronounced on this study compared to last; c. 07/2019 Cath: LM nl, LAD min irregs, LCX nl, OM1/2/3 nl, RCA min irregs.   CKD (chronic kidney disease), stage III    Concussion with no loss of consciousness 09/01/2016   Cystocele    Degenerative disorder of bone    GERD (gastroesophageal reflux disease)    Headache(784.0)    chronic   Hiatal hernia    Hyperlipidemia    Hypertension    Influenza 01/10/2017   Knee fracture    Migraines    Mobitz type 2 second degree atrioventricular block    a. felt to be 2/2 amiodarone, resolved with decreased amiodarone dose.  Amio since d/c'd.   Permanent atrial fibrillation    a. status post multiple DCCVs; b. 2018 - eval for PVI but opted for rate control.   PONV (postoperative nausea and vomiting)    oxycodone and codiene cause N/V    Pre-syncope    a. In setting of dehydration and AKI in the past.    Sleep apnea    Squamous cell carcinoma of skin 11/10/2020   left distal posterior deltoid (EDC 01/15/2021)   Tachycardia induced cardiomyopathy    a. Resolved;  b. 08/2017 Echo: EF 50-55%, no rwma, mild MR, mildly to mod dil LA/RA; c. 02/2018 Echo: EF 55-60%, mild MR. Mildly dil LA. Nl RVSP. PASP .   Venous insufficiency    Vertigo     Assessment: 87 y.o.  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 She takes apixaban with last dose 04/20 2039   Goal of Therapy:  Heparin level 0.3-0.7 units/ml aPTT 66 - 102  seconds Monitor platelets by anticoagulation protocol: Yes   0421 1748 aPTT 115 sec, supratherapeutic 0422 0456 aPTT 80 sec, therapeutic x 1, HL not correlating 0422 1314 aPTT 67 sec, therapeutic x 2 0423 0357 aPTT 98 sec, HL 0.63,  therapeutic X 3  Plan: 04/23 @ 0357:  aPTT = 98,   HL = 0.63 -  aPTT therapeutic X 3, now correlating with HL  - will use HL to guide dosing from here on - will continue pt on current rate and recheck HL on 4/24 with AM labs Daily CBC while on heparin  Aaminah Forrester D 04/12/2023 5:11 AM

## 2023-04-12 NOTE — Progress Notes (Signed)
Progress Note   [name]:[id number] DOB: 1936/02/12 DOA: 04/07/2023     4 DOS: the patient was seen and examined on 04/12/2023   Brief hospital course: HPI on admission 04/07/23 PM by Dr. Arville Care: "[name] is a 87 y.o. 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  She has been having Alterman status and generalized weakness.  After syncope she had subsequent nausea and vomiting without bilious vomitus or hematemesis.  No paresthesias or focal muscle weakness.  She admitted to mild dyspnea.  No cough or wheezing.  She denies any chest pain or palpitations.  No fever or chills.  No dysuria, oliguria or hematuria or flank pain.  She stated that she has not been feeling good since yesterday.  The patient was noted to have sinus pauses by EMS as well as in the ER.  She was given atropine by EMS.  She denies any headache, dizziness or blurry vision or diplopia.   ED Course: Upon presentation to the emergency room, heart rate was 102 and respiratory rate 22 with otherwise normal vital signs.  Later on heart rate was 95.  Labs revealed hyponatremia 131 and blood glucose of 186 with otherwise normal BMP.  High sensitive troponin I was 13 and later 17 and CBC was within normal.  UA came back negative EKG as reviewed by me : EKG "  Patient was admitted to cardiac telemetry with Cardiology consulted.  Cardizem and Lopressor were held.  Pacer pads were placed in case of need for transcutaneous pacing.    Assessment and Plan: * Syncope and collapse Due to sinus pauses.   --Mgmt of pauses as outlined --Fall precautions --Orthostatic vitals  Sinus  pause Sinus pauses up to 6 sec noted by EMS.  In setting of rate control agents for Permanent A-fib --Cardiology & EP following, see their recommendations --Hold Cardizem  --Lopressor resumed at low dose --Monitor on telemetry --Atropine PRN for symptomatic pauses/bradycardia --Pacemaker planned tomorrow with EP   Essential hypertension IV hydralazine PRN for now. Holding Cardizem and Lopressor  Dental infection Continue amoxicillin started prior to admission. Follow up with dentist as planned for tooth extraction.   No evidence on exam for abscess. CT maxillofacial if worsening pain, swelling or fevers develop  Hypokalemia Resolved with replacment --Monitor BMP --Replace K for goal >4  Confusion Baseline mental status is unclear, suspect at least some mild cognitive impairment.  Might be related to sinus pauses and her syncopal episode.  Head CT was non-acute. Family report memory issues intermittently at home --Delirium precautions --Avoid sedating meds as possible --Given low dose Xanax x 1 today for persistent agitation, restlessness  --Mgmt of underlying issues as outlined --Inquire baseline mentation from family as able  GERD without esophagitis Continue PPI   Persistent atrial fibrillation Hold Eliquis for PM placement. On IV heparin Hold Cardizem Lower dose PO Lopressor resumed 12.5 mg BID Telemetry monitoring Cardiology following        Subjective: Pt sitting up in bed, sitter and husband at bedside.   She reports feeling well, no acute complaints.  Remains pleasantly confused.   Physical Exam: Vitals:   04/12/23 0054 04/12/23 0419 04/12/23 0805 04/12/23 1200  BP: 129/78 128/79 109/85 (!) 109/97  Pulse: (!) 104 (!) 101 (!) 106   Resp: Temp: 98.4 F (36.9 C) 98.5 F (36.9 C)    TempSrc:      SpO2: 95% 94%    Height:       General exam: awake, alert, no acute distress, confused, pleasant HEENT: left upper molars and gums without  visible swelling, erythema or any visible signs of infection, no facial tenderness on left or palpable swelling, no submandibular or cervical lymphadenopathy Respiratory system: on room air, normal respiratory effort. Cardiovascular system: irregular rhythm, regular rate, pedal pulses intact Gastrointestinal system: soft, NT, ND Central nervous system: A&O x self. no gross focal neurologic deficits, normal speech Extremities: moves all , no edema, normal tone Skin: dry, intact, normal temperature Psychiatry: normal mood, congruent affect, abnormal judgement and insight    Data Reviewed:  Notable labs ---  CBC normal except WBC 10.7 from 10.2  Echocardiogram -- EF 55-60%, indeterminate diastolic parameters, moderately elevated PASP, severely dilated right and left atria, mild-moderate MR, mild-moderate TR   Family Communication: husband at bedside  Daughter Boyd Kerbs -- [phone]  - updated 4/21 evening   Disposition: Status is: Inpatient Remains inpatient appropriate because: Ongoing evaluation of sinus pauses, syncope.  Pacemaker placement planned for Wed 4/24   Planned Discharge Destination: Home    Time spent: 35 minutes  Author: Pennie Banter, DO 04/12/2023 3:33 PM  For on call review [email or web address].

## 2023-04-12 NOTE — Consult Note (Cosign Needed)
ELECTROPHYSIOLOGY CONSULT NOTE    Patient ID: [name] [name] MRN: [id number], DOB/AGE: 06-30-1936 87 y.o.  Admit date: 04/07/2023 Date of Consult: 04/12/2023  Primary Physician: Sherlene Shams, MD Primary Cardiologist: Lorine Bears, MD  Electrophysiologist: none   Referring Provider: Dr. Denton Lank  Patient Profile: [name] [name] is a 87 y.o. female with a history of perm Afib/flutter, HTN, mobitz type 2 second deg block d/t amiodarone who is being seen today for the evaluation of syncope, sinus pauses at the request of Dr. Denton Lank.  HPI:  [name] [name] is a 87 y.o. female with above PMH who syncopized with brief LOC 4/18 after attending church, hitting head. She had altered mental status and weakness afterwards, along with nausea and vomiting. EMS was called, who found her having prolonged R-R intervals on EKG, gave  atropine. She was transported to [facility] ER. Found to have HR of 102 in ER. Home metop and dilt were held. Unfortunately, while BB and CCB were washing out, she developed Afib w RVR up to 180s. Low dose lopressor 12.5mg  BID was started with HRs maintaining in low 100s. EP has been asked to evaluate for pacemaker.   This morning she is somewhat anxious in bed, requesting for her father to be updated on her care plan, states that she hasn't seen staff all day.  She is unable to provide history of her syncopal event and any symptoms prior to syncope. No family at bedside. Husband called while in the room and stated he was en route to hospital.   Labs Potassium4.5 (04/22 0456)   Creatinine, ser  0.63 (04/22 0456) PLT  285 (04/23 0357) HGB  12.9 (04/23 0357) WBC 8.9 (04/23 0357)  .    Past Medical History:  Diagnosis Date   Actinic keratosis    Arthritis    Atrial flutter 02/2011   s/p cardioversion    Chest pain    a. H/o cardiac cath x 2-> 2012 -->nl cors;  b. 12/2016 MV: EF 61%, small region of mild perfusion defect in the apical anteroseptal region c/w  breast attenuation, no ischemia-->Low risk; c. 06/2019 MV: Mod size, mild inflat ischemia, EF 59%. Cor and Ao Ca2+. Inflat defect more pronounced on this study compared to last; c. 07/2019 Cath: LM nl, LAD min irregs, LCX nl, OM1/2/3 nl, RCA min irregs.   CKD (chronic kidney disease), stage III    Concussion with no loss of consciousness 09/01/2016   Cystocele    Degenerative disorder of bone    GERD (gastroesophageal reflux disease)    Headache(784.0)    chronic   Hiatal hernia    Hyperlipidemia    Hypertension    Influenza 01/10/2017   Knee fracture    Migraines    Mobitz type 2 second degree atrioventricular block    a. felt to be 2/2 amiodarone, resolved with decreased amiodarone dose.  Amio since d/c'd.   Permanent atrial fibrillation    a. status post multiple DCCVs; b. 2018 - eval for PVI but opted for rate control.   PONV (postoperative nausea and vomiting)    oxycodone and codiene cause N/V    Pre-syncope    a. In setting of dehydration and AKI in the past.   Sleep apnea    Squamous cell carcinoma of skin 11/10/2020   left distal posterior deltoid (EDC 01/15/2021)   Tachycardia induced cardiomyopathy    a. Resolved;  b. 08/2017 Echo: EF 50-55%, no rwma, mild MR, mildly to mod dil LA/RA;  c. 02/2018 Echo: EF 55-60%, mild MR. Mildly dil LA. Nl RVSP. PASP .   Venous insufficiency    Vertigo      Surgical History:  Past Surgical History:  Procedure Laterality Date   ABDOMINAL HYSTERECTOMY  1990   APPENDECTOMY     AUGMENTATION MAMMAPLASTY Bilateral 1986   implants   AUGMENTATION MAMMAPLASTY  1990   AUGMENTATION MAMMAPLASTY  2011   CARDIAC CATHETERIZATION     CARDIOVERSION     x 3   CARDIOVERSION     CARDIOVERSION N/A 02/07/2017   Procedure: CARDIOVERSION;  Surgeon: Iran Ouch, MD;  Location: [facility] ORS;  Service: Cardiovascular;  Laterality: N/A;   CARDIOVERSION N/A 07/22/2017   Procedure: Cardioversion;  Surgeon: Antonieta Iba, MD;  Location: [facility] ORS;   Service: Cardiovascular;  Laterality: N/A;   CATARACT EXTRACTION W/PHACO Right 09/21/2016   Procedure: CATARACT EXTRACTION PHACO AND INTRAOCULAR LENS PLACEMENT (IOC);  Surgeon: Galen Manila, MD;  Location: [facility] ORS;  Service: Ophthalmology;  Laterality: Right;  [place] 44.1 AP% 16.5 CDE 7.30 Fluid Pack Lot #[id number]   CATARACT EXTRACTION W/PHACO Left 10/19/2016   Procedure: CATARACT EXTRACTION PHACO AND INTRAOCULAR LENS PLACEMENT (IOC);  Surgeon: Galen Manila, MD;  Location: [facility] ORS;  Service: Ophthalmology;  Laterality: Left;  [place] 53.7 AP% 19.5 CDE 10.45 Fluid pack lot # [id number]   CHOLECYSTECTOMY     COMBINED AUGMENTATION MAMMAPLASTY AND ABDOMINOPLASTY     JOINT REPLACEMENT Left 06/04/2013   left knee   KNEE ARTHROSCOPY Right 08/16/2016   Procedure: ARTHROSCOPY KNEE, tear posterior horn medial meniscus, tear anterior and posterior horns of lateral meniscus, chondromalacia of lateral compartment grade 3 patella and grade 4 medial;  Surgeon: Donato Heinz, MD;  Location: [facility] ORS;  Service: Orthopedics;  Laterality: Right;   LEFT HEART CATH AND CORONARY ANGIOGRAPHY Left 07/27/2019   Procedure: LEFT HEART CATH AND CORONARY ANGIOGRAPHY;  Surgeon: Iran Ouch, MD;  Location: [facility] INVASIVE CV LAB;  Service: Cardiovascular;  Laterality: Left;   MASTECTOMY  1986   nipple sparing mastectomy/Bilateral with silicone  breast implants, s/p saline replacements   Multiple orthopedic procedures     NOSE SURGERY     TEE WITHOUT CARDIOVERSION N/A 09/26/2017   Procedure: TRANSESOPHAGEAL ECHOCARDIOGRAM (TEE);  Surgeon: Pricilla Riffle, MD;  Location: [facility] ENDOSCOPY;  Service: Cardiovascular;  Laterality: N/A;   TOTAL KNEE ARTHROPLASTY Left      Medications Prior to Admission  Medication Sig Dispense Refill Last Dose   acetaminophen (TYLENOL) 500 MG tablet Take 1,000 mg by mouth every 6 (six) hours as needed (pain).    04/07/2023   aluminum-magnesium hydroxide 200-200 MG/5ML suspension Take 20 mLs by  mouth every 6 (six) hours as needed for indigestion.    prn at unk   amoxicillin (AMOXIL) 500 MG capsule Take 500 mg by mouth 3 (three) times daily.   04/07/2023   apixaban (ELIQUIS) 5 MG TABS tablet TAKE ONE TABLET TWICE DAILY 60 tablet 5 04/07/2023   Carboxymethylcellul-Glycerin (LUBRICATING EYE DROPS OP) Place 1 drop into both eyes daily as needed (dry eyes).   Past Week   cholecalciferol (VITAMIN D) 1000 units tablet Take 1,000 Units by mouth daily.   04/07/2023   Coenzyme Q10 (COQ10) 200 MG CAPS Take 200 mg by mouth daily.   04/07/2023   diltiazem (CARDIZEM CD) 180 MG 24 hr capsule TAKE 1 CAPSULE (180 MG) BY MOUTH EVERY EVENING 90 capsule 1 04/06/2023   metoprolol tartrate (LOPRESSOR) 50 MG tablet Take 1 tablet (50  mg total) by mouth 2 (two) times daily. 180 tablet 1 04/07/2023   pantoprazole (PROTONIX) 40 MG tablet TAKE 1 TABLET BY MOUTH DAILY 90 tablet 1 04/07/2023   pravastatin (PRAVACHOL) 20 MG tablet TAKE 1 TABLET BY MOUTH DAILY 90 tablet 1 04/07/2023   Probiotic Product (PROBIOTIC ADVANCED PO) Take 1 tablet by mouth daily.   04/07/2023   sodium chloride (OCEAN) 0.65 % SOLN nasal spray Place 1 spray into both nostrils daily as needed for congestion.   Past Week   amoxicillin-clavulanate (AUGMENTIN) 875-125 MG tablet Take 1 tablet by mouth 2 (two) times daily. (Patient not taking: Reported on 04/07/2023) 14 tablet 0 Not Taking   chlorhexidine (PERIDEX) 0.12 % solution Use as directed in the mouth or throat. (Patient not taking: Reported on 04/07/2023)   Not Taking    Inpatient Medications:   acidophilus  1 capsule Oral Daily   amLODipine  5 mg Oral Daily   amoxicillin  500 mg Oral TID   cholecalciferol  1,000 Units Oral Daily   metoprolol tartrate  12.5 mg Oral BID   pantoprazole  40 mg Oral Daily   pravastatin  20 mg Oral Daily    Allergies:  Allergies  Allergen Reactions   Cyclobenzaprine Other (See Comments)    Dropped blood pressure and heart rate, "talking out of my mind"   Iodine  Anaphylaxis    NO PROBLEMS WITH BETADINE   Amiodarone     Cannot remember    Biaxin [Clarithromycin] Nausea And Vomiting   Codeine Nausea And Vomiting   Digoxin And Related Other (See Comments)    Fatigue, eye puffiness, hoarsness   Enalapril Other (See Comments)    unknown   Famotidine     Caused dizziness   Fluocinonide Other (See Comments)    Tingling sensation in head and redness to scalp.   Iodinated Contrast Media Other (See Comments)    Tachycardia   Iodine-131    Losartan Potassium Other (See Comments)    Dizzy and headache   Omnicef [Cefdinir]    Oxycodone Nausea And Vomiting   Promethazine Other (See Comments)    Unknown    Sertraline     Dizziness and weakness   Tizanidine Hcl Other (See Comments)    hypotension    Warfarin And Related     Bleeding    Xarelto [Rivaroxaban] Other (See Comments)    bleeding    Family History  Problem Relation Age of Onset   Heart disease Mother    Stomach cancer Father    Breast cancer Sister 24   Breast cancer Sister 41   Breast cancer Sister 68   Breast cancer Sister    Breast cancer Maternal Grandmother    Diabetes Brother    Esophageal cancer Brother    Kidney failure Brother    Heart disease Son        found at autopsy   Ovarian cancer Neg Hx      Physical Exam: Vitals:   04/11/23 1220 04/11/23 2047 04/12/23 0054 04/12/23 0419  BP: 128/69 117/66 129/78 128/79  Pulse:  (!) 102 (!) 104 (!) 101  Resp: (!) 24 18 18 18   Temp:  98.3 F (36.8 C) 98.4 F (36.9 C) 98.5 F (36.9 C)  TempSrc:      SpO2:  93% 95% 94%  Height:        GEN- NAD, A&O x 2, pleasant, somewhat anxious HEENT: Normocephalic, atraumatic Lungs- CTAB, Normal effort.  Heart- Regular rate and rhythm,  No M/G/R.  GI- Soft, NT, ND.  Extremities- No clubbing, cyanosis, or edema    Radiology/Studies:   ECHOCARDIOGRAM COMPLETE Result Date: 04/08/2023 IMPRESSIONS   1. Left ventricular ejection fraction, by estimation, is 55 to 60%. The  left ventricle has normal function. The left ventricle has no regional wall motion abnormalities. Left ventricular diastolic parameters are indeterminate.   2. Right ventricular systolic function is normal. The right ventricular size is mildly enlarged. There is moderately elevated pulmonary artery systolic pressure. The estimated right ventricular systolic pressure is 51.8 mmHg.   3. Left atrial size was severely dilated.   4. Right atrial size was severely dilated.   5. The mitral valve is normal in structure. Mild to moderate mitral valve regurgitation. No evidence of mitral stenosis.   6. Tricuspid valve regurgitation is mild to moderate.   7. The aortic valve is normal in structure. Aortic valve regurgitation is not visualized. No aortic stenosis is present.   8. The inferior vena cava is normal in size with greater than 50% respiratory variability, suggesting right atrial pressure of 3 mmHg.    US Carotid Bilateral Result Date: 04/08/2023 IMPRESSION:  1. Heterogeneous mixed plaques at the left carotid bulb extending into the cervical ICA with estimated 60-70% proximal left ICA stenosis by Doppler criteria.  2. Mild hypoechoic nonstenosing soft plaque at the right carotid bifurcation, extending into the cervical ICA origin but causing less than 50% stenosis.  3. Vertebral arteries are patent with antegrade flow bilaterally.  4. Degree of carotid stenosis reported correlate with diameter stenosis reduction as determined by NASCET criteria. Consider MRA or CTA to confirm.    CT HEAD WO CONTRAST ( ) Result Date: 04/08/2023 IMPRESSION: Normal head CT. Electronically Signed      EKG:04/09/23 - 2:1 Aflutter, rate 98; RBBB (personally reviewed)  TELEMETRY: afib/aflutter, rates 100s (personally reviewed)   Assessment/Plan: #) perm Afib / flutter #) symptomatic bradycardia #) tachy-brady Given patient's prolonged R-R interval on BB and CCB, and Afib w RVR off medications, she meets  criteria for PPM implant Discussed procedure with patient this AM, will discuss with spouse when he arrives to hospital later today PPM planned for tomorrow Off eliquis Hep gtt continues today, off at 2300   #) tooth ache Patient has had L tooth pain eval'd by home dentist, Dr. Joesphine Bare On amoxicillin until extraction of tooth or root canal No appreciable abscess noted by dentist WBC stable, afebrile          For questions or updates, please contact [facility] Please consult [email or web address] for contact info under Cardiology/STEMI.  Signed, Sherie Don, NP  04/12/2023 7:31 AM   AFlutter  tachy brady  Syncope     Dementia    Pacing for relief of bradycardia and syncope   reviewed with husband  The benefits and risks were reviewed including but not limited to death,  perforation, infection, lead dislodgement and device malfunction.  The patient understands agrees and is willing to proceed.

## 2023-04-13 ENCOUNTER — Encounter
Admission: EM | Disposition: A | Discharge status: Home / Self Care | Payer: Self-pay | Source: Home / Self Care | Attending: Internal Medicine

## 2023-04-13 ENCOUNTER — Encounter: Payer: Self-pay | Admitting: Internal Medicine

## 2023-04-13 ENCOUNTER — Inpatient Hospital Stay: Payer: Medicare Other

## 2023-04-13 DIAGNOSIS — R55 Syncope and collapse: Secondary | ICD-10-CM | POA: Diagnosis not present

## 2023-04-13 DIAGNOSIS — I495 Sick sinus syndrome: Principal | ICD-10-CM

## 2023-04-13 HISTORY — PX: PACEMAKER IMPLANT: EP1218

## 2023-04-13 LAB — BASIC METABOLIC PANEL
Anion gap: 7 (ref 5–15)
BUN: 12 mg/dL (ref 8–23)
CO2: 24 mmol/L (ref 22–32)
Calcium: 8.8 mg/dL — ABNORMAL LOW (ref 8.9–10.3)
Chloride: 103 mmol/L (ref 98–111)
Creatinine, Ser: 0.63 mg/dL (ref 0.44–1.00)
GFR, Estimated: >60 mL/min (ref >60)
Glucose, Bld: 116 mg/dL — ABNORMAL HIGH (ref 70–99)
Potassium: 3.7 mmol/L (ref 3.5–5.1)
Sodium: 134 mmol/L — ABNORMAL LOW (ref 135–145)

## 2023-04-13 LAB — SURGICAL PCR SCREEN
MRSA, PCR: NEGATIVE
Staphylococcus aureus: NEGATIVE

## 2023-04-13 LAB — HEPARIN LEVEL (UNFRACTIONATED): Heparin Unfractionated: 0.16 IU/mL — ABNORMAL LOW (ref 0.30–0.70)

## 2023-04-13 LAB — MAGNESIUM: Magnesium: 2 mg/dL (ref 1.7–2.4)

## 2023-04-13 SURGERY — PACEMAKER IMPLANT
Anesthesia: Moderate Sedation

## 2023-04-13 MED ORDER — LIDOCAINE HCL 1 % IJ SOLN
INTRAMUSCULAR | Status: AC
  Filled 2023-04-13: qty 40

## 2023-04-13 MED ORDER — LIDOCAINE HCL (PF) 1 % IJ SOLN
INTRAMUSCULAR | Status: DC | PRN
  Administered 2023-04-13: 40 mL

## 2023-04-13 MED ORDER — METOPROLOL TARTRATE 50 MG PO TABS
50.0000 mg | ORAL_TABLET | Freq: Two times a day (BID) | ORAL | Status: DC
  Administered 2023-04-13 – 2023-04-14 (×2): 50 mg via ORAL
  Filled 2023-04-13 (×2): qty 1

## 2023-04-13 MED ORDER — METOPROLOL TARTRATE 25 MG PO TABS: 25.0000 mg | ORAL_TABLET | Freq: Once | ORAL | Status: DC

## 2023-04-13 MED ORDER — LORAZEPAM 2 MG/ML IJ SOLN
0.5000 mg | Freq: Once | INTRAMUSCULAR | Status: AC | PRN
  Administered 2023-04-13: 0.5 mg via INTRAVENOUS
  Filled 2023-04-13: qty 1

## 2023-04-13 MED ORDER — VANCOMYCIN HCL IN DEXTROSE 1-5 GM/200ML-% IV SOLN
INTRAVENOUS | Status: AC
  Filled 2023-04-13: qty 200

## 2023-04-13 MED ORDER — DILTIAZEM HCL ER COATED BEADS 180 MG PO CP24
180.0000 mg | ORAL_CAPSULE | Freq: Every day | ORAL | Status: DC
  Administered 2023-04-13 – 2023-04-14 (×2): 180 mg via ORAL
  Filled 2023-04-13 (×2): qty 1

## 2023-04-13 MED ORDER — METOPROLOL TARTRATE 25 MG PO TABS
25.0000 mg | ORAL_TABLET | Freq: Two times a day (BID) | ORAL | Status: DC
  Administered 2023-04-13: 25 mg via ORAL

## 2023-04-13 MED ORDER — ONDANSETRON HCL 4 MG/2ML IJ SOLN: 4.0000 mg | Freq: Four times a day (QID) | INTRAMUSCULAR | Status: DC | PRN

## 2023-04-13 MED ORDER — ZIPRASIDONE MESYLATE 20 MG IM SOLR
20.0000 mg | Freq: Once | INTRAMUSCULAR | Status: AC
  Administered 2023-04-13: 20 mg via INTRAMUSCULAR
  Filled 2023-04-13: qty 20

## 2023-04-13 MED ORDER — ACETAMINOPHEN 325 MG PO TABS
325.0000 mg | ORAL_TABLET | ORAL | Status: DC | PRN
  Administered 2023-04-13: 650 mg via ORAL
  Filled 2023-04-13: qty 2

## 2023-04-13 MED ORDER — HEPARIN (PORCINE) IN NACL 1000-0.9 UT/500ML-% IV SOLN
INTRAVENOUS | Status: DC | PRN
  Administered 2023-04-13: 500 mL

## 2023-04-13 SURGICAL SUPPLY — 15 items
DEVICE DSSCT PLSMBLD 3.0S LGHT (MISCELLANEOUS) IMPLANT
ELECT REM PT RETURN 9FT ADLT (ELECTROSURGICAL) ×1
ELECTRODE REM PT RTRN 9FT ADLT (ELECTROSURGICAL) IMPLANT
HEMOSTAT SURGICEL 2X4 FIBR (HEMOSTASIS) ×1 IMPLANT
INTRO PACEMKR SHEATH II 7FR (MISCELLANEOUS) ×1
INTRODUCER PACEMKR SHTH II 7FR (MISCELLANEOUS) IMPLANT
LEAD ISOFLEX OPT 1948-58CM (Lead) ×1 IMPLANT
PACEMAKER ASSURITY SR-SF (Pacemaker) ×1 IMPLANT
PAD ELECT DEFIB RADIOL ZOLL (MISCELLANEOUS) ×1 IMPLANT
PLASMABLADE 3.0S W/LIGHT (MISCELLANEOUS) ×1
SLING ARM IMMOBILIZER LRG (SOFTGOODS) ×1 IMPLANT
SUT SILK 2 0 SH CR/8 (SUTURE) ×1 IMPLANT
SUT VIC AB 2-0 CT2 27 (SUTURE) ×1 IMPLANT
SUT VICRYL AB 3-0 FS1 BRD 27IN (SUTURE) ×1 IMPLANT
TRAY PACEMAKER INSERTION (PACKS) ×2 IMPLANT

## 2023-04-13 NOTE — Progress Notes (Signed)
Chest x-ray was reviewed without pneumothorax  Device interrogation demonstrates normal function  My point of view can be discharged

## 2023-04-13 NOTE — Progress Notes (Signed)
Progress Note   [name] ZOX:[id number] DOB: 10-27-1936 DOA: 04/07/2023     5 DOS: the patient was seen and examined on 04/13/2023   Subjective:  Patient seen and examined at bedside this morning Underwent pacemaker placement today Denies nausea vomiting chest pain EP cardiologist on board Later this afternoon patient had some episode of agitation A dose of Geodon ordered as well as Ativan if Geodon unsuccessful. Ideally in an elderly patient we will like to avoid benzos as much as possible    Brief hospital course: HPI on admission 04/07/23 PM by Dr. Arville Care: "[name] is a 87 y.o. 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  She has been having Alterman status and generalized weakness.  After syncope she had subsequent nausea and vomiting without bilious vomitus or hematemesis.  No paresthesias or focal muscle weakness.  She admitted to mild dyspnea.  No cough or wheezing.  She denies any chest pain or palpitations.  No fever or chills.  No dysuria, oliguria or hematuria or flank pain.  She stated that she has not been feeling good since yesterday.  The patient was noted to have sinus pauses by EMS as well as in the ER.  She was given atropine by EMS.  She denies any headache, dizziness or blurry vision or diplopia.   ED Course: Upon presentation to the emergency room, heart rate was 102 and respiratory rate 22 with otherwise normal vital signs.  Later on heart rate was 95.  Labs revealed hyponatremia 131 and blood glucose of 186 with otherwise normal BMP.  High sensitive troponin I was 13 and later 17 and CBC was within normal.  UA came back  negative EKG as reviewed by me : EKG "   Patient was admitted to cardiac telemetry with Cardiology consulted.   Cardizem and Lopressor were held.  Pacer pads were placed in case of need for transcutaneous pacing.      Assessment and Plan:  Syncope and collapse Due to sinus pauses.   --Mgmt of pauses as outlined --Fall precautions --Orthostatic vitals   Sinus pause Sinus pauses up to 6 sec noted by EMS.  In setting of rate control agents for Permanent A-fib --Cardiology & EP following, see their recommendations --Hold Cardizem  --Lopressor resumed at low dose --Monitor on telemetry --Atropine PRN for symptomatic pauses/bradycardia -- Patient underwent pacemaker placement today by EP cardiologist     Essential hypertension IV hydralazine PRN for now. Holding Cardizem and Lopressor   Dental infection Continue amoxicillin started prior to admission. Follow up with dentist as planned for tooth extraction.   No evidence on exam for abscess. CT maxillofacial if worsening pain, swelling or fevers develop   Hypokalemia Resolved with replacment --Monitor BMP --Replace K for goal >4   Confusion Baseline mental status is unclear, suspect at least some mild cognitive impairment.  Might be related to sinus pauses and her syncopal episode.  Head CT was non-acute. Family report memory issues intermittently at home --Delirium precautions --Avoid sedating meds as possible --Given low dose Xanax x 1 today for persistent agitation, restlessness  --Mgmt of underlying issues as outlined --Inquire baseline mentation from family as able   GERD without  esophagitis Continue PPI    Persistent atrial fibrillation Hold Eliquis for PM placement. On IV heparin Hold Cardizem Lower dose PO Lopressor resumed 12.5 mg BID Telemetry monitoring Cardiology following   Data Reviewed:   Notable labs ---  CBC normal except WBC 10.7 from 10.2   Echocardiogram -- EF 55-60%, indeterminate  diastolic parameters, moderately elevated PASP, severely dilated right and left atria, mild-moderate MR, mild-moderate TR     Family Communication: husband at bedside  Daughter Boyd Kerbs -- [phone]  - updated 4/21 evening     Disposition: Status is: Inpatient Remains inpatient appropriate because: Ongoing evaluation of sinus pauses, syncope.    Planned Discharge Destination: Home       Time spent: 40 minutes    Physical Exam: General exam: awake, alert, no acute distress, confused, pleasant HEENT: left upper molars and gums without visible swelling Respiratory system: on room air, normal respiratory effort. Cardiovascular system: irregular rhythm, regular rate Gastrointestinal system: soft, NT, ND Central nervous system: A&O x self. no gross focal neurologic deficits, Extremities: moves all , no edema, normal tone Skin: dry, intact, normal temperature Psychiatry: normal mood, congruent affect    Vitals:   04/13/23 0930 04/13/23 0945 04/13/23 1316 04/13/23 1625  BP: (!) 146/69 (!) 150/80 139/66 (!) 143/56  Pulse: (!) 103 (!) 101 (!) 108 90  Resp: 15 (!) Temp:   98.2 F (36.8 C)   TempSrc:   Oral   SpO2: 95% 94% 96% 92%  Height:        Author: Loyce Dys, MD 04/13/2023 6:00 PM  For on call review [email or web address].

## 2023-04-13 NOTE — Interval H&P Note (Signed)
History and Physical Interval Note:  04/13/2023 7:18 AM  [name] [name]  has presented today for surgery, with the diagnosis of tachy-brady syndrome.  The various methods of treatment have been discussed with the patient and family. After consideration of risks, benefits and other options for treatment, the patient has consented to  Procedure(s): PACEMAKER IMPLANT (N/A) as a surgical intervention.  The patient's history has been reviewed, patient examined, no change in status, stable for surgery.  I have reviewed the patient's chart and labs.  Questions were answered to the patient's satisfaction.     Sherryl Manges  Reviewed with patient this am

## 2023-04-13 NOTE — Plan of Care (Signed)

## 2023-04-13 NOTE — Progress Notes (Signed)
FOR RATE CONTROL WILL RESUME DILT 180 METO TARTRATE 50>>25 BID AND STOP AMLODIPINE   RESUME Apixaban  ON MONDAY 29

## 2023-04-14 DIAGNOSIS — R55 Syncope and collapse: Secondary | ICD-10-CM | POA: Diagnosis not present

## 2023-04-14 MED ORDER — METOPROLOL TARTRATE 25 MG PO TABS: 25.0000 mg | ORAL_TABLET | Freq: Two times a day (BID) | ORAL | 0 refills | Status: DC

## 2023-04-14 NOTE — Care Management Important Message (Signed)
Important Message  Patient Details  Name: [name] [name] MRN: [id number] Date of Birth: 12-16-1936   Medicare Important Message Given:  Yes     [name] 04/14/2023, 1:34 PM

## 2023-04-14 NOTE — Discharge Summary (Signed)
Physician Discharge Summary   [name] MRN: [id number] DOB: Sep 06, 1936  Admit date:     04/07/2023  Discharge date: 04/14/23  Discharge Physician: [name] [name]   PCP: [name] Shams, MD    Discharge Diagnoses: Syncope and collapse likely secondary to tachybradycardia syndrome Tachybradycardia syndrome s/p pacemaker placement Essential hypertension Dental infection Hypokalemia Acute metabolic encephalopathy severe to syncope in the setting of tachybradycardia syndrome Dementia GERD without esophagitis Chronic persistent atrial fibrillation  Hospital course: [name] [name] is a 87 y.o. Caucasian female with medical history significant for GERD, stage III CKD, hypertension, dyslipidemia, history of atrial fibrillation,, history of Mobitz type II second-degree AV block fall due to amiodarone and osteoarthritis, who presented to the emergency room with acute onset of syncope earlier on the morning of presentation with brief loss of consciousness and head injury on the right side.  She has been having altered mental status and generalized weakness.  After syncope she had subsequent nausea and vomiting without bilious vomitus or hematemesis. The patient was noted to have sinus pauses by EMS as well as in the ER.  She was given atropine by EMS. Patient was admitted to cardiac telemetry with Cardiology consulted.  Patient was seen by electrophysiologist and was found to have tachybradycardia syndrome and underwent pacemaker placement.  Patient has been cleared by cardiologist for discharge today with adjustment rhythm medications and to follow-up as an outpatient.   Consultants: Cardiology Procedures performed: Pacemaker implantation Disposition: Home Diet recommendation:  Discharge Diet Orders (From admission, onward)     Start     Ordered   04/14/23 0000  Diet - low sodium heart healthy        04/14/23 1136           Cardiac diet DISCHARGE MEDICATION: Allergies as  of 04/14/2023       Reactions   Cyclobenzaprine Other (See Comments)   Dropped blood pressure and heart rate, "talking out of my mind"   Iodine Anaphylaxis   NO PROBLEMS WITH BETADINE   Amiodarone    Cannot remember   Biaxin [clarithromycin] Nausea And Vomiting   Codeine Nausea And Vomiting   Digoxin And Related Other (See Comments)   Fatigue, eye puffiness, hoarsness   Enalapril Other (See Comments)   unknown   Famotidine    Caused dizziness   Fluocinonide Other (See Comments)   Tingling sensation in head and redness to scalp.   Iodinated Contrast Media Other (See Comments)   Tachycardia   Iodine-131    Losartan Potassium Other (See Comments)   Dizzy and headache   Omnicef [cefdinir]    Oxycodone Nausea And Vomiting   Promethazine Other (See Comments)   Unknown   Sertraline    Dizziness and weakness   Tizanidine Hcl Other (See Comments)   hypotension   Warfarin And Related    Bleeding   Xarelto [rivaroxaban] Other (See Comments)   bleeding        Medication List     STOP taking these medications    amoxicillin-clavulanate 875-125 MG tablet Commonly known as: AUGMENTIN   chlorhexidine 0.12 % solution Commonly known as: PERIDEX       TAKE these medications    acetaminophen 500 MG tablet Commonly known as: TYLENOL Take 1,000 mg by mouth every 6 (six) hours as needed (pain).   aluminum-magnesium hydroxide 200-200 MG/5ML suspension Take 20 mLs by mouth every 6 (six) hours as needed for indigestion.   amoxicillin 500 MG capsule Commonly known  as: AMOXIL Take 500 mg by mouth 3 (three) times daily.   cholecalciferol 1000 units tablet Commonly known as: VITAMIN D Take 1,000 Units by mouth daily.   CoQ10 200 MG Caps Take 200 mg by mouth daily.   diltiazem 180 MG 24 hr capsule Commonly known as: CARDIZEM CD TAKE 1 CAPSULE (180 MG) BY MOUTH EVERY EVENING   Eliquis 5 MG Tabs tablet Generic drug: apixaban TAKE ONE TABLET TWICE DAILY   LUBRICATING  EYE DROPS OP Place 1 drop into both eyes daily as needed (dry eyes).   metoprolol tartrate 25 MG tablet Commonly known as: LOPRESSOR Take 1 tablet (25 mg total) by mouth 2 (two) times daily for 60 doses. What changed:  medication strength how much to take   pantoprazole 40 MG tablet Commonly known as: PROTONIX TAKE 1 TABLET BY MOUTH DAILY   pravastatin 20 MG tablet Commonly known as: PRAVACHOL TAKE 1 TABLET BY MOUTH DAILY   PROBIOTIC ADVANCED PO Take 1 tablet by mouth daily.   sodium chloride 0.65 % Soln nasal spray Commonly known as: OCEAN Place 1 spray into both nostrils daily as needed for congestion.        Discharge Exam:  General exam: awake, alert, no acute distress, confused, pleasant HEENT: left upper molars and gums without visible swelling Respiratory system: on room air, normal respiratory effort. Cardiovascular system: irregular rhythm, regular rate Gastrointestinal system: soft, NT, ND Central nervous system: A&O x self. no gross focal neurologic deficits, Extremities: moves all , no edema, normal tone Skin: dry, intact, normal temperature Psychiatry: normal mood  Condition at discharge: good Discharge time spent: greater than 30 minutes.  Signed: Loyce Dys, MD [facility] 04/14/2023

## 2023-04-14 NOTE — Discharge Instructions (Addendum)
After Your Pacemaker   You have a [employer] Pacemaker  ACTIVITY Do not lift your arm above shoulder height for 1 week after your procedure. After 7 days, you may progress as below.  You should remove your sling 24 hours after your procedure, unless otherwise instructed by your provider.     Thursday Apr 21, 2023  Friday Apr 22, 2023 Saturday Apr 23, 2023 Sunday Apr 24, 2023   Do not lift, push, pull, or carry anything over 10 pounds with the affected arm until 6 weeks (Thursday May 26, 2023 ) after your procedure.   You may drive AFTER your wound check, unless you have been told otherwise by your provider.   Ask your healthcare provider when you can go back to work   INCISION/Dressing Resume Eliquis Monday, 4/29  If large square, outer bandage is left in place, this can be removed after 24 hours from your procedure. Do not remove steri-strips or glue as below.   If a PRESSURE DRESSING (a bulky dressing that usually goes up over your shoulder) was applied or left in place, please follow instructions given by your provider on when to return to have this removed.   Monitor your Pacemaker site for redness, swelling, and drainage. Call the device clinic at [phone] if you experience these symptoms or fever/chills.  If your incision is closed with Dermabond/Surgical glue. You may shower 1 day after your pacemaker implant and wash around the site with soap and water.    If you were discharged in a sling, please do not wear this during the day more than 48 hours after your surgery unless otherwise instructed. This may increase the risk of stiffness and soreness in your shoulder.   Avoid lotions, ointments, or perfumes over your incision until it is well-healed.  You may use a hot tub or a pool AFTER your wound check appointment if the incision is completely closed.  Pacemaker Alerts:  Some alerts are vibratory and others beep. These are NOT emergencies. Please call our office to let us know.  If this occurs at night or on weekends, it can wait until the next business day. Send a remote transmission.  If your device is capable of reading fluid status (for heart failure), you will be offered monthly monitoring to review this with you.   DEVICE MANAGEMENT Remote monitoring is used to monitor your pacemaker from home. This monitoring is scheduled every 91 days by our office. It allows [place] to keep an eye on the functioning of your device to ensure it is working properly. You will routinely see your Electrophysiologist annually (more often if necessary).   You should receive your ID card for your new device in 4-8 weeks. Keep this card with you at all times once received. Consider wearing a medical alert bracelet or necklace.  Your Pacemaker may be MRI compatible. This will be discussed at your next office visit/wound check.  You should avoid contact with strong electric or magnetic fields.   Do not use amateur (ham) radio equipment or electric (arc) welding torches. MP3 player headphones with magnets should not be used. Some devices are safe to use if held at least 12 inches (30 cm) from your Pacemaker. These include power tools, lawn mowers, and speakers. If you are unsure if something is safe to use, ask your health care provider.  When using your cell phone, hold it to the ear that is on the opposite side from the Pacemaker. Do not leave your cell  phone in a pocket over the Pacemaker.  You may safely use electric blankets, heating pads, computers, and microwave ovens.  Call the office right away if: You have chest pain. You feel more short of breath than you have felt before. You feel more light-headed than you have felt before. Your incision starts to open up.  This information is not intended to replace advice given to you by your health care provider. Make sure you discuss any questions you have with your health care provider.

## 2023-04-15 ENCOUNTER — Telehealth: Payer: Self-pay | Admitting: *Deleted

## 2023-04-15 NOTE — Transitions of Care (Post Inpatient/ED Visit) (Signed)
   04/15/2023  Name: [name] [name] MRN: [id number] DOB: 1936-04-23  Today's TOC FU Call Status: Today's TOC FU Call Status:: Successful TOC FU Call Competed TOC FU Call Complete Date: 04/15/23  Transition Care Management Follow-up Telephone Call Date of Discharge: 04/14/23 Discharge Facility: [facility]) Type of Discharge: Inpatient Admission Primary Inpatient Discharge Diagnosis:: Syncope and collapse How have you been since you were released from the hospital?: Better (doing ok. A little tired. I am trying to get up and move around and build my strength back up.) Any questions or concerns?: No  Items Reviewed: Did you receive and understand the discharge instructions provided?: Yes Medications obtained and verified?: Yes (Medications Reviewed) Any new allergies since your discharge?: No Dietary orders reviewed?: No Do you have support at home?: Yes People in Home: spouse Name of Support/Comfort Primary Source: [facility] and Equipment/Supplies: Were Home Health Services Ordered?: No Any new equipment or medical supplies ordered?: No  Functional Questionnaire: Do you need assistance with bathing/showering or dressing?: Yes Do you need assistance with meal preparation?: Yes Do you need assistance with eating?: No Do you have difficulty maintaining continence: No Do you need assistance with getting out of bed/getting out of a chair/moving?: Yes Do you have difficulty managing or taking your medications?: No  Follow up appointments reviewed: PCP Follow-up appointment confirmed?: NA Specialist Hospital Follow-up appointment confirmed?: Yes Date of Specialist follow-up appointment?: 04/25/23 Follow-Up Specialty Provider:: Sherie Don [phone] 11:00, Dr Kirke Corin [phone] 9:40 Do you need transportation to your follow-up appointment?: No Do you understand care options if your condition(s) worsen?: Yes-patient verbalized understanding  SDOH  Interventions Today    Flowsheet Row Most Recent Value  SDOH Interventions   Food Insecurity Interventions Intervention Not Indicated  Housing Interventions Intervention Not Indicated  Transportation Interventions Intervention Not Indicated      Interventions Today    Flowsheet Row Most Recent Value  General Interventions   General Interventions Discussed/Reviewed General Interventions Discussed, General Interventions Reviewed, Doctor Visits  Doctor Visits Discussed/Reviewed Doctor Visits Discussed, Doctor Visits Reviewed      [facility] Interventions Today    Flowsheet Row Most Recent Value  TOC Interventions   TOC Interventions Discussed/Reviewed TOC Interventions Discussed, TOC Interventions Reviewed        Gean Maidens BSN RN [facility] [phone]

## 2023-04-23 DIAGNOSIS — I495 Sick sinus syndrome: Secondary | ICD-10-CM | POA: Insufficient documentation

## 2023-04-23 DIAGNOSIS — Z950 Presence of cardiac pacemaker: Secondary | ICD-10-CM | POA: Insufficient documentation

## 2023-04-23 NOTE — Progress Notes (Unsigned)
Cardiology Office Note Date:  04/23/2023  Patient ID:  [name], [name] Feb 20, 1936, MRN [id number] PCP:  Sherlene Shams, MD  Cardiologist:  Lorine Bears, MD Electrophysiologist: Sherryl Manges, MD  ***refresh    Chief Complaint: ***  History of Present Illness: [name] [name] is a 87 y.o. female with PMH notable for permanent A-fib/flutter, tachybradycardia syndrome status post PPM, hypertension; seen today for Sherryl Manges, MD for routine wound check after pacemaker placement .    Admitted 4/19-25/24 after an episode of syncope at home.  Was found to have sinus pauses up to 6 seconds by EMS personnel.  AV nodal agents were held patient subsequently developed tachycardia and is now status post pacemaker placement by Dr. Graciela Husbands on 04/13/2023.  *** bleeding concers   Since discharge from hospital the patient reports doing ***.  she denies chest pain, palpitations, dyspnea, PND, orthopnea, nausea, vomiting, dizziness, syncope, edema, weight gain, or early satiety.     Device Information: [employer] Jude single-chamber pacemaker, implanted 03/2023; tachybradycardia  AAD History: Amiodarone, stopped due to sinus pauses during most recent hospitalization  Past Medical History:  Diagnosis Date   Actinic keratosis    Arthritis    Atrial flutter (HCC) 02/2011   s/p cardioversion    Chest pain    a. H/o cardiac cath x 2-> 2012 -->nl cors;  b. 12/2016 MV: EF 61%, small region of mild perfusion defect in the apical anteroseptal region c/w breast attenuation, no ischemia-->Low risk; c. 06/2019 MV: Mod size, mild inflat ischemia, EF 59%. Cor and Ao Ca2+. Inflat defect more pronounced on this study compared to last; c. 07/2019 Cath: LM nl, LAD min irregs, LCX nl, OM1/2/3 nl, RCA min irregs.   CKD (chronic kidney disease), stage III (HCC)    Concussion with no loss of consciousness 09/01/2016   Cystocele    Degenerative disorder of bone    GERD (gastroesophageal reflux disease)     Headache(784.0)    chronic   Hiatal hernia    Hyperlipidemia    Hypertension    Influenza 01/10/2017   Knee fracture    Migraines    Mobitz type 2 second degree atrioventricular block    a. felt to be 2/2 amiodarone, resolved with decreased amiodarone dose.  Amio since d/c'd.   Permanent atrial fibrillation (HCC)    a. status post multiple DCCVs; b. 2018 - eval for PVI but opted for rate control.   PONV (postoperative nausea and vomiting)    oxycodone and codiene cause N/V    Pre-syncope    a. In setting of dehydration and AKI in the past.   Sleep apnea    Squamous cell carcinoma of skin 11/10/2020   left distal posterior deltoid (EDC 01/15/2021)   Tachycardia induced cardiomyopathy (HCC)    a. Resolved;  b. 08/2017 Echo: EF 50-55%, no rwma, mild MR, mildly to mod dil LA/RA; c. 02/2018 Echo: EF 55-60%, mild MR. Mildly dil LA. Nl RVSP. PASP .   Venous insufficiency    Vertigo     Past Surgical History:  Procedure Laterality Date   ABDOMINAL HYSTERECTOMY  1990   APPENDECTOMY     AUGMENTATION MAMMAPLASTY Bilateral 1986   implants   AUGMENTATION MAMMAPLASTY  1990   AUGMENTATION MAMMAPLASTY  2011   CARDIAC CATHETERIZATION     CARDIOVERSION     x 3   CARDIOVERSION     CARDIOVERSION N/A 02/07/2017   Procedure: CARDIOVERSION;  Surgeon: Iran Ouch, MD;  Location: [facility] ORS;  Service: Cardiovascular;  Laterality: N/A;   CARDIOVERSION N/A 07/22/2017   Procedure: Cardioversion;  Surgeon: Antonieta Iba, MD;  Location: [facility] ORS;  Service: Cardiovascular;  Laterality: N/A;   CATARACT EXTRACTION W/PHACO Right 09/21/2016   Procedure: CATARACT EXTRACTION PHACO AND INTRAOCULAR LENS PLACEMENT (IOC);  Surgeon: Galen Manila, MD;  Location: [facility] ORS;  Service: Ophthalmology;  Laterality: Right;  [place] 44.1 AP% 16.5 CDE 7.30 Fluid Pack Lot #[id number]   CATARACT EXTRACTION W/PHACO Left 10/19/2016   Procedure: CATARACT EXTRACTION PHACO AND INTRAOCULAR LENS PLACEMENT (IOC);  Surgeon:  Galen Manila, MD;  Location: [facility] ORS;  Service: Ophthalmology;  Laterality: Left;  [place] 53.7 AP% 19.5 CDE 10.45 Fluid pack lot # [id number]   CHOLECYSTECTOMY     COMBINED AUGMENTATION MAMMAPLASTY AND ABDOMINOPLASTY     JOINT REPLACEMENT Left 06/04/2013   left knee   KNEE ARTHROSCOPY Right 08/16/2016   Procedure: ARTHROSCOPY KNEE, tear posterior horn medial meniscus, tear anterior and posterior horns of lateral meniscus, chondromalacia of lateral compartment grade 3 patella and grade 4 medial;  Surgeon: Donato Heinz, MD;  Location: [facility] ORS;  Service: Orthopedics;  Laterality: Right;   LEFT HEART CATH AND CORONARY ANGIOGRAPHY Left 07/27/2019   Procedure: LEFT HEART CATH AND CORONARY ANGIOGRAPHY;  Surgeon: Iran Ouch, MD;  Location: [facility] INVASIVE CV LAB;  Service: Cardiovascular;  Laterality: Left;   MASTECTOMY  1986   nipple sparing mastectomy/Bilateral with silicone  breast implants, s/p saline replacements   Multiple orthopedic procedures     NOSE SURGERY     PACEMAKER IMPLANT N/A 04/13/2023   Procedure: PACEMAKER IMPLANT;  Surgeon: Duke Salvia, MD;  Location: [facility] INVASIVE CV LAB;  Service: Cardiovascular;  Laterality: N/A;   TEE WITHOUT CARDIOVERSION N/A 09/26/2017   Procedure: TRANSESOPHAGEAL ECHOCARDIOGRAM (TEE);  Surgeon: Pricilla Riffle, MD;  Location: [facility] ENDOSCOPY;  Service: Cardiovascular;  Laterality: N/A;   TOTAL KNEE ARTHROPLASTY Left     Current Outpatient Medications  Medication Instructions   acetaminophen (TYLENOL) 1,000 mg, Oral, Every 6 hours PRN   aluminum-magnesium hydroxide 200-200 MG/5ML suspension 20 mLs, Oral, Every 6 hours PRN   amoxicillin (AMOXIL) 500 mg, Oral, 3 times daily   Carboxymethylcellul-Glycerin (LUBRICATING EYE DROPS OP) 1 drop, Both Eyes, Daily PRN   cholecalciferol (VITAMIN D) 1,000 Units, Oral, Daily   CoQ10 200 mg, Oral, Daily   diltiazem (CARDIZEM CD) 180 MG 24 hr capsule TAKE 1 CAPSULE (180 MG) BY MOUTH EVERY EVENING   Eliquis 5 mg,  Oral, 2 times daily   metoprolol tartrate (LOPRESSOR) 25 mg, Oral, 2 times daily   pantoprazole (PROTONIX) 40 MG tablet TAKE 1 TABLET BY MOUTH DAILY   pravastatin (PRAVACHOL) 20 mg, Oral, Daily   Probiotic Product (PROBIOTIC ADVANCED PO) 1 tablet, Oral, Daily   sodium chloride (OCEAN) 0.65 % SOLN nasal spray 1 spray, Each Nare, Daily PRN    Social History:  The patient  reports that she has never smoked. She has never used smokeless tobacco. She reports that she does not drink alcohol and does not use drugs.   Family History:  *** include only if pertinent The patient's family history includes Breast cancer in her maternal grandmother and sister; Breast cancer (age of onset: 76) in her sister; Breast cancer (age of onset: 3) in her sister; Breast cancer (age of onset: 35) in her sister; Diabetes in her brother; Esophageal cancer in her brother; Heart disease in her mother and son; Kidney failure in her brother; Stomach cancer in her father.***  ROS:  Please see the history of present illness. All other systems are reviewed and otherwise negative.   PHYSICAL EXAM: *** VS:  There were no vitals taken for this visit. BMI: There is no height or weight on file to calculate BMI.  GEN- The patient is well appearing, alert and oriented x 3 today.   Lungs- Clear to ausculation bilaterally, normal work of breathing.  Heart- {Blank single:19197::"Regular","Irregularly irregular"} rate and rhythm, no murmurs, rubs or gallops Extremities- {EDEMA LEVEL:28147::"No"} peripheral edema, warm, dry Skin-  *** device pocket well-healed, no tethering     Device interrogation done today and reviewed by myself:  Battery *** Lead thresholds, impedence, sensing stable *** *** episodes *** changes made today  EKG is not ordered. Personal review of EKG from {Blank single:19197::"today","***"} shows:  ***  Recent Labs: 12/29/2022: ALT 20 04/08/2023: TSH 0.971 04/12/2023: Hemoglobin 12.9; Platelets  285 04/13/2023: BUN 12; Creatinine, Ser 0.63; Magnesium 2.0; Potassium 3.7; Sodium 134  12/29/2022: Cholesterol 149; Direct LDL 72.0; HDL 66.10; LDL Cholesterol 70; Total CHOL/HDL Ratio 2; Triglycerides 65.0; VLDL 13.0   CrCl cannot be calculated (Unknown ideal weight.).   Wt Readings from Last 3 Encounters:  03/04/23 162 lb 9.6 oz (73.8 kg)  12/29/22 161 lb 3.2 oz (73.1 kg)  11/04/22 158 lb 8 oz (71.9 kg)     Additional studies reviewed include: Previous EP, cardiology notes.     ASSESSMENT AND PLAN:  #) ***   #) ***    Current medicines are reviewed at length with the patient today.   The patient {ACTIONS; HAS/DOES NOT HAVE:19233} concerns regarding her medicines.  The following changes were made today:  {NONE DEFAULTED:18576}  Labs/ tests ordered today include: *** No orders of the defined types were placed in this encounter.    Disposition: Follow up with {EPMDS:28135} in {EPFOLLOW UP:28173}   Signed, Sherie Don, NP  04/23/23  5:00 PM  Electrophysiology [facility]

## 2023-04-25 ENCOUNTER — Ambulatory Visit: Payer: Medicare Other | Attending: Cardiology | Admitting: Cardiology

## 2023-04-25 ENCOUNTER — Encounter: Payer: Self-pay | Admitting: Cardiology

## 2023-04-25 VITALS — BP 130/86 | HR 108 | Ht 64.0 in | Wt 154.0 lb

## 2023-04-25 DIAGNOSIS — I4819 Other persistent atrial fibrillation: Secondary | ICD-10-CM

## 2023-04-25 DIAGNOSIS — I495 Sick sinus syndrome: Secondary | ICD-10-CM

## 2023-04-25 DIAGNOSIS — Z950 Presence of cardiac pacemaker: Secondary | ICD-10-CM

## 2023-04-25 LAB — CUP PACEART INCLINIC DEVICE CHECK
Battery Remaining Longevity: 124 mo
Battery Voltage: 3.1 V
Brady Statistic RV Percent Paced: 5.2 %
Date Time Interrogation Session: 20240506113521
Implantable Lead Connection Status: 753985
Implantable Lead Implant Date: 20240424
Implantable Lead Location: 753860
Implantable Lead Model: 1948
Implantable Pulse Generator Implant Date: 20240424
Lead Channel Impedance Value: 725 Ohm
Lead Channel Pacing Threshold Amplitude: 0.5 V
Lead Channel Pacing Threshold Amplitude: 0.5 V
Lead Channel Pacing Threshold Pulse Width: 0.5 ms
Lead Channel Pacing Threshold Pulse Width: 0.5 ms
Lead Channel Sensing Intrinsic Amplitude: 11 mV
Lead Channel Setting Pacing Amplitude: 3.5 V
Lead Channel Setting Pacing Pulse Width: 0.5 ms
Lead Channel Setting Sensing Sensitivity: 2 mV
Pulse Gen Model: 1272
Pulse Gen Serial Number: 8124077

## 2023-04-25 MED ORDER — METOPROLOL TARTRATE 50 MG PO TABS: 50.0000 mg | ORAL_TABLET | Freq: Two times a day (BID) | ORAL | 2 refills | Status: DC

## 2023-04-25 NOTE — Patient Instructions (Addendum)
OK to shower Let warm soapy water run down incision Do not scrub incision Pat dry with towel  Keep incision open to air - no ointments, creams, salves, or bandages.  Call device clinic if incision opens, has drainage, or begins to hurt more.      Medication Instructions:  INCREASE metoprolol tartrate (Lopressor) to 50 mg by mouth twice a day  *If you need a refill on your cardiac medications before your next appointment, please call your pharmacy*   Lab Work: No labs ordered  If you have labs (blood work) drawn today and your tests are completely normal, you will receive your results only by: MyChart Message (if you have MyChart) OR A paper copy in the mail If you have any lab test that is abnormal or we need to change your treatment, we will call you to review the results.   Testing/Procedures: No testing ordered   Follow-Up: At [facility], you and your health needs are our priority.  As part of our continuing mission to provide you with exceptional heart care, we have created designated Provider Care Teams.  These Care Teams include your primary Cardiologist (physician) and Advanced Practice Providers (APPs -  Physician Assistants and Nurse Practitioners) who all work together to provide you with the care you need, when you need it.  We recommend signing up for the patient portal called "MyChart".  Sign up information is provided on this After Visit Summary.  MyChart is used to connect with patients for Virtual Visits (Telemedicine).  Patients are able to view lab/test results, encounter notes, upcoming appointments, etc.  Non-urgent messages can be sent to your provider as well.   To learn more about what you can do with MyChart, go to [email or web address].    Your next appointment:   As already scheduled on 07/19/23  Provider:   Sherryl Manges, MD   Other Instructions Please schedule office visit with Dr. Darrick Huntsman to discuss concerns about memory.

## 2023-04-26 ENCOUNTER — Other Ambulatory Visit: Payer: Self-pay | Admitting: Internal Medicine

## 2023-04-26 ENCOUNTER — Telehealth: Payer: Self-pay | Admitting: Internal Medicine

## 2023-04-26 NOTE — Telephone Encounter (Signed)
Contacted [name] to schedule their annual wellness visit. Call back at later date: REQ CB END 04/2023  Verlee Rossetti; Care Guide Ambulatory Clinical Support [facility] Direct Dial: [phone]

## 2023-04-26 NOTE — Telephone Encounter (Signed)
Pt husband called stating pt has a Visual merchandiser in and pt need to get blood work done with a follow-up appointment. The provider does not have anything currently for an appointment

## 2023-04-26 NOTE — Telephone Encounter (Signed)
Duplicate request for appt

## 2023-04-26 NOTE — Telephone Encounter (Signed)
[name]  Phone: [phone] Provider: Duncan Dull, MD   Spoke with patient to scheduled her AWV.  She stated the she need a hospital FU appt and labs asap with provider. Her spouse stated they were told to call office to get this setup this up.  Please advise.

## 2023-04-26 NOTE — Telephone Encounter (Signed)
No available appts until Monday at 4:30. Would you like for me to move someone from this week to the 4:30 on Monday.

## 2023-04-26 NOTE — Telephone Encounter (Signed)
I can schedule them for Monday at 4:30. Is there any labs that you would like to have drawn? If so I will have them come early before the lab closes.

## 2023-04-27 NOTE — Telephone Encounter (Signed)
LMTCB. Need to schedule pt for a lab appt before her appt with Dr. Darrick Huntsman on 05/03/2023.

## 2023-04-28 NOTE — Telephone Encounter (Signed)
Pt spouse returned Shanda Bumps CMA call. I booked lab appt for 04/29/23 @ 2pm

## 2023-04-28 NOTE — Telephone Encounter (Signed)
noted 

## 2023-04-29 ENCOUNTER — Other Ambulatory Visit (INDEPENDENT_AMBULATORY_CARE_PROVIDER_SITE_OTHER): Payer: Medicare Other

## 2023-04-29 DIAGNOSIS — R7303 Prediabetes: Secondary | ICD-10-CM

## 2023-04-29 DIAGNOSIS — I10 Essential (primary) hypertension: Secondary | ICD-10-CM | POA: Diagnosis not present

## 2023-04-29 LAB — BASIC METABOLIC PANEL
BUN: 14 mg/dL (ref 6–23)
CO2: 26 mEq/L (ref 19–32)
Calcium: 9.2 mg/dL (ref 8.4–10.5)
Chloride: 99 mEq/L (ref 96–112)
Creatinine, Ser: 0.7 mg/dL (ref 0.40–1.20)
GFR: 78.94 mL/min (ref >60.00)
Glucose, Bld: 95 mg/dL (ref 70–99)
Potassium: 4.6 mEq/L (ref 3.5–5.1)
Sodium: 133 mEq/L — ABNORMAL LOW (ref 135–145)

## 2023-04-29 LAB — MICROALBUMIN / CREATININE URINE RATIO
Creatinine,U: 51.2 mg/dL
Microalb Creat Ratio: 1.4 mg/g (ref 0.0–30.0)
Microalb, Ur: <0.7 mg/dL (ref 0.0–1.9)

## 2023-04-30 ENCOUNTER — Other Ambulatory Visit: Payer: Self-pay

## 2023-04-30 ENCOUNTER — Emergency Department: Payer: Medicare Other

## 2023-04-30 ENCOUNTER — Emergency Department
Admission: EM | Admit: 2023-04-30 | Discharge: 2023-04-30 | Disposition: A | Discharge status: Home / Self Care | Payer: Medicare Other | Attending: Emergency Medicine | Admitting: Emergency Medicine

## 2023-04-30 ENCOUNTER — Encounter: Payer: Self-pay | Admitting: Emergency Medicine

## 2023-04-30 DIAGNOSIS — R001 Bradycardia, unspecified: Secondary | ICD-10-CM | POA: Insufficient documentation

## 2023-04-30 DIAGNOSIS — Z950 Presence of cardiac pacemaker: Secondary | ICD-10-CM | POA: Diagnosis not present

## 2023-04-30 DIAGNOSIS — N189 Chronic kidney disease, unspecified: Secondary | ICD-10-CM | POA: Diagnosis not present

## 2023-04-30 DIAGNOSIS — R6889 Other general symptoms and signs: Secondary | ICD-10-CM | POA: Diagnosis not present

## 2023-04-30 DIAGNOSIS — I129 Hypertensive chronic kidney disease with stage 1 through stage 4 chronic kidney disease, or unspecified chronic kidney disease: Secondary | ICD-10-CM | POA: Insufficient documentation

## 2023-04-30 DIAGNOSIS — R079 Chest pain, unspecified: Secondary | ICD-10-CM | POA: Diagnosis not present

## 2023-04-30 DIAGNOSIS — T82519A Breakdown (mechanical) of unspecified cardiac and vascular devices and implants, initial encounter: Secondary | ICD-10-CM | POA: Diagnosis not present

## 2023-04-30 DIAGNOSIS — Z743 Need for continuous supervision: Secondary | ICD-10-CM | POA: Diagnosis not present

## 2023-04-30 DIAGNOSIS — I499 Cardiac arrhythmia, unspecified: Secondary | ICD-10-CM | POA: Diagnosis not present

## 2023-04-30 LAB — TROPONIN I (HIGH SENSITIVITY)
Troponin I (High Sensitivity): 17 ng/L (ref <18)
Troponin I (High Sensitivity): 14 ng/L (ref <18)

## 2023-04-30 LAB — CBC
HCT: 37.4 % (ref 36.0–46.0)
Hemoglobin: 11.8 g/dL — ABNORMAL LOW (ref 12.0–15.0)
MCH: 31 pg (ref 26.0–34.0)
MCHC: 31.6 g/dL (ref 30.0–36.0)
MCV: 98.2 fL (ref 80.0–100.0)
Platelets: 342 10*3/uL (ref 150–400)
RBC: 3.81 MIL/uL — ABNORMAL LOW (ref 3.87–5.11)
RDW: 13.6 % (ref 11.5–15.5)
WBC: 7.4 10*3/uL (ref 4.0–10.5)
nRBC: 0 % (ref 0.0–0.2)

## 2023-04-30 LAB — BASIC METABOLIC PANEL
Anion gap: 6 (ref 5–15)
BUN: 15 mg/dL (ref 8–23)
CO2: 24 mmol/L (ref 22–32)
Calcium: 8.9 mg/dL (ref 8.9–10.3)
Chloride: 101 mmol/L (ref 98–111)
Creatinine, Ser: 0.84 mg/dL (ref 0.44–1.00)
GFR, Estimated: >60 mL/min (ref >60)
Glucose, Bld: 259 mg/dL — ABNORMAL HIGH (ref 70–99)
Potassium: 3.8 mmol/L (ref 3.5–5.1)
Sodium: 131 mmol/L — ABNORMAL LOW (ref 135–145)

## 2023-04-30 NOTE — ED Notes (Signed)
Tech named Cheron Schaumann should be calling back in regards to the interrogation of the patient's pacemaker. Will likely be coming on site to do so.

## 2023-04-30 NOTE — Discharge Instructions (Signed)
You have been seen in the emergency department for a low heart rate.  Your heart rate in the 50s is appropriate.  Your workup has shown no concerning findings.  Please follow-up with your heart doctor.  Return to the emergency department for any chest pain, trouble breathing, or any other symptom personally concerning to yourself.

## 2023-04-30 NOTE — ED Notes (Signed)
Nursing Secretary contacted [employer] in order to get the patient's pacemaker interrogated.

## 2023-04-30 NOTE — ED Notes (Signed)
[employer] Rep called and stated that the report from the interrogation showed normal function with about 20% firing. MD notified.

## 2023-04-30 NOTE — ED Triage Notes (Signed)
Pt to ED via [facility] from home for chest pain and shortness of breath. Pt reports that she was seen recently and states that she was in A. Fib. Pt has hx/o A.Fib and had a pace maker placed about 6 months ago. EMS reports that pt is in A. Fib currently at a rate of 40-60 bmp. Pt does not have increased work of breathing and is able to speak in complete sentences at this time. Pt states that she is still having some chest pain but it has improved since she called EMS. Pt is in NAD.

## 2023-04-30 NOTE — ED Provider Notes (Signed)
[facility] Provider Note    Event Date/Time   First MD Initiated Contact with Patient 04/30/23 [phone]     (approximate)  History   Chief Complaint: Chest Pain and Shortness of Breath  HPI  [name] [name] is a 87 y.o. female with a past medical history of atrial fibrillation/flutter, CKD, gastric reflux, hypertension, hyperlipidemia, recent pacemaker implanted for tachy/bradycardia syndrome, presents to the emergency department for low heart rate.  According to the husband they were discharged from the hospital a little over a week ago after the patient was admitted for passing out ultimately thought to be due to the heart and had a pacemaker placed.  I reviewed patient's cardiology notes from Dr. Graciela Husbands it appears that the patient had an Abbott pacemaker placed 2 weeks ago.  Husband noted today when checking the patient's pulse and blood pressure that the pulse rate was lower around 50 bpm.  He thought that the pacemaker was supposed to kick on anytime it went below 70, he was concerned so he brought the patient to the emergency department.  Patient states she has been experiencing some chest pain but she points to the area overlying the pacemaker as the area that has been bothering her at times.  Denies any chest pain currently.  Denies any shortness of breath currently.  Physical Exam   Triage Vital Signs: ED Triage Vitals  Enc Vitals Group     BP 04/30/23 0923 (!) 134/49     Pulse Rate 04/30/23 0923 (!) 50     Resp 04/30/23 0923 19     Temp 04/30/23 0923 98.7 F (37.1 C)     Temp Source 04/30/23 0923 Oral     SpO2 04/30/23 0918 94 %     Weight 04/30/23 0925 152 lb (68.9 kg)     Height 04/30/23 0925 5\' 4"  (1.626 m)     Head Circumference --      Peak Flow --      Pain Score 04/30/23 0924 3     Pain Loc --      Pain Edu? --      Excl. in GC? --     Most recent vital signs: Vitals:   04/30/23 0923 04/30/23 0930  BP: (!) 134/49 131/61  Pulse: (!) 50  (!) 54  Resp: 19 18  Temp: 98.7 F (37.1 C)   SpO2: 90% 95%    General: Awake, no distress.  CV:  Good peripheral perfusion.  Regular rate and rhythm  Resp:  Normal effort.  Equal breath sounds bilaterally.  Abd:  No distention.  Soft, nontender.  No rebound or guarding.  ED Results / Procedures / Treatments   EKG  EKG viewed and interpreted by myself shows a normal sinus rhythm at 60 bpm with a slightly widened QRS, normal axis, largely normal intervals with nonspecific ST changes.  RADIOLOGY  I have reviewed and interpreted the chest x-ray images.  Cardiomegaly but I do not see any obvious consolidation. Radiology has read cardiac enlargement without acute finding.   MEDICATIONS ORDERED IN ED: Medications - No data to display   IMPRESSION / MDM / ASSESSMENT AND PLAN / ED COURSE  I reviewed the triage vital signs and the nursing notes.  Patient's presentation is most consistent with acute presentation with potential threat to life or bodily function.  Patient presents to the emergency department for a low heart rate around 50 bpm.  Husband was concerned as he believed the pacemaker was  supposed to kick on any time it went below 70.  Patient's blood pressure is normal, she has no complaints currently.  She appears well.  States she has been experiencing some intermittent discomfort over the pacemaker placement site.  EKG around 60 bpm I do not see any obvious pacer spikes at this time.  Will attempt to have the pacemaker interrogated.  Patient's lab work is thus far reassuring with a normal CBC with a normal white blood cell count, reassuring chemistry and a negative troponin.  I reviewed the patient's most recent cardiology note from the PA on 04/25/2023 at that time the patient's metoprolol was increased.  The increase metoprolol very likely could account for the patient's lower heart rate but likely not low enough to cause the pacemaker to begin pacing.  We will continue to closely  monitor while awaiting pacemaker interrogation.  Patient's workup is reassuring, CBC is normal, chemistry is normal, troponin negative x 2.  Chest x-ray is clear.  EKG reassuring.  I received the interpretation results.  Base rate of the patient's pacemaker is 50, she is being paced approximately 20% of the time, everything appears to be working appropriately.  Patient is very reassured by the workup.  Will follow-up with her cardiologist.  Provided return precautions.  FINAL CLINICAL IMPRESSION(S) / ED DIAGNOSES   Bradycardia    Note:  This document was prepared using Dragon voice recognition software and may include unintentional dictation errors.   Minna Antis, MD 04/30/23 1431

## 2023-05-02 ENCOUNTER — Telehealth: Payer: Self-pay

## 2023-05-02 ENCOUNTER — Other Ambulatory Visit: Payer: Self-pay | Admitting: Internal Medicine

## 2023-05-02 NOTE — Telephone Encounter (Signed)
-----   Message from Sherlene Shams, MD sent at 05/02/2023  1:20 PM EDT ----- Labs are unchanged.  Continue current medications

## 2023-05-02 NOTE — Telephone Encounter (Signed)
LMTCB in regards to lab results.  

## 2023-05-03 ENCOUNTER — Ambulatory Visit (INDEPENDENT_AMBULATORY_CARE_PROVIDER_SITE_OTHER): Payer: Medicare Other | Admitting: Internal Medicine

## 2023-05-03 ENCOUNTER — Encounter: Payer: Self-pay | Admitting: Internal Medicine

## 2023-05-03 VITALS — BP 138/72 | HR 51 | Temp 98.1°F | Ht 64.0 in | Wt 160.2 lb

## 2023-05-03 DIAGNOSIS — R410 Disorientation, unspecified: Secondary | ICD-10-CM

## 2023-05-03 DIAGNOSIS — I455 Other specified heart block: Secondary | ICD-10-CM | POA: Diagnosis not present

## 2023-05-03 DIAGNOSIS — E871 Hypo-osmolality and hyponatremia: Secondary | ICD-10-CM

## 2023-05-03 NOTE — Assessment & Plan Note (Signed)
Secondary to tachybrady syndrome, s/p pacemaker implantation

## 2023-05-03 NOTE — Progress Notes (Signed)
Subjective:  Patient ID: [name] [name], female    DOB: December 02, 1936  Age: 87 y.o. MRN: [id number]  CC: The primary encounter diagnosis was Hyponatremia. Diagnoses of Sinus pause and Confusion were also pertinent to this visit.   HPI [name] [name] presents for  Chief Complaint  Patient presents with   Medical Management of Chronic Issues    Follow up from Cardiology   Admitted 4/18 with syncope secondary to tachy/bardy syndrome.  Now s./p pacemaker implantation   , discharged April 25 .  Returned to ER on May 1 because husband was concerned that her pulse was too slow at 50 bpm . Pacemaker was interrogated and found to be pacing about 20% of the time.  Reassured ,   but her husband [name] remains  aggravated because he feels that her heart rate is "too low"  for her .  She has felt low energy since her hospitalization .  She was noted to be hyponatremic for the last several days,  and her appetite has been poor.  Husband h has been noticing decreased short term memory , increased confusion, and he has been managing her medications    Outpatient Medications Prior to Visit  Medication Sig Dispense Refill   acetaminophen (TYLENOL) 500 MG tablet Take 1,000 mg by mouth every 6 (six) hours as needed (pain).      aluminum-magnesium hydroxide 200-200 MG/5ML suspension Take 20 mLs by mouth every 6 (six) hours as needed for indigestion.      apixaban (ELIQUIS) 5 MG TABS tablet TAKE ONE TABLET TWICE DAILY 60 tablet 5   Carboxymethylcellul-Glycerin (LUBRICATING EYE DROPS OP) Place 1 drop into both eyes daily as needed (dry eyes).     cholecalciferol (VITAMIN D) 1000 units tablet Take 1,000 Units by mouth daily.     Coenzyme Q10 (COQ10) 200 MG CAPS Take 200 mg by mouth daily.     diltiazem (CARDIZEM CD) 180 MG 24 hr capsule TAKE 1 CAPSULE (180 MG) BY MOUTH EVERY EVENING 90 capsule 1   metoprolol tartrate (LOPRESSOR) 50 MG tablet Take 1 tablet (50 mg total) by mouth 2 (two) times daily. 180 tablet 2    pantoprazole (PROTONIX) 40 MG tablet TAKE 1 TABLET BY MOUTH DAILY 90 tablet 1   pravastatin (PRAVACHOL) 20 MG tablet TAKE 1 TABLET BY MOUTH DAILY 90 tablet 1   Probiotic Product (PROBIOTIC ADVANCED PO) Take 1 tablet by mouth daily.     sodium chloride (OCEAN) 0.65 % SOLN nasal spray Place 1 spray into both nostrils daily as needed for congestion.     amoxicillin (AMOXIL) 500 MG capsule Take 500 mg by mouth 3 (three) times daily. (Patient not taking: Reported on 05/03/2023)     No facility-administered medications prior to visit.    Review of Systems;  Patient denies headache, fevers, malaise, unintentional weight loss, skin rash, eye pain, sinus congestion and sinus pain, sore throat, dysphagia,  hemoptysis , cough, dyspnea, wheezing, chest pain, palpitations, orthopnea, edema, abdominal pain, nausea, melena, diarrhea, constipation, flank pain, dysuria, hematuria, urinary  Frequency, nocturia, numbness, tingling, seizures,  Focal weakness, Loss of consciousness,  Tremor, insomnia, depression, anxiety, and suicidal ideation.      Objective:  BP 138/72   Pulse (!) 51   Temp 98.1 F (36.7 C) (Oral)   Ht 5\' 4"  (1.626 m)   Wt 160 lb 3.2 oz (72.7 kg)   SpO2 96%   BMI 27.50 kg/m   BP Readings from Last 3 Encounters:  05/03/23  138/72  04/30/23 (!) 125/55  04/25/23 130/86    Wt Readings from Last 3 Encounters:  05/03/23 160 lb 3.2 oz (72.7 kg)  04/30/23 152 lb (68.9 kg)  04/25/23 154 lb (69.9 kg)    Physical Exam Vitals reviewed.  Constitutional:      General: She is not in acute distress.    Appearance: Normal appearance. She is normal weight. She is not ill-appearing, toxic-appearing or diaphoretic.  HENT:     Head: Normocephalic.  Eyes:     General: No scleral icterus.       Right eye: No discharge.        Left eye: No discharge.     Conjunctiva/sclera: Conjunctivae normal.  Cardiovascular:     Rate and Rhythm: Normal rate and regular rhythm.     Heart sounds: Normal  heart sounds.  Pulmonary:     Effort: Pulmonary effort is normal. No respiratory distress.     Breath sounds: Normal breath sounds.  Musculoskeletal:        General: Normal range of motion.  Skin:    General: Skin is warm and dry.  Neurological:     General: No focal deficit present.     Mental Status: She is alert and oriented to person, place, and time. Mental status is at baseline.  Psychiatric:        Mood and Affect: Mood normal.        Behavior: Behavior normal.        Thought Content: Thought content normal.        Judgment: Judgment normal.    Lab Results  Component Value Date   HGBA1C 6.5 12/29/2022   HGBA1C 6.5 05/26/2022   HGBA1C 6.0 (A) 02/04/2021    Lab Results  Component Value Date   CREATININE 0.84 04/30/2023   CREATININE 0.70 04/29/2023   CREATININE 0.63 04/13/2023    Lab Results  Component Value Date   WBC 7.4 04/30/2023   HGB 11.8 (L) 04/30/2023   HCT 37.4 04/30/2023   PLT 342 04/30/2023   GLUCOSE 259 (H) 04/30/2023   CHOL 149 12/29/2022   TRIG 65.0 12/29/2022   HDL 66.10 12/29/2022   LDLDIRECT 72.0 12/29/2022   LDLCALC 70 12/29/2022   ALT 20 12/29/2022   AST 22 12/29/2022   NA 131 (L) 04/30/2023   K 3.8 04/30/2023   CL 101 04/30/2023   CREATININE 0.84 04/30/2023   BUN 15 04/30/2023   CO2 24 04/30/2023   TSH 0.971 04/08/2023   INR 1.3 02/12/2019   HGBA1C 6.5 12/29/2022   MICROALBUR <0.7 04/29/2023    DG Chest 2 View  Result Date: 04/30/2023 CLINICAL DATA:  Chest pain. EXAM: CHEST - 2 VIEW COMPARISON:  04/13/2023. FINDINGS: There is a left chest wall pacer device with lead in the right ventricle. Stable cardiac enlargement. Aortic atherosclerotic calcifications. No pleural fluid, interstitial edema or airspace consolidation. Visualized osseous structures are unremarkable. IMPRESSION: Cardiac enlargement. No acute findings. Electronically Signed   By: Signa Kell M.D.   On: 04/30/2023 10:03    Assessment & Plan:   .Hyponatremia Assessment & Plan: Advised to increase hydration with Gatorade    Sinus pause Assessment & Plan: Secondary to tachybrady syndrome, s/p pacemaker implantation    Confusion Assessment & Plan: She does apear to have  mild cognitive impairment.  Might be related to sinus pauses and her syncopal episode.  Head CT was non-acute. Aggravated by recent anesthesia and procedure.  Will return one month for formal evaluation.  I provided 36 minutes of face-to-face time during this encounter reviewing patient's last visit with me, patient's  most recent hospitalization , follow up with cardiology, recent surgical and non surgical procedures, previous  labs and imaging studies, counseling on currently addressed issues,  and post visit ordering to diagnostics and therapeutics .   Follow-up: Return in about 4 weeks (around 05/31/2023).   Sherlene Shams, MD

## 2023-05-03 NOTE — Assessment & Plan Note (Signed)
She does apear to have  mild cognitive impairment.  Might be related to sinus pauses and her syncopal episode.  Head CT was non-acute. Aggravated by recent anesthesia and procedure.  Will return one month for formal evaluation.

## 2023-05-03 NOTE — Assessment & Plan Note (Signed)
Advised to increase hydration with Gatorade

## 2023-05-03 NOTE — Telephone Encounter (Signed)
Pt spouse called returning Shanda Bumps CMA call. I asked to put pt on the phone, I tried to read previous message to her per provider. She did not understood, and kept on putting the husband on the phone, I tried to tell him, and he can't hear. I told him we will see her today @ 4pm.

## 2023-05-03 NOTE — Patient Instructions (Addendum)
Your sodium level is low.  I recommend drinking Gatorade  and eating some salty foods to to bring it back up.   Your surgery and anesthesia can cause mental status changes and decreased appetite  that may a take a while to recover from   Please return in a month to reeevaluate your mood and memory

## 2023-05-03 NOTE — Telephone Encounter (Signed)
Noted. We will advise of results at appt today.

## 2023-05-04 ENCOUNTER — Ambulatory Visit: Payer: Self-pay

## 2023-05-04 ENCOUNTER — Other Ambulatory Visit: Payer: Self-pay | Admitting: Cardiovascular Disease

## 2023-05-04 DIAGNOSIS — I10 Essential (primary) hypertension: Secondary | ICD-10-CM

## 2023-05-04 DIAGNOSIS — I4821 Permanent atrial fibrillation: Secondary | ICD-10-CM

## 2023-05-04 NOTE — Patient Outreach (Signed)
  Care Coordination   Initial Visit Note   05/04/2023 Name: [name] [name] MRN: [id number] DOB: 01-Oct-1936  [name] [name] is a 87 y.o. year old female who sees Darrick Huntsman, Mar Daring, MD for primary care. I spoke with  [name] by phone today.  What matters to the patients health and wellness today?  SW educated patient on [facility] services.  Patient declines services and feels that she is able to manage her medical needs.  Patient agreed to contact her provider in the future if she needs [facility] services.    Goals Addressed   None     SDOH assessments and interventions completed:  No     Care Coordination Interventions:  No, not indicated   Follow up plan: No further intervention required.   Encounter Outcome:  Pt. Refused

## 2023-05-05 ENCOUNTER — Encounter: Payer: Self-pay | Admitting: Cardiovascular Disease

## 2023-05-05 ENCOUNTER — Ambulatory Visit: Payer: Medicare Other | Attending: Cardiovascular Disease | Admitting: Cardiovascular Disease

## 2023-05-05 VITALS — BP 120/60 | HR 50 | Ht 64.0 in | Wt 160.5 lb

## 2023-05-05 DIAGNOSIS — I10 Essential (primary) hypertension: Secondary | ICD-10-CM

## 2023-05-05 DIAGNOSIS — E785 Hyperlipidemia, unspecified: Secondary | ICD-10-CM

## 2023-05-05 DIAGNOSIS — I6522 Occlusion and stenosis of left carotid artery: Secondary | ICD-10-CM

## 2023-05-05 DIAGNOSIS — I495 Sick sinus syndrome: Secondary | ICD-10-CM

## 2023-05-05 DIAGNOSIS — I4821 Permanent atrial fibrillation: Secondary | ICD-10-CM

## 2023-05-05 MED ORDER — AMLODIPINE BESYLATE 2.5 MG PO TABS: 2.5000 mg | ORAL_TABLET | Freq: Every day | ORAL | 3 refills | Status: DC

## 2023-05-05 MED ORDER — METOPROLOL TARTRATE 25 MG PO TABS: 25.0000 mg | ORAL_TABLET | Freq: Two times a day (BID) | ORAL | 3 refills | Status: DC

## 2023-05-05 NOTE — Progress Notes (Signed)
Cardiology Office Note   Date:  05/05/2023   ID:  [name] [name], DOB Nov 06, 1936, MRN [id number]  PCP:  Sherlene Shams, MD  Cardiologist:   Lorine Bears, MD   Chief Complaint  Patient presents with   Follow-up    6 month f/u c/o weakness, short of memory and having trouble sleepings. Meds reviewed verbally with pt.       History of Present Illness: [name] [name] is a 87 y.o. female who presents for a followup visit regarding permanent atrial fibrillation.   She has known history of  atrial fibrillation/flutter status post multiple cardioversions in the past but currently with chronic atrial fibrillation being treated with rate control.  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    Most recent cardiac catheterization in 2020 showed minimal irregularities with no evidence of obstructive coronary artery disease, normal ejection fraction mildly elevated left ventricular end-diastolic pressure.  She was hospitalized in April with syncope with prolonged pauses.  She underwent single-lead pacemaker placement by Dr. Graciela Husbands.  Echocardiogram during that admission showed normal LV systolic function with mild to moderate mitral regurgitation and mild to moderate tricuspid regurgitation. She has been feeling very sluggish with no energy and her heart rate has been stuck around 50 bpm.  She also reports decline in her memory over the last few months.  No chest pain or shortness of breath.  Past Medical History:  Diagnosis Date   Actinic keratosis    Arthritis    Atrial flutter (HCC) 02/2011   s/p cardioversion    Chest pain    a. H/o cardiac cath x 2-> 2012 -->nl cors;  b. 12/2016 MV: EF 61%, small region of mild perfusion defect in the apical anteroseptal region c/w breast attenuation, no ischemia-->Low risk; c. 06/2019 MV: Mod size, mild inflat ischemia, EF 59%. Cor and Ao Ca2+. Inflat defect more pronounced on this study compared to last; c. 07/2019  Cath: LM nl, LAD min irregs, LCX nl, OM1/2/3 nl, RCA min irregs.   CKD (chronic kidney disease), stage III (HCC)    Concussion with no loss of consciousness 09/01/2016   Cystocele    Degenerative disorder of bone    GERD (gastroesophageal reflux disease)    Headache(784.0)    chronic   Hiatal hernia    Hyperlipidemia    Hypertension    Influenza 01/10/2017   Knee fracture    Migraines    Mobitz type 2 second degree atrioventricular block    a. felt to be 2/2 amiodarone, resolved with decreased amiodarone dose.  Amio since d/c'd.   Permanent atrial fibrillation (HCC)    a. status post multiple DCCVs; b. 2018 - eval for PVI but opted for rate control.   PONV (postoperative nausea and vomiting)    oxycodone and codiene cause N/V    Pre-syncope    a. In setting of dehydration and AKI in the past.   Sleep apnea    Squamous cell carcinoma of skin 11/10/2020   left distal posterior deltoid (EDC 01/15/2021)   Tachycardia induced cardiomyopathy (HCC)    a. Resolved;  b. 08/2017 Echo: EF 50-55%, no rwma, mild MR, mildly to mod dil LA/RA; c. 02/2018 Echo: EF 55-60%, mild MR. Mildly dil LA. Nl RVSP. PASP .   Venous insufficiency    Vertigo     Past Surgical History:  Procedure Laterality Date   ABDOMINAL HYSTERECTOMY  1990   APPENDECTOMY     AUGMENTATION MAMMAPLASTY Bilateral  1986   implants   AUGMENTATION MAMMAPLASTY  1990   AUGMENTATION MAMMAPLASTY  2011   CARDIAC CATHETERIZATION     CARDIOVERSION     x 3   CARDIOVERSION     CARDIOVERSION N/A 02/07/2017   Procedure: CARDIOVERSION;  Surgeon: Iran Ouch, MD;  Location: [facility] ORS;  Service: Cardiovascular;  Laterality: N/A;   CARDIOVERSION N/A 07/22/2017   Procedure: Cardioversion;  Surgeon: Antonieta Iba, MD;  Location: [facility] ORS;  Service: Cardiovascular;  Laterality: N/A;   CATARACT EXTRACTION W/PHACO Right 09/21/2016   Procedure: CATARACT EXTRACTION PHACO AND INTRAOCULAR LENS PLACEMENT (IOC);  Surgeon: Galen Manila,  MD;  Location: [facility] ORS;  Service: Ophthalmology;  Laterality: Right;  [place] 44.1 AP% 16.5 CDE 7.30 Fluid Pack Lot #[id number]   CATARACT EXTRACTION W/PHACO Left 10/19/2016   Procedure: CATARACT EXTRACTION PHACO AND INTRAOCULAR LENS PLACEMENT (IOC);  Surgeon: Galen Manila, MD;  Location: [facility] ORS;  Service: Ophthalmology;  Laterality: Left;  [place] 53.7 AP% 19.5 CDE 10.45 Fluid pack lot # [id number]   CHOLECYSTECTOMY     COMBINED AUGMENTATION MAMMAPLASTY AND ABDOMINOPLASTY     JOINT REPLACEMENT Left 06/04/2013   left knee   KNEE ARTHROSCOPY Right 08/16/2016   Procedure: ARTHROSCOPY KNEE, tear posterior horn medial meniscus, tear anterior and posterior horns of lateral meniscus, chondromalacia of lateral compartment grade 3 patella and grade 4 medial;  Surgeon: Donato Heinz, MD;  Location: [facility] ORS;  Service: Orthopedics;  Laterality: Right;   LEFT HEART CATH AND CORONARY ANGIOGRAPHY Left 07/27/2019   Procedure: LEFT HEART CATH AND CORONARY ANGIOGRAPHY;  Surgeon: Iran Ouch, MD;  Location: [facility] INVASIVE CV LAB;  Service: Cardiovascular;  Laterality: Left;   MASTECTOMY  1986   nipple sparing mastectomy/Bilateral with silicone  breast implants, s/p saline replacements   Multiple orthopedic procedures     NOSE SURGERY     PACEMAKER IMPLANT N/A 04/13/2023   Procedure: PACEMAKER IMPLANT;  Surgeon: Duke Salvia, MD;  Location: [facility] INVAS[facility] CV LAB;  Service: Cardiovascular;  Laterality: N/A;   TEE WITHOUT CARDIOVERSION N/A 09/26/2017   Procedure: TRANSESOPHAGEAL ECHOCARDIOGRAM (TEE);  Surgeon: Pricilla Riffle, MD;  Location: [facility];  Service: Cardiovascular;  Laterality: N/A;   TOTAL KNEE ARTHROPLASTY Left      Current Outpatient Medications  Medication Sig Dispense Refill   acetaminophen (TYLENOL) 500 MG tablet Take 1,000 mg by mouth every 6 (six) hours as needed (pain).      aluminum-magnesium hydroxide 200-200 MG/5ML suspension Take 20 mLs by mouth every 6 (six) hours as needed for  indigestion.      apixaban (ELIQUIS) 5 MG TABS tablet TAKE ONE TABLET TWICE DAILY 60 tablet 5   Carboxymethylcellul-Glycerin (LUBRICATING EYE DROPS OP) Place 1 drop into both eyes daily as needed (dry eyes).     cholecalciferol (VITAMIN D) 1000 units tablet Take 1,000 Units by mouth daily.     Coenzyme Q10 (COQ10) 200 MG CAPS Take 200 mg by mouth daily.     diltiazem (CARDIZEM CD) 180 MG 24 hr capsule TAKE 1 CAPSULE (180 MG) BY MOUTH EVERY EVENING 90 capsule 1   metoprolol tartrate (LOPRESSOR) 50 MG tablet Take 1 tablet (50 mg total) by mouth 2 (two) times daily. (Patient taking differently: Take 25 mg by mouth 2 (two) times daily.) 180 tablet 2   pantoprazole (PROTONIX) 40 MG tablet TAKE 1 TABLET BY MOUTH DAILY 90 tablet 1   pravastatin (PRAVACHOL) 20 MG tablet TAKE 1 TABLET BY MOUTH DAILY 90 tablet 0  Probiotic Product (PROBIOTIC ADVANCED PO) Take 1 tablet by mouth daily.     sodium chloride (OCEAN) 0.65 % SOLN nasal spray Place 1 spray into both nostrils daily as needed for congestion.     No current facility-administered medications for this visit.    Allergies:   Cyclobenzaprine, Iodine, Amiodarone, Biaxin [clarithromycin], Codeine, Digoxin and related, Enalapril, Famotidine, Fluocinonide, Iodinated contrast media, Iodine-131, Losartan potassium, Omnicef [cefdinir], Oxycodone, Promethazine, Sertraline, Tizanidine hcl, Warfarin and related, and Xarelto [rivaroxaban]    Social History:  The patient  reports that she has never smoked. She has never used smokeless tobacco. She reports that she does not drink alcohol and does not use drugs.   Family History:  The patient's family history includes Breast cancer in her maternal grandmother and sister; Breast cancer (age of onset: 53) in her sister; Breast cancer (age of onset: 42) in her sister; Breast cancer (age of onset: 2) in her sister; Diabetes in her brother; Esophageal cancer in her brother; Heart disease in her mother and son; Kidney  failure in her brother; Stomach cancer in her father.    ROS:  Please see the history of present illness.   Otherwise, review of systems are positive for none.   All other systems are reviewed and negative.    PHYSICAL EXAM: VS:  BP 120/60 (BP Location: Left Arm, Patient Position: Sitting, Cuff Size: Normal)   Pulse (!) 50   Ht 5\' 4"  (1.626 m)   Wt 160 lb 8 oz (72.8 kg)   SpO2 97%   BMI 27.55 kg/m  , BMI Body mass index is 27.55 kg/m. GEN: Well nourished, well developed, in no acute distress  HEENT: Left temporal lobe tenderness Neck: no JVD, carotid bruits, or masses Cardiac: Regular rate and rhythm with bradycardia.; no murmurs, rubs, or gallops,no edema  Respiratory:  clear to auscultation bilaterally, normal work of breathing GI: soft, nontender, nondistended, + BS MS: no deformity or atrophy  Skin: warm and dry, no rash Neuro:  Strength and sensation are intact Psych: euthymic mood, full affect   EKG:  EKG is ordered today. The ekg ordered today demonstrates ventricular paced rhythm with underlying atrial fibrillation.  Heart rate is 50 bpm.  Recent Labs: 12/29/2022: ALT 20 04/08/2023: TSH 0.971 04/13/2023: Magnesium 2.0 04/30/2023: BUN 15; Creatinine, Ser 0.84; Hemoglobin 11.8; Platelets 342; Potassium 3.8; Sodium 131    Lipid Panel    Component Value Date/Time   CHOL 149 12/29/2022 1515   TRIG 65.0 12/29/2022 1515   HDL 66.10 12/29/2022 1515   CHOLHDL 2 12/29/2022 1515   VLDL 13.0 12/29/2022 1515   LDLCALC 70 12/29/2022 1515   LDLDIRECT 72.0 12/29/2022 1515      Wt Readings from Last 3 Encounters:  05/05/23 160 lb 8 oz (72.8 kg)  05/03/23 160 lb 3.2 oz (72.7 kg)  04/30/23 152 lb (68.9 kg)       ASSESSMENT AND PLAN:  1.  Tachybradycardia syndrome status post recent pacemaker placement: She has been bradycardic and her heart rate has been stuck around 50 bpm.  She feels very sluggish with no energy.  I elected to discontinue diltiazem today so she does not  have to be paced that often.  2.  Permanent atrial fibrillation: She is bradycardic.  I discontinued diltiazem.  Continue metoprolol tartrate 25 mg twice daily.  If heart rate increases, we can consider increasing the dose of metoprolol.  3. Essential hypertension: I elected to start amlodipine 2.5 mg once daily to replace diltiazem.  4. History of tachycardia-induced cardiomyopathy.   Most recent echocardiogram showed normal LV systolic function.  5. Left carotid calcifications: Carotid Doppler in August of 2018 showed mild nonobstructive disease.  6.  Hyperlipidemia: She continues to take pravastatin.  I reviewed her recent lipid profile which showed an LDL of 67.   Disposition:   FU with me in 1 month.   Signed, Lorine Bears, MD 05/05/23 [facility] [place], [place] [phone]

## 2023-05-05 NOTE — Patient Instructions (Signed)
Medication Instructions:  Your physician recommends the following medication changes.  STOP TAKING: Diltiazem  START TAKING: Amlodipine 2.5 mg once daily   *If you need a refill on your cardiac medications before your next appointment, please call your pharmacy*   Lab Work: None ordered If you have labs (blood work) drawn today and your tests are completely normal, you will receive your results only by: MyChart Message (if you have MyChart) OR A paper copy in the mail If you have any lab test that is abnormal or we need to change your treatment, we will call you to review the results.   Testing/Procedures: None ordered   Follow-Up: At [facility], you and your health needs are our priority.  As part of our continuing mission to provide you with exceptional heart care, we have created designated Provider Care Teams.  These Care Teams include your primary Cardiologist (physician) and Advanced Practice Providers (APPs -  Physician Assistants and Nurse Practitioners) who all work together to provide you with the care you need, when you need it.  We recommend signing up for the patient portal called "MyChart".  Sign up information is provided on this After Visit Summary.  MyChart is used to connect with patients for Virtual Visits (Telemedicine).  Patients are able to view lab/test results, encounter notes, upcoming appointments, etc.  Non-urgent messages can be sent to your provider as well.   To learn more about what you can do with MyChart, go to [email or web address].    Your next appointment:   1 month(s)  Provider:   You may see Lorine Bears, MD or one of the following Advanced Practice Providers on your designated Care Team:   Nicolasa Ducking, NP Eula Listen, PA-C Cadence Fransico Michael, PA-C Charlsie Quest, NP

## 2023-05-18 ENCOUNTER — Telehealth: Payer: Self-pay

## 2023-05-18 NOTE — Telephone Encounter (Signed)
Patient husband called in stating that he thinks her ppm needs to be looked at in person because it is set to 50 and all she does is lay around so tired all the time. Pt husband states he is aware she is getting older but is still concerned

## 2023-05-18 NOTE — Telephone Encounter (Signed)
Spoke with patients husband stated that patient sleeps all the time, feels that patient is depressed, states that patient HR is at 50 all the time. Attempted to explain to patients husband that adjusting setting on PPM may not be a fix for all this, that it sound like there is other factors at play and patient also needs to see PCP and patients husband express his concerns to PCP as well. Patient husband adamant about pacemaker being checked in person. Apt scheduled with Sherie Don in [facility] 05/24/23 at 2:00pm

## 2023-05-23 NOTE — Progress Notes (Unsigned)
Cardiology Office Note Date:  05/24/2023  Patient ID:  [name], [name] 09-Sep-1936, MRN [id number] PCP:  Sherlene Shams, MD  Cardiologist:  Lorine Bears, MD Electrophysiologist: Sherryl Manges, MD    Chief Complaint: bradycardia  History of Present Illness: [name] [name] is a 87 y.o. female with PMH notable for perm AFib, flutter, HTN, tachy-brady s/p recent PPM, ?dementia; seen today for Sherryl Manges, MD for acute visit due to patient's husband's concerns re: HR always at 50.    She saw Dr. Kirke Corin 04/2023 with c/o feeling sluggish and HR at 50. He stopped dilt and decreased lopressor to 25mg  BID. Added amlodipine for BP iso stopping dilt.   Today, patient states that she feels fine. Husband states that she has no energy and naps often. When he checks vital signs at home, her pulse is always 50 and he questions whether this could be leading to her sluggishness.  Her usual weight is around 160/162, but has been steadily increasing and was 169 today. She has increased lower extremity edema. They eat chik-fil-a often (and have for many years).  He isn't sure what BP readings have been, remembers some in the 140/80s.  No bleeding concerns on eliquis  Device Information: St. Jude single chamber PPM, imp 03/2023; dx 3rd deg HB  Past Medical History:  Diagnosis Date   Actinic keratosis    Arthritis    Atrial flutter (HCC) 02/2011   s/p cardioversion    Chest pain    a. H/o cardiac cath x 2-> 2012 -->nl cors;  b. 12/2016 MV: EF 61%, small region of mild perfusion defect in the apical anteroseptal region c/w breast attenuation, no ischemia-->Low risk; c. 06/2019 MV: Mod size, mild inflat ischemia, EF 59%. Cor and Ao Ca2+. Inflat defect more pronounced on this study compared to last; c. 07/2019 Cath: LM nl, LAD min irregs, LCX nl, OM1/2/3 nl, RCA min irregs.   CKD (chronic kidney disease), stage III (HCC)    Concussion with no loss of consciousness 09/01/2016   Cystocele    Degenerative  disorder of bone    GERD (gastroesophageal reflux disease)    Headache(784.0)    chronic   Hiatal hernia    Hyperlipidemia    Hypertension    Influenza 01/10/2017   Knee fracture    Migraines    Mobitz type 2 second degree atrioventricular block    a. felt to be 2/2 amiodarone, resolved with decreased amiodarone dose.  Amio since d/c'd.   Permanent atrial fibrillation (HCC)    a. status post multiple DCCVs; b. 2018 - eval for PVI but opted for rate control.   PONV (postoperative nausea and vomiting)    oxycodone and codiene cause N/V    Pre-syncope    a. In setting of dehydration and AKI in the past.   Sleep apnea    Squamous cell carcinoma of skin 11/10/2020   left distal posterior deltoid (EDC 01/15/2021)   Tachycardia induced cardiomyopathy (HCC)    a. Resolved;  b. 08/2017 Echo: EF 50-55%, no rwma, mild MR, mildly to mod dil LA/RA; c. 02/2018 Echo: EF 55-60%, mild MR. Mildly dil LA. Nl RVSP. PASP .   Venous insufficiency    Vertigo     Past Surgical History:  Procedure Laterality Date   ABDOMINAL HYSTERECTOMY  1990   APPENDECTOMY     AUGMENTATION MAMMAPLASTY Bilateral 1986   implants   AUGMENTATION MAMMAPLASTY  1990   AUGMENTATION MAMMAPLASTY  2011   CARDIAC CATHETERIZATION  CARDIOVERSION     x 3   CARDIOVERSION     CARDIOVERSION N/A 02/07/2017   Procedure: CARDIOVERSION;  Surgeon: Iran Ouch, MD;  Location: [facility] ORS;  Service: Cardiovascular;  Laterality: N/A;   CARDIOVERSION N/A 07/22/2017   Procedure: Cardioversion;  Surgeon: Antonieta Iba, MD;  Location: [facility] ORS;  Service: Cardiovascular;  Laterality: N/A;   CATARACT EXTRACTION W/PHACO Right 09/21/2016   Procedure: CATARACT EXTRACTION PHACO AND INTRAOCULAR LENS PLACEMENT (IOC);  Surgeon: Galen Manila, MD;  Location: [facility] ORS;  Service: Ophthalmology;  Laterality: Right;  [place] 44.1 AP% 16.5 CDE 7.30 Fluid Pack Lot #[id number]   CATARACT EXTRACTION W/PHACO Left 10/19/2016   Procedure: CATARACT  EXTRACTION PHACO AND INTRAOCULAR LENS PLACEMENT (IOC);  Surgeon: Galen Manila, MD;  Location: [facility];  Service: Ophthalmology;  Laterality: Left;  [place] 53.7 AP% 19.5 CDE 10.45 Fluid pack lot # [id number]   CHOLECYSTECTOMY     COMBINED AUGMENTATION MAMMAPLASTY AND ABDOMINOPLASTY     JOINT REPLACEMENT Left 06/04/2013   left knee   KNEE ARTHROSCOPY Right 08/16/2016   Procedure: ARTHROSCOPY KNEE, tear posterior horn medial meniscus, tear anterior and posterior horns of lateral meniscus, chondromalacia of lateral compartment grade 3 patella and grade 4 medial;  Surgeon: Donato Heinz, MD;  Location: [facility];  Service: Orthopedics;  Laterality: Right;   LEFT HEART CATH AND CORONARY ANGIOGRAPHY Left 07/27/2019   Procedure: LEFT HEART CATH AND CORONARY ANGIOGRAPHY;  Surgeon: Iran Ouch, MD;  Location: [facility] INVASIVE CV [facility];  Service: Cardiovascular;  Laterality: Left;   MASTECTOMY  1986   nipple sparing mastectomy/Bilateral with silicone  breast implants, s/p saline replacements   Multiple orthopedic procedures     NOSE SURGERY     PACEMAKER IMPLANT N/A 04/13/2023   Procedure: PACEMAKER IMPLANT;  Surgeon: Duke Salvia, MD;  Location: [facility] INVAS[facility] CV LAB;  Service: Cardiovascular;  Laterality: N/A;   TEE WITHOUT CARDIOVERSION N/A 09/26/2017   Procedure: TRANSESOPHAGEAL ECHOCARDIOGRAM (TEE);  Surgeon: Pricilla Riffle, MD;  Location: [facility] ENDOSCOPY;  Service: Cardiovascular;  Laterality: N/A;   TOTAL KNEE ARTHROPLASTY Left     Current Outpatient Medications  Medication Instructions   acetaminophen (TYLENOL) 1,000 mg, Oral, Every 6 hours PRN   aluminum-magnesium hydroxide 200-200 MG/5ML suspension 20 mLs, Oral, Every 6 hours PRN   amLODipine (NORVASC) 2.5 mg, Oral, Daily   Carboxymethylcellul-Glycerin (LUBRICATING EYE DROPS OP) 1 drop, Both Eyes, Daily PRN   cholecalciferol (VITAMIN D) 1,000 Units, Oral, Daily   CoQ10 200 mg, Oral, Daily   Eliquis 5 mg, Oral, 2 times daily   furosemide  (LASIX) 20 MG tablet Take 1 tablet daily for 3 days, Then take 1 tablet by mouth daily as needed for weight gain > 165 lbs.   metoprolol tartrate (LOPRESSOR) 12.5 mg, Oral, 2 times daily   pantoprazole (PROTONIX) 40 MG tablet TAKE 1 TABLET BY MOUTH DAILY   pravastatin (PRAVACHOL) 20 mg, Oral, Daily   Probiotic Product (PROBIOTIC ADVANCED PO) 1 tablet, Oral, Daily   sodium chloride (OCEAN) 0.65 % SOLN nasal spray 1 spray, Each Nare, Daily PRN    Social History:  The patient  reports that she has never smoked. She has never used smokeless tobacco. She reports that she does not drink alcohol and does not use drugs.   Family History:  The patient's family history includes Breast cancer in her maternal grandmother and sister; Breast cancer (age of onset: 46) in her sister; Breast cancer (age of onset: 70) in her sister; Breast  cancer (age of onset: 3) in her sister; Diabetes in her brother; Esophageal cancer in her brother; Heart disease in her mother and son; Kidney failure in her brother; Stomach cancer in her father.  ROS:  Please see the history of present illness. All other systems are reviewed and otherwise negative.   PHYSICAL EXAM:  VS:  BP 120/78 (BP Location: Left Arm, Patient Position: Sitting, Cuff Size: Normal)   Pulse (!) 50   Ht 5' 4.5" (1.638 m)   Wt 169 lb 2 oz (76.7 kg)   SpO2 98%   BMI 28.58 kg/m  BMI: Body mass index is 28.58 kg/m.  GEN- The patient is well appearing, alert and oriented x 3 today.   Lungs- Clear to ausculation bilaterally, normal work of breathing.  Heart- Regular rate and rhythm, no murmurs, rubs or gallops Extremities- 2-3+ peripheral edema up to bilat thighs, warm, dry Skin-   device pocket well-healed, no tethering   Device interrogation done today and reviewed by myself:  Battery good Lead thresholds, impedence, sensing stable  No episodes Histograms blunted at 50bpm Turned on rate response No further changes made today  EKG is not ordered.  Personal review of EKG from  05/05/2023  shows:  AFib, Intermittent VP, rate 50; LBBB, LAD  Recent Labs: 12/29/2022: ALT 20 04/08/2023: TSH 0.971 04/13/2023: Magnesium 2.0 04/30/2023: BUN 15; Creatinine, Ser 0.84; Hemoglobin 11.8; Platelets 342; Potassium 3.8; Sodium 131  12/29/2022: Cholesterol 149; Direct LDL 72.0; HDL 66.10; LDL Cholesterol 70; Total CHOL/HDL Ratio 2; Triglycerides 65.0; VLDL 13.0   CrCl cannot be calculated (Patient's most recent lab result is older than the maximum 21 days allowed.).   Wt Readings from Last 3 Encounters:  05/24/23 169 lb 2 oz (76.7 kg)  05/05/23 160 lb 8 oz (72.8 kg)  05/03/23 160 lb 3.2 oz (72.7 kg)     Additional studies reviewed include: Previous EP, cardiology notes.   TTE, 04/08/2023 1. Left ventricular ejection fraction, by estimation, is 55 to 60%. The left ventricle has normal function. The left ventricle has no regional wall motion abnormalities. Left ventricular diastolic parameters are indeterminate.   2. Right ventricular systolic function is normal. The right ventricular size is mildly enlarged. There is moderately elevated pulmonary artery systolic pressure. The estimated right ventricular systolic pressure is 51.8 mmHg.   3. Left atrial size was severely dilated.   4. Right atrial size was severely dilated.   5. The mitral valve is normal in structure. Mild to moderate mitral valve regurgitation. No evidence of mitral stenosis.   6. Tricuspid valve regurgitation is mild to moderate.   7. The aortic valve is normal in structure. Aortic valve regurgitation is not visualized. No aortic stenosis is present.   8. The inferior vena cava is normal in size with greater than 50% respiratory variability, suggesting right atrial pressure of 3 mmHg.    ASSESSMENT AND PLAN:  #) Tachy-brady s/p St. Jude PPM #) perm AFib #) bradycardia Ventricular rates quite blunted at lower rate limit Turned on rate response  Decrease lopressor to 12.5mg   BID   #) Hypercoag d/t AFib CHA2DS2-VASc Score = 5 [CHF History: 0, HTN History: 1, Diabetes History: 0, Stroke History: 0, Vascular Disease History: 1, Age Score: 2, Gender Score: 1].  Therefore, the patient's annual risk of stroke is 7.2 %. NOAC - 5mg  eliquis BID, appropriately dosed No bleeding concerns  #) HTN Well-controlled in office today, but unsure readings at home Recommended patient's husband write down measurements for  further evaluation in office  #) Lower extremity edema Likely in setting of bradycardia Most recent echo with preserved LVEF Patient had recent labs with stable electrolytes and Cr Start lasix 20mg  x 3 days, then PRN for weight gain Update BMP next week   Current medicines are reviewed at length with the patient today.   The patient has concerns regarding her medicines.  The following changes were made today:   DECREASE lopressor to 12.5mg  BID START 20mg  lasix daily x 3 days, then as needed for weight gain over 165lb  Labs/ tests ordered today include:  Orders Placed This Encounter  Procedures   Basic metabolic panel     Disposition: Follow up with Dr. Graciela Husbands in  as scheduled  Follow up with Dr. Kirke Corin as scheduled   Signed, Sherie Don, NP  05/24/23  3:54 PM  Electrophysiology [facility]

## 2023-05-24 ENCOUNTER — Encounter: Payer: Self-pay | Admitting: Cardiology

## 2023-05-24 ENCOUNTER — Ambulatory Visit: Payer: Medicare Other | Attending: Cardiology | Admitting: Cardiology

## 2023-05-24 VITALS — BP 120/78 | HR 50 | Ht 64.5 in | Wt 169.1 lb

## 2023-05-24 DIAGNOSIS — I4821 Permanent atrial fibrillation: Secondary | ICD-10-CM

## 2023-05-24 DIAGNOSIS — I2510 Atherosclerotic heart disease of native coronary artery without angina pectoris: Secondary | ICD-10-CM

## 2023-05-24 DIAGNOSIS — E785 Hyperlipidemia, unspecified: Secondary | ICD-10-CM

## 2023-05-24 DIAGNOSIS — I10 Essential (primary) hypertension: Secondary | ICD-10-CM

## 2023-05-24 DIAGNOSIS — I495 Sick sinus syndrome: Secondary | ICD-10-CM | POA: Diagnosis not present

## 2023-05-24 DIAGNOSIS — R001 Bradycardia, unspecified: Secondary | ICD-10-CM | POA: Diagnosis not present

## 2023-05-24 DIAGNOSIS — I6522 Occlusion and stenosis of left carotid artery: Secondary | ICD-10-CM

## 2023-05-24 LAB — CUP PACEART INCLINIC DEVICE CHECK
Battery Remaining Longevity: 110 mo
Battery Voltage: 3.04 V
Brady Statistic RV Percent Paced: 59 %
Date Time Interrogation Session: 20240604155506
Implantable Lead Connection Status: 753985
Implantable Lead Implant Date: 20240424
Implantable Lead Location: 753860
Implantable Lead Model: 1948
Implantable Pulse Generator Implant Date: 20240424
Lead Channel Impedance Value: 687.5 Ohm
Lead Channel Pacing Threshold Amplitude: 0.5 V
Lead Channel Pacing Threshold Amplitude: 0.5 V
Lead Channel Pacing Threshold Pulse Width: 0.5 ms
Lead Channel Pacing Threshold Pulse Width: 0.5 ms
Lead Channel Sensing Intrinsic Amplitude: 9.1 mV
Lead Channel Setting Pacing Amplitude: 3.5 V
Lead Channel Setting Pacing Pulse Width: 0.5 ms
Lead Channel Setting Sensing Sensitivity: 2 mV
Pulse Gen Model: 1272
Pulse Gen Serial Number: 8124077

## 2023-05-24 MED ORDER — METOPROLOL TARTRATE 25 MG PO TABS: 12.5000 mg | ORAL_TABLET | Freq: Two times a day (BID) | ORAL | 1 refills | Status: DC

## 2023-05-24 MED ORDER — FUROSEMIDE 20 MG PO TABS: ORAL_TABLET | ORAL | 0 refills | Status: DC

## 2023-05-24 NOTE — Patient Instructions (Signed)
Medication Instructions:   Your physician has recommended you make the following change in your medication:   DECREASE Metoprolol Tartrate Take 1/2 (12.5 mg) tablet by mouth twice daily.  START Furosemide 20 mg tablet by mouth daily for 3 days, Then take 1 tablet by mouth daily as needed for weight gain > (greater than) 165 lbs.  *If you need a refill on your cardiac medications before your next appointment, please call your pharmacy*   Lab Work:  Your physician recommends that you return for lab work in: 1 WEEK   BMP  - Please go to the [facility]. You will check in at the front desk to the right as you walk into the atrium. Valet Parking is offered if needed. - No appointment needed. You may go any day between 7 am and 6 pm.    If you have labs (blood work) drawn today and your tests are completely normal, you will receive your results only by: MyChart Message (if you have MyChart) OR A paper copy in the mail If you have any lab test that is abnormal or we need to change your treatment, we will call you to review the results.   Testing/Procedures:  No testing ordered today.   Follow-Up: At [facility], you and your health needs are our priority.  As part of our continuing mission to provide you with exceptional heart care, we have created designated Provider Care Teams.  These Care Teams include your primary Cardiologist (physician) and Advanced Practice Providers (APPs -  Physician Assistants and Nurse Practitioners) who all work together to provide you with the care you need, when you need it.  We recommend signing up for the patient portal called "MyChart".  Sign up information is provided on this After Visit Summary.  MyChart is used to connect with patients for Virtual Visits (Telemedicine).  Patients are able to view lab/test results, encounter notes, upcoming appointments, etc.  Non-urgent messages can be sent to your provider as well.   To learn more  about what you can do with MyChart, go to [email or web address].    Your next appointment:   Keep future appointments scheduled  Provider:   Sherie Don, NP    *Please check your Blood pressure and keep a blood pressure log of your Blood pressure readings written down and bring with you for next visit.

## 2023-05-27 ENCOUNTER — Telehealth: Payer: Self-pay | Admitting: Cardiology

## 2023-05-27 NOTE — Telephone Encounter (Signed)
Pt spouse called in stating pt's legs are still swelling after Suzann, NP placed pt on a fluid pill. He states she not having any SOB but he thinks the medication needs to be changed or increased, please advise.

## 2023-05-27 NOTE — Telephone Encounter (Signed)
Spoke to patient's spouse and informed him of the provider's recommendations as follows:  "I wonder if the swelling is d/t amlodipine. Stop lasix, stop amlodipine. I anticipate her BP increasing when the amlodipine is stopped, please let spouse know this so he isn't hyper concerned over the weekend."  Patient's spouse understood read back

## 2023-05-30 ENCOUNTER — Telehealth: Payer: Self-pay | Admitting: Cardiovascular Disease

## 2023-05-30 ENCOUNTER — Other Ambulatory Visit
Admission: RE | Admit: 2023-05-30 | Discharge: 2023-05-30 | Disposition: A | Discharge status: Home / Self Care | Payer: Medicare Other | Source: Ambulatory Visit | Attending: Cardiology | Admitting: Cardiology

## 2023-05-30 ENCOUNTER — Encounter: Payer: Self-pay | Admitting: Cardiology

## 2023-05-30 ENCOUNTER — Ambulatory Visit: Payer: Medicare Other | Attending: Cardiology | Admitting: Cardiology

## 2023-05-30 VITALS — BP 138/60 | HR 70 | Ht 64.0 in | Wt 171.1 lb

## 2023-05-30 DIAGNOSIS — Z9189 Other specified personal risk factors, not elsewhere classified: Secondary | ICD-10-CM

## 2023-05-30 DIAGNOSIS — R600 Localized edema: Secondary | ICD-10-CM | POA: Diagnosis not present

## 2023-05-30 DIAGNOSIS — R4182 Altered mental status, unspecified: Secondary | ICD-10-CM

## 2023-05-30 DIAGNOSIS — Z7189 Other specified counseling: Secondary | ICD-10-CM

## 2023-05-30 DIAGNOSIS — R0602 Shortness of breath: Secondary | ICD-10-CM | POA: Insufficient documentation

## 2023-05-30 DIAGNOSIS — Z638 Other specified problems related to primary support group: Secondary | ICD-10-CM

## 2023-05-30 LAB — COMPREHENSIVE METABOLIC PANEL
ALT: 25 U/L (ref 0–44)
AST: 32 U/L (ref 15–41)
Albumin: 3.9 g/dL (ref 3.5–5.0)
Alkaline Phosphatase: 100 U/L (ref 38–126)
Anion gap: 8 (ref 5–15)
BUN: 14 mg/dL (ref 8–23)
CO2: 25 mmol/L (ref 22–32)
Calcium: 8.8 mg/dL — ABNORMAL LOW (ref 8.9–10.3)
Chloride: 100 mmol/L (ref 98–111)
Creatinine, Ser: 0.85 mg/dL (ref 0.44–1.00)
GFR, Estimated: >60 mL/min (ref >60)
Glucose, Bld: 206 mg/dL — ABNORMAL HIGH (ref 70–99)
Potassium: 3.9 mmol/L (ref 3.5–5.1)
Sodium: 133 mmol/L — ABNORMAL LOW (ref 135–145)
Total Bilirubin: 2 mg/dL — ABNORMAL HIGH (ref 0.3–1.2)
Total Protein: 7 g/dL (ref 6.5–8.1)

## 2023-05-30 LAB — CBC
HCT: 37.1 % (ref 36.0–46.0)
Hemoglobin: 11.7 g/dL — ABNORMAL LOW (ref 12.0–15.0)
MCH: 30.8 pg (ref 26.0–34.0)
MCHC: 31.5 g/dL (ref 30.0–36.0)
MCV: 97.6 fL (ref 80.0–100.0)
Platelets: 243 10*3/uL (ref 150–400)
RBC: 3.8 MIL/uL — ABNORMAL LOW (ref 3.87–5.11)
RDW: 14.1 % (ref 11.5–15.5)
WBC: 5.9 10*3/uL (ref 4.0–10.5)
nRBC: 0 % (ref 0.0–0.2)

## 2023-05-30 NOTE — Progress Notes (Signed)
Cardiology Office Note Date:  05/30/2023  Patient ID:  [name] [name], [name] [name] 1936-12-13, MRN [id number] PCP:  Sherlene Shams, MD  Cardiologist:  Lorine Bears, MD Electrophysiologist: Sherryl Manges, MD    Chief Complaint: Lower extremity edema  History of Present Illness: [name] [name] is a 87 y.o. female with PMH notable for perm AFib, flutter, HTN, tachy-brady s/p recent PPM, ?dementia; seen today for Sherryl Manges, MD for acute visit due to patient's husband's concerns re: HR always at 50.    She saw Dr. Kirke Corin 04/2023 with c/o feeling sluggish and HR at 50. He stopped dilt and decreased lopressor to 25mg  BID. Added amlodipine for BP iso stopping dilt.  I saw the patient last week.  Patient's husband was concerned about pulse always at 50 bpm and increased lower extremity edema.  I further decreased her Lopressor to 12.5twice daily and started her on Lasix 20 mg x 3 days.  I also turned on rate response.   Patient's husband called a few days later stating that patient had not had any increased urinary output.  Via telephone encounter, patient was to stop Lasix and amlodipine. Patient's husband called earlier today stating that her lower extremity edema was worse and requested an appointment today.  Today, patient states that she is having a hard time getting around with her legs so swollen.  She denies shortness of breath, PND, productive cough.  She has decreased appetite in the morning, but eats well at lunch in dinner.  Patient's husband brings a blood pressure log most readings systolic 130-150s, her pulse has been ranging from mid 50s to low 60s.   Her main complaint today is muscle spasms in her neck, stating that she is having difficulty sleeping for longer than a couple hours at a time.  No bleeding concerns on eliquis  Device Information: St. Jude single chamber PPM, imp 03/2023; dx 3rd deg HB  Past Medical History:  Diagnosis Date   Actinic keratosis    Arthritis     Atrial flutter (HCC) 02/2011   s/p cardioversion    Chest pain    a. H/o cardiac cath x 2-> 2012 -->nl cors;  b. 12/2016 MV: EF 61%, small region of mild perfusion defect in the apical anteroseptal region c/w breast attenuation, no ischemia-->Low risk; c. 06/2019 MV: Mod size, mild inflat ischemia, EF 59%. Cor and Ao Ca2+. Inflat defect more pronounced on this study compared to last; c. 07/2019 Cath: LM nl, LAD min irregs, LCX nl, OM1/2/3 nl, RCA min irregs.   CKD (chronic kidney disease), stage III (HCC)    Concussion with no loss of consciousness 09/01/2016   Cystocele    Degenerative disorder of bone    GERD (gastroesophageal reflux disease)    Headache(784.0)    chronic   Hiatal hernia    Hyperlipidemia    Hypertension    Influenza 01/10/2017   Knee fracture    Migraines    Mobitz type 2 second degree atrioventricular block    a. felt to be 2/2 amiodarone, resolved with decreased amiodarone dose.  Amio since d/c'd.   Permanent atrial fibrillation (HCC)    a. status post multiple DCCVs; b. 2018 - eval for PVI but opted for rate control.   PONV (postoperative nausea and vomiting)    oxycodone and codiene cause N/V    Pre-syncope    a. In setting of dehydration and AKI in the past.   Sleep apnea    Squamous cell carcinoma of  skin 11/10/2020   left distal posterior deltoid (EDC 01/15/2021)   Tachycardia induced cardiomyopathy (HCC)    a. Resolved;  b. 08/2017 Echo: EF 50-55%, no rwma, mild MR, mildly to mod dil LA/RA; c. 02/2018 Echo: EF 55-60%, mild MR. Mildly dil LA. Nl RVSP. PASP .   Venous insufficiency    Vertigo     Past Surgical History:  Procedure Laterality Date   ABDOMINAL HYSTERECTOMY  1990   APPENDECTOMY     AUGMENTATION MAMMAPLASTY Bilateral 1986   implants   AUGMENTATION MAMMAPLASTY  1990   AUGMENTATION MAMMAPLASTY  2011   CARDIAC CATHETERIZATION     CARDIOVERSION     x 3   CARDIOVERSION     CARDIOVERSION N/A 02/07/2017   Procedure: CARDIOVERSION;  Surgeon:  Iran Ouch, MD;  Location: [facility];  Service: Cardiovascular;  Laterality: N/A;   CARDIOVERSION N/A 07/22/2017   Procedure: Cardioversion;  Surgeon: Antonieta Iba, MD;  Location: [facility];  Service: Cardiovascular;  Laterality: N/A;   CATARACT EXTRACTION W/PHACO Right 09/21/2016   Procedure: CATARACT EXTRACTION PHACO AND INTRAOCULAR LENS PLACEMENT (IOC);  Surgeon: Galen Manila, MD;  Location: [facility];  Service: Ophthalmology;  Laterality: Right;  [place] 44.1 AP% 16.5 CDE 7.30 Fluid Pack Lot #[id number]   CATARACT EXTRACTION W/PHACO Left 10/19/2016   Procedure: CATARACT EXTRACTION PHACO AND INTRAOCULAR LENS PLACEMENT (IOC);  Surgeon: Galen Manila, MD;  Location: [facility];  Service: Ophthalmology;  Laterality: Left;  [place] 53.7 AP% 19.5 CDE 10.45 Fluid pack lot # [id number]   CHOLECYSTECTOMY     COMBINED AUGMENTATION MAMMAPLASTY AND ABDOMINOPLASTY     JOINT REPLACEMENT Left 06/04/2013   left knee   KNEE ARTHROSCOPY Right 08/16/2016   Procedure: ARTHROSCOPY KNEE, tear posterior horn medial meniscus, tear anterior and posterior horns of lateral meniscus, chondromalacia of lateral compartment grade 3 patella and grade 4 medial;  Surgeon: Donato Heinz, MD;  Location: [facility];  Service: Orthopedics;  Laterality: Right;   LEFT HEART CATH AND CORONARY ANGIOGRAPHY Left 07/27/2019   Procedure: LEFT HEART CATH AND CORONARY ANGIOGRAPHY;  Surgeon: Iran Ouch, MD;  Location: [facility];  Service: Cardiovascular;  Laterality: Left;   MASTECTOMY  1986   nipple sparing mastectomy/Bilateral with silicone  breast implants, s/p saline replacements   Multiple orthopedic procedures     NOSE SURGERY     PACEMAKER IMPLANT N/A 04/13/2023   Procedure: PACEMAKER IMPLANT;  Surgeon: Duke Salvia, MD;  Location: [facility];  Service: Cardiovascular;  Laterality: N/A;   TEE WITHOUT CARDIOVERSION N/A 09/26/2017   Procedure: TRANSESOPHAGEAL ECHOCARDIOGRAM (TEE);  Surgeon: Pricilla Riffle,  MD;  Location: [facility] ENDOSCOPY;  Service: Cardiovascular;  Laterality: N/A;   TOTAL KNEE ARTHROPLASTY Left     Current Outpatient Medications  Medication Instructions   acetaminophen (TYLENOL) 1,000 mg, Oral, Every 6 hours PRN   aluminum-magnesium hydroxide 200-200 MG/5ML suspension 20 mLs, Oral, Every 6 hours PRN   amLODipine (NORVASC) 2.5 mg, Oral, Daily   amoxicillin (AMOXIL) 500 mg, Oral, Every 6 hours   Carboxymethylcellul-Glycerin (LUBRICATING EYE DROPS OP) 1 drop, Both Eyes, Daily PRN   cholecalciferol (VITAMIN D) 1,000 Units, Oral, Daily   CoQ10 200 mg, Oral, Daily   Eliquis 5 mg, Oral, 2 times daily   furosemide (LASIX) 20 MG tablet Take 1 tablet daily for 3 days, Then take 1 tablet by mouth daily as needed for weight gain > 165 lbs.   metoprolol tartrate (LOPRESSOR) 12.5 mg, Oral, 2 times daily  pantoprazole (PROTONIX) 40 MG tablet TAKE 1 TABLET BY MOUTH DAILY   pravastatin (PRAVACHOL) 20 mg, Oral, Daily   Probiotic Product (PROBIOTIC ADVANCED PO) 1 tablet, Oral, Daily   sodium chloride (OCEAN) 0.65 % SOLN nasal spray 1 spray, Each Nare, Daily PRN    Social History:  The patient  reports that she has never smoked. She has never used smokeless tobacco. She reports that she does not drink alcohol and does not use drugs.   Family History:  The patient's family history includes Breast cancer in her maternal grandmother and sister; Breast cancer (age of onset: 36) in her sister; Breast cancer (age of onset: 34) in her sister; Breast cancer (age of onset: 82) in her sister; Diabetes in her brother; Esophageal cancer in her brother; Heart disease in her mother and son; Kidney failure in her brother; Stomach cancer in her father.  ROS:  Please see the history of present illness. All other systems are reviewed and otherwise negative.   PHYSICAL EXAM:  VS:  BP 138/60 (BP Location: Left Arm, Patient Position: Sitting, Cuff Size: Large)   Pulse 70   Ht 5\' 4"  (1.626 m)   Wt 171 lb 2 oz  (77.6 kg)   SpO2 98%   BMI 29.37 kg/m  BMI: Body mass index is 29.37 kg/m.  GEN- The patient is well appearing, alert and oriented x 3 today.   Lungs- Clear to ausculation bilaterally, normal work of breathing.  Heart- Regular rate and rhythm, no murmurs, rubs or gallops Extremities- 3-4+ peripheral edema up to bilat thighs, warm, dry Skin-   device pocket well-healed, no tethering   Device interrogation done today and reviewed by myself:  Battery good Lead thresholds, impedence, sensing stable  No episodes No changes made today  EKG is not ordered. Personal review of EKG from  05/05/2023  shows:  AFib, Intermittent VP, rate 50; LBBB, LAD  Recent Labs: 12/29/2022: ALT 20 04/08/2023: TSH 0.971 04/13/2023: Magnesium 2.0 04/30/2023: BUN 15; Creatinine, Ser 0.84; Hemoglobin 11.8; Platelets 342; Potassium 3.8; Sodium 131  12/29/2022: Cholesterol 149; Direct LDL 72.0; HDL 66.10; LDL Cholesterol 70; Total CHOL/HDL Ratio 2; Triglycerides 65.0; VLDL 13.0   CrCl cannot be calculated (Patient's most recent lab result is older than the maximum 21 days allowed.).   Wt Readings from Last 3 Encounters:  05/30/23 171 lb 2 oz (77.6 kg)  05/24/23 169 lb 2 oz (76.7 kg)  05/05/23 160 lb 8 oz (72.8 kg)     Additional studies reviewed include: Previous EP, cardiology notes.   TTE, 04/08/2023 1. Left ventricular ejection fraction, by estimation, is 55 to 60%. The left ventricle has normal function. The left ventricle has no regional wall motion abnormalities. Left ventricular diastolic parameters are indeterminate.   2. Right ventricular systolic function is normal. The right ventricular size is mildly enlarged. There is moderately elevated pulmonary artery systolic pressure. The estimated right ventricular systolic pressure is 51.8 mmHg.   3. Left atrial size was severely dilated.   4. Right atrial size was severely dilated.   5. The mitral valve is normal in structure. Mild to moderate mitral valve  regurgitation. No evidence of mitral stenosis.   6. Tricuspid valve regurgitation is mild to moderate.   7. The aortic valve is normal in structure. Aortic valve regurgitation is not visualized. No aortic stenosis is present.   8. The inferior vena cava is normal in size with greater than 50% respiratory variability, suggesting right atrial pressure of 3 mmHg.  ASSESSMENT AND PLAN:  #) Tachy-brady s/p St. Jude PPM #) perm AFib #) bradycardia Heart rate variability as per device histograms Device functioning well, see Paceart for details Continue lopressor to 12.5mg  BID   #) Hypercoag d/t AFib CHA2DS2-VASc Score = 5 [CHF History: 0, HTN History: 1, Diabetes History: 0, Stroke History: 0, Vascular Disease History: 1, Age Score: 2, Gender Score: 1].  Therefore, the patient's annual risk of stroke is 7.2 %. NOAC - 5mg  eliquis BID, appropriately dosed No bleeding concerns  #) HTN Well-controlled in office today, but unsure readings at home Recommended patient's husband write down measurements for further evaluation in office  #) Lower extremity edema Edema did not improve with addition of Lasix or with stopping amlodipine Most recent echo showed preserved ejection fraction but given new lower extremity edema will obtain limited echo CBC and CMET for further evaluation Further recommendations once labs result   Current medicines are reviewed at length with the patient today.   The patient has concerns regarding her medicines.  The following changes were made today:   None  Labs/ tests ordered today include:  Orders Placed This Encounter  Procedures   Comp Met (CMET)   CBC   ECHOCARDIOGRAM LIMITED     Disposition: Follow up with Dr. Graciela Husbands  as scheduled     Signed, Sherie Don, NP  05/30/23  2:37 PM  Electrophysiology [facility]

## 2023-05-30 NOTE — Telephone Encounter (Signed)
Patient's spouse called in and stated that the patient stopped the lasix and amlodipine as instructed by provider 05/27/23 but the swelling has gotten worse. Patient's spouse insisted that the patient be seen to assess and evaluate the current edema has he stated has "gotten worse." Patient is scheduled today Monday 05/30/23 1:30 PM.

## 2023-05-30 NOTE — Patient Instructions (Addendum)
Medication Instructions:   Your physician recommends that you continue on your current medications as directed. Please refer to the Current Medication list given to you today.  *If you need a refill on your cardiac medications before your next appointment, please call your pharmacy*   Lab Work:  Your physician recommends that you have lab work: TODAY  CMP/CBC  If you have labs (blood work) drawn today and your tests are completely normal, you will receive your results only by: MyChart Message (if you have MyChart) OR A paper copy in the mail If you have any lab test that is abnormal or we need to change your treatment, we will call you to review the results.   Testing/Procedures:  Your physician has requested that you have an limited echocardiogram. Echocardiography is a painless test that uses sound waves to create images of your heart. It provides your doctor with information about the size and shape of your heart and how well your heart's chambers and valves are working. This procedure takes approximately one hour. There are no restrictions for this procedure. Please do NOT wear cologne, perfume, aftershave, or lotions (deodorant is allowed). Please arrive 15 minutes prior to your appointment time.    Follow-Up: At [facility], you and your health needs are our priority.  As part of our continuing mission to provide you with exceptional heart care, we have created designated Provider Care Teams.  These Care Teams include your primary Cardiologist (physician) and Advanced Practice Providers (APPs -  Physician Assistants and Nurse Practitioners) who all work together to provide you with the care you need, when you need it.  We recommend signing up for the patient portal called "MyChart".  Sign up information is provided on this After Visit Summary.  MyChart is used to connect with patients for Virtual Visits (Telemedicine).  Patients are able to view lab/test results,  encounter notes, upcoming appointments, etc.  Non-urgent messages can be sent to your provider as well.   To learn more about what you can do with MyChart, go to [email or web address].    Your next appointment:    Follow up to be determined on testing results   Provider:   Sherie Don, NP    Other Instructions  Our office will contact you with test results and instructions/follow up.

## 2023-05-30 NOTE — Telephone Encounter (Signed)
Pt c/o swelling: STAT is pt has developed SOB within 24 hours  If swelling, where is the swelling located? Legs  How much weight have you gained and in what time span? 7-9 lbs  Have you gained 3 pounds in a day or 5 pounds in a week? Yes   Do you have a log of your daily weights (if so, list)? Yes   Are you currently taking a fluid pill? No; was taken off fluid pill  Are you currently SOB? Yes   Have you traveled recently? No

## 2023-05-30 NOTE — Addendum Note (Signed)
Addended by: Kendrick Fries on: 05/30/2023 02:21 PM   Modules accepted: Orders

## 2023-05-31 ENCOUNTER — Telehealth: Payer: Self-pay | Admitting: Cardiology

## 2023-05-31 MED ORDER — FUROSEMIDE 20 MG PO TABS: ORAL_TABLET | ORAL | 0 refills | Status: DC

## 2023-05-31 NOTE — Telephone Encounter (Signed)
I spoke to patient's spouse on the phone, who is very HOH.  Reviewed lab results and recommendation to take 20mg  lasix x 5 days. Refill sent to [facility]

## 2023-06-01 ENCOUNTER — Telehealth: Payer: Self-pay | Admitting: Cardiovascular Disease

## 2023-06-01 DIAGNOSIS — Z638 Other specified problems related to primary support group: Secondary | ICD-10-CM

## 2023-06-01 DIAGNOSIS — Z7189 Other specified counseling: Secondary | ICD-10-CM

## 2023-06-01 DIAGNOSIS — Z9189 Other specified personal risk factors, not elsewhere classified: Secondary | ICD-10-CM

## 2023-06-01 NOTE — Telephone Encounter (Signed)
Pt c/o Shortness Of Breath: STAT if SOB developed within the last 24 hours or pt is noticeably SOB on the phone  1. Are you currently SOB (can you hear that pt is SOB on the phone)? Yes   2. How long have you been experiencing SOB?   3. Are you SOB when sitting or when up moving around?   4. Are you currently experiencing any other symptoms? Swelling. Patient's husband who is HOH is calling for his wife who he says is now having issues with SOB and more fluid retention. He would like to speak with someone before taking his wife to the ED.

## 2023-06-01 NOTE — Telephone Encounter (Signed)
Spoke with patient and husband together. They both state she is not SOB. She started the furosemide today. Her weight is 172 lbs. She is keeping her legs elevated but is unable to use compression hose because they were causing her legs to bleed. He is worried that the furosemide won't help her but is going to continue giving it to her for 5 days as advised. He states he will call back if he doesn't see improvement after taking as directed. He expressed gratitude for the call.

## 2023-06-01 NOTE — Telephone Encounter (Signed)
Agree with recommendations. I spoke to patient's spouse yesterday and he was ok with trailing lasix x 5 days.  There was no mention of SOB during our visit earlier this week or during phone call yesterday.  Let's also refer to social work team for additional assistance

## 2023-06-01 NOTE — Telephone Encounter (Signed)
The patient's husband called in with concerns about increased fluid. According to the telephone call from yesterday, the patient was due to start Furosemide 20 mg for 5 days. The spouse stated that the patient's first dose was this morning.   He stated that he was concerned that this was not going to work for her and is concerned about fluid on her lungs. He stated that when they tried to wear the compression hose that her legs started to bleed. He has been advised that due to his concerns, it would be best if she was seen in the ED. The husband is very hard of hearing but did repeat back what the advise was and verbalized his understanding.

## 2023-06-02 ENCOUNTER — Encounter: Payer: Self-pay | Admitting: Podiatry

## 2023-06-02 ENCOUNTER — Ambulatory Visit: Payer: Medicare Other | Admitting: Podiatry

## 2023-06-02 VITALS — BP 157/76 | HR 55

## 2023-06-02 DIAGNOSIS — E119 Type 2 diabetes mellitus without complications: Secondary | ICD-10-CM | POA: Diagnosis not present

## 2023-06-02 DIAGNOSIS — B351 Tinea unguium: Secondary | ICD-10-CM

## 2023-06-02 DIAGNOSIS — M79675 Pain in left toe(s): Secondary | ICD-10-CM | POA: Diagnosis not present

## 2023-06-02 DIAGNOSIS — Q828 Other specified congenital malformations of skin: Secondary | ICD-10-CM | POA: Diagnosis not present

## 2023-06-02 DIAGNOSIS — D689 Coagulation defect, unspecified: Secondary | ICD-10-CM

## 2023-06-02 DIAGNOSIS — M79674 Pain in right toe(s): Secondary | ICD-10-CM

## 2023-06-02 NOTE — Progress Notes (Signed)
Subjective:  Patient ID: [name] [name], female    DOB: 12/10/1936,  MRN: [id number]  [name] [name] presents to clinic today for painful porokeratotic lesion(s) right lower extremity and painful mycotic toenails that limit ambulation. Painful toenails interfere with ambulation. Aggravating factors include wearing enclosed shoe gear. Pain is relieved with periodic professional debridement. Painful porokeratotic lesions are aggravated when weightbearing with and without shoegear. Pain is relieved with periodic professional debridement. Chief Complaint  Patient presents with   Nail Problem    "Trim my toenails."    New problem(s): None.   PCP is Sherlene Shams, MD.  Allergies  Allergen Reactions   Cyclobenzaprine Other (See Comments)    Dropped blood pressure and heart rate, "talking out of my mind"   Iodine Anaphylaxis    NO PROBLEMS WITH BETADINE   Amiodarone     Cannot remember    Biaxin [Clarithromycin] Nausea And Vomiting   Codeine Nausea And Vomiting   Digoxin And Related Other (See Comments)    Fatigue, eye puffiness, hoarsness   Enalapril Other (See Comments)    unknown   Famotidine     Caused dizziness   Fluocinonide Other (See Comments)    Tingling sensation in head and redness to scalp.   Iodinated Contrast Media Other (See Comments)    Tachycardia   Iodine-131    Losartan Potassium Other (See Comments)    Dizzy and headache   Omnicef [Cefdinir]    Oxycodone Nausea And Vomiting   Promethazine Other (See Comments)    Unknown    Sertraline     Dizziness and weakness   Tizanidine Hcl Other (See Comments)    hypotension    Warfarin And Related     Bleeding    Xarelto [Rivaroxaban] Other (See Comments)    bleeding    Review of Systems: Negative except as noted in the HPI. Objective:   Vitals:   06/02/23 1037  BP: (!) 157/76  Pulse: (!) 55   Constitutional [name] [name] is a pleasant 87 y.o. female, WD, WN in NAD. AAO x 3.   Vascular Vascular  Examination: Capillary refill time immediate b/l. Vascular status intact b/l with palpable pedal pulses. Pedal hair absent. Trace edema b/l. No pain with calf compression b/l. Skin temperature gradient WNL b/l. No cyanosis or clubbing b/l.   Neurological Examination: Sensation grossly intact b/l with 10 gram monofilament. Vibratory sensation intact b/l.   Dermatological Examination: Pedal skin with normal turgor, texture and tone b/l.  No open wounds. No interdigital macerations.   Toenails 1-5 b/l thick, discolored, elongated with subungual debris and pain on dorsal palpation.   Porokeratotic lesion(s) submet head 3 right foot. No erythema, no edema, no drainage, no fluctuance.  Musculoskeletal Examination: Muscle strength 5/5 to all lower extremity muscle groups bilaterally. Plantarflexed metatarsal(s) 4th metatarsal head right lower extremity.  Radiographs: None  Last A1c:      Latest Ref Rng & Units 12/29/2022    3:15 PM  Hemoglobin A1C  Hemoglobin-A1c 4.6 - 6.5 % 6.5      Assessment:   1. Pain due to onychomycosis of toenails of both feet   2. Porokeratosis   3. Coagulation defect (HCC)   4. Diet-controlled diabetes mellitus (HCC)    Plan:  Patient was evaluated and treated and all questions answered. Consent given for treatment as described below: -Patient to continue soft, supportive shoe gear daily. -Toenails 1-5 b/l were debrided in length and girth with sterile nail nippers and dremel  without iatrogenic bleeding.  -Porokeratotic lesion(s) submet head 3 right foot pared and enucleated with sterile currette without incident. Total number of lesions debrided=1. -Patient/POA to call should there be question/concern in the interim.  Return in about 3 months (around 09/02/2023).  Freddie Breech, DPM

## 2023-06-03 ENCOUNTER — Ambulatory Visit (INDEPENDENT_AMBULATORY_CARE_PROVIDER_SITE_OTHER): Payer: Medicare Other | Admitting: Nurse Practitioner

## 2023-06-03 ENCOUNTER — Telehealth: Payer: Self-pay | Admitting: Licensed Clinical Social Worker

## 2023-06-03 ENCOUNTER — Encounter: Payer: Self-pay | Admitting: Nurse Practitioner

## 2023-06-03 VITALS — BP 130/86 | HR 60 | Temp 97.7°F | Ht 64.0 in | Wt 171.4 lb

## 2023-06-03 DIAGNOSIS — M542 Cervicalgia: Secondary | ICD-10-CM

## 2023-06-03 MED ORDER — METHOCARBAMOL 500 MG PO TABS: 500.0000 mg | ORAL_TABLET | Freq: Every day | ORAL | 0 refills | Status: DC

## 2023-06-03 NOTE — Progress Notes (Signed)
Established Patient Office Visit  Subjective:  Patient ID: [name] [name], female    DOB: 1936/11/25  Age: 87 y.o. MRN: [id number]  CC:  Chief Complaint  Patient presents with   Neck Pain    2 weeks back of neck in middle muscles tight as well keeps her up at night    HPI  [name] [name] presents for neck pain going on for 2 week.  She has  Neck Pain  This is a new problem. The current episode started 1 to 4 weeks ago. The problem occurs constantly. The problem has been unchanged. The pain is associated with nothing. The pain is present in the midline. The quality of the pain is described as aching. Nothing aggravates the symptoms. The pain is Worse during the night. Stiffness is present In the morning. She has tried heat for the symptoms.     Past Medical History:  Diagnosis Date   Actinic keratosis    Arthritis    Atrial flutter (HCC) 02/2011   s/p cardioversion    Chest pain    a. H/o cardiac cath x 2-> 2012 -->nl cors;  b. 12/2016 MV: EF 61%, small region of mild perfusion defect in the apical anteroseptal region c/w breast attenuation, no ischemia-->Low risk; c. 06/2019 MV: Mod size, mild inflat ischemia, EF 59%. Cor and Ao Ca2+. Inflat defect more pronounced on this study compared to last; c. 07/2019 Cath: LM nl, LAD min irregs, LCX nl, OM1/2/3 nl, RCA min irregs.   CKD (chronic kidney disease), stage III (HCC)    Concussion with no loss of consciousness 09/01/2016   Cystocele    Degenerative disorder of bone    GERD (gastroesophageal reflux disease)    Headache(784.0)    chronic   Hiatal hernia    Hyperlipidemia    Hypertension    Influenza 01/10/2017   Knee fracture    Migraines    Mobitz type 2 second degree atrioventricular block    a. felt to be 2/2 amiodarone, resolved with decreased amiodarone dose.  Amio since d/c'd.   Permanent atrial fibrillation (HCC)    a. status post multiple DCCVs; b. 2018 - eval for PVI but opted for rate control.   PONV  (postoperative nausea and vomiting)    oxycodone and codiene cause N/V    Pre-syncope    a. In setting of dehydration and AKI in the past.   Sleep apnea    Squamous cell carcinoma of skin 11/10/2020   left distal posterior deltoid (EDC 01/15/2021)   Tachycardia induced cardiomyopathy (HCC)    a. Resolved;  b. 08/2017 Echo: EF 50-55%, no rwma, mild MR, mildly to mod dil LA/RA; c. 02/2018 Echo: EF 55-60%, mild MR. Mildly dil LA. Nl RVSP. PASP .   Venous insufficiency    Vertigo     Past Surgical History:  Procedure Laterality Date   ABDOMINAL HYSTERECTOMY  1990   APPENDECTOMY     AUGMENTATION MAMMAPLASTY Bilateral 1986   implants   AUGMENTATION MAMMAPLASTY  1990   AUGMENTATION MAMMAPLASTY  2011   CARDIAC CATHETERIZATION     CARDIOVERSION     x 3   CARDIOVERSION     CARDIOVERSION N/A 02/07/2017   Procedure: CARDIOVERSION;  Surgeon: Iran Ouch, MD;  Location: [facility];  Service: Cardiovascular;  Laterality: N/A;   CARDIOVERSION N/A 07/22/2017   Procedure: Cardioversion;  Surgeon: Antonieta Iba, MD;  Location: [facility];  Service: Cardiovascular;  Laterality: N/A;   CATARACT EXTRACTION W/PHACO  Right 09/21/2016   Procedure: CATARACT EXTRACTION PHACO AND INTRAOCULAR LENS PLACEMENT (IOC);  Surgeon: Galen Manila, MD;  Location: [facility];  Service: Ophthalmology;  Laterality: Right;  [place] 44.1 AP% 16.5 CDE 7.30 Fluid Pack Lot #[id number]   CATARACT EXTRACTION W/PHACO Left 10/19/2016   Procedure: CATARACT EXTRACTION PHACO AND INTRAOCULAR LENS PLACEMENT (IOC);  Surgeon: Galen Manila, MD;  Location: [facility];  Service: Ophthalmology;  Laterality: Left;  [place] 53.7 AP% 19.5 CDE 10.45 Fluid pack lot # [id number]   CHOLECYSTECTOMY     COMBINED AUGMENTATION MAMMAPLASTY AND ABDOMINOPLASTY     JOINT REPLACEMENT Left 06/04/2013   left knee   KNEE ARTHROSCOPY Right 08/16/2016   Procedure: ARTHROSCOPY KNEE, tear posterior horn medial meniscus, tear anterior and posterior horns of lateral  meniscus, chondromalacia of lateral compartment grade 3 patella and grade 4 medial;  Surgeon: Donato Heinz, MD;  Location: [facility];  Service: Orthopedics;  Laterality: Right;   LEFT HEART CATH AND CORONARY ANGIOGRAPHY Left 07/27/2019   Procedure: LEFT HEART CATH AND CORONARY ANGIOGRAPHY;  Surgeon: Iran Ouch, MD;  Location: [facility];  Service: Cardiovascular;  Laterality: Left;   MASTECTOMY  1986   nipple sparing mastectomy/Bilateral with silicone  breast implants, s/p saline replacements   Multiple orthopedic procedures     NOSE SURGERY     PACEMAKER IMPLANT N/A 04/13/2023   Procedure: PACEMAKER IMPLANT;  Surgeon: Duke Salvia, MD;  Location: [facility];  Service: Cardiovascular;  Laterality: N/A;   TEE WITHOUT CARDIOVERSION N/A 09/26/2017   Procedure: TRANSESOPHAGEAL ECHOCARDIOGRAM (TEE);  Surgeon: Pricilla Riffle, MD;  Location: [facility]OS[facility];  Service: Cardiovascular;  Laterality: N/A;   TOTAL KNEE ARTHROPLASTY Left     Family History  Problem Relation Age of Onset   Heart disease Mother    Stomach cancer Father    Breast cancer Sister 7   Breast cancer Sister 48   Breast cancer Sister 32   Breast cancer Sister    Breast cancer Maternal Grandmother    Diabetes Brother    Esophageal cancer Brother    Kidney failure Brother    Heart disease Son        found at autopsy   Ovarian cancer Neg Hx     Social History   Socioeconomic History   Marital status: Married    Spouse name: Not on file   Number of children: 1   Years of education: Not on file   Highest education level: Not on file  Occupational History    Employer: RETIRED  Tobacco Use   Smoking status: Never   Smokeless tobacco: Never  Vaping Use   Vaping Use: Never used  Substance and Sexual Activity   Alcohol use: No   Drug use: No   Sexual activity: Never    Birth control/protection: Surgical  Other Topics Concern   Not on file  Social History Narrative   Married    Social  Determinants of Health   Financial Resource Strain: Low Risk  (05/06/2022)   Overall Financial Resource Strain (CARDIA)    Difficulty of Paying Living Expenses: Not hard at all  Food Insecurity: No Food Insecurity (04/15/2023)   Hunger Vital Sign    Worried About Running Out of Food in the Last Year: Never true    Ran Out of Food in the Last Year: Never true  Transportation Needs: No Transportation Needs (04/15/2023)   PRAPARE - Administrator, Civil Service (Medical): No  Lack of Transportation (Non-Medical): No  Physical Activity: Insufficiently Active (05/06/2022)   Exercise Vital Sign    Days of Exercise per Week: 3 days    Minutes of Exercise per Session: 20 min  Stress: No Stress Concern Present (05/06/2022)   [employer] of Occupational Health - Occupational Stress Questionnaire    Feeling of Stress : Not at all  Social Connections: Unknown (05/06/2022)   Social Connection and Isolation Panel [NHANES]    Frequency of Communication with Friends and Family: Not on file    Frequency of Social Gatherings with Friends and Family: Not on file    Attends Religious Services: Not on file    Active Member of Clubs or Organizations: Not on file    Attends Banker Meetings: Not on file    Marital Status: Married  Intimate Partner Violence: Not At Risk (04/08/2023)   Humiliation, Afraid, Rape, and Kick questionnaire    Fear of Current or Ex-Partner: No    Emotionally Abused: No    Physically Abused: No    Sexually Abused: No     Outpatient Medications Prior to Visit  Medication Sig Dispense Refill   acetaminophen (TYLENOL) 500 MG tablet Take 1,000 mg by mouth every 6 (six) hours as needed (pain).      aluminum-magnesium hydroxide 200-200 MG/5ML suspension Take 20 mLs by mouth every 6 (six) hours as needed for indigestion.      amoxicillin (AMOXIL) 500 MG capsule Take 500 mg by mouth every 6 (six) hours.     apixaban (ELIQUIS) 5 MG TABS tablet TAKE ONE  TABLET TWICE DAILY 60 tablet 5   Carboxymethylcellul-Glycerin (LUBRICATING EYE DROPS OP) Place 1 drop into both eyes daily as needed (dry eyes).     cholecalciferol (VITAMIN D) 1000 units tablet Take 1,000 Units by mouth daily.     Coenzyme Q10 (COQ10) 200 MG CAPS Take 200 mg by mouth daily.     metoprolol tartrate (LOPRESSOR) 25 MG tablet Take 0.5 tablets (12.5 mg total) by mouth 2 (two) times daily. 90 tablet 1   pantoprazole (PROTONIX) 40 MG tablet TAKE 1 TABLET BY MOUTH DAILY 90 tablet 1   pravastatin (PRAVACHOL) 20 MG tablet TAKE 1 TABLET BY MOUTH DAILY 90 tablet 0   Probiotic Product (PROBIOTIC ADVANCED PO) Take 1 tablet by mouth daily.     sodium chloride (OCEAN) 0.65 % SOLN nasal spray Place 1 spray into both nostrils daily as needed for congestion.     amLODipine (NORVASC) 2.5 MG tablet Take 1 tablet (2.5 mg total) by mouth daily. 90 tablet 3   furosemide (LASIX) 20 MG tablet Take 1 tablet daily for 5 days, Then take 1 tablet by mouth daily as needed for weight gain > 165 lbs. 30 tablet 0   No facility-administered medications prior to visit.    Allergies  Allergen Reactions   Cyclobenzaprine Other (See Comments)    Dropped blood pressure and heart rate, "talking out of my mind"   Iodine Anaphylaxis    NO PROBLEMS WITH BETADINE   Amiodarone     Cannot remember    Biaxin [Clarithromycin] Nausea And Vomiting   Codeine Nausea And Vomiting   Digoxin And Related Other (See Comments)    Fatigue, eye puffiness, hoarsness   Enalapril Other (See Comments)    unknown   Famotidine     Caused dizziness   Fluocinonide Other (See Comments)    Tingling sensation in head and redness to scalp.   Iodinated Contrast Media  Other (See Comments)    Tachycardia   Iodine-131    Losartan Potassium Other (See Comments)    Dizzy and headache   Omnicef [Cefdinir]    Oxycodone Nausea And Vomiting   Promethazine Other (See Comments)    Unknown    Sertraline     Dizziness and weakness    Tizanidine Hcl Other (See Comments)    hypotension    Warfarin And Related     Bleeding    Xarelto [Rivaroxaban] Other (See Comments)    bleeding    ROS Review of Systems  Constitutional: Negative.   Eyes: Negative.   Respiratory: Negative.    Musculoskeletal:  Positive for neck pain.  Neurological: Negative.   Psychiatric/Behavioral: Negative.     Negative unless indicated in HPI.    Objective:    Physical Exam Constitutional:      Appearance: Normal appearance.  Cardiovascular:     Rate and Rhythm: Normal rate and regular rhythm.     Pulses: Normal pulses.     Heart sounds: Normal heart sounds.  Pulmonary:     Effort: Pulmonary effort is normal.     Breath sounds: Normal breath sounds. No wheezing.  Musculoskeletal:     Cervical back: Tenderness present.  Neurological:     General: No focal deficit present.     Mental Status: She is alert and oriented to person, place, and time. Mental status is at baseline.  Psychiatric:        Mood and Affect: Mood normal.        Behavior: Behavior normal.        Thought Content: Thought content normal.        Judgment: Judgment normal.     BP 130/86   Pulse 60   Temp 97.7 F (36.5 C) (Oral)   Ht 5\' 4"  (1.626 m)   Wt 171 lb 6.4 oz (77.7 kg)   SpO2 96%   BMI 29.42 kg/m  Wt Readings from Last 3 Encounters:  06/07/23 173 lb 3.2 oz (78.6 kg)  06/07/23 173 lb 6.4 oz (78.7 kg)  06/03/23 171 lb 6.4 oz (77.7 kg)     Health Maintenance  Topic Date Due   OPHTHALMOLOGY EXAM  Never done   COVID-19 Vaccine (4 - 2023-24 season) 08/20/2022   Zoster Vaccines- Shingrix (2 of 2) 01/31/2023   Medicare Annual Wellness (AWV)  05/07/2023   HEMOGLOBIN A1C  06/29/2023   INFLUENZA VACCINE  07/21/2023   FOOT EXAM  12/30/2023   Diabetic kidney evaluation - Urine ACR  04/28/2024   Diabetic kidney evaluation - eGFR measurement  05/29/2024   DTaP/Tdap/Td (4 - Td or Tdap) 11/30/2024   Pneumonia Vaccine 39+ Years old  Completed   DEXA  SCAN  Completed   HPV VACCINES  Aged Out    There are no preventive care reminders to display for this patient.  Lab Results  Component Value Date   TSH 0.971 04/08/2023   Lab Results  Component Value Date   WBC 5.9 05/30/2023   HGB 11.7 (L) 05/30/2023   HCT 37.1 05/30/2023   MCV 97.6 05/30/2023   PLT 243 05/30/2023   Lab Results  Component Value Date   NA 133 (L) 05/30/2023   K 3.9 05/30/2023   CO2 25 05/30/2023   GLUCOSE 206 (H) 05/30/2023   BUN 14 05/30/2023   CREATININE 0.85 05/30/2023   BILITOT 2.0 (H) 05/30/2023   ALKPHOS 100 05/30/2023   AST 32 05/30/2023   ALT 25 05/30/2023  PROT 7.0 05/30/2023   ALBUMIN 3.9 05/30/2023   CALCIUM 8.8 (L) 05/30/2023   ANIONGAP 8 05/30/2023   GFR 78.94 04/29/2023   Lab Results  Component Value Date   CHOL 149 12/29/2022   Lab Results  Component Value Date   HDL 66.10 12/29/2022   Lab Results  Component Value Date   LDLCALC 70 12/29/2022   Lab Results  Component Value Date   TRIG 65.0 12/29/2022   Lab Results  Component Value Date   CHOLHDL 2 12/29/2022   Lab Results  Component Value Date   HGBA1C 6.5 12/29/2022      Assessment & Plan:  Neck pain Assessment & Plan: Advised patient to do massage, stretching, using Voltaren gel and heat compress. Robaxin sent to the pharmacy, advised patient to take it at bedtime. Referral for PT has been sent to [facility] physical therapy. If symptoms not improving call the office for further e  Orders: -     Ambulatory referral to Physical Therapy  Other orders -     Methocarbamol; Take 1 tablet (500 mg total) by mouth at bedtime.  Dispense: 20 tablet; Refill: 0    Follow-up: No follow-ups on file.   Kara Dies, NP

## 2023-06-03 NOTE — Progress Notes (Signed)
Heart and Vascular Care Navigation  06/03/2023  KONNI LIPSCHUTZ [birth year] [id number]  Reason for Referral: caregiving concerns Patient is participating in a Managed Medicaid Plan: No, UHC Medicare only  Engaged with patient by telephone (pt husband answered home number) for initial visit for Heart and Vascular Care Coordination.                                                                                                   Assessment:         LCSW  reached pt spouse this afternoon at [phone]. Introduced self, role, reason for call. Pt spouse limited by hearing challenges but when I spoke loudly and slowly was able to complete short assessment. Confirmed home address, PCP (spouse shares pt has appt this afternoon), and emergency contact Allyson Sabal (son) who lives a few houses down from them. Pt spouse states he provides all of pt care. They do not have any in home assistance, is not clear about how much assistance pt family provides. When I asked pt spouse if he had ever considered home care assistance or respite he states he "doesn't know what they would need that for." LCSW shares that it may be helpful to have someone come and provide them with assistance around the house/some hands on care so that he can get a break/run errands. He states he takes pt along with him everywhere and doesn't think that would be needed.   I inquired if I could send him resources for in home services/community resources and he tentatively says yes. I then inquired if I could call Allyson Sabal (on contact list) or [place]indi (on [place]). Pt spouse is very avoidant about this stating pt son "babysits" and "has his own things going on." LCSW requested for me to at least call and share that we had sent resources. Pt spouse gives me permission to call Allyson Sabal to discuss this. He then finished the call stating I was keeping him from making lunch. Thanked pt spouse for his time, I have relayed this information to provider that made referral.                      HRT/VAS Care Coordination     Patients Home Cardiology Office [facility]   Outpatient Care Team Social Worker   Social Worker Name: Octavio Graves, [place], [phone]   Living arrangements for the past 2 months Single Family Home   Lives with: Spouse   Patient Current Insurance Coverage Managed Medicare   Patient Has Concern With Paying Medical Bills No   Does Patient Have Prescription Coverage? Yes   Home Assistive Devices/Equipment Eyeglasses       Social History:  SDOH Screenings   Food Insecurity: No Food Insecurity (04/15/2023)  Housing: Low Risk  (04/15/2023)  Transportation Needs: No Transportation Needs (04/15/2023)  Utilities: Not At Risk (04/08/2023)  Depression (PHQ2-9): High Risk (06/03/2023)  Financial Resource Strain: Low Risk  (05/06/2022)  Physical Activity: Insufficiently Active (05/06/2022)  Social Connections: Unknown (05/06/2022)  Stress: No Stress Concern Present (05/06/2022)  Tobacco Use: Low Risk  (06/03/2023)      Other Care Navigation Interventions:     Patient Referred to: Sent my card and [employer] packet   Follow-up plan:   LCSW attempted to call pt son Allyson Sabal to see if he has any additional information/interest in particular resources. No answer, left voicemail will re-attempt. I sent pt/pt husband a packet of information from [employer] who can assist family with connecting to resources such as in home care and respite. I will f/u to see if resources receive.

## 2023-06-03 NOTE — Patient Instructions (Addendum)
Robaxin at bed time. Bengay/voltern gel, heat and stretching.  Referral sent to physical therapy. [facility] therapy [address] [place], [place] [address] Phone: [phone] Fax: [phone]

## 2023-06-03 NOTE — Addendum Note (Signed)
Addended by: Thayer Headings, Tasha Diaz L on: 06/03/2023 08:21 AM   Modules accepted: Orders

## 2023-06-06 ENCOUNTER — Telehealth: Payer: Self-pay | Admitting: Cardiovascular Disease

## 2023-06-06 ENCOUNTER — Telehealth: Payer: Self-pay

## 2023-06-06 ENCOUNTER — Ambulatory Visit: Payer: Medicare Other | Attending: Cardiology

## 2023-06-06 DIAGNOSIS — R0602 Shortness of breath: Secondary | ICD-10-CM

## 2023-06-06 DIAGNOSIS — R600 Localized edema: Secondary | ICD-10-CM | POA: Diagnosis not present

## 2023-06-06 LAB — ECHOCARDIOGRAM LIMITED

## 2023-06-06 NOTE — Telephone Encounter (Signed)
Pt c/o swelling: STAT is pt has developed SOB within 24 hours  If swelling, where is the swelling located? Legs from the knees down   How much weight have you gained and in what time span? Not sure   Have you gained 3 pounds in a day or 5 pounds in a week? Not sure   Do you have a log of your daily weights (if so, list)? Not sure   Are you currently taking a fluid pill? Yes   Are you currently SOB? No   Have you traveled recently?  No   Pt called in stating she has been swelling in her legs. Her husband was on the phone as well stating they wanted to be seen today and Informed them that there are no OV today available after or before her echo, but I will forward to nurse to advise what pt should do about swelling.

## 2023-06-06 NOTE — Telephone Encounter (Signed)
Pacemaker alert received. 1 HVR classified episode on 06/03/23 at 06:55 of 40 min 57 sec duration. EGM shows irregular R-R intervals, avg V rate 176 bpm.  Hx AFL, on Eliquis per Epic.  Routed to clinic for review.  Follow up as scheduled.   MC, CVRS.  Patient also having acute heart failure sxs with fluid retention. She was confused and could not understand my questions regarding any sx's during event on Friday. Denies any current sx's, sees [facility] in [place]. Will flag for both providers.  See phone notes today documented by Dr. Jari Sportsman office.     40 minute EPISODE: HVR, ? AF/Aflutter with RVR

## 2023-06-06 NOTE — Telephone Encounter (Signed)
Went into the lobby to speak with the patient and husband after echo was performed. Patient was not in any distress. Her and her husband deny that she is SOB however she does have BLLE edema. Husband was very insistent on having her seen by a provider but declined going to the ED. Scheduled first available appointment with [facility] tomorrow morning at 8:25 AM to discuss echo results and further treatment.

## 2023-06-06 NOTE — Telephone Encounter (Signed)
[name] [name]: Limited Echo completed for for SOB. Clinical significant change in TR and RV size. prior echo 04/08/23 TR is now severe, IVC does not collapse, RV is moderately enlarged.    21 mins You were added by Sherie Don, NP. 16 mins Sherie Don, NP two options for patient:  1 - torsemide 20mg  BID x 5 days, then 20mg  daily and see me in clinic next week. Also needs K supplement with torsemide  option 2 - go to ER for closer monitoring and IV diuretic  whichever husband feels more comfortable with is fine with me  16 mins SR Rielly Brunn - can you please call [name] when you get a moment

## 2023-06-06 NOTE — Telephone Encounter (Signed)
Error

## 2023-06-07 ENCOUNTER — Telehealth: Payer: Self-pay | Admitting: Licensed Clinical Social Worker

## 2023-06-07 ENCOUNTER — Encounter: Payer: Self-pay | Admitting: Medical

## 2023-06-07 ENCOUNTER — Encounter: Payer: Self-pay | Admitting: Internal Medicine

## 2023-06-07 ENCOUNTER — Ambulatory Visit (INDEPENDENT_AMBULATORY_CARE_PROVIDER_SITE_OTHER): Payer: Medicare Other | Admitting: Internal Medicine

## 2023-06-07 ENCOUNTER — Telehealth: Payer: Self-pay | Admitting: Cardiovascular Disease

## 2023-06-07 ENCOUNTER — Ambulatory Visit: Payer: Medicare Other | Attending: Medical | Admitting: Medical

## 2023-06-07 VITALS — BP 142/68 | HR 57 | Ht 64.0 in | Wt 173.4 lb

## 2023-06-07 VITALS — BP 142/80 | HR 64 | Temp 98.4°F | Ht 64.0 in | Wt 173.2 lb

## 2023-06-07 DIAGNOSIS — R4189 Other symptoms and signs involving cognitive functions and awareness: Secondary | ICD-10-CM | POA: Diagnosis not present

## 2023-06-07 DIAGNOSIS — F419 Anxiety disorder, unspecified: Secondary | ICD-10-CM

## 2023-06-07 DIAGNOSIS — I429 Cardiomyopathy, unspecified: Secondary | ICD-10-CM | POA: Diagnosis not present

## 2023-06-07 DIAGNOSIS — I4821 Permanent atrial fibrillation: Secondary | ICD-10-CM | POA: Diagnosis not present

## 2023-06-07 DIAGNOSIS — I10 Essential (primary) hypertension: Secondary | ICD-10-CM

## 2023-06-07 DIAGNOSIS — I700 Atherosclerosis of aorta: Secondary | ICD-10-CM | POA: Diagnosis not present

## 2023-06-07 DIAGNOSIS — I509 Heart failure, unspecified: Secondary | ICD-10-CM | POA: Diagnosis not present

## 2023-06-07 DIAGNOSIS — R600 Localized edema: Secondary | ICD-10-CM

## 2023-06-07 DIAGNOSIS — R0602 Shortness of breath: Secondary | ICD-10-CM | POA: Diagnosis not present

## 2023-06-07 DIAGNOSIS — F32A Depression, unspecified: Secondary | ICD-10-CM

## 2023-06-07 DIAGNOSIS — I5033 Acute on chronic diastolic (congestive) heart failure: Secondary | ICD-10-CM

## 2023-06-07 DIAGNOSIS — I495 Sick sinus syndrome: Secondary | ICD-10-CM

## 2023-06-07 DIAGNOSIS — F0390 Unspecified dementia without behavioral disturbance: Secondary | ICD-10-CM | POA: Insufficient documentation

## 2023-06-07 MED ORDER — TORSEMIDE 20 MG PO TABS: 20.0000 mg | ORAL_TABLET | Freq: Every day | ORAL | 3 refills | Status: DC

## 2023-06-07 MED ORDER — ESCITALOPRAM OXALATE 10 MG PO TABS
10.0000 mg | ORAL_TABLET | Freq: Every day | ORAL | 2 refills | Status: DC
Start: 2023-06-07 — End: 2023-06-20

## 2023-06-07 MED ORDER — POTASSIUM CHLORIDE CRYS ER 20 MEQ PO TBCR: 20.0000 meq | EXTENDED_RELEASE_TABLET | Freq: Every day | ORAL | 3 refills | Status: DC

## 2023-06-07 NOTE — Assessment & Plan Note (Addendum)
With a 13 lb weight gain in the past month.  Seen by cardiology today,  torsemide started.  EF normal by yesterday's ECHO,  elevated right  sided pressures noted.

## 2023-06-07 NOTE — Assessment & Plan Note (Signed)
Likely with some underlying dementia based on MMSE score of 9.  MRI brain not possible due ot recent pacemaker placement.  RTC one month after inititiaion of lexapro

## 2023-06-07 NOTE — Progress Notes (Signed)
Subjective:  Patient ID: [name] [name], female    DOB: May 11, 1936  Age: 87 y.o. 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 Diagnoses of Aortic atherosclerosis (HCC), Essential hypertension, Anxiety and depression, and Congestive heart failure with cardiomyopathy (HCC) were also pertinent to this visit.   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  Returned to ER on May 1 because husband was concerned that her pulse was too slow at 50 bpm . Pacemaker was interrogated and found to be pacing about 20% of the time.  Husband h has been noticing decreased short term memory , increased confusion, and he has been managing her medications  . She returns today with [name]  for memory testing and screening for depression  She reports feeling depressed during the day due to the reduction in her daytime activities since her pacemaker implantation and the development of  bilateral lower extremity edema She has trouble articulating her emotions. Per [name] she has stopped watching TV,  no longer watches her favorite shows. [name] notes that she cries nearly daily ,  gets flustered by household tasks and taking longer to accomplish simple tasks. Having neck pain at night that is.   keeping her up.  NP Evelene Croon  prescribed her robaxin   for the neck spasms but the massage works better    LE edema  Seen today by cardiology and diagnosed with CHF , acute  based on exam and wight gain of 13 lbs since last visit. her diuretic was  changed to torsemide today,  legs are quite edematous and red.   Outpatient Medications Prior to Visit  Medication Sig Dispense Refill   acetaminophen (TYLENOL) 500 MG tablet Take 1,000 mg by mouth every 6 (six) hours as needed  (pain).      aluminum-magnesium hydroxide 200-200 MG/5ML suspension Take 20 mLs by mouth every 6 (six) hours as needed for indigestion.      amoxicillin (AMOXIL) 500 MG capsule Take 500 mg by mouth every 6 (six) hours.     apixaban (ELIQUIS) 5 MG TABS tablet TAKE ONE TABLET TWICE DAILY 60 tablet 5   Carboxymethylcellul-Glycerin (LUBRICATING EYE DROPS OP) Place 1 drop into both eyes daily as needed (dry eyes).     cholecalciferol (VITAMIN D) 1000 units tablet Take 1,000 Units by mouth daily.     Coenzyme Q10 (COQ10) 200 MG CAPS Take 200 mg by mouth daily.     methocarbamol (ROBAXIN) 500 MG tablet Take 1 tablet (500 mg total) by mouth at bedtime. 20 tablet 0   metoprolol tartrate (LOPRESSOR) 25 MG tablet Take 0.5 tablets (12.5 mg total) by mouth 2 (two) times daily. 90 tablet 1   pantoprazole (PROTONIX) 40 MG tablet TAKE 1 TABLET BY MOUTH DAILY 90 tablet 1   potassium chloride SA (KLOR-CON M20) 20 MEQ tablet Take 1 tablet (20 mEq total) by mouth daily. 90 tablet 3   pravastatin (PRAVACHOL) 20 MG tablet TAKE 1 TABLET BY MOUTH DAILY 90 tablet 0   Probiotic Product (PROBIOTIC ADVANCED PO) Take 1 tablet by mouth daily.     sodium chloride (OCEAN) 0.65 % SOLN nasal spray Place 1 spray into both nostrils daily as needed for congestion.  torsemide (DEMADEX) 20 MG tablet Take 1 tablet (20 mg total) by mouth daily. 90 tablet 3   No facility-administered medications prior to visit.    Review of Systems;  Patient denies headache, fevers, malaise, unintentional weight loss, skin rash, eye pain, sinus congestion and sinus pain, sore throat, dysphagia,  hemoptysis , cough, dyspnea, wheezing, chest pain, palpitations, orthopnea,  abdominal pain, nausea, melena, diarrhea, constipation, flank pain, dysuria, hematuria, urinary  Frequency, nocturia, numbness, tingling, seizures,  Focal weakness, Loss of consciousness,  Tremor, insomnia,  and suicidal ideation.      Objective:  BP (!) 142/80   Pulse 64    Temp 98.4 F (36.9 C) (Oral)   Ht 5\' 4"  (1.626 m)   Wt 173 lb 3.2 oz (78.6 kg)   SpO2 96%   BMI 29.73 kg/m   BP Readings from Last 3 Encounters:  06/07/23 (!) 142/80  06/07/23 (!) 142/68  06/03/23 130/86    Wt Readings from Last 3 Encounters:  06/07/23 173 lb 3.2 oz (78.6 kg)  06/07/23 173 lb 6.4 oz (78.7 kg)  06/03/23 171 lb 6.4 oz (77.7 kg)    Physical Exam Vitals reviewed.  Constitutional:      General: She is not in acute distress.    Appearance: Normal appearance. She is normal weight. She is not ill-appearing, toxic-appearing or diaphoretic.  HENT:     Head: Normocephalic.  Eyes:     General: No scleral icterus.       Right eye: No discharge.        Left eye: No discharge.     Conjunctiva/sclera: Conjunctivae normal.  Cardiovascular:     Rate and Rhythm: Normal rate and regular rhythm.     Heart sounds: Normal heart sounds.  Pulmonary:     Effort: Pulmonary effort is normal. No respiratory distress.     Breath sounds: Normal breath sounds.  Musculoskeletal:        General: Swelling present. Normal range of motion.  Skin:    General: Skin is warm and dry.     Findings: Erythema present.  Neurological:     General: No focal deficit present.     Mental Status: She is alert and oriented to person, place, and time. Mental status is at baseline.  Psychiatric:        Mood and Affect: Mood normal.        Behavior: Behavior normal.        Thought Content: Thought content normal.        Judgment: Judgment normal.   MMSE:  EXTREMELY LOW  (9/30).   Could not perform the  clock face assignment.    Lab Results  Component Value Date   HGBA1C 6.5 12/29/2022   HGBA1C 6.5 05/26/2022   HGBA1C 6.0 (A) 02/04/2021    Lab Results  Component Value Date   CREATININE 0.85 05/30/2023   CREATININE 0.84 04/30/2023   CREATININE 0.70 04/29/2023    Lab Results  Component Value Date   WBC 5.9 05/30/2023   HGB 11.7 (L) 05/30/2023   HCT 37.1 05/30/2023   PLT 243 05/30/2023    GLUCOSE 206 (H) 05/30/2023   CHOL 149 12/29/2022   TRIG 65.0 12/29/2022   HDL 66.10 12/29/2022   LDLDIRECT 72.0 12/29/2022   LDLCALC 70 12/29/2022   ALT 25 05/30/2023   AST 32 05/30/2023   NA 133 (L) 05/30/2023   K 3.9 05/30/2023   CL 100 05/30/2023   CREATININE 0.85 05/30/2023   BUN 14 05/30/2023  CO2 25 05/30/2023   TSH 0.971 04/08/2023   INR 1.3 02/12/2019   HGBA1C 6.5 12/29/2022   MICROALBUR <0.7 04/29/2023    No results found.  Assessment & Plan:  .Pseudodementia Assessment & Plan: Likely with some underlying dementia based on MMSE score of 9.  MRI brain not possible due ot recent pacemaker placement.  RTC one month after inititiaion of lexapro    Aortic atherosclerosis [facility]) Assessment & Plan: Reviewed findings of prior CT scan today..  Patient is tolerating pravastatin  And last LDL was 70.  No changes today   .   Essential hypertension Assessment & Plan: Home readings have been lower.  Diuretic was changed to torsemide today by cardiology    Anxiety and depression Assessment & Plan: Chronic anxiety largely due to worrying about her husband and other family members.  Prior trial of zoloft was not tolerated.  Trial of lexapro today    Congestive heart failure with cardiomyopathy [facility]) Assessment & Plan: With a 13 lb weight gain in the past month.  Seen by cardiology today,  torsemide started.     Other orders -     Escitalopram Oxalate; Take 1 tablet (10 mg total) by mouth daily.  Dispense: 30 tablet; Refill: 2     I provided 33 minutes of face-to-face time during this encounter reviewing patient's last visit with me, patient's  most recent visit with cardiology,  recent surgical and non surgical procedures, previous  labs and imaging studies, counseling on currently addressed issues,  and post visit ordering to diagnostics and therapeutics .   Follow-up: Return in about 4 weeks (around 07/05/2023).   Sherlene Shams, MD

## 2023-06-07 NOTE — Patient Instructions (Addendum)
I believe your memory will improve once we treat your anxiety/depression  Please start the Lexapro (escitalopram) at 1/2 tablet daily in the [facility] WITH BREAKFAST for the first few days to avoid nausea.  You can increase to a full tablet after one week  if you are tolerating it without side effects of nausea.  If the lexapro makes you sleepy,  take it in the evening after dinner  instead  Please return in  4 weeks ,

## 2023-06-07 NOTE — Telephone Encounter (Signed)
Patient's husband calling to ask for recommendation on best cream to relieve itching on legs. Recommended that he and the patient present to their nearest pharmacy ask the pharmacist what the best anti itch cream/lotion they could use to soothe the itching that she is experiencing. Patient's husband understood with read back.

## 2023-06-07 NOTE — Telephone Encounter (Signed)
Patient leg is real swollen, red, also it itch. Husband is calling to see what can be done. Please advise

## 2023-06-07 NOTE — Assessment & Plan Note (Signed)
Home readings have been lower.  Diuretic was changed to torsemide today by cardiology

## 2023-06-07 NOTE — Assessment & Plan Note (Signed)
Reviewed findings of prior CT scan today..  Patient is tolerating pravastatin  And last LDL was 70.  No changes today  

## 2023-06-07 NOTE — Assessment & Plan Note (Signed)
Chronic anxiety largely due to worrying about her husband and other family members.  Prior trial of zoloft was not tolerated.  Trial of lexapro today

## 2023-06-07 NOTE — Patient Instructions (Signed)
Medication Instructions:  Your physician recommends the following medication changes.  STOP TAKING: Lasix 20 mg daily  START TAKING: Torsemide 20 mg by mouth two times a day for 5 days, then decrease to 20 mg daily Tuesday- 1 tablet morning and evening Wednesday- 1 tablet morning and evening Thursday- 1 tablet morning and evening Friday- 1 tablet morning and evening Saturday- 1 tablet morning and evening Starting Sunday, take 1 tablet daily every morning Potassium 20 meq by mouth daily    Please pick up new prescription when you leave our office today   *If you need a refill on your cardiac medications before your next appointment, please call your pharmacy*   Lab Work: You will have labs completed at next office visit   Testing/Procedures: None ordered today   Follow-Up: At [facility], you and your health needs are our priority.  As part of our continuing mission to provide you with exceptional heart care, we have created designated Provider Care Teams.  These Care Teams include your primary Cardiologist (physician) and Advanced Practice Providers (APPs -  Physician Assistants and Nurse Practitioners) who all work together to provide you with the care you need, when you need it.  We recommend signing up for the patient portal called "MyChart".  Sign up information is provided on this After Visit Summary.  MyChart is used to connect with patients for Virtual Visits (Telemedicine).  Patients are able to view lab/test results, encounter notes, upcoming appointments, etc.  Non-urgent messages can be sent to your provider as well.   To learn more about what you can do with MyChart, go to [email or web address].    Your next appointment:   1 week(s)  Provider:   Terrilee Croak, PA-C

## 2023-06-07 NOTE — Telephone Encounter (Signed)
H&V Care Navigation CSW Progress Note  Clinical Social Worker contacted caregiver by phone (pt son [name] [name], pt husband gave verbal permission to call him) to f/u on resources sent to his father. Was able to reach him today at [phone]. Introduced self, role, reason for call. He confirms that he and his brother live down the road from his parents. He does not formally assist with medications or other care as his father is "stubborn." His brother is not available to provide care and his sister lives in [place]. Pt son states that his father manages their medications, transportation and he usually gets them meals from fast food, specifically regular [employer]. Without providing specifics I mentioned that this may not be the best option for regular meals given his mothers multiple cardiac needs that we follow her for. Encouraged him to check in on medication process and if he could assist with groceries or meal prep.    I explained that there is concern that his mothers care may be more than his father can handle, and I encouraged them as a family to consider what care would be needed should pt father not be able to provide care for pt or vice versa. I shared that I have sent care options and community resources from [employer] to his father to review. I also have mailed them to [name] [name]. No additional questions from pt son at this time. Encouraged him to reach out with pt/pt spouse if they need additional guidance.   Patient is participating in a Managed Medicaid Plan:  No, [employer] Medicare  SDOH Screenings   Food Insecurity: No Food Insecurity (04/15/2023)  Housing: Low Risk  (04/15/2023)  Transportation Needs: No Transportation Needs (04/15/2023)  Utilities: Not At Risk (04/08/2023)  Depression (PHQ2-9): High Risk (06/03/2023)  Financial Resource Strain: Low Risk  (05/06/2022)  Physical Activity: Insufficiently Active (05/06/2022)  Social Connections: Unknown (05/06/2022)  Stress: No Stress Concern  Present (05/06/2022)  Tobacco Use: Low Risk  (06/07/2023)   [name] [name], MSW, LCSW Clinical Social Worker II [facility] Heart/Vascular Care Navigation  [phone]- work cell phone (preferred) [phone]- desk phone

## 2023-06-07 NOTE — Progress Notes (Signed)
Cardiology Office Note:    Date:  06/07/2023   ID:  [name] [name], DOB December 04, 1936, MRN [id number]  PCP:  Sherlene Shams, MD  [facility] Cardiologist:  Lorine Bears, MD  [facility] Electrophysiologist:  Sherryl Manges, MD   Referring MD: Sherlene Shams, MD   Chief Complaint: lower leg edema  History of Present Illness:    [name] [name] is a 87 y.o. female with a hx of permanent Afib/flutter, HTN, tachy-brady s/p PPM, dementia who presents for follow-up.   She has a history of aifb with multiple cardioversion. Now she is rate controlled. She has a history of tachy-induced CM, but this resolved.   Most recent cath in 2020 showed minimal irregularities with no evidence of obstructive CAD, normal LVEF and mildly elevated LVEDP.   She was hospitalized in April 2024 with syncope with prolonged pauses. She underwent single-lead pacemaker placement. Echo during admission showed normal LVSF with mid to moderate MR.   She saw Dr. Kirke Corin 05/05/23 and felt very sluggish. HR was around 50bpm. Diltiazem was discontinued and amlodipine was added.   She saw EP 05/24/23 and BP was elevated. She was started on lasix for lower leg edema. Repeat limited echo showed LVEF 55-60%, severly dilated atrium, mild MR.   The patient called in reporting worsening lower leg edema despite Lasix use. She reports occasional chest tightness. She reports shortness of breath when she walks. No trouble laying flat or breathing at night. She is unsure if she is taking the amlodipine. She is significantly volume up on exam.  Past Medical History:  Diagnosis Date   Actinic keratosis    Arthritis    Atrial flutter (HCC) 02/2011   s/p cardioversion    Chest pain    a. H/o cardiac cath x 2-> 2012 -->nl cors;  b. 12/2016 MV: EF 61%, small region of mild perfusion defect in the apical anteroseptal region c/w breast attenuation, no ischemia-->Low risk; c. 06/2019 MV: Mod size, mild inflat ischemia, EF 59%. Cor and Ao  Ca2+. Inflat defect more pronounced on this study compared to last; c. 07/2019 Cath: LM nl, LAD min irregs, LCX nl, OM1/2/3 nl, RCA min irregs.   CKD (chronic kidney disease), stage III (HCC)    Concussion with no loss of consciousness 09/01/2016   Cystocele    Degenerative disorder of bone    GERD (gastroesophageal reflux disease)    Headache(784.0)    chronic   Hiatal hernia    Hyperlipidemia    Hypertension    Influenza 01/10/2017   Knee fracture    Migraines    Mobitz type 2 second degree atrioventricular block    a. felt to be 2/2 amiodarone, resolved with decreased amiodarone dose.  Amio since d/c'd.   Permanent atrial fibrillation (HCC)    a. status post multiple DCCVs; b. 2018 - eval for PVI but opted for rate control.   PONV (postoperative nausea and vomiting)    oxycodone and codiene cause N/V    Pre-syncope    a. In setting of dehydration and AKI in the past.   Sleep apnea    Squamous cell carcinoma of skin 11/10/2020   left distal posterior deltoid (EDC 01/15/2021)   Tachycardia induced cardiomyopathy (HCC)    a. Resolved;  b. 08/2017 Echo: EF 50-55%, no rwma, mild MR, mildly to mod dil LA/RA; c. 02/2018 Echo: EF 55-60%, mild MR. Mildly dil LA. Nl RVSP. PASP .   Venous insufficiency    Vertigo  Past Surgical History:  Procedure Laterality Date   ABDOMINAL HYSTERECTOMY  1990   APPENDECTOMY     AUGMENTATION MAMMAPLASTY Bilateral 1986   implants   AUGMENTATION MAMMAPLASTY  1990   AUGMENTATION MAMMAPLASTY  2011   CARDIAC CATHETERIZATION     CARDIOVERSION     x 3   CARDIOVERSION     CARDIOVERSION N/A 02/07/2017   Procedure: CARDIOVERSION;  Surgeon: Iran Ouch, MD;  Location: [facility] ORS;  Service: Cardiovascular;  Laterality: N/A;   CARDIOVERSION N/A 07/22/2017   Procedure: Cardioversion;  Surgeon: Antonieta Iba, MD;  Location: [facility] OR[facility];  Service: Cardiovascular;  Laterality: N/A;   CATARACT EXTRACTION W/PHACO Right 09/21/2016   Procedure: CATARACT  EXTRACTION PHACO AND INTRAOCULAR LENS PLACEMENT (IOC);  Surgeon: Galen Manila, MD;  Location: [facility] OR[facility];  Service: Ophthalmology;  Laterality: Right;  [place] 44.1 AP% 16.5 CDE 7.30 Fluid Pack Lot #[id number]   CATARACT EXTRACTION W/PHACO Left 10/19/2016   Procedure: CATARACT EXTRACTION PHACO AND INTRAOCULAR LENS PLACEMENT (IOC);  Surgeon: Galen Manila, MD;  Location: [facility];  Service: Ophthalmology;  Laterality: Left;  [place] 53.7 AP% 19.5 CDE 10.45 Fluid pack lot # [id number]   CHOLECYSTECTOMY     COMBINED AUGMENTATION MAMMAPLASTY AND ABDOMINOPLASTY     JOINT REPLACEMENT Left 06/04/2013   left knee   KNEE ARTHROSCOPY Right 08/16/2016   Procedure: ARTHROSCOPY KNEE, tear posterior horn medial meniscus, tear anterior and posterior horns of lateral meniscus, chondromalacia of lateral compartment grade 3 patella and grade 4 medial;  Surgeon: Donato Heinz, MD;  Location: [facility];  Service: Orthopedics;  Laterality: Right;   LEFT HEART CATH AND CORONARY ANGIOGRAPHY Left 07/27/2019   Procedure: LEFT HEART CATH AND CORONARY ANGIOGRAPHY;  Surgeon: Iran Ouch, MD;  Location: [facility] INVASIVE CV LAB;  Service: Cardiovascular;  Laterality: Left;   MASTECTOMY  1986   nipple sparing mastectomy/Bilateral with silicone  breast implants, s/p saline replacements   Multiple orthopedic procedures     NOSE SURGERY     PACEMAKER IMPLANT N/A 04/13/2023   Procedure: PACEMAKER IMPLANT;  Surgeon: Duke Salvia, MD;  Location: [facility] INVASIVE CV LAB;  Service: Cardiovascular;  Laterality: N/A;   TEE WITHOUT CARDIOVERSION N/A 09/26/2017   Procedure: TRANSESOPHAGEAL ECHOCARDIOGRAM (TEE);  Surgeon: Pricilla Riffle, MD;  Location: [facility] ENDOSCOPY;  Service: Cardiovascular;  Laterality: N/A;   TOTAL KNEE ARTHROPLASTY Left     Current Medications: Current Meds  Medication Sig   potassium chloride SA (KLOR-CON M20) 20 MEQ tablet Take 1 tablet (20 mEq total) by mouth daily.   torsemide (DEMADEX) 20 MG tablet Take 1 tablet  (20 mg total) by mouth daily.     Allergies:   Cyclobenzaprine, Iodine, Amiodarone, Biaxin [clarithromycin], Codeine, Digoxin and related, Enalapril, Famotidine, Fluocinonide, Iodinated contrast media, Iodine-131, Losartan potassium, Omnicef [cefdinir], Oxycodone, Promethazine, Sertraline, Tizanidine hcl, Warfarin and related, and Xarelto [rivaroxaban]   Social History   Socioeconomic History   Marital status: Married    Spouse name: Not on file   Number of children: 1   Years of education: Not on file   Highest education level: Not on file  Occupational History    Employer: RETIRED  Tobacco Use   Smoking status: Never   Smokeless tobacco: Never  Vaping Use   Vaping Use: Never used  Substance and Sexual Activity   Alcohol use: No   Drug use: No   Sexual activity: Never    Birth control/protection: Surgical  Other Topics Concern   Not on file  Social History Narrative   Married    Social Determinants of Health   Financial Resource Strain: Low Risk  (05/06/2022)   Overall Financial Resource Strain (CARDIA)    Difficulty of Paying Living Expenses: Not hard at all  Food Insecurity: No Food Insecurity (04/15/2023)   Hunger Vital Sign    Worried About Running Out of Food in the Last Year: Never true    Ran Out of Food in the Last Year: Never true  Transportation Needs: No Transportation Needs (04/15/2023)   PRAPARE - Administrator, Civil Service (Medical): No    Lack of Transportation (Non-Medical): No  Physical Activity: Insufficiently Active (05/06/2022)   Exercise Vital Sign    Days of Exercise per Week: 3 days    Minutes of Exercise per Session: 20 min  Stress: No Stress Concern Present (05/06/2022)   [employer] of Occupational Health - Occupational Stress Questionnaire    Feeling of Stress : Not at all  Social Connections: Unknown (05/06/2022)   Social Connection and Isolation Panel [NHANES]    Frequency of Communication with Friends and Family: Not on  file    Frequency of Social Gatherings with Friends and Family: Not on file    Attends Religious Services: Not on file    Active Member of Clubs or Organizations: Not on file    Attends Banker Meetings: Not on file    Marital Status: Married     Family History: The patient's family history includes Breast cancer in her maternal grandmother and sister; Breast cancer (age of onset: 36) in her sister; Breast cancer (age of onset: 96) in her sister; Breast cancer (age of onset: 69) in her sister; Diabetes in her brother; Esophageal cancer in her brother; Heart disease in her mother and son; Kidney failure in her brother; Stomach cancer in her father. There is no history of Ovarian cancer.  ROS:   Please see the history of present illness.     All other systems reviewed and are negative.  EKGs/Labs/Other Studies Reviewed:    The following studies were reviewed today:  Echo limited 06/06/23 1. Left ventricular ejection fraction, by estimation, is 55 to 60%. The  left ventricle has normal function.   2. Right ventricular systolic function is normal. The right ventricular  size is moderately enlarged. There is mildly elevated pulmonary artery  systolic pressure.   3. Left atrial size was severely dilated.   4. Right atrial size was severely dilated.   5. The mitral valve is normal in structure. Mild mitral valve  regurgitation.   6. Tricuspid valve regurgitation is moderate to severe.   7. The inferior vena cava is dilated in size with <50% respiratory  variability, suggesting right atrial pressure of 15 mmHg.   Echo 03/2023 1. Left ventricular ejection fraction, by estimation, is 55 to 60%. The  left ventricle has normal function. The left ventricle has no regional  wall motion abnormalities. Left ventricular diastolic parameters are  indeterminate.   2. Right ventricular systolic function is normal. The right ventricular  size is mildly enlarged. There is moderately  elevated pulmonary artery  systolic pressure. The estimated right ventricular systolic pressure is  51.8 mmHg.   3. Left atrial size was severely dilated.   4. Right atrial size was severely dilated.   5. The mitral valve is normal in structure. Mild to moderate mitral valve  regurgitation. No evidence of mitral stenosis.   6. Tricuspid valve regurgitation is mild to  moderate.   7. The aortic valve is normal in structure. Aortic valve regurgitation is  not visualized. No aortic stenosis is present.   8. The inferior vena cava is normal in size with greater than 50%  respiratory variability, suggesting right atrial pressure of 3 mmHg.   Myoview lexiscan 08/2022 Narrative & Impression  Pharmacological myocardial perfusion imaging study with no significant  ischemia Normal wall motion, EF estimated at 57% No EKG changes concerning for ischemia at peak stress or in recovery. CT attenuation correction images with moderate aortic atherosclerosis, mild coronary calcification Low risk scan     Signed, Dossie Arbour, MD, Ph.D [facility]   LHC 2020  The left ventricular systolic function is normal. LV end diastolic pressure is mildly elevated. The left ventricular ejection fraction is 55-65% by visual estimate.   1.  Minimal irregularities with no evidence of obstructive coronary artery disease. 2.  Normal LV systolic function and mildly elevated left ventricular end-diastolic pressure.   Recommendations: The chest pain is likely noncardiac.  Continue rate control and anticoagulation for atrial fibrillation.  Can resume Eliquis tomorrow morning.    EKG:  EKG is ordered today.  The ekg ordered today demonstrates V paced rhythm 55bpm, PVCs  Recent Labs: 04/08/2023: TSH 0.971 04/13/2023: Magnesium 2.0 05/30/2023: ALT 25; BUN 14; Creatinine, Ser 0.85; Hemoglobin 11.7; Platelets 243; Potassium 3.9; Sodium 133  Recent Lipid Panel    Component Value Date/Time   CHOL 149 12/29/2022 1515    TRIG 65.0 12/29/2022 1515   HDL 66.10 12/29/2022 1515   CHOLHDL 2 12/29/2022 1515   VLDL 13.0 12/29/2022 1515   LDLCALC 70 12/29/2022 1515   LDLDIRECT 72.0 12/29/2022 1515    Physical Exam:    VS:  BP (!) 142/68 (BP Location: Left Arm, Patient Position: Sitting, Cuff Size: Normal)   Pulse (!) 57   Ht 5\' 4"  (1.626 m)   Wt 173 lb 6.4 oz (78.7 kg)   SpO2 98%   BMI 29.76 kg/m     Wt Readings from Last 3 Encounters:  06/07/23 173 lb 6.4 oz (78.7 kg)  06/03/23 171 lb 6.4 oz (77.7 kg)  05/30/23 171 lb 2 oz (77.6 kg)     GEN:  Well nourished, well developed in no acute distress HEENT: Normal NECK: + JVD; No carotid bruits LYMPHATICS: No lymphadenopathy CARDIAC: Irreg Irreg, no murmurs, rubs, gallops RESPIRATORY:  crackles at bases  ABDOMEN: Soft, non-tender, non-distended MUSCULOSKELETAL:  3+ lower leg edema; No deformity  SKIN: Warm and dry NEUROLOGIC:  Alert and oriented x 3 PSYCHIATRIC:  Normal affect   ASSESSMENT:    1. Acute on chronic diastolic heart failure (HCC)   2. Lower extremity edema   3. SOB (shortness of breath)   4. Permanent atrial fibrillation (HCC)   5. Tachycardia-bradycardia (HCC)   6. Essential hypertension    PLAN:    In order of problems listed above:  Acute on chronic diastolic heart failure Recent repeat limited echo showed normal LVEF with elevated right atrial pressure/CVP. She has been taking lasix 20mg  daily. Patient is extremely volume overloaded on exam. We dicussed ER vs outpatient follow-up, but the patient would like to stay out of the hospital. I suspect she is up about 13 lbs. We will stop lasix and started Torsemide 20mg  BID x 5 days then go down to Torsemide 20mg  daily. Potassium daily with diuretic. Recommended daily weights, low salt and leg elevation. I will see her back in 1 week, labs at that visit.  Permanent Afib Continue Eliquis 5mg  BID for stroke ppx. Continue Lopressor 25mg  BID for rate control.   Tachy-brady s/p  PPM Followed by EP.   HTN BP up in the setting of fluid overload. Says she is not taking amlodipine. Continue Lopressor.   Disposition: Follow up in 1 week(s) with MD/APP    Signed, Emnet Monk David Stall, PA-C  06/07/2023 10:11 AM    [facility]

## 2023-06-11 NOTE — Telephone Encounter (Signed)
Noted  

## 2023-06-13 ENCOUNTER — Telehealth: Payer: Self-pay

## 2023-06-13 DIAGNOSIS — R4189 Other symptoms and signs involving cognitive functions and awareness: Secondary | ICD-10-CM

## 2023-06-13 DIAGNOSIS — M542 Cervicalgia: Secondary | ICD-10-CM | POA: Insufficient documentation

## 2023-06-13 MED ORDER — BUSPIRONE HCL 5 MG PO TABS
5.0000 mg | ORAL_TABLET | Freq: Two times a day (BID) | ORAL | 0 refills | Status: DC
Start: 2023-06-13 — End: 2023-06-20

## 2023-06-13 NOTE — Assessment & Plan Note (Signed)
Intolerant of 5 mg lexapro dose due to constant nausea.  Will try buspirone.

## 2023-06-13 NOTE — Assessment & Plan Note (Addendum)
Advised patient to do massage, stretching, using Voltaren gel and heat compress. Robaxin sent to the pharmacy, advised patient to take it at bedtime. Referral for PT has been sent to [facility] physical therapy. If symptoms not improving call the office for further e

## 2023-06-13 NOTE — Telephone Encounter (Signed)
Patient's husband, Alaycia Eardley, states the medication Dr. Duncan Dull prescribed, escitalopram (LEXAPRO) 10 MG tablet made patient sick.  Jake Shark states patient's memory has gotten worse since she started taking the medicine.  Jake Shark states he would like to know what Dr. Darrick Huntsman recommends since patient cannot tolerate this medication.

## 2023-06-13 NOTE — Telephone Encounter (Signed)
Spoke with pt's husband and he stated that pt took a 1/2 tablet for seven days before going up to the whole tablet.

## 2023-06-14 ENCOUNTER — Encounter: Payer: Self-pay | Admitting: Medical

## 2023-06-14 ENCOUNTER — Other Ambulatory Visit
Admission: RE | Admit: 2023-06-14 | Discharge: 2023-06-14 | Disposition: A | Discharge status: Home / Self Care | Payer: Medicare Other | Source: Ambulatory Visit | Attending: Medical | Admitting: Medical

## 2023-06-14 ENCOUNTER — Ambulatory Visit: Payer: Medicare Other | Attending: Medical | Admitting: Medical

## 2023-06-14 VITALS — BP 132/60 | HR 50 | Ht 64.0 in | Wt 166.2 lb

## 2023-06-14 DIAGNOSIS — I10 Essential (primary) hypertension: Secondary | ICD-10-CM

## 2023-06-14 DIAGNOSIS — Z79899 Other long term (current) drug therapy: Secondary | ICD-10-CM | POA: Diagnosis not present

## 2023-06-14 DIAGNOSIS — I495 Sick sinus syndrome: Secondary | ICD-10-CM

## 2023-06-14 DIAGNOSIS — I5033 Acute on chronic diastolic (congestive) heart failure: Secondary | ICD-10-CM | POA: Diagnosis not present

## 2023-06-14 DIAGNOSIS — I4821 Permanent atrial fibrillation: Secondary | ICD-10-CM

## 2023-06-14 LAB — BASIC METABOLIC PANEL
Anion gap: 8 (ref 5–15)
BUN: 13 mg/dL (ref 8–23)
CO2: 26 mmol/L (ref 22–32)
Calcium: 8.9 mg/dL (ref 8.9–10.3)
Chloride: 93 mmol/L — ABNORMAL LOW (ref 98–111)
Creatinine, Ser: 0.83 mg/dL (ref 0.44–1.00)
GFR, Estimated: >60 mL/min (ref >60)
Glucose, Bld: 155 mg/dL — ABNORMAL HIGH (ref 70–99)
Potassium: 3.6 mmol/L (ref 3.5–5.1)
Sodium: 127 mmol/L — ABNORMAL LOW (ref 135–145)

## 2023-06-14 NOTE — Progress Notes (Signed)
Cardiology Office Note:    Date:  06/14/2023   ID:  [name] [name], DOB 10/10/1936, MRN [id number]  PCP:  Sherlene Shams, MD  [facility] Cardiologist:  Lorine Bears, MD  [facility] Electrophysiologist:  Sherryl Manges, MD   Referring MD: Sherlene Shams, MD   Chief Complaint: 1 week follow-up  History of Present Illness:    [name] [name] is a 87 y.o. female with a hx of permanent Afib/flutter, HTN, tachy-brady s/p PPM, dementia who presents for 1 week follow-up.   She has a history of afib with multiple cardioversions. Now, she is rate controlled. She has a history of tachy-induced CM, but this has resolved.   Cath in 2020 showed minimal irregularities with no evidence of obstructive CAD, normal LVEF and mildly elevated LVEDP.  She was hospitalized in April 2024 with syncope with prolonged pauses. She underwent single-lead pacemaker placement. Echo during admission showed normal LVSF with mid to moderate MR.    She saw Dr. Kirke Corin 05/05/23 and felt very sluggish. HR was around 50bpm. Diltiazem was discontinued and amlodipine was added.    She saw EP 05/24/23 and BP was elevated. She was started on lasix for lower leg edema. Repeat limited echo showed LVEF 55-60%, severly dilated atrium, mild MR.   She was last seen 06/05/23 reporting lower leg edema despite lasix use. Lasix was stopped and she was started on Torsemide 20mg  BID x 5 days, then torsemide 20mg  daily.   Today, swelling is overall better, but still present. The patient is overall feeling better. Patient denies chest pain or SOB. PCP started her on an antidepressant.   Past Medical History:  Diagnosis Date   Actinic keratosis    Arthritis    Atrial flutter (HCC) 02/2011   s/p cardioversion    Chest pain    a. H/o cardiac cath x 2-> 2012 -->nl cors;  b. 12/2016 MV: EF 61%, small region of mild perfusion defect in the apical anteroseptal region c/w breast attenuation, no ischemia-->Low risk; c. 06/2019 MV: Mod size,  mild inflat ischemia, EF 59%. Cor and Ao Ca2+. Inflat defect more pronounced on this study compared to last; c. 07/2019 Cath: LM nl, LAD min irregs, LCX nl, OM1/2/3 nl, RCA min irregs.   CKD (chronic kidney disease), stage III (HCC)    Concussion with no loss of consciousness 09/01/2016   Cystocele    Degenerative disorder of bone    GERD (gastroesophageal reflux disease)    Headache(784.0)    chronic   Hiatal hernia    Hyperlipidemia    Hypertension    Influenza 01/10/2017   Knee fracture    Migraines    Mobitz type 2 second degree atrioventricular block    a. felt to be 2/2 amiodarone, resolved with decreased amiodarone dose.  Amio since d/c'd.   Permanent atrial fibrillation (HCC)    a. status post multiple DCCVs; b. 2018 - eval for PVI but opted for rate control.   PONV (postoperative nausea and vomiting)    oxycodone and codiene cause N/V    Pre-syncope    a. In setting of dehydration and AKI in the past.   Sleep apnea    Squamous cell carcinoma of skin 11/10/2020   left distal posterior deltoid (EDC 01/15/2021)   Tachycardia induced cardiomyopathy (HCC)    a. Resolved;  b. 08/2017 Echo: EF 50-55%, no rwma, mild MR, mildly to mod dil LA/RA; c. 02/2018 Echo: EF 55-60%, mild MR. Mildly dil LA. Nl RVSP.  PASP .   Venous insufficiency    Vertigo     Past Surgical History:  Procedure Laterality Date   ABDOMINAL HYSTERECTOMY  1990   APPENDECTOMY     AUGMENTATION MAMMAPLASTY Bilateral 1986   implants   AUGMENTATION MAMMAPLASTY  1990   AUGMENTATION MAMMAPLASTY  2011   CARDIAC CATHETERIZATION     CARDIOVERSION     x 3   CARDIOVERSION     CARDIOVERSION N/A 02/07/2017   Procedure: CARDIOVERSION;  Surgeon: Iran Ouch, MD;  Location: [facility] ORS;  Service: Cardiovascular;  Laterality: N/A;   CARDIOVERSION N/A 07/22/2017   Procedure: Cardioversion;  Surgeon: Antonieta Iba, MD;  Location: [facility] ORS;  Service: Cardiovascular;  Laterality: N/A;   CATARACT EXTRACTION W/PHACO  Right 09/21/2016   Procedure: CATARACT EXTRACTION PHACO AND INTRAOCULAR LENS PLACEMENT (IOC);  Surgeon: Galen Manila, MD;  Location: [facility] OR[facility];  Service: Ophthalmology;  Laterality: Right;  [place] 44.1 AP% 16.5 CDE 7.30 Fluid Pack Lot #[id number]   CATARACT EXTRACTION W/PHACO Left 10/19/2016   Procedure: CATARACT EXTRACTION PHACO AND INTRAOCULAR LENS PLACEMENT (IOC);  Surgeon: Galen Manila, MD;  Location: [facility] OR[facility];  Service: Ophthalmology;  Laterality: Left;  [place] 53.7 AP% 19.5 CDE 10.45 Fluid pack lot # [id number]   CHOLECYSTECTOMY     COMBINED AUGMENTATION MAMMAPLASTY AND ABDOMINOPLASTY     JOINT REPLACEMENT Left 06/04/2013   left knee   KNEE ARTHROSCOPY Right 08/16/2016   Procedure: ARTHROSCOPY KNEE, tear posterior horn medial meniscus, tear anterior and posterior horns of lateral meniscus, chondromalacia of lateral compartment grade 3 patella and grade 4 medial;  Surgeon: Donato Heinz, MD;  Location: [facility];  Service: Orthopedics;  Laterality: Right;   LEFT HEART CATH AND CORONARY ANGIOGRAPHY Left 07/27/2019   Procedure: LEFT HEART CATH AND CORONARY ANGIOGRAPHY;  Surgeon: Iran Ouch, MD;  Location: [facility] INVASIVE CV [facility];  Service: Cardiovascular;  Laterality: Left;   MASTECTOMY  1986   nipple sparing mastectomy/Bilateral with silicone  breast implants, s/p saline replacements   Multiple orthopedic procedures     NOSE SURGERY     PACEMAKER IMPLANT N/A 04/13/2023   Procedure: PACEMAKER IMPLANT;  Surgeon: Duke Salvia, MD;  Location: [facility] INVASIVE CV LAB;  Service: Cardiovascular;  Laterality: N/A;   TEE WITHOUT CARDIOVERSION N/A 09/26/2017   Procedure: TRANSESOPHAGEAL ECHOCARDIOGRAM (TEE);  Surgeon: Pricilla Riffle, MD;  Location: [facility];  Service: Cardiovascular;  Laterality: N/A;   TOTAL KNEE ARTHROPLASTY Left     Current Medications: Current Meds  Medication Sig   acetaminophen (TYLENOL) 500 MG tablet Take 1,000 mg by mouth every 6 (six) hours as needed (pain).     aluminum-magnesium hydroxide 200-200 MG/5ML suspension Take 20 mLs by mouth every 6 (six) hours as needed for indigestion.    amoxicillin (AMOXIL) 500 MG capsule Take 500 mg by mouth every 6 (six) hours.   apixaban (ELIQUIS) 5 MG TABS tablet TAKE ONE TABLET TWICE DAILY   busPIRone (BUSPAR) 5 MG tablet Take 1 tablet (5 mg total) by mouth 2 (two) times daily.   Carboxymethylcellul-Glycerin (LUBRICATING EYE DROPS OP) Place 1 drop into both eyes daily as needed (dry eyes).   cholecalciferol (VITAMIN D) 1000 units tablet Take 1,000 Units by mouth daily.   Coenzyme Q10 (COQ10) 200 MG CAPS Take 200 mg by mouth daily.   methocarbamol (ROBAXIN) 500 MG tablet Take 1 tablet (500 mg total) by mouth at bedtime.   metoprolol tartrate (LOPRESSOR) 25 MG tablet Take 0.5 tablets (12.5 mg total) by mouth  2 (two) times daily.   pantoprazole (PROTONIX) 40 MG tablet TAKE 1 TABLET BY MOUTH DAILY   potassium chloride SA (KLOR-CON M20) 20 MEQ tablet Take 1 tablet (20 mEq total) by mouth daily.   pravastatin (PRAVACHOL) 20 MG tablet TAKE 1 TABLET BY MOUTH DAILY   Probiotic Product (PROBIOTIC ADVANCED PO) Take 1 tablet by mouth daily.   sodium chloride (OCEAN) 0.65 % SOLN nasal spray Place 1 spray into both nostrils daily as needed for congestion.   torsemide (DEMADEX) 20 MG tablet Take 1 tablet (20 mg total) by mouth daily.     Allergies:   Cyclobenzaprine, Iodine, Amiodarone, Biaxin [clarithromycin], Codeine, Digoxin and related, Enalapril, Famotidine, Fluocinonide, Iodinated contrast media, Iodine-131, Losartan potassium, Omnicef [cefdinir], Oxycodone, Promethazine, Sertraline, Tizanidine hcl, Warfarin and related, and Xarelto [rivaroxaban]   Social History   Socioeconomic History   Marital status: Married    Spouse name: Not on file   Number of children: 1   Years of education: Not on file   Highest education level: Not on file  Occupational History    Employer: RETIRED  Tobacco Use   Smoking status: Never    Smokeless tobacco: Never  Vaping Use   Vaping Use: Never used  Substance and Sexual Activity   Alcohol use: No   Drug use: No   Sexual activity: Never    Birth control/protection: Surgical  Other Topics Concern   Not on file  Social History Narrative   Married    Social Determinants of Health   Financial Resource Strain: Low Risk  (05/06/2022)   Overall Financial Resource Strain (CARDIA)    Difficulty of Paying Living Expenses: Not hard at all  Food Insecurity: No Food Insecurity (04/15/2023)   Hunger Vital Sign    Worried About Running Out of Food in the Last Year: Never true    Ran Out of Food in the Last Year: Never true  Transportation Needs: No Transportation Needs (04/15/2023)   PRAPARE - Administrator, Civil Service (Medical): No    Lack of Transportation (Non-Medical): No  Physical Activity: Insufficiently Active (05/06/2022)   Exercise Vital Sign    Days of Exercise per Week: 3 days    Minutes of Exercise per Session: 20 min  Stress: No Stress Concern Present (05/06/2022)   [employer] of Occupational Health - Occupational Stress Questionnaire    Feeling of Stress : Not at all  Social Connections: Unknown (05/06/2022)   Social Connection and Isolation Panel [NHANES]    Frequency of Communication with Friends and Family: Not on file    Frequency of Social Gatherings with Friends and Family: Not on file    Attends Religious Services: Not on file    Active Member of Clubs or Organizations: Not on file    Attends Banker Meetings: Not on file    Marital Status: Married     Family History: The patient's family history includes Breast cancer in her maternal grandmother and sister; Breast cancer (age of onset: 59) in her sister; Breast cancer (age of onset: 79) in her sister; Breast cancer (age of onset: 21) in her sister; Diabetes in her brother; Esophageal cancer in her brother; Heart disease in her mother and son; Kidney failure in her  brother; Stomach cancer in her father. There is no history of Ovarian cancer.  ROS:   Please see the history of present illness.     All other systems reviewed and are negative.  EKGs/Labs/Other Studies Reviewed:  The following studies were reviewed today:  Echo limited 06/06/23 1. Left ventricular ejection fraction, by estimation, is 55 to 60%. The  left ventricle has normal function.   2. Right ventricular systolic function is normal. The right ventricular  size is moderately enlarged. There is mildly elevated pulmonary artery  systolic pressure.   3. Left atrial size was severely dilated.   4. Right atrial size was severely dilated.   5. The mitral valve is normal in structure. Mild mitral valve  regurgitation.   6. Tricuspid valve regurgitation is moderate to severe.   7. The inferior vena cava is dilated in size with <50% respiratory  variability, suggesting right atrial pressure of 15 mmHg.    Echo 03/2023 1. Left ventricular ejection fraction, by estimation, is 55 to 60%. The  left ventricle has normal function. The left ventricle has no regional  wall motion abnormalities. Left ventricular diastolic parameters are  indeterminate.   2. Right ventricular systolic function is normal. The right ventricular  size is mildly enlarged. There is moderately elevated pulmonary artery  systolic pressure. The estimated right ventricular systolic pressure is  51.8 mmHg.   3. Left atrial size was severely dilated.   4. Right atrial size was severely dilated.   5. The mitral valve is normal in structure. Mild to moderate mitral valve  regurgitation. No evidence of mitral stenosis.   6. Tricuspid valve regurgitation is mild to moderate.   7. The aortic valve is normal in structure. Aortic valve regurgitation is  not visualized. No aortic stenosis is present.   8. The inferior vena cava is normal in size with greater than 50%  respiratory variability, suggesting right atrial pressure  of 3 mmHg.    Myoview lexiscan 08/2022 Narrative & Impression  Pharmacological myocardial perfusion imaging study with no significant  ischemia Normal wall motion, EF estimated at 57% No EKG changes concerning for ischemia at peak stress or in recovery. CT attenuation correction images with moderate aortic atherosclerosis, mild coronary calcification Low risk scan     Signed, Dossie Arbour, MD, Ph.D [facility]    LHC 2020  The left ventricular systolic function is normal. LV end diastolic pressure is mildly elevated. The left ventricular ejection fraction is 55-65% by visual estimate.   1.  Minimal irregularities with no evidence of obstructive coronary artery disease. 2.  Normal LV systolic function and mildly elevated left ventricular end-diastolic pressure.   Recommendations: The chest pain is likely noncardiac.  Continue rate control and anticoagulation for atrial fibrillation.  Can resume Eliquis tomorrow morning.    EKG:  EKG is not ordered today.    Recent Labs: 04/08/2023: TSH 0.971 04/13/2023: Magnesium 2.0 05/30/2023: ALT 25; BUN 14; Creatinine, Ser 0.85; Hemoglobin 11.7; Platelets 243; Potassium 3.9; Sodium 133  Recent Lipid Panel    Component Value Date/Time   CHOL 149 12/29/2022 1515   TRIG 65.0 12/29/2022 1515   HDL 66.10 12/29/2022 1515   CHOLHDL 2 12/29/2022 1515   VLDL 13.0 12/29/2022 1515   LDLCALC 70 12/29/2022 1515   LDLDIRECT 72.0 12/29/2022 1515     Physical Exam:    VS:  BP 132/60 (BP Location: Left Arm, Patient Position: Sitting, Cuff Size: Normal)   Pulse (!) 50   Ht 5\' 4"  (1.626 m)   Wt 166 lb 3.2 oz (75.4 kg)   SpO2 94%   BMI 28.53 kg/m     Wt Readings from Last 3 Encounters:  06/14/23 166 lb 3.2 oz (75.4 kg)  06/07/23 173 lb 3.2 oz (78.6 kg)  06/07/23 173 lb 6.4 oz (78.7 kg)     GEN:  Well nourished, well developed in no acute distress HEENT: Normal NECK: No JVD; No carotid bruits LYMPHATICS: No lymphadenopathy CARDIAC: RRR,  no murmurs, rubs, gallops RESPIRATORY:  Clear to auscultation without rales, wheezing or rhonchi  ABDOMEN: Soft, non-tender, non-distended MUSCULOSKELETAL:  2+ lower extremity edema; No deformity  SKIN: Warm and dry NEUROLOGIC:  Alert and oriented x 3 PSYCHIATRIC:  Normal affect   ASSESSMENT:    1. Acute on chronic diastolic heart failure (HCC)   2. Medication management   3. Permanent atrial fibrillation (HCC)   4. Tachycardia-bradycardia (HCC)   5. Essential hypertension    PLAN:    In order of problems listed above:  Acute on chronic diastolic heart failure Lower extremity swelling is improved, but still present. She is taking Torsemide 20mg  daily with potassium supplement. BMET today. She has 2+ lower leg edema still. I will increase Torsemide to 20mg  BID x 3 days, then down to Torsemide 20mg  daily.   Permanent Afib Continue Eliquis 5mg  BID for stroke ppx. Continue Lopressor 25mg  BID for rate control.   Tachy-brady syndrome s/p PPM Followed by EP.   HTN BP improving as volume status improves.   Disposition: Follow up in 2 week(s) with MD/APP    Signed, Almond Fitzgibbon David Stall, PA-C  06/14/2023 9:30 AM    [facility]

## 2023-06-14 NOTE — Patient Instructions (Signed)
Medication Instructions:  Your physician recommends the following medication changes.  INCREASE: Torsemide to 20 mg twice a day for 3 days, then return to 20 mg daily Tuesday- 1 tablet in the morning and evening Wednesday- 1 tablet in the morning and evening Thursday- 1 tablet in the morning and evening Starting Friday, take 1 tablet daily every morning   *If you need a refill on your cardiac medications before your next appointment, please call your pharmacy*   Lab Work: Your provider would like for you to have following labs drawn: (BMP).   Please go to the [facility] entrance and check in at the front desk.  You do not need an appointment.  They are open from 7am-6 pm.     Testing/Procedures: None ordered today   Follow-Up: At [facility], you and your health needs are our priority.  As part of our continuing mission to provide you with exceptional heart care, we have created designated Provider Care Teams.  These Care Teams include your primary Cardiologist (physician) and Advanced Practice Providers (APPs -  Physician Assistants and Nurse Practitioners) who all work together to provide you with the care you need, when you need it.  We recommend signing up for the patient portal called "MyChart".  Sign up information is provided on this After Visit Summary.  MyChart is used to connect with patients for Virtual Visits (Telemedicine).  Patients are able to view lab/test results, encounter notes, upcoming appointments, etc.  Non-urgent messages can be sent to your provider as well.   To learn more about what you can do with MyChart, go to [email or web address].    Your next appointment:   2 week(s)  Provider:   Terrilee Croak, PA-C

## 2023-06-15 NOTE — Telephone Encounter (Signed)
Spoke with pt's husband and he stated that pt took her first pill this morning.

## 2023-06-20 ENCOUNTER — Telehealth: Payer: Self-pay

## 2023-06-20 MED ORDER — ALPRAZOLAM 0.25 MG PO TABS: 0.2500 mg | ORAL_TABLET | Freq: Two times a day (BID) | ORAL | 0 refills | Status: DC | PRN

## 2023-06-20 NOTE — Telephone Encounter (Signed)
Spoke with pt's husband and he stated that the buspirone is doing the same thing as the last antidepressant that was sent in. He stated that pt's memory is worse and pt isn't sleeping at night. Pt is taking it twice daily. Can pt just stop the medication? They would like to wait on starting anything else until her follow appt and they can discuss it with you.

## 2023-06-20 NOTE — Telephone Encounter (Signed)
error 

## 2023-06-20 NOTE — Addendum Note (Signed)
Addended by: Sherlene Shams on: 06/20/2023 05:05 PM   Modules accepted: Orders

## 2023-06-21 ENCOUNTER — Encounter: Payer: Self-pay | Admitting: Cardiovascular Disease

## 2023-06-21 ENCOUNTER — Ambulatory Visit: Payer: Medicare Other | Attending: Cardiovascular Disease | Admitting: Cardiovascular Disease

## 2023-06-21 VITALS — BP 130/74 | HR 50 | Ht 64.0 in | Wt 162.4 lb

## 2023-06-21 DIAGNOSIS — I5032 Chronic diastolic (congestive) heart failure: Secondary | ICD-10-CM | POA: Diagnosis not present

## 2023-06-21 DIAGNOSIS — R410 Disorientation, unspecified: Secondary | ICD-10-CM

## 2023-06-21 DIAGNOSIS — I10 Essential (primary) hypertension: Secondary | ICD-10-CM

## 2023-06-21 DIAGNOSIS — R600 Localized edema: Secondary | ICD-10-CM | POA: Diagnosis not present

## 2023-06-21 DIAGNOSIS — I4821 Permanent atrial fibrillation: Secondary | ICD-10-CM | POA: Diagnosis not present

## 2023-06-21 DIAGNOSIS — E785 Hyperlipidemia, unspecified: Secondary | ICD-10-CM

## 2023-06-21 NOTE — Progress Notes (Signed)
Cardiology Office Note   Date:  06/21/2023   ID:  [name] [name], DOB [birth year], MRN [id number]  PCP:  Sherlene Shams, MD  Cardiologist:   Lorine Bears, MD   Chief Complaint  Patient presents with   Follow-up    1 month f/u pt c/o dementia or loss of memory. Meds reviewed verbally with pt.       History of Present Illness: [name] [name] is a 87 y.o. female who presents for a followup visit regarding permanent atrial fibrillation.   She has known history of  atrial fibrillation/flutter status post multiple cardioversions in the past but currently with chronic atrial fibrillation being treated with rate control.  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    Most recent cardiac catheterization in 2020 showed minimal irregularities with no evidence of obstructive coronary artery disease, normal ejection fraction mildly elevated left ventricular end-diastolic pressure.  She was hospitalized in April with syncope with prolonged pauses.  She underwent single-lead pacemaker placement by Dr. Graciela Husbands.  Echocardiogram during that admission showed normal LV systolic function with mild to moderate mitral regurgitation and mild to moderate tricuspid regurgitation.  She continues to be bradycardic requiring pacing and thus diltiazem was discontinued.  The dose of metoprolol was also decreased.  She had worsening heart failure symptoms and lower extremity edema recently that required diuresis. She had an echocardiogram done in June which showed normal LV systolic function, indeterminate diastolic function, mild pulmonary hypertension, mild mitral regurgitation, moderate to severe tricuspid regurgitation with severely dilated IVC.  Her symptoms improved with diuresis.  Her biggest issue now seems to be decline in memory and difficulty sleeping at night.  Her husband [name] [name] has been doing almost all activities of daily living.  Past Medical History:   Diagnosis Date   Actinic keratosis    Arthritis    Atrial flutter (HCC) 02/2011   s/p cardioversion    Chest pain    a. H/o cardiac cath x 2-> 2012 -->nl cors;  b. 12/2016 MV: EF 61%, small region of mild perfusion defect in the apical anteroseptal region c/w breast attenuation, no ischemia-->Low risk; c. 06/2019 MV: Mod size, mild inflat ischemia, EF 59%. Cor and Ao Ca2+. Inflat defect more pronounced on this study compared to last; c. 07/2019 Cath: LM nl, LAD min irregs, LCX nl, OM1/2/3 nl, RCA min irregs.   CKD (chronic kidney disease), stage III (HCC)    Concussion with no loss of consciousness 09/01/2016   Cystocele    Degenerative disorder of bone    GERD (gastroesophageal reflux disease)    Headache(784.0)    chronic   Hiatal hernia    Hyperlipidemia    Hypertension    Influenza 01/10/2017   Knee fracture    Migraines    Mobitz type 2 second degree atrioventricular block    a. felt to be 2/2 amiodarone, resolved with decreased amiodarone dose.  Amio since d/c'd.   Permanent atrial fibrillation (HCC)    a. status post multiple DCCVs; b. 2018 - eval for PVI but opted for rate control.   PONV (postoperative nausea and vomiting)    oxycodone and codiene cause N/V    Pre-syncope    a. In setting of dehydration and AKI in the past.   Sleep apnea    Squamous cell carcinoma of skin 11/10/2020   left distal posterior deltoid (EDC 01/15/2021)   Tachycardia induced cardiomyopathy (HCC)    a. Resolved;  b. 08/2017 Echo:  EF 50-55%, no rwma, mild MR, mildly to mod dil LA/RA; c. 02/2018 Echo: EF 55-60%, mild MR. Mildly dil LA. Nl RVSP. PASP .   Venous insufficiency    Vertigo     Past Surgical History:  Procedure Laterality Date   ABDOMINAL HYSTERECTOMY  1990   APPENDECTOMY     AUGMENTATION MAMMAPLASTY Bilateral 1986   implants   AUGMENTATION MAMMAPLASTY  1990   AUGMENTATION MAMMAPLASTY  2011   CARDIAC CATHETERIZATION     CARDIOVERSION     x 3   CARDIOVERSION      CARDIOVERSION N/A 02/07/2017   Procedure: CARDIOVERSION;  Surgeon: Iran Ouch, MD;  Location: [facility] ORS;  Service: Cardiovascular;  Laterality: N/A;   CARDIOVERSION N/A 07/22/2017   Procedure: Cardioversion;  Surgeon: Antonieta Iba, MD;  Location: [facility];  Service: Cardiovascular;  Laterality: N/A;   CATARACT EXTRACTION W/PHACO Right 09/21/2016   Procedure: CATARACT EXTRACTION PHACO AND INTRAOCULAR LENS PLACEMENT (IOC);  Surgeon: Galen Manila, MD;  Location: [facility];  Service: Ophthalmology;  Laterality: Right;  [place] 44.1 AP% 16.5 CDE 7.30 Fluid Pack Lot #[id number]   CATARACT EXTRACTION W/PHACO Left 10/19/2016   Procedure: CATARACT EXTRACTION PHACO AND INTRAOCULAR LENS PLACEMENT (IOC);  Surgeon: Galen Manila, MD;  Location: [facility];  Service: Ophthalmology;  Laterality: Left;  [place] 53.7 AP% 19.5 CDE 10.45 Fluid pack lot # [id number]   CHOLECYSTECTOMY     COMBINED AUGMENTATION MAMMAPLASTY AND ABDOMINOPLASTY     JOINT REPLACEMENT Left 06/04/2013   left knee   KNEE ARTHROSCOPY Right 08/16/2016   Procedure: ARTHROSCOPY KNEE, tear posterior horn medial meniscus, tear anterior and posterior horns of lateral meniscus, chondromalacia of lateral compartment grade 3 patella and grade 4 medial;  Surgeon: Donato Heinz, MD;  Location: [facility];  Service: Orthopedics;  Laterality: Right;   LEFT HEART CATH AND CORONARY ANGIOGRAPHY Left 07/27/2019   Procedure: LEFT HEART CATH AND CORONARY ANGIOGRAPHY;  Surgeon: Iran Ouch, MD;  Location: [facility];  Service: Cardiovascular;  Laterality: Left;   MASTECTOMY  1986   nipple sparing mastectomy/Bilateral with silicone  breast implants, s/p saline replacements   Multiple orthopedic procedures     NOSE SURGERY     PACEMAKER IMPLANT N/A 04/13/2023   Procedure: PACEMAKER IMPLANT;  Surgeon: Duke Salvia, MD;  Location: [facility];  Service: Cardiovascular;  Laterality: N/A;   TEE WITHOUT CARDIOVERSION N/A 09/26/2017    Procedure: TRANSESOPHAGEAL ECHOCARDIOGRAM (TEE);  Surgeon: Pricilla Riffle, MD;  Location: [facility];  Service: Cardiovascular;  Laterality: N/A;   TOTAL KNEE ARTHROPLASTY Left      Current Outpatient Medications  Medication Sig Dispense Refill   acetaminophen (TYLENOL) 500 MG tablet Take 1,000 mg by mouth every 6 (six) hours as needed (pain).      aluminum-magnesium hydroxide 200-200 MG/5ML suspension Take 20 mLs by mouth every 6 (six) hours as needed for indigestion.      amoxicillin (AMOXIL) 500 MG capsule Take 500 mg by mouth every 6 (six) hours.     apixaban (ELIQUIS) 5 MG TABS tablet TAKE ONE TABLET TWICE DAILY 60 tablet 5   Carboxymethylcellul-Glycerin (LUBRICATING EYE DROPS OP) Place 1 drop into both eyes daily as needed (dry eyes).     cholecalciferol (VITAMIN D) 1000 units tablet Take 1,000 Units by mouth daily.     Coenzyme Q10 (COQ10) 200 MG CAPS Take 200 mg by mouth daily.     methocarbamol (ROBAXIN) 500 MG tablet Take 1 tablet (500 mg  total) by mouth at bedtime. 20 tablet 0   metoprolol tartrate (LOPRESSOR) 25 MG tablet Take 0.5 tablets (12.5 mg total) by mouth 2 (two) times daily. 90 tablet 1   pantoprazole (PROTONIX) 40 MG tablet TAKE 1 TABLET BY MOUTH DAILY 90 tablet 1   potassium chloride SA (KLOR-CON M20) 20 MEQ tablet Take 1 tablet (20 mEq total) by mouth daily. 90 tablet 3   pravastatin (PRAVACHOL) 20 MG tablet TAKE 1 TABLET BY MOUTH DAILY 90 tablet 0   Probiotic Product (PROBIOTIC ADVANCED PO) Take 1 tablet by mouth daily.     sodium chloride (OCEAN) 0.65 % SOLN nasal spray Place 1 spray into both nostrils daily as needed for congestion.     torsemide (DEMADEX) 20 MG tablet Take 1 tablet (20 mg total) by mouth daily. 90 tablet 3   ALPRAZolam (XANAX) 0.25 MG tablet Take 1 tablet (0.25 mg total) by mouth 2 (two) times daily as needed for anxiety. (Patient not taking: Reported on 06/21/2023) 20 tablet 0   No current facility-administered medications for this visit.     Allergies:   Cyclobenzaprine, Iodine, Amiodarone, Biaxin [clarithromycin], Codeine, Digoxin and related, Enalapril, Famotidine, Fluocinonide, Iodinated contrast media, Iodine-131, Losartan potassium, Omnicef [cefdinir], Oxycodone, Promethazine, Sertraline, Tizanidine hcl, Warfarin and related, and Xarelto [rivaroxaban]    Social History:  The patient  reports that she has never smoked. She has never used smokeless tobacco. She reports that she does not drink alcohol and does not use drugs.   Family History:  The patient's family history includes Breast cancer in her maternal grandmother and sister; Breast cancer (age of onset: 75) in her sister; Breast cancer (age of onset: 39) in her sister; Breast cancer (age of onset: 51) in her sister; Diabetes in her brother; Esophageal cancer in her brother; Heart disease in her mother and son; Kidney failure in her brother; Stomach cancer in her father.    ROS:  Please see the history of present illness.   Otherwise, review of systems are positive for none.   All other systems are reviewed and negative.    PHYSICAL EXAM: VS:  BP 130/74 (BP Location: Right Arm, Patient Position: Sitting, Cuff Size: Normal)   Pulse (!) 50   Ht 5\' 4"  (1.626 m)   Wt 162 lb 6 oz (73.7 kg)   SpO2 98%   BMI 27.87 kg/m  , BMI Body mass index is 27.87 kg/m. GEN: Well nourished, well developed, in no acute distress  HEENT: Intact Neck: no JVD, carotid bruits, or masses Cardiac: Regular rate and rhythm with bradycardia.; no murmurs, rubs, or gallops, mild bilateral leg edema  Respiratory:  clear to auscultation bilaterally, normal work of breathing GI: soft, nontender, nondistended, + BS MS: no deformity or atrophy  Skin: warm and dry, no rash Neuro:  Strength and sensation are intact Psych: She is not oriented.  She did not know the date, the place or who is the president.   EKG:  EKG is ordered today. The ekg ordered today demonstrates  Ventricular-paced  rhythm When compared with ECG of 30-Apr-2023 09:23, VENTRICULAR PACED RHYTHM replaced sinus rhythm      Recent Labs: 04/08/2023: TSH 0.971 04/13/2023: Magnesium 2.0 05/30/2023: ALT 25; Hemoglobin 11.7; Platelets 243 06/14/2023: BUN 13; Creatinine, Ser 0.83; Potassium 3.6; Sodium 127    Lipid Panel    Component Value Date/Time   CHOL 149 12/29/2022 1515   TRIG 65.0 12/29/2022 1515   HDL 66.10 12/29/2022 1515   CHOLHDL 2 12/29/2022 1515  VLDL 13.0 12/29/2022 1515   LDLCALC 70 12/29/2022 1515   LDLDIRECT 72.0 12/29/2022 1515      Wt Readings from Last 3 Encounters:  06/21/23 162 lb 6 oz (73.7 kg)  06/14/23 166 lb 3.2 oz (75.4 kg)  06/07/23 173 lb 3.2 oz (78.6 kg)       ASSESSMENT AND PLAN:  1.  Tachybradycardia syndrome status post pacemaker placement: Continue small dose metoprolol.  2.  Permanent atrial fibrillation: Ventricular rate is controlled.  Continue anticoagulation with Eliquis.  3. Essential hypertension: Blood pressure is controlled on small dose metoprolol.  4. History of tachycardia-induced cardiomyopathy.   Most recent echocardiogram showed normal LV systolic function.  5.  Carotid artery disease: Carotid Doppler in April of this year showed moderate left carotid stenosis.  6.  Hyperlipidemia: Continue treatment with pravastatin.  Most recent LDL was 70.  7.  Chronic diastolic heart failure: She had volume overload recently but that responded to diuresis.  Continue torsemide 20 mg daily.  8.  Progressive dementia: She is losing memory fast and she is not oriented today.  This is very unusual for her.  I am referring her to neurology for evaluation.   Disposition:   FU with me in 4 months.   Signed, Lorine Bears, MD 06/21/23 [facility] [place], [place] [phone]

## 2023-06-21 NOTE — Telephone Encounter (Signed)
Spoke with pt's husband to let him know that pt could stop the buspirone and that alprazolam was sent in for pt to take if needed to help with sleep. Husband stated that pt saw Dr. Thane Edu and referred her to the neurologist at [facility] for possible dementia. He stated that pt's memory has changed a lot in the last 6 months.

## 2023-06-21 NOTE — Patient Instructions (Signed)
Medication Instructions:  No changes *If you need a refill on your cardiac medications before your next appointment, please call your pharmacy*   Lab Work: None ordered If you have labs (blood work) drawn today and your tests are completely normal, you will receive your results only by: MyChart Message (if you have MyChart) OR A paper copy in the mail If you have any lab test that is abnormal or we need to change your treatment, we will call you to review the results.   Testing/Procedures: None ordered   Follow-Up: At [facility], you and your health needs are our priority.  As part of our continuing mission to provide you with exceptional heart care, we have created designated Provider Care Teams.  These Care Teams include your primary Cardiologist (physician) and Advanced Practice Providers (APPs -  Physician Assistants and Nurse Practitioners) who all work together to provide you with the care you need, when you need it.  We recommend signing up for the patient portal called "MyChart".  Sign up information is provided on this After Visit Summary.  MyChart is used to connect with patients for Virtual Visits (Telemedicine).  Patients are able to view lab/test results, encounter notes, upcoming appointments, etc.  Non-urgent messages can be sent to your provider as well.   To learn more about what you can do with MyChart, go to [email or web address].    Your next appointment:   4 month(s)  Provider:   You may see Lorine Bears, MD or one of the following Advanced Practice Providers on your designated Care Team:   Nicolasa Ducking, NP Eula Listen, PA-C Cadence Fransico Michael, PA-C Charlsie Quest, NP    Other Instructions A referral has been placed to neurology

## 2023-06-24 ENCOUNTER — Telehealth: Payer: Self-pay | Admitting: Cardiovascular Disease

## 2023-06-24 NOTE — Telephone Encounter (Signed)
Called patient spoke with husband, advised that referral was placed, they stated they wanted [place] to call to schedule, advised that once referral is placed they should contact them to schedule, but I did give number to the office to patients husband to call them as well.  Patient husband verbalized understanding.

## 2023-06-24 NOTE — Telephone Encounter (Signed)
Patient's husband is requesting [name] [name] give him a call in regards to an appt he would like to schedule for neurology. Please advise.

## 2023-06-27 ENCOUNTER — Other Ambulatory Visit: Payer: Self-pay | Admitting: Internal Medicine

## 2023-06-27 MED ORDER — DONEPEZIL HCL 5 MG PO TBDP
5.0000 mg | ORAL_TABLET | Freq: Every day | ORAL | 2 refills | Status: DC
Start: 2023-06-27 — End: 2024-01-24

## 2023-07-11 ENCOUNTER — Ambulatory Visit (INDEPENDENT_AMBULATORY_CARE_PROVIDER_SITE_OTHER): Payer: Medicare Other | Admitting: Internal Medicine

## 2023-07-11 ENCOUNTER — Encounter: Payer: Self-pay | Admitting: Internal Medicine

## 2023-07-11 VITALS — BP 132/64 | HR 55 | Ht 64.0 in | Wt 157.4 lb

## 2023-07-11 DIAGNOSIS — F32A Depression, unspecified: Secondary | ICD-10-CM | POA: Diagnosis not present

## 2023-07-11 DIAGNOSIS — F03B4 Unspecified dementia, moderate, with anxiety: Secondary | ICD-10-CM

## 2023-07-11 DIAGNOSIS — F419 Anxiety disorder, unspecified: Secondary | ICD-10-CM

## 2023-07-11 DIAGNOSIS — F01B4 Vascular dementia, moderate, with anxiety: Secondary | ICD-10-CM

## 2023-07-11 DIAGNOSIS — E871 Hypo-osmolality and hyponatremia: Secondary | ICD-10-CM

## 2023-07-11 MED ORDER — BUSPIRONE HCL 10 MG PO TABS: 10.0000 mg | ORAL_TABLET | Freq: Two times a day (BID) | ORAL | 2 refills | Status: DC

## 2023-07-11 NOTE — Progress Notes (Unsigned)
Subjective:  Patient ID: [name] [name], female    DOB: 10-15-1936  Age: 87 y.o. MRN: [id number]  CC: {There were no encounter diagnoses. (Refresh or delete this SmartLink)}   HPI [name] [name] presents for  Chief Complaint  Patient presents with   Medical Management of Chronic Issues    1) Dementia:   HPI:  cognitive changes noted by husband in Jan 2024 with apraxia and forgetting names.  . Per husband,  cognitive deficits worsened following her pacemaker implantation  in May .  seen iin June initial trial of SSRI  treatment of pseudodementia  with  positive depression and anxiety  screen,    MMSE of 9  .  lexapro trial was stopped after 3 doses due to intolerance . CT head was done in ER in April for AMS ., no strokes seen.  MRI C/I due to pacemaker implantation Aricept started on July 8 after patient was seen by cardiology and referred to neurology,  appt Sept 19.  She is brought in today by her husband [name],  who states that the aricept is "not working"   He has noticed increased anxiety  over the last decade.  They have been married Over 50 years. Mother died when she was 89 and father abandoned her.  She was the 2nd youngest of 7 ,  and was raised by various relatives .   Discussed husband's impatience and increased fatigue due to caregiving .  Eating out  most meals.  She is very talkative and easily agitated,  cries a lot.  [place] have no support,  [place]  Discussed a   CCM consult .  They have one son who is too busy to help.    She is forgetting to bathe.  Outpatient Medications Prior to Visit  Medication Sig Dispense Refill   acetaminophen (TYLENOL) 500 MG tablet Take 1,000 mg by mouth every 6 (six) hours as needed (pain).      aluminum-magnesium hydroxide 200-200 MG/5ML suspension Take 20 mLs by mouth every 6 (six) hours as needed for indigestion.      amoxicillin (AMOXIL) 500 MG capsule Take 500 mg by mouth every 6 (six) hours.     apixaban (ELIQUIS)  5 MG TABS tablet TAKE ONE TABLET TWICE DAILY 60 tablet 5   Carboxymethylcellul-Glycerin (LUBRICATING EYE DROPS OP) Place 1 drop into both eyes daily as needed (dry eyes).     cholecalciferol (VITAMIN D) 1000 units tablet Take 1,000 Units by mouth daily.     Coenzyme Q10 (COQ10) 200 MG CAPS Take 200 mg by mouth daily.     donepezil (ARICEPT ODT) 5 MG disintegrating tablet Take 1 tablet (5 mg total) by mouth at bedtime. 90 tablet 2   methocarbamol (ROBAXIN) 500 MG tablet Take 1 tablet (500 mg total) by mouth at bedtime. 20 tablet 0   metoprolol tartrate (LOPRESSOR) 25 MG tablet Take 0.5 tablets (12.5 mg total) by mouth 2 (two) times daily. 90 tablet 1   pantoprazole (PROTONIX) 40 MG tablet TAKE 1 TABLET BY MOUTH DAILY 90 tablet 1   potassium chloride SA (KLOR-CON M20) 20 MEQ tablet Take 1 tablet (20 mEq total) by mouth daily. 90 tablet 3   pravastatin (PRAVACHOL) 20 MG tablet TAKE 1 TABLET BY MOUTH DAILY 90 tablet 0   Probiotic Product (PROBIOTIC ADVANCED PO) Take 1 tablet by mouth daily.     sodium chloride (OCEAN) 0.65 % SOLN nasal spray Place 1 spray into both  nostrils daily as needed for congestion.     torsemide (DEMADEX) 20 MG tablet Take 1 tablet (20 mg total) by mouth daily. 90 tablet 3   ALPRAZolam (XANAX) 0.25 MG tablet Take 1 tablet (0.25 mg total) by mouth 2 (two) times daily as needed for anxiety. (Patient not taking: Reported on 07/11/2023) 20 tablet 0   No facility-administered medications prior to visit.    Review of Systems;  Patient denies headache, fevers, malaise, unintentional weight loss, skin rash, eye pain, sinus congestion and sinus pain, sore throat, dysphagia,  hemoptysis , cough, dyspnea, wheezing, chest pain, palpitations, orthopnea, edema, abdominal pain, nausea, melena, diarrhea, constipation, flank pain, dysuria, hematuria, urinary  Frequency, nocturia, numbness, tingling, seizures,  Focal weakness, Loss of consciousness,  Tremor, insomnia, depression, anxiety, and  suicidal ideation.      Objective:  BP 132/64   Pulse (!) 55   Ht 5\' 4"  (1.626 m)   Wt 157 lb 6.4 oz (71.4 kg)   SpO2 96%   BMI 27.02 kg/m   BP Readings from Last 3 Encounters:  07/11/23 132/64  06/21/23 130/74  06/14/23 132/60    Wt Readings from Last 3 Encounters:  07/11/23 157 lb 6.4 oz (71.4 kg)  06/21/23 162 lb 6 oz (73.7 kg)  06/14/23 166 lb 3.2 oz (75.4 kg)    Physical Exam  Lab Results  Component Value Date   HGBA1C 6.5 12/29/2022   HGBA1C 6.5 05/26/2022   HGBA1C 6.0 (A) 02/04/2021    Lab Results  Component Value Date   CREATININE 0.83 06/14/2023   CREATININE 0.85 05/30/2023   CREATININE 0.84 04/30/2023    Lab Results  Component Value Date   WBC 5.9 05/30/2023   HGB 11.7 (L) 05/30/2023   HCT 37.1 05/30/2023   PLT 243 05/30/2023   GLUCOSE 155 (H) 06/14/2023   CHOL 149 12/29/2022   TRIG 65.0 12/29/2022   HDL 66.10 12/29/2022   LDLDIRECT 72.0 12/29/2022   LDLCALC 70 12/29/2022   ALT 25 05/30/2023   AST 32 05/30/2023   NA 127 (L) 06/14/2023   K 3.6 06/14/2023   CL 93 (L) 06/14/2023   CREATININE 0.83 06/14/2023   BUN 13 06/14/2023   CO2 26 06/14/2023   TSH 0.971 04/08/2023   INR 1.3 02/12/2019   HGBA1C 6.5 12/29/2022   MICROALBUR <0.7 04/29/2023    No results found.  Assessment & Plan:  .There are no diagnoses linked to this encounter.   I provided 30 minutes of face-to-face time during this encounter reviewing patient's last visit with me, patient's  most recent visit with cardiology,  nephrology,  and neurology,  recent surgical and non surgical procedures, previous  labs and imaging studies, counseling on currently addressed issues,  and post visit ordering to diagnostics and therapeutics .   Follow-up: No follow-ups on file.   Sherlene Shams, MD

## 2023-07-11 NOTE — Patient Instructions (Addendum)
[name] [name] needs to Continue  taking donepezil  at bedtime for her  memory.  We will increase the dose after one month to 10 mg daily    I am adding buspirone for her anxiety.  Take at two times daily ,  at breakfast and at dinnertime   I am going to try to get you some help at home.  I have ordered a consult from our "chronic care team" which will include a nurse and a Child psychotherapist

## 2023-07-12 NOTE — Assessment & Plan Note (Signed)
Chronic.  Encouarge liberalization of salt in diet with Gatorade Lab Results  Component Value Date   NA 127 (L) 06/14/2023   K 3.6 06/14/2023   CL 93 (L) 06/14/2023   CO2 26 06/14/2023

## 2023-07-12 NOTE — Assessment & Plan Note (Addendum)
Cognitive decline noted in January by hsuband and has become progressive.  CCM consult for nursing and social work.  Continue aricept 5 mg at bedtime for 30 days,  then increase to 10 mg .  Appt with neurology in mid Sept.  Serologies for deficiencies negative   Lab Results  Component Value Date   TSH 0.971 04/08/2023   Last vitamin B12 and Folate Lab Results  Component Value Date   VITAMINB12 418 12/29/2022   FOLATE >23.8 12/29/2022

## 2023-07-12 NOTE — Assessment & Plan Note (Signed)
Aggravated by her cognitive decline due to dementia.  Recommend a repeat trial  of buspirone starting with 10 mg twice daily

## 2023-07-14 ENCOUNTER — Ambulatory Visit (INDEPENDENT_AMBULATORY_CARE_PROVIDER_SITE_OTHER): Payer: Medicare Other

## 2023-07-14 DIAGNOSIS — I495 Sick sinus syndrome: Secondary | ICD-10-CM

## 2023-07-14 LAB — CUP PACEART REMOTE DEVICE CHECK
Battery Remaining Longevity: 110 mo
Battery Remaining Percentage: 95.5 %
Battery Voltage: 3.02 V
Brady Statistic RV Percent Paced: 58 %
Date Time Interrogation Session: 20240725021428
Implantable Lead Connection Status: 753985
Implantable Lead Implant Date: 20240424
Implantable Lead Location: 753860
Implantable Lead Model: 1948
Implantable Pulse Generator Implant Date: 20240424
Lead Channel Impedance Value: 700 Ohm
Lead Channel Pacing Threshold Amplitude: 0.5 V
Lead Channel Pacing Threshold Pulse Width: 0.5 ms
Lead Channel Sensing Intrinsic Amplitude: 9.4 mV
Lead Channel Setting Pacing Amplitude: 3.5 V
Lead Channel Setting Pacing Pulse Width: 0.5 ms
Lead Channel Setting Sensing Sensitivity: 2 mV
Pulse Gen Model: 1272
Pulse Gen Serial Number: 8124077

## 2023-07-19 ENCOUNTER — Telehealth: Payer: Self-pay | Admitting: Internal Medicine

## 2023-07-19 ENCOUNTER — Ambulatory Visit: Payer: Medicare Other | Attending: Internal Medicine | Admitting: Internal Medicine

## 2023-07-19 ENCOUNTER — Encounter: Payer: Self-pay | Admitting: Internal Medicine

## 2023-07-19 VITALS — BP 160/64 | HR 49 | Ht 64.0 in | Wt 157.8 lb

## 2023-07-19 DIAGNOSIS — I4821 Permanent atrial fibrillation: Secondary | ICD-10-CM | POA: Diagnosis not present

## 2023-07-19 DIAGNOSIS — I495 Sick sinus syndrome: Secondary | ICD-10-CM

## 2023-07-19 DIAGNOSIS — Z950 Presence of cardiac pacemaker: Secondary | ICD-10-CM | POA: Diagnosis not present

## 2023-07-19 MED ORDER — METOPROLOL TARTRATE 25 MG PO TABS: 25.0000 mg | ORAL_TABLET | Freq: Two times a day (BID) | ORAL | 2 refills | Status: DC

## 2023-07-19 NOTE — Telephone Encounter (Signed)
Patient husband called stating he tried calling the breast center to schedule an appt as recommended by Dr. Graciela Husbands, and they advised him we need the call the breast center to set up an appt for her to have a breast checked.

## 2023-07-19 NOTE — Patient Instructions (Signed)
Medication Instructions:  Increase Metoprolol to 25 mg twice daily   *If you need a refill on your cardiac medications before your next appointment, please call your pharmacy*   Follow-Up: At [facility], you and your health needs are our priority.  As part of our continuing mission to provide you with exceptional heart care, we have created designated Provider Care Teams.  These Care Teams include your primary Cardiologist (physician) and Advanced Practice Providers (APPs -  Physician Assistants and Nurse Practitioners) who all work together to provide you with the care you need, when you need it.  We recommend signing up for the patient portal called "MyChart".  Sign up information is provided on this After Visit Summary.  MyChart is used to connect with patients for Virtual Visits (Telemedicine).  Patients are able to view lab/test results, encounter notes, upcoming appointments, etc.  Non-urgent messages can be sent to your provider as well.   To learn more about what you can do with MyChart, go to [email or web address].    Your next appointment:   9 month(s)  Provider:   You will see one of the following Advanced Practice Providers on your designated Care Team:    Sherie Don, NP

## 2023-07-19 NOTE — Progress Notes (Signed)
Patient Care Team: Sherlene Shams, MD as PCP - General (Internal Medicine) Iran Ouch, MD as PCP - Cardiology (Cardiology) Duke Salvia, MD as PCP - Electrophysiology (Cardiology) Iran Ouch, MD as Consulting Physician (Cardiology)   HPI  [name] [name] is a 87 y.o. female seen in followup for pacemaker-single chamber (passive lead)  Abbott  implanted 4/24 for syncope, AFib-permanent with tachybrady, the latter requiring atropine.  Hx of tachyassoc cardiomyopathy, now resolved.   Dementia  DATE TEST EF   9/23 MYOVIEW   57 % No ischemia  4/24 Echo  60% TR mild/mod  6/24 Echo   55-60 % BAE severe TRmod/severe               Date Cr K Hgb  6/24 0.83 3.9 11.7<<12.9           Thromboembolic risk factors ( age  -2, HTN-1, Gender-1) for a CHADSVASc Score of >=4    Records and Results Reviewed***  Past Medical History:  Diagnosis Date   Actinic keratosis    Arthritis    Atrial flutter (HCC) 02/2011   s/p cardioversion    Chest pain    a. H/o cardiac cath x 2-> 2012 -->nl cors;  b. 12/2016 MV: EF 61%, small region of mild perfusion defect in the apical anteroseptal region c/w breast attenuation, no ischemia-->Low risk; c. 06/2019 MV: Mod size, mild inflat ischemia, EF 59%. Cor and Ao Ca2+. Inflat defect more pronounced on this study compared to last; c. 07/2019 Cath: LM nl, LAD min irregs, LCX nl, OM1/2/3 nl, RCA min irregs.   CKD (chronic kidney disease), stage III (HCC)    Concussion with no loss of consciousness 09/01/2016   Cystocele    Degenerative disorder of bone    GERD (gastroesophageal reflux disease)    Headache(784.0)    chronic   Hiatal hernia    Hyperlipidemia    Hypertension    Influenza 01/10/2017   Knee fracture    Migraines    Mobitz type 2 second degree atrioventricular block    a. felt to be 2/2 amiodarone, resolved with decreased amiodarone dose.  Amio since d/c'd.   Permanent atrial fibrillation (HCC)    a. status post  multiple DCCVs; b. 2018 - eval for PVI but opted for rate control.   PONV (postoperative nausea and vomiting)    oxycodone and codiene cause N/V    Pre-syncope    a. In setting of dehydration and AKI in the past.   Sleep apnea    Squamous cell carcinoma of skin 11/10/2020   left distal posterior deltoid (EDC 01/15/2021)   Tachycardia induced cardiomyopathy (HCC)    a. Resolved;  b. 08/2017 Echo: EF 50-55%, no rwma, mild MR, mildly to mod dil LA/RA; c. 02/2018 Echo: EF 55-60%, mild MR. Mildly dil LA. Nl RVSP. PASP .   Venous insufficiency    Vertigo     Past Surgical History:  Procedure Laterality Date   ABDOMINAL HYSTERECTOMY  1990   APPENDECTOMY     AUGMENTATION MAMMAPLASTY Bilateral 1986   implants   AUGMENTATION MAMMAPLASTY  1990   AUGMENTATION MAMMAPLASTY  2011   CARDIAC CATHETERIZATION     CARDIOVERSION     x 3   CARDIOVERSION     CARDIOVERSION N/A 02/07/2017   Procedure: CARDIOVERSION;  Surgeon: Iran Ouch, MD;  Location: [facility];  Service: Cardiovascular;  Laterality: N/A;   CARDIOVERSION N/A 07/22/2017   Procedure:  Cardioversion;  Surgeon: Antonieta Iba, MD;  Location: [facility];  Service: Cardiovascular;  Laterality: N/A;   CATARACT EXTRACTION W/PHACO Right 09/21/2016   Procedure: CATARACT EXTRACTION PHACO AND INTRAOCULAR LENS PLACEMENT (IOC);  Surgeon: Galen Manila, MD;  Location: [facility];  Service: Ophthalmology;  Laterality: Right;  [place] 44.1 AP% 16.5 CDE 7.30 Fluid Pack Lot #[id number]   CATARACT EXTRACTION W/PHACO Left 10/19/2016   Procedure: CATARACT EXTRACTION PHACO AND INTRAOCULAR LENS PLACEMENT (IOC);  Surgeon: Galen Manila, MD;  Location: [facility];  Service: Ophthalmology;  Laterality: Left;  [place] 53.7 AP% 19.5 CDE 10.45 Fluid pack lot # [id number]   CHOLECYSTECTOMY     COMBINED AUGMENTATION MAMMAPLASTY AND ABDOMINOPLASTY     JOINT REPLACEMENT Left 06/04/2013   left knee   KNEE ARTHROSCOPY Right 08/16/2016   Procedure: ARTHROSCOPY KNEE, tear  posterior horn medial meniscus, tear anterior and posterior horns of lateral meniscus, chondromalacia of lateral compartment grade 3 patella and grade 4 medial;  Surgeon: Donato Heinz, MD;  Location: [facility];  Service: Orthopedics;  Laterality: Right;   LEFT HEART CATH AND CORONARY ANGIOGRAPHY Left 07/27/2019   Procedure: LEFT HEART CATH AND CORONARY ANGIOGRAPHY;  Surgeon: Iran Ouch, MD;  Location: [facility];  Service: Cardiovascular;  Laterality: Left;   MASTECTOMY  1986   nipple sparing mastectomy/Bilateral with silicone  breast implants, s/p saline replacements   Multiple orthopedic procedures     NOSE SURGERY     PACEMAKER IMPLANT N/A 04/13/2023   Procedure: PACEMAKER IMPLANT;  Surgeon: Duke Salvia, MD;  Location: [facility];  Service: Cardiovascular;  Laterality: N/A;   TEE WITHOUT CARDIOVERSION N/A 09/26/2017   Procedure: TRANSESOPHAGEAL ECHOCARDIOGRAM (TEE);  Surgeon: Pricilla Riffle, MD;  Location: [facility] ENDOS[facility];  Service: Cardiovascular;  Laterality: N/A;   TOTAL KNEE ARTHROPLASTY Left     Current Meds  Medication Sig   acetaminophen (TYLENOL) 500 MG tablet Take 1,000 mg by mouth every 6 (six) hours as needed (pain).    aluminum-magnesium hydroxide 200-200 MG/5ML suspension Take 20 mLs by mouth every 6 (six) hours as needed for indigestion.    amoxicillin (AMOXIL) 500 MG capsule Take 500 mg by mouth every 6 (six) hours.   apixaban (ELIQUIS) 5 MG TABS tablet TAKE ONE TABLET TWICE DAILY   Carboxymethylcellul-Glycerin (LUBRICATING EYE DROPS OP) Place 1 drop into both eyes daily as needed (dry eyes).   cholecalciferol (VITAMIN D) 1000 units tablet Take 1,000 Units by mouth daily.   Coenzyme Q10 (COQ10) 200 MG CAPS Take 200 mg by mouth daily.   donepezil (ARICEPT ODT) 5 MG disintegrating tablet Take 1 tablet (5 mg total) by mouth at bedtime.   methocarbamol (ROBAXIN) 500 MG tablet Take 1 tablet (500 mg total) by mouth at bedtime.   metoprolol tartrate  (LOPRESSOR) 25 MG tablet Take 0.5 tablets (12.5 mg total) by mouth 2 (two) times daily.   pantoprazole (PROTONIX) 40 MG tablet TAKE 1 TABLET BY MOUTH DAILY   potassium chloride SA (KLOR-CON M20) 20 MEQ tablet Take 1 tablet (20 mEq total) by mouth daily.   pravastatin (PRAVACHOL) 20 MG tablet TAKE 1 TABLET BY MOUTH DAILY   Probiotic Product (PROBIOTIC ADVANCED PO) Take 1 tablet by mouth daily.   sodium chloride (OCEAN) 0.65 % SOLN nasal spray Place 1 spray into both nostrils daily as needed for congestion.   torsemide (DEMADEX) 20 MG tablet Take 1 tablet (20 mg total) by mouth daily.    Allergies  Allergen Reactions  Cyclobenzaprine Other (See Comments)    Dropped blood pressure and heart rate, "talking out of my mind"   Iodine Anaphylaxis    NO PROBLEMS WITH BETADINE   Amiodarone     Cannot remember    Biaxin [Clarithromycin] Nausea And Vomiting   Codeine Nausea And Vomiting   Digoxin And Related Other (See Comments)    Fatigue, eye puffiness, hoarsness   Enalapril Other (See Comments)    unknown   Famotidine     Caused dizziness   Fluocinonide Other (See Comments)    Tingling sensation in head and redness to scalp.   Iodinated Contrast Media Other (See Comments)    Tachycardia   Iodine-131    Losartan Potassium Other (See Comments)    Dizzy and headache   Omnicef [Cefdinir]    Oxycodone Nausea And Vomiting   Promethazine Other (See Comments)    Unknown    Sertraline     Dizziness and weakness   Tizanidine Hcl Other (See Comments)    hypotension    Warfarin And Related     Bleeding    Xarelto [Rivaroxaban] Other (See Comments)    bleeding      Review of Systems negative except from HPI and PMH  Physical Exam BP (!) 160/64   Pulse (!) 49   Ht 5\' 4"  (1.626 m)   Wt 157 lb 12.8 oz (71.6 kg)   SpO2 97%   BMI 27.09 kg/m  Well developed and well nourished in no acute distress HENT normal E scleral and icterus clear Neck Supple JVP flat; carotids brisk and  full Device pocket well healed; without hematoma or erythema.  There is no tethering Clear to ausculation Regular rate and rhythm, no murmurs gallops or rub Soft with active bowel sounds No clubbing cyanosis  Edema Alert and oriented, grossly normal motor and sensory function Skin Warm and Dry   ECG atrial fibrillation with underlying competitive ventricular pacing QRSd 148    Device function is normal. Programming changes LRL 50>>60  See Paceart for details    CrCl cannot be calculated (Patient's most recent lab result is older than the maximum 21 days allowed.).   Assessment and  Plan AFlutter  tachy brady   Syncope      Dementia   Pacemaker Abbott   Cardiomyopathy, tachymediated  intercurrent resolution  Hypertension   BP is elevatedbut numbers from home 115-145; with Afib>>170s will increase her metoprolol 12.5>>25 bid   No interval syncope  With bradycardia, will increase LRL  (As above)   No bleeding   continue Apixaban  odsed for weight and renal function    Current medicines are reviewed at length with the patient today .  The patient does not*** have concerns regarding medicines.

## 2023-07-20 ENCOUNTER — Encounter: Payer: Self-pay | Admitting: Internal Medicine

## 2023-07-21 ENCOUNTER — Ambulatory Visit: Payer: Medicare Other | Admitting: Nurse Practitioner

## 2023-07-21 ENCOUNTER — Encounter: Payer: Self-pay | Admitting: Nurse Practitioner

## 2023-07-21 ENCOUNTER — Telehealth: Payer: Self-pay

## 2023-07-21 VITALS — BP 130/70 | HR 85 | Temp 97.5°F | Ht 64.0 in | Wt 155.2 lb

## 2023-07-21 DIAGNOSIS — N6313 Unspecified lump in the right breast, lower outer quadrant: Secondary | ICD-10-CM | POA: Diagnosis not present

## 2023-07-21 DIAGNOSIS — N6321 Unspecified lump in the left breast, upper outer quadrant: Secondary | ICD-10-CM | POA: Diagnosis not present

## 2023-07-21 DIAGNOSIS — N63 Unspecified lump in unspecified breast: Secondary | ICD-10-CM

## 2023-07-21 NOTE — Telephone Encounter (Signed)
LVM on pt phone to call back to inform her that they scheduled Her Mammogram on Mon 07/25/23 @ 2:20 pm!

## 2023-07-21 NOTE — Telephone Encounter (Signed)
Pt husband called back and I told the husband about pt appointment on 8/5

## 2023-07-21 NOTE — Telephone Encounter (Signed)
Spoke with pt's husband, DPR who states pt has an appointment today with PCP for breast exam and possible referral to breast center.  Pt's husband thanked Charity fundraiser for the call.  Nothing further needed at this time.

## 2023-07-21 NOTE — Progress Notes (Signed)
Established Patient Office Visit  Subjective:  Patient ID: [name] [name], female    DOB: 10/20/1936  Age: 87 y.o. MRN: [id number]  CC:  Chief Complaint  Patient presents with   Breast Mass    Left breast has knots    HPI  [name] presents for off and on pain in the left breast from couple of days.  Patient has retropectoral implant.  HPI   Past Medical History:  Diagnosis Date   Actinic keratosis    Arthritis    Atrial flutter (HCC) 02/2011   s/p cardioversion    Chest pain    a. H/o cardiac cath x 2-> 2012 -->nl cors;  b. 12/2016 MV: EF 61%, small region of mild perfusion defect in the apical anteroseptal region c/w breast attenuation, no ischemia-->Low risk; c. 06/2019 MV: Mod size, mild inflat ischemia, EF 59%. Cor and Ao Ca2+. Inflat defect more pronounced on this study compared to last; c. 07/2019 Cath: LM nl, LAD min irregs, LCX nl, OM1/2/3 nl, RCA min irregs.   CKD (chronic kidney disease), stage III (HCC)    Concussion with no loss of consciousness 09/01/2016   Cystocele    Degenerative disorder of bone    GERD (gastroesophageal reflux disease)    Headache(784.0)    chronic   Hiatal hernia    Hyperlipidemia    Hypertension    Influenza 01/10/2017   Knee fracture    Migraines    Mobitz type 2 second degree atrioventricular block    a. felt to be 2/2 amiodarone, resolved with decreased amiodarone dose.  Amio since d/c'd.   Permanent atrial fibrillation (HCC)    a. status post multiple DCCVs; b. 2018 - eval for PVI but opted for rate control.   PONV (postoperative nausea and vomiting)    oxycodone and codiene cause N/V    Pre-syncope    a. In setting of dehydration and AKI in the past.   Sleep apnea    Squamous cell carcinoma of skin 11/10/2020   left distal posterior deltoid (EDC 01/15/2021)   Tachycardia induced cardiomyopathy (HCC)    a. Resolved;  b. 08/2017 Echo: EF 50-55%, no rwma, mild MR, mildly to mod dil LA/RA; c. 02/2018 Echo: EF 55-60%, mild  MR. Mildly dil LA. Nl RVSP. PASP .   Venous insufficiency    Vertigo     Past Surgical History:  Procedure Laterality Date   ABDOMINAL HYSTERECTOMY  1990   APPENDECTOMY     AUGMENTATION MAMMAPLASTY Bilateral 1986   implants   AUGMENTATION MAMMAPLASTY  1990   AUGMENTATION MAMMAPLASTY  2011   CARDIAC CATHETERIZATION     CARDIOVERSION     x 3   CARDIOVERSION     CARDIOVERSION N/A 02/07/2017   Procedure: CARDIOVERSION;  Surgeon: Iran Ouch, MD;  Location: [facility];  Service: Cardiovascular;  Laterality: N/A;   CARDIOVERSION N/A 07/22/2017   Procedure: Cardioversion;  Surgeon: Antonieta Iba, MD;  Location: [facility];  Service: Cardiovascular;  Laterality: N/A;   CATARACT EXTRACTION W/PHACO Right 09/21/2016   Procedure: CATARACT EXTRACTION PHACO AND INTRAOCULAR LENS PLACEMENT (IOC);  Surgeon: Galen Manila, MD;  Location: [facility];  Service: Ophthalmology;  Laterality: Right;  [place] 44.1 AP% 16.5 CDE 7.30 Fluid Pack Lot #[id number]   CATARACT EXTRACTION W/PHACO Left 10/19/2016   Procedure: CATARACT EXTRACTION PHACO AND INTRAOCULAR LENS PLACEMENT (IOC);  Surgeon: Galen Manila, MD;  Location: [facility];  Service: Ophthalmology;  Laterality: Left;  [place] 53.7 AP% 19.5 CDE  10.45 Fluid pack lot # [id number]   CHOLECYSTECTOMY     COMBINED AUGMENTATION MAMMAPLASTY AND ABDOMINOPLASTY     JOINT REPLACEMENT Left 06/04/2013   left knee   KNEE ARTHROSCOPY Right 08/16/2016   Procedure: ARTHROSCOPY KNEE, tear posterior horn medial meniscus, tear anterior and posterior horns of lateral meniscus, chondromalacia of lateral compartment grade 3 patella and grade 4 medial;  Surgeon: Donato Heinz, MD;  Location: [facility];  Service: Orthopedics;  Laterality: Right;   LEFT HEART CATH AND CORONARY ANGIOGRAPHY Left 07/27/2019   Procedure: LEFT HEART CATH AND CORONARY ANGIOGRAPHY;  Surgeon: Iran Ouch, MD;  Location: [facility];  Service: Cardiovascular;  Laterality: Left;   MASTECTOMY   1986   nipple sparing mastectomy/Bilateral with silicone  breast implants, s/p saline replacements   Multiple orthopedic procedures     NOSE SURGERY     PACEMAKER IMPLANT N/A 04/13/2023   Procedure: PACEMAKER IMPLANT;  Surgeon: Duke Salvia, MD;  Location: [facility];  Service: Cardiovascular;  Laterality: N/A;   TEE WITHOUT CARDIOVERSION N/A 09/26/2017   Procedure: TRANSESOPHAGEAL ECHOCARDIOGRAM (TEE);  Surgeon: Pricilla Riffle, MD;  Location: [facility] ENDOSCOPY;  Service: Cardiovascular;  Laterality: N/A;   TOTAL KNEE ARTHROPLASTY Left     Family History  Problem Relation Age of Onset   Heart disease Mother    Stomach cancer Father    Breast cancer Sister 8   Breast cancer Sister 22   Breast cancer Sister 84   Breast cancer Sister    Breast cancer Maternal Grandmother    Diabetes Brother    Esophageal cancer Brother    Kidney failure Brother    Heart disease Son        found at autopsy   Ovarian cancer Neg Hx     Social History   Socioeconomic History   Marital status: Married    Spouse name: Not on file   Number of children: 1   Years of education: Not on file   Highest education level: Not on file  Occupational History    Employer: RETIRED  Tobacco Use   Smoking status: Never   Smokeless tobacco: Never  Vaping Use   Vaping status: Never Used  Substance and Sexual Activity   Alcohol use: No   Drug use: No   Sexual activity: Never    Birth control/protection: Surgical  Other Topics Concern   Not on file  Social History Narrative   Married    Social Determinants of Health   Financial Resource Strain: Low Risk  (05/06/2022)   Overall Financial Resource Strain (CARDIA)    Difficulty of Paying Living Expenses: Not hard at all  Food Insecurity: No Food Insecurity (04/15/2023)   Hunger Vital Sign    Worried About Running Out of Food in the Last Year: Never true    Ran Out of Food in the Last Year: Never true  Transportation Needs: No Transportation Needs  (04/15/2023)   PRAPARE - Administrator, Civil Service (Medical): No    Lack of Transportation (Non-Medical): No  Physical Activity: Insufficiently Active (05/06/2022)   Exercise Vital Sign    Days of Exercise per Week: 3 days    Minutes of Exercise per Session: 20 min  Stress: No Stress Concern Present (05/06/2022)   [employer] of Occupational Health - Occupational Stress Questionnaire    Feeling of Stress : Not at all  Social Connections: Unknown (05/06/2022)   Social Connection and Isolation Panel [NHANES]  Frequency of Communication with Friends and Family: Not on file    Frequency of Social Gatherings with Friends and Family: Not on file    Attends Religious Services: Not on file    Active Member of Clubs or Organizations: Not on file    Attends Banker Meetings: Not on file    Marital Status: Married  Intimate Partner Violence: Not At Risk (04/08/2023)   Humiliation, Afraid, Rape, and Kick questionnaire    Fear of Current or Ex-Partner: No    Emotionally Abused: No    Physically Abused: No    Sexually Abused: No     Outpatient Medications Prior to Visit  Medication Sig Dispense Refill   acetaminophen (TYLENOL) 500 MG tablet Take 1,000 mg by mouth every 6 (six) hours as needed (pain).      aluminum-magnesium hydroxide 200-200 MG/5ML suspension Take 20 mLs by mouth every 6 (six) hours as needed for indigestion.      amoxicillin (AMOXIL) 500 MG capsule Take 500 mg by mouth every 6 (six) hours.     apixaban (ELIQUIS) 5 MG TABS tablet TAKE ONE TABLET TWICE DAILY 60 tablet 5   busPIRone (BUSPAR) 10 MG tablet Take 1 tablet (10 mg total) by mouth 2 (two) times daily. 60 tablet 2   Carboxymethylcellul-Glycerin (LUBRICATING EYE DROPS OP) Place 1 drop into both eyes daily as needed (dry eyes).     cholecalciferol (VITAMIN D) 1000 units tablet Take 1,000 Units by mouth daily.     Coenzyme Q10 (COQ10) 200 MG CAPS Take 200 mg by mouth daily.     donepezil  (ARICEPT ODT) 5 MG disintegrating tablet Take 1 tablet (5 mg total) by mouth at bedtime. 90 tablet 2   methocarbamol (ROBAXIN) 500 MG tablet Take 1 tablet (500 mg total) by mouth at bedtime. 20 tablet 0   metoprolol tartrate (LOPRESSOR) 25 MG tablet Take 1 tablet (25 mg total) by mouth 2 (two) times daily. 180 tablet 2   pantoprazole (PROTONIX) 40 MG tablet TAKE 1 TABLET BY MOUTH DAILY 90 tablet 1   potassium chloride SA (KLOR-CON M20) 20 MEQ tablet Take 1 tablet (20 mEq total) by mouth daily. 90 tablet 3   Probiotic Product (PROBIOTIC ADVANCED PO) Take 1 tablet by mouth daily.     sodium chloride (OCEAN) 0.65 % SOLN nasal spray Place 1 spray into both nostrils daily as needed for congestion.     torsemide (DEMADEX) 20 MG tablet Take 1 tablet (20 mg total) by mouth daily. 90 tablet 3   pravastatin (PRAVACHOL) 20 MG tablet TAKE 1 TABLET BY MOUTH DAILY 90 tablet 0   ALPRAZolam (XANAX) 0.25 MG tablet Take 1 tablet (0.25 mg total) by mouth 2 (two) times daily as needed for anxiety. (Patient not taking: Reported on 07/11/2023) 20 tablet 0   No facility-administered medications prior to visit.    Allergies  Allergen Reactions   Cyclobenzaprine Other (See Comments)    Dropped blood pressure and heart rate, "talking out of my mind"   Iodine Anaphylaxis    NO PROBLEMS WITH BETADINE   Amiodarone     Cannot remember    Biaxin [Clarithromycin] Nausea And Vomiting   Codeine Nausea And Vomiting   Digoxin And Related Other (See Comments)    Fatigue, eye puffiness, hoarsness   Enalapril Other (See Comments)    unknown   Famotidine     Caused dizziness   Fluocinonide Other (See Comments)    Tingling sensation in head and redness to  scalp.   Iodinated Contrast Media Other (See Comments)    Tachycardia   Iodine-131    Losartan Potassium Other (See Comments)    Dizzy and headache   Omnicef [Cefdinir]    Oxycodone Nausea And Vomiting   Promethazine Other (See Comments)    Unknown    Sertraline      Dizziness and weakness   Tizanidine Hcl Other (See Comments)    hypotension    Warfarin And Related     Bleeding    Xarelto [Rivaroxaban] Other (See Comments)    bleeding    ROS Review of Systems Negative unless indicated in HPI.    Objective:    Physical Exam Constitutional:      Appearance: Normal appearance.  HENT:     Nose: Nose normal.  Cardiovascular:     Rate and Rhythm: Normal rate and regular rhythm.  Pulmonary:     Effort: Pulmonary effort is normal.     Breath sounds: Normal breath sounds.  Chest:       Comments: Lump on upper outer quadrant of left breast and  lower outer quadrant of right breast. Neurological:     Mental Status: She is alert and oriented to person, place, and time. Mental status is at baseline.  Psychiatric:        Mood and Affect: Mood normal.        Behavior: Behavior normal.     BP 130/70   Pulse 85   Temp (!) 97.5 F (36.4 C) (Oral)   Ht 5\' 4"  (1.626 m)   Wt 155 lb 3.2 oz (70.4 kg)   SpO2 96%   BMI 26.64 kg/m  Wt Readings from Last 3 Encounters:  07/21/23 155 lb 3.2 oz (70.4 kg)  07/19/23 157 lb 12.8 oz (71.6 kg)  07/11/23 157 lb 6.4 oz (71.4 kg)     Health Maintenance  Topic Date Due   OPHTHALMOLOGY EXAM  Never done   COVID-19 Vaccine (4 - 2023-24 season) 08/20/2022   Zoster Vaccines- Shingrix (2 of 2) 01/31/2023   Medicare Annual Wellness (AWV)  05/07/2023   HEMOGLOBIN A1C  06/29/2023   INFLUENZA VACCINE  03/19/2024 (Originally 07/21/2023)   FOOT EXAM  12/30/2023   Diabetic kidney evaluation - Urine ACR  04/28/2024   Diabetic kidney evaluation - eGFR measurement  06/13/2024   DTaP/Tdap/Td (4 - Td or Tdap) 11/30/2024   Pneumonia Vaccine 55+ Years old  Completed   DEXA SCAN  Completed   HPV VACCINES  Aged Out    There are no preventive care reminders to display for this patient.  Lab Results  Component Value Date   TSH 0.971 04/08/2023   Lab Results  Component Value Date   WBC 5.9 05/30/2023   HGB  11.7 (L) 05/30/2023   HCT 37.1 05/30/2023   MCV 97.6 05/30/2023   PLT 243 05/30/2023   Lab Results  Component Value Date   NA 127 (L) 06/14/2023   K 3.6 06/14/2023   CO2 26 06/14/2023   GLUCOSE 155 (H) 06/14/2023   BUN 13 06/14/2023   CREATININE 0.83 06/14/2023   BILITOT 2.0 (H) 05/30/2023   ALKPHOS 100 05/30/2023   AST 32 05/30/2023   ALT 25 05/30/2023   PROT 7.0 05/30/2023   ALBUMIN 3.9 05/30/2023   CALCIUM 8.9 06/14/2023   ANIONGAP 8 06/14/2023   GFR 78.94 04/29/2023   Lab Results  Component Value Date   CHOL 149 12/29/2022   Lab Results  Component Value Date  HDL 66.10 12/29/2022   Lab Results  Component Value Date   LDLCALC 70 12/29/2022   Lab Results  Component Value Date   TRIG 65.0 12/29/2022   Lab Results  Component Value Date   CHOLHDL 2 12/29/2022   Lab Results  Component Value Date   HGBA1C 6.5 12/29/2022      Assessment & Plan:  Mass of breast, unspecified laterality  Mass of upper outer quadrant of left breast -     [place] LIMITED ULTRASOUND INCLUDING AXILLA LEFT BREAST ; Future -     MM 3D DIAGNOSTIC MAMMOGRAM BILATERAL BREAST W/IMPLANT; Future  Mass of lower outer quadrant of right breast -     [place] LIMITED ULTRASOUND INCLUDING AXILLA RIGHT BREAST; Future -     MM 3D DIAGNOSTIC MAMMOGRAM BILATERAL BREAST W/IMPLANT; Future    Follow-up: No follow-ups on file.   Kara Dies, NP

## 2023-07-22 NOTE — Progress Notes (Signed)
Remote pacemaker transmission.   

## 2023-07-25 ENCOUNTER — Ambulatory Visit: Admission: RE | Admit: 2023-07-25 | Payer: Medicare Other | Source: Ambulatory Visit

## 2023-07-25 ENCOUNTER — Inpatient Hospital Stay: Admission: RE | Admit: 2023-07-25 | Payer: Medicare Other | Source: Ambulatory Visit

## 2023-07-25 ENCOUNTER — Ambulatory Visit
Admission: RE | Admit: 2023-07-25 | Discharge: 2023-07-25 | Disposition: A | Discharge status: Home / Self Care | Payer: Medicare Other | Source: Ambulatory Visit | Attending: Nurse Practitioner | Admitting: Nurse Practitioner

## 2023-07-25 DIAGNOSIS — N6321 Unspecified lump in the left breast, upper outer quadrant: Secondary | ICD-10-CM | POA: Insufficient documentation

## 2023-07-25 DIAGNOSIS — N6320 Unspecified lump in the left breast, unspecified quadrant: Secondary | ICD-10-CM | POA: Diagnosis not present

## 2023-07-25 DIAGNOSIS — R92323 Mammographic fibroglandular density, bilateral breasts: Secondary | ICD-10-CM | POA: Diagnosis not present

## 2023-07-25 DIAGNOSIS — N6313 Unspecified lump in the right breast, lower outer quadrant: Secondary | ICD-10-CM

## 2023-07-25 DIAGNOSIS — N644 Mastodynia: Secondary | ICD-10-CM | POA: Diagnosis not present

## 2023-07-26 ENCOUNTER — Other Ambulatory Visit: Payer: Self-pay | Admitting: Nurse Practitioner

## 2023-07-26 DIAGNOSIS — R928 Other abnormal and inconclusive findings on diagnostic imaging of breast: Secondary | ICD-10-CM

## 2023-08-01 ENCOUNTER — Other Ambulatory Visit: Payer: Self-pay | Admitting: Cardiovascular Disease

## 2023-08-01 ENCOUNTER — Telehealth: Payer: Self-pay

## 2023-08-01 DIAGNOSIS — I4821 Permanent atrial fibrillation: Secondary | ICD-10-CM

## 2023-08-01 DIAGNOSIS — I10 Essential (primary) hypertension: Secondary | ICD-10-CM

## 2023-08-01 NOTE — Progress Notes (Signed)
Care Management   Outreach Note  08/01/2023 Name: [name] [name] MRN: [id number] DOB: 09-05-1936  An unsuccessful telephone outreach was attempted today to contact the patient about Care Management needs.    Follow Up Plan:  A HIPAA compliant phone message was left for the patient providing contact information and requesting a return call.  The care management team will reach out to the patient again over the next 7 days.  If patient returns call to provider office, please advise to call Embedded Care Management Care Guide Penne Lash * at [phone] Lash, RMA Care Guide [facility]  [place], [place] [address] Direct Dial: [phone] [email or web address]

## 2023-08-02 ENCOUNTER — Telehealth: Payer: Self-pay

## 2023-08-02 NOTE — Progress Notes (Signed)
Patient returned call but did not understand why I was calling. Tried to explain to spouse and he could not hear one word that I was saying.  Penne Lash, RMA Care Guide [facility]  [place], [place] [address] Direct Dial: [phone] [email or web address]

## 2023-08-02 NOTE — Patient Outreach (Signed)
  Care Coordination   Initial Visit Note   08/02/2023 Name: [name] [name] MRN: [id number] DOB: [birth year]  [name] [name] is a 87 y.o. year old female who sees Darrick Huntsman, Mar Daring, MD for primary care. I  spoke with Mariana Arn spouse/ DPR.   What matters to the patients health and wellness today?  Referral from primary care provider to assist patient with needs due to having vascular dementia.  Spouse hard of hearing therefore unable to hear by phone to complete assessment.     Goals Addressed             This Visit's Progress    Obtaining assistance for resource needs and management of dementia       Interventions Today    Flowsheet Row Most Recent Value  Chronic Disease   Chronic disease during today's visit Other  [vascular dementia]  General Interventions   General Interventions Discussed/Reviewed [employer], Communication with  [Discussed care coordination.  Arranged to meet patient/ spouse in office on 08/08/23]  Communication with Social Work  Public house manager with Child psychotherapist regarding primary care provider referral.]              SDOH assessments and interventions completed:  No     Care Coordination Interventions:  Yes, provided   Follow up plan: Follow up call scheduled for 08/08/23    Encounter Outcome:  Pt. Visit Completed   [name] RN,BSN,CCM [facility] Care Coordination [phone] direct line

## 2023-08-03 DIAGNOSIS — N63 Unspecified lump in unspecified breast: Secondary | ICD-10-CM | POA: Insufficient documentation

## 2023-08-03 NOTE — Assessment & Plan Note (Signed)
Retropectoral implant lump in upper outer quadrant of left breast lower outer quadrant of right breast. Mammogram and ultrasound of the breast ordered.

## 2023-08-08 ENCOUNTER — Telehealth: Payer: Self-pay

## 2023-08-08 ENCOUNTER — Ambulatory Visit
Admission: RE | Admit: 2023-08-08 | Discharge: 2023-08-08 | Disposition: A | Discharge status: Home / Self Care | Payer: Medicare Other | Source: Ambulatory Visit | Attending: Nurse Practitioner | Admitting: Nurse Practitioner

## 2023-08-08 DIAGNOSIS — R896 Abnormal cytological findings in specimens from other organs, systems and tissues: Secondary | ICD-10-CM | POA: Diagnosis not present

## 2023-08-08 DIAGNOSIS — R928 Other abnormal and inconclusive findings on diagnostic imaging of breast: Secondary | ICD-10-CM | POA: Diagnosis not present

## 2023-08-08 DIAGNOSIS — N6002 Solitary cyst of left breast: Secondary | ICD-10-CM | POA: Diagnosis not present

## 2023-08-08 DIAGNOSIS — T8141XA Infection following a procedure, superficial incisional surgical site, initial encounter: Secondary | ICD-10-CM | POA: Diagnosis not present

## 2023-08-08 MED ORDER — LIDOCAINE 1 % OPTIME INJ - NO CHARGE
5.0000 mL | Freq: Once | INTRAMUSCULAR | Status: AC
  Administered 2023-08-08: 5 mL
  Filled 2023-08-08: qty 6

## 2023-08-08 NOTE — Patient Instructions (Signed)
Visit Information  Thank you for taking time to visit with me today. Please don't hesitate to contact me if I can be of assistance to you.   Following are the goals we discussed today:   Goals Addressed             This Visit's Progress    Obtaining assistance for resource needs and management of dementia       Interventions Today    Flowsheet Row Most Recent Value  Chronic Disease   Chronic disease during today's visit Other  [vascular dementia, status post left breast biopsy]  General Interventions   General Interventions Discussed/Reviewed General Interventions Reviewed, Doctor Visits  [evaluation of current treatment plan for dementia and patients adherence to plan as established by provider.  Assessed for ongoing/ increase in dementia like symptoms. Assessed pain level post left breast biopsy]  Doctor Visits Discussed/Reviewed Doctor Visits Reviewed  [confirmed spouse has date for patients neurology evaluation appointment.]  Education Interventions   Education Provided Provided Printed Education  [reviewed signs/ symptoms of infection at biopsy site. Education article mailed to patient / spouse on dementia]  Nutrition Interventions   Nutrition Discussed/Reviewed Nutrition Discussed  [Assessed patients nutritional habit/ appetite.]  Pharmacy Interventions   Pharmacy Dicussed/Reviewed Pharmacy Topics Reviewed  [medications reviewed. Discussed importance of being compliant with medicai]  Safety Interventions   Safety Discussed/Reviewed Safety Discussed  [assessed for falls. Discussed safety strategies for patients with dementia]              Our next appointment is by telephone on 09/01/23 at 2 pm  Please call the care guide team at [phone] if you need to cancel or reschedule your appointment.   If you are experiencing a Mental Health or Behavioral Health Crisis or need someone to talk to, please call the Suicide and Crisis Lifeline: 988 call 1-800-273-TALK (toll  free, 24 hour hotline)  The patient verbalized understanding of instructions, educational materials, and care plan provided today and agreed to receive a mailed copy of patient instructions, educational materials, and care plan.   [name] RN,BSN,CCM [facility] Care Coordination [phone] direct line

## 2023-08-08 NOTE — Patient Outreach (Signed)
Care Coordination   Initial Visit Note   08/08/2023 Name: [name] [name] MRN: [id number] DOB: [birth year]  [name] [name] is a 87 y.o. year old female who sees [name] [name], [name] Daring, MD for primary care. I spoke with  [name] [name] and spouse [name] [name] in person and primary care provider office.  What matters to the patients health and wellness today?  Spouse reports having left breast biopsy today.  Patient states she is doing well. Denies pain.  Spouse states his main concern for patient is her memory. He reports some days are better than others.  He denies patient having issues with wandering or leaving on stove/ water in the home.  He states they eat out at least 1 meal daily.  Spouse states patient may have forgetfulness regarding grandchildren's names and/ or other short term memory challenges.  He states when patient is up she is moving around and doing something all of the time.  He states she doesn't just sit down and relax. Spouse states patient is sleeping well.  Spouse states patient is scheduled to see the neurologist on 09/08/23.  Spouse states he is able to get patient back and forth to her doctor appointments.  He states patient is still able to care for her own personal hygiene and wash clothes.  Spouse states they have a son, [name] [name] that lives in the area and helps if they need it.  Spouse states he and patient still do their own grocery shopping and overall management of household chores.  Spouse states he doesn't feel patient needs any in home care/ community resources at this time.  He inquired of who to talk with if these services are needed in the future.     Goals Addressed             This Visit's Progress    Obtaining assistance for resource needs and management of dementia       Interventions Today    Flowsheet Row Most Recent Value  Chronic Disease   Chronic disease during today's visit Other  [vascular dementia, status post left breast biopsy]  General  Interventions   General Interventions Discussed/Reviewed General Interventions Reviewed, Doctor Visits  [evaluation of current treatment plan for dementia and patients adherence to plan as established by provider.  Assessed for ongoing/ increase in dementia like symptoms. Assessed pain level post left breast biopsy]  Doctor Visits Discussed/Reviewed Doctor Visits Reviewed  [confirmed spouse has date for patients neurology evaluation appointment.]  Education Interventions   Education Provided Provided Printed Education  [reviewed signs/ symptoms of infection at biopsy site. Education article mailed to patient / spouse on dementia]  Nutrition Interventions   Nutrition Discussed/Reviewed Nutrition Discussed  [Assessed patients nutritional habit/ appetite.]  Pharmacy Interventions   Pharmacy Dicussed/Reviewed Pharmacy Topics Reviewed  [medications reviewed. Discussed importance of being compliant with medicai]  Safety Interventions   Safety Discussed/Reviewed Safety Discussed  [assessed for falls. Discussed safety strategies for patients with dementia]              SDOH assessments and interventions completed:  Yes  SDOH Interventions Today    Flowsheet Row Most Recent Value  SDOH Interventions   Food Insecurity Interventions Intervention Not Indicated  Housing Interventions Intervention Not Indicated  Transportation Interventions Intervention Not Indicated        Care Coordination Interventions:  Yes, provided   Follow up plan: Follow up call scheduled for 09/01/23    Encounter Outcome:  Pt. Visit Completed  [name] Ina RN,BSN,CCM [facility] Care Coordination [phone] direct line

## 2023-08-11 ENCOUNTER — Other Ambulatory Visit: Payer: Self-pay | Admitting: Internal Medicine

## 2023-08-11 DIAGNOSIS — N63 Unspecified lump in unspecified breast: Secondary | ICD-10-CM

## 2023-08-12 ENCOUNTER — Telehealth: Payer: Self-pay | Admitting: Internal Medicine

## 2023-08-12 NOTE — Telephone Encounter (Signed)
Patient states she is returning our call.  I read [name] [name]'s message to patient.  Patient states she already had her MRI last week.

## 2023-08-12 NOTE — Addendum Note (Signed)
Addended by: Sherlene Shams on: 08/12/2023 03:00 PM   Modules accepted: Orders

## 2023-08-12 NOTE — Telephone Encounter (Signed)
Lft pt vm to call ofc to sch MRI. thanks 

## 2023-08-14 ENCOUNTER — Other Ambulatory Visit: Payer: Self-pay | Admitting: Internal Medicine

## 2023-08-14 DIAGNOSIS — N6320 Unspecified lump in the left breast, unspecified quadrant: Secondary | ICD-10-CM

## 2023-08-15 ENCOUNTER — Other Ambulatory Visit: Payer: Self-pay | Admitting: Internal Medicine

## 2023-08-15 DIAGNOSIS — N6320 Unspecified lump in the left breast, unspecified quadrant: Secondary | ICD-10-CM

## 2023-08-15 DIAGNOSIS — I4819 Other persistent atrial fibrillation: Secondary | ICD-10-CM

## 2023-08-30 ENCOUNTER — Telehealth: Payer: Self-pay | Admitting: Cardiovascular Disease

## 2023-08-30 NOTE — Telephone Encounter (Signed)
Pt spouse called in stating pt weight has went down since starting water pills. He states her normal weight is 160 and she is now down to 141. He denies any symptoms. Please advise what pt should do.

## 2023-08-30 NOTE — Telephone Encounter (Signed)
Called patient, advised that they are continuing with torsemide 20 mg daily- they have noticed that the weight has went down. Normally 160's and this morning was 141. Patient husband states she does not have any swelling anywhere, and questioned if we could cut back on the torsemide. Advised I would route to MD to advise further.   Patient husband verbalized understanding.

## 2023-08-31 MED ORDER — TORSEMIDE 10 MG PO TABS: 10.0000 mg | ORAL_TABLET | Freq: Every day | ORAL | 3 refills | Status: DC

## 2023-08-31 NOTE — Telephone Encounter (Signed)
Patient's husband has been made aware. He would like to know if she should stay on the potassium

## 2023-08-31 NOTE — Telephone Encounter (Signed)
Decrease torsemide to 10 mg daily. ?

## 2023-09-01 MED ORDER — POTASSIUM CHLORIDE CRYS ER 10 MEQ PO TBCR: 10.0000 meq | EXTENDED_RELEASE_TABLET | Freq: Every day | ORAL | 3 refills | Status: DC

## 2023-09-01 NOTE — Telephone Encounter (Signed)
Decrease potassium chloride to 10 mEq once daily.

## 2023-09-01 NOTE — Telephone Encounter (Signed)
The patient's husband has been made aware and verbalized his understanding.

## 2023-09-08 DIAGNOSIS — E871 Hypo-osmolality and hyponatremia: Secondary | ICD-10-CM | POA: Diagnosis not present

## 2023-09-08 DIAGNOSIS — E538 Deficiency of other specified B group vitamins: Secondary | ICD-10-CM | POA: Diagnosis not present

## 2023-09-08 DIAGNOSIS — E119 Type 2 diabetes mellitus without complications: Secondary | ICD-10-CM | POA: Diagnosis not present

## 2023-09-09 ENCOUNTER — Ambulatory Visit: Payer: Medicare Other | Admitting: Podiatry

## 2023-09-15 ENCOUNTER — Other Ambulatory Visit: Payer: Self-pay | Admitting: Internal Medicine

## 2023-09-20 DIAGNOSIS — E785 Hyperlipidemia, unspecified: Secondary | ICD-10-CM | POA: Diagnosis not present

## 2023-09-20 DIAGNOSIS — E876 Hypokalemia: Secondary | ICD-10-CM | POA: Diagnosis not present

## 2023-09-20 DIAGNOSIS — I10 Essential (primary) hypertension: Secondary | ICD-10-CM | POA: Diagnosis not present

## 2023-09-20 DIAGNOSIS — E1129 Type 2 diabetes mellitus with other diabetic kidney complication: Secondary | ICD-10-CM | POA: Diagnosis not present

## 2023-09-20 DIAGNOSIS — E871 Hypo-osmolality and hyponatremia: Secondary | ICD-10-CM | POA: Diagnosis not present

## 2023-09-22 ENCOUNTER — Encounter: Payer: Self-pay | Admitting: Medical

## 2023-09-22 ENCOUNTER — Ambulatory Visit: Payer: Medicare Other | Attending: Cardiovascular Disease | Admitting: Medical

## 2023-09-22 VITALS — BP 126/70 | HR 60 | Ht 64.0 in | Wt 150.2 lb

## 2023-09-22 DIAGNOSIS — Z79899 Other long term (current) drug therapy: Secondary | ICD-10-CM | POA: Diagnosis not present

## 2023-09-22 DIAGNOSIS — E871 Hypo-osmolality and hyponatremia: Secondary | ICD-10-CM | POA: Diagnosis not present

## 2023-09-22 DIAGNOSIS — I495 Sick sinus syndrome: Secondary | ICD-10-CM

## 2023-09-22 DIAGNOSIS — I4821 Permanent atrial fibrillation: Secondary | ICD-10-CM

## 2023-09-22 DIAGNOSIS — I5032 Chronic diastolic (congestive) heart failure: Secondary | ICD-10-CM

## 2023-09-22 DIAGNOSIS — F0390 Unspecified dementia without behavioral disturbance: Secondary | ICD-10-CM | POA: Diagnosis not present

## 2023-09-22 NOTE — Patient Instructions (Signed)
Medication Instructions:  Your physician recommends that you continue on your current medications as directed. Please refer to the Current Medication list given to you today.   *If you need a refill on your cardiac medications before your next appointment, please call your pharmacy*   Lab Work: Your provider would like for you to have following labs drawn today (BMP).     Testing/Procedures: No test ordered today    Follow-Up: At [facility], you and your health needs are our priority.  As part of our continuing mission to provide you with exceptional heart care, we have created designated Provider Care Teams.  These Care Teams include your primary Cardiologist (physician) and Advanced Practice Providers (APPs -  Physician Assistants and Nurse Practitioners) who all work together to provide you with the care you need, when you need it.  We recommend signing up for the patient portal called "MyChart".  Sign up information is provided on this After Visit Summary.  MyChart is used to connect with patients for Virtual Visits (Telemedicine).  Patients are able to view lab/test results, encounter notes, upcoming appointments, etc.  Non-urgent messages can be sent to your provider as well.   To learn more about what you can do with MyChart, go to [email or web address].    Your next appointment:   4 month(s)  Provider:   You may see Lorine Bears, MD or one of the following Advanced Practice Providers on your designated Care Team:   Nicolasa Ducking, NP Eula Listen, PA-C Cadence Fransico Michael, PA-C Charlsie Quest, NP

## 2023-09-22 NOTE — Progress Notes (Signed)
Cardiology Office Note:    Date:  09/22/2023   ID:  [name], DOB 09/05/1936, MRN [id number]  PCP:  Sherlene Shams, MD  [facility] Cardiologist:  Lorine Bears, MD  [facility] Electrophysiologist:  Sherryl Manges, MD   Referring MD: Sherlene Shams, MD   Chief Complaint: 4 month follow-up  History of Present Illness:    [name] [name] is a 87 y.o. female with a hx of  permanent Afib/flutter, HTN, tachy-brady s/p PPM, HFimpEF, dementia who presents for 1 week follow-up.    She has a history of afib with multiple cardioversions. Now, she is rate controlled. She has a history of tachy-induced CM, but this has resolved. Cath in 2020 showed minimal irregularities with no evidence of obstructive CAD, normal LVEF and mildly elevated LVEDP.   She was hospitalized in April 2024 with syncope with prolonged pauses. She underwent single-lead pacemaker placement. Echo during admission showed normal LVSF with mid to moderate MR. She saw Dr. Kirke Corin 05/05/23 and felt very sluggish. HR was around 50bpm. Diltiazem was discontinued and amlodipine was added. She saw EP 05/24/23 and BP was elevated. She was started on lasix for lower leg edema. Repeat limited echo showed LVEF 55-60%, severely dilated atrium, mild MR. In follow-up the patient was still volume up and she was switched to Torsemide.   The patient was last seen 06/21/23 and was stable from a cardiac perspective. The main concern was memory issues.   Today,  the patient reports she is OK. Husband says patient is having memory issues, he thinks it's from a medication. She is taking diuretic every day. She has rare chest discomfort. It may be from acid reflux. She saw nephrology recently. Labs show hyponatremia. She denies shortness of breath or significant lower leg edema.   Past Medical History:  Diagnosis Date   Actinic keratosis    Arthritis    Atrial flutter (HCC) 02/2011   s/p cardioversion    Chest pain    a. H/o cardiac cath x  2-> 2012 -->nl cors;  b. 12/2016 MV: EF 61%, small region of mild perfusion defect in the apical anteroseptal region c/w breast attenuation, no ischemia-->Low risk; c. 06/2019 MV: Mod size, mild inflat ischemia, EF 59%. Cor and Ao Ca2+. Inflat defect more pronounced on this study compared to last; c. 07/2019 Cath: LM nl, LAD min irregs, LCX nl, OM1/2/3 nl, RCA min irregs.   CKD (chronic kidney disease), stage III (HCC)    Concussion with no loss of consciousness 09/01/2016   Cystocele    Degenerative disorder of bone    GERD (gastroesophageal reflux disease)    Headache(784.0)    chronic   Hiatal hernia    Hyperlipidemia    Hypertension    Influenza 01/10/2017   Knee fracture    Migraines    Mobitz type 2 second degree atrioventricular block    a. felt to be 2/2 amiodarone, resolved with decreased amiodarone dose.  Amio since d/c'd.   Permanent atrial fibrillation (HCC)    a. status post multiple DCCVs; b. 2018 - eval for PVI but opted for rate control.   PONV (postoperative nausea and vomiting)    oxycodone and codiene cause N/V    Pre-syncope    a. In setting of dehydration and AKI in the past.   Sleep apnea    Squamous cell carcinoma of skin 11/10/2020   left distal posterior deltoid (EDC 01/15/2021)   Tachycardia induced cardiomyopathy (HCC)    a.  Resolved;  b. 08/2017 Echo: EF 50-55%, no rwma, mild MR, mildly to mod dil LA/RA; c. 02/2018 Echo: EF 55-60%, mild MR. Mildly dil LA. Nl RVSP. PASP .   Venous insufficiency    Vertigo     Past Surgical History:  Procedure Laterality Date   ABDOMINAL HYSTERECTOMY  1990   APPENDECTOMY     AUGMENTATION MAMMAPLASTY Bilateral 1986   implants   AUGMENTATION MAMMAPLASTY  1990   AUGMENTATION MAMMAPLASTY  2011   CARDIAC CATHETERIZATION     CARDIOVERSION     x 3   CARDIOVERSION     CARDIOVERSION N/A 02/07/2017   Procedure: CARDIOVERSION;  Surgeon: Iran Ouch, MD;  Location: [facility] ORS;  Service: Cardiovascular;  Laterality: N/A;    CARDIOVERSION N/A 07/22/2017   Procedure: Cardioversion;  Surgeon: Antonieta Iba, MD;  Location: [facility] ORS;  Service: Cardiovascular;  Laterality: N/A;   CATARACT EXTRACTION W/PHACO Right 09/21/2016   Procedure: CATARACT EXTRACTION PHACO AND INTRAOCULAR LENS PLACEMENT (IOC);  Surgeon: Galen Manila, MD;  Location: [facility] ORS;  Service: Ophthalmology;  Laterality: Right;  [place] 44.1 AP% 16.5 CDE 7.30 Fluid Pack Lot #[id number]   CATARACT EXTRACTION W/PHACO Left 10/19/2016   Procedure: CATARACT EXTRACTION PHACO AND INTRAOCULAR LENS PLACEMENT (IOC);  Surgeon: Galen Manila, MD;  Location: [facility] ORS;  Service: Ophthalmology;  Laterality: Left;  [place] 53.7 AP% 19.5 CDE 10.45 Fluid pack lot # [id number]   CHOLECYSTECTOMY     COMBINED AUGMENTATION MAMMAPLASTY AND ABDOMINOPLASTY     JOINT REPLACEMENT Left 06/04/2013   left knee   KNEE ARTHROSCOPY Right 08/16/2016   Procedure: ARTHROSCOPY KNEE, tear posterior horn medial meniscus, tear anterior and posterior horns of lateral meniscus, chondromalacia of lateral compartment grade 3 patella and grade 4 medial;  Surgeon: Donato Heinz, MD;  Location: [facility];  Service: Orthopedics;  Laterality: Right;   LEFT HEART CATH AND CORONARY ANGIOGRAPHY Left 07/27/2019   Procedure: LEFT HEART CATH AND CORONARY ANGIOGRAPHY;  Surgeon: Iran Ouch, MD;  Location: [facility] INVASIVE CV LAB;  Service: Cardiovascular;  Laterality: Left;   MASTECTOMY  1986   nipple sparing mastectomy/Bilateral with silicone  breast implants, s/p saline replacements   Multiple orthopedic procedures     NOSE SURGERY     PACEMAKER IMPLANT N/A 04/13/2023   Procedure: PACEMAKER IMPLANT;  Surgeon: Duke Salvia, MD;  Location: [facility] INVASIVE CV LAB;  Service: Cardiovascular;  Laterality: N/A;   TEE WITHOUT CARDIOVERSION N/A 09/26/2017   Procedure: TRANSESOPHAGEAL ECHOCARDIOGRAM (TEE);  Surgeon: Pricilla Riffle, MD;  Location: [facility] ENDOSCOPY;  Service: Cardiovascular;  Laterality: N/A;   TOTAL KNEE  ARTHROPLASTY Left     Current Medications: Current Meds  Medication Sig   acetaminophen (TYLENOL) 500 MG tablet Take 1,000 mg by mouth every 6 (six) hours as needed (pain).    ALPRAZolam (XANAX) 0.25 MG tablet Take 1 tablet (0.25 mg total) by mouth 2 (two) times daily as needed for anxiety.   aluminum-magnesium hydroxide 200-200 MG/5ML suspension Take 20 mLs by mouth every 6 (six) hours as needed for indigestion.    apixaban (ELIQUIS) 5 MG TABS tablet TAKE ONE TABLET TWICE DAILY   busPIRone (BUSPAR) 10 MG tablet TAKE ONE TABLET BY MOUTH TWICE DAILY   Carboxymethylcellul-Glycerin (LUBRICATING EYE DROPS OP) Place 1 drop into both eyes daily as needed (dry eyes).   cholecalciferol (VITAMIN D) 1000 units tablet Take 1,000 Units by mouth daily.   Coenzyme Q10 (COQ10) 200 MG CAPS Take 200 mg by mouth daily.   donepezil (  ARICEPT ODT) 5 MG disintegrating tablet Take 1 tablet (5 mg total) by mouth at bedtime.   metoprolol tartrate (LOPRESSOR) 25 MG tablet Take 1 tablet (25 mg total) by mouth 2 (two) times daily.   pantoprazole (PROTONIX) 40 MG tablet TAKE 1 TABLET BY MOUTH DAILY   potassium chloride SA (KLOR-CON M) 10 MEQ tablet Take 1 tablet (10 mEq total) by mouth daily.   pravastatin (PRAVACHOL) 20 MG tablet TAKE 1 TABLET BY MOUTH DAILY   Probiotic Product (PROBIOTIC ADVANCED PO) Take 1 tablet by mouth daily.   sodium chloride (OCEAN) 0.65 % SOLN nasal spray Place 1 spray into both nostrils daily as needed for congestion.   torsemide (DEMADEX) 10 MG tablet Take 1 tablet (10 mg total) by mouth daily.     Allergies:   Cyclobenzaprine, Iodine, Amiodarone, Biaxin [clarithromycin], Codeine, Digoxin and related, Enalapril, Famotidine, Fluocinonide, Iodinated contrast media, Iodine-131, Losartan potassium, Omnicef [cefdinir], Oxycodone, Promethazine, Sertraline, Tizanidine hcl, Warfarin and related, and Xarelto [rivaroxaban]   Social History   Socioeconomic History   Marital status: Married     Spouse name: Not on file   Number of children: 1   Years of education: Not on file   Highest education level: Not on file  Occupational History    Employer: RETIRED  Tobacco Use   Smoking status: Never   Smokeless tobacco: Never  Vaping Use   Vaping status: Never Used  Substance and Sexual Activity   Alcohol use: No   Drug use: No   Sexual activity: Never    Birth control/protection: Surgical  Other Topics Concern   Not on file  Social History Narrative   Married    Social Determinants of Health   Financial Resource Strain: Low Risk  (05/06/2022)   Overall Financial Resource Strain (CARDIA)    Difficulty of Paying Living Expenses: Not hard at all  Food Insecurity: No Food Insecurity (08/08/2023)   Hunger Vital Sign    Worried About Running Out of Food in the Last Year: Never true    Ran Out of Food in the Last Year: Never true  Transportation Needs: No Transportation Needs (08/08/2023)   PRAPARE - Administrator, Civil Service (Medical): No    Lack of Transportation (Non-Medical): No  Physical Activity: Insufficiently Active (05/06/2022)   Exercise Vital Sign    Days of Exercise per Week: 3 days    Minutes of Exercise per Session: 20 min  Stress: No Stress Concern Present (05/06/2022)   [employer] of Occupational Health - Occupational Stress Questionnaire    Feeling of Stress : Not at all  Social Connections: Unknown (05/06/2022)   Social Connection and Isolation Panel [NHANES]    Frequency of Communication with Friends and Family: Not on file    Frequency of Social Gatherings with Friends and Family: Not on file    Attends Religious Services: Not on file    Active Member of Clubs or Organizations: Not on file    Attends Banker Meetings: Not on file    Marital Status: Married     Family History: The patient's family history includes Breast cancer in her maternal grandmother and sister; Breast cancer (age of onset: 62) in her sister; Breast  cancer (age of onset: 42) in her sister; Breast cancer (age of onset: 70) in her sister; Diabetes in her brother; Esophageal cancer in her brother; Heart disease in her mother and son; Kidney failure in her brother; Stomach cancer in her father. There is  no history of Ovarian cancer.  ROS:   Please see the history of present illness.     All other systems reviewed and are negative.  EKGs/Labs/Other Studies Reviewed:    The following studies were reviewed today:  Echo limited 06/06/23 1. Left ventricular ejection fraction, by estimation, is 55 to 60%. The  left ventricle has normal function.   2. Right ventricular systolic function is normal. The right ventricular  size is moderately enlarged. There is mildly elevated pulmonary artery  systolic pressure.   3. Left atrial size was severely dilated.   4. Right atrial size was severely dilated.   5. The mitral valve is normal in structure. Mild mitral valve  regurgitation.   6. Tricuspid valve regurgitation is moderate to severe.   7. The inferior vena cava is dilated in size with <50% respiratory  variability, suggesting right atrial pressure of 15 mmHg.    Echo 03/2023 1. Left ventricular ejection fraction, by estimation, is 55 to 60%. The  left ventricle has normal function. The left ventricle has no regional  wall motion abnormalities. Left ventricular diastolic parameters are  indeterminate.   2. Right ventricular systolic function is normal. The right ventricular  size is mildly enlarged. There is moderately elevated pulmonary artery  systolic pressure. The estimated right ventricular systolic pressure is  51.8 mmHg.   3. Left atrial size was severely dilated.   4. Right atrial size was severely dilated.   5. The mitral valve is normal in structure. Mild to moderate mitral valve  regurgitation. No evidence of mitral stenosis.   6. Tricuspid valve regurgitation is mild to moderate.   7. The aortic valve is normal in structure.  Aortic valve regurgitation is  not visualized. No aortic stenosis is present.   8. The inferior vena cava is normal in size with greater than 50%  respiratory variability, suggesting right atrial pressure of 3 mmHg.    Myoview lexiscan 08/2022 Narrative & Impression  Pharmacological myocardial perfusion imaging study with no significant  ischemia Normal wall motion, EF estimated at 57% No EKG changes concerning for ischemia at peak stress or in recovery. CT attenuation correction images with moderate aortic atherosclerosis, mild coronary calcification Low risk scan     Signed, Dossie Arbour, MD, Ph.D [facility]    LHC 2020  The left ventricular systolic function is normal. LV end diastolic pressure is mildly elevated. The left ventricular ejection fraction is 55-65% by visual estimate.   1.  Minimal irregularities with no evidence of obstructive coronary artery disease. 2.  Normal LV systolic function and mildly elevated left ventricular end-diastolic pressure.   Recommendations: The chest pain is likely noncardiac.  Continue rate control and anticoagulation for atrial fibrillation.  Can resume Eliquis tomorrow morning.    EKG:  EKG is  ordered today.  The ekg ordered today demonstrates Vpaced rhythm 60bpm  Recent Labs: 04/08/2023: TSH 0.971 04/13/2023: Magnesium 2.0 05/30/2023: ALT 25; Hemoglobin 11.7; Platelets 243 06/14/2023: BUN 13; Creatinine, Ser 0.83; Potassium 3.6; Sodium 127  Recent Lipid Panel    Component Value Date/Time   CHOL 149 12/29/2022 1515   TRIG 65.0 12/29/2022 1515   HDL 66.10 12/29/2022 1515   CHOLHDL 2 12/29/2022 1515   VLDL 13.0 12/29/2022 1515   LDLCALC 70 12/29/2022 1515   LDLDIRECT 72.0 12/29/2022 1515     Physical Exam:    VS:  BP 126/70 (BP Location: Left Arm, Patient Position: Sitting, Cuff Size: Normal)   Pulse 60   Ht  5\' 4"  (1.626 m)   Wt 150 lb 3.2 oz (68.1 kg)   SpO2 98%   BMI 25.78 kg/m     Wt Readings from Last 3 Encounters:   09/22/23 150 lb 3.2 oz (68.1 kg)  07/21/23 155 lb 3.2 oz (70.4 kg)  07/19/23 157 lb 12.8 oz (71.6 kg)     GEN:  Well nourished, well developed in no acute distress HEENT: Normal NECK: No JVD; No carotid bruits LYMPHATICS: No lymphadenopathy CARDIAC: RRR, no murmurs, rubs, gallops RESPIRATORY:  Clear to auscultation without rales, wheezing or rhonchi  ABDOMEN: Soft, non-tender, non-distended MUSCULOSKELETAL:  No edema; No deformity  SKIN: Warm and dry NEUROLOGIC:  Alert and oriented x 3 PSYCHIATRIC:  Normal affect   ASSESSMENT:    1. Permanent atrial fibrillation (HCC)   2. Tachycardia-bradycardia (HCC)   3. Heart failure with improved ejection fraction (HFimpEF) (HCC)   4. Progressive dementia with uncertain etiology (HCC)   5. Hyponatremia   6. Medication management    PLAN:    In order of problems listed above:  Permanent Afib Ventricular rates are well controlled on metoprolol. Continue Eliquis 5mg  BID  H/o tachy-induced CM HFimpEF Most recent limited echo 05/2023 showed LVEF 55-60%, normal RVSF, mildly elevated pulmonary HTN, severely dilated biatrium, moderate to severe TR. Patient has minimal lower leg edema on the left side. She takes Torsemide 10mg  daily and potassium. Patient's husband is concerned she is taking too much Torsemide, so I will check a BMET.   Progressive Dementia Seems to be the main concern of the husband. She is followed by neurology.   Tachybrady syndrome s/p PPM Patient is followed by EP. Continue BB therapy.  Hyponatremia Labs 3 months ago showed Na 127. Re-check BMET as above.   Disposition: Follow up in 4 month(s) with MD/APP    Signed, Tiffane Sheldon David Stall, PA-C  09/22/2023 9:28 AM    [facility]

## 2023-09-23 LAB — BASIC METABOLIC PANEL
BUN/Creatinine Ratio: 17 (ref 12–28)
BUN: 17 mg/dL (ref 8–27)
CO2: 23 mmol/L (ref 20–29)
Calcium: 9.7 mg/dL (ref 8.7–10.3)
Chloride: 97 mmol/L (ref 96–106)
Creatinine, Ser: 1.03 mg/dL — ABNORMAL HIGH (ref 0.57–1.00)
Glucose: 83 mg/dL (ref 70–99)
Potassium: 4.8 mmol/L (ref 3.5–5.2)
Sodium: 136 mmol/L (ref 134–144)
eGFR: 53 mL/min/{1.73_m2} — ABNORMAL LOW (ref >59)

## 2023-09-30 DIAGNOSIS — Z7901 Long term (current) use of anticoagulants: Secondary | ICD-10-CM | POA: Diagnosis not present

## 2023-09-30 DIAGNOSIS — H61892 Other specified disorders of left external ear: Secondary | ICD-10-CM | POA: Diagnosis not present

## 2023-10-11 DIAGNOSIS — J300 Vasomotor rhinitis: Secondary | ICD-10-CM | POA: Diagnosis not present

## 2023-10-11 DIAGNOSIS — H60332 Swimmer's ear, left ear: Secondary | ICD-10-CM | POA: Diagnosis not present

## 2023-10-12 LAB — CUP PACEART REMOTE DEVICE CHECK
Battery Remaining Longevity: 138 mo
Battery Remaining Percentage: 95.5 %
Battery Voltage: 3.02 V
Brady Statistic RV Percent Paced: 97 %
Date Time Interrogation Session: 20241023020014
Implantable Lead Connection Status: 753985
Implantable Lead Implant Date: 20240424
Implantable Lead Location: 753860
Implantable Lead Model: 1948
Implantable Pulse Generator Implant Date: 20240424
Lead Channel Impedance Value: 710 Ohm
Lead Channel Pacing Threshold Amplitude: 0.5 V
Lead Channel Pacing Threshold Pulse Width: 0.5 ms
Lead Channel Sensing Intrinsic Amplitude: 9.3 mV
Lead Channel Setting Pacing Amplitude: 0.75 V
Lead Channel Setting Pacing Pulse Width: 0.5 ms
Lead Channel Setting Sensing Sensitivity: 2 mV
Pulse Gen Model: 1272
Pulse Gen Serial Number: 8124077

## 2023-10-13 ENCOUNTER — Ambulatory Visit (INDEPENDENT_AMBULATORY_CARE_PROVIDER_SITE_OTHER): Payer: Medicare Other

## 2023-10-13 DIAGNOSIS — I495 Sick sinus syndrome: Secondary | ICD-10-CM

## 2023-10-14 ENCOUNTER — Other Ambulatory Visit: Payer: Self-pay | Admitting: Cardiovascular Disease

## 2023-10-14 ENCOUNTER — Encounter: Payer: Self-pay | Admitting: Podiatry

## 2023-10-14 ENCOUNTER — Ambulatory Visit: Payer: Medicare Other | Admitting: Podiatry

## 2023-10-14 VITALS — Ht 64.0 in | Wt 150.2 lb

## 2023-10-14 DIAGNOSIS — D689 Coagulation defect, unspecified: Secondary | ICD-10-CM

## 2023-10-14 DIAGNOSIS — M79674 Pain in right toe(s): Secondary | ICD-10-CM | POA: Diagnosis not present

## 2023-10-14 DIAGNOSIS — B351 Tinea unguium: Secondary | ICD-10-CM

## 2023-10-14 DIAGNOSIS — Q828 Other specified congenital malformations of skin: Secondary | ICD-10-CM | POA: Diagnosis not present

## 2023-10-14 DIAGNOSIS — M79675 Pain in left toe(s): Secondary | ICD-10-CM

## 2023-10-14 NOTE — Telephone Encounter (Signed)
Prescription refill request for Eliquis received. Indication:afib Last office visit:10/24 Scr:1.03  10/24 Age: 87 Weight:68.1  kg  Prescription refilled

## 2023-10-14 NOTE — Progress Notes (Unsigned)
Subjective:  Patient ID: [name] [name], female    DOB: [birth year],  MRN: [id number]  87 y.o. female presents to clinic with  {jgcomplaint:23593}  Chief Complaint  Patient presents with   Nail Problem    RFC, Pt is not a diabetic, PCP name is Dr Darrick Huntsman and Pt is unsure of her last office visit.     New problem(s): None {jgcomplaint:23593}  PCP is Sherlene Shams, MD.  Allergies  Allergen Reactions   Cyclobenzaprine Other (See Comments)    Dropped blood pressure and heart rate, "talking out of my mind"   Iodine Anaphylaxis    NO PROBLEMS WITH BETADINE   Amiodarone     Cannot remember    Biaxin [Clarithromycin] Nausea And Vomiting   Codeine Nausea And Vomiting   Digoxin And Related Other (See Comments)    Fatigue, eye puffiness, hoarsness   Diltiazem Other (See Comments)   Enalapril Other (See Comments)    unknown   Famotidine     Caused dizziness   Fluocinonide Other (See Comments)    Tingling sensation in head and redness to scalp.   Iodinated Contrast Media Other (See Comments)    Tachycardia   Iodine-131    Losartan Potassium Other (See Comments)    Dizzy and headache   Omnicef [Cefdinir]    Oxycodone Nausea And Vomiting   Promethazine Other (See Comments)    Unknown    Sertraline     Dizziness and weakness   Tizanidine Hcl Other (See Comments)    hypotension    Warfarin And Related     Bleeding    Xarelto [Rivaroxaban] Other (See Comments)    bleeding    Review of Systems: Negative except as noted in the HPI.   Objective:  [name] [name] is a pleasant 87 y.o. female {jgbodyhabitus:24098}.  Vascular Examination: Vascular status intact b/l with palpable pedal pulses. CFT immediate b/l. No edema. No pain with calf compression b/l. Skin temperature gradient WNL b/l. {jgvascular:23595}  Neurological Examination: Sensation grossly intact b/l with 10 gram monofilament. Vibratory sensation intact b/l. {jgneuro:23601::"Protective sensation intact 5/5  intact bilaterally with 10g monofilament b/l.","Vibratory sensation intact b/l.","Proprioception intact bilaterally."}  Dermatological Examination: Pedal skin with normal turgor, texture and tone b/l. Toenails 1-5 b/l thick, discolored, elongated with subungual debris and pain on dorsal palpation. No hyperkeratotic lesions noted b/l. {jgderm:23598}  Musculoskeletal Examination: Muscle strength 5/5 to b/l LE. {jgmsk:23600}  Radiographs: None  Last A1c:      Latest Ref Rng & Units 12/29/2022    3:15 PM  Hemoglobin A1C  Hemoglobin-A1c 4.6 - 6.5 % 6.5      Assessment:  No diagnosis found.  Plan:  {jgplan:23602::"-Patient/POA to call should there be question/concern in the interim."}  Return in about 3 months (around 01/14/2024).  Freddie Breech, DPM

## 2023-10-14 NOTE — Telephone Encounter (Signed)
Please review

## 2023-10-18 ENCOUNTER — Ambulatory Visit: Payer: Medicare Other | Admitting: Dermatology

## 2023-10-18 DIAGNOSIS — D492 Neoplasm of unspecified behavior of bone, soft tissue, and skin: Secondary | ICD-10-CM | POA: Diagnosis not present

## 2023-10-18 DIAGNOSIS — L578 Other skin changes due to chronic exposure to nonionizing radiation: Secondary | ICD-10-CM | POA: Diagnosis not present

## 2023-10-18 DIAGNOSIS — L814 Other melanin hyperpigmentation: Secondary | ICD-10-CM | POA: Diagnosis not present

## 2023-10-18 DIAGNOSIS — W908XXA Exposure to other nonionizing radiation, initial encounter: Secondary | ICD-10-CM | POA: Diagnosis not present

## 2023-10-18 DIAGNOSIS — L570 Actinic keratosis: Secondary | ICD-10-CM | POA: Diagnosis not present

## 2023-10-18 DIAGNOSIS — L821 Other seborrheic keratosis: Secondary | ICD-10-CM

## 2023-10-18 DIAGNOSIS — L820 Inflamed seborrheic keratosis: Secondary | ICD-10-CM | POA: Diagnosis not present

## 2023-10-18 DIAGNOSIS — D485 Neoplasm of uncertain behavior of skin: Secondary | ICD-10-CM

## 2023-10-18 NOTE — Patient Instructions (Signed)

## 2023-10-18 NOTE — Progress Notes (Signed)
Follow-Up Visit   Subjective  [name] [name] is a 87 y.o. female who presents for the following: Irregular skin lesions on the scalp and neck. Pt concerned and would like checked today.   The patient has spots, moles and lesions to be evaluated, some may be new or changing and the patient may have concern these could be cancer.   The following portions of the chart were reviewed this encounter and updated as appropriate: medications, allergies, medical history  Review of Systems:  No other skin or systemic complaints except as noted in HPI or Assessment and Plan.  Objective  Well appearing patient in no apparent distress; mood and affect are within normal limits.   A focused examination was performed of the following areas:   Relevant exam findings are noted in the Assessment and Plan.  L sup forehead near hairline 1.2 cm crusted papule.  L temple x 1, L forehead x 1 (2) Erythematous thin papules/macules with gritty scale.   R neck x 8 (8) Stuck-on, waxy, tan-brown papules -- Discussed benign etiology and prognosis.     Assessment & Plan   SEBORRHEIC KERATOSIS - Stuck-on, waxy, tan-brown papules and/or plaques  - Benign-appearing - Discussed benign etiology and prognosis. - Observe - Call for any changes  LENTIGINES Exam: scattered tan macules Due to sun exposure Treatment Plan: Benign-appearing, observe. Recommend daily broad spectrum sunscreen SPF 30+ to sun-exposed areas, reapply every 2 hours as needed.  Call for any changes  Neoplasm of uncertain behavior of skin L sup forehead near hairline  Epidermal / dermal shaving  Lesion diameter (cm):  1.2 Informed consent: discussed and consent obtained   Timeout: patient name, date of birth, surgical site, and procedure verified   Procedure prep:  Patient was prepped and draped in usual sterile fashion Prep type:  Isopropyl alcohol Anesthesia: the lesion was anesthetized in a standard fashion   Anesthetic:   1% lidocaine w/ epinephrine 1-100,000 buffered w/ 8.4% NaHCO3 Instrument used: flexible razor blade   Hemostasis achieved with: pressure, aluminum chloride and electrodesiccation   Outcome: patient tolerated procedure well   Post-procedure details: sterile dressing applied and wound care instructions given   Dressing type: bandage and petrolatum    Destruction of lesion Complexity: extensive   Destruction method: electrodesiccation and curettage   Informed consent: discussed and consent obtained   Timeout:  patient name, date of birth, surgical site, and procedure verified Procedure prep:  Patient was prepped and draped in usual sterile fashion Prep type:  Isopropyl alcohol Anesthesia: the lesion was anesthetized in a standard fashion   Anesthetic:  1% lidocaine w/ epinephrine 1-100,000 buffered w/ 8.4% NaHCO3 Curettage performed in three different directions: Yes   Electrodesiccation performed over the curetted area: Yes   Lesion length (cm):  1.2 Lesion width (cm):  1.2 Margin per side (cm):  0.2 Final wound size (cm):  1.6 Hemostasis achieved with:  pressure, aluminum chloride and electrodesiccation Outcome: patient tolerated procedure well with no complications   Post-procedure details: sterile dressing applied and wound care instructions given   Dressing type: bandage and petrolatum    Specimen 1 - Surgical pathology Differential Diagnosis: D48.5 r/o BCC ED&C today Check Margins: No  AK (actinic keratosis) (2) L temple x 1, L forehead x 1  Actinic keratoses are precancerous spots that appear secondary to cumulative UV radiation exposure/sun exposure over time. They are chronic with expected duration over 1 year. A portion of actinic keratoses will progress to squamous cell carcinoma  of the skin. It is not possible to reliably predict which spots will progress to skin cancer and so treatment is recommended to prevent development of skin cancer.  Recommend daily broad spectrum  sunscreen SPF 30+ to sun-exposed areas, reapply every 2 hours as needed.  Recommend staying in the shade or wearing long sleeves, sun glasses (UVA+UVB protection) and wide brim hats (4-inch brim around the entire circumference of the hat). Call for new or changing lesions.   Destruction of lesion - L temple x 1, L forehead x 1 (2) Complexity: simple   Destruction method: cryotherapy   Informed consent: discussed and consent obtained   Timeout:  patient name, date of birth, surgical site, and procedure verified Lesion destroyed using liquid nitrogen: Yes   Region frozen until ice ball extended beyond lesion: Yes   Outcome: patient tolerated procedure well with no complications   Post-procedure details: wound care instructions given    Inflamed seborrheic keratosis (8) R neck x 8  Symptomatic, irritating, patient would like treated.   Destruction of lesion - R neck x 8 (8) Complexity: simple   Destruction method: cryotherapy   Informed consent: discussed and consent obtained   Timeout:  patient name, date of birth, surgical site, and procedure verified Lesion destroyed using liquid nitrogen: Yes   Region frozen until ice ball extended beyond lesion: Yes   Outcome: patient tolerated procedure well with no complications   Post-procedure details: wound care instructions given    Seborrheic keratosis  Actinic skin damage  Lentigo  ACTINIC DAMAGE - chronic, secondary to cumulative UV radiation exposure/sun exposure over time - diffuse scaly erythematous macules with underlying dyspigmentation - Recommend daily broad spectrum sunscreen SPF 30+ to sun-exposed areas, reapply every 2 hours as needed.  - Recommend staying in the shade or wearing long sleeves, sun glasses (UVA+UVB protection) and wide brim hats (4-inch brim around the entire circumference of the hat). - Call for new or changing lesions.   Return for appointment as scheduled.  Maylene Roes, CMA, am acting as scribe  for Armida Sans, MD .   Documentation: I have reviewed the above documentation for accuracy and completeness, and I agree with the above.  Armida Sans, MD

## 2023-10-21 LAB — SURGICAL PATHOLOGY

## 2023-10-24 ENCOUNTER — Telehealth: Payer: Self-pay

## 2023-10-24 NOTE — Telephone Encounter (Signed)
-----   Message from Armida Sans sent at 10/21/2023  6:10 PM EDT ----- FINAL DIAGNOSIS        1. Skin, left sup forehead near hairline :       PIGMENTED SEBORRHEIC KERATOSIS   Benign keratosis No further treatment needed

## 2023-10-24 NOTE — Telephone Encounter (Signed)
Patient informed of pathology results 

## 2023-10-27 ENCOUNTER — Ambulatory Visit: Payer: Medicare Other | Admitting: Cardiovascular Disease

## 2023-10-29 ENCOUNTER — Encounter: Payer: Self-pay | Admitting: Dermatology

## 2023-10-31 ENCOUNTER — Other Ambulatory Visit: Payer: Self-pay | Admitting: Cardiovascular Disease

## 2023-10-31 DIAGNOSIS — I10 Essential (primary) hypertension: Secondary | ICD-10-CM

## 2023-10-31 DIAGNOSIS — I4821 Permanent atrial fibrillation: Secondary | ICD-10-CM

## 2023-11-01 NOTE — Progress Notes (Signed)
Remote pacemaker transmission.   

## 2023-11-29 ENCOUNTER — Other Ambulatory Visit: Payer: Self-pay | Admitting: Cardiovascular Disease

## 2023-11-29 ENCOUNTER — Telehealth: Payer: Self-pay | Admitting: *Deleted

## 2023-11-29 DIAGNOSIS — J3489 Other specified disorders of nose and nasal sinuses: Secondary | ICD-10-CM

## 2023-11-29 NOTE — Telephone Encounter (Signed)
Patient was scheduled tomorrow 11/30/23 for a AWV. Spoke to patient's husband earlier to confirm appointment. Patient's husband stated that they were coming in to get some medication for her sinus drainage. Advised patient's husband that she would need to see a provider to get medication. Patient's husband stated that his wife needs to see an ENT because she has had problems with sinus drainage down the back of her throat for months. Patient was offered an appointment with a NP which they declined stating that they would only see Dr. Darrick Huntsman or a ENT and requested a referral to an ENT. Patient's husband requested that appointments for them be cancelled tomorrow.

## 2023-12-01 NOTE — Telephone Encounter (Signed)
LMTCB

## 2023-12-07 DIAGNOSIS — J309 Allergic rhinitis, unspecified: Secondary | ICD-10-CM | POA: Diagnosis not present

## 2023-12-07 DIAGNOSIS — J300 Vasomotor rhinitis: Secondary | ICD-10-CM | POA: Diagnosis not present

## 2023-12-07 DIAGNOSIS — R0982 Postnasal drip: Secondary | ICD-10-CM | POA: Diagnosis not present

## 2024-01-10 ENCOUNTER — Other Ambulatory Visit: Payer: Self-pay | Admitting: Internal Medicine

## 2024-01-12 ENCOUNTER — Ambulatory Visit (INDEPENDENT_AMBULATORY_CARE_PROVIDER_SITE_OTHER): Payer: Medicare Other

## 2024-01-12 DIAGNOSIS — I495 Sick sinus syndrome: Secondary | ICD-10-CM

## 2024-01-12 LAB — CUP PACEART REMOTE DEVICE CHECK
Battery Remaining Longevity: 80 mo
Battery Remaining Percentage: 94 %
Battery Voltage: 3.04 V
Brady Statistic RV Percent Paced: 79 %
Date Time Interrogation Session: 20250123020015
Implantable Lead Connection Status: 753985
Implantable Lead Implant Date: 20240424
Implantable Lead Location: 753860
Implantable Lead Model: 1948
Implantable Pulse Generator Implant Date: 20240424
Lead Channel Impedance Value: 740 Ohm
Lead Channel Pacing Threshold Amplitude: 3.5 V
Lead Channel Pacing Threshold Pulse Width: 0.5 ms
Lead Channel Sensing Intrinsic Amplitude: 10.2 mV
Lead Channel Setting Pacing Amplitude: 5 V
Lead Channel Setting Pacing Pulse Width: 0.5 ms
Lead Channel Setting Sensing Sensitivity: 2 mV
Pulse Gen Model: 1272
Pulse Gen Serial Number: 8124077

## 2024-01-19 ENCOUNTER — Ambulatory Visit: Payer: Medicare Other | Admitting: Podiatry

## 2024-01-19 ENCOUNTER — Telehealth: Payer: Self-pay

## 2024-01-19 DIAGNOSIS — Z91198 Patient's noncompliance with other medical treatment and regimen for other reason: Secondary | ICD-10-CM

## 2024-01-19 NOTE — Progress Notes (Signed)
1. Failure to attend appointment with reason given    Appointment rescheduled by patient.

## 2024-01-19 NOTE — Telephone Encounter (Signed)
Alert received from [employer] for One ventricular high rate that was 57 seconds at a rate of 181 bpm, rhythm is irregular and most likely AF with RVR, on OAC. Presenting rhythm was 75-120 bpm.  Patient reports palpitations in the mornings and evenings (unknown amount of time). Denies shortness of breath, fatigue. Unknown medication compliance as there is confusion between a provider stopping BB although husband is still giving her the medication. Apt made with Luella Cook, NP in the [place] to follow up on AF w/ increased VR's/symptoms.

## 2024-01-24 ENCOUNTER — Encounter: Payer: Self-pay | Admitting: Cardiovascular Disease

## 2024-01-24 ENCOUNTER — Ambulatory Visit: Payer: Medicare Other | Attending: Cardiovascular Disease | Admitting: Cardiovascular Disease

## 2024-01-24 ENCOUNTER — Encounter: Payer: Medicare Other | Admitting: Cardiology

## 2024-01-24 VITALS — BP 104/60 | HR 84 | Ht 64.0 in | Wt 133.2 lb

## 2024-01-24 DIAGNOSIS — I4821 Permanent atrial fibrillation: Secondary | ICD-10-CM

## 2024-01-24 DIAGNOSIS — I5032 Chronic diastolic (congestive) heart failure: Secondary | ICD-10-CM

## 2024-01-24 DIAGNOSIS — I779 Disorder of arteries and arterioles, unspecified: Secondary | ICD-10-CM | POA: Diagnosis not present

## 2024-01-24 DIAGNOSIS — I10 Essential (primary) hypertension: Secondary | ICD-10-CM | POA: Diagnosis not present

## 2024-01-24 NOTE — Patient Instructions (Signed)
 Medication Instructions:  STOP Torsemide  STOP Potassium  *If you need a refill on your cardiac medications before your next appointment, please call your pharmacy*   Follow-Up: At [facility], you and your health needs are our priority.  As part of our continuing mission to provide you with exceptional heart care, we have created designated Provider Care Teams.  These Care Teams include your primary Cardiologist (physician) and Advanced Practice Providers (APPs -  Physician Assistants and Nurse Practitioners) who all work together to provide you with the care you need, when you need it.  We recommend signing up for the patient portal called MyChart.  Sign up information is provided on this After Visit Summary.  MyChart is used to connect with patients for Virtual Visits (Telemedicine).  Patients are able to view lab/test results, encounter notes, upcoming appointments, etc.  Non-urgent messages can be sent to your provider as well.   To learn more about what you can do with MyChart, go to [email or web address].    Your next appointment:   6 month(s)  Provider:   Deatrice Cage, MD

## 2024-01-24 NOTE — Progress Notes (Signed)
 Cardiology Office Note   Date:  01/24/2024   ID:  [name] [name], DOB 07/10/1936, MRN [id number]  PCP:  Marylynn Verneita CROME, MD  Cardiologist:   Deatrice Cage, MD   Chief Complaint  Patient presents with   Follow-up    Patient concerned with elevated HR.  With last episode of tachycardia this morning.  Spouse is concerned of neurologist recommendation of stopping Metoprolol .         History of Present Illness: [name] [name] is a 88 y.o. female who presents for a followup visit regarding permanent atrial fibrillation.   She has known history of  atrial fibrillation/flutter status post multiple cardioversions in the past but currently with chronic atrial fibrillation being treated with rate control.  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    Most recent cardiac catheterization in 2020 showed minimal irregularities with no evidence of obstructive coronary artery disease, normal ejection fraction mildly elevated left ventricular end-diastolic pressure.  She was hospitalized in April, 2024 with syncope with prolonged pauses.  She underwent single-lead pacemaker placement by Dr. Fernande.  Echocardiogram during that admission showed normal LV systolic function with mild to moderate mitral regurgitation and mild to moderate tricuspid regurgitation.  She had progressive bradycardia that required stopping diltiazem  and decreasing metoprolol .  She had an echocardiogram done in June which showed normal LV systolic function, indeterminate diastolic function, mild pulmonary hypertension, mild mitral regurgitation, moderate to severe tricuspid regurgitation with severely dilated IVC.  Her symptoms improved with diuresis.  She was started on small dose torsemide  due to heart failure. She does have dementia and has been following up with neurology.  Her appetite has not been good and she lost 17 pounds since October.  The husband reports episodes of fast heart  rate intermittently.  He gave an extra dose of metoprolol .  The patient is completely asymptomatic though.   Past Medical History:  Diagnosis Date   Actinic keratosis    Arthritis    Atrial flutter (HCC) 02/2011   s/p cardioversion    Chest pain    a. H/o cardiac cath x 2-> 2012 -->nl cors;  b. 12/2016 MV: EF 61%, small region of mild perfusion defect in the apical anteroseptal region c/w breast attenuation, no ischemia-->Low risk; c. 06/2019 MV: Mod size, mild inflat ischemia, EF 59%. Cor and Ao Ca2+. Inflat defect more pronounced on this study compared to last; c. 07/2019 Cath: LM nl, LAD min irregs, LCX nl, OM1/2/3 nl, RCA min irregs.   CKD (chronic kidney disease), stage III (HCC)    Concussion with no loss of consciousness 09/01/2016   Cystocele    Degenerative disorder of bone    GERD (gastroesophageal reflux disease)    Headache(784.0)    chronic   Hiatal hernia    Hyperlipidemia    Hypertension    Influenza 01/10/2017   Knee fracture    Migraines    Mobitz type 2 second degree atrioventricular block    a. felt to be 2/2 amiodarone , resolved with decreased amiodarone  dose.  Amio since d/c'd.   Permanent atrial fibrillation (HCC)    a. status post multiple DCCVs; b. 2018 - eval for PVI but opted for rate control.   PONV (postoperative nausea and vomiting)    oxycodone and codiene cause N/V    Pre-syncope    a. In setting of dehydration and AKI in the past.   Sleep apnea    Squamous cell carcinoma of skin 11/10/2020  left distal posterior deltoid (EDC 01/15/2021)   Tachycardia induced cardiomyopathy (HCC)    a. Resolved;  b. 08/2017 Echo: EF 50-55%, no rwma, mild MR, mildly to mod dil LA/RA; c. 02/2018 Echo: EF 55-60%, mild MR. Mildly dil LA. Nl RVSP. PASP .   Venous insufficiency    Vertigo     Past Surgical History:  Procedure Laterality Date   ABDOMINAL HYSTERECTOMY  1990   APPENDECTOMY     AUGMENTATION MAMMAPLASTY Bilateral 1986   implants   AUGMENTATION  MAMMAPLASTY  1990   AUGMENTATION MAMMAPLASTY  2011   CARDIAC CATHETERIZATION     CARDIOVERSION     x 3   CARDIOVERSION     CARDIOVERSION N/A 02/07/2017   Procedure: CARDIOVERSION;  Surgeon: Deatrice DELENA Cage, MD;  Location: [facility] OR[facility];  Service: Cardiovascular;  Laterality: N/A;   CARDIOVERSION N/A 07/22/2017   Procedure: Cardioversion;  Surgeon: Perla Evalene PARAS, MD;  Location: [facility];  Service: Cardiovascular;  Laterality: N/A;   CATARACT EXTRACTION W/PHACO Right 09/21/2016   Procedure: CATARACT EXTRACTION PHACO AND INTRAOCULAR LENS PLACEMENT (IOC);  Surgeon: Elsie Carmine, MD;  Location: [facility];  Service: Ophthalmology;  Laterality: Right;  US  44.1 AP% 16.5 CDE 7.30 Fluid Pack Lot #[id number]   CATARACT EXTRACTION W/PHACO Left 10/19/2016   Procedure: CATARACT EXTRACTION PHACO AND INTRAOCULAR LENS PLACEMENT (IOC);  Surgeon: Elsie Carmine, MD;  Location: [facility];  Service: Ophthalmology;  Laterality: Left;  US  53.7 AP% 19.5 CDE 10.45 Fluid pack lot # [id number]   CHOLECYSTECTOMY     COMBINED AUGMENTATION MAMMAPLASTY AND ABDOMINOPLASTY     JOINT REPLACEMENT Left 06/04/2013   left knee   KNEE ARTHROSCOPY Right 08/16/2016   Procedure: ARTHROSCOPY KNEE, tear posterior horn medial meniscus, tear anterior and posterior horns of lateral meniscus, chondromalacia of lateral compartment grade 3 patella and grade 4 medial;  Surgeon: Lynwood SHAUNNA Hue, MD;  Location: [facility];  Service: Orthopedics;  Laterality: Right;   LEFT HEART CATH AND CORONARY ANGIOGRAPHY Left 07/27/2019   Procedure: LEFT HEART CATH AND CORONARY ANGIOGRAPHY;  Surgeon: Cage Deatrice DELENA, MD;  Location: [facility];  Service: Cardiovascular;  Laterality: Left;   MASTECTOMY  1986   nipple sparing mastectomy/Bilateral with silicone  breast implants, s/p saline replacements   Multiple orthopedic procedures     NOSE SURGERY     PACEMAKER IMPLANT N/A 04/13/2023   Procedure: PACEMAKER IMPLANT;  Surgeon: Fernande Elspeth BROCKS, MD;   Location: [facility];  Service: Cardiovascular;  Laterality: N/A;   TEE WITHOUT CARDIOVERSION N/A 09/26/2017   Procedure: TRANSESOPHAGEAL ECHOCARDIOGRAM (TEE);  Surgeon: Okey Vina GAILS, MD;  Location: [facility];  Service: Cardiovascular;  Laterality: N/A;   TOTAL KNEE ARTHROPLASTY Left      Current Outpatient Medications  Medication Sig Dispense Refill   acetaminophen  (TYLENOL ) 500 MG tablet Take 1,000 mg by mouth every 6 (six) hours as needed (pain).      ALPRAZolam  (XANAX ) 0.25 MG tablet Take 1 tablet (0.25 mg total) by mouth 2 (two) times daily as needed for anxiety. 20 tablet 0   aluminum-magnesium  hydroxide 200-200 MG/5ML suspension Take 20 mLs by mouth every 6 (six) hours as needed for indigestion.      amLODipine  (NORVASC ) 2.5 MG tablet Take 2.5 mg by mouth daily as needed.     busPIRone  (BUSPAR ) 10 MG tablet TAKE ONE TABLET BY MOUTH TWICE DAILY 60 tablet 2   Carboxymethylcellul-Glycerin (LUBRICATING EYE DROPS OP) Place 1 drop into both eyes daily as needed (dry  eyes).     cholecalciferol  (VITAMIN D ) 1000 units tablet Take 1,000 Units by mouth daily.     ELIQUIS  5 MG TABS tablet TAKE ONE TABLET TWICE DAILY 60 tablet 5   metoprolol  tartrate (LOPRESSOR ) 25 MG tablet Take 1 tablet (25 mg total) by mouth 2 (two) times daily. 180 tablet 2   pantoprazole  (PROTONIX ) 40 MG tablet TAKE 1 TABLET BY MOUTH DAILY 90 tablet 1   potassium chloride  (KLOR-CON  M) 10 MEQ tablet TAKE ONE TABLET BY MOUTH EVERY DAY 30 tablet 3   pravastatin  (PRAVACHOL ) 20 MG tablet TAKE 1 TABLET BY MOUTH DAILY 90 tablet 0   sodium chloride  (OCEAN) 0.65 % SOLN nasal spray Place 1 spray into both nostrils daily as needed for congestion.     torsemide  (DEMADEX ) 10 MG tablet Take 1 tablet (10 mg total) by mouth daily. 90 tablet 3   No current facility-administered medications for this visit.    Allergies:   Cyclobenzaprine, Iodine , Amiodarone , Biaxin [clarithromycin], Codeine, Digoxin  and related, Diltiazem ,  Enalapril, Famotidine , Fluocinonide, Iodinated contrast media, Iodine -131, Losartan  potassium, Omnicef [cefdinir], Oxycodone, Promethazine , Sertraline , Tizanidine hcl, Warfarin and related, and Xarelto  [rivaroxaban ]    Social History:  The patient  reports that she has never smoked. She has never used smokeless tobacco. She reports that she does not drink alcohol  and does not use drugs.   Family History:  The patient's family history includes Breast cancer in her maternal grandmother and sister; Breast cancer (age of onset: 53) in her sister; Breast cancer (age of onset: 19) in her sister; Breast cancer (age of onset: 59) in her sister; Diabetes in her brother; Esophageal cancer in her brother; Heart disease in her mother and son; Kidney failure in her brother; Stomach cancer in her father.    ROS:  Please see the history of present illness.   Otherwise, review of systems are positive for none.   All other systems are reviewed and negative.    PHYSICAL EXAM: VS:  BP 104/60 (BP Location: Left Arm, Patient Position: Sitting, Cuff Size: Normal)   Pulse 84   Ht 5' 4 (1.626 m)   Wt 133 lb 3.2 oz (60.4 kg)   SpO2 95%   BMI 22.86 kg/m  , BMI Body mass index is 22.86 kg/m. GEN: Well nourished, well developed, in no acute distress  HEENT: Intact Neck: no JVD, carotid bruits, or masses Cardiac: Irregularly irregular.; no murmurs, rubs, or gallops, no leg edema  Respiratory:  clear to auscultation bilaterally, normal work of breathing GI: soft, nontender, nondistended, + BS MS: no deformity or atrophy  Skin: warm and dry, no rash Neuro:  Strength and sensation are intact Psych: She is not oriented.  She did not know the date, the place or who is the president.   EKG:  EKG is ordered today. The ekg ordered today demonstrates   Atrial fibrillation Right bundle branch block T wave abnormality, consider inferolateral ischemia       Recent Labs: 04/08/2023: TSH 0.971 04/13/2023: Magnesium   2.0 05/30/2023: ALT 25; Hemoglobin 11.7; Platelets 243 09/22/2023: BUN 17; Creatinine, Ser 1.03; Potassium 4.8; Sodium 136    Lipid Panel    Component Value Date/Time   CHOL 149 12/29/2022 1515   TRIG 65.0 12/29/2022 1515   HDL 66.10 12/29/2022 1515   CHOLHDL 2 12/29/2022 1515   VLDL 13.0 12/29/2022 1515   LDLCALC 70 12/29/2022 1515   LDLDIRECT 72.0 12/29/2022 1515      Wt Readings from Last 3 Encounters:  01/24/24 133 lb 3.2 oz (60.4 kg)  10/14/23 150 lb 3.2 oz (68.1 kg)  09/22/23 150 lb 3.2 oz (68.1 kg)       ASSESSMENT AND PLAN:  1.  Tachybradycardia syndrome status post pacemaker placement: Pacemaker interrogation last month was unremarkable.  2.  Permanent atrial fibrillation: Reported intermittent tachycardia but the patient is asymptomatic.  Continue metoprolol  25 mg twice daily for now.  I reviewed most recent office visits and her heart rate was always below 100.  If tachycardia becomes an issue, we can increase metoprolol  and stop amlodipine .  3. Essential hypertension: Blood pressure is controlled on small dose metoprolol  and amlodipine   4. History of tachycardia-induced cardiomyopathy.   Most recent echocardiogram showed normal LV systolic function.  5.  Carotid artery disease: Carotid Doppler in April of last showed moderate left carotid stenosis.  Given age and dementia, no need to repeat as she is unlikely to be a candidate for surgery.  6.  Hyperlipidemia: Continue treatment with pravastatin .  Most recent LDL was 70.  7.  Chronic diastolic heart failure: She had volume overload that responded to small dose torsemide .  However, her appetite has been poor and she lost 17 pounds since October.  I elected to discontinue torsemide  and potassium today.  8.  Dementia: Followed by neurology.   Disposition:   FU with me in 6 months.   Signed, Deatrice Cage, MD 01/24/24 [facility] [place], [place] [phone]

## 2024-01-25 ENCOUNTER — Encounter: Payer: Medicare Other | Admitting: Cardiology

## 2024-01-25 ENCOUNTER — Ambulatory Visit: Payer: Medicare Other | Attending: Cardiology | Admitting: Cardiology

## 2024-01-25 VITALS — BP 121/79 | HR 107 | Ht 64.0 in | Wt 133.6 lb

## 2024-01-25 DIAGNOSIS — D6869 Other thrombophilia: Secondary | ICD-10-CM

## 2024-01-25 DIAGNOSIS — I495 Sick sinus syndrome: Secondary | ICD-10-CM

## 2024-01-25 DIAGNOSIS — I4821 Permanent atrial fibrillation: Secondary | ICD-10-CM | POA: Diagnosis not present

## 2024-01-25 DIAGNOSIS — Z950 Presence of cardiac pacemaker: Secondary | ICD-10-CM | POA: Diagnosis not present

## 2024-01-25 LAB — CUP PACEART INCLINIC DEVICE CHECK
Battery Remaining Longevity: 111 mo
Battery Voltage: 3.02 V
Brady Statistic RV Percent Paced: 74 %
Date Time Interrogation Session: 20250205133541
Implantable Lead Connection Status: 753985
Implantable Lead Implant Date: 20240424
Implantable Lead Location: 753860
Implantable Lead Model: 1948
Implantable Pulse Generator Implant Date: 20240424
Lead Channel Impedance Value: 700 Ohm
Lead Channel Pacing Threshold Amplitude: 0.5 V
Lead Channel Pacing Threshold Amplitude: 0.5 V
Lead Channel Pacing Threshold Pulse Width: 0.5 ms
Lead Channel Pacing Threshold Pulse Width: 0.5 ms
Lead Channel Sensing Intrinsic Amplitude: 10.1 mV
Lead Channel Setting Pacing Amplitude: 2.5 V
Lead Channel Setting Pacing Pulse Width: 0.5 ms
Lead Channel Setting Sensing Sensitivity: 2 mV
Pulse Gen Model: 1272
Pulse Gen Serial Number: 8124077

## 2024-01-25 MED ORDER — METOPROLOL SUCCINATE ER 25 MG PO TB24: 25.0000 mg | ORAL_TABLET | Freq: Two times a day (BID) | ORAL | 1 refills | Status: DC

## 2024-01-25 NOTE — Progress Notes (Signed)
 Electrophysiology Clinic Note    Date:  01/25/2024  Patient ID:  [name] [name], [name] [name] December 21, 1935, MRN [id number] PCP:  Marylynn Verneita CROME, MD  Cardiologist:  Deatrice Cage, MD Electrophysiologist: Elspeth Sage, MD   Discussed the use of AI scribe software for clinical note transcription with the patient, who gave verbal consent to proceed.   Patient Profile    Chief Complaint: high ventricular rate on PPM, high threshold  History of Present Illness: [name] [name] is a 88 y.o. female with PMH notable for perm afib/aflutter, HTN, tachy-brady s/p PPM, cardiomyopathy (now resolved); seen today for Elspeth Sage, MD for acute visit due to remote monitoring alert for PPM.   She was admitted to [facility] hospital 03/2023 after syncopal episode. Several sinus pauses noted, and so PPM was implanted. She last saw Dr. Sage 06/2023 for routine 91d follow-up appt. EKG with underlying competitive VP, so lower rate limit increased to 60bpm. She was having episodes of afib w RVR, so lopressor  increased to 25mg  BID.   Device clinic was alerted 1/30 for AFib w RVR at 180 for 57 seconds.  She saw Dr. Cage yesterday,  noting that patient was asymptomatic during tachycardic episodes, continued lopressor  at 25mg  BID and amlodipine .   On follow-up today, her husband is very concerned about her elevated heart rate, stating that it is often in the 120-130s in the morning and evening. Patient states that she sometimes has palpitations, husband states that she has not complained of any symptoms. She has been taking amlodipine  PRN for BP > 160 systolic, has not needed a dose in a long time.   She continues to take eliuqis BID, no bleeding concerns.     she denies chest pain, dyspnea, PND, orthopnea, nausea, vomiting, dizziness, syncope, edema, weight gain, or early satiety.     Device Information: St. Jude single chamber PPM, imp 03/2023; tachy-brady  AAD History: Amiodarone      ROS:  Please see the history of  present illness. All other systems are reviewed and otherwise negative.    Physical Exam    VS:  BP 121/79 (BP Location: Left Arm, Patient Position: Sitting, Cuff Size: Normal)   Pulse (!) 107   Ht 5' 4 (1.626 m)   Wt 133 lb 9.6 oz (60.6 kg)   SpO2 96%   BMI 22.93 kg/m  BMI: Body mass index is 22.93 kg/m.  Wt Readings from Last 3 Encounters:  01/25/24 133 lb 9.6 oz (60.6 kg)  01/24/24 133 lb 3.2 oz (60.4 kg)  10/14/23 150 lb 3.2 oz (68.1 kg)     GEN- The patient is well appearing, alert and oriented x 3 today.   Lungs- Clear to ausculation bilaterally, normal work of breathing.  Heart- Irregularly irregular rate and rhythm, no murmurs, rubs or gallops Extremities- No peripheral edema, warm, dry Skin-  device pocket well-healed, no tethering   Device interrogation done today and reviewed by myself:  Battery 9 years Lead thresholds, impedence, sensing stable  High VR episodes Elevated histograms Significant drop in pacing in 12/2023 that corresponds to significant elevation in HR Turned OFF auto-capture, programmed at 2.5v @ 0.79ms   Studies Reviewed   Previous EP, cardiology notes.    EKG is not ordered. Personal review of EKG from  01/24/2024  shows:  Aflutter, rate 84bpm; RBBB       TTE, 06/06/2023  1. Left ventricular ejection fraction, by estimation, is 55 to 60%. The left ventricle has normal function.  2. Right ventricular systolic function is normal. The right ventricular size is moderately enlarged. There is mildly elevated pulmonary artery systolic pressure.   3. Left atrial size was severely dilated.   4. Right atrial size was severely dilated.   5. The mitral valve is normal in structure. Mild mitral valve regurgitation.   6. Tricuspid valve regurgitation is moderate to severe.   7. The inferior vena cava is dilated in size with <50% respiratory variability, suggesting right atrial pressure of 15 mmHg.    TTE, 04/08/2023  1. Left ventricular ejection  fraction, by estimation, is 55 to 60%. The left ventricle has normal function. The left ventricle has no regional wall motion abnormalities. Left ventricular diastolic parameters are indeterminate.   2. Right ventricular systolic function is normal. The right ventricular size is mildly enlarged. There is moderately elevated pulmonary artery systolic pressure. The estimated right ventricular systolic pressure is 51.8 mmHg.   3. Left atrial size was severely dilated.   4. Right atrial size was severely dilated.   5. The mitral valve is normal in structure. Mild to moderate mitral valve regurgitation. No evidence of mitral stenosis.   6. Tricuspid valve regurgitation is mild to moderate.   7. The aortic valve is normal in structure. Aortic valve regurgitation is not visualized. No aortic stenosis is present.   8. The inferior vena cava is normal in size with greater than 50% respiratory variability, suggesting right atrial pressure of 3 mmHg.     Assessment and Plan     #) Perm afib/flutter #) elevated ventricular rates Elevated ventricular rates as of late on 25mg  lopressor  BID Will transition to 25mg  toprol  BID to provide more 24h coverage Recommend patient check BP daily during the next few weeks to confirm patient is not hypotensive.  #) Hypercoag d/t perm afib CHA2DS2-VASc Score = at least 5 [CHF History: 0, HTN History: 1, Diabetes History: 0, Stroke History: 0, Vascular Disease History: 1, Age Score: 2, Gender Score: 1].  Therefore, the patient's annual risk of stroke is 7.2 %.    Stroke ppx - 2.5mg  eliquis  BID, appropriately reduced for age, weight No bleeding concerns    #) Tachy-brady s/p PPM Lead measurements stable Turned off auto-capture See paceart for details         Current medicines are reviewed at length with the patient today.   The patient has concerns regarding her medicines.  The following changes were made today:   STOP lopressor  START toprol  25mg   BID  Labs/ tests ordered today include:  No orders of the defined types were placed in this encounter.    Disposition: Follow up with Dr. Fernande or EP APP in 12 months, sooner if needed   Signed, Chantal Needle, NP  01/25/24  1:27 PM  Electrophysiology [facility]

## 2024-01-25 NOTE — Patient Instructions (Signed)
 Medication Instructions:   STOP Metoprolol  TARTRATE  START Metoprolol  SUCCINATE 25 mg twice daily   *If you need a refill on your cardiac medications before your next appointment, please call your pharmacy*  Follow-Up: At [facility], you and your health needs are our priority.  As part of our continuing mission to provide you with exceptional heart care, we have created designated Provider Care Teams.  These Care Teams include your primary Cardiologist (physician) and Advanced Practice Providers (APPs -  Physician Assistants and Nurse Practitioners) who all work together to provide you with the care you need, when you need it.  We recommend signing up for the patient portal called MyChart.  Sign up information is provided on this After Visit Summary.  MyChart is used to connect with patients for Virtual Visits (Telemedicine).  Patients are able to view lab/test results, encounter notes, upcoming appointments, etc.  Non-urgent messages can be sent to your provider as well.   To learn more about what you can do with MyChart, go to [email or web address].    Your next appointment:   12 month(s)  Provider:   Suzann Riddle, NP  Other Instructions Monitor BP at home- please call with any changes or concerns.

## 2024-01-30 ENCOUNTER — Telehealth: Payer: Self-pay | Admitting: Cardiology

## 2024-01-30 NOTE — Telephone Encounter (Signed)
 Here are histograms.Show significant burden of faster rates.  Did not hear back from patient this pm but do suspect heart rates are staying elevated throughout the day. Presenting was at noon today rate of 150bpm.

## 2024-01-30 NOTE — Telephone Encounter (Addendum)
 Patient's husband reports elevated heart rates, she is asx.  Recent OV with Suzann Riddle, NP notes "elevated ventricular rate" noted as AF w/RVR.   Changed to Metoprolol  Succinate 25 mg bid  Need to assess patients bp's, ? Need to see AF clinic for better rate control.   LM for patient's husband to call back and discuss.

## 2024-01-30 NOTE — Telephone Encounter (Signed)
 Patient stopped by stating the medication metoprolol  is not working because her heartrate is still high/worse and is very concerned. Was told to contact provider if any issues. Please contact patient.

## 2024-01-30 NOTE — Telephone Encounter (Signed)
 Spoke with patient. They aren't home but will call to talk through sending a transmission when they get home. Device clinic number given.

## 2024-01-30 NOTE — Telephone Encounter (Signed)
 Pt called back and has sent in that transmission and would like a call back

## 2024-01-30 NOTE — Telephone Encounter (Signed)
 Called patient and spoke with her and husband. He stated since the last visit- they started the new metoprolol  however he feels it has worsened with the HR readings. Patient husband states her HR this morning was 143. He states it is this way every morning, and even throughout the day. Patient denies lightheaded or dizziness. States that her blood pressures have been okay- he did say they become low at times (they were not at home during the call to give me readings)   Advised I would route to NP (however, she was out of office today) and also route to device clinic to see about having a download obtained since she was having complaints of higher readings again.   Thanks!

## 2024-01-31 ENCOUNTER — Ambulatory Visit: Payer: Medicare Other | Admitting: Podiatry

## 2024-01-31 NOTE — Telephone Encounter (Signed)
Pt husband called back and would like a call back today

## 2024-01-31 NOTE — Telephone Encounter (Signed)
BP yesterday was 8:15 AM 131/70 HR 143  9:15 PM yesterday 120/62 121 HR   He is trying to get a blood pressure this morning but the BP reading keeps saying error.  Patient husband states they are taking the new medication, and the HR happens mostly in the morning, but it is elevated in the evenings as well.   Patient husband states someone called him about coming in today, I did not see this mentioned, but will contact device clinic as well to see if they had contacted them anymore. He states if they do need to bring her in- he needs it to be in the afternoon.

## 2024-01-31 NOTE — Telephone Encounter (Signed)
Spoke with DOD Dr.Gollan who reviewed, agreed to increase Metoprolol Succinate to 50 mg twice daily- advised to get in with AFIB CLINIC if possible, or having soon follow up.   I contacted patient, spoke with husband, advised of the following- patient husband will increase to 50 mg twice daily, monitor BP/HR- call us with any lower BP readings, or if patient becomes symptomatic.   Patient husband also aware appointment with Dr.Klein scheduled for next Thursday 02/20 at 11:20 AM. Patient husband verbalized understanding, but aware to call this week if needed.

## 2024-01-31 NOTE — Telephone Encounter (Signed)
Spoke with patient/husband this am to obtain the following (per Suzann's request):   1 - please confirm that she is taking METOP SUCCINATE 25 BID. I stopped METOP TARTRATE during appt last week.    Yes, husband says they did make the change, if anything seems to be getting worse.   2 - please obtain BP readings   Monitor this am having trouble reading. Patient reports feeling "nervous and jittery" but can't really feel the fast heart rates  124/69, HR 111 this am.  131/70  HR 143 yesterday am. 120/62  HR 121  9:15pm yesterday.   PRESENTING ON TRANSMISSION YESTERDAY RATES 150BPM.   3 - is HR always elevated, or just in mornings?   Heart rates are elevated throughout the day per husband.

## 2024-02-01 ENCOUNTER — Other Ambulatory Visit: Payer: Self-pay | Admitting: Cardiovascular Disease

## 2024-02-01 ENCOUNTER — Ambulatory Visit: Payer: Medicare Other | Admitting: Dermatology

## 2024-02-01 DIAGNOSIS — I4821 Permanent atrial fibrillation: Secondary | ICD-10-CM

## 2024-02-01 DIAGNOSIS — I10 Essential (primary) hypertension: Secondary | ICD-10-CM

## 2024-02-09 ENCOUNTER — Telehealth: Payer: Self-pay | Admitting: Internal Medicine

## 2024-02-09 ENCOUNTER — Ambulatory Visit: Payer: Medicare Other | Attending: Internal Medicine | Admitting: Internal Medicine

## 2024-02-09 VITALS — BP 138/70 | HR 99 | Ht 63.0 in | Wt 136.0 lb

## 2024-02-09 DIAGNOSIS — I779 Disorder of arteries and arterioles, unspecified: Secondary | ICD-10-CM | POA: Diagnosis not present

## 2024-02-09 DIAGNOSIS — Z950 Presence of cardiac pacemaker: Secondary | ICD-10-CM

## 2024-02-09 DIAGNOSIS — I4821 Permanent atrial fibrillation: Secondary | ICD-10-CM

## 2024-02-09 DIAGNOSIS — I495 Sick sinus syndrome: Secondary | ICD-10-CM | POA: Diagnosis not present

## 2024-02-09 DIAGNOSIS — I10 Essential (primary) hypertension: Secondary | ICD-10-CM | POA: Diagnosis not present

## 2024-02-09 LAB — CUP PACEART INCLINIC DEVICE CHECK
Battery Remaining Longevity: 116 mo
Battery Voltage: 3.04 V
Brady Statistic RV Percent Paced: 1.7 %
Date Time Interrogation Session: 20250220193757
Implantable Lead Connection Status: 753985
Implantable Lead Implant Date: 20240424
Implantable Lead Location: 753860
Implantable Lead Model: 1948
Implantable Pulse Generator Implant Date: 20240424
Lead Channel Impedance Value: 700 Ohm
Lead Channel Pacing Threshold Amplitude: 0.5 V
Lead Channel Pacing Threshold Amplitude: 0.5 V
Lead Channel Pacing Threshold Pulse Width: 0.5 ms
Lead Channel Pacing Threshold Pulse Width: 0.5 ms
Lead Channel Sensing Intrinsic Amplitude: 8.3 mV
Lead Channel Setting Pacing Amplitude: 2.5 V
Lead Channel Setting Pacing Pulse Width: 0.5 ms
Lead Channel Setting Sensing Sensitivity: 2 mV
Pulse Gen Model: 1272
Pulse Gen Serial Number: 8124077

## 2024-02-09 MED ORDER — METOPROLOL SUCCINATE ER 25 MG PO TB24: 25.0000 mg | ORAL_TABLET | Freq: Every day | ORAL | 1 refills | Status: DC

## 2024-02-09 MED ORDER — APIXABAN 2.5 MG PO TABS: 2.5000 mg | ORAL_TABLET | Freq: Two times a day (BID) | ORAL | 1 refills | Status: DC

## 2024-02-09 MED ORDER — FUROSEMIDE 20 MG PO TABS: ORAL_TABLET | ORAL | 0 refills | Status: DC

## 2024-02-09 MED ORDER — AMIODARONE HCL 200 MG PO TABS
ORAL_TABLET | ORAL | 2 refills | Status: DC
Start: 2024-02-09 — End: 2024-07-04

## 2024-02-09 NOTE — Progress Notes (Signed)
Patient Care Team: Sherlene Shams, MD as PCP - General (Internal Medicine) Iran Ouch, MD as PCP - Cardiology (Cardiology) Duke Salvia, MD as PCP - Electrophysiology (Cardiology) [place] Ouch, MD as Consulting Physician (Cardiology) Otho Ket, RN as [facility] Care Management   HPI  [name] [name] is a 88 y.o. female seen in followup for pacemaker-single chamber (passive lead)  Abbott  implanted 4/24 for syncope, AFib-permanent with tachybrady, the latter requiring atropine.Turns afib was not permanent with ECG 7/24 ad 10/24 showing V pacing with retrograde A conduction and 5/24<< sinus with intrinsic conduction.  Hx of tachyassoc cardiomyopathy, now resolved.   Dementia but she and her husband both say she will remember today's visit tomorrow  Her husband has noted over recent weeks that her heart rate has been faster.  Metoprolol doses have been gradually increased.  Denies shortness of breath.  DATE TEST EF   9/23 MYOVIEW   57 % No ischemia  4/24 Echo  60% TR mild/mod  6/24 Echo   55-60 % BAE severe TRmod/severe               Date Cr K Hgb  6/24 0.83 3.9 11.7<<12.9           Thromboembolic risk factors ( age  -2, HTN-1, Gender-1) for a CHADSVASc Score of >=4    Records and Results Reviewed   Past Medical History:  Diagnosis Date   Actinic keratosis    Arthritis    Atrial flutter (HCC) 02/2011   s/p cardioversion    Chest pain    a. H/o cardiac cath x 2-> 2012 -->nl cors;  b. 12/2016 MV: EF 61%, small region of mild perfusion defect in the apical anteroseptal region c/w breast attenuation, no ischemia-->Low risk; c. 06/2019 MV: Mod size, mild inflat ischemia, EF 59%. Cor and Ao Ca2+. Inflat defect more pronounced on this study compared to last; c. 07/2019 Cath: LM nl, LAD min irregs, LCX nl, OM1/2/3 nl, RCA min irregs.   CKD (chronic kidney disease), stage III (HCC)    Concussion with no loss of consciousness 09/01/2016    Cystocele    Degenerative disorder of bone    GERD (gastroesophageal reflux disease)    Headache(784.0)    chronic   Hiatal hernia    Hyperlipidemia    Hypertension    Influenza 01/10/2017   Knee fracture    Migraines    Mobitz type 2 second degree atrioventricular block    a. felt to be 2/2 amiodarone, resolved with decreased amiodarone dose.  Amio since d/c'd.   Permanent atrial fibrillation (HCC)    a. status post multiple DCCVs; b. 2018 - eval for PVI but opted for rate control.   PONV (postoperative nausea and vomiting)    oxycodone and codiene cause N/V    Pre-syncope    a. In setting of dehydration and AKI in the past.   Sleep apnea    Squamous cell carcinoma of skin 11/10/2020   left distal posterior deltoid (EDC 01/15/2021)   Tachycardia induced cardiomyopathy (HCC)    a. Resolved;  b. 08/2017 Echo: EF 50-55%, no rwma, mild MR, mildly to mod dil LA/RA; c. 02/2018 Echo: EF 55-60%, mild MR. Mildly dil LA. Nl RVSP. PASP .   Venous insufficiency    Vertigo     Past Surgical History:  Procedure Laterality Date   ABDOMINAL HYSTERECTOMY  1990   APPENDECTOMY  AUGMENTATION MAMMAPLASTY Bilateral 1986   implants   AUGMENTATION MAMMAPLASTY  1990   AUGMENTATION MAMMAPLASTY  2011   CARDIAC CATHETERIZATION     CARDIOVERSION     x 3   CARDIOVERSION     CARDIOVERSION N/A 02/07/2017   Procedure: CARDIOVERSION;  Surgeon: Iran Ouch, MD;  Location: [facility] ORS;  Service: Cardiovascular;  Laterality: N/A;   CARDIOVERSION N/A 07/22/2017   Procedure: Cardioversion;  Surgeon: Antonieta Iba, MD;  Location: [facility] ORS;  Service: Cardiovascular;  Laterality: N/A;   CATARACT EXTRACTION W/PHACO Right 09/21/2016   Procedure: CATARACT EXTRACTION PHACO AND INTRAOCULAR LENS PLACEMENT (IOC);  Surgeon: Galen Manila, MD;  Location: [facility] ORS;  Service: Ophthalmology;  Laterality: Right;  [place] 44.1 AP% 16.5 CDE 7.30 Fluid Pack Lot #[id number]   CATARACT EXTRACTION W/PHACO Left 10/19/2016    Procedure: CATARACT EXTRACTION PHACO AND INTRAOCULAR LENS PLACEMENT (IOC);  Surgeon: Galen Manila, MD;  Location: [facility] OR[facility];  Service: Ophthalmology;  Laterality: Left;  [place] 53.7 AP% 19.5 CDE 10.45 Fluid pack lot # [id number]   CHOLECYSTECTOMY     COMBINED AUGMENTATION MAMMAPLASTY AND ABDOMINOPLASTY     JOINT REPLACEMENT Left 06/04/2013   left knee   KNEE ARTHROSCOPY Right 08/16/2016   Procedure: ARTHROSCOPY KNEE, tear posterior horn medial meniscus, tear anterior and posterior horns of lateral meniscus, chondromalacia of lateral compartment grade 3 patella and grade 4 medial;  Surgeon: Donato Heinz, MD;  Location: [facility];  Service: Orthopedics;  Laterality: Right;   LEFT HEART CATH AND CORONARY ANGIOGRAPHY Left 07/27/2019   Procedure: LEFT HEART CATH AND CORONARY ANGIOGRAPHY;  Surgeon: Iran Ouch, MD;  Location: [facility];  Service: Cardiovascular;  Laterality: Left;   MASTECTOMY  1986   nipple sparing mastectomy/Bilateral with silicone  breast implants, s/p saline replacements   Multiple orthopedic procedures     NOSE SURGERY     PACEMAKER IMPLANT N/A 04/13/2023   Procedure: PACEMAKER IMPLANT;  Surgeon: Duke Salvia, MD;  Location: [facility] CV [facility];  Service: Cardiovascular;  Laterality: N/A;   TEE WITHOUT CARDIOVERSION N/A 09/26/2017   Procedure: TRANSESOPHAGEAL ECHOCARDIOGRAM (TEE);  Surgeon: Pricilla Riffle, MD;  Location: [facility];  Service: Cardiovascular;  Laterality: N/A;   TOTAL KNEE ARTHROPLASTY Left     Current Meds  Medication Sig   acetaminophen (TYLENOL) 500 MG tablet Take 1,000 mg by mouth every 6 (six) hours as needed (pain).    aluminum-magnesium hydroxide 200-200 MG/5ML suspension Take 20 mLs by mouth every 6 (six) hours as needed for indigestion.    amLODipine (NORVASC) 2.5 MG tablet Take 2.5 mg by mouth daily as needed.   Carboxymethylcellul-Glycerin (LUBRICATING EYE DROPS OP) Place 1 drop into both eyes daily as needed (dry eyes).    cholecalciferol (VITAMIN D) 1000 units tablet Take 1,000 Units by mouth daily.   cyanocobalamin (VITAMIN B12) 500 MCG tablet Take 500 mcg by mouth daily.   ELIQUIS 5 MG TABS tablet TAKE ONE TABLET TWICE DAILY   metoprolol succinate (TOPROL XL) 25 MG 24 hr tablet Take 1 tablet (25 mg total) by mouth in the morning and at bedtime. (Patient taking differently: Take 50 mg by mouth in the morning and at bedtime.)   pravastatin (PRAVACHOL) 20 MG tablet TAKE 1 TABLET BY MOUTH DAILY   sodium chloride (OCEAN) 0.65 % SOLN nasal spray Place 1 spray into both nostrils daily as needed for congestion.   [DISCONTINUED] ALPRAZolam (XANAX) 0.25 MG tablet Take 1 tablet (0.25 mg total) by mouth 2 (  two) times daily as needed for anxiety.    Allergies  Allergen Reactions   Cyclobenzaprine Other (See Comments)    Dropped blood pressure and heart rate, "talking out of my mind"   Iodine Anaphylaxis    NO PROBLEMS WITH BETADINE   Amiodarone     Cannot remember    Biaxin [Clarithromycin] Nausea And Vomiting   Codeine Nausea And Vomiting   Digoxin And Related Other (See Comments)    Fatigue, eye puffiness, hoarsness   Diltiazem Other (See Comments)   Enalapril Other (See Comments)    unknown   Famotidine     Caused dizziness   Fluocinonide Other (See Comments)    Tingling sensation in head and redness to scalp.   Iodinated Contrast Media Other (See Comments)    Tachycardia   Iodine-131    Losartan Potassium Other (See Comments)    Dizzy and headache   Omnicef [Cefdinir]    Oxycodone Nausea And Vomiting   Promethazine Other (See Comments)    Unknown    Sertraline     Dizziness and weakness   Tizanidine Hcl Other (See Comments)    hypotension    Warfarin And Related     Bleeding    Xarelto [Rivaroxaban] Other (See Comments)    bleeding      Review of Systems negative except from HPI and PMH  Physical Exam BP 138/70 (BP Location: Left Arm)   Pulse 99   Ht 5\' 3"  (1.6 m)   Wt 136 lb (61.7  kg)   SpO2 95%   BMI 24.09 kg/m  Well developed and well nourished in no acute distress HENT normal Neck supple with JVP-8 Clear  Irregularly Irregular rate and rhythm with fast ventricular response , no  gallop No  murmur Abd-soft with active BS No Clubbing cyanosis 1-2+ edema Skin-warm and dry A & Oriented  Grossly normal sensory and motor function  ECG atrial flutter @ 99 -/13/46        CrCl cannot be calculated (Patient's most recent lab result is older than the maximum 21 days allowed.).   Assessment and  Plan AFlutter  tachy brady  permanent    Syncope      Dementia   Pacemaker Abbott   Cardiomyopathy, tachymediated  intercurrent resolution  Hypertension  Anemia   Blood pressure is reasonably controlle will continue her on metoprolol.  Heart rates are fast.  Reviewing electrocardiograms identified that she used to be in atrial fibrillation and now in atrial flutter and I wonder whether that is a responsible with the Norco here and did not regular bombardment of the AV node for more rapid conduction.  There is little we can do to resolve her atrial flutter.  So AV nodal conduction slowing is our target of therapy.  We will also look first as to whether there are underlying issues to facilitate AV nodal conduction i.e. thyroid or anemia associated with hypersympathetic tone..  In this regard her last hemoglobin assessed 6/24 had noted to decrease from high 12 to the mid 11's We will continue on her beta-blocker and her current dose; we will resume amiodarone.  It is listed as an allergy, however, reviewed the chart back to 2020 it was discontinued by Dr. Marcheta Grammes because of bradycardia in fact and then by Dr. Johney Frame because of inefficacy but there are no specific allergic symptoms in the event that amiodarone as an AV nodal blocking agent is insufficient, we will consider AV junction ablation; the patient and her  husband both prefer a nonprocedural approach.  With her edema  and her borderline weight (2 weeks ago 60.4 kg) we will decrease apixaban from 5--2.5 mg twice daily.  Will plan to see her again in 6 weeks time to assess the effectiveness of her rate controlling strategy  She is volume overloaded.  Despite the paucity of symptoms, she has now edema and JVP neither which were noted on Dr. Dianne Dun note from 01/24/2024.  We will put her on low-dose furosemide at 20 mg every other day x 4 doses

## 2024-02-09 NOTE — Telephone Encounter (Signed)
Called pharmacy.  Advised that per Dr.Klein okay to prescribe- removed amiodarone from her allergy list per request.   Thanks!

## 2024-02-09 NOTE — Telephone Encounter (Unsigned)
 This encounter was created in error - please disregard.

## 2024-02-09 NOTE — Patient Instructions (Signed)
Medication Instructions:  RESTART Amiodarone 200 mg twice daily for 4 weeks, then decrease to 200 mg once daily   In 3 weeks, decrease Metoprolol to 50 mg daily   Decrease Eliquis to 2.5 mg twice daily   Take Furosemide 20 mg every other day for 4 doses.   *If you need a refill on your cardiac medications before your next appointment, please call your pharmacy*   Lab Work: Your provider would like for you to have following labs drawn today TSH, CBC, CMET.   If you have labs (blood work) drawn today and your tests are completely normal, you will receive your results only by: MyChart Message (if you have MyChart) OR A paper copy in the mail If you have any lab test that is abnormal or we need to change your treatment, we will call you to review the results.   Follow-Up: At [facility], you and your health needs are our priority.  As part of our continuing mission to provide you with exceptional heart care, we have created designated Provider Care Teams.  These Care Teams include your primary Cardiologist (physician) and Advanced Practice Providers (APPs -  Physician Assistants and Nurse Practitioners) who all work together to provide you with the care you need, when you need it.  We recommend signing up for the patient portal called "MyChart".  Sign up information is provided on this After Visit Summary.  MyChart is used to connect with patients for Virtual Visits (Telemedicine).  Patients are able to view lab/test results, encounter notes, upcoming appointments, etc.  Non-urgent messages can be sent to your provider as well.   To learn more about what you can do with MyChart, go to [email or web address].    Your next appointment:   6 week(s)  Provider:   Sherie Don, NP

## 2024-02-09 NOTE — Telephone Encounter (Signed)
Pt c/o medication issue:  1. Name of Medication: amiodarone (PACERONE) 200 MG tablet   2. How are you currently taking this medication (dosage and times per day)?   3. Are you having a reaction (difficulty breathing--STAT)?   4. What is your medication issue? Possible allergy per pharmacy, requesting cb

## 2024-02-10 LAB — COMPREHENSIVE METABOLIC PANEL
ALT: 27 [IU]/L (ref 0–32)
AST: 32 [IU]/L (ref 0–40)
Albumin: 4.3 g/dL (ref 3.7–4.7)
Alkaline Phosphatase: 109 [IU]/L (ref 44–121)
BUN/Creatinine Ratio: 15 (ref 12–28)
BUN: 12 mg/dL (ref 8–27)
Bilirubin Total: 1 mg/dL (ref 0.0–1.2)
CO2: 22 mmol/L (ref 20–29)
Calcium: 9.8 mg/dL (ref 8.7–10.3)
Chloride: 100 mmol/L (ref 96–106)
Creatinine, Ser: 0.78 mg/dL (ref 0.57–1.00)
Globulin, Total: 2.6 g/dL (ref 1.5–4.5)
Glucose: 92 mg/dL (ref 70–99)
Potassium: 5 mmol/L (ref 3.5–5.2)
Sodium: 135 mmol/L (ref 134–144)
Total Protein: 6.9 g/dL (ref 6.0–8.5)
eGFR: 74 mL/min/{1.73_m2} (ref >59)

## 2024-02-10 LAB — CBC
Hematocrit: 38.9 % (ref 34.0–46.6)
Hemoglobin: 12.8 g/dL (ref 11.1–15.9)
MCH: 32.2 pg (ref 26.6–33.0)
MCHC: 32.9 g/dL (ref 31.5–35.7)
MCV: 98 fL — ABNORMAL HIGH (ref 79–97)
Platelets: 285 10*3/uL (ref 150–450)
RBC: 3.97 x10E6/uL (ref 3.77–5.28)
RDW: 12.1 % (ref 11.7–15.4)
WBC: 5.8 10*3/uL (ref 3.4–10.8)

## 2024-02-10 LAB — TSH: TSH: 1 u[IU]/mL (ref 0.450–4.500)

## 2024-02-14 ENCOUNTER — Ambulatory Visit: Payer: Medicare Other | Admitting: Podiatry

## 2024-02-14 ENCOUNTER — Encounter: Payer: Self-pay | Admitting: Podiatry

## 2024-02-14 DIAGNOSIS — E119 Type 2 diabetes mellitus without complications: Secondary | ICD-10-CM | POA: Diagnosis not present

## 2024-02-14 DIAGNOSIS — D2372 Other benign neoplasm of skin of left lower limb, including hip: Secondary | ICD-10-CM | POA: Diagnosis not present

## 2024-02-14 DIAGNOSIS — M79674 Pain in right toe(s): Secondary | ICD-10-CM

## 2024-02-14 DIAGNOSIS — M79675 Pain in left toe(s): Secondary | ICD-10-CM

## 2024-02-14 DIAGNOSIS — B351 Tinea unguium: Secondary | ICD-10-CM

## 2024-02-14 NOTE — Progress Notes (Signed)
 Chief Complaint  Patient presents with   Nail Problem    "Cut my toenails."    SUBJECTIVE Patient presents to office today complaining of elongated, thickened nails that cause pain while ambulating in shoes.  Patient is unable to trim their own nails. Patient is here for further evaluation and treatment.  Past Medical History:  Diagnosis Date   Actinic keratosis    Arthritis    Atrial flutter (HCC) 02/2011   s/p cardioversion    Chest pain    a. H/o cardiac cath x 2-> 2012 -->nl cors;  b. 12/2016 MV: EF 61%, small region of mild perfusion defect in the apical anteroseptal region c/w breast attenuation, no ischemia-->Low risk; c. 06/2019 MV: Mod size, mild inflat ischemia, EF 59%. Cor and Ao Ca2+. Inflat defect more pronounced on this study compared to last; c. 07/2019 Cath: LM nl, LAD min irregs, LCX nl, OM1/2/3 nl, RCA min irregs.   CKD (chronic kidney disease), stage III (HCC)    Concussion with no loss of consciousness 09/01/2016   Cystocele    Degenerative disorder of bone    GERD (gastroesophageal reflux disease)    Headache(784.0)    chronic   Hiatal hernia    Hyperlipidemia    Hypertension    Influenza 01/10/2017   Knee fracture    Migraines    Mobitz type 2 second degree atrioventricular block    a. felt to be 2/2 amiodarone, resolved with decreased amiodarone dose.  Amio since d/c'd.   Permanent atrial fibrillation (HCC)    a. status post multiple DCCVs; b. 2018 - eval for PVI but opted for rate control.   PONV (postoperative nausea and vomiting)    oxycodone and codiene cause N/V    Pre-syncope    a. In setting of dehydration and AKI in the past.   Sleep apnea    Squamous cell carcinoma of skin 11/10/2020   left distal posterior deltoid (EDC 01/15/2021)   Tachycardia induced cardiomyopathy (HCC)    a. Resolved;  b. 08/2017 Echo: EF 50-55%, no rwma, mild MR, mildly to mod dil LA/RA; c. 02/2018 Echo: EF 55-60%, mild MR. Mildly dil LA. Nl RVSP. PASP .   Venous  insufficiency    Vertigo     Allergies  Allergen Reactions   Cyclobenzaprine Other (See Comments)    Dropped blood pressure and heart rate, "talking out of my mind"   Iodine Anaphylaxis    NO PROBLEMS WITH BETADINE   Biaxin [Clarithromycin] Nausea And Vomiting   Codeine Nausea And Vomiting   Digoxin And Related Other (See Comments)    Fatigue, eye puffiness, hoarsness   Diltiazem Other (See Comments)   Enalapril Other (See Comments)    unknown   Famotidine     Caused dizziness   Fluocinonide Other (See Comments)    Tingling sensation in head and redness to scalp.   Iodinated Contrast Media Other (See Comments)    Tachycardia   Iodine-131    Losartan Potassium Other (See Comments)    Dizzy and headache   Omnicef [Cefdinir]    Oxycodone Nausea And Vomiting   Promethazine Other (See Comments)    Unknown    Sertraline     Dizziness and weakness   Tizanidine Hcl Other (See Comments)    hypotension    Warfarin And Related     Bleeding    Xarelto [Rivaroxaban] Other (See Comments)    bleeding     OBJECTIVE General Patient is awake, alert, and oriented  x 3 and in no acute distress. Derm Skin is dry and supple bilateral. Negative open lesions or macerations. Remaining integument unremarkable. Nails are tender, long, thickened and dystrophic with subungual debris, consistent with onychomycosis, 1-5 bilateral. No signs of infection noted.  Hyperkeratotic skin lesion noted to the plantar aspect of the left forefoot with a central nucleated core Vasc  DP and PT pedal pulses palpable bilaterally. Temperature gradient within normal limits.  Neuro grossly and tact via light touch Musculoskeletal Exam No symptomatic pedal deformities noted bilateral. Muscular strength within normal limits.  ASSESSMENT 1.  Pain due to onychomycosis of toenails both 2.  Eccrine poroma left foot 3.  Encounter for diabetic foot exam  PLAN OF CARE 1. Patient evaluated today.  Comprehensive diabetic  foot exam performed today 2. Instructed to maintain good pedal hygiene and foot care.  3. Mechanical debridement of nails 1-5 bilaterally performed using a nail nipper. Filed with dremel without incident.  4.  Excisional debridement of the eccrine poroma was performed today using a 312 scalpel and tissue nipper.  Salicylic acid and a Band-Aid applied.  Patient felt significant relief  5.  Return to clinic in 3 mos.    Felecia Shelling, DPM [facility]  Dr. Felecia Shelling, DPM    [address] [place], [place] [address]                Office [phone]  Fax [phone]

## 2024-02-22 NOTE — Progress Notes (Signed)
 Remote pacemaker transmission.

## 2024-02-29 ENCOUNTER — Encounter: Payer: Self-pay | Admitting: Internal Medicine

## 2024-02-29 ENCOUNTER — Ambulatory Visit (INDEPENDENT_AMBULATORY_CARE_PROVIDER_SITE_OTHER): Admitting: Internal Medicine

## 2024-02-29 VITALS — BP 122/70 | HR 79 | Ht 63.0 in | Wt 137.6 lb

## 2024-02-29 DIAGNOSIS — S7002XA Contusion of left hip, initial encounter: Secondary | ICD-10-CM | POA: Diagnosis not present

## 2024-02-29 DIAGNOSIS — D62 Acute posthemorrhagic anemia: Secondary | ICD-10-CM

## 2024-02-29 DIAGNOSIS — S7012XA Contusion of left thigh, initial encounter: Secondary | ICD-10-CM | POA: Diagnosis not present

## 2024-02-29 DIAGNOSIS — E7800 Pure hypercholesterolemia, unspecified: Secondary | ICD-10-CM

## 2024-02-29 DIAGNOSIS — Z9181 History of falling: Secondary | ICD-10-CM

## 2024-02-29 DIAGNOSIS — R7303 Prediabetes: Secondary | ICD-10-CM

## 2024-02-29 DIAGNOSIS — Z7901 Long term (current) use of anticoagulants: Secondary | ICD-10-CM

## 2024-02-29 DIAGNOSIS — R2689 Other abnormalities of gait and mobility: Secondary | ICD-10-CM

## 2024-02-29 DIAGNOSIS — E1149 Type 2 diabetes mellitus with other diabetic neurological complication: Secondary | ICD-10-CM

## 2024-02-29 NOTE — Patient Instructions (Addendum)
 Suspend the Eliquis for 2 days so the bleeding will stop and the hematoma will not get larger   Use ice packs on the hematoma  to help it shrinks     You can add up to 3000 mg of acetaminophen (tylenol) every day safely  In divided doses (1000 mg every 8 hours )   I am recommending Home health PT to assess your balance and strength

## 2024-02-29 NOTE — Progress Notes (Unsigned)
 Subjective:  Patient ID: [name] [name], female    DOB: 16-Sep-1936  Age: 88 y.o. MRN: [id number]  CC: The primary encounter diagnosis was Prediabetes. Diagnoses of Pure hypercholesterolemia, Acute blood loss anemia, Loss of balance, History of recent fall, Hematoma of left hip, initial encounter, Hematoma of left thigh, initial encounter, Long term current use of anticoagulant therapy, and Type 2 diabetes mellitus with neurological complications (HCC) were also pertinent to this visit.   HPI [name] presents for an acute visit  Chief Complaint  Patient presents with   Fall    2 or 3 days ago pt fell in the garage carrying groceries inside. Pt stated that she went to get the groceries out of the trunk and she fell backwards. Pt's husband tried to catch but was unable to. Pt stated that her left shoulder and arm are sore. Her left hip is bruised and pt's husband stated that she has been complaining of a headache. Pt stated that she did hit her head but did not "crack it on the ground".   Patient presents to today  for evaluation of traumatic injury.  She is accompanied by her husband [name], who provides most of the history.  She reportedly fell backward onto a cement driveway ,while leaning into the trunk of her car to retrieve groceries. She no longer drives due to her MCI.  She  was unable to get up unassisted.  No LOC .  Left hip became quite swollen and painful and bruised.  She did not seek medical attention until today  . Patient takes Eliquis for atrial fibrillation .  She denies headache, vertigo, and changes in vision  Has been taking  tylenol every  6 hors for the hip pain    Outpatient Medications Prior to Visit  Medication Sig Dispense Refill   acetaminophen (TYLENOL) 500 MG tablet Take 1,000 mg by mouth every 6 (six) hours as needed (pain).      aluminum-magnesium hydroxide 200-200 MG/5ML suspension Take 20 mLs by mouth every 6 (six) hours as needed for indigestion.       amiodarone (PACERONE) 200 MG tablet Take 1 tablet (200 mg) by mouth twice daily for 4 weeks, then decrease to 1 tablet (200 mg) by mouth daily 180 tablet 2   amLODipine (NORVASC) 2.5 MG tablet Take 2.5 mg by mouth daily as needed.     apixaban (ELIQUIS) 2.5 MG TABS tablet Take 1 tablet (2.5 mg total) by mouth 2 (two) times daily. 180 tablet 1   Carboxymethylcellul-Glycerin (LUBRICATING EYE DROPS OP) Place 1 drop into both eyes daily as needed (dry eyes).     cholecalciferol (VITAMIN D) 1000 units tablet Take 1,000 Units by mouth daily.     cyanocobalamin (VITAMIN B12) 500 MCG tablet Take 500 mcg by mouth daily.     furosemide (LASIX) 20 MG tablet Take 1 tablet by mouth every other day for 4 doses 10 tablet 0   metoprolol succinate (TOPROL XL) 25 MG 24 hr tablet Take 1 tablet (25 mg total) by mouth daily. (Patient taking differently: Take 50 mg by mouth daily.) 90 tablet 1   pravastatin (PRAVACHOL) 20 MG tablet TAKE 1 TABLET BY MOUTH DAILY 90 tablet 2   sodium chloride (OCEAN) 0.65 % SOLN nasal spray Place 1 spray into both nostrils daily as needed for congestion.     No facility-administered medications prior to visit.    Review of Systems;  Patient denies headache, fevers, malaise, unintentional weight loss, skin  rash, eye pain, sinus congestion and sinus pain, sore throat, dysphagia,  hemoptysis , cough, dyspnea, wheezing, chest pain, palpitations, orthopnea, edema, abdominal pain, nausea, melena, diarrhea, constipation, flank pain, dysuria, hematuria, urinary  Frequency, nocturia, numbness, tingling, seizures,  Focal weakness, Loss of consciousness,  Tremor, insomnia, depression, anxiety, and suicidal ideation.      Objective:  BP 122/70   Pulse 79   Ht 5\' 3"  (1.6 m)   Wt 137 lb 9.6 oz (62.4 kg)   SpO2 97%   BMI 24.37 kg/m   BP Readings from Last 3 Encounters:  02/29/24 122/70  02/09/24 138/70  01/25/24 121/79    Wt Readings from Last 3 Encounters:  02/29/24 137 lb 9.6 oz (62.4  kg)  02/09/24 136 lb (61.7 kg)  01/25/24 133 lb 9.6 oz (60.6 kg)    Physical Exam Vitals reviewed.  Constitutional:      General: She is not in acute distress.    Appearance: Normal appearance. She is normal weight. She is not ill-appearing, toxic-appearing or diaphoretic.  HENT:     Head: Normocephalic.  Eyes:     General: No scleral icterus.       Right eye: No discharge.        Left eye: No discharge.     Conjunctiva/sclera: Conjunctivae normal.  Musculoskeletal:        General: Swelling present. Normal range of motion.       Legs:     Comments: Large hematome over left greater trochanter with excessive bruising on the lateral surface of left thigh .  No bruising of inguinal or medial side   Skin:    General: Skin is warm and dry.  Neurological:     General: No focal deficit present.     Mental Status: She is alert and oriented to person, place, and time. Mental status is at baseline.  Psychiatric:        Mood and Affect: Mood normal.        Behavior: Behavior normal.        Thought Content: Thought content normal.        Judgment: Judgment normal.    Lab Results  Component Value Date   HGBA1C 5.9 02/29/2024   HGBA1C 6.5 12/29/2022   HGBA1C 6.5 05/26/2022    Lab Results  Component Value Date   CREATININE 0.83 02/29/2024   CREATININE 0.78 02/09/2024   CREATININE 1.03 (H) 09/22/2023    Lab Results  Component Value Date   WBC 6.1 02/29/2024   HGB 12.9 02/29/2024   HCT 39.1 02/29/2024   PLT 283.0 02/29/2024   GLUCOSE 92 02/29/2024   CHOL 118 02/29/2024   TRIG 43.0 02/29/2024   HDL 64.10 02/29/2024   LDLDIRECT 43.0 02/29/2024   LDLCALC 45 02/29/2024   ALT 22 02/29/2024   AST 27 02/29/2024   NA 135 02/29/2024   K 4.5 02/29/2024   CL 101 02/29/2024   CREATININE 0.83 02/29/2024   BUN 17 02/29/2024   CO2 27 02/29/2024   TSH 1.000 02/09/2024   INR 1.3 02/12/2019   HGBA1C 5.9 02/29/2024   MICROALBUR <0.7 04/29/2023    US BREAST ASPIRATION  LEFT Addendum Date: 08/10/2023 ADDENDUM REPORT: 08/10/2023 15:08 ASPIRATION: FINE NEEDLE ASPIRATION, LEFT BREAST: ASPIRATION: FINE NEEDLE ASPIRATION, LEFT BREAST A. Fine Needle Aspiration, Left breast peri-implant fluid collection. - NON-DIAGNOSTIC. Diagnosis Note: The specimen consists of predominantly blood. A few degenerated cells are present. Rare atypical cells are also present, however the significance of these is unclear  as their presence may be secondary to degenerative changes. If clinical concern persists, recommend biopsy/excision of the area of interest. Aspiration results are concordant with findings per Dr. Meda Klinefelter. Randa Lynn RN spoke with patient via telephone to assess aspiration site, which was found to be within normal limits on 08/09/2023. Questions were answered and patient was instructed to call [facility] for any additional questions or concerns related to the aspiration site. RECOMMENDATIONS: RECOMMENDATIONS 1. Recommend bilateral breast MRI with and without contrast with silicone specific sequences, given diagnostic note of "rare atypical cells" above. 2. With benign/reassuring MRI results, a follow up mammogram and ultrasound in six months is recommended. Pathology results reported by Randa Lynn RN on 08/10/2023. Electronically Signed   By: Meda Klinefelter M.D.   On: 08/10/2023 15:08   Result Date: 08/10/2023 CLINICAL DATA:  Patient with LEFT breast Periimplant fluid collection and longstanding history of silicone implant. Patient is on Eliquis. EXAM: ULTRASOUND GUIDED LEFT BREAST CYST ASPIRATION COMPARISON:  Previous exam(s). PROCEDURE: The patient, patient's husband and I discussed the procedure of ultrasound-guided aspiration including benefits and alternatives. We discussed the high likelihood of a successful procedure. We discussed the risks of the procedure including infection, bleeding, tissue injury, and inadequate sampling. Informed written consent was  given. The usual time out protocol was performed immediately prior to the procedure. Using sterile technique, 1% lidocaine, under direct ultrasound visualization, needle aspiration of a periimplant fluid collection was performed. Approximately 4 ML of bloody fluid was aspirated. Aspirated fluid was sent to the lab for cytology. IMPRESSION: Ultrasound-guided aspiration of a bloody appearing LEFT breast periimplant fluid collection. No apparent complications. RECOMMENDATIONS: Pending laboratory results. There is favored at least intracapsular rupture. Given bilateral palpable areas of concern, this could be further assessed with dedicated breast MRI with and without contrast with silicone specific sequences if clinically indicated in further imaging is desired. Electronically Signed: By: Meda Klinefelter M.D. On: 08/08/2023 09:46    Assessment & Plan:  .Prediabetes -     Comprehensive metabolic panel -     Hemoglobin A1c  Pure hypercholesterolemia -     Lipid panel -     LDL cholesterol, direct  Acute blood loss anemia -     CBC with Differential/Platelet -     Iron, TIBC and Ferritin Panel  Loss of balance -     B12 and Folate Panel -     Ambulatory referral to Home Health  History of recent fall Assessment & Plan: Neither she nor husband appear to appreciate the gravity of a fall while anticoagulated . She has been strongly encouraged to seek medical attention immediately via the ER if she has another fall.  Home Health PT ordered   Orders: -     Ambulatory referral to Home Health  Hematoma of left hip, initial encounter -     Ambulatory referral to Home Health  Hematoma of left thigh, initial encounter Assessment & Plan: Patient does not appear to have a fracture based on exam and ROM but has a large hematoma due to continued use of DOAC.  Advised to susend DOAC for 2 days and use ice packs to hematoma..     Long term current use of anticoagulant therapy Assessment &  Plan: Advised to suspend DOoa FOR TWO DAYS TO ALLOW HEMATOMA TO STOP ENLARGING    Type 2 diabetes mellitus with neurological complications (HCC) Assessment & Plan: A1c is at goal without medications  Lab Results  Component Value Date  HGBA1C 5.9 02/29/2024   Lab Results  Component Value Date   MICROALBUR <0.7 04/29/2023   MICROALBUR 8.3 05/25/2017         Follow-up: No follow-ups on file.   Sherlene Shams, MD

## 2024-03-01 DIAGNOSIS — S7012XA Contusion of left thigh, initial encounter: Secondary | ICD-10-CM | POA: Insufficient documentation

## 2024-03-01 DIAGNOSIS — Z9181 History of falling: Secondary | ICD-10-CM | POA: Insufficient documentation

## 2024-03-01 DIAGNOSIS — E1149 Type 2 diabetes mellitus with other diabetic neurological complication: Secondary | ICD-10-CM | POA: Insufficient documentation

## 2024-03-01 LAB — CBC WITH DIFFERENTIAL/PLATELET
Basophils Absolute: 0.1 10*3/uL (ref 0.0–0.1)
Basophils Relative: 0.9 % (ref 0.0–3.0)
Eosinophils Absolute: 0.1 10*3/uL (ref 0.0–0.7)
Eosinophils Relative: 0.9 % (ref 0.0–5.0)
HCT: 39.1 % (ref 36.0–46.0)
Hemoglobin: 12.9 g/dL (ref 12.0–15.0)
Lymphocytes Relative: 10.5 % — ABNORMAL LOW (ref 12.0–46.0)
Lymphs Abs: 0.6 10*3/uL — ABNORMAL LOW (ref 0.7–4.0)
MCHC: 33 g/dL (ref 30.0–36.0)
MCV: 98.9 fl (ref 78.0–100.0)
Monocytes Absolute: 0.3 10*3/uL (ref 0.1–1.0)
Monocytes Relative: 5.4 % (ref 3.0–12.0)
Neutro Abs: 5 10*3/uL (ref 1.4–7.7)
Neutrophils Relative %: 82.3 % — ABNORMAL HIGH (ref 43.0–77.0)
Platelets: 283 10*3/uL (ref 150.0–400.0)
RBC: 3.96 Mil/uL (ref 3.87–5.11)
RDW: 14.3 % (ref 11.5–15.5)
WBC: 6.1 10*3/uL (ref 4.0–10.5)

## 2024-03-01 LAB — COMPREHENSIVE METABOLIC PANEL
ALT: 22 U/L (ref 0–35)
AST: 27 U/L (ref 0–37)
Albumin: 4.2 g/dL (ref 3.5–5.2)
Alkaline Phosphatase: 75 U/L (ref 39–117)
BUN: 17 mg/dL (ref 6–23)
CO2: 27 meq/L (ref 19–32)
Calcium: 9.6 mg/dL (ref 8.4–10.5)
Chloride: 101 meq/L (ref 96–112)
Creatinine, Ser: 0.83 mg/dL (ref 0.40–1.20)
GFR: 63.97 mL/min (ref >60.00)
Glucose, Bld: 92 mg/dL (ref 70–99)
Potassium: 4.5 meq/L (ref 3.5–5.1)
Sodium: 135 meq/L (ref 135–145)
Total Bilirubin: 1.1 mg/dL (ref 0.2–1.2)
Total Protein: 7.1 g/dL (ref 6.0–8.3)

## 2024-03-01 LAB — IRON,TIBC AND FERRITIN PANEL
%SAT: 17 % (ref 16–45)
Ferritin: 41 ng/mL (ref 16–288)
Iron: 72 ug/dL (ref 45–160)
TIBC: 425 ug/dL (ref 250–450)

## 2024-03-01 LAB — LIPID PANEL
Cholesterol: 118 mg/dL (ref 0–200)
HDL: 64.1 mg/dL (ref >39.00)
LDL Cholesterol: 45 mg/dL (ref 0–99)
NonHDL: 54.05
Total CHOL/HDL Ratio: 2
Triglycerides: 43 mg/dL (ref 0.0–149.0)
VLDL: 8.6 mg/dL (ref 0.0–40.0)

## 2024-03-01 LAB — B12 AND FOLATE PANEL
Folate: 22.8 ng/mL (ref >5.9)
Vitamin B-12: 718 pg/mL (ref 211–911)

## 2024-03-01 LAB — LDL CHOLESTEROL, DIRECT: Direct LDL: 43 mg/dL

## 2024-03-01 LAB — HEMOGLOBIN A1C: Hgb A1c MFr Bld: 5.9 % (ref 4.6–6.5)

## 2024-03-01 NOTE — Assessment & Plan Note (Signed)
 A1c is at goal without medications  Lab Results  Component Value Date   HGBA1C 5.9 02/29/2024   Lab Results  Component Value Date   MICROALBUR <0.7 04/29/2023   MICROALBUR 8.3 05/25/2017

## 2024-03-01 NOTE — Assessment & Plan Note (Signed)
 Neither she nor husband appear to appreciate the gravity of a fall while anticoagulated . She has been strongly encouraged to seek medical attention immediately via the ER if she has another fall.  Home Health PT ordered

## 2024-03-01 NOTE — Assessment & Plan Note (Signed)
 Patient does not appear to have a fracture based on exam and ROM but has a large hematoma due to continued use of DOAC.  Advised to susend DOAC for 2 days and use ice packs to hematoma.[name]

## 2024-03-01 NOTE — Assessment & Plan Note (Signed)
 Advised to suspend DOoa FOR TWO DAYS TO ALLOW HEMATOMA TO STOP ENLARGING

## 2024-03-03 DIAGNOSIS — W19XXXD Unspecified fall, subsequent encounter: Secondary | ICD-10-CM | POA: Diagnosis not present

## 2024-03-03 DIAGNOSIS — Z9181 History of falling: Secondary | ICD-10-CM | POA: Diagnosis not present

## 2024-03-03 DIAGNOSIS — D62 Acute posthemorrhagic anemia: Secondary | ICD-10-CM | POA: Diagnosis not present

## 2024-03-03 DIAGNOSIS — Z7901 Long term (current) use of anticoagulants: Secondary | ICD-10-CM | POA: Diagnosis not present

## 2024-03-03 DIAGNOSIS — E1149 Type 2 diabetes mellitus with other diabetic neurological complication: Secondary | ICD-10-CM | POA: Diagnosis not present

## 2024-03-03 DIAGNOSIS — S7002XD Contusion of left hip, subsequent encounter: Secondary | ICD-10-CM | POA: Diagnosis not present

## 2024-03-03 DIAGNOSIS — E7800 Pure hypercholesterolemia, unspecified: Secondary | ICD-10-CM | POA: Diagnosis not present

## 2024-03-03 DIAGNOSIS — S7012XD Contusion of left thigh, subsequent encounter: Secondary | ICD-10-CM | POA: Diagnosis not present

## 2024-03-06 ENCOUNTER — Telehealth: Payer: Self-pay

## 2024-03-06 ENCOUNTER — Encounter: Payer: Medicare Other | Admitting: Cardiology

## 2024-03-06 NOTE — Telephone Encounter (Signed)
 Patient's husband calls concerned that her heart rates are now in the 60's after recent start on Amiodarone.   I reassured him that this is good news and means the medications are working as her prior average heart rates were frequently above 120's. Also, reminded him that patient has a PPM that will not allow her heart rate to get too low.    Device interrogation today revealed:  Presenting VP 78bpm HG's and cardiac compass show improvement in heart rate control Patient is now VP at a higher rate due to slowing of the AV conduction.    Need to clarify how patient should be taking the Metoprolol: AVS says change to 1 (25mg ) tablet daily. Dr. Koren Bound note says 50mg  daily - patient is taking 1/2 (25mg  q am) and 1/2 (25mg  q pm).   Patient did fall 2-3 weeks ago and injured hip but is doing well since.        FEBRUARY HG PRIOR TO MED ADJUSTMENTS: Metoprolol/ Initiation of Amio.

## 2024-03-06 NOTE — Telephone Encounter (Signed)
 Pt husband called in concerned about her heartrate and her medicine. Pt husband states that her heart rate has been high int he 120's and then get low in the 60's and wants to know if they should change medications

## 2024-03-07 DIAGNOSIS — E1149 Type 2 diabetes mellitus with other diabetic neurological complication: Secondary | ICD-10-CM | POA: Diagnosis not present

## 2024-03-07 DIAGNOSIS — S7002XD Contusion of left hip, subsequent encounter: Secondary | ICD-10-CM | POA: Diagnosis not present

## 2024-03-07 DIAGNOSIS — D62 Acute posthemorrhagic anemia: Secondary | ICD-10-CM | POA: Diagnosis not present

## 2024-03-07 DIAGNOSIS — W19XXXD Unspecified fall, subsequent encounter: Secondary | ICD-10-CM | POA: Diagnosis not present

## 2024-03-07 DIAGNOSIS — Z7901 Long term (current) use of anticoagulants: Secondary | ICD-10-CM | POA: Diagnosis not present

## 2024-03-07 DIAGNOSIS — E7800 Pure hypercholesterolemia, unspecified: Secondary | ICD-10-CM | POA: Diagnosis not present

## 2024-03-07 DIAGNOSIS — Z9181 History of falling: Secondary | ICD-10-CM | POA: Diagnosis not present

## 2024-03-07 DIAGNOSIS — S7012XD Contusion of left thigh, subsequent encounter: Secondary | ICD-10-CM | POA: Diagnosis not present

## 2024-03-07 NOTE — Telephone Encounter (Signed)
 Cecille Po figure out what she is taking and we can leave her there now that her HR are controlled Please clarify with her Thanks SK

## 2024-03-08 NOTE — Telephone Encounter (Signed)
 Spoke with patient's husband:  He confirms she is taking the following:  Metoprolol succinate 25mg  1 pill twice daily Amiodarone 200mg  once daily    Will make note on medication list.

## 2024-03-09 DIAGNOSIS — Z7901 Long term (current) use of anticoagulants: Secondary | ICD-10-CM | POA: Diagnosis not present

## 2024-03-09 DIAGNOSIS — S7002XD Contusion of left hip, subsequent encounter: Secondary | ICD-10-CM | POA: Diagnosis not present

## 2024-03-09 DIAGNOSIS — Z9181 History of falling: Secondary | ICD-10-CM | POA: Diagnosis not present

## 2024-03-09 DIAGNOSIS — S7012XD Contusion of left thigh, subsequent encounter: Secondary | ICD-10-CM | POA: Diagnosis not present

## 2024-03-09 DIAGNOSIS — E1149 Type 2 diabetes mellitus with other diabetic neurological complication: Secondary | ICD-10-CM | POA: Diagnosis not present

## 2024-03-09 DIAGNOSIS — W19XXXD Unspecified fall, subsequent encounter: Secondary | ICD-10-CM | POA: Diagnosis not present

## 2024-03-09 DIAGNOSIS — E7800 Pure hypercholesterolemia, unspecified: Secondary | ICD-10-CM | POA: Diagnosis not present

## 2024-03-09 DIAGNOSIS — D62 Acute posthemorrhagic anemia: Secondary | ICD-10-CM | POA: Diagnosis not present

## 2024-03-15 NOTE — Addendum Note (Signed)
 Addended by: Barrington Ellison C on: 03/15/2024 10:15 AM   Modules accepted: Orders

## 2024-03-20 DIAGNOSIS — E871 Hypo-osmolality and hyponatremia: Secondary | ICD-10-CM | POA: Diagnosis not present

## 2024-03-20 DIAGNOSIS — I10 Essential (primary) hypertension: Secondary | ICD-10-CM | POA: Diagnosis not present

## 2024-03-20 DIAGNOSIS — E785 Hyperlipidemia, unspecified: Secondary | ICD-10-CM | POA: Diagnosis not present

## 2024-03-20 DIAGNOSIS — E876 Hypokalemia: Secondary | ICD-10-CM | POA: Diagnosis not present

## 2024-03-20 DIAGNOSIS — E1129 Type 2 diabetes mellitus with other diabetic kidney complication: Secondary | ICD-10-CM | POA: Diagnosis not present

## 2024-03-21 ENCOUNTER — Ambulatory Visit (INDEPENDENT_AMBULATORY_CARE_PROVIDER_SITE_OTHER): Admitting: Internal Medicine

## 2024-03-21 ENCOUNTER — Encounter: Payer: Self-pay | Admitting: Internal Medicine

## 2024-03-21 VITALS — BP 138/74 | HR 59 | Ht 63.0 in | Wt 145.6 lb

## 2024-03-21 DIAGNOSIS — F0280 Dementia in other diseases classified elsewhere without behavioral disturbance: Secondary | ICD-10-CM | POA: Diagnosis not present

## 2024-03-21 DIAGNOSIS — K580 Irritable bowel syndrome with diarrhea: Secondary | ICD-10-CM | POA: Diagnosis not present

## 2024-03-21 DIAGNOSIS — F5101 Primary insomnia: Secondary | ICD-10-CM | POA: Diagnosis not present

## 2024-03-21 NOTE — Progress Notes (Unsigned)
 Subjective:  Patient ID: [name] [name], female    DOB: [birth year]  Age: 88 y.o. MRN: [id number]  CC: There were no encounter diagnoses.   HPI [name] [name] presents for  Chief Complaint  Patient presents with   Medical Management of Chronic Issues    Discuss GI concerns and trouble sleeping. Pt's husband stated that every time she eats she has to go straight to the bathroom. He would like to see about getting a referral to [facility]. He would also like to talk about pt having trouble sleeping at night keeping them both awake most of the night.     88 YR OLD FEMALE WITH TYPE 2 DM , PROGRESSIVE  DEMENTIA   1) "TROUBLE SLEEPING".  PER HUSBAND ,  SHE WAKES HIM UP SEVERAL NIGHTS PER WEEK  FOR URINARY FREQUENCY/OAB,  AND THEN IS DROWSY AND NAPS DURING THE DAY.   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 SHE AVOIDS CERTAIN FOODS BUT DOES NOT HAVE LOOSE STOOLS.     3) SEEN BY [facility] IN [place] FOR  worsening memory issues. SLUMS score is 7/30, indicating severe cognitive impairment. A CT scan in April 2024 was normal. Despite starting donepezil (Aricept) at her last visit, her husband reports no improvement. Discussed that donepezil may slow disease progression but will not restore memory. ADVISED TO - Continue donepezil 10 mg at bedtime  HUSBAND NOT USING  A PILL BOX    Outpatient Medications Prior to Visit  Medication Sig Dispense Refill   acetaminophen (TYLENOL) 500 MG tablet Take 1,000 mg by mouth every 6 (six) hours as needed (pain).      aluminum-magnesium hydroxide 200-200 MG/5ML suspension Take 20 mLs by mouth every 6 (six) hours as needed for indigestion.      amiodarone (PACERONE) 200 MG tablet Take 1 tablet (200 mg) by mouth twice daily for 4 weeks, then decrease to 1 tablet (200 mg) by mouth daily 180 tablet 2   amLODipine (NORVASC) 2.5 MG tablet Take 2.5 mg by mouth daily as needed.     amoxicillin  (AMOXIL) 500 MG capsule Take by mouth.     apixaban (ELIQUIS) 2.5 MG TABS tablet Take 1 tablet (2.5 mg total) by mouth 2 (two) times daily. 180 tablet 1   Carboxymethylcellul-Glycerin (LUBRICATING EYE DROPS OP) Place 1 drop into both eyes daily as needed (dry eyes).     cholecalciferol (VITAMIN D) 1000 units tablet Take 1,000 Units by mouth daily.     cyanocobalamin (VITAMIN B12) 500 MCG tablet Take 500 mcg by mouth daily.     donepezil (ARICEPT ODT) 10 MG disintegrating tablet Take 1 tablet by mouth at bedtime.     furosemide (LASIX) 20 MG tablet Take 1 tablet by mouth every other day for 4 doses 10 tablet 0   metoprolol succinate (TOPROL-XL) 25 MG 24 hr tablet Take 25 mg by mouth 2 (two) times daily.     pravastatin (PRAVACHOL) 20 MG tablet TAKE 1 TABLET BY MOUTH DAILY 90 tablet 2   sodium chloride (OCEAN) 0.65 % SOLN nasal spray Place 1 spray into both nostrils daily as needed for congestion.     No facility-administered medications prior to visit.    Review of Systems;  Patient denies headache, fevers, malaise, unintentional weight loss, skin rash, eye pain, sinus congestion and sinus pain, sore throat, dysphagia,  hemoptysis , cough, dyspnea, wheezing, chest pain, palpitations,  orthopnea, edema, abdominal pain, nausea, melena, diarrhea, constipation, flank pain, dysuria, hematuria, urinary  Frequency, nocturia, numbness, tingling, seizures,  Focal weakness, Loss of consciousness,  Tremor, insomnia, depression, anxiety, and suicidal ideation.      Objective:  BP 138/74   Pulse (!) 59   Ht 5\' 3"  (1.6 m)   Wt 145 lb 9.6 oz (66 kg)   SpO2 96%   BMI 25.79 kg/m   BP Readings from Last 3 Encounters:  03/21/24 138/74  02/29/24 122/70  02/09/24 138/70    Wt Readings from Last 3 Encounters:  03/21/24 145 lb 9.6 oz (66 kg)  02/29/24 137 lb 9.6 oz (62.4 kg)  02/09/24 136 lb (61.7 kg)    Physical Exam  Lab Results  Component Value Date   HGBA1C 5.9 02/29/2024   HGBA1C 6.5  12/29/2022   HGBA1C 6.5 05/26/2022    Lab Results  Component Value Date   CREATININE 0.83 02/29/2024   CREATININE 0.78 02/09/2024   CREATININE 1.03 (H) 09/22/2023    Lab Results  Component Value Date   WBC 6.1 02/29/2024   HGB 12.9 02/29/2024   HCT 39.1 02/29/2024   PLT 283.0 02/29/2024   GLUCOSE 92 02/29/2024   CHOL 118 02/29/2024   TRIG 43.0 02/29/2024   HDL 64.10 02/29/2024   LDLDIRECT 43.0 02/29/2024   LDLCALC 45 02/29/2024   ALT 22 02/29/2024   AST 27 02/29/2024   NA 135 02/29/2024   K 4.5 02/29/2024   CL 101 02/29/2024   CREATININE 0.83 02/29/2024   BUN 17 02/29/2024   CO2 27 02/29/2024   TSH 1.000 02/09/2024   INR 1.3 02/12/2019   HGBA1C 5.9 02/29/2024   MICROALBUR <0.7 04/29/2023    US BREAST ASPIRATION LEFT Addendum Date: 08/10/2023 ADDENDUM REPORT: 08/10/2023 15:08 ASPIRATION: FINE NEEDLE ASPIRATION, LEFT BREAST: ASPIRATION: FINE NEEDLE ASPIRATION, LEFT BREAST A. Fine Needle Aspiration, Left breast peri-implant fluid collection. - NON-DIAGNOSTIC. Diagnosis Note: The specimen consists of predominantly blood. A few degenerated cells are present. Rare atypical cells are also present, however the significance of these is unclear as their presence may be secondary to degenerative changes. If clinical concern persists, recommend biopsy/excision of the area of interest. Aspiration results are concordant with findings per Dr. Meda Klinefelter. Randa Lynn RN spoke with patient via telephone to assess aspiration site, which was found to be within normal limits on 08/09/2023. Questions were answered and patient was instructed to call [facility] for any additional questions or concerns related to the aspiration site. RECOMMENDATIONS: RECOMMENDATIONS 1. Recommend bilateral breast MRI with and without contrast with silicone specific sequences, given diagnostic note of "rare atypical cells" above. 2. With benign/reassuring MRI results, a follow up mammogram and ultrasound  in six months is recommended. Pathology results reported by Randa Lynn RN on 08/10/2023. Electronically Signed   By: Meda Klinefelter M.D.   On: 08/10/2023 15:08   Result Date: 08/10/2023 CLINICAL DATA:  Patient with LEFT breast Periimplant fluid collection and longstanding history of silicone implant. Patient is on Eliquis. EXAM: ULTRASOUND GUIDED LEFT BREAST CYST ASPIRATION COMPARISON:  Previous exam(s). PROCEDURE: The patient, patient's husband and I discussed the procedure of ultrasound-guided aspiration including benefits and alternatives. We discussed the high likelihood of a successful procedure. We discussed the risks of the procedure including infection, bleeding, tissue injury, and inadequate sampling. Informed written consent was given. The usual time out protocol was performed immediately prior to the procedure. Using sterile technique, 1% lidocaine, under direct ultrasound visualization, needle aspiration of  a periimplant fluid collection was performed. Approximately 4 ML of bloody fluid was aspirated. Aspirated fluid was sent to the lab for cytology. IMPRESSION: Ultrasound-guided aspiration of a bloody appearing LEFT breast periimplant fluid collection. No apparent complications. RECOMMENDATIONS: Pending laboratory results. There is favored at least intracapsular rupture. Given bilateral palpable areas of concern, this could be further assessed with dedicated breast MRI with and without contrast with silicone specific sequences if clinically indicated in further imaging is desired. Electronically Signed: By: Meda Klinefelter M.D. On: 08/08/2023 09:46    Assessment & Plan:  .There are no diagnoses linked to this encounter.   I spent 34 minutes on the day of this face to face encounter reviewing patient's  most recent visit with cardiology,  nephrology,  and neurology,  prior relevant surgical and non surgical procedures, recent  labs and imaging studies, counseling on weight management,   reviewing the assessment and plan with patient, and post visit ordering and reviewing of  diagnostics and therapeutics with patient  .   Follow-up: No follow-ups on file.   Sherlene Shams, MD

## 2024-03-21 NOTE — Patient Instructions (Signed)
 Elynore does NOT need to see a Solicitor unless she is having FREQUENT DIARRHEA   Asencion does NOT need a  sedating medication at night because she is not agitated or hallucinating, and medications to help her sleep will cause her to sleep through the urge to urinate   Cerena's dementia is progressing IN SPITE OF ARICEPT.  Return to Dr Sherryll Burger for additional medication trials

## 2024-03-21 NOTE — Progress Notes (Unsigned)
 Electrophysiology Clinic Note    Date:  03/22/2024  Patient ID:  [name] [name], [name] [name] 07/04/1936, MRN [id number] PCP:  Sherlene Shams, MD  Cardiologist:  Lorine Bears, MD Electrophysiologist: Sherryl Manges, MD   Discussed the use of AI scribe software for clinical note transcription with the patient, who gave verbal consent to proceed.   Patient Profile    Chief Complaint: amio follow-up  History of Present Illness: [name] [name] is a 88 y.o. female with PMH notable for perm afib/aflutter, HTN, tachy-brady s/p PPM, cardiomyopathy (now resolved), severe cognitive impairment; seen today for Sherryl Manges, MD for routine EP follow-up.   She was admitted to [facility] hospital 03/2023 after syncopal episode. Several sinus pauses noted, and so PPM was implanted. She last saw Dr. Graciela Husbands 06/2023 for routine 91d follow-up appt. EKG with underlying competitive VP, so lower rate limit increased to 60bpm. She was having episodes of afib w RVR, so lopressor increased to 25mg  BID. She continued to have episodes of AFib w RVR, with lopressor steadily increased. She last saw Dr. Graciela Husbands 01/2024 at which time amiodarone was started.   On follow-up today, her husband notes that her heart rate is always 60 when he checks her vital signs. He does not recall specific BP readings, but knows that she is to take 2.5mg  amlodipine if her systolic is 160 mmHg or higher. He estimates that she needs the PRN amlodipine about 1 / month.  She fell about a month ago, still has sore L hip. No LOC. Was seen by PCP for this.   She continues to take 200mg  amiodarone daily and 25mg  toprol BID.  She continues to take eliquis BID, no bleeding concerns.  Her legs are swollen today. She believes they are less swollen in the morning and increase throughotu the day. Her husband is unsure about this, but does note that her legs are frequently swollen.     Device Information: [employer] Jude single chamber PPM, imp 03/2023; tachy-brady  AAD  History: Amiodarone     ROS:  Please see the history of present illness. All other systems are reviewed and otherwise negative.    Physical Exam    VS:  BP (!) 156/79   Pulse 60   Ht 5\' 4"  (1.626 m)   Wt 144 lb 9.6 oz (65.6 kg)   SpO2 95%   BMI 24.82 kg/m  BMI: Body mass index is 24.82 kg/m.  Wt Readings from Last 3 Encounters:  03/22/24 144 lb 9.6 oz (65.6 kg)  03/21/24 145 lb 9.6 oz (66 kg)  02/29/24 137 lb 9.6 oz (62.4 kg)     GEN- The patient is well appearing, alert and oriented x 3 today.   Lungs- Clear to ausculation bilaterally, normal work of breathing.  Heart- Regular rate and rhythm, no murmurs, rubs or gallops Extremities- 1-2+ peripheral edema, warm, dry Skin-  device pocket well-healed, no tethering   Device interrogation done today and reviewed by myself:  Battery 9.6 years  Lead threshold, impedence, sensing stable  High VP as of late No episodes  Studies Reviewed   Previous EP, cardiology notes.    EKG is ordered. Personal review of EKG from today shows:   EKG Interpretation Date/Time:  Thursday March 22 2024 10:00:36 EDT Ventricular Rate:  60 PR Interval:    QRS Duration:  164 QT Interval:  482 QTC Calculation: 482 R Axis:   208  Text Interpretation: Ventricular-paced rhythm Confirmed by Sherie Don [phone]) on 03/22/2024  10:13:55 AM    TTE, 06/06/2023  1. Left ventricular ejection fraction, by estimation, is 55 to 60%. The left ventricle has normal function.   2. Right ventricular systolic function is normal. The right ventricular size is moderately enlarged. There is mildly elevated pulmonary artery systolic pressure.   3. Left atrial size was severely dilated.   4. Right atrial size was severely dilated.   5. The mitral valve is normal in structure. Mild mitral valve regurgitation.   6. Tricuspid valve regurgitation is moderate to severe.   7. The inferior vena cava is dilated in size with <50% respiratory variability, suggesting right  atrial pressure of 15 mmHg.   TTE, 04/08/2023  1. Left ventricular ejection fraction, by estimation, is 55 to 60%. The left ventricle has normal function. The left ventricle has no regional wall motion abnormalities. Left ventricular diastolic parameters are indeterminate.   2. Right ventricular systolic function is normal. The right ventricular size is mildly enlarged. There is moderately elevated pulmonary artery systolic pressure. The estimated right ventricular systolic pressure is 51.8 mmHg.   3. Left atrial size was severely dilated.   4. Right atrial size was severely dilated.   5. The mitral valve is normal in structure. Mild to moderate mitral valve regurgitation. No evidence of mitral stenosis.   6. Tricuspid valve regurgitation is mild to moderate.   7. The aortic valve is normal in structure. Aortic valve regurgitation is not visualized. No aortic stenosis is present.   8. The inferior vena cava is normal in size with greater than 50% respiratory variability, suggesting right atrial pressure of 3 mmHg.     Assessment and Plan     #) Perm afib/flutter #) amiodarone monitoring Recently started on amiodarone for rate-control Ventricular rates much better controlled Recent amio labs stable Continue 200mg  amiodarone daily, and 25mg  toprol BID  #) Hypercoag d/t perm afib CHA2DS2-VASc Score = at least 5 [CHF History: 0, HTN History: 1, Diabetes History: 0, Stroke History: 0, Vascular Disease History: 1, Age Score: 2, Gender Score: 1].  Therefore, the patient's annual risk of stroke is 7.2 %.    Stroke ppx - 2.5mg  eliquis BID, appropriately reduced for age, weight No bleeding concerns   #) Tachy-brady s/p PPM Lead measurements stable See paceart for details  #) lower extremity edema She denies SOB, chest pain, chest pressure Most recent TTE with preserved LVEF Increase lasix to 20mg  daily x 3 days, then reduce to daily as needed for increased edema       Current  medicines are reviewed at length with the patient today.   The patient has concerns regarding her medicines.  The following changes were made today:   START 20mg  lasix daily x 3 days, then reduce to as needed for lower extremity edema  Labs/ tests ordered today include:  Orders Placed This Encounter  Procedures   EKG 12-Lead     Disposition: Follow up with Dr. Graciela Husbands or EP APP in 3 months, sooner if needed   Signed, Sherie Don, NP  03/22/24  11:56 AM  Electrophysiology [facility]

## 2024-03-22 ENCOUNTER — Telehealth: Payer: Self-pay | Admitting: Cardiology

## 2024-03-22 ENCOUNTER — Ambulatory Visit: Payer: Medicare Other | Attending: Cardiology | Admitting: Cardiology

## 2024-03-22 ENCOUNTER — Encounter: Payer: Self-pay | Admitting: Cardiology

## 2024-03-22 VITALS — BP 156/79 | HR 60 | Ht 64.0 in | Wt 144.6 lb

## 2024-03-22 DIAGNOSIS — I4821 Permanent atrial fibrillation: Secondary | ICD-10-CM

## 2024-03-22 DIAGNOSIS — I495 Sick sinus syndrome: Secondary | ICD-10-CM | POA: Diagnosis not present

## 2024-03-22 DIAGNOSIS — Z79899 Other long term (current) drug therapy: Secondary | ICD-10-CM | POA: Diagnosis not present

## 2024-03-22 DIAGNOSIS — K589 Irritable bowel syndrome without diarrhea: Secondary | ICD-10-CM | POA: Insufficient documentation

## 2024-03-22 DIAGNOSIS — Z950 Presence of cardiac pacemaker: Secondary | ICD-10-CM

## 2024-03-22 DIAGNOSIS — D6869 Other thrombophilia: Secondary | ICD-10-CM

## 2024-03-22 DIAGNOSIS — Z5181 Encounter for therapeutic drug level monitoring: Secondary | ICD-10-CM

## 2024-03-22 LAB — CUP PACEART INCLINIC DEVICE CHECK
Battery Remaining Longevity: 115 mo
Battery Voltage: 3.02 V
Brady Statistic RV Percent Paced: 54 %
Date Time Interrogation Session: 20250403122315
Implantable Lead Connection Status: 753985
Implantable Lead Implant Date: 20240424
Implantable Lead Location: 753860
Implantable Lead Model: 1948
Implantable Pulse Generator Implant Date: 20240424
Lead Channel Impedance Value: 675 Ohm
Lead Channel Pacing Threshold Amplitude: 0.5 V
Lead Channel Pacing Threshold Amplitude: 0.5 V
Lead Channel Pacing Threshold Pulse Width: 0.5 ms
Lead Channel Pacing Threshold Pulse Width: 0.5 ms
Lead Channel Sensing Intrinsic Amplitude: 6 mV
Lead Channel Setting Pacing Amplitude: 2.5 V
Lead Channel Setting Pacing Pulse Width: 0.5 ms
Lead Channel Setting Sensing Sensitivity: 2 mV
Pulse Gen Model: 1272
Pulse Gen Serial Number: 8124077

## 2024-03-22 MED ORDER — METOPROLOL SUCCINATE ER 25 MG PO TB24: 25.0000 mg | ORAL_TABLET | Freq: Two times a day (BID) | ORAL | 2 refills | Status: DC

## 2024-03-22 MED ORDER — FUROSEMIDE 20 MG PO TABS: 20.0000 mg | ORAL_TABLET | ORAL | 1 refills | Status: DC | PRN

## 2024-03-22 NOTE — Telephone Encounter (Signed)
 Pharmacy called in stating pt remembers being told in February to take 4x daily. At appt today pt was told to go back to taking this med twice daily so she is going to go ahead and fill the way it was sent in today.

## 2024-03-22 NOTE — Assessment & Plan Note (Signed)
 She has had no chronic diarrhea , no weight loss,  no blood in stools.  Explained to husband that there is no indication for colonoscopy at this time/

## 2024-03-22 NOTE — Telephone Encounter (Signed)
 Called pharmacy- advised it was only 25 mg BID.  Pharmacy verbalized understanding, thankful for call back

## 2024-03-22 NOTE — Assessment & Plan Note (Signed)
 Husband would benefit from attending classes/seminars for caregivers of spouses with dementia.  Will initiate referral

## 2024-03-22 NOTE — Telephone Encounter (Signed)
 Pt c/o medication issue:  1. Name of Medication: metoprolol succinate (TOPROL-XL) 25 MG 24 hr tablet   2. How are you currently taking this medication (dosage and times per day)? N/A  3. Are you having a reaction (difficulty breathing--STAT)? No  4. What is your medication issue? Pt husband has been giving the pt 4 tablets daily and pharmacy calling in to get clear instructions. Please advise

## 2024-03-22 NOTE — Patient Instructions (Signed)
 Medication Instructions:   Take 20mg  lasix for 3 days in a row. After that, take lasix as needed for increased leg swelling.    *If you need a refill on your cardiac medications before your next appointment, please call your pharmacy*    Follow-Up: At [facility], you and your health needs are our priority.  As part of our continuing mission to provide you with exceptional heart care, our providers are all part of one team.  This team includes your primary Cardiologist (physician) and Advanced Practice Providers or APPs (Physician Assistants and Nurse Practitioners) who all work together to provide you with the care you need, when you need it.  Your next appointment:   3 month(s)  Provider:   Levy Sjogren, NP  Then, Lorine Bears, MD will plan to see you again in 5 month(s). We recommend signing up for the patient portal called "MyChart".  Sign up information is provided on this After Visit Summary.  MyChart is used to connect with patients for Virtual Visits (Telemedicine).  Patients are able to view lab/test results, encounter notes, upcoming appointments, etc.  Non-urgent messages can be sent to your provider as well.   To learn more about what you can do with MyChart, go to [email or web address].   Other Instructions   Check BP daily at least 1 hour after taking morning medications. Write the BP down, and bring the paper to all follow-up appointments.  Continue to take amlodipine only when blood pressure is greater than 160

## 2024-03-22 NOTE — Assessment & Plan Note (Signed)
 She is not having agitation or sundownig. I recommended  trying melatonin for  Her insomnia.  Explained that It is not a sedative,  But must be taken on  a regular basis to help your internal clock.  Take every evening after dinner start with 3 mg dose   Max effective dose is 6 mg

## 2024-03-26 ENCOUNTER — Telehealth: Payer: Self-pay

## 2024-03-26 NOTE — Progress Notes (Signed)
 Complex Care Management Note  Care Guide Note 03/26/2024 Name: KARALYN KADEL MRN: [id number] DOB: 12-24-1935  DAVISHA LINTHICUM is a 88 y.o. year old female who sees Darrick Huntsman, Mar Daring, MD for primary care. I reached out to [name] by phone today to offer complex care management services.  Ms. Asbill was given information about Complex Care Management services today including:   The Complex Care Management services include support from the care team which includes your Nurse Care Manager, Clinical Social Worker, or Pharmacist.  The Complex Care Management team is here to help remove barriers to the health concerns and goals most important to you. Complex Care Management services are voluntary, and the patient may decline or stop services at any time by request to their care team member.   Complex Care Management Consent Status: Patient agreed to services and verbal consent obtained.   Follow up plan:  Telephone appointment with complex care management team member scheduled for:  04/06/2024  Encounter Outcome:  Patient Scheduled  Penne Lash , RMA     [place]  [facility], [facility] Guide  Direct Dial: [phone]  Website: [email or web address]

## 2024-03-26 NOTE — Telephone Encounter (Signed)
 Called patient husband, advised that patient seems to be okay- however she does complain about her legs being tight at times. Patient husband aware of the following below, he will try lasix for a few times a week to see if any improvement, if not we will work on getting an appointment with gen cards.   Patient and husband agree with this plan.

## 2024-03-26 NOTE — Telephone Encounter (Signed)
 Pt husband called stating [name] [name] told him to call back if the pt legs swelling did not go down after taking her fluid pills. He is requesting a call back [place]

## 2024-03-26 NOTE — Telephone Encounter (Signed)
 Called patient husband- he states that patient is still continuing to have swelling- they did take Lasix for 3 days as recommended, but now questioning if this needs to be continued. The swelling occurs with no changes even if legs are elevated. Current weight today is 141.3 - last weight here in the office was 144, advised that weight did decrease, but husband said legs did not change.   He did give updated BP readings as well:  4/4- 136/68 HR 68 4/5- 136/66 HR 60 4/6- 131/60 HR 61 4/7- 129/67 HR 60   Advised I would route message and would review.  Do you recommend gen cards appointment?

## 2024-03-27 DIAGNOSIS — E1129 Type 2 diabetes mellitus with other diabetic kidney complication: Secondary | ICD-10-CM | POA: Diagnosis not present

## 2024-03-27 DIAGNOSIS — E876 Hypokalemia: Secondary | ICD-10-CM | POA: Diagnosis not present

## 2024-03-27 DIAGNOSIS — E871 Hypo-osmolality and hyponatremia: Secondary | ICD-10-CM | POA: Diagnosis not present

## 2024-03-27 DIAGNOSIS — E785 Hyperlipidemia, unspecified: Secondary | ICD-10-CM | POA: Diagnosis not present

## 2024-03-27 DIAGNOSIS — I10 Essential (primary) hypertension: Secondary | ICD-10-CM | POA: Diagnosis not present

## 2024-03-30 ENCOUNTER — Telehealth: Payer: Self-pay

## 2024-03-30 NOTE — Telephone Encounter (Signed)
 The pt husband called wanting Suzann to give him a call because he is concern about his wife potassium.

## 2024-03-30 NOTE — Telephone Encounter (Signed)
 Left a message for the patient to call back.

## 2024-04-02 NOTE — Telephone Encounter (Signed)
 Called patient husband, he states that his wife seen Kidney doctor- who advised she was swelling and advised her to start back on Lasix- patient husband is not sure if she needs to take Potassium tablet with the Lasix- however, advised that per the note from their office recommended to continue with Potassium. We did not have this on the current medication list, when asked how long to take Lasix patient husband is unsure. He will call Kidney doctor and advise of this and if potassium was needed.

## 2024-04-03 DIAGNOSIS — Z9181 History of falling: Secondary | ICD-10-CM | POA: Diagnosis not present

## 2024-04-03 DIAGNOSIS — Z7901 Long term (current) use of anticoagulants: Secondary | ICD-10-CM | POA: Diagnosis not present

## 2024-04-03 DIAGNOSIS — W19XXXD Unspecified fall, subsequent encounter: Secondary | ICD-10-CM | POA: Diagnosis not present

## 2024-04-03 DIAGNOSIS — S7012XD Contusion of left thigh, subsequent encounter: Secondary | ICD-10-CM | POA: Diagnosis not present

## 2024-04-03 DIAGNOSIS — D62 Acute posthemorrhagic anemia: Secondary | ICD-10-CM | POA: Diagnosis not present

## 2024-04-03 DIAGNOSIS — E7800 Pure hypercholesterolemia, unspecified: Secondary | ICD-10-CM | POA: Diagnosis not present

## 2024-04-03 DIAGNOSIS — S7002XD Contusion of left hip, subsequent encounter: Secondary | ICD-10-CM | POA: Diagnosis not present

## 2024-04-03 DIAGNOSIS — E1149 Type 2 diabetes mellitus with other diabetic neurological complication: Secondary | ICD-10-CM | POA: Diagnosis not present

## 2024-04-06 ENCOUNTER — Other Ambulatory Visit: Payer: Self-pay | Admitting: *Deleted

## 2024-04-11 DIAGNOSIS — Z8639 Personal history of other endocrine, nutritional and metabolic disease: Secondary | ICD-10-CM | POA: Diagnosis not present

## 2024-04-11 DIAGNOSIS — E871 Hypo-osmolality and hyponatremia: Secondary | ICD-10-CM | POA: Diagnosis not present

## 2024-04-12 ENCOUNTER — Telehealth: Payer: Self-pay | Admitting: *Deleted

## 2024-04-12 ENCOUNTER — Other Ambulatory Visit: Payer: Self-pay | Admitting: *Deleted

## 2024-04-12 ENCOUNTER — Ambulatory Visit (INDEPENDENT_AMBULATORY_CARE_PROVIDER_SITE_OTHER): Payer: Medicare Other

## 2024-04-12 DIAGNOSIS — I4821 Permanent atrial fibrillation: Secondary | ICD-10-CM | POA: Diagnosis not present

## 2024-04-12 LAB — CUP PACEART REMOTE DEVICE CHECK
Battery Remaining Longevity: 116 mo
Battery Remaining Percentage: 92 %
Battery Voltage: 3.02 V
Brady Statistic RV Percent Paced: 69 %
Date Time Interrogation Session: 20250424020015
Implantable Lead Connection Status: 753985
Implantable Lead Implant Date: 20240424
Implantable Lead Location: 753860
Implantable Lead Model: 1948
Implantable Pulse Generator Implant Date: 20240424
Lead Channel Impedance Value: 680 Ohm
Lead Channel Pacing Threshold Amplitude: 0.5 V
Lead Channel Pacing Threshold Pulse Width: 0.5 ms
Lead Channel Sensing Intrinsic Amplitude: 7.3 mV
Lead Channel Setting Pacing Amplitude: 2.5 V
Lead Channel Setting Pacing Pulse Width: 0.5 ms
Lead Channel Setting Sensing Sensitivity: 2 mV
Pulse Gen Model: 1272
Pulse Gen Serial Number: 8124077

## 2024-04-12 NOTE — Patient Outreach (Signed)
 Phone call to patient's spouse to complete initial assessment. Spouse reports not feeling well today and requested to re-schedule. Per spouse, his blood pressure was high 150/94 but is beginning to come back down to normal range. 122/70.  Patient's spouse encouraged to contact his provider if symptoms progress.  Initial appointment re-scheduled for 04/20/24.  Anderia Lorenzo, LCSW [place]  [facility], [facility] Licensed Clinical Social Worker Care Coordinator  Direct Dial: [phone]

## 2024-04-16 ENCOUNTER — Ambulatory Visit: Admitting: Dermatology

## 2024-04-16 DIAGNOSIS — D099 Carcinoma in situ, unspecified: Secondary | ICD-10-CM

## 2024-04-16 DIAGNOSIS — D492 Neoplasm of unspecified behavior of bone, soft tissue, and skin: Secondary | ICD-10-CM | POA: Diagnosis not present

## 2024-04-16 DIAGNOSIS — W908XXA Exposure to other nonionizing radiation, initial encounter: Secondary | ICD-10-CM

## 2024-04-16 DIAGNOSIS — L578 Other skin changes due to chronic exposure to nonionizing radiation: Secondary | ICD-10-CM

## 2024-04-16 DIAGNOSIS — L821 Other seborrheic keratosis: Secondary | ICD-10-CM | POA: Diagnosis not present

## 2024-04-16 DIAGNOSIS — D0439 Carcinoma in situ of skin of other parts of face: Secondary | ICD-10-CM

## 2024-04-16 DIAGNOSIS — L570 Actinic keratosis: Secondary | ICD-10-CM

## 2024-04-16 DIAGNOSIS — D485 Neoplasm of uncertain behavior of skin: Secondary | ICD-10-CM

## 2024-04-16 DIAGNOSIS — D0430 Carcinoma in situ of skin of unspecified part of face: Secondary | ICD-10-CM

## 2024-04-16 DIAGNOSIS — C7A8 Other malignant neuroendocrine tumors: Secondary | ICD-10-CM | POA: Diagnosis not present

## 2024-04-16 DIAGNOSIS — C44309 Unspecified malignant neoplasm of skin of other parts of face: Secondary | ICD-10-CM | POA: Diagnosis not present

## 2024-04-16 HISTORY — DX: Carcinoma in situ, unspecified: D09.9

## 2024-04-16 NOTE — Patient Instructions (Addendum)

## 2024-04-16 NOTE — Progress Notes (Signed)
 Follow-Up Visit   Subjective  [name] [name] is a 87 y.o. female who presents for the following: Painful growth on the left temple hairline. She has a few other spots on the face to check.    The following portions of the chart were reviewed this encounter and updated as appropriate: medications, allergies, medical history  Review of Systems:  No other skin or systemic complaints except as noted in HPI or Assessment and Plan.  Objective  Well appearing patient in no apparent distress; mood and affect are within normal limits.  A focused examination was performed of the following areas: Face  Relevant physical exam findings are noted in the Assessment and Plan.  Left Temporal Hairline 1.1 cm pink keratotic nodule  L lat eyebrow x 1 Keratotic papule.  Assessment & Plan  ACTINIC DAMAGE - chronic, secondary to cumulative UV radiation exposure/sun exposure over time - diffuse scaly erythematous macules with underlying dyspigmentation - Recommend daily broad spectrum sunscreen SPF 30+ to sun-exposed areas, reapply every 2 hours as needed.  - Recommend staying in the shade or wearing long sleeves, sun glasses (UVA+UVB protection) and wide brim hats (4-inch brim around the entire circumference of the hat). - Call for new or changing lesions.  SEBORRHEIC KERATOSIS - Stuck-on, waxy, tan-brown papules left cheek - Benign-appearing - Discussed benign etiology and prognosis. - Observe - Call for any changes  NEOPLASM OF UNCERTAIN BEHAVIOR OF SKIN Left Temporal Hairline Epidermal / dermal shaving  Lesion diameter (cm):  2.5 Informed consent: discussed and consent obtained   Patient was prepped and draped in usual sterile fashion: Area prepped with alcohol . Anesthesia: the lesion was anesthetized in a standard fashion   Anesthetic:  1% lidocaine  w/ epinephrine  1-100,000 buffered w/ 8.4% NaHCO3 Instrument used: flexible razor blade   Hemostasis achieved with: pressure, aluminum  chloride and electrodesiccation   Outcome: patient tolerated procedure well    Destruction of lesion  Destruction method: electrodesiccation and curettage   Informed consent: discussed and consent obtained   Curettage performed in three different directions: Yes   Electrodesiccation performed over the curetted area: Yes   Final wound size (cm):  2.5 Hemostasis achieved with:  pressure, aluminum chloride and electrodesiccation Outcome: patient tolerated procedure well with no complications   Post-procedure details: wound care instructions given   Post-procedure details comment:  Ointment and bandage applied. Specimen 1 - Surgical pathology Differential Diagnosis: r/o BCC/SCC Check Margins: No EDC today EDC today HYPERTROPHIC ACTINIC KERATOSIS L lat eyebrow x 1 Actinic keratoses are precancerous spots that appear secondary to cumulative UV radiation exposure/sun exposure over time. They are chronic with expected duration over 1 year. A portion of actinic keratoses will progress to squamous cell carcinoma of the skin. It is not possible to reliably predict which spots will progress to skin cancer and so treatment is recommended to prevent development of skin cancer.  Recommend daily broad spectrum sunscreen SPF 30+ to sun-exposed areas, reapply every 2 hours as needed.  Recommend staying in the shade or wearing long sleeves, sun glasses (UVA+UVB protection) and wide brim hats (4-inch brim around the entire circumference of the hat). Call for new or changing lesions. Destruction of lesion - L lat eyebrow x 1  Destruction method: cryotherapy   Informed consent: discussed and consent obtained   Lesion destroyed using liquid nitrogen: Yes   Region frozen until ice ball extended beyond lesion: Yes   Outcome: patient tolerated procedure well with no complications   Post-procedure details: wound care  instructions given   Additional details:  Prior to procedure, discussed risks of blister  formation, small wound, skin dyspigmentation, or rare scar following cryotherapy. Recommend Vaseline ointment to treated areas while healing.    Return as scheduled with Dr Orval Blanc, Bernardine Bridegroom, CMA, am acting as scribe for Artemio Larry, MD .   Documentation: I have reviewed the above documentation for accuracy and completeness, and I agree with the above.  Artemio Larry, MD

## 2024-04-20 ENCOUNTER — Telehealth: Admitting: *Deleted

## 2024-04-20 ENCOUNTER — Other Ambulatory Visit: Payer: Self-pay | Admitting: *Deleted

## 2024-04-25 ENCOUNTER — Other Ambulatory Visit: Payer: Self-pay | Admitting: *Deleted

## 2024-04-25 ENCOUNTER — Other Ambulatory Visit: Payer: Self-pay

## 2024-04-25 ENCOUNTER — Emergency Department

## 2024-04-25 ENCOUNTER — Emergency Department
Admission: EM | Admit: 2024-04-25 | Discharge: 2024-04-25 | Disposition: A | Discharge status: Home / Self Care | Attending: Emergency Medicine | Admitting: Emergency Medicine

## 2024-04-25 DIAGNOSIS — S40022A Contusion of left upper arm, initial encounter: Secondary | ICD-10-CM | POA: Diagnosis not present

## 2024-04-25 DIAGNOSIS — S199XXA Unspecified injury of neck, initial encounter: Secondary | ICD-10-CM | POA: Diagnosis not present

## 2024-04-25 DIAGNOSIS — W010XXA Fall on same level from slipping, tripping and stumbling without subsequent striking against object, initial encounter: Secondary | ICD-10-CM | POA: Insufficient documentation

## 2024-04-25 DIAGNOSIS — Z7901 Long term (current) use of anticoagulants: Secondary | ICD-10-CM | POA: Diagnosis not present

## 2024-04-25 DIAGNOSIS — Z85828 Personal history of other malignant neoplasm of skin: Secondary | ICD-10-CM | POA: Diagnosis not present

## 2024-04-25 DIAGNOSIS — I129 Hypertensive chronic kidney disease with stage 1 through stage 4 chronic kidney disease, or unspecified chronic kidney disease: Secondary | ICD-10-CM | POA: Diagnosis not present

## 2024-04-25 DIAGNOSIS — N189 Chronic kidney disease, unspecified: Secondary | ICD-10-CM | POA: Insufficient documentation

## 2024-04-25 DIAGNOSIS — S51812A Laceration without foreign body of left forearm, initial encounter: Secondary | ICD-10-CM | POA: Diagnosis not present

## 2024-04-25 DIAGNOSIS — W19XXXA Unspecified fall, initial encounter: Secondary | ICD-10-CM

## 2024-04-25 DIAGNOSIS — R9082 White matter disease, unspecified: Secondary | ICD-10-CM | POA: Insufficient documentation

## 2024-04-25 DIAGNOSIS — S59912A Unspecified injury of left forearm, initial encounter: Secondary | ICD-10-CM | POA: Diagnosis present

## 2024-04-25 DIAGNOSIS — S51819A Laceration without foreign body of unspecified forearm, initial encounter: Secondary | ICD-10-CM | POA: Diagnosis not present

## 2024-04-25 DIAGNOSIS — S0990XA Unspecified injury of head, initial encounter: Secondary | ICD-10-CM | POA: Diagnosis not present

## 2024-04-25 DIAGNOSIS — M85832 Other specified disorders of bone density and structure, left forearm: Secondary | ICD-10-CM | POA: Diagnosis not present

## 2024-04-25 MED ORDER — ACETAMINOPHEN 325 MG PO TABS
650.0000 mg | ORAL_TABLET | Freq: Once | ORAL | Status: AC
  Administered 2024-04-25: 650 mg via ORAL
  Filled 2024-04-25: qty 2

## 2024-04-25 NOTE — ED Notes (Signed)
 Patient assisted to use the restroom at this time. Patient ambulatory with steady gait with standby assistance. Patient urinated and then returned to room. Patient did endorse dizziness when standing up from the toilet.

## 2024-04-25 NOTE — ED Triage Notes (Signed)
 Pt to ED with husband for mechanical fall. Stood up and fell backward on cement floor today. No bleeding seen on head. Pt takes Eliquis . Denies LOC. Has large hematoma to L forearm, unwrapped in triage and re wrapped. Pt complains of pain to hematoma. Also skin tear to L elbow. PA examining pt. Last Tdap unknown.

## 2024-04-25 NOTE — Discharge Instructions (Addendum)
 You were evaluated in the ED following a fall.  Your head CT and cervical spine CT are normal.  Your left arm x-ray is normal with the exception of a large hematoma due to your fall.  Get plenty of rest.  Elevate the affected arm above heart level to help reduce swelling.  Apply ice 3 times daily 10 to 20 minutes to also help with swelling.  The area should resolve in 5 to 7 days.  Take Tylenol  for pain as needed.  Follow-up with your PCP in 1 week for resolution of hematoma.  Monitor for development of fever and signs of infection including pus, increased redness and pain at the area.

## 2024-04-25 NOTE — ED Provider Notes (Signed)
 [facility] Emergency Department Provider Note     Event Date/Time   First MD Initiated Contact with Patient 04/25/24 2001     (approximate)   History   Fall   HPI  [name] [name] is a 88 y.o. female with a history of HTN, CKD, squamous cell carcinoma of skin, HLD and A-fib presents to the ED for a evaluation of a mechanical fall earlier today.  Patient reports she tripped and fell backward onto her left elbow.  Complains of left arm pain with notable hematoma.  Skin tear noted to elbow.  She denies head injury or LOC.  Patient reports she does take Eliquis .  No other complaint.     Physical Exam   Triage Vital Signs: ED Triage Vitals  Encounter Vitals Group     BP 04/25/24 1805 135/67     Systolic BP Percentile --      Diastolic BP Percentile --      Pulse Rate 04/25/24 1805 (!) 59     Resp 04/25/24 1805 20     Temp 04/25/24 1805 98 F (36.7 C)     Temp Source 04/25/24 1805 Oral     SpO2 04/25/24 1805 97 %     Weight 04/25/24 1807 140 lb (63.5 kg)     Height 04/25/24 1807 5\' 5"  (1.651 m)     Head Circumference --      Peak Flow --      Pain Score 04/25/24 1802 10     Pain Loc --      Pain Education --      Exclude from Growth Chart --     Most recent vital signs: Vitals:   04/25/24 1805 04/25/24 2004  BP: 135/67 (!) 156/80  Pulse: (!) 59 60  Resp: 20 17  Temp: 98 F (36.7 C) 97.9 F (36.6 C)  SpO2: 97% 98%    General: Well appearing. Alert and oriented. INAD.      Head:  NCAT.  Eyes:  PERRLA. EOMI.  Ears:  No postauricular ecchymosis. Nose:   Mucosa is moist. No rhinorrhea.. Neck:   No cervical midline spine tenderness to palpation. Full ROM without difficulty.  CV:  Good peripheral perfusion. RRR. No peripheral edema.  RESP:  Normal effort. LCTAB.   MSK:   Full ROM in all joints.  Left proximal forearm reveals a traumatic hematoma with 1 cm central skin tear.  Skin tear to lateral aspect of left elbow.  Full range of  motion without difficulty.  Neurovascular status intact all throughout.  Normal and brisk capillary refill. NEURO: Cranial nerves intact. No focal deficits. Sensation and motor function intact. Gait is steady.     ED Results / Procedures / Treatments   Labs (all labs ordered are listed, but only abnormal results are displayed) Labs Reviewed - No data to display  RADIOLOGY  I personally viewed and evaluated these images as part of my medical decision making, as well as reviewing the written report by the radiologist.  ED Provider Interpretation: Normal head and cervical CT.  CT Head Wo Contrast Result Date: 04/25/2024 CLINICAL DATA:  Trauma, fall EXAM: CT HEAD WITHOUT CONTRAST CT CERVICAL SPINE WITHOUT CONTRAST TECHNIQUE: Multidetector CT imaging of the head and cervical spine was performed following the standard protocol without intravenous contrast. Multiplanar CT image reconstructions of the cervical spine were also generated. RADIATION DOSE REDUCTION: This exam was performed according to the departmental dose-optimization program which includes automated exposure control,  adjustment of the mA and/or kV according to patient size and/or use of iterative reconstruction technique. COMPARISON:  03/24/2020 FINDINGS: CT HEAD FINDINGS Brain: No evidence of acute infarction, hemorrhage, hydrocephalus, extra-axial collection or mass lesion/mass effect. Periventricular white hypodensity. Vascular: No hyperdense vessel or unexpected calcification. Skull: Normal. Negative for fracture or focal lesion. Sinuses/Orbits: No acute finding. Other: None. CT CERVICAL SPINE FINDINGS Alignment: Degenerative straightening of the normal cervical lordosis. Skull base and vertebrae: No acute fracture. No primary bone lesion or focal pathologic process. Soft tissues and spinal canal: No prevertebral fluid or swelling. No visible canal hematoma. Disc levels: Moderate to severe multilevel disc degenerative disease throughout  the cervical spine. Upper chest: Negative. Other: None. IMPRESSION: 1. No acute intracranial pathology. Small-vessel white matter disease. 2. No fracture or static subluxation of the cervical spine. 3. Moderate to severe multilevel disc degenerative disease throughout the cervical spine. Electronically Signed   By: Fredricka Jenny M.D.   On: 04/25/2024 18:45   CT Cervical Spine Wo Contrast Result Date: 04/25/2024 CLINICAL DATA:  Trauma, fall EXAM: CT HEAD WITHOUT CONTRAST CT CERVICAL SPINE WITHOUT CONTRAST TECHNIQUE: Multidetector CT imaging of the head and cervical spine was performed following the standard protocol without intravenous contrast. Multiplanar CT image reconstructions of the cervical spine were also generated. RADIATION DOSE REDUCTION: This exam was performed according to the departmental dose-optimization program which includes automated exposure control, adjustment of the mA and/or kV according to patient size and/or use of iterative reconstruction technique. COMPARISON:  03/24/2020 FINDINGS: CT HEAD FINDINGS Brain: No evidence of acute infarction, hemorrhage, hydrocephalus, extra-axial collection or mass lesion/mass effect. Periventricular white hypodensity. Vascular: No hyperdense vessel or unexpected calcification. Skull: Normal. Negative for fracture or focal lesion. Sinuses/Orbits: No acute finding. Other: None. CT CERVICAL SPINE FINDINGS Alignment: Degenerative straightening of the normal cervical lordosis. Skull base and vertebrae: No acute fracture. No primary bone lesion or focal pathologic process. Soft tissues and spinal canal: No prevertebral fluid or swelling. No visible canal hematoma. Disc levels: Moderate to severe multilevel disc degenerative disease throughout the cervical spine. Upper chest: Negative. Other: None. IMPRESSION: 1. No acute intracranial pathology. Small-vessel white matter disease. 2. No fracture or static subluxation of the cervical spine. 3. Moderate to severe  multilevel disc degenerative disease throughout the cervical spine. Electronically Signed   By: Fredricka Jenny M.D.   On: 04/25/2024 18:45   DG Forearm Left Result Date: 04/25/2024 CLINICAL DATA:  Fall and trauma to the left upper extremity. EXAM: LEFT FOREARM - 2 VIEW COMPARISON:  Left wrist radiograph dated 11/30/2014. FINDINGS: No acute fracture or dislocation. The bones are osteopenic. Laceration of the skin and soft tissues of the proximal forearm with subcutaneous hematoma. IMPRESSION: 1. No acute fracture or dislocation. 2. Laceration of the proximal forearm with subcutaneous hematoma. Electronically Signed   By: Angus Bark M.D.   On: 04/25/2024 18:26    PROCEDURES:  Critical Care performed: No  Procedures   MEDICATIONS ORDERED IN ED: Medications  acetaminophen  (TYLENOL ) tablet 650 mg (650 mg Oral Given 04/25/24 2108)    IMPRESSION / MDM / ASSESSMENT AND PLAN / ED COURSE  I reviewed the triage vital signs and the nursing notes.                                 88 y.o. female presents to the emergency department for evaluation and treatment of mechanical fall sustaining a left arm injury. See  HPI for further details.   Differential diagnosis includes, but is not limited to ICH, skin tear, fracture  Patient's presentation is most consistent with acute complicated illness / injury requiring diagnostic workup.  Patient is alert and oriented.  She is hemodynamically stable.  Physical exam findings are stated above.  Overall benign with the exception of a traumatic hematoma to the left proximal forearm and skin tear to lateral aspect of elbow.  Normal neuroexam.  CT head and cervical spine are reassuring.  Left forearm shows no acute bony abnormality.  Area is cleansed thoroughly with chlorhexidine  and saline pressure wash.  Dressing applied with hemostatic gauze, abdominal pad, gauze wrap and Coban wrap for compression.  Patient placed in sling for comfort.  Patient is encouraged to  follow-up with her primary care to monitor area.  RICE education therapy provided to patient which she verbalized understanding.  Thorough patient education discharge instructions provided.  Patient is in stable condition for discharge home and outpatient management.  ED return precautions discussed including monitoring for signs of infection.  All question concerns were addressed during this ED visit.   FINAL CLINICAL IMPRESSION(S) / ED DIAGNOSES   Final diagnoses:  Fall, initial encounter  Hematoma of arm, left, initial encounter     Rx / DC Orders   ED Discharge Orders     None        Note:  This document was prepared using Dragon voice recognition software and may include unintentional dictation errors.    Phyllis Breeze, Maysie Parkhill A, PA-C 04/25/24 2153    Claria Crofts, MD 04/25/24 [phone]

## 2024-04-25 NOTE — Patient Outreach (Addendum)
 Complex Care Management   Visit Note  04/25/2024  Name:  [name] [name] MRN: [id number] DOB: 1936/08/15  Situation: Referral received for Complex Care Management related to Dementia I obtained verbal consent from Caregiver.  Visit completed with spouse/caregiver  on the phone  Background:   Past Medical History:  Diagnosis Date   Actinic keratosis    Arthritis    Atrial flutter (HCC) 02/2011   s/p cardioversion    Chest pain    a. H/o cardiac cath x 2-> 2012 -->nl cors;  b. 12/2016 MV: EF 61%, small region of mild perfusion defect in the apical anteroseptal region c/w breast attenuation, no ischemia-->Low risk; c. 06/2019 MV: Mod size, mild inflat ischemia, EF 59%. Cor and Ao Ca2+. Inflat defect more pronounced on this study compared to last; c. 07/2019 Cath: LM nl, LAD min irregs, LCX nl, OM1/2/3 nl, RCA min irregs.   CKD (chronic kidney disease), stage III (HCC)    Concussion with no loss of consciousness 09/01/2016   Cystocele    Degenerative disorder of bone    GERD (gastroesophageal reflux disease)    Headache(784.0)    chronic   Hiatal hernia    Hyperlipidemia    Hypertension    Influenza 01/10/2017   Knee fracture    Migraines    Mobitz type 2 second degree atrioventricular block    a. felt to be 2/2 amiodarone , resolved with decreased amiodarone  dose.  Amio since d/c'd.   Permanent atrial fibrillation (HCC)    a. status post multiple DCCVs; b. 2018 - eval for PVI but opted for rate control.   PONV (postoperative nausea and vomiting)    oxycodone and codiene cause N/V    Pre-syncope    a. In setting of dehydration and AKI in the past.   Sleep apnea    Squamous cell carcinoma of skin 11/10/2020   left distal posterior deltoid (EDC 01/15/2021)   Tachycardia induced cardiomyopathy (HCC)    a. Resolved;  b. 08/2017 Echo: EF 50-55%, no rwma, mild MR, mildly to mod dil LA/RA; c. 02/2018 Echo: EF 55-60%, mild MR. Mildly dil LA. Nl RVSP. PASP .   Venous insufficiency     Vertigo     Assessment: Patient Reported Symptoms:  Cognitive Cognitive Status: Able to follow simple commands, Difficulties with attention and concentration, Struggling with memory recall Cognitive/Intellectual Conditions Management [RPT]: None reported or documented in medical history or problem list   Health Maintenance Behaviors: Annual physical exam Healing Pattern: Unsure  Neurological Neurological Review of Symptoms: Dizziness Neurological Conditions: Dementia Neurological Management Strategies: Medication therapy Neurological Comment: Saw Dr. Bernetta Brilliant few weeks ago, prescribed omeprazole  for 14 days for acid reflux. Dr, Bernetta Brilliant recommended that patient's spouse call patient's provider for a re-fill  HEENT HEENT Symptoms Reported: No symptoms reported      Cardiovascular Cardiovascular Symptoms Reported: Dizziness Does patient have uncontrolled Hypertension?: No Cardiovascular Conditions: Coronary artery disease, Heart failure, Hypertension, High blood cholesterol (has a pacemaker) Cardiovascular Management Strategies: Medication therapy, Routine screening  Respiratory Respiratory Symptoms Reported: Dry cough Other Respiratory Symptoms: coughs at night-takes cough syrup    Endocrine Patient reports the following symptoms related to hypoglycemia or hyperglycemia : No symptoms reported Is patient diabetic?: No (per spouse , patient does not have diabetes)    Gastrointestinal Gastrointestinal Symptoms Reported: Other Other Gastrointestinal Symptoms: bowel movement after she eats   Nutrition Risk Screen (CP): No indicators present  Genitourinary Genitourinary Symptoms Reported: No symptoms reported    Integumentary Integumentary Symptoms  Reported: No symptoms reported    Musculoskeletal Musculoskelatal Symptoms Reviewed: Unsteady gait Musculoskeletal Conditions: Rheumatoid arthritis Musculoskeletal Management Strategies: Medication therapy Falls in the past year?: Yes Number of  falls in past year: 2 or more Was there an injury with Fall?: Yes Fall Risk Category Calculator: 3 Patient Fall Risk Level: High Fall Risk Patient at Risk for Falls Due to: Impaired balance/gait Fall risk Follow up: Falls prevention discussed (encouraged consideration of a cane or walker, spouse declines need)  Psychosocial Psychosocial Symptoms Reported: Sadness - if selected complete PHQ 2-9 Additional Psychological Details: last few days been "sort of down" Behavioral Health Conditions: Other Other Behavorial Health Conditions: sadness, not sure of trigger Behaviors When Feeling Stressed/Fearful: "we just work through it" works in garden , does [employer] of Family Relationships: supportive Do you feel physically threatened by others?: No      04/25/2024    3:44 PM  Depression screen PHQ 2/9  Decreased Interest 1  Down, Depressed, Hopeless 1  PHQ - 2 Score 2  Altered sleeping 0  Tired, decreased energy 0  Change in appetite 0  Feeling bad or failure about yourself  0  Trouble concentrating 3  Moving slowly or fidgety/restless 1  Suicidal thoughts 0  PHQ-9 Score 6    There were no vitals filed for this visit.  Medications Reviewed Today     Reviewed by Ave Leisure, LCSW (Social Worker) on 04/25/24 at 1932  Med List Status: <None>   Medication Order Taking? Sig Documenting Provider Last Dose Status Informant  acetaminophen  (TYLENOL ) 500 MG tablet [id number] Yes Take 1,000 mg by mouth every 6 (six) hours as needed (pain).  [provider] Taking Active Self  aluminum-magnesium  hydroxide 200-200 MG/5ML suspension [id number] No Take 20 mLs by mouth every 6 (six) hours as needed for indigestion.   Patient not taking: Reported on 04/25/2024   [provider] Not Taking Active Self  amiodarone  (PACERONE ) 200 MG tablet [id number] Yes Take 1 tablet (200 mg) by mouth twice daily for 4 weeks, then decrease to 1 tablet (200 mg) by mouth daily Verona Goodwill, MD  Taking Active   amLODipine  (NORVASC ) 2.5 MG tablet [id number] Yes Take 2.5 mg by mouth daily as needed. [provider] Taking Active   amoxicillin  (AMOXIL ) 500 MG capsule [id number] Yes Take by mouth. [provider] Taking Active   apixaban  (ELIQUIS ) 2.5 MG TABS tablet [id number] Yes Take 1 tablet (2.5 mg total) by mouth 2 (two) times daily. Verona Goodwill, MD Taking Active   Carboxymethylcellul-Glycerin [facility] EYE DROPS OP) [id number] Yes Place 1 drop into both eyes daily as needed (dry eyes). [provider] Taking Active Self  cholecalciferol  (VITAMIN D ) 1000 units tablet [id number] Yes Take 1,000 Units by mouth daily. [provider] Taking Active Self  cyanocobalamin  (VITAMIN B12) 500 MCG tablet [id number] Yes Take 500 mcg by mouth daily. [provider] Taking Active   donepezil  (ARICEPT  ODT) 10 MG disintegrating tablet [id number] Yes Take 1 tablet by mouth at bedtime. [provider] Taking Active   furosemide  (LASIX ) 20 MG tablet [id number] Yes Take 1 tablet (20 mg total) by mouth as needed for fluid or edema. Riddle, Suzann, NP Taking Active   metoprolol  succinate (TOPROL -XL) 25 MG 24 hr tablet [id number] Yes Take 1 tablet (25 mg total) by mouth 2 (two) times daily. Riddle, Suzann, NP Taking Active   pravastatin  (PRAVACHOL ) 20 MG tablet [id number] Yes TAKE 1 TABLET BY MOUTH DAILY Arida, Muhammad A,  MD Taking Active   sodium chloride  (OCEAN) 0.65 % SOLN nasal spray [id number] Yes Place 1 spray into both nostrils daily as needed for congestion. [provider] Taking Active Self            Recommendation:   PCP Follow-up  Follow Up Plan:   Telephone follow-up 05/18/24  Albaro Deviney, LCSW [place]  [facility], [facility] Licensed Clinical Social Worker Care Coordinator  Direct Dial: [phone]

## 2024-04-25 NOTE — ED Provider Triage Note (Signed)
 Emergency Medicine Provider Triage Evaluation Note  [name] [name] , a 88 y.o. female  was evaluated in triage.  Pt complains of mechanical fall sustaining injury to left arm. Hematoma noted to proximal humerus and skin tear to elbow. Endorse hitting head. No LOC. On Eliquis .   Review of Systems  Positive:  Negative:   Physical Exam  BP 135/67 (BP Location: Right Arm)   Pulse (!) 59   Temp 98 F (36.7 C) (Oral)   Resp 20   Ht 5\' 5"  (1.651 m)   Wt 63.5 kg   SpO2 97%   BMI 23.30 kg/m  Gen:   Awake, no distress   Resp:  Normal effort  MSK:   Moves extremities without difficulty  Other:  Hematoma to left arm.   Medical Decision Making  Medically screening exam initiated at 6:09 PM.  Appropriate orders placed.  Lynford Sarin was informed that the remainder of the evaluation will be completed by another provider, this initial triage assessment does not replace that evaluation, and the importance of remaining in the ED until their evaluation is complete.    Phyllis Breeze, Macaiah Mangal A, PA-C 04/25/24 1810

## 2024-04-25 NOTE — Patient Instructions (Addendum)
 Visit Information  Thank you for taking time to visit with me today. Please don't hesitate to contact me if I can be of assistance to you before our next scheduled appointment.  Our next appointment is by telephone on 05/18/24 at 1pm Please call the care guide team at [phone] if you need to cancel or reschedule your appointment.   Following is a copy of your care plan:   Goals Addressed             This Visit's Progress               VBCI Social Work Care Plan       Problems:   Care Coordination needs related to Dementia:   CSW Clinical Goal(s):   Over the next 90 days the Caregiver will  provide community  .  Interventions:  Dementia Care:   Current level of care: Home with other family or significant other(s): spouse  Evaluation of patient safety in current living environment and review of Dementia resources and support ([facility] resources) ADL's Assessed needs, level of care concerns, how currently meeting needs and barriers to care Discussed family support and building support system : discussed community support options ie caregiver support programs through [facility] as well as the Alzheimer's and Dementia associations  Patient Goals/Self-Care Activities:  Patient to review resources from [facility] to follow up on caregiver resources and support.  Plan:   Telephone follow up appointment with care management team member scheduled for:  05/18/24        Please call the Suicide and Crisis Lifeline: 988 if you are experiencing a Mental Health or Behavioral Health Crisis or need someone to talk to.  Patient verbalizes understanding of instructions and care plan provided today and agrees to view in MyChart. Active MyChart status and patient understanding of how to access instructions and care plan via MyChart confirmed with patient.     Aubert Choyce, LCSW [place]  [facility], [facility] Licensed Clinical  Social Worker Care Coordinator  Direct Dial: [phone]

## 2024-04-26 ENCOUNTER — Telehealth: Payer: Self-pay

## 2024-04-26 LAB — SURGICAL PATHOLOGY

## 2024-04-26 NOTE — Transitions of Care (Post Inpatient/ED Visit) (Signed)
 04/26/2024  Name: [name] [name] MRN: [id number] DOB: 31-Dec-1935  Today's TOC FU Call Status: Today's TOC FU Call Status:: Successful TOC FU Call Completed TOC FU Call Complete Date: 04/26/24 Patient's Name and Date of Birth confirmed.  Transition Care Management Follow-up Telephone Call Date of Discharge: 04/25/24 Discharge Facility: [facility]) Type of Discharge: Emergency Department Reason for ED Visit: Other: (fall) How have you been since you were released from the hospital?: Better Any questions or concerns?: No  Items Reviewed: Did you receive and understand the discharge instructions provided?: No Medications obtained,verified, and reconciled?: Yes (Medications Reviewed) Any new allergies since your discharge?: No Dietary orders reviewed?: Yes Do you have support at home?: Yes People in Home [RPT]: spouse  Medications Reviewed Today: Medications Reviewed Today     Reviewed by Darrall Ellison, LPN (Licensed Practical Nurse) on 04/26/24 at 1440  Med List Status: <None>   Medication Order Taking? Sig Documenting Provider Last Dose Status Informant  acetaminophen  (TYLENOL ) 500 MG tablet [id number] No Take 1,000 mg by mouth every 6 (six) hours as needed (pain).  [provider] Taking Active Self  aluminum-magnesium  hydroxide 200-200 MG/5ML suspension [id number] No Take 20 mLs by mouth every 6 (six) hours as needed for indigestion.   Patient not taking: Reported on 04/25/2024   [provider] Not Taking Active Self  amiodarone  (PACERONE ) 200 MG tablet [id number] No Take 1 tablet (200 mg) by mouth twice daily for 4 weeks, then decrease to 1 tablet (200 mg) by mouth daily Verona Goodwill, MD Taking Active   amLODipine  (NORVASC ) 2.5 MG tablet [id number] No Take 2.5 mg by mouth daily as needed. [provider] Taking Active   amoxicillin  (AMOXIL ) 500 MG capsule [id number] No Take by mouth. [provider] Taking Active    apixaban  (ELIQUIS ) 2.5 MG TABS tablet [id number] No Take 1 tablet (2.5 mg total) by mouth 2 (two) times daily. Verona Goodwill, MD Taking Active   Carboxymethylcellul-Glycerin [facility] EYE DROPS OP) [id number] No Place 1 drop into both eyes daily as needed (dry eyes). [provider] Taking Active Self  cholecalciferol  (VITAMIN D ) 1000 units tablet [id number] No Take 1,000 Units by mouth daily. [provider] Taking Active Self  cyanocobalamin  (VITAMIN B12) 500 MCG tablet [id number] No Take 500 mcg by mouth daily. [provider] Taking Active   donepezil  (ARICEPT  ODT) 10 MG disintegrating tablet [id number] No Take 1 tablet by mouth at bedtime. [provider] Taking Active   furosemide  (LASIX ) 20 MG tablet [id number] No Take 1 tablet (20 mg total) by mouth as needed for fluid or edema. Riddle, Suzann, NP Taking Active   metoprolol  succinate (TOPROL -XL) 25 MG 24 hr tablet [id number] No Take 1 tablet (25 mg total) by mouth 2 (two) times daily. Riddle, Suzann, NP Taking Active   pravastatin  (PRAVACHOL ) 20 MG tablet [id number] No TAKE 1 TABLET BY MOUTH DAILY Arida, Muhammad A, MD Taking Active   sodium chloride  (OCEAN) 0.65 % SOLN nasal spray [id number] No Place 1 spray into both nostrils daily as needed for congestion. [provider] Taking Active Self            Home Care and Equipment/Supplies: Were Home Health Services Ordered?: NA Any new equipment or medical supplies ordered?: NA  Functional Questionnaire: Do you need assistance with bathing/showering or dressing?: Yes Do you need assistance with meal preparation?: Yes Do you need assistance with eating?: No Do you have difficulty maintaining continence: No Do you  need assistance with getting out of bed/getting out of a chair/moving?: No Do you have difficulty managing or taking your medications?: Yes  Follow up appointments reviewed: PCP Follow-up appointment confirmed?: No (husband will  call back to schedule) MD Provider Line [phone] Given: No Specialist Hospital Follow-up appointment confirmed?: NA Do you need transportation to your follow-up appointment?: No Do you understand care options if your condition(s) worsen?: Yes-patient verbalized understanding    SIGNATURE Darrall Ellison, LPN [facility] Nurse Health Advisor Direct Dial [phone]

## 2024-05-02 ENCOUNTER — Encounter: Payer: Self-pay | Admitting: Dermatology

## 2024-05-02 ENCOUNTER — Ambulatory Visit: Payer: Medicare Other | Admitting: Dermatology

## 2024-05-02 DIAGNOSIS — L814 Other melanin hyperpigmentation: Secondary | ICD-10-CM | POA: Diagnosis not present

## 2024-05-02 DIAGNOSIS — D692 Other nonthrombocytopenic purpura: Secondary | ICD-10-CM

## 2024-05-02 DIAGNOSIS — L578 Other skin changes due to chronic exposure to nonionizing radiation: Secondary | ICD-10-CM

## 2024-05-02 DIAGNOSIS — L817 Pigmented purpuric dermatosis: Secondary | ICD-10-CM

## 2024-05-02 DIAGNOSIS — D0439 Carcinoma in situ of skin of other parts of face: Secondary | ICD-10-CM | POA: Diagnosis not present

## 2024-05-02 DIAGNOSIS — I872 Venous insufficiency (chronic) (peripheral): Secondary | ICD-10-CM

## 2024-05-02 DIAGNOSIS — D485 Neoplasm of uncertain behavior of skin: Secondary | ICD-10-CM

## 2024-05-02 DIAGNOSIS — C7A8 Other malignant neuroendocrine tumors: Secondary | ICD-10-CM | POA: Diagnosis not present

## 2024-05-02 DIAGNOSIS — D099 Carcinoma in situ, unspecified: Secondary | ICD-10-CM

## 2024-05-02 DIAGNOSIS — D1801 Hemangioma of skin and subcutaneous tissue: Secondary | ICD-10-CM

## 2024-05-02 DIAGNOSIS — D3A8 Other benign neuroendocrine tumors: Secondary | ICD-10-CM

## 2024-05-02 DIAGNOSIS — D229 Melanocytic nevi, unspecified: Secondary | ICD-10-CM

## 2024-05-02 DIAGNOSIS — W908XXA Exposure to other nonionizing radiation, initial encounter: Secondary | ICD-10-CM | POA: Diagnosis not present

## 2024-05-02 DIAGNOSIS — L821 Other seborrheic keratosis: Secondary | ICD-10-CM

## 2024-05-02 DIAGNOSIS — Z7189 Other specified counseling: Secondary | ICD-10-CM

## 2024-05-02 DIAGNOSIS — L820 Inflamed seborrheic keratosis: Secondary | ICD-10-CM

## 2024-05-02 DIAGNOSIS — Z1283 Encounter for screening for malignant neoplasm of skin: Secondary | ICD-10-CM

## 2024-05-02 MED ORDER — MUPIROCIN 2 % EX OINT: 1.0000 | TOPICAL_OINTMENT | Freq: Two times a day (BID) | CUTANEOUS | 1 refills | Status: DC

## 2024-05-02 NOTE — Patient Instructions (Addendum)
 Apply mupirocin ointment to wound at left temple daily and cover with bandage until healed.    Seborrheic Keratosis  What causes seborrheic keratoses? Seborrheic keratoses are harmless, common skin growths that first appear during adult life.  As time goes by, more growths appear.  Some people may develop a large number of them.  Seborrheic keratoses appear on both covered and uncovered body parts.  They are not caused by sunlight.  The tendency to develop seborrheic keratoses can be inherited.  They vary in color from skin-colored to gray, brown, or even black.  They can be either smooth or have a rough, warty surface.   Seborrheic keratoses are superficial and look as if they were stuck on the skin.  Under the microscope this type of keratosis looks like layers upon layers of skin.  That is why at times the top layer may seem to fall off, but the rest of the growth remains and re-grows.    Treatment Seborrheic keratoses do not need to be treated, but can easily be removed in the office.  Seborrheic keratoses often cause symptoms when they rub on clothing or jewelry.  Lesions can be in the way of shaving.  If they become inflamed, they can cause itching, soreness, or burning.  Removal of a seborrheic keratosis can be accomplished by freezing, burning, or surgery. If any spot bleeds, scabs, or grows rapidly, please return to have it checked, as these can be an indication of a skin cancer.   Cryotherapy Aftercare  Wash gently with soap and water everyday.   Apply Vaseline and Band-Aid daily until healed.     Melanoma ABCDEs  Melanoma is the most dangerous type of skin cancer, and is the leading cause of death from skin disease.  You are more likely to develop melanoma if you: Have light-colored skin, light-colored eyes, or red or blond hair Spend a lot of time in the sun Tan regularly, either outdoors or in a tanning bed Have had blistering sunburns, especially during childhood Have a  close family member who has had a melanoma Have atypical moles or large birthmarks  Early detection of melanoma is key since treatment is typically straightforward and cure rates are extremely high if we catch it early.   The first sign of melanoma is often a change in a mole or a new dark spot.  The ABCDE system is a way of remembering the signs of melanoma.  A for asymmetry:  The two halves do not match. B for border:  The edges of the growth are irregular. C for color:  A mixture of colors are present instead of an even brown color. D for diameter:  Melanomas are usually (but not always) greater than 6mm - the size of a pencil eraser. E for evolution:  The spot keeps changing in size, shape, and color.  Please check your skin once per month between visits. You can use a small mirror in front and a large mirror behind you to keep an eye on the back side or your body.   If you see any new or changing lesions before your next follow-up, please call to schedule a visit.  Please continue daily skin protection including broad spectrum sunscreen SPF 30+ to sun-exposed areas, reapplying every 2 hours as needed when you're outdoors.   Staying in the shade or wearing long sleeves, sun glasses (UVA+UVB protection) and wide brim hats (4-inch brim around the entire circumference of the hat) are also recommended for sun  protection.    Due to recent changes in healthcare laws, you may see results of your pathology and/or laboratory studies on MyChart before the doctors have had a chance to review them. We understand that in some cases there may be results that are confusing or concerning to you. Please understand that not all results are received at the same time and often the doctors may need to interpret multiple results in order to provide you with the best plan of care or course of treatment. Therefore, we ask that you please give us  2 business days to thoroughly review all your results before  contacting the office for clarification. Should we see a critical lab result, you will be contacted sooner.   If You Need Anything After Your Visit  If you have any questions or concerns for your doctor, please call our main line at [phone] and press option 4 to reach your doctor's medical assistant. If no one answers, please leave a voicemail as directed and we will return your call as soon as possible. Messages left after 4 pm will be answered the following business day.   You may also send us  a message via MyChart. We typically respond to MyChart messages within 1-2 business days.  For prescription refills, please ask your pharmacy to contact our office. Our fax number is [phone].  If you have an urgent issue when the clinic is closed that cannot wait until the next business day, you can page your doctor at the number below.    Please note that while we do our best to be available for urgent issues outside of office hours, we are not available 24/7.   If you have an urgent issue and are unable to reach us , you may choose to seek medical care at your doctor's office, retail clinic, urgent care center, or emergency room.  If you have a medical emergency, please immediately call 911 or go to the emergency department.  Pager Numbers  - Dr. Bary Likes: [phone]  - Dr. Annette Barters: [phone]  - Dr. Felipe Horton: [phone]   In the event of inclement weather, please call our main line at [phone] for an update on the status of any delays or closures.  Dermatology Medication Tips: Please keep the boxes that topical medications come in in order to help keep track of the instructions about where and how to use these. Pharmacies typically print the medication instructions only on the boxes and not directly on the medication tubes.   If your medication is too expensive, please contact our office at [phone] option 4 or send us  a message through MyChart.   We are unable to tell  what your co-pay for medications will be in advance as this is different depending on your insurance coverage. However, we may be able to find a substitute medication at lower cost or fill out paperwork to get insurance to cover a needed medication.   If a prior authorization is required to get your medication covered by your insurance company, please allow us  1-2 business days to complete this process.  Drug prices often vary depending on where the prescription is filled and some pharmacies may offer cheaper prices.  The website www.goodrx.com contains coupons for medications through different pharmacies. The prices here do not account for what the cost may be with help from insurance (it may be cheaper with your insurance), but the website can give you the price if you did not use any insurance.  - You can print the  associated coupon and take it with your prescription to the pharmacy.  - You may also stop by our office during regular business hours and pick up a GoodRx coupon card.  - If you need your prescription sent electronically to a different pharmacy, notify our office through [facility] or by phone at [phone] option 4.     Si Usted Necesita Algo Despus de Su Visita  Tambin puede enviarnos un mensaje a travs de Clinical cytogeneticist. Por lo general respondemos a los mensajes de MyChart en el transcurso de 1 a 2 das hbiles.  Para renovar recetas, por favor pida a su farmacia que se ponga en contacto con nuestra oficina. Franz Jacks de fax es [place] [phone].  Si tiene un asunto urgente cuando la clnica est cerrada y que no puede esperar hasta el siguiente da hbil, puede llamar/localizar a su doctor(a) al nmero que aparece a continuacin.   Por favor, tenga en cuenta que aunque hacemos todo lo posible para estar disponibles para asuntos urgentes fuera del horario de [place], no estamos disponibles las 24 horas del da, los 7 [address]  [address] de la [place].   Si tiene un problema  urgente y no puede comunicarse con nosotros, puede optar por buscar atencin mdica  en el consultorio de su doctor(a), en una clnica privada, en un centro de atencin urgente o en una sala de emergencias.  Si tiene Engineer, drilling, por favor llame inmediatamente al 911 o vaya a la sala de emergencias.  Nmeros de bper  - Dr. Bary Likes: [phone]  - Dra. Annette Barters: [phone]  - Dr. Felipe Horton: [phone]   En caso de inclemencias del tiempo, por favor llame a Lajuan Pila principal al [phone] para una actualizacin sobre el [place] de cualquier retraso o cierre.  Consejos para la medicacin en dermatologa: Por favor, guarde las cajas en las que vienen los medicamentos de uso tpico para ayudarle a seguir las instrucciones sobre dnde y cmo usarlos. Las farmacias generalmente imprimen las instrucciones del medicamento slo en las cajas y no directamente en los tubos del [place].   Si su medicamento es muy caro, por favor, pngase en contacto con Bettyjane Brunet llamando al [phone] y presione la opcin 4 o envenos un mensaje a travs de Clinical cytogeneticist.   No podemos decirle cul ser su copago por los medicamentos por adelantado ya que esto es diferente dependiendo de la cobertura de su seguro. Sin embargo, es posible que podamos encontrar un medicamento sustituto a Audiological scientist un formulario para que el seguro cubra el medicamento que se considera necesario.   Si se requiere una autorizacin previa para que su compaa de seguros [place] su medicamento, por favor permtanos de 1 a 2 das hbiles para completar este proceso.  Los precios de los medicamentos varan con frecuencia dependiendo del Environmental consultant de dnde se surte la receta y alguna farmacias pueden ofrecer precios ms baratos.  El sitio web www.goodrx.com tiene cupones para medicamentos de Health and safety inspector. Los precios aqu no tienen en cuenta lo que podra costar con la ayuda del seguro (puede ser ms barato  con su seguro), pero el sitio web puede darle el precio si no utiliz Tourist information centre manager.  - Puede imprimir el cupn correspondiente y llevarlo con su receta a la farmacia.  - Tambin puede pasar por nuestra oficina durante el horario de atencin regular y Education officer, museum una tarjeta de cupones de GoodRx.  - Si necesita que su receta se enve electrnicamente a Psychiatrist, informe a nuestra oficina  a travs de MyChart de [place] o por telfono llamando al [phone] y presione la opcin 4.

## 2024-05-02 NOTE — Progress Notes (Unsigned)
 Follow-Up Visit   Subjective  [name] [name] is a 88 y.o. female who presents for the following: Skin Cancer Screening and Full Body Skin Exam Hx of isks, hx of aks, hx of scc,   Husband is with patient and contributes to patient history. Reports that had recent fall over a week ago and hurt left arm and back.  Patient was seen by Dr. Annette Barters on 04/16/2024 for growth at left temple hairline and bx  and ED&C was preformed at visit.  Bx was proven two types of skin cancer Sccis and Neuroendocrine Carcinoma.   May need additional surgery with wider margins performed by a specialist may be necessary.   The patient presents for Total-Body Skin Exam (TBSE) for skin cancer screening and mole check. The patient has spots, moles and lesions to be evaluated, some may be new or changing and the patient may have concern these could be cancer.  The following portions of the chart were reviewed this encounter and updated as appropriate: medications, allergies, medical history  Review of Systems:  No other skin or systemic complaints except as noted in HPI or Assessment and Plan.       Objective  Well appearing patient in no apparent distress; mood and affect are within normal limits.  A full examination was performed including scalp, head, eyes, ears, nose, lips, neck, chest, axillae, abdomen, back, buttocks, bilateral upper extremities, bilateral lower extremities, hands, feet, fingers, toes, fingernails, and toenails. All findings within normal limits unless otherwise noted below.   Relevant physical exam findings are noted in the Assessment and Plan.  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 Patient instructed to use Mupirocin 2 % ointment twice daily to wound and cover with bandage until healed. Rx sent to [facility].   - Recommend referral to Dr. Dorna Gasman at [facility] for further evaluation and treatment.  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 Sections show epidermal hyperplasia with full thickness keratinocytic atypia consistent with squamous cell carcinoma in situ. Centrally there is ulceration and subjacent to this area are nests and cords of atypical neuroendocrine cells. These cells do not resemble the in situ carcinoma but may have derived from it , although I cannot be completely certain as this appears more likely to be a collision tumor. The neuroendocrine cells are positive with Ber-EP4, positive with pancytokeratin AE1/AE3 in a perinuclear dot pattern focally, negative with EMA in the majority of the lesion , although there is focal staining in some of the nests, show a dot positivity pattern with CK20, but are also unusually positive with TTF-1. In general primary cutaneous Merkel cell carcinoma does not exhibit positivity with TTF-1 and thus I cannot exclude the possibility of a metastatic small cell carcinoma or neuroendocrine carcinoma. This is a highly aggressive malignant lesion and does extend to the peripheral and deep margins. Dr. Custer Downs has a similar opinion. (NDS:kh 04/26/24) CRTLVALUE: Report called to  Mylinda Asa, read back and faxed. (sh 04/26/24 12:05:45 PM) Vida Gravely MD Dermatopathologist, Electronic Signature (Case signed 04/26/2024)  SKIN CANCER SCREENING PERFORMED TODAY.  ACTINIC DAMAGE - Chronic condition,  secondary to cumulative UV/sun exposure - diffuse scaly erythematous macules with underlying dyspigmentation - Recommend daily broad spectrum sunscreen SPF 30+ to sun-exposed areas, reapply every 2 hours as needed.  - Staying in the shade or wearing long sleeves, sun glasses (UVA+UVB protection) and wide brim hats (4-inch brim around the entire circumference of the hat) are also recommended for sun protection.  - Call for new or changing lesions.  LENTIGINES, SEBORRHEIC KERATOSES, HEMANGIOMAS - Benign normal skin lesions - Benign-appearing - Call for any changes  MELANOCYTIC NEVI - Tan-brown and/or pink-flesh-colored symmetric macules and papules - Benign appearing on exam today - Observation - Call clinic for new or changing moles - Recommend daily use of broad spectrum spf 30+ sunscreen to sun-exposed areas.  Purpura - Chronic; persistent and recurrent.  Treatable, but not curable. - Violaceous macules and patches - Benign - Related to trauma, age, sun damage and/or use of blood thinners, chronic use of topical and/or oral steroids - Observe - Can use OTC arnica containing moisturizer such as Dermend Bruise Formula if desired - Call for worsening or other concerns  STASIS DERMATITIS With Schamberg's Purpura  Exam: Erythematous, scaly patches involving the ankle and distal lower leg with associated lower leg edema. Chronic and persistent condition with duration or expected duration over one year. Condition is symptomatic / bothersome to patient. Not to goal. Stasis in the legs causes chronic leg swelling, which may result in itchy or painful rashes, skin discoloration, skin texture changes, and sometimes ulceration.  Recommend daily graduated compression hose/stockings- easiest to put on first thing in morning, remove at bedtime.  Elevate legs as much as possible. Avoid salt/sodium rich foods. Treatment Plan: No recommended treatment   HISTORY OF RECENT FALL  Exam:  bruising on left  shoulder, left arm and back  Treatment Plan: Benign. Observe  - history of fall around May 7 or 8th   HISTORY OF SQUAMOUS CELL CARCINOMA OF THE SKIN 11/10/2020 - left distal posterior deltoid ED&C 01/15/2021 - No evidence of recurrence today - No lymphadenopathy - Recommend regular full body skin exams - Recommend daily broad spectrum sunscreen SPF 30+ to sun-exposed areas, reapply every 2 hours as needed.  - Call if any new or changing lesions are noted between office visits  NEUROENDOCRINE CARCINOMA (HCC)   Related Procedures Ambulatory referral to Surgical Oncology NEOPLASM OF UNCERTAIN BEHAVIOR OF SKIN   Related Medications mupirocin ointment (BACTROBAN) 2 % Apply 1 Application topically 2 (two) times daily. Apply to wound and cover with bandage until healed. INFLAMED SEBORRHEIC KERATOSIS (2) left mid back paraspinal x 1, left low back x 1 (2) Symptomatic, irritating, patient would like treated. Destruction of lesion - left mid back paraspinal x 1, left low back x 1 (2) Complexity: simple   Destruction method: cryotherapy   Informed consent: discussed and consent obtained   Timeout:  patient name, date of birth, surgical site, and procedure verified Lesion destroyed using liquid nitrogen: Yes   Region frozen until ice ball extended beyond lesion: Yes   Outcome: patient tolerated procedure well with no complications   Post-procedure details: wound care instructions given   SQUAMOUS CELL CARCINOMA IN SITU   Related Procedures Ambulatory referral to Surgical Oncology SKIN CANCER SCREENING   ACTINIC SKIN DAMAGE   LENTIGO   MELANOCYTIC NEVUS, UNSPECIFIED LOCATION  NEUROENDOCRINE TUMOR   COUNSELING AND COORDINATION OF CARE   Return for jan / feb tbse .  IRandee Busing, CMA, am acting as scribe for Celine Collard, MD.   Documentation: I have reviewed the above documentation for accuracy and completeness, and I agree with the above.  Celine Collard,  MD

## 2024-05-03 ENCOUNTER — Encounter: Payer: Self-pay | Admitting: Dermatology

## 2024-05-03 ENCOUNTER — Ambulatory Visit: Payer: Self-pay

## 2024-05-09 ENCOUNTER — Encounter: Payer: Self-pay | Admitting: Internal Medicine

## 2024-05-09 ENCOUNTER — Ambulatory Visit (INDEPENDENT_AMBULATORY_CARE_PROVIDER_SITE_OTHER): Admitting: Internal Medicine

## 2024-05-09 VITALS — BP 134/80 | HR 80 | Ht 65.0 in | Wt 140.6 lb

## 2024-05-09 DIAGNOSIS — Z7901 Long term (current) use of anticoagulants: Secondary | ICD-10-CM | POA: Diagnosis not present

## 2024-05-09 DIAGNOSIS — E559 Vitamin D deficiency, unspecified: Secondary | ICD-10-CM

## 2024-05-09 DIAGNOSIS — C7A8 Other malignant neuroendocrine tumors: Secondary | ICD-10-CM | POA: Diagnosis not present

## 2024-05-09 DIAGNOSIS — I495 Sick sinus syndrome: Secondary | ICD-10-CM | POA: Diagnosis not present

## 2024-05-09 DIAGNOSIS — Z9181 History of falling: Secondary | ICD-10-CM | POA: Diagnosis not present

## 2024-05-09 DIAGNOSIS — F03B4 Unspecified dementia, moderate, with anxiety: Secondary | ICD-10-CM

## 2024-05-09 DIAGNOSIS — E871 Hypo-osmolality and hyponatremia: Secondary | ICD-10-CM

## 2024-05-09 DIAGNOSIS — D485 Neoplasm of uncertain behavior of skin: Secondary | ICD-10-CM | POA: Diagnosis not present

## 2024-05-09 MED ORDER — PANTOPRAZOLE SODIUM 40 MG PO TBEC
40.0000 mg | DELAYED_RELEASE_TABLET | Freq: Every day | ORAL | 3 refills | Status: DC
Start: 2024-05-09 — End: 2024-10-24

## 2024-05-09 NOTE — Progress Notes (Unsigned)
 Subjective:  Patient ID: [name] [name], female    DOB: 1936/05/03  Age: 88 y.o. MRN: [id number]  CC: There were no encounter diagnoses.   HPI [name] [name] presents for  Chief Complaint  Patient presents with  . Hospitalization Follow-up   ER Follow up  Patient fell  while cleaning some cement steps , as she stood up she lost her balance and fell backwards onto cement steps on May 7  left elbow injured ,  skin tear.  Treated in ER after head amd c spine CT rued out acute injury .     2) GERD:  omeprazole  stopped in January due to polypharmacy, h/o B12 deficiency.  Advised to take OTC omeprazole  by neuroolgy and f/u with me  3) Dementia:  severe cognitive decline.   SLUM 7/30.  No longer cooks..  husband requesting "help"   4) Dermatologist Bary Likes has ordered a referral to [facility]  "scan"  after removing a lesion on hher left forehead primary cutaneous neuroendocrine  carcinoma       Outpatient Medications Prior to Visit  Medication Sig Dispense Refill  . acetaminophen  (TYLENOL ) 500 MG tablet Take 1,000 mg by mouth every 6 (six) hours as needed (pain).     . amiodarone  (PACERONE ) 200 MG tablet Take 1 tablet (200 mg) by mouth twice daily for 4 weeks, then decrease to 1 tablet (200 mg) by mouth daily 180 tablet 2  . amLODipine  (NORVASC ) 2.5 MG tablet Take 2.5 mg by mouth daily as needed.    . amoxicillin  (AMOXIL ) 500 MG capsule Take by mouth.    . apixaban  (ELIQUIS ) 2.5 MG TABS tablet Take 1 tablet (2.5 mg total) by mouth 2 (two) times daily. 180 tablet 1  . Carboxymethylcellul-Glycerin (LUBRICATING EYE DROPS OP) Place 1 drop into both eyes daily as needed (dry eyes).    . cholecalciferol  (VITAMIN D ) 1000 units tablet Take 1,000 Units by mouth daily.    . cyanocobalamin  (VITAMIN B12) 500 MCG tablet Take 500 mcg by mouth daily.    . donepezil  (ARICEPT  ODT) 10 MG disintegrating tablet Take 1 tablet by mouth at bedtime.    . furosemide  (LASIX ) 20 MG tablet Take 1 tablet (20 mg  total) by mouth as needed for fluid or edema. 30 tablet 1  . metoprolol  succinate (TOPROL -XL) 25 MG 24 hr tablet Take 1 tablet (25 mg total) by mouth 2 (two) times daily. 180 tablet 2  . mupirocin  ointment (BACTROBAN ) 2 % Apply 1 Application topically 2 (two) times daily. Apply to wound and cover with bandage until healed. 30 g 1  . pravastatin  (PRAVACHOL ) 20 MG tablet TAKE 1 TABLET BY MOUTH DAILY 90 tablet 2  . sodium chloride  (OCEAN) 0.65 % SOLN nasal spray Place 1 spray into both nostrils daily as needed for congestion.    Aaron Aas aluminum-magnesium  hydroxide 200-200 MG/5ML suspension Take 20 mLs by mouth every 6 (six) hours as needed for indigestion.  (Patient not taking: Reported on 04/25/2024)     No facility-administered medications prior to visit.    Review of Systems;  Patient denies headache, fevers, malaise, unintentional weight loss, skin rash, eye pain, sinus congestion and sinus pain, sore throat, dysphagia,  hemoptysis , cough, dyspnea, wheezing, chest pain, palpitations, orthopnea, edema, abdominal pain, nausea, melena, diarrhea, constipation, flank pain, dysuria, hematuria, urinary  Frequency, nocturia, numbness, tingling, seizures,  Focal weakness, Loss of consciousness,  Tremor, insomnia, depression, anxiety, and suicidal ideation.      Objective:  BP 134/80  Pulse 80   Ht 5\' 5"  (1.651 m)   Wt 140 lb 9.6 oz (63.8 kg)   SpO2 97%   BMI 23.40 kg/m   BP Readings from Last 3 Encounters:  05/09/24 134/80  04/25/24 (!) 156/80  03/22/24 (!) 156/79    Wt Readings from Last 3 Encounters:  05/09/24 140 lb 9.6 oz (63.8 kg)  04/25/24 140 lb (63.5 kg)  03/22/24 144 lb 9.6 oz (65.6 kg)    Physical Exam  Lab Results  Component Value Date   HGBA1C 5.9 02/29/2024   HGBA1C 6.5 12/29/2022   HGBA1C 6.5 05/26/2022    Lab Results  Component Value Date   CREATININE 0.83 02/29/2024   CREATININE 0.78 02/09/2024   CREATININE 1.03 (H) 09/22/2023    Lab Results  Component Value  Date   WBC 6.1 02/29/2024   HGB 12.9 02/29/2024   HCT 39.1 02/29/2024   PLT 283.0 02/29/2024   GLUCOSE 92 02/29/2024   CHOL 118 02/29/2024   TRIG 43.0 02/29/2024   HDL 64.10 02/29/2024   LDLDIRECT 43.0 02/29/2024   LDLCALC 45 02/29/2024   ALT 22 02/29/2024   AST 27 02/29/2024   NA 135 02/29/2024   K 4.5 02/29/2024   CL 101 02/29/2024   CREATININE 0.83 02/29/2024   BUN 17 02/29/2024   CO2 27 02/29/2024   TSH 1.000 02/09/2024   INR 1.3 02/12/2019   HGBA1C 5.9 02/29/2024   MICROALBUR <0.7 04/29/2023    CT Head Wo Contrast Result Date: 04/25/2024 CLINICAL DATA:  Trauma, fall EXAM: CT HEAD WITHOUT CONTRAST CT CERVICAL SPINE WITHOUT CONTRAST TECHNIQUE: Multidetector CT imaging of the head and cervical spine was performed following the standard protocol without intravenous contrast. Multiplanar CT image reconstructions of the cervical spine were also generated. RADIATION DOSE REDUCTION: This exam was performed according to the departmental dose-optimization program which includes automated exposure control, adjustment of the mA and/or kV according to patient size and/or use of iterative reconstruction technique. COMPARISON:  03/24/2020 FINDINGS: CT HEAD FINDINGS Brain: No evidence of acute infarction, hemorrhage, hydrocephalus, extra-axial collection or mass lesion/mass effect. Periventricular white hypodensity. Vascular: No hyperdense vessel or unexpected calcification. Skull: Normal. Negative for fracture or focal lesion. Sinuses/Orbits: No acute finding. Other: None. CT CERVICAL SPINE FINDINGS Alignment: Degenerative straightening of the normal cervical lordosis. Skull base and vertebrae: No acute fracture. No primary bone lesion or focal pathologic process. Soft tissues and spinal canal: No prevertebral fluid or swelling. No visible canal hematoma. Disc levels: Moderate to severe multilevel disc degenerative disease throughout the cervical spine. Upper chest: Negative. Other: None. IMPRESSION:  1. No acute intracranial pathology. Small-vessel white matter disease. 2. No fracture or static subluxation of the cervical spine. 3. Moderate to severe multilevel disc degenerative disease throughout the cervical spine. Electronically Signed   By: Fredricka Jenny M.D.   On: 04/25/2024 18:45   CT Cervical Spine Wo Contrast Result Date: 04/25/2024 CLINICAL DATA:  Trauma, fall EXAM: CT HEAD WITHOUT CONTRAST CT CERVICAL SPINE WITHOUT CONTRAST TECHNIQUE: Multidetector CT imaging of the head and cervical spine was performed following the standard protocol without intravenous contrast. Multiplanar CT image reconstructions of the cervical spine were also generated. RADIATION DOSE REDUCTION: This exam was performed according to the departmental dose-optimization program which includes automated exposure control, adjustment of the mA and/or kV according to patient size and/or use of iterative reconstruction technique. COMPARISON:  03/24/2020 FINDINGS: CT HEAD FINDINGS Brain: No evidence of acute infarction, hemorrhage, hydrocephalus, extra-axial collection or mass lesion/mass effect. Periventricular  white hypodensity. Vascular: No hyperdense vessel or unexpected calcification. Skull: Normal. Negative for fracture or focal lesion. Sinuses/Orbits: No acute finding. Other: None. CT CERVICAL SPINE FINDINGS Alignment: Degenerative straightening of the normal cervical lordosis. Skull base and vertebrae: No acute fracture. No primary bone lesion or focal pathologic process. Soft tissues and spinal canal: No prevertebral fluid or swelling. No visible canal hematoma. Disc levels: Moderate to severe multilevel disc degenerative disease throughout the cervical spine. Upper chest: Negative. Other: None. IMPRESSION: 1. No acute intracranial pathology. Small-vessel white matter disease. 2. No fracture or static subluxation of the cervical spine. 3. Moderate to severe multilevel disc degenerative disease throughout the cervical spine.  Electronically Signed   By: Fredricka Jenny M.D.   On: 04/25/2024 18:45   DG Forearm Left Result Date: 04/25/2024 CLINICAL DATA:  Fall and trauma to the left upper extremity. EXAM: LEFT FOREARM - 2 VIEW COMPARISON:  Left wrist radiograph dated 11/30/2014. FINDINGS: No acute fracture or dislocation. The bones are osteopenic. Laceration of the skin and soft tissues of the proximal forearm with subcutaneous hematoma. IMPRESSION: 1. No acute fracture or dislocation. 2. Laceration of the proximal forearm with subcutaneous hematoma. Electronically Signed   By: Angus Bark M.D.   On: 04/25/2024 18:26    Assessment & Plan:  .There are no diagnoses linked to this encounter.   I spent 34 minutes on the day of this face to face encounter reviewing patient's  most recent visit with cardiology,  nephrology,  and neurology,  prior relevant surgical and non surgical procedures, recent  labs and imaging studies, counseling on weight management,  reviewing the assessment and plan with patient, and post visit ordering and reviewing of  diagnostics and therapeutics with patient  .   Follow-up: No follow-ups on file.   Thersia Flax, MD

## 2024-05-09 NOTE — Patient Instructions (Addendum)
 [name] [name]'s wounds are healing well \\They  no longer need antibacterial ointment.   Keep them  covered until resolved   Social work referral has been made to provide resources to help at home   I have refilled the pantoprazole .  Please take a b12 supplement every day if you are not already doing so

## 2024-05-14 DIAGNOSIS — C4A9 Merkel cell carcinoma, unspecified: Secondary | ICD-10-CM | POA: Insufficient documentation

## 2024-05-14 DIAGNOSIS — C7A8 Other malignant neuroendocrine tumors: Secondary | ICD-10-CM | POA: Insufficient documentation

## 2024-05-14 NOTE — Assessment & Plan Note (Signed)
 Biopsy of left temple lesion  by [facility],  has be referred to [facility] oncology

## 2024-05-14 NOTE — Assessment & Plan Note (Signed)
S/p pacemaker implantation

## 2024-05-14 NOTE — Assessment & Plan Note (Signed)
 She had a large hematoma after her first fall,   and now has had a seconda fall at home.  Will recommend d/c eliquis .

## 2024-05-14 NOTE — Assessment & Plan Note (Signed)
 Chronic.  Encouarge liberalization of salt in diet with Gatorade Lab Results  Component Value Date   NA 135 02/29/2024   K 4.5 02/29/2024   CL 101 02/29/2024   CO2 27 02/29/2024

## 2024-05-14 NOTE — Assessment & Plan Note (Signed)
 [name] [name] has had 2 falls in the last 3 months. Thankfully she was evaluated immediately in the ER with head CT.  We Will need to consider discontiinuing  Eliquis  if she is continue to fall

## 2024-05-15 ENCOUNTER — Ambulatory Visit: Payer: Medicare Other | Admitting: Podiatry

## 2024-05-15 VITALS — Ht 65.0 in | Wt 140.6 lb

## 2024-05-15 DIAGNOSIS — B351 Tinea unguium: Secondary | ICD-10-CM

## 2024-05-15 DIAGNOSIS — M79675 Pain in left toe(s): Secondary | ICD-10-CM

## 2024-05-15 DIAGNOSIS — M79674 Pain in right toe(s): Secondary | ICD-10-CM | POA: Diagnosis not present

## 2024-05-15 NOTE — Progress Notes (Signed)
 Chief Complaint  Patient presents with   Nail Problem    Pt is here for [facility].    SUBJECTIVE Patient presents to office today complaining of elongated, thickened nails that cause pain while ambulating in shoes.  Patient is unable to trim their own nails. Patient is here for further evaluation and treatment.  Past Medical History:  Diagnosis Date   Actinic keratosis    Arthritis    Atrial flutter (HCC) 02/2011   s/p cardioversion    Chest pain    a. H/o cardiac cath x 2-> 2012 -->nl cors;  b. 12/2016 MV: EF 61%, small region of mild perfusion defect in the apical anteroseptal region c/w breast attenuation, no ischemia-->Low risk; c. 06/2019 MV: Mod size, mild inflat ischemia, EF 59%. Cor and Ao Ca2+. Inflat defect more pronounced on this study compared to last; c. 07/2019 Cath: LM nl, LAD min irregs, LCX nl, OM1/2/3 nl, RCA min irregs.   CKD (chronic kidney disease), stage III (HCC)    Concussion with no loss of consciousness 09/01/2016   Cystocele    Degenerative disorder of bone    GERD (gastroesophageal reflux disease)    Headache(784.0)    chronic   Hiatal hernia    Hyperlipidemia    Hypertension    Influenza 01/10/2017   Knee fracture    Migraines    Mobitz type 2 second degree atrioventricular block    a. felt to be 2/2 amiodarone , resolved with decreased amiodarone  dose.  Amio since d/c'd.   Permanent atrial fibrillation (HCC)    a. status post multiple DCCVs; b. 2018 - eval for PVI but opted for rate control.   PONV (postoperative nausea and vomiting)    oxycodone and codiene cause N/V    Pre-syncope    a. In setting of dehydration and AKI in the past.   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 Archer Kobs   Squamous cell carcinoma of skin 11/10/2020   left distal posterior deltoid (EDC 01/15/2021)   Tachycardia induced cardiomyopathy (HCC)    a. Resolved;  b.  08/2017 Echo: EF 50-55%, no rwma, mild MR, mildly to mod dil LA/RA; c. 02/2018 Echo: EF 55-60%, mild MR. Mildly dil LA. Nl RVSP. PASP .   Venous insufficiency    Vertigo     Allergies  Allergen Reactions   Cyclobenzaprine Other (See Comments)    Dropped blood pressure and heart rate, "talking out of my mind"   Iodine  Anaphylaxis    NO PROBLEMS WITH BETADINE    Biaxin [Clarithromycin] Nausea And Vomiting   Codeine Nausea And Vomiting   Digoxin  And Related Other (See Comments)    Fatigue, eye puffiness, hoarsness   Diltiazem  Other (See Comments)   Enalapril Other (See Comments)    unknown   Famotidine      Caused dizziness   Fluocinonide Other (See Comments)    Tingling sensation in head and redness to scalp.   Iodinated Contrast Media Other (See Comments)    Tachycardia   Iodine -131    Losartan  Potassium Other (See Comments)    Dizzy and headache   Omnicef [Cefdinir]    Oxycodone Nausea And Vomiting   Promethazine  Other (See Comments)    Unknown    Sertraline      Dizziness and weakness   Tizanidine Hcl Other (See Comments)    hypotension    Warfarin And Related  Bleeding    Xarelto  [Rivaroxaban ] Other (See Comments)    bleeding     OBJECTIVE General Patient is awake, alert, and oriented x 3 and in no acute distress. Derm Skin is dry and supple bilateral. Negative open lesions or macerations. Remaining integument unremarkable. Nails are tender, long, thickened and dystrophic with subungual debris, consistent with onychomycosis, 1-5 bilateral. No signs of infection noted. Vasc  DP and PT pedal pulses palpable bilaterally. Temperature gradient within normal limits.  Neuro Epicritic and protective threshold sensation grossly intact bilaterally.  Musculoskeletal Exam No symptomatic pedal deformities noted bilateral. Muscular strength within normal limits.  ASSESSMENT 1.  Pain due to onychomycosis of toenails both  PLAN OF CARE 1. Patient evaluated today.  2.  Instructed to maintain good pedal hygiene and foot care.  3. Mechanical debridement of nails 1-5 bilaterally performed using a nail nipper. Filed with dremel without incident.  4. Return to clinic in 3 mos.    Dot Gazella, DPM [facility]  Dr. Dot Gazella, DPM    [address] [place], [place] [address]                Office [phone]  Fax [phone]

## 2024-05-16 DIAGNOSIS — C4A9 Merkel cell carcinoma, unspecified: Secondary | ICD-10-CM | POA: Diagnosis not present

## 2024-05-16 DIAGNOSIS — D0439 Carcinoma in situ of skin of other parts of face: Secondary | ICD-10-CM | POA: Diagnosis not present

## 2024-05-18 ENCOUNTER — Encounter: Payer: Self-pay | Admitting: *Deleted

## 2024-05-18 ENCOUNTER — Telehealth: Payer: Self-pay | Admitting: *Deleted

## 2024-05-22 DIAGNOSIS — N183 Chronic kidney disease, stage 3 unspecified: Secondary | ICD-10-CM | POA: Diagnosis not present

## 2024-05-22 DIAGNOSIS — E785 Hyperlipidemia, unspecified: Secondary | ICD-10-CM | POA: Diagnosis not present

## 2024-05-22 DIAGNOSIS — I129 Hypertensive chronic kidney disease with stage 1 through stage 4 chronic kidney disease, or unspecified chronic kidney disease: Secondary | ICD-10-CM | POA: Diagnosis not present

## 2024-05-22 DIAGNOSIS — E1122 Type 2 diabetes mellitus with diabetic chronic kidney disease: Secondary | ICD-10-CM | POA: Diagnosis not present

## 2024-05-22 DIAGNOSIS — C4A4 Merkel cell carcinoma of scalp and neck: Secondary | ICD-10-CM | POA: Diagnosis not present

## 2024-05-22 DIAGNOSIS — C4A9 Merkel cell carcinoma, unspecified: Secondary | ICD-10-CM | POA: Diagnosis not present

## 2024-05-22 DIAGNOSIS — Z950 Presence of cardiac pacemaker: Secondary | ICD-10-CM | POA: Diagnosis not present

## 2024-05-22 NOTE — Progress Notes (Signed)
 Remote pacemaker transmission.

## 2024-05-25 ENCOUNTER — Telehealth: Payer: Self-pay

## 2024-05-25 NOTE — Progress Notes (Signed)
 Complex Care Management Note Care Guide Note  05/25/2024 Name: [name] [name] MRN: [id number] DOB: [birth year]   Complex Care Management Outreach Attempts: An unsuccessful telephone outreach was attempted today to offer the patient information about available complex care management services.  Follow Up Plan:  Additional outreach attempts will be made to offer the patient complex care management information and services.   Encounter Outcome:  No Answer  [facility]  [facility], [facility] Care Management Assistant Direct Dial: [phone]  Fax: [phone]

## 2024-05-28 NOTE — Progress Notes (Signed)
 Complex Care Management Care Guide Note  05/28/2024 Name: [name] [name] MRN: [id number] DOB: 1936-08-17  [name] [name] is a 88 y.o. year old female who is a primary care patient of Madelon Scheuermann, Ray Caffey, MD and is actively engaged with the care management team. I reached out to Lynford Sarin by phone today to assist with re-scheduling  with the Licensed Clinical Child psychotherapist.  Follow up plan: Spouse Mr. Valenza declined to reschedule with LCSW at this time. He stated now is too overwhelming with patients cancer diagnosis. Mr. Crayton shared he has contact information for LCSW and follow up as needed.  [facility]  [facility], [facility] Care Management Assistant Direct Dial: [phone]  Fax: [phone]

## 2024-05-29 ENCOUNTER — Emergency Department

## 2024-05-29 ENCOUNTER — Emergency Department
Admission: EM | Admit: 2024-05-29 | Discharge: 2024-05-30 | Disposition: A | Discharge status: Home / Self Care | Attending: Emergency Medicine | Admitting: Emergency Medicine

## 2024-05-29 ENCOUNTER — Other Ambulatory Visit: Payer: Self-pay

## 2024-05-29 DIAGNOSIS — S0990XA Unspecified injury of head, initial encounter: Secondary | ICD-10-CM | POA: Diagnosis not present

## 2024-05-29 DIAGNOSIS — I6201 Nontraumatic acute subdural hemorrhage: Secondary | ICD-10-CM | POA: Diagnosis not present

## 2024-05-29 DIAGNOSIS — S065X0A Traumatic subdural hemorrhage without loss of consciousness, initial encounter: Secondary | ICD-10-CM | POA: Diagnosis not present

## 2024-05-29 DIAGNOSIS — E119 Type 2 diabetes mellitus without complications: Secondary | ICD-10-CM | POA: Insufficient documentation

## 2024-05-29 DIAGNOSIS — W19XXXA Unspecified fall, initial encounter: Secondary | ICD-10-CM

## 2024-05-29 DIAGNOSIS — F0390 Unspecified dementia without behavioral disturbance: Secondary | ICD-10-CM | POA: Diagnosis not present

## 2024-05-29 DIAGNOSIS — Z7901 Long term (current) use of anticoagulants: Secondary | ICD-10-CM | POA: Insufficient documentation

## 2024-05-29 DIAGNOSIS — I6782 Cerebral ischemia: Secondary | ICD-10-CM | POA: Diagnosis not present

## 2024-05-29 DIAGNOSIS — S065XAA Traumatic subdural hemorrhage with loss of consciousness status unknown, initial encounter: Secondary | ICD-10-CM

## 2024-05-29 DIAGNOSIS — G319 Degenerative disease of nervous system, unspecified: Secondary | ICD-10-CM | POA: Diagnosis not present

## 2024-05-29 DIAGNOSIS — I509 Heart failure, unspecified: Secondary | ICD-10-CM | POA: Diagnosis not present

## 2024-05-29 DIAGNOSIS — I110 Hypertensive heart disease with heart failure: Secondary | ICD-10-CM | POA: Insufficient documentation

## 2024-05-29 DIAGNOSIS — I10 Essential (primary) hypertension: Secondary | ICD-10-CM | POA: Diagnosis not present

## 2024-05-29 DIAGNOSIS — W06XXXA Fall from bed, initial encounter: Secondary | ICD-10-CM | POA: Insufficient documentation

## 2024-05-29 LAB — CBC
HCT: 36.6 % (ref 36.0–46.0)
Hemoglobin: 11.9 g/dL — ABNORMAL LOW (ref 12.0–15.0)
MCH: 31.8 pg (ref 26.0–34.0)
MCHC: 32.5 g/dL (ref 30.0–36.0)
MCV: 97.9 fL (ref 80.0–100.0)
Platelets: 191 10*3/uL (ref 150–400)
RBC: 3.74 MIL/uL — ABNORMAL LOW (ref 3.87–5.11)
RDW: 15.2 % (ref 11.5–15.5)
WBC: 5.7 10*3/uL (ref 4.0–10.5)
nRBC: 0 % (ref 0.0–0.2)

## 2024-05-29 LAB — COMPREHENSIVE METABOLIC PANEL WITH GFR
ALT: 23 U/L (ref 0–44)
AST: 29 U/L (ref 15–41)
Albumin: 3.8 g/dL (ref 3.5–5.0)
Alkaline Phosphatase: 79 U/L (ref 38–126)
Anion gap: 10 (ref 5–15)
BUN: 21 mg/dL (ref 8–23)
CO2: 22 mmol/L (ref 22–32)
Calcium: 9.1 mg/dL (ref 8.9–10.3)
Chloride: 101 mmol/L (ref 98–111)
Creatinine, Ser: 0.93 mg/dL (ref 0.44–1.00)
GFR, Estimated: 60 mL/min — ABNORMAL LOW (ref >60)
Glucose, Bld: 129 mg/dL — ABNORMAL HIGH (ref 70–99)
Potassium: 4.1 mmol/L (ref 3.5–5.1)
Sodium: 133 mmol/L — ABNORMAL LOW (ref 135–145)
Total Bilirubin: 1.5 mg/dL — ABNORMAL HIGH (ref 0.0–1.2)
Total Protein: 6.9 g/dL (ref 6.5–8.1)

## 2024-05-29 MED ORDER — LORAZEPAM 2 MG/ML IJ SOLN
0.5000 mg | Freq: Once | INTRAMUSCULAR | Status: AC
  Administered 2024-05-29: 0.5 mg via INTRAVENOUS
  Filled 2024-05-29: qty 1

## 2024-05-29 MED ORDER — COAG FACT XA INACTIVATED-ZHZO 200 MG IV SOLR
900.0000 mg | Freq: Once | INTRAVENOUS | Status: AC
  Administered 2024-05-29: 900 mg via INTRAVENOUS
  Filled 2024-05-29: qty 90

## 2024-05-29 NOTE — ED Triage Notes (Signed)
 Pt presents to the ED via POV from home after fall this morning. Pt had a possible near syncope episode and fell and hit her head this morning. Pt does take a blood thinner and has dementia. Per husband patient may be a little more confused than normal.

## 2024-05-29 NOTE — ED Provider Notes (Signed)
 [facility] Provider Note    Event Date/Time   First MD Initiated Contact with Patient 05/29/24 1824     (approximate)   History   Chief Complaint Fall   HPI  [name] [name] is a 88 y.o. female with past medical history of hypertension, hyperlipidemia, diabetes, atrial fibrillation on Eliquis , CHF, and dementia who presents to the ED following fall.  Husband reports that patient fell when trying to get up out of bed this morning between 8 and 9 AM.  She struck her head at that time but did not lose consciousness.  Over the course of the day, she has had a gradually worsening headache and husband also reports that she has been mildly confused beyond her baseline.  Patient currently complains only of a headache, denies any neck pain or other injuries from the fall.  She denies any vision changes, speech changes, numbness, or weakness.  Her last dose of Eliquis  was this morning.     Physical Exam   Triage Vital Signs: ED Triage Vitals  Encounter Vitals Group     BP 05/29/24 1755 (!) 144/68     Systolic BP Percentile --      Diastolic BP Percentile --      Pulse Rate 05/29/24 1755 60     Resp 05/29/24 1755 18     Temp 05/29/24 1755 98.3 F (36.8 C)     Temp Source 05/29/24 1755 Oral     SpO2 05/29/24 1755 98 %     Weight 05/29/24 1757 138 lb 14.2 oz (63 kg)     Height 05/29/24 1757 5\' 5"  (1.651 m)     Head Circumference --      Peak Flow --      Pain Score --      Pain Loc --      Pain Education --      Exclude from Growth Chart --     Most recent vital signs: Vitals:   05/29/24 2230 05/29/24 2300  BP: 137/69 132/66  Pulse: 61 63  Resp: 19 16  Temp:    SpO2: 98% 96%    Constitutional: Alert and oriented. Eyes: Conjunctivae are normal. Head: Atraumatic. Neck: No midline cervical spine tenderness to palpation. Nose: No congestion/rhinnorhea. Mouth/Throat: Mucous membranes are moist.  Cardiovascular: Normal rate, regular rhythm. Grossly  normal heart sounds.  2+ radial pulses bilaterally. Respiratory: Normal respiratory effort.  No retractions. Lungs CTAB.  No chest wall tenderness to palpation. Gastrointestinal: Soft and nontender. No distention. Musculoskeletal: No lower extremity tenderness nor edema.  No upper extremity bony tenderness to palpation. Neurologic:  Normal speech and language. No gross focal neurologic deficits are appreciated.    ED Results / Procedures / Treatments   Labs (all labs ordered are listed, but only abnormal results are displayed) Labs Reviewed  COMPREHENSIVE METABOLIC PANEL WITH GFR - Abnormal; Notable for the following components:      Result Value   Sodium 133 (*)    Glucose, Bld 129 (*)    Total Bilirubin 1.5 (*)    GFR, Estimated 60 (*)    All other components within normal limits  CBC - Abnormal; Notable for the following components:   RBC 3.74 (*)    Hemoglobin 11.9 (*)    All other components within normal limits     EKG  ED ECG REPORT I, Twilla Galea, the attending physician, personally viewed and interpreted this ECG.   Date: 05/29/2024  EKG Time: 17:54  Rate: 62  Rhythm: Ventricular paced rhythm  Axis: Normal  Intervals:nonspecific intraventricular conduction delay  ST&T Change: None  RADIOLOGY CT head reviewed and interpreted by me with subdural hematoma near the right temporal lobe.  PROCEDURES:  Critical Care performed: Yes, see critical care procedure note(s)  .Critical Care  Performed by: Twilla Galea, MD Authorized by: Twilla Galea, MD   Critical care provider statement:    Critical care time (minutes):  30   Critical care time was exclusive of:  Separately billable procedures and treating other patients and teaching time   Critical care was necessary to treat or prevent imminent or life-threatening deterioration of the following conditions:  Trauma   Critical care was time spent personally by me on the following activities:  Development of  treatment plan with patient or surrogate, discussions with consultants, evaluation of patient's response to treatment, examination of patient, ordering and review of laboratory studies, ordering and review of radiographic studies, ordering and performing treatments and interventions, pulse oximetry, re-evaluation of patient's condition and review of old charts   I assumed direction of critical care for this patient from another provider in my specialty: no      MEDICATIONS ORDERED IN ED: Medications  coag fact Xa recombinant (ANDEXXA) low dose infusion 900 mg (0 mg Intravenous Stopped 05/29/24 2230)  LORazepam  (ATIVAN ) injection 0.5 mg (0.5 mg Intravenous Given 05/29/24 2151)     IMPRESSION / MDM / ASSESSMENT AND PLAN / ED COURSE  I reviewed the triage vital signs and the nursing notes.                              88 y.o. female with past medical history of hypertension, hyperlipidemia, diabetes, atrial fibrillation on Eliquis , CHF, dementia who presents to the ED following fall earlier this morning, now with increasing headache and mild confusion.  Patient's presentation is most consistent with acute presentation with potential threat to life or bodily function.  Differential diagnosis includes, but is not limited to, intracranial injury, cervical spine injury, extremity injury, anemia, electrolyte abnormality.  Patient nontoxic-appearing and in no acute distress, vital signs are unremarkable.  She is alert and oriented on my assessment with no signs of confusion, has a nonfocal neurologic exam.  CT head is concerning for 10 mm subdural hematoma with no midline shift, CT cervical spine is negative for acute process.  Findings discussed with Dr. Felipe Horton of neurosurgery, who agrees with plan for reversal of anticoagulation and repeat CT imaging in 6 hours.  No evidence of traumatic injury to her trunk or extremities.  Patient has received reversal medication for her Eliquis , remains awake and  alert with no focal neurologic deficits on exam.  She does have some confusion consistent with known dementia, repeatedly asking when she can go home.  She was given small dose of IV Ativan  to help with some agitation and anxiety, patient turned over to oncoming provider pending repeat CT imaging.      FINAL CLINICAL IMPRESSION(S) / ED DIAGNOSES   Final diagnoses:  Fall, initial encounter  Subdural hematoma (HCC)     Rx / DC Orders   ED Discharge Orders     None        Note:  This document was prepared using Dragon voice recognition software and may include unintentional dictation errors.   Twilla Galea, MD 05/29/24 [phone]

## 2024-05-29 NOTE — Discharge Instructions (Signed)
 Your CT imaging today showed a small amount of bleeding around your brain.  You should stop taking your blood thinner until cleared by neurosurgery on a follow-up visit.  Please return to the ER for any worsening headache, confusion, or other neurologic symptoms.

## 2024-05-30 ENCOUNTER — Emergency Department

## 2024-05-30 ENCOUNTER — Other Ambulatory Visit: Payer: Self-pay | Admitting: Family Medicine

## 2024-05-30 ENCOUNTER — Telehealth: Payer: Self-pay | Admitting: Neurosurgery

## 2024-05-30 DIAGNOSIS — I6201 Nontraumatic acute subdural hemorrhage: Secondary | ICD-10-CM | POA: Diagnosis not present

## 2024-05-30 DIAGNOSIS — S065XAA Traumatic subdural hemorrhage with loss of consciousness status unknown, initial encounter: Secondary | ICD-10-CM

## 2024-05-30 NOTE — Telephone Encounter (Signed)
 Carroll Clamp, MD  [facility] Clinic: [facility] Timeline: 1-2 weeks Tests to order: repeat non con head ct

## 2024-05-30 NOTE — ED Provider Notes (Signed)
-----------------------------------------   2:13 AM on 05/30/2024 -----------------------------------------   Stable repeat head CT.  Patient remains alert and oriented, no focal neurological deficits noted.  She will hold Eliquis  until seen by neurosurgery.  Strict return precautions given.  Patient and family members verbalized understanding and agree with plan of care.   Brody Bonneau J, MD 05/30/24 [phone]

## 2024-05-30 NOTE — ED Notes (Signed)
 Pt ambulatory with steady gait with minimal assist - family is comfortable with plan to discharge home and follow up.

## 2024-05-31 DIAGNOSIS — C4A30 Merkel cell carcinoma of unspecified part of face: Secondary | ICD-10-CM | POA: Diagnosis not present

## 2024-05-31 DIAGNOSIS — C4A4 Merkel cell carcinoma of scalp and neck: Secondary | ICD-10-CM | POA: Diagnosis not present

## 2024-06-06 ENCOUNTER — Ambulatory Visit
Admission: RE | Admit: 2024-06-06 | Discharge: 2024-06-06 | Disposition: A | Discharge status: Home / Self Care | Source: Ambulatory Visit | Attending: Neurosurgery | Admitting: Neurosurgery

## 2024-06-06 DIAGNOSIS — I6200 Nontraumatic subdural hemorrhage, unspecified: Secondary | ICD-10-CM | POA: Diagnosis not present

## 2024-06-06 DIAGNOSIS — R9082 White matter disease, unspecified: Secondary | ICD-10-CM | POA: Diagnosis not present

## 2024-06-06 DIAGNOSIS — S065XAA Traumatic subdural hemorrhage with loss of consciousness status unknown, initial encounter: Secondary | ICD-10-CM | POA: Diagnosis not present

## 2024-06-07 ENCOUNTER — Telehealth: Payer: Self-pay | Admitting: Cardiovascular Disease

## 2024-06-07 NOTE — Telephone Encounter (Signed)
 Spoke with both patient and her spouse. Reviewed that she should not restart Eliquis  until after she is able to see the neurosurgeon. They both verbalized understanding with no further questions at this time.

## 2024-06-07 NOTE — Progress Notes (Unsigned)
 Referring Physician:  Marylynn Verneita CROME, MD [address] [place],  [place] [address]  Primary Physician:  Marylynn Verneita CROME, MD  History of Present Illness: 06/11/2024  [name] [name] with past medical history of hypertension, hyperlipidemia, diabetes, atrial fibrillation on Eliquis , CHF, and dementia who presents for follow-up with subdural hematoma following a fall approximately 2 weeks ago.  Patient is unable to differentiate if she has new symptoms right now.  Her husband states that she occasionally complains of headache.  The symptoms are causing a significant impact on the patient's life.   Review of Systems:  A 10 point review of systems is negative, except for the pertinent positives and negatives detailed in the HPI.  Past Medical History: Past Medical History:  Diagnosis Date   Actinic keratosis    Arthritis    Atrial flutter (HCC) 02/2011   s/p cardioversion    Chest pain    a. H/o cardiac cath x 2-> 2012 -->nl cors;  b. 12/2016 MV: EF 61%, small region of mild perfusion defect in the apical anteroseptal region c/w breast attenuation, no ischemia-->Low risk; c. 06/2019 MV: Mod size, mild inflat ischemia, EF 59%. Cor and Ao Ca2+. Inflat defect more pronounced on this study compared to last; c. 07/2019 Cath: LM nl, LAD min irregs, LCX nl, OM1/2/3 nl, RCA min irregs.   CKD (chronic kidney disease), stage III (HCC)    Concussion with no loss of consciousness 09/01/2016   Cystocele    Degenerative disorder of bone    GERD (gastroesophageal reflux disease)    Headache(784.0)    chronic   Hiatal hernia    Hyperlipidemia    Hypertension    Influenza 01/10/2017   Knee fracture    Migraines    Mobitz type 2 second degree atrioventricular block    a. felt to be 2/2 amiodarone , resolved with decreased amiodarone  dose.  Amio since d/c'd.   Permanent atrial fibrillation (HCC)    a. status post multiple DCCVs; b. 2018 - eval for PVI but opted for rate control.    PONV (postoperative nausea and vomiting)    oxycodone and codiene cause N/V    Pre-syncope    a. In setting of dehydration and AKI in the past.   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 Conway   Squamous cell carcinoma of skin 11/10/2020   left distal posterior deltoid (EDC 01/15/2021)   Tachycardia induced cardiomyopathy (HCC)    a. Resolved;  b. 08/2017 Echo: EF 50-55%, no rwma, mild MR, mildly to mod dil LA/RA; c. 02/2018 Echo: EF 55-60%, mild MR. Mildly dil LA. Nl RVSP. PASP .   Venous insufficiency    Vertigo     Past Surgical History: Past Surgical History:  Procedure Laterality Date   ABDOMINAL HYSTERECTOMY  1990   APPENDECTOMY     AUGMENTATION MAMMAPLASTY Bilateral 1986   implants   AUGMENTATION MAMMAPLASTY  1990   AUGMENTATION MAMMAPLASTY  2011   CARDIAC CATHETERIZATION     CARDIOVERSION     x 3   CARDIOVERSION     CARDIOVERSION N/A 02/07/2017   Procedure: CARDIOVERSION;  Surgeon: Deatrice DELENA Cage, MD;  Location: [facility] ORS;  Service: Cardiovascular;  Laterality: N/A;   CARDIOVERSION N/A 07/22/2017   Procedure: Cardioversion;  Surgeon: Perla Evalene PARAS, MD;  Location: [facility];  Service: Cardiovascular;  Laterality: N/A;   CATARACT EXTRACTION  W/PHACO Right 09/21/2016   Procedure: CATARACT EXTRACTION PHACO AND INTRAOCULAR LENS PLACEMENT (IOC);  Surgeon: Elsie Carmine, MD;  Location: [facility];  Service: Ophthalmology;  Laterality: Right;  US  44.1 AP% 16.5 CDE 7.30 Fluid Pack Lot #[id number]   CATARACT EXTRACTION W/PHACO Left 10/19/2016   Procedure: CATARACT EXTRACTION PHACO AND INTRAOCULAR LENS PLACEMENT (IOC);  Surgeon: Elsie Carmine, MD;  Location: [facility];  Service: Ophthalmology;  Laterality: Left;  US  53.7 AP% 19.5 CDE 10.45 Fluid pack lot # [id number]   CHOLECYSTECTOMY     COMBINED AUGMENTATION MAMMAPLASTY AND ABDOMINOPLASTY     JOINT REPLACEMENT  Left 06/04/2013   left knee   KNEE ARTHROSCOPY Right 08/16/2016   Procedure: ARTHROSCOPY KNEE, tear posterior horn medial meniscus, tear anterior and posterior horns of lateral meniscus, chondromalacia of lateral compartment grade 3 patella and grade 4 medial;  Surgeon: Lynwood SHAUNNA Hue, MD;  Location: [facility];  Service: Orthopedics;  Laterality: Right;   LEFT HEART CATH AND CORONARY ANGIOGRAPHY Left 07/27/2019   Procedure: LEFT HEART CATH AND CORONARY ANGIOGRAPHY;  Surgeon: Darron Deatrice LABOR, MD;  Location: [facility];  Service: Cardiovascular;  Laterality: Left;   MASTECTOMY  1986   nipple sparing mastectomy/Bilateral with silicone  breast implants, s/p saline replacements   Multiple orthopedic procedures     NOSE SURGERY     PACEMAKER IMPLANT N/A 04/13/2023   Procedure: PACEMAKER IMPLANT;  Surgeon: Fernande Elspeth BROCKS, MD;  Location: [facility];  Service: Cardiovascular;  Laterality: N/A;   TEE WITHOUT CARDIOVERSION N/A 09/26/2017   Procedure: TRANSESOPHAGEAL ECHOCARDIOGRAM (TEE);  Surgeon: Okey Vina GAILS, MD;  Location: [facility];  Service: Cardiovascular;  Laterality: N/A;   TOTAL KNEE ARTHROPLASTY Left     Allergies: Allergies as of 06/11/2024 - Review Complete 05/29/2024  Allergen Reaction Noted   Cyclobenzaprine Other (See Comments) 03/04/2023   Iodine  Anaphylaxis    Biaxin [clarithromycin] Nausea And Vomiting 03/13/2013   Codeine Nausea And Vomiting    Digoxin  and related Other (See Comments) 07/03/2018   Diltiazem  Other (See Comments) 02/21/2023   Enalapril Other (See Comments) 09/20/2016   Famotidine   07/13/2021   Fluocinonide Other (See Comments) 06/12/2015   Iodinated contrast media Other (See Comments) 05/12/2018   Iodine -131  07/20/2022   Losartan  potassium Other (See Comments) 04/06/2021   Omnicef [cefdinir]  07/20/2022   Oxycodone Nausea And Vomiting 08/06/2016   Promethazine  Other (See Comments) 12/11/2014   Sertraline   10/15/2021   Tizanidine hcl  Other (See Comments) 11/22/2017   Warfarin and related  10/13/2016   Xarelto  [rivaroxaban ] Other (See Comments) 09/20/2016    Medications: Outpatient Encounter Medications as of 06/11/2024  Medication Sig   acetaminophen  (TYLENOL ) 500 MG tablet Take 1,000 mg by mouth every 6 (six) hours as needed (pain).    amiodarone  (PACERONE ) 200 MG tablet Take 1 tablet (200 mg) by mouth twice daily for 4 weeks, then decrease to 1 tablet (200 mg) by mouth daily   amLODipine  (NORVASC ) 2.5 MG tablet Take 2.5 mg by mouth daily as needed.   Carboxymethylcellul-Glycerin (LUBRICATING EYE DROPS OP) Place 1 drop into both eyes daily as needed (dry eyes).   cholecalciferol  (VITAMIN D ) 1000 units tablet Take 1,000 Units by mouth daily.   cyanocobalamin  (VITAMIN B12) 500 MCG tablet Take 500 mcg by mouth daily.   donepezil  (ARICEPT  ODT) 10 MG disintegrating tablet Take 1 tablet by mouth at bedtime.   furosemide  (LASIX ) 20 MG tablet Take 1 tablet (20 mg total) by mouth as needed for  fluid or edema.   metoprolol  succinate (TOPROL -XL) 25 MG 24 hr tablet Take 1 tablet (25 mg total) by mouth 2 (two) times daily.   mupirocin  ointment (BACTROBAN ) 2 % Apply 1 Application topically 2 (two) times daily. Apply to wound and cover with bandage until healed.   pantoprazole  (PROTONIX ) 40 MG tablet Take 1 tablet (40 mg total) by mouth daily.   pravastatin  (PRAVACHOL ) 20 MG tablet TAKE 1 TABLET BY MOUTH DAILY   sodium chloride  (OCEAN) 0.65 % SOLN nasal spray Place 1 spray into both nostrils daily as needed for congestion.   aluminum-magnesium  hydroxide 200-200 MG/5ML suspension Take 20 mLs by mouth every 6 (six) hours as needed for indigestion.  (Patient not taking: Reported on 06/11/2024)   amoxicillin  (AMOXIL ) 500 MG capsule Take by mouth. (Patient not taking: Reported on 06/11/2024)   apixaban  (ELIQUIS ) 2.5 MG TABS tablet Take 1 tablet (2.5 mg total) by mouth 2 (two) times daily. (Patient not taking: Reported on 06/11/2024)   No  facility-administered encounter medications on file as of 06/11/2024.    Social History: Social History   Tobacco Use   Smoking status: Never   Smokeless tobacco: Never  Vaping Use   Vaping status: Never Used  Substance Use Topics   Alcohol  use: No   Drug use: No    Family Medical History: Family History  Problem Relation Age of Onset   Heart disease Mother    Stomach cancer Father    Breast cancer Sister 106   Breast cancer Sister 59   Breast cancer Sister 3   Breast cancer Sister    Breast cancer Maternal Grandmother    Diabetes Brother    Esophageal cancer Brother    Kidney failure Brother    Heart disease Son        found at autopsy   Ovarian cancer Neg Hx     Physical Examination: @VITALWITHPAIN @  General: Patient is well developed, well nourished, calm, collected, and in no apparent distress. Attention to examination is appropriate.  Psychiatric: Patient is non-anxious.  Head:  Pupils equal, round, and reactive to light.  ENT:  Oral mucosa appears well hydrated.  Neck:   Supple.  Full range of motion.  Respiratory: Patient is breathing without any difficulty.  Extremities: No edema.  Vascular: Palpable dorsal pedal pulses.  Skin:   On exposed skin, there are no abnormal skin lesions.  NEUROLOGICAL:     Awake, alert, oriented to person, place, and time.  Speech is clear and fluent. Fund of knowledge is appropriate.   Cranial Nerves: Pupils equal round and reactive to light.  Facial tone is symmetric.  Cranial nerves grossly intact.  Strength: Active range of motion of bilateral upper extremities demonstrating good strength to the extent the patient is able to follow commands.  Patient was in a wheelchair. Medical Decision Making  Imaging: EXAM: CT HEAD WITHOUT CONTRAST   TECHNIQUE: Contiguous axial images were obtained from the base of the skull through the vertex without intravenous contrast.   RADIATION DOSE REDUCTION: This exam was  performed according to the departmental dose-optimization program which includes automated exposure control, adjustment of the mA and/or kV according to patient size and/or use of iterative reconstruction technique.   COMPARISON:  CT of the head dated May 30, 2024.   FINDINGS: Brain: A right supra tentorial subdural hematoma is again demonstrated. It has marginally decreased in maximal thickness on the coronal reformations from 10 mm to 9 mm, but it appears similar in size  overall to the previous study. There is no evidence of interval hemorrhage. There is age related cerebral volume loss present. There is mild periventricular white matter disease.   Vascular: Unremarkable.   Skull: Intact and unremarkable.   Sinuses/Orbits: Status post bilateral lens replacement. The paranasal sinuses are clear.   Other: None.   IMPRESSION: 1. No significant interval change in the size of right sided supratentorial subdural hematoma.  I have personally reviewed the images and agree with the above interpretation.  Assessment and Plan: Ms. [name] [name] is a pleasant 88 y.o. female with past medical history of hypertension, hyperlipidemia, diabetes, atrial fibrillation on Eliquis , CHF, and dementia who presents for follow-up with subdural hematoma following a fall approximately 2 weeks ago.  Patient is unable to differentiate if she has new symptoms right now.  Her husband states that she occasionally complains of headache.  On examination, cranial nerves grossly intact.  Strength is to baseline.  Most recent CT scan reviewed which shows no significant interval change of right sided supratentorial subdural hematoma.  Plan to see patient in clinic today.  At this time no need for further imaging unless patient has repeat fall or change in symptoms.  Plan to resume with any medication in 2 weeks.  All questions answered and concerns addressed.    Thank you for involving me in the care of this patient.    I spent a total of 45 minutes in both face-to-face and non-face-to-face activities for this visit on the date of this encounter preparing to see the patient, obtaining and reviewing separate obtained history, for medically appropriate examination, counseling the patient and her caregiver, documenting clinical information, communicating with other healthcare professionals.  Lyle Decamp, PA-C Dept. of Neurosurgery

## 2024-06-07 NOTE — Telephone Encounter (Signed)
 STAT if patient feels like he/she is going to faint   Are you dizzy, lightheaded, or faint now?  Yes but not feeling like going to faint   Have you passed out? No IF YES MOVE TO .SYNCOPECVD  Do you have any other symptoms? No just dizziness and headaches as well  Have you checked your HR and BP (record if available)? No.    Pt c/o medication issue:  1. Name of Medication:   apixaban  (ELIQUIS ) 2.5 MG TABS tablet    2. How are you currently taking this medication (dosage and times per day)?  Take 1 tablet (2.5 mg total) by mouth 2 (two) times daily.     3. Are you having a reaction (difficulty breathing--STAT)? No  4. What is your medication issue? Pt was taken off of this medication since recent hospital visit but her husband is requesting a callback regarding when she should start back. He's worried since they were told if she's not on this medication she could possibly have a stroke. Please advise

## 2024-06-07 NOTE — Telephone Encounter (Signed)
 Riddle, Suzann, NP     06/07/24  9:30 AM Thanks for update.  Yes, needs to hold eliquis  with [facility] until cleared to restart.

## 2024-06-07 NOTE — Telephone Encounter (Signed)
 Left voicemail message to call back

## 2024-06-11 ENCOUNTER — Ambulatory Visit: Admitting: Physician Assistant

## 2024-06-11 VITALS — BP 128/68

## 2024-06-11 DIAGNOSIS — S065XAA Traumatic subdural hemorrhage with loss of consciousness status unknown, initial encounter: Secondary | ICD-10-CM | POA: Diagnosis not present

## 2024-06-11 DIAGNOSIS — W19XXXA Unspecified fall, initial encounter: Secondary | ICD-10-CM | POA: Diagnosis not present

## 2024-06-14 DIAGNOSIS — C4A9 Merkel cell carcinoma, unspecified: Secondary | ICD-10-CM | POA: Diagnosis not present

## 2024-06-14 DIAGNOSIS — E785 Hyperlipidemia, unspecified: Secondary | ICD-10-CM | POA: Diagnosis not present

## 2024-06-14 DIAGNOSIS — Z950 Presence of cardiac pacemaker: Secondary | ICD-10-CM | POA: Diagnosis not present

## 2024-06-14 DIAGNOSIS — C4A4 Merkel cell carcinoma of scalp and neck: Secondary | ICD-10-CM | POA: Diagnosis not present

## 2024-06-14 DIAGNOSIS — I129 Hypertensive chronic kidney disease with stage 1 through stage 4 chronic kidney disease, or unspecified chronic kidney disease: Secondary | ICD-10-CM | POA: Diagnosis not present

## 2024-06-14 DIAGNOSIS — N183 Chronic kidney disease, stage 3 unspecified: Secondary | ICD-10-CM | POA: Diagnosis not present

## 2024-06-14 DIAGNOSIS — E1122 Type 2 diabetes mellitus with diabetic chronic kidney disease: Secondary | ICD-10-CM | POA: Diagnosis not present

## 2024-06-15 ENCOUNTER — Ambulatory Visit: Payer: Self-pay

## 2024-06-15 ENCOUNTER — Ambulatory Visit: Admission: EM | Admit: 2024-06-15 | Discharge: 2024-06-15 | Discharge status: Against Medical Advice

## 2024-06-15 NOTE — Telephone Encounter (Signed)
 FYI Only or Action Required?: FYI only for provider.  Patient was last seen in primary care on 05/09/2024 by Marylynn Verneita CROME, MD. Called Nurse Triage reporting Leg Swelling and Foot Swelling. Symptoms began about a month ago. Interventions attempted: Prescription medications: Lasix . Symptoms are: bilateral leg swelling with redness and painful unchanged.  Triage Disposition: See HCP Within 4 Hours (Or PCP Triage)  Patient/caregiver understands and will follow disposition?: Yes                 Copied from CRM [id number]. Topic: Clinical - Red Word Triage >> Jun 15, 2024  9:06 AM Ernestene P wrote: Red Word that prompted transfer to Nurse Triage: legs swelling Reason for Disposition  [1] Red area or streak [2] large (> 2 in. or 5 cm)  Answer Assessment - Initial Assessment Questions Neurosurgeon told husband her legs are swelling and she needs to be seen by PCP. Husband is hard of hearing and states the patient has dementia, difficulty with triage assessment. No available appointments at PCP office, advised husband to take patient to urgent care. He is very hard of hearing and had a difficult time hearing and understanding care advice instructions.   1. ONSET: When did the swelling start? (e.g., minutes, hours, days)     A good while, about a month or more.  2. LOCATION: What part of the leg is swollen?  Are both legs swollen or just one leg?     Bilateral.  3. SEVERITY: How bad is the swelling? (e.g., localized; mild, moderate, severe)   - Localized: Small area of swelling localized to one leg.   - MILD pedal edema: Swelling limited to foot and ankle, pitting edema < 1/4 inch (6 mm) deep, rest and elevation eliminate most or all swelling.   - MODERATE edema: Swelling of lower leg to knee, pitting edema > 1/4 inch (6 mm) deep, rest and elevation only partially reduce swelling.   - SEVERE edema: Swelling extends above knee, facial or hand swelling present.      He states  he is unsure if it goes above the knee.  4. REDNESS: Does the swelling look red or infected?     Red.  5. PAIN: Is the swelling painful to touch? If Yes, ask: How painful is it?   (Scale 1-10; mild, moderate or severe)     Yes, her legs are sore  6. FEVER: Do you have a fever? If Yes, ask: What is it, how was it measured, and when did it start?      No.  7. CAUSE: What do you think is causing the leg swelling?     Unsure.  8. MEDICAL HISTORY: Do you have a history of blood clots (e.g., DVT), cancer, heart failure, kidney disease, or liver failure?     CHF.  9. RECURRENT SYMPTOM: Have you had leg swelling before? If Yes, ask: When was the last time? What happened that time?     Yes, he states in the past she has had swelling and is on Lasix  for it but doesn't seem to be helping.  10. OTHER SYMPTOMS: Do you have any other symptoms? (e.g., chest pain, difficulty breathing)       Cold, feeling chilled all the time  11. PREGNANCY: Is there any chance you are pregnant? When was your last menstrual period?       N/A.  Protocols used: Leg Swelling and Edema-A-AH

## 2024-06-15 NOTE — Telephone Encounter (Signed)
   FYI Only or Action Required?: Action required by provider: request for appointment and update on patient condition.  Patient was last seen in primary care on 05/09/2024 by Marylynn Verneita CROME, MD. Called Nurse Triage reporting Information Only.   Triage Disposition: Call PCP When Office is Open  Patient/caregiver understands and will follow disposition?: No, wishes to speak with PCP---Patient's husband is transferred over to Office Staff for further assistance                   Copied from CRM [id number]. Topic: Clinical - Red Word Triage >> Jun 15, 2024  9:06 AM Ernestene P wrote: Red Word that prompted transfer to Nurse Triage: legs swelling >> Jun 15, 2024  2:45 PM Martinique E wrote: Patient stating she did not see anyone at [facility] because she got too tired and left. Still having swollen legs. Reason for Disposition  [1] Caller requesting NON-URGENT health information AND [2] PCP's office is the best resource  Answer Assessment - Initial Assessment Questions Patient and her husband waited about an hour at Urgent Care today and decided to leave before being seen. Husband is frustrated that the patient's PCP was unable to get her in today to be seen. This RN called [facility] to advise them that the patient did not get seen at Urgent Care and the husband was upset about not being able to get in to see his provider today. For best patient care practice at this time the patient is warm transferred to the [facility] to speak with Harlene ORN. Who attempted to reach him earlier this morning. She is advised that the patient did not stay at the Urgent Care today to be seen and the husband was frustrated about getting her taken care. Patient is connected to her for further assistance at this time.  Protocols used: Information Only Call - No Triage-A-AH

## 2024-06-15 NOTE — Telephone Encounter (Signed)
 FYI. Pt was seen at [facility] today.

## 2024-06-15 NOTE — Telephone Encounter (Signed)
 LMTCB

## 2024-06-15 NOTE — Telephone Encounter (Signed)
 Copied from CRM [id number]. Topic: Clinical - Red Word Triage >> Jun 15, 2024  2:45 PM Martinique E wrote: Patient stating she did not see anyone at [facility] because she got too tired and left. Still having swollen legs.

## 2024-06-15 NOTE — Telephone Encounter (Signed)
 Spoke with pt's husband and tried to convince him to take pt back to UC. Husband stated that he would keep an eye on pt and if it got worse he would take pt to the ED. Pt was not having any other symptoms other than the bilateral leg swelling. Pt's husband declined to schedule an appt for next week. He stated that he will call her cardiologist on Monday.

## 2024-06-17 ENCOUNTER — Other Ambulatory Visit: Payer: Self-pay | Admitting: Internal Medicine

## 2024-06-19 ENCOUNTER — Ambulatory Visit: Admitting: Physician Assistant

## 2024-06-20 NOTE — Telephone Encounter (Signed)
 Spoke with pt's husband and he stated that pt is going to see the cardiologist tomorrow. He also stated that pt takes the lasix  once daily in the morning.

## 2024-06-21 ENCOUNTER — Ambulatory Visit: Attending: Cardiology | Admitting: Cardiology

## 2024-06-21 ENCOUNTER — Encounter: Payer: Self-pay | Admitting: Cardiology

## 2024-06-21 VITALS — BP 112/64 | HR 60 | Ht 64.0 in | Wt 143.4 lb

## 2024-06-21 DIAGNOSIS — I495 Sick sinus syndrome: Secondary | ICD-10-CM | POA: Diagnosis not present

## 2024-06-21 DIAGNOSIS — Z79899 Other long term (current) drug therapy: Secondary | ICD-10-CM

## 2024-06-21 DIAGNOSIS — I4821 Permanent atrial fibrillation: Secondary | ICD-10-CM

## 2024-06-21 DIAGNOSIS — Z950 Presence of cardiac pacemaker: Secondary | ICD-10-CM | POA: Diagnosis not present

## 2024-06-21 DIAGNOSIS — D6869 Other thrombophilia: Secondary | ICD-10-CM

## 2024-06-21 DIAGNOSIS — R600 Localized edema: Secondary | ICD-10-CM | POA: Diagnosis not present

## 2024-06-21 DIAGNOSIS — Z5181 Encounter for therapeutic drug level monitoring: Secondary | ICD-10-CM

## 2024-06-21 LAB — CUP PACEART INCLINIC DEVICE CHECK
Date Time Interrogation Session: 20250703145744
Implantable Lead Connection Status: 753985
Implantable Lead Implant Date: 20240424
Implantable Lead Location: 753860
Implantable Lead Model: 1948
Implantable Pulse Generator Implant Date: 20240424
Pulse Gen Model: 1272
Pulse Gen Serial Number: 8124077

## 2024-06-21 MED ORDER — TORSEMIDE 20 MG PO TABS
ORAL_TABLET | ORAL | 0 refills | Status: DC
Start: 2024-06-21 — End: 2024-07-04

## 2024-06-21 MED ORDER — TORSEMIDE 20 MG PO TABS: 20.0000 mg | ORAL_TABLET | Freq: Every day | ORAL | 3 refills | Status: DC

## 2024-06-21 MED ORDER — POTASSIUM CHLORIDE CRYS ER 20 MEQ PO TBCR: 20.0000 meq | EXTENDED_RELEASE_TABLET | Freq: Every day | ORAL | 3 refills | Status: DC

## 2024-06-21 NOTE — Progress Notes (Signed)
 Electrophysiology Clinic Note    Date:  06/21/2024  Patient ID:  [name], [name] [birth year], MRN [id number] PCP:  Marylynn Verneita CROME, MD  Cardiologist:  Deatrice Cage, MD Electrophysiologist: Elspeth Sage, MD   Discussed the use of AI scribe software for clinical note transcription with the patient, who gave verbal consent to proceed.   Patient Profile    Chief Complaint: amio follow-up  History of Present Illness: [name] [name] is a 88 y.o. female with PMH notable for perm afib/aflutter, HTN, tachy-brady s/p PPM, cardiomyopathy (now resolved), severe cognitive impairment; seen today for Elspeth Sage, MD for routine EP follow-up.   She was admitted to [facility] hospital 03/2023 after syncopal episode. Several sinus pauses noted, and so PPM was implanted.  She has been seen several times since that time with various concerns for increased edema and lack of HR variability.   I last saw her 03/2024 where she was having increased edema, but not taking PRN lasix . I pulsed her lasix  for 3 days then advised to resume PRN.  She had a fall 05/2024 with SDH and eliquis  was held. She saw Neurosurgery 6/23, with stable head CT. Recommended to resume eliquis  06/25/2024.   On follow-up today, She experiences significant bilateral lower extremity edema, unresponsive to Lasix  over the past two weeks. Previously, she was on torsemide , which was discontinued in February. Her husband is concerned about the effectiveness of Lasix  compared to torsemide .  She has difficulty sleeping, waking up every hour or forty-five minutes at night. Her appetite is fair.  She and her husband deny significant coughing or shortness of breath with activity or lying flat.  Throughout our conversation the patient requests to lay down on the couch.  They bring in blood pressure log where most readings are 110s-130s systolic.    Device Information: St. Jude single chamber PPM, imp 03/2023; tachy-brady  AAD  History: Amiodarone      ROS:  Please see the history of present illness. All other systems are reviewed and otherwise negative.    Physical Exam    VS:  BP 112/64 (BP Location: Right Leg, Patient Position: Sitting, Cuff Size: Normal)   Pulse 60   Ht 5' 4 (1.626 m)   Wt 143 lb 6.4 oz (65 kg)   SpO2 98%   BMI 24.61 kg/m  BMI: Body mass index is 24.61 kg/m.  Wt Readings from Last 3 Encounters:  06/21/24 143 lb 6.4 oz (65 kg)  05/29/24 138 lb 14.2 oz (63 kg)  05/15/24 140 lb 9.6 oz (63.8 kg)     GEN- The patient is well appearing, alert .   Lungs- Diminished on RLL, CTA otherwise, normal work of breathing. Converses easily Heart- Regular rate and rhythm, no murmurs, rubs or gallops Extremities- 2-3+ peripheral edema, warm, dry Skin-  device pocket well-healed, no tethering   Device interrogation done today and reviewed by myself:  Battery 9.6 years  Lead threshold, impedence, sensing stable  VP at 85% Very low daily activity No episodes  Studies Reviewed   Previous EP, cardiology notes.    EKG is not ordered. Personal review of EKG from 05/29/2024 shows: VP at 62 QRS       TTE, 06/06/2023  1. Left ventricular ejection fraction, by estimation, is 55 to 60%. The left ventricle has normal function.   2. Right ventricular systolic function is normal. The right ventricular size is moderately enlarged. There is mildly elevated pulmonary artery systolic pressure.   3.  Left atrial size was severely dilated.   4. Right atrial size was severely dilated.   5. The mitral valve is normal in structure. Mild mitral valve regurgitation.   6. Tricuspid valve regurgitation is moderate to severe.   7. The inferior vena cava is dilated in size with <50% respiratory variability, suggesting right atrial pressure of 15 mmHg.   TTE, 04/08/2023  1. Left ventricular ejection fraction, by estimation, is 55 to 60%. The left ventricle has normal function. The left ventricle has no  regional wall motion abnormalities. Left ventricular diastolic parameters are indeterminate.   2. Right ventricular systolic function is normal. The right ventricular size is mildly enlarged. There is moderately elevated pulmonary artery systolic pressure. The estimated right ventricular systolic pressure is 51.8 mmHg.   3. Left atrial size was severely dilated.   4. Right atrial size was severely dilated.   5. The mitral valve is normal in structure. Mild to moderate mitral valve regurgitation. No evidence of mitral stenosis.   6. Tricuspid valve regurgitation is mild to moderate.   7. The aortic valve is normal in structure. Aortic valve regurgitation is not visualized. No aortic stenosis is present.   8. The inferior vena cava is normal in size with greater than 50% respiratory variability, suggesting right atrial pressure of 3 mmHg.     Assessment and Plan     #) Perm afib/flutter #) amiodarone  monitoring Good rate control on 200mg  amiodarone  daily No HVR episodes Update thyroid  labs today   #) Hypercoag d/t perm afib #) recent SDH CHA2DS2-VASc Score = at least 6 [CHF History: 1, HTN History: 1, Diabetes History: 0, Stroke History: 0, Vascular Disease History: 1, Age Score: 2, Gender Score: 1].  Therefore, the patient's annual risk of stroke is 9.7 %.    Stroke ppx - 2.5mg  eliquis  BID, appropriately reduced for age, weight Currently on hold for recent SDH Per neurosurgery, ok to resume eliquis  06/25/2024   #) Tachy-brady s/p PPM Lead measurements stable See paceart for details  #) lower extremity edema Having significant lower extremity edema without improvement on 20mg  lasix  daily No s/s of CHF exacerbation, denies cough, SOB, abd fullness Update CMP, BNP today Stop lasix  Restart 20mg  torsemide  daily x 3 days, then PRN Take potassium supplement with lasix  Close follow-up with gen cards for ongoing mgmt          Current medicines are reviewed at length with the  patient today.   The patient has concerns regarding her medicines.  The following changes were made today:   STOP lasix  START 20mg  torsemide  daily x 2-3 days, then PRN    Labs/ tests ordered today include:  Orders Placed This Encounter  Procedures   Comprehensive metabolic panel with GFR   Brain natriuretic peptide   TSH   T4     Disposition: Follow up with Dr. Kennyth or EP APP in 6 months, prefer MD to establish care.  Follow-up with gen cards APP in ~2 weeks   Signed, Cicley Ganesh, NP  06/21/24  2:47 PM  Electrophysiology [facility]

## 2024-06-21 NOTE — Patient Instructions (Signed)
 Medication Instructions:  -STOP lasix    -START torsemide  20 mg (take one torsemide  daily for 3 days, then as needed)  -START potassium chloride  (this is to be taken with torsemide  daily and as needed)  *If you need a refill on your cardiac medications before your next appointment, please call your pharmacy*  Lab Work: Your provider would like for you to have following labs drawn today TSH, T4. BNP, CMP.    If you have labs (blood work) drawn today and your tests are completely normal, you will receive your results only by: MyChart Message (if you have MyChart) OR A paper copy in the mail If you have any lab test that is abnormal or we need to change your treatment, we will call you to review the results.  Testing/Procedures: No test ordered today   Follow-Up: At [facility], you and your health needs are our priority.  As part of our continuing mission to provide you with exceptional heart care, our providers are all part of one team.  This team includes your primary Cardiologist (physician) and Advanced Practice Providers or APPs (Physician Assistants and Nurse Practitioners) who all work together to provide you with the care you need, when you need it.  Your next appointment:   6 month(s)  Provider:   You may see Suzann Riddle or Dr Kennyth.   We recommend signing up for the patient portal called MyChart.  Sign up information is provided on this After Visit Summary.  MyChart is used to connect with patients for Virtual Visits (Telemedicine).  Patients are able to view lab/test results, encounter notes, upcoming appointments, etc.  Non-urgent messages can be sent to your provider as well.   To learn more about what you can do with MyChart, go to [email or web address].

## 2024-06-22 LAB — COMPREHENSIVE METABOLIC PANEL WITH GFR
ALT: 22 IU/L (ref 0–32)
AST: 25 IU/L (ref 0–40)
Albumin: 3.9 g/dL (ref 3.7–4.7)
Alkaline Phosphatase: 116 IU/L (ref 44–121)
BUN/Creatinine Ratio: 19 (ref 12–28)
BUN: 19 mg/dL (ref 8–27)
Bilirubin Total: 1.2 mg/dL (ref 0.0–1.2)
CO2: 21 mmol/L (ref 20–29)
Calcium: 9.1 mg/dL (ref 8.7–10.3)
Chloride: 98 mmol/L (ref 96–106)
Creatinine, Ser: 0.99 mg/dL (ref 0.57–1.00)
Globulin, Total: 2.7 g/dL (ref 1.5–4.5)
Glucose: 87 mg/dL (ref 70–99)
Potassium: 4.5 mmol/L (ref 3.5–5.2)
Sodium: 135 mmol/L (ref 134–144)
Total Protein: 6.6 g/dL (ref 6.0–8.5)
eGFR: 56 mL/min/1.73 — ABNORMAL LOW (ref >59)

## 2024-06-22 LAB — T4: T4, Total: 5.5 ug/dL (ref 4.5–12.0)

## 2024-06-22 LAB — BRAIN NATRIURETIC PEPTIDE: BNP: 534.8 pg/mL — ABNORMAL HIGH (ref 0.0–100.0)

## 2024-06-22 LAB — TSH: TSH: 12.2 u[IU]/mL — ABNORMAL HIGH (ref 0.450–4.500)

## 2024-06-23 ENCOUNTER — Ambulatory Visit: Payer: Self-pay | Admitting: Cardiology

## 2024-06-25 ENCOUNTER — Telehealth: Payer: Self-pay

## 2024-06-25 NOTE — Telephone Encounter (Signed)
 Spoke with patient and he stated that Suzann wanted him to call on Monday with update on patient. He states that her swelling has gotten better and gone down but they are hard. He wanted to know if he should continue torsemide  and advised to take as needed for increased swelling. He verbalized understanding with no further questions at this time.   Will forward update to APP

## 2024-06-25 NOTE — Telephone Encounter (Signed)
 The pt husband called to talk with Elvie about the pt fluid levels. I told him I will send a phone note for someone to give them a call back.

## 2024-06-26 DIAGNOSIS — Z45018 Encounter for adjustment and management of other part of cardiac pacemaker: Secondary | ICD-10-CM | POA: Diagnosis not present

## 2024-06-26 DIAGNOSIS — Z510 Encounter for antineoplastic radiation therapy: Secondary | ICD-10-CM | POA: Diagnosis not present

## 2024-06-26 DIAGNOSIS — C4A4 Merkel cell carcinoma of scalp and neck: Secondary | ICD-10-CM | POA: Diagnosis not present

## 2024-06-27 ENCOUNTER — Emergency Department
Admission: EM | Admit: 2024-06-27 | Discharge: 2024-06-27 | Disposition: A | Discharge status: Home / Self Care | Attending: Emergency Medicine | Admitting: Emergency Medicine

## 2024-06-27 ENCOUNTER — Emergency Department

## 2024-06-27 ENCOUNTER — Other Ambulatory Visit: Payer: Self-pay

## 2024-06-27 DIAGNOSIS — N189 Chronic kidney disease, unspecified: Secondary | ICD-10-CM | POA: Diagnosis not present

## 2024-06-27 DIAGNOSIS — W01198A Fall on same level from slipping, tripping and stumbling with subsequent striking against other object, initial encounter: Secondary | ICD-10-CM | POA: Insufficient documentation

## 2024-06-27 DIAGNOSIS — Z510 Encounter for antineoplastic radiation therapy: Secondary | ICD-10-CM | POA: Diagnosis not present

## 2024-06-27 DIAGNOSIS — R9082 White matter disease, unspecified: Secondary | ICD-10-CM | POA: Diagnosis not present

## 2024-06-27 DIAGNOSIS — C4A4 Merkel cell carcinoma of scalp and neck: Secondary | ICD-10-CM | POA: Diagnosis not present

## 2024-06-27 DIAGNOSIS — Z45018 Encounter for adjustment and management of other part of cardiac pacemaker: Secondary | ICD-10-CM | POA: Diagnosis not present

## 2024-06-27 DIAGNOSIS — S199XXA Unspecified injury of neck, initial encounter: Secondary | ICD-10-CM | POA: Diagnosis not present

## 2024-06-27 DIAGNOSIS — I129 Hypertensive chronic kidney disease with stage 1 through stage 4 chronic kidney disease, or unspecified chronic kidney disease: Secondary | ICD-10-CM | POA: Insufficient documentation

## 2024-06-27 DIAGNOSIS — S0990XA Unspecified injury of head, initial encounter: Secondary | ICD-10-CM | POA: Diagnosis not present

## 2024-06-27 DIAGNOSIS — S0993XA Unspecified injury of face, initial encounter: Secondary | ICD-10-CM | POA: Diagnosis not present

## 2024-06-27 DIAGNOSIS — Z7901 Long term (current) use of anticoagulants: Secondary | ICD-10-CM | POA: Insufficient documentation

## 2024-06-27 DIAGNOSIS — F0390 Unspecified dementia without behavioral disturbance: Secondary | ICD-10-CM | POA: Insufficient documentation

## 2024-06-27 DIAGNOSIS — M5030 Other cervical disc degeneration, unspecified cervical region: Secondary | ICD-10-CM | POA: Diagnosis not present

## 2024-06-27 DIAGNOSIS — Z85828 Personal history of other malignant neoplasm of skin: Secondary | ICD-10-CM | POA: Insufficient documentation

## 2024-06-27 DIAGNOSIS — R42 Dizziness and giddiness: Secondary | ICD-10-CM | POA: Insufficient documentation

## 2024-06-27 DIAGNOSIS — M47812 Spondylosis without myelopathy or radiculopathy, cervical region: Secondary | ICD-10-CM | POA: Diagnosis not present

## 2024-06-27 DIAGNOSIS — W19XXXA Unspecified fall, initial encounter: Secondary | ICD-10-CM

## 2024-06-27 HISTORY — DX: Unspecified dementia, unspecified severity, without behavioral disturbance, psychotic disturbance, mood disturbance, and anxiety: F03.90

## 2024-06-27 LAB — CBC WITH DIFFERENTIAL/PLATELET
Abs Immature Granulocytes: 0.01 K/uL (ref 0.00–0.07)
Basophils Absolute: 0 K/uL (ref 0.0–0.1)
Basophils Relative: 1 %
Eosinophils Absolute: 0 K/uL (ref 0.0–0.5)
Eosinophils Relative: 1 %
HCT: 35.8 % — ABNORMAL LOW (ref 36.0–46.0)
Hemoglobin: 11.9 g/dL — ABNORMAL LOW (ref 12.0–15.0)
Immature Granulocytes: 0 %
Lymphocytes Relative: 15 %
Lymphs Abs: 0.6 K/uL — ABNORMAL LOW (ref 0.7–4.0)
MCH: 32.2 pg (ref 26.0–34.0)
MCHC: 33.2 g/dL (ref 30.0–36.0)
MCV: 96.8 fL (ref 80.0–100.0)
Monocytes Absolute: 0.4 K/uL (ref 0.1–1.0)
Monocytes Relative: 10 %
Neutro Abs: 3.1 K/uL (ref 1.7–7.7)
Neutrophils Relative %: 73 %
Platelets: 203 K/uL (ref 150–400)
RBC: 3.7 MIL/uL — ABNORMAL LOW (ref 3.87–5.11)
RDW: 15.9 % — ABNORMAL HIGH (ref 11.5–15.5)
WBC: 4.1 K/uL (ref 4.0–10.5)
nRBC: 0 % (ref 0.0–0.2)

## 2024-06-27 LAB — COMPREHENSIVE METABOLIC PANEL WITH GFR
ALT: 24 U/L (ref 0–44)
AST: 30 U/L (ref 15–41)
Albumin: 3.9 g/dL (ref 3.5–5.0)
Alkaline Phosphatase: 88 U/L (ref 38–126)
Anion gap: 10 (ref 5–15)
BUN: 22 mg/dL (ref 8–23)
CO2: 23 mmol/L (ref 22–32)
Calcium: 9.5 mg/dL (ref 8.9–10.3)
Chloride: 103 mmol/L (ref 98–111)
Creatinine, Ser: 0.9 mg/dL (ref 0.44–1.00)
GFR, Estimated: >60 mL/min (ref >60)
Glucose, Bld: 114 mg/dL — ABNORMAL HIGH (ref 70–99)
Potassium: 4.2 mmol/L (ref 3.5–5.1)
Sodium: 136 mmol/L (ref 135–145)
Total Bilirubin: 1.3 mg/dL — ABNORMAL HIGH (ref 0.0–1.2)
Total Protein: 7.4 g/dL (ref 6.5–8.1)

## 2024-06-27 LAB — URINALYSIS, ROUTINE W REFLEX MICROSCOPIC
Bilirubin Urine: NEGATIVE
Glucose, UA: NEGATIVE mg/dL
Hgb urine dipstick: NEGATIVE
Ketones, ur: NEGATIVE mg/dL
Leukocytes,Ua: NEGATIVE
Nitrite: NEGATIVE
Protein, ur: NEGATIVE mg/dL
Specific Gravity, Urine: 1.005 (ref 1.005–1.030)
pH: 5 (ref 5.0–8.0)

## 2024-06-27 NOTE — ED Triage Notes (Signed)
 Husband states patient was complaining of dizziness and fell backwards; on Eliquis .

## 2024-06-27 NOTE — Telephone Encounter (Signed)
 Called patient, spoke with her, husband and son- advised of the following message below.  They verbalized understanding.

## 2024-06-27 NOTE — ED Notes (Signed)
 PA, Lauren at bedside; update provided to patient.

## 2024-06-27 NOTE — ED Provider Notes (Signed)
 [facility] Provider Note    Event Date/Time   First MD Initiated Contact with Patient 06/27/24 1818     (approximate)   History   Fall   HPI  [name] [name] is a 88 y.o. female with PMH of hypertension, A-fib on Eliquis , CKD, dementia, presyncope, vertigo and skin cancer presents for evaluation after a fall.  Patient's husband stated that the patient told him she was feeling dizzy today.  She fell backwards and hit her arm and head.  They wanted to bring her to the ER to get checked out since she is on Eliquis .  Patient is unable to further describe the dizzy sensation.  States she did not pass out today.  She states that this happens occasionally where she feels dizzy.  She attributed that to being outside in the heat.      Physical Exam   Triage Vital Signs: ED Triage Vitals [06/27/24 1617]  Encounter Vitals Group     BP (!) 127/55     Girls Systolic BP Percentile      Girls Diastolic BP Percentile      Boys Systolic BP Percentile      Boys Diastolic BP Percentile      Pulse Rate 62     Resp 18     Temp 97.8 F (36.6 C)     Temp Source Oral     SpO2 96 %     Weight      Height      Head Circumference      Peak Flow      Pain Score 3     Pain Loc      Pain Education      Exclude from Growth Chart     Most recent vital signs: Vitals:   06/27/24 1617  BP: (!) 127/55  Pulse: 62  Resp: 18  Temp: 97.8 F (36.6 C)  SpO2: 96%   General: Awake, no distress.  CV:  Good peripheral perfusion.  RRR. Resp:  Normal effort.  CTAB. Abd:  No distention.  Other:  PERRL, EOM intact, no pronator drift, no ataxia, no focal neurodeficits.   ED Results / Procedures / Treatments   Labs (all labs ordered are listed, but only abnormal results are displayed) Labs Reviewed  COMPREHENSIVE METABOLIC PANEL WITH GFR - Abnormal; Notable for the following components:      Result Value   Glucose, Bld 114 (*)    Total Bilirubin 1.3 (*)    All other  components within normal limits  CBC WITH DIFFERENTIAL/PLATELET - Abnormal; Notable for the following components:   RBC 3.70 (*)    Hemoglobin 11.9 (*)    HCT 35.8 (*)    RDW 15.9 (*)    Lymphs Abs 0.6 (*)    All other components within normal limits  URINALYSIS, ROUTINE W REFLEX MICROSCOPIC - Abnormal; Notable for the following components:   Color, Urine STRAW (*)    APPearance CLEAR (*)    All other components within normal limits     EKG  ED provider interpretation: Ventricular paced rhythm  Vent. rate 60 BPM PR interval * ms QRS duration 148 ms QT/QTcB 496/496 ms P-R-T axes * 260 104  RADIOLOGY  CT head and cervical spine obtained, interpreted the images as well as reviewed the radiologist report.  Images show chronic changes but no acute abnormalities.   PROCEDURES:  Critical Care performed: No  Procedures   MEDICATIONS ORDERED IN ED: Medications -  No data to display   IMPRESSION / MDM / ASSESSMENT AND PLAN / ED COURSE  I reviewed the triage vital signs and the nursing notes.                             88 year old female presents for evaluation after a fall.  Vital signs are stable patient NAD on exam.  Differential diagnosis includes, but is not limited to, contusion, abrasion, intracranial bleed, skull fracture, AKI, electrolyte abnormality, dehydration, anemia, cardiac arrhythmia.  Patient's presentation is most consistent with acute complicated illness / injury requiring diagnostic workup.  CMP unremarkable.  CBC shows mild anemia otherwise normal.  Urinalysis without signs of infection.  CT head and cervical spine are negative for acute abnormalities.  EKG shows a ventricular paced rhythm.  Pacemaker interrogated to ensure it did not have any issues today that may have caused her to feel dizzy.  Physical exam is overall reassuring and patient has no complaints at this time.  Unsure of what exactly caused her dizziness but given the normal labs  suspect that it was due to dehydration.  Encouraged patient to stay out of the heat and drink lots of water.    Patient and husband do not want to wait for the results of the pacemaker interrogation.  Encouraged them to stay.  They are aware of risks of leaving prematurely.  Advised them to return to the emergency department if she has another fall or head injury as well as any other concerning signs or symptoms.  They voiced understanding, all questions were answered and she was stable at discharge.      FINAL CLINICAL IMPRESSION(S) / ED DIAGNOSES   Final diagnoses:  Fall, initial encounter     Rx / DC Orders   ED Discharge Orders     None        Note:  This document was prepared using Dragon voice recognition software and may include unintentional dictation errors.   Cleaster Tinnie LABOR, PA-C 06/27/24 2004    Claudene Rover, MD 06/28/24 2213

## 2024-06-27 NOTE — ED Notes (Signed)
Patient ambulatory to bathroom w/ one assist.

## 2024-06-27 NOTE — ED Notes (Addendum)
 [employer] Judes Pacemaker interrogated per this Charity fundraiser.  Patient and spouse are not willing to wait on results.  Patient's husband states, No, I know that her pacemaker is working; we have a device that sits next to the bed that tells us  when anything is wrong.    PA, Lauren made aware.

## 2024-06-27 NOTE — Discharge Instructions (Signed)
 Return to the emergency department if you have another fall where you hit your head or if you have any other symptoms concerning to you.

## 2024-06-28 DIAGNOSIS — Z45018 Encounter for adjustment and management of other part of cardiac pacemaker: Secondary | ICD-10-CM | POA: Diagnosis not present

## 2024-06-28 DIAGNOSIS — Z510 Encounter for antineoplastic radiation therapy: Secondary | ICD-10-CM | POA: Diagnosis not present

## 2024-06-28 DIAGNOSIS — C4A4 Merkel cell carcinoma of scalp and neck: Secondary | ICD-10-CM | POA: Diagnosis not present

## 2024-06-29 ENCOUNTER — Telehealth: Payer: Self-pay

## 2024-06-29 DIAGNOSIS — Z510 Encounter for antineoplastic radiation therapy: Secondary | ICD-10-CM | POA: Diagnosis not present

## 2024-06-29 DIAGNOSIS — C4A4 Merkel cell carcinoma of scalp and neck: Secondary | ICD-10-CM | POA: Diagnosis not present

## 2024-06-29 DIAGNOSIS — Z45018 Encounter for adjustment and management of other part of cardiac pacemaker: Secondary | ICD-10-CM | POA: Diagnosis not present

## 2024-06-29 NOTE — Telephone Encounter (Signed)
 Pt husband states his wife have radiation 5 times a week.They should be able to come to the appointment on July 04, 2024. He was just confirming.

## 2024-07-02 DIAGNOSIS — C4A4 Merkel cell carcinoma of scalp and neck: Secondary | ICD-10-CM | POA: Diagnosis not present

## 2024-07-02 DIAGNOSIS — Z510 Encounter for antineoplastic radiation therapy: Secondary | ICD-10-CM | POA: Diagnosis not present

## 2024-07-02 DIAGNOSIS — Z45018 Encounter for adjustment and management of other part of cardiac pacemaker: Secondary | ICD-10-CM | POA: Diagnosis not present

## 2024-07-03 DIAGNOSIS — C4A4 Merkel cell carcinoma of scalp and neck: Secondary | ICD-10-CM | POA: Diagnosis not present

## 2024-07-03 DIAGNOSIS — Z45018 Encounter for adjustment and management of other part of cardiac pacemaker: Secondary | ICD-10-CM | POA: Diagnosis not present

## 2024-07-03 DIAGNOSIS — Z510 Encounter for antineoplastic radiation therapy: Secondary | ICD-10-CM | POA: Diagnosis not present

## 2024-07-04 ENCOUNTER — Encounter: Payer: Self-pay | Admitting: Medical

## 2024-07-04 ENCOUNTER — Ambulatory Visit: Attending: Medical | Admitting: Medical

## 2024-07-04 VITALS — BP 102/50 | HR 60 | Ht 63.0 in | Wt 150.2 lb

## 2024-07-04 DIAGNOSIS — Z79899 Other long term (current) drug therapy: Secondary | ICD-10-CM | POA: Diagnosis not present

## 2024-07-04 DIAGNOSIS — Z950 Presence of cardiac pacemaker: Secondary | ICD-10-CM | POA: Diagnosis not present

## 2024-07-04 DIAGNOSIS — I5033 Acute on chronic diastolic (congestive) heart failure: Secondary | ICD-10-CM

## 2024-07-04 DIAGNOSIS — Z45018 Encounter for adjustment and management of other part of cardiac pacemaker: Secondary | ICD-10-CM | POA: Diagnosis not present

## 2024-07-04 DIAGNOSIS — I4821 Permanent atrial fibrillation: Secondary | ICD-10-CM | POA: Diagnosis not present

## 2024-07-04 DIAGNOSIS — E782 Mixed hyperlipidemia: Secondary | ICD-10-CM

## 2024-07-04 DIAGNOSIS — C4A4 Merkel cell carcinoma of scalp and neck: Secondary | ICD-10-CM | POA: Diagnosis not present

## 2024-07-04 DIAGNOSIS — Z510 Encounter for antineoplastic radiation therapy: Secondary | ICD-10-CM | POA: Diagnosis not present

## 2024-07-04 DIAGNOSIS — I10 Essential (primary) hypertension: Secondary | ICD-10-CM

## 2024-07-04 DIAGNOSIS — I5032 Chronic diastolic (congestive) heart failure: Secondary | ICD-10-CM

## 2024-07-04 MED ORDER — TORSEMIDE 20 MG PO TABS: 20.0000 mg | ORAL_TABLET | Freq: Every day | ORAL | 0 refills | Status: DC

## 2024-07-04 MED ORDER — METOPROLOL SUCCINATE ER 25 MG PO TB24: 25.0000 mg | ORAL_TABLET | Freq: Every day | ORAL | 2 refills | Status: DC

## 2024-07-04 NOTE — Progress Notes (Signed)
 Cardiology Office Note   Date:  07/04/2024  ID:  [name] [name], DOB 11/28/1936, MRN [id number] PCP: Marylynn Verneita CROME, MD  [facility] Providers Cardiologist:  Deatrice Cage, MD Electrophysiologist:  Elspeth Sage, MD   History of Present Illness [name] [name] is a 88 y.o. female with a hx of  permanent Afib/flutter, HTN, tachy-brady s/p PPM, HFimpEF, dementia who presents for 1 week follow-up.    She has a history of afib with multiple cardioversions. Now, she is rate controlled. She has a history of tachy-induced CM, but this has resolved. Cath in 2020 showed minimal irregularities with no evidence of obstructive CAD, normal LVEF and mildly elevated LVEDP.   She was hospitalized in April 2024 with syncope with prolonged pauses. She underwent single-lead pacemaker placement. Echo during admission showed normal LVSF with mid to moderate MR. She saw Dr. Cage 05/05/23 and felt very sluggish. HR was around 50bpm. Diltiazem  was discontinued and amlodipine  was added. She saw EP 05/24/23 and BP was elevated. She was started on lasix  for lower leg edema. Repeat limited echo showed LVEF 55-60%, severely dilated atrium, mild MR. In follow-up the patient was still volume up and she was switched to Torsemide .    She has had progressive bradycardia that required stopping diltiazem  and decreasing metoprolol .  She fell 05/2024 with SDH and Eliquis  was held. She saw neurosurgery 6/23 with stable head CT and Eliquis  was resumed 06/25/24.  She was seen 06/21/24 reporting worsening lower leg edema despite lasix . Lasix  was stopped and was started on Torsemide . BNP came back at 534. TSH was high and was instructed to see PCP for this.   Today, the patient reports she was taking torsemide  every other day, but stopped it 2-3 days ago. Today, she has swelling on her legs. She feels breathing is Ok. No chest pain reported.    Studies Reviewed EKG Interpretation Date/Time:  Wednesday July 04 2024 13:40:40  EDT Ventricular Rate:  60 PR Interval:    QRS Duration:  168 QT Interval:  510 QTC Calculation: 510 R Axis:   -85  Text Interpretation: Ventricular-paced rhythm When compared with ECG of 27-Jun-2024 16:28, No significant change was found Confirmed by Franchester, Shadeed Colberg ([id number]) on 07/04/2024 1:43:55 PM    Echo limited 06/06/23 1. Left ventricular ejection fraction, by estimation, is 55 to 60%. The  left ventricle has normal function.   2. Right ventricular systolic function is normal. The right ventricular  size is moderately enlarged. There is mildly elevated pulmonary artery  systolic pressure.   3. Left atrial size was severely dilated.   4. Right atrial size was severely dilated.   5. The mitral valve is normal in structure. Mild mitral valve  regurgitation.   6. Tricuspid valve regurgitation is moderate to severe.   7. The inferior vena cava is dilated in size with <50% respiratory  variability, suggesting right atrial pressure of 15 mmHg.    Echo 03/2023 1. Left ventricular ejection fraction, by estimation, is 55 to 60%. The  left ventricle has normal function. The left ventricle has no regional  wall motion abnormalities. Left ventricular diastolic parameters are  indeterminate.   2. Right ventricular systolic function is normal. The right ventricular  size is mildly enlarged. There is moderately elevated pulmonary artery  systolic pressure. The estimated right ventricular systolic pressure is  51.8 mmHg.   3. Left atrial size was severely dilated.   4. Right atrial size was severely dilated.   5. The mitral valve  is normal in structure. Mild to moderate mitral valve  regurgitation. No evidence of mitral stenosis.   6. Tricuspid valve regurgitation is mild to moderate.   7. The aortic valve is normal in structure. Aortic valve regurgitation is  not visualized. No aortic stenosis is present.   8. The inferior vena cava is normal in size with greater than 50%  respiratory  variability, suggesting right atrial pressure of 3 mmHg.    Myoview  lexiscan  08/2022 Narrative & Impression  Pharmacological myocardial perfusion imaging study with no significant  ischemia Normal wall motion, EF estimated at 57% No EKG changes concerning for ischemia at peak stress or in recovery. CT attenuation correction images with moderate aortic atherosclerosis, mild coronary calcification Low risk scan     Signed, Velinda Lunger, MD, Ph.D [facility]    LHC 2020  The left ventricular systolic function is normal. LV end diastolic pressure is mildly elevated. The left ventricular ejection fraction is 55-65% by visual estimate.   1.  Minimal irregularities with no evidence of obstructive coronary artery disease. 2.  Normal LV systolic function and mildly elevated left ventricular end-diastolic pressure.   Recommendations: The chest pain is likely noncardiac.  Continue rate control and anticoagulation for atrial fibrillation.  Can resume Eliquis  tomorrow morning.      Physical Exam VS:  BP (!) 102/50 (BP Location: Left Arm, Patient Position: Sitting, Cuff Size: Normal)   Pulse 60   Ht 5' 3 (1.6 m)   Wt 150 lb 4 oz (68.2 kg)   SpO2 97%   BMI 26.62 kg/m        Wt Readings from Last 3 Encounters:  07/04/24 150 lb 4 oz (68.2 kg)  06/21/24 143 lb 6.4 oz (65 kg)  05/29/24 138 lb 14.2 oz (63 kg)    GEN: Well nourished, well developed in no acute distress NECK: No JVD; No carotid bruits CARDIAC: RRR, no murmurs, rubs, gallops RESPIRATORY:  Clear to auscultation without rales, wheezing or rhonchi  ABDOMEN: Soft, non-tender, non-distended EXTREMITIES:  2+ lower leg edema; No deformity   ASSESSMENT AND PLAN  Acute HFimpEF H/o tachy-induced CM Patient was changed to Torsemide  at the last visit, told to take it 2-3 days then PRN after this. She took it every other day then stopped it 2-3 days ago. Today, she has 2+ lower leg edema. Breathing is good. I recommend Torsemide   20mg  daily, BMET in 2 weeks. She was instructed to take potassium with torsemide .   HTN BP soft. I will stop amlodipine  (since she hardly takes this) and decrease Toprol  to once daily. Start Torsemide  as above.   Permanent Afib She denies palpitations. Decrease Toprol  as above, recommend she take it in the AM. Continue Eliquis  2.5mg BID.   HLD LDL 70. Continue Pravastatin  20mg  daily.   Tachy-brady syndrome s/p PPM Followed by EP.       Dispo: Follow-up in 4-6 weeks  Signed, Lakira Ogando VEAR Fishman, PA-C \

## 2024-07-04 NOTE — Patient Instructions (Addendum)
 Medication Instructions:   Your physician recommends the following medication changes.  STOP TAKING: AmLODipine  (NORVASC )  START TAKING: Torsemide  20 MG by mouth daily  DECREASE: Toprol  XL  25 mg by mouth to once a daily   *If you need a refill on your cardiac medications before your next appointment, please call your pharmacy*  Lab Work:  Your provider would like for you to return in 2 WEEKS to have the following labs drawn: BMET.   Please go to [facility] [address] ([facility] [address], [place] [address] You do not need an appointment.  They are open from 8 am- 4:30 pm.  Lunch from 1:00 pm- 2:00 pm   If you have labs (blood work) drawn today and your tests are completely normal, you will receive your results only by: MyChart Message (if you have MyChart) OR A paper copy in the mail If you have any lab test that is abnormal or we need to change your treatment, we will call you to review the results.  Testing/Procedures:  No test ordered today   Follow-Up: At [facility], you and your health needs are our priority.  As part of our continuing mission to provide you with exceptional heart care, our providers are all part of one team.  This team includes your primary Cardiologist (physician) and Advanced Practice Providers or APPs (Physician Assistants and Nurse Practitioners) who all work together to provide you with the care you need, when you need it.  Your next appointment:   4 - 6  week(s)  Provider:   You may see Deatrice Cage, MD or one of the following Advanced Practice Providers on your designated Care Team:     Cadence [place], PA-C

## 2024-07-05 DIAGNOSIS — Z45018 Encounter for adjustment and management of other part of cardiac pacemaker: Secondary | ICD-10-CM | POA: Diagnosis not present

## 2024-07-05 DIAGNOSIS — C4A4 Merkel cell carcinoma of scalp and neck: Secondary | ICD-10-CM | POA: Diagnosis not present

## 2024-07-05 DIAGNOSIS — Z510 Encounter for antineoplastic radiation therapy: Secondary | ICD-10-CM | POA: Diagnosis not present

## 2024-07-06 ENCOUNTER — Telehealth: Payer: Self-pay

## 2024-07-06 NOTE — Telephone Encounter (Signed)
 Reviewed last OV note 07/04/24. Spoke to pt's husband, Helayne. He was unclear on recommended medication regimen, specifically the torsemide . He thought the torsemide  was increased. I explained that it was noted pt had stopped taking prior to appt, re-started this at 20mg  taking daily. I relayed med plan as stated in note (see below) and he verbalized understanding. We also discussed lab needed. Nothing further needed at this time.   STOP TAKING: AmLODipine  (NORVASC )   START TAKING: Torsemide  20 MG by mouth daily   DECREASE: Toprol  XL  25 mg by mouth to once a daily

## 2024-07-06 NOTE — Telephone Encounter (Signed)
 Pt husband called in stating they were told to up her dose of torsemide  and when they went to the pharmacy it was the exact same thing and they would like some clarity on this

## 2024-07-09 DIAGNOSIS — Z510 Encounter for antineoplastic radiation therapy: Secondary | ICD-10-CM | POA: Diagnosis not present

## 2024-07-09 DIAGNOSIS — C4A4 Merkel cell carcinoma of scalp and neck: Secondary | ICD-10-CM | POA: Diagnosis not present

## 2024-07-09 DIAGNOSIS — Z45018 Encounter for adjustment and management of other part of cardiac pacemaker: Secondary | ICD-10-CM | POA: Diagnosis not present

## 2024-07-10 DIAGNOSIS — C4A4 Merkel cell carcinoma of scalp and neck: Secondary | ICD-10-CM | POA: Diagnosis not present

## 2024-07-10 DIAGNOSIS — Z45018 Encounter for adjustment and management of other part of cardiac pacemaker: Secondary | ICD-10-CM | POA: Diagnosis not present

## 2024-07-10 DIAGNOSIS — Z510 Encounter for antineoplastic radiation therapy: Secondary | ICD-10-CM | POA: Diagnosis not present

## 2024-07-11 DIAGNOSIS — Z510 Encounter for antineoplastic radiation therapy: Secondary | ICD-10-CM | POA: Diagnosis not present

## 2024-07-11 DIAGNOSIS — C4A4 Merkel cell carcinoma of scalp and neck: Secondary | ICD-10-CM | POA: Diagnosis not present

## 2024-07-11 DIAGNOSIS — Z45018 Encounter for adjustment and management of other part of cardiac pacemaker: Secondary | ICD-10-CM | POA: Diagnosis not present

## 2024-07-12 ENCOUNTER — Ambulatory Visit: Payer: Medicare Other

## 2024-07-12 DIAGNOSIS — C4A4 Merkel cell carcinoma of scalp and neck: Secondary | ICD-10-CM | POA: Diagnosis not present

## 2024-07-12 DIAGNOSIS — I4821 Permanent atrial fibrillation: Secondary | ICD-10-CM | POA: Diagnosis not present

## 2024-07-12 DIAGNOSIS — Z510 Encounter for antineoplastic radiation therapy: Secondary | ICD-10-CM | POA: Diagnosis not present

## 2024-07-12 DIAGNOSIS — Z45018 Encounter for adjustment and management of other part of cardiac pacemaker: Secondary | ICD-10-CM | POA: Diagnosis not present

## 2024-07-13 DIAGNOSIS — Z45018 Encounter for adjustment and management of other part of cardiac pacemaker: Secondary | ICD-10-CM | POA: Diagnosis not present

## 2024-07-13 DIAGNOSIS — Z510 Encounter for antineoplastic radiation therapy: Secondary | ICD-10-CM | POA: Diagnosis not present

## 2024-07-13 DIAGNOSIS — C4A4 Merkel cell carcinoma of scalp and neck: Secondary | ICD-10-CM | POA: Diagnosis not present

## 2024-07-13 LAB — CUP PACEART REMOTE DEVICE CHECK
Battery Remaining Longevity: 112 mo
Battery Remaining Percentage: 90 %
Battery Voltage: 3.02 V
Brady Statistic RV Percent Paced: 99 %
Date Time Interrogation Session: 20250724020013
Implantable Lead Connection Status: 753985
Implantable Lead Implant Date: 20240424
Implantable Lead Location: 753860
Implantable Lead Model: 1948
Implantable Pulse Generator Implant Date: 20240424
Lead Channel Impedance Value: 650 Ohm
Lead Channel Pacing Threshold Amplitude: 0.75 V
Lead Channel Pacing Threshold Pulse Width: 0.5 ms
Lead Channel Sensing Intrinsic Amplitude: 9 mV
Lead Channel Setting Pacing Amplitude: 2.5 V
Lead Channel Setting Pacing Pulse Width: 0.5 ms
Lead Channel Setting Sensing Sensitivity: 2 mV
Pulse Gen Model: 1272
Pulse Gen Serial Number: 8124077

## 2024-07-16 DIAGNOSIS — Z510 Encounter for antineoplastic radiation therapy: Secondary | ICD-10-CM | POA: Diagnosis not present

## 2024-07-16 DIAGNOSIS — C4A4 Merkel cell carcinoma of scalp and neck: Secondary | ICD-10-CM | POA: Diagnosis not present

## 2024-07-16 DIAGNOSIS — Z45018 Encounter for adjustment and management of other part of cardiac pacemaker: Secondary | ICD-10-CM | POA: Diagnosis not present

## 2024-07-17 DIAGNOSIS — C4A4 Merkel cell carcinoma of scalp and neck: Secondary | ICD-10-CM | POA: Diagnosis not present

## 2024-07-17 DIAGNOSIS — Z510 Encounter for antineoplastic radiation therapy: Secondary | ICD-10-CM | POA: Diagnosis not present

## 2024-07-17 DIAGNOSIS — Z45018 Encounter for adjustment and management of other part of cardiac pacemaker: Secondary | ICD-10-CM | POA: Diagnosis not present

## 2024-07-18 DIAGNOSIS — Z510 Encounter for antineoplastic radiation therapy: Secondary | ICD-10-CM | POA: Diagnosis not present

## 2024-07-18 DIAGNOSIS — C4A4 Merkel cell carcinoma of scalp and neck: Secondary | ICD-10-CM | POA: Diagnosis not present

## 2024-07-18 DIAGNOSIS — Z45018 Encounter for adjustment and management of other part of cardiac pacemaker: Secondary | ICD-10-CM | POA: Diagnosis not present

## 2024-07-19 DIAGNOSIS — Z510 Encounter for antineoplastic radiation therapy: Secondary | ICD-10-CM | POA: Diagnosis not present

## 2024-07-19 DIAGNOSIS — C4A4 Merkel cell carcinoma of scalp and neck: Secondary | ICD-10-CM | POA: Diagnosis not present

## 2024-07-19 DIAGNOSIS — Z45018 Encounter for adjustment and management of other part of cardiac pacemaker: Secondary | ICD-10-CM | POA: Diagnosis not present

## 2024-07-19 DIAGNOSIS — Z79899 Other long term (current) drug therapy: Secondary | ICD-10-CM | POA: Diagnosis not present

## 2024-07-20 ENCOUNTER — Ambulatory Visit: Payer: Self-pay | Admitting: Medical

## 2024-07-20 DIAGNOSIS — Z950 Presence of cardiac pacemaker: Secondary | ICD-10-CM | POA: Diagnosis not present

## 2024-07-20 DIAGNOSIS — C4A4 Merkel cell carcinoma of scalp and neck: Secondary | ICD-10-CM | POA: Diagnosis not present

## 2024-07-20 DIAGNOSIS — E1122 Type 2 diabetes mellitus with diabetic chronic kidney disease: Secondary | ICD-10-CM | POA: Diagnosis not present

## 2024-07-20 DIAGNOSIS — E785 Hyperlipidemia, unspecified: Secondary | ICD-10-CM | POA: Diagnosis not present

## 2024-07-20 DIAGNOSIS — I129 Hypertensive chronic kidney disease with stage 1 through stage 4 chronic kidney disease, or unspecified chronic kidney disease: Secondary | ICD-10-CM | POA: Diagnosis not present

## 2024-07-20 DIAGNOSIS — N183 Chronic kidney disease, stage 3 unspecified: Secondary | ICD-10-CM | POA: Diagnosis not present

## 2024-07-20 LAB — BASIC METABOLIC PANEL WITH GFR
BUN/Creatinine Ratio: 14 (ref 12–28)
BUN: 13 mg/dL (ref 8–27)
CO2: 19 mmol/L — ABNORMAL LOW (ref 20–29)
Calcium: 9.2 mg/dL (ref 8.7–10.3)
Chloride: 99 mmol/L (ref 96–106)
Creatinine, Ser: 0.96 mg/dL (ref 0.57–1.00)
Glucose: 143 mg/dL — ABNORMAL HIGH (ref 70–99)
Potassium: 4.4 mmol/L (ref 3.5–5.2)
Sodium: 133 mmol/L — ABNORMAL LOW (ref 134–144)
eGFR: 58 mL/min/1.73 — ABNORMAL LOW (ref >59)

## 2024-07-23 DIAGNOSIS — Z950 Presence of cardiac pacemaker: Secondary | ICD-10-CM | POA: Diagnosis not present

## 2024-07-23 DIAGNOSIS — N183 Chronic kidney disease, stage 3 unspecified: Secondary | ICD-10-CM | POA: Diagnosis not present

## 2024-07-23 DIAGNOSIS — E1122 Type 2 diabetes mellitus with diabetic chronic kidney disease: Secondary | ICD-10-CM | POA: Diagnosis not present

## 2024-07-23 DIAGNOSIS — E785 Hyperlipidemia, unspecified: Secondary | ICD-10-CM | POA: Diagnosis not present

## 2024-07-23 DIAGNOSIS — C4A4 Merkel cell carcinoma of scalp and neck: Secondary | ICD-10-CM | POA: Diagnosis not present

## 2024-07-23 DIAGNOSIS — I129 Hypertensive chronic kidney disease with stage 1 through stage 4 chronic kidney disease, or unspecified chronic kidney disease: Secondary | ICD-10-CM | POA: Diagnosis not present

## 2024-07-24 DIAGNOSIS — Z950 Presence of cardiac pacemaker: Secondary | ICD-10-CM | POA: Diagnosis not present

## 2024-07-24 DIAGNOSIS — C4A4 Merkel cell carcinoma of scalp and neck: Secondary | ICD-10-CM | POA: Diagnosis not present

## 2024-07-24 DIAGNOSIS — E785 Hyperlipidemia, unspecified: Secondary | ICD-10-CM | POA: Diagnosis not present

## 2024-07-24 DIAGNOSIS — N183 Chronic kidney disease, stage 3 unspecified: Secondary | ICD-10-CM | POA: Diagnosis not present

## 2024-07-24 DIAGNOSIS — I129 Hypertensive chronic kidney disease with stage 1 through stage 4 chronic kidney disease, or unspecified chronic kidney disease: Secondary | ICD-10-CM | POA: Diagnosis not present

## 2024-07-24 DIAGNOSIS — E1122 Type 2 diabetes mellitus with diabetic chronic kidney disease: Secondary | ICD-10-CM | POA: Diagnosis not present

## 2024-07-25 DIAGNOSIS — Z950 Presence of cardiac pacemaker: Secondary | ICD-10-CM | POA: Diagnosis not present

## 2024-07-25 DIAGNOSIS — C4A4 Merkel cell carcinoma of scalp and neck: Secondary | ICD-10-CM | POA: Diagnosis not present

## 2024-07-25 DIAGNOSIS — N183 Chronic kidney disease, stage 3 unspecified: Secondary | ICD-10-CM | POA: Diagnosis not present

## 2024-07-25 DIAGNOSIS — I129 Hypertensive chronic kidney disease with stage 1 through stage 4 chronic kidney disease, or unspecified chronic kidney disease: Secondary | ICD-10-CM | POA: Diagnosis not present

## 2024-07-25 DIAGNOSIS — E785 Hyperlipidemia, unspecified: Secondary | ICD-10-CM | POA: Diagnosis not present

## 2024-07-25 DIAGNOSIS — E1122 Type 2 diabetes mellitus with diabetic chronic kidney disease: Secondary | ICD-10-CM | POA: Diagnosis not present

## 2024-07-25 NOTE — Progress Notes (Signed)
-------------------------------------------------------------------------------   Attestation signed by [place] Velma Hamilton, MD at 07/30/24 2101 I have reviewed and agree with the pacemaker interrogation as described.  Velma GORMAN [place] -------------------------------------------------------------------------------  Post RAD:

## 2024-07-26 DIAGNOSIS — I129 Hypertensive chronic kidney disease with stage 1 through stage 4 chronic kidney disease, or unspecified chronic kidney disease: Secondary | ICD-10-CM | POA: Diagnosis not present

## 2024-07-26 DIAGNOSIS — E1122 Type 2 diabetes mellitus with diabetic chronic kidney disease: Secondary | ICD-10-CM | POA: Diagnosis not present

## 2024-07-26 DIAGNOSIS — E785 Hyperlipidemia, unspecified: Secondary | ICD-10-CM | POA: Diagnosis not present

## 2024-07-26 DIAGNOSIS — C4A4 Merkel cell carcinoma of scalp and neck: Secondary | ICD-10-CM | POA: Diagnosis not present

## 2024-07-26 DIAGNOSIS — N183 Chronic kidney disease, stage 3 unspecified: Secondary | ICD-10-CM | POA: Diagnosis not present

## 2024-07-26 DIAGNOSIS — Z950 Presence of cardiac pacemaker: Secondary | ICD-10-CM | POA: Diagnosis not present

## 2024-07-27 DIAGNOSIS — Z950 Presence of cardiac pacemaker: Secondary | ICD-10-CM | POA: Diagnosis not present

## 2024-07-27 DIAGNOSIS — C4A4 Merkel cell carcinoma of scalp and neck: Secondary | ICD-10-CM | POA: Diagnosis not present

## 2024-07-27 DIAGNOSIS — N183 Chronic kidney disease, stage 3 unspecified: Secondary | ICD-10-CM | POA: Diagnosis not present

## 2024-07-27 DIAGNOSIS — E785 Hyperlipidemia, unspecified: Secondary | ICD-10-CM | POA: Diagnosis not present

## 2024-07-27 DIAGNOSIS — E1122 Type 2 diabetes mellitus with diabetic chronic kidney disease: Secondary | ICD-10-CM | POA: Diagnosis not present

## 2024-07-27 DIAGNOSIS — I129 Hypertensive chronic kidney disease with stage 1 through stage 4 chronic kidney disease, or unspecified chronic kidney disease: Secondary | ICD-10-CM | POA: Diagnosis not present

## 2024-07-30 DIAGNOSIS — Z950 Presence of cardiac pacemaker: Secondary | ICD-10-CM | POA: Diagnosis not present

## 2024-07-30 DIAGNOSIS — I129 Hypertensive chronic kidney disease with stage 1 through stage 4 chronic kidney disease, or unspecified chronic kidney disease: Secondary | ICD-10-CM | POA: Diagnosis not present

## 2024-07-30 DIAGNOSIS — C4A4 Merkel cell carcinoma of scalp and neck: Secondary | ICD-10-CM | POA: Diagnosis not present

## 2024-07-30 DIAGNOSIS — E1122 Type 2 diabetes mellitus with diabetic chronic kidney disease: Secondary | ICD-10-CM | POA: Diagnosis not present

## 2024-07-30 DIAGNOSIS — E785 Hyperlipidemia, unspecified: Secondary | ICD-10-CM | POA: Diagnosis not present

## 2024-07-30 DIAGNOSIS — N183 Chronic kidney disease, stage 3 unspecified: Secondary | ICD-10-CM | POA: Diagnosis not present

## 2024-07-31 ENCOUNTER — Other Ambulatory Visit: Payer: Self-pay | Admitting: Cardiovascular Disease

## 2024-07-31 ENCOUNTER — Other Ambulatory Visit: Payer: Self-pay | Admitting: Internal Medicine

## 2024-07-31 DIAGNOSIS — I10 Essential (primary) hypertension: Secondary | ICD-10-CM

## 2024-07-31 DIAGNOSIS — Z950 Presence of cardiac pacemaker: Secondary | ICD-10-CM | POA: Diagnosis not present

## 2024-07-31 DIAGNOSIS — E1122 Type 2 diabetes mellitus with diabetic chronic kidney disease: Secondary | ICD-10-CM | POA: Diagnosis not present

## 2024-07-31 DIAGNOSIS — E785 Hyperlipidemia, unspecified: Secondary | ICD-10-CM | POA: Diagnosis not present

## 2024-07-31 DIAGNOSIS — N183 Chronic kidney disease, stage 3 unspecified: Secondary | ICD-10-CM | POA: Diagnosis not present

## 2024-07-31 DIAGNOSIS — I129 Hypertensive chronic kidney disease with stage 1 through stage 4 chronic kidney disease, or unspecified chronic kidney disease: Secondary | ICD-10-CM | POA: Diagnosis not present

## 2024-07-31 DIAGNOSIS — I4821 Permanent atrial fibrillation: Secondary | ICD-10-CM

## 2024-07-31 DIAGNOSIS — C4A4 Merkel cell carcinoma of scalp and neck: Secondary | ICD-10-CM | POA: Diagnosis not present

## 2024-07-31 NOTE — Telephone Encounter (Signed)
 Pt last saw Cadence Furth, [place] on 07/04/24, last labs 06/27/24 Creat 0.90, age 88, weight 68.2kg, based on specified criteria pt is not on appropriate dosage of Eliquis .  Hx of SDH and falls per 06/21/24 office note.  Please advise if pt should remain on 2.5mg  BID or if needs dosage change.

## 2024-08-01 DIAGNOSIS — C4A4 Merkel cell carcinoma of scalp and neck: Secondary | ICD-10-CM | POA: Diagnosis not present

## 2024-08-01 DIAGNOSIS — Z950 Presence of cardiac pacemaker: Secondary | ICD-10-CM | POA: Diagnosis not present

## 2024-08-01 DIAGNOSIS — I129 Hypertensive chronic kidney disease with stage 1 through stage 4 chronic kidney disease, or unspecified chronic kidney disease: Secondary | ICD-10-CM | POA: Diagnosis not present

## 2024-08-01 DIAGNOSIS — N183 Chronic kidney disease, stage 3 unspecified: Secondary | ICD-10-CM | POA: Diagnosis not present

## 2024-08-01 DIAGNOSIS — E1122 Type 2 diabetes mellitus with diabetic chronic kidney disease: Secondary | ICD-10-CM | POA: Diagnosis not present

## 2024-08-01 DIAGNOSIS — E785 Hyperlipidemia, unspecified: Secondary | ICD-10-CM | POA: Diagnosis not present

## 2024-08-01 NOTE — Telephone Encounter (Signed)
 Pavero, Lonni, RPH to Cv Div Anticoagulation  (Selected Message)     07/31/24  5:20 PM Yes please increase to 5mg  BID

## 2024-08-01 NOTE — Telephone Encounter (Signed)
 Called spoke with pt's husband Helayne, per DPR of to do so, advised given pt's weight >60kg (68.2 kg) and serum creat <1.5 (creat 0.90 on 06/27/24) pt should be on Eliquis  5mg  BID given afib for stroke prevention.  Husband states they have an appt tomorrow 08/02/24 to see Cadence Furth, PA would like to discuss with her to make sure dosage increase is appropriate.  Pt does have hx of falls and SDH.  Will forward message to her to address at OV.

## 2024-08-02 ENCOUNTER — Ambulatory Visit: Attending: Medical | Admitting: Medical

## 2024-08-02 ENCOUNTER — Encounter: Payer: Self-pay | Admitting: Medical

## 2024-08-02 VITALS — BP 109/60 | HR 67 | Ht 63.0 in | Wt 130.4 lb

## 2024-08-02 DIAGNOSIS — I5032 Chronic diastolic (congestive) heart failure: Secondary | ICD-10-CM | POA: Diagnosis not present

## 2024-08-02 DIAGNOSIS — I10 Essential (primary) hypertension: Secondary | ICD-10-CM

## 2024-08-02 DIAGNOSIS — N183 Chronic kidney disease, stage 3 unspecified: Secondary | ICD-10-CM | POA: Diagnosis not present

## 2024-08-02 DIAGNOSIS — E782 Mixed hyperlipidemia: Secondary | ICD-10-CM | POA: Diagnosis not present

## 2024-08-02 DIAGNOSIS — I129 Hypertensive chronic kidney disease with stage 1 through stage 4 chronic kidney disease, or unspecified chronic kidney disease: Secondary | ICD-10-CM | POA: Diagnosis not present

## 2024-08-02 DIAGNOSIS — E785 Hyperlipidemia, unspecified: Secondary | ICD-10-CM | POA: Diagnosis not present

## 2024-08-02 DIAGNOSIS — E1122 Type 2 diabetes mellitus with diabetic chronic kidney disease: Secondary | ICD-10-CM | POA: Diagnosis not present

## 2024-08-02 DIAGNOSIS — Z950 Presence of cardiac pacemaker: Secondary | ICD-10-CM | POA: Diagnosis not present

## 2024-08-02 DIAGNOSIS — I4821 Permanent atrial fibrillation: Secondary | ICD-10-CM

## 2024-08-02 DIAGNOSIS — C4A4 Merkel cell carcinoma of scalp and neck: Secondary | ICD-10-CM | POA: Diagnosis not present

## 2024-08-02 NOTE — Patient Instructions (Signed)
 Medication Instructions:  Your physician has recommended you make the following change in your medication:   No changes at this time.   *If you need a refill on your cardiac medications before your next appointment, please call your pharmacy*  Lab Work: None  If you have labs (blood work) drawn today and your tests are completely normal, you will receive your results only by: MyChart Message (if you have MyChart) OR A paper copy in the mail If you have any lab test that is abnormal or we need to change your treatment, we will call you to review the results.  Testing/Procedures: None  Follow-Up: At [facility], you and your health needs are our priority.  As part of our continuing mission to provide you with exceptional heart care, our providers are all part of one team.  This team includes your primary Cardiologist (physician) and Advanced Practice Providers or APPs (Physician Assistants and Nurse Practitioners) who all work together to provide you with the care you need, when you need it.  Your next appointment:   August 23, 2024 at 3:40 pm  Provider:   Deatrice Cage, MD

## 2024-08-02 NOTE — Progress Notes (Signed)
 Cardiology Office Note   Date:  08/03/2024  ID:  EMILYNN SRINIVASAN, DOB 09-Jan-1936, MRN [id number] PCP: Marylynn Verneita CROME, MD  [facility] Providers Cardiologist:  Deatrice Cage, MD Electrophysiologist:  Elspeth Sage, MD   History of Present Illness [name] [name] is a 88 y.o. female with a hx of  permanent Afib/flutter, HTN, tachy-brady s/p PPM, HFimpEF, dementia who presents for follow-up of lower leg swelling.    She has a history of afib with multiple cardioversions. Now, she is rate controlled. She has a history of tachy-induced CM, but this has resolved. Cath in 2020 showed minimal irregularities with no evidence of obstructive CAD, normal LVEF and mildly elevated LVEDP.   She was hospitalized in April 2024 with syncope with prolonged pauses. She underwent single-lead pacemaker placement. Echo during admission showed normal LVSF with mid to moderate MR. She saw Dr. Cage 05/05/23 and felt very sluggish. HR was around 50bpm. Diltiazem  was discontinued and amlodipine  was added. She saw EP 05/24/23 and BP was elevated. She was started on lasix  for lower leg edema. Repeat limited echo showed LVEF 55-60%, severely dilated atrium, mild MR. In follow-up the patient was still volume up and she was switched to Torsemide .    She has had progressive bradycardia that required stopping diltiazem  and decreasing metoprolol .   She fell 05/2024 with SDH and Eliquis  was held. She saw neurosurgery 6/23 with stable head CT and Eliquis  was resumed 06/25/24.She was seen 06/21/24 reporting worsening lower leg edema despite lasix . Lasix  was stopped and was started on Torsemide . BNP came back at 534. TSH was high and was instructed to see PCP for this.   The patient was last seen 07/04/24 for lower leg edema. I recommended Torsemide  daily instead of as needed.  Today, the patient reports improved lower leg edema. She has been taking torsemide  every other day. Patient feels tired with radiation. 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 She denies chest pain or shortness of breath.   Studies Reviewed EKG Interpretation Date/Time:  Thursday August 02 2024 15:42:34 EDT Ventricular Rate:  67 PR Interval:    QRS Duration:  156 QT Interval:  450 QTC Calculation: 475 R Axis:   35  Text Interpretation: ventricular-paced complexes Right bundle branch block T wave abnormality, consider lateral ischemia When compared with ECG of 04-Jul-2024 13:40, Vent. rate has increased BY   7 BPM Confirmed by Franchester, Rumaldo Difatta ([id number]) on 08/02/2024 3:48:19 PM    Echo limited 06/06/23 1. Left ventricular ejection fraction, by estimation, is 55 to 60%. The  left ventricle has normal function.   2. Right ventricular systolic function is normal. The right ventricular  size is moderately enlarged. There is mildly elevated pulmonary artery  systolic pressure.   3. Left atrial size was severely dilated.   4. Right atrial size was severely dilated.   5. The mitral valve is normal in structure. Mild mitral valve  regurgitation.   6. Tricuspid valve regurgitation is moderate to severe.   7. The inferior vena cava is dilated in size with <50% respiratory  variability, suggesting right atrial pressure of 15 mmHg.    Echo 03/2023 1. Left ventricular ejection fraction, by estimation, is 55 to 60%. The  left ventricle has normal function. The left ventricle has no regional  wall motion abnormalities. Left ventricular diastolic parameters are  indeterminate.   2. Right ventricular systolic function is normal. The right ventricular  size is mildly enlarged. There is moderately  elevated pulmonary artery  systolic pressure. The estimated right ventricular systolic pressure is  51.8 mmHg.   3. Left atrial size was severely dilated.   4. Right atrial size was severely dilated.   5. The mitral valve is normal in structure. Mild to moderate mitral valve  regurgitation. No evidence of mitral stenosis.   6.  Tricuspid valve regurgitation is mild to moderate.   7. The aortic valve is normal in structure. Aortic valve regurgitation is  not visualized. No aortic stenosis is present.   8. The inferior vena cava is normal in size with greater than 50%  respiratory variability, suggesting right atrial pressure of 3 mmHg.    Myoview  lexiscan  08/2022 Narrative & Impression  Pharmacological myocardial perfusion imaging study with no significant  ischemia Normal wall motion, EF estimated at 57% No EKG changes concerning for ischemia at peak stress or in recovery. CT attenuation correction images with moderate aortic atherosclerosis, mild coronary calcification Low risk scan     Signed, Velinda Lunger, MD, Ph.D [facility]    LHC 2020  The left ventricular systolic function is normal. LV end diastolic pressure is mildly elevated. The left ventricular ejection fraction is 55-65% by visual estimate.   1.  Minimal irregularities with no evidence of obstructive coronary artery disease. 2.  Normal LV systolic function and mildly elevated left ventricular end-diastolic pressure.   Recommendations: The chest pain is likely noncardiac.  Continue rate control and anticoagulation for atrial fibrillation.  Can resume Eliquis  tomorrow morning.       Physical Exam VS:  BP 109/60 (BP Location: Left Arm, Patient Position: Sitting, Cuff Size: Normal)   Pulse 67   Ht 5' 3 (1.6 m)   Wt 130 lb 6.4 oz (59.1 kg)   SpO2 99%   BMI 23.10 kg/m        Wt Readings from Last 3 Encounters:  08/02/24 130 lb 6.4 oz (59.1 kg)  07/04/24 150 lb 4 oz (68.2 kg)  06/21/24 143 lb 6.4 oz (65 kg)    GEN: Well nourished, well developed in no acute distress NECK: No JVD; No carotid bruits CARDIAC: RRR, no murmurs, rubs, gallops RESPIRATORY:  Clear to auscultation without rales, wheezing or rhonchi  ABDOMEN: Soft, non-tender, non-distended EXTREMITIES:  pedal edema; No deformity   ASSESSMENT AND PLAN  HFimpEF H/o  tachy-induced CM Volume status improved with Torsemide . She is taking it every other day due to traveling daily to [facility] for radiation. She has pedal edema on exam. Weight has gone down 20 lbs. Continue torsemide  20mg  every other day.   HTN BP is good today. Continue toprol  25mg  daily.   Permanent Afib Continue Toprol  25mg  daily and amiodarone  200mg  daily. Continue Eliquis  2.5mg  BID (age and weight).   HLD LDL 70. Continue Pravastatin  20mg  daily.        Dispo: Follow-up in [place] next month she would like to keep.  Signed, Yancey Pedley VEAR Fishman, PA-C

## 2024-08-03 DIAGNOSIS — E785 Hyperlipidemia, unspecified: Secondary | ICD-10-CM | POA: Diagnosis not present

## 2024-08-03 DIAGNOSIS — C4A4 Merkel cell carcinoma of scalp and neck: Secondary | ICD-10-CM | POA: Diagnosis not present

## 2024-08-03 DIAGNOSIS — Z950 Presence of cardiac pacemaker: Secondary | ICD-10-CM | POA: Diagnosis not present

## 2024-08-03 DIAGNOSIS — I129 Hypertensive chronic kidney disease with stage 1 through stage 4 chronic kidney disease, or unspecified chronic kidney disease: Secondary | ICD-10-CM | POA: Diagnosis not present

## 2024-08-03 DIAGNOSIS — E1122 Type 2 diabetes mellitus with diabetic chronic kidney disease: Secondary | ICD-10-CM | POA: Diagnosis not present

## 2024-08-03 DIAGNOSIS — N183 Chronic kidney disease, stage 3 unspecified: Secondary | ICD-10-CM | POA: Diagnosis not present

## 2024-08-06 DIAGNOSIS — E785 Hyperlipidemia, unspecified: Secondary | ICD-10-CM | POA: Diagnosis not present

## 2024-08-06 DIAGNOSIS — E1122 Type 2 diabetes mellitus with diabetic chronic kidney disease: Secondary | ICD-10-CM | POA: Diagnosis not present

## 2024-08-06 DIAGNOSIS — C4A4 Merkel cell carcinoma of scalp and neck: Secondary | ICD-10-CM | POA: Diagnosis not present

## 2024-08-06 DIAGNOSIS — N183 Chronic kidney disease, stage 3 unspecified: Secondary | ICD-10-CM | POA: Diagnosis not present

## 2024-08-06 DIAGNOSIS — I129 Hypertensive chronic kidney disease with stage 1 through stage 4 chronic kidney disease, or unspecified chronic kidney disease: Secondary | ICD-10-CM | POA: Diagnosis not present

## 2024-08-06 DIAGNOSIS — Z950 Presence of cardiac pacemaker: Secondary | ICD-10-CM | POA: Diagnosis not present

## 2024-08-07 DIAGNOSIS — N183 Chronic kidney disease, stage 3 unspecified: Secondary | ICD-10-CM | POA: Diagnosis not present

## 2024-08-07 DIAGNOSIS — E785 Hyperlipidemia, unspecified: Secondary | ICD-10-CM | POA: Diagnosis not present

## 2024-08-07 DIAGNOSIS — C4A4 Merkel cell carcinoma of scalp and neck: Secondary | ICD-10-CM | POA: Diagnosis not present

## 2024-08-07 DIAGNOSIS — Z950 Presence of cardiac pacemaker: Secondary | ICD-10-CM | POA: Diagnosis not present

## 2024-08-07 DIAGNOSIS — E1122 Type 2 diabetes mellitus with diabetic chronic kidney disease: Secondary | ICD-10-CM | POA: Diagnosis not present

## 2024-08-07 DIAGNOSIS — I129 Hypertensive chronic kidney disease with stage 1 through stage 4 chronic kidney disease, or unspecified chronic kidney disease: Secondary | ICD-10-CM | POA: Diagnosis not present

## 2024-08-08 DIAGNOSIS — E1122 Type 2 diabetes mellitus with diabetic chronic kidney disease: Secondary | ICD-10-CM | POA: Diagnosis not present

## 2024-08-08 DIAGNOSIS — Z950 Presence of cardiac pacemaker: Secondary | ICD-10-CM | POA: Diagnosis not present

## 2024-08-08 DIAGNOSIS — C4A4 Merkel cell carcinoma of scalp and neck: Secondary | ICD-10-CM | POA: Diagnosis not present

## 2024-08-08 DIAGNOSIS — E785 Hyperlipidemia, unspecified: Secondary | ICD-10-CM | POA: Diagnosis not present

## 2024-08-08 DIAGNOSIS — N183 Chronic kidney disease, stage 3 unspecified: Secondary | ICD-10-CM | POA: Diagnosis not present

## 2024-08-08 DIAGNOSIS — I129 Hypertensive chronic kidney disease with stage 1 through stage 4 chronic kidney disease, or unspecified chronic kidney disease: Secondary | ICD-10-CM | POA: Diagnosis not present

## 2024-08-09 DIAGNOSIS — I129 Hypertensive chronic kidney disease with stage 1 through stage 4 chronic kidney disease, or unspecified chronic kidney disease: Secondary | ICD-10-CM | POA: Diagnosis not present

## 2024-08-09 DIAGNOSIS — E785 Hyperlipidemia, unspecified: Secondary | ICD-10-CM | POA: Diagnosis not present

## 2024-08-09 DIAGNOSIS — C4A4 Merkel cell carcinoma of scalp and neck: Secondary | ICD-10-CM | POA: Diagnosis not present

## 2024-08-09 DIAGNOSIS — Z950 Presence of cardiac pacemaker: Secondary | ICD-10-CM | POA: Diagnosis not present

## 2024-08-09 DIAGNOSIS — E1122 Type 2 diabetes mellitus with diabetic chronic kidney disease: Secondary | ICD-10-CM | POA: Diagnosis not present

## 2024-08-09 DIAGNOSIS — N183 Chronic kidney disease, stage 3 unspecified: Secondary | ICD-10-CM | POA: Diagnosis not present

## 2024-08-13 ENCOUNTER — Emergency Department
Admission: EM | Admit: 2024-08-13 | Discharge: 2024-08-13 | Disposition: A | Discharge status: Home / Self Care | Attending: Emergency Medicine | Admitting: Emergency Medicine

## 2024-08-13 ENCOUNTER — Other Ambulatory Visit: Payer: Self-pay

## 2024-08-13 ENCOUNTER — Emergency Department

## 2024-08-13 DIAGNOSIS — I110 Hypertensive heart disease with heart failure: Secondary | ICD-10-CM | POA: Insufficient documentation

## 2024-08-13 DIAGNOSIS — Z7901 Long term (current) use of anticoagulants: Secondary | ICD-10-CM | POA: Insufficient documentation

## 2024-08-13 DIAGNOSIS — S0990XA Unspecified injury of head, initial encounter: Secondary | ICD-10-CM | POA: Insufficient documentation

## 2024-08-13 DIAGNOSIS — I6782 Cerebral ischemia: Secondary | ICD-10-CM | POA: Diagnosis not present

## 2024-08-13 DIAGNOSIS — I672 Cerebral atherosclerosis: Secondary | ICD-10-CM | POA: Diagnosis not present

## 2024-08-13 DIAGNOSIS — I509 Heart failure, unspecified: Secondary | ICD-10-CM | POA: Insufficient documentation

## 2024-08-13 DIAGNOSIS — W1809XA Striking against other object with subsequent fall, initial encounter: Secondary | ICD-10-CM | POA: Diagnosis not present

## 2024-08-13 DIAGNOSIS — W19XXXA Unspecified fall, initial encounter: Secondary | ICD-10-CM

## 2024-08-13 DIAGNOSIS — F0390 Unspecified dementia without behavioral disturbance: Secondary | ICD-10-CM | POA: Diagnosis not present

## 2024-08-13 DIAGNOSIS — I6529 Occlusion and stenosis of unspecified carotid artery: Secondary | ICD-10-CM | POA: Diagnosis not present

## 2024-08-13 DIAGNOSIS — R519 Headache, unspecified: Secondary | ICD-10-CM | POA: Diagnosis not present

## 2024-08-13 NOTE — ED Triage Notes (Signed)
 Fell backward and hit back of head this morning. Mechanical fall. Uses walker, tripped when walking onto the carpet.

## 2024-08-13 NOTE — Discharge Instructions (Signed)
 You were seen in emergency department following a fall with head injury.  You had a CT scan done of your head and neck that did not show any internal bleeding or broken bones.  Continue to follow-up with your primary care physician.  Return to the emergency department for any worsening symptoms.  You can take Tylenol  as needed for pain control.

## 2024-08-13 NOTE — ED Notes (Signed)
 See triage note  Presents with family   Per husband she was getting something out of the closet   Turned  and fell hitting head on chair  No LOC

## 2024-08-13 NOTE — ED Triage Notes (Signed)
 Pt states fall today and hit her head. Pt states no loc. Pt states headache to front and back of head. Pt is on thinner. Pt has hx of dementia.

## 2024-08-13 NOTE — ED Provider Notes (Signed)
 [facility] Provider Note    Event Date/Time   First MD Initiated Contact with Patient 08/13/24 1412     (approximate)   History   Fall   HPI  [name] [name] is a 88 y.o. female past medical history significant for atrial fibrillation/flutter on Eliquis , hypertension, tachybradycardia syndrome, HFpEF, dementia, who presents to the emergency department following a fall.  History is provided by the patient's husband at bedside.  States that she normally ambulates with a walker and she has been having some weakness secondary to doing radiation.  Today was back in the room, turned around and fell, hitting the back of her head.  Is on anticoagulation so came into the emergency department for evaluation.  Patient without complaints at this time.  Denies chest pain, abdominal pain, nausea, vomiting or neck pain     Physical Exam   Triage Vital Signs: ED Triage Vitals  Encounter Vitals Group     BP 08/13/24 1241 (!) 115/45     Girls Systolic BP Percentile --      Girls Diastolic BP Percentile --      Boys Systolic BP Percentile --      Boys Diastolic BP Percentile --      Pulse Rate 08/13/24 1241 (!) 59     Resp 08/13/24 1241 18     Temp 08/13/24 1241 98 F (36.7 C)     Temp src --      SpO2 08/13/24 1241 99 %     Weight 08/13/24 1244 130 lb (59 kg)     Height 08/13/24 1244 5' 3 (1.6 m)     Head Circumference --      Peak Flow --      Pain Score 08/13/24 1242 6     Pain Loc --      Pain Education --      Exclude from Growth Chart --     Most recent vital signs: Vitals:   08/13/24 1241  BP: (!) 115/45  Pulse: (!) 59  Resp: 18  Temp: 98 F (36.7 C)  SpO2: 99%    Physical Exam Constitutional:      Appearance: She is well-developed.  HENT:     Head: Atraumatic.     Comments: Signs of radiation to the left scalp    Right Ear: External ear normal.     Left Ear: External ear normal.  Eyes:     Extraocular Movements: Extraocular movements  intact.     Conjunctiva/sclera: Conjunctivae normal.     Pupils: Pupils are equal, round, and reactive to light.  Cardiovascular:     Rate and Rhythm: Normal rate and regular rhythm.  Pulmonary:     Effort: No respiratory distress.  Abdominal:     General: There is no distension.  Musculoskeletal:        General: No tenderness. Normal range of motion.     Cervical back: Normal range of motion. No tenderness.     Comments: No midline thoracic, lumbar tenderness to palpation  Skin:    General: Skin is warm.  Neurological:     General: No focal deficit present.     Mental Status: She is alert. Mental status is at baseline.      IMPRESSION / MDM / ASSESSMENT AND PLAN / ED COURSE  I reviewed the triage vital signs and the nursing notes.  Differential diagnosis including intracranial hemorrhage, skull fracture, concussion, head injury, cervical spine injury   RADIOLOGY I  independently reviewed imaging, my interpretation of imaging: CT scan of the head with no signs of intracranial hemorrhage, CT scan of the cervical spine with no acute fracture  CT scan read as no acute findings.   Labs (all labs ordered are listed, but only abnormal results are displayed) Labs interpreted as -    Labs Reviewed - No data to display  CT scan with no acute fracture.  Patient is at mental status baseline.  Nonsyncopal fall.  Discussed close follow-up as an outpatient with primary care and return to the emergency department for any worsening symptoms.  No questions at time of discharge     PROCEDURES:  Critical Care performed: No  Procedures  Patient's presentation is most consistent with acute presentation with potential threat to life or bodily function.   MEDICATIONS ORDERED IN ED: Medications - No data to display  FINAL CLINICAL IMPRESSION(S) / ED DIAGNOSES   Final diagnoses:  Fall, initial encounter  Injury of head, initial encounter     Rx / DC Orders   ED Discharge  Orders     None        Note:  This document was prepared using Dragon voice recognition software and may include unintentional dictation errors.   Suzanne Kirsch, MD 08/13/24 1450

## 2024-08-14 ENCOUNTER — Encounter: Payer: Self-pay | Admitting: Podiatry

## 2024-08-14 ENCOUNTER — Ambulatory Visit: Admitting: Podiatry

## 2024-08-14 VITALS — Ht 63.0 in | Wt 130.0 lb

## 2024-08-14 DIAGNOSIS — B351 Tinea unguium: Secondary | ICD-10-CM

## 2024-08-14 DIAGNOSIS — M79674 Pain in right toe(s): Secondary | ICD-10-CM | POA: Diagnosis not present

## 2024-08-14 DIAGNOSIS — M79675 Pain in left toe(s): Secondary | ICD-10-CM | POA: Diagnosis not present

## 2024-08-14 NOTE — Progress Notes (Signed)
 Chief Complaint  Patient presents with   Nail Problem    Pt is here for RFC.    SUBJECTIVE Patient presents to office today complaining of elongated, thickened nails that cause pain while ambulating in shoes.  Patient is unable to trim their own nails. Patient is here for further evaluation and treatment.  Past Medical History:  Diagnosis Date   Actinic keratosis    Arthritis    Atrial flutter (HCC) 02/2011   s/p cardioversion    Chest pain    a. H/o cardiac cath x 2-> 2012 -->nl cors;  b. 12/2016 MV: EF 61%, small region of mild perfusion defect in the apical anteroseptal region c/w breast attenuation, no ischemia-->Low risk; c. 06/2019 MV: Mod size, mild inflat ischemia, EF 59%. Cor and Ao Ca2+. Inflat defect more pronounced on this study compared to last; c. 07/2019 Cath: LM nl, LAD min irregs, LCX nl, OM1/2/3 nl, RCA min irregs.   CKD (chronic kidney disease), stage III (HCC)    Concussion with no loss of consciousness 09/01/2016   Cystocele    Degenerative disorder of bone    Dementia (HCC)    GERD (gastroesophageal reflux disease)    Headache(784.0)    chronic   Hiatal hernia    Hyperlipidemia    Hypertension    Influenza 01/10/2017   Knee fracture    Migraines    Mobitz type 2 second degree atrioventricular block    a. felt to be 2/2 amiodarone , resolved with decreased amiodarone  dose.  Amio since d/c'd.   Permanent atrial fibrillation (HCC)    a. status post multiple DCCVs; b. 2018 - eval for PVI but opted for rate control.   PONV (postoperative nausea and vomiting)    oxycodone and codiene cause N/V    Pre-syncope    a. In setting of dehydration and AKI in the past.   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 Conway, plan from Radiation Oncologist is to start Radiation therapy   Squamous cell carcinoma of skin 11/10/2020   left distal posterior deltoid (EDC  01/15/2021)   Tachycardia induced cardiomyopathy (HCC)    a. Resolved;  b. 08/2017 Echo: EF 50-55%, no rwma, mild MR, mildly to mod dil LA/RA; c. 02/2018 Echo: EF 55-60%, mild MR. Mildly dil LA. Nl RVSP. PASP .   Venous insufficiency    Vertigo     Allergies  Allergen Reactions   Cyclobenzaprine Other (See Comments)    Dropped blood pressure and heart rate, talking out of my mind   Iodine  Anaphylaxis    NO PROBLEMS WITH BETADINE    Biaxin [Clarithromycin] Nausea And Vomiting   Codeine Nausea And Vomiting   Digoxin  And Related Other (See Comments)    Fatigue, eye puffiness, hoarsness   Diltiazem  Other (See Comments)   Enalapril Other (See Comments)    unknown   Famotidine      Caused dizziness   Fluocinonide Other (See Comments)    Tingling sensation in head and redness to scalp.   Iodinated Contrast Media Other (See Comments)    Tachycardia   Iodine -131    Losartan  Potassium Other (See Comments)    Dizzy and headache   Omnicef [Cefdinir]    Oxycodone Nausea And Vomiting   Promethazine  Other (See Comments)    Unknown    Sertraline      Dizziness and weakness   Tizanidine Hcl Other (See Comments)  hypotension    Warfarin And Related     Bleeding    Xarelto  [Rivaroxaban ] Other (See Comments)    bleeding     OBJECTIVE General Patient is awake, alert, and oriented x 3 and in no acute distress. Derm Skin is dry and supple bilateral. Negative open lesions or macerations. Remaining integument unremarkable. Nails are tender, long, thickened and dystrophic with subungual debris, consistent with onychomycosis, 1-5 bilateral. No signs of infection noted. Vasc  DP and PT pedal pulses palpable bilaterally. Temperature gradient within normal limits.  Neuro Epicritic and protective threshold sensation grossly intact bilaterally.  Musculoskeletal Exam No symptomatic pedal deformities noted bilateral. Muscular strength within normal limits.  ASSESSMENT 1.  Pain due to  onychomycosis of toenails both  PLAN OF CARE 1. Patient evaluated today.  2. Instructed to maintain good pedal hygiene and foot care.  3. Mechanical debridement of nails 1-5 bilaterally performed using a nail nipper. Filed with dremel without incident.  4. Return to clinic in 3 mos.    Thresa EMERSON Sar, DPM [facility]  Dr. Thresa EMERSON Sar, DPM    [address] [place], [place] [address]                Office [phone]  Fax [phone]

## 2024-08-23 ENCOUNTER — Ambulatory Visit: Admitting: Cardiovascular Disease

## 2024-08-24 ENCOUNTER — Ambulatory Visit: Attending: Cardiovascular Disease | Admitting: Cardiovascular Disease

## 2024-08-24 ENCOUNTER — Encounter: Payer: Self-pay | Admitting: Cardiovascular Disease

## 2024-08-24 VITALS — BP 126/58 | HR 60 | Ht 63.0 in | Wt 128.0 lb

## 2024-08-24 DIAGNOSIS — I5032 Chronic diastolic (congestive) heart failure: Secondary | ICD-10-CM

## 2024-08-24 DIAGNOSIS — I10 Essential (primary) hypertension: Secondary | ICD-10-CM | POA: Diagnosis not present

## 2024-08-24 DIAGNOSIS — I4821 Permanent atrial fibrillation: Secondary | ICD-10-CM | POA: Diagnosis not present

## 2024-08-24 DIAGNOSIS — E785 Hyperlipidemia, unspecified: Secondary | ICD-10-CM | POA: Diagnosis not present

## 2024-08-24 DIAGNOSIS — I779 Disorder of arteries and arterioles, unspecified: Secondary | ICD-10-CM | POA: Diagnosis not present

## 2024-08-24 NOTE — Patient Instructions (Signed)
 Medication Instructions:  No changes *If you need a refill on your cardiac medications before your next appointment, please call your pharmacy*  Lab Work: None ordered If you have labs (blood work) drawn today and your tests are completely normal, you will receive your results only by: MyChart Message (if you have MyChart) OR A paper copy in the mail If you have any lab test that is abnormal or we need to change your treatment, we will call you to review the results.  Testing/Procedures: None ordered  Follow-Up: At [facility], you and your health needs are our priority.  As part of our continuing mission to provide you with exceptional heart care, our providers are all part of one team.  This team includes your primary Cardiologist (physician) and Advanced Practice Providers or APPs (Physician Assistants and Nurse Practitioners) who all work together to provide you with the care you need, when you need it.  Your next appointment:   4 month(s)  Provider:   You may see Antionette Kirks, MD or one of the following Advanced Practice Providers on your designated Care Team:   Laneta Pintos, NP Gildardo Labrador, PA-C Varney Gentleman, PA-C Cadence Iola, PA-C Ronald Cockayne, NP Morey Ar, NP    We recommend signing up for the patient portal called "MyChart".  Sign up information is provided on this After Visit Summary.  MyChart is used to connect with patients for Virtual Visits (Telemedicine).  Patients are able to view lab/test results, encounter notes, upcoming appointments, etc.  Non-urgent messages can be sent to your provider as well.   To learn more about what you can do with MyChart, go to [email or web address].

## 2024-08-24 NOTE — Progress Notes (Signed)
 Cardiology Office Note   Date:  08/24/2024   ID:  [name] [name], DOB 1936/07/30, MRN [id number]  PCP:  Marylynn Verneita CROME, MD  Cardiologist:   Deatrice Cage, MD   Chief Complaint  Patient presents with   Follow-up    5 month f/u c/o weakness and low BP. Meds reviewed verbally with pt.       History of Present Illness: [name] [name] is a 88 y.o. female who presents for a followup visit regarding permanent atrial fibrillation.   She has known history of  atrial fibrillation/flutter status post multiple cardioversions in the past but currently with chronic atrial fibrillation being treated with rate control.  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    Most recent cardiac catheterization in 2020 showed minimal irregularities with no evidence of obstructive coronary artery disease, normal ejection fraction mildly elevated left ventricular end-diastolic pressure.  She was hospitalized in April, 2024 with syncope with prolonged pauses.  She underwent single-lead pacemaker placement by Dr. Fernande.  Echocardiogram during that admission showed normal LV systolic function with mild to moderate mitral regurgitation and mild to moderate tricuspid regurgitation.   Most recent echocardiogram in June 2024 showed normal LV systolic function, indeterminate diastolic function, mild pulmonary hypertension, mild mitral regurgitation, moderate to severe tricuspid regurgitation with severely dilated IVC.   She does have dementia and has been following up with neurology.   She was started on amiodarone  for rate control in April.  She has been doing reasonably well but has issues with intermittent falls most recently in August.  She denies chest pain or worsening dyspnea.   Past Medical History:  Diagnosis Date   Actinic keratosis    Arthritis    Atrial flutter (HCC) 02/2011   s/p cardioversion    Chest pain    a. H/o cardiac cath x 2-> 2012 -->nl cors;  b.  12/2016 MV: EF 61%, small region of mild perfusion defect in the apical anteroseptal region c/w breast attenuation, no ischemia-->Low risk; c. 06/2019 MV: Mod size, mild inflat ischemia, EF 59%. Cor and Ao Ca2+. Inflat defect more pronounced on this study compared to last; c. 07/2019 Cath: LM nl, LAD min irregs, LCX nl, OM1/2/3 nl, RCA min irregs.   CKD (chronic kidney disease), stage III (HCC)    Concussion with no loss of consciousness 09/01/2016   Cystocele    Degenerative disorder of bone    Dementia (HCC)    GERD (gastroesophageal reflux disease)    Headache(784.0)    chronic   Hiatal hernia    Hyperlipidemia    Hypertension    Influenza 01/10/2017   Knee fracture    Migraines    Mobitz type 2 second degree atrioventricular block    a. felt to be 2/2 amiodarone , resolved with decreased amiodarone  dose.  Amio since d/c'd.   Permanent atrial fibrillation (HCC)    a. status post multiple DCCVs; b. 2018 - eval for PVI but opted for rate control.   PONV (postoperative nausea and vomiting)    oxycodone and codiene cause N/V    Pre-syncope    a. In setting of dehydration and AKI in the past.   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 Conway, plan from Radiation Oncologist is to start Radiation therapy   Squamous cell carcinoma of skin 11/10/2020   left distal posterior deltoid (EDC  01/15/2021)   Tachycardia induced cardiomyopathy (HCC)    a. Resolved;  b. 08/2017 Echo: EF 50-55%, no rwma, mild MR, mildly to mod dil LA/RA; c. 02/2018 Echo: EF 55-60%, mild MR. Mildly dil LA. Nl RVSP. PASP .   Venous insufficiency    Vertigo     Past Surgical History:  Procedure Laterality Date   ABDOMINAL HYSTERECTOMY  1990   APPENDECTOMY     AUGMENTATION MAMMAPLASTY Bilateral 1986   implants   AUGMENTATION MAMMAPLASTY  1990   AUGMENTATION MAMMAPLASTY  2011   CARDIAC CATHETERIZATION      CARDIOVERSION     x 3   CARDIOVERSION     CARDIOVERSION N/A 02/07/2017   Procedure: CARDIOVERSION;  Surgeon: Deatrice DELENA Cage, MD;  Location: [facility] ORS;  Service: Cardiovascular;  Laterality: N/A;   CARDIOVERSION N/A 07/22/2017   Procedure: Cardioversion;  Surgeon: Perla Evalene PARAS, MD;  Location: [facility];  Service: Cardiovascular;  Laterality: N/A;   CATARACT EXTRACTION W/PHACO Right 09/21/2016   Procedure: CATARACT EXTRACTION PHACO AND INTRAOCULAR LENS PLACEMENT (IOC);  Surgeon: Elsie Carmine, MD;  Location: [facility];  Service: Ophthalmology;  Laterality: Right;  US  44.1 AP% 16.5 CDE 7.30 Fluid Pack Lot #[id number]   CATARACT EXTRACTION W/PHACO Left 10/19/2016   Procedure: CATARACT EXTRACTION PHACO AND INTRAOCULAR LENS PLACEMENT (IOC);  Surgeon: Elsie Carmine, MD;  Location: [facility];  Service: Ophthalmology;  Laterality: Left;  US  53.7 AP% 19.5 CDE 10.45 Fluid pack lot # [id number]   CHOLECYSTECTOMY     COMBINED AUGMENTATION MAMMAPLASTY AND ABDOMINOPLASTY     JOINT REPLACEMENT Left 06/04/2013   left knee   KNEE ARTHROSCOPY Right 08/16/2016   Procedure: ARTHROSCOPY KNEE, tear posterior horn medial meniscus, tear anterior and posterior horns of lateral meniscus, chondromalacia of lateral compartment grade 3 patella and grade 4 medial;  Surgeon: Lynwood SHAUNNA Hue, MD;  Location: [facility];  Service: Orthopedics;  Laterality: Right;   LEFT HEART CATH AND CORONARY ANGIOGRAPHY Left 07/27/2019   Procedure: LEFT HEART CATH AND CORONARY ANGIOGRAPHY;  Surgeon: Cage Deatrice DELENA, MD;  Location: [facility] IN[facility]ASIVE CV LAB;  Service: Cardiovascular;  Laterality: Left;   MASTECTOMY  1986   nipple sparing mastectomy/Bilateral with silicone  breast implants, s/p saline replacements   Multiple orthopedic procedures     NOSE SURGERY     PACEMAKER IMPLANT N/A 04/13/2023   Procedure: PACEMAKER IMPLANT;  Surgeon: Fernande Elspeth BROCKS, MD;  Location: [facility] INVAS[facility] CV [facility];  Service: Cardiovascular;  Laterality: N/A;   TEE  WITHOUT CARDIOVERSION N/A 09/26/2017   Procedure: TRANSESOPHAGEAL ECHOCARDIOGRAM (TEE);  Surgeon: Okey Vina GAILS, MD;  Location: [facility] ENDOSCOPY;  Service: Cardiovascular;  Laterality: N/A;   TOTAL KNEE ARTHROPLASTY Left      Current Outpatient Medications  Medication Sig Dispense Refill   acetaminophen  (TYLENOL ) 500 MG tablet Take 1,000 mg by mouth every 6 (six) hours as needed (pain).      amiodarone  (PACERONE ) 200 MG tablet Take 200 mg by mouth daily.     amoxicillin  (AMOXIL ) 500 MG capsule Take by mouth.     apixaban  (ELIQUIS ) 2.5 MG TABS tablet TAKE 1 TABLET BY MOUTH 2 TIMES DAILY. 180 tablet 3   Carboxymethylcellul-Glycerin (LUBRICATING EYE DROPS OP) Place 1 drop into both eyes daily as needed (dry eyes).     cholecalciferol  (VITAMIN D ) 1000 units tablet Take 1,000 Units by mouth daily.     cyanocobalamin  (VITAMIN B12) 500 MCG tablet Take 500 mcg by mouth daily.     donepezil  (ARICEPT  ODT) 10 MG  disintegrating tablet Take 1 tablet by mouth at bedtime.     mupirocin  ointment (BACTROBAN ) 2 % Apply 1 Application topically 2 (two) times daily. Apply to wound and cover with bandage until healed. 30 g 1   pantoprazole  (PROTONIX ) 40 MG tablet Take 1 tablet (40 mg total) by mouth daily. 30 tablet 3   potassium chloride  SA (KLOR-CON  M) 20 MEQ tablet Take 1 tablet (20 mEq total) by mouth daily. Take with torsemide  (Patient taking differently: Take 20 mEq by mouth every other day. Take with torsemide ) 90 tablet 3   pravastatin  (PRAVACHOL ) 20 MG tablet TAKE 1 TABLET BY MOUTH DAILY 90 tablet 2   sodium chloride  (OCEAN) 0.65 % SOLN nasal spray Place 1 spray into both nostrils daily as needed for congestion.     torsemide  (DEMADEX ) 20 MG tablet Take 1 tablet (20 mg total) by mouth daily. (Patient taking differently: Take 20 mg by mouth every other day.) 20 tablet 0   metoprolol  succinate (TOPROL -XL) 25 MG 24 hr tablet Take 1 tablet (25 mg total) by mouth daily. (Patient not taking: Reported on 08/24/2024) 90  tablet 2   No current facility-administered medications for this visit.    Allergies:   Cyclobenzaprine, Iodine , Biaxin [clarithromycin], Codeine, Digoxin  and related, Diltiazem , Enalapril, Famotidine , Fluocinonide, Iodinated contrast media, Iodine -131, Losartan  potassium, Omnicef [cefdinir], Oxycodone, Promethazine , Sertraline , Tizanidine hcl, Warfarin and related, and Xarelto  [rivaroxaban ]    Social History:  The patient  reports that she has never smoked. She has never used smokeless tobacco. She reports that she does not drink alcohol  and does not use drugs.   Family History:  The patient's family history includes Breast cancer in her maternal grandmother and sister; Breast cancer (age of onset: 41) in her sister; Breast cancer (age of onset: 60) in her sister; Breast cancer (age of onset: 24) in her sister; Diabetes in her brother; Esophageal cancer in her brother; Heart disease in her mother and son; Kidney failure in her brother; Stomach cancer in her father.    ROS:  Please see the history of present illness.   Otherwise, review of systems are positive for none.   All other systems are reviewed and negative.    PHYSICAL EXAM: VS:  BP (!) 126/58 (BP Location: Left Arm, Patient Position: Sitting, Cuff Size: Normal)   Pulse 60   Ht 5' 3 (1.6 m)   Wt 128 lb (58.1 kg)   SpO2 97%   BMI 22.67 kg/m  , BMI Body mass index is 22.67 kg/m. GEN: Well nourished, well developed, in no acute distress  HEENT: Intact Neck: no JVD, carotid bruits, or masses Cardiac: Irregularly irregular.; no murmurs, rubs, or gallops, mild bilateral leg edema worse on the left side  Respiratory:  clear to auscultation bilaterally, normal work of breathing GI: soft, nontender, nondistended, + BS MS: no deformity or atrophy  Skin: warm and dry, no rash Neuro:  Strength and sensation are intact Psych: She is not oriented.     EKG:  EKG is ordered today. The ekg ordered today demonstrates    Ventricular-paced rhythm When compared with ECG of 02-Aug-2024 15:42, Vent. rate has decreased BY   7 BPM       Recent Labs: 06/21/2024: BNP 534.8; TSH 12.200 06/27/2024: ALT 24; Hemoglobin 11.9; Platelets 203 07/19/2024: BUN 13; Creatinine, Ser 0.96; Potassium 4.4; Sodium 133    Lipid Panel    Component Value Date/Time   CHOL 118 02/29/2024 1508   TRIG 43.0 02/29/2024 1508  HDL 64.10 02/29/2024 1508   CHOLHDL 2 02/29/2024 1508   VLDL 8.6 02/29/2024 1508   LDLCALC 45 02/29/2024 1508   LDLDIRECT 43.0 02/29/2024 1508      Wt Readings from Last 3 Encounters:  08/24/24 128 lb (58.1 kg)  08/14/24 130 lb (59 kg)  08/13/24 130 lb (59 kg)       ASSESSMENT AND PLAN:  1.  Tachybradycardia syndrome status post pacemaker placement: Followed by our device clinic  2.  Permanent atrial fibrillation: Amiodarone  is being used for rate control in addition to small dose Toprol .  Ventricular rate is 60 bpm today.  Continue anticoagulation with low-dose Eliquis  2.5 mg twice daily considering her age and weight.  3. Essential hypertension: Amlodipine  was discontinued due to low blood pressure.  Her blood pressure is normal today.  4. History of tachycardia-induced cardiomyopathy.   Most recent echocardiogram showed normal LV systolic function.  5.  Carotid artery disease: Carotid Doppler in April of last showed moderate left carotid stenosis.  Given age and dementia, no need to repeat as she is unlikely to be a candidate for surgery.  6.  Hyperlipidemia: Continue treatment with pravastatin .  Most recent LDL was 70.  7.  Chronic diastolic heart failure: She appears to be euvolemic on small dose torsemide .  8.  Dementia: Followed by neurology.   Disposition:   FU in 4 months months.   Signed, Deatrice Cage, MD 08/24/24 [facility] [place], [place] [phone]

## 2024-08-30 DIAGNOSIS — M545 Low back pain, unspecified: Secondary | ICD-10-CM | POA: Diagnosis not present

## 2024-08-30 DIAGNOSIS — R296 Repeated falls: Secondary | ICD-10-CM | POA: Diagnosis not present

## 2024-09-05 DIAGNOSIS — I4821 Permanent atrial fibrillation: Secondary | ICD-10-CM | POA: Diagnosis not present

## 2024-09-05 DIAGNOSIS — Z9181 History of falling: Secondary | ICD-10-CM | POA: Diagnosis not present

## 2024-09-05 DIAGNOSIS — Z96652 Presence of left artificial knee joint: Secondary | ICD-10-CM | POA: Diagnosis not present

## 2024-09-05 DIAGNOSIS — M5116 Intervertebral disc disorders with radiculopathy, lumbar region: Secondary | ICD-10-CM | POA: Diagnosis not present

## 2024-09-05 DIAGNOSIS — Z9049 Acquired absence of other specified parts of digestive tract: Secondary | ICD-10-CM | POA: Diagnosis not present

## 2024-09-05 DIAGNOSIS — K449 Diaphragmatic hernia without obstruction or gangrene: Secondary | ICD-10-CM | POA: Diagnosis not present

## 2024-09-05 DIAGNOSIS — G4730 Sleep apnea, unspecified: Secondary | ICD-10-CM | POA: Diagnosis not present

## 2024-09-05 DIAGNOSIS — Z950 Presence of cardiac pacemaker: Secondary | ICD-10-CM | POA: Diagnosis not present

## 2024-09-05 DIAGNOSIS — K219 Gastro-esophageal reflux disease without esophagitis: Secondary | ICD-10-CM | POA: Diagnosis not present

## 2024-09-05 DIAGNOSIS — G43909 Migraine, unspecified, not intractable, without status migrainosus: Secondary | ICD-10-CM | POA: Diagnosis not present

## 2024-09-05 DIAGNOSIS — Z961 Presence of intraocular lens: Secondary | ICD-10-CM | POA: Diagnosis not present

## 2024-09-13 DIAGNOSIS — Z961 Presence of intraocular lens: Secondary | ICD-10-CM | POA: Diagnosis not present

## 2024-09-13 DIAGNOSIS — Z96652 Presence of left artificial knee joint: Secondary | ICD-10-CM | POA: Diagnosis not present

## 2024-09-13 DIAGNOSIS — K449 Diaphragmatic hernia without obstruction or gangrene: Secondary | ICD-10-CM | POA: Diagnosis not present

## 2024-09-13 DIAGNOSIS — G4730 Sleep apnea, unspecified: Secondary | ICD-10-CM | POA: Diagnosis not present

## 2024-09-13 DIAGNOSIS — E785 Hyperlipidemia, unspecified: Secondary | ICD-10-CM | POA: Diagnosis not present

## 2024-09-13 DIAGNOSIS — K219 Gastro-esophageal reflux disease without esophagitis: Secondary | ICD-10-CM | POA: Diagnosis not present

## 2024-09-13 DIAGNOSIS — Z950 Presence of cardiac pacemaker: Secondary | ICD-10-CM | POA: Diagnosis not present

## 2024-09-13 DIAGNOSIS — Z9049 Acquired absence of other specified parts of digestive tract: Secondary | ICD-10-CM | POA: Diagnosis not present

## 2024-09-13 DIAGNOSIS — Z9181 History of falling: Secondary | ICD-10-CM | POA: Diagnosis not present

## 2024-09-13 DIAGNOSIS — M5116 Intervertebral disc disorders with radiculopathy, lumbar region: Secondary | ICD-10-CM | POA: Diagnosis not present

## 2024-09-13 DIAGNOSIS — E538 Deficiency of other specified B group vitamins: Secondary | ICD-10-CM | POA: Diagnosis not present

## 2024-09-13 DIAGNOSIS — I4821 Permanent atrial fibrillation: Secondary | ICD-10-CM | POA: Diagnosis not present

## 2024-09-13 DIAGNOSIS — G43909 Migraine, unspecified, not intractable, without status migrainosus: Secondary | ICD-10-CM | POA: Diagnosis not present

## 2024-09-18 DIAGNOSIS — C4A9 Merkel cell carcinoma, unspecified: Secondary | ICD-10-CM | POA: Diagnosis not present

## 2024-09-20 NOTE — Progress Notes (Signed)
 Remote PPM Transmission

## 2024-09-21 ENCOUNTER — Ambulatory Visit: Admitting: Internal Medicine

## 2024-09-25 ENCOUNTER — Other Ambulatory Visit: Payer: Self-pay | Admitting: Cardiovascular Disease

## 2024-09-25 ENCOUNTER — Telehealth: Payer: Self-pay | Admitting: Cardiovascular Disease

## 2024-09-25 DIAGNOSIS — E1129 Type 2 diabetes mellitus with other diabetic kidney complication: Secondary | ICD-10-CM | POA: Diagnosis not present

## 2024-09-25 DIAGNOSIS — E876 Hypokalemia: Secondary | ICD-10-CM | POA: Diagnosis not present

## 2024-09-25 DIAGNOSIS — I10 Essential (primary) hypertension: Secondary | ICD-10-CM | POA: Diagnosis not present

## 2024-09-25 DIAGNOSIS — E785 Hyperlipidemia, unspecified: Secondary | ICD-10-CM | POA: Diagnosis not present

## 2024-09-25 DIAGNOSIS — E871 Hypo-osmolality and hyponatremia: Secondary | ICD-10-CM | POA: Diagnosis not present

## 2024-09-25 NOTE — Telephone Encounter (Signed)
 Pt spouse is calling to clarify if pt is suppose to take this medication ( amlodipine  ) when Bp is high. Medication is not currently on med list please Advise

## 2024-09-25 NOTE — Telephone Encounter (Signed)
 Talked with patient regarding medication instructions. Per last office visit note, patient amlodipine  was discontinued d/t low blood pressure. Patient verbalized understanding. No further questions at this time.

## 2024-09-26 DIAGNOSIS — C4A9 Merkel cell carcinoma, unspecified: Secondary | ICD-10-CM | POA: Diagnosis not present

## 2024-10-02 ENCOUNTER — Other Ambulatory Visit: Payer: Self-pay | Admitting: Medical

## 2024-10-02 DIAGNOSIS — I10 Essential (primary) hypertension: Secondary | ICD-10-CM | POA: Diagnosis not present

## 2024-10-02 DIAGNOSIS — E876 Hypokalemia: Secondary | ICD-10-CM | POA: Diagnosis not present

## 2024-10-02 DIAGNOSIS — E785 Hyperlipidemia, unspecified: Secondary | ICD-10-CM | POA: Diagnosis not present

## 2024-10-02 DIAGNOSIS — E1129 Type 2 diabetes mellitus with other diabetic kidney complication: Secondary | ICD-10-CM | POA: Diagnosis not present

## 2024-10-02 DIAGNOSIS — E871 Hypo-osmolality and hyponatremia: Secondary | ICD-10-CM | POA: Diagnosis not present

## 2024-10-08 ENCOUNTER — Other Ambulatory Visit: Payer: Self-pay

## 2024-10-08 ENCOUNTER — Emergency Department
Admission: EM | Admit: 2024-10-08 | Discharge: 2024-10-08 | Disposition: A | Discharge status: Home / Self Care | Attending: Emergency Medicine | Admitting: Emergency Medicine

## 2024-10-08 ENCOUNTER — Emergency Department

## 2024-10-08 DIAGNOSIS — I129 Hypertensive chronic kidney disease with stage 1 through stage 4 chronic kidney disease, or unspecified chronic kidney disease: Secondary | ICD-10-CM | POA: Insufficient documentation

## 2024-10-08 DIAGNOSIS — N189 Chronic kidney disease, unspecified: Secondary | ICD-10-CM | POA: Diagnosis not present

## 2024-10-08 DIAGNOSIS — Z7901 Long term (current) use of anticoagulants: Secondary | ICD-10-CM | POA: Insufficient documentation

## 2024-10-08 DIAGNOSIS — S0990XA Unspecified injury of head, initial encounter: Secondary | ICD-10-CM | POA: Insufficient documentation

## 2024-10-08 DIAGNOSIS — Z043 Encounter for examination and observation following other accident: Secondary | ICD-10-CM | POA: Diagnosis not present

## 2024-10-08 DIAGNOSIS — S51812A Laceration without foreign body of left forearm, initial encounter: Secondary | ICD-10-CM | POA: Diagnosis not present

## 2024-10-08 DIAGNOSIS — S0093XA Contusion of unspecified part of head, initial encounter: Secondary | ICD-10-CM | POA: Diagnosis not present

## 2024-10-08 DIAGNOSIS — W01198A Fall on same level from slipping, tripping and stumbling with subsequent striking against other object, initial encounter: Secondary | ICD-10-CM | POA: Diagnosis not present

## 2024-10-08 DIAGNOSIS — F0390 Unspecified dementia without behavioral disturbance: Secondary | ICD-10-CM | POA: Insufficient documentation

## 2024-10-08 DIAGNOSIS — W19XXXA Unspecified fall, initial encounter: Secondary | ICD-10-CM

## 2024-10-08 NOTE — ED Notes (Signed)
 Pt skin tears dressed with xeroform dressing, non-adherent pad and Kerlix.

## 2024-10-08 NOTE — ED Triage Notes (Signed)
 Pt presented to ED with c/o fall. States was getting up to go to bathroom when she tripped and fell. On Eliquis . Hit head, hematoma noted to back of head and skin tear noted to left forearm. Hx dementia

## 2024-10-08 NOTE — ED Notes (Signed)
 Pt discharged to home.  AVS reviewed with pt and husband.  Both parties verbalized understanding, no questions at this time.

## 2024-10-08 NOTE — ED Provider Notes (Signed)
 [facility] Provider Note    Event Date/Time   First MD Initiated Contact with Patient 10/08/24 0402     (approximate)  History   Chief Complaint: Fall  HPI  [name] [name] is a 88 y.o. female with a past medical history of CKD, gastric reflux, hypertension, hyperlipidemia, dementia, presents to the emergency department after a fall.  According to the husband he was awoken shortly after 3:00 with the patient yelling for him.  He found the patient on the floor with a bump on the back of her head consistent with a fall and head injury.  Patient has dementia and cannot contribute much to her history.  No distress.  Noted to have small occipital hematoma.  Physical Exam   Triage Vital Signs: ED Triage Vitals  Encounter Vitals Group     BP 10/08/24 0355 135/60     Girls Systolic BP Percentile --      Girls Diastolic BP Percentile --      Boys Systolic BP Percentile --      Boys Diastolic BP Percentile --      Pulse Rate 10/08/24 0355 64     Resp 10/08/24 0355 18     Temp 10/08/24 0355 97.8 F (36.6 C)     Temp Source 10/08/24 0355 Oral     SpO2 10/08/24 0355 100 %     Weight 10/08/24 0356 127 lb 13.9 oz (58 kg)     Height 10/08/24 0356 5' 3 (1.6 m)     Head Circumference --      Peak Flow --      Pain Score 10/08/24 0356 10     Pain Loc --      Pain Education --      Exclude from Growth Chart --     Most recent vital signs: Vitals:   10/08/24 0355  BP: 135/60  Pulse: 64  Resp: 18  Temp: 97.8 F (36.6 C)  SpO2: 100%    General: Awake, no distress.  Small to moderate occipital hematoma. CV:  Good peripheral perfusion.  Regular rate and rhythm  Resp:  Normal effort.  Equal breath sounds bilaterally.  Abd:  No distention.  Soft, nontender.  Other:  Small skin tear to left forearm.  ED Results / Procedures / Treatments   RADIOLOGY  I have reviewed interpreted CT head images but no obvious bleed seen on my evaluation. CT of the head and  neck read as negative for acute abnormality.  MEDICATIONS ORDERED IN ED: Medications - No data to display   IMPRESSION / MDM / ASSESSMENT AND PLAN / ED COURSE  I reviewed the triage vital signs and the nursing notes.  Patient's presentation is most consistent with acute presentation with potential threat to life or bodily function.  Patient presents emergency department for a fall, unwitnessed by the husband but with likely head injury.  Patient is on Eliquis  so has been brought her to the emergency department for evaluation.  Overall patient appears well, no distress.  Will obtain CT imaging of the head and C-spine as precaution.  Husband agreeable to plan of care.  He is wishing to go home as soon as possible.  CT scans are negative for acute abnormality.  Patient does have a small skin tear to the left forearm we will cover with Xeroform.  Good range of motion all extremities.  No other injuries on exam.  Will discharge home.  FINAL CLINICAL IMPRESSION(S) / ED DIAGNOSES  Fall Head injury   Note:  This document was prepared using Conservation officer, historic buildings and may include unintentional dictation errors.   Dorothyann Drivers, MD 10/08/24 [phone]

## 2024-10-08 NOTE — Discharge Instructions (Addendum)
 Your workup today has shown reassuring CT images.  Please keep the area of the left forearm bandage for the next 2 days after which you may take the bandage off cover with Neosporin and replace the Band-Aid daily until it heals.  Please follow-up with your primary care doctor in the next several days for recheck/reevaluation.  Return to the emergency department for any symptom concerning to yourselves.

## 2024-10-11 ENCOUNTER — Ambulatory Visit: Payer: Medicare Other

## 2024-10-11 DIAGNOSIS — I4821 Permanent atrial fibrillation: Secondary | ICD-10-CM | POA: Diagnosis not present

## 2024-10-11 LAB — CUP PACEART REMOTE DEVICE CHECK
Battery Remaining Longevity: 111 mo
Battery Remaining Percentage: 88 %
Battery Voltage: 3.02 V
Brady Statistic RV Percent Paced: 86 %
Date Time Interrogation Session: 20251023020013
Implantable Lead Connection Status: 753985
Implantable Lead Implant Date: 20240424
Implantable Lead Location: 753860
Implantable Lead Model: 1948
Implantable Pulse Generator Implant Date: 20240424
Lead Channel Impedance Value: 690 Ohm
Lead Channel Pacing Threshold Amplitude: 0.5 V
Lead Channel Pacing Threshold Pulse Width: 0.5 ms
Lead Channel Sensing Intrinsic Amplitude: 9.1 mV
Lead Channel Setting Pacing Amplitude: 2.5 V
Lead Channel Setting Pacing Pulse Width: 0.5 ms
Lead Channel Setting Sensing Sensitivity: 2 mV
Pulse Gen Model: 1272
Pulse Gen Serial Number: 8124077

## 2024-10-12 DIAGNOSIS — M1711 Unilateral primary osteoarthritis, right knee: Secondary | ICD-10-CM | POA: Diagnosis not present

## 2024-10-12 DIAGNOSIS — M25461 Effusion, right knee: Secondary | ICD-10-CM | POA: Diagnosis not present

## 2024-10-12 DIAGNOSIS — M25561 Pain in right knee: Secondary | ICD-10-CM | POA: Diagnosis not present

## 2024-10-12 NOTE — Progress Notes (Signed)
 Remote PPM Transmission

## 2024-10-13 ENCOUNTER — Ambulatory Visit: Payer: Self-pay | Admitting: Cardiology

## 2024-10-22 NOTE — Telephone Encounter (Signed)
 Spoke to pt and confirmed we are not changing the appt for Nov 12th with Dr Tommas as we didn't have earlier times available, I did offer Nov 5th but pt declined.

## 2024-10-24 ENCOUNTER — Encounter: Payer: Self-pay | Admitting: Internal Medicine

## 2024-10-24 ENCOUNTER — Ambulatory Visit (INDEPENDENT_AMBULATORY_CARE_PROVIDER_SITE_OTHER): Admitting: Internal Medicine

## 2024-10-24 VITALS — BP 124/60 | HR 69 | Ht 63.0 in | Wt 120.0 lb

## 2024-10-24 DIAGNOSIS — Z9882 Breast implant status: Secondary | ICD-10-CM

## 2024-10-24 DIAGNOSIS — F03B4 Unspecified dementia, moderate, with anxiety: Secondary | ICD-10-CM

## 2024-10-24 DIAGNOSIS — E119 Type 2 diabetes mellitus without complications: Secondary | ICD-10-CM

## 2024-10-24 DIAGNOSIS — Z9181 History of falling: Secondary | ICD-10-CM | POA: Diagnosis not present

## 2024-10-24 DIAGNOSIS — E1149 Type 2 diabetes mellitus with other diabetic neurological complication: Secondary | ICD-10-CM

## 2024-10-24 DIAGNOSIS — E7800 Pure hypercholesterolemia, unspecified: Secondary | ICD-10-CM | POA: Diagnosis not present

## 2024-10-24 DIAGNOSIS — I10 Essential (primary) hypertension: Secondary | ICD-10-CM | POA: Diagnosis not present

## 2024-10-24 DIAGNOSIS — I429 Cardiomyopathy, unspecified: Secondary | ICD-10-CM

## 2024-10-24 DIAGNOSIS — F32A Depression, unspecified: Secondary | ICD-10-CM

## 2024-10-24 DIAGNOSIS — Z23 Encounter for immunization: Secondary | ICD-10-CM | POA: Diagnosis not present

## 2024-10-24 DIAGNOSIS — D6859 Other primary thrombophilia: Secondary | ICD-10-CM

## 2024-10-24 DIAGNOSIS — I509 Heart failure, unspecified: Secondary | ICD-10-CM

## 2024-10-24 DIAGNOSIS — F419 Anxiety disorder, unspecified: Secondary | ICD-10-CM

## 2024-10-24 MED ORDER — PANTOPRAZOLE SODIUM 40 MG PO TBEC: 40.0000 mg | DELAYED_RELEASE_TABLET | Freq: Every day | ORAL | 1 refills | Status: DC

## 2024-10-24 NOTE — Progress Notes (Unsigned)
 Subjective:  Patient ID: [name] [name], female    DOB: 07-02-1936  Age: 88 y.o. MRN: [id number]  CC: The primary encounter diagnosis was Essential hypertension. Diagnoses of Type 2 diabetes mellitus with neurological complications (HCC), Pure hypercholesterolemia, History of recent fall, Need for influenza vaccination, Anxiety and depression, Congestive heart failure with cardiomyopathy (HCC), Moderate dementia with anxiety, unspecified dementia type (HCC), Diet-controlled diabetes mellitus (HCC), H/O bilateral breast implants, and Thrombophilia were also pertinent to this visit.   HPI [name] [name] presents for  Chief Complaint  Patient presents with   Medical Management of Chronic Issues    6 month follow up    Dementia,  balance issues.   [name] [name] is having recurrent falls.  She was last seen in  MAY AFTER HER 2ND FALL IN 2 MONTHS.  SHE HAS HAD 5 FALLS SINCE May,  ,  MOST RECENTLY OCT 20 .SHE WAS NOT USING HER WALKER WHEN SHE FELL.  HUSBAND STATES THAT  HE DID NOT SEE THE  PATIENT FALL,  BUT SHE CALLED OUT TO HIM   AFTER THE FALL .  Each fall has been documented by ER and head CT  has been done each time because she has presented with bruising and remains on Eliquis  for atrial fibrillation .  The fall in June resiulted in a subdural hematoma.   Patient's husband [name] [name]  remains more concerned about her nervousness and is requesting medication to calm her down.  He refuses to accept my advice that her Eliquis  needs to be discontinued .   Husband worried about her blood pressure being too low 120's/50's.  The metoprolol  has been stopped  by Dr Darron at recent visit    Outpatient Medications Prior to Visit  Medication Sig Dispense Refill   acetaminophen  (TYLENOL ) 500 MG tablet Take 1,000 mg by mouth every 6 (six) hours as needed (pain).      amiodarone  (PACERONE ) 200 MG tablet Take 200 mg by mouth daily.     Carboxymethylcellul-Glycerin (LUBRICATING EYE DROPS OP) Place 1 drop into  both eyes daily as needed (dry eyes).     cholecalciferol  (VITAMIN D ) 1000 units tablet Take 1,000 Units by mouth daily.     cyanocobalamin  (VITAMIN B12) 500 MCG tablet Take 500 mcg by mouth daily.     donepezil  (ARICEPT  ODT) 10 MG disintegrating tablet Take 1 tablet by mouth at bedtime.     metoprolol  succinate (TOPROL -XL) 25 MG 24 hr tablet Take 1 tablet (25 mg total) by mouth daily. 90 tablet 2   mupirocin  ointment (BACTROBAN ) 2 % Apply 1 Application topically 2 (two) times daily. Apply to wound and cover with bandage until healed. 30 g 1   potassium chloride  SA (KLOR-CON  M) 20 MEQ tablet Take 1 tablet (20 mEq total) by mouth daily. Take with torsemide  (Patient taking differently: Take 20 mEq by mouth every other day. Take with torsemide ) 90 tablet 3   pravastatin  (PRAVACHOL ) 20 MG tablet TAKE 1 TABLET BY MOUTH DAILY 90 tablet 2   sodium chloride  (OCEAN) 0.65 % SOLN nasal spray Place 1 spray into both nostrils daily as needed for congestion.     torsemide  (DEMADEX ) 20 MG tablet Take 1 tablet (20 mg total) by mouth daily. 90 tablet 3   apixaban  (ELIQUIS ) 2.5 MG TABS tablet TAKE 1 TABLET BY MOUTH 2 TIMES DAILY. 180 tablet 3   pantoprazole  (PROTONIX ) 40 MG tablet Take 1 tablet (40 mg total) by mouth daily. 30 tablet 3   amoxicillin  (AMOXIL )  500 MG capsule Take by mouth.     No facility-administered medications prior to visit.    Review of Systems;  Patient denies headache, fevers, malaise, unintentional weight loss, skin rash, eye pain, sinus congestion and sinus pain, sore throat, dysphagia,  hemoptysis , cough, dyspnea, wheezing, chest pain, palpitations, orthopnea, edema, abdominal pain, nausea, melena, diarrhea, constipation, flank pain, dysuria, hematuria, urinary  Frequency, nocturia, numbness, tingling, seizures,  Focal weakness, Loss of consciousness,  Tremor, insomnia, depression, anxiety, and suicidal ideation.      Objective:  BP 124/60   Pulse 69   Ht 5' 3 (1.6 m)   Wt 120 lb  (54.4 kg)   SpO2 97%   BMI 21.26 kg/m   BP Readings from Last 3 Encounters:  10/24/24 124/60  10/08/24 125/70  08/24/24 (!) 126/58    Wt Readings from Last 3 Encounters:  10/24/24 120 lb (54.4 kg)  10/08/24 127 lb 13.9 oz (58 kg)  08/24/24 128 lb (58.1 kg)    Physical Exam Vitals reviewed.  Constitutional:      General: She is not in acute distress.    Appearance: Normal appearance. She is normal weight. She is not ill-appearing, toxic-appearing or diaphoretic.  HENT:     Head: Normocephalic.      Comments: Surgical wound from skin excision .  Hair loss noted Eyes:     General: No scleral icterus.       Right eye: No discharge.        Left eye: No discharge.     Conjunctiva/sclera: Conjunctivae normal.  Cardiovascular:     Rate and Rhythm: Normal rate and regular rhythm.     Heart sounds: Normal heart sounds.  Pulmonary:     Effort: Pulmonary effort is normal. No respiratory distress.     Breath sounds: Normal breath sounds.  Musculoskeletal:        General: Normal range of motion.  Skin:    General: Skin is warm and dry.  Neurological:     General: No focal deficit present.     Mental Status: She is alert. Mental status is at baseline.     Motor: Tremor present.     Coordination: Heel to Shin Test abnormal.  Psychiatric:        Mood and Affect: Mood normal.        Behavior: Behavior normal.        Thought Content: Thought content normal.        Judgment: Judgment normal.     Lab Results  Component Value Date   HGBA1C 6.0 10/24/2024   HGBA1C 5.9 02/29/2024   HGBA1C 6.5 12/29/2022    Lab Results  Component Value Date   CREATININE 1.07 10/24/2024   CREATININE 0.96 07/19/2024   CREATININE 0.90 06/27/2024    Lab Results  Component Value Date   WBC 4.1 06/27/2024   HGB 11.9 (L) 06/27/2024   HCT 35.8 (L) 06/27/2024   PLT 203 06/27/2024   GLUCOSE 102 (H) 10/24/2024   CHOL 157 10/24/2024   TRIG 50.0 10/24/2024   HDL 79.90 10/24/2024   LDLDIRECT  59.0 10/24/2024   LDLCALC 67 10/24/2024   ALT 20 10/24/2024   AST 20 10/24/2024   NA 133 (L) 10/24/2024   K 4.2 10/24/2024   CL 96 10/24/2024   CREATININE 1.07 10/24/2024   BUN 19 10/24/2024   CO2 29 10/24/2024   TSH 12.200 (H) 06/21/2024   INR 1.3 02/12/2019   HGBA1C 6.0 10/24/2024   MICROALBUR 1.3  10/24/2024    CT CERVICAL SPINE WO CONTRAST Result Date: 10/08/2024 EXAM: CT CERVICAL SPINE WITHOUT CONTRAST 10/08/2024 05:59:11 AM TECHNIQUE: CT of the cervical spine was performed without the administration of intravenous contrast. Multiplanar reformatted images are provided for review. Automated exposure control, iterative reconstruction, and/or weight based adjustment of the mA/kV was utilized to reduce the radiation dose to as low as reasonably achievable. COMPARISON: CT of the cervical spine dated 08/13/2024. CLINICAL HISTORY: Pt presented to ED with c/o fall. States was getting up to go to bathroom when she tripped and fell. On Eliquis . Hit head, hematoma noted to back of head and skin tear noted to left forearm. Hx dementia. FINDINGS: CERVICAL SPINE: BONES AND ALIGNMENT: There is reversal of the normal cervical lordosis. No acute fracture or traumatic malalignment. DEGENERATIVE CHANGES: There is moderate chronic degenerative disc disease at C3-C4, C4-C5, C5-C6 and C6-C7. There is moderate central spinal canal stenosis at C4-C5 and C5-C6 and mild-to-moderate central spinal canal stenosis at C6-C7. There is moderate left neural foraminal stenosis at C4-C5 and C5-C6. There is diffuse bilateral facet arthrosis. SOFT TISSUES: No prevertebral soft tissue swelling. IMPRESSION: 1. No acute abnormality of the cervical spine related to the reported fall. 2. Moderate chronic degenerative disc disease at C3-4, C4-5, C5-6, and C6-7 with associated moderate central spinal canal stenosis at C4-5 and C5-6, mild-to-moderate central spinal canal stenosis at C6-7, and moderate left neural foraminal stenosis at  C4-5 and C5-6. Electronically signed by: Evalene Coho MD 10/08/2024 06:06 AM EDT RP Workstation: [id number]   CT HEAD WO CONTRAST Result Date: 10/08/2024 EXAM: CT HEAD WITHOUT CONTRAST 10/08/2024 05:59:11 AM TECHNIQUE: CT of the head was performed without the administration of intravenous contrast. Automated exposure control, iterative reconstruction, and/or weight based adjustment of the mA/kV was utilized to reduce the radiation dose to as low as reasonably achievable. COMPARISON: CT of the head dated 08/13/2024. CLINICAL HISTORY: Fall on thinners. Pt presented to ED with c/o fall. States was getting up to go to bathroom when she tripped and fell. On Eliquis . Hit head, hematoma noted to back of head and skin tear noted to left forearm. Hx dementia. FINDINGS: BRAIN AND VENTRICLES: No acute hemorrhage. No evidence of acute infarct. No hydrocephalus. No extra-axial collection. No mass effect or midline shift. There is age-related atrophy and mild cerebral white matter disease. ORBITS: No acute abnormality. Patient is status post bilateral lens replacement. SINUSES: No acute abnormality. SOFT TISSUES AND SKULL: No acute soft tissue abnormality. No skull fracture. IMPRESSION: 1. No acute intracranial abnormality. 2. Age-related atrophy and mild cerebral white matter disease. Electronically signed by: Evalene Coho MD 10/08/2024 06:02 AM EDT RP Workstation: [id number]    Assessment & Plan:  .Essential hypertension -     Comprehensive metabolic panel with GFR -     Microalbumin / creatinine urine ratio  Type 2 diabetes mellitus with neurological complications (HCC) Assessment & Plan: A1c is at goal without medications.  She has frequent falls due to loss of balance. She is using a walker..   Lab Results  Component Value Date   HGBA1C 6.0 10/24/2024   Lab Results  Component Value Date   MICROALBUR 1.3 10/24/2024   MICROALBUR 8.3 05/25/2017     Orders: -     Hemoglobin A1c -      Comprehensive metabolic panel with GFR -     Microalbumin / creatinine urine ratio  Pure hypercholesterolemia -     Lipid panel -     LDL cholesterol, direct  History of recent fall Assessment & Plan: Taheera has had 5 documented falls in 5 months,  with blunt head trauma and a subdural hematoma detected  on one occasion.  Her husband does not appear to understand the risk of an intracranial hemorrhage since she is taking Eliquis   despite numerous warnings.  I have spoken with cardiology and advised them of these events.  We are discontinuing eliquis  as of today .    Need for influenza vaccination -     Flu vaccine HIGH DOSE PF(Fluzone Trivalent)  Anxiety and depression Assessment & Plan: Aggravated by her cognitive decline due to dementia.  continue buspirone  starting with 10 mg twice daily .SABRA       Congestive heart failure with cardiomyopathy [facility]) Assessment & Plan: Secondary to severe tricuspid regurgitation and  biatrial dilation.  EF normal (June 2024 ECHO)  She has mild edema of lower extremities but clear lung exam..      Moderate dementia with anxiety, unspecified dementia type [facility]) Assessment & Plan: Husband would benefit from attending classes/seminars for caregivers of spouses with dementia bue he has declined.  He is requesting meals on wheels.    Will initiate referral    Diet-controlled diabetes mellitus (HCC) Assessment & Plan: Diet controlled.  No medication changes needed.   Lab Results  Component Value Date   HGBA1C 6.0 10/24/2024   Lab Results  Component Value Date   MICROALBUR 1.3 10/24/2024   MICROALBUR 8.3 05/25/2017          H/O bilateral breast implants Assessment & Plan: History of breast aspiration for abnormal mammogram reviewed.  Given her age and dementia , no further workup   Thrombophilia Assessment & Plan: Secondary to chronic atrial fib . However her risk of intracranial bleed now outweighs her risk of embolic CVA given her  history of recurrent falls (5 in 5 months with blunt head trauma nad prior subdural hematoma noted in June ) Eliquis  discontinued   Other orders -     Pantoprazole  Sodium; Take 1 tablet (40 mg total) by mouth daily.  Dispense: 90 tablet; Refill: 1    I personally spent a total of 34 minutes in the care of the patient today including getting/reviewing separately obtained history, performing a medically appropriate exam/evaluation, counseling and educating, placing orders, referring and communicating with other health care professionals, documenting clinical information in the EHR, communicating results, and coordinating care.  Follow-up: No follow-ups on file.   Verneita LITTIE Kettering, MD

## 2024-10-24 NOTE — Assessment & Plan Note (Signed)
 [name] [name] has had 5 documented falls in 5 months,  with blunt head trauma and a subdural hematoma detected  on one occasion.  Her husband does not appear to understand the risk of an intracranial hemorrhage since she is taking Eliquis   despite numerous warnings.  I have spoken with cardiology and advised them of these events.  We are discontinuing eliquis  as of today .

## 2024-10-24 NOTE — Patient Instructions (Addendum)
 [name] [name] SHOULD NOT BE TAKING ELIQUIS  BECAUSE SHE IS FALLING TOO OFTEN AND HITTING HER HEAD.   I CANNOT TREAT HER NERVOUSNESS' BECAUSE SHE IS FALLING ON A MONTHLY BASIS

## 2024-10-25 DIAGNOSIS — Z9882 Breast implant status: Secondary | ICD-10-CM | POA: Insufficient documentation

## 2024-10-25 LAB — COMPREHENSIVE METABOLIC PANEL WITH GFR
ALT: 20 U/L (ref 0–35)
AST: 20 U/L (ref 0–37)
Albumin: 4.1 g/dL (ref 3.5–5.2)
Alkaline Phosphatase: 52 U/L (ref 39–117)
BUN: 19 mg/dL (ref 6–23)
CO2: 29 meq/L (ref 19–32)
Calcium: 9.1 mg/dL (ref 8.4–10.5)
Chloride: 96 meq/L (ref 96–112)
Creatinine, Ser: 1.07 mg/dL (ref 0.40–1.20)
GFR: 46.95 mL/min — ABNORMAL LOW (ref >60.00)
Glucose, Bld: 102 mg/dL — ABNORMAL HIGH (ref 70–99)
Potassium: 4.2 meq/L (ref 3.5–5.1)
Sodium: 133 meq/L — ABNORMAL LOW (ref 135–145)
Total Bilirubin: 1.2 mg/dL (ref 0.2–1.2)
Total Protein: 6.6 g/dL (ref 6.0–8.3)

## 2024-10-25 LAB — LIPID PANEL
Cholesterol: 157 mg/dL (ref 0–200)
HDL: 79.9 mg/dL (ref >39.00)
LDL Cholesterol: 67 mg/dL (ref 0–99)
NonHDL: 77.48
Total CHOL/HDL Ratio: 2
Triglycerides: 50 mg/dL (ref 0.0–149.0)
VLDL: 10 mg/dL (ref 0.0–40.0)

## 2024-10-25 LAB — MICROALBUMIN / CREATININE URINE RATIO
Creatinine,U: 91 mg/dL
Microalb Creat Ratio: 14.5 mg/g (ref 0.0–30.0)
Microalb, Ur: 1.3 mg/dL (ref 0.0–1.9)

## 2024-10-25 LAB — LDL CHOLESTEROL, DIRECT: Direct LDL: 59 mg/dL

## 2024-10-25 LAB — HEMOGLOBIN A1C: Hgb A1c MFr Bld: 6 % (ref 4.6–6.5)

## 2024-10-25 NOTE — Assessment & Plan Note (Signed)
 A1c is at goal without medications.  She has frequent falls due to loss of balance. She is using a walker..   Lab Results  Component Value Date   HGBA1C 6.0 10/24/2024   Lab Results  Component Value Date   MICROALBUR 1.3 10/24/2024   MICROALBUR 8.3 05/25/2017

## 2024-10-25 NOTE — Assessment & Plan Note (Addendum)
 Secondary to chronic atrial fib . However her risk of intracranial bleed now outweighs her risk of embolic CVA given her history of recurrent falls (5 in 5 months with blunt head trauma nad prior subdural hematoma noted in June ) Eliquis  discontinued

## 2024-10-25 NOTE — Assessment & Plan Note (Signed)
 Aggravated by her cognitive decline due to dementia.  continue buspirone  starting with 10 mg twice daily .SABRA

## 2024-10-25 NOTE — Assessment & Plan Note (Signed)
 Husband would benefit from attending classes/seminars for caregivers of spouses with dementia bue he has declined.  He is requesting meals on wheels.    Will initiate referral

## 2024-10-25 NOTE — Assessment & Plan Note (Signed)
 Diet controlled.  No medication changes needed.   Lab Results  Component Value Date   HGBA1C 6.0 10/24/2024   Lab Results  Component Value Date   MICROALBUR 1.3 10/24/2024   MICROALBUR 8.3 05/25/2017

## 2024-10-25 NOTE — Assessment & Plan Note (Addendum)
 Secondary to severe tricuspid regurgitation and  biatrial dilation.  EF normal (June 2024 ECHO)  She has mild edema of lower extremities but clear lung exam..

## 2024-10-25 NOTE — Assessment & Plan Note (Signed)
 History of breast aspiration for abnormal mammogram reviewed.  Given her age and dementia , no further workup

## 2024-10-26 ENCOUNTER — Ambulatory Visit: Payer: Self-pay | Admitting: Internal Medicine

## 2024-10-26 NOTE — Telephone Encounter (Signed)
 Copied from CRM #[id number]. Topic: Clinical - Lab/Test Results >> Oct 26, 2024  1:27 PM Emylou G wrote: Reason for CRM: adv hubby of labs and to drink more water

## 2024-10-29 ENCOUNTER — Telehealth: Payer: Self-pay

## 2024-10-29 NOTE — Progress Notes (Unsigned)
 Complex Care Management Note Care Guide Note  10/29/2024 Name: [name] [name] MRN: [id number] DOB: Dec 09, 1936   Complex Care Management Outreach Attempts: An unsuccessful telephone outreach was attempted today to offer the patient information about available complex care management services.  Follow Up Plan:  Additional outreach attempts will be made to offer the patient complex care management information and services.   Encounter Outcome:  No Answer  [facility]  [facility], [facility] VBCI Assistant Direct Dial: [phone]  Fax: [phone]

## 2024-10-31 NOTE — Progress Notes (Unsigned)
 Complex Care Management Note Care Guide Note  10/31/2024 Name: [name] [name] MRN: [id number] DOB: June 17, 1936   Complex Care Management Outreach Attempts: A second unsuccessful outreach was attempted today to offer the patient with information about available complex care management services.  Follow Up Plan:  Additional outreach attempts will be made to offer the patient complex care management information and services.   Encounter Outcome:  Patient Request to Call Back  [facility]  [facility], [facility] VBCI Assistant Direct Dial: [phone]  Fax: [phone]

## 2024-11-02 NOTE — Progress Notes (Signed)
 Complex Care Management Note  Care Guide Note 11/02/2024 Name: [name] [name] MRN: [id number] DOB: 02/10/1936  [name] [name] is a 88 y.o. year old female who sees Marylynn, Verneita CROME, MD for primary care. I reached out to Heron DELENA Gaskins by phone today to offer complex care management services.  Ms. Daughdrill was given information about Complex Care Management services today including:   The Complex Care Management services include support from the care team which includes your Nurse Care Manager, Clinical Social Worker, or Pharmacist.  The Complex Care Management team is here to help remove barriers to the health concerns and goals most important to you. Complex Care Management services are voluntary, and the patient may decline or stop services at any time by request to their care team member.   Complex Care Management Consent Status: Patient agreed to services and verbal consent obtained.   Follow up plan:  Face to Face appointment with complex care management team member scheduled for:  11/27/24 at 11:00 a.m.   Encounter Outcome:  Patient Scheduled  [facility]  [facility], [facility] VBCI Assistant Direct Dial: [phone]  Fax: [phone]

## 2024-11-13 ENCOUNTER — Encounter: Payer: Self-pay | Admitting: Podiatry

## 2024-11-13 ENCOUNTER — Ambulatory Visit: Admitting: Podiatry

## 2024-11-13 VITALS — Ht 63.0 in | Wt 120.0 lb

## 2024-11-13 DIAGNOSIS — B351 Tinea unguium: Secondary | ICD-10-CM

## 2024-11-13 DIAGNOSIS — M79675 Pain in left toe(s): Secondary | ICD-10-CM

## 2024-11-13 DIAGNOSIS — M79674 Pain in right toe(s): Secondary | ICD-10-CM | POA: Diagnosis not present

## 2024-11-23 ENCOUNTER — Encounter: Payer: Self-pay | Admitting: Cardiovascular Disease

## 2024-11-23 ENCOUNTER — Ambulatory Visit: Attending: Cardiovascular Disease | Admitting: Cardiovascular Disease

## 2024-11-23 VITALS — BP 130/70 | HR 61 | Ht 63.0 in | Wt 128.0 lb

## 2024-11-23 DIAGNOSIS — I10 Essential (primary) hypertension: Secondary | ICD-10-CM | POA: Diagnosis not present

## 2024-11-23 DIAGNOSIS — I4821 Permanent atrial fibrillation: Secondary | ICD-10-CM

## 2024-11-23 DIAGNOSIS — I5032 Chronic diastolic (congestive) heart failure: Secondary | ICD-10-CM

## 2024-11-23 DIAGNOSIS — E785 Hyperlipidemia, unspecified: Secondary | ICD-10-CM

## 2024-11-23 DIAGNOSIS — Z79899 Other long term (current) drug therapy: Secondary | ICD-10-CM

## 2024-11-23 DIAGNOSIS — I779 Disorder of arteries and arterioles, unspecified: Secondary | ICD-10-CM

## 2024-11-23 NOTE — Progress Notes (Signed)
 Cardiology Office Note   Date:  11/23/2024   ID:  [name] [name], DOB [birth year], MRN [id number]  PCP:  Marylynn Verneita CROME, MD  Cardiologist:   Deatrice Cage, MD   Chief Complaint  Patient presents with   Follow-up    4 month f/u c/o chest discomfort. Meds reviewed verbally with pt.       History of Present Illness: [name] [name] is a 88 y.o. female who presents for a followup visit regarding permanent atrial fibrillation.   She has known history of  atrial fibrillation/flutter status post multiple cardioversions in the past but currently with chronic atrial fibrillation being treated with rate control.  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    Most recent cardiac catheterization in 2020 showed minimal irregularities with no evidence of obstructive coronary artery disease, normal ejection fraction mildly elevated left ventricular end-diastolic pressure.  She was hospitalized in April, 2024 with syncope with prolonged pauses.  She underwent single-lead pacemaker placement by Dr. Fernande.  Echocardiogram during that admission showed normal LV systolic function with mild to moderate mitral regurgitation and mild to moderate tricuspid regurgitation.  Most recent echocardiogram in June 2024 showed normal LV systolic function, indeterminate diastolic function, mild pulmonary hypertension, mild mitral regurgitation, moderate to severe tricuspid regurgitation with severely dilated IVC.   She has progressive dementia.  She had recurrent falls over the last 6 months with significant head trauma.  CT head in June showed evidence of acute subdural hematoma.  Last fall was in October.  Dr. Marylynn discussed this with me and we both agreed that anticoagulation should be stopped.  However, the husband was very concerned about the possibility of ischemic stroke and he continued Eliquis  2.5 mg twice daily.   Past Medical History:  Diagnosis Date   Actinic  keratosis    Arthritis    Atrial flutter (HCC) 02/2011   s/p cardioversion    Chest pain    a. H/o cardiac cath x 2-> 2012 -->nl cors;  b. 12/2016 MV: EF 61%, small region of mild perfusion defect in the apical anteroseptal region c/w breast attenuation, no ischemia-->Low risk; c. 06/2019 MV: Mod size, mild inflat ischemia, EF 59%. Cor and Ao Ca2+. Inflat defect more pronounced on this study compared to last; c. 07/2019 Cath: LM nl, LAD min irregs, LCX nl, OM1/2/3 nl, RCA min irregs.   CKD (chronic kidney disease), stage III (HCC)    Concussion with no loss of consciousness 09/01/2016   Cystocele    Degenerative disorder of bone    Dementia (HCC)    GERD (gastroesophageal reflux disease)    Headache(784.0)    chronic   Hiatal hernia    Hyperlipidemia    Hypertension    Influenza 01/10/2017   Knee fracture    Migraines    Mobitz type 2 second degree atrioventricular block    a. felt to be 2/2 amiodarone , resolved with decreased amiodarone  dose.  Amio since d/c'd.   Permanent atrial fibrillation (HCC)    a. status post multiple DCCVs; b. 2018 - eval for PVI but opted for rate control.   PONV (postoperative nausea and vomiting)    oxycodone and codiene cause N/V    Pre-syncope    a. In setting of dehydration and AKI in the past.   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 Conway, plan from Radiation  Oncologist is to start Radiation therapy. PET scan 05/31/24 No focal, avid cutaneous lesions are visualized. No evidence of FDG avid metastatic disease. The primary lesion may not be FDG avid.   Squamous cell carcinoma of skin 11/10/2020   left distal posterior deltoid (EDC 01/15/2021)   Tachycardia induced cardiomyopathy (HCC)    a. Resolved;  b. 08/2017 Echo: EF 50-55%, no rwma, mild MR, mildly to mod dil LA/RA; c. 02/2018 Echo: EF 55-60%, mild MR. Mildly dil LA. Nl RVSP. PASP .   Venous  insufficiency    Vertigo     Past Surgical History:  Procedure Laterality Date   ABDOMINAL HYSTERECTOMY  1990   APPENDECTOMY     AUGMENTATION MAMMAPLASTY Bilateral 1986   implants   AUGMENTATION MAMMAPLASTY  1990   AUGMENTATION MAMMAPLASTY  2011   CARDIAC CATHETERIZATION     CARDIOVERSION     x 3   CARDIOVERSION     CARDIOVERSION N/A 02/07/2017   Procedure: CARDIOVERSION;  Surgeon: Deatrice DELENA Cage, MD;  Location: [facility];  Service: Cardiovascular;  Laterality: N/A;   CARDIOVERSION N/A 07/22/2017   Procedure: Cardioversion;  Surgeon: Perla Evalene PARAS, MD;  Location: [facility];  Service: Cardiovascular;  Laterality: N/A;   CATARACT EXTRACTION W/PHACO Right 09/21/2016   Procedure: CATARACT EXTRACTION PHACO AND INTRAOCULAR LENS PLACEMENT (IOC);  Surgeon: Elsie Carmine, MD;  Location: [facility];  Service: Ophthalmology;  Laterality: Right;  US  44.1 AP% 16.5 CDE 7.30 Fluid Pack Lot #[id number]   CATARACT EXTRACTION W/PHACO Left 10/19/2016   Procedure: CATARACT EXTRACTION PHACO AND INTRAOCULAR LENS PLACEMENT (IOC);  Surgeon: Elsie Carmine, MD;  Location: [facility];  Service: Ophthalmology;  Laterality: Left;  US  53.7 AP% 19.5 CDE 10.45 Fluid pack lot # [id number]   CHOLECYSTECTOMY     COMBINED AUGMENTATION MAMMAPLASTY AND ABDOMINOPLASTY     JOINT REPLACEMENT Left 06/04/2013   left knee   KNEE ARTHROSCOPY Right 08/16/2016   Procedure: ARTHROSCOPY KNEE, tear posterior horn medial meniscus, tear anterior and posterior horns of lateral meniscus, chondromalacia of lateral compartment grade 3 patella and grade 4 medial;  Surgeon: Lynwood SHAUNNA Hue, MD;  Location: [facility];  Service: Orthopedics;  Laterality: Right;   LEFT HEART CATH AND CORONARY ANGIOGRAPHY Left 07/27/2019   Procedure: LEFT HEART CATH AND CORONARY ANGIOGRAPHY;  Surgeon: Cage Deatrice DELENA, MD;  Location: [facility];  Service: Cardiovascular;  Laterality: Left;   MASTECTOMY  1986   nipple sparing mastectomy/Bilateral with  silicone  breast implants, s/p saline replacements   Multiple orthopedic procedures     NOSE SURGERY     PACEMAKER IMPLANT N/A 04/13/2023   Procedure: PACEMAKER IMPLANT;  Surgeon: Fernande Elspeth BROCKS, MD;  Location: [facility];  Service: Cardiovascular;  Laterality: N/A;   TEE WITHOUT CARDIOVERSION N/A 09/26/2017   Procedure: TRANSESOPHAGEAL ECHOCARDIOGRAM (TEE);  Surgeon: Okey Vina GAILS, MD;  Location[facility];  Service: Cardiovascular;  Laterality: N/A;   TOTAL KNEE ARTHROPLASTY Left      Current Outpatient Medications  Medication Sig Dispense Refill   acetaminophen  (TYLENOL ) 500 MG tablet Take 1,000 mg by mouth every 6 (six) hours as needed (pain).      amiodarone  (PACERONE ) 200 MG tablet Take 200 mg by mouth daily.     Carboxymethylcellul-Glycerin (LUBRICATING EYE DROPS OP) Place 1 drop into both eyes daily as needed (dry eyes).     cholecalciferol  (VITAMIN D ) 1000 units tablet Take 1,000 Units by mouth daily.     cyanocobalamin  (VITAMIN B12) 500 MCG tablet  Take 500 mcg by mouth daily.     donepezil  (ARICEPT  ODT) 10 MG disintegrating tablet Take 1 tablet by mouth at bedtime.     ELIQUIS  2.5 MG TABS tablet Take 2.5 mg by mouth 2 (two) times daily.     metoprolol  succinate (TOPROL -XL) 25 MG 24 hr tablet Take 1 tablet (25 mg total) by mouth daily. 90 tablet 2   mupirocin  ointment (BACTROBAN ) 2 % Apply 1 Application topically 2 (two) times daily. Apply to wound and cover with bandage until healed. 30 g 1   pantoprazole  (PROTONIX ) 40 MG tablet Take 1 tablet (40 mg total) by mouth daily. 90 tablet 1   potassium chloride  SA (KLOR-CON  M) 20 MEQ tablet Take 1 tablet (20 mEq total) by mouth daily. Take with torsemide  (Patient taking differently: Take 20 mEq by mouth every other day. Take with torsemide ) 90 tablet 3   pravastatin  (PRAVACHOL ) 20 MG tablet TAKE 1 TABLET BY MOUTH DAILY 90 tablet 2   sodium chloride  (OCEAN) 0.65 % SOLN nasal spray Place 1 spray into both nostrils daily as  needed for congestion.     torsemide  (DEMADEX ) 20 MG tablet Take 1 tablet (20 mg total) by mouth daily. (Patient taking differently: Take 20 mg by mouth every other day.) 90 tablet 3   No current facility-administered medications for this visit.    Allergies:   Cyclobenzaprine, Eliquis  [apixaban ], Iodine , Biaxin [clarithromycin], Codeine, Digoxin  and related, Diltiazem , Enalapril, Famotidine , Fluocinonide, Iodinated contrast media, Iodine -131, Losartan  potassium, Omnicef [cefdinir], Oxycodone, Promethazine , Sertraline , Tizanidine, Tizanidine hcl, Warfarin and related, and Xarelto  [rivaroxaban ]    Social History:  The patient  reports that she has never smoked. She has never used smokeless tobacco. She reports that she does not drink alcohol  and does not use drugs.   Family History:  The patient's family history includes Breast cancer in her maternal grandmother and sister; Breast cancer (age of onset: 71) in her sister; Breast cancer (age of onset: 60) in her sister; Breast cancer (age of onset: 67) in her sister; Diabetes in her brother; Esophageal cancer in her brother; Heart disease in her mother and son; Kidney failure in her brother; Stomach cancer in her father.    ROS:  Please see the history of present illness.   Otherwise, review of systems are positive for none.   All other systems are reviewed and negative.    PHYSICAL EXAM: VS:  BP 130/70 (BP Location: Left Arm, Patient Position: Sitting, Cuff Size: Normal)   Pulse 61   Ht 5' 3 (1.6 m)   Wt 128 lb (58.1 kg)   SpO2 98%   BMI 22.67 kg/m  , BMI Body mass index is 22.67 kg/m. GEN: Well nourished, well developed, in no acute distress  HEENT: Intact Neck: no JVD, carotid bruits, or masses Cardiac: Irregularly irregular.; no murmurs, rubs, or gallops, mild bilateral leg edema worse on the left side  Respiratory:  clear to auscultation bilaterally, normal work of breathing GI: soft, nontender, nondistended, + BS MS: no deformity  or atrophy  Skin: warm and dry, no rash Neuro:  Strength and sensation are intact Psych: She is not oriented.     EKG:  EKG is ordered today. The ekg ordered today demonstrates   Ventricular-paced rhythm When compared with ECG of 02-Aug-2024 15:42, Vent. rate has decreased BY   7 BPM       Recent Labs: 06/21/2024: BNP 534.8; TSH 12.200 06/27/2024: Hemoglobin 11.9; Platelets 203 10/24/2024: ALT 20; BUN 19; Creatinine, Ser 1.07;  Potassium 4.2; Sodium 133    Lipid Panel    Component Value Date/Time   CHOL 157 10/24/2024 1456   TRIG 50.0 10/24/2024 1456   HDL 79.90 10/24/2024 1456   CHOLHDL 2 10/24/2024 1456   VLDL 10.0 10/24/2024 1456   LDLCALC 67 10/24/2024 1456   LDLDIRECT 59.0 10/24/2024 1456      Wt Readings from Last 3 Encounters:  11/23/24 128 lb (58.1 kg)  11/13/24 120 lb (54.4 kg)  10/24/24 120 lb (54.4 kg)       ASSESSMENT AND PLAN:  1.  Tachybradycardia syndrome status post pacemaker placement: Followed by our device clinic  2.  Permanent atrial fibrillation: Amiodarone  is being used for rate control .  I spent a prolonged discussion with the patient and her husband.  The patient herself is not able to make any decisions. The husband's concern is mostly an ischemic stroke that might leave her disabled.  He feels that this is the worst option then a fatal bleeding event.  In addition, he reports that they took some precautions to avoid falls and she has not had any falling since October.  He wants to continue anticoagulation but I explained to him that if she falls another time we will stop Eliquis  with no resumption.  3. Essential hypertension: She is off antihypertensive medications.  4. History of tachycardia-induced cardiomyopathy.   Most recent echocardiogram showed normal LV systolic function.  5.  Carotid artery disease: No need for surveillance.  6.  Hyperlipidemia: Continue treatment with pravastatin .  Most recent LDL was 70.  7.  Chronic diastolic  heart failure: She appears to be euvolemic on small dose torsemide .  8.  Dementia: Followed by neurology.  This seems to be progressive.   Disposition:   FU in 4 months months.   Signed, Deatrice Cage, MD 11/23/24 [facility] [place], [place] [phone]

## 2024-11-23 NOTE — Patient Instructions (Signed)
 Medication Instructions:  Your physician recommends that you continue on your current medications as directed. Please refer to the Current Medication list given to you today.   *If you need a refill on your cardiac medications before your next appointment, please call your pharmacy*  Lab Work: No labs ordered today  If you have labs (blood work) drawn today and your tests are completely normal, you will receive your results only by: MyChart Message (if you have MyChart) OR A paper copy in the mail If you have any lab test that is abnormal or we need to change your treatment, we will call you to review the results.  Testing/Procedures: No test ordered today   Follow-Up: At [facility], you and your health needs are our priority.  As part of our continuing mission to provide you with exceptional heart care, our providers are all part of one team.  This team includes your primary Cardiologist (physician) and Advanced Practice Providers or APPs (Physician Assistants and Nurse Practitioners) who all work together to provide you with the care you need, when you need it.  Your next appointment:   4 month(s)  Provider:   You may see Deatrice Cage, MD  or one of the following Advanced Practice Providers on your designated Care Team:   Lonni Meager, NP Lesley Maffucci, PA-C Bernardino Bring, PA-C Cadence Quincy, PA-C Tylene Lunch, NP Barnie Hila, NP     We recommend signing up for the patient portal called MyChart.  Sign up information is provided on this After Visit Summary.  MyChart is used to connect with patients for Virtual Visits (Telemedicine).  Patients are able to view lab/test results, encounter notes, upcoming appointments, etc.  Non-urgent messages can be sent to your provider as well.   To learn more about what you can do with MyChart, go to [email or web address].

## 2024-11-25 NOTE — Progress Notes (Signed)
 Chief Complaint  Patient presents with   Nail Problem    Pt is here for RFC.    SUBJECTIVE Patient presents to office today complaining of elongated, thickened nails that cause pain while ambulating in shoes.  Patient is unable to trim their own nails. Patient is here for further evaluation and treatment.  Past Medical History:  Diagnosis Date   Actinic keratosis    Arthritis    Atrial flutter (HCC) 02/2011   s/p cardioversion    Chest pain    a. H/o cardiac cath x 2-> 2012 -->nl cors;  b. 12/2016 MV: EF 61%, small region of mild perfusion defect in the apical anteroseptal region c/w breast attenuation, no ischemia-->Low risk; c. 06/2019 MV: Mod size, mild inflat ischemia, EF 59%. Cor and Ao Ca2+. Inflat defect more pronounced on this study compared to last; c. 07/2019 Cath: LM nl, LAD min irregs, LCX nl, OM1/2/3 nl, RCA min irregs.   CKD (chronic kidney disease), stage III (HCC)    Concussion with no loss of consciousness 09/01/2016   Cystocele    Degenerative disorder of bone    Dementia (HCC)    GERD (gastroesophageal reflux disease)    Headache(784.0)    chronic   Hiatal hernia    Hyperlipidemia    Hypertension    Influenza 01/10/2017   Knee fracture    Migraines    Mobitz type 2 second degree atrioventricular block    a. felt to be 2/2 amiodarone , resolved with decreased amiodarone  dose.  Amio since d/c'd.   Permanent atrial fibrillation (HCC)    a. status post multiple DCCVs; b. 2018 - eval for PVI but opted for rate control.   PONV (postoperative nausea and vomiting)    oxycodone and codiene cause N/V    Pre-syncope    a. In setting of dehydration and AKI in the past.   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 Conway, plan from Radiation Oncologist is to start Radiation therapy. PET scan 05/31/24 No focal, avid cutaneous lesions are visualized. No evidence of FDG avid  metastatic disease. The primary lesion may not be FDG avid.   Squamous cell carcinoma of skin 11/10/2020   left distal posterior deltoid (EDC 01/15/2021)   Tachycardia induced cardiomyopathy (HCC)    a. Resolved;  b. 08/2017 Echo: EF 50-55%, no rwma, mild MR, mildly to mod dil LA/RA; c. 02/2018 Echo: EF 55-60%, mild MR. Mildly dil LA. Nl RVSP. PASP .   Venous insufficiency    Vertigo     Allergies  Allergen Reactions   Cyclobenzaprine Other (See Comments)    Dropped blood pressure and heart rate, talking out of my mind   Eliquis  [Apixaban ] Other (See Comments)    RECURRENT FALLS,  WITH HEAD INJURIES (5 IN 5 MONTHS)   Iodine  Anaphylaxis    NO PROBLEMS WITH BETADINE    Biaxin [Clarithromycin] Nausea And Vomiting   Codeine Nausea And Vomiting   Digoxin  And Related Other (See Comments)    Fatigue, eye puffiness, hoarsness   Diltiazem  Other (See Comments)   Enalapril Other (See Comments)    unknown   Famotidine      Caused dizziness   Fluocinonide Other (See Comments)    Tingling sensation in head and redness to scalp.   Iodinated Contrast Media Other (See Comments)    Tachycardia   Iodine -131    Losartan  Potassium Other (See Comments)  Dizzy and headache   Omnicef [Cefdinir]    Oxycodone Nausea And Vomiting   Promethazine  Other (See Comments)    Unknown    Sertraline      Dizziness and weakness   Tizanidine Other (See Comments)    Other Reaction(s): Other (See Comments)    hypotension   Tizanidine Hcl Other (See Comments)    hypotension    Warfarin And Related     Bleeding    Xarelto  [Rivaroxaban ] Other (See Comments)    bleeding     OBJECTIVE General Patient is awake, alert, and oriented x 3 and in no acute distress. Derm Skin is dry and supple bilateral. Negative open lesions or macerations. Remaining integument unremarkable. Nails are tender, long, thickened and dystrophic with subungual debris, consistent with onychomycosis, 1-5 bilateral. No signs of  infection noted. Vasc  DP and PT pedal pulses palpable bilaterally. Temperature gradient within normal limits.  Neuro Epicritic and protective threshold sensation grossly intact bilaterally.  Musculoskeletal Exam No symptomatic pedal deformities noted bilateral. Muscular strength within normal limits.  ASSESSMENT 1.  Pain due to onychomycosis of toenails both  PLAN OF CARE 1. Patient evaluated today.  2. Instructed to maintain good pedal hygiene and foot care.  3. Mechanical debridement of nails 1-5 bilaterally performed using a nail nipper. Filed with dremel without incident.  4. Return to clinic in 3 mos.    Thresa EMERSON Sar, DPM [facility]  Dr. Thresa EMERSON Sar, DPM    [address] [place], [place] [address]                Office [phone]  Fax [phone]

## 2024-11-26 ENCOUNTER — Telehealth: Payer: Self-pay

## 2024-11-26 ENCOUNTER — Emergency Department

## 2024-11-26 ENCOUNTER — Emergency Department
Admission: EM | Admit: 2024-11-26 | Discharge: 2024-11-27 | Disposition: A | Attending: Emergency Medicine | Admitting: Emergency Medicine

## 2024-11-26 ENCOUNTER — Other Ambulatory Visit: Payer: Self-pay

## 2024-11-26 DIAGNOSIS — I10 Essential (primary) hypertension: Secondary | ICD-10-CM

## 2024-11-26 DIAGNOSIS — Z85828 Personal history of other malignant neoplasm of skin: Secondary | ICD-10-CM | POA: Diagnosis not present

## 2024-11-26 DIAGNOSIS — E871 Hypo-osmolality and hyponatremia: Secondary | ICD-10-CM | POA: Diagnosis not present

## 2024-11-26 DIAGNOSIS — Z950 Presence of cardiac pacemaker: Secondary | ICD-10-CM | POA: Diagnosis not present

## 2024-11-26 DIAGNOSIS — R8290 Unspecified abnormal findings in urine: Secondary | ICD-10-CM | POA: Insufficient documentation

## 2024-11-26 DIAGNOSIS — F0390 Unspecified dementia without behavioral disturbance: Secondary | ICD-10-CM | POA: Diagnosis not present

## 2024-11-26 DIAGNOSIS — S0990XA Unspecified injury of head, initial encounter: Secondary | ICD-10-CM | POA: Diagnosis present

## 2024-11-26 DIAGNOSIS — W228XXA Striking against or struck by other objects, initial encounter: Secondary | ICD-10-CM | POA: Diagnosis not present

## 2024-11-26 DIAGNOSIS — S060X0A Concussion without loss of consciousness, initial encounter: Secondary | ICD-10-CM

## 2024-11-26 DIAGNOSIS — I509 Heart failure, unspecified: Secondary | ICD-10-CM

## 2024-11-26 DIAGNOSIS — Z7901 Long term (current) use of anticoagulants: Secondary | ICD-10-CM | POA: Diagnosis not present

## 2024-11-26 DIAGNOSIS — S0081XA Abrasion of other part of head, initial encounter: Secondary | ICD-10-CM | POA: Diagnosis not present

## 2024-11-26 DIAGNOSIS — F03B4 Unspecified dementia, moderate, with anxiety: Secondary | ICD-10-CM

## 2024-11-26 LAB — CBC
HCT: 36.3 % (ref 36.0–46.0)
Hemoglobin: 12.1 g/dL (ref 12.0–15.0)
MCH: 34 pg (ref 26.0–34.0)
MCHC: 33.3 g/dL (ref 30.0–36.0)
MCV: 102 fL — ABNORMAL HIGH (ref 80.0–100.0)
Platelets: 214 K/uL (ref 150–400)
RBC: 3.56 MIL/uL — ABNORMAL LOW (ref 3.87–5.11)
RDW: 14.4 % (ref 11.5–15.5)
WBC: 5.7 K/uL (ref 4.0–10.5)
nRBC: 0 % (ref 0.0–0.2)

## 2024-11-26 LAB — COMPREHENSIVE METABOLIC PANEL WITH GFR
ALT: 24 U/L (ref 0–44)
AST: 32 U/L (ref 15–41)
Albumin: 4.2 g/dL (ref 3.5–5.0)
Alkaline Phosphatase: 65 U/L (ref 38–126)
Anion gap: 10 (ref 5–15)
BUN: 18 mg/dL (ref 8–23)
CO2: 24 mmol/L (ref 22–32)
Calcium: 9.3 mg/dL (ref 8.9–10.3)
Chloride: 95 mmol/L — ABNORMAL LOW (ref 98–111)
Creatinine, Ser: 1.01 mg/dL — ABNORMAL HIGH (ref 0.44–1.00)
GFR, Estimated: 54 mL/min — ABNORMAL LOW (ref >60)
Glucose, Bld: 100 mg/dL — ABNORMAL HIGH (ref 70–99)
Potassium: 4.6 mmol/L (ref 3.5–5.1)
Sodium: 130 mmol/L — ABNORMAL LOW (ref 135–145)
Total Bilirubin: 0.7 mg/dL (ref 0.0–1.2)
Total Protein: 6.7 g/dL (ref 6.5–8.1)

## 2024-11-26 NOTE — Telephone Encounter (Signed)
 Return call husband and he states he has spoken with since the message was routed to the provider. Patient states he thinks the lady's name Hadassah, but someone informed them, that they would give them a call back tomorrow 11 am. Patient states he was informed they would also discuss Home Health assistance as well. Patient was advised, if he does not hear back from anyone by tomorrow morning to give our office a call back. Patient states he is not hearing well these days, but he is writing all things down so that he can keep up with everything.

## 2024-11-26 NOTE — ED Provider Notes (Signed)
 [facility] Provider Note    Event Date/Time   First MD Initiated Contact with Patient 11/26/24 2202     (approximate)   History   Head Injury and Altered Mental Status  Patient arrived via EMS. HPI  [name] [name] is a 88 y.o. 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  Patient's husband stated that she bent over to pick something up and when she got back up she hit her head on a towel rack.  Was asking multiple questions earlier this morning so he wanted her to come over to the emergency department to get her checked out.  States that she has had radiation due to her squamous cell cancer in her head and that sometimes causes her to be weak and dizzy.  She is otherwise acting close to her normal self.  Patient without complaints and states that she just wants her husband with her.  Denies any headache, chest pain, abdominal pain, nausea, vomiting, dysuria, urinary urgency or frequency.     Physical Exam   Triage Vital Signs: ED Triage Vitals  Encounter Vitals Group     BP      Girls Systolic BP Percentile      Girls Diastolic BP Percentile      Boys Systolic BP Percentile      Boys Diastolic BP Percentile      Pulse      Resp      Temp      Temp src      SpO2      Weight      Height      Head Circumference      Peak Flow      Pain Score      Pain Loc      Pain Education      Exclude from Growth Chart     Most recent vital signs: Vitals:   11/26/24 2202 11/26/24 2207  Pulse:  60  Temp: 98.6 F (37 C)   SpO2:  98%    Physical Exam Constitutional:      Appearance: She is well-developed.  HENT:     Head:     Comments: Dermabond to the left side of the head.  Small abrasion to the right forehead Eyes:      Conjunctiva/sclera: Conjunctivae normal.  Cardiovascular:     Rate and Rhythm: Regular rhythm.  Pulmonary:     Effort: No respiratory distress.  Abdominal:     General: There is no distension.     Tenderness: There is no abdominal tenderness.  Musculoskeletal:        General: Normal range of motion.     Cervical back: Normal range of motion.     Right lower leg: No edema.     Left lower leg: No edema.  Skin:    General: Skin is warm.     Capillary Refill: Capillary refill takes less than 2 seconds.  Neurological:     Mental Status: She is alert. Mental status is at baseline.     IMPRESSION / MDM / ASSESSMENT AND PLAN / ED COURSE  I reviewed the triage vital signs and the nursing notes.  Differential diagnosis including intracranial hemorrhage, concussion, dehydration, electrolyte abnormality, heart failure exacerbation, urinary tract infection, polypharmacy   EKG  I,  Clotilda Punter, the attending physician, personally viewed and interpreted this ECG.  Paced rhythm.  Negative Sgarbossa's criteria.  QTc 506.  No significant change when compared to prior EKG.  No findings of acute ischemia or dysrhythmia  No tachycardic or bradycardic dysrhythmias while on cardiac telemetry.  RADIOLOGY CT scan of the head and cervical spine read as no acute findings  Chest x-ray with cardiomegaly -no signs of acute cardiopulmonary process  LABS (all labs ordered are listed, but only abnormal results are displayed) Labs interpreted as -    Labs Reviewed  CBC - Abnormal; Notable for the following components:      Result Value   RBC 3.56 (*)    MCV 102.0 (*)    All other components within normal limits  COMPREHENSIVE METABOLIC PANEL WITH GFR - Abnormal; Notable for the following components:   Sodium 130 (*)    Chloride 95 (*)    Glucose, Bld 100 (*)    Creatinine, Ser 1.01 (*)    GFR, Estimated 54 (*)    All other components within normal limits  URINALYSIS, W/ REFLEX TO  CULTURE (INFECTION SUSPECTED)     MDM  Lab work with mild chronic hyponatremia at 130 which appears to be close to her baseline of 133.  Creatinine is at her baseline.  No significant electrolyte abnormality.  Glucose was 100.  No significant leukocytosis or anemia, normal platelets.  Nonfocal neurologic exam and have a low suspicion for CVA  In-N-Out catheter for urine sample.  On chart review I do not see any history of a resistant urinary tract infection.  If urine appears infected we will start her on an antibiotic.  Will send the urine for culture.  If not infected plan to discharge home with close follow-up as an outpatient with her primary care physician and return precautions.  Patient's husband is at bedside and is in agreements of this plan.     PROCEDURES:  Critical Care performed: No  Procedures  Patient's presentation is most consistent with acute presentation with potential threat to life or bodily function.   MEDICATIONS ORDERED IN ED: Medications - No data to display  FINAL CLINICAL IMPRESSION(S) / ED DIAGNOSES   Final diagnoses:  Injury of head, initial encounter  Concussion without loss of consciousness, initial encounter     Rx / DC Orders   ED Discharge Orders     None        Note:  This document was prepared using Dragon voice recognition software and may include unintentional dictation errors.   Punter Clotilda, MD 11/26/24 2318

## 2024-11-26 NOTE — Telephone Encounter (Signed)
 Copied from CRM #[id number]. Topic: Referral - Question >> Nov 26, 2024 12:19 PM Alfonso HERO wrote: Reason for CRM: patient husband calling to see if they can get meals on wheels but doesn't know what to do to get.They also need some in home assistance because they can barely do for themselves and dont have anyone.

## 2024-11-26 NOTE — Addendum Note (Signed)
 Addended by: NICHOLAUS NATHANEL SAUNDERS on: 11/26/2024 04:14 PM   Modules accepted: Orders

## 2024-11-26 NOTE — Telephone Encounter (Addendum)
 I have pended a 2304 can I send high Priority? We can also call meals on Wheels.

## 2024-11-26 NOTE — ED Triage Notes (Signed)
 BIB [facility] from home with CC of AMS after hitting R side of head on towel rack yesterday without fall. Dementia at baseline - husband says after hitting head pt has been more confused and less steady on feet that normal. Husband also told EMS pt is prescribed Eliquis  but is noncompliant with all meds. Pt oriented to person, place, and situation. Disoriented to time.

## 2024-11-26 NOTE — ED Provider Notes (Signed)
 Urinalysis reviewed, few bacteria but otherwise no leukocytes no nitrites.  Will send for culture.  At this juncture does not have any compelling findings to suggest obvious UTI but if culture positive would consider treatment    ----------------------------------------- 1:15 AM on 11/27/2024 ----------------------------------------- Patient fully alert oriented.  Only concern at this time is that she is ready to go home her husband at the bedside ready to take her home.  In no distress.  Return precautions provided.  Well-appearing at this time  Vitals:   11/26/24 2207 11/26/24 2230  BP:  (!) 155/67  Pulse: 60 65  Resp:  15  Temp:    SpO2: 98% 97%      Dicky Anes, MD 11/27/24 0115

## 2024-11-27 ENCOUNTER — Telehealth: Payer: Self-pay | Admitting: *Deleted

## 2024-11-27 ENCOUNTER — Ambulatory Visit

## 2024-11-27 ENCOUNTER — Telehealth: Payer: Self-pay | Admitting: Internal Medicine

## 2024-11-27 ENCOUNTER — Telehealth

## 2024-11-27 LAB — URINALYSIS, W/ REFLEX TO CULTURE (INFECTION SUSPECTED)
Bilirubin Urine: NEGATIVE
Glucose, UA: NEGATIVE mg/dL
Hgb urine dipstick: NEGATIVE
Ketones, ur: NEGATIVE mg/dL
Leukocytes,Ua: NEGATIVE
Nitrite: NEGATIVE
Protein, ur: NEGATIVE mg/dL
Specific Gravity, Urine: 1.016 (ref 1.005–1.030)
pH: 6 (ref 5.0–8.0)

## 2024-11-27 MED ORDER — FOSFOMYCIN TROMETHAMINE 3 G PO PACK: 3.0000 g | PACK | ORAL | Status: DC

## 2024-11-27 NOTE — ED Notes (Signed)
Pt discharged with husband.

## 2024-11-27 NOTE — Telephone Encounter (Signed)
 Patients husband called and stated that he did receive a call from someone today and they have an appointment for December 16th.He was confused as to who he actually spoke with.

## 2024-11-27 NOTE — Telephone Encounter (Signed)
 Left a message for the patient to call back.   Per secure chat from Dr. Darron, She had another emergency room visit yesterday after she had her head. Please discontinue Eliquis . Please call the pharmacy and ask them not to give any more refills.

## 2024-11-27 NOTE — Telephone Encounter (Signed)
 Copied from CRM #[id number]. Topic: Referral - Question >> Nov 26, 2024 12:19 PM [name] [name] wrote: Reason for CRM: patient husband calling to see if they can get meals on wheels but doesn't know what to do to get.They also need some in home assistance because they can barely do for themselves and dont have anyone.

## 2024-11-27 NOTE — Telephone Encounter (Signed)
 Copied from CRM #[id number]. Topic: Referral - Question >> Nov 26, 2024 12:19 PM Alfonso HERO wrote: Reason for CRM: patient husband calling to see if they can get meals on wheels but doesn't know what to do to get.They also need some in home assistance because they can barely do for themselves and dont have anyone.

## 2024-11-27 NOTE — Telephone Encounter (Signed)
 LMTCB. Please find out if they received a phone call today to discuss the need for meals on wheels and home health services.

## 2024-11-28 LAB — URINE CULTURE: Culture: NO GROWTH

## 2024-11-28 NOTE — Telephone Encounter (Signed)
 The patient's husband has been made aware and verbalized his understanding. [facility] has been called and made aware that Eliquis  has been discontinued.

## 2024-12-04 ENCOUNTER — Telehealth: Payer: Self-pay

## 2024-12-04 ENCOUNTER — Other Ambulatory Visit

## 2024-12-04 VITALS — BP 128/68

## 2024-12-05 ENCOUNTER — Telehealth: Payer: Self-pay

## 2024-12-05 NOTE — Patient Outreach (Signed)
 Complex Care Management   Visit Note  12/05/2024  Name:  SEELEY SOUTHGATE MRN: [id number] DOB: Jul 03, 1936  Situation: Referral received for Complex Care Management related to Dementia and Fall Risk I obtained verbal consent from Caregiver Patient.  Visit completed with Caregiver Patient  in the office  Background:   Past Medical History:  Diagnosis Date   Actinic keratosis    Arthritis    Atrial flutter (HCC) 02/2011   s/p cardioversion    Chest pain    a. H/o cardiac cath x 2-> 2012 -->nl cors;  b. 12/2016 MV: EF 61%, small region of mild perfusion defect in the apical anteroseptal region c/w breast attenuation, no ischemia-->Low risk; c. 06/2019 MV: Mod size, mild inflat ischemia, EF 59%. Cor and Ao Ca2+. Inflat defect more pronounced on this study compared to last; c. 07/2019 Cath: LM nl, LAD min irregs, LCX nl, OM1/2/3 nl, RCA min irregs.   CKD (chronic kidney disease), stage III (HCC)    Concussion with no loss of consciousness 09/01/2016   Cystocele    Degenerative disorder of bone    Dementia (HCC)    GERD (gastroesophageal reflux disease)    Headache(784.0)    chronic   Hiatal hernia    Hyperlipidemia    Hypertension    Influenza 01/10/2017   Knee fracture    Migraines    Mobitz type 2 second degree atrioventricular block    a. felt to be 2/2 amiodarone , resolved with decreased amiodarone  dose.  Amio since d/c'd.   Permanent atrial fibrillation (HCC)    a. status post multiple DCCVs; b. 2018 - eval for PVI but opted for rate control.   PONV (postoperative nausea and vomiting)    oxycodone and codiene cause N/V    Pre-syncope    a. In setting of dehydration and AKI in the past.   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 Conway, plan from Radiation Oncologist is to start Radiation therapy. PET scan 05/31/24 No focal, avid cutaneous lesions are visualized. No evidence of FDG  avid metastatic disease. The primary lesion may not be FDG avid.   Squamous cell carcinoma of skin 11/10/2020   left distal posterior deltoid (EDC 01/15/2021)   Tachycardia induced cardiomyopathy (HCC)    a. Resolved;  b. 08/2017 Echo: EF 50-55%, no rwma, mild MR, mildly to mod dil LA/RA; c. 02/2018 Echo: EF 55-60%, mild MR. Mildly dil LA. Nl RVSP. PASP .   Venous insufficiency    Vertigo     Assessment: Patient Reported Symptoms:  Cognitive Cognitive Status: Able to follow simple commands, Confused or disoriented, Normal speech and language skills, Poor judgment in daily scenarios, Requires Assistance Decision Making, Struggling with memory recall (dementia, pleasant and answers when asked questions but not always related answers, repeating self throughout visit re issues of concern to her or things she wanted me to know) Cognitive/Intellectual Conditions Management [RPT]: None reported or documented in medical history or problem list   Health Maintenance Behaviors: Annual physical exam, Sleep adequate Healing Pattern: Slow Health Facilitated by: Rest  Neurological Neurological Review of Symptoms: Weakness Neurological Management Strategies: Routine screening Neurological Comment: weakness in legs - uses 2 wheeled walker, husband reports intention tremors both hands  HEENT HEENT Symptoms Reported: Nasal discharge HEENT Comment: occasional headaches, tylenol , clear runny nose - nasal sprays    Cardiovascular Cardiovascular Symptoms Reported: Swelling in legs or feet  Does patient have uncontrolled Hypertension?: No Cardiovascular Management Strategies: Medication therapy, Medical device Cardiovascular Comment: husband checks BP, today 128/68, usually around that or lower, swelling in BLE - torsemide  QOD, discussed compression socks, elevates at home, has Pacer, discussed healthy eating for heart health (fast food 2x day) - referring to [employer] as discussed and [employer] due to [employer]  waiting list  Respiratory Respiratory Symptoms Reported: No symptoms reported Respiratory Management Strategies: Routine screening  Endocrine Endocrine Symptoms Reported: Not assessed Is patient diabetic?: No Endocrine Comment: per husband - Dr Jaclynn said not diabetic and do not worry about it AIC 6.0  Gastrointestinal Gastrointestinal Symptoms Reported: Constipation Additional Gastrointestinal Details: occasional constipation managed with Miralax, appetite good per husband, eats fast food 2x daily as they do not cook, will refer for Meals on Wheels as discussed, do not eat many fruits/vegetables - encouraged,  sending diet info and will also refer to [employer] after discussion with LCSW      Genitourinary Genitourinary Symptoms Reported: Other Other Genitourinary Symptoms: up several times to use bathroom at night Additional Genitourinary Details: discussed sx of UTI to call provider including just feeling more tired, weak confused, encouraged hydration with water Genitourinary Management Strategies: Medication therapy  Integumentary Integumentary Symptoms Reported: Incision Additional Integumentary Details: had skin bx left temple healing no sx of infection, BLE dark with swelling no open areas Skin Management Strategies: Routine screening  Musculoskeletal Musculoskelatal Symptoms Reviewed: Difficulty walking, Joint pain, Limited mobility, Muscle pain, Unsteady gait, Weakness Additional Musculoskeletal Details: arthritis - right knee pain, for gel injection tomorrow, uses 2 wheeled walker, frequent falls, huband reports fall after tried to get out of high bed, feet went out from under her, aware needs to lower bed, and discussed getting nonslip socks Musculoskeletal Management Strategies: Medication therapy, Routine screening, Medical device Falls in the past year?: Yes Number of falls in past year: 2 or more Was there an injury with Fall?: Yes Fall Risk Category Calculator:  3 Patient Fall Risk Level: High Fall Risk Patient at Risk for Falls Due to: History of fall(s), Impaired balance/gait, Impaired mobility, Mental status change, Orthopedic patient Fall risk Follow up: Falls evaluation completed, Falls prevention discussed, Education provided  Psychosocial Psychosocial Symptoms Reported: Anxiety - if selected complete GAD Additional Psychological Details: patient became slightly teary discussing how she is fine unless husband is not there, patient declined Adult Day Services and husband agreed not interested thug would like someone in home to stay with her so she can do errands. Again discussed potential for family to assist for patient comfort due to dementia and husband will consider asking, reports do not have funds for private pay and switching from [facility] to [employer], discussed Palliative Care and agreed to referral, given number so husband will answer phone - issue with husband hearing when talking on phone, declined to have other family member take call to facilitate connection Behavioral Management Strategies: Adequate rest Behavioral Health Comment: pleasant, very attached to husband, anxiety when not there Major Change/Loss/Stressor/Fears (CP): Medical condition, self Quality of Family Relationships: supportive Do you feel physically threatened by others?: No    12/05/2024    PHQ2-9 Depression Screening   Little interest or pleasure in doing things Several days  Feeling down, depressed, or hopeless Several days  PHQ-2 - Total Score 2  Trouble falling or staying asleep, or sleeping too much More than half the days (up to use bathroom several times a night)  Feeling tired or having  little energy Several days  Poor appetite or overeating  Not at all  Feeling bad about yourself - or that you are a failure or have let yourself or your family down Not at all  Trouble concentrating on things, such as reading the newspaper or watching television  Nearly every day  Moving or speaking so slowly that other people could have noticed.  Or the opposite - being so fidgety or restless that you have been moving around a lot more than usual Not at all  Thoughts that you would be better off dead, or hurting yourself in some way Not at all  PHQ2-9 Total Score 8  If you checked off any problems, how difficult have these problems made it for you to do your work, take care of things at home, or get along with other people    Depression Interventions/Treatment      Today's Vitals   12/04/24 1151  BP: 128/68      Medications Reviewed Today     Reviewed by Devra Lands, RN (Registered Nurse) on 12/04/24 at 1130  Med List Status: <None>   Medication Order Taking? Sig Documenting Provider Last Dose Status Informant  acetaminophen  (TYLENOL ) 500 MG tablet [id number] Yes Take 1,000 mg by mouth every 6 (six) hours as needed (pain).  [provider]  Active Self  amiodarone  (PACERONE ) 200 MG tablet [id number] Yes Take 200 mg by mouth daily. [provider]  Active   Carboxymethylcellul-Glycerin (LUBRICATING EYE DROPS OP) [id number] Yes Place 1 drop into both eyes daily as needed (dry eyes). [provider]  Active Self  cholecalciferol  (VITAMIN D ) 1000 units tablet [id number] Yes Take 1,000 Units by mouth daily. [provider]  Active Self  cyanocobalamin  (VITAMIN B12) 500 MCG tablet [id number] Yes Take 500 mcg by mouth daily. [provider]  Active   donepezil  (ARICEPT  ODT) 10 MG disintegrating tablet [id number] Yes Take 1 tablet by mouth at bedtime. [provider]  Active   metoprolol  succinate (TOPROL -XL) 25 MG 24 hr tablet [id number]  Take 1 tablet (25 mg total) by mouth daily.  Patient not taking: Reported on 12/04/2024   Furth, Cadence H, PA-C  Active   mupirocin  ointment (BACTROBAN ) 2 % [id number] Yes Apply 1 Application topically 2 (two) times daily. Apply to wound and cover with bandage until  healed.  Patient taking differently: Apply 1 Application topically 2 (two) times daily. Apply to wound and cover with bandage until healed. As needed   Hester Alm BROCKS, MD  Active   pantoprazole  (PROTONIX ) 40 MG tablet [id number]  Take 1 tablet (40 mg total) by mouth daily.  Patient not taking: Reported on 12/04/2024   Tullo, Teresa L, MD  Active   potassium chloride  SA (KLOR-CON  M) 20 MEQ tablet [id number] Yes Take 1 tablet (20 mEq total) by mouth daily. Take with torsemide   Patient taking differently: Take 20 mEq by mouth every other day. Take with torsemide    Riddle, Suzann, NP  Active   pravastatin  (PRAVACHOL ) 20 MG tablet [id number] Yes TAKE 1 TABLET BY MOUTH DAILY Arida, Muhammad A, MD  Active   sodium chloride  (OCEAN) 0.65 % SOLN nasal spray [id number] Yes Place 1 spray into both nostrils daily as needed for congestion. [provider]  Active Self  torsemide  (DEMADEX ) 20 MG tablet [id number] Yes Take 1 tablet (20 mg total) by mouth daily.  Patient taking differently: Take 20 mg by mouth every other day.   Darron Deatrice LABOR, MD  Active  Recommendation:   PCP Follow-up Continue Current Plan of Care Meet with [facility] and [facility] [employer] Wheels  Follow Up Plan:   Face to Face follow up 2 Marcello Tuzzolino  Nestora Duos, MSN, RN [facility], [facility] RN Care Manager Direct Dial: [phone] Fax: [phone]

## 2024-12-05 NOTE — Patient Instructions (Signed)
 Visit Information  Thank you for taking time to visit with me today. Please don't hesitate to contact me if I can be of assistance to you before our next scheduled appointment.  Our next appointment is in-person at Dr [facility] office on 12/08/2024 at 11:00 am Please call the care guide team at [phone] if you need to cancel or reschedule your appointment.   Following is a copy of your care plan:   Goals Addressed             This Visit's Progress    [facility] RN Care Plan - Dementia/Fall Risk       Problems:  Chronic Disease Management support and education needs related to Dementia and Fall Risk  Goal: Over the next 90 days the Caregiver Patient will attend all scheduled medical appointments: Patient will attend all appointments as evidenced by chart review        continue to work with RN Care Manager and/or Social Worker to address care management and care coordination needs related to Dementia and Fall Risk as evidenced by adherence to care management team scheduled appointments     take all medications exactly as prescribed and will call provider for medication related questions as evidenced by husband report    verbalize basic understanding of Dementia and Fall Risk disease process and self health management plan as evidenced by taking all medications as prescribed, practicing fall precautions, reporting falls promptly to provider and get eval for injury/new or worsening pain after fall, ED for head bumps, reaching out to family to discuss current situation and determine if available to sit with patient while husband runs errands, meet with [facility] to determine services, meet with [facility] for application, read education provided and ask questions as needed, report changes/new symptoms or concerns to provider promptly. .   Interventions:   Falls Interventions: Provided written and verbal education re: potential causes of falls and Fall prevention  strategies Reviewed medications and discussed potential side effects of medications such as dizziness and frequent urination Advised patient of importance of notifying provider of falls Assessed for signs and symptoms of orthostatic hypotension Assessed for falls since last encounter Assessed patients knowledge of fall risk prevention secondary to previously provided education Screening for signs and symptoms of depression related to chronic disease state  Assessed social determinant of health barriers - referral to [facility] - patient and husband eating fast food 2x day due to not cooking Discussed using non-slip socks at night, walker at all times Discussed ED for head bumps, provider eval for injury/increased or new pain following fall.   Dementia: Evaluation of current treatment plan related to: Dementia with anxiety Made referral to Palliative Care and [facility],  Reviewed medications including  Depression screen completed,  Emotional Support Provided to patient/caregiver,  Sleep hygiene recommendations and education provided,  Consideration of in-home help encouraged, including discussion with family members who have offered to assist as needed Discussed safety issues related to dementia Discussed importance of attendance to all provider appointments,  Advised to contact provider for new or worsening symptoms Written information provided by mail as requested  Patient Self-Care Activities:  Attend all scheduled provider appointments Call pharmacy for medication refills 3-7 days in advance of running out of medications Call provider office for new concerns or questions  Take medications as prescribed   Work with the social worker to address care coordination needs and will continue to work with the clinical team to address health  care and disease management related needs  Plan:  IN OFFICE follow up appointment with care management team member scheduled for:   12/08/2024 at 11:00 am             Please call the Suicide and Crisis Lifeline: 988 call the [place]  National Suicide Prevention Lifeline: 936-442-0994 or TTY: 431-325-5021 TTY 416-100-9245) to talk to a trained counselor call 1-800-273-TALK (toll free, 24 hour hotline) go to [facility] [address] [place] [phone]) call 911 if you are experiencing a Mental Health or Behavioral Health Crisis or need someone to talk to.  Husband/Caregiver verbalized understanding of Care plan and visit instructions communicated this visit  Nestora Duos, MSN, RN [facility]  [facility], [facility] RN Care Manager Direct Dial: [phone] Fax: [phone]   DASH Eating Plan DASH stands for Dietary Approaches to Stop Hypertension. The DASH eating plan is a healthy eating plan that has been shown to: Lower high blood pressure (hypertension). Reduce your risk for type 2 diabetes, heart disease, and stroke. Help with weight loss. What are tips for following this plan? Reading food labels Check food labels for the amount of salt (sodium) per serving. Choose foods with less than 5 percent of the Daily Value (DV) of sodium. In general, foods with less than 300 milligrams (mg) of sodium per serving fit into this eating plan. To find whole grains, look for the word whole as the first word in the ingredient list. Shopping Buy products labeled as low-sodium or no salt added. Buy fresh foods. Avoid canned foods and pre-made or frozen meals. Cooking Try not to add salt when you cook. Use salt-free seasonings or herbs instead of table salt or sea salt. Check with your health care provider or pharmacist before using salt substitutes. Do not fry foods. Cook foods in healthy ways, such as baking, boiling, grilling, roasting, or broiling. Cook using oils that are good for your heart. These include olive, canola, avocado, soybean,  and sunflower oil. Meal planning  Eat a balanced diet. This should include: 4 or more servings of fruits and 4 or more servings of vegetables each day. Try to fill half of your plate with fruits and vegetables. 6-8 servings of whole grains each day. 6 or less servings of lean meat, poultry, or fish each day. 1 oz is 1 serving. A 3 oz (85 g) serving of meat is about the same size as the palm of your hand. One egg is 1 oz (28 g). 2-3 servings of low-fat dairy each day. One serving is 1 cup (237 mL). 1 serving of nuts, seeds, or beans 5 times each week. 2-3 servings of heart-healthy fats. Healthy fats called omega-3 fatty acids are found in foods such as walnuts, flaxseeds, fortified milks, and eggs. These fats are also found in cold-water fish, such as sardines, salmon, and mackerel. Limit how much you eat of: Canned or prepackaged foods. Food that is high in trans fat, such as fried foods. Food that is high in saturated fat, such as fatty meat. Desserts and other sweets, sugary drinks, and other foods with added sugar. Full-fat dairy products. Do not salt foods before eating. Do not eat more than 4 egg yolks a week. Try to eat at least 2 vegetarian meals a week. Eat more home-cooked food and less restaurant, buffet, and fast food. Lifestyle When eating at a restaurant, ask if your food can be made with less salt or no salt. If you drink  alcohol : Limit how much you have to: 0-1 drink a day if you are female. 0-2 drinks a day if you are female. Know how much alcohol  is in your drink. In the [place] one drink is one 12 oz bottle of beer (355 mL), one 5 oz glass of wine (148 mL), or one 1 oz glass of hard liquor (44 mL). General information Avoid eating more than 2,300 mg of salt a day. If you have hypertension, you may need to reduce your sodium intake to 1,500 mg a day. Work with your provider to stay at a healthy body weight or lose weight. Ask what the best weight range is for you. On most  days of the week, get at least 30 minutes of exercise that causes your heart to beat faster. This may include walking, swimming, or biking. Work with your provider or dietitian to adjust your eating plan to meet your specific calorie needs. What foods should I eat? Fruits All fresh, dried, or frozen fruit. Canned fruits that are in their natural juice and do not have sugar added to them. Vegetables Fresh or frozen vegetables that are raw, steamed, roasted, or grilled. Low-sodium or reduced-sodium tomato and vegetable juice. Low-sodium or reduced-sodium tomato sauce and tomato paste. Low-sodium or reduced-sodium canned vegetables. Grains Whole-grain or whole-wheat bread. Whole-grain or whole-wheat pasta. Brown rice. [place]neil Madeira. Bulgur. Whole-grain and low-sodium cereals. Pita bread. Low-fat, low-sodium crackers. Whole-wheat flour tortillas. Meats and other proteins Skinless chicken or turkey. Ground chicken or turkey. Pork with fat trimmed off. Fish and seafood. Egg whites. Dried beans, peas, or lentils. Unsalted nuts, nut butters, and seeds. Unsalted canned beans. Lean cuts of beef with fat trimmed off. Low-sodium, lean precooked or cured meat, such as sausages or meat loaves. Dairy Low-fat (1%) or fat-free (skim) milk. Reduced-fat, low-fat, or fat-free cheeses. Nonfat, low-sodium ricotta or cottage cheese. Low-fat or nonfat yogurt. Low-fat, low-sodium cheese. Fats and oils Soft margarine without trans fats. Vegetable oil. Reduced-fat, low-fat, or light mayonnaise and salad dressings (reduced-sodium). Canola, safflower, olive, avocado, soybean, and sunflower oils. Avocado. Seasonings and condiments Herbs. Spices. Seasoning mixes without salt. Other foods Unsalted popcorn and pretzels. Fat-free sweets. The items listed above may not be all the foods and drinks you can have. Talk to a dietitian to learn more. What foods should I avoid? Fruits Canned fruit in a light or heavy syrup. Fried  fruit. Fruit in cream or butter sauce. Vegetables Creamed or fried vegetables. Vegetables in a cheese sauce. Regular canned vegetables that are not marked as low-sodium or reduced-sodium. Regular canned tomato sauce and paste that are not marked as low-sodium or reduced-sodium. Regular tomato and vegetable juices that are not marked as low-sodium or reduced-sodium. Dene. Olives. Grains Baked goods made with fat, such as croissants, muffins, or some breads. Dry pasta or rice meal packs. Meats and other proteins Fatty cuts of meat. Ribs. Fried meat. [place]. Bologna, salami, and other precooked or cured meats, such as sausages or meat loaves, that are not lean and low in sodium. Fat from the back of a pig (fatback). Bratwurst. Salted nuts and seeds. Canned beans with added salt. Canned or smoked fish. Whole eggs or egg yolks. Chicken or turkey with skin. Dairy Whole or 2% milk, cream, and half-and-half. Whole or full-fat cream cheese. Whole-fat or sweetened yogurt. Full-fat cheese. Nondairy creamers. Whipped toppings. Processed cheese and cheese spreads. Fats and oils Butter. Stick margarine. Lard. Shortening. Ghee. Bacon fat. Tropical oils, such as coconut, palm kernel, or palm  oil. Seasonings and condiments Onion salt, garlic salt, seasoned salt, table salt, and sea salt. Worcestershire sauce. Tartar sauce. Barbecue sauce. Teriyaki sauce. Soy sauce, including reduced-sodium soy sauce. Steak sauce. Canned and packaged gravies. Fish sauce. Oyster sauce. Cocktail sauce. Store-bought horseradish. Ketchup. Mustard. Meat flavorings and tenderizers. Bouillon cubes. Hot sauces. Pre-made or packaged marinades. Pre-made or packaged taco seasonings. Relishes. Regular salad dressings. Other foods Salted popcorn and pretzels. The items listed above may not be all the foods and drinks you should avoid. Talk to a dietitian to learn more. Where to find more information National Heart, Lung, and Blood Institute  (NHLBI): buffalodrycleaner.gl American Heart Association (AHA): heart.org Academy of Nutrition and Dietetics: eatright.org National Kidney Foundation (NKF): kidney.org This information is not intended to replace advice given to you by your health care provider. Make sure you discuss any questions you have with your health care provider. Document Revised: 12/23/2022 Document Reviewed: 12/23/2022 Elsevier Patient Education  2024 Elsevier Inc.Dementia Dementia is a condition that affects the way the brain works. It often affects thinking and memory.  There are many types of dementia, including: Alzheimer's disease. This is the most common type. Vascular dementia. This type may happen due to a stroke. Lewy body dementia. This type may happen to people who have Parkinson's disease. Frontotemporal dementia. This type is caused by damage to nerve cells in certain parts of the brain. Some people may have more than one type. What are the causes? Dementia is caused by damage to cells in the brain. Some causes that can't be reversed include: Having a condition that affects the blood vessels of the brain. This may be diabetes or heart disease. Changes to genes. Some causes that can be reversed or slowed down include: Injury to the brain due to: A growth called a tumor. A blood clot. Too much fluid in the brain. Taking certain medicines. An infection. Problems with your thyroid . Not having enough vitamin B12 in the body. Having a disease that causes your body's defense system, called the immune system, to attack healthy parts of your body. What are the signs or symptoms? Symptoms of dementia start slowly and get worse with time. They may include: Problems remembering events or people. Getting lost easily. Forgetting appointments or to pay bills. Having trouble taking a bath or putting clothes on. Having trouble planning and making meals. Having trouble speaking. Changes in behavior or mood. How is  this diagnosed? Dementia may be diagnosed based on: Your symptoms and medical history. A physical exam. Tests. These may include: Tests to check your thinking and memory to see how your brain is working. Lab tests. You may have tests on your blood or pee (urine). Imaging tests, such as a CT scan, a PET scan, or an MRI. Genetic testing. This may be done if other family members have had dementia. Your health care provider will talk with you and your family, friends, or caregivers about your history and symptoms. How is this treated? Treatment depends on the cause of the dementia and should start as soon as possible. It might include: Taking medicines for symptoms. Taking medicines to help control or slow down the dementia. Treating the cause of your dementia. Your provider can help you find support groups and other members of the health care team who can help with your care. Follow these instructions at home: Medicines Take medicines only as told by your provider. Use a pill organizer or pill reminder to help you keep track of your medicines. Avoid  taking medicines for pain or for sleep. These can affect your thinking. Lifestyle Make healthy choices. Be active as told by your provider. Do not smoke, vape, or use products with nicotine or tobacco in them. If you need help quitting, talk with your provider. Do not drink alcohol . When you feel a lot of stress, do something that helps you relax. Your provider can give you tips. Spend time with other people. Make sure you get good sleep at night. These tips can help: Try not to take naps during the day. Keep your bedroom dark and cool. Do not exercise in the few hours before you go to bed. Do not have foods or drinks with caffeine at night. Eating and drinking Drink enough fluid to keep your pee pale yellow. Eat a healthy diet. General instructions  Talk with your provider to decide on: What things you need help with. What your  safety needs are. Ask your provider if it's safe for you to drive. If told, wear a bracelet that tracks where you are or shows that you're a person with memory loss. Work with your family to make big legal or health decisions. This may include things like advance directives, medical power of attorney, or a living will. Where to find more information Alzheimer's Association: westerntunes.it [employer] on Aging: baseringtones.pl World Health Organization: visitdestination.com.br Contact a health care provider if: You have any new symptoms. Your symptoms get worse. You have problems with swallowing. Get help right away if: You feel very sad or feel like you may hurt yourself or others. You have thoughts about taking your own life. Your family members are worried about your safety. These symptoms may be an emergency. Take one of these steps right away: Go to your nearest emergency room. Call 911. Call the National Suicide Prevention Lifeline at 815-638-9791 or 988. Text the Crisis Text Line at (608)161-0526. This information is not intended to replace advice given to you by your health care provider. Make sure you discuss any questions you have with your health care provider. Document Revised: 10/06/2023 Document Reviewed: 02/21/2023 Elsevier Patient Education  2024 Elsevier Inc.  Dementia Caregiver Guide Dementia is a condition that affects the way the brain works. It often affects thinking and memory. A person with dementia may: Forget things. Have trouble talking or responding to your questions. Have trouble paying attention. Have trouble thinking clearly and making good decisions. Get lost or wander away from home or other places. Have big changes in their mood or emotions. They may: Feel very worried, nervous, or depressed. Have angry outbursts. Be suspicious or accuse you of things. Have childlike behavior and language. Taking care of someone with dementia can be a challenge. The tips below can help  you care for the person. How to help manage lifestyle changes Dementia usually gets worse slowly over time. In the early stages, people with dementia can stay safe and take care of themselves with some help. In later stages, they need help with daily tasks like getting dressed, grooming, and going to the bathroom. Communicating When the person is talking and seems frustrated, make eye contact and hold the person's hand. Ask questions that can be answered with a yes or no. Use simple words and a calm voice. Only give one direction at a time. Limit choices for the person. Too many choices can be stressful. Avoid correcting the person in a negative way. If the person can't find the right words, gently try to help. Preventing injury  Keep floors clear. Remove rugs, magazine racks, and floor lamps. Keep hallways well lit, especially at night. Put a handrail and nonslip mat in the bathtub or shower. Put childproof locks on cabinets that have dangerous items in them. These items include medicine, alcohol , guns, cleaning products, and sharp tools. For doors to the outside, put locks where the person can't see or reach them. This helps keep the person from going out of the house and getting lost. Be ready for emergencies. Keep a list of emergency phone numbers and addresses close by. Remove car keys and lock garage doors so the person doesn't try to drive. Have the person wear a bracelet that tracks where they are and shows that they're a person with memory loss. This should be worn at all times for safety. Helping with daily life  Keep the person on track with their daily routine. Try to identify areas where the person may need help. Be supportive, patient, calm, and encouraging. Gently remind the person that adjusting to changes takes time. Help with the tasks that the person has asked for help with. Keep the person involved in daily tasks and decisions as much as you can. Encourage  conversation, but try not to get frustrated if the person struggles to find words or doesn't seem to appreciate your help. Other tips Think about any safety risks and take steps to avoid them. Keep things organized: Organize medicines in a pill box for each day of the week. Keep a calendar in a central place. Use it to remind the person of health care visits or other activities. Create a plan to handle any legal or financial matters. Get help from a professional if needed. Help make sure the person: Takes medicines only as told by their health care providers. Eats regular, healthy meals. They should also drink plenty of fluids. Goes to all scheduled health care appointments. Gets regular sleep. Taking care of yourself Being a caregiver for someone with dementia can be hard. You may feel stressed and have many other emotions. It's important to also take care of yourself. Here are some tips: Find out about services that can provide short-term care for the person. This is called respite care. It can allow you to take a break when you need one. Find healthy ways to deal with stress. Some ways include: Spending time with other people. Exercising. Meditating or doing deep breathing exercises. Take care of your own health by: Getting enough sleep. Eating healthy foods. Getting regular exercise. Join a support group with others who are caregivers. These groups can help you: Learn other ways to deal with stress. Share experiences with others. Get emotional comfort and support. Learn about caregiving as the disease gets worse. Find resources in your community. Where to find support: Many people and organizations offer support. These include: Support groups for people with dementia. Support groups for caregivers. Counselors or therapists. Home health care services. Adult day care centers. Where to find more information Alzheimer's Association: westerntunes.it Family Caregiver Alliance:  caregiver.org Alzheimer's Foundation of America: alzfdn.org Contact a health care provider if: The person's health is quickly getting worse. You're no longer able to care for the person. Caring for the person is affecting your physical and emotional health. You're feeling worried, nervous, or depressed about caring for the person. Get help right away if: You feel like the person may hurt themselves or others. The person has talked about taking their own life. These symptoms may be an emergency. Take one of  these steps right away: Go to your nearest emergency room. Call 911. Call the National Suicide Prevention Lifeline at 7344765054 or 988. Text the Crisis Text Line at (606)142-9323. This information is not intended to replace advice given to you by your health care provider. Make sure you discuss any questions you have with your health care provider. Document Revised: 03/18/2023 Document Reviewed: 03/18/2023 Elsevier Patient Education  2024 [employer].   Fall Prevention in the Home, Adult Falls can cause injuries and can happen to people of all ages. There are many things you can do to make your home safer and to help prevent falls. What actions can I take to prevent falls? General information Use good lighting in all rooms. Make sure to: Replace any light bulbs that burn out. Turn on the lights in dark areas and use night-lights. Keep items that you use often in easy-to-reach places. Lower the shelves around your home if needed. Move furniture so that there are clear paths around it. Do not use throw rugs or other things on the floor that can make you trip. If any of your floors are uneven, fix them. Add color or contrast paint or tape to clearly mark and help you see: Grab bars or handrails. First and last steps of staircases. Where the edge of each step is. If you use a ladder or stepladder: Make sure that it is fully opened. Do not climb a closed ladder. Make sure the sides  of the ladder are locked in place. Have someone hold the ladder while you use it. Know where your pets are as you move through your home. What can I do in the bathroom?     Keep the floor dry. Clean up any water on the floor right away. Remove soap buildup in the bathtub or shower. Buildup makes bathtubs and showers slippery. Use non-skid mats or decals on the floor of the bathtub or shower. Attach bath mats securely with double-sided, non-slip rug tape. If you need to sit down in the shower, use a non-slip stool. Install grab bars by the toilet and in the bathtub and shower. Do not use towel bars as grab bars. What can I do in the bedroom? Make sure that you have a light by your bed that is easy to reach. Do not use any sheets or blankets on your bed that hang to the floor. Have a firm chair or bench with side arms that you can use for support when you get dressed. What can I do in the kitchen? Clean up any spills right away. If you need to reach something above you, use a step stool with a grab bar. Keep electrical cords out of the way. Do not use floor polish or wax that makes floors slippery. What can I do with my stairs? Do not leave anything on the stairs. Make sure that you have a light switch at the top and the bottom of the stairs. Make sure that there are handrails on both sides of the stairs. Fix handrails that are broken or loose. Install non-slip stair treads on all your stairs if they do not have carpet. Avoid having throw rugs at the top or bottom of the stairs. Choose a carpet that does not hide the edge of the steps on the stairs. Make sure that the carpet is firmly attached to the stairs. Fix carpet that is loose or worn. What can I do on the outside of my home? Use bright outdoor lighting. Fix the  edges of walkways and driveways and fix any cracks. Clear paths of anything that can make you trip, such as tools or rocks. Add color or contrast paint or tape to clearly  mark and help you see anything that might make you trip as you walk through a door, such as a raised step or threshold. Trim any bushes or trees on paths to your home. Check to see if handrails are loose or broken and that both sides of all steps have handrails. Install guardrails along the edges of any raised decks and porches. Have leaves, snow, or ice cleared regularly. Use sand, salt, or ice melter on paths if you live where there is ice and snow during the winter. Clean up any spills in your garage right away. This includes grease or oil spills. What other actions can I take? Review your medicines with your doctor. Some medicines can cause dizziness or changes in blood pressure, which increase your risk of falling. Wear shoes that: Have a low heel. Do not wear high heels. Have rubber bottoms and are closed at the toe. Feel good on your feet and fit well. Use tools that help you move around if needed. These include: Canes. Walkers. Scooters. Crutches. Ask your doctor what else you can do to help prevent falls. This may include seeing a physical therapist to learn to do exercises to move better and get stronger. Where to find more information Centers for Disease Control and Prevention, STEADI: tonerpromos.no [employer] on Aging: baseringtones.pl National Institute on Aging: baseringtones.pl Contact a doctor if: You are afraid of falling at home. You feel weak, drowsy, or dizzy at home. You fall at home. Get help right away if you: Lose consciousness or have trouble moving after a fall. Have a fall that causes a head injury. These symptoms may be an emergency. Get help right away. Call 911. Do not wait to see if the symptoms will go away. Do not drive yourself to the hospital. This information is not intended to replace advice given to you by your health care provider. Make sure you discuss any questions you have with your health care provider. Document Revised: 08/09/2022 Document Reviewed:  08/09/2022 Elsevier Patient Education  2024 [employer].

## 2024-12-05 NOTE — Patient Outreach (Signed)
 RNCM contacted [employer] regarding referral at husband's request and consulted with LCSW Chrystal Land. Determined not qualified for program as both patient and husband must be homebound and unable to cook. Husband drives and is able to prepare microwave meals. Will explore additional options and notify husband.

## 2024-12-18 ENCOUNTER — Other Ambulatory Visit (INDEPENDENT_AMBULATORY_CARE_PROVIDER_SITE_OTHER): Payer: Self-pay

## 2024-12-18 ENCOUNTER — Telehealth: Payer: Self-pay

## 2024-12-18 DIAGNOSIS — F03B4 Unspecified dementia, moderate, with anxiety: Secondary | ICD-10-CM

## 2024-12-18 DIAGNOSIS — F03918 Unspecified dementia, unspecified severity, with other behavioral disturbance: Secondary | ICD-10-CM

## 2024-12-18 DIAGNOSIS — Z9181 History of falling: Secondary | ICD-10-CM

## 2024-12-18 NOTE — Telephone Encounter (Signed)
 Copied from CRM #[id number]. Topic: General - Other >> Dec 18, 2024  1:04 PM Thersia C wrote: Reason for CRM: teresa cooper  - patient husband called in stated they  need pallative care for patient  - needs face sheet and last notes for today and order  [phone] fax:[phone]

## 2024-12-18 NOTE — Addendum Note (Signed)
 Addended by: MARYLYNN VERNEITA CROME on: 12/18/2024 03:39 PM   Modules accepted: Orders

## 2024-12-18 NOTE — Patient Instructions (Signed)
 Visit Information  Thank you for taking time to visit with me today. Please don't hesitate to contact me if I can be of assistance to you before our next scheduled appointment.  Your next care management appointment is in-person at [facility] OFFICE  on 01/08/2025 at 11:00 am  Face to Face appointment date/time: 01/08/2025 at 11:00 am  Please call the care guide team at [phone] if you need to cancel, schedule, or reschedule an appointment.   Please call the Suicide and Crisis Lifeline: 988 call the [place]  National Suicide Prevention Lifeline: 843-542-9089 or TTY: (440)606-3984 TTY 5716617809) to talk to a trained counselor call 1-800-273-TALK (toll free, 24 hour hotline) go to [facility] [address] [place] [phone]) call 911 if you are experiencing a Mental Health or Behavioral Health Crisis or need someone to talk to.  Nestora Duos, MSN, RN [facility], [facility] RN Care Manager Direct Dial: [phone] Fax: [phone]

## 2024-12-19 NOTE — Telephone Encounter (Signed)
 Spoke with [name] [name] and stated that pt already has palliative care with another visit set up for next week. So she isn't sure what else can be offered other than some home health services.

## 2024-12-24 ENCOUNTER — Ambulatory Visit: Attending: Cardiology | Admitting: Cardiology

## 2024-12-24 ENCOUNTER — Telehealth: Payer: Self-pay

## 2024-12-24 ENCOUNTER — Encounter: Payer: Self-pay | Admitting: Cardiology

## 2024-12-24 VITALS — BP 120/48 | HR 65 | Ht 63.0 in | Wt 129.6 lb

## 2024-12-24 DIAGNOSIS — I10 Essential (primary) hypertension: Secondary | ICD-10-CM

## 2024-12-24 DIAGNOSIS — Z950 Presence of cardiac pacemaker: Secondary | ICD-10-CM | POA: Diagnosis not present

## 2024-12-24 DIAGNOSIS — I5032 Chronic diastolic (congestive) heart failure: Secondary | ICD-10-CM

## 2024-12-24 DIAGNOSIS — I4821 Permanent atrial fibrillation: Secondary | ICD-10-CM

## 2024-12-24 DIAGNOSIS — I5020 Unspecified systolic (congestive) heart failure: Secondary | ICD-10-CM

## 2024-12-24 NOTE — Patient Instructions (Signed)
 Medication Instructions:  Your physician recommends that you continue on your current medications as directed. Please refer to the Current Medication list given to you today.  *If you need a refill on your cardiac medications before your next appointment, please call your pharmacy*  Lab Work: No labs ordered today    Testing/Procedures: No test ordered today   Follow-Up: At [facility], you and your health needs are our priority.  As part of our continuing mission to provide you with exceptional heart care, our providers are all part of one team.  This team includes your primary Cardiologist (physician) and Advanced Practice Providers or APPs (Physician Assistants and Nurse Practitioners) who all work together to provide you with the care you need, when you need it.  Your next appointment:   6 month(s)  Provider:      Dr. Fonda Kitty

## 2024-12-24 NOTE — Telephone Encounter (Signed)
-----   Message from Suzann Riddle, NP sent at 12/22/2024 11:18 AM EST ----- Regarding: Monday appt This patient is scheduled to see me Monday morning, just saw Arida in December and ws doing well.  It would be completley reasonable to follow-up with Dr. Drucilla or myself in 06/2025 if they're agreeable.   Heads up on this patient - patient has dementia, pleasantly demented. Husband makes all decisions and cares for wife, he is VERY HOH, like yelling at him. Phone calls can be tough. If there is any concern on his part, just keep the appointmnet.    Thanks, Suzann

## 2024-12-24 NOTE — Progress Notes (Signed)
 "     Electrophysiology Clinic Note    Date:  12/24/2024  Patient ID:  [name] [name], [name] [name] January 26, 1936, MRN [id number] PCP:  Marylynn Verneita CROME, MD  Cardiologist:  Deatrice Cage, MD  Electrophysiologist:  Fonda Kitty, MD      Discussed the use of AI scribe software for clinical note transcription with the patient, who gave verbal consent to proceed.   Patient Profile    Chief Complaint: AFib follow-up  History of Present Illness: [name] [name] is a 89 y.o. 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 Fernande) for routine electrophysiology followup.   History of recurrent falls 6 /2025 resulting in SDH. Another fall 09/2024 - PCP discussed with Dr. Cage, who recommended stopping eliquis .  She recently saw Dr. Cage 11/2024 at which time the patient's husband had resumed 2.5mg  eliquis  BID. The patient hit her head on towel bar (did not fall) later 11/2024 at which time eliquis  was stopped.   Today, the patient's husband says that she has not had any further falls or injuries since that time. He remains quite nervous about her stroke risk while being off eliquis . He says that her energy level seems less since she stopped eliquis .   She says taht she has very minimal appetite, husband agrees that she is eating very little.  Her lower extremity edema is stable. She denies chest pain, chest pressure, palpitations or dizziness.    Arrhythmia/Device History Abbott single chamber PPM, imp 03/2023; dx tachy-brady  AAD -  Amiodarone      ROS:  Please see the history of present illness. All other systems are reviewed and otherwise negative.    Physical Exam    VS:  BP (!) 120/48 (BP Location: Left Arm, Patient Position: Sitting, Cuff Size: Normal)   Pulse 65   Ht 5' 3 (1.6 m)    Wt 129 lb 9.6 oz (58.8 kg)   SpO2 96%   BMI 22.96 kg/m  BMI: Body mass index is 22.96 kg/m.           Wt Readings from Last 3 Encounters:  12/24/24 129 lb 9.6 oz (58.8 kg)  11/26/24 140 lb 6.9 oz (63.7 kg)  11/23/24 128 lb (58.1 kg)      GEN- The patient is well appearing, alert.  Lungs- Clear to ausculation bilaterally, normal work of breathing.  Heart- Irregularly irregular rate and rhythm, no murmurs, rubs or gallops Extremities- 1-2+ peripheral edema, warm, dry Skin-  device pocket well-healed, no tethering  Device not checked     Studies Reviewed   Previous EP, cardiology notes.    EKG is not ordered. Personal review of EKG from 11/26/2024 shows:  Junctional rhythm at 60bpm, LAD, IVCD        TTE, 06/06/2023  1. Left ventricular ejection fraction, by estimation, is 55 to 60%. The left ventricle has normal function.   2. Right ventricular systolic function is normal. The right ventricular size is moderately enlarged. There is mildly elevated pulmonary artery systolic pressure.   3. Left atrial size was severely dilated.   4. Right atrial size was severely dilated.   5. The mitral valve is normal in structure. Mild mitral valve regurgitation.   6. Tricuspid valve regurgitation is moderate to severe.   7. The inferior vena cava is dilated in size with <50% respiratory variability,  suggesting right atrial pressure of 15 mmHg.    TTE, 04/08/2023  1. Left ventricular ejection fraction, by estimation, is 55 to 60%. The left ventricle has normal function. The left ventricle has no regional wall motion abnormalities. Left ventricular diastolic parameters are indeterminate.   2. Right ventricular systolic function is normal. The right ventricular size is mildly enlarged. There is moderately elevated pulmonary artery systolic pressure. The estimated right ventricular systolic pressure is 51.8 mmHg.   3. Left atrial size was severely dilated.   4. Right atrial size was severely  dilated.   5. The mitral valve is normal in structure. Mild to moderate mitral valve regurgitation. No evidence of mitral stenosis.   6. Tricuspid valve regurgitation is mild to moderate.   7. The aortic valve is normal in structure. Aortic valve regurgitation is not visualized. No aortic stenosis is present.   8. The inferior vena cava is normal in size with greater than 50% respiratory variability, suggesting right atrial pressure of 3 mmHg.      Assessment and Plan     #) perm AFib #) tachy-brady #) amiodarone  monitoring Remains in rate-controlled AFib  Recent LFTs stable Continue 200mg  amiodarone  daily  #) Hypercoag d/t perm afib CHA2DS2-VASc Score = at least 6 [CHF History: 1, HTN History: 1, Diabetes History: 0, Stroke History: 0, Vascular Disease History: 1, Age Score: 2, Gender Score: 1].  Therefore, the patient's annual risk of stroke is 9.7 %.     Patient's eliquis  recently stopped by Dr. Darron for ongoing concerns related to her falls. Patient's husband is adamant that most recent injury 11/2024 was not related to a fall He would favor restarting eliquis , but requested I discuss further with Dr. Darron and Dr. Marylynn and call him with update       Current medicines are reviewed at length with the patient today.   The patient has concerns regarding her medicines.  The following changes were made today:  none  Labs/ tests ordered today include:  No orders of the defined types were placed in this encounter.    Disposition: Follow up with Dr. Kennyth or EP APP in 6 months   Signed, Monita Swier, NP  12/24/2024  4:03 PM  Electrophysiology [facility] "

## 2024-12-24 NOTE — Telephone Encounter (Signed)
 I left a voicemail for Ms. [name] [name], who is scheduled to see Suzann Riddle, NP in Electrophysiology today 12/24/24. Patient saw Dr. Darron in December, reporting that she is doing well. As per Suzann, it would be completely reasonable to push the appointment out until July 2026 if [name] [name] is doing well. Patient asked to let us  know if they would like to keep or reschedule this appointment.

## 2024-12-31 ENCOUNTER — Telehealth: Payer: Self-pay | Admitting: *Deleted

## 2024-12-31 ENCOUNTER — Telehealth: Payer: Self-pay

## 2024-12-31 NOTE — Telephone Encounter (Signed)
 Left message to call back.

## 2024-12-31 NOTE — Telephone Encounter (Signed)
 I left a voicemail for Mr. and Mrs. Gretta, stating as per Suzann Riddle, NP in Electrophysiology, Dr. Darron of General Cardiology and Primary Care Physician Dr. Marylynn are all in agreement that Mrs. Straka should NOT restart eliquis  due to recent/ frequent falls. Suzann Riddle, NP notified.

## 2024-12-31 NOTE — Telephone Encounter (Signed)
-----   Message from Chantal Needle, NP sent at 12/29/2024 12:18 PM EST ----- Could you please call this patient's husband to update that Dr. Darron, PCP, and EP are all in agreement that patient should not restart eliquis  d/t frequent falls? ----- Message ----- From: Darron Deatrice LABOR, MD Sent: 12/28/2024  12:41 PM EST To: Verneita LITTIE Kettering, MD; Chantal Needle, NP; Joshu#  I discussed this extensively with Dr. Kettering and we both agree that risks of anticoagulation outweigh the benefits at this time given recurrent falls.  She had 6 falls in total since June with significant injuries.  Her husband tends to underestimate the severity of her falls and is driving the decision to continue anticoagulation.  She is not a candidate for a Watchman device.  She has advanced dementia. ----- Message ----- From: Riddle, Suzann, NP Sent: 12/24/2024   4:07 PM EST To: Verneita LITTIE Kettering, MD; Deatrice LABOR Darron, MD; Jo#  Hi team - saw Mrs. Corbit earlier today. Her husband requests to restart eliquis . Patient's last falls are in July, October, and then none since. December injury was NOT a fall, she bumped her head on towel bar.  He says she had more energy while on eliquis . He wants us  to all discuss and come to agreement.   Chadsvasc score = 6 Prior Dr. Fernande patient w perm Afib, single lead PPM for tachy-brady. Dementia, falls.

## 2025-01-01 ENCOUNTER — Emergency Department
Admission: EM | Admit: 2025-01-01 | Discharge: 2025-01-01 | Disposition: A | Discharge status: Home / Self Care | Attending: Emergency Medicine | Admitting: Emergency Medicine

## 2025-01-01 ENCOUNTER — Emergency Department

## 2025-01-01 ENCOUNTER — Other Ambulatory Visit: Payer: Self-pay

## 2025-01-01 DIAGNOSIS — I10 Essential (primary) hypertension: Secondary | ICD-10-CM | POA: Insufficient documentation

## 2025-01-01 DIAGNOSIS — E119 Type 2 diabetes mellitus without complications: Secondary | ICD-10-CM | POA: Diagnosis not present

## 2025-01-01 DIAGNOSIS — S4992XA Unspecified injury of left shoulder and upper arm, initial encounter: Secondary | ICD-10-CM | POA: Diagnosis present

## 2025-01-01 DIAGNOSIS — W01198A Fall on same level from slipping, tripping and stumbling with subsequent striking against other object, initial encounter: Secondary | ICD-10-CM | POA: Diagnosis not present

## 2025-01-01 DIAGNOSIS — F0390 Unspecified dementia without behavioral disturbance: Secondary | ICD-10-CM | POA: Insufficient documentation

## 2025-01-01 DIAGNOSIS — S42212A Unspecified displaced fracture of surgical neck of left humerus, initial encounter for closed fracture: Secondary | ICD-10-CM | POA: Diagnosis not present

## 2025-01-01 NOTE — ED Triage Notes (Signed)
 Pt comes via EMS from home with trip and fall this morning. Pt no longer on thinners. Pt has left arm bruising and takes pain.

## 2025-01-01 NOTE — Discharge Instructions (Signed)
 You have a crack in the bone of your humerus, please use your sling and follow-up with orthopedics.  Please return for any new, worsening, or changing symptoms or other concerns.  It was a pleasure caring for you today.

## 2025-01-01 NOTE — ED Provider Notes (Signed)
 "  [facility] Provider Note    Event Date/Time   First MD Initiated Contact with Patient 01/01/25 1119     (approximate)   History   Fall   HPI  [name] [name] is a 89 y.o. female who presents today for evaluation of left arm injury.  Patient presents with her husband.  She reports that she had a trip and fall and hit her left arm against a wall.  There is no head strike or LOC.  This happened a couple of days ago.  She has had bruising to her upper arm and pain with movement, prompting her to come to the emergency department for evaluation.  No headaches.  No nausea or vomiting.  She still able to ambulate.  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   Mass of breast 08/03/2023   Neck pain 06/13/2023   Dementia (HCC) 06/07/2023   Congestive heart failure with cardiomyopathy (HCC) 06/07/2023   Cardiac pacemaker in situ 04/23/2023   Tachycardia-bradycardia (HCC) 04/23/2023   GERD without esophagitis 04/08/2023   Sinus pause 04/08/2023   Syncope 04/07/2023   Coronary atherosclerosis due to calcified coronary lesion of native artery 03/05/2023   Statin myopathy 05/27/2022   Thrombophilia 09/10/2021   Insomnia 06/03/2021   Aortic atherosclerosis 02/04/2021   Porokeratosis 02/21/2020   Plantar flexed metatarsal bone of left foot 02/21/2020   Diet-controlled diabetes mellitus (HCC) 12/31/2019   Chronic venous insufficiency 09/01/2016   Preoperative clearance 06/29/2016   Visit for preventive health examination 12/15/2015   Cystocele 11/18/2015   Inguinal hernia 10/13/2015   Pulmonary hypertension, moderate to severe (HCC) 04/24/2015   Arthritis of knee, degenerative 02/21/2015   S/P  TAH-BSO 12/13/2014   S/P bilateral mastectomy 12/13/2014   Long term current use of anticoagulant therapy 09/25/2014   HH (hiatus hernia) 04/20/2014   Tachycardia induced cardiomyopathy (HCC) 07/20/2013   Vitamin D  deficiency 04/24/2013   Medicare annual wellness visit, subsequent 04/23/2013   Obesity 06/30/2012   History of [place] spotted fever 06/22/2012   Persistent atrial fibrillation (HCC) 05/09/2012   Anxiety and depression 05/09/2012   Hyperlipidemia 07/08/2010   Essential hypertension 03/05/2009          Physical Exam   Triage Vital Signs: ED Triage Vitals  Encounter Vitals Group     BP 01/01/25 1105 (!) 119/56     Girls Systolic BP Percentile --      Girls Diastolic BP Percentile --      Boys Systolic BP Percentile --      Boys Diastolic BP Percentile --      Pulse Rate 01/01/25 1105 66     Resp 01/01/25 1105 18     Temp 01/01/25 1105 98.2 F (36.8 C)     Temp src --      SpO2 01/01/25 1105 95 %     Weight 01/01/25 1105 129 lb 9.6 oz (58.8 kg)     Height 01/01/25 1105 5' 3 (1.6 m)     Head Circumference --      Peak Flow --      Pain Score 01/01/25 1104 4     Pain Loc --      Pain Education --  Exclude from Growth Chart --     Most recent vital signs: Vitals:   01/01/25 1105  BP: (!) 119/56  Pulse: 66  Resp: 18  Temp: 98.2 F (36.8 C)  SpO2: 95%    Physical Exam Vitals and nursing note reviewed.  Constitutional:      General: Awake and alert. No acute distress.    Appearance: Normal appearance. The patient is normal weight.  HENT:     Head: Normocephalic and atraumatic.     Mouth: Mucous membranes are moist.  Eyes:     General: PERRL. Normal EOMs        Right eye: No discharge.        Left eye: No discharge.     Conjunctiva/sclera: Conjunctivae normal.  Cardiovascular:     Rate and Rhythm: Normal rate.     Pulses: Normal pulses.  Pulmonary:     Effort: Pulmonary effort is normal. No respiratory distress.  Abdominal:      Abdomen is soft. There is no abdominal tenderness. No rebound or guarding. No distention. Musculoskeletal:        General: No swelling. Normal range of motion.     Cervical back: Normal range of motion and neck supple. Left upper extremity: Ecchymosis noted to upper arm to elbow.  Compartments are soft compressible throughout.  Tenderness palpation to proximal humerus without deformity noted.  No tenderness to elbow, forearm, or wrist.  Normal radial pulse.  Normal grip strength.  No open wounds. Skin:    General: Skin is warm and dry.     Capillary Refill: Capillary refill takes less than 2 seconds.     Findings: No rash.  Neurological:     Mental Status: The patient is awake and alert.      ED Results / Procedures / Treatments   Labs (all labs ordered are listed, but only abnormal results are displayed) Labs Reviewed - No data to display   EKG     RADIOLOGY I independently reviewed and interpreted imaging and agree with radiologists findings.     PROCEDURES:  Critical Care performed:   Procedures   MEDICATIONS ORDERED IN ED: Medications - No data to display   IMPRESSION / MDM / ASSESSMENT AND PLAN / ED COURSE  I reviewed the triage vital signs and the nursing notes.   Differential diagnosis includes, but is not limited to, fracture, dislocation, contusion.    Patient is awake and alert, hemodynamically stable and afebrile.  She is neurovascularly intact.  No head or neck injuries, no headache or neck pain, no focal neurological deficits, and there was no head strike at the time of the incident, no need for CT head or neck per [place] criteria.  X-ray of her forearm was obtained in triage, though her ecchymosis is in her humerus.  Therefore x-ray humerus obtained, and is revealing for a minimally displaced fracture of the anatomic neck of the humerus.  This was discussed with the patient.  She was placed in a sling and instructed to follow-up with orthopedics.  The  appropriate follow-up information was provided.  Also discussed rest, ice in the meantime.  Patient and husband understand and agree with plan.  Patient was discharged in stable condition.   Patient's presentation is most consistent with acute complicated illness / injury requiring diagnostic workup.   FINAL CLINICAL IMPRESSION(S) / ED DIAGNOSES   Final diagnoses:  Closed displaced fracture of surgical neck of left humerus, unspecified fracture morphology, initial encounter  Rx / DC Orders   ED Discharge Orders     None        Note:  This document was prepared using Dragon voice recognition software and may include unintentional dictation errors.   Hanny Elsberry E, PA-C 01/01/25 1414    Bradler, Evan K, MD 01/01/25 1530  "

## 2025-01-01 NOTE — ED Triage Notes (Signed)
 BIB [facility] from home. Patient fell against the wall this morning with walker, Did not fall to floor, just against wall.  Left arm bruising noted per EMS.  Pt not on thinners.  Patient c/o left arm pain.  Vs wnl.  Currently taking antibiotic for infected tooth. Husband stated to EMS that last night patient was up and down stating that she needed to move bowels, but spouse was unsure if and or when pt last BM was.

## 2025-01-04 NOTE — Telephone Encounter (Signed)
 Spoke with pt's husband,DPR and advised pt should not restart Eliquis  per Dr Darron, PCP and EP provider.  Pt's husband states pt has not restarted medication and thanked CHARITY FUNDRAISER for the call.

## 2025-01-07 ENCOUNTER — Emergency Department

## 2025-01-07 ENCOUNTER — Inpatient Hospital Stay
Admission: EM | Admit: 2025-01-07 | Discharge: 2025-01-10 | DRG: 291 | Disposition: A | Discharge status: Hospice | Attending: Internal Medicine | Admitting: Internal Medicine

## 2025-01-07 ENCOUNTER — Encounter: Payer: Self-pay | Admitting: Intensive Care

## 2025-01-07 ENCOUNTER — Other Ambulatory Visit: Payer: Self-pay

## 2025-01-07 DIAGNOSIS — I509 Heart failure, unspecified: Secondary | ICD-10-CM | POA: Diagnosis not present

## 2025-01-07 DIAGNOSIS — Z950 Presence of cardiac pacemaker: Secondary | ICD-10-CM

## 2025-01-07 DIAGNOSIS — Z833 Family history of diabetes mellitus: Secondary | ICD-10-CM

## 2025-01-07 DIAGNOSIS — Z90710 Acquired absence of both cervix and uterus: Secondary | ICD-10-CM

## 2025-01-07 DIAGNOSIS — Z923 Personal history of irradiation: Secondary | ICD-10-CM

## 2025-01-07 DIAGNOSIS — S42202A Unspecified fracture of upper end of left humerus, initial encounter for closed fracture: Secondary | ICD-10-CM | POA: Diagnosis present

## 2025-01-07 DIAGNOSIS — Z6823 Body mass index (BMI) 23.0-23.9, adult: Secondary | ICD-10-CM

## 2025-01-07 DIAGNOSIS — I2510 Atherosclerotic heart disease of native coronary artery without angina pectoris: Secondary | ICD-10-CM | POA: Diagnosis present

## 2025-01-07 DIAGNOSIS — E785 Hyperlipidemia, unspecified: Secondary | ICD-10-CM | POA: Diagnosis present

## 2025-01-07 DIAGNOSIS — Z1611 Resistance to penicillins: Secondary | ICD-10-CM | POA: Diagnosis present

## 2025-01-07 DIAGNOSIS — E43 Unspecified severe protein-calorie malnutrition: Secondary | ICD-10-CM | POA: Diagnosis present

## 2025-01-07 DIAGNOSIS — C772 Secondary and unspecified malignant neoplasm of intra-abdominal lymph nodes: Secondary | ICD-10-CM | POA: Diagnosis present

## 2025-01-07 DIAGNOSIS — N1831 Chronic kidney disease, stage 3a: Secondary | ICD-10-CM | POA: Diagnosis present

## 2025-01-07 DIAGNOSIS — Z86008 Personal history of in-situ neoplasm of other site: Secondary | ICD-10-CM

## 2025-01-07 DIAGNOSIS — S42302A Unspecified fracture of shaft of humerus, left arm, initial encounter for closed fracture: Secondary | ICD-10-CM | POA: Diagnosis present

## 2025-01-07 DIAGNOSIS — Z8419 Family history of other disorders of kidney and ureter: Secondary | ICD-10-CM

## 2025-01-07 DIAGNOSIS — I441 Atrioventricular block, second degree: Secondary | ICD-10-CM | POA: Diagnosis present

## 2025-01-07 DIAGNOSIS — I4819 Other persistent atrial fibrillation: Secondary | ICD-10-CM | POA: Diagnosis present

## 2025-01-07 DIAGNOSIS — F411 Generalized anxiety disorder: Secondary | ICD-10-CM | POA: Diagnosis present

## 2025-01-07 DIAGNOSIS — C7951 Secondary malignant neoplasm of bone: Secondary | ICD-10-CM | POA: Diagnosis present

## 2025-01-07 DIAGNOSIS — Z91041 Radiographic dye allergy status: Secondary | ICD-10-CM

## 2025-01-07 DIAGNOSIS — E1122 Type 2 diabetes mellitus with diabetic chronic kidney disease: Secondary | ICD-10-CM | POA: Diagnosis present

## 2025-01-07 DIAGNOSIS — K219 Gastro-esophageal reflux disease without esophagitis: Secondary | ICD-10-CM | POA: Diagnosis present

## 2025-01-07 DIAGNOSIS — B9620 Unspecified Escherichia coli [E. coli] as the cause of diseases classified elsewhere: Secondary | ICD-10-CM | POA: Diagnosis present

## 2025-01-07 DIAGNOSIS — C7972 Secondary malignant neoplasm of left adrenal gland: Secondary | ICD-10-CM | POA: Diagnosis present

## 2025-01-07 DIAGNOSIS — Z803 Family history of malignant neoplasm of breast: Secondary | ICD-10-CM

## 2025-01-07 DIAGNOSIS — Z800 Family history of malignant neoplasm of digestive organs: Secondary | ICD-10-CM

## 2025-01-07 DIAGNOSIS — I4821 Permanent atrial fibrillation: Secondary | ICD-10-CM | POA: Diagnosis present

## 2025-01-07 DIAGNOSIS — C4A9 Merkel cell carcinoma, unspecified: Secondary | ICD-10-CM | POA: Diagnosis present

## 2025-01-07 DIAGNOSIS — F32A Depression, unspecified: Secondary | ICD-10-CM | POA: Diagnosis present

## 2025-01-07 DIAGNOSIS — I130 Hypertensive heart and chronic kidney disease with heart failure and stage 1 through stage 4 chronic kidney disease, or unspecified chronic kidney disease: Principal | ICD-10-CM | POA: Diagnosis present

## 2025-01-07 DIAGNOSIS — F0394 Unspecified dementia, unspecified severity, with anxiety: Secondary | ICD-10-CM | POA: Diagnosis present

## 2025-01-07 DIAGNOSIS — Z96652 Presence of left artificial knee joint: Secondary | ICD-10-CM | POA: Diagnosis present

## 2025-01-07 DIAGNOSIS — W19XXXA Unspecified fall, initial encounter: Secondary | ICD-10-CM | POA: Diagnosis present

## 2025-01-07 DIAGNOSIS — N390 Urinary tract infection, site not specified: Secondary | ICD-10-CM | POA: Diagnosis present

## 2025-01-07 DIAGNOSIS — G9341 Metabolic encephalopathy: Secondary | ICD-10-CM | POA: Diagnosis present

## 2025-01-07 DIAGNOSIS — D539 Nutritional anemia, unspecified: Secondary | ICD-10-CM | POA: Diagnosis present

## 2025-01-07 DIAGNOSIS — E119 Type 2 diabetes mellitus without complications: Secondary | ICD-10-CM

## 2025-01-07 DIAGNOSIS — Z66 Do not resuscitate: Secondary | ICD-10-CM | POA: Diagnosis present

## 2025-01-07 DIAGNOSIS — R4182 Altered mental status, unspecified: Secondary | ICD-10-CM

## 2025-01-07 DIAGNOSIS — I428 Other cardiomyopathies: Secondary | ICD-10-CM | POA: Diagnosis present

## 2025-01-07 DIAGNOSIS — Z515 Encounter for palliative care: Secondary | ICD-10-CM

## 2025-01-07 DIAGNOSIS — Z9013 Acquired absence of bilateral breasts and nipples: Secondary | ICD-10-CM

## 2025-01-07 DIAGNOSIS — F0390 Unspecified dementia without behavioral disturbance: Secondary | ICD-10-CM | POA: Diagnosis present

## 2025-01-07 DIAGNOSIS — N179 Acute kidney failure, unspecified: Secondary | ICD-10-CM | POA: Diagnosis present

## 2025-01-07 DIAGNOSIS — W1830XA Fall on same level, unspecified, initial encounter: Secondary | ICD-10-CM | POA: Diagnosis present

## 2025-01-07 DIAGNOSIS — I429 Cardiomyopathy, unspecified: Secondary | ICD-10-CM

## 2025-01-07 DIAGNOSIS — C787 Secondary malignant neoplasm of liver and intrahepatic bile duct: Secondary | ICD-10-CM | POA: Diagnosis present

## 2025-01-07 DIAGNOSIS — I5033 Acute on chronic diastolic (congestive) heart failure: Secondary | ICD-10-CM | POA: Diagnosis present

## 2025-01-07 DIAGNOSIS — F0393 Unspecified dementia, unspecified severity, with mood disturbance: Secondary | ICD-10-CM | POA: Diagnosis present

## 2025-01-07 DIAGNOSIS — I10 Essential (primary) hypertension: Secondary | ICD-10-CM | POA: Diagnosis present

## 2025-01-07 DIAGNOSIS — Z79899 Other long term (current) drug therapy: Secondary | ICD-10-CM

## 2025-01-07 DIAGNOSIS — Z85828 Personal history of other malignant neoplasm of skin: Secondary | ICD-10-CM

## 2025-01-07 DIAGNOSIS — M810 Age-related osteoporosis without current pathological fracture: Secondary | ICD-10-CM | POA: Diagnosis present

## 2025-01-07 DIAGNOSIS — Z8249 Family history of ischemic heart disease and other diseases of the circulatory system: Secondary | ICD-10-CM

## 2025-01-07 DIAGNOSIS — C4A4 Merkel cell carcinoma of scalp and neck: Secondary | ICD-10-CM | POA: Diagnosis present

## 2025-01-07 DIAGNOSIS — B961 Klebsiella pneumoniae [K. pneumoniae] as the cause of diseases classified elsewhere: Secondary | ICD-10-CM | POA: Diagnosis present

## 2025-01-07 DIAGNOSIS — I272 Pulmonary hypertension, unspecified: Secondary | ICD-10-CM | POA: Diagnosis present

## 2025-01-07 DIAGNOSIS — Z59868 Other specified financial insecurity: Secondary | ICD-10-CM

## 2025-01-07 LAB — CBC
HCT: 35.4 % — ABNORMAL LOW (ref 36.0–46.0)
Hemoglobin: 11.5 g/dL — ABNORMAL LOW (ref 12.0–15.0)
MCH: 33.1 pg (ref 26.0–34.0)
MCHC: 32.5 g/dL (ref 30.0–36.0)
MCV: 102 fL — ABNORMAL HIGH (ref 80.0–100.0)
Platelets: 182 K/uL (ref 150–400)
RBC: 3.47 MIL/uL — ABNORMAL LOW (ref 3.87–5.11)
RDW: 15.5 % (ref 11.5–15.5)
WBC: 11 K/uL — ABNORMAL HIGH (ref 4.0–10.5)
nRBC: 0 % (ref 0.0–0.2)

## 2025-01-07 LAB — PROTIME-INR
INR: 1 (ref 0.8–1.2)
Prothrombin Time: 13.8 s (ref 11.4–15.2)

## 2025-01-07 LAB — BASIC METABOLIC PANEL WITH GFR
Anion gap: 16 — ABNORMAL HIGH (ref 5–15)
BUN: 51 mg/dL — ABNORMAL HIGH (ref 8–23)
CO2: 24 mmol/L (ref 22–32)
Calcium: 10.5 mg/dL — ABNORMAL HIGH (ref 8.9–10.3)
Chloride: 99 mmol/L (ref 98–111)
Creatinine, Ser: 2.35 mg/dL — ABNORMAL HIGH (ref 0.44–1.00)
GFR, Estimated: 20 mL/min — ABNORMAL LOW
Glucose, Bld: 178 mg/dL — ABNORMAL HIGH (ref 70–99)
Potassium: 5.1 mmol/L (ref 3.5–5.1)
Sodium: 138 mmol/L (ref 135–145)

## 2025-01-07 LAB — BLOOD GAS, VENOUS
Acid-Base Excess: 5.2 mmol/L — ABNORMAL HIGH (ref 0.0–2.0)
Bicarbonate: 29.9 mmol/L — ABNORMAL HIGH (ref 20.0–28.0)
O2 Saturation: 60.6 %
Patient temperature: 37
pCO2, Ven: 43 mmHg — ABNORMAL LOW (ref 44–60)
pH, Ven: 7.45 — ABNORMAL HIGH (ref 7.25–7.43)
pO2, Ven: 37 mmHg (ref 32–45)

## 2025-01-07 LAB — GLUCOSE, CAPILLARY
Glucose-Capillary: 122 mg/dL — ABNORMAL HIGH (ref 70–99)
Glucose-Capillary: 116 mg/dL — ABNORMAL HIGH (ref 70–99)

## 2025-01-07 LAB — RESP PANEL BY RT-PCR (RSV, FLU A&B, COVID)  RVPGX2
Influenza A by PCR: NEGATIVE
Influenza B by PCR: NEGATIVE
Resp Syncytial Virus by PCR: NEGATIVE
SARS Coronavirus 2 by RT PCR: NEGATIVE

## 2025-01-07 LAB — URINALYSIS, ROUTINE W REFLEX MICROSCOPIC
Bilirubin Urine: NEGATIVE
Glucose, UA: NEGATIVE mg/dL
Ketones, ur: NEGATIVE mg/dL
Nitrite: NEGATIVE
Protein, ur: NEGATIVE mg/dL
Specific Gravity, Urine: 1.01 (ref 1.005–1.030)
pH: 5 (ref 5.0–8.0)

## 2025-01-07 LAB — TROPONIN T, HIGH SENSITIVITY
Troponin T High Sensitivity: 53 ng/L — ABNORMAL HIGH (ref 0–19)
Troponin T High Sensitivity: 51 ng/L — ABNORMAL HIGH (ref 0–19)

## 2025-01-07 LAB — PRO BRAIN NATRIURETIC PEPTIDE: Pro Brain Natriuretic Peptide: 10552 pg/mL — ABNORMAL HIGH

## 2025-01-07 LAB — MAGNESIUM: Magnesium: 2.6 mg/dL — ABNORMAL HIGH (ref 1.7–2.4)

## 2025-01-07 MED ORDER — ACETAMINOPHEN 325 MG PO TABS
650.0000 mg | ORAL_TABLET | Freq: Four times a day (QID) | ORAL | Status: DC | PRN
  Administered 2025-01-08 – 2025-01-10 (×4): 650 mg via ORAL
  Filled 2025-01-07 (×4): qty 2

## 2025-01-07 MED ORDER — FUROSEMIDE 10 MG/ML IJ SOLN
20.0000 mg | Freq: Once | INTRAMUSCULAR | Status: AC
  Administered 2025-01-07: 20 mg via INTRAVENOUS
  Filled 2025-01-07: qty 4

## 2025-01-07 MED ORDER — ONDANSETRON HCL 4 MG/2ML IJ SOLN: 4.0000 mg | Freq: Four times a day (QID) | INTRAMUSCULAR | Status: DC | PRN

## 2025-01-07 MED ORDER — OLANZAPINE 10 MG IM SOLR
2.5000 mg | INTRAMUSCULAR | Status: DC | PRN
  Administered 2025-01-07: 2.5 mg via INTRAMUSCULAR
  Filled 2025-01-07 (×2): qty 10

## 2025-01-07 MED ORDER — SODIUM CHLORIDE 0.9 % IV SOLN
1.0000 g | INTRAVENOUS | Status: DC
  Administered 2025-01-07 – 2025-01-09 (×3): 1 g via INTRAVENOUS
  Filled 2025-01-07 (×4): qty 10

## 2025-01-07 MED ORDER — FUROSEMIDE 10 MG/ML IJ SOLN
40.0000 mg | Freq: Once | INTRAMUSCULAR | Status: AC
  Administered 2025-01-07: 40 mg via INTRAVENOUS
  Filled 2025-01-07: qty 4

## 2025-01-07 MED ORDER — SENNOSIDES-DOCUSATE SODIUM 8.6-50 MG PO TABS: 1.0000 | ORAL_TABLET | Freq: Every evening | ORAL | Status: DC | PRN

## 2025-01-07 MED ORDER — INSULIN ASPART 100 UNIT/ML IJ SOLN: 0.0000 [IU] | Freq: Every day | INTRAMUSCULAR | Status: DC

## 2025-01-07 MED ORDER — ONDANSETRON HCL 4 MG PO TABS: 4.0000 mg | ORAL_TABLET | Freq: Four times a day (QID) | ORAL | Status: DC | PRN

## 2025-01-07 MED ORDER — INSULIN ASPART 100 UNIT/ML IJ SOLN
0.0000 [IU] | Freq: Three times a day (TID) | INTRAMUSCULAR | Status: DC
  Administered 2025-01-07 – 2025-01-10 (×4): 1 [IU] via SUBCUTANEOUS
  Filled 2025-01-07 (×5): qty 1

## 2025-01-07 MED ORDER — HEPARIN SODIUM (PORCINE) 5000 UNIT/ML IJ SOLN
5000.0000 [IU] | Freq: Three times a day (TID) | INTRAMUSCULAR | Status: DC
  Administered 2025-01-07 – 2025-01-10 (×10): 5000 [IU] via SUBCUTANEOUS
  Filled 2025-01-07 (×10): qty 1

## 2025-01-07 MED ORDER — SODIUM CHLORIDE 0.9% FLUSH
3.0000 mL | Freq: Two times a day (BID) | INTRAVENOUS | Status: DC
  Administered 2025-01-07 – 2025-01-10 (×7): 3 mL via INTRAVENOUS

## 2025-01-07 MED ORDER — ACETAMINOPHEN 650 MG RE SUPP: 650.0000 mg | Freq: Four times a day (QID) | RECTAL | Status: DC | PRN

## 2025-01-07 NOTE — ED Provider Notes (Signed)
 "  [facility] Provider Note    Event Date/Time   First MD Initiated Contact with Patient 01/07/25 1220     (approximate)   History   Chest Pain  Arrived by [facility] from home. Reports she does not feel well. Lives with husband  Told staff upon arrival at ER she has chest pain.   Patient arrived in left arm sling. All family could report is that patient has fracture  EMS vitals: 76HR 102/50 b/p 94% RA 254CBG 98.4oral  History dementia. Takes lasix  for bilateral leg swelling   Alert to self and place    HPI [name] [name] is a 89 y.o. female PMH dementia, atrial flutter not on anticoagulation, CHF, dementia, pacemaker in place, hypertension, hyperlipidemia presents for evaluation of chest discomfort, fatigue - Patient is a limited historian, not able to provide any relevant details.  Per family at bedside, she has been increasingly sleepy and confused since her recent fall on 1/13.  No recent infectious symptoms.  Is sleeping most of the day.  Called in nursing advice line and were told to be seen in the emergency department.  Per chart review, patient was seen in our emergency department on 01/01/2025 after a fall.  Found to have proximal humerus fracture.  Placed in sling, plan for outpatient follow-up.  Has Ortho follow-up scheduled on 01/10/2025.     Physical Exam   Triage Vital Signs: ED Triage Vitals  Encounter Vitals Group     BP 01/07/25 1148 (!) 117/51     Girls Systolic BP Percentile --      Girls Diastolic BP Percentile --      Boys Systolic BP Percentile --      Boys Diastolic BP Percentile --      Pulse Rate 01/07/25 1148 62     Resp 01/07/25 1148 20     Temp 01/07/25 1148 97.9 F (36.6 C)     Temp Source 01/07/25 1148 Oral     SpO2 01/07/25 1148 93 %     Weight 01/07/25 1145 139 lb (63 kg)     Height 01/07/25 1145 5' 3 (1.6 m)     Head Circumference --      Peak Flow --      Pain Score --      Pain Loc --      Pain  Education --      Exclude from Growth Chart --     Most recent vital signs: Vitals:   01/07/25 1148 01/07/25 1505  BP: (!) 117/51 (!) 114/51  Pulse: 62 65  Resp: 20 16  Temp: 97.9 F (36.6 C)   SpO2: 93% 95%     General: Sleepy but arousable, no distress.  HEENT: Contusion to right cheek, no tenderness in this area, otherwise atraumatic, normocephalic.  No midline neck pain and able to range without difficulty. CV:  Good peripheral perfusion. RRR, RP 2+. + 2+ BLE edema. Resp:  Mild tachypnea, somewhat diminished breath sounds at bases Abd:  No distention. Nontender to deep palpation throughout    ED Results / Procedures / Treatments   Labs (all labs ordered are listed, but only abnormal results are displayed) Labs Reviewed  BASIC METABOLIC PANEL WITH GFR - Abnormal; Notable for the following components:      Result Value   Glucose, Bld 178 (*)    BUN 51 (*)    Creatinine, Ser 2.35 (*)    Calcium 10.5 (*)    GFR, Estimated  20 (*)    Anion gap 16 (*)    All other components within normal limits  CBC - Abnormal; Notable for the following components:   WBC 11.0 (*)    RBC 3.47 (*)    Hemoglobin 11.5 (*)    HCT 35.4 (*)    MCV 102.0 (*)    All other components within normal limits  URINALYSIS, ROUTINE W REFLEX MICROSCOPIC - Abnormal; Notable for the following components:   Color, Urine YELLOW (*)    APPearance CLOUDY (*)    Hgb urine dipstick MODERATE (*)    Leukocytes,Ua TRACE (*)    Bacteria, UA MANY (*)    All other components within normal limits  PRO BRAIN NATRIURETIC PEPTIDE - Abnormal; Notable for the following components:   Pro Brain Natriuretic Peptide 10,552.0 (*)    All other components within normal limits  BLOOD GAS, VENOUS - Abnormal; Notable for the following components:   pH, Ven 7.45 (*)    pCO2, Ven 43 (*)    Bicarbonate 29.9 (*)    Acid-Base Excess 5.2 (*)    All other components within normal limits  TROPONIN T, HIGH SENSITIVITY - Abnormal;  Notable for the following components:   Troponin T High Sensitivity 53 (*)    All other components within normal limits  TROPONIN T, HIGH SENSITIVITY - Abnormal; Notable for the following components:   Troponin T High Sensitivity 51 (*)    All other components within normal limits  RESP PANEL BY RT-PCR (RSV, FLU A&B, COVID)  RVPGX2  URINE CULTURE  PROTIME-INR  BASIC METABOLIC PANEL WITH GFR  MAGNESIUM      EKG  Ecg = paced rhythm, rate 61, no clear evidence of ischemia   RADIOLOGY Chest x-ray with cardiomegaly and pulmonary vascular congestion pattern on my interpretation and radiology report.  CT head unremarkable.    PROCEDURES:  Critical Care performed: No  Procedures   MEDICATIONS ORDERED IN ED: Medications  insulin  aspart (novoLOG ) injection 0-9 Units (has no administration in time range)  insulin  aspart (novoLOG ) injection 0-5 Units (has no administration in time range)  sodium chloride  flush (NS) 0.9 % injection 3 mL (has no administration in time range)  acetaminophen  (TYLENOL ) tablet 650 mg (has no administration in time range)    Or  acetaminophen  (TYLENOL ) suppository 650 mg (has no administration in time range)  senna-docusate (Senokot-S) tablet 1 tablet (has no administration in time range)  heparin  injection 5,000 Units (has no administration in time range)  ondansetron  (ZOFRAN ) tablet 4 mg (has no administration in time range)    Or  ondansetron  (ZOFRAN ) injection 4 mg (has no administration in time range)  furosemide  (LASIX ) injection 20 mg (20 mg Intravenous Given 01/07/25 1305)  furosemide  (LASIX ) injection 40 mg (40 mg Intravenous Given 01/07/25 1440)     IMPRESSION / MDM / ASSESSMENT AND PLAN / ED COURSE  I reviewed the triage vital signs and the nursing notes.                              DDX/MDM/AP: Differential diagnosis includes, but is not limited to, consider intracranial hemorrhage, no clear findings to suggest CVA, consider infectious  or metabolic encephalopathy.  Does also have notable bilateral lower extremity edema and some tachypnea here, suspect some component of heart failure.  Consider viral syndrome, UTI, pneumonia.  Plan: - Labs - Chest x-ray - EKG - CT head - Low threshold for admission  Patient's presentation is most consistent with acute presentation with potential threat to life or bodily function.  The patient is on the cardiac monitor to evaluate for evidence of arrhythmia and/or significant heart rate changes.  ED course below.  Workup consistent with CHF exacerbation.  Also with notable AKI which I suspect is due to hypervolemia.  Admitted to hospitalist service.  Urinalysis subsequently concerning for UTI  Clinical Course as of 01/07/25 1547  Mon Jan 07, 2025  1245 BMP with notable AKI [MM]  1246 CXR: IMPRESSION: Mild cardiomegaly with interval worsening of pulmonary vascular congestion suspicious for CHF/fluid volume overload.   [MM]  1251 CBC with mild leukocytosis, stable mild anemia [MM]  1320 Troponin very mildly elevated, suspect type II NSTEMI in the setting of CHF exacerbation  BNP markedly elevated [MM]  1407 Viral swab neg [MM]  1412 CT head with no obvious hemorrhage on my interpretation, formal read pending [MM]  1442 CXR: IMPRESSION: 1. No acute intracranial abnormality. 2. Mild chronic small vessel ischemic disease.   [MM]  1443 Urine collected  Hospitalist consult order placed [MM]    Clinical Course User Index [MM] Clarine Ozell LABOR, MD     FINAL CLINICAL IMPRESSION(S) / ED DIAGNOSES   Final diagnoses:  Acute on chronic congestive heart failure, unspecified heart failure type (HCC)  Altered mental status, unspecified altered mental status type     Rx / DC Orders   ED Discharge Orders     None        Note:  This document was prepared using Dragon voice recognition software and may include unintentional dictation errors.   Clarine Ozell LABOR, MD 01/07/25  1547  "

## 2025-01-07 NOTE — Patient Outreach (Signed)
 RNCM - husband calling to cancel appointment in office tomorrow. Difficulty communicating over the phone due to husband severely HOH. Stated patient haywire the last few days - reports sleeping all the time, not eating, swelling in legs and he doubled up on water pill. Advised to call 911, declined for RNCM to call family, agreed to call 911. PCP notified

## 2025-01-07 NOTE — H&P (Addendum)
 " History and Physical    [name] [name] FMW:[id number] DOB: [birth year] DOA: 01/07/2025  DOS: the patient was seen and examined on 01/07/2025  PCP: Marylynn Verneita CROME, MD   Patient coming from: Home  I have personally briefly reviewed patient's old medical records in [facility] and CareEverywhere  HPI:   [name] [name] is a 89 y.o. year old female with medical history of hypertension, hyperlipidemia, type 2 diabetes, CHF (normal EF and 06/24), atrial fibrillation, status post pacemaker, MDD, GAD presenting to the ED with worsening somnolence.  Patient unable to contribute to history given her dementia and family not present at bedside on my evaluation.  I attempted to call husband and son x 2 but was not successful.  Per EDP note family reports patient has been increasingly somnolent and more confused than baseline.  On my evaluation she is able to tell me her name and her date of birth but unable to tell me time or place.  She continues to repeat she wants to go home.  Will continue to reach out to family to discuss factors leading up to her presentation to the ED. On arrival to the ED patient was noted to be HDS stable.  Lab work and imaging obtained.  CBC with mild leukocytosis, mild anemia near baseline.  Anemia is macrocytic.  BMP with an AKI, mild hypercalcemia.  Troponin mildly elevated but stable.  UA with signs of infection.  Urine culture ordered.  proBNP elevated around 10K.  CT head without any acute finding, chest x-ray with mild cardiomegaly and worsening pulmonary vascular congestion. Need for further care, [facility] contacted for admission.  Review of Systems: unable to review all systems due to the inability of the patient to answer questions.   Past Medical History:  Diagnosis Date   Actinic keratosis    Arthritis    Atrial flutter (HCC) 02/2011   s/p cardioversion    Chest pain    a. H/o cardiac cath x 2-> 2012 -->nl cors;  b. 12/2016 MV: EF 61%, small region of mild  perfusion defect in the apical anteroseptal region c/w breast attenuation, no ischemia-->Low risk; c. 06/2019 MV: Mod size, mild inflat ischemia, EF 59%. Cor and Ao Ca2+. Inflat defect more pronounced on this study compared to last; c. 07/2019 Cath: LM nl, LAD min irregs, LCX nl, OM1/2/3 nl, RCA min irregs.   CKD (chronic kidney disease), stage III (HCC)    Concussion with no loss of consciousness 09/01/2016   Cystocele    Degenerative disorder of bone    Dementia (HCC)    GERD (gastroesophageal reflux disease)    Headache(784.0)    chronic   Hiatal hernia    Hyperlipidemia    Hypertension    Influenza 01/10/2017   Knee fracture    Migraines    Mobitz type 2 second degree atrioventricular block    a. felt to be 2/2 amiodarone , resolved with decreased amiodarone  dose.  Amio since d/c'd.   Permanent atrial fibrillation (HCC)    a. status post multiple DCCVs; b. 2018 - eval for PVI but opted for rate control.   PONV (postoperative nausea and vomiting)    oxycodone and codiene cause N/V    Pre-syncope    a. In setting of dehydration and AKI in the past.   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 Conway, plan from Radiation Oncologist is  to start Radiation therapy. PET scan 05/31/24 No focal, avid cutaneous lesions are visualized. No evidence of FDG avid metastatic disease. The primary lesion may not be FDG avid.   Squamous cell carcinoma of skin 11/10/2020   left distal posterior deltoid (EDC 01/15/2021)   Tachycardia induced cardiomyopathy (HCC)    a. Resolved;  b. 08/2017 Echo: EF 50-55%, no rwma, mild MR, mildly to mod dil LA/RA; c. 02/2018 Echo: EF 55-60%, mild MR. Mildly dil LA. Nl RVSP. PASP .   Venous insufficiency    Vertigo     Past Surgical History:  Procedure Laterality Date   ABDOMINAL HYSTERECTOMY  1990   APPENDECTOMY     AUGMENTATION MAMMAPLASTY Bilateral 1986    implants   AUGMENTATION MAMMAPLASTY  1990   AUGMENTATION MAMMAPLASTY  2011   CARDIAC CATHETERIZATION     CARDIOVERSION     x 3   CARDIOVERSION     CARDIOVERSION N/A 02/07/2017   Procedure: CARDIOVERSION;  Surgeon: Deatrice DELENA Cage, MD;  Location: [facility] ORS;  Service: Cardiovascular;  Laterality: N/A;   CARDIOVERSION N/A 07/22/2017   Procedure: Cardioversion;  Surgeon: Perla Evalene PARAS, MD;  Location: [facility];  Service: Cardiovascular;  Laterality: N/A;   CATARACT EXTRACTION W/PHACO Right 09/21/2016   Procedure: CATARACT EXTRACTION PHACO AND INTRAOCULAR LENS PLACEMENT (IOC);  Surgeon: Elsie Carmine, MD;  Location: [facility];  Service: Ophthalmology;  Laterality: Right;  US  44.1 AP% 16.5 CDE 7.30 Fluid Pack Lot #[id number]   CATARACT EXTRACTION W/PHACO Left 10/19/2016   Procedure: CATARACT EXTRACTION PHACO AND INTRAOCULAR LENS PLACEMENT (IOC);  Surgeon: Elsie Carmine, MD;  Location: [facility];  Service: Ophthalmology;  Laterality: Left;  US  53.7 AP% 19.5 CDE 10.45 Fluid pack lot # [id number]   CHOLECYSTECTOMY     COMBINED AUGMENTATION MAMMAPLASTY AND ABDOMINOPLASTY     JOINT REPLACEMENT Left 06/04/2013   left knee   KNEE ARTHROSCOPY Right 08/16/2016   Procedure: ARTHROSCOPY KNEE, tear posterior horn medial meniscus, tear anterior and posterior horns of lateral meniscus, chondromalacia of lateral compartment grade 3 patella and grade 4 medial;  Surgeon: Lynwood SHAUNNA Hue, MD;  Location: [facility];  Service: Orthopedics;  Laterality: Right;   LEFT HEART CATH AND CORONARY ANGIOGRAPHY Left 07/27/2019   Procedure: LEFT HEART CATH AND CORONARY ANGIOGRAPHY;  Surgeon: Cage Deatrice DELENA, MD;  Location: [facility];  Service: Cardiovascular;  Laterality: Left;   MASTECTOMY  1986   nipple sparing mastectomy/Bilateral with silicone  breast implants, s/p saline replacements   Multiple orthopedic procedures     NOSE SURGERY     PACEMAKER IMPLANT N/A 04/13/2023   Procedure: PACEMAKER IMPLANT;   Surgeon: Fernande Elspeth BROCKS, MD;  Location: [facility];  Service: Cardiovascular;  Laterality: N/A;   TEE WITHOUT CARDIOVERSION N/A 09/26/2017   Procedure: TRANSESOPHAGEAL ECHOCARDIOGRAM (TEE);  Surgeon: Okey Vina GAILS, MD;  Location: [facility] ENDOSCOPY;  Service: Cardiovascular;  Laterality: N/A;   TOTAL KNEE ARTHROPLASTY Left      Allergies[1]  Family History  Problem Relation Age of Onset   Heart disease Mother    Stomach cancer Father    Breast cancer Sister 2   Breast cancer Sister 87   Breast cancer Sister 27   Breast cancer Sister    Breast cancer Maternal Grandmother    Diabetes Brother    Esophageal cancer Brother    Kidney failure Brother    Heart disease Son        found at autopsy   Ovarian cancer Neg Hx  Prior to Admission medications  Medication Sig Start Date End Date Taking? Authorizing Provider  acetaminophen  (TYLENOL ) 500 MG tablet Take 1,000 mg by mouth every 6 (six) hours as needed (pain).    Yes [provider]  amiodarone  (PACERONE ) 200 MG tablet Take 200 mg by mouth daily.   Yes [provider]  Carboxymethylcellul-Glycerin (LUBRICATING EYE DROPS OP) Place 1 drop into both eyes daily as needed (dry eyes).   Yes [provider]  cholecalciferol  (VITAMIN D ) 1000 units tablet Take 1,000 Units by mouth daily.   Yes [provider]  cyanocobalamin  (VITAMIN B12) 500 MCG tablet Take 500 mcg by mouth daily.   Yes [provider]  donepezil  (ARICEPT  ODT) 10 MG disintegrating tablet Take 1 tablet by mouth at bedtime. 03/08/24 03/08/25 Yes [provider]  metoprolol  succinate (TOPROL -XL) 25 MG 24 hr tablet Take 1 tablet (25 mg total) by mouth daily. 07/04/24  Yes Furth, Cadence H, PA-C  mupirocin  ointment (BACTROBAN ) 2 % Apply 1 Application topically 2 (two) times daily. Apply to wound and cover with bandage until healed. Patient taking differently: Apply 1 Application topically 2 (two) times daily. Apply to wound  and cover with bandage until healed. As needed 05/02/24  Yes Hester Alm BROCKS, MD  pantoprazole  (PROTONIX ) 40 MG tablet Take 1 tablet (40 mg total) by mouth daily. 10/24/24  Yes Marylynn Verneita CROME, MD  potassium chloride  SA (KLOR-CON  M) 20 MEQ tablet Take 1 tablet (20 mEq total) by mouth daily. Take with torsemide  06/21/24  Yes Riddle, Suzann, NP  pravastatin  (PRAVACHOL ) 20 MG tablet TAKE 1 TABLET BY MOUTH DAILY 08/01/24  Yes Darron Deatrice LABOR, MD  sodium chloride  (OCEAN) 0.65 % SOLN nasal spray Place 1 spray into both nostrils daily as needed for congestion.   Yes [provider]  torsemide  (DEMADEX ) 20 MG tablet Take 1 tablet (20 mg total) by mouth daily. 10/04/24  Yes Darron Deatrice LABOR, MD  amoxicillin  (AMOXIL ) 500 MG capsule Take 500 mg by mouth every 6 (six) hours. Patient not taking: Reported on 01/07/2025 12/27/24   [provider]    Social History:  reports that she has never smoked. She has never used smokeless tobacco. She reports that she does not drink alcohol  and does not use drugs.    Physical Exam: Vitals:   01/07/25 1145 01/07/25 1148 01/07/25 1505  BP:  (!) 117/51 (!) 114/51  Pulse:  62 65  Resp:  20 16  Temp:  97.9 F (36.6 C)   TempSrc:  Oral   SpO2:  93% 95%  Weight: 63 kg    Height: 5' 3 (1.6 m)      Gen: NAD, elderly female in pleasant mood HENT: NCAT, nasal cannula present CV: Regular rate and rhythm, bradycardic rate, 2+ pitting edema Lung: Bibasilar rales Abd: No TTP, normal bowel sounds MSK: No asymmetry, good bulk and tone Neuro: alert but oriented x 1.  Moving all extremities.  No focal deficits.   Labs on Admission: I have personally reviewed following labs and imaging studies  CBC: Recent Labs  Lab 01/07/25 1147  WBC 11.0*  HGB 11.5*  HCT 35.4*  MCV 102.0*  PLT 182   Basic Metabolic Panel: Recent Labs  Lab 01/07/25 1147  NA 138  K 5.1  CL 99  CO2 24  GLUCOSE 178*  BUN 51*  CREATININE 2.35*  CALCIUM 10.5*    GFR: Estimated Creatinine Clearance: 15.4 mL/min (A) (by C-G formula based on SCr of 2.35 mg/dL (  H)). Liver Function Tests: No results for input(s): AST, ALT, ALKPHOS, BILITOT, PROT, ALBUMIN in the last 168 hours. No results for input(s): LIPASE, AMYLASE in the last 168 hours. No results for input(s): AMMONIA in the last 168 hours. Coagulation Profile: Recent Labs  Lab 01/07/25 1147  INR 1.0   Cardiac Enzymes: No results for input(s): CKTOTAL, CKMB, CKMBINDEX, TROPONINI, TROPONINIHS in the last 168 hours. BNP (last 3 results) Recent Labs    06/21/24 1438  BNP 534.8*   HbA1C: No results for input(s): HGBA1C in the last 72 hours. CBG: No results for input(s): GLUCAP in the last 168 hours. Lipid Profile: No results for input(s): CHOL, HDL, LDLCALC, TRIG, CHOLHDL, LDLDIRECT in the last 72 hours. Thyroid  Function Tests: No results for input(s): TSH, T4TOTAL, FREET4, T3FREE, THYROIDAB in the last 72 hours. Anemia Panel: No results for input(s): VITAMINB12, FOLATE, FERRITIN, TIBC, IRON, RETICCTPCT in the last 72 hours. Urine analysis:    Component Value Date/Time   COLORURINE YELLOW (A) 01/07/2025 1444   APPEARANCEUR CLOUDY (A) 01/07/2025 1444   APPEARANCEUR Clear 09/14/2014 0334   LABSPEC 1.010 01/07/2025 1444   LABSPEC 1.006 09/14/2014 0334   PHURINE 5.0 01/07/2025 1444   GLUCOSEU NEGATIVE 01/07/2025 1444   GLUCOSEU NEGATIVE 11/02/2016 1024   HGBUR MODERATE (A) 01/07/2025 1444   BILIRUBINUR NEGATIVE 01/07/2025 1444   BILIRUBINUR negative 10/11/2017 1247   BILIRUBINUR Negative 09/14/2014 0334   KETONESUR NEGATIVE 01/07/2025 1444   PROTEINUR NEGATIVE 01/07/2025 1444   UROBILINOGEN 0.2 10/11/2017 1247   UROBILINOGEN 0.2 11/02/2016 1024   NITRITE NEGATIVE 01/07/2025 1444   LEUKOCYTESUR TRACE (A) 01/07/2025 1444   LEUKOCYTESUR Trace 09/14/2014 0334    Radiological Exams on Admission: I have personally  reviewed images CT HEAD WO CONTRAST ( ) Result Date: 01/07/2025 EXAM: CT HEAD WITHOUT CONTRAST 01/07/2025 02:12:47 PM TECHNIQUE: CT of the head was performed without the administration of intravenous contrast. Automated exposure control, iterative reconstruction, and/or weight based adjustment of the mA/kV was utilized to reduce the radiation dose to as low as reasonably achievable. COMPARISON: Head CT 11/26/2024. CLINICAL HISTORY: fall 1 wk ago, worsening AMS FINDINGS: BRAIN AND VENTRICLES: There is no evidence of an acute infarct, intracranial hemorrhage, mass, midline shift, hydrocephalus, or extra-axial fluid collection. There is mild cerebral atrophy. Cerebral white matter hypodensities are unchanged and nonspecific but compatible with mild chronic small vessel ischemic disease. ORBITS: Bilateral cataract extraction. SINUSES: Small mucous retention cyst in the right maxillary sinus. Small right and trace left mastoid effusions. SOFT TISSUES AND SKULL: No acute soft tissue abnormality. No skull fracture. IMPRESSION: 1. No acute intracranial abnormality. 2. Mild chronic small vessel ischemic disease. Electronically signed by: Dasie Hamburg MD 01/07/2025 02:19 PM EST RP Workstation: [id number]   DG Chest 2 View Result Date: 01/07/2025 CLINICAL DATA:  Chest pain.  Hypertension.  Atrial fibrillation. EXAM: CHEST - 2 VIEW COMPARISON:  11/26/2024 FINDINGS: Unchanged mild cardiomegaly. Interval development of mild pulmonary vascular congestion. Mild bibasilar opacities likely due to atelectasis. Single lead LEFT chest wall pacemaker is unchanged in configuration. IMPRESSION: Mild cardiomegaly with interval worsening of pulmonary vascular congestion suspicious for CHF/fluid volume overload. Electronically Signed   By: Aliene Lloyd M.D.   On: 01/07/2025 12:34    EKG: My personal interpretation of EKG shows: Paced rhythm, no acute ST changes    Assessment/Plan Principal Problem:   Acute exacerbation of CHF  (congestive heart failure) (HCC) Active Problems:   Essential hypertension   Hyperlipidemia   Persistent atrial fibrillation (HCC)  Anxiety and depression   Pulmonary hypertension, moderate to severe (HCC)   Type II diabetes mellitus (HCC)   GERD without esophagitis   Cardiac pacemaker in situ   Congestive heart failure with cardiomyopathy (HCC)   Acute Heart Failure Exacerbation Pt with hx of CHF and signs of volume overload on exam and imaging suggestive of CHF exacerbation.  Patient has elevated creatinine concerning for venous congestion.  She is status post 60 mg of IV Lasix  in the ED.  Home regimen is torsemide  20 mg. Husband wants to see if this can be increased at discharged. Would recommend having prn order for increase in weight given her advanced age as she could also be over-diuresed.  - Admit to med-telemetry - Start IV Furosemide  40 mg BID  - Strict I&Os - Daily Weights  UTI: UA consistent with UTI and urine cultures ordered.  Will start ceftriaxone .  This may be leading to her increased somnolence but patient was fully alert on my evaluation.  AKI: Likely in setting of venous congestion.  Treating with IV Lasix  as above.  Repeat BMP for this evening ordered.  If improving will continue IV diuresis.  If worsening will order renal ultrasound and consult nephrology.  Hypertension: Patient's home regimen is a diuretic.  She is getting IV diuresis.  Blood pressure is normotensive.  Will continue to monitor.  GERD: Continue home PPI.  Dementia: Baseline unknown but per EDP signout it appears to be severe.  Patient is oriented to self only.  Will discuss with family regarding her mental status to see if it is worsened which may be the case given urinary tract infection.  Delirium precautions ordered.  Atrial fibrillation: In sinus rhythm.  EKG shows paced rhythm.  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  She also  follows with dermatology.  Continue outpatient follow-ups for this.  Type 2 diabetes: Appears to be diet controlled.  Placed on SSI.  VTE prophylaxis:  Eliquis   Diet: Heart healthy Code Status:  Full Code Telemetry:  Admission status: Observation, Telemetry bed Patient is from: Home Anticipated d/c is to: Home Anticipated d/c is in: 1-2 days   Family Communication: Updated at bedside  Consults called: None   Severity of Illness: The appropriate patient status for this patient is OBSERVATION. Observation status is judged to be reasonable and necessary in order to provide the required intensity of service to ensure the patient's safety. The patient's presenting symptoms, physical exam findings, and initial radiographic and laboratory data in the context of their medical condition is felt to place them at decreased risk for further clinical deterioration. Furthermore, it is anticipated that the patient will be medically stable for discharge from the hospital within 2 midnights of admission.    Morene Bathe, MD [facility]ol[facility] DEL. [facility]     [1]  Allergies Allergen Reactions   Cyclobenzaprine Other (See Comments)    Dropped blood pressure and heart rate, talking out of my mind   Eliquis  [Apixaban ] Other (See Comments)    RECURRENT FALLS,  WITH HEAD INJURIES (5 IN 5 MONTHS)   Iodine  Anaphylaxis    NO PROBLEMS WITH BETADINE    Biaxin [Clarithromycin] Nausea And Vomiting   Codeine Nausea And Vomiting   Digoxin  And Related Other (See Comments)    Fatigue, eye puffiness, hoarsness   Diltiazem  Other (See Comments)   Enalapril Other (See Comments)    unknown   Famotidine      Caused dizziness  Fluocinonide Other (See Comments)    Tingling sensation in head and redness to scalp.   Iodinated Contrast Media Other (See Comments)    Tachycardia   Iodine -131    Losartan  Potassium Other (See Comments)    Dizzy and headache   Omnicef [Cefdinir]    Oxycodone Nausea And Vomiting    Promethazine  Other (See Comments)    Unknown    Sertraline      Dizziness and weakness   Tizanidine Other (See Comments)    Other Reaction(s): Other (See Comments)    hypotension   Tizanidine Hcl Other (See Comments)    hypotension    Warfarin And Related     Bleeding    Xarelto  [Rivaroxaban ] Other (See Comments)    bleeding   "

## 2025-01-07 NOTE — ED Triage Notes (Addendum)
 Arrived by [facility] from home. Reports she does not feel well. Lives with husband  Told staff upon arrival at ER she has chest pain.   Patient arrived in left arm sling. All family could report is that patient has fracture  EMS vitals: 76HR 102/50 b/p 94% RA 254CBG 98.4oral  History dementia. Takes lasix  for bilateral leg swelling   Alert to self and place

## 2025-01-08 ENCOUNTER — Telehealth

## 2025-01-08 ENCOUNTER — Observation Stay

## 2025-01-08 ENCOUNTER — Inpatient Hospital Stay

## 2025-01-08 DIAGNOSIS — B961 Klebsiella pneumoniae [K. pneumoniae] as the cause of diseases classified elsewhere: Secondary | ICD-10-CM | POA: Diagnosis present

## 2025-01-08 DIAGNOSIS — I509 Heart failure, unspecified: Secondary | ICD-10-CM | POA: Diagnosis not present

## 2025-01-08 DIAGNOSIS — N1831 Chronic kidney disease, stage 3a: Secondary | ICD-10-CM | POA: Diagnosis present

## 2025-01-08 DIAGNOSIS — N390 Urinary tract infection, site not specified: Secondary | ICD-10-CM | POA: Diagnosis present

## 2025-01-08 DIAGNOSIS — C4A4 Merkel cell carcinoma of scalp and neck: Secondary | ICD-10-CM | POA: Diagnosis not present

## 2025-01-08 DIAGNOSIS — N179 Acute kidney failure, unspecified: Secondary | ICD-10-CM | POA: Diagnosis present

## 2025-01-08 DIAGNOSIS — E1122 Type 2 diabetes mellitus with diabetic chronic kidney disease: Secondary | ICD-10-CM | POA: Diagnosis present

## 2025-01-08 DIAGNOSIS — C772 Secondary and unspecified malignant neoplasm of intra-abdominal lymph nodes: Secondary | ICD-10-CM | POA: Diagnosis present

## 2025-01-08 DIAGNOSIS — Z515 Encounter for palliative care: Secondary | ICD-10-CM | POA: Diagnosis not present

## 2025-01-08 DIAGNOSIS — F32A Depression, unspecified: Secondary | ICD-10-CM | POA: Diagnosis present

## 2025-01-08 DIAGNOSIS — D539 Nutritional anemia, unspecified: Secondary | ICD-10-CM | POA: Diagnosis present

## 2025-01-08 DIAGNOSIS — I4819 Other persistent atrial fibrillation: Secondary | ICD-10-CM | POA: Diagnosis not present

## 2025-01-08 DIAGNOSIS — I4821 Permanent atrial fibrillation: Secondary | ICD-10-CM | POA: Diagnosis present

## 2025-01-08 DIAGNOSIS — S42302A Unspecified fracture of shaft of humerus, left arm, initial encounter for closed fracture: Secondary | ICD-10-CM | POA: Diagnosis present

## 2025-01-08 DIAGNOSIS — K219 Gastro-esophageal reflux disease without esophagitis: Secondary | ICD-10-CM | POA: Diagnosis not present

## 2025-01-08 DIAGNOSIS — F0394 Unspecified dementia, unspecified severity, with anxiety: Secondary | ICD-10-CM | POA: Diagnosis present

## 2025-01-08 DIAGNOSIS — R4182 Altered mental status, unspecified: Secondary | ICD-10-CM | POA: Diagnosis not present

## 2025-01-08 DIAGNOSIS — I5033 Acute on chronic diastolic (congestive) heart failure: Secondary | ICD-10-CM | POA: Diagnosis present

## 2025-01-08 DIAGNOSIS — Z1611 Resistance to penicillins: Secondary | ICD-10-CM | POA: Diagnosis present

## 2025-01-08 DIAGNOSIS — C4A9 Merkel cell carcinoma, unspecified: Secondary | ICD-10-CM | POA: Diagnosis not present

## 2025-01-08 DIAGNOSIS — F0393 Unspecified dementia, unspecified severity, with mood disturbance: Secondary | ICD-10-CM | POA: Diagnosis present

## 2025-01-08 DIAGNOSIS — B9620 Unspecified Escherichia coli [E. coli] as the cause of diseases classified elsewhere: Secondary | ICD-10-CM | POA: Diagnosis present

## 2025-01-08 DIAGNOSIS — W1830XA Fall on same level, unspecified, initial encounter: Secondary | ICD-10-CM | POA: Diagnosis present

## 2025-01-08 DIAGNOSIS — R400 Somnolence: Secondary | ICD-10-CM | POA: Diagnosis present

## 2025-01-08 DIAGNOSIS — C7951 Secondary malignant neoplasm of bone: Secondary | ICD-10-CM | POA: Diagnosis present

## 2025-01-08 DIAGNOSIS — F03B4 Unspecified dementia, moderate, with anxiety: Secondary | ICD-10-CM | POA: Diagnosis not present

## 2025-01-08 DIAGNOSIS — C7972 Secondary malignant neoplasm of left adrenal gland: Secondary | ICD-10-CM | POA: Diagnosis present

## 2025-01-08 DIAGNOSIS — I10 Essential (primary) hypertension: Secondary | ICD-10-CM | POA: Diagnosis not present

## 2025-01-08 DIAGNOSIS — Z950 Presence of cardiac pacemaker: Secondary | ICD-10-CM | POA: Diagnosis not present

## 2025-01-08 DIAGNOSIS — G9341 Metabolic encephalopathy: Secondary | ICD-10-CM | POA: Diagnosis present

## 2025-01-08 DIAGNOSIS — I130 Hypertensive heart and chronic kidney disease with heart failure and stage 1 through stage 4 chronic kidney disease, or unspecified chronic kidney disease: Secondary | ICD-10-CM | POA: Diagnosis present

## 2025-01-08 DIAGNOSIS — C787 Secondary malignant neoplasm of liver and intrahepatic bile duct: Secondary | ICD-10-CM | POA: Diagnosis present

## 2025-01-08 DIAGNOSIS — E43 Unspecified severe protein-calorie malnutrition: Secondary | ICD-10-CM | POA: Diagnosis present

## 2025-01-08 DIAGNOSIS — S42202A Unspecified fracture of upper end of left humerus, initial encounter for closed fracture: Secondary | ICD-10-CM | POA: Diagnosis present

## 2025-01-08 DIAGNOSIS — C779 Secondary and unspecified malignant neoplasm of lymph node, unspecified: Secondary | ICD-10-CM | POA: Diagnosis not present

## 2025-01-08 DIAGNOSIS — I272 Pulmonary hypertension, unspecified: Secondary | ICD-10-CM | POA: Diagnosis present

## 2025-01-08 DIAGNOSIS — Z66 Do not resuscitate: Secondary | ICD-10-CM | POA: Diagnosis present

## 2025-01-08 DIAGNOSIS — W19XXXA Unspecified fall, initial encounter: Secondary | ICD-10-CM | POA: Diagnosis present

## 2025-01-08 LAB — BASIC METABOLIC PANEL WITH GFR
Anion gap: 15 (ref 5–15)
Anion gap: 16 — ABNORMAL HIGH (ref 5–15)
BUN: 52 mg/dL — ABNORMAL HIGH (ref 8–23)
BUN: 55 mg/dL — ABNORMAL HIGH (ref 8–23)
CO2: 25 mmol/L (ref 22–32)
CO2: 24 mmol/L (ref 22–32)
Calcium: 10.7 mg/dL — ABNORMAL HIGH (ref 8.9–10.3)
Calcium: 10.6 mg/dL — ABNORMAL HIGH (ref 8.9–10.3)
Chloride: 100 mmol/L (ref 98–111)
Chloride: 99 mmol/L (ref 98–111)
Creatinine, Ser: 2.25 mg/dL — ABNORMAL HIGH (ref 0.44–1.00)
Creatinine, Ser: 2.4 mg/dL — ABNORMAL HIGH (ref 0.44–1.00)
GFR, Estimated: 21 mL/min — ABNORMAL LOW
GFR, Estimated: 19 mL/min — ABNORMAL LOW
Glucose, Bld: 121 mg/dL — ABNORMAL HIGH (ref 70–99)
Glucose, Bld: 112 mg/dL — ABNORMAL HIGH (ref 70–99)
Potassium: 4.7 mmol/L (ref 3.5–5.1)
Potassium: 4.8 mmol/L (ref 3.5–5.1)
Sodium: 140 mmol/L (ref 135–145)
Sodium: 139 mmol/L (ref 135–145)

## 2025-01-08 LAB — GLUCOSE, CAPILLARY
Glucose-Capillary: 110 mg/dL — ABNORMAL HIGH (ref 70–99)
Glucose-Capillary: 135 mg/dL — ABNORMAL HIGH (ref 70–99)
Glucose-Capillary: 135 mg/dL — ABNORMAL HIGH (ref 70–99)
Glucose-Capillary: 126 mg/dL — ABNORMAL HIGH (ref 70–99)

## 2025-01-08 LAB — CBC
HCT: 35.2 % — ABNORMAL LOW (ref 36.0–46.0)
Hemoglobin: 11.5 g/dL — ABNORMAL LOW (ref 12.0–15.0)
MCH: 32.8 pg (ref 26.0–34.0)
MCHC: 32.7 g/dL (ref 30.0–36.0)
MCV: 100.3 fL — ABNORMAL HIGH (ref 80.0–100.0)
Platelets: 177 K/uL (ref 150–400)
RBC: 3.51 MIL/uL — ABNORMAL LOW (ref 3.87–5.11)
RDW: 15.6 % — ABNORMAL HIGH (ref 11.5–15.5)
WBC: 11.9 K/uL — ABNORMAL HIGH (ref 4.0–10.5)
nRBC: 0 % (ref 0.0–0.2)

## 2025-01-08 LAB — CK: Total CK: 69 U/L (ref 38–234)

## 2025-01-08 MED ORDER — SODIUM CHLORIDE 0.9 % IV SOLN: INTRAVENOUS | Status: AC

## 2025-01-08 NOTE — Progress Notes (Signed)
 " Progress Note   [name] FMW:[id number] DOB: [birth year] DOA: 01/07/2025     0 DOS: the patient was seen and examined on 01/08/2025   Brief hospital course:  [name] [name] is a 89 y.o. year old female with medical history of hypertension, hyperlipidemia, type 2 diabetes, CHF (normal EF and 06/24), atrial fibrillation, status post pacemaker, MDD, GAD presenting to the ED with worsening somnolence.  Patient unable to contribute to history given her dementia and family not present at bedside on my evaluation.  I attempted to call husband and son x 2 but was not successful.  Per EDP note family reports patient has been increasingly somnolent and more confused than baseline.  On my evaluation she is able to tell me her name and her date of birth but unable to tell me time or place.  She continues to repeat she wants to go home.  Will continue to reach out to family to discuss factors leading up to her presentation to the ED. On arrival to the ED patient was noted to be HDS stable.  Lab work and imaging obtained.  CBC with mild leukocytosis, mild anemia near baseline.  Anemia is macrocytic.  BMP with an AKI, mild hypercalcemia.  Troponin mildly elevated but stable.  UA with signs of infection.  Urine culture ordered.  proBNP elevated around 10K.  CT head without any acute finding, chest x-ray with mild cardiomegaly and worsening pulmonary vascular congestion. Need for further care, [facility] contacted for admission.   Review of Systems: unable to review all systems due to the inability of the patient to answer questions.   Assessment and Plan:  Acute metabolic encephalopathy AKI Hypercalcemia Probably secondary to uremia from AKI Patient remains lethargic and was brought in for evaluation of increased somnolence at home Labs on admission showed a bump in her serum creatinine from 1>>2.35 in 1 month BUN and calcium levels are elevated as well On exam patient is noted to have dry mucous  membranes Gentle IV fluid hydration Repeat electrolytes in a.m.   Urinary tract infection Patient with pyuria on admission and mental status changes Continue empiric antibiotic therapy with Rocephin  until urine culture results become available   Paroxysmal A-fib Amiodarone  and metoprolol  on hold due to bradycardia Monitor heart rate closely   Dementia Donepezil  is on hold due to mental status changes and bradycardia   Status post fall Left humeral fracture Patient is status post fall She was seen in the emergency room on 01/13 and had an x-ray which showed minimally displaced fracture of the anatomic neck of the left humerus Consult orthopedic surgery   Merkel cell carcinoma of the scalp Follow-up with oncology (surgical and radiation) as well as dermatology upon discharge      Subjective: Lethargic but arouses easily.  Husband at the bedside  Physical Exam: Vitals:   01/07/25 1931 01/08/25 0035 01/08/25 0347 01/08/25 0858  BP: (!) 115/52 92/65 (!) 113/42 (!) 118/48  Pulse: (!) 59 60 (!) 56 63  Resp: 16 20 14 18   Temp: 98.3 F (36.8 C) 98.5 F (36.9 C) 98.5 F (36.9 C) 99.3 F (37.4 C)  TempSrc: Oral  Oral   SpO2: 100% 96% 92% 94%  Weight:      Height:       Gen: Lethargic but arouses easily HENT: NCAT, nasal cannula present CV: Regular rate and rhythm, bradycardic Lung: Bilateral air entry Abd: Bowel sounds present, tender in the epigastrium and lower quadrant MSK: No asymmetry,  good bulk and tone Neuro: Lethargic but arouses easily.  Opens eyes to name call   Data Reviewed: Labs reviewed.  BUN 55, creatinine 2.40, calcium 10.6, white count 11.9 Labs reviewed  Family Communication: Plan of care discussed with patient's husband at the bedside.  He verbalizes understanding and agrees with the plan  Disposition: Status is: Inpatient Remains inpatient appropriate because: AKI  Planned Discharge Destination: Skilled nursing facility    Time spent:  50 minutes  Author: Aimee Somerset, MD 01/08/2025 12:23 PM  For on call review [email or web address].  "

## 2025-01-08 NOTE — Plan of Care (Signed)

## 2025-01-08 NOTE — Significant Event (Signed)
 Patient seen and examined during rounds, noted to have recent left humeral fracture following a fall. Obtained a CT scan of chest without contrast for further evaluation of abnormal chest x-ray and it showed No evidence of pneumonia.  Trace right pleural fluid. Large volume metastatic disease, including to the liver, bones, and likely lower thoracic nodes. Pulmonary artery enlargement suggests pulmonary arterial hypertension. Cardiomegaly. Coronary artery atherosclerosis. Aortic Atherosclerosis (ICD10-I70.0). Patient has a known history of Merkel cell carcinoma of the left temporal hairline status post radiation therapy.  She also has an AKI, hypercalcemia as well as anemia concerning for possible multiple myeloma Will obtain serum protein electrophoresis Will consult oncology Will consult nephrology if renal function/hypercalcemia does not improve with IV fluid hydration Follow-up results of CT scan of abdomen and pelvis ordered due to abdominal pain on palpation to rule out intra-abdominal pathology

## 2025-01-08 NOTE — Progress Notes (Signed)
 Bladder scan reveal 0ml. Patient gone for CT

## 2025-01-08 NOTE — Plan of Care (Signed)

## 2025-01-08 NOTE — Evaluation (Signed)
 Physical Therapy Evaluation Patient Details Name: [name] [name] MRN: [id number] DOB: 1936-09-02 Today's Date: 01/08/2025  History of Present Illness  Pt is an 89 y.o. year old female with medical history of dementia, merkel cell carcinoma of the scalp, HTN, HLD, type 2 diabetes, CHF (normal EF and 06/24), atrial fibrillation, status post pacemaker, MDD, GAD presenting to the ED with worsening somnolence. MD assessment includes: acute exacerbation of CHF, LUE proximal humeral fracture to be managed conservatively, UTI, and AKI.   Clinical Impression  Pt alert to first name only and required max multi-modal cuing for sequencing but still rarely able to follow 1-step commands.  Pt required +2 total assist with bed mobility tasks and significant +2 assist with transfers and limited ambulation.  Pt presented with poor standing balance with frequent assist to prevent LOB and is at a very high risk for further falls especially considering she can no longer use a RW for support in standing.  Pt will benefit from continued PT services upon discharge to safely address deficits listed in patient problem list for decreased caregiver assistance and eventual return to PLOF.         If plan is discharge home, recommend the following: Two people to help with walking and/or transfers;A lot of help with bathing/dressing/bathroom;Assistance with cooking/housework;Direct supervision/assist for medications management;Supervision due to cognitive status;Direct supervision/assist for financial management;Assist for transportation;Help with stairs or ramp for entrance   Can travel by private vehicle   No    Equipment Recommendations Other (comment) (TBD at next venue of care)  Recommendations for Other Services       Functional Status Assessment Patient has had a recent decline in their functional status and/or demonstrates limited ability to make significant improvements in function in a reasonable and  predictable amount of time     Precautions / Restrictions Precautions Precautions: Fall Recall of Precautions/Restrictions: Impaired Required Braces or Orthoses: Sling Restrictions Weight Bearing Restrictions Per Provider Order: Yes LUE Weight Bearing Per Provider Order: Non weight bearing Other Position/Activity Restrictions: LUE shoulder sling at all times, removing it only for bathing purposes; assuming NWB status with text chat sent to ortho MD to confirm      Mobility  Bed Mobility Overal bed mobility: Needs Assistance Bed Mobility: Supine to Sit, Sit to Supine     Supine to sit: Total assist, +2 for physical assistance Sit to supine: Total assist, +2 for physical assistance   General bed mobility comments: Pt offered very little assistance during sup to/from sit    Transfers Overall transfer level: Needs assistance Equipment used: 1 person hand held assist Transfers: Sit to/from Stand Sit to Stand: +2 physical assistance, Mod assist           General transfer comment: Max multi-modal cues to initiate movement with +2 mod A to come to standing and to prevent posterior LOB upon initial stand    Ambulation/Gait Ambulation/Gait assistance: Mod assist, +2 physical assistance Gait Distance (Feet): 3 Feet Assistive device: 1 person hand held assist Gait Pattern/deviations: Step-through pattern, Decreased step length - right, Decreased step length - left Gait velocity: decreased     General Gait Details: Pt able to take several very unsteady steps near the EOB with +2 mod A for stability and to guide the patient  Stairs            Wheelchair Mobility     Tilt Bed    Modified Rankin (Stroke Patients Only)       Balance Overall  balance assessment: Needs assistance, History of Falls   Sitting balance-Leahy Scale: Fair     Standing balance support: Single extremity supported, During functional activity Standing balance-Leahy Scale: Poor Standing  balance comment: Frequent assist for stability in standing                             Pertinent Vitals/Pain Pain Assessment Pain Assessment: PAINAD Breathing: normal Negative Vocalization: occasional moan/groan, low speech, negative/disapproving quality Facial Expression: smiling or inexpressive Body Language: relaxed Consolability: no need to console PAINAD Score: 1 Pain Intervention(s): Monitored during session, Repositioned    Home Living Family/patient expects to be discharged to:: Private residence Living Arrangements: Spouse/significant other Available Help at Discharge: Family;Available 24 hours/day Type of Home: House Home Access: Stairs to enter Entrance Stairs-Rails: None Entrance Stairs-Number of Steps: 1   Home Layout: Two level;Able to live on main level with bedroom/bathroom Home Equipment: Rolling Walker (2 wheels) Additional Comments: Very difficult to obtain history due to pt's dementia and spouse in room but very HOH as well as having some difficulty providing details    Prior Function               Mobility Comments: Pt able to amb with a RW at baseline but unsure of with how much assist, multiple fall history including recent fall leading to LUE humeral fracture ADLs Comments: Assist with ADLs     Extremity/Trunk Assessment   Upper Extremity Assessment Upper Extremity Assessment: Generalized weakness;LUE deficits/detail LUE: Unable to fully assess due to immobilization    Lower Extremity Assessment Lower Extremity Assessment: Generalized weakness       Communication   Communication Communication: Impaired Factors Affecting Communication: Difficulty expressing self    Cognition Arousal: Lethargic Behavior During Therapy: WFL for tasks assessed/performed   PT - Cognitive impairments: History of cognitive impairments                         Following commands: Impaired Following commands impaired: Follows one step  commands inconsistently     Cueing Cueing Techniques: Verbal cues, Tactile cues     General Comments      Exercises     Assessment/Plan    PT Assessment Patient needs continued PT services  PT Problem List Decreased strength;Decreased activity tolerance;Decreased balance;Decreased mobility;Decreased cognition;Decreased knowledge of use of DME;Decreased safety awareness;Decreased knowledge of precautions       PT Treatment Interventions DME instruction;Gait training;Functional mobility training;Therapeutic activities;Therapeutic exercise;Balance training;Patient/family education    PT Goals (Current goals can be found in the Care Plan section)  Acute Rehab PT Goals PT Goal Formulation: Patient unable to participate in goal setting Time For Goal Achievement: 01/21/25 Potential to Achieve Goals: Fair    Frequency Min 2X/week     Co-evaluation               AM-PAC PT 6 Clicks Mobility  Outcome Measure Help needed turning from your back to your side while in a flat bed without using bedrails?: Total Help needed moving from lying on your back to sitting on the side of a flat bed without using bedrails?: Total Help needed moving to and from a bed to a chair (including a wheelchair)?: Total Help needed standing up from a chair using your arms (e.g., wheelchair or bedside chair)?: A Lot Help needed to walk in hospital room?: Total Help needed climbing 3-5 steps with a railing? : Total 6 Click  Score: 7    End of Session Equipment Utilized During Treatment: Gait belt;Other (comment) (Sling to LUE) Activity Tolerance: Patient tolerated treatment well Patient left: in bed;with call bell/phone within reach;with bed alarm set;with nursing/sitter in room;with family/visitor present Nurse Communication: Mobility status;Weight bearing status PT Visit Diagnosis: History of falling (Z91.81);Unsteadiness on feet (R26.81);Repeated falls (R29.6);Muscle weakness (generalized)  (M62.81);Difficulty in walking, not elsewhere classified (R26.2)    Time: 9088-9069 (and 1109 - 1142 with break for procedure) PT Time Calculation (min) (ACUTE ONLY): 19 min   Charges:   PT Evaluation $PT Eval Moderate Complexity: 1 Mod PT Treatments $Therapeutic Activity: 8-22 mins PT General Charges $$ ACUTE PT VISIT: 1 Visit       D. Scott Jolaine Fryberger PT, DPT 01/08/25, 12:04 PM

## 2025-01-08 NOTE — Consult Note (Signed)
 "                                                                ORTHOPAEDIC CONSULTATION  REQUESTING PHYSICIAN: Lanetta Lingo, MD  Chief Complaint:   Left shoulder pain.  History of Present Illness: [name] [name] is an 89 y.o. 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  Apparently, the patient fell approximately 10 days ago, striking her arm against the wall.  She presented to the emergency room 1 week ago where x-rays demonstrated an essentially nondisplaced left proximal humerus fracture.  The patient was placed in a sling and sent home.  Apparently she has become more somnolent and difficult to arouse over the past few days, prompting her to be brought back to the emergency room where she subsequent has been admitted for further workup.  I been asked to consult on this patient for the left proximal humerus fracture.  Past Medical History:  Diagnosis Date   Actinic keratosis    Arthritis    Atrial flutter (HCC) 02/2011   s/p cardioversion    Chest pain    a. H/o cardiac cath x 2-> 2012 -->nl cors;  b. 12/2016 MV: EF 61%, small region of mild perfusion defect in the apical anteroseptal region c/w breast attenuation, no ischemia-->Low risk; c. 06/2019 MV: Mod size, mild inflat ischemia, EF 59%. Cor and Ao Ca2+. Inflat defect more pronounced on this study compared to last; c. 07/2019 Cath: LM nl, LAD min irregs, LCX nl, OM1/2/3 nl, RCA min irregs.   CKD (chronic kidney disease), stage III (HCC)    Concussion with no loss of consciousness 09/01/2016   Cystocele    Degenerative disorder of bone    Dementia (HCC)    GERD (gastroesophageal reflux disease)     Headache(784.0)    chronic   Hiatal hernia    Hyperlipidemia    Hypertension    Influenza 01/10/2017   Knee fracture    Migraines    Mobitz type 2 second degree atrioventricular block    a. felt to be 2/2 amiodarone , resolved with decreased amiodarone  dose.  Amio since d/c'd.   Permanent atrial fibrillation (HCC)    a. status post multiple DCCVs; b. 2018 - eval for PVI but opted for rate control.   PONV (postoperative nausea and vomiting)    oxycodone and codiene cause N/V    Pre-syncope    a. In setting of dehydration and AKI in the past.   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 Conway, plan from Radiation Oncologist is to start Radiation therapy. PET scan 05/31/24 No focal, avid cutaneous lesions are visualized. No evidence of FDG avid metastatic disease. The primary lesion may not be FDG avid.   Squamous cell carcinoma of skin 11/10/2020   left distal posterior deltoid (EDC 01/15/2021)   Tachycardia induced cardiomyopathy (HCC)    a. Resolved;  b. 08/2017 Echo: EF 50-55%, no rwma, mild MR, mildly to mod dil  LA/RA; c. 02/2018 Echo: EF 55-60%, mild MR. Mildly dil LA. Nl RVSP. PASP .   Venous insufficiency    Vertigo    Past Surgical History:  Procedure Laterality Date   ABDOMINAL HYSTERECTOMY  1990   APPENDECTOMY     AUGMENTATION MAMMAPLASTY Bilateral 1986   implants   AUGMENTATION MAMMAPLASTY  1990   AUGMENTATION MAMMAPLASTY  2011   CARDIAC CATHETERIZATION     CARDIOVERSION     x 3   CARDIOVERSION     CARDIOVERSION N/A 02/07/2017   Procedure: CARDIOVERSION;  Surgeon: Deatrice DELENA Cage, MD;  Location: [facility];  Service: Cardiovascular;  Laterality: N/A;   CARDIOVERSION N/A 07/22/2017   Procedure: Cardioversion;  Surgeon: Perla Evalene PARAS, MD;  Location: [facility];  Service: Cardiovascular;  Laterality: N/A;   CATARACT EXTRACTION W/PHACO Right 09/21/2016   Procedure: CATARACT  EXTRACTION PHACO AND INTRAOCULAR LENS PLACEMENT (IOC);  Surgeon: Elsie Carmine, MD;  Location: [facility];  Service: Ophthalmology;  Laterality: Right;  US  44.1 AP% 16.5 CDE 7.30 Fluid Pack Lot #[id number]   CATARACT EXTRACTION W/PHACO Left 10/19/2016   Procedure: CATARACT EXTRACTION PHACO AND INTRAOCULAR LENS PLACEMENT (IOC);  Surgeon: Elsie Carmine, MD;  Location: [facility];  Service: Ophthalmology;  Laterality: Left;  US  53.7 AP% 19.5 CDE 10.45 Fluid pack lot # [id number]   CHOLECYSTECTOMY     COMBINED AUGMENTATION MAMMAPLASTY AND ABDOMINOPLASTY     JOINT REPLACEMENT Left 06/04/2013   left knee   KNEE ARTHROSCOPY Right 08/16/2016   Procedure: ARTHROSCOPY KNEE, tear posterior horn medial meniscus, tear anterior and posterior horns of lateral meniscus, chondromalacia of lateral compartment grade 3 patella and grade 4 medial;  Surgeon: Lynwood SHAUNNA Hue, MD;  Location: [facility];  Service: Orthopedics;  Laterality: Right;   LEFT HEART CATH AND CORONARY ANGIOGRAPHY Left 07/27/2019   Procedure: LEFT HEART CATH AND CORONARY ANGIOGRAPHY;  Surgeon: Cage Deatrice DELENA, MD;  Location: [facility];  Service: Cardiovascular;  Laterality: Left;   MASTECTOMY  1986   nipple sparing mastectomy/Bilateral with silicone  breast implants, s/p saline replacements   Multiple orthopedic procedures     NOSE SURGERY     PACEMAKER IMPLANT N/A 04/13/2023   Procedure: PACEMAKER IMPLANT;  Surgeon: Fernande Elspeth BROCKS, MD;  Location: [facility];  Service: Cardiovascular;  Laterality: N/A;   TEE WITHOUT CARDIOVERSION N/A 09/26/2017   Procedure: TRANSESOPHAGEAL ECHOCARDIOGRAM (TEE);  Surgeon: Okey Vina GAILS, MD;  Location: [facility];  Service: Cardiovascular;  Laterality: N/A;   TOTAL KNEE ARTHROPLASTY Left    Social History   Socioeconomic History   Marital status: Married    Spouse name: Not on file   Number of children: 1   Years of education: Not on file   Highest education level: Not on file   Occupational History    Employer: RETIRED  Tobacco Use   Smoking status: Never   Smokeless tobacco: Never  Vaping Use   Vaping status: Never Used  Substance and Sexual Activity   Alcohol  use: No   Drug use: No   Sexual activity: Never    Birth control/protection: Surgical  Other Topics Concern   Not on file  Social History Narrative   Married    Social Drivers of Health   Tobacco Use: Low Risk (01/07/2025)   Patient History    Smoking Tobacco Use: Never    Smokeless Tobacco Use: Never    Passive Exposure: Not on file  Financial Resource Strain: Medium Risk (12/05/2024)   Received from  [facility]   Overall Financial Resource Strain (CARDIA)    Difficulty of Paying Living Expenses: Somewhat hard  Food Insecurity: No Food Insecurity (01/08/2025)   Epic    Worried About Programme Researcher, Broadcasting/film/video in the Last Year: Never true    Ran Out of Food in the Last Year: Never true  Recent Concern: Food Insecurity - Food Insecurity Present (12/05/2024)   Received from [facility]   Epic    Within the past 12 months, you worried that your food would run out before you got the money to buy more.: Sometimes true    Within the past 12 months, the food you bought just didn't last and you didn't have money to get more.: Never true  Transportation Needs: No Transportation Needs (01/08/2025)   Epic    Lack of Transportation (Medical): No    Lack of Transportation (Non-Medical): No  Physical Activity: Inactive (12/04/2024)   Exercise Vital Sign    Days of Exercise per Week: 0 days    Minutes of Exercise per Session: 0 min  Stress: No Stress Concern Present (12/04/2024)   [employer] of Occupational Health - Occupational Stress Questionnaire    Feeling of Stress: Not at all  Social Connections: Unknown (01/08/2025)   Social Connection and Isolation Panel    Frequency of Communication with Friends and Family: More than three times a week    Frequency of  Social Gatherings with Friends and Family: More than three times a week    Attends Religious Services: Never    Database Administrator or Organizations: Patient unable to answer    Attends Banker Meetings: Patient unable to answer    Marital Status: Married  Recent Concern: Social Connections - Moderately Isolated (12/04/2024)   Social Connection and Isolation Panel    Frequency of Communication with Friends and Family: Never    Frequency of Social Gatherings with Friends and Family: Once a week    Attends Religious Services: Never    Database Administrator or Organizations: Yes    Attends Banker Meetings: Never    Marital Status: Married  Depression (PHQ2-9): Medium Risk (12/04/2024)   Depression (PHQ2-9)    PHQ-2 Score: 8  Alcohol  Screen: Low Risk (12/04/2024)   Alcohol  Screen    Last Alcohol  Screening Score (AUDIT): 0  Housing: Low Risk (01/08/2025)   Epic    Unable to Pay for Housing in the Last Year: No    Number of Times Moved in the Last Year: 0    Homeless in the Last Year: No  Utilities: Not At Risk (01/08/2025)   Epic    Threatened with loss of utilities: No  Health Literacy: Inadequate Health Literacy (12/04/2024)   B1300 Health Literacy    Frequency of need for help with medical instructions: Always   Family History  Problem Relation Age of Onset   Heart disease Mother    Stomach cancer Father    Breast cancer Sister 76   Breast cancer Sister 39   Breast cancer Sister 61   Breast cancer Sister    Breast cancer Maternal Grandmother    Diabetes Brother    Esophageal cancer Brother    Kidney failure Brother    Heart disease Son        found at autopsy   Ovarian cancer Neg Hx    Allergies[1] Prior to Admission medications  Medication Sig Start Date End Date Taking? Authorizing Provider  acetaminophen  (TYLENOL ) 500 MG tablet Take 1,000 mg by mouth every 6 (six) hours as needed (pain).    Yes [provider]  amiodarone   (PACERONE ) 200 MG tablet Take 200 mg by mouth daily.   Yes [provider]  Carboxymethylcellul-Glycerin (LUBRICATING EYE DROPS OP) Place 1 drop into both eyes daily as needed (dry eyes).   Yes [provider]  cholecalciferol  (VITAMIN D ) 1000 units tablet Take 1,000 Units by mouth daily.   Yes [provider]  cyanocobalamin  (VITAMIN B12) 500 MCG tablet Take 500 mcg by mouth daily.   Yes [provider]  donepezil  (ARICEPT  ODT) 10 MG disintegrating tablet Take 1 tablet by mouth at bedtime. 03/08/24 03/08/25 Yes [provider]  metoprolol  succinate (TOPROL -XL) 25 MG 24 hr tablet Take 1 tablet (25 mg total) by mouth daily. 07/04/24  Yes Furth, Cadence H, PA-C  mupirocin  ointment (BACTROBAN ) 2 % Apply 1 Application topically 2 (two) times daily. Apply to wound and cover with bandage until healed. Patient taking differently: Apply 1 Application topically 2 (two) times daily. Apply to wound and cover with bandage until healed. As needed 05/02/24  Yes Hester Alm BROCKS, MD  pantoprazole  (PROTONIX ) 40 MG tablet Take 1 tablet (40 mg total) by mouth daily. 10/24/24  Yes Marylynn Verneita CROME, MD  potassium chloride  SA (KLOR-CON  M) 20 MEQ tablet Take 1 tablet (20 mEq total) by mouth daily. Take with torsemide  06/21/24  Yes Riddle, Suzann, NP  pravastatin  (PRAVACHOL ) 20 MG tablet TAKE 1 TABLET BY MOUTH DAILY 08/01/24  Yes Darron Deatrice LABOR, MD  sodium chloride  (OCEAN) 0.65 % SOLN nasal spray Place 1 spray into both nostrils daily as needed for congestion.   Yes [provider]  torsemide  (DEMADEX ) 20 MG tablet Take 1 tablet (20 mg total) by mouth daily. 10/04/24  Yes Darron Deatrice LABOR, MD  amoxicillin  (AMOXIL ) 500 MG capsule Take 500 mg by mouth every 6 (six) hours. Patient not taking: Reported on 01/07/2025 12/27/24   [provider]   CT HEAD WO CONTRAST ( ) Result Date: 01/07/2025 EXAM: CT HEAD WITHOUT CONTRAST 01/07/2025 02:12:47 PM TECHNIQUE: CT of the  head was performed without the administration of intravenous contrast. Automated exposure control, iterative reconstruction, and/or weight based adjustment of the mA/kV was utilized to reduce the radiation dose to as low as reasonably achievable. COMPARISON: Head CT 11/26/2024. CLINICAL HISTORY: fall 1 wk ago, worsening AMS FINDINGS: BRAIN AND VENTRICLES: There is no evidence of an acute infarct, intracranial hemorrhage, mass, midline shift, hydrocephalus, or extra-axial fluid collection. There is mild cerebral atrophy. Cerebral white matter hypodensities are unchanged and nonspecific but compatible with mild chronic small vessel ischemic disease. ORBITS: Bilateral cataract extraction. SINUSES: Small mucous retention cyst in the right maxillary sinus. Small right and trace left mastoid effusions. SOFT TISSUES AND SKULL: No acute soft tissue abnormality. No skull fracture. IMPRESSION: 1. No acute intracranial abnormality. 2. Mild chronic small vessel ischemic disease. Electronically signed by: Dasie Hamburg MD 01/07/2025 02:19 PM EST RP Workstation: [id number]   DG Chest 2 View Result Date: 01/07/2025 CLINICAL DATA:  Chest pain.  Hypertension.  Atrial fibrillation. EXAM: CHEST - 2 VIEW COMPARISON:  11/26/2024 FINDINGS: Unchanged mild cardiomegaly. Interval development of mild pulmonary vascular congestion. Mild bibasilar opacities likely due to atelectasis. Single lead LEFT chest wall pacemaker is unchanged in configuration. IMPRESSION: Mild cardiomegaly with interval worsening of pulmonary vascular congestion suspicious for CHF/fluid volume overload. Electronically Signed   By: Aliene Lloyd M.D.   On: 01/07/2025  12:34   Positive ROS: All other systems have been reviewed and were otherwise negative with the exception of those mentioned in the HPI and as above.  Physical Exam: General:  Alert, no acute distress Psychiatric:  Patient is not competent for consent   Cardiovascular:  No pedal edema Respiratory:   No wheezing, non-labored breathing GI:  Abdomen is soft and non-tender Skin:  No lesions in the area of chief complaint Neurologic:  Sensation intact distally Lymphatic:  No axillary or cervical lymphadenopathy  Orthopedic Exam:  Orthopedic examination is limited to the left shoulder and upper extremity.  There is perhaps minimal swelling around the shoulder, as well as some resolving ecchymosis in the anterior aspect of the upper arm, but no erythema, abrasions, or other skin abnormalities are identified.  She has mild tenderness to palpation over the anterolateral aspect of the shoulder.  She has more moderate discomfort with passive range of motion of the shoulder.  She is grossly neurovascularly intact to the left upper extremity and hand, demonstrating intact sensation to the axillary nerve and musculocutaneous nerve distributions.  X-rays:  Recent x-rays of the left humerus are available for review and have been reviewed by myself.  These films demonstrate an essentially nondisplaced anatomic neck fracture of the left proximal humerus.  The humeral head appears to be appropriately located within the glenoid.  The joint itself appears to be without evidence for significant degenerative changes.  There is evidence of diffuse osteopenia, but no other acute bony processes are identified.  Assessment: Essentially nondisplaced left proximal humerus fracture.  Plan: Treatment options have been discussed with the patient and her husband who is at the bedside.  The patient's fracture is sufficiently well aligned that I feel that this can be managed non-surgically, especially given the patient's multiple other medical issues.  She is to remain in her shoulder sling at all times, removing it only for bathing purposes.  According to her husband, she has a follow-up appointment at [facility] later this week, assuming she has been discharged home by then.  I will order repeat x-rays of her shoulder  today.  Assuming they look good, we may be able to postpone her follow-up appointment at [facility] for another few weeks.  Thank you for asking me to participate in the care of this most pleasant young unfortunate woman.  I will be happy to follow her with you.   DOROTHA Reyes Maltos, MD  Beeper #:  [phone]  01/08/2025 9:57 AM     [1]  Allergies Allergen Reactions   Cyclobenzaprine Other (See Comments)    Dropped blood pressure and heart rate, talking out of my mind   Eliquis  [Apixaban ] Other (See Comments)    RECURRENT FALLS,  WITH HEAD INJURIES (5 IN 5 MONTHS)   Iodine  Anaphylaxis    NO PROBLEMS WITH BETADINE    Biaxin [Clarithromycin] Nausea And Vomiting   Codeine Nausea And Vomiting   Digoxin  And Related Other (See Comments)    Fatigue, eye puffiness, hoarsness   Diltiazem  Other (See Comments)   Enalapril Other (See Comments)    unknown   Famotidine      Caused dizziness   Fluocinonide Other (See Comments)    Tingling sensation in head and redness to scalp.   Iodinated Contrast Media Other (See Comments)    Tachycardia   Iodine -131    Losartan  Potassium Other (See Comments)    Dizzy and headache   Omnicef [Cefdinir]    Oxycodone Nausea And Vomiting  Promethazine  Other (See Comments)    Unknown    Sertraline      Dizziness and weakness   Tizanidine Other (See Comments)    Other Reaction(s): Other (See Comments)    hypotension   Tizanidine Hcl Other (See Comments)    hypotension    Warfarin And Related     Bleeding    Xarelto  [Rivaroxaban ] Other (See Comments)    bleeding   "

## 2025-01-08 NOTE — Progress Notes (Signed)
 I reached out to the patient's son-left a voicemail re: setting up a family meeting between 12-1 tomorrow to discuss patient's recent imaging/plan of care. GB

## 2025-01-09 DIAGNOSIS — C787 Secondary malignant neoplasm of liver and intrahepatic bile duct: Secondary | ICD-10-CM | POA: Diagnosis not present

## 2025-01-09 DIAGNOSIS — N390 Urinary tract infection, site not specified: Secondary | ICD-10-CM | POA: Diagnosis not present

## 2025-01-09 DIAGNOSIS — C779 Secondary and unspecified malignant neoplasm of lymph node, unspecified: Secondary | ICD-10-CM

## 2025-01-09 DIAGNOSIS — C4A9 Merkel cell carcinoma, unspecified: Secondary | ICD-10-CM | POA: Diagnosis not present

## 2025-01-09 DIAGNOSIS — C4A4 Merkel cell carcinoma of scalp and neck: Secondary | ICD-10-CM | POA: Diagnosis not present

## 2025-01-09 DIAGNOSIS — K219 Gastro-esophageal reflux disease without esophagitis: Secondary | ICD-10-CM

## 2025-01-09 DIAGNOSIS — F03B4 Unspecified dementia, moderate, with anxiety: Secondary | ICD-10-CM

## 2025-01-09 DIAGNOSIS — N179 Acute kidney failure, unspecified: Secondary | ICD-10-CM

## 2025-01-09 DIAGNOSIS — C7972 Secondary malignant neoplasm of left adrenal gland: Secondary | ICD-10-CM

## 2025-01-09 DIAGNOSIS — Z950 Presence of cardiac pacemaker: Secondary | ICD-10-CM

## 2025-01-09 DIAGNOSIS — I509 Heart failure, unspecified: Secondary | ICD-10-CM | POA: Diagnosis not present

## 2025-01-09 DIAGNOSIS — Z515 Encounter for palliative care: Secondary | ICD-10-CM

## 2025-01-09 DIAGNOSIS — G9341 Metabolic encephalopathy: Secondary | ICD-10-CM | POA: Diagnosis not present

## 2025-01-09 DIAGNOSIS — I4819 Other persistent atrial fibrillation: Secondary | ICD-10-CM

## 2025-01-09 DIAGNOSIS — I10 Essential (primary) hypertension: Secondary | ICD-10-CM

## 2025-01-09 DIAGNOSIS — C7951 Secondary malignant neoplasm of bone: Secondary | ICD-10-CM | POA: Diagnosis not present

## 2025-01-09 DIAGNOSIS — E43 Unspecified severe protein-calorie malnutrition: Secondary | ICD-10-CM | POA: Insufficient documentation

## 2025-01-09 DIAGNOSIS — I429 Cardiomyopathy, unspecified: Secondary | ICD-10-CM

## 2025-01-09 LAB — PROTEIN ELECTROPHORESIS, SERUM
A/G Ratio: 1 (ref 0.7–1.7)
Albumin ELP: 2.8 g/dL — ABNORMAL LOW (ref 2.9–4.4)
Alpha-1-Globulin: 0.4 g/dL (ref 0.0–0.4)
Alpha-2-Globulin: 0.8 g/dL (ref 0.4–1.0)
Beta Globulin: 0.9 g/dL (ref 0.7–1.3)
Gamma Globulin: 0.8 g/dL (ref 0.4–1.8)
Globulin, Total: 2.9 g/dL (ref 2.2–3.9)
Total Protein ELP: 5.7 g/dL — ABNORMAL LOW (ref 6.0–8.5)

## 2025-01-09 LAB — GLUCOSE, CAPILLARY
Glucose-Capillary: 126 mg/dL — ABNORMAL HIGH (ref 70–99)
Glucose-Capillary: 85 mg/dL (ref 70–99)
Glucose-Capillary: 123 mg/dL — ABNORMAL HIGH (ref 70–99)
Glucose-Capillary: 101 mg/dL — ABNORMAL HIGH (ref 70–99)

## 2025-01-09 LAB — BASIC METABOLIC PANEL WITH GFR
Anion gap: 13 (ref 5–15)
BUN: 62 mg/dL — ABNORMAL HIGH (ref 8–23)
CO2: 24 mmol/L (ref 22–32)
Calcium: 10.4 mg/dL — ABNORMAL HIGH (ref 8.9–10.3)
Chloride: 104 mmol/L (ref 98–111)
Creatinine, Ser: 2.49 mg/dL — ABNORMAL HIGH (ref 0.44–1.00)
GFR, Estimated: 18 mL/min — ABNORMAL LOW
Glucose, Bld: 126 mg/dL — ABNORMAL HIGH (ref 70–99)
Potassium: 4.6 mmol/L (ref 3.5–5.1)
Sodium: 142 mmol/L (ref 135–145)

## 2025-01-09 LAB — RETICULOCYTES
Immature Retic Fract: 33.8 % — ABNORMAL HIGH (ref 2.3–15.9)
RBC.: 3.24 MIL/uL — ABNORMAL LOW (ref 3.87–5.11)
Retic Count, Absolute: 80 K/uL (ref 19.0–186.0)
Retic Ct Pct: 2.5 % (ref 0.4–3.1)

## 2025-01-09 LAB — VITAMIN B12: Vitamin B-12: 2946 pg/mL — ABNORMAL HIGH (ref 180–914)

## 2025-01-09 LAB — IRON AND TIBC
Iron: 41 ug/dL (ref 28–170)
Saturation Ratios: 13 % (ref 10.4–31.8)
TIBC: 309 ug/dL (ref 250–450)
UIBC: 269 ug/dL

## 2025-01-09 LAB — FOLATE: Folate: 10.5 ng/mL

## 2025-01-09 LAB — FERRITIN: Ferritin: 196 ng/mL (ref 11–307)

## 2025-01-09 MED ORDER — LACTATED RINGERS IV SOLN: INTRAVENOUS | Status: AC

## 2025-01-09 MED ORDER — PANTOPRAZOLE SODIUM 40 MG PO TBEC
40.0000 mg | DELAYED_RELEASE_TABLET | Freq: Every day | ORAL | Status: DC
  Administered 2025-01-09 – 2025-01-10 (×2): 40 mg via ORAL
  Filled 2025-01-09 (×2): qty 1

## 2025-01-09 MED ORDER — ENSURE PLUS HIGH PROTEIN PO LIQD
237.0000 mL | Freq: Two times a day (BID) | ORAL | Status: DC
  Administered 2025-01-09 – 2025-01-10 (×3): 237 mL via ORAL

## 2025-01-09 NOTE — Discharge Instructions (Signed)
 Food Resources  Agency Name: [facility] Address: [address], [place], [place] [address] Phone: [phone] Website: [email or web address] Service(s) Offered: Housing services, self-sufficiency, congregate meal program, weatherization program, Event organiser program, emergency food assistance,  housing counseling, home ownership program, wheels - to work program.  [employer] free for 60 and older at various locations from [employer], Monday-Friday:  [employer], [address] [place], [phone] -[facility], [name], [place] [phone]  -[facility], [address] [place] [phone]  -[address], [address], [place], [phone]  Agency Name: [employer] Address: [phone] [address], [address], [place], [place] [address] Phone: [phone] Website: [email or web address] Service(s) Offered: Home delivered hot, frozen, and emergency  meals. Grocery assistance program which matches  volunteers one-on-one with seniors unable to grocery shop  for themselves. Must be 60 years and older; less than 20  hours of in-home aide service, limited or no driving ability;  live alone or with someone with a disability; live in  [place].  Agency Name: [employer] [facility] [employer]) Address: [address], [place], [place] [address] Phone: [phone] Service(s) Offered: Food is served to shut-ins, homeless, elderly, and low income people in the community every Saturday (11:30 am-12:30 pm) and Sunday (12:30 pm-1:30pm). Volunteers also offer help and encouragement in seeking employment,  and spiritual guidance.  Agency Name: [employer] Address: [address] [place], [place] [address] Phone: [phone] Service(s) Offered: Child support services; child welfare services; food stamps; Medicaid; work first family assistance; and aid  with fuel,  rent, food and medicine.  Agency Name: [employer] Address: [address], [place], [place] Phone: [phone] Website: [email or web address] Services Offered: Monday 10:00am-12:00, 8:00pm-9:00pm, and Friday 10:00am-12:00.  Agency Name: [employer] [facility] Address: [address] [place], [place] [address] Phone: [phone] Website: [email or web address] Service(s) Offered: Serves weekday meals, open from 11:30 am- 1:00 pm., and 6:30-7:30pm, Monday-Wednesday-Friday distributes food 3:30-6pm, Monday-Wednesday-Friday.  Agency Name: [facility] Address: [address], [place], [place] Phone: [phone] Website: [email or web address] Services Offered: Distributes food the 4th Saturday of the month, starting at 8:00 am  Agency Name: [facility] Address: [phone] [place], [place] [address] Phone: [phone] Website: [email or web address] Service(s) Offered: Bread of life, weekly food pantry. Open Wednesdays from 10:00am-noon.  Agency Name: [facility] [employer] Address: [address] [place], [place], [place] Phone: [phone] Services Offered: Distributes food 9am-1pm, Monday-Thursday. Call for details.  Agency Name: [facility] Address: [address] [place], [place] [address] Phone: [phone] Website: [email or web address] Service(s) Offered: Games developer. Call for assistance.  Agency Name: [facility] Address: [address], [place], [place] [address] Phone: [phone] Service Offered: Emergency Food Pantry. Call for appointment.  Agency Name: [facility] Address: [address], [place], [place] [address] Phone: [phone] Website: [email or web address] Services Offered: Games developer. Call for details  Agency Name: [facility] Address: [address] [place], [place] Phone:  [phone] Website: [email or web address] Service(s) Offered: Emergency Food Pantry. Call for details.  Agency Name: Holiday representative Address: [address], [place], [place] [address] Phone: [phone] or [phone] Website: [email or web address] Service(s) Offered: Distribute food 9am-11:30 am, Tuesday-Friday, and 1-3:30pm, Monday-Friday. Food pantry Monday-Friday 1pm-3pm, fresh items, Mon.-Wed.-Fri.  Agency Name: [facility] Address: [address] [place], [place] [address] Phone: [phone] Website: [email or web address] Services Offered: Distribute food Tues and Sats from 9:00am-noon.  Closed 1st Saturday of each month. Call for details  Agency Name: [employer] Address: Fayrene Boatman [facility] [address], [place] [address] Phone: [phone]  Services Offered: Delivers meals every Thursday

## 2025-01-09 NOTE — Assessment & Plan Note (Signed)
 Blood pressure currently borderline soft. - Continue to monitor

## 2025-01-09 NOTE — Assessment & Plan Note (Signed)
 Estimated body mass index is 23.28 kg/m as calculated from the following:   Height as of this encounter: 5' 3 (1.6 m).   Weight as of this encounter: 59.6 kg.   -Continue with dietary supplements if she is able to take

## 2025-01-09 NOTE — Assessment & Plan Note (Signed)
-  Continue with SSI

## 2025-01-09 NOTE — Assessment & Plan Note (Signed)
 CT abdomen and pelvis with concern of extensive metastatic disease involving bone, adrenal, lymph node and liver.  Oncology met with family, patient is not a candidate for any further treatment due to very poor underlying functional status. -Now being switched to comfort care

## 2025-01-09 NOTE — Assessment & Plan Note (Signed)
"  Continue with Protonix          "

## 2025-01-09 NOTE — Progress Notes (Signed)
 [facility], Room 239  [facility] Liaison Note  Received request from ICM, Shasta Daring, for hospice services at home after discharge.  Spoke with spouse and son  to initiate education related to hospice philosophy, services, and team approach to care. Family family verbalized understanding of information given.  Per discussion, the plan is for discharge home by [facility]    DME needs discussed.  Patient has the following equipment in the home: walker an BSC.  Patient/family requests the following equipment in the home: hospital bed andover the bed table.    The address has been verified and is correct in the chart. [name] [name] and phone number [phone] is the family contact to arrange time of equipment delivery.    Please send signed and completed DNR home with the patient/family.  Please provide prescriptions at discharge as needed to ensure ongoing symptom management.   [facility] information and contact numbers given to family  Above information shared with Shasta Daring, RN.      Please call with any Hospice related questions or concerns.  Thank you for the opportunity to participate in this patient's care.  [name] [name], [facility] Liaison [phone]

## 2025-01-09 NOTE — Progress Notes (Signed)
 Initial Nutrition Assessment  DOCUMENTATION CODES:   Severe malnutrition in context of chronic illness  INTERVENTION:   -MVI with minerals daily -Ensure Plus High Protein po BID, each supplement provides 350 kcal and 20 grams of protein  -Magic cup TID with meals, each supplement provides 290 kcal and 9 grams of protein  -Feeding assistance with meals  -Downgrade diet to dysphagia 3 for ease of intake -Case and recommendations discussed with RN, MD, oncology, and palliative care; plan for goals of care discussions with family today. RD would be hesitant to recommend alternative means of nutrition/ hydration due to advanced age and dementia. However, if TF is pursued, recommend:   Initiate Osmolite 1.2 @ 20 ml/hr and increase by 10 ml every 12 hours to goal rate of 60 ml/hr.   30 ml free water flush every 4 hours  Tube feeding regimen provides 1728 kcal (100% of needs), 80 grams of protein, and 1181 ml of H2O. Total free water: 1361 ml daily   -100 mg thiamine  daily x 7 days -Monitor Mg, K, and Phos daily and replete as needed secondary to high refeeding risk NUTRITION DIAGNOSIS:   Severe Malnutrition related to chronic illness (CHF, dementia) as evidenced by moderate fat depletion, severe fat depletion, moderate muscle depletion, severe muscle depletion.  GOAL:   Patient will meet greater than or equal to 90% of their needs  MONITOR:   PO intake, Supplement acceptance  REASON FOR ASSESSMENT:   Consult Assessment of nutrition requirement/status  ASSESSMENT:   89 y.o. year old female with medical history of hypertension, hyperlipidemia, type 2 diabetes, CHF (normal EF and 06/24), atrial fibrillation, status post pacemaker, MDD, GAD presenting with worsening somnolence.  Patient admitted with CHF, UTI, and SKI.   Reviewed I/O's: +225 ml x 24 hours and -176 since admission  UOP: 250 ml x 24 hours  Per orthopedics notes, patient with nondisplaced left proximal humerus  fracture.; plan for non-operative management.   Per MD, patient with markel cell carcinoma with extensive mets, hypercalcemia, advanced dementia and fall with left humeral fracture. Plan for goals of care discussions. Discussed with MD, RN, TOC, oncology, and palliative care; plan for goals of care meeting with family today.   Patient sitting up in bed at time of visit. No family present. She is very lethargic at time of visit and did not answer any questions. Breakfast tray in front of patient, unattempted. Per RN, she has been lethargic this shift is about to attempt to give medications.   Reviewed weight history; patient weight has have ranged from 54.4-63.7 kg over the past 2 months. Patient with moderate edema on exam and with history of CHF, making weights difficult to interpret. Suspect edema may be masking further weight loss as well as fat and muscle depletions.   Patient meets criteria for severe malnutrition. Given malnutrition, poor oral intake, and advanced age, diet restrictions are not indicated at this time. RD will downgrade to a dysphagia 3 diet for ease of intake. RD will also add supplements to help optimize oral intake, however, suspect patient will not meet nutritional needs via PO route. Case and recommendations discussed with RN, MD, oncology, and palliative care; would be hesitant to recommend alternative means of nutrition/ hydration due to advanced age and dementia.    Medications reviewed and include lactated ringers  @ 100 ml/hr.   Labs reviewed: CBGS: 85-135 (inpatient orders for glycemic control are 0-5 units insulin  aspart daily at bedtime and 0-9 units insulin  aspart TID with  meals).    NUTRITION - FOCUSED PHYSICAL EXAM:  Flowsheet Row Most Recent Value  Orbital Region Moderate depletion  Upper Arm Region Severe depletion  Thoracic and Lumbar Region Severe depletion  Buccal Region Severe depletion  Temple Region Moderate depletion  Clavicle Bone Region Severe  depletion  Clavicle and Acromion Bone Region Severe depletion  Scapular Bone Region Severe depletion  Dorsal Hand Severe depletion  Patellar Region Mild depletion  Anterior Thigh Region Mild depletion  Posterior Calf Region Mild depletion  Edema (RD Assessment) Moderate  Hair Reviewed  Eyes Reviewed  Mouth Reviewed  Skin Reviewed  Nails Reviewed    Diet Order:   Diet Order             DIET DYS 3 Fluid consistency: Thin  Diet effective now                   EDUCATION NEEDS:   Not appropriate for education at this time  Skin:  Skin Assessment: Reviewed RN Assessment  Last BM:  01/09/25 (type 5)  Height:   Ht Readings from Last 1 Encounters:  01/07/25 5' 3 (1.6 m)    Weight:   Wt Readings from Last 1 Encounters:  01/09/25 59.6 kg    Ideal Body Weight:  52.3 kg  BMI:  Body mass index is 23.28 kg/m.  Estimated Nutritional Needs:   Kcal:  1550-1750  Protein:  75-90 grams  Fluid:  1.5-1.7 L    Margery ORN, RD, LDN, CDCES Registered Dietitian III Certified Diabetes Care and Education Specialist If unable to reach this RD, please use RD Inpatient group chat on secure chat between hours of 8am-4 pm daily

## 2025-01-09 NOTE — Assessment & Plan Note (Signed)
 No acute concern. - Outpatient follow-up with cardiology

## 2025-01-09 NOTE — TOC Initial Note (Addendum)
 Transition of Care [facility]) - Initial/Assessment Note    Patient Details  Name: [name] [name] MRN: [id number] Date of Birth: [birth year]  [facility]) CM/SW Contact:    Shasta DELENA Daring, RN Phone Number: 01/09/2025, 2:18 PM  Clinical Narrative:                 Per MD, patient family requested hospice evaluation. Specifically requested [facility].  Hospice liaison is aware.   Determination of home hospice for hospice facility pending.   3:21 PM Notified by hospice liaison that family wants patient to discharge home. Hospice has ordered DME. TOC will arrange transportation home when discharge orders are entered.    Expected Discharge Plan: Home w Hospice Care (vs hospice facility) Barriers to Discharge: Continued Medical Work up   Patient Goals and CMS Choice            Expected Discharge Plan and Services                                              Prior Living Arrangements/Services     Patient language and need for interpreter reviewed:: Yes        Need for Family Participation in Patient Care: Yes (Comment) Care giver support system in place?: Yes (comment)   Criminal Activity/Legal Involvement Pertinent to Current Situation/Hospitalization: No - Comment as needed  Activities of Daily Living      Permission Sought/Granted                  Emotional Assessment              Admission diagnosis:  Acute exacerbation of CHF (congestive heart failure) (HCC) [I50.9] Altered mental status, unspecified altered mental status type [R41.82] Acute on chronic congestive heart failure, unspecified heart failure type [facility]) [I50.9] Patient Active Problem List   Diagnosis Date Noted   Palliative care encounter 01/09/2025   Acute metabolic encephalopathy 01/08/2025   Fall 01/08/2025   Left humeral fracture 01/08/2025   Hypercalcemia 01/08/2025   Acute exacerbation of CHF (congestive heart failure) (HCC) 01/07/2025   H/O bilateral breast  implants 10/25/2024   Neuroendocrine carcinoma of unknown origin (HCC) 05/14/2024   IBS (irritable bowel syndrome) 03/22/2024   History of recent fall 03/01/2024   Type 2 diabetes mellitus with neurological complications (HCC) 03/01/2024   Mass of breast 08/03/2023   Neck pain 06/13/2023   Dementia (HCC) 06/07/2023   Congestive heart failure with cardiomyopathy (HCC) 06/07/2023   Cardiac pacemaker in situ 04/23/2023   Tachycardia-bradycardia (HCC) 04/23/2023   GERD without esophagitis 04/08/2023   Sinus pause 04/08/2023   Syncope 04/07/2023   Coronary atherosclerosis due to calcified coronary lesion of native artery 03/05/2023   Statin myopathy 05/27/2022   Thrombophilia 09/10/2021   Insomnia 06/03/2021   Aortic atherosclerosis 02/04/2021   Porokeratosis 02/21/2020   Plantar flexed metatarsal bone of left foot 02/21/2020   Type II diabetes mellitus (HCC) 12/31/2019   Chronic venous insufficiency 09/01/2016   Preoperative clearance 06/29/2016   Visit for preventive health examination 12/15/2015   Cystocele 11/18/2015   Inguinal hernia 10/13/2015   Pulmonary hypertension, moderate to severe (HCC) 04/24/2015   Arthritis of knee, degenerative 02/21/2015   AKI (acute kidney injury) 01/14/2015   S/P TAH-BSO 12/13/2014   S/P bilateral mastectomy 12/13/2014   Long term current use of anticoagulant therapy 09/25/2014  HH (hiatus hernia) 04/20/2014   Tachycardia induced cardiomyopathy (HCC) 07/20/2013   Vitamin D  deficiency 04/24/2013   Medicare annual wellness visit, subsequent 04/23/2013   Obesity 06/30/2012   History of [facility] spotted fever 06/22/2012   Persistent atrial fibrillation (HCC) 05/09/2012   Anxiety and depression 05/09/2012   Hyperlipidemia 07/08/2010   Essential hypertension 03/05/2009   PCP:  Marylynn Verneita CROME, MD Pharmacy:   [facility] - [place], [place] - [address] [place] [address] Phone: [phone] Fax:  [phone]     Social Drivers of Health (SDOH) Social History: SDOH Screenings   Food Insecurity: No Food Insecurity (01/08/2025)  Recent Concern: Food Insecurity - Food Insecurity Present (12/05/2024)   Received from [facility]  Housing: Low Risk (01/08/2025)  Transportation Needs: No Transportation Needs (01/08/2025)  Utilities: Not At Risk (01/08/2025)  Alcohol  Screen: Low Risk (12/04/2024)  Depression (PHQ2-9): Medium Risk (12/04/2024)  Financial Resource Strain: Medium Risk (12/05/2024)   [facility] from [facility]  Physical Activity: Inactive (12/04/2024)  Social Connections: Unknown (01/08/2025)  Recent Concern: Social Connections - Moderately Isolated (12/04/2024)  Stress: No Stress Concern Present (12/04/2024)  Tobacco Use: Low Risk (01/07/2025)  Health Literacy: Inadequate Health Literacy (12/04/2024)   SDOH Interventions: Food Insecurity Interventions: Inpatient TOC, Other (Comment) (Resources added to AVS)   Readmission Risk Interventions     No data to display

## 2025-01-09 NOTE — Assessment & Plan Note (Signed)
 Currently amiodarone  and metoprolol  is being held due to borderline bradycardia.

## 2025-01-09 NOTE — Assessment & Plan Note (Signed)
 Concern of acute on chronic HFpEF on admission.  She was given IV Lasix  but due to worsening renal function it was stopped. - Monitor volume status -Can resume home torsemide  on discharge

## 2025-01-09 NOTE — Plan of Care (Signed)

## 2025-01-09 NOTE — Assessment & Plan Note (Signed)
 History of recent fall resulted in left femoral fracture which was evaluated by orthopedic surgery and they were recommending conservative management with sling and pain control. - Continue with sling and supportive care

## 2025-01-09 NOTE — Assessment & Plan Note (Signed)
 Urine cultures grew Klebsiella pneumonia, shows resistance only to ampicillin. -Continuing with ceftriaxone 

## 2025-01-09 NOTE — Assessment & Plan Note (Signed)
 Worsening renal function despite getting some IV fluid. - Patient is now comfort care

## 2025-01-09 NOTE — Consult Note (Signed)
 "    Palliative Medicine [facility] at [facility] [phone] [phone]   Name: [name] [name] Date: 01/09/2025 MRN: [id number]  DOB: 1936/08/02  Patient Care Team: Marylynn Verneita CROME, MD as PCP - General (Internal Medicine) Darron Deatrice DELENA, MD as PCP - Cardiology (Cardiology) Kennyth Chew, MD as PCP - Electrophysiology (Clinical Cardiac Electrophysiology) Darron Deatrice DELENA, MD as Consulting Physician (Cardiology) Ermalinda Lenn HERO, LCSW as VBCI Care Management Devra Lands, RN as VBCI Care Management Riddle, Suzann, NP as Nurse Practitioner (Clinical Cardiac Electrophysiology)    REASON FOR CONSULTATION: [name] [name] is a 89 y.o. female with multiple medical problems including dementia, cardiomyopathy, CKD stage III, and history of Merkel cell carcinoma status post XRT.  Patient with recent fall and left proximal humerus fracture.  She is now admitted 01/07/2025 with altered mental status found to have hypercalcemia and CT of the abdomen and pelvis revealed widespread metastatic disease involving the liver, lymph nodes, left adrenal, and bone.  Palliative care consulted to address goals.  SOCIAL HISTORY:     reports that she has never smoked. She has never used smokeless tobacco. She reports that she does not drink alcohol  and does not use drugs.  Patient is married and lives at home with her husband.  There are 2 stepsons and a stepdaughter.  Patient had a biological son who is now deceased.  ADVANCE DIRECTIVES:  Not on file  CODE STATUS: DNR  PAST MEDICAL HISTORY: Past Medical History:  Diagnosis Date   Actinic keratosis    Arthritis    Atrial flutter (HCC) 02/2011   s/p cardioversion    Chest pain    a. H/o cardiac cath x 2-> 2012 -->nl cors;  b. 12/2016 MV: EF 61%, small region of mild perfusion defect in the apical anteroseptal region c/w breast attenuation, no ischemia-->Low risk; c. 06/2019 MV: Mod size, mild inflat  ischemia, EF 59%. Cor and Ao Ca2+. Inflat defect more pronounced on this study compared to last; c. 07/2019 Cath: LM nl, LAD min irregs, LCX nl, OM1/2/3 nl, RCA min irregs.   CKD (chronic kidney disease), stage III (HCC)    Concussion with no loss of consciousness 09/01/2016   Cystocele    Degenerative disorder of bone    Dementia (HCC)    GERD (gastroesophageal reflux disease)    Headache(784.0)    chronic   Hiatal hernia    Hyperlipidemia    Hypertension    Influenza 01/10/2017   Knee fracture    Migraines    Mobitz type 2 second degree atrioventricular block    a. felt to be 2/2 amiodarone , resolved with decreased amiodarone  dose.  Amio since d/c'd.   Permanent atrial fibrillation (HCC)    a. status post multiple DCCVs; b. 2018 - eval for PVI but opted for rate control.   PONV (postoperative nausea and vomiting)    oxycodone and codiene cause N/V    Pre-syncope    a. In setting of dehydration and AKI in the past.   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 Conway, plan from Radiation Oncologist is to start Radiation therapy. PET scan 05/31/24 No focal, avid cutaneous lesions are visualized. No evidence of FDG avid metastatic disease. The primary lesion may not be FDG avid.   Squamous cell carcinoma of skin 11/10/2020   left distal posterior deltoid (EDC 01/15/2021)   Tachycardia  induced cardiomyopathy (HCC)    a. Resolved;  b. 08/2017 Echo: EF 50-55%, no rwma, mild MR, mildly to mod dil LA/RA; c. 02/2018 Echo: EF 55-60%, mild MR. Mildly dil LA. Nl RVSP. PASP .   Venous insufficiency    Vertigo     PAST SURGICAL HISTORY:  Past Surgical History:  Procedure Laterality Date   ABDOMINAL HYSTERECTOMY  1990   APPENDECTOMY     AUGMENTATION MAMMAPLASTY Bilateral 1986   implants   AUGMENTATION MAMMAPLASTY  1990   AUGMENTATION MAMMAPLASTY  2011   CARDIAC CATHETERIZATION      CARDIOVERSION     x 3   CARDIOVERSION     CARDIOVERSION N/A 02/07/2017   Procedure: CARDIOVERSION;  Surgeon: Deatrice DELENA Cage, MD;  Location: [facility] ORS;  Service: Cardiovascular;  Laterality: N/A;   CARDIOVERSION N/A 07/22/2017   Procedure: Cardioversion;  Surgeon: Perla Evalene PARAS, MD;  Location: [facility] ORS;  Service: Cardiovascular;  Laterality: N/A;   CATARACT EXTRACTION W/PHACO Right 09/21/2016   Procedure: CATARACT EXTRACTION PHACO AND INTRAOCULAR LENS PLACEMENT (IOC);  Surgeon: Elsie Carmine, MD;  Location: [facility] ORS;  Service: Ophthalmology;  Laterality: Right;  US  44.1 AP% 16.5 CDE 7.30 Fluid Pack Lot #[id number]   CATARACT EXTRACTION W/PHACO Left 10/19/2016   Procedure: CATARACT EXTRACTION PHACO AND INTRAOCULAR LENS PLACEMENT (IOC);  Surgeon: Elsie Carmine, MD;  Location: [facility] ORS;  Service: Ophthalmology;  Laterality: Left;  US  53.7 AP% 19.5 CDE 10.45 Fluid pack lot # [id number]   CHOLECYSTECTOMY     COMBINED AUGMENTATION MAMMAPLASTY AND ABDOMINOPLASTY     JOINT REPLACEMENT Left 06/04/2013   left knee   KNEE ARTHROSCOPY Right 08/16/2016   Procedure: ARTHROSCOPY KNEE, tear posterior horn medial meniscus, tear anterior and posterior horns of lateral meniscus, chondromalacia of lateral compartment grade 3 patella and grade 4 medial;  Surgeon: Lynwood SHAUNNA Hue, MD;  Location: [facility] ORS;  Service: Orthopedics;  Laterality: Right;   LEFT HEART CATH AND CORONARY ANGIOGRAPHY Left 07/27/2019   Procedure: LEFT HEART CATH AND CORONARY ANGIOGRAPHY;  Surgeon: Cage Deatrice DELENA, MD;  Location: [facility] IN[facility]AS[facility];  Service: Cardiovascular;  Laterality: Left;   MASTECTOMY  1986   nipple sparing mastectomy/Bilateral with silicone  breast implants, s/p saline replacements   Multiple orthopedic procedures     NOSE SURGERY     PACEMAKER IMPLANT N/A 04/13/2023   Procedure: PACEMAKER IMPLANT;  Surgeon: Fernande Elspeth BROCKS, MD;  Location: [facility];  Service: Cardiovascular;  Laterality: N/A;   TEE  WITHOUT CARDIOVERSION N/A 09/26/2017   Procedure: TRANSESOPHAGEAL ECHOCARDIOGRAM (TEE);  Surgeon: Okey Vina GAILS, MD;  Location: [facility];  Service: Cardiovascular;  Laterality: N/A;   TOTAL KNEE ARTHROPLASTY Left     HEMATOLOGY/ONCOLOGY HISTORY:  Oncology History   No problem history exists.    ALLERGIES:  is allergic to cyclobenzaprine, eliquis  [apixaban ], iodine , biaxin [clarithromycin], codeine, digoxin  and related, diltiazem , enalapril, famotidine , fluocinonide, iodinated contrast media, iodine -131, losartan  potassium, omnicef [cefdinir], oxycodone, promethazine , sertraline , tizanidine, tizanidine hcl, warfarin and related, and xarelto  [rivaroxaban ].  MEDICATIONS:  Current Facility-Administered Medications  Medication Dose Route Frequency Provider Last Rate Last Admin   acetaminophen  (TYLENOL ) tablet 650 mg  650 mg Oral Q6H PRN Khan, Ghalib, MD   650 mg at 01/09/25 9096   Or   acetaminophen  (TYLENOL ) suppository 650 mg  650 mg Rectal Q6H PRN Fernand Prost, MD       cefTRIAXone  (ROCEPHIN ) 1 g in sodium chloride  0.9 % 100 mL IVPB  1 g Intravenous Q24H Khan, Ghalib,  MD 200 mL/hr at 01/09/25 0051 1 g at 01/09/25 0051   feeding supplement (ENSURE PLUS HIGH PROTEIN) liquid 237 mL  237 mL Oral BID BM Amin, Sumayya, MD   237 mL at 01/09/25 1302   heparin  injection 5,000 Units  5,000 Units Subcutaneous Q8H Khan, Ghalib, MD   5,000 Units at 01/09/25 1302   insulin  aspart (novoLOG ) injection 0-5 Units  0-5 Units Subcutaneous QHS Fernand Prost, MD       insulin  aspart (novoLOG ) injection 0-9 Units  0-9 Units Subcutaneous TID WC Fernand Prost, MD   1 Units at 01/09/25 9096   lactated ringers  infusion   Intravenous Continuous Amin, Sumayya, MD 100 mL/hr at 01/09/25 0908 New Bag at 01/09/25 0908   OLANZapine  (ZYPREXA ) injection 2.5 mg  2.5 mg Intramuscular PRN Khan, Ghalib, MD   2.5 mg at 01/07/25 1911   ondansetron  (ZOFRAN ) tablet 4 mg  4 mg Oral Q6H PRN Fernand Prost, MD       Or   ondansetron   (ZOFRAN ) injection 4 mg  4 mg Intravenous Q6H PRN Khan, Ghalib, MD       senna-docusate (Senokot-S) tablet 1 tablet  1 tablet Oral QHS PRN Fernand Prost, MD       sodium chloride  flush (NS) 0.9 % injection 3 mL  3 mL Intravenous Q12H Fernand Prost, MD   3 mL at 01/09/25 0903    VITAL SIGNS: BP (!) 110/44 (BP Location: Right Arm)   Pulse 62   Temp 98.2 F (36.8 C) (Oral)   Resp 17   Ht 5' 3 (1.6 m)   Wt 131 lb 6.3 oz (59.6 kg)   SpO2 92%   BMI 23.28 kg/m  Filed Weights   01/07/25 1145 01/09/25 0500  Weight: 139 lb (63 kg) 131 lb 6.3 oz (59.6 kg)    Estimated body mass index is 23.28 kg/m as calculated from the following:   Height as of this encounter: 5' 3 (1.6 m).   Weight as of this encounter: 131 lb 6.3 oz (59.6 kg).  LABS: CBC:    Component Value Date/Time   WBC 11.9 (H) 01/08/2025 0254   HGB 11.5 (L) 01/08/2025 0254   HGB 12.8 02/09/2024 1240   HCT 35.2 (L) 01/08/2025 0254   HCT 38.9 02/09/2024 1240   PLT 177 01/08/2025 0254   PLT 285 02/09/2024 1240   MCV 100.3 (H) 01/08/2025 0254   MCV 98 (H) 02/09/2024 1240   MCV 99 01/11/2015 1623   NEUTROABS 3.1 06/27/2024 1621   NEUTROABS 5.6 01/29/2021 0845   NEUTROABS 9.2 (H) 01/11/2015 1623   LYMPHSABS 0.6 (L) 06/27/2024 1621   LYMPHSABS 0.8 01/29/2021 0845   LYMPHSABS 0.9 (L) 01/11/2015 1623   MONOABS 0.4 06/27/2024 1621   MONOABS 0.7 01/11/2015 1623   EOSABS 0.0 06/27/2024 1621   EOSABS 0.1 01/29/2021 0845   EOSABS 0.0 01/11/2015 1623   BASOSABS 0.0 06/27/2024 1621   BASOSABS 0.0 01/29/2021 0845   BASOSABS 0.1 01/11/2015 1623   Comprehensive Metabolic Panel:    Component Value Date/Time   NA 142 01/09/2025 0433   NA 133 (L) 07/19/2024 1428   NA 136 01/11/2015 1623   K 4.6 01/09/2025 0433   K 4.8 01/11/2015 1623   CL 104 01/09/2025 0433   CL 103 01/11/2015 1623   CO2 24 01/09/2025 0433   CO2 26 01/11/2015 1623   BUN 62 (H) 01/09/2025 0433   BUN 13 07/19/2024 1428   BUN 24 (H) 01/11/2015 1623  CREATININE 2.49 (H) 01/09/2025 0433   CREATININE 1.06 (H) 07/24/2015 1606   GLUCOSE 126 (H) 01/09/2025 0433   GLUCOSE 194 (H) 01/11/2015 1623   CALCIUM 10.4 (H) 01/09/2025 0433   CALCIUM 8.4 (L) 01/11/2015 1623   AST 32 11/26/2024 2217   AST 20 01/11/2015 1623   ALT 24 11/26/2024 2217   ALT 28 01/11/2015 1623   ALKPHOS 65 11/26/2024 2217   ALKPHOS 49 01/11/2015 1623   BILITOT 0.7 11/26/2024 2217   BILITOT 1.2 06/21/2024 1438   BILITOT 0.4 01/11/2015 1623   PROT 6.7 11/26/2024 2217   PROT 6.6 06/21/2024 1438   PROT 6.5 01/11/2015 1623   ALBUMIN 4.2 11/26/2024 2217   ALBUMIN 3.9 06/21/2024 1438   ALBUMIN 3.2 (L) 01/11/2015 1623    RADIOGRAPHIC STUDIES: CT ABDOMEN PELVIS WO CONTRAST Result Date: 01/08/2025 CLINICAL DATA:  Abdominal pain, acute, nonlocalized Per previous chest CT, history of breast cancer and neuroendocrine carcinoma of unknown origin. Multifocal metastatic disease. * Tracking Code: BO * EXAM: CT ABDOMEN AND PELVIS WITHOUT CONTRAST TECHNIQUE: Multidetector CT imaging of the abdomen and pelvis was performed following the standard protocol without IV contrast. RADIATION DOSE REDUCTION: This exam was performed according to the departmental dose-optimization program which includes automated exposure control, adjustment of the mA and/or kV according to patient size and/or use of iterative reconstruction technique. COMPARISON:  Abdominopelvic CT 10/17/2015.  Chest CT 01/08/2025. FINDINGS: Lower chest: Patchy dependent airspace opacities in both lung bases, similar to recent chest CT. Stable cardiomegaly and left subclavian pacemaker leads. There is aortic and coronary artery atherosclerosis. Hepatobiliary: As demonstrated on recent chest CT, there is widespread metastatic disease throughout the liver. There is a dominant mass within the left hepatic lobe measuring up to 12.9 x 9.3 cm. There are innumerable smaller lesions throughout the right lobe, including a lesion measuring 3.2 cm  on image 23/2 and a 3.0 cm lesion on image 35/2. These liver lesions are new compared with the remote abdominal CT. Status post cholecystectomy without significant biliary dilatation. Pancreas: Pancreatic atrophy. Suspected surrounding retroperitoneal adenopathy without definite focal abnormality of the pancreas on this noncontrast study. Spleen: Normal in size without focal abnormality. Adrenals/Urinary Tract: Possible new ill-defined left adrenal nodule measuring 2.4 x 1.5 cm on image 28/2. The right adrenal gland demonstrates no significant findings. No evidence of urinary tract calculus or hydronephrosis. No focal renal masses are identified. The bladder appears unremarkable for its degree of distention. Stomach/Bowel: No enteric contrast administered. The stomach appears unremarkable for its degree of distension. No evidence of bowel wall thickening, distention or surrounding inflammatory change. Diverticular changes in the distal colon. Vascular/Lymphatic: Assessment for nodal disease limited by lack of intravenous and enteric contrast. There is increased soft tissue thickening and nodularity throughout the retroperitoneum, suspicious for adenopathy. No evidence of significant pelvic adenopathy. Aortic and branch vessel atherosclerosis. Reproductive: Hysterectomy.  No evidence of adnexal mass. Other: Generalized soft tissue edema suspicious for anasarca. Small amount of ascites. No evidence of pneumoperitoneum. Musculoskeletal: Bilateral breast implants. Lytic lesion in the T12 vertebral body measuring up to 1.9 cm on image 23/2, consistent with metastatic disease. No evidence of pathologic fracture. Multilevel spondylosis. IMPRESSION: 1. Widespread metastatic disease throughout the liver, new compared with remote abdominal CT from 2016. 2. Suspected retroperitoneal adenopathy, suboptimally evaluated without intravenous and enteric contrast. 3. Possible new ill-defined left adrenal nodule, suspicious for  metastatic disease. 4. Lytic lesion in the T12 vertebral body, consistent with metastatic disease. No evidence of pathologic fracture. 5.  Patchy dependent airspace opacities in both lung bases, similar to recent chest CT. 6.  Aortic Atherosclerosis (ICD10-I70.0). Electronically Signed   By: Elsie Perone M.D.   On: 01/08/2025 15:39   CT CHEST WO CONTRAST Result Date: 01/08/2025 CLINICAL DATA:  Follow-up of pneumonia. Prior history includes breast cancer and neuroendocrine carcinoma of unknown origin. * Tracking Code: BO * EXAM: CT CHEST WITHOUT CONTRAST TECHNIQUE: Multidetector CT imaging of the chest was performed following the standard protocol without IV contrast. RADIATION DOSE REDUCTION: This exam was performed according to the departmental dose-optimization program which includes automated exposure control, adjustment of the mA and/or kV according to patient size and/or use of iterative reconstruction technique. COMPARISON:  Chest radiograph of 01/07/2025.  No comparison CT. FINDINGS: Cardiovascular: Multifactorial degradation, including mild motion, arm position, EKG wires and lead artifacts. Aortic atherosclerosis. Tortuous thoracic aorta. Moderate to marked cardiomegaly. Right ventricular pacer. Multivessel coronary artery atherosclerosis. Pulmonary artery enlargement, outflow tract 3.4 cm. Mediastinum/Nodes: No axillary adenopathy. No mediastinal or hilar adenopathy, given limitations of unenhanced CT. Retrocrural adenopathy at 9 mm on image 120/2. Lungs/Pleura: Trace right pleural fluid. Bibasilar scarring and/or subsegmental atelectasis. No evidence of pneumonia. Upper Abdomen: Innumerable bilateral hepatic masses. Incompletely imaged dominant mass replacing the majority of the lateral segment left liver lobe at 10.8 x 9.0 cm on 154/2. Normal imaged portions of the spleen, stomach, pancreas. Cholecystectomy. Musculoskeletal: Bilateral breast implants with left-sided intracapsular rupture. Multifocal  lytic lesions. Example within the right glenoid at 1.9 cm on image 25/2. Within the T12 vertebral body at 2.0 cm in sagittal image 79. IMPRESSION: 1. Multifactorial degradation, as detailed above. 2. No evidence of pneumonia.  Trace right pleural fluid. 3. Large volume metastatic disease, including to the liver, bones, and likely lower thoracic nodes. 4. Pulmonary artery enlargement suggests pulmonary arterial hypertension. 5. Cardiomegaly. Coronary artery atherosclerosis. Aortic Atherosclerosis (ICD10-I70.0). These results will be called to the ordering clinician or representative by the Radiologist Assistant, and communication documented in the PACS or [employer]. Electronically Signed   By: Rockey Kilts M.D.   On: 01/08/2025 14:16   DG Shoulder Left Port Result Date: 01/08/2025 CLINICAL DATA:  Proximal humerus fracture. EXAM: LEFT SHOULDER COMPARISON:  Humerus radiograph 01/01/2025 FINDINGS: Slightly increased impaction of proximal humeral neck fracture. No significant peripheral callus formation. Glenohumeral alignment is unchanged. Acromioclavicular alignment is normal. IMPRESSION: Slightly increased impaction of proximal humeral neck fracture. Electronically Signed   By: Andrea Gasman M.D.   On: 01/08/2025 14:12   CT HEAD WO CONTRAST ( ) Result Date: 01/07/2025 EXAM: CT HEAD WITHOUT CONTRAST 01/07/2025 02:12:47 PM TECHNIQUE: CT of the head was performed without the administration of intravenous contrast. Automated exposure control, iterative reconstruction, and/or weight based adjustment of the mA/kV was utilized to reduce the radiation dose to as low as reasonably achievable. COMPARISON: Head CT 11/26/2024. CLINICAL HISTORY: fall 1 wk ago, worsening AMS FINDINGS: BRAIN AND VENTRICLES: There is no evidence of an acute infarct, intracranial hemorrhage, mass, midline shift, hydrocephalus, or extra-axial fluid collection. There is mild cerebral atrophy. Cerebral white matter hypodensities are  unchanged and nonspecific but compatible with mild chronic small vessel ischemic disease. ORBITS: Bilateral cataract extraction. SINUSES: Small mucous retention cyst in the right maxillary sinus. Small right and trace left mastoid effusions. SOFT TISSUES AND SKULL: No acute soft tissue abnormality. No skull fracture. IMPRESSION: 1. No acute intracranial abnormality. 2. Mild chronic small vessel ischemic disease. Electronically signed by: Dasie Hamburg MD 01/07/2025 02:19 PM EST RP Workstation: [id number]  DG Chest 2 View Result Date: 01/07/2025 CLINICAL DATA:  Chest pain.  Hypertension.  Atrial fibrillation. EXAM: CHEST - 2 VIEW COMPARISON:  11/26/2024 FINDINGS: Unchanged mild cardiomegaly. Interval development of mild pulmonary vascular congestion. Mild bibasilar opacities likely due to atelectasis. Single lead LEFT chest wall pacemaker is unchanged in configuration. IMPRESSION: Mild cardiomegaly with interval worsening of pulmonary vascular congestion suspicious for CHF/fluid volume overload. Electronically Signed   By: Aliene Lloyd M.D.   On: 01/07/2025 12:34   DG Humerus Left Result Date: 01/01/2025 EXAM: 1 VIEW XRAY OF THE LEFT HUMERUS 01/01/2025 12:49:00 PM COMPARISON: None available. CLINICAL HISTORY: The patient experienced a fall. FINDINGS: BONES AND JOINTS: Cortical irregularity of the anatomic neck of the humerus, consistent with an acute fracture. Osteopenia. No malalignment. SOFT TISSUES: Unremarkable. IMPRESSION: 1. Osteopenia. Findings worrisome for a minimally displaced fracture of the anatomic neck of the humerus. Electronically signed by: Rogelia Myers MD MD 01/01/2025 01:26 PM EST RP Workstation: [id number]   DG Forearm Left Result Date: 01/01/2025 CLINICAL DATA:  Bilateral arm pain after fall EXAM: LEFT FOREARM - 2 VIEW COMPARISON:  None Available. FINDINGS: There is no evidence of fracture or other focal bone lesions. Soft tissues are unremarkable. IMPRESSION: Negative. Electronically  Signed   By: Lynwood Landy Raddle M.D.   On: 01/01/2025 11:53    PERFORMANCE STATUS (ECOG) : 3 - Symptomatic, >50% confined to bed  Review of Systems Unable to answer  Physical Exam General: Frail appearing Pulmonary: Unlabored Extremities: no edema, no joint deformities Skin: no rashes Neurological: Weakness, confused  IMPRESSION: Patient with history of Merkel cell carcinoma status post XRT.  Now admitted to the hospital with altered mental status found to have hypercalcemia and widespread metastatic disease involving the liver, lymph nodes, and bone.  I met with patient's husband and stepson following their conversation with Dr. Rennie.  At baseline, patient's performance status is quite poor.  She is mostly sedentary on the couch but does ambulate short distances with use of a walker.  Her husband has to help with ADLs including bathing and dressing.  She is mostly nonconversational but does answer questions with short sentences.  Family reports that patient has been declining over some months.  Family were informed that patient is not felt to be a good candidate for systemic cancer treatments.  As such, biopsy was not recommended.  We discussed the option of hospice involvement in detail.  Patient's husband has actually reached out to [facility] prior to this hospitalization to inquire about services.  Family are interested in hospice involvement at home.  Will consult the hospice liaison to coordinate.  Symptomatically, patient appears to be comfortable.  No distressing symptoms noted at present.  Discussed CODE STATUS.  Patient reportedly has a living will but it is not on file in EMR.  Husband and stepson agree that patient should not be resuscitated nor have her life prolonged artificially machines.  Will change CODE STATUS to reflect DNR/DNI per family's wishes.  PLAN: -Recommend best supportive care- -Home with hospice -DNR/DNI  Case and plan discussed with Dr.  Rennie  Time Total: 30 minutes  Visit consisted of counseling and education dealing with the complex and emotionally intense issues of symptom management and palliative care in the setting of serious and potentially life-threatening illness.Greater than 50%  of this time was spent counseling and coordinating care related to the above assessment and plan.  Signed by: Fonda Mower, PhD, NP-C   "

## 2025-01-09 NOTE — Progress Notes (Signed)
 " Progress Note   [name] FMW:[id number] DOB: 09-12-1936 DOA: 01/07/2025     1 DOS: the patient was seen and examined on 01/09/2025   Brief hospital course: Partly taken from prior notes.  [name] [name] is a 89 y.o. year old female with medical history of hypertension, hyperlipidemia, type 2 diabetes, CHF (normal EF and 06/24), atrial fibrillation, status post pacemaker, MDD, GAD presenting to the ED with worsening somnolence.   On presentation vital stable, labs with mild leukocytosis, mild macrocytic anemia.  AKI, hypercalcemia.  Elevated troponin with a flat curve.  UA concerning for UTI-cultures were obtained.  proBNP elevated around 10K.  CT head was negative for any acute abnormality, chest x-ray with cardiomegaly and worsening pulmonary vascular congestion.  Patient was initially started on ceftriaxone  and was admitted for further evaluation.  CT chest was later obtained for further evaluation of abnormal chest x-ray which shows no concern of pneumonia, trace right pleural fluid but did show a large volume metastatic disease including liver, bones and likely lower thoracic nodes.  Pulmonary artery enlargement suggesting pulmonary arterial hypertension.  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  Orthopedic surgery was also consulted for her recent left humeral fracture secondary to fall on 1/13 and they were recommending conservative management with sling and PT.  1/21: Vital stable, slight worsening of renal function with BUN 62, creatinine 2.49, urine cultures with Klebsiella pneumonia, only shows resistant to ampicillin.  Calcium of 10.4.  Anemia panel with no specific deficiency. Ordered parathyroid hormone and phosphorous. Giving some more IV fluid. Palliative care was also consulted.  Later family met with oncology and  palliative care, they decided to transition her to full comfort care and home with hospice.  [facility] was also consulted.  Patient likely be going home with hospice tomorrow after equipments are delivered.  Assessment and Plan: Acute metabolic encephalopathy Likely multifactorial with AKI, extensive metastatic disease and hypercalcemia. Remained very lethargic and encephalopathic. - Family now decided to proceed with comfort care.  AKI (acute kidney injury) Worsening renal function despite getting some IV fluid. - Patient is now comfort care  Hypercalcemia Likely secondary to underlying extensive metastatic disease. Protein electrophoresis and parathyroid labs are pending. - Patient is now comfort care  Merkel cell carcinoma (HCC) CT abdomen and pelvis with concern of extensive metastatic disease involving bone, adrenal, lymph node and liver.  Oncology met with family, patient is not a candidate for any further treatment due to very poor underlying functional status. -Now being switched to comfort care  UTI (urinary tract infection) Urine cultures grew Klebsiella pneumonia, shows resistance only to ampicillin. -Continuing with ceftriaxone   Persistent atrial fibrillation (HCC) Currently amiodarone  and metoprolol  is being held due to borderline bradycardia.  Congestive heart failure with cardiomyopathy (HCC) Concern of acute on chronic HFpEF on admission.  She was given IV Lasix  but due to worsening renal function it was stopped. - Monitor volume status -Can resume home torsemide  on discharge  Cardiac pacemaker in situ No acute concern. - Outpatient follow-up with cardiology  Essential hypertension Blood pressure currently borderline soft. - Continue to monitor  GERD without esophagitis - Continue with Protonix   Type II diabetes mellitus (HCC) - Continue with SSI  Left humeral fracture History of recent fall resulted in left femoral fracture which was evaluated by  orthopedic surgery and they were recommending conservative management with sling  and pain control. - Continue with sling and supportive care  Dementia [facility]) Patient with history of advanced dementia and was taking Aricept  at home which she can continue.  Protein-calorie malnutrition, severe Estimated body mass index is 23.28 kg/m as calculated from the following:   Height as of this encounter: 5' 3 (1.6 m).   Weight as of this encounter: 59.6 kg.   -Continue with dietary supplements if she is able to take      Subjective: Patient remained very lethargic and appears somnolent when seen today.  Unable to participate with any meaningful history.  Physical Exam: Vitals:   01/09/25 0500 01/09/25 0800 01/09/25 1122 01/09/25 1243  BP:  (!) 110/47 (!) 99/43 (!) 110/44  Pulse:  62 60 62  Resp:  16  17  Temp:  98.6 F (37 C) 98.1 F (36.7 C) 98.2 F (36.8 C)  TempSrc:  Axillary Axillary Oral  SpO2:  93%  92%  Weight: 59.6 kg     Height:       General.  Frail and malnourished elderly lady, in no acute distress. Pulmonary.  Lungs clear bilaterally, normal respiratory effort. CV.  Regular rate and rhythm, no JVD, rub or murmur. Abdomen.  Soft, nontender, nondistended, BS positive. CNS.  Lethargic and somnolent, hardly open eyes when called her name momentarily. Extremities.  No edema,  pulses intact and symmetrical.  Data Reviewed: Prior data reviewed  Family Communication: Discussed with husband  Disposition: Status is: Inpatient Remains inpatient appropriate because: Severity of illness  Planned Discharge Destination: Home with hospice  DVT prophylaxis.  Subcu heparin  Time spent: 50 minutes  This record has been created using Conservation officer, historic buildings. Errors have been sought and corrected,but may not always be located. Such creation errors do not reflect on the standard of care.   Author: Amaryllis Dare, MD 01/09/2025 3:51 PM  For on call review  [email or web address].  "

## 2025-01-09 NOTE — Progress Notes (Signed)
 [facility] CONSULT NOTE  Patient Care Team: Marylynn Verneita CROME, MD as PCP - General (Internal Medicine) Darron Deatrice LABOR, MD as PCP - Cardiology (Cardiology) Kennyth Chew, MD as PCP - Electrophysiology (Clinical Cardiac Electrophysiology) Darron Deatrice LABOR, MD as Consulting Physician (Cardiology) Ermalinda Lenn HERO, [place] as VBCI Care Management Devra Lands, RN as VBCI Care Management Riddle, Suzann, NP as Nurse Practitioner (Clinical Cardiac Electrophysiology)  CHIEF COMPLAINTS/PURPOSE OF CONSULTATION: liver lesions  Oncology History   No problem history exists.    HISTORY OF PRESENTING ILLNESS: Patient resting in the bed.  Accompanied by her husband and sons at the bedside.  [name] [name] 89 y.o.  female pleasant patient with multiple medical problems including history of Merkel cell cancer of the scalp, dementia hypertension hyperlipidemia type 2 diabetes CHF atrial fibrillation history of pacemaker-is currently admitted to hospital for generalized weakness and also increased somnolence.  Patient was diagnosed with Merkel cell cancer of the scalp.  Patient underwent excision however noted to have residual disease.  Patient subsequently completed approximately six weeks of radiation therapy for skull cancer at [facility] from late July through August of the previous year.  Follow-up in January indicated stable disease, and no new tests were performed. She was scheduled for a return visit in April but was seen earlier due to concerns about her condition.  Recent CT imaging demonstrated extensive progression of metastatic disease, including multiple hepatic lesions, spinal involvement, adrenal metastasis, and abdominal lymphadenopathy. She has advanced dementia, which severely limits her ability to communicate symptoms and participate in care. Her mobility is significantly impaired; she spends most of her time sitting and requires substantial assistance from her husband, who is  also in poor health. Oral intake is minimal, and she is described as weak.  Oncology has been consulted for further evaluation recommendations the patient, with stage IV metastatic cancer and advanced dementia, to review recent imaging and discuss management in the setting of rapid disease progression and functional decline.   Review of Systems  Unable to perform ROS: Dementia    MEDICAL HISTORY:  Past Medical History:  Diagnosis Date   Actinic keratosis    Arthritis    Atrial flutter (HCC) 02/2011   s/p cardioversion    Chest pain    a. H/o cardiac cath x 2-> 2012 -->nl cors;  b. 12/2016 MV: EF 61%, small region of mild perfusion defect in the apical anteroseptal region c/w breast attenuation, no ischemia-->Low risk; c. 06/2019 MV: Mod size, mild inflat ischemia, EF 59%. Cor and Ao Ca2+. Inflat defect more pronounced on this study compared to last; c. 07/2019 Cath: LM nl, LAD min irregs, LCX nl, OM1/2/3 nl, RCA min irregs.   CKD (chronic kidney disease), stage III (HCC)    Concussion with no loss of consciousness 09/01/2016   Cystocele    Degenerative disorder of bone    Dementia (HCC)    GERD (gastroesophageal reflux disease)    Headache(784.0)    chronic   Hiatal hernia    Hyperlipidemia    Hypertension    Influenza 01/10/2017   Knee fracture    Migraines    Mobitz type 2 second degree atrioventricular block    a. felt to be 2/2 amiodarone , resolved with decreased amiodarone  dose.  Amio since d/c'd.   Permanent atrial fibrillation (HCC)    a. status post multiple DCCVs; b. 2018 - eval for PVI but opted for rate control.   PONV (postoperative nausea and vomiting)    oxycodone  and codiene cause N/V    Pre-syncope    a. In setting of dehydration and AKI in the past.   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 Conway, plan from Radiation Oncologist is to start Radiation therapy.  PET scan 05/31/24 No focal, avid cutaneous lesions are visualized. No evidence of FDG avid metastatic disease. The primary lesion may not be FDG avid.   Squamous cell carcinoma of skin 11/10/2020   left distal posterior deltoid (EDC 01/15/2021)   Tachycardia induced cardiomyopathy (HCC)    a. Resolved;  b. 08/2017 Echo: EF 50-55%, no rwma, mild MR, mildly to mod dil LA/RA; c. 02/2018 Echo: EF 55-60%, mild MR. Mildly dil LA. Nl RVSP. PASP .   Venous insufficiency    Vertigo     SURGICAL HISTORY: Past Surgical History:  Procedure Laterality Date   ABDOMINAL HYSTERECTOMY  1990   APPENDECTOMY     AUGMENTATION MAMMAPLASTY Bilateral 1986   implants   AUGMENTATION MAMMAPLASTY  1990   AUGMENTATION MAMMAPLASTY  2011   CARDIAC CATHETERIZATION     CARDIOVERSION     x 3   CARDIOVERSION     CARDIOVERSION N/A 02/07/2017   Procedure: CARDIOVERSION;  Surgeon: Deatrice DELENA Cage, MD;  Location: [facility] ORS;  Service: Cardiovascular;  Laterality: N/A;   CARDIOVERSION N/A 07/22/2017   Procedure: Cardioversion;  Surgeon: Perla Evalene PARAS, MD;  Location: [facility] ORS;  Service: Cardiovascular;  Laterality: N/A;   CATARACT EXTRACTION W/PHACO Right 09/21/2016   Procedure: CATARACT EXTRACTION PHACO AND INTRAOCULAR LENS PLACEMENT (IOC);  Surgeon: Elsie Carmine, MD;  Location: [facility] ORS;  Service: Ophthalmology;  Laterality: Right;  US  44.1 AP% 16.5 CDE 7.30 Fluid Pack Lot #[id number]   CATARACT EXTRACTION W/PHACO Left 10/19/2016   Procedure: CATARACT EXTRACTION PHACO AND INTRAOCULAR LENS PLACEMENT (IOC);  Surgeon: Elsie Carmine, MD;  Location: [facility] ORS;  Service: Ophthalmology;  Laterality: Left;  US  53.7 AP% 19.5 CDE 10.45 Fluid pack lot # [id number]   CHOLECYSTECTOMY     COMBINED AUGMENTATION MAMMAPLASTY AND ABDOMINOPLASTY     JOINT REPLACEMENT Left 06/04/2013   left knee   KNEE ARTHROSCOPY Right 08/16/2016   Procedure: ARTHROSCOPY KNEE, tear posterior horn medial meniscus, tear anterior and posterior horns  of lateral meniscus, chondromalacia of lateral compartment grade 3 patella and grade 4 medial;  Surgeon: Lynwood SHAUNNA Hue, MD;  Location: [facility] ORS;  Service: Orthopedics;  Laterality: Right;   LEFT HEART CATH AND CORONARY ANGIOGRAPHY Left 07/27/2019   Procedure: LEFT HEART CATH AND CORONARY ANGIOGRAPHY;  Surgeon: Cage Deatrice DELENA, MD;  Location: [facility] INVASIVE CV LAB;  Service: Cardiovascular;  Laterality: Left;   MASTECTOMY  1986   nipple sparing mastectomy/Bilateral with silicone  breast implants, s/p saline replacements   Multiple orthopedic procedures     NOSE SURGERY     PACEMAKER IMPLANT N/A 04/13/2023   Procedure: PACEMAKER IMPLANT;  Surgeon: Fernande Elspeth BROCKS, MD;  Location: [facility] INVASIVE CV LAB;  Service: Cardiovascular;  Laterality: N/A;   TEE WITHOUT CARDIOVERSION N/A 09/26/2017   Procedure: TRANSESOPHAGEAL ECHOCARDIOGRAM (TEE);  Surgeon: Okey Vina GAILS, MD;  Location: [facility] ENDOSCOPY;  Service: Cardiovascular;  Laterality: N/A;   TOTAL KNEE ARTHROPLASTY Left     SOCIAL HISTORY: Social History   Socioeconomic History   Marital status: Married    Spouse name: Not on file   Number of children: 1   Years of education: Not on file  Highest education level: Not on file  Occupational History    Employer: RETIRED  Tobacco Use   Smoking status: Never   Smokeless tobacco: Never  Vaping Use   Vaping status: Never Used  Substance and Sexual Activity   Alcohol  use: No   Drug use: No   Sexual activity: Never    Birth control/protection: Surgical  Other Topics Concern   Not on file  Social History Narrative   Married    Social Drivers of Health   Tobacco Use: Low Risk (01/07/2025)   Patient History    Smoking Tobacco Use: Never    Smokeless Tobacco Use: Never    Passive Exposure: Not on file  Financial Resource Strain: Medium Risk (12/05/2024)   Received from [facility]   Overall Financial Resource Strain (CARDIA)    Difficulty of Paying Living Expenses: Somewhat  hard  Food Insecurity: No Food Insecurity (01/08/2025)   Epic    Worried About Programme Researcher, Broadcasting/film/video in the Last Year: Never true    Ran Out of Food in the Last Year: Never true  Recent Concern: Food Insecurity - Food Insecurity Present (12/05/2024)   Received from [facility]   Epic    Within the past 12 months, you worried that your food would run out before you got the money to buy more.: Sometimes true    Within the past 12 months, the food you bought just didn't last and you didn't have money to get more.: Never true  Transportation Needs: No Transportation Needs (01/08/2025)   Epic    Lack of Transportation (Medical): No    Lack of Transportation (Non-Medical): No  Physical Activity: Inactive (12/04/2024)   Exercise Vital Sign    Days of Exercise per Week: 0 days    Minutes of Exercise per Session: 0 min  Stress: No Stress Concern Present (12/04/2024)   [employer] of Occupational Health - Occupational Stress Questionnaire    Feeling of Stress: Not at all  Social Connections: Unknown (01/08/2025)   Social Connection and Isolation Panel    Frequency of Communication with Friends and Family: More than three times a week    Frequency of Social Gatherings with Friends and Family: More than three times a week    Attends Religious Services: Never    Database Administrator or Organizations: Patient unable to answer    Attends Banker Meetings: Patient unable to answer    Marital Status: Married  Recent Concern: Social Connections - Moderately Isolated (12/04/2024)   Social Connection and Isolation Panel    Frequency of Communication with Friends and Family: Never    Frequency of Social Gatherings with Friends and Family: Once a week    Attends Religious Services: Never    Database Administrator or Organizations: Yes    Attends Banker Meetings: Never    Marital Status: Married  Catering Manager Violence: Patient Unable To Answer  (01/08/2025)   Epic    Fear of Current or Ex-Partner: Patient unable to answer    Emotionally Abused: Patient unable to answer    Physically Abused: Patient unable to answer    Sexually Abused: Patient unable to answer  Depression (PHQ2-9): Medium Risk (12/04/2024)   Depression (PHQ2-9)    PHQ-2 Score: 8  Alcohol  Screen: Low Risk (12/04/2024)   Alcohol  Screen    Last Alcohol  Screening Score (AUDIT): 0  Housing: Low Risk (01/08/2025)   Epic    Unable to  Pay for Housing in the Last Year: No    Number of Times Moved in the Last Year: 0    Homeless in the Last Year: No  Utilities: Not At Risk (01/08/2025)   Epic    Threatened with loss of utilities: No  Health Literacy: Inadequate Health Literacy (12/04/2024)   B1300 Health Literacy    Frequency of need for help with medical instructions: Always    FAMILY HISTORY: Family History  Problem Relation Age of Onset   Heart disease Mother    Stomach cancer Father    Breast cancer Sister 61   Breast cancer Sister 42   Breast cancer Sister 69   Breast cancer Sister    Breast cancer Maternal Grandmother    Diabetes Brother    Esophageal cancer Brother    Kidney failure Brother    Heart disease Son        found at autopsy   Ovarian cancer Neg Hx     ALLERGIES:  is allergic to cyclobenzaprine, eliquis  [apixaban ], iodine , biaxin [clarithromycin], codeine, digoxin  and related, diltiazem , enalapril, famotidine , fluocinonide, iodinated contrast media, iodine -131, losartan  potassium, omnicef [cefdinir], oxycodone, promethazine , sertraline , tizanidine, tizanidine hcl, warfarin and related, and xarelto  [rivaroxaban ].  MEDICATIONS:  Current Facility-Administered Medications  Medication Dose Route Frequency Provider Last Rate Last Admin   acetaminophen  (TYLENOL ) tablet 650 mg  650 mg Oral Q6H PRN Khan, Ghalib, MD   650 mg at 01/09/25 9096   Or   acetaminophen  (TYLENOL ) suppository 650 mg  650 mg Rectal Q6H PRN Fernand Prost, MD        cefTRIAXone  (ROCEPHIN ) 1 g in sodium chloride  0.9 % 100 mL IVPB  1 g Intravenous Q24H Khan, Ghalib, MD 200 mL/hr at 01/09/25 0051 1 g at 01/09/25 0051   feeding supplement (ENSURE PLUS HIGH PROTEIN) liquid 237 mL  237 mL Oral BID BM Amin, Sumayya, MD   237 mL at 01/09/25 1302   heparin  injection 5,000 Units  5,000 Units Subcutaneous Q8H Khan, Ghalib, MD   5,000 Units at 01/09/25 1302   insulin  aspart (novoLOG ) injection 0-5 Units  0-5 Units Subcutaneous QHS Fernand Prost, MD       insulin  aspart (novoLOG ) injection 0-9 Units  0-9 Units Subcutaneous TID WC Khan, Ghalib, MD   1 Units at 01/09/25 9096   lactated ringers  infusion   Intravenous Continuous Amin, Sumayya, MD 100 mL/hr at 01/09/25 0908 New Bag at 01/09/25 0908   OLANZapine  (ZYPREXA ) injection 2.5 mg  2.5 mg Intramuscular PRN Khan, Ghalib, MD   2.5 mg at 01/07/25 1911   ondansetron  (ZOFRAN ) tablet 4 mg  4 mg Oral Q6H PRN Fernand Prost, MD       Or   ondansetron  (ZOFRAN ) injection 4 mg  4 mg Intravenous Q6H PRN Fernand Prost, MD       pantoprazole  (PROTONIX ) EC tablet 40 mg  40 mg Oral Daily Amin, Sumayya, MD       senna-docusate (Senokot-S) tablet 1 tablet  1 tablet Oral QHS PRN Fernand Prost, MD       sodium chloride  flush (NS) 0.9 % injection 3 mL  3 mL Intravenous Q12H Fernand Prost, MD   3 mL at 01/09/25 0903    PHYSICAL EXAMINATION:   Vitals:   01/09/25 1556 01/09/25 1559  BP:  (!) 112/44  Pulse:  60  Resp:    Temp: 97.6 F (36.4 C) 97.6 F (36.4 C)  SpO2:  92%   Filed Weights   01/07/25 1145 01/09/25 0500  Weight: 139 lb (63 kg) 131 lb 6.3 oz (59.6 kg)    Physical Exam Vitals and nursing note reviewed.  Constitutional:      Appearance: She is ill-appearing.  HENT:     Head: Normocephalic and atraumatic.     Mouth/Throat:     Pharynx: Oropharynx is clear.  Eyes:     Extraocular Movements: Extraocular movements intact.     Pupils: Pupils are equal, round, and reactive to light.  Cardiovascular:     Rate and Rhythm:  Normal rate and regular rhythm.  Pulmonary:     Comments: Decreased breath sounds bilaterally.  Abdominal:     Palpations: Abdomen is soft.  Musculoskeletal:        General: Normal range of motion.     Cervical back: Normal range of motion.  Skin:    General: Skin is warm.  Neurological:     General: No focal deficit present.     LABORATORY DATA:  I have reviewed the data as listed Lab Results  Component Value Date   WBC 11.9 (H) 01/08/2025   HGB 11.5 (L) 01/08/2025   HCT 35.2 (L) 01/08/2025   MCV 100.3 (H) 01/08/2025   PLT 177 01/08/2025   Recent Labs    06/27/24 1621 07/19/24 1428 10/24/24 1456 11/26/24 2217 01/07/25 1147 01/07/25 2224 01/08/25 0254 01/09/25 0433  NA 136   < > 133* 130*   < > 140 139 142  K 4.2   < > 4.2 4.6   < > 4.7 4.8 4.6  CL 103   < > 96 95*   < > 100 99 104  CO2 23   < > 29 24   < > 25 24 24   GLUCOSE 114*   < > 102* 100*   < > 121* 112* 126*  BUN 22   < > 19 18   < > 52* 55* 62*  CREATININE 0.90   < > 1.07 1.01*   < > 2.25* 2.40* 2.49*  CALCIUM 9.5   < > 9.1 9.3   < > 10.7* 10.6* 10.4*  GFRNONAA >60  --   --  54*   < > 21* 19* 18*  PROT 7.4  --  6.6 6.7  --   --   --   --   ALBUMIN 3.9  --  4.1 4.2  --   --   --   --   AST 30  --  20 32  --   --   --   --   ALT 24  --  20 24  --   --   --   --   ALKPHOS 88  --  52 65  --   --   --   --   BILITOT 1.3*  --  1.2 0.7  --   --   --   --    < > = values in this interval not displayed.    RADIOGRAPHIC STUDIES: I have personally reviewed the radiological images as listed and agreed with the findings in the report. CT ABDOMEN PELVIS WO CONTRAST Result Date: 01/08/2025 CLINICAL DATA:  Abdominal pain, acute, nonlocalized Per previous chest CT, history of breast cancer and neuroendocrine carcinoma of unknown origin. Multifocal metastatic disease. * Tracking Code: BO * EXAM: CT ABDOMEN AND PELVIS WITHOUT CONTRAST TECHNIQUE: Multidetector CT imaging of the abdomen and pelvis was performed following  the standard protocol without IV contrast. RADIATION DOSE REDUCTION: This exam was performed according  to the departmental dose-optimization program which includes automated exposure control, adjustment of the mA and/or kV according to patient size and/or use of iterative reconstruction technique. COMPARISON:  Abdominopelvic CT 10/17/2015.  Chest CT 01/08/2025. FINDINGS: Lower chest: Patchy dependent airspace opacities in both lung bases, similar to recent chest CT. Stable cardiomegaly and left subclavian pacemaker leads. There is aortic and coronary artery atherosclerosis. Hepatobiliary: As demonstrated on recent chest CT, there is widespread metastatic disease throughout the liver. There is a dominant mass within the left hepatic lobe measuring up to 12.9 x 9.3 cm. There are innumerable smaller lesions throughout the right lobe, including a lesion measuring 3.2 cm on image 23/2 and a 3.0 cm lesion on image 35/2. These liver lesions are new compared with the remote abdominal CT. Status post cholecystectomy without significant biliary dilatation. Pancreas: Pancreatic atrophy. Suspected surrounding retroperitoneal adenopathy without definite focal abnormality of the pancreas on this noncontrast study. Spleen: Normal in size without focal abnormality. Adrenals/Urinary Tract: Possible new ill-defined left adrenal nodule measuring 2.4 x 1.5 cm on image 28/2. The right adrenal gland demonstrates no significant findings. No evidence of urinary tract calculus or hydronephrosis. No focal renal masses are identified. The bladder appears unremarkable for its degree of distention. Stomach/Bowel: No enteric contrast administered. The stomach appears unremarkable for its degree of distension. No evidence of bowel wall thickening, distention or surrounding inflammatory change. Diverticular changes in the distal colon. Vascular/Lymphatic: Assessment for nodal disease limited by lack of intravenous and enteric contrast. There is  increased soft tissue thickening and nodularity throughout the retroperitoneum, suspicious for adenopathy. No evidence of significant pelvic adenopathy. Aortic and branch vessel atherosclerosis. Reproductive: Hysterectomy.  No evidence of adnexal mass. Other: Generalized soft tissue edema suspicious for anasarca. Small amount of ascites. No evidence of pneumoperitoneum. Musculoskeletal: Bilateral breast implants. Lytic lesion in the T12 vertebral body measuring up to 1.9 cm on image 23/2, consistent with metastatic disease. No evidence of pathologic fracture. Multilevel spondylosis. IMPRESSION: 1. Widespread metastatic disease throughout the liver, new compared with remote abdominal CT from 2016. 2. Suspected retroperitoneal adenopathy, suboptimally evaluated without intravenous and enteric contrast. 3. Possible new ill-defined left adrenal nodule, suspicious for metastatic disease. 4. Lytic lesion in the T12 vertebral body, consistent with metastatic disease. No evidence of pathologic fracture. 5. Patchy dependent airspace opacities in both lung bases, similar to recent chest CT. 6.  Aortic Atherosclerosis (ICD10-I70.0). Electronically Signed   By: Elsie Perone M.D.   On: 01/08/2025 15:39   CT CHEST WO CONTRAST Result Date: 01/08/2025 CLINICAL DATA:  Follow-up of pneumonia. Prior history includes breast cancer and neuroendocrine carcinoma of unknown origin. * Tracking Code: BO * EXAM: CT CHEST WITHOUT CONTRAST TECHNIQUE: Multidetector CT imaging of the chest was performed following the standard protocol without IV contrast. RADIATION DOSE REDUCTION: This exam was performed according to the departmental dose-optimization program which includes automated exposure control, adjustment of the mA and/or kV according to patient size and/or use of iterative reconstruction technique. COMPARISON:  Chest radiograph of 01/07/2025.  No comparison CT. FINDINGS: Cardiovascular: Multifactorial degradation, including mild  motion, arm position, EKG wires and lead artifacts. Aortic atherosclerosis. Tortuous thoracic aorta. Moderate to marked cardiomegaly. Right ventricular pacer. Multivessel coronary artery atherosclerosis. Pulmonary artery enlargement, outflow tract 3.4 cm. Mediastinum/Nodes: No axillary adenopathy. No mediastinal or hilar adenopathy, given limitations of unenhanced CT. Retrocrural adenopathy at 9 mm on image 120/2. Lungs/Pleura: Trace right pleural fluid. Bibasilar scarring and/or subsegmental atelectasis. No evidence of pneumonia. Upper Abdomen: Innumerable bilateral hepatic  masses. Incompletely imaged dominant mass replacing the majority of the lateral segment left liver lobe at 10.8 x 9.0 cm on 154/2. Normal imaged portions of the spleen, stomach, pancreas. Cholecystectomy. Musculoskeletal: Bilateral breast implants with left-sided intracapsular rupture. Multifocal lytic lesions. Example within the right glenoid at 1.9 cm on image 25/2. Within the T12 vertebral body at 2.0 cm in sagittal image 79. IMPRESSION: 1. Multifactorial degradation, as detailed above. 2. No evidence of pneumonia.  Trace right pleural fluid. 3. Large volume metastatic disease, including to the liver, bones, and likely lower thoracic nodes. 4. Pulmonary artery enlargement suggests pulmonary arterial hypertension. 5. Cardiomegaly. Coronary artery atherosclerosis. Aortic Atherosclerosis (ICD10-I70.0). These results will be called to the ordering clinician or representative by the Radiologist Assistant, and communication documented in the PACS or [employer]. Electronically Signed   By: Rockey Kilts M.D.   On: 01/08/2025 14:16   DG Shoulder Left Port Result Date: 01/08/2025 CLINICAL DATA:  Proximal humerus fracture. EXAM: LEFT SHOULDER COMPARISON:  Humerus radiograph 01/01/2025 FINDINGS: Slightly increased impaction of proximal humeral neck fracture. No significant peripheral callus formation. Glenohumeral alignment is unchanged.  Acromioclavicular alignment is normal. IMPRESSION: Slightly increased impaction of proximal humeral neck fracture. Electronically Signed   By: Andrea Gasman M.D.   On: 01/08/2025 14:12   CT HEAD WO CONTRAST ( ) Result Date: 01/07/2025 EXAM: CT HEAD WITHOUT CONTRAST 01/07/2025 02:12:47 PM TECHNIQUE: CT of the head was performed without the administration of intravenous contrast. Automated exposure control, iterative reconstruction, and/or weight based adjustment of the mA/kV was utilized to reduce the radiation dose to as low as reasonably achievable. COMPARISON: Head CT 11/26/2024. CLINICAL HISTORY: fall 1 wk ago, worsening AMS FINDINGS: BRAIN AND VENTRICLES: There is no evidence of an acute infarct, intracranial hemorrhage, mass, midline shift, hydrocephalus, or extra-axial fluid collection. There is mild cerebral atrophy. Cerebral white matter hypodensities are unchanged and nonspecific but compatible with mild chronic small vessel ischemic disease. ORBITS: Bilateral cataract extraction. SINUSES: Small mucous retention cyst in the right maxillary sinus. Small right and trace left mastoid effusions. SOFT TISSUES AND SKULL: No acute soft tissue abnormality. No skull fracture. IMPRESSION: 1. No acute intracranial abnormality. 2. Mild chronic small vessel ischemic disease. Electronically signed by: Dasie Hamburg MD 01/07/2025 02:19 PM EST RP Workstation: [id number]   DG Chest 2 View Result Date: 01/07/2025 CLINICAL DATA:  Chest pain.  Hypertension.  Atrial fibrillation. EXAM: CHEST - 2 VIEW COMPARISON:  11/26/2024 FINDINGS: Unchanged mild cardiomegaly. Interval development of mild pulmonary vascular congestion. Mild bibasilar opacities likely due to atelectasis. Single lead LEFT chest wall pacemaker is unchanged in configuration. IMPRESSION: Mild cardiomegaly with interval worsening of pulmonary vascular congestion suspicious for CHF/fluid volume overload. Electronically Signed   By: Aliene Lloyd M.D.   On:  01/07/2025 12:34   DG Humerus Left Result Date: 01/01/2025 EXAM: 1 VIEW XRAY OF THE LEFT HUMERUS 01/01/2025 12:49:00 PM COMPARISON: None available. CLINICAL HISTORY: The patient experienced a fall. FINDINGS: BONES AND JOINTS: Cortical irregularity of the anatomic neck of the humerus, consistent with an acute fracture. Osteopenia. No malalignment. SOFT TISSUES: Unremarkable. IMPRESSION: 1. Osteopenia. Findings worrisome for a minimally displaced fracture of the anatomic neck of the humerus. Electronically signed by: Rogelia Myers MD MD 01/01/2025 01:26 PM EST RP Workstation: [id number]   DG Forearm Left Result Date: 01/01/2025 CLINICAL DATA:  Bilateral arm pain after fall EXAM: LEFT FOREARM - 2 VIEW COMPARISON:  None Available. FINDINGS: There is no evidence of fracture or other focal bone lesions. Soft tissues  are unremarkable. IMPRESSION: Negative. Electronically Signed   By: Lynwood Landy Raddle M.D.   On: 01/01/2025 11:53    Based on imaging highly suspicious for stage IV metastatic cancer involving liver, bone, adrenal gland, and lymph nodes Rapidly progressive, aggressive, incurable metastatic cancer with extensive liver, spine, adrenal gland, and lymph node involvement. Poor prognosis. Not a candidate for chemotherapy due to advanced dementia and poor functional status. Biopsy would not alter management. - Reviewed CT findings with her and family, showing extensive metastatic disease. - Discussed incurable, aggressive malignancy. - Recommended against biopsy as it would not impact management. - Recommended hospice care for comfort and quality of life. - Referred to palliative care for hospice transition and symptom management.  Advanced dementia Advanced dementia limits decision-making capacity and increases risk for complications from oncologic therapies. Poor mobility and minimal oral intake contribute to frailty and poor prognosis. - Discussed with family that advanced dementia precludes safe  chemotherapy administration and impairs complication reporting.  # Introduced Fonda Borders from palliative care, would recommend home hospice as per patient's family's wishes. Above plan of care was discussed with patient's husband and stepson in detail.      Cindy JONELLE Joe, MD 01/09/2025 4:09 PM

## 2025-01-09 NOTE — Hospital Course (Addendum)
 Partly taken from prior notes.  [name] [name] is a 89 y.o. year old female with medical history of hypertension, hyperlipidemia, type 2 diabetes, CHF (normal EF and 06/24), atrial fibrillation, status post pacemaker, MDD, GAD presenting to the ED with worsening somnolence.   On presentation vital stable, labs with mild leukocytosis, mild macrocytic anemia.  AKI, hypercalcemia.  Elevated troponin with a flat curve.  UA concerning for UTI-cultures were obtained.  proBNP elevated around 10K.  CT head was negative for any acute abnormality, chest x-ray with cardiomegaly and worsening pulmonary vascular congestion.  Patient was initially started on ceftriaxone  and was admitted for further evaluation.  CT chest was later obtained for further evaluation of abnormal chest x-ray which shows no concern of pneumonia, trace right pleural fluid but did show a large volume metastatic disease including liver, bones and likely lower thoracic nodes.  Pulmonary artery enlargement suggesting pulmonary arterial hypertension.  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  Orthopedic surgery was also consulted for her recent left humeral fracture secondary to fall on 1/13 and they were recommending conservative management with sling and PT.  1/21: Vital stable, slight worsening of renal function with BUN 62, creatinine 2.49, urine cultures with Klebsiella pneumonia, only shows resistant to ampicillin.  Calcium of 10.4.  Anemia panel with no specific deficiency. Ordered parathyroid hormone and phosphorous. Giving some more IV fluid. Palliative care was also consulted.  Later family met with oncology and palliative care, they decided to transition her to full comfort care and home with hospice.  [facility] was also consulted.  Patient likely be going home with hospice tomorrow after  equipments are delivered.  1/22: Remained hemodynamically stable.  Parathyroid and phosphorous levels were normal.  Patient remained lethargic with a very poor prognosis.  She is being discharged home according to the decision made by family with the help of hospice.  Equipments were delivered before the discharge.  She was given as needed medications for pain, agitation and nausea.

## 2025-01-09 NOTE — Assessment & Plan Note (Signed)
 Patient with history of advanced dementia and was taking Aricept  at home which she can continue.

## 2025-01-09 NOTE — Assessment & Plan Note (Signed)
 Likely multifactorial with AKI, extensive metastatic disease and hypercalcemia. Remained very lethargic and encephalopathic. - Family now decided to proceed with comfort care.

## 2025-01-09 NOTE — Assessment & Plan Note (Signed)
 Likely secondary to underlying extensive metastatic disease. Protein electrophoresis and parathyroid labs are pending. - Patient is now comfort care

## 2025-01-10 ENCOUNTER — Other Ambulatory Visit: Payer: Self-pay

## 2025-01-10 DIAGNOSIS — R4182 Altered mental status, unspecified: Secondary | ICD-10-CM

## 2025-01-10 DIAGNOSIS — I272 Pulmonary hypertension, unspecified: Secondary | ICD-10-CM

## 2025-01-10 LAB — URINE CULTURE: Culture: >=100000 — AB

## 2025-01-10 LAB — GLUCOSE, CAPILLARY
Glucose-Capillary: 110 mg/dL — ABNORMAL HIGH (ref 70–99)
Glucose-Capillary: 145 mg/dL — ABNORMAL HIGH (ref 70–99)
Glucose-Capillary: 130 mg/dL — ABNORMAL HIGH (ref 70–99)

## 2025-01-10 LAB — PHOSPHORUS: Phosphorus: 3.3 mg/dL (ref 2.5–4.6)

## 2025-01-10 LAB — PTH, INTACT AND CALCIUM
Calcium, Total (PTH): 9.9 mg/dL (ref 8.7–10.3)
PTH: 29 pg/mL (ref 15–65)

## 2025-01-10 MED ORDER — MORPHINE SULFATE (CONCENTRATE) 20 MG/ML PO SOLN
10.0000 mg | ORAL | 0 refills | Status: DC | PRN
  Filled 2025-01-10: qty 30, 10d supply, fill #0

## 2025-01-10 MED ORDER — ENSURE PLUS HIGH PROTEIN PO LIQD
237.0000 mL | Freq: Two times a day (BID) | ORAL | 2 refills | Status: DC
  Filled 2025-01-10: qty 10000, 21d supply, fill #0

## 2025-01-10 MED ORDER — ENSURE PLUS HIGH PROTEIN PO LIQD: 237.0000 mL | Freq: Two times a day (BID) | ORAL | 2 refills | Status: DC

## 2025-01-10 MED ORDER — CEPHALEXIN 250 MG/5ML PO SUSR
500.0000 mg | Freq: Every day | ORAL | 0 refills | Status: AC
  Filled 2025-01-10: qty 40, 4d supply, fill #0

## 2025-01-10 MED ORDER — LORAZEPAM 2 MG/ML PO CONC
ORAL | 0 refills | Status: DC | PRN
  Filled 2025-01-10: qty 30, 10d supply, fill #0

## 2025-01-10 MED ORDER — POTASSIUM CHLORIDE CRYS ER 20 MEQ PO TBCR
20.0000 meq | EXTENDED_RELEASE_TABLET | Freq: Every day | ORAL | 3 refills | Status: DC | PRN
  Filled 2025-01-10: qty 90, 90d supply, fill #0

## 2025-01-10 MED ORDER — CEPHALEXIN 250 MG/5ML PO SUSR
500.0000 mg | Freq: Three times a day (TID) | ORAL | 0 refills | Status: DC
  Filled 2025-01-10: qty 100, 4d supply, fill #0

## 2025-01-10 MED ORDER — SENNOSIDES-DOCUSATE SODIUM 8.6-50 MG PO TABS
1.0000 | ORAL_TABLET | Freq: Every evening | ORAL | 1 refills | Status: DC | PRN
  Filled 2025-01-10: qty 30, 30d supply, fill #0

## 2025-01-10 MED ORDER — HALOPERIDOL LACTATE 2 MG/ML PO CONC
2.0000 mg | ORAL | 0 refills | Status: DC | PRN
  Filled 2025-01-10: qty 15, 3d supply, fill #0

## 2025-01-10 MED ORDER — TORSEMIDE 20 MG PO TABS
20.0000 mg | ORAL_TABLET | Freq: Every day | ORAL | 3 refills | Status: DC | PRN
  Filled 2025-01-10: qty 90, 90d supply, fill #0

## 2025-01-10 MED ORDER — ONDANSETRON 4 MG PO TBDP
4.0000 mg | ORAL_TABLET | Freq: Three times a day (TID) | ORAL | 0 refills | Status: DC | PRN
  Filled 2025-01-10: qty 20, 7d supply, fill #0

## 2025-01-10 NOTE — TOC Transition Note (Signed)
 Transition of Care [facility]) - Discharge Note   Patient Details  Name: [name] [name] MRN: [id number] Date of Birth: 1936/05/20  Transition of [facility]) CM/SW Contact:  Shasta DELENA Daring, RN Phone Number: 01/10/2025, 1:32 PM   Clinical Narrative:     Patient has discharge orders. Will discharge home with hospice.  [facility] contacted for transport. Authorization # [id number]  No additional TOC needs. RNCM signing off.   Final next level of care: Home w Hospice Care Barriers to Discharge: Barriers Resolved   Patient Goals and CMS Choice            Discharge Placement                Patient to be transferred to facility by: [facility] Name of family member notified: family Patient and family notified of of transfer: 01/10/25  Discharge Plan and Services Additional resources added to the After Visit Summary for                    DME Agency: Other - Comment [employer])                  Social Drivers of Health (SDOH) Interventions SDOH Screenings   Food Insecurity: No Food Insecurity (01/08/2025)  Recent Concern: Food Insecurity - Food Insecurity Present (12/05/2024)   Received from [facility]  Housing: Low Risk (01/08/2025)  Transportation Needs: No Transportation Needs (01/08/2025)  Utilities: Not At Risk (01/08/2025)  Alcohol  Screen: Low Risk (12/04/2024)  Depression (PHQ2-9): Medium Risk (12/04/2024)  Financial Resource Strain: Medium Risk (12/05/2024)   Received from [facility]  Physical Activity: Inactive (12/04/2024)  Social Connections: Unknown (01/08/2025)  Recent Concern: Social Connections - Moderately Isolated (12/04/2024)  Stress: No Stress Concern Present (12/04/2024)  Tobacco Use: Low Risk (01/07/2025)  Health Literacy: Inadequate Health Literacy (12/04/2024)     Readmission Risk Interventions     No data to display

## 2025-01-10 NOTE — Plan of Care (Signed)
" °  Problem: Education: Goal: Ability to describe self-care measures that may prevent or decrease complications (Diabetes Survival Skills Education) will improve Outcome: Progressing Goal: Individualized Educational Video(s) Outcome: Progressing   Problem: Coping: Goal: Ability to adjust to condition or change in health will improve Outcome: Progressing   Problem: Fluid Volume: Goal: Ability to maintain a balanced intake and output will improve Outcome: Progressing   Problem: Health Behavior/Discharge Planning: Goal: Ability to identify and utilize available resources and services will improve Outcome: Progressing Goal: Ability to manage health-related needs will improve Outcome: Progressing   Problem: Metabolic: Goal: Ability to maintain appropriate glucose levels will improve Outcome: Progressing   Problem: Nutritional: Goal: Maintenance of adequate nutrition will improve Outcome: Progressing Goal: Progress toward achieving an optimal weight will improve Outcome: Progressing   Problem: Skin Integrity: Goal: Risk for impaired skin integrity will decrease Outcome: Progressing   Problem: Tissue Perfusion: Goal: Adequacy of tissue perfusion will improve Outcome: Progressing   Problem: Education: Goal: Knowledge of General Education information will improve Description: Including pain rating scale, medication(s)/side effects and non-pharmacologic comfort measures Outcome: Progressing   Problem: Health Behavior/Discharge Planning: Goal: Ability to manage health-related needs will improve Outcome: Progressing   Problem: Clinical Measurements: Goal: Ability to maintain clinical measurements within normal limits will improve Outcome: Progressing Goal: Will remain free from infection Outcome: Progressing Goal: Diagnostic test results will improve Outcome: Progressing Goal: Respiratory complications will improve Outcome: Progressing Goal: Cardiovascular complication will  be avoided Outcome: Progressing   Problem: Clinical Measurements: Goal: Will remain free from infection Outcome: Progressing   Problem: Clinical Measurements: Goal: Will remain free from infection Outcome: Progressing   Problem: Activity: Goal: Risk for activity intolerance will decrease Outcome: Progressing   Problem: Nutrition: Goal: Adequate nutrition will be maintained Outcome: Progressing   Problem: Coping: Goal: Level of anxiety will decrease Outcome: Progressing   Problem: Elimination: Goal: Will not experience complications related to bowel motility Outcome: Progressing Goal: Will not experience complications related to urinary retention Outcome: Progressing   Problem: Pain Managment: Goal: General experience of comfort will improve and/or be controlled Outcome: Progressing   Problem: Safety: Goal: Ability to remain free from injury will improve Outcome: Progressing   Problem: Skin Integrity: Goal: Risk for impaired skin integrity will decrease Outcome: Progressing   "

## 2025-01-10 NOTE — Plan of Care (Signed)
" °  Problem: Education: Goal: Ability to describe self-care measures that may prevent or decrease complications (Diabetes Survival Skills Education) will improve Outcome: Adequate for Discharge Goal: Individualized Educational Video(s) Outcome: Adequate for Discharge   Problem: Coping: Goal: Ability to adjust to condition or change in health will improve Outcome: Adequate for Discharge   Problem: Fluid Volume: Goal: Ability to maintain a balanced intake and output will improve Outcome: Adequate for Discharge   Problem: Health Behavior/Discharge Planning: Goal: Ability to identify and utilize available resources and services will improve Outcome: Adequate for Discharge Goal: Ability to manage health-related needs will improve Outcome: Adequate for Discharge   Problem: Metabolic: Goal: Ability to maintain appropriate glucose levels will improve Outcome: Adequate for Discharge   Problem: Nutritional: Goal: Maintenance of adequate nutrition will improve Outcome: Adequate for Discharge Goal: Progress toward achieving an optimal weight will improve Outcome: Adequate for Discharge   Problem: Skin Integrity: Goal: Risk for impaired skin integrity will decrease Outcome: Adequate for Discharge   Problem: Tissue Perfusion: Goal: Adequacy of tissue perfusion will improve Outcome: Adequate for Discharge   Problem: Education: Goal: Knowledge of General Education information will improve Description: Including pain rating scale, medication(s)/side effects and non-pharmacologic comfort measures Outcome: Adequate for Discharge   Problem: Health Behavior/Discharge Planning: Goal: Ability to manage health-related needs will improve Outcome: Adequate for Discharge   Problem: Clinical Measurements: Goal: Ability to maintain clinical measurements within normal limits will improve Outcome: Adequate for Discharge Goal: Will remain free from infection Outcome: Adequate for Discharge Goal:  Diagnostic test results will improve Outcome: Adequate for Discharge Goal: Respiratory complications will improve Outcome: Adequate for Discharge Goal: Cardiovascular complication will be avoided Outcome: Adequate for Discharge   Problem: Activity: Goal: Risk for activity intolerance will decrease Outcome: Adequate for Discharge   Problem: Nutrition: Goal: Adequate nutrition will be maintained Outcome: Adequate for Discharge   Problem: Coping: Goal: Level of anxiety will decrease Outcome: Adequate for Discharge   Problem: Elimination: Goal: Will not experience complications related to bowel motility Outcome: Adequate for Discharge Goal: Will not experience complications related to urinary retention Outcome: Adequate for Discharge   Problem: Pain Managment: Goal: General experience of comfort will improve and/or be controlled Outcome: Adequate for Discharge   Problem: Safety: Goal: Ability to remain free from injury will improve Outcome: Adequate for Discharge   Problem: Skin Integrity: Goal: Risk for impaired skin integrity will decrease Outcome: Adequate for Discharge   Problem: Acute Rehab PT Goals(only PT should resolve) Goal: Pt Will Go Sit To Supine/Side Outcome: Adequate for Discharge Goal: Pt Will Transfer Bed To Chair/Chair To Bed Outcome: Adequate for Discharge Goal: Pt Will Ambulate Outcome: Adequate for Discharge Goal: Pt Will Go Up/Down Stairs Outcome: Adequate for Discharge   Problem: Malnutrition  (NI-5.2) Goal: Food and/or nutrient delivery Description: Individualized approach for food/nutrient provision. Outcome: Adequate for Discharge   "

## 2025-01-10 NOTE — Progress Notes (Signed)
 Patient ID: [name] [name], female   DOB: 08-18-1936, 88 y.o.   MRN: [id number]  Subjective: No new complaints regarding the patient's left shoulder.  Overall, she feels that her symptoms are manageable at this time and feels comfortable in her sling.   Objective: Vital signs in last 24 hours: Temp:  [97.6 F (36.4 C)-99.9 F (37.7 C)] 98.6 F (37 C) (01/22 1236) Pulse Rate:  [54-66] 59 (01/22 1205) Resp:  [18-24] 18 (01/22 1205) BP: (104-124)/(43-56) 124/56 (01/22 1205) SpO2:  [91 %-95 %] 95 % (01/22 1205) Weight:  [60 kg] 60 kg (01/22 0518)  Intake/Output from previous day: 01/21 0701 - 01/22 0700 In: 320 [P.O.:120; IV Piggyback:200] Out: 400 [Urine:400] Intake/Output this shift: No intake/output data recorded.  Recent Labs    01/08/25 0254  HGB 11.5*   Recent Labs    01/08/25 0254 01/09/25 0956  WBC 11.9*  --   RBC 3.51* 3.24*  HCT 35.2*  --   PLT 177  --    Recent Labs    01/08/25 0254 01/09/25 0433 01/09/25 0956  NA 139 142  --   K 4.8 4.6  --   CL 99 104  --   CO2 24 24  --   BUN 55* 62*  --   CREATININE 2.40* 2.49*  --   GLUCOSE 112* 126*  --   CALCIUM 10.6* 10.4* 9.9   No results for input(s): LABPT, INR in the last 72 hours.  Physical Exam: Orthopedic examination again is limited to the left shoulder and upper extremity.  The patient's arm is in a sling as ordered.  There is still mild swelling with resolving ecchymosis over the anterior aspect of the upper arm.  She has mild tenderness to firm palpation over this area.  She has more mild-moderate discomfort with attempted active or passive motion of the shoulder.  She remains neurovascularly intact to the left upper extremity and hand.  X-rays: Repeat x-rays of the left shoulder are obtained.  These films demonstrate satisfactory maintenance of fracture alignment in both the AP and Y-scapular views.  The humeral head is properly located within the glenoid.  No new acute bony abnormalities are  identified.  Assessment: Essentially nondisplaced left proximal humerus fracture.  Plan: The treatment options have been reviewed with the patient and her husband who is at the bedside.  We will continue to manage this patient nonsurgically with a shoulder sling for the next several weeks.  Assuming she is still with us , she may then begin some therapy exercises to work on range of motion and strength exercises, progressing them as symptoms permit.  Please arrange for this patient to follow-up in our [place] office in 2 weeks with one of our PAs for repeat x-rays of her shoulder.    Thank you for asking me to participate in the care of this most pleasant young unfortunate woman.  I will sign off at this time.  If there is further need of orthopedic input during this hospitalization, please reconsult me.   [name] Lucca Ballo 01/10/2025, 2:18 PM

## 2025-01-10 NOTE — Discharge Summary (Signed)
 " Physician Discharge Summary   [name] MRN: [id number] DOB: [birth year]  Admit date:     01/07/2025  Discharge date: 01/10/25  Discharge Physician: Amaryllis Dare   PCP: Marylynn Verneita CROME, MD   Recommendations at discharge:  Patient is being discharged home with hospice with a very poor prognosis.  Discharge Diagnoses: Active Problems:   Acute metabolic encephalopathy   AKI (acute kidney injury)   Hypercalcemia   Merkel cell carcinoma (HCC)   Persistent atrial fibrillation (HCC)   UTI (urinary tract infection)   Congestive heart failure with cardiomyopathy (HCC)   Cardiac pacemaker in situ   Essential hypertension   GERD without esophagitis   Type II diabetes mellitus (HCC)   Left humeral fracture   Dementia (HCC)   Hyperlipidemia   Pulmonary hypertension, moderate to severe (HCC)   Fall   Palliative care encounter   Protein-calorie malnutrition, severe   Altered mental status   Hospital Course: Partly taken from prior notes.  [name] [name] is a 89 y.o. year old female with medical history of hypertension, hyperlipidemia, type 2 diabetes, CHF (normal EF and 06/24), atrial fibrillation, status post pacemaker, MDD, GAD presenting to the ED with worsening somnolence.   On presentation vital stable, labs with mild leukocytosis, mild macrocytic anemia.  AKI, hypercalcemia.  Elevated troponin with a flat curve.  UA concerning for UTI-cultures were obtained.  proBNP elevated around 10K.  CT head was negative for any acute abnormality, chest x-ray with cardiomegaly and worsening pulmonary vascular congestion.  Patient was initially started on ceftriaxone  and was admitted for further evaluation.  CT chest was later obtained for further evaluation of abnormal chest x-ray which shows no concern of pneumonia, trace right pleural fluid but did show a large volume metastatic disease including liver, bones and likely lower thoracic nodes.  Pulmonary artery enlargement  suggesting pulmonary arterial hypertension.  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  Orthopedic surgery was also consulted for her recent left humeral fracture secondary to fall on 1/13 and they were recommending conservative management with sling and PT.  1/21: Vital stable, slight worsening of renal function with BUN 62, creatinine 2.49, urine cultures with Klebsiella pneumonia, only shows resistant to ampicillin.  Calcium of 10.4.  Anemia panel with no specific deficiency. Ordered parathyroid hormone and phosphorous. Giving some more IV fluid. Palliative care was also consulted.  Later family met with oncology and palliative care, they decided to transition her to full comfort care and home with hospice.  [facility] was also consulted.  Patient likely be going home with hospice tomorrow after equipments are delivered.  1/22: Remained hemodynamically stable.  Parathyroid and phosphorous levels were normal.  Patient remained lethargic with a very poor prognosis.  She is being discharged home according to the decision made by family with the help of hospice.  Equipments were delivered before the discharge.  She was given as needed medications for pain, agitation and nausea.  Assessment and Plan: Acute metabolic encephalopathy Likely multifactorial with AKI, extensive metastatic disease and hypercalcemia. Remained very lethargic and encephalopathic. - Family now decided to proceed with comfort care.  AKI (acute kidney injury) Worsening renal function despite getting some IV fluid. - Patient is now comfort care  Hypercalcemia Likely secondary to underlying extensive metastatic disease. Protein electrophoresis results are pending. Parathyroid level and phosphorous normal - Patient is now comfort care  Merkel cell carcinoma (HCC) CT abdomen and  pelvis with concern of extensive metastatic disease involving bone, adrenal, lymph node and liver.  Oncology met with family, patient is not a candidate for any further treatment due to very poor underlying functional status. -Now being switched to comfort care  UTI (urinary tract infection) Urine cultures grew Klebsiella pneumoniae and some E. coli, both sensitive to cefazolin .  She received ceftriaxone  while in the hospital and is being discharged on 4 more days of Keflex .  Persistent atrial fibrillation (HCC) Currently amiodarone  and metoprolol  is being held due to borderline bradycardia.  Congestive heart failure with cardiomyopathy (HCC) Concern of acute on chronic HFpEF on admission.  She was given IV Lasix  but due to worsening renal function it was stopped. - Monitor volume status -Can resume home torsemide  on discharge  Cardiac pacemaker in situ No acute concern. - Outpatient follow-up with cardiology  Essential hypertension Blood pressure currently borderline soft. - Continue to monitor  GERD without esophagitis - Continue with Protonix   Type II diabetes mellitus (HCC) - Continue with SSI  Left humeral fracture History of recent fall resulted in left femoral fracture which was evaluated by orthopedic surgery and they were recommending conservative management with sling and pain control. - Continue with sling and supportive care  Dementia [facility]) Patient with history of advanced dementia and was taking Aricept  at home which she can continue.  Protein-calorie malnutrition, severe Estimated body mass index is 23.28 kg/m as calculated from the following:   Height as of this encounter: 5' 3 (1.6 m).   Weight as of this encounter: 59.6 kg.   -Continue with dietary supplements if she is able to take     Pain control - [place]  Controlled Substance Reporting System database was reviewed. and patient was instructed, not to drive, operate heavy machinery, perform  activities at heights, swimming or participation in water activities or provide baby-sitting services while on Pain, Sleep and Anxiety Medications; until their outpatient Physician has advised to do so again. Also recommended to not to take more than prescribed Pain, Sleep and Anxiety Medications.  Consultants: Oncology.  Palliative care Procedures performed: None Disposition: Hospice care Diet recommendation:  Regular diet DISCHARGE MEDICATION: Allergies as of 01/10/2025       Reactions   Cyclobenzaprine Other (See Comments)   Dropped blood pressure and heart rate, talking out of my mind   Eliquis  [apixaban ] Other (See Comments)   RECURRENT FALLS,  WITH HEAD INJURIES (5 IN 5 MONTHS)   Iodine  Anaphylaxis   NO PROBLEMS WITH BETADINE    Biaxin [clarithromycin] Nausea And Vomiting   Codeine Nausea And Vomiting   Digoxin  And Related Other (See Comments)   Fatigue, eye puffiness, hoarsness   Diltiazem  Other (See Comments)   Enalapril Other (See Comments)   unknown   Famotidine     Caused dizziness   Fluocinonide Other (See Comments)   Tingling sensation in head and redness to scalp.   Iodinated Contrast Media Other (See Comments)   Tachycardia   Iodine -131    Losartan  Potassium Other (See Comments)   Dizzy and headache   Omnicef [cefdinir]    Oxycodone Nausea And Vomiting   Promethazine  Other (See Comments)   Unknown   Sertraline     Dizziness and weakness   Tizanidine Other (See Comments)   Other Reaction(s): Other (See Comments)    hypotension   Tizanidine Hcl Other (See Comments)   hypotension   Warfarin And Related    Bleeding   Xarelto  [rivaroxaban ]  Other (See Comments)   bleeding        Medication List     STOP taking these medications    amiodarone  200 MG tablet Commonly known as: PACERONE    amoxicillin  500 MG capsule Commonly known as: AMOXIL    metoprolol  succinate 25 MG 24 hr tablet Commonly known as: TOPROL -XL   pravastatin  20 MG tablet Commonly  known as: PRAVACHOL        TAKE these medications    acetaminophen  500 MG tablet Commonly known as: TYLENOL  Take 1,000 mg by mouth every 6 (six) hours as needed (pain).   cephALEXin  250 MG/5ML suspension Commonly known as: KEFLEX  Take 10 mLs (500 mg total) by mouth 3 (three) times daily for 4 days.   cholecalciferol  1000 units tablet Commonly known as: VITAMIN D  Take 1,000 Units by mouth daily.   cyanocobalamin  500 MCG tablet Commonly known as: VITAMIN B12 Take 500 mcg by mouth daily.   donepezil  10 MG disintegrating tablet Commonly known as: ARICEPT  ODT Take 1 tablet by mouth at bedtime.   feeding supplement Liqd Take 237 mLs by mouth 2 (two) times daily between meals. Start taking on: January 11, 2025   haloperidol  5 MG tablet Commonly known as: HALDOL  Take 1 tablet (5 mg total) by mouth every 4 (four) hours as needed for agitation. May crush, mix with water and give sublingually if needed.   LORazepam  0.5 MG tablet Commonly known as: ATIVAN  Take 1 tablet (0.5 mg total) by mouth every 4 (four) hours as needed for anxiety. May crush, mix with water and give sublingually if needed.   LUBRICATING EYE DROPS OP Place 1 drop into both eyes daily as needed (dry eyes).   morphine  20 MG/ML concentrated solution Commonly known as: ROXANOL Take 0.5 mLs (10 mg total) by mouth every 4 (four) hours as needed for severe pain (pain score 7-10), breakthrough pain or shortness of breath. May give sublingually if needed.   mupirocin  ointment 2 % Commonly known as: BACTROBAN  Apply 1 Application topically 2 (two) times daily. Apply to wound and cover with bandage until healed. What changed: additional instructions   ondansetron  4 MG disintegrating tablet Commonly known as: ZOFRAN -ODT Take 1 tablet (4 mg total) by mouth every 8 (eight) hours as needed for nausea or vomiting.   pantoprazole  40 MG tablet Commonly known as: PROTONIX  Take 1 tablet (40 mg total) by mouth daily.    potassium chloride  SA 20 MEQ tablet Commonly known as: KLOR-CON  M Take 1 tablet (20 mEq total) by mouth daily as needed. Take with torsemide  What changed:  when to take this reasons to take this   senna-docusate 8.6-50 MG tablet Commonly known as: Senokot-S Take 1 tablet by mouth at bedtime as needed for mild constipation.   sodium chloride  0.65 % Soln nasal spray Commonly known as: OCEAN Place 1 spray into both nostrils daily as needed for congestion.   torsemide  20 MG tablet Commonly known as: DEMADEX  Take 1 tablet (20 mg total) by mouth daily as needed (For swelling). What changed:  when to take this reasons to take this        Follow-up Information     [facility]. Go on 01/18/2025.   Specialty: Cardiology Why: Hospital follow-up 01/18/25 @ 9:00 AM Please [name] all medications to follow-up appointment [facility] , [address] Second [address] Free Valet Parking at the door. Contact information: [address] [place]  [address] [phone]  Discharge Exam: Filed Weights   01/07/25 1145 01/09/25 0500 01/10/25 0518  Weight: 63 kg 59.6 kg 60 kg   General.  Frail and malnourished elderly lady, in no acute distress. Pulmonary.  Lungs clear bilaterally, normal respiratory effort. CV.  Regular rate and rhythm, no JVD, rub or murmur. Abdomen.  Soft, nontender, nondistended, BS positive. CNS.  Awake but appears lethargic.  No focal neurologic deficit. Extremities.  No edema, pulses intact and symmetrical.  Condition at discharge: stable  The results of significant diagnostics from this hospitalization (including imaging, microbiology, ancillary and laboratory) are listed below for reference.   Imaging Studies: CT ABDOMEN PELVIS WO CONTRAST Result Date: 01/08/2025 CLINICAL DATA:  Abdominal pain, acute, nonlocalized Per previous chest CT, history of breast cancer and  neuroendocrine carcinoma of unknown origin. Multifocal metastatic disease. * Tracking Code: BO * EXAM: CT ABDOMEN AND PELVIS WITHOUT CONTRAST TECHNIQUE: Multidetector CT imaging of the abdomen and pelvis was performed following the standard protocol without IV contrast. RADIATION DOSE REDUCTION: This exam was performed according to the departmental dose-optimization program which includes automated exposure control, adjustment of the mA and/or kV according to patient size and/or use of iterative reconstruction technique. COMPARISON:  Abdominopelvic CT 10/17/2015.  Chest CT 01/08/2025. FINDINGS: Lower chest: Patchy dependent airspace opacities in both lung bases, similar to recent chest CT. Stable cardiomegaly and left subclavian pacemaker leads. There is aortic and coronary artery atherosclerosis. Hepatobiliary: As demonstrated on recent chest CT, there is widespread metastatic disease throughout the liver. There is a dominant mass within the left hepatic lobe measuring up to 12.9 x 9.3 cm. There are innumerable smaller lesions throughout the right lobe, including a lesion measuring 3.2 cm on image 23/2 and a 3.0 cm lesion on image 35/2. These liver lesions are new compared with the remote abdominal CT. Status post cholecystectomy without significant biliary dilatation. Pancreas: Pancreatic atrophy. Suspected surrounding retroperitoneal adenopathy without definite focal abnormality of the pancreas on this noncontrast study. Spleen: Normal in size without focal abnormality. Adrenals/Urinary Tract: Possible new ill-defined left adrenal nodule measuring 2.4 x 1.5 cm on image 28/2. The right adrenal gland demonstrates no significant findings. No evidence of urinary tract calculus or hydronephrosis. No focal renal masses are identified. The bladder appears unremarkable for its degree of distention. Stomach/Bowel: No enteric contrast administered. The stomach appears unremarkable for its degree of distension. No evidence  of bowel wall thickening, distention or surrounding inflammatory change. Diverticular changes in the distal colon. Vascular/Lymphatic: Assessment for nodal disease limited by lack of intravenous and enteric contrast. There is increased soft tissue thickening and nodularity throughout the retroperitoneum, suspicious for adenopathy. No evidence of significant pelvic adenopathy. Aortic and branch vessel atherosclerosis. Reproductive: Hysterectomy.  No evidence of adnexal mass. Other: Generalized soft tissue edema suspicious for anasarca. Small amount of ascites. No evidence of pneumoperitoneum. Musculoskeletal: Bilateral breast implants. Lytic lesion in the T12 vertebral body measuring up to 1.9 cm on image 23/2, consistent with metastatic disease. No evidence of pathologic fracture. Multilevel spondylosis. IMPRESSION: 1. Widespread metastatic disease throughout the liver, new compared with remote abdominal CT from 2016. 2. Suspected retroperitoneal adenopathy, suboptimally evaluated without intravenous and enteric contrast. 3. Possible new ill-defined left adrenal nodule, suspicious for metastatic disease. 4. Lytic lesion in the T12 vertebral body, consistent with metastatic disease. No evidence of pathologic fracture. 5. Patchy dependent airspace opacities in both lung bases, similar to recent chest CT. 6.  Aortic Atherosclerosis (ICD10-I70.0). Electronically Signed   By: Elsie Gertrude HERO.D.  On: 01/08/2025 15:39   CT CHEST WO CONTRAST Result Date: 01/08/2025 CLINICAL DATA:  Follow-up of pneumonia. Prior history includes breast cancer and neuroendocrine carcinoma of unknown origin. * Tracking Code: BO * EXAM: CT CHEST WITHOUT CONTRAST TECHNIQUE: Multidetector CT imaging of the chest was performed following the standard protocol without IV contrast. RADIATION DOSE REDUCTION: This exam was performed according to the departmental dose-optimization program which includes automated exposure control, adjustment of the  mA and/or kV according to patient size and/or use of iterative reconstruction technique. COMPARISON:  Chest radiograph of 01/07/2025.  No comparison CT. FINDINGS: Cardiovascular: Multifactorial degradation, including mild motion, arm position, EKG wires and lead artifacts. Aortic atherosclerosis. Tortuous thoracic aorta. Moderate to marked cardiomegaly. Right ventricular pacer. Multivessel coronary artery atherosclerosis. Pulmonary artery enlargement, outflow tract 3.4 cm. Mediastinum/Nodes: No axillary adenopathy. No mediastinal or hilar adenopathy, given limitations of unenhanced CT. Retrocrural adenopathy at 9 mm on image 120/2. Lungs/Pleura: Trace right pleural fluid. Bibasilar scarring and/or subsegmental atelectasis. No evidence of pneumonia. Upper Abdomen: Innumerable bilateral hepatic masses. Incompletely imaged dominant mass replacing the majority of the lateral segment left liver lobe at 10.8 x 9.0 cm on 154/2. Normal imaged portions of the spleen, stomach, pancreas. Cholecystectomy. Musculoskeletal: Bilateral breast implants with left-sided intracapsular rupture. Multifocal lytic lesions. Example within the right glenoid at 1.9 cm on image 25/2. Within the T12 vertebral body at 2.0 cm in sagittal image 79. IMPRESSION: 1. Multifactorial degradation, as detailed above. 2. No evidence of pneumonia.  Trace right pleural fluid. 3. Large volume metastatic disease, including to the liver, bones, and likely lower thoracic nodes. 4. Pulmonary artery enlargement suggests pulmonary arterial hypertension. 5. Cardiomegaly. Coronary artery atherosclerosis. Aortic Atherosclerosis (ICD10-I70.0). These results will be called to the ordering clinician or representative by the Radiologist Assistant, and communication documented in the PACS or [employer]. Electronically Signed   By: Rockey Kilts M.D.   On: 01/08/2025 14:16   DG Shoulder Left Port Result Date: 01/08/2025 CLINICAL DATA:  Proximal humerus fracture.  EXAM: LEFT SHOULDER COMPARISON:  Humerus radiograph 01/01/2025 FINDINGS: Slightly increased impaction of proximal humeral neck fracture. No significant peripheral callus formation. Glenohumeral alignment is unchanged. Acromioclavicular alignment is normal. IMPRESSION: Slightly increased impaction of proximal humeral neck fracture. Electronically Signed   By: Andrea Gasman M.D.   On: 01/08/2025 14:12   CT HEAD WO CONTRAST ( ) Result Date: 01/07/2025 EXAM: CT HEAD WITHOUT CONTRAST 01/07/2025 02:12:47 PM TECHNIQUE: CT of the head was performed without the administration of intravenous contrast. Automated exposure control, iterative reconstruction, and/or weight based adjustment of the mA/kV was utilized to reduce the radiation dose to as low as reasonably achievable. COMPARISON: Head CT 11/26/2024. CLINICAL HISTORY: fall 1 wk ago, worsening AMS FINDINGS: BRAIN AND VENTRICLES: There is no evidence of an acute infarct, intracranial hemorrhage, mass, midline shift, hydrocephalus, or extra-axial fluid collection. There is mild cerebral atrophy. Cerebral white matter hypodensities are unchanged and nonspecific but compatible with mild chronic small vessel ischemic disease. ORBITS: Bilateral cataract extraction. SINUSES: Small mucous retention cyst in the right maxillary sinus. Small right and trace left mastoid effusions. SOFT TISSUES AND SKULL: No acute soft tissue abnormality. No skull fracture. IMPRESSION: 1. No acute intracranial abnormality. 2. Mild chronic small vessel ischemic disease. Electronically signed by: Dasie Hamburg MD 01/07/2025 02:19 PM EST RP Workstation: [id number]   DG Chest 2 View Result Date: 01/07/2025 CLINICAL DATA:  Chest pain.  Hypertension.  Atrial fibrillation. EXAM: CHEST - 2 VIEW COMPARISON:  11/26/2024 FINDINGS: Unchanged mild cardiomegaly.  Interval development of mild pulmonary vascular congestion. Mild bibasilar opacities likely due to atelectasis. Single lead LEFT chest wall  pacemaker is unchanged in configuration. IMPRESSION: Mild cardiomegaly with interval worsening of pulmonary vascular congestion suspicious for CHF/fluid volume overload. Electronically Signed   By: Aliene Lloyd M.D.   On: 01/07/2025 12:34   DG Humerus Left Result Date: 01/01/2025 EXAM: 1 VIEW XRAY OF THE LEFT HUMERUS 01/01/2025 12:49:00 PM COMPARISON: None available. CLINICAL HISTORY: The patient experienced a fall. FINDINGS: BONES AND JOINTS: Cortical irregularity of the anatomic neck of the humerus, consistent with an acute fracture. Osteopenia. No malalignment. SOFT TISSUES: Unremarkable. IMPRESSION: 1. Osteopenia. Findings worrisome for a minimally displaced fracture of the anatomic neck of the humerus. Electronically signed by: Rogelia Myers MD MD 01/01/2025 01:26 PM EST RP Workstation: [id number]   DG Forearm Left Result Date: 01/01/2025 CLINICAL DATA:  Bilateral arm pain after fall EXAM: LEFT FOREARM - 2 VIEW COMPARISON:  None Available. FINDINGS: There is no evidence of fracture or other focal bone lesions. Soft tissues are unremarkable. IMPRESSION: Negative. Electronically Signed   By: Lynwood Landy Raddle M.D.   On: 01/01/2025 11:53    Microbiology: Results for orders placed or performed during the hospital encounter of 01/07/25  Resp panel by RT-PCR (RSV, Flu A&B, Covid) Anterior Nasal Swab     Status: None   Collection Time: 01/07/25  1:12 PM   Specimen: Anterior Nasal Swab  Result Value Ref Range Status   SARS Coronavirus 2 by RT PCR NEGATIVE NEGATIVE Final    Comment: (NOTE) SARS-CoV-2 target nucleic acids are NOT DETECTED.  The SARS-CoV-2 RNA is generally detectable in upper respiratory specimens during the acute phase of infection. The lowest concentration of SARS-CoV-2 viral copies this assay can detect is 138 copies/mL. A negative result does not preclude SARS-Cov-2 infection and should not be used as the sole basis for treatment or other patient management decisions. A negative  result may occur with  improper specimen collection/handling, submission of specimen other than nasopharyngeal swab, presence of viral mutation(s) within the areas targeted by this assay, and inadequate number of viral copies(<138 copies/mL). A negative result must be combined with clinical observations, patient history, and epidemiological information. The expected result is Negative.  Fact Sheet for Patients:  bloggercourse.com  Fact Sheet for Healthcare Providers:  seriousbroker.it  This test is no t yet approved or cleared by the [place]  FDA and  has been authorized for detection and/or diagnosis of SARS-CoV-2 by FDA under an Emergency Use Authorization (EUA). This EUA will remain  in effect (meaning this test can be used) for the duration of the COVID-19 declaration under Section 564(b)(1) of the Act, 21 U.S.C.section 360bbb-3(b)(1), unless the authorization is terminated  or revoked sooner.       Influenza A by PCR NEGATIVE NEGATIVE Final   Influenza B by PCR NEGATIVE NEGATIVE Final    Comment: (NOTE) The Xpert Xpress SARS-CoV-2/FLU/RSV plus assay is intended as an aid in the diagnosis of influenza from Nasopharyngeal swab specimens and should not be used as a sole basis for treatment. Nasal washings and aspirates are unacceptable for Xpert Xpress SARS-CoV-2/FLU/RSV testing.  Fact Sheet for Patients: bloggercourse.com  Fact Sheet for Healthcare Providers: seriousbroker.it  This test is not yet approved or cleared by the [place]  FDA and has been authorized for detection and/or diagnosis of SARS-CoV-2 by FDA under an Emergency Use Authorization (EUA). This EUA will remain in effect (meaning this test can be used) for the duration  of the COVID-19 declaration under Section 564(b)(1) of the Act, 21 U.S.C. section 360bbb-3(b)(1), unless the authorization is  terminated or revoked.     Resp Syncytial Virus by PCR NEGATIVE NEGATIVE Final    Comment: (NOTE) Fact Sheet for Patients: bloggercourse.com  Fact Sheet for Healthcare Providers: seriousbroker.it  This test is not yet approved or cleared by the [place]  FDA and has been authorized for detection and/or diagnosis of SARS-CoV-2 by FDA under an Emergency Use Authorization (EUA). This EUA will remain in effect (meaning this test can be used) for the duration of the COVID-19 declaration under Section 564(b)(1) of the Act, 21 U.S.C. section 360bbb-3(b)(1), unless the authorization is terminated or revoked.  Performed at [facility], [address], [place], [place] [address]   Urine Culture (for pregnant, neutropenic or urologic patients or patients with an indwelling urinary catheter)     Status: Abnormal   Collection Time: 01/07/25  2:44 PM   Specimen: Urine, Clean Catch  Result Value Ref Range Status   Specimen Description   Final    URINE, CLEAN CATCH Performed at [facility], [address] [place], [place] [address]    Special Requests   Final    NONE Performed at [facility], [address] [place], [place] [address]    Culture (A)  Final    >=100,000 COLONIES/mL KLEBSIELLA PNEUMONIAE 20,000 COLONIES/mL ESCHERICHIA COLI    Report Status 01/10/2025 FINAL  Final   Organism ID, Bacteria KLEBSIELLA PNEUMONIAE (A)  Final   Organism ID, Bacteria ESCHERICHIA COLI (A)  Final      Susceptibility   Escherichia coli - MIC*    AMPICILLIN 4 SENSITIVE Sensitive     CEFAZOLIN  (URINE) Value in next row Sensitive      <=1 SENSITIVEThis is a modified FDA-approved test that has been validated and its performance characteristics determined by the reporting laboratory.  This laboratory is certified under the Clinical Laboratory Improvement Amendments CLIA as qualified to perform high complexity clinical  laboratory testing.    CEFEPIME Value in next row Sensitive      <=1 SENSITIVEThis is a modified FDA-approved test that has been validated and its performance characteristics determined by the reporting laboratory.  This laboratory is certified under the Clinical Laboratory Improvement Amendments CLIA as qualified to perform high complexity clinical laboratory testing.    ERTAPENEM Value in next row Sensitive      <=1 SENSITIVEThis is a modified FDA-approved test that has been validated and its performance characteristics determined by the reporting laboratory.  This laboratory is certified under the Clinical Laboratory Improvement Amendments CLIA as qualified to perform high complexity clinical laboratory testing.    CEFTRIAXONE  Value in next row Sensitive      <=1 SENSITIVEThis is a modified FDA-approved test that has been validated and its performance characteristics determined by the reporting laboratory.  This laboratory is certified under the Clinical Laboratory Improvement Amendments CLIA as qualified to perform high complexity clinical laboratory testing.    CIPROFLOXACIN  Value in next row Sensitive      <=1 SENSITIVEThis is a modified FDA-approved test that has been validated and its performance characteristics determined by the reporting laboratory.  This laboratory is certified under the Clinical Laboratory Improvement Amendments CLIA as qualified to perform high complexity clinical laboratory testing.    GENTAMICIN  Value in next row Sensitive      <=1 SENSITIVEThis is a modified FDA-approved test that has been validated and its performance characteristics determined by the  reporting laboratory.  This laboratory is certified under the Clinical Laboratory Improvement Amendments CLIA as qualified to perform high complexity clinical laboratory testing.    NITROFURANTOIN  Value in next row Sensitive      <=1 SENSITIVEThis is a modified FDA-approved test that has been validated and its performance  characteristics determined by the reporting laboratory.  This laboratory is certified under the Clinical Laboratory Improvement Amendments CLIA as qualified to perform high complexity clinical laboratory testing.    TRIMETH/SULFA Value in next row Sensitive      <=1 SENSITIVEThis is a modified FDA-approved test that has been validated and its performance characteristics determined by the reporting laboratory.  This laboratory is certified under the Clinical Laboratory Improvement Amendments CLIA as qualified to perform high complexity clinical laboratory testing.    AMPICILLIN/SULBACTAM Value in next row Sensitive      <=1 SENSITIVEThis is a modified FDA-approved test that has been validated and its performance characteristics determined by the reporting laboratory.  This laboratory is certified under the Clinical Laboratory Improvement Amendments CLIA as qualified to perform high complexity clinical laboratory testing.    PIP/TAZO Value in next row Sensitive      <=4 SENSITIVEThis is a modified FDA-approved test that has been validated and its performance characteristics determined by the reporting laboratory.  This laboratory is certified under the Clinical Laboratory Improvement Amendments CLIA as qualified to perform high complexity clinical laboratory testing.    MEROPENEM Value in next row Sensitive      <=4 SENSITIVEThis is a modified FDA-approved test that has been validated and its performance characteristics determined by the reporting laboratory.  This laboratory is certified under the Clinical Laboratory Improvement Amendments CLIA as qualified to perform high complexity clinical laboratory testing.    * 20,000 COLONIES/mL ESCHERICHIA COLI   Klebsiella pneumoniae - MIC*    AMPICILLIN Value in next row Resistant      <=4 SENSITIVEThis is a modified FDA-approved test that has been validated and its performance characteristics determined by the reporting laboratory.  This laboratory is certified  under the Clinical Laboratory Improvement Amendments CLIA as qualified to perform high complexity clinical laboratory testing.    CEFAZOLIN  (URINE) Value in next row Sensitive      2 SENSITIVEThis is a modified FDA-approved test that has been validated and its performance characteristics determined by the reporting laboratory.  This laboratory is certified under the Clinical Laboratory Improvement Amendments CLIA as qualified to perform high complexity clinical laboratory testing.    CEFEPIME Value in next row Sensitive      2 SENSITIVEThis is a modified FDA-approved test that has been validated and its performance characteristics determined by the reporting laboratory.  This laboratory is certified under the Clinical Laboratory Improvement Amendments CLIA as qualified to perform high complexity clinical laboratory testing.    ERTAPENEM Value in next row Sensitive      2 SENSITIVEThis is a modified FDA-approved test that has been validated and its performance characteristics determined by the reporting laboratory.  This laboratory is certified under the Clinical Laboratory Improvement Amendments CLIA as qualified to perform high complexity clinical laboratory testing.    CEFTRIAXONE  Value in next row Sensitive      2 SENSITIVEThis is a modified FDA-approved test that has been validated and its performance characteristics determined by the reporting laboratory.  This laboratory is certified under the Clinical Laboratory Improvement Amendments CLIA as qualified to perform high complexity clinical laboratory testing.    CIPROFLOXACIN  Value in next row Sensitive  2 SENSITIVEThis is a modified FDA-approved test that has been validated and its performance characteristics determined by the reporting laboratory.  This laboratory is certified under the Clinical Laboratory Improvement Amendments CLIA as qualified to perform high complexity clinical laboratory testing.    GENTAMICIN  Value in next row Sensitive       2 SENSITIVEThis is a modified FDA-approved test that has been validated and its performance characteristics determined by the reporting laboratory.  This laboratory is certified under the Clinical Laboratory Improvement Amendments CLIA as qualified to perform high complexity clinical laboratory testing.    NITROFURANTOIN  Value in next row Sensitive      2 SENSITIVEThis is a modified FDA-approved test that has been validated and its performance characteristics determined by the reporting laboratory.  This laboratory is certified under the Clinical Laboratory Improvement Amendments CLIA as qualified to perform high complexity clinical laboratory testing.    TRIMETH/SULFA Value in next row Sensitive      2 SENSITIVEThis is a modified FDA-approved test that has been validated and its performance characteristics determined by the reporting laboratory.  This laboratory is certified under the Clinical Laboratory Improvement Amendments CLIA as qualified to perform high complexity clinical laboratory testing.    AMPICILLIN/SULBACTAM Value in next row Sensitive      2 SENSITIVEThis is a modified FDA-approved test that has been validated and its performance characteristics determined by the reporting laboratory.  This laboratory is certified under the Clinical Laboratory Improvement Amendments CLIA as qualified to perform high complexity clinical laboratory testing.    PIP/TAZO Value in next row Sensitive      <=4 SENSITIVEThis is a modified FDA-approved test that has been validated and its performance characteristics determined by the reporting laboratory.  This laboratory is certified under the Clinical Laboratory Improvement Amendments CLIA as qualified to perform high complexity clinical laboratory testing.    MEROPENEM Value in next row Sensitive      <=4 SENSITIVEThis is a modified FDA-approved test that has been validated and its performance characteristics determined by the reporting laboratory.  This  laboratory is certified under the Clinical Laboratory Improvement Amendments CLIA as qualified to perform high complexity clinical laboratory testing.    * >=100,000 COLONIES/mL KLEBSIELLA PNEUMONIAE   *Note: Due to a large number of results and/or encounters for the requested time period, some results have not been displayed. A complete set of results can be found in Results Review.    Labs: CBC: Recent Labs  Lab 01/07/25 1147 01/08/25 0254  WBC 11.0* 11.9*  HGB 11.5* 11.5*  HCT 35.4* 35.2*  MCV 102.0* 100.3*  PLT 182 177   Basic Metabolic Panel: Recent Labs  Lab 01/07/25 1147 01/07/25 1326 01/07/25 2224 01/08/25 0254 01/09/25 0433 01/09/25 0956 01/10/25 0342  NA 138  --  140 139 142  --   --   K 5.1  --  4.7 4.8 4.6  --   --   CL 99  --  100 99 104  --   --   CO2 24  --  25 24 24   --   --   GLUCOSE 178*  --  121* 112* 126*  --   --   BUN 51*  --  52* 55* 62*  --   --   CREATININE 2.35*  --  2.25* 2.40* 2.49*  --   --   CALCIUM 10.5*  --  10.7* 10.6* 10.4* 9.9  --   MG  --  2.6*  --   --   --   --   --  PHOS  --   --   --   --   --   --  3.3   Liver Function Tests: No results for input(s): AST, ALT, ALKPHOS, BILITOT, PROT, ALBUMIN in the last 168 hours. CBG: Recent Labs  Lab 01/09/25 1120 01/09/25 1558 01/09/25 2012 01/10/25 0848 01/10/25 1202  GLUCAP 85 123* 101* 110* 145*    Discharge time spent: greater than 30 minutes.  This record has been created using Conservation officer, historic buildings. Errors have been sought and corrected,but may not always be located. Such creation errors do not reflect on the standard of care.   Signed: Amaryllis Dare, MD [facility] 01/10/2025 "

## 2025-01-11 ENCOUNTER — Telehealth: Payer: Self-pay | Admitting: *Deleted

## 2025-01-11 NOTE — Transitions of Care (Post Inpatient/ED Visit) (Signed)
" ° °  01/11/2025  Name: [name] [name] MRN: [id number] DOB: [birth year]  Today's TOC FU Call Status: Today's TOC FU Call Status:: Successful TOC FU Call Completed TOC FU Call Complete Date: 01/11/25  Patient's Name and Date of Birth confirmed. DOB, Name (Spoke with stepson/DPR [name]))  Transition Care Management Follow-up Telephone Call Date of Discharge: 01/10/25 Discharge Facility: [facility]) Type of Discharge: Inpatient Admission Primary Inpatient Discharge Diagnosis:: Acute metabolic encephalopathy  RN made outreach to patient/family and confirmed that hospice services are in place.  Patient/family aware to contact hospice 24/7 for any questions or concerns. No further interventions at this time.      Olam Ku, RN, BSN [place]  [facility], [facility] RN Care Manager Direct Dial: [phone]  Fax: [phone]   "

## 2025-01-11 NOTE — Patient Outreach (Signed)
 RNCM - spoke with husband, confirmed patient home from hospital with Hospice and notified RNCM will no longer be following patient. No questions. PCP notified.

## 2025-01-17 ENCOUNTER — Ambulatory Visit: Admitting: Dermatology

## 2025-01-18 ENCOUNTER — Ambulatory Visit: Admitting: Family

## 2025-01-20 DEATH — deceased

## 2025-02-19 ENCOUNTER — Ambulatory Visit: Admitting: Podiatry

## 2025-03-26 ENCOUNTER — Ambulatory Visit: Payer: Self-pay | Admitting: Physician Assistant
